# Patient Record
Sex: Male | Born: 1950
Health system: Southern US, Community
[De-identification: ages and names within clinical notes are randomized; demographics above are authoritative.]

## PROBLEM LIST (undated history)

## (undated) ENCOUNTER — Ambulatory Visit (HOSPITAL_COMMUNITY): Discharge status: Absent without leave | Payer: Medicare Other

## (undated) DIAGNOSIS — E7800 Pure hypercholesterolemia, unspecified: Secondary | ICD-10-CM

## (undated) DIAGNOSIS — Z9289 Personal history of other medical treatment: Secondary | ICD-10-CM

## (undated) DIAGNOSIS — I10 Essential (primary) hypertension: Secondary | ICD-10-CM

## (undated) DIAGNOSIS — K219 Gastro-esophageal reflux disease without esophagitis: Secondary | ICD-10-CM

## (undated) DIAGNOSIS — S92351A Displaced fracture of fifth metatarsal bone, right foot, initial encounter for closed fracture: Secondary | ICD-10-CM

## (undated) DIAGNOSIS — F0390 Unspecified dementia without behavioral disturbance: Secondary | ICD-10-CM

## (undated) DIAGNOSIS — F200 Paranoid schizophrenia: Secondary | ICD-10-CM

## (undated) DIAGNOSIS — N4289 Other specified disorders of prostate: Secondary | ICD-10-CM

## (undated) DIAGNOSIS — N400 Enlarged prostate without lower urinary tract symptoms: Secondary | ICD-10-CM

## (undated) DIAGNOSIS — K635 Polyp of colon: Secondary | ICD-10-CM

## (undated) DIAGNOSIS — B1920 Unspecified viral hepatitis C without hepatic coma: Secondary | ICD-10-CM

## (undated) DIAGNOSIS — F329 Major depressive disorder, single episode, unspecified: Secondary | ICD-10-CM

## (undated) DIAGNOSIS — F32A Depression, unspecified: Secondary | ICD-10-CM

## (undated) DIAGNOSIS — H348192 Central retinal vein occlusion, unspecified eye, stable: Secondary | ICD-10-CM

## (undated) DIAGNOSIS — T148XXA Other injury of unspecified body region, initial encounter: Secondary | ICD-10-CM

## (undated) HISTORY — PX: TONSILLECTOMY: SUR1361

## (undated) HISTORY — DX: Major depressive disorder, single episode, unspecified: F32.9

## (undated) HISTORY — DX: Unspecified viral hepatitis C without hepatic coma: B19.20

## (undated) HISTORY — DX: Benign prostatic hyperplasia without lower urinary tract symptoms: N40.0

## (undated) HISTORY — DX: Depression, unspecified: F32.A

## (undated) HISTORY — DX: Essential (primary) hypertension: I10

## (undated) HISTORY — DX: Polyp of colon: K63.5

## (undated) HISTORY — DX: Central retinal vein occlusion, unspecified eye, stable: H34.8192

---
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
## 1998-07-24 ENCOUNTER — Inpatient Hospital Stay (HOSPITAL_COMMUNITY): Admission: AD | Admit: 1998-07-24 | Discharge: 1998-07-26 | Payer: Self-pay | Admitting: *Deleted

## 1999-07-25 ENCOUNTER — Emergency Department (HOSPITAL_COMMUNITY): Admission: EM | Admit: 1999-07-25 | Discharge: 1999-07-25 | Payer: Self-pay | Admitting: Emergency Medicine

## 1999-07-26 ENCOUNTER — Encounter: Payer: Self-pay | Admitting: Emergency Medicine

## 1999-08-12 ENCOUNTER — Emergency Department (HOSPITAL_COMMUNITY): Admission: EM | Admit: 1999-08-12 | Discharge: 1999-08-12 | Payer: Self-pay | Admitting: Emergency Medicine

## 1999-10-06 ENCOUNTER — Encounter: Payer: Self-pay | Admitting: Emergency Medicine

## 1999-10-06 ENCOUNTER — Emergency Department (HOSPITAL_COMMUNITY): Admission: EM | Admit: 1999-10-06 | Discharge: 1999-10-06 | Payer: Self-pay | Admitting: *Deleted

## 2002-03-12 ENCOUNTER — Emergency Department (HOSPITAL_COMMUNITY): Admission: EM | Admit: 2002-03-12 | Discharge: 2002-03-12 | Payer: Self-pay | Admitting: *Deleted

## 2002-05-29 ENCOUNTER — Emergency Department (HOSPITAL_COMMUNITY): Admission: EM | Admit: 2002-05-29 | Discharge: 2002-05-29 | Payer: Self-pay | Admitting: Emergency Medicine

## 2002-05-31 ENCOUNTER — Emergency Department (HOSPITAL_COMMUNITY): Admission: EM | Admit: 2002-05-31 | Discharge: 2002-05-31 | Payer: Self-pay | Admitting: Emergency Medicine

## 2002-06-13 ENCOUNTER — Emergency Department (HOSPITAL_COMMUNITY): Admission: EM | Admit: 2002-06-13 | Discharge: 2002-06-13 | Payer: Self-pay | Admitting: *Deleted

## 2002-07-22 ENCOUNTER — Emergency Department (HOSPITAL_COMMUNITY): Admission: EM | Admit: 2002-07-22 | Discharge: 2002-07-22 | Payer: Self-pay | Admitting: Emergency Medicine

## 2002-07-29 ENCOUNTER — Emergency Department (HOSPITAL_COMMUNITY): Admission: EM | Admit: 2002-07-29 | Discharge: 2002-07-29 | Payer: Self-pay | Admitting: Emergency Medicine

## 2002-08-06 ENCOUNTER — Emergency Department (HOSPITAL_COMMUNITY): Admission: EM | Admit: 2002-08-06 | Discharge: 2002-08-06 | Payer: Self-pay | Admitting: Emergency Medicine

## 2002-08-13 ENCOUNTER — Emergency Department (HOSPITAL_COMMUNITY): Admission: EM | Admit: 2002-08-13 | Discharge: 2002-08-13 | Payer: Self-pay | Admitting: Emergency Medicine

## 2002-08-26 ENCOUNTER — Emergency Department (HOSPITAL_COMMUNITY): Admission: EM | Admit: 2002-08-26 | Discharge: 2002-08-26 | Payer: Self-pay

## 2002-09-05 ENCOUNTER — Emergency Department (HOSPITAL_COMMUNITY): Admission: EM | Admit: 2002-09-05 | Discharge: 2002-09-05 | Payer: Self-pay | Admitting: Emergency Medicine

## 2002-09-09 ENCOUNTER — Emergency Department (HOSPITAL_COMMUNITY): Admission: EM | Admit: 2002-09-09 | Discharge: 2002-09-10 | Payer: Self-pay

## 2002-09-11 ENCOUNTER — Emergency Department (HOSPITAL_COMMUNITY): Admission: EM | Admit: 2002-09-11 | Discharge: 2002-09-11 | Payer: Self-pay | Admitting: Emergency Medicine

## 2002-09-13 ENCOUNTER — Emergency Department (HOSPITAL_COMMUNITY): Admission: EM | Admit: 2002-09-13 | Discharge: 2002-09-13 | Payer: Self-pay | Admitting: *Deleted

## 2002-09-13 ENCOUNTER — Encounter: Payer: Self-pay | Admitting: *Deleted

## 2002-09-14 ENCOUNTER — Emergency Department (HOSPITAL_COMMUNITY): Admission: EM | Admit: 2002-09-14 | Discharge: 2002-09-14 | Payer: Self-pay | Admitting: Emergency Medicine

## 2002-09-19 ENCOUNTER — Emergency Department (HOSPITAL_COMMUNITY): Admission: EM | Admit: 2002-09-19 | Discharge: 2002-09-19 | Payer: Self-pay | Admitting: Emergency Medicine

## 2002-10-09 ENCOUNTER — Emergency Department (HOSPITAL_COMMUNITY): Admission: EM | Admit: 2002-10-09 | Discharge: 2002-10-09 | Payer: Self-pay | Admitting: Emergency Medicine

## 2002-10-18 ENCOUNTER — Emergency Department (HOSPITAL_COMMUNITY): Admission: EM | Admit: 2002-10-18 | Discharge: 2002-10-18 | Payer: Self-pay

## 2002-10-24 ENCOUNTER — Emergency Department (HOSPITAL_COMMUNITY): Admission: EM | Admit: 2002-10-24 | Discharge: 2002-10-24 | Payer: Self-pay | Admitting: Emergency Medicine

## 2002-12-20 ENCOUNTER — Emergency Department (HOSPITAL_COMMUNITY): Admission: EM | Admit: 2002-12-20 | Discharge: 2002-12-20 | Payer: Self-pay | Admitting: Emergency Medicine

## 2004-12-18 ENCOUNTER — Emergency Department (HOSPITAL_COMMUNITY): Admission: EM | Admit: 2004-12-18 | Discharge: 2004-12-18 | Payer: Self-pay | Admitting: Emergency Medicine

## 2004-12-20 ENCOUNTER — Emergency Department (HOSPITAL_COMMUNITY): Admission: EM | Admit: 2004-12-20 | Discharge: 2004-12-20 | Payer: Self-pay | Admitting: Emergency Medicine

## 2004-12-21 ENCOUNTER — Emergency Department (HOSPITAL_COMMUNITY): Admission: EM | Admit: 2004-12-21 | Discharge: 2004-12-21 | Payer: Self-pay | Admitting: Emergency Medicine

## 2004-12-22 ENCOUNTER — Emergency Department (HOSPITAL_COMMUNITY): Admission: EM | Admit: 2004-12-22 | Discharge: 2004-12-22 | Payer: Self-pay | Admitting: Emergency Medicine

## 2004-12-23 ENCOUNTER — Emergency Department (HOSPITAL_COMMUNITY): Admission: EM | Admit: 2004-12-23 | Discharge: 2004-12-23 | Payer: Self-pay | Admitting: Emergency Medicine

## 2004-12-25 ENCOUNTER — Emergency Department (HOSPITAL_COMMUNITY): Admission: EM | Admit: 2004-12-25 | Discharge: 2004-12-25 | Payer: Self-pay

## 2004-12-26 ENCOUNTER — Emergency Department (HOSPITAL_COMMUNITY): Admission: EM | Admit: 2004-12-26 | Discharge: 2004-12-26 | Payer: Self-pay | Admitting: *Deleted

## 2004-12-29 ENCOUNTER — Emergency Department (HOSPITAL_COMMUNITY): Admission: EM | Admit: 2004-12-29 | Discharge: 2004-12-29 | Payer: Self-pay | Admitting: Emergency Medicine

## 2005-01-04 ENCOUNTER — Emergency Department (HOSPITAL_COMMUNITY): Admission: EM | Admit: 2005-01-04 | Discharge: 2005-01-04 | Payer: Self-pay | Admitting: Emergency Medicine

## 2005-01-06 ENCOUNTER — Emergency Department (HOSPITAL_COMMUNITY): Admission: EM | Admit: 2005-01-06 | Discharge: 2005-01-06 | Payer: Self-pay | Admitting: Emergency Medicine

## 2005-01-17 ENCOUNTER — Emergency Department (HOSPITAL_COMMUNITY): Admission: EM | Admit: 2005-01-17 | Discharge: 2005-01-18 | Payer: Self-pay | Admitting: Emergency Medicine

## 2005-01-26 ENCOUNTER — Emergency Department (HOSPITAL_COMMUNITY): Admission: EM | Admit: 2005-01-26 | Discharge: 2005-01-26 | Payer: Self-pay | Admitting: Emergency Medicine

## 2005-02-04 ENCOUNTER — Emergency Department (HOSPITAL_COMMUNITY): Admission: EM | Admit: 2005-02-04 | Discharge: 2005-02-05 | Payer: Self-pay | Admitting: Emergency Medicine

## 2005-02-10 ENCOUNTER — Emergency Department (HOSPITAL_COMMUNITY): Admission: EM | Admit: 2005-02-10 | Discharge: 2005-02-10 | Payer: Self-pay | Admitting: Emergency Medicine

## 2005-02-20 ENCOUNTER — Emergency Department (HOSPITAL_COMMUNITY): Admission: EM | Admit: 2005-02-20 | Discharge: 2005-02-20 | Payer: Self-pay | Admitting: Emergency Medicine

## 2005-02-21 ENCOUNTER — Emergency Department (HOSPITAL_COMMUNITY): Admission: EM | Admit: 2005-02-21 | Discharge: 2005-02-21 | Payer: Self-pay | Admitting: Emergency Medicine

## 2005-02-22 ENCOUNTER — Emergency Department (HOSPITAL_COMMUNITY): Admission: EM | Admit: 2005-02-22 | Discharge: 2005-02-22 | Payer: Self-pay | Admitting: Emergency Medicine

## 2005-03-03 ENCOUNTER — Emergency Department (HOSPITAL_COMMUNITY): Admission: EM | Admit: 2005-03-03 | Discharge: 2005-03-03 | Payer: Self-pay | Admitting: Advanced Practice Midwife

## 2005-03-12 ENCOUNTER — Emergency Department (HOSPITAL_COMMUNITY): Admission: EM | Admit: 2005-03-12 | Discharge: 2005-03-12 | Payer: Self-pay | Admitting: Emergency Medicine

## 2005-03-13 ENCOUNTER — Emergency Department (HOSPITAL_COMMUNITY): Admission: EM | Admit: 2005-03-13 | Discharge: 2005-03-14 | Payer: Self-pay | Admitting: Emergency Medicine

## 2005-03-21 ENCOUNTER — Emergency Department (HOSPITAL_COMMUNITY): Admission: EM | Admit: 2005-03-21 | Discharge: 2005-03-22 | Payer: Self-pay | Admitting: Emergency Medicine

## 2005-04-03 ENCOUNTER — Emergency Department (HOSPITAL_COMMUNITY): Admission: EM | Admit: 2005-04-03 | Discharge: 2005-04-04 | Payer: Self-pay | Admitting: Emergency Medicine

## 2005-04-14 ENCOUNTER — Emergency Department (HOSPITAL_COMMUNITY): Admission: EM | Admit: 2005-04-14 | Discharge: 2005-04-15 | Payer: Self-pay | Admitting: Emergency Medicine

## 2005-06-16 ENCOUNTER — Emergency Department (HOSPITAL_COMMUNITY): Admission: EM | Admit: 2005-06-16 | Discharge: 2005-06-16 | Payer: Self-pay | Admitting: Emergency Medicine

## 2005-09-07 ENCOUNTER — Emergency Department (HOSPITAL_COMMUNITY): Admission: EM | Admit: 2005-09-07 | Discharge: 2005-09-07 | Payer: Self-pay | Admitting: Emergency Medicine

## 2005-09-09 ENCOUNTER — Emergency Department (HOSPITAL_COMMUNITY): Admission: EM | Admit: 2005-09-09 | Discharge: 2005-09-09 | Payer: Self-pay | Admitting: Emergency Medicine

## 2005-09-18 ENCOUNTER — Emergency Department (HOSPITAL_COMMUNITY): Admission: EM | Admit: 2005-09-18 | Discharge: 2005-09-18 | Payer: Self-pay | Admitting: Emergency Medicine

## 2005-09-22 ENCOUNTER — Emergency Department (HOSPITAL_COMMUNITY): Admission: EM | Admit: 2005-09-22 | Discharge: 2005-09-22 | Payer: Self-pay | Admitting: Emergency Medicine

## 2005-09-23 ENCOUNTER — Emergency Department (HOSPITAL_COMMUNITY): Admission: EM | Admit: 2005-09-23 | Discharge: 2005-09-24 | Payer: Self-pay | Admitting: Emergency Medicine

## 2005-09-27 ENCOUNTER — Emergency Department (HOSPITAL_COMMUNITY): Admission: EM | Admit: 2005-09-27 | Discharge: 2005-09-28 | Payer: Self-pay | Admitting: Emergency Medicine

## 2005-09-29 ENCOUNTER — Emergency Department (HOSPITAL_COMMUNITY): Admission: EM | Admit: 2005-09-29 | Discharge: 2005-09-29 | Payer: Self-pay | Admitting: Emergency Medicine

## 2005-10-05 ENCOUNTER — Emergency Department (HOSPITAL_COMMUNITY): Admission: EM | Admit: 2005-10-05 | Discharge: 2005-10-06 | Payer: Self-pay | Admitting: Emergency Medicine

## 2005-10-06 ENCOUNTER — Emergency Department (HOSPITAL_COMMUNITY): Admission: EM | Admit: 2005-10-06 | Discharge: 2005-10-06 | Payer: Self-pay | Admitting: Emergency Medicine

## 2005-10-07 ENCOUNTER — Inpatient Hospital Stay (HOSPITAL_COMMUNITY): Admission: EM | Admit: 2005-10-07 | Discharge: 2005-10-08 | Payer: Self-pay | Admitting: Emergency Medicine

## 2005-10-13 ENCOUNTER — Emergency Department (HOSPITAL_COMMUNITY): Admission: EM | Admit: 2005-10-13 | Discharge: 2005-10-13 | Payer: Self-pay | Admitting: Emergency Medicine

## 2005-10-15 ENCOUNTER — Emergency Department (HOSPITAL_COMMUNITY): Admission: EM | Admit: 2005-10-15 | Discharge: 2005-10-15 | Payer: Self-pay | Admitting: Emergency Medicine

## 2005-10-25 ENCOUNTER — Emergency Department (HOSPITAL_COMMUNITY): Admission: EM | Admit: 2005-10-25 | Discharge: 2005-10-26 | Payer: Self-pay | Admitting: Emergency Medicine

## 2005-10-28 ENCOUNTER — Emergency Department (HOSPITAL_COMMUNITY): Admission: EM | Admit: 2005-10-28 | Discharge: 2005-10-28 | Payer: Self-pay | Admitting: Emergency Medicine

## 2005-10-30 ENCOUNTER — Emergency Department (HOSPITAL_COMMUNITY): Admission: EM | Admit: 2005-10-30 | Discharge: 2005-10-31 | Payer: Self-pay | Admitting: Emergency Medicine

## 2005-10-30 ENCOUNTER — Emergency Department (HOSPITAL_COMMUNITY): Admission: EM | Admit: 2005-10-30 | Discharge: 2005-10-30 | Payer: Self-pay | Admitting: Emergency Medicine

## 2005-11-19 ENCOUNTER — Emergency Department (HOSPITAL_COMMUNITY): Admission: EM | Admit: 2005-11-19 | Discharge: 2005-11-19 | Payer: Self-pay | Admitting: Emergency Medicine

## 2005-11-20 ENCOUNTER — Emergency Department (HOSPITAL_COMMUNITY): Admission: EM | Admit: 2005-11-20 | Discharge: 2005-11-20 | Payer: Self-pay | Admitting: Emergency Medicine

## 2006-01-26 ENCOUNTER — Emergency Department (HOSPITAL_COMMUNITY): Admission: EM | Admit: 2006-01-26 | Discharge: 2006-01-26 | Payer: Self-pay | Admitting: Emergency Medicine

## 2006-02-15 ENCOUNTER — Emergency Department (HOSPITAL_COMMUNITY): Admission: EM | Admit: 2006-02-15 | Discharge: 2006-02-15 | Payer: Self-pay | Admitting: Emergency Medicine

## 2006-10-03 ENCOUNTER — Emergency Department (HOSPITAL_COMMUNITY): Admission: EM | Admit: 2006-10-03 | Discharge: 2006-10-04 | Payer: Self-pay | Admitting: Emergency Medicine

## 2006-10-04 ENCOUNTER — Emergency Department (HOSPITAL_COMMUNITY): Admission: EM | Admit: 2006-10-04 | Discharge: 2006-10-05 | Payer: Self-pay | Admitting: Emergency Medicine

## 2006-10-21 ENCOUNTER — Emergency Department (HOSPITAL_COMMUNITY): Admission: EM | Admit: 2006-10-21 | Discharge: 2006-10-22 | Payer: Self-pay | Admitting: Emergency Medicine

## 2007-03-10 ENCOUNTER — Encounter: Admission: RE | Admit: 2007-03-10 | Discharge: 2007-03-10 | Payer: Self-pay | Admitting: Internal Medicine

## 2008-08-17 DIAGNOSIS — K635 Polyp of colon: Secondary | ICD-10-CM

## 2008-08-17 HISTORY — DX: Polyp of colon: K63.5

## 2008-09-18 ENCOUNTER — Encounter: Payer: Self-pay | Admitting: Family Medicine

## 2008-11-17 ENCOUNTER — Emergency Department (HOSPITAL_COMMUNITY): Admission: EM | Admit: 2008-11-17 | Discharge: 2008-11-17 | Payer: Self-pay | Admitting: Emergency Medicine

## 2008-12-14 ENCOUNTER — Encounter (INDEPENDENT_AMBULATORY_CARE_PROVIDER_SITE_OTHER): Payer: Self-pay | Admitting: *Deleted

## 2008-12-14 DIAGNOSIS — Z8619 Personal history of other infectious and parasitic diseases: Secondary | ICD-10-CM | POA: Insufficient documentation

## 2008-12-14 DIAGNOSIS — B1710 Acute hepatitis C without hepatic coma: Secondary | ICD-10-CM

## 2008-12-14 DIAGNOSIS — F329 Major depressive disorder, single episode, unspecified: Secondary | ICD-10-CM | POA: Insufficient documentation

## 2009-01-03 ENCOUNTER — Telehealth (INDEPENDENT_AMBULATORY_CARE_PROVIDER_SITE_OTHER): Payer: Self-pay | Admitting: *Deleted

## 2009-01-12 ENCOUNTER — Emergency Department (HOSPITAL_COMMUNITY): Admission: EM | Admit: 2009-01-12 | Discharge: 2009-01-12 | Payer: Self-pay | Admitting: Emergency Medicine

## 2009-01-17 ENCOUNTER — Encounter: Payer: Self-pay | Admitting: Family Medicine

## 2009-01-17 ENCOUNTER — Ambulatory Visit: Payer: Self-pay | Admitting: Family Medicine

## 2009-01-17 DIAGNOSIS — I10 Essential (primary) hypertension: Secondary | ICD-10-CM | POA: Insufficient documentation

## 2009-01-17 DIAGNOSIS — F200 Paranoid schizophrenia: Secondary | ICD-10-CM | POA: Insufficient documentation

## 2009-01-17 DIAGNOSIS — N400 Enlarged prostate without lower urinary tract symptoms: Secondary | ICD-10-CM | POA: Insufficient documentation

## 2009-01-17 DIAGNOSIS — Z716 Tobacco abuse counseling: Secondary | ICD-10-CM | POA: Insufficient documentation

## 2009-01-17 HISTORY — DX: Paranoid schizophrenia: F20.0

## 2009-01-17 LAB — CONVERTED CEMR LAB
ALT: 16 units/L (ref 0–53)
AST: 26 units/L (ref 0–37)
Albumin: 4 g/dL (ref 3.5–5.2)
Alkaline Phosphatase: 51 units/L (ref 39–117)
BUN: 15 mg/dL (ref 6–23)
CO2: 25 meq/L (ref 19–32)
Calcium: 9.2 mg/dL (ref 8.4–10.5)
Chloride: 106 meq/L (ref 96–112)
Creatinine, Ser: 1.36 mg/dL (ref 0.40–1.50)
Glucose, Bld: 81 mg/dL (ref 70–99)
HCT: 42.4 % (ref 39.0–52.0)
HCV Ab: REACTIVE — AB
Hemoglobin: 14.4 g/dL (ref 13.0–17.0)
Hep A IgM: NEGATIVE
Hep B C IgM: NEGATIVE
Hepatitis B Surface Ag: NEGATIVE
MCHC: 34 g/dL (ref 30.0–36.0)
MCV: 87.1 fL (ref 78.0–100.0)
Platelets: 137 10*3/uL — ABNORMAL LOW (ref 150–400)
Potassium: 4.1 meq/L (ref 3.5–5.3)
RBC: 4.87 M/uL (ref 4.22–5.81)
RDW: 14.6 % (ref 11.5–15.5)
Sodium: 143 meq/L (ref 135–145)
Total Bilirubin: 0.8 mg/dL (ref 0.3–1.2)
Total Protein: 7.1 g/dL (ref 6.0–8.3)
WBC: 6.8 10*3/uL (ref 4.0–10.5)

## 2009-01-22 ENCOUNTER — Telehealth: Payer: Self-pay | Admitting: Family Medicine

## 2009-03-13 ENCOUNTER — Encounter: Payer: Self-pay | Admitting: Family Medicine

## 2009-03-22 ENCOUNTER — Ambulatory Visit: Payer: Self-pay | Admitting: Family Medicine

## 2009-04-17 ENCOUNTER — Encounter: Payer: Self-pay | Admitting: *Deleted

## 2009-04-17 ENCOUNTER — Ambulatory Visit: Payer: Self-pay | Admitting: Family Medicine

## 2009-04-18 ENCOUNTER — Encounter: Payer: Self-pay | Admitting: *Deleted

## 2009-05-09 ENCOUNTER — Encounter: Payer: Self-pay | Admitting: Family Medicine

## 2009-05-14 ENCOUNTER — Emergency Department (HOSPITAL_COMMUNITY): Admission: EM | Admit: 2009-05-14 | Discharge: 2009-05-14 | Payer: Self-pay | Admitting: Emergency Medicine

## 2009-05-20 ENCOUNTER — Telehealth: Payer: Self-pay | Admitting: *Deleted

## 2009-06-17 ENCOUNTER — Encounter: Payer: Self-pay | Admitting: Family Medicine

## 2009-07-10 ENCOUNTER — Encounter: Payer: Self-pay | Admitting: Family Medicine

## 2009-10-28 ENCOUNTER — Encounter: Payer: Self-pay | Admitting: Family Medicine

## 2009-10-28 ENCOUNTER — Ambulatory Visit: Payer: Self-pay | Admitting: Family Medicine

## 2009-10-28 LAB — CONVERTED CEMR LAB
ALT: 25 units/L (ref 0–53)
AST: 35 units/L (ref 0–37)
Albumin: 4.4 g/dL (ref 3.5–5.2)
Alkaline Phosphatase: 42 units/L (ref 39–117)
BUN: 12 mg/dL (ref 6–23)
CO2: 30 meq/L (ref 19–32)
Calcium: 9.2 mg/dL (ref 8.4–10.5)
Chloride: 100 meq/L (ref 96–112)
Cholesterol: 154 mg/dL (ref 0–200)
Creatinine, Ser: 1.3 mg/dL (ref 0.40–1.50)
Glucose, Bld: 70 mg/dL (ref 70–99)
HDL: 47 mg/dL (ref >39)
LDL Cholesterol: 94 mg/dL (ref 0–99)
Potassium: 3.8 meq/L (ref 3.5–5.3)
Sodium: 140 meq/L (ref 135–145)
Total Bilirubin: 0.7 mg/dL (ref 0.3–1.2)
Total CHOL/HDL Ratio: 3.3
Total Protein: 7 g/dL (ref 6.0–8.3)
Triglycerides: 65 mg/dL (ref <150)
VLDL: 13 mg/dL (ref 0–40)

## 2009-10-29 ENCOUNTER — Encounter: Payer: Self-pay | Admitting: Family Medicine

## 2009-11-25 ENCOUNTER — Encounter: Payer: Self-pay | Admitting: Family Medicine

## 2010-01-24 ENCOUNTER — Emergency Department (HOSPITAL_COMMUNITY): Admission: EM | Admit: 2010-01-24 | Discharge: 2010-01-24 | Payer: Self-pay | Admitting: Emergency Medicine

## 2010-01-24 ENCOUNTER — Telehealth: Payer: Self-pay | Admitting: Family Medicine

## 2010-01-28 ENCOUNTER — Emergency Department (HOSPITAL_COMMUNITY): Admission: EM | Admit: 2010-01-28 | Discharge: 2010-01-28 | Payer: Self-pay | Admitting: Family Medicine

## 2010-04-07 ENCOUNTER — Emergency Department (HOSPITAL_COMMUNITY): Admission: EM | Admit: 2010-04-07 | Discharge: 2010-04-07 | Payer: Self-pay | Admitting: Family Medicine

## 2010-05-29 ENCOUNTER — Ambulatory Visit: Payer: Self-pay | Admitting: Family Medicine

## 2010-05-29 ENCOUNTER — Encounter: Payer: Self-pay | Admitting: Family Medicine

## 2010-05-29 LAB — CONVERTED CEMR LAB
Albumin/Creatinine Ratio, Urine, POC: <30
Creatinine,U: 200 mg/dL
Microalbumin U total vol: 10 mg/L

## 2010-05-30 ENCOUNTER — Telehealth: Payer: Self-pay | Admitting: *Deleted

## 2010-05-30 ENCOUNTER — Encounter: Payer: Self-pay | Admitting: Family Medicine

## 2010-05-30 LAB — CONVERTED CEMR LAB
ALT: 19 units/L (ref 0–53)
AST: 35 units/L (ref 0–37)
Albumin: 4.6 g/dL (ref 3.5–5.2)
Alkaline Phosphatase: 49 units/L (ref 39–117)
BUN: 21 mg/dL (ref 6–23)
CO2: 28 meq/L (ref 19–32)
Calcium: 8.9 mg/dL (ref 8.4–10.5)
Chloride: 103 meq/L (ref 96–112)
Creatinine, Ser: 1.69 mg/dL — ABNORMAL HIGH (ref 0.40–1.50)
Glucose, Bld: 35 mg/dL — CL (ref 70–99)
Potassium: 3.9 meq/L (ref 3.5–5.3)
Sodium: 142 meq/L (ref 135–145)
Total Bilirubin: 1.1 mg/dL (ref 0.3–1.2)
Total Protein: 7.2 g/dL (ref 6.0–8.3)

## 2010-06-02 ENCOUNTER — Telehealth: Payer: Self-pay | Admitting: Family Medicine

## 2010-06-03 DIAGNOSIS — H348392 Tributary (branch) retinal vein occlusion, unspecified eye, stable: Secondary | ICD-10-CM | POA: Insufficient documentation

## 2010-06-04 ENCOUNTER — Encounter: Payer: Self-pay | Admitting: Family Medicine

## 2010-06-04 ENCOUNTER — Ambulatory Visit: Payer: Self-pay | Admitting: Family Medicine

## 2010-06-05 ENCOUNTER — Telehealth: Payer: Self-pay | Admitting: *Deleted

## 2010-06-05 LAB — CONVERTED CEMR LAB
BUN: 16 mg/dL (ref 6–23)
CO2: 27 meq/L (ref 19–32)
Calcium: 8.9 mg/dL (ref 8.4–10.5)
Chloride: 105 meq/L (ref 96–112)
Creatinine, Ser: 1.39 mg/dL (ref 0.40–1.50)
Glucose, Bld: 76 mg/dL (ref 70–99)
Potassium: 4.4 meq/L (ref 3.5–5.3)
Sodium: 139 meq/L (ref 135–145)

## 2010-06-19 ENCOUNTER — Encounter: Payer: Self-pay | Admitting: Family Medicine

## 2010-07-21 ENCOUNTER — Encounter: Payer: Self-pay | Admitting: Family Medicine

## 2010-07-21 ENCOUNTER — Ambulatory Visit: Payer: Self-pay

## 2010-08-04 ENCOUNTER — Ambulatory Visit: Payer: Self-pay | Admitting: Family Medicine

## 2010-08-13 ENCOUNTER — Emergency Department (HOSPITAL_COMMUNITY)
Admission: EM | Admit: 2010-08-13 | Discharge: 2010-08-13 | Payer: Self-pay | Source: Home / Self Care | Admitting: Family Medicine

## 2010-08-27 ENCOUNTER — Ambulatory Visit
Admission: RE | Admit: 2010-08-27 | Discharge: 2010-08-27 | Payer: Self-pay | Source: Home / Self Care | Attending: Family Medicine | Admitting: Family Medicine

## 2010-08-27 DIAGNOSIS — N390 Urinary tract infection, site not specified: Secondary | ICD-10-CM | POA: Insufficient documentation

## 2010-08-27 LAB — CONVERTED CEMR LAB
Bilirubin Urine: NEGATIVE
Blood in Urine, dipstick: NEGATIVE
Glucose, Urine, Semiquant: NEGATIVE
Ketones, urine, test strip: NEGATIVE
Nitrite: NEGATIVE
Protein, U semiquant: NEGATIVE
Specific Gravity, Urine: 1.01
Urobilinogen, UA: 0.2
WBC Urine, dipstick: NEGATIVE
pH: 5.5

## 2010-09-16 NOTE — Assessment & Plan Note (Signed)
Summary: FU/KH   Vital Signs:  Patient profile:   59 year old male Height:      68.5 inches Weight:      161.7 pounds Pulse rate:   65 / minute BP sitting:   120 / 80  (right arm)  Vitals Entered By: Arlyss Repress CMA, (April 17, 2009 1:32 PM) CC: f/up per dr.[place]. pt states 'have no problems. my liver is fine' Is Patient Diabetic? No Pain Assessment Patient in pain? no        Primary Care Provider:  [name]  CC:  f/up per dr.Henrietta. pt states 'have no problems. my liver is fine'.  History of Present Illness:  60 y.o. male recently released from prison for crack/cocaine use and ?? check fraud, history of HTN PTDS, Paranoid Schizophrenia, and ? Hepatitis   1. HTN- Last visit Lisinopril held secondary to hypotensive episodes. Per report BP systolic 120's offf medication. No Chest pain, no headaches, no dizziness, no SOB   2. Tobacco- smokes 1 pack over 3 days cutting back slowly not ready to quit all together but decreasing amounts  3. Prevention- has not had colonscopy secondary to incarceration  Habits & Providers  Alcohol-Tobacco-Diet     Tobacco Status: current     Tobacco Counseling: to quit use of tobacco products  Current Medications (verified): 1)  Hydrochlorothiazide 25 Mg Tabs (Hydrochlorothiazide) .... Take 1 Tablet By Mouth Once Daily 2)  Flomax 0.4 Mg Xr24h-Cap (Tamsulosin Hcl) .Marland Kitchen.. 1 By Mouth Daily 3)  Cogentin 1 Mg/ml Soln (Benztropine Mesylate) 4)  Prolixin D 12.5mg  Im Injection .Marland Kitchen.. 1 Injection Q 2weeks Per Behavioral Health  Allergies (verified): 1)  ! Penicillin  Review of Systems       neg except per HPI  Physical Exam  General:  Well-developed,well-nourished,in no acute distress; alert,appropriate and cooperative throughout examination Vital signs noted  Lungs:  Normal respiratory effort, chest expands symmetrically. Lungs are clear to auscultation, with short expiratory wheeze. nml WOB, no retractions Heart:  Normal rate  and regular rhythm. S1 and S2 normal without gallop, murmur, click, rub or other extra sounds. Pulses:  2+ Extremities:  No edema   Impression & Recommendations:  Problem # 1:  HYPERTENSION, BENIGN ESSENTIAL (ICD-401.1) Assessment Improved  BP stable off Lisinopril no indication for use, no history of DM or renal disease. Continue with HCTZ, flomax for BPH but also alpha blocker The following medications were removed from the medication list:    Lisinopril 10 Mg Tabs (Lisinopril) .Marland Kitchen... Take 1 tablet by mouth once daily His updated medication list for this problem includes:    Hydrochlorothiazide 25 Mg Tabs (Hydrochlorothiazide) .Marland Kitchen... Take 1 tablet by mouth once daily  Orders: [facility]- Est Level  3 ([address])  Problem # 2:  TOBACCO ABUSE (ICD-305.1) Assessment: Unchanged Continue to ask about tobacco use, pt not ready to quit yet, although decreasing use Orders: [facility]- Est Level  3 ([address])  Complete Medication List: 1)  Hydrochlorothiazide 25 Mg Tabs (Hydrochlorothiazide) .... Take 1 tablet by mouth once daily 2)  Flomax 0.4 Mg Xr24h-cap (Tamsulosin hcl) .Marland Kitchen.. 1 by mouth daily 3)  Cogentin 1 Mg/ml Soln (Benztropine mesylate) 4)  Prolixin D 12.5mg  Im Injection  .Marland Kitchen.. 1 injection q 2weeks per behavioral health  Other Orders: Gastroenterology Referral (GI)  Patient Instructions: 1)  Continue taking the HCTZ and Flomax. 2)  Do not take anymore Lisinopril 3)  We will set you up for Colonscopy 4)  I want you to see Hep C  clinic 5)  I want you come back 6 months

## 2010-09-16 NOTE — Progress Notes (Signed)
Summary: rx req  Phone Note Other Incoming Call back at [phone]   Caller: [facility] [address]  Summary of Call: pt needs rxs filled Hydrochorothlazide 25 mgs daily and lisinopril 10mg  called into [facility]. Initial call taken by: Clydell Hakim,  Jan 03, 2009 9:25 AM  Follow-up for Phone Call        Spoke with Elnita Maxwell at [facility]  Advised that as pt has not been seen here yet (1st appt is 01/17/09), pt should call prescribing physician for lisinopril and hctz to rx 2 weeks worth of these meds.  After pt is seen here we can begin rxing his meds.  Elnita Maxwell states she will give pt the message. Follow-up by: Jacki Cones RN,  Jan 03, 2009 9:57 AM

## 2010-09-16 NOTE — Letter (Signed)
Summary: Lab-Male  All     ,     Phone:   Fax:     10/29/2009        [name] [address]  [place], [place]  [address]   Dear Mr. Kluck:  You had a lipid profile (blood fat analysis) performed on 10/28/2009.       Your laboratory results are included below. Four separate fat components are reported.    Your triglyceride is: 65 (should be less than 150). Your triglyceride level is normal.   Your total cholesterol is: 154 (should be less than 200).  HDL cholesterol is the good cholesterol; higher levels are better. Your HDL is: 47.   Levels under 40 result in higher risk of heart attack. HDL levels greater than 60 reduce your risk.  Regular exercise raise this level.  Tobacco use lowers HDL levels.      Finally, LDL cholesterol is BAD cholesterol. Your LDL is: 94. (should be less than 100).   Your Complete Metabolic Panel was normal. This tested your electrolytes, your kidney function and your liver proteins.  The above lab results are normal. I will send a copy to Mental Health   If you have any questions, please call. We appreciate being able to work with you.    Sincerely,   [name] Typed by: [name]  Appended Document: Lab-Male mailed.

## 2010-09-16 NOTE — Progress Notes (Signed)
Summary: triage  Phone Note Call from Patient Call back at Home Phone [phone]   Caller: Patient Summary of Call: Pt thinks he has a uti. Initial call taken by: Clydell Hakim,  January 24, 2010 1:39 PM  Follow-up for Phone Call        thur or Fri last week had a burning sensation with urination, then went away, irratation and burning started again on Monday, no problem yesterday or today, denies urinary freg, no blood noted, advised to go to UC due to no apt's available today.  Explained that if UTI he need to be treated so that it does not go to kidneys, voiced understand and stated he would go to UC this evening.  Stated sexually active with his wife only and that it would not be STD related.  Follow-up by: Gladstone Pih,  January 24, 2010 2:02 PM

## 2010-09-16 NOTE — Miscellaneous (Signed)
Summary: new pt info  Clinical Lists Changes  Problems: Added new problem of DEPRESSION (ICD-311) Added new problem of HEPATITIS C (ICD-070.51) Added new problem of FAMILY HISTORY OF CAD MALE 1ST DEGREE RELATIVE <50 (ICD-V17.3) Medications: Added new medication of LISINOPRIL 10 MG TABS (LISINOPRIL) take 1 tablet by mouth once daily Added new medication of HYDROCHLOROTHIAZIDE 25 MG TABS (HYDROCHLOROTHIAZIDE) take 1 tablet by mouth once daily Allergies: Added new allergy or adverse reaction of PENICILLIN Observations: Added new observation of FH PREM CAD: Family History of CAD Male 1st degree relative <50 (12/14/2008 11:19) Added new observation of FAMILY HX: Family History Hypertension Family History of Stroke F 1st degree relative <60- mother Family History of CAD Male 1st degree relative <50- father (12/14/2008 11:19) Added new observation of FH STROKE: Family History of Stroke F 1st degree relative <60 (12/14/2008 11:19) Added new observation of FH HTN: Family History Hypertension (12/14/2008 11:19) Added new observation of STD: no risk noted (12/14/2008 11:19) Added new observation of HIV RSK EVAL: no risk noted (12/14/2008 11:19) Added new observation of SEXUAL ORIEN: Heterosexual (12/14/2008 11:19) Added new observation of SEATBELT USE: yes (12/14/2008 11:19) Added new observation of SUN EXPOSURE: no (12/14/2008 11:19) Added new observation of PRESNT RESID: Renting (12/14/2008 11:19) Added new observation of TRANSPORTACC: Public Transportation (12/14/2008 11:19) Added new observation of EDUCA LEVEL: College (12/14/2008 11:19) Added new observation of PT OCCUPAT: None (12/14/2008 11:19) Added new observation of ETHNICITY: Black (12/14/2008 11:19) Added new observation of SOCIAL HX: Lives with wife, currently unemployeed.  Enjoys going to the park.  Smokes 1/2 pack of cigarettes per day, quit using cocaine 3 years ago, no alcohol.  Walks daily for 1 hour. (12/14/2008 11:19) Added  new observation of PAST SURG HX: Tonsillectomy  (12/14/2008 11:19) Added new observation of TONSILLECTOM: yes (12/14/2008 11:19) Added new observation of PAST MED HX: Depression Hepatitis C Right eye blindness since February 2010 (12/14/2008 11:19) Added new observation of HEPATITISCHX: yes (12/14/2008 11:19) Added new observation of DEPRESSION: yes (12/14/2008 11:19) Added new observation of NKA: F (12/14/2008 11:19)       Past History:  Past Medical History:    Depression    Hepatitis C    Right eye blindness since February 2010  Past Surgical History:    Tonsillectomy   Family History:    Family History Hypertension    Family History of Stroke F 1st degree relative <60- mother    Family History of CAD Male 1st degree relative <50- father  Social History:    Lives with wife, currently unemployeed.  Enjoys going to the park.  Smokes 1/2 pack of cigarettes per day, quit using cocaine 3 years ago, no alcohol.  Walks daily for 1 hour.    Ethnicity:  Black    Occupation:  None    Education:  Therapist, nutritional:  Therapist, music    Residence:  Education officer, community Exposure-Excessive:  no    Risk analyst Use:  yes    Sex Orientation:  Heterosexual    HIV Risk:  no risk noted    STD Risk:  no risk noted  Pt also listed Benzotropin Meslate 1 mg at at bedtime daily and Prolixin Dec on med list, but unable to find these meds in centricity.  Lalitha Ilyas Daphine Deutscher RN  December 14, 2008 11:53 AM

## 2010-09-16 NOTE — Assessment & Plan Note (Signed)
Summary: BP check  Nurse Visit    In for BP check. BP checked manually using regular adult cuff. BP LA 130/84. pulse 60. patient had labs drawn today. advised that MD will contact him regarding lab results and follow up on BP. advised to continue off HCTZ  for now . phone numbers to use in addition to number listed above  [phone] or [phone]. Theresia Lo RN  June 04, 2010 2:40 PM   Allergies: 1)  ! Penicillin  Orders Added: 1)  No Charge Patient Arrived (NCPA0) [NCPA0]

## 2010-09-16 NOTE — Assessment & Plan Note (Signed)
Summary: f/up,tcb   Vital Signs:  Patient profile:   60 year old male Height:      68.5 inches Weight:      159 pounds BMI:     23.91 Temp:     98.2 degrees F oral Pulse rate:   67 / minute BP sitting:   125 / 77  (left arm) Cuff size:   regular  Vitals Entered By: Tessie Fass CMA (May 29, 2010 1:35 PM) CC: F/U HTN,Meds  Is Patient Diabetic? No Pain Assessment Patient in pain? no        Primary Care Provider:  [name]  CC:  F/U HTN and Meds .  History of Present Illness:     1. HTN- Taking HTCZ daily no concerns, feels thirsty, no chest pain no headaches   2. Tobacco- smokes 1/4ppd, contemplating quiting, cutting back   3. BPH- still taking flomax , no trouble starting urine 90% of the time   4. Hepatitis C clinic- thinks he went to an appt, not sure , may need it rescheduled for Tues/Thurs  5. Schizophrenia- still being followed by mental health- [name] and [name] , Cogentin and Prilixam  - no recent illegal activity per report, no drug abuse  Still working at [employer], taking classes at [employer] - at [employer]   Habits & Providers  Alcohol-Tobacco-Diet     Tobacco Status: current     Tobacco Counseling: to quit use of tobacco products     Cigarette Packs/Day: 0.25  Current Medications (verified): 1)  Hydrochlorothiazide 25 Mg Tabs (Hydrochlorothiazide) .... Take 1 Tablet By Mouth Once Daily 2)  Flomax 0.4 Mg Xr24h-Cap (Tamsulosin Hcl) .Marland Kitchen.. 1 By Mouth Daily 3)  Cogentin 1 Mg/ml Soln (Benztropine Mesylate) 4)  Prolixin D 12.5mg  Im Injection .Marland Kitchen.. 1 Injection Q 2weeks Per Behavioral Health  Allergies (verified): 1)  ! 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  Enjoys going to the park.  Smokes 1/2 pack of cigarettes per day for 30 years,  now down to 1/4ppd quit using cocaine 3 years ago, social alcohol dringer.  Walks daily for 1 hour. 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 Perrla. Fundscopic difficult to visualize Right funuds completely, arcus senilus bilat Lungs:  Normal respiratory effort, chest expands symmetrically. Lungs are clear to auscultation, with short expiratory wheeze. nml WOB, no retractions Heart:  RRR, no murmur  Psych:  Oriented X3, memory intact for recent and remote, normally interactive, good eye contact, and not depressed appearing.   No apparrent delusions/hallucinations Denies SI   Impression & Recommendations:  Problem # 1:  HYPERTENSION, BENIGN ESSENTIAL (ICD-401.1) Assessment Unchanged  His updated medication list for this problem includes:    Hydrochlorothiazide 25 Mg Tabs (Hydrochlorothiazide) .Marland Kitchen... Take 1 tablet by mouth once daily  Orders: Comp Met-[facility] ([address]) UA Microalbumin-[facility] ([address]) [facility]- Est  Level 4 ([address])  Problem # 2:  BENIGN PROSTATIC HYPERTROPHY, WITH URINARY OBSTRUCTION (ICD-600.01) Assessment: Improved  continue flomax  Orders: [facility]- Est  Level 4 ([address])  Problem # 3:  HEPATITIS C (ICD-070.51) Assessment:  Unchanged  Check LFT needs f/u with GI, pt confused on whether or not he went to appt, will try to get  Orders: [facility]- Est  Level 4 ([address])  Problem # 4:  PARANOID SCHIZOPHRENIA, CHRONIC (ICD-295.32) Assessment: Unchanged  labs today, otherwise per mental health  Orders: [facility]- Est  Level 4 ([address])  Complete Medication List: 1)  Hydrochlorothiazide 25 Mg Tabs (Hydrochlorothiazide) .... Take 1 tablet by mouth once daily 2)   Flomax 0.4 Mg Xr24h-cap (Tamsulosin hcl) .Marland Kitchen.. 1 by mouth daily 3)  Cogentin 1 Mg/ml Soln (Benztropine mesylate) 4)  Prolixin D 12.5mg  Im Injection  .Marland Kitchen.. 1 injection q 2weeks per behavioral health  Patient Instructions: 1)  Next visit in 6 months 2)  We will check and see if you had your appt for the Hep C clinic 3)  Follow up with your other doctors as presccribed 4)  I have refilled your medications 5)  We will call you or send a letter with your lab results Prescriptions: FLOMAX 0.4 MG XR24H-CAP (TAMSULOSIN HCL) 1 by mouth daily  #30 x 6   Entered and Authorized by:   [name]   Signed by:   [name] on 05/29/2010   Method used:   Electronically to        [facility] (retail)       [address]       [place], [place]  [id number]       Ph: [phone]       Fax: [phone]   RxID:   [id number] HYDROCHLOROTHIAZIDE 25 MG TABS (HYDROCHLOROTHIAZIDE) take 1 tablet by mouth once daily  #30 x 6   Entered and Authorized by:   [name]   Signed by:   [name] on 05/29/2010   Method used:   Electronically to        [facility] (retail)       [address]       [place], [place]  [id number]       Ph: [phone]       Fax: [phone]   RxID:   [id number]   Laboratory Results   Urine Tests  Date/Time Received: May 29, 2010 2:17 PM  Date/Time Reported: May 29, 2010 2:25 PM   Microalbumin (urine): 10 mg/L Creatinine: 200mg /dL  A:C Ratio <20 Normal Comments: ...........test performed by...........Marland KitchenTerese Door, CMA       Prevention & Chronic Care Immunizations   Influenza vaccine: Not documented    Tetanus booster: Not documented    Pneumococcal vaccine: Not documented  Colorectal Screening   Hemoccult: Not documented   Hemoccult due: Not Indicated    Colonoscopy: abnormal- polyp  (07/10/2009)   Colonoscopy due: 07/10/2014  Other Screening   PSA: Not  documented   PSA due due: Not Indicated   Smoking status: current  (05/29/2010)  Lipids   Total Cholesterol: 154  (10/28/2009)   LDL: 94  (10/28/2009)   LDL Direct: Not documented   HDL: 47  (10/28/2009)   Triglycerides: 65  (10/28/2009)  Hypertension   Last Blood Pressure: 125 / 77  (05/29/2010)   Serum creatinine: 1.30  (10/28/2009)   Serum potassium 3.8  (10/28/2009) CMP ordered     Hypertension flowsheet reviewed?: Yes   Progress toward BP goal: At goal  Self-Management Support :   Personal Goals (by the next clinic visit) :      Personal blood pressure goal: 140/90  (10/28/2009)   Hypertension  self-management support: Not documented

## 2010-09-16 NOTE — Consult Note (Signed)
Summary: [facility]- Assessment  [facility]- Assessment   Imported By: De Nurse 06/26/2010 10:32:52  _____________________________________________________________________  External Attachment:    Type:   Image     Comment:   External Document

## 2010-09-16 NOTE — Miscellaneous (Signed)
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  Smokes 1/2 pack of cigarettes per day for 30 years,  now down to 1/4ppd quit using cocaine 3 years ago, social alcohol drinker  Walks daily for 1 hour. 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  (06/19/2010 10:45) Added new observation of PAST MED HX: Depression Hepatitis C Branched  Retinal Vein Occlusion secondary to HTN- Right EYE - [facility] HTN PTSD Paronoid Schizophrenia- G[employer] 61 (previous meds Clozaril (stopped secondary to  leukopenia),risperdal,Trilafon, Depakote, Zyprexa -- multiple hospitlizations for Schizophrenia last 2005 Relased from prison Feb 2010 - prior prison record 2006-2007 for crack/cocaine, repeat 2008-2010 for check fraud BPH h/o polysubstance abuse- alcohol, cocaine (06/19/2010 10:45)      Past Medical History:    Depression    Hepatitis C    Branched  Retinal Vein Occlusion secondary to HTN- Right EYE - [name]    HTN    PTSD    Paronoid Schizophrenia- [employer] 61 (previous meds Clozaril (stopped secondary to  leukopenia),risperdal,Trilafon, Depakote, Zyprexa -- multiple hospitlizations for Schizophrenia last 2005    Relased from prison Feb 2010 - prior prison record 2006-2007 for crack/cocaine, repeat 2008-2010 for check fraud    BPH    h/o polysubstance abuse- alcohol, cocaine   Social History: Lives with wife, 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  Smokes 1/2 pack of cigarettes per day  for 30 years,  now down to 1/4ppd quit using cocaine 3 years ago, social alcohol drinker  Walks daily for 1 hour. 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

## 2010-09-16 NOTE — Miscellaneous (Signed)
Summary: [facility] GI appt  Clinical Lists Changes    [facility] GI/Hepatology appt  [address] [phone]   Fax [phone] Aptt 01/09/10 at 10:30am

## 2010-09-16 NOTE — Assessment & Plan Note (Signed)
Summary: cpe,tcb   Vital Signs:  Patient profile:   60 year old male Height:      68.5 inches Weight:      162.3 pounds BMI:     24.41 Temp:     97.9 degrees F oral Pulse rate:   61 / minute Pulse rhythm:   regular BP sitting:   129 / 90  Vitals Entered By: Loralee Pacas CMA (October 28, 2009 1:25 PM)  Primary Care Provider:  [name]  CC:  CPE.  History of Present Illness:      60 y.o. male recently released from prison for crack/cocaine use and ?? check fraud, history of HTN PTDS, Paranoid Schizophrenia, and ? Hepatitis   1. HTN- Last visit Lisinopril stopped secondary hypotensive episodes. Per report BP systolic 120's on both medications No Chest pain, no headaches, + dizziness when taking both blood pressure , no SOB   - Restarted the lisinopril because blood pressure was 130/80. Plan to see eye doctor next week  2. Tobacco- smokes 1/2ppd, contemplating quiting  3. Prevention- colonscopy done- repeat in 5, adenomatous polyp found  4. BPH- still taking flomax   5. Hepatitis C clinic- missed his appt, would like to go.  5. Schizophrenia- still being followed by mental health, Cogentin and Prilixam  - no recent illegal activity per report, no drug abuse  Current Medications (verified): 1)  Hydrochlorothiazide 25 Mg Tabs (Hydrochlorothiazide) .... Take 1 Tablet By Mouth Once Daily 2)  Flomax 0.4 Mg Xr24h-Cap (Tamsulosin Hcl) .Marland Kitchen.. 1 By Mouth Daily 3)  Cogentin 1 Mg/ml Soln (Benztropine Mesylate) 4)  Prolixin D 12.5mg  Im Injection .Marland Kitchen.. 1 Injection Q 2weeks Per Behavioral Health  Allergies (verified): 1)  ! 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  Smokes 1/2 pack of cigarettes per day for 30 years, quit using cocaine 3 years ago, social alcohol dringer.  Walks daily for 1 hour. 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 Perrla. Funduscopic exam benign, without hemorrhages, exudates or papilledema.  Ears:  External ear exam shows no significant lesions or deformities.  Otoscopic examination reveals clear canals, tympanic membranes are intact bilaterally without bulging, retraction, inflammation or discharge. Hearing is grossly normal bilaterally. Mouth:  Oral mucosa and oropharynx without lesions or exudates.   Abdomen:  Bowel sounds positive,abdomen soft and non-tender without masses,  Pulses:  2+ Extremities:  No edema Neurologic:  alert & oriented X3, cranial nerves II-XII intact, and strength normal in all extremities.   Psych:  Oriented X3, memory intact for recent and remote, normally interactive, good eye contact, and not depressed appearing.   No apparrent delusions/hallucinations Denies SI   Impression & Recommendations:  Problem # 1:  HYPERTENSION, BENIGN ESSENTIAL (ICD-401.1) Assessment Unchanged Pt symptomatic when Lisinopril on board, was to D/C unsure why he restarted, reiterated goal bp < 140/90, see instructions below His updated medication list for this problem includes:    Hydrochlorothiazide 25 Mg Tabs (Hydrochlorothiazide) .Marland Kitchen... Take 1 tablet by mouth once daily  Orders: Lipid-[facility] ([address]) Comp Met-[facility] ([address]-91478) [facility] - Est  40-64 yrs ([address])  Problem # 2:  BENIGN PROSTATIC HYPERTROPHY, WITH URINARY OBSTRUCTION (ICD-600.01) Assessment: Unchanged  Continue flomax  Orders: [facility] - Est  40-64 yrs ([address])  Problem # 3:  HEPATITIS C (ICD-070.51) Assessment: Unchanged Check LFT will refer to Hep C clinic again, missed appt Orders: Comp Met-[facility] ([address]-09811) Hepatitis C Clinic Referral (HepC) [facility] - Est  40-64 yrs ([address]78)  Problem # 4:  TOBACCO ABUSE (ICD-305.1) Assessment: Unchanged  Given  handout for smoking cessation classes here at clinic  Orders: [facility] - Est  40-64 yrs ([address])  Complete Medication List: 1)  Hydrochlorothiazide 25 Mg Tabs (Hydrochlorothiazide) .... Take 1 tablet by mouth once daily 2)  Flomax 0.4 Mg Xr24h-cap (Tamsulosin hcl) .Marland Kitchen.. 1 by mouth daily 3)  Cogentin 1 Mg/ml Soln (Benztropine mesylate) 4)  Prolixin D 12.5mg  Im Injection  .Marland Kitchen.. 1 injection q 2weeks per behavioral health  Patient Instructions: 1)  For your blood pressure  Continue taking the HCTZ  2)  Try to record your blood pressure once a week and bring to next visit 3)  Do not take anymore Lisinopril 4)  I will refer you to Hep C clinic 5)  I will send you a letter with your lab results 6)  Next visit  6 months  7)  When you are ready to quit smoking let me know 8)  We have free smoking classes Prescriptions: FLOMAX 0.4 MG XR24H-CAP (TAMSULOSIN HCL) 1 by mouth daily  #30 x 6   Entered and Authorized by:   [name]   Signed by:   [name] on 10/28/2009   Method used:   Electronically to        [facility] (retail)       [address]       [place], [place]  [address]       Ph: [phone]       Fax: [phone]   RxID:   [id number] HYDROCHLOROTHIAZIDE 25 MG TABS (HYDROCHLOROTHIAZIDE) take 1 tablet by mouth once daily  #30 x 6   Entered and Authorized by:   [name]   Signed by:   [name] on 10/28/2009   Method used:   Electronically to        [facility] (retail)       [address]       [place], [place]  [address]       Ph: [phone]       Fax: [phone]   RxID:   [id number]    Prevention & Chronic Care Immunizations   Influenza vaccine: Not documented    Tetanus booster: Not documented    Pneumococcal vaccine: Not documented  Colorectal Screening   Hemoccult: Not documented   Hemoccult due: Not Indicated    Colonoscopy: abnormal- polyp  (07/10/2009)    Colonoscopy due: 07/10/2014  Other Screening   PSA: Not documented   PSA due due: Not Indicated   Smoking status: current  (04/17/2009)  Lipids   Total Cholesterol: Not documented   LDL: Not documented   LDL Direct: Not documented   HDL: Not documented   Triglycerides: Not documented  Hypertension   Last Blood Pressure: 129 / 90  (10/28/2009)   Serum creatinine: 1.36  (01/17/2009)   Serum potassium 4.1  (01/17/2009) CMP ordered     Hypertension flowsheet reviewed?: Yes   Progress toward BP goal: At goal  Self-Management Support :   Personal Goals (by the next clinic visit) :      Personal blood pressure goal: 140/90  (10/28/2009)   Hypertension self-management support: Not  documented     Flex Sig Next Due:  Not Indicated Colonoscopy Result Date:  07/10/2009 Colonoscopy Result:  abnormal- polyp Colonoscopy Next Due:  5 yr Hemoccult Next Due:  Not Indicated PSA Next Due:  Not Indicated  Appended Document: cpe,tcb Additional V70 code for CPE done on 10/28/09 Noted prevention information and chronic disease progression in orginal note

## 2010-09-16 NOTE — Miscellaneous (Signed)
Summary: Orders Update  Clinical Lists Changes  Problems: Added new problem of RENAL INSUFFICIENCY, ACUTE (ICD-585.9) Orders: Added new Test order of Basic Met-[facility] [phone]) - Signed

## 2010-09-16 NOTE — Progress Notes (Signed)
  Phone Note Outgoing Call   Call placed by: [name],  January 22, 2009 11:43 AM Details for Reason: Lab Results- Hep C Positive  Summary of Call: Pt given lab results. HIV neg, RPR neg, Renal function -wnl, Hep panel positive for Hep C which is known however pt has never had any follow-up besides occasional visits with PCP > 6 years ago. Will refer to Hepatitis Clinic in [place], based on reccomendations it is borderline whether or not patient will qualify for any treatment. Will defer to Hep Clinic for final decision, pt agreeable to plan.

## 2010-09-16 NOTE — Progress Notes (Signed)
----   Converted from flag ---- ---- 06/05/2010 12:29 PM, [name] wrote: Please let pt know his labs came back normal for his kidneys. Make sure he does not take the Hydrocholorthiazide until I discuss this with him next week ------------------------------  called and spoke with pt's wife. told her that his labs were normal and to stop the hctz until his visit next week with [place].

## 2010-09-16 NOTE — Miscellaneous (Signed)
Summary: Hep C Clinic/TS  Clinical Lists Changes called Hep C Clinic @ [phone] and LMAM to call us back. (They are closed today). We have faxed referral for pt on 03-25-09. Arlyss Repress CMA,  April 17, 2009 1:40 PM

## 2010-09-16 NOTE — Letter (Signed)
Summary: Out of School  [facility]  [address]   [place], [place] [address]   Phone: [phone]  Fax: [phone]    July 21, 2010   Student:  [name]    To Whom It May Concern:   For Medical reasons, please excuse the above named student from school for the following dates: [name] was seen in my clinic this am for continued medical follow-up. Please excuse him from any missed classes. He will need a follow-up appointment in 2 weeks.   Start:   July 21, 2010  End:    July 21, 2010  If you need additional information, please feel free to contact our office.   Sincerely,    [name]    ****This is a legal document and cannot be tampered with.  Schools are authorized to verify all information and to do so accordingly.

## 2010-09-16 NOTE — Progress Notes (Signed)
Summary: RE: HEP C CLINIC/TS- multiple DNKA  ---- Converted from flag ---- ---- 05/29/2010 9:59 PM, [name] wrote: Can we call the HEP C clinic on [address] for records, if patient has not been seen then does he need a new referral ------------------------------ called the HEP C Clinic. Pt dnka 01-09-10 and also 05-30-09. DNKA x 2. Spoke with Tobi Bastos and she said, that they usually do not schedule another appt after one no show. Pt did not even cancel his appt's.Marland KitchenMarland KitchenThey loose at [address] and they have many patients to see. Their waiting list is up to 10-2010.  called pt and left message with wife to call us back. Pt needs to sched. OV with Dr.[place] and discuss issue. He may have a chance to be seen there again, after he sees Dr.Spofford and is really commited to keep his appointment.  fwd. to Dr.Cecil..Thekla Lorenda Hatchet CMA,  June 02, 2010 11:41 AM   I will discuss at next office visit [name]  June 02, 2010 4:14 PM

## 2010-09-16 NOTE — Consult Note (Signed)
Summary: [facility]  [facility]   Imported By: Clydell Hakim 03/19/2009 16:05:00  _____________________________________________________________________  External Attachment:    Type:   Image     Comment:   External Document

## 2010-09-16 NOTE — Progress Notes (Signed)
Summary: re: hep c clinic/ts  ---- Converted from flag ---- ---- 04/29/2009 9:28 AM, Modesta Messing LPN wrote: I called the Hep C clinic and they will call us back with his appt info.  Okey Regal  ---- 04/26/2009 4:02 PM, Arlyss Repress CMA, wrote: Okey Regal, could you please just f/up on this pt ? just needed to know, if they did sched. appt for pt in hep c clinic? [phone]. they should have all the info. thanks and have a good week......................Emet Rafanan ------------------------------

## 2010-09-16 NOTE — Assessment & Plan Note (Signed)
Summary: needs meds,df   Vital Signs:  Patient profile:   60 year old male Height:      68.5 inches Weight:      163 pounds BMI:     24.51 Pulse rate:   84 / minute BP sitting:   149 / 97  (left arm) Cuff size:   regular  Vitals Entered By: Tessie Fass CMA (July 21, 2010 9:29 AM) CC: Rx refil BP meds Is Patient Diabetic? No Pain Assessment Patient in pain? no        Primary Care Provider:  [name]  CC:  Rx refil BP meds.  History of Present Illness: Pt here to f/u HTN Stopped HCTZ secondary to ARF with Cr 1.69, has been off meds approx 1 month, did not follow-up with appointments, feels more tired now that his blood pressure is up feels "sick" but can not explain No Chest pain, no SOB  Disussed HEP C clinic follow-up needed, but pt needs to committ to going to appt, he is willing to have treatment if indicated  Habits & Providers  Alcohol-Tobacco-Diet     Tobacco Status: current     Tobacco Counseling: to quit use of tobacco products     Cigarette Packs/Day: 0.5  Current Medications (verified): 1)  Flomax 0.4 Mg Xr24h-Cap (Tamsulosin Hcl) .Marland Kitchen.. 1 By Mouth Daily 2)  Cogentin 1 Mg/ml Soln (Benztropine Mesylate) 3)  Prolixin D 12.5mg  Im Injection .Marland Kitchen.. 1 Injection Q 2weeks Per Behavioral Health 4)  Norvasc 10 Mg Tabs (Amlodipine Besylate) .Marland Kitchen.. 1 By Mouth Daily For Blood Pressure  Allergies (verified): 1)  ! Penicillin  Social History: Packs/Day:  0.5  Review of Systems       Per HPI  Physical Exam  General:  Well-developed,well-nourished,in no acute distress; alert,appropriate and cooperative throughout examination Vital signs noted  Lungs:  CTAB Heart:  RRR, no murmur  Pulses:  2+ Extremities:  No edema   Impression & Recommendations:  Problem # 1:  HYPERTENSION, BENIGN ESSENTIAL (ICD-401.1) Assessment Deteriorated  Will start CCB and rtc 2 weeks, secondary to ARF with HCTZ, no precipiating factors known The following medications  were removed from the medication list:    Hydrochlorothiazide 25 Mg Tabs (Hydrochlorothiazide) .Marland Kitchen... Take 1 tablet by mouth once daily His updated medication list for this problem includes:    Norvasc 10 Mg Tabs (Amlodipine besylate) .Marland Kitchen... 1 by mouth daily for blood pressure  BP today: 149/97 Prior BP: 125/77 (05/29/2010)  Labs Reviewed: K+: 4.4 (06/04/2010) Creat: : 1.39 (06/04/2010)   Chol: 154 (10/28/2009)   HDL: 47 (10/28/2009)   LDL: 94 (10/28/2009)   TG: 65 (10/28/2009)  Orders: [facility]- Est  Level 4 ([address])  Problem # 2:  HEPATITIS C (ICD-070.51) Assessment: Unchanged  Discussed at length with pt, willing to f/u will send another referral, last set of LFT wnl  Orders: Hepatitis C Clinic Referral (HepC)  Complete Medication List: 1)  Flomax 0.4 Mg Xr24h-cap (Tamsulosin hcl) .Marland Kitchen.. 1 by mouth daily 2)  Cogentin 1 Mg/ml Soln (Benztropine mesylate) 3)  Prolixin D 12.5mg  Im Injection  .Marland Kitchen.. 1 injection q 2weeks per behavioral health 4)  Norvasc 10 Mg Tabs (Amlodipine besylate) .Marland Kitchen.. 1 by mouth daily for blood pressure  Other Orders: Influenza Vaccine NON MCR ([address])  Patient Instructions: 1)  Start the Norvasc 10mg  daily  2)  Next visit in 2 weeks to f/u your blood pressure Monday the 19th in the pm  Prescriptions: NORVASC 10 MG TABS (AMLODIPINE BESYLATE) 1  by mouth daily for blood pressure  #30 x 3   Entered and Authorized by:   [name]   Signed by:   [name] on 07/21/2010   Method used:   Electronically to        [facility] (retail)       [address]       [place], [place]  [id number]       Ph: [phone]       Fax: [phone]   RxID:   [id number]    Orders Added: 1)  Influenza Vaccine NON MCR [00028] 2)  [facility]- Est  Level 4 [03474] 3)  Hepatitis C Clinic Referral [HepC]   Immunizations Administered:  Influenza Vaccine # 1:    Vaccine Type: Fluvax Non-MCR    Site: left deltoid    Mfr: GlaxoSmithKline     Dose: 0.5 ml    Route: IM    Given by: Arlyss Repress CMA,    Exp. Date: 02/14/2011    Lot #: [id number]    VIS given: 03/11/10 version given July 21, 2010.  Flu Vaccine Consent Questions:    Do you have a history of severe allergic reactions to this vaccine? no    Any prior history of allergic reactions to egg and/or gelatin? no    Do you have a sensitivity to the preservative Thimersol? no    Do you have a past history of Guillan-Barre Syndrome? no    Do you currently have an acute febrile illness? no    Have you ever had a severe reaction to latex? no    Vaccine information given and explained to patient? yes   Immunizations Administered:  Influenza Vaccine # 1:    Vaccine Type: Fluvax Non-MCR    Site: left deltoid    Mfr: GlaxoSmithKline    Dose: 0.5 ml    Route: IM    Given by: Arlyss Repress CMA,    Exp. Date: 02/14/2011    Lot #: [id number]    VIS given: 03/11/10 version given July 21, 2010.    Prevention & Chronic Care Immunizations   Influenza vaccine: Fluvax Non-MCR  (07/21/2010)    Tetanus booster: Not documented    Pneumococcal vaccine: Not documented  Colorectal Screening   Hemoccult: Not documented   Hemoccult due: Not Indicated    Colonoscopy: abnormal- polyp  (07/10/2009)   Colonoscopy due: 07/10/2014  Other Screening   PSA: Not documented   PSA due due: Not Indicated   Smoking status: current  (07/21/2010)  Lipids   Total Cholesterol: 154  (10/28/2009)   LDL: 94  (10/28/2009)   LDL Direct: Not documented   HDL: 47  (10/28/2009)   Triglycerides: 65  (10/28/2009)  Hypertension   Last Blood Pressure: 149 / 97  (07/21/2010)   Serum creatinine: 1.39  (06/04/2010)   Serum potassium 4.4  (06/04/2010)    Hypertension flowsheet reviewed?: Yes   Progress toward BP goal: Deteriorated  Self-Management Support :   Personal Goals (by the next clinic visit) :      Personal blood pressure goal: 140/90  (10/28/2009)   Hypertension  self-management support: Not documented

## 2010-09-16 NOTE — Progress Notes (Signed)
Summary: Labs, left voice message  Phone Note Outgoing Call   Call placed by: [name],  May 30, 2010 12:27 PM Details for Reason: Labs Summary of Call: Left a message for [name] to return call, he needs to stop his HCTZ he has some mild renal failure. I want him to stop the medication, he can continue the other medicines, come back to clinic next week to have his labs checked again and BP checked by the nurse.  Follow-up for Phone Call        Spoke with patient and informed him of message. He is going to call main number to make an appt for lab and bp check Follow-up by: Jimmy Footman, CMA,  May 30, 2010 2:24 PM
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
## 2010-09-17 ENCOUNTER — Encounter: Payer: Self-pay | Admitting: *Deleted

## 2010-09-18 NOTE — Assessment & Plan Note (Signed)
Summary: F/U  BP/KH   Vital Signs:  Patient profile:   60 year old male Height:      68.5 inches Weight:      163 pounds BMI:     24.51 Temp:     97.8 degrees F oral Pulse rate:   73 / minute BP sitting:   105 / 70  (right arm) Cuff size:   regular  Vitals Entered By: Tessie Fass CMA (August 04, 2010 1:52 PM) CC: F/U BP Pain Assessment Patient in pain? no        Primary Care Provider:  [name]  CC:  F/U BP.  History of Present Illness:   Pt here to f/u HTN Stopped HCTZ secondary to ARF Started taking Novasc  No Chest pain, no SOB, no leg swelling, HA , no dizziness   Has not missed any dosing   Eye doctor appt tomorrow evening  Current Medications (verified): 1)  Flomax 0.4 Mg Xr24h-Cap (Tamsulosin Hcl) .Marland Kitchen.. 1 By Mouth Daily 2)  Cogentin 1 Mg/ml Soln (Benztropine Mesylate) 3)  Prolixin D 12.5mg  Im Injection .Marland Kitchen.. 1 Injection Q 2weeks Per Behavioral Health 4)  Norvasc 10 Mg Tabs (Amlodipine Besylate) .Marland Kitchen.. 1 By Mouth Daily For Blood Pressure  Allergies (verified): 1)  ! Penicillin  Physical Exam  General:  Well-developed,well-nourished,in no acute distress; alert,appropriate and cooperative throughout examination Vital signs noted  Lungs:  normal wob Extremities:  No edema   Impression & Recommendations:  Problem # 1:  HYPERTENSION, BENIGN ESSENTIAL (ICD-401.1) Assessment Improved  Continue Norvasc, given red flags for hypotension His updated medication list for this problem includes:    Norvasc 10 Mg Tabs (Amlodipine besylate) .Marland Kitchen... 1 by mouth daily for blood pressure  Orders: [facility]- Est Level  3 (99213)  Complete Medication List: 1)  Flomax 0.4 Mg Xr24h-cap (Tamsulosin hcl) .Marland Kitchen.. 1 by mouth daily 2)  Cogentin 1 Mg/ml Soln (Benztropine mesylate) 3)  Prolixin D 12.5mg  Im Injection  .Marland Kitchen.. 1 injection q 2weeks per behavioral health 4)  Norvasc 10 Mg Tabs (Amlodipine besylate) .Marland Kitchen.. 1 by mouth daily for blood pressure  Patient  Instructions: 1)  Next visit in 3 months to follow up blood pressure 2)  If you have heard about hepatitis clinic in January then call me    Orders Added: 1)  [facility]- Est Level  3 [99213]     Prevention & Chronic Care Immunizations   Influenza vaccine: Fluvax Non-MCR  (07/21/2010)    Tetanus booster: Not documented    Pneumococcal vaccine: Not documented  Colorectal Screening   Hemoccult: Not documented   Hemoccult due: Not Indicated    Colonoscopy: abnormal- polyp  (07/10/2009)   Colonoscopy due: 07/10/2014  Other Screening   PSA: Not documented   PSA due due: Not Indicated   Smoking status: current  (07/21/2010)  Lipids   Total Cholesterol: 154  (10/28/2009)   LDL: 94  (10/28/2009)   LDL Direct: Not documented   HDL: 47  (10/28/2009)   Triglycerides: 65  (10/28/2009)  Hypertension   Last Blood Pressure: 105 / 70  (08/04/2010)   Serum creatinine: 1.39  (06/04/2010)   Serum potassium 4.4  (06/04/2010)    Hypertension flowsheet reviewed?: Yes   Progress toward BP goal: At goal  Self-Management Support :   Personal Goals (by the next clinic visit) :      Personal blood pressure goal: 140/90  (10/28/2009)   Hypertension self-management support: Not documented

## 2010-09-18 NOTE — Assessment & Plan Note (Signed)
Summary: f/u uti/eo   Vital Signs:  Patient profile:   60 year old male Weight:      161.5 pounds Temp:     97.6 degrees F oral Pulse rate:   64 / minute BP sitting:   116 / 76  (right arm)  Vitals Entered By: Arlyss Repress CMA, (August 27, 2010 9:12 AM) CC: dysuria x 1 week Is Patient Diabetic? No Pain Assessment Patient in pain? no        Primary Care Provider:  [name]  CC:  dysuria x 1 week.  History of Present Illness:    Pt seen at urgent care  for UTI given script for Cipro 500mg  two times a day x 10 days, completed meds yesterday   No fever, no abd pain, occ burning, no discharge from penis, no penile lesions, feels a lot better     Habits & Providers  Alcohol-Tobacco-Diet     Tobacco Status: current     Tobacco Counseling: to quit use of tobacco products     Cigarette Packs/Day: 1.0  Current Medications (verified): 1)  Flomax 0.4 Mg Xr24h-Cap (Tamsulosin Hcl) .Marland Kitchen.. 1 By Mouth Daily 2)  Cogentin 1 Mg/ml Soln (Benztropine Mesylate) 3)  Prolixin D 12.5mg  Im Injection .Marland Kitchen.. 1 Injection Q 2weeks Per Behavioral Health 4)  Norvasc 10 Mg Tabs (Amlodipine Besylate) .Marland Kitchen.. 1 By Mouth Daily For Blood Pressure  Allergies (verified): 1)  ! Penicillin  Social History: Packs/Day:  1.0  Review of Systems       per HPI  Physical Exam  General:  Well-developed,well-nourished,in no acute distress; alert,appropriate and cooperative throughout examination Vital signs noted  Abdomen:  soft, non-tender, normal bowel sounds, and no distention.   No CVA tenderness   Impression & Recommendations:  Problem # 1:  UTI (ICD-599.0) Assessment Improved  completed course of antibiotics appropirate in setting of BPH No further meds UA clean Given red flags If returns likley treat longer and check for prostattis  Orders: [facility]- Est Level  3 (11914)  Complete Medication List: 1)  Flomax 0.4 Mg Xr24h-cap (Tamsulosin hcl) .Marland Kitchen.. 1 by mouth daily 2)  Cogentin 1  Mg/ml Soln (Benztropine mesylate) 3)  Prolixin D 12.5mg  Im Injection  .Marland Kitchen.. 1 injection q 2weeks per behavioral health 4)  Norvasc 10 Mg Tabs (Amlodipine besylate) .Marland Kitchen.. 1 by mouth daily for blood pressure  Other Orders: Urinalysis-[facility] (00000)  Patient Instructions: 1)  Your urine infection has cleared 2)  If you still have any symptoms after the weekend then return for a recheck 3)  Next 3 months    Orders Added: 1)  Urinalysis-[facility] [00000] 2)  [facility]- Est Level  3 [99213]      Laboratory Results   Urine Tests  Date/Time Received: August 27, 2010 9:21 AM  Date/Time Reported: August 27, 2010 9:28 AM   Routine Urinalysis   Color: yellow Appearance: Clear Glucose: negative   (Normal Range: Negative) Bilirubin: negative   (Normal Range: Negative) Ketone: negative   (Normal Range: Negative) Spec. Gravity: 1.010   (Normal Range: 1.003-1.035) Blood: negative   (Normal Range: Negative) pH: 5.5   (Normal Range: 5.0-8.0) Protein: negative   (Normal Range: Negative) Urobilinogen: 0.2   (Normal Range: 0-1) Nitrite: negative   (Normal Range: Negative) Leukocyte Esterace: negative   (Normal Range: Negative)    Comments: .........Marland Kitchenbiochemical negative; microscopic not indicated ...............test performed by......Marland KitchenBonnie A. Swaz[place], MLS (ASCP)cm

## 2010-09-19 NOTE — Consult Note (Signed)
Summary: [facility]   Imported By: De Nurse 05/10/2009 11:21:47  _____________________________________________________________________  External Attachment:    Type:   Image     Comment:   External Document

## 2010-09-19 NOTE — Consult Note (Signed)
Summary: [facility]   Imported By: De Nurse 07/16/2009 15:04:34  _____________________________________________________________________  External Attachment:    Type:   Image     Comment:   External Document

## 2010-09-19 NOTE — Consult Note (Signed)
Summary: [facility]   Imported By: Clydell Hakim 06/03/2010 15:43:38  _____________________________________________________________________  External Attachment:    Type:   Image     Comment:   External Document  Appended Document: [facility]    Clinical Lists Changes  Problems: Added new problem of BRANCH RETINAL VEIN OCCLUSION (ICD-362.36) Observations: Added new observation of PAST MED HX: Depression Hepatitis C Branched  Retinal Vein Occlusion secondary to HTN- [facility] - [facility] HTN PTSD Paronoid Schizophrenia Relased from prison Feb 2010 - prior prison record 2006-2007 for crack/cocaine, repeat 2008-2010 for check fraud BPH (06/03/2010 18:43) Added new observation of DMEYEEXAMNXT: 08/2010 (06/03/2010 18:43) Added new observation of DM PROGRESS: N/A (06/03/2010 18:43) Added new observation of DM FSREVIEW: N/A (06/03/2010 18:43) Added new observation of LIPID PROGRS: N/A (06/03/2010 18:43) Added new observation of LIPID FSREVW: N/A (06/03/2010 18:43) Added new observation of DMEYEEXMRES: Branched Retnial Vein Occlusion (08/20/2009 18:47) Added new observation of EYE EXAM BY: [facility] (08/20/2009 18:47) Added new observation of DIAB EYE EX: Branched Retnial Vein Occlusion (08/20/2009 18:47) Added new observation of DMEYEEXMRES: Blocked Retnial Vein Occlusion (08/20/2009 18:45) Added new observation of EYE EXAM BY: [facility] (08/20/2009 18:45) Added new observation of DIAB EYE EX: Blocked Retnial Vein Occlusion (08/20/2009 18:45)       Prevention & Chronic Care Immunizations   Influenza vaccine: Not documented    Tetanus booster: Not documented    Pneumococcal vaccine: Not documented  Colorectal Screening   Hemoccult: Not documented   Hemoccult due: Not Indicated    Colonoscopy: abnormal- polyp  (07/10/2009)   Colonoscopy due: 07/10/2014  Other Screening   PSA: Not  documented   PSA due due: Not Indicated   Smoking status: current  (05/29/2010)  Lipids   Total Cholesterol: 154  (10/28/2009)   LDL: 94  (10/28/2009)   LDL Direct: Not documented   HDL: 47  (10/28/2009)   Triglycerides: 65  (10/28/2009)  Hypertension   Last Blood Pressure: 125 / 77  (05/29/2010)   Serum creatinine: 1.69  (05/29/2010)   Serum potassium 3.9  (05/29/2010)  Self-Management Support :   Personal Goals (by the next clinic visit) :      Personal blood pressure goal: 140/90  (10/28/2009)   Hypertension self-management support: Not documented    Past Medical History:    Depression    Hepatitis C    Branched  Retinal Vein Occlusion secondary to HTN- Right [facility] - [facility]    HTN    PTSD    Paronoid Schizophrenia    Relased from prison Feb 2010 - prior prison record 2006-2007 for crack/cocaine, repeat 2008-2010 for check fraud    BPH   Diabetes Management Exam:    Eye Exam:       Eye Exam done elsewhere          Date: 08/20/2009          Results: Branched Retnial Vein Occlusion          Done by: Constance Goltz

## 2010-10-27 LAB — POCT URINALYSIS DIPSTICK
Bilirubin Urine: NEGATIVE
Glucose, UA: NEGATIVE mg/dL
Hgb urine dipstick: NEGATIVE
Ketones, ur: NEGATIVE mg/dL
Nitrite: NEGATIVE
Protein, ur: NEGATIVE mg/dL
Specific Gravity, Urine: 1.015 (ref 1.005–1.030)
Urobilinogen, UA: 0.2 mg/dL (ref 0.0–1.0)
pH: 5.5 (ref 5.0–8.0)

## 2010-10-27 LAB — URINE CULTURE
Colony Count: NO GROWTH
Culture  Setup Time: 201112281908
Culture: NO GROWTH

## 2010-10-27 LAB — OCCULT BLOOD, POC DEVICE: Fecal Occult Bld: NEGATIVE

## 2010-10-27 LAB — GC/CHLAMYDIA PROBE AMP, GENITAL
Chlamydia, DNA Probe: NEGATIVE
GC Probe Amp, Genital: NEGATIVE

## 2010-11-03 LAB — POCT URINALYSIS DIP (DEVICE)
Bilirubin Urine: NEGATIVE
Bilirubin Urine: NEGATIVE
Glucose, UA: NEGATIVE mg/dL
Glucose, UA: NEGATIVE mg/dL
Hgb urine dipstick: NEGATIVE
Hgb urine dipstick: NEGATIVE
Ketones, ur: NEGATIVE mg/dL
Ketones, ur: NEGATIVE mg/dL
Nitrite: NEGATIVE
Nitrite: NEGATIVE
Protein, ur: NEGATIVE mg/dL
Protein, ur: NEGATIVE mg/dL
Specific Gravity, Urine: 1.02 (ref 1.005–1.030)
Specific Gravity, Urine: 1.025 (ref 1.005–1.030)
Urobilinogen, UA: 0.2 mg/dL (ref 0.0–1.0)
Urobilinogen, UA: 1 mg/dL (ref 0.0–1.0)
pH: 5.5 (ref 5.0–8.0)
pH: 6 (ref 5.0–8.0)

## 2010-11-03 LAB — GC/CHLAMYDIA PROBE AMP, GENITAL
Chlamydia, DNA Probe: NEGATIVE
GC Probe Amp, Genital: NEGATIVE

## 2010-11-04 ENCOUNTER — Encounter: Payer: Self-pay | Admitting: Family Medicine

## 2010-11-04 ENCOUNTER — Ambulatory Visit (INDEPENDENT_AMBULATORY_CARE_PROVIDER_SITE_OTHER): Payer: Medicaid Other | Admitting: Family Medicine

## 2010-11-04 VITALS — BP 108/72 | HR 78 | Temp 97.8°F | Ht 69.0 in | Wt 160.0 lb

## 2010-11-04 DIAGNOSIS — R5381 Other malaise: Secondary | ICD-10-CM

## 2010-11-04 DIAGNOSIS — N401 Enlarged prostate with lower urinary tract symptoms: Secondary | ICD-10-CM

## 2010-11-04 DIAGNOSIS — B1710 Acute hepatitis C without hepatic coma: Secondary | ICD-10-CM

## 2010-11-04 DIAGNOSIS — I10 Essential (primary) hypertension: Secondary | ICD-10-CM

## 2010-11-04 DIAGNOSIS — N138 Other obstructive and reflux uropathy: Secondary | ICD-10-CM

## 2010-11-04 DIAGNOSIS — R5383 Other fatigue: Secondary | ICD-10-CM | POA: Insufficient documentation

## 2010-11-04 LAB — CBC
HCT: 40.1 % (ref 39.0–52.0)
Hemoglobin: 13.7 g/dL (ref 13.0–17.0)
MCH: 28.7 pg (ref 26.0–34.0)
MCHC: 34.2 g/dL (ref 30.0–36.0)
MCV: 83.9 fL (ref 78.0–100.0)
Platelets: 138 10*3/uL — ABNORMAL LOW (ref 150–400)
RBC: 4.78 MIL/uL (ref 4.22–5.81)
RDW: 15.2 % (ref 11.5–15.5)
WBC: 6.3 10*3/uL (ref 4.0–10.5)

## 2010-11-04 LAB — COMPREHENSIVE METABOLIC PANEL
ALT: 33 U/L (ref 0–53)
AST: 47 U/L — ABNORMAL HIGH (ref 0–37)
Albumin: 4.2 g/dL (ref 3.5–5.2)
Alkaline Phosphatase: 48 U/L (ref 39–117)
BUN: 14 mg/dL (ref 6–23)
CO2: 25 mEq/L (ref 19–32)
Calcium: 9 mg/dL (ref 8.4–10.5)
Chloride: 106 mEq/L (ref 96–112)
Creat: 1.37 mg/dL (ref 0.40–1.50)
Glucose, Bld: 101 mg/dL — ABNORMAL HIGH (ref 70–99)
Potassium: 3.8 mEq/L (ref 3.5–5.3)
Sodium: 139 mEq/L (ref 135–145)
Total Bilirubin: 0.8 mg/dL (ref 0.3–1.2)
Total Protein: 6.8 g/dL (ref 6.0–8.3)

## 2010-11-04 NOTE — Assessment & Plan Note (Signed)
Currently at goal, continue Norvasc If pt continues to have fatigue consider decreasing to 5mg 

## 2010-11-04 NOTE — Assessment & Plan Note (Signed)
This is likely secondary to his change in sleep habits, will avoid caffiene, adjust bedtime and hopefully when pt starts his new job will have a better schedule to follow. Routine labs checked for his chronic medical conditions today.

## 2010-11-04 NOTE — Assessment & Plan Note (Signed)
Will f/u appt Check CMET today Informed him of importance once again for keeping appt
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
## 2010-11-04 NOTE — Assessment & Plan Note (Signed)
Unchanged, continue Flomax

## 2010-11-04 NOTE — Patient Instructions (Signed)
Next visit in 3 months (June)  Continue current medications Adjust- your sleep pattern try to get to sleep earlier, avoid daytime napping, decrease the caffeine(sodas) I will send a letter for your lab results, unless something is abnormal

## 2010-11-05 ENCOUNTER — Encounter: Payer: Self-pay | Admitting: Family Medicine

## 2010-11-05 LAB — LDL CHOLESTEROL, DIRECT: Direct LDL: 87 mg/dL

## 2010-11-06 ENCOUNTER — Telehealth: Payer: Self-pay | Admitting: *Deleted

## 2010-11-06 NOTE — Telephone Encounter (Signed)
Called Hep C Clinic and lmvm to call back. We need to know, if pt has upcoming appt. Dr.Fairfield spoke with them to give the pt one more chance to be seen. Pt dnka multiple times in past. Arlyss Repress

## 2010-11-17 ENCOUNTER — Other Ambulatory Visit: Payer: Self-pay | Admitting: Family Medicine

## 2010-11-17 ENCOUNTER — Telehealth: Payer: Self-pay | Admitting: Family Medicine

## 2010-11-17 NOTE — Telephone Encounter (Signed)
I called Hep C clinic and left a message, to see when is [name]'s appt time, they have not responded to our previous calls

## 2010-11-17 NOTE — Telephone Encounter (Signed)
refill request

## 2010-11-17 NOTE — Telephone Encounter (Signed)
Message copied by Milinda Antis on Mon Nov 17, 2010  3:15 PM ------      Message from: Arlyss Repress      Created: Tue Nov 11, 2010  3:35 PM      Regarding: RE: Hep C       I have called the Hep C Clinic and left a detailed message for them. I have talked with them in the past. Re: pt missed many appt's . Maybe the pt has to take the initiative and call them. They have all his information. Their number is: [phone]. Arlyss Repress            ----- Message -----         From: Milinda Antis         Sent: 11/11/2010   1:50 PM           To: Arlyss Repress      Subject: Hep C                                                    Did you here anything back from the Hep C clinic about his appt, if not do you have the number to the Hep C clinic      ----- Message -----         From: Milinda Antis         Sent: 11/04/2010   2:43 PM           To: Milinda Antis            F/u Hep C appt

## 2010-11-18 ENCOUNTER — Telehealth: Payer: Self-pay | Admitting: *Deleted

## 2010-11-18 NOTE — Telephone Encounter (Signed)
Tobi Bastos from [facility] [phone])  called to report that pt [facility] his appt with them. They will reschedule

## 2010-11-26 LAB — DIFFERENTIAL
Basophils Absolute: 0 10*3/uL (ref 0.0–0.1)
Basophils Relative: 0 % (ref 0–1)
Eosinophils Absolute: 0.1 10*3/uL (ref 0.0–0.7)
Eosinophils Relative: 1 % (ref 0–5)
Lymphocytes Relative: 36 % (ref 12–46)
Lymphs Abs: 2.2 10*3/uL (ref 0.7–4.0)
Monocytes Absolute: 0.5 10*3/uL (ref 0.1–1.0)
Monocytes Relative: 8 % (ref 3–12)
Neutro Abs: 3.4 10*3/uL (ref 1.7–7.7)
Neutrophils Relative %: 55 % (ref 43–77)

## 2010-11-26 LAB — COMPREHENSIVE METABOLIC PANEL
ALT: 38 U/L (ref 0–53)
AST: 41 U/L — ABNORMAL HIGH (ref 0–37)
Albumin: 3.4 g/dL — ABNORMAL LOW (ref 3.5–5.2)
Alkaline Phosphatase: 44 U/L (ref 39–117)
BUN: 13 mg/dL (ref 6–23)
CO2: 30 mEq/L (ref 19–32)
Calcium: 8 mg/dL — ABNORMAL LOW (ref 8.4–10.5)
Chloride: 103 mEq/L (ref 96–112)
Creatinine, Ser: 1.22 mg/dL (ref 0.4–1.5)
GFR calc Af Amer: >60 mL/min (ref >60)
GFR calc non Af Amer: >60 mL/min (ref >60)
Glucose, Bld: 92 mg/dL (ref 70–99)
Potassium: 3.7 mEq/L (ref 3.5–5.1)
Sodium: 138 mEq/L (ref 135–145)
Total Bilirubin: 0.8 mg/dL (ref 0.3–1.2)
Total Protein: 6.1 g/dL (ref 6.0–8.3)

## 2010-11-26 LAB — CBC
HCT: 41.7 % (ref 39.0–52.0)
Hemoglobin: 14 g/dL (ref 13.0–17.0)
MCHC: 33.4 g/dL (ref 30.0–36.0)
MCV: 90.7 fL (ref 78.0–100.0)
Platelets: 114 10*3/uL — ABNORMAL LOW (ref 150–400)
RBC: 4.6 MIL/uL (ref 4.22–5.81)
RDW: 14.1 % (ref 11.5–15.5)
WBC: 6.2 10*3/uL (ref 4.0–10.5)

## 2010-11-26 LAB — URINALYSIS, ROUTINE W REFLEX MICROSCOPIC
Bilirubin Urine: NEGATIVE
Glucose, UA: NEGATIVE mg/dL
Hgb urine dipstick: NEGATIVE
Ketones, ur: NEGATIVE mg/dL
Nitrite: NEGATIVE
Protein, ur: NEGATIVE mg/dL
Specific Gravity, Urine: 1.012 (ref 1.005–1.030)
Urobilinogen, UA: 4 mg/dL — ABNORMAL HIGH (ref 0.0–1.0)
pH: 6 (ref 5.0–8.0)

## 2010-11-26 LAB — LIPASE, BLOOD: Lipase: 50 U/L (ref 11–59)

## 2010-12-05 ENCOUNTER — Ambulatory Visit (INDEPENDENT_AMBULATORY_CARE_PROVIDER_SITE_OTHER): Payer: Medicaid Other | Admitting: Family Medicine

## 2010-12-05 ENCOUNTER — Encounter: Payer: Self-pay | Admitting: Family Medicine

## 2010-12-05 VITALS — BP 121/78 | HR 65 | Temp 97.4°F | Ht 68.0 in | Wt 159.8 lb

## 2010-12-05 DIAGNOSIS — B1710 Acute hepatitis C without hepatic coma: Secondary | ICD-10-CM

## 2010-12-05 NOTE — Patient Instructions (Signed)
Hepatitis C clinic- [phone] - Tobi Bastos  Next appt in June

## 2010-12-06 NOTE — Progress Notes (Signed)
  Subjective:    Patient ID: [name], male    DOB: 1951/03/14, 60 y.o.   MRN: [id number]  HPI  Pt presented to f/u liver, he was seen 2 weeks ago, there may have been some confusion regarding his appt, should have been seen in June. Reviewed labs with him  Hep Clinic referral is in process, nurse called 1 week ago, to reschedule him. Doing well, no complaints  Review of Systems     Objective:   Physical Exam    GEN- NAD, alert and oriented    Assessment & Plan:

## 2010-12-06 NOTE — Assessment & Plan Note (Signed)
Reviewed labs again Awaiting Hep clinic to reschedule Pt also given number to clinic to call, if he has not heard from them by next week.

## 2010-12-16 ENCOUNTER — Other Ambulatory Visit: Payer: Self-pay | Admitting: Family Medicine

## 2010-12-17 NOTE — Telephone Encounter (Signed)
Refill request

## 2010-12-25 ENCOUNTER — Encounter: Payer: Self-pay | Admitting: Family Medicine

## 2010-12-25 ENCOUNTER — Ambulatory Visit (INDEPENDENT_AMBULATORY_CARE_PROVIDER_SITE_OTHER): Payer: Medicaid Other | Admitting: Family Medicine

## 2010-12-25 VITALS — BP 121/79 | HR 80 | Temp 98.0°F | Ht 68.0 in | Wt 161.0 lb

## 2010-12-25 DIAGNOSIS — I10 Essential (primary) hypertension: Secondary | ICD-10-CM

## 2010-12-25 DIAGNOSIS — B1710 Acute hepatitis C without hepatic coma: Secondary | ICD-10-CM

## 2010-12-25 DIAGNOSIS — N401 Enlarged prostate with lower urinary tract symptoms: Secondary | ICD-10-CM

## 2010-12-25 DIAGNOSIS — N138 Other obstructive and reflux uropathy: Secondary | ICD-10-CM

## 2010-12-25 DIAGNOSIS — F17200 Nicotine dependence, unspecified, uncomplicated: Secondary | ICD-10-CM

## 2010-12-25 DIAGNOSIS — Z23 Encounter for immunization: Secondary | ICD-10-CM

## 2010-12-25 NOTE — Patient Instructions (Addendum)
You can start your exercise program When you are ready to quit smoking let us know, continue to cut back Today you received your tetanus shot We will send another referral for the liver doctor  Next visit in 3 months with new doctor

## 2010-12-25 NOTE — Progress Notes (Signed)
  Subjective:    Patient ID: [name], male    DOB: 05-29-1951, 60 y.o.   MRN: [id number]  HPI pt here for F/U  [phone] (Mrs. Canion )    Wants to make sure he has everything in place before I leave   HTN- tolerating BP meds, no side effects, fatigue from previous visit has improved, wants to start jogging again, no CP, no HA, no leg swelling  BPH- feels urine stream is adequate, no dribbling, no dysuria  Hep C- still does not have appt for clinic, reviewed labs from march  Prevention- Needs tetanus today, UTD otherwise  Asked to to write speak to his mentor about him showing up and participating in his medical care- wrote a letter instead       Tobacco use- discussed quitting    Review of Systems  Per above     Objective:   Physical Exam  GEN- NAD, pleasant, alert and oriented  CVS- RRR, no murmur  RESP- CTAB  ABD- Soft, NT, no hepatomegaly  Ext- no edema       Assessment & Plan:

## 2010-12-26 ENCOUNTER — Encounter: Payer: Self-pay | Admitting: Family Medicine

## 2010-12-26 NOTE — Assessment & Plan Note (Addendum)
Will send referral again, pt unable to get transportation to [facility]

## 2010-12-26 NOTE — Assessment & Plan Note (Signed)
Pt not ready to quit, states he will try to cut back

## 2010-12-26 NOTE — Assessment & Plan Note (Signed)
Well-controlled on current meds 

## 2010-12-26 NOTE — Assessment & Plan Note (Signed)
Currently assymptomatic

## 2011-01-02 ENCOUNTER — Encounter: Payer: Self-pay | Admitting: Family Medicine

## 2011-01-02 NOTE — Progress Notes (Signed)
  Subjective:    Patient ID: [name], male    DOB: 01-Oct-1950, 60 y.o.   MRN: [id number]  HPI    Review of Systems     Objective:   Physical Exam        Assessment & Plan:     I received a message that [name] has been established with the Hep C clinic and will have an appt scheduled when available and they will send a notice when this occurs

## 2011-01-02 NOTE — H&P (Signed)
[name], [name]                 ACCOUNT NO.:  [id number]   MEDICAL RECORD NO.:  [id number]          PATIENT TYPE:  INP   LOCATION:  1828                         FACILITY:  [facility]   PHYSICIAN:  Melissa L. Ladona Ridgel, MD  DATE OF BIRTH:  1950/09/25   DATE OF ADMISSION:  10/07/2005  DATE OF DISCHARGE:                                HISTORY & PHYSICAL   CHIEF COMPLAINT:  I have a touch of the flu.   PRIMARY CARE PHYSICIAN:  Gentry Fitz; the patient is homeless.   HISTORY OF PRESENT ILLNESS:  The patient is a 60 year old African American  male with a past medical history for mental health issues.  The patient  states that yesterday he started to have fever and lightheadedness with  upper respiratory-type symptoms, runny nose and cough.  The cough is not  productive of any sputum and so he came to the emergency room for further  evaluation.  Prior to this, the patient had no major medical illnesses.   REVIEW OF SYSTEMS:  Review of systems reveals cough with no sputum at this  time, in spite of runny nose, positive chest pain with cough on occasion.  He has had night sweats only related to the recent symptoms, not prior to  this.  No weight loss or weight gain.  His bowels he states have been  marginally well.  He is not constipated, but he does not have loose stools  either.  He states he really keeps to himself and does not have any sick  contacts, but he is living on the street at this time.  All other review of  systems are negative including dysuria.  The patient states he has had no  sexual contact in many years and he has no skin lesions.   PAST MEDICAL HISTORY:  1.  Mental health issues which he will not specify.  2.  He also had a left knee injury.   PAST SURGICAL HISTORY:  Tonsillectomy.   SOCIAL HISTORY:  He says he has occasional tobacco, a little alcohol every 4-  6 weeks.  He is currently homeless.  Mom is deceased secondary to CAD and  diabetes.  Dad is deceased of  unknown etiology.   ALLERGIES:  PENICILLIN, which gives him a rash.   MEDICATIONS:  He currently takes psychotropic medications.  He states that  the name of them is Ladean Raya'; he states it is a shot that he gets every 2  weeks.  He is due for his next shot.  He usually goes to the [facility] at psychotherapy services.  He has not had his shot for this week.  He  was taking Tylenol for fever.   PHYSICAL EXAMINATION:  VITAL SIGNS:  T-max is 103.1, temperature last was  102; blood pressure 135/84; pulse is 86; respirations 24, O2 SAT 99% on room  air.  GENERAL:  This is a well-developed, well-nourished African American male in  no acute distress.  He has a baseline resting tremor.  HEENT:  He is normocephalic, atraumatic.  Pupils are equal, round and  reactive to light.  Extraocular muscles are intact.  Mucous membranes are  moist.  Teeth appear to be intact with no painful areas.  NECK:  Supple.  There is no JVD, no lymph nodes, and no carotid bruits.  He  has got no lymph nodes that are enlarged and no thyromegaly.  He has no  nuchal rigidity.  CHEST:  His chest is clear to auscultation.  There are no rhonchi, rales or  wheezes.  CARDIOVASCULAR:  Regular rate and rhythm, positive S1 and S2.  No S3 or S4.  No murmurs, rubs, or gallops.  ABDOMEN:  Abdomen is soft, nontender and non-distended with positive bowel  sounds.  EXTREMITIES:  No clubbing, cyanosis, or edema.  Feet are filthy; however,  there are no apparent lesions.  NEUROLOGICAL:  As stated, he has a baseline resting tremor which may be  secondary to previous psychotropic medications.  He has no cogwheeling and  no rigidity.  His power is 5/5.  DTRs are 2+.  Cranial nerves II-XII appears  to be intact.  Plantars are downgoing.  Sensation appears grossly intact.   LABORATORY DATA:  He was found to have a white count of 4.5, hemoglobin  13.6, hematocrit 39.5, platelets of 105,000.  His sodium is 136, potassium   is 4.0, chloride 109, CO2 is 24.8 with a BUN of 12, creatinine of 1.6.  His  glucose is 96.  His Strep A testing was negative.  His influenza A and B are  negative.  His urinalysis appears negative.   RADIOLOGIC FINDINGS:  His chest x-ray shows COPD with no acute infiltrate.   ASSESSMENT AND PLAN:  This is a 60 year old African American male being  admitted for fever of unclear origin.  His currently complaints suggest an  upper respiratory infection or possible bronchitis.  The patient had  symptoms of runny nose, sweating and occasional cough x1 day with notable  fevers.   1.  Cardiovascular:  He is hemodynamically stable, no intervention at this      time.  2.  Pulmonary:  He has chronic obstructive pulmonary disease without obvious      infiltrate on chest x-ray.  We will cover him at this time for his upper      respiratory infection symptoms with ceftriaxone and azithromycin.  I did      start him on vancomycin initially, but I will discontinue this at this      time as he does not appear to have any nuchal rigidity suggestive of      meningitis.  3.  Gastrointestinal:  He has no complaints.  He is eating well.  We will      check his stools, since they have been abnormal, for C. difficile, ova      and parasite, and enteric pathogen.  4.  Genitourinary:  His urinalysis is negative, but we will follow with      cultures to assure that there is no occult infection.  5.  Psychiatric:  We will need to check with the [facility] in      the morning to find out about his Depakote and psychotropic medication.  6.  Deep venous thrombosis prophylaxis at this time will be ambulation.      Melissa L. Ladona Ridgel, MD  Electronically Signed     MLT/MEDQ  D:  10/07/2005  T:  10/07/2005  Job:  [id number]

## 2011-01-02 NOTE — Discharge Summary (Signed)
[name], [name] NO.:  [id number]   MEDICAL RECORD NO.:  [id number]          PATIENT TYPE:  INP   LOCATION:  3002                         FACILITY:  [facility]   PHYSICIAN:  Jackie Plum, M.D.DATE OF BIRTH:  06/19/1951   DATE OF ADMISSION:  10/06/2005  DATE OF DISCHARGE:                                 DISCHARGE SUMMARY   DISCHARGE DIAGNOSES:  1.  Upper respiratory tract infection.  2.  Fever, resolved.   SECONDARY DIAGNOSES:  1.  History of occasional cigarette smoking.  2.  Status post tonsillectomy.  3.  Questionable mental health issues.  4.  Allergy to PENICILLIN.   The patient is to continue his psychotropic medications as previously.  He  is being planned for discharge and to follow up with mental health as  previously.  The social worker and case manager have been asked to assist in  this regard.   DISCHARGE LABORATORY DATA:  WBC count 4.4, hemoglobin 14.0, hematocrit 41.1,  platelet count 100 (the patient had thrombocytopenia, which has stayed  fairly stable throughout his hospitalization, ranging from 99-105).  Sodium  139, potassium 3.8, chloride 106, CO2 20, glucose 130, BUN 13, creatinine  1.2, calcium 8.4.   REASON FOR ADMISSION:  Fever of unclear etiology.   The patient was admitted to the hospitalist service.  He head been seen by  the ED physician and actually, according to the ED physician's records,  apparently the patient was planned for discharge; however, he had  temperature of 103.1 degrees Fahrenheit and was admitted to the hospitalist  service for further evaluation.  He had come with complaints of coryza with  cough and congestion, rhinorrhea, fever and chills.  The patient has been  feeling weak and has had some myalgia.  He was admitted by Dr. Ladona Ridgel for  further evaluation.   On admission, according to Dr. Lubertha Basque H&P, the patient's mucous membranes  were said to be moist.  He did not have any JVD, and there was no  lymphadenopathy.  His lungs were clear to auscultation without any rhonchi  or wheezes.  He had regular rate and rhythm.  There was no gallop or murmur.  Abdomen was soft, nontender.  Extremities:  No cyanosis.   His lab work did not reveal any significant leukocytosis.  His white count  was 4.5.  His chemistries were unremarkable.  His drug screen was negative.  His influenza A and B were negative.  His urine was negative for any  infection.  An x-ray did not show any acute infiltrate.  He was admitted for  further evaluation.   The patient was admitted by Dr. Ladona Ridgel for URI versus bronchitis.  On  admission the patient was started on antibiotics of azithromycin and  ceftriaxone.  While on the inpatient service, the patient had a temperature  of 101.1 degrees Fahrenheit maximum documented on one occasion only, and his  O2 saturation on room air has been between 99% up to 100% on room air.  He  has been feeling well.  All his symptoms have resolved.  He did  not have any  constitutional symptoms such nausea, vomiting, abdominal pain, fever or  chills, myalgia.  His runny nose had resolved.  He was feeling good and is  ready for discharge today.  I believe that this represents a viral upper  respiratory tract infection.  The patient does not seem to have any evidence  of acute bronchitis and is being discharged without antibiotics.   The patient will be discharged home to complete his Zithromax regimen.  In  view of COPD findings on x-ray, we will also write for him to get Combivent  MDI today.  The patient discharged in stable and satisfactory condition.   His discharge vital signs:  He had BP of 145/86, pulse 62, temperature 98.2  degrees Fahrenheit, respirations 20, and O2 saturation of 100% on room air.      Jackie Plum, M.D.  Electronically Signed     GO/MEDQ  D:  10/08/2005  T:  10/08/2005  Job:  [id number]   cc:   [employer]

## 2011-01-29 ENCOUNTER — Other Ambulatory Visit: Payer: Self-pay | Admitting: Family Medicine

## 2011-01-29 NOTE — Telephone Encounter (Signed)
Refill request

## 2011-02-06 ENCOUNTER — Ambulatory Visit (INDEPENDENT_AMBULATORY_CARE_PROVIDER_SITE_OTHER): Payer: Medicaid Other | Admitting: Family Medicine

## 2011-02-06 ENCOUNTER — Encounter: Payer: Self-pay | Admitting: Family Medicine

## 2011-02-06 VITALS — BP 127/77 | HR 58 | Temp 98.1°F | Ht 68.0 in | Wt 178.0 lb

## 2011-02-06 DIAGNOSIS — N400 Enlarged prostate without lower urinary tract symptoms: Secondary | ICD-10-CM

## 2011-02-06 NOTE — Patient Instructions (Signed)
You can call me back with the specific question that your friend had Otherwise, keep taking the same medication

## 2011-02-08 NOTE — Progress Notes (Signed)
  Subjective:    Patient ID: [name], male    DOB: 1951/04/01, 60 y.o.   MRN: [id number]  HPI  Pt presents today because he has a question about his meds. He was told by a friend that he could go down on his prostate medication, he denies any difficulty with his urine. I have recently seen [name] in April and March, he had no concerns with his meds. He states everything is working well, and does not need anything changed.   Review of Systems     Objective:   Physical Exam     GEN- NAD,alert and oriented    Assessment & Plan:   BPH- pt is tolerating flomax well. His blood pressure is maintaining. As he has no specific concerns, I will let him call be back if he can remember the specific question. No change to meds

## 2011-03-26 ENCOUNTER — Ambulatory Visit (INDEPENDENT_AMBULATORY_CARE_PROVIDER_SITE_OTHER): Payer: Medicaid Other | Admitting: Gastroenterology

## 2011-03-26 ENCOUNTER — Other Ambulatory Visit: Payer: Self-pay | Admitting: Gastroenterology

## 2011-03-26 VITALS — BP 100/81 | HR 56 | Temp 97.4°F | Ht 68.0 in | Wt 172.0 lb

## 2011-03-26 DIAGNOSIS — B182 Chronic viral hepatitis C: Secondary | ICD-10-CM

## 2011-03-26 LAB — COMPREHENSIVE METABOLIC PANEL
ALT: 27 U/L (ref 0–53)
AST: 42 U/L — ABNORMAL HIGH (ref 0–37)
Albumin: 4.6 g/dL (ref 3.5–5.2)
Alkaline Phosphatase: 56 U/L (ref 39–117)
BUN: 16 mg/dL (ref 6–23)
CO2: 32 mEq/L (ref 19–32)
Calcium: 9.3 mg/dL (ref 8.4–10.5)
Chloride: 103 mEq/L (ref 96–112)
Creat: 1.43 mg/dL — ABNORMAL HIGH (ref 0.50–1.35)
Glucose, Bld: 59 mg/dL — ABNORMAL LOW (ref 70–99)
Potassium: 4.1 mEq/L (ref 3.5–5.3)
Sodium: 142 mEq/L (ref 135–145)
Total Bilirubin: 0.7 mg/dL (ref 0.3–1.2)
Total Protein: 7.2 g/dL (ref 6.0–8.3)

## 2011-03-26 LAB — IRON AND TIBC
%SAT: 38 % (ref 20–55)
Iron: 126 ug/dL (ref 42–165)
TIBC: 336 ug/dL (ref 215–435)
UIBC: 210 ug/dL

## 2011-03-27 LAB — HEPATITIS B SURFACE ANTIBODY,QUALITATIVE: Hep B S Ab: POSITIVE — AB

## 2011-03-27 LAB — CBC WITH DIFFERENTIAL/PLATELET
Basophils Absolute: 0 10*3/uL (ref 0.0–0.1)
Basophils Relative: 0 % (ref 0–1)
Eosinophils Absolute: 0.1 10*3/uL (ref 0.0–0.7)
Eosinophils Relative: 1 % (ref 0–5)
HCT: 43.4 % (ref 39.0–52.0)
Hemoglobin: 14.6 g/dL (ref 13.0–17.0)
Lymphocytes Relative: 43 % (ref 12–46)
Lymphs Abs: 3.6 10*3/uL (ref 0.7–4.0)
MCH: 28.8 pg (ref 26.0–34.0)
MCHC: 33.6 g/dL (ref 30.0–36.0)
MCV: 85.6 fL (ref 78.0–100.0)
Monocytes Absolute: 0.7 10*3/uL (ref 0.1–1.0)
Monocytes Relative: 8 % (ref 3–12)
Neutro Abs: 3.9 10*3/uL (ref 1.7–7.7)
Neutrophils Relative %: 48 % (ref 43–77)
Platelets: 157 10*3/uL (ref 150–400)
RBC: 5.07 MIL/uL (ref 4.22–5.81)
RDW: 15.9 % — ABNORMAL HIGH (ref 11.5–15.5)
WBC: 8.3 10*3/uL (ref 4.0–10.5)

## 2011-03-27 LAB — PROTIME-INR
INR: 1 (ref <1.50)
Prothrombin Time: 13.6 seconds (ref 11.6–15.2)

## 2011-03-27 LAB — TSH: TSH: 0.887 u[IU]/mL (ref 0.350–4.500)

## 2011-03-27 LAB — HEPATITIS B CORE ANTIBODY, TOTAL: Hep B Core Total Ab: POSITIVE — AB

## 2011-03-27 LAB — FERRITIN: Ferritin: 457 ng/mL — ABNORMAL HIGH (ref 22–322)

## 2011-03-27 LAB — HEPATITIS A ANTIBODY, TOTAL: Hep A Total Ab: POSITIVE — AB

## 2011-03-27 LAB — AFP TUMOR MARKER: AFP-Tumor Marker: 10.7 ng/mL — ABNORMAL HIGH (ref 0.0–8.0)

## 2011-04-01 LAB — HEPATITIS C GENOTYPE

## 2011-04-02 NOTE — Progress Notes (Signed)
NAME:  [name], [name]  MR#:  [id number]      DATE:  03/26/2011  DOB:  11-Feb-1951    cc: Consulting Physician:  [facility], [address], [place], [place] [address], Phone [phone] Primary Care Physician:  Same Referring Physician:  Milinda Antis, MD, [facility], [address] [place], [place] [address], Fax [phone]    REASON FOR REFERRAL:  Genotype unknown hepatitis C.   History:  The patient is a 60 year old gentleman who I have been asked to see in consultation by Dr. Jeanice Lim regarding his genotype unknown hepatitis C. The patient came alone today so he could not provide some details of  his history.  He cannot recall exactly when he was diagnosed hepatitis C, but thinks it may have been several years ago. It has not been addressed in the past with a biopsy or treatment. He is unaware of his genotype. There  are no symptoms to suggest cryoglobulin mediated or decompensated liver disease.  With respect to risk factors for liver disease, he denies any significant alcohol use in the last 20 years but reports he drinks 2-3 drinks per year and had a DWI in the 1980s. There is a history of  smoking cocaine and marijuana over 20 years ago but no more recently than this. He denies any history of tattoos but had blood transfusion sometime in the 1980s. There is a history of sterile body piercing but  no family history of liver disease. He may have been vaccinated against hepatitis A, but he does not recall for certain recalling only that he received a vaccine that required 2 injections.   PAST MEDICAL HISTORY:  Significant for by benign prostatic hypertrophy, hypertension but no diabetes dyslipidemia, coronary disease, or dysthyroidism. Retinal vein occlusion.    Past surgical history:  Tonsillectomy and adenectomy in 1959.   Past psychiatric history:  Schizophrenia for which he is managed at the [facility]. He cannot recall the name of  the psychiatrist he sees every 3 months, but reports that he attends there for an injection of Prolixin every 2  weeks. He reports hospitalization 5 years ago but denies any history of active auditory hallucinations at this time.    Current medications:  Norvasc 10 mg p.o. daily, Cogentin injection, fluphenazine or Prolixin injection once every 2 weeks, lisinopril 10 mg p.o. daily, Flomax 0.4 mg p.o. daily.   ALLERGIES:  He denied but the notes indicate penicillin with no reaction stated.    Habits:  Smoking, half pack of cigarettes per day. Alcohol as above.   FAMILY HISTORY:  As above.    SOCIAL HISTORY:  Married but his wife did not accompany him today, 1 son, 1 daughter. 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   REVIEW OF SYSTEMS:  All 10 systems reviewed today with the patient and they are negative other than which was mentioned above. CES-D was 12.   PHYSICAL EXAMINATION:   Constitutional:  Appears stated age without significant bitemporal wasting. Vital signs: Height 68 inches, weight 172 pounds, blood pressure 100/81, pulse 56, temperature 97.4 Fahrenheit. Ears, nose,  mouth, and throat: Unremarkable oropharynx. No thyromegaly or neck masses. Chest: Resonant to percussion. Clear to auscultation.  Cardiovascular:  Heart sounds normal S1, S2 without murmurs, rubs. There was no peripheral edema.  Abdominal exam:  Normal bowel sounds. No masses or tenderness. I could not appreciate liver edge or spleen tip. Lymphatics: No cervical or  inguinal lymphadenopathy. Central nervous system: No asterixis or focalizing findings.  Dermatologic:  Anicteric. No palmar erythema.  Eyes:  Anicteric sclerae. Pupils are equal and reactive to light.   Laboratories:  I reviewed his most recent labs including on 11/04/2010, his AST was 47, ALT 33, ALP 48, total bilirubin 0.8, albumin 4.2, globulins 2.6, creatinine 1.37. CBC; platelets 138, white count 6.3, hemoglobin  13.7,  MCV 83.9.  01/17/2009; hepatitis B surface antigen negative, hepatitis C antibody positive. HIV was negative, RPR was also negative. At that time, his AST was 26 and ALT was 16.   ASSESSMENT:  The patient is a 60 year old gentleman with history of genotype unknown hepatitis C with preserved synthetic function. His schizophrenia is a contraindication to being treated for his hepatitis  C with interferon based therapies, which will be the standard of care for the next several years to come. If his psychiatrist, which incidentally he cannot name, though he sees him every 3 months, feels  differently about this, I would be happy to discuss this with his therapist. In addition, he came without any family today for me to discuss this with them and explain this to them.  In my discussion today with the patient, we discussed the fact that his  schizophrenia would be a contraindication to treating him. I have explained, however that in the future there may be noninterferon based  therapies, which will get around the psychiatric side effects of treatment. We discussed obtaining some basic lab work today. I have asked him if he would bring his wife back to his next appointment so I  can discuss this with her. Again, I have told him if his psychiatrist is willing to communicate with me about his fitness to be treated, I would be happy to receive any information regarding this.   plan:  1. Standard labs. 2. Genotype. 3. Will test for hepatitis A and B immunity because he should vaccinated if he is negative. 4. He is to return in approximately 8 week's time with instructions to bring his wife so I can discuss with her. 5. I would welcome any communication from the mental health facility.            Brooke Dare, MD   Addendum: Genotype 1a  Hepatitis A and B immune.  [id number] .[id number]  D:  Thu Aug 09 17:43:53 2012 ; T:  Thu Aug 09 20:38:55 2012  Job #:  [id number]

## 2011-05-14 ENCOUNTER — Ambulatory Visit: Payer: Medicaid Other | Admitting: Gastroenterology

## 2011-05-25 ENCOUNTER — Encounter: Payer: Self-pay | Admitting: Family Medicine

## 2011-05-25 ENCOUNTER — Ambulatory Visit (INDEPENDENT_AMBULATORY_CARE_PROVIDER_SITE_OTHER): Payer: Medicaid Other | Admitting: Family Medicine

## 2011-05-25 ENCOUNTER — Other Ambulatory Visit: Payer: Self-pay

## 2011-05-25 ENCOUNTER — Ambulatory Visit (HOSPITAL_COMMUNITY)
Admission: RE | Admit: 2011-05-25 | Discharge: 2011-05-25 | Disposition: A | Discharge status: Home / Self Care | Payer: Medicaid Other | Source: Ambulatory Visit | Attending: Family Medicine | Admitting: Family Medicine

## 2011-05-25 VITALS — BP 140/90 | HR 65 | Wt 165.2 lb

## 2011-05-25 DIAGNOSIS — R42 Dizziness and giddiness: Secondary | ICD-10-CM

## 2011-05-25 DIAGNOSIS — I10 Essential (primary) hypertension: Secondary | ICD-10-CM

## 2011-05-25 DIAGNOSIS — I498 Other specified cardiac arrhythmias: Secondary | ICD-10-CM | POA: Insufficient documentation

## 2011-05-25 LAB — BASIC METABOLIC PANEL
BUN: 12 mg/dL (ref 6–23)
CO2: 30 mEq/L (ref 19–32)
Calcium: 9.3 mg/dL (ref 8.4–10.5)
Chloride: 105 mEq/L (ref 96–112)
Creat: 1.29 mg/dL (ref 0.50–1.35)
Glucose, Bld: 80 mg/dL (ref 70–99)
Potassium: 4 mEq/L (ref 3.5–5.3)
Sodium: 142 mEq/L (ref 135–145)

## 2011-05-25 NOTE — Patient Instructions (Signed)
It was good to see you today  I think your low blood pressure may be coming from your mood medications,  I think stopping the norvasc will help with your dizziness If your dizziness persists after stopping the norvasc, come back to see me  We will get some blood work today Call with any questions,  God Bless, Doree Albee MD

## 2011-05-26 LAB — CBC
HCT: 40.3 % (ref 39.0–52.0)
Hemoglobin: 14 g/dL (ref 13.0–17.0)
MCH: 29 pg (ref 26.0–34.0)
MCHC: 34.7 g/dL (ref 30.0–36.0)
MCV: 83.4 fL (ref 78.0–100.0)
Platelets: 148 10*3/uL — ABNORMAL LOW (ref 150–400)
RBC: 4.83 MIL/uL (ref 4.22–5.81)
RDW: 13.7 % (ref 11.5–15.5)
WBC: 6.5 10*3/uL (ref 4.0–10.5)

## 2011-05-26 NOTE — Assessment & Plan Note (Addendum)
DIzziness/weakness likely  BP overmedication. There is also a strong contribution from anti-psychotic medication.  Orthostatics negative. EKG with sinus bradycardia. Will 1/2 norvasc dose. Will also check Cr and K. Discussed red flags for return. Will follow up in 1 week.

## 2011-05-26 NOTE — Progress Notes (Signed)
  Subjective:    Patient ID: [name], male    DOB: Sep 28, 1950, 60 y.o.   MRN: [id number]  HPI Pt here for evaluation of BP. Baseline PMHx/o schizophrenia, BPH, and HTN. Pt reports dizziness and weakness x 1 week. Pt states that he has been exercising more vigorously over the last month to lose weight. Pt states that he has noticed dizziness after taking BP meds (pt unsure of which). BP in 100s intermittently. No LOC, bowel, bladder incontinence No feeling of room spinning with mediation. No CP, SOB, orthopnea.     Review of Systems See HPI    Objective:   Physical Exam Gen: up in chair, NAD HEENT: NCAT, EOMI, TMs clear bilaterally CV: RRR, no murmurs auscultated PULM: CTAB, no wheezes, rales, rhoncii ABD: S/NT/+ bowel sounds  EXT: 2+ peripheral pulses   Assessment & Plan:

## 2011-06-23 ENCOUNTER — Ambulatory Visit (INDEPENDENT_AMBULATORY_CARE_PROVIDER_SITE_OTHER): Payer: Medicaid Other | Admitting: Family Medicine

## 2011-06-23 ENCOUNTER — Encounter: Payer: Self-pay | Admitting: Family Medicine

## 2011-06-23 VITALS — BP 123/83 | HR 75 | Ht 68.0 in | Wt 162.8 lb

## 2011-06-23 DIAGNOSIS — Z23 Encounter for immunization: Secondary | ICD-10-CM

## 2011-06-23 DIAGNOSIS — I10 Essential (primary) hypertension: Secondary | ICD-10-CM

## 2011-06-23 NOTE — Patient Instructions (Signed)
It was good to see today I'm glad that your dizziness has gotten better We will go ahead and discontinue the Norvasc today Followup see me in 3 months Call with any questions God Bless, Doree Albee MD

## 2011-06-23 NOTE — Assessment & Plan Note (Signed)
Well controlled. Dizziness improved. Will d/c norvasc as to decrease risk of hypotension induced dizziness. Will follow up in 3 months.

## 2011-06-23 NOTE — Progress Notes (Signed)
  Subjective:    Patient ID: [name], male    DOB: 03-Oct-1950, 60 y.o.   MRN: [id number]  HPI Patient is here for reevaluation of dizziness. Patient was recent seen by me for dizziness in the setting of multiple blood pressure medications. Orthostatics were negative at the time. What process is likely secondary to overmedication. Patient Norvasc was cut in half to 5 mg a day and last clinical visit. Today patient states that dizziness is although resolved. No episodes of dizziness weakness or gait instability. Pressures have been in the 110s over 70s at home. Patient is also known to be on Flomax for BPH as well as lisinopril 10.   Review of Systems See HPI otherwise 12 point ROS negative.     Objective:   Physical Exam Gen: up in chair, NAD CV: RRR, no murmurs auscultated PULM: CTAB, no wheezes, rales, rhoncii ABD: S/NT/+ bowel sounds  EXT: 2+ peripheral pulses         Assessment & Plan:

## 2011-07-03 ENCOUNTER — Telehealth: Payer: Self-pay | Admitting: Family Medicine

## 2011-07-03 NOTE — Telephone Encounter (Signed)
Unable to leave message. Voice mail not set up yet. Which medication is pt confused about? [name], [name]

## 2011-07-03 NOTE — Telephone Encounter (Signed)
[name] seems to be confused about how to take and what to take as far as his medication is concerned.

## 2011-08-03 ENCOUNTER — Other Ambulatory Visit: Payer: Self-pay | Admitting: Family Medicine

## 2011-08-03 ENCOUNTER — Telehealth: Payer: Self-pay | Admitting: Family Medicine

## 2011-08-03 MED ORDER — TAMSULOSIN HCL 0.4 MG PO CAPS: 0.4000 mg | ORAL_CAPSULE | Freq: Every day | ORAL | Status: DC

## 2011-08-03 NOTE — Telephone Encounter (Signed)
[name] is calling because he says his pharmacy has requested refills on his Prostate medicine.  I did not see it in Dr. Mauro Kaufmann box or the outgoing box.  His Pharmacy is [facility] on [address].  He says he hasn't had it is 3 - 4 days.

## 2011-08-03 NOTE — Telephone Encounter (Signed)
Flomax refilled. Please notify pt.

## 2011-08-03 NOTE — Telephone Encounter (Signed)
Fwd. To Dr.Newton for refill .Kayah Hecker  

## 2011-09-04 ENCOUNTER — Ambulatory Visit (INDEPENDENT_AMBULATORY_CARE_PROVIDER_SITE_OTHER): Payer: Medicaid Other | Admitting: Family Medicine

## 2011-09-04 VITALS — BP 120/72 | Temp 97.6°F | Ht 68.0 in | Wt 162.0 lb

## 2011-09-04 DIAGNOSIS — I10 Essential (primary) hypertension: Secondary | ICD-10-CM

## 2011-09-04 DIAGNOSIS — R300 Dysuria: Secondary | ICD-10-CM

## 2011-09-04 DIAGNOSIS — N419 Inflammatory disease of prostate, unspecified: Secondary | ICD-10-CM | POA: Insufficient documentation

## 2011-09-04 LAB — POCT URINALYSIS DIPSTICK
Bilirubin, UA: NEGATIVE
Blood, UA: NEGATIVE
Glucose, UA: NEGATIVE
Ketones, UA: NEGATIVE
Leukocytes, UA: NEGATIVE
Nitrite, UA: NEGATIVE
Protein, UA: NEGATIVE
Spec Grav, UA: 1.015
Urobilinogen, UA: 2
pH, UA: 6

## 2011-09-04 LAB — BASIC METABOLIC PANEL
BUN: 10 mg/dL (ref 6–23)
CO2: 32 mEq/L (ref 19–32)
Calcium: 8.8 mg/dL (ref 8.4–10.5)
Chloride: 102 mEq/L (ref 96–112)
Creat: 1.32 mg/dL (ref 0.50–1.35)
Glucose, Bld: 102 mg/dL — ABNORMAL HIGH (ref 70–99)
Potassium: 4.2 mEq/L (ref 3.5–5.3)
Sodium: 139 mEq/L (ref 135–145)

## 2011-09-04 LAB — CBC WITH DIFFERENTIAL/PLATELET
Basophils Absolute: 0 10*3/uL (ref 0.0–0.1)
Basophils Relative: 0 % (ref 0–1)
Eosinophils Absolute: 0.1 10*3/uL (ref 0.0–0.7)
Eosinophils Relative: 1 % (ref 0–5)
HCT: 40 % (ref 39.0–52.0)
Hemoglobin: 13.3 g/dL (ref 13.0–17.0)
Lymphocytes Relative: 35 % (ref 12–46)
Lymphs Abs: 2.3 10*3/uL (ref 0.7–4.0)
MCH: 27.9 pg (ref 26.0–34.0)
MCHC: 33.3 g/dL (ref 30.0–36.0)
MCV: 84 fL (ref 78.0–100.0)
Monocytes Absolute: 0.5 10*3/uL (ref 0.1–1.0)
Monocytes Relative: 7 % (ref 3–12)
Neutro Abs: 3.6 10*3/uL (ref 1.7–7.7)
Neutrophils Relative %: 56 % (ref 43–77)
Platelets: 141 10*3/uL — ABNORMAL LOW (ref 150–400)
RBC: 4.76 MIL/uL (ref 4.22–5.81)
RDW: 14.4 % (ref 11.5–15.5)
WBC: 6.5 10*3/uL (ref 4.0–10.5)

## 2011-09-04 MED ORDER — CIPROFLOXACIN HCL 750 MG PO TABS: 750.0000 mg | ORAL_TABLET | Freq: Two times a day (BID) | ORAL | Status: DC

## 2011-09-04 NOTE — Assessment & Plan Note (Signed)
Will treat with cipro x 2 weeks with planned follow up around end of treatment. Urine culture today. Discussed infectious red flags. Handout given.

## 2011-09-04 NOTE — Patient Instructions (Signed)
Prostatitis Prostatitis is an inflammation (the body's way of reacting to injury and/or infection) of the prostate gland. The prostate gland is a male organ. The gland is about the size and shape of a walnut. The prostate is located just below the bladder. It produces semen, which is a fluid that helps nourish and transport sperm. Prostatitis is the most common urinary tract problem in men younger than age 60. There are 4 categories of prostatitis:  I - Acute bacterial prostatitis.   II - Chronic bacterial prostatitis.   III - Chronic prostatitis and chronic pelvic pain syndrome (CPPS).   Inflammatory.   Non inflammatory.   IV - Asymptomatic inflammatory prostatitis.  Acute and chronic bacterial prostatitis are problems with bacterial infections of the prostate. "Acute" infection is usually a one-time problem. "Chronic" bacterial prostatitis is a condition with recurrent infection. It is usually caused by the same germ(bacteria). CPPS has symptoms similar to prostate infection. However, no infection is actually found. This condition can cause problems of ongoing pain. Currently, it cannot be cured. Treatments are available and aimed at symptom control.  Asymptomatic inflammatory prostatitis has no symptoms. It is a condition where infection-fighting cells are found by chance in the urine. The diagnosis is made most often during an exam for other conditions. Other conditions could be infertility or a high level of PSA (prostate-specific antigen) in the blood. SYMPTOMS  Symptoms can vary depending upon the type of prostatitis that exists. There can also be overlap in symptoms. This can make diagnosis difficult. Symptoms: For Acute bacterial prostatitis  Painful urination.   Fever or chills.   Muscle or joint pains.   Low back pain.   Low abdominal pain.   Inability to empty bladder completely.   Sudden urges to urinate.   Frequent urination during the day.   Difficulty starting  urine stream.   Need to urinate several times at night (nocturia).   Weak urine stream.   Urethral (tube that carries urine from the bladder out of the body) discharge and dribbling after urination.  For Chronic bacterial prostatitis  Rectal pain.   Pain in the testicles, penis, or tip of the penis.   Pain in the space between the anus and scrotum (perineum).   Low back pain.   Low abdominal pain.   Problems with sexual function.   Painful ejaculation.   Bloody semen.   Inability to empty bladder completely.   Painful urination.   Sudden urges to urinate.   Frequent urination during the day.   Difficulty starting urine stream.   Need to urinate several times at night (nocturia).   Weak urine stream.   Dribbling after urination.   Urethral discharge.  For Chronic prostatitis and chronic pelvic pain syndrome (CPPS) Symptoms are the same as those for chronic bacterial prostatitis. Problems with sexual function are often the reason for seeking care. This important problem should be discussed with your caregiver. For Asymptomatic inflammatory prostatitis As noted above, there are no symptoms with this condition. DIAGNOSIS   Your caregiver may perform a rectal exam. This exam is to determine if the prostate is swollen and tender.   Sometimes blood work is performed. This is done to see if your white blood cell count is elevated. The Prostate Specific Antigen (PSA) is also measured. PSA is a blood test that can help detect early prostate cancer.   A urinalysis is done to find out what type of infection is present if this is a suspected cause. An   additional urinalysis may be done after a digital rectal exam. This is to see if white blood cells are pushed out of the prostate and into the urine. A low-grade infection of the prostate may not be found on the first urinalysis.  In more difficult cases, your caregiver may advise other tests. Tests could include:  Urodynamics  -- Tests the function of the bladder and the organs involved in triggering and controlling normal urination.   Urine flow rate.   Cystoscopy -- In this procedure, a thin, telescope-like tube with a light and tiny camera attached (cystoscope) is inserted into the bladder through the urethra. This allows the caregiver to see the inside of the urethra and bladder.   Electromyography -- This procedure tests how the muscles and nerves of the bladder work. It is focused on the muscles that control the anus and pelvic floor. These are the muscles between the anus and scrotum.  In people who show no signs of infection, certain uncommon infections might be causing constant or recurrent symptoms. These uncommon infections are difficult to detect. More work in medicine may help find solutions to these problems. TREATMENT  Antibiotics are used to treat infections caused by germs. If the infection is not treated and becomes long lasting (chronic), it may become a lower grade infection with minor, continual problems. Without treatment, the prostate may develop a boil or furuncle (abscess). This may require surgical treatment. For those with chronic prostatitis and CPPS, it is important to work closely with your primary caregiver and urologist. For some, the medicines that are used to treat a non-cancerous, enlarged prostate (benign prostatic hypertrophy) may be helpful. Referrals to specialists other than urologists may be necessary. In rare cases when all treatments have been inadequate for pain control, an operation to remove the prostate may be recommended. This is very rare and before this is considered thorough discussion with your urologist is highly recommended.  In cases of secondary to chronic non-bacterial prostatitis, a good relationship with your urologist or primary caregiver is essential because it is often a recurrent prolonged condition that requires a good understanding of the causes and a commitment  to therapy aimed at controlling your symptoms. HOME CARE INSTRUCTIONS   Hot sitz baths for 20 minutes, 4 times per day, may help relieve pain.   Non-prescription pain killers may be used as your caregiver recommends if you have no allergies to them. Some illnesses or conditions prevent use of non-prescription drugs. If unsure, check with your caregiver. Take all medications as directed. Take the antibiotics for the prescribed length of time, even if you are feeling better.  SEEK MEDICAL CARE IF:   You have any worsening of the symptoms that originally brought you to your caregiver.   You have an oral temperature above 102 F (38.9 C).   You experience any side effects from medications prescribed.  SEEK IMMEDIATE MEDICAL CARE IF:   You have an oral temperature above 102 F (38.9 C), not controlled by medicine.   You have pain not relieved with medications.   You develop nausea, vomiting, lightheadedness, or have a fainting episode.   You are unable to urinate.   You pass bloody urine or clots.  Document Released: 07/31/2000 Document Revised: 04/15/2011 Document Reviewed: 02/19/2009 [employer] Patient Information 2012 [employer]. 

## 2011-09-04 NOTE — Assessment & Plan Note (Signed)
Seemingly diet controlled. Pt is not on ACE. Will check Cr today as recent studies show pt with GFR of around 70. If GFR still in 70s or lower, will consider formal dx of stage 2 CKD.

## 2011-09-04 NOTE — Progress Notes (Signed)
  Subjective:    Patient ID: [name], male    DOB: [birth year], 61 y.o.   MRN: [id number]  HPI Pt presents today with dysuria x 10 days. Pt states that he has noticed painful urination during this time period. Pt has also noticed mildly increased urinary frequency. No hematuria, nausea, vomiting, fever. Pt states that he has sexual activity with his wife 4 weeks ago. Otherwise no sexual activity. Pt states that this has never happened before. Pt with a baseline hx/o BPH that he is taking flomax for. Pt states that he has been compliant with this regimen.   HTN:  Checking BPS Daily ?:no; 2-3 times per week BP Range:110s-120s sytolic Any HA, CP, SOB?:no Any Medication Side Effects:no; not on medications currently Medication Compliance:n/a Salt intake?:low Exercise?:is in process of starting to walk/run again Weight Loss?:at goal weight currently      Review of Systems See HPI, otherwise ROS negative.     Objective:   Physical Exam Gen: up in chair, NAD CV: RRR, no murmurs auscultated PULM: CTAB, no wheezes, rales, rhoncii ABD: S/NT/+ bowel sounds/no suprapubic tenderness, no flank pain  GU: + TTP to prostate on prostate exam. No palpated nodularity.  EXT: 2+ peripheral pulses   Assessment & Plan:

## 2011-09-06 LAB — URINE CULTURE
Colony Count: NO GROWTH
Organism ID, Bacteria: NO GROWTH

## 2011-09-18 ENCOUNTER — Encounter: Payer: Self-pay | Admitting: Family Medicine

## 2011-09-18 ENCOUNTER — Ambulatory Visit (INDEPENDENT_AMBULATORY_CARE_PROVIDER_SITE_OTHER): Payer: Medicaid Other | Admitting: Family Medicine

## 2011-09-18 DIAGNOSIS — N390 Urinary tract infection, site not specified: Secondary | ICD-10-CM

## 2011-09-18 DIAGNOSIS — N419 Inflammatory disease of prostate, unspecified: Secondary | ICD-10-CM

## 2011-09-18 DIAGNOSIS — N401 Enlarged prostate with lower urinary tract symptoms: Secondary | ICD-10-CM

## 2011-09-18 DIAGNOSIS — N138 Other obstructive and reflux uropathy: Secondary | ICD-10-CM

## 2011-09-18 MED ORDER — ASPIRIN EC 81 MG PO TBEC: 81.0000 mg | DELAYED_RELEASE_TABLET | Freq: Every day | ORAL | Status: DC

## 2011-09-18 MED ORDER — TAMSULOSIN HCL 0.4 MG PO CAPS: 0.8000 mg | ORAL_CAPSULE | Freq: Every day | ORAL | Status: DC

## 2011-09-18 NOTE — Assessment & Plan Note (Signed)
Urine cx negative. Symptomatically resolved.

## 2011-09-18 NOTE — Assessment & Plan Note (Signed)
Increasing flomax to 0.8mg  daily for nocturia improvement. Will follow in about 3 months. Next step would be addition of 5-alpha reductase inhibitor to help with prostatic hypertrophy.

## 2011-09-18 NOTE — Patient Instructions (Signed)
It was good to see you today.  I am glad that you are feeling better If you develop these symptoms again, come back for a visit I am increasing your flomax Come back to see me in 3-6 months, Call with any questions,  God Bless, Doree Albee MD

## 2011-09-18 NOTE — Progress Notes (Signed)
  Subjective:    Patient ID: [name], male    DOB: 05-08-1951, 61 y.o.   MRN: [id number]  HPI Pt is here for follow up on UTI/prostatitis. Pt was placed on 2 week course of cipro. Urine cx negative.  Today, pt states that dysuria has resolved. Urinary frequency has markedly improved. No adb pain, nausea, vomiting, fever, or hematuria. Pt states that he still has some nighttime nocturia, however this has been a chronic issue and has returned back to baseline.   Review of Systems See HPI, otherwise ROS negative.     Objective:   Physical Exam Gen: up in chair, NAD CV: RRR, no murmurs auscultated PULM: CTAB, no wheezes, rales, rhoncii ABD: S/NT/+ bowel sounds/no suprapubic tenderness EXT: 2+ peripheral pulses   Assessment & Plan:

## 2011-09-18 NOTE — Assessment & Plan Note (Addendum)
Clinically resolved s/p cipro. If sxs recur may consider 4 week course of cipro/bactrim/doxy.

## 2011-09-29 ENCOUNTER — Ambulatory Visit (INDEPENDENT_AMBULATORY_CARE_PROVIDER_SITE_OTHER): Payer: Medicaid Other | Admitting: Family Medicine

## 2011-09-29 ENCOUNTER — Encounter: Payer: Self-pay | Admitting: Family Medicine

## 2011-09-29 VITALS — BP 106/75 | HR 71 | Ht 68.0 in | Wt 160.5 lb

## 2011-09-29 DIAGNOSIS — R300 Dysuria: Secondary | ICD-10-CM

## 2011-09-29 DIAGNOSIS — N419 Inflammatory disease of prostate, unspecified: Secondary | ICD-10-CM

## 2011-09-29 LAB — POCT URINALYSIS DIPSTICK
Blood, UA: NEGATIVE
Glucose, UA: NEGATIVE
Ketones, UA: NEGATIVE
Leukocytes, UA: NEGATIVE
Nitrite, UA: NEGATIVE
Protein, UA: NEGATIVE
Spec Grav, UA: 1.02
Urobilinogen, UA: 2
pH, UA: 6.5

## 2011-09-29 LAB — PSA, TOTAL AND FREE
PSA, Free Pct: 31 % (ref >25)
PSA, Free: <0.1 ng/mL
PSA: 0.29 ng/mL (ref <=4.00)

## 2011-09-29 MED ORDER — DOXYCYCLINE HYCLATE 100 MG PO TABS: 100.0000 mg | ORAL_TABLET | Freq: Two times a day (BID) | ORAL | Status: AC

## 2011-09-29 NOTE — Progress Notes (Signed)
  Subjective:    Patient ID: [name], male    DOB: 1951/01/23, 61 y.o.   MRN: [id number]  HPI Patient is here to discuss dysuria x3 days. Patient was recently treated for prostatitis approximately 3 weeks ago. Patient was placed on course of Cipro. Urinalysis, urine culture were all within normal limits at the time. Patient symptomatically improved after a two-week course of  Cipro. Followup was actually last week where pt states that sxs resolved. Patient states over the weekend he was hanging out with friends and noticed that he developed some burning with urination. Patient denies any recent sexual activity associated with this hanging out. Patient denies any recent sexual activity every for this last 8 months. Patient describes dysuria as being more for discomfort. Mildly increased urinary frequency frequency. No hematuria. No systemic symptoms including nausea, vomiting, fevers, chills. Patient states that he is still waking up at night in the setting of baseline BPH.  Review of Systems See HPI, otherwise ROS negative.   Objective:   Physical Exam Gen: up in chair, NAD HEENT: NCAT, EOMI, TMs clear bilaterally CV: RRR, no murmurs auscultated PULM: CTAB, no wheezes, rales, rhoncii ABD: S/+bowel sounds/faint lower abd tenderness  GU: + mild prostatitic TTP, no noted nodularity EXT: 2+ peripheral pulses   Assessment & Plan:

## 2011-09-29 NOTE — Patient Instructions (Signed)
Prostatitis Prostatitis is an inflammation (the body's way of reacting to injury and/or infection) of the prostate gland. The prostate gland is a male organ. The gland is about the size and shape of a walnut. The prostate is located just below the bladder. It produces semen, which is a fluid that helps nourish and transport sperm. Prostatitis is the most common urinary tract problem in men younger than age 60. There are 4 categories of prostatitis:  I - Acute bacterial prostatitis.   II - Chronic bacterial prostatitis.   III - Chronic prostatitis and chronic pelvic pain syndrome (CPPS).   Inflammatory.   Non inflammatory.   IV - Asymptomatic inflammatory prostatitis.  Acute and chronic bacterial prostatitis are problems with bacterial infections of the prostate. "Acute" infection is usually a one-time problem. "Chronic" bacterial prostatitis is a condition with recurrent infection. It is usually caused by the same germ(bacteria). CPPS has symptoms similar to prostate infection. However, no infection is actually found. This condition can cause problems of ongoing pain. Currently, it cannot be cured. Treatments are available and aimed at symptom control.  Asymptomatic inflammatory prostatitis has no symptoms. It is a condition where infection-fighting cells are found by chance in the urine. The diagnosis is made most often during an exam for other conditions. Other conditions could be infertility or a high level of PSA (prostate-specific antigen) in the blood. SYMPTOMS  Symptoms can vary depending upon the type of prostatitis that exists. There can also be overlap in symptoms. This can make diagnosis difficult. Symptoms: For Acute bacterial prostatitis  Painful urination.   Fever or chills.   Muscle or joint pains.   Low back pain.   Low abdominal pain.   Inability to empty bladder completely.   Sudden urges to urinate.   Frequent urination during the day.   Difficulty starting  urine stream.   Need to urinate several times at night (nocturia).   Weak urine stream.   Urethral (tube that carries urine from the bladder out of the body) discharge and dribbling after urination.  For Chronic bacterial prostatitis  Rectal pain.   Pain in the testicles, penis, or tip of the penis.   Pain in the space between the anus and scrotum (perineum).   Low back pain.   Low abdominal pain.   Problems with sexual function.   Painful ejaculation.   Bloody semen.   Inability to empty bladder completely.   Painful urination.   Sudden urges to urinate.   Frequent urination during the day.   Difficulty starting urine stream.   Need to urinate several times at night (nocturia).   Weak urine stream.   Dribbling after urination.   Urethral discharge.  For Chronic prostatitis and chronic pelvic pain syndrome (CPPS) Symptoms are the same as those for chronic bacterial prostatitis. Problems with sexual function are often the reason for seeking care. This important problem should be discussed with your caregiver. For Asymptomatic inflammatory prostatitis As noted above, there are no symptoms with this condition. DIAGNOSIS   Your caregiver may perform a rectal exam. This exam is to determine if the prostate is swollen and tender.   Sometimes blood work is performed. This is done to see if your white blood cell count is elevated. The Prostate Specific Antigen (PSA) is also measured. PSA is a blood test that can help detect early prostate cancer.   A urinalysis is done to find out what type of infection is present if this is a suspected cause. An   additional urinalysis may be done after a digital rectal exam. This is to see if white blood cells are pushed out of the prostate and into the urine. A low-grade infection of the prostate may not be found on the first urinalysis.  In more difficult cases, your caregiver may advise other tests. Tests could include:  Urodynamics  -- Tests the function of the bladder and the organs involved in triggering and controlling normal urination.   Urine flow rate.   Cystoscopy -- In this procedure, a thin, telescope-like tube with a light and tiny camera attached (cystoscope) is inserted into the bladder through the urethra. This allows the caregiver to see the inside of the urethra and bladder.   Electromyography -- This procedure tests how the muscles and nerves of the bladder work. It is focused on the muscles that control the anus and pelvic floor. These are the muscles between the anus and scrotum.  In people who show no signs of infection, certain uncommon infections might be causing constant or recurrent symptoms. These uncommon infections are difficult to detect. More work in medicine may help find solutions to these problems. TREATMENT  Antibiotics are used to treat infections caused by germs. If the infection is not treated and becomes long lasting (chronic), it may become a lower grade infection with minor, continual problems. Without treatment, the prostate may develop a boil or furuncle (abscess). This may require surgical treatment. For those with chronic prostatitis and CPPS, it is important to work closely with your primary caregiver and urologist. For some, the medicines that are used to treat a non-cancerous, enlarged prostate (benign prostatic hypertrophy) may be helpful. Referrals to specialists other than urologists may be necessary. In rare cases when all treatments have been inadequate for pain control, an operation to remove the prostate may be recommended. This is very rare and before this is considered thorough discussion with your urologist is highly recommended.  In cases of secondary to chronic non-bacterial prostatitis, a good relationship with your urologist or primary caregiver is essential because it is often a recurrent prolonged condition that requires a good understanding of the causes and a commitment  to therapy aimed at controlling your symptoms. HOME CARE INSTRUCTIONS   Hot sitz baths for 20 minutes, 4 times per day, may help relieve pain.   Non-prescription pain killers may be used as your caregiver recommends if you have no allergies to them. Some illnesses or conditions prevent use of non-prescription drugs. If unsure, check with your caregiver. Take all medications as directed. Take the antibiotics for the prescribed length of time, even if you are feeling better.  SEEK MEDICAL CARE IF:   You have any worsening of the symptoms that originally brought you to your caregiver.   You have an oral temperature above 102 F (38.9 C).   You experience any side effects from medications prescribed.  SEEK IMMEDIATE MEDICAL CARE IF:   You have an oral temperature above 102 F (38.9 C), not controlled by medicine.   You have pain not relieved with medications.   You develop nausea, vomiting, lightheadedness, or have a fainting episode.   You are unable to urinate.   You pass bloody urine or clots.  Document Released: 07/31/2000 Document Revised: 04/15/2011 Document Reviewed: 02/19/2009 [employer] Patient Information 2012 [employer]. 

## 2011-09-29 NOTE — Assessment & Plan Note (Signed)
Recurrence of this clinically today. Will place on a four-week course of doxycycline. We'll also obtain PSA to correlate. Plan followup in 4 weeks to reassess. Patient will likely benefit from being started on a 5 alpha reductase inhibitor as to decrease prostatic hypertrophy. We'll address this at the followup visit. Red flags discussed. Handout given.

## 2011-10-05 ENCOUNTER — Encounter: Payer: Self-pay | Admitting: Family Medicine

## 2011-11-11 ENCOUNTER — Ambulatory Visit (INDEPENDENT_AMBULATORY_CARE_PROVIDER_SITE_OTHER): Payer: Medicaid Other | Admitting: Family Medicine

## 2011-11-11 ENCOUNTER — Encounter: Payer: Self-pay | Admitting: Family Medicine

## 2011-11-11 VITALS — BP 131/71 | HR 65 | Ht 68.0 in | Wt 167.0 lb

## 2011-11-11 DIAGNOSIS — N419 Inflammatory disease of prostate, unspecified: Secondary | ICD-10-CM

## 2011-11-11 DIAGNOSIS — N401 Enlarged prostate with lower urinary tract symptoms: Secondary | ICD-10-CM

## 2011-11-11 DIAGNOSIS — I10 Essential (primary) hypertension: Secondary | ICD-10-CM

## 2011-11-11 DIAGNOSIS — N138 Other obstructive and reflux uropathy: Secondary | ICD-10-CM

## 2011-11-11 LAB — COMPREHENSIVE METABOLIC PANEL
ALT: 30 U/L (ref 0–53)
AST: 38 U/L — ABNORMAL HIGH (ref 0–37)
Albumin: 4.1 g/dL (ref 3.5–5.2)
Alkaline Phosphatase: 57 U/L (ref 39–117)
BUN: 13 mg/dL (ref 6–23)
CO2: 27 mEq/L (ref 19–32)
Calcium: 8.9 mg/dL (ref 8.4–10.5)
Chloride: 103 mEq/L (ref 96–112)
Creat: 1.4 mg/dL — ABNORMAL HIGH (ref 0.50–1.35)
Glucose, Bld: 124 mg/dL — ABNORMAL HIGH (ref 70–99)
Potassium: 3.9 mEq/L (ref 3.5–5.3)
Sodium: 136 mEq/L (ref 135–145)
Total Bilirubin: 0.5 mg/dL (ref 0.3–1.2)
Total Protein: 6.7 g/dL (ref 6.0–8.3)

## 2011-11-11 LAB — LDL CHOLESTEROL, DIRECT: Direct LDL: 99 mg/dL

## 2011-11-11 MED ORDER — LISINOPRIL 10 MG PO TABS: 10.0000 mg | ORAL_TABLET | Freq: Every day | ORAL | Status: DC

## 2011-11-11 MED ORDER — ASPIRIN EC 81 MG PO TBEC: 81.0000 mg | DELAYED_RELEASE_TABLET | Freq: Every day | ORAL | Status: DC

## 2011-11-11 MED ORDER — TAMSULOSIN HCL 0.4 MG PO CAPS: 0.8000 mg | ORAL_CAPSULE | Freq: Every day | ORAL | Status: DC

## 2011-11-11 NOTE — Assessment & Plan Note (Signed)
Currently on Flomax. Will continue with this current regimen. Broached issue of 5 alpha reductase inhibitors in the past however sexual  function has been a concern. Will therefore defer use, unless urinary symptoms worsen.

## 2011-11-11 NOTE — Patient Instructions (Signed)
It  was good to see today About your prostate symptoms are doing better If your symptoms return please let us know I'm getting some baseline blood work Come back for general followup visit in 3-4 months Call if any questions God Bless, Doree Albee MD

## 2011-11-11 NOTE — Progress Notes (Signed)
  Subjective:    Patient ID: [name], male    DOB: 09-Jun-1951, 61 y.o.   MRN: [id number]  HPI Patient is here for prostatitis followup. Patient seen on February 12 for recurrence of prostatitis and was placed on a four-week course of doxycycline. Urine culture was negative. PSA was within the normal limits. Today, patient states that symptoms have resolved. Patient denies any dysuria, abdominal pain or increased urinary frequency. Patient is currently taking Flomax for BPH. Patient states he has missed a couple doses and has had some mild increase urinary frequency with this however this resolves with restarting medication. Patient denies any systemic symptoms including fevers chills nausea or vomiting. No hematuria.    Review of Systems See HPI, otherwise ROS negative     Objective:   Physical Exam Gen: up in chair, NAD HEENT: NCAT, EOMI, TMs clear bilaterally CV: RRR, no murmurs auscultated PULM: CTAB, no wheezes, rales, rhoncii ABD: S/NT/+ bowel sounds, no suprapubic tenderness EXT: 2+ peripheral pulses      Assessment & Plan:

## 2011-11-11 NOTE — Assessment & Plan Note (Signed)
Clinically resolved status post oxide. We'll continue to follow when necessary. Discussed infectious red plaques return which patient is aware of.

## 2011-11-11 NOTE — Assessment & Plan Note (Signed)
Well-controlled continue current regimen. We'll check cholesterol today for vascular risk management.

## 2011-11-14 ENCOUNTER — Encounter (HOSPITAL_COMMUNITY): Payer: Self-pay | Admitting: Emergency Medicine

## 2011-11-14 ENCOUNTER — Emergency Department (HOSPITAL_COMMUNITY)
Admission: EM | Admit: 2011-11-14 | Discharge: 2011-11-14 | Disposition: A | Discharge status: Home / Self Care | Payer: Medicaid Other | Attending: Emergency Medicine | Admitting: Emergency Medicine

## 2011-11-14 DIAGNOSIS — I10 Essential (primary) hypertension: Secondary | ICD-10-CM | POA: Insufficient documentation

## 2011-11-14 DIAGNOSIS — Z79899 Other long term (current) drug therapy: Secondary | ICD-10-CM | POA: Insufficient documentation

## 2011-11-14 DIAGNOSIS — Z760 Encounter for issue of repeat prescription: Secondary | ICD-10-CM

## 2011-11-14 HISTORY — DX: Other specified disorders of prostate: N42.89

## 2011-11-14 MED ORDER — LISINOPRIL 10 MG PO TABS: 10.0000 mg | ORAL_TABLET | Freq: Every day | ORAL | Status: DC

## 2011-11-14 MED ORDER — TAMSULOSIN HCL 0.4 MG PO CAPS
0.8000 mg | ORAL_CAPSULE | Freq: Every day | ORAL | Status: DC
  Administered 2011-11-14: 0.8 mg via ORAL
  Filled 2011-11-14: qty 2

## 2011-11-14 MED ORDER — TAMSULOSIN HCL 0.4 MG PO CAPS: 0.8000 mg | ORAL_CAPSULE | Freq: Every day | ORAL | Status: DC

## 2011-11-14 MED ORDER — LISINOPRIL 10 MG PO TABS
10.0000 mg | ORAL_TABLET | Freq: Once | ORAL | Status: AC
  Administered 2011-11-14: 10 mg via ORAL
  Filled 2011-11-14: qty 1

## 2011-11-14 NOTE — ED Provider Notes (Signed)
History     CSN: [id number]  Arrival date & time 11/14/11  1330   First MD Initiated Contact with Patient 11/14/11 1345      Chief Complaint  Patient presents with  . Medication Refill    (Consider location/radiation/quality/duration/timing/severity/associated sxs/prior treatment) HPI History from patient. He states that he saw his PCP, Dr. Alvester Morin in the family practice clinic, March 27. He was given refills on his Flomax and lisinopril. He states that he had these filled and was taking a taxi home from the pharmacy. He believes that the prescriptions fell out in the cab as he realized that he did not have them anymore when he got home. States he has been out of the Flomax for some time due to his BPH, and has had increased frequency with decreased stream over the past several days as he has not had his medicine. He denies other complaints.  Past Medical History  Diagnosis Date  . Hypertension   . Hepatitis C   . Depression   . BPH (benign prostatic hyperplasia)   . Schizophrenia   . Retinal vein occlusion   . Colon polyp 2010  . Prostate atrophy     History reviewed. No pertinent past surgical history.  History reviewed. No pertinent family history.  History  Substance Use Topics  . Smoking status: Current Everyday Smoker -- 0.3 packs/day    Types: Cigarettes  . Smokeless tobacco: Never Used   Comment: decreased smoking  . Alcohol Use: No      Review of Systems  Constitutional: Negative for fever, chills, activity change and appetite change.  Cardiovascular: Negative for chest pain.  Gastrointestinal: Negative for abdominal pain.  Genitourinary: Positive for decreased urine volume and difficulty urinating. Negative for flank pain.  Musculoskeletal: Negative for back pain.    Allergies  Penicillins  Home Medications   Current Outpatient Rx  Name Route Sig Dispense Refill  . AMLODIPINE BESYLATE 10 MG PO TABS Oral Take 10 mg by mouth daily.    Marland Kitchen BENZTROPINE  MESYLATE 1 MG PO TABS Oral Take 1 mg by mouth 2 (two) times daily.    Marland Kitchen FLUPHENAZINE DECANOATE 25 MG/ML IJ SOLN Intramuscular Inject 25 mg into the muscle every 14 (fourteen) days. Last dose March 20th, 2013    . HYDROXYZINE PAMOATE 50 MG PO CAPS Oral Take 50 mg by mouth 3 (three) times daily as needed. For itching    . LISINOPRIL 10 MG PO TABS Oral Take 10 mg by mouth daily.    Marland Kitchen TAMSULOSIN HCL 0.4 MG PO CAPS Oral Take 0.8 mg by mouth daily.      BP 118/74  Pulse 69  Temp(Src) 98 F (36.7 C) (Oral)  SpO2 98%  Physical Exam  Nursing note and vitals reviewed. Constitutional: He appears well-developed and well-nourished. No distress.  HENT:  Head: Normocephalic and atraumatic.  Neck: Normal range of motion.  Cardiovascular: Normal rate.   Pulmonary/Chest: Effort normal.  Abdominal: Soft.  Neurological: He is alert.  Skin: Skin is warm and dry. No rash noted. He is not diaphoretic.  Psychiatric: He has a normal mood and affect.    ED Course  Procedures (including critical care time)  Labs Reviewed - No data to display No results found.   1. Medication refill       MDM  Contacted case mgmt. Unable to offer med assistance as pt has Medicaid. Pt given one dose of meds here today and prescriptions written for 1 mo supply of both  lisinopril and Flomax. Instructed to f/u with PCP for further meds.        Grant Fontana, [place] 11/14/11 [phone]

## 2011-11-14 NOTE — Discharge Instructions (Signed)
Medication Refill, Emergency Department  We have refilled your medication today as a courtesy to you. It is best for your medical care, however, to take care of getting refills done through your primary caregiver's office. They have your records and can do a better job of follow-up than we can in the emergency department.  On maintenance medications, we often only prescribe enough medications to get you by until you are able to see your regular caregiver. This is a more expensive way to refill medications.  In the future, please plan for refills so that you will not have to use the emergency department for this.  Thank you for your help. Your help allows us to better take care of the daily emergencies that enter our department.  Document Released: 11/20/2003 Document Revised: 07/23/2011 Document Reviewed: 08/03/2005  [employer] Patient Information 2012 [employer].

## 2011-11-14 NOTE — ED Notes (Signed)
Messages sent to pharmacy for meds to be tubed to [facility] asap.

## 2011-11-14 NOTE — ED Notes (Signed)
Pt reports that he also needs lisinopril.

## 2011-11-14 NOTE — ED Notes (Signed)
Pt. Stated, When i rode in the taxi, my RX must have fell out or left it.  So I'm out of my Flomax

## 2011-11-19 ENCOUNTER — Telehealth: Payer: Self-pay | Admitting: Family Medicine

## 2011-11-19 NOTE — Telephone Encounter (Signed)
First, called medicaid and was told this is a preferred medication. Then called pharmacy and was told patient picked up # 60 tab on 03/27.  For $3.00 copay . Not sure what the problem is. Called number provided and he is not there . Was given another number and no answer. Message left with a male that answered phone at first number to have patient call back to discuss.

## 2011-11-19 NOTE — Telephone Encounter (Signed)
Called and spoke with patient. The problem is that he did fill the rx on 03/27 but he lost the medication in the cab. Medicaid will not pay again until 04/27.  Apoligized for the miscommunication   but do not know where he can get it cheaper. Advised to call around to different pharmacies to ask.

## 2011-11-19 NOTE — Telephone Encounter (Signed)
Pt states that he got refills at [facility] on his Tamulosin  - [facility] wants to charge him $176 to refill and wants to know if he can get it somewhere else cheaper.

## 2011-11-20 NOTE — ED Provider Notes (Signed)
Medical screening examination/treatment/procedure(s) were performed by non-physician practitioner and as supervising physician I was immediately available for consultation/collaboration.   Gavin Pound. Oletta Lamas, MD 11/20/11 2013

## 2012-01-18 ENCOUNTER — Encounter: Payer: Self-pay | Admitting: Family Medicine

## 2012-01-18 ENCOUNTER — Ambulatory Visit (INDEPENDENT_AMBULATORY_CARE_PROVIDER_SITE_OTHER): Payer: Medicaid Other | Admitting: Family Medicine

## 2012-01-18 VITALS — BP 105/72 | HR 67 | Ht 68.0 in | Wt 167.0 lb

## 2012-01-18 DIAGNOSIS — R300 Dysuria: Secondary | ICD-10-CM

## 2012-01-18 DIAGNOSIS — N390 Urinary tract infection, site not specified: Secondary | ICD-10-CM

## 2012-01-18 DIAGNOSIS — N419 Inflammatory disease of prostate, unspecified: Secondary | ICD-10-CM

## 2012-01-18 LAB — POCT URINALYSIS DIPSTICK
Bilirubin, UA: NEGATIVE
Blood, UA: NEGATIVE
Glucose, UA: NEGATIVE
Leukocytes, UA: NEGATIVE
Nitrite, UA: NEGATIVE
Protein, UA: NEGATIVE
Spec Grav, UA: 1.02
Urobilinogen, UA: 1
pH, UA: 5.5

## 2012-01-18 MED ORDER — CIPROFLOXACIN HCL 750 MG PO TABS: 750.0000 mg | ORAL_TABLET | Freq: Two times a day (BID) | ORAL | Status: AC

## 2012-01-18 MED ORDER — CIPROFLOXACIN HCL 750 MG PO TABS: 750.0000 mg | ORAL_TABLET | Freq: Two times a day (BID) | ORAL | Status: DC

## 2012-01-18 NOTE — Patient Instructions (Signed)

## 2012-01-18 NOTE — Progress Notes (Signed)
  Subjective:    Patient ID: [name], male    DOB: 24-Dec-1950, 61 y.o.   MRN: [id number]  HPI DYSURIA Onset:  1 week  Description: intermittent dysuria  Modifying factors: prior hx/o prostatitis, treated 09/2011   Symptoms Urgency:  no Frequency: no  Hesitancy:  no Hematuria: no  Flank Pain: no  Fever: no Nausea/Vomiting:  no Missed LMP: no STD exposure: no Discharge: no Irritants: no Rash: no  Red Flags   More than 3 UTI's last 12 months:  no PMH of  Diabetes or Immunosuppression:  no Renal Disease/Calculi: no Urinary Tract Abnormality:  no Instrumentation or Trauma: no     Review of Systems See HPI, otherwise ROS negative     Objective:   Physical Exam Gen: up in chair, NAD HEENT: NCAT, EOMI, TMs clear bilaterally CV: RRR, no murmurs auscultated PULM: CTAB, no wheezes, rales, rhoncii ABD: S/NT/+ bowel sounds  GU: normal external genitalia, no visible lesions, no prostatic tenderness  EXT: 2+ peripheral pulses         Assessment & Plan:

## 2012-01-18 NOTE — Progress Notes (Signed)
Addended by: Herminio Heads on: 01/18/2012 04:09 PM   Modules accepted: Orders

## 2012-01-18 NOTE — Assessment & Plan Note (Signed)
Will clinically treat for complicated UTI. No prostatic tenderness on exam. Cipro x 2 weeks. Urine culture. Infectious red flags discussed. Follow up as needed. Handout given.

## 2012-01-19 LAB — URINE CULTURE: Colony Count: 9000

## 2012-02-12 ENCOUNTER — Encounter: Payer: Self-pay | Admitting: Family Medicine

## 2012-02-12 ENCOUNTER — Ambulatory Visit (INDEPENDENT_AMBULATORY_CARE_PROVIDER_SITE_OTHER): Payer: Medicaid Other | Admitting: Family Medicine

## 2012-02-12 VITALS — BP 109/70 | HR 69 | Ht 68.5 in | Wt 164.0 lb

## 2012-02-12 DIAGNOSIS — I10 Essential (primary) hypertension: Secondary | ICD-10-CM

## 2012-02-15 NOTE — Progress Notes (Signed)
  Subjective:    Patient ID: [name], male    DOB: 1950-08-19, 61 y.o.   MRN: [id number]  HPI Pt presents today with no major complaints Pt desires to say goodbye.  Pt had general exercise questions. Medical problems otherwise stable.    Review of Systems See HPI, otherwise ROS negative     Objective:   Physical Exam Gen: up in chair, NAD HEENT: NCAT, EOMI CV: RRR, no murmurs auscultated PULM: CTAB, no wheezes, rales, rhoncii ABD: S/NT/+ bowel sounds  EXT: 2+ peripheral pulses    Assessment & Plan:  [place] Spaniel goodbye to pt as well  Expressed appreciation in being able to take care of pt.  Pt expressed understanding as well.  Discussed general exercise principals.

## 2012-05-20 ENCOUNTER — Ambulatory Visit: Payer: Medicaid Other | Admitting: Emergency Medicine

## 2012-05-27 ENCOUNTER — Other Ambulatory Visit: Payer: Self-pay | Admitting: *Deleted

## 2012-05-27 MED ORDER — TAMSULOSIN HCL 0.4 MG PO CAPS: 0.8000 mg | ORAL_CAPSULE | Freq: Every day | ORAL | Status: DC

## 2012-05-27 NOTE — Telephone Encounter (Signed)
Patient requesting refill on his [name]. Has appointment with Dr. Elwyn Reach on 06/03/2012 but is out of this medication.  Requesting refill. [name]

## 2012-06-03 ENCOUNTER — Ambulatory Visit (INDEPENDENT_AMBULATORY_CARE_PROVIDER_SITE_OTHER): Payer: Medicaid Other | Admitting: Emergency Medicine

## 2012-06-03 ENCOUNTER — Encounter: Payer: Self-pay | Admitting: Emergency Medicine

## 2012-06-03 VITALS — BP 106/67 | HR 67 | Ht 68.0 in | Wt 168.9 lb

## 2012-06-03 DIAGNOSIS — F17200 Nicotine dependence, unspecified, uncomplicated: Secondary | ICD-10-CM

## 2012-06-03 DIAGNOSIS — I10 Essential (primary) hypertension: Secondary | ICD-10-CM

## 2012-06-03 DIAGNOSIS — B1710 Acute hepatitis C without hepatic coma: Secondary | ICD-10-CM

## 2012-06-03 MED ORDER — BENZTROPINE MESYLATE 1 MG PO TABS: 1.0000 mg | ORAL_TABLET | Freq: Two times a day (BID) | ORAL | Status: DC

## 2012-06-03 NOTE — Assessment & Plan Note (Signed)
Working on cutting back to 1-2 cigarettes a day.  Provided encouragement.

## 2012-06-03 NOTE — Progress Notes (Signed)
  Subjective:    Patient ID: [name], male    DOB: 28-Feb-1951, 61 y.o.   MRN: [id number]  HPI [name] is here for routine care.  [name] is here to review medications and meet his new doctor. I have reviewed and updated the following as appropriate: allergies, current medications, past family history, past medical history, past social history and past surgical history SHx: current smoker  Hypertension Well controlled: yes Compliant with medication: yes Side effects from medication: no Check BP at home: no  Chest pain: no Palpitations: no Vision changes: no Leg edema: no Dizziness: no  Hepatitis C States was seen by a liver doctor last year.  He would like to see them again to discuss treatment.    Review of Systems See HPI    Objective:   Physical Exam BP 106/67  Pulse 67  Ht 5\' 8"  (1.727 m)  Wt 168 lb 14.4 oz (76.613 kg)  BMI 25.68 kg/m2 Gen: alert, cooperative, NAD HEENT: AT/[place], sclera white, MMM CV: RRR, no murmurs Pulm: CTAB, no wheezes or rales Ext: no edema     Assessment & Plan:

## 2012-06-03 NOTE — Assessment & Plan Note (Signed)
Well controlled. Continue current medications  

## 2012-06-03 NOTE — Assessment & Plan Note (Signed)
Has been seen previously in the [facility] by Dr. Jacqualine Mau.  The patient would like to continue discussions about possible treatment.  I called the office and scheduled an appointment for him on 11/13 at 1:30pm.  Per the receptionist, although the doctors are changing, he does not need a new referral.

## 2012-06-03 NOTE — Patient Instructions (Addendum)
It was good to meet you! We are going to continue your current medications as everything is going well. You have an appointment with Liver doctor on November 13th at 1:30pm.  They are located at [address].  They will send you some forms to fill out and return in the mail. Follow up with me in about 3 months.

## 2012-07-05 ENCOUNTER — Other Ambulatory Visit (HOSPITAL_COMMUNITY): Payer: Self-pay

## 2012-07-05 DIAGNOSIS — B182 Chronic viral hepatitis C: Secondary | ICD-10-CM

## 2012-07-06 ENCOUNTER — Ambulatory Visit (INDEPENDENT_AMBULATORY_CARE_PROVIDER_SITE_OTHER): Payer: Medicaid Other | Admitting: *Deleted

## 2012-07-06 DIAGNOSIS — Z23 Encounter for immunization: Secondary | ICD-10-CM

## 2012-07-11 ENCOUNTER — Ambulatory Visit (HOSPITAL_COMMUNITY): Payer: Medicaid Other

## 2012-07-18 ENCOUNTER — Encounter: Payer: Self-pay | Admitting: Home Health Services

## 2012-07-18 ENCOUNTER — Ambulatory Visit (HOSPITAL_COMMUNITY)
Admission: RE | Admit: 2012-07-18 | Discharge: 2012-07-18 | Disposition: A | Discharge status: Home / Self Care | Payer: Medicaid Other | Source: Ambulatory Visit | Attending: Internal Medicine | Admitting: Internal Medicine

## 2012-07-18 DIAGNOSIS — Q619 Cystic kidney disease, unspecified: Secondary | ICD-10-CM | POA: Insufficient documentation

## 2012-07-18 DIAGNOSIS — I10 Essential (primary) hypertension: Secondary | ICD-10-CM | POA: Insufficient documentation

## 2012-07-18 DIAGNOSIS — B182 Chronic viral hepatitis C: Secondary | ICD-10-CM

## 2012-07-18 DIAGNOSIS — K7689 Other specified diseases of liver: Secondary | ICD-10-CM | POA: Insufficient documentation

## 2012-09-09 ENCOUNTER — Emergency Department (HOSPITAL_COMMUNITY)
Admission: EM | Admit: 2012-09-09 | Discharge: 2012-09-09 | Disposition: A | Discharge status: Home / Self Care | Payer: Medicaid Other | Attending: Emergency Medicine | Admitting: Emergency Medicine

## 2012-09-09 ENCOUNTER — Encounter (HOSPITAL_COMMUNITY): Payer: Self-pay | Admitting: *Deleted

## 2012-09-09 ENCOUNTER — Emergency Department (HOSPITAL_COMMUNITY): Payer: Medicaid Other

## 2012-09-09 DIAGNOSIS — Z8619 Personal history of other infectious and parasitic diseases: Secondary | ICD-10-CM | POA: Insufficient documentation

## 2012-09-09 DIAGNOSIS — F329 Major depressive disorder, single episode, unspecified: Secondary | ICD-10-CM | POA: Insufficient documentation

## 2012-09-09 DIAGNOSIS — Z8669 Personal history of other diseases of the nervous system and sense organs: Secondary | ICD-10-CM | POA: Insufficient documentation

## 2012-09-09 DIAGNOSIS — Z860100 Personal history of colon polyps, unspecified: Secondary | ICD-10-CM | POA: Insufficient documentation

## 2012-09-09 DIAGNOSIS — F17200 Nicotine dependence, unspecified, uncomplicated: Secondary | ICD-10-CM | POA: Insufficient documentation

## 2012-09-09 DIAGNOSIS — F3289 Other specified depressive episodes: Secondary | ICD-10-CM | POA: Insufficient documentation

## 2012-09-09 DIAGNOSIS — I10 Essential (primary) hypertension: Secondary | ICD-10-CM | POA: Insufficient documentation

## 2012-09-09 DIAGNOSIS — Z87448 Personal history of other diseases of urinary system: Secondary | ICD-10-CM | POA: Insufficient documentation

## 2012-09-09 DIAGNOSIS — Z8659 Personal history of other mental and behavioral disorders: Secondary | ICD-10-CM | POA: Insufficient documentation

## 2012-09-09 DIAGNOSIS — Z79899 Other long term (current) drug therapy: Secondary | ICD-10-CM | POA: Insufficient documentation

## 2012-09-09 DIAGNOSIS — R079 Chest pain, unspecified: Secondary | ICD-10-CM

## 2012-09-09 DIAGNOSIS — R0789 Other chest pain: Secondary | ICD-10-CM | POA: Insufficient documentation

## 2012-09-09 LAB — BASIC METABOLIC PANEL
BUN: 18 mg/dL (ref 6–23)
CO2: 28 mEq/L (ref 19–32)
Calcium: 9 mg/dL (ref 8.4–10.5)
Chloride: 102 mEq/L (ref 96–112)
Creatinine, Ser: 1.39 mg/dL — ABNORMAL HIGH (ref 0.50–1.35)
GFR calc Af Amer: 62 mL/min — ABNORMAL LOW (ref >90)
GFR calc non Af Amer: 53 mL/min — ABNORMAL LOW (ref >90)
Glucose, Bld: 77 mg/dL (ref 70–99)
Potassium: 4 mEq/L (ref 3.5–5.1)
Sodium: 139 mEq/L (ref 135–145)

## 2012-09-09 LAB — CBC
HCT: 40.4 % (ref 39.0–52.0)
Hemoglobin: 14.2 g/dL (ref 13.0–17.0)
MCH: 28.7 pg (ref 26.0–34.0)
MCHC: 35.1 g/dL (ref 30.0–36.0)
MCV: 81.8 fL (ref 78.0–100.0)
Platelets: 128 10*3/uL — ABNORMAL LOW (ref 150–400)
RBC: 4.94 MIL/uL (ref 4.22–5.81)
RDW: 13.8 % (ref 11.5–15.5)
WBC: 6.7 10*3/uL (ref 4.0–10.5)

## 2012-09-09 LAB — TROPONIN I: Troponin I: <0.3 ng/mL (ref <0.30)

## 2012-09-09 MED ORDER — OMEPRAZOLE 20 MG PO CPDR: 20.0000 mg | DELAYED_RELEASE_CAPSULE | Freq: Every day | ORAL | Status: DC

## 2012-09-09 NOTE — ED Provider Notes (Signed)
History     CSN: [id number]  Arrival date & time 09/09/12  1514   First MD Initiated Contact with Patient 09/09/12 1526      Chief Complaint  Patient presents with  . Chest Pain    (Consider location/radiation/quality/duration/timing/severity/associated sxs/prior treatment) The history is provided by the patient.   patient reports intermittent discomfort in his chest for the past 3 weeks without vomiting or shortness of breath.  He states it feels like "a bubble" in his chest.  His symptoms come for several minutes and resolved.  He has no prior history of cardiac disease.  He does have a history of hypertension and tobacco abuse.  No family history of early cardiac disease.  His symptoms are mild to moderate in severity when they occur.  Currently is without any symptoms.  He has no exertional chest pain.  He has no dyspnea on exertion.   Past Medical History  Diagnosis Date  . Hypertension   . Hepatitis C   . Depression   . BPH (benign prostatic hyperplasia)   . Schizophrenia   . Retinal vein occlusion   . Colon polyp 2010  . Prostate atrophy     Past Surgical History  Procedure Date  . Circumcision 1959    Family History  Problem Relation Age of Onset  . Hypertension Mother   . Diabetes Mother     History  Substance Use Topics  . Smoking status: Current Every Day Smoker -- 0.3 packs/day    Types: Cigarettes  . Smokeless tobacco: Never Used     Comment: decreased smoking  . Alcohol Use: No      Review of Systems  All other systems reviewed and are negative.    Allergies  Penicillins  Home Medications   Current Outpatient Rx  Name  Route  Sig  Dispense  Refill  . AMLODIPINE BESYLATE 10 MG PO TABS   Oral   Take 10 mg by mouth daily.         Marland Kitchen BENZTROPINE MESYLATE 1 MG PO TABS   Oral   Take 1 tablet (1 mg total) by mouth 2 (two) times daily.   60 tablet   6   . HYDROXYZINE PAMOATE 50 MG PO CAPS   Oral   Take 50 mg by mouth daily. For  itching         . LISINOPRIL 10 MG PO TABS   Oral   Take 1 tablet (10 mg total) by mouth daily.   30 tablet   0   . ADULT MULTIVITAMIN W/MINERALS CH   Oral   Take 1 tablet by mouth daily. Geritol         . TAMSULOSIN HCL 0.4 MG PO CAPS   Oral   Take 2 capsules (0.8 mg total) by mouth daily.   60 capsule   0   . FLUPHENAZINE DECANOATE 25 MG/ML IJ SOLN   Intramuscular   Inject 25 mg into the muscle every 14 (fourteen) days. Last dose 09/07/12           BP 108/72  Pulse 62  Temp 97.8 F (36.6 C) (Oral)  Resp 16  SpO2 100%  Physical Exam  Nursing note and vitals reviewed. Constitutional: He is oriented to person, place, and time. He appears well-developed and well-nourished.  HENT:  Head: Normocephalic and atraumatic.  Eyes: EOM are normal.  Neck: Normal range of motion.  Cardiovascular: Normal rate, regular rhythm, normal heart sounds and intact distal pulses.  Pulmonary/Chest: Effort normal and breath sounds normal. No respiratory distress.  Abdominal: Soft. He exhibits no distension. There is no tenderness.  Musculoskeletal: Normal range of motion.  Neurological: He is alert and oriented to person, place, and time.  Skin: Skin is warm and dry.  Psychiatric: He has a normal mood and affect. Judgment normal.    ED Course  Procedures (including critical care time)  Labs Reviewed  CBC - Abnormal; Notable for the following:    Platelets 128 (*)     All other components within normal limits  BASIC METABOLIC PANEL - Abnormal; Notable for the following:    Creatinine, Ser 1.39 (*)     GFR calc non Af Amer 53 (*)     GFR calc Af Amer 62 (*)     All other components within normal limits  TROPONIN I   Dg Chest 2 View  09/09/2012  *RADIOLOGY REPORT*  Clinical Data: Chest pain  CHEST - 2 VIEW  Comparison: 11/17/2008  Findings: Cardiomediastinal silhouette is stable.  No acute infiltrate or pleural effusion.  No pulmonary edema.  Bony thorax is unremarkable.   IMPRESSION: No active disease.  No significant change.   Original Report Authenticated By: Natasha Mead, M.D.    I personally reviewed the imaging tests through PACS system I reviewed available ER/hospitalization records through the EMR   1. Chest pain     Date: 09/09/2012  Rate: 61  Rhythm: normal sinus rhythm  QRS Axis: normal  Intervals: normal  ST/T Wave abnormalities: normal  Conduction Disutrbances: none  Narrative Interpretation:   Old EKG Reviewed: No significant changes noted      MDM  Very atypical chest pain story.  Labs x-ray and EKG are normal.  I put the patient on Prilosec for what sounds like possible esophageal spasm.  Discharge home in good condition.  PCP followup        Lyanne Co, MD 09/09/12 1815

## 2012-09-09 NOTE — ED Notes (Signed)
The pt has had a sl cough no temp

## 2012-09-09 NOTE — ED Notes (Signed)
The pt is c/o upper chest pain  Bi-laterally for 3 weeks no nv no sob.  No distress

## 2012-09-13 ENCOUNTER — Other Ambulatory Visit: Payer: Self-pay | Admitting: Family Medicine

## 2012-09-15 ENCOUNTER — Other Ambulatory Visit: Payer: Self-pay | Admitting: Emergency Medicine

## 2012-09-15 MED ORDER — LISINOPRIL 10 MG PO TABS: 10.0000 mg | ORAL_TABLET | Freq: Every day | ORAL | Status: DC

## 2012-09-15 NOTE — Telephone Encounter (Signed)
Forward to PCP for refill

## 2012-09-15 NOTE — Telephone Encounter (Signed)
Prescription sent electronically

## 2012-09-15 NOTE — Telephone Encounter (Signed)
Patient is calling about a refill on his Lisinopril.  [facility] on [address] sent it to the wrong doctors office and he is out of his medication and really would like it today.

## 2012-09-21 ENCOUNTER — Encounter: Payer: Self-pay | Admitting: Emergency Medicine

## 2012-09-21 ENCOUNTER — Ambulatory Visit (INDEPENDENT_AMBULATORY_CARE_PROVIDER_SITE_OTHER): Payer: Medicaid Other | Admitting: Emergency Medicine

## 2012-09-21 VITALS — BP 111/72 | HR 76 | Ht 69.0 in | Wt 168.0 lb

## 2012-09-21 DIAGNOSIS — I10 Essential (primary) hypertension: Secondary | ICD-10-CM

## 2012-09-21 DIAGNOSIS — Z7189 Other specified counseling: Secondary | ICD-10-CM

## 2012-09-21 DIAGNOSIS — Z716 Tobacco abuse counseling: Secondary | ICD-10-CM

## 2012-09-21 DIAGNOSIS — B1710 Acute hepatitis C without hepatic coma: Secondary | ICD-10-CM

## 2012-09-21 DIAGNOSIS — F17200 Nicotine dependence, unspecified, uncomplicated: Secondary | ICD-10-CM

## 2012-09-21 DIAGNOSIS — F200 Paranoid schizophrenia: Secondary | ICD-10-CM

## 2012-09-21 NOTE — Assessment & Plan Note (Signed)
Well controlled.  Continue lisinopril and amlodipine.  Follow up 3 months.

## 2012-09-21 NOTE — Assessment & Plan Note (Signed)
Encouraged patient to think about quitting.

## 2012-09-21 NOTE — Assessment & Plan Note (Signed)
Seen by hepatology.  Has follow up with them coming up in the next month or so.

## 2012-09-21 NOTE — Progress Notes (Signed)
  Subjective:    Patient ID: [name], male    DOB: Dec 02, 1950, 62 y.o.   MRN: [id number]  HPI [name] is here for f/u and annual exam.  Hypertension Well controlled: yes Compliant with medication: yes Side effects from medication: no Check BP at home: no  Chest pain: no Palpitations: no Vision changes: no Leg edema: no Dizziness: no   I have reviewed and updated the following as appropriate: allergies, current medications, past family history, past medical history, past social history and past surgical history PMHx: HTN, schizophrenia, BPH  SHx: current smoker  Review of Systems See HPI    Objective:   Physical Exam BP 111/72  Pulse 76  Ht 5\' 9"  (1.753 m)  Wt 168 lb (76.204 kg)  BMI 24.81 kg/m2 Gen: alert, cooperative, NAD HEENT: AT/[place], sclera white, MMM Neck: supple CV: RRR, no murmurs Pulm: CTAB, no wheezes or rales Ext: no edema     Assessment & Plan:

## 2012-09-21 NOTE — Patient Instructions (Addendum)
It was nice to see you! Everything is going well.  We are not going to make any changes today. Please work on getting off the cigarettes! I will contact Gregary Signs and see what I need to do. Follow up in 3 months.

## 2012-09-21 NOTE — Assessment & Plan Note (Signed)
Stable.  Interested in getting help to find a job.  Provided the name [name] from [facility] and the number [phone] as potentially having a CM that could help with this.  I have left a message with [name] asking for more information.

## 2012-10-17 ENCOUNTER — Ambulatory Visit: Payer: Medicaid Other | Admitting: Emergency Medicine

## 2012-10-26 ENCOUNTER — Ambulatory Visit (HOSPITAL_COMMUNITY): Admission: RE | Admit: 2012-10-26 | Payer: Medicaid Other | Source: Home / Self Care | Admitting: Psychiatry

## 2012-10-27 ENCOUNTER — Encounter: Payer: Self-pay | Admitting: Emergency Medicine

## 2012-10-27 ENCOUNTER — Ambulatory Visit (INDEPENDENT_AMBULATORY_CARE_PROVIDER_SITE_OTHER): Payer: Medicaid Other | Admitting: Emergency Medicine

## 2012-10-27 VITALS — BP 120/74 | HR 72 | Ht 68.0 in | Wt 166.0 lb

## 2012-10-27 DIAGNOSIS — F17200 Nicotine dependence, unspecified, uncomplicated: Secondary | ICD-10-CM

## 2012-10-27 DIAGNOSIS — B1710 Acute hepatitis C without hepatic coma: Secondary | ICD-10-CM

## 2012-10-27 DIAGNOSIS — Z7189 Other specified counseling: Secondary | ICD-10-CM

## 2012-10-27 DIAGNOSIS — Z716 Tobacco abuse counseling: Secondary | ICD-10-CM

## 2012-10-27 DIAGNOSIS — I10 Essential (primary) hypertension: Secondary | ICD-10-CM

## 2012-10-27 DIAGNOSIS — Z7182 Exercise counseling: Secondary | ICD-10-CM

## 2012-10-27 NOTE — Assessment & Plan Note (Signed)
Has planned quit date of Mother's Day.

## 2012-10-27 NOTE — Assessment & Plan Note (Signed)
Followed by hepatologist.  Not planning on starting treatment at this time.

## 2012-10-27 NOTE — Progress Notes (Signed)
  Subjective:    Patient ID: [name], male    DOB: 07/09/1951, 62 y.o.   MRN: [id number]  HPI GERRALD BASU is here to discuss starting jogging.  He would like to know if it is okay for him to start jogging again.  He was recently seen in the ED for chest pain, with normal EKG and troponin, diagnosed with heartburn and started on prilosec.  He denies further chest pain.  No syncope.  Does get a little lightheaded sometimes with stairs, but this resolves within a few seconds.  He has seen his liver doctor and they are not going to start treatment at this time for Hep C.  Last month, he had asked about CM for job assistance.  I was unable to reach Celeste for more information.  Today, he has a card that states he may qualify for CM services via [facility], [place], he thinks the person he talked to was [place].    I have reviewed and updated the following as appropriate: allergies and current medications SHx: current smoker, at 2-3 cigarettes per day - planned quit date of Mother's Day   Review of Systems See HPI    Objective:   Physical Exam BP 120/74  Pulse 72  Ht 5\' 8"  (1.727 m)  Wt 166 lb (75.297 kg)  BMI 25.25 kg/m2 Gen: alert, cooperative, NAD HEENT: AT/[place], sclera white, MMM Neck: supple CV: RRR, no murmurs Pulm: CTAB, no wheezes or rales     Assessment & Plan:  Exercise Counseling: Okay for him to start jogging.  Discussed starting slow and gradually building up.  Reviewed warning signs of chest pain with activity and out of proportion dyspnea as reasons to return.    Case management: I will send a note to Theresia Bough regarding [facility] and if there are any other programs available for him.  Follow up in 2-3 months.

## 2012-10-27 NOTE — Assessment & Plan Note (Signed)
Well controlled.  Will follow up in 2-3 months.

## 2012-10-27 NOTE — Patient Instructions (Addendum)
It was nice to see you! It is okay for you to start jogging.  Please start out slowly, only running for 5 minutes and gradually work up. If you have chest pain when you run or you think you get more out of breath than you should, please come back and see me. I am going to talk to our social worker about possible job assistance programs.  She may give you a call in the next few weeks. I will see you back in early June or sooner if needed.

## 2012-11-10 ENCOUNTER — Other Ambulatory Visit: Payer: Self-pay | Admitting: Family Medicine

## 2012-11-10 ENCOUNTER — Telehealth: Payer: Self-pay | Admitting: Clinical

## 2012-11-10 NOTE — Telephone Encounter (Signed)
Clinical Child psychotherapist (CSW) received a referral to provide pt with resources for employment. CSW attempted to reach pt on his cell phone however the number was disconnected. CSW contacted the number listed for his wife however pt mother in law answered and stated she would have pt return CSW call.  Theresia Bough, MSW, Theresia Majors [phone]

## 2012-11-14 ENCOUNTER — Emergency Department (HOSPITAL_COMMUNITY)
Admission: EM | Admit: 2012-11-14 | Discharge: 2012-11-14 | Disposition: A | Discharge status: Home / Self Care | Payer: Medicaid Other | Attending: Emergency Medicine | Admitting: Emergency Medicine

## 2012-11-14 ENCOUNTER — Encounter (HOSPITAL_COMMUNITY): Payer: Self-pay | Admitting: Emergency Medicine

## 2012-11-14 ENCOUNTER — Emergency Department (HOSPITAL_COMMUNITY): Payer: Medicaid Other

## 2012-11-14 DIAGNOSIS — M7652 Patellar tendinitis, left knee: Secondary | ICD-10-CM

## 2012-11-14 DIAGNOSIS — Z79899 Other long term (current) drug therapy: Secondary | ICD-10-CM | POA: Insufficient documentation

## 2012-11-14 DIAGNOSIS — I10 Essential (primary) hypertension: Secondary | ICD-10-CM | POA: Insufficient documentation

## 2012-11-14 DIAGNOSIS — Z860100 Personal history of colon polyps, unspecified: Secondary | ICD-10-CM | POA: Insufficient documentation

## 2012-11-14 DIAGNOSIS — F17200 Nicotine dependence, unspecified, uncomplicated: Secondary | ICD-10-CM | POA: Insufficient documentation

## 2012-11-14 DIAGNOSIS — Z8619 Personal history of other infectious and parasitic diseases: Secondary | ICD-10-CM | POA: Insufficient documentation

## 2012-11-14 DIAGNOSIS — G8929 Other chronic pain: Secondary | ICD-10-CM | POA: Insufficient documentation

## 2012-11-14 DIAGNOSIS — Z8679 Personal history of other diseases of the circulatory system: Secondary | ICD-10-CM | POA: Insufficient documentation

## 2012-11-14 DIAGNOSIS — N400 Enlarged prostate without lower urinary tract symptoms: Secondary | ICD-10-CM | POA: Insufficient documentation

## 2012-11-14 DIAGNOSIS — M7650 Patellar tendinitis, unspecified knee: Secondary | ICD-10-CM | POA: Insufficient documentation

## 2012-11-14 DIAGNOSIS — F209 Schizophrenia, unspecified: Secondary | ICD-10-CM | POA: Insufficient documentation

## 2012-11-14 MED ORDER — NAPROXEN 500 MG PO TABS: 500.0000 mg | ORAL_TABLET | Freq: Two times a day (BID) | ORAL | Status: DC

## 2012-11-14 MED ORDER — OXYCODONE-ACETAMINOPHEN 5-325 MG PO TABS: 1.0000 | ORAL_TABLET | ORAL | Status: DC | PRN

## 2012-11-14 NOTE — ED Notes (Signed)
Pt returned from xray

## 2012-11-14 NOTE — ED Provider Notes (Signed)
History  This chart was scribed for Corey Booze, MD by Shari Heritage, ED Scribe. The patient was seen in room TR06C/TR06C. Patient's care was started at 1542.  CSN: [id number]  Arrival date & time 11/14/12  1448   First MD Initiated Contact with Patient 11/14/12 1542      Chief Complaint  Patient presents with  . Knee Pain     The history is provided by the patient. No language interpreter was used.    HPI Comments: [name] is a 62 y.o. male with history of chronic left knee pain, hypertension, schizophrenia, Hepatitis C who presents to the Emergency Department complaining of moderate, aching anterior left knee pain onset 2.5 days ago. Patient rates pain as 4/5 at rest and 8/10 while ambulating. Pain is worse while climbing steps and when trying to stand up from a chair. Patient denies any obvious injury or trauma to the knee. 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 There is no fever, chills, nausea, vomiting, numbness or weakness. Patient hasn't spoken with his PCP about this problem. He hasn't been taking any NSAIDs at home for pain relief.   PCP - Despina Hick   Past Medical History  Diagnosis Date  . Hypertension   . Hepatitis C   . Depression   . BPH (benign prostatic hyperplasia)   . Schizophrenia   . Retinal vein occlusion   . Colon polyp 2010  . Prostate atrophy     Past Surgical History  Procedure Laterality Date  . Circumcision  1959    Family History  Problem Relation Age of Onset  . Hypertension Mother   . Diabetes Mother     History  Substance Use Topics  . Smoking status: Current Every Day Smoker -- 0.30 packs/day    Types: Cigarettes  . Smokeless tobacco: Never Used     Comment: decreased smoking  . Alcohol Use: No      Review of Systems  Constitutional: Negative for fever and chills.  Gastrointestinal: Negative for nausea and vomiting.  Neurological: Negative for weakness and numbness.     Allergies  Penicillins  Home Medications   Current Outpatient Rx  Name  Route  Sig  Dispense  Refill  . benztropine (COGENTIN) 1 MG tablet   Oral   Take 1 tablet (1 mg total) by mouth 2 (two) times daily.   60 tablet   6   . fluPHENAZine decanoate (PROLIXIN) 25 MG/ML injection   Intramuscular   Inject 25 mg into the muscle every 14 (fourteen) days. Last dose 09/07/12         . hydrOXYzine (VISTARIL) 50 MG capsule   Oral   Take 50 mg by mouth daily. For itching         . lisinopril (PRINIVIL,ZESTRIL) 10 MG tablet   Oral   Take 1 tablet (10 mg total) by mouth daily.   30 tablet   3   . Multiple Vitamin (MULTIVITAMIN WITH MINERALS) TABS   Oral   Take 1 tablet by mouth daily. Geritol         . Tamsulosin HCl (FLOMAX) 0.4 MG CAPS   Oral   Take 2 capsules (0.8 mg total) by mouth daily.   60 capsule   0     Triage Vitals: BP 106/69  Pulse 72  Temp(Src) 97.7 F (36.5 C) (Oral)  Resp 18  Ht 5\' 9"  (1.753 m)  Wt 170 lb (  77.111 kg)  BMI 25.09 kg/m2  SpO2 98%  Physical Exam  Constitutional: He is oriented to person, place, and time. He appears well-developed and well-nourished.  HENT:  Head: Normocephalic and atraumatic.  Eyes: Conjunctivae and EOM are normal. Pupils are equal, round, and reactive to light.  Neck: Normal range of motion. Neck supple.  Cardiovascular: Normal rate, regular rhythm and normal heart sounds.   Pulmonary/Chest: Effort normal and breath sounds normal.  Musculoskeletal:       Left knee: He exhibits no effusion. Tenderness found. Patellar tendon tenderness noted.  Left knee is tender over the patellar tendon. Tenderness with active and passive flexion/extension of the knee. No instability. Negative Lachman Test and McMurray's Test. No effusion.   Neurological: He is alert and oriented to person, place, and time.  Skin: Skin is warm and dry.    ED Course  Procedures (including critical care time) DIAGNOSTIC STUDIES: Oxygen  Saturation is 98% on room air, normal by my interpretation.    COORDINATION OF CARE: 3:47 PM- Patient informed of current plan for treatment and evaluation and agrees with plan at this time.    Dg Knee Complete 4 Views Left  11/14/2012  *RADIOLOGY REPORT*  Clinical Data: Left knee pain and swelling  LEFT KNEE - COMPLETE 4+ VIEW  Comparison: None.  Findings: No fracture or dislocation is noted.  Joint spaces are intact. No soft tissue abnormality is noted.  IMPRESSION: Normal left knee.   Original Report Authenticated By: Lupita Raider.,  M.D.      1. Patellar tendinitis of left knee       MDM  Distant with patellar tendinitis. He'll be placed in knee immobilizer for comfort and discharged with prescriptions for naproxen and Percocet. Follow up with PCP in one week for reevaluation. He is advised to use ice as needed.      I personally performed the services described in this documentation, which was scribed in my presence. The recorded information has been reviewed and is accurate.      Corey Booze, MD 11/14/12 [phone]

## 2012-11-14 NOTE — ED Notes (Signed)
Pt in x-ray at this time

## 2012-11-14 NOTE — ED Notes (Signed)
Pt sts chronic left knee pain x years with more pain than normal at present; pt denies obvious injury

## 2012-11-16 ENCOUNTER — Encounter: Payer: Self-pay | Admitting: Family Medicine

## 2012-11-16 ENCOUNTER — Ambulatory Visit (INDEPENDENT_AMBULATORY_CARE_PROVIDER_SITE_OTHER): Payer: Medicaid Other | Admitting: Family Medicine

## 2012-11-16 VITALS — BP 111/76 | HR 72 | Temp 97.6°F | Ht 69.0 in | Wt 168.0 lb

## 2012-11-16 DIAGNOSIS — M25569 Pain in unspecified knee: Secondary | ICD-10-CM

## 2012-11-16 DIAGNOSIS — M7650 Patellar tendinitis, unspecified knee: Secondary | ICD-10-CM | POA: Insufficient documentation

## 2012-11-16 DIAGNOSIS — M25562 Pain in left knee: Secondary | ICD-10-CM

## 2012-11-16 NOTE — Progress Notes (Signed)
S: Pt comes in today for SDA for knee pain.  Was seen in ER 3/31 and diagnosed with patella tendinitis; had negative/normal 4 view knee xray, put in knee immobilizer.  Today he reports his pain is slightly better, still 7-8/10.  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 Most recent flare before 4 days ago was in 06/2012.  Starting 3-4 days ago, he had swelling and pain.  No changes in activity, no falls or injuries.  He has been using ice and rubbing alcohol, which help a little.  Also Tylenol, which helped some.  In the ER, he was Rx'ed naproxen and percocet- which he is taking PRN.  He has been using his knee immobilizer.  He has done PT before, >10 years ago, and is interested in this again.   He does have some CKD, baseline Cr appears to be ~ 1.4.   ROS: Per HPI  History  Smoking status  . Current Every Day Smoker -- 0.30 packs/day  . Types: Cigarettes  Smokeless tobacco  . Never Used    Comment: decreased smoking    O:  Filed Vitals:   11/16/12 1046  BP: 111/76  Pulse: 72  Temp: 97.6 F (36.4 C)    Gen: NAD L knee: no swelling/redness, no effusion appreciated, mild TTP posterior knee as well as over proximal patella and patellar tendon; no meniscal TTP, full ROM without increase in pain; no knee laxity- ACL appears intact. No crepitus or abnormal patela tracking    A/P: 62 y.o. male p/w acute on chronic L knee pain -See problem list -f/u in 2-3 weeks with PCP

## 2012-11-16 NOTE — Assessment & Plan Note (Addendum)
Acute on chronic.  Continue Naproxen for 10 day course (CKD with Cr 1.4).  Refer to PT for strengthening. Negative xray, no need for MRI at this time.  Consider ortho referral vs [facility] if not improving with PT.

## 2012-11-16 NOTE — Patient Instructions (Addendum)
It was nice to meet you today.  Keep taking the Naproxen- take it 2 times per day every day for the next 10 days.  We will have you do some physical therapy to see if this helps your knee since it is a chronic problem.  You can also buy some Tylenol Arthritis and take it 3 times per day; it is over the counter.  Come back to see Dr. Elwyn Reach in a few weeks so she can see if you are doing any better.     Knee Pain Knee pain can be a result of an injury or other medical conditions. Treatment will depend on the cause of your pain. HOME CARE  Only take medicine as told by your doctor.  Keep a healthy weight. Being overweight can make the knee hurt more.  Stretch before exercising or playing sports.  If there is constant knee pain, change the way you exercise. Ask your doctor for advice.  Make sure shoes fit well. Choose the right shoe for the sport or activity.  Protect your knees. Wear kneepads if needed.  Rest when you are tired. GET HELP RIGHT AWAY IF:   Your knee pain does not stop.  Your knee pain does not get better.  Your knee joint feels hot to the touch.  You have a temperature by mouth above 102 F (38.9 C), not controlled by medicine.  Your baby is older than 3 months with a rectal temperature of 102 F (38.9 C) or higher.  Your baby is 26 months old or younger with a rectal temperature of 100.4 F (38 C) or higher. MAKE SURE YOU:   Understand these instructions.  Will watch this condition.  Will get help right away if you are not doing well or get worse. Document Released: 10/30/2008 Document Revised: 10/26/2011 Document Reviewed: 10/30/2008 [facility] Patient Information 2013 [place], [place].

## 2012-11-28 ENCOUNTER — Ambulatory Visit: Payer: Medicaid Other | Attending: Family Medicine

## 2012-11-28 DIAGNOSIS — M25569 Pain in unspecified knee: Secondary | ICD-10-CM | POA: Insufficient documentation

## 2012-11-28 DIAGNOSIS — IMO0001 Reserved for inherently not codable concepts without codable children: Secondary | ICD-10-CM | POA: Insufficient documentation

## 2012-12-06 ENCOUNTER — Ambulatory Visit: Payer: Medicaid Other | Admitting: Physical Therapy

## 2012-12-09 ENCOUNTER — Ambulatory Visit (INDEPENDENT_AMBULATORY_CARE_PROVIDER_SITE_OTHER): Payer: Medicaid Other | Admitting: Emergency Medicine

## 2012-12-09 VITALS — BP 144/72 | HR 60 | Ht 69.0 in | Wt 169.2 lb

## 2012-12-09 DIAGNOSIS — M25562 Pain in left knee: Secondary | ICD-10-CM

## 2012-12-09 DIAGNOSIS — M25569 Pain in unspecified knee: Secondary | ICD-10-CM

## 2012-12-09 NOTE — Patient Instructions (Addendum)
It was nice to see you! Take tylenol extra strength 2 tablets 3 times a day as needed. If your knees are not feeling better after you complete your physical therapy visits, let me know and I will refer you to our sports medicine doctors. Follow up in 3 months or sooner as needed.

## 2012-12-09 NOTE — Progress Notes (Signed)
  Subjective:    Patient ID: [name], male    DOB: November 28, 1950, 62 y.o.   MRN: [id number]  HPI [name] is here for f/u of left knee pain.  He was seen in the ED the end of March and by Dr. Fara Boros earlier this month.  He has completed a 10 course of naprosyn and percocet.  Today he states he still has some left knee pain and swelling over the patellar tendon.  Worse with stairs, but able to get around okay.  He specifically stated that he does not want any additional narcotic medicines today with his history of alcohol abuse.  Taking tylenol as needed for pain.  He started PT yesterday and states his knee feels better.  He also mentioned that he and his wife are out of food and his SS check doesn't get here until Monday and his food stamps on Tuesday.  Provided bus passes for him and his wife and the information for [employer] on [address].  I have reviewed and updated the following as appropriate: allergies and current medications SHx: current smoker  Review of Systems See HPI    Objective:   Physical Exam BP 144/72  Pulse 60  Ht 5\' 9"  (1.753 m)  Wt 169 lb 3.2 oz (76.749 kg)  BMI 24.98 kg/m2 Gen: alert, cooperative, NAD Left Knee: no erythema or obvious deformity; mild swelling along patellar tendon, no joint effusion; tender over patellar tendon, very mild tenderness along lateral joint line; McMurrays negative; no laxity of MCL or LCL.     Assessment & Plan:

## 2012-12-09 NOTE — Assessment & Plan Note (Signed)
Agree with prior diagnosis of patellar tendonitis.  With his renal disease and will not continue NSAIDs currently.  Recommended tylenol TID.  Continue with physical therapy.  If pain not resolved after PT will likely refer to sports medicine.

## 2012-12-15 ENCOUNTER — Ambulatory Visit: Payer: Medicaid Other | Attending: Family Medicine

## 2012-12-15 DIAGNOSIS — IMO0001 Reserved for inherently not codable concepts without codable children: Secondary | ICD-10-CM | POA: Insufficient documentation

## 2012-12-15 DIAGNOSIS — M25569 Pain in unspecified knee: Secondary | ICD-10-CM | POA: Insufficient documentation

## 2013-01-05 ENCOUNTER — Ambulatory Visit: Payer: Medicaid Other

## 2013-01-10 ENCOUNTER — Other Ambulatory Visit: Payer: Self-pay | Admitting: *Deleted

## 2013-01-10 MED ORDER — TAMSULOSIN HCL 0.4 MG PO CAPS: 0.8000 mg | ORAL_CAPSULE | Freq: Every day | ORAL | Status: DC

## 2013-01-18 ENCOUNTER — Ambulatory Visit: Payer: Medicaid Other | Attending: Family Medicine | Admitting: Rehabilitation

## 2013-01-18 DIAGNOSIS — M25569 Pain in unspecified knee: Secondary | ICD-10-CM | POA: Insufficient documentation

## 2013-01-18 DIAGNOSIS — IMO0001 Reserved for inherently not codable concepts without codable children: Secondary | ICD-10-CM | POA: Insufficient documentation

## 2013-01-19 ENCOUNTER — Ambulatory Visit: Payer: Medicaid Other | Admitting: Rehabilitation

## 2013-01-25 ENCOUNTER — Encounter: Payer: Self-pay | Admitting: Physical Therapy

## 2013-02-13 ENCOUNTER — Other Ambulatory Visit: Payer: Self-pay | Admitting: *Deleted

## 2013-02-13 MED ORDER — TAMSULOSIN HCL 0.4 MG PO CAPS: 0.8000 mg | ORAL_CAPSULE | Freq: Every day | ORAL | Status: DC

## 2013-02-13 MED ORDER — LISINOPRIL 10 MG PO TABS: 10.0000 mg | ORAL_TABLET | Freq: Every day | ORAL | Status: DC

## 2013-03-07 ENCOUNTER — Encounter (HOSPITAL_COMMUNITY): Payer: Self-pay | Admitting: Emergency Medicine

## 2013-03-07 ENCOUNTER — Emergency Department (HOSPITAL_COMMUNITY)
Admission: EM | Admit: 2013-03-07 | Discharge: 2013-03-07 | Disposition: A | Discharge status: Home / Self Care | Payer: Medicaid Other | Attending: Emergency Medicine | Admitting: Emergency Medicine

## 2013-03-07 DIAGNOSIS — Z8669 Personal history of other diseases of the nervous system and sense organs: Secondary | ICD-10-CM | POA: Insufficient documentation

## 2013-03-07 DIAGNOSIS — Z860100 Personal history of colon polyps, unspecified: Secondary | ICD-10-CM | POA: Insufficient documentation

## 2013-03-07 DIAGNOSIS — I10 Essential (primary) hypertension: Secondary | ICD-10-CM | POA: Insufficient documentation

## 2013-03-07 DIAGNOSIS — M542 Cervicalgia: Secondary | ICD-10-CM | POA: Insufficient documentation

## 2013-03-07 DIAGNOSIS — Z87448 Personal history of other diseases of urinary system: Secondary | ICD-10-CM | POA: Insufficient documentation

## 2013-03-07 DIAGNOSIS — N400 Enlarged prostate without lower urinary tract symptoms: Secondary | ICD-10-CM | POA: Insufficient documentation

## 2013-03-07 DIAGNOSIS — Z880 Allergy status to penicillin: Secondary | ICD-10-CM | POA: Insufficient documentation

## 2013-03-07 DIAGNOSIS — Z8619 Personal history of other infectious and parasitic diseases: Secondary | ICD-10-CM | POA: Insufficient documentation

## 2013-03-07 DIAGNOSIS — Z8659 Personal history of other mental and behavioral disorders: Secondary | ICD-10-CM | POA: Insufficient documentation

## 2013-03-07 DIAGNOSIS — F17200 Nicotine dependence, unspecified, uncomplicated: Secondary | ICD-10-CM | POA: Insufficient documentation

## 2013-03-07 MED ORDER — DIAZEPAM 5 MG PO TABS
5.0000 mg | ORAL_TABLET | Freq: Once | ORAL | Status: AC
  Administered 2013-03-07: 5 mg via ORAL
  Filled 2013-03-07: qty 1

## 2013-03-07 MED ORDER — OXYCODONE-ACETAMINOPHEN 5-325 MG PO TABS: 1.0000 | ORAL_TABLET | Freq: Four times a day (QID) | ORAL | Status: DC | PRN

## 2013-03-07 MED ORDER — DIAZEPAM 5 MG PO TABS: ORAL_TABLET | ORAL | Status: DC

## 2013-03-07 NOTE — ED Notes (Signed)
Pt c/o neck pain x months; pt denies obvious injury

## 2013-03-07 NOTE — ED Provider Notes (Signed)
History  This chart was scribed for Glade Nurse, PA-C working with Enid Skeens, MD by Greggory Stallion, ED scribe. This patient was seen in room TR07C/TR07C and the patient's care was started at 6:14 PM.  CSN: [id number] Arrival date & time 03/07/13  1747  Chief Complaint  Patient presents with  . Neck Pain   The history is provided by the patient. No language interpreter was used.    HPI Comments: [name] is a 62 y.o. male with PMHx of renal dz, HTN, Hep C, who presents to the Emergency Department complaining of gradual onset, intermittent pulling neck pain that started 5-6 weeks ago. He states the pain is worse in the mornings. Comes and goes. Pt rates his pain 5/10 right now but states it is 10/10 some mornings. Pt states the pain is located on the right side of the posterior neck and radiates down his right shoulder blade. Pt denies injury, fever, numbness, tingling, focal deficits, decreased ROM. Pt states he mentioned the pain to his PCP but he thought it was just how he slept at the time. He states he is seeing his PCP again next week. Pt states he has tried massaging grain alcohol and Vicks on his neck with no relief.   Past Medical History  Diagnosis Date  . Hypertension   . Hepatitis C   . Depression   . BPH (benign prostatic hyperplasia)   . Schizophrenia   . Retinal vein occlusion   . Colon polyp 2010  . Prostate atrophy    Past Surgical History  Procedure Laterality Date  . Circumcision  1959   Family History  Problem Relation Age of Onset  . Hypertension Mother   . Diabetes Mother    History  Substance Use Topics  . Smoking status: Current Every Day Smoker -- 0.30 packs/day    Types: Cigarettes  . Smokeless tobacco: Never Used     Comment: decreased smoking  . Alcohol Use: No    Review of Systems  Constitutional: Negative for fever and diaphoresis.  HENT: Positive for neck pain. Negative for neck stiffness.        Right sided neck pain radiating  down shoulder blade  Eyes: Negative for visual disturbance.  Respiratory: Negative for apnea, chest tightness and shortness of breath.   Cardiovascular: Negative for chest pain and palpitations.  Gastrointestinal: Negative for nausea, vomiting, diarrhea and constipation.  Genitourinary: Negative for dysuria.  Musculoskeletal: Negative for gait problem.  Skin: Negative for rash.  Neurological: Negative for dizziness, syncope, weakness, light-headedness, numbness and headaches.    Allergies  Penicillins  Home Medications   Current Outpatient Rx  Name  Route  Sig  Dispense  Refill  . benztropine (COGENTIN) 1 MG tablet   Oral   Take 1 tablet (1 mg total) by mouth 2 (two) times daily.   60 tablet   6   . fluPHENAZine decanoate (PROLIXIN) 25 MG/ML injection   Intramuscular   Inject 25 mg into the muscle every 14 (fourteen) days. Last dose 09/07/12         . hydrOXYzine (VISTARIL) 50 MG capsule   Oral   Take 50 mg by mouth daily. For itching         . lisinopril (PRINIVIL,ZESTRIL) 10 MG tablet   Oral   Take 1 tablet (10 mg total) by mouth daily.   30 tablet   3   . Multiple Vitamin (MULTIVITAMIN WITH MINERALS) TABS   Oral   Take 1  tablet by mouth daily. Geritol         . tamsulosin (FLOMAX) 0.4 MG CAPS   Oral   Take 2 capsules (0.8 mg total) by mouth daily.   60 capsule   0   . vitamin C (ASCORBIC ACID) 500 MG tablet   Oral   Take 500 mg by mouth daily.          BP 134/89  Pulse 75  Temp(Src) 97.9 F (36.6 C) (Oral)  Resp 17  SpO2 98%  Physical Exam  Nursing note and vitals reviewed. Constitutional: He is oriented to person, place, and time. He appears well-developed and well-nourished. No distress.  HENT:  Head: Normocephalic and atraumatic.  Eyes: Conjunctivae and EOM are normal.  Neck: Normal range of motion. Neck supple.  No meningeal signs  Cardiovascular: Normal rate, regular rhythm and normal heart sounds.  Exam reveals no gallop and no  friction rub.   No murmur heard. Pulmonary/Chest: Effort normal and breath sounds normal. No respiratory distress. He has no wheezes. He has no rales. He exhibits no tenderness.  Abdominal: Soft. Bowel sounds are normal. He exhibits no distension. There is no tenderness. There is no rebound and no guarding.  Musculoskeletal: Normal range of motion. He exhibits no edema and no tenderness.  FROM to upper and lower extremities No step-offs noted on C-spine No tenderness to palpation of the spinous processes of the C-spine, T-spine or L-spine Full range of motion of C-spine, T-spine, L-spine, right shoulder Mild tenderness to palpation of the right C-spine paraspinous muscles down the right trapezius   Neurological: He is alert and oriented to person, place, and time. No cranial nerve deficit.  Speech is clear and goal oriented, follows commands Sensation normal to light touch and two point discrimination Moves extremities without ataxia, coordination intact Normal gait and balance Normal strength in upper and lower extremities bilaterally including dorsiflexion and plantar flexion, strong and equal grip strength   Skin: Skin is warm and dry. He is not diaphoretic. No erythema.  Psychiatric: He has a normal mood and affect.    ED Course  Procedures (including critical care time)  DIAGNOSTIC STUDIES: Oxygen Saturation is 98% on RA, normal by my interpretation.    COORDINATION OF CARE: 6:45 PM-Discussed treatment plan which includes ibuprofen and a muscle relaxer with pt at bedside and pt agreed to plan. Advised pt to follow up with his PCP and an orthopaedist.   Labs Reviewed - No data to display No results found. 1. Musculoskeletal neck pain     MDM  Afebrile. Patient without signs of serious head, neck, or back injury. Normal neurological exam. Neurovascularly intact. No concern for closed head injury, lung injury, or intraabdominal injury. Pt ambulates without difficulty or pain.  FROM of neck and upper extremities. Not concerned for meningitis. Likely musculoskeletal in nature. No imaging is indicated at this time. Pt has been instructed to follow up with their doctor if symptoms persist. Home conservative therapies for pain including ice and heat tx have been discussed. Pt is hemodynamically stable and in no acute distress. Pain has been managed & has no complaints prior to dc.    I personally performed the services described in this documentation, which was scribed in my presence. The recorded information has been reviewed and is accurate.    Glade Nurse, PA-C 03/07/13 1929

## 2013-03-09 NOTE — ED Provider Notes (Signed)
Medical screening examination/treatment/procedure(s) were performed by non-physician practitioner and as supervising physician I was immediately available for consultation/collaboration.  Enid Skeens, MD 03/09/13 [phone]

## 2013-03-30 ENCOUNTER — Other Ambulatory Visit: Payer: Self-pay | Admitting: Internal Medicine

## 2013-03-30 DIAGNOSIS — C220 Liver cell carcinoma: Secondary | ICD-10-CM

## 2013-04-03 ENCOUNTER — Other Ambulatory Visit: Payer: Self-pay | Admitting: *Deleted

## 2013-04-03 MED ORDER — TAMSULOSIN HCL 0.4 MG PO CAPS: 0.8000 mg | ORAL_CAPSULE | Freq: Every day | ORAL | Status: DC

## 2013-04-06 ENCOUNTER — Other Ambulatory Visit: Payer: Self-pay | Admitting: Internal Medicine

## 2013-04-06 DIAGNOSIS — C220 Liver cell carcinoma: Secondary | ICD-10-CM

## 2013-04-10 ENCOUNTER — Ambulatory Visit (INDEPENDENT_AMBULATORY_CARE_PROVIDER_SITE_OTHER): Payer: Medicaid Other | Admitting: Emergency Medicine

## 2013-04-10 ENCOUNTER — Encounter: Payer: Self-pay | Admitting: Emergency Medicine

## 2013-04-10 VITALS — BP 144/85 | HR 67 | Ht 67.0 in | Wt 166.0 lb

## 2013-04-10 DIAGNOSIS — Z7189 Other specified counseling: Secondary | ICD-10-CM

## 2013-04-10 DIAGNOSIS — B1710 Acute hepatitis C without hepatic coma: Secondary | ICD-10-CM

## 2013-04-10 DIAGNOSIS — Z716 Tobacco abuse counseling: Secondary | ICD-10-CM

## 2013-04-10 DIAGNOSIS — F17200 Nicotine dependence, unspecified, uncomplicated: Secondary | ICD-10-CM

## 2013-04-10 DIAGNOSIS — I10 Essential (primary) hypertension: Secondary | ICD-10-CM

## 2013-04-10 LAB — BASIC METABOLIC PANEL
BUN: 13 mg/dL (ref 6–23)
CO2: 26 mEq/L (ref 19–32)
Calcium: 8.8 mg/dL (ref 8.4–10.5)
Chloride: 107 mEq/L (ref 96–112)
Creat: 1.24 mg/dL (ref 0.50–1.35)
Glucose, Bld: 57 mg/dL — ABNORMAL LOW (ref 70–99)
Potassium: 3.8 mEq/L (ref 3.5–5.3)
Sodium: 140 mEq/L (ref 135–145)

## 2013-04-10 MED ORDER — LISINOPRIL-HYDROCHLOROTHIAZIDE 10-12.5 MG PO TABS: 1.0000 | ORAL_TABLET | Freq: Every day | ORAL | Status: DC

## 2013-04-10 NOTE — Patient Instructions (Addendum)
It was nice to see you!  I sent in a new blood pressure medicine.  I want you to stop taking the lisinopril and start this new medicine which is lisinopril + HCTZ in one tablet.  Please send me the name of the new drug you are taking for your liver.  Follow up in 1 month to recheck your blood pressure.

## 2013-04-10 NOTE — Assessment & Plan Note (Signed)
He may have started treatment. He will send me the information on the new medicine he is taking.

## 2013-04-10 NOTE — Assessment & Plan Note (Signed)
Mildly elevated. Will change to lisinopril-HCTZ 10-12.5mg  daily. BMP today for renal function. Follow up 1 month.

## 2013-04-10 NOTE — Assessment & Plan Note (Signed)
Plans on quitting in May 2015.

## 2013-04-10 NOTE — Progress Notes (Signed)
  Subjective:    Patient ID: [name], male    DOB: February 16, 1951, 62 y.o.   MRN: [id number]  HPI [name] is here for htn.  Hypertension Well controlled: no  Compliant with medication: yes Side effects from medication: no Check BP at home: no  Chest pain: yes - gets a tingly sensation in the left chest that lasts seconds to a minute; occurs several times a week.  No associated SOB or dizziness. Palpitations: no Vision changes: no Leg edema: no Dizziness: no  Hepatitis C He recently saw his liver doctor.  It sounds like he was started on a medication last December, but he does not remember the name.  He is also getting a CT abd/pelvis.    Tobacco Abuse He reports planning on quitting next spring.  He states that he does better cutting back if his also jogging.  I have reviewed and updated the following as appropriate: allergies and current medications SHx: current smoker   Review of Systems See HPI    Objective:   Physical Exam BP 144/85  Pulse 67  Ht 5\' 7"  (1.702 m)  Wt 166 lb (75.297 kg)  BMI 25.99 kg/m2 Gen: alert, cooperative, NAD HEENT: AT/[place], sclera white, MMM Neck: supple CV: RRR, no murmurs Pulm: CTAB, no wheezes or rales Ext: no edema     Assessment & Plan:

## 2013-04-11 ENCOUNTER — Ambulatory Visit
Admission: RE | Admit: 2013-04-11 | Discharge: 2013-04-11 | Disposition: A | Discharge status: Home / Self Care | Payer: Medicaid Other | Source: Ambulatory Visit | Attending: Internal Medicine | Admitting: Internal Medicine

## 2013-04-11 ENCOUNTER — Other Ambulatory Visit: Payer: Self-pay

## 2013-04-11 DIAGNOSIS — C220 Liver cell carcinoma: Secondary | ICD-10-CM

## 2013-04-11 MED ORDER — IOHEXOL 300 MG/ML  SOLN
100.0000 mL | Freq: Once | INTRAMUSCULAR | Status: AC | PRN
  Administered 2013-04-11: 100 mL via INTRAVENOUS

## 2013-04-27 ENCOUNTER — Ambulatory Visit (INDEPENDENT_AMBULATORY_CARE_PROVIDER_SITE_OTHER): Payer: Medicaid Other | Admitting: Emergency Medicine

## 2013-04-27 ENCOUNTER — Encounter: Payer: Self-pay | Admitting: Emergency Medicine

## 2013-04-27 VITALS — BP 114/82 | HR 90 | Temp 97.9°F | Ht 67.0 in | Wt 161.0 lb

## 2013-04-27 DIAGNOSIS — Z0289 Encounter for other administrative examinations: Secondary | ICD-10-CM

## 2013-04-27 NOTE — Progress Notes (Signed)
  Subjective:    Patient ID: [name], male    DOB: 04-Aug-1951, 62 y.o.   MRN: [id number]  HPI [name] is here for paper work.  He reports needed a letter for his school, [employer].  He missed a lot of classes in 2011 due to feeling poorly and his wife's health.  He needs a letter describing his medical difficulties during this time.  He has also provided his wife's medical records for review.  I have reviewed and updated the following as appropriate: allergies and current medications SHx: current smoker  Review of Systems     Objective:   Physical Exam Gen: alert, cooperative, NAD       Assessment & Plan:  Will review his chart and his wife's records.  Letter will be placed up front and he will stop by tomorrow morning to pick it up.

## 2013-05-12 ENCOUNTER — Encounter: Payer: Self-pay | Admitting: Emergency Medicine

## 2013-05-12 ENCOUNTER — Ambulatory Visit (INDEPENDENT_AMBULATORY_CARE_PROVIDER_SITE_OTHER): Payer: Medicaid Other | Admitting: Emergency Medicine

## 2013-05-12 VITALS — BP 139/91 | HR 64 | Temp 97.5°F | Ht 69.0 in | Wt 161.0 lb

## 2013-05-12 DIAGNOSIS — I10 Essential (primary) hypertension: Secondary | ICD-10-CM

## 2013-05-12 DIAGNOSIS — Z23 Encounter for immunization: Secondary | ICD-10-CM

## 2013-05-12 DIAGNOSIS — M542 Cervicalgia: Secondary | ICD-10-CM

## 2013-05-12 LAB — BASIC METABOLIC PANEL
BUN: 16 mg/dL (ref 6–23)
CO2: 31 mEq/L (ref 19–32)
Calcium: 8.8 mg/dL (ref 8.4–10.5)
Chloride: 103 mEq/L (ref 96–112)
Creat: 1.36 mg/dL — ABNORMAL HIGH (ref 0.50–1.35)
Glucose, Bld: 97 mg/dL (ref 70–99)
Potassium: 3.6 mEq/L (ref 3.5–5.3)
Sodium: 139 mEq/L (ref 135–145)

## 2013-05-12 MED ORDER — CYCLOBENZAPRINE HCL 5 MG PO TABS: 5.0000 mg | ORAL_TABLET | Freq: Every evening | ORAL | Status: DC | PRN

## 2013-05-12 NOTE — Progress Notes (Signed)
  Subjective:    Patient ID: [name], male    DOB: [birth year], 62 y.o.   MRN: [id number]  HPI [name] is here for htn f/u and neck pain.  Hypertension Compliant with medication: yes Side effects from medication: no Check BP at home: no  Chest pain: no Palpitations: no Vision changes: no Leg edema: no Dizziness: no  Neck pain He reports that he will get neck pain and tightness periodically.  States it is starting in his right neck today.  Sometimes it will be on the right and sometimes on the left.  It will limit his neck motion, particularly rotational.  Has been treated with valium and percocet by the ED 2 months ago.  No radiation.  No numbness, tingling or weakness.  I have reviewed and updated the following as appropriate: allergies and current medications SHx: current smoker - down to 3 cigarettes a day; quit date 12/2013   Review of Systems See HPI    Objective:   Physical Exam BP 139/91  Pulse 64  Temp(Src) 97.5 F (36.4 C) (Oral)  Ht 5\' 9"  (1.753 m)  Wt 161 lb (73.029 kg)  BMI 23.76 kg/m2 Gen: alert, cooperative, NAD HEENT: AT/[place], sclera white, MMM Neck: no LAD, no erythema or edema, no point tenderness, good active range of motion, slightly limited on bilateral lateral tilt CV: RRR, no murmurs Ext: no edema Neuro: sensation grossly intact to light touch throughout upper extremities; 5/5 grip strength bilaterally     Assessment & Plan:

## 2013-05-12 NOTE — Assessment & Plan Note (Signed)
Has been seen in the ED for this previously. Seems MSK.  No radicular component.  ?torticollis with his psych meds, but exam today not consistent with that. Flexeril 5mg  qHS prn. Discussed ibuprofen 400mg  at start of flair. Follow prn.

## 2013-05-12 NOTE — Assessment & Plan Note (Addendum)
Better on lisinopril-hctz. Continue current medication. Will check BMP today. Follow up in 3 months.

## 2013-05-12 NOTE — Patient Instructions (Addendum)
It was nice to see you!  Keep taking the blood pressure pill.  I sent a prescription for Flexeril to your pharmacy.  Take 1 pill at bedtime if your neck is bothering you. You can also take 2 ibuprofen right when your neck starts to hurt.  Follow up in 3 months or sooner if needed.

## 2013-05-16 ENCOUNTER — Other Ambulatory Visit: Payer: Self-pay | Admitting: Emergency Medicine

## 2013-05-16 MED ORDER — TAMSULOSIN HCL 0.4 MG PO CAPS: 0.8000 mg | ORAL_CAPSULE | Freq: Every day | ORAL | Status: DC

## 2013-06-14 ENCOUNTER — Telehealth: Payer: Self-pay | Admitting: Emergency Medicine

## 2013-06-14 NOTE — Telephone Encounter (Signed)
[name] wanted to have you call him regarding a call he received from Internal Medicine stating that it was the Infectious Disease department and wanted to speak to him.  He is highly upset about this and wanted to talk with you about it.  Please call him back at [phone] or the number given for his cell number.

## 2013-06-15 NOTE — Telephone Encounter (Signed)
Attempted to call patient at both numbers.  I was unable to reach him or leave a message.  I am unsure why infectious disease would be calling him, unless it is related to his hepatitis C.

## 2013-06-15 NOTE — Telephone Encounter (Signed)
I will fwd. This message to pt's PCP. Thanks. Lorenda Hatchet, Renato Battles

## 2013-06-26 ENCOUNTER — Ambulatory Visit (INDEPENDENT_AMBULATORY_CARE_PROVIDER_SITE_OTHER): Payer: Medicaid Other | Admitting: Emergency Medicine

## 2013-06-26 ENCOUNTER — Encounter: Payer: Self-pay | Admitting: Emergency Medicine

## 2013-06-26 VITALS — BP 129/87 | HR 101 | Temp 97.9°F | Ht 69.0 in | Wt 169.0 lb

## 2013-06-26 DIAGNOSIS — N138 Other obstructive and reflux uropathy: Secondary | ICD-10-CM

## 2013-06-26 DIAGNOSIS — I10 Essential (primary) hypertension: Secondary | ICD-10-CM

## 2013-06-26 DIAGNOSIS — N401 Enlarged prostate with lower urinary tract symptoms: Secondary | ICD-10-CM

## 2013-06-26 MED ORDER — LISINOPRIL-HYDROCHLOROTHIAZIDE 10-12.5 MG PO TABS: 1.0000 | ORAL_TABLET | Freq: Every day | ORAL | Status: DC

## 2013-06-26 MED ORDER — MAGNESIUM HYDROXIDE 400 MG/5ML PO SUSP: 15.0000 mL | Freq: Every day | ORAL | Status: DC | PRN

## 2013-06-26 MED ORDER — TAMSULOSIN HCL 0.4 MG PO CAPS: 0.8000 mg | ORAL_CAPSULE | Freq: Every day | ORAL | Status: DC

## 2013-06-26 NOTE — Progress Notes (Signed)
  Subjective:    Patient ID: [name], male    DOB: 1950-09-16, 62 y.o.   MRN: [id number]  HPI [name] is here for refill of meds.  He states he is out of his blood pressure and BPH meds.  Reports good compliance with his medications.  BPH symptoms are well controlled on the flomax.  No chest pain, dizziness or leg swelling.  I have reviewed and updated the following as appropriate: allergies and current medications SHx: current smoker   Review of Systems See HPI    Objective:   Physical Exam BP 129/87  Pulse 101  Temp(Src) 97.9 F (36.6 C) (Oral)  Ht 5\' 9"  (1.753 m)  Wt 169 lb (76.658 kg)  BMI 24.95 kg/m2 Gen: alert, cooperative, NAD HEENT: AT/[place], sclera white, MMM Neck: supple CV: RRR, no murmurs Pulm: CTAB, no wheezes or rales Ext: no edema     Assessment & Plan:

## 2013-06-26 NOTE — Assessment & Plan Note (Signed)
Well controlled on flomax 0.8mg  daily. Refill sent today.

## 2013-06-26 NOTE — Assessment & Plan Note (Signed)
Well controlled. Refilled lisinopril-HCTZ.

## 2013-06-26 NOTE — Patient Instructions (Signed)
It was nice to see you!  Please keep taking the lisinopril-HCTZ combination pill - your blood pressure is excellent today!  Please call [phone] to find out if Behavioral Medicine is accepting new patients.  If they are taking new patients, they will let you know what to do next.  Follow up in February.

## 2013-08-15 ENCOUNTER — Encounter: Payer: Self-pay | Admitting: Emergency Medicine

## 2013-08-15 ENCOUNTER — Ambulatory Visit (INDEPENDENT_AMBULATORY_CARE_PROVIDER_SITE_OTHER): Payer: Medicaid Other | Admitting: Emergency Medicine

## 2013-08-15 VITALS — BP 161/97 | HR 59 | Temp 97.5°F | Ht 67.0 in | Wt 169.0 lb

## 2013-08-15 DIAGNOSIS — I10 Essential (primary) hypertension: Secondary | ICD-10-CM

## 2013-08-15 NOTE — Patient Instructions (Signed)
It was nice to see you!  Take your blood pressure medicines every day. Get 30 minutes of moderate activity a day - this should make you a little short of breath. Continue to avoid salt in your diet.  Follow up in 3 months.

## 2013-08-15 NOTE — Progress Notes (Signed)
   Subjective:    Patient ID: [name], male    DOB: [birth year], 62 y.o.   MRN: [id number]  HPI [name] is here for htn.  Hypertension Compliant with medication: yes - missed meds today. Side effects from medication: no Check BP at home: no  Low salt diet: yes Exercise: yes  Chest pain: no Palpitations: no Vision changes: no Leg edema: no Dizziness: no  I have reviewed and updated the following as appropriate: allergies and current medications SHx: current smoker   Review of Systems See HPI    Objective:   Physical Exam BP 161/97  Pulse 59  Temp(Src) 97.5 F (36.4 C) (Oral)  Ht 5\' 7"  (1.702 m)  Wt 169 lb (76.658 kg)  BMI 26.46 kg/m2 Gen: alert, cooperative, NAD HEENT: AT/, sclera white, MMM Neck: supple CV: RRR, no murmurs Pulm: CTAB, no wheezes or rales      Assessment & Plan:

## 2013-08-15 NOTE — Assessment & Plan Note (Signed)
Elevated today, but missed meds and traditionally well controlled. Will continue current medications. Follow up in 3 months.

## 2013-10-20 ENCOUNTER — Encounter: Payer: Self-pay | Admitting: Emergency Medicine

## 2013-10-20 ENCOUNTER — Ambulatory Visit (INDEPENDENT_AMBULATORY_CARE_PROVIDER_SITE_OTHER): Payer: Medicaid Other | Admitting: Emergency Medicine

## 2013-10-20 VITALS — BP 145/95 | HR 73 | Temp 98.1°F | Ht 67.0 in | Wt 170.0 lb

## 2013-10-20 DIAGNOSIS — L299 Pruritus, unspecified: Secondary | ICD-10-CM

## 2013-10-20 LAB — COMPLETE METABOLIC PANEL WITH GFR
ALT: 63 U/L — ABNORMAL HIGH (ref 0–53)
AST: 103 U/L — ABNORMAL HIGH (ref 0–37)
Albumin: 3.6 g/dL (ref 3.5–5.2)
Alkaline Phosphatase: 101 U/L (ref 39–117)
BUN: 16 mg/dL (ref 6–23)
CO2: 29 mEq/L (ref 19–32)
Calcium: 8.5 mg/dL (ref 8.4–10.5)
Chloride: 103 mEq/L (ref 96–112)
Creat: 1.17 mg/dL (ref 0.50–1.35)
GFR, Est African American: 77 mL/min
GFR, Est Non African American: 66 mL/min
Glucose, Bld: 78 mg/dL (ref 70–99)
Potassium: 3.9 mEq/L (ref 3.5–5.3)
Sodium: 139 mEq/L (ref 135–145)
Total Bilirubin: 3.5 mg/dL — ABNORMAL HIGH (ref 0.2–1.2)
Total Protein: 6.2 g/dL (ref 6.0–8.3)

## 2013-10-20 MED ORDER — HYDROXYZINE HCL 25 MG PO TABS: 25.0000 mg | ORAL_TABLET | Freq: Three times a day (TID) | ORAL | Status: DC | PRN

## 2013-10-20 NOTE — Assessment & Plan Note (Signed)
Diffuse with no focal rashes. No medication changes recently. He does have hep C and B.  Will check cmp for LFTs. Recommended stopping alcohol rubs. Start cocoa butter lotion BID. Atarax TID prn. F/u in 2 weeks.

## 2013-10-20 NOTE — Progress Notes (Signed)
   Subjective:    Patient ID: [name], male    DOB: 08/13/1951, 63 y.o.   MRN: [id number]  HPI BRAND SIEVER is here for itching.  He states that for the last 10 days he has had diffuse itching, worse on his back.  He denies any rashes or changes in medication.  No fevers or chills.  He has been using vitamin E lotion and alcohol rubs.  He has hep b and hep c, denies any abdominal pain.  Current Outpatient Prescriptions on File Prior to Visit  Medication Sig Dispense Refill  . aspirin 81 MG tablet Take 81 mg by mouth daily.      . benztropine (COGENTIN) 1 MG tablet Take 1 tablet (1 mg total) by mouth 2 (two) times daily.  60 tablet  6  . cyclobenzaprine (FLEXERIL) 5 MG tablet Take 1 tablet (5 mg total) by mouth at bedtime as needed for muscle spasms.  30 tablet  1  . fluPHENAZine decanoate (PROLIXIN) 25 MG/ML injection Inject 25 mg into the muscle every 14 (fourteen) days. Last dose 09/07/12      . lisinopril-hydrochlorothiazide (PRINZIDE,ZESTORETIC) 10-12.5 MG per tablet Take 1 tablet by mouth daily.  90 tablet  3  . magnesium hydroxide (MILK OF MAGNESIA) 400 MG/5ML suspension Take 15 mLs by mouth daily as needed for mild constipation.  360 mL  1  . Multiple Vitamin (MULTIVITAMIN WITH MINERALS) TABS Take 1 tablet by mouth daily.      . tamsulosin (FLOMAX) 0.4 MG CAPS capsule Take 2 capsules (0.8 mg total) by mouth daily.  60 capsule  6  . vitamin C (ASCORBIC ACID) 500 MG tablet Take 500 mg by mouth daily.       No current facility-administered medications on file prior to visit.    I have reviewed and updated the following as appropriate: allergies and current medications SHx: current smoker  Health Maintenance: up to date   Review of Systems See HPI    Objective:   Physical Exam BP 145/95  Pulse 73  Temp(Src) 98.1 F (36.7 C) (Oral)  Ht 5\' 7"  (1.702 m)  Wt 170 lb (77.111 kg)  BMI 26.62 kg/m2 Gen: alert, cooperative, NAD Skin: no rashes; skin diffusely a little dry;  excoriations noted mostly over sides and back     Assessment & Plan:

## 2013-10-20 NOTE — Patient Instructions (Signed)
It was nice to see you!  I'm not sure what is causing your itching. I want you to get a lotion with cocoa butter in it and use it twice a day. Stop the alcohol rubs for now. I sent in a medicine called Atarax.  You can take it 3 times a day for itching.  Follow up with me in 2 weeks.

## 2013-10-24 ENCOUNTER — Telehealth: Payer: Self-pay | Admitting: Emergency Medicine

## 2013-10-24 NOTE — Telephone Encounter (Signed)
Called and left detailed message at the [facility] in [place].  According to the patient, he has not yet started treatment for his Hepatitis C despite that being the plan when he saw hepatology 07/2013.  Lab work from last week shows elevated total bilirubin and mild elevated of AST/ALT.  Requested that the hepatologist give me a call back to discuss the next steps.

## 2013-10-26 ENCOUNTER — Other Ambulatory Visit: Payer: Self-pay | Admitting: Nurse Practitioner

## 2013-10-26 DIAGNOSIS — C220 Liver cell carcinoma: Secondary | ICD-10-CM

## 2013-10-30 ENCOUNTER — Ambulatory Visit
Admission: RE | Admit: 2013-10-30 | Discharge: 2013-10-30 | Disposition: A | Discharge status: Home / Self Care | Payer: Medicaid Other | Source: Ambulatory Visit | Attending: Nurse Practitioner | Admitting: Nurse Practitioner

## 2013-10-30 ENCOUNTER — Ambulatory Visit: Payer: Self-pay | Admitting: Emergency Medicine

## 2013-10-30 DIAGNOSIS — C220 Liver cell carcinoma: Secondary | ICD-10-CM

## 2013-10-31 ENCOUNTER — Other Ambulatory Visit: Payer: Self-pay

## 2013-11-02 ENCOUNTER — Other Ambulatory Visit: Payer: Self-pay | Admitting: Nurse Practitioner

## 2013-11-02 DIAGNOSIS — C220 Liver cell carcinoma: Secondary | ICD-10-CM

## 2013-11-09 ENCOUNTER — Ambulatory Visit: Payer: Self-pay | Admitting: Emergency Medicine

## 2013-11-09 ENCOUNTER — Ambulatory Visit
Admission: RE | Admit: 2013-11-09 | Discharge: 2013-11-09 | Disposition: A | Discharge status: Home / Self Care | Payer: Medicaid Other | Source: Ambulatory Visit | Attending: Nurse Practitioner | Admitting: Nurse Practitioner

## 2013-11-09 DIAGNOSIS — C220 Liver cell carcinoma: Secondary | ICD-10-CM

## 2013-11-09 MED ORDER — IOHEXOL 350 MG/ML SOLN
100.0000 mL | Freq: Once | INTRAVENOUS | Status: AC | PRN
  Administered 2013-11-09: 100 mL via INTRAVENOUS

## 2013-11-21 ENCOUNTER — Ambulatory Visit (INDEPENDENT_AMBULATORY_CARE_PROVIDER_SITE_OTHER): Payer: Medicaid Other | Admitting: Emergency Medicine

## 2013-11-21 ENCOUNTER — Encounter: Payer: Self-pay | Admitting: Emergency Medicine

## 2013-11-21 VITALS — BP 142/100 | HR 100 | Ht 67.0 in | Wt 169.0 lb

## 2013-11-21 DIAGNOSIS — I10 Essential (primary) hypertension: Secondary | ICD-10-CM

## 2013-11-21 DIAGNOSIS — L299 Pruritus, unspecified: Secondary | ICD-10-CM

## 2013-11-21 NOTE — Assessment & Plan Note (Signed)
He has not been taking his medication consistently. Encouraged him to take medicine daily. F/u in 1 month for recheck.

## 2013-11-21 NOTE — Patient Instructions (Signed)
It was nice to see you!  Work on taking your blood pressure medicine every day.   I will see you back to recheck your blood pressure in 1 month.

## 2013-11-21 NOTE — Progress Notes (Signed)
   Subjective:    Patient ID: [name], male    DOB: 10/23/1950, 63 y.o.   MRN: [id number]  HPI KYLE LUPPINO is here for htn f/u.  Hypertension Compliant with medication: no - only taking it intermittently Side effects from medication: no Check BP at home: no  Low salt diet: yes Exercise: yes  Chest pain: no Palpitations: no Vision changes: no Leg edema: no Dizziness: no  Itching He states the itching is much better.  He has been seen at the hepatology clinic and his liver enzymes are improving.  Current Outpatient Prescriptions on File Prior to Visit  Medication Sig Dispense Refill  . aspirin 81 MG tablet Take 81 mg by mouth daily.      . benztropine (COGENTIN) 1 MG tablet Take 1 tablet (1 mg total) by mouth 2 (two) times daily.  60 tablet  6  . cyclobenzaprine (FLEXERIL) 5 MG tablet Take 1 tablet (5 mg total) by mouth at bedtime as needed for muscle spasms.  30 tablet  1  . fluPHENAZine decanoate (PROLIXIN) 25 MG/ML injection Inject 25 mg into the muscle every 14 (fourteen) days. Last dose 09/07/12      . hydrOXYzine (ATARAX/VISTARIL) 25 MG tablet Take 1 tablet (25 mg total) by mouth 3 (three) times daily as needed for itching.  60 tablet  0  . lisinopril-hydrochlorothiazide (PRINZIDE,ZESTORETIC) 10-12.5 MG per tablet Take 1 tablet by mouth daily.  90 tablet  3  . magnesium hydroxide (MILK OF MAGNESIA) 400 MG/5ML suspension Take 15 mLs by mouth daily as needed for mild constipation.  360 mL  1  . Multiple Vitamin (MULTIVITAMIN WITH MINERALS) TABS Take 1 tablet by mouth daily.      . tamsulosin (FLOMAX) 0.4 MG CAPS capsule Take 2 capsules (0.8 mg total) by mouth daily.  60 capsule  6  . vitamin C (ASCORBIC ACID) 500 MG tablet Take 500 mg by mouth daily.       No current facility-administered medications on file prior to visit.    I have reviewed and updated the following as appropriate: allergies and current medications SHx: current smoker  Review of Systems See HPI   Objective:   Physical Exam BP 142/100  Pulse 100  Ht 5\' 7"  (1.702 m)  Wt 169 lb (76.658 kg)  BMI 26.46 kg/m2 Gen: alert, cooperative, NAD HEENT: AT/[place], sclera off-white, MMM Neck: supple CV: RRR, no murmurs Pulm: CTAB, no wheezes or rales Ext: no edema      Assessment & Plan:

## 2013-11-21 NOTE — Assessment & Plan Note (Signed)
Much improved, secondary to liver dysfunction. Will recheck CMP today.

## 2013-11-22 ENCOUNTER — Telehealth: Payer: Self-pay | Admitting: Emergency Medicine

## 2013-11-22 DIAGNOSIS — H547 Unspecified visual loss: Secondary | ICD-10-CM

## 2013-11-22 LAB — COMPLETE METABOLIC PANEL WITH GFR
ALT: 44 U/L (ref 0–53)
AST: 69 U/L — ABNORMAL HIGH (ref 0–37)
Albumin: 3.4 g/dL — ABNORMAL LOW (ref 3.5–5.2)
Alkaline Phosphatase: 102 U/L (ref 39–117)
BUN: 15 mg/dL (ref 6–23)
CO2: 26 mEq/L (ref 19–32)
Calcium: 8.3 mg/dL — ABNORMAL LOW (ref 8.4–10.5)
Chloride: 106 mEq/L (ref 96–112)
Creat: 1.35 mg/dL (ref 0.50–1.35)
GFR, Est African American: 65 mL/min
GFR, Est Non African American: 56 mL/min — ABNORMAL LOW
Glucose, Bld: 87 mg/dL (ref 70–99)
Potassium: 4.1 mEq/L (ref 3.5–5.3)
Sodium: 139 mEq/L (ref 135–145)
Total Bilirubin: 2 mg/dL — ABNORMAL HIGH (ref 0.2–1.2)
Total Protein: 6.1 g/dL (ref 6.0–8.3)

## 2013-11-22 NOTE — Telephone Encounter (Signed)
"  the reason I need an eye dr is my glasses dont work no more. Have to strain my eyes. Need a eye exam and new glasses" The doctor's name is the only one in [facility] that starts with the Letter H

## 2013-11-22 NOTE — Telephone Encounter (Signed)
Called pt and left message with male to call us back. Please ask pt, why he needs the appt and the name of the eye doctor. Thanks. Mauricia Area

## 2013-11-22 NOTE — Telephone Encounter (Signed)
Referral to Dr. Claudean Kinds placed.

## 2013-11-22 NOTE — Telephone Encounter (Signed)
Pt called and needs a referral to [facility]. jw

## 2013-11-22 NOTE — Telephone Encounter (Signed)
Pt called back to say that the doctors name is Dr Claudean Kinds at [facility]. jw

## 2013-11-22 NOTE — Telephone Encounter (Signed)
Will fwd to PCP for referral. Thanks .[name]

## 2013-11-22 NOTE — Telephone Encounter (Signed)
Called pt.

## 2013-11-30 ENCOUNTER — Telehealth: Payer: Self-pay | Admitting: Emergency Medicine

## 2013-11-30 NOTE — Telephone Encounter (Signed)
Unable to LVM to pt. [phone]. I was called [phone] his mother in law was unable to take msg   Appt on 04/29@ 8:45  Dr. [employer]

## 2013-12-05 ENCOUNTER — Telehealth: Payer: Self-pay | Admitting: Emergency Medicine

## 2013-12-05 ENCOUNTER — Other Ambulatory Visit: Payer: Self-pay | Admitting: *Deleted

## 2013-12-05 DIAGNOSIS — I10 Essential (primary) hypertension: Secondary | ICD-10-CM

## 2013-12-05 MED ORDER — LISINOPRIL-HYDROCHLOROTHIAZIDE 10-12.5 MG PO TABS: 1.0000 | ORAL_TABLET | Freq: Every day | ORAL | Status: DC

## 2013-12-05 NOTE — Telephone Encounter (Signed)
Pt called and needs a refill on his Lisinopril. jw  °

## 2013-12-05 NOTE — Telephone Encounter (Signed)
Tried to reach patient again.  No answer.  Pharmacist to explain to patient as well if he calls there again.  [place]

## 2013-12-05 NOTE — Telephone Encounter (Signed)
Spoke with [employer].  Pt is requesting Lisinopril to be refilled.  That has been discontinued and patient has been taking lisinopril/HCTZ and just refilled that med x 2 weeks ago.  Attempted to call patient and explain, but no answer.  Will try again later.  [place]

## 2013-12-15 ENCOUNTER — Encounter: Payer: Self-pay | Admitting: Emergency Medicine

## 2014-01-06 ENCOUNTER — Emergency Department (INDEPENDENT_AMBULATORY_CARE_PROVIDER_SITE_OTHER)
Admission: EM | Admit: 2014-01-06 | Discharge: 2014-01-06 | Disposition: A | Discharge status: Home / Self Care | Payer: Medicaid Other | Source: Home / Self Care | Attending: Family Medicine | Admitting: Family Medicine

## 2014-01-06 ENCOUNTER — Encounter (HOSPITAL_COMMUNITY): Payer: Self-pay | Admitting: Emergency Medicine

## 2014-01-06 DIAGNOSIS — Z760 Encounter for issue of repeat prescription: Secondary | ICD-10-CM

## 2014-01-06 DIAGNOSIS — I10 Essential (primary) hypertension: Secondary | ICD-10-CM

## 2014-01-06 NOTE — ED Provider Notes (Signed)
CSN: [id number]     Arrival date & time 01/06/14  1531 History   First MD Initiated Contact with Patient 01/06/14 1724     Chief Complaint  Patient presents with  . Medication Refill   (Consider location/radiation/quality/duration/timing/severity/associated sxs/prior Treatment) HPI Comments: Patient presents with request for refill of his lisinopril the he reports he takes "for his liver." States he went to his pharmacy to have medication refilled and there were no additional refills available.   The history is provided by the patient.    Past Medical History  Diagnosis Date  . Hypertension   . Hepatitis C   . Depression   . BPH (benign prostatic hyperplasia)   . Schizophrenia   . Retinal vein occlusion   . Colon polyp 2010  . Prostate atrophy    Past Surgical History  Procedure Laterality Date  . Circumcision  1959   Family History  Problem Relation Age of Onset  . Hypertension Mother   . Diabetes Mother    History  Substance Use Topics  . Smoking status: Current Every Day Smoker -- 0.30 packs/day    Types: Cigarettes  . Smokeless tobacco: Never Used     Comment: decreased smoking  . Alcohol Use: No    Review of Systems  All other systems reviewed and are negative.   Allergies  Penicillins  Home Medications   Prior to Admission medications   Medication Sig Start Date End Date Taking? Authorizing Provider  aspirin 81 MG tablet Take 81 mg by mouth daily.   Yes Historical Provider, MD  benztropine (COGENTIN) 1 MG tablet Take 1 tablet (1 mg total) by mouth 2 (two) times daily. 06/03/12  Yes Melony Overly, MD  cyclobenzaprine (FLEXERIL) 5 MG tablet Take 1 tablet (5 mg total) by mouth at bedtime as needed for muscle spasms. 05/12/13  Yes Melony Overly, MD  fluPHENAZine decanoate (PROLIXIN) 25 MG/ML injection Inject 25 mg into the muscle every 14 (fourteen) days. Last dose 09/07/12   Yes Historical Provider, MD  hydrOXYzine (ATARAX/VISTARIL) 25 MG tablet Take 1 tablet  (25 mg total) by mouth 3 (three) times daily as needed for itching. 10/20/13  Yes Melony Overly, MD  lisinopril-hydrochlorothiazide (PRINZIDE,ZESTORETIC) 10-12.5 MG per tablet Take 1 tablet by mouth daily. 12/05/13  Yes Melony Overly, MD  magnesium hydroxide (MILK OF MAGNESIA) 400 MG/5ML suspension Take 15 mLs by mouth daily as needed for mild constipation. 06/26/13  Yes Melony Overly, MD  tamsulosin (FLOMAX) 0.4 MG CAPS capsule Take 2 capsules (0.8 mg total) by mouth daily. 06/26/13  Yes Melony Overly, MD  Multiple Vitamin (MULTIVITAMIN WITH MINERALS) TABS Take 1 tablet by mouth daily.    Historical Provider, MD  vitamin C (ASCORBIC ACID) 500 MG tablet Take 500 mg by mouth daily.    Historical Provider, MD   BP 141/91  Pulse 62  Temp(Src) 98.5 F (36.9 C) (Oral)  Resp 16  SpO2 98% Physical Exam  Nursing note and vitals reviewed. Constitutional: He is oriented to person, place, and time. He appears well-developed and well-nourished. No distress.  HENT:  Head: Normocephalic and atraumatic.  Eyes: Conjunctivae are normal. No scleral icterus.  Cardiovascular: Normal rate.   Pulmonary/Chest: Effort normal.  Musculoskeletal: Normal range of motion.  Neurological: He is alert and oriented to person, place, and time.  Skin: Skin is warm and dry.  Psychiatric: He has a normal mood and affect. His behavior is normal.    ED Course  Procedures (  including critical care time) Labs Review Labs Reviewed - No data to display  Imaging Review No results found.   MDM   1. Encounter for medication refill    Patient seems to have a limited understanding of his medications and the reason he takes eachof his medications. Chart reviewed from [facility] and it would appear the patient's lisinopril was discontinued and he was placed on lisinopril/HCTZ 10/12.5mg . Contacted patient's pharmacy and was advised that patient (approximately 3 weeks ago) did pick up a 90 day supply of lisinopril/HCTZ. Patient reports he  has been taking this medication and simply did not understand that he was not to take old Rx for Lisinopril as well. Printed a new medication report for patient and reviewed his medications with his and these reasons he was taking each one and he now voices a better understanding of his medication regimen.    [place], [place] 01/06/14 2051

## 2014-01-06 NOTE — ED Notes (Signed)
Patient states he was told to make sure he does not run out of "liver medication" and that he is out and came here to get refills for his lisinopril.

## 2014-01-06 NOTE — Discharge Instructions (Signed)
[name], you are to stop taking your Lisinopril 10 mg tablets. Your doctor has stopped this medication. In place of this medication, you are to begin taking Lisinopril-HCTZ 10mg /12.5mg  tablets once a day as it is written on your prescription bottle. I have contacted your pharmacy and they have advised that you have already picked up a 90 day supply of this medication. I hope this clarifies the medication you are suppose to be taking.   Medication Refill, Emergency Department We have refilled your medication today as a courtesy to you. It is best for your medical care, however, to take care of getting refills done through your primary caregiver's office. They have your records and can do a better job of follow-up than we can in the emergency department. On maintenance medications, we often only prescribe enough medications to get you by until you are able to see your regular caregiver. This is a more expensive way to refill medications. In the future, please plan for refills so that you will not have to use the emergency department for this. Thank you for your help. Your help allows [place] to better take care of the daily emergencies that enter our department. Document Released: 11/20/2003 Document Revised: 10/26/2011 Document Reviewed: 08/03/2005 [facility] Patient Information 2014 [place], [place].

## 2014-01-08 NOTE — ED Provider Notes (Signed)
Medical screening examination/treatment/procedure(s) were performed by resident physician or non-physician practitioner and as supervising physician I was immediately available for consultation/collaboration.   Pauline Good MD.   Billy Fischer, MD 01/08/14 1318

## 2014-01-10 ENCOUNTER — Encounter: Payer: Self-pay | Admitting: Emergency Medicine

## 2014-01-10 ENCOUNTER — Ambulatory Visit (INDEPENDENT_AMBULATORY_CARE_PROVIDER_SITE_OTHER): Payer: Medicaid Other | Admitting: Emergency Medicine

## 2014-01-10 VITALS — BP 132/85 | HR 66 | Ht 67.0 in | Wt 172.0 lb

## 2014-01-10 DIAGNOSIS — I10 Essential (primary) hypertension: Secondary | ICD-10-CM

## 2014-01-10 MED ORDER — LISINOPRIL-HYDROCHLOROTHIAZIDE 20-25 MG PO TABS: 1.0000 | ORAL_TABLET | Freq: Every day | ORAL | Status: DC

## 2014-01-10 NOTE — Patient Instructions (Signed)
It was nice to see you!  We are increasing your blood pressure medicine a little. For right now, take 2 of your blood pressure pills once a day.  When you pick up the new prescription, you will go back to 1 pill a day.  Follow up in 1 month to recheck your blood pressure.

## 2014-01-10 NOTE — Progress Notes (Signed)
   Subjective:    Patient ID: [name], male    DOB: Aug 27, 1950, 63 y.o.   MRN: [id number]  HPI [name] is here for blood pressure.  He was seen at urgent care over the weekend to get a refill of his blood pressure medicine. He was a little confused about his blood pressure medicines. He is currently taking lisinopril-HCTZ 10-25 mg tablets once a day. The medicine he thought he needed was a lisinopril tablet. His regimen was clarified at the urgent care. Today he states that his blood pressure has not been well controlled on his current dose. He reports checking his blood pressure daily at the pharmacy, it typically runs in the 591M systolic. No chest pain, palpitations, leg swelling, dizziness.  He also states he has started [facility] for hepatitis C.  Current Outpatient Prescriptions on File Prior to Visit  Medication Sig Dispense Refill  . aspirin 81 MG tablet Take 81 mg by mouth daily.      . benztropine (COGENTIN) 1 MG tablet Take 1 tablet (1 mg total) by mouth 2 (two) times daily.  60 tablet  6  . cyclobenzaprine (FLEXERIL) 5 MG tablet Take 1 tablet (5 mg total) by mouth at bedtime as needed for muscle spasms.  30 tablet  1  . fluPHENAZine decanoate (PROLIXIN) 25 MG/ML injection Inject 25 mg into the muscle every 14 (fourteen) days. Last dose 09/07/12      . hydrOXYzine (ATARAX/VISTARIL) 25 MG tablet Take 1 tablet (25 mg total) by mouth 3 (three) times daily as needed for itching.  60 tablet  0  . magnesium hydroxide (MILK OF MAGNESIA) 400 MG/5ML suspension Take 15 mLs by mouth daily as needed for mild constipation.  360 mL  1  . Multiple Vitamin (MULTIVITAMIN WITH MINERALS) TABS Take 1 tablet by mouth daily.      . tamsulosin (FLOMAX) 0.4 MG CAPS capsule Take 2 capsules (0.8 mg total) by mouth daily.  60 capsule  6  . vitamin C (ASCORBIC ACID) 500 MG tablet Take 500 mg by mouth daily.       No current facility-administered medications on file prior to visit.    I have reviewed  and updated the following as appropriate: allergies and current medications SHx: current smoker  Review of Systems See HPI    Objective:   Physical Exam BP 132/85  Pulse 66  Ht 5\' 7"  (1.702 m)  Wt 172 lb (78.019 kg)  BMI 26.93 kg/m2 Gen: alert, cooperative, NAD      Assessment & Plan:

## 2014-01-10 NOTE — Assessment & Plan Note (Signed)
Well-controlled today, however recent office visits have been elevated and he reports elevated numbers at home. Will increase lisinopril-HCTZ to 20-25 mg daily. Followup in one month for recheck.

## 2014-02-02 ENCOUNTER — Encounter: Payer: Self-pay | Admitting: Emergency Medicine

## 2014-02-02 ENCOUNTER — Ambulatory Visit (INDEPENDENT_AMBULATORY_CARE_PROVIDER_SITE_OTHER): Payer: Medicaid Other | Admitting: Emergency Medicine

## 2014-02-02 VITALS — BP 125/75 | HR 66 | Temp 97.8°F | Wt 175.0 lb

## 2014-02-02 DIAGNOSIS — I10 Essential (primary) hypertension: Secondary | ICD-10-CM

## 2014-02-02 NOTE — Progress Notes (Signed)
   Subjective:    Patient ID: [name], male    DOB: 11-10-1950, 63 y.o.   MRN: [id number]  HPI [name] is here for followup blood pressure.  Hypertension Compliant with medication: yes Side effects from medication: no Check BP at home: yes  BP ranges: 182-993 systolic  Low salt diet: yes - he tries to avoid salt Exercise: yes  Chest pain: no Palpitations: no Vision changes: no Leg edema: no Dizziness: no   Current Outpatient Prescriptions on File Prior to Visit  Medication Sig Dispense Refill  . aspirin 81 MG tablet Take 81 mg by mouth daily.      . benztropine (COGENTIN) 1 MG tablet Take 1 tablet (1 mg total) by mouth 2 (two) times daily.  60 tablet  6  . cyclobenzaprine (FLEXERIL) 5 MG tablet Take 1 tablet (5 mg total) by mouth at bedtime as needed for muscle spasms.  30 tablet  1  . fluPHENAZine decanoate (PROLIXIN) 25 MG/ML injection Inject 25 mg into the muscle every 14 (fourteen) days. Last dose 09/07/12      . hydrOXYzine (ATARAX/VISTARIL) 25 MG tablet Take 1 tablet (25 mg total) by mouth 3 (three) times daily as needed for itching.  60 tablet  0  . Ledipasvir-Sofosbuvir (HARVONI PO) Take by mouth.      Marland Kitchen lisinopril-hydrochlorothiazide (PRINZIDE,ZESTORETIC) 20-25 MG per tablet Take 1 tablet by mouth daily.  90 tablet  3  . magnesium hydroxide (MILK OF MAGNESIA) 400 MG/5ML suspension Take 15 mLs by mouth daily as needed for mild constipation.  360 mL  1  . Multiple Vitamin (MULTIVITAMIN WITH MINERALS) TABS Take 1 tablet by mouth daily.      . tamsulosin (FLOMAX) 0.4 MG CAPS capsule Take 2 capsules (0.8 mg total) by mouth daily.  60 capsule  6  . vitamin C (ASCORBIC ACID) 500 MG tablet Take 500 mg by mouth daily.       No current facility-administered medications on file prior to visit.    I have reviewed and updated the following as appropriate: allergies and current medications SHx: current smoker  Health Maintenance: Encouraged smoking cessation.  Review  of Systems See HPI    Objective:   Physical Exam BP 125/75  Pulse 66  Temp(Src) 97.8 F (36.6 C) (Oral)  Wt 175 lb (79.379 kg) Gen: alert, cooperative, NAD HEENT: AT/[place], sclera white, MMM Neck: supple CV: RRR, no murmurs Pulm: CTAB, no wheezes or rales Ext: no edema     Assessment & Plan:

## 2014-02-02 NOTE — Patient Instructions (Signed)
It was nice to see you!  I want you to keep working on quitting smoking!  Come back in about 3 months to meet your new doctor.

## 2014-02-02 NOTE — Assessment & Plan Note (Signed)
Well-controlled. Continue lisinopril/HCTZ 20-25 mg daily. Followup in 3 months.

## 2014-02-28 ENCOUNTER — Other Ambulatory Visit: Payer: Self-pay | Admitting: *Deleted

## 2014-02-28 MED ORDER — TAMSULOSIN HCL 0.4 MG PO CAPS: 0.8000 mg | ORAL_CAPSULE | Freq: Every day | ORAL | Status: DC

## 2014-03-01 ENCOUNTER — Other Ambulatory Visit: Payer: Self-pay | Admitting: *Deleted

## 2014-03-01 ENCOUNTER — Telehealth: Payer: Self-pay | Admitting: Family Medicine

## 2014-03-01 NOTE — Telephone Encounter (Signed)
Pt called because the pharmacy is faxing [place] refill request for his prostate medication. Can we fill this please. jw

## 2014-03-02 MED ORDER — TAMSULOSIN HCL 0.4 MG PO CAPS: 0.8000 mg | ORAL_CAPSULE | Freq: Every day | ORAL | Status: DC

## 2014-03-09 ENCOUNTER — Ambulatory Visit (INDEPENDENT_AMBULATORY_CARE_PROVIDER_SITE_OTHER): Payer: Medicaid Other | Admitting: Family Medicine

## 2014-03-09 ENCOUNTER — Encounter: Payer: Self-pay | Admitting: Family Medicine

## 2014-03-09 VITALS — BP 108/68 | HR 80 | Temp 98.2°F | Ht 67.0 in | Wt 178.3 lb

## 2014-03-09 DIAGNOSIS — Z716 Tobacco abuse counseling: Secondary | ICD-10-CM

## 2014-03-09 DIAGNOSIS — E785 Hyperlipidemia, unspecified: Secondary | ICD-10-CM

## 2014-03-09 DIAGNOSIS — F17200 Nicotine dependence, unspecified, uncomplicated: Secondary | ICD-10-CM

## 2014-03-09 DIAGNOSIS — I10 Essential (primary) hypertension: Secondary | ICD-10-CM

## 2014-03-09 DIAGNOSIS — Z7189 Other specified counseling: Secondary | ICD-10-CM

## 2014-03-09 LAB — LIPID PANEL
Cholesterol: 205 mg/dL — ABNORMAL HIGH (ref 0–200)
HDL: 32 mg/dL — ABNORMAL LOW (ref >39)
Total CHOL/HDL Ratio: 6.4 Ratio
Triglycerides: 450 mg/dL — ABNORMAL HIGH (ref <150)

## 2014-03-09 NOTE — Assessment & Plan Note (Signed)
Well-controlled, compliant with medications. Continue lisinopril-HCTZ 20-25 mg daily. Will check lipid panel today as last one was checked in 2011. Last seen at within normal limits was in April 2015.  Will recheck in April 2016.

## 2014-03-09 NOTE — Progress Notes (Addendum)
Subjective:   CC: Meet new doctor  HPI:   1. Reviewed problem list and medications with this patient who is new to me.  2. HTN: Patient reports good compliance with lisinopril HCTZ. He occasionally takes his blood pressure at home and reports well controlled blood pressures.  Denies any adverse side effects. Denies chest pain, shortness of breath, and lower extremity edema.  3. Tobacco abuse: Patient reports that he is currently smoking 4-5 cigarettes per day. He is trying to cut back slowly in an effort to quit completely. He does not wish to take any medications or use a patch to help with tobacco cessation.  Review of Systems - Per HPI. Additionally, no N/V/D.  Reviewed PMH, FH, and SH Smoking status: Current smoker Denies any alcohol or illicit drug use.    Objective:  Physical Exam BP 108/68  Pulse 80  Temp(Src) 98.2 F (36.8 C) (Oral)  Ht 5\' 7"  (1.702 m)  Wt 178 lb 4.8 oz (80.876 kg)  BMI 27.92 kg/m2 GEN: Well-appearing African American man sitting in NAD. Neck: No JVD or LAD CV: RRR, no m/r/g Pulm: CTAB, no w/r/c. Normal WOB. Ext: No edema, cyanosis   Lavon Paganini, MD [facility]   Addendum: Lipid panel reviewed (below) on 7/25.    Lipid Panel     Component Value Date/Time   CHOL 205* 03/09/2014 1424   TRIG 450* 03/09/2014 1424   HDL 32* 03/09/2014 1424   CHOLHDL 6.4 03/09/2014 1424   VLDL NOT CALC 03/09/2014 1424   LDLCALC NOT CALC 03/09/2014 1424

## 2014-03-09 NOTE — Patient Instructions (Addendum)
It was very nice to meet you today [name].  I got to review your medical problems in medications today. It seems as though your blood pressure is stable and well controlled on lisinopril HCTZ. We continued this medication. I will check a cholesterol panel today. I will call you or send you a letter about the results within a week.  We also did talk about how you're trying to quit smoking. I have included some information about the advantages of quitting smoking below.  Come back to see me in a year or sooner if you have any issues.  Best, Dr. Jacinto Reap Brita Romp)  Smoking Cessation Quitting smoking is important to your health and has many advantages. However, it is not always easy to quit since nicotine is a very addictive drug. Oftentimes, people try 3 times or more before being able to quit. This document explains the best ways for you to prepare to quit smoking. Quitting takes hard work and a lot of effort, but you can do it. ADVANTAGES OF QUITTING SMOKING  You will live longer, feel better, and live better.  Your body will feel the impact of quitting smoking almost immediately.  Within 20 minutes, blood pressure decreases. Your pulse returns to its normal level.  After 8 hours, carbon monoxide levels in the blood return to normal. Your oxygen level increases.  After 24 hours, the chance of having a heart attack starts to decrease. Your breath, hair, and body stop smelling like smoke.  After 48 hours, damaged nerve endings begin to recover. Your sense of taste and smell improve.  After 72 hours, the body is virtually free of nicotine. Your bronchial tubes relax and breathing becomes easier.  After 2 to 12 weeks, lungs can hold more air. Exercise becomes easier and circulation improves.  The risk of having a heart attack, stroke, cancer, or lung disease is greatly reduced.  After 1 year, the risk of coronary heart disease is cut in half.  After 5 years, the risk of stroke falls to the  same as a nonsmoker.  After 10 years, the risk of lung cancer is cut in half and the risk of other cancers decreases significantly.  After 15 years, the risk of coronary heart disease drops, usually to the level of a nonsmoker.  If you are pregnant, quitting smoking will improve your chances of having a healthy baby.  The people you live with, especially any children, will be healthier.  You will have extra money to spend on things other than cigarettes. QUESTIONS TO THINK ABOUT BEFORE ATTEMPTING TO QUIT You may want to talk about your answers with your health care provider.  Why do you want to quit?  If you tried to quit in the past, what helped and what did not?  What will be the most difficult situations for you after you quit? How will you plan to handle them?  Who can help you through the tough times? Your family? Friends? A health care provider?  What pleasures do you get from smoking? What ways can you still get pleasure if you quit? Here are some questions to ask your health care provider:  How can you help me to be successful at quitting?  What medicine do you think would be best for me and how should I take it?  What should I do if I need more help?  What is smoking withdrawal like? How can I get information on withdrawal? GET READY  Set a quit date.  Change your environment by getting rid of all cigarettes, ashtrays, matches, and lighters in your home, car, or work. Do not let people smoke in your home.  Review your past attempts to quit. Think about what worked and what did not. GET SUPPORT AND ENCOURAGEMENT You have a better chance of being successful if you have help. You can get support in many ways.  Tell your family, friends, and coworkers that you are going to quit and need their support. Ask them not to smoke around you.  Get individual, group, or telephone counseling and support. Programs are available at [employer] and health centers. Call your  local health department for information about programs in your area.  Spiritual beliefs and practices may help some smokers quit.  Download a "quit meter" on your computer to keep track of quit statistics, such as how long you have gone without smoking, cigarettes not smoked, and money saved.  Get a self-help book about quitting smoking and staying off tobacco. Edinburgh yourself from urges to smoke. Talk to someone, go for a walk, or occupy your time with a task.  Change your normal routine. Take a different route to work. Drink tea instead of coffee. Eat breakfast in a different place.  Reduce your stress. Take a hot bath, exercise, or read a book.  Plan something enjoyable to do every day. Reward yourself for not smoking.  Explore interactive web-based programs that specialize in helping you quit. GET MEDICINE AND USE IT CORRECTLY Medicines can help you stop smoking and decrease the urge to smoke. Combining medicine with the above behavioral methods and support can greatly increase your chances of successfully quitting smoking.  Nicotine replacement therapy helps deliver nicotine to your body without the negative effects and risks of smoking. Nicotine replacement therapy includes nicotine gum, lozenges, inhalers, nasal sprays, and skin patches. Some may be available over-the-counter and others require a prescription.  Antidepressant medicine helps people abstain from smoking, but how this works is unknown. This medicine is available by prescription.  Nicotinic receptor partial agonist medicine simulates the effect of nicotine in your brain. This medicine is available by prescription. Ask your health care provider for advice about which medicines to use and how to use them based on your health history. Your health care provider will tell you what side effects to look out for if you choose to be on a medicine or therapy. Carefully read the information on the  package. Do not use any other product containing nicotine while using a nicotine replacement product.  RELAPSE OR DIFFICULT SITUATIONS Most relapses occur within the first 3 months after quitting. Do not be discouraged if you start smoking again. Remember, most people try several times before finally quitting. You may have symptoms of withdrawal because your body is used to nicotine. You may crave cigarettes, be irritable, feel very hungry, cough often, get headaches, or have difficulty concentrating. The withdrawal symptoms are only temporary. They are strongest when you first quit, but they will go away within 10-14 days. To reduce the chances of relapse, try to:  Avoid drinking alcohol. Drinking lowers your chances of successfully quitting.  Reduce the amount of caffeine you consume. Once you quit smoking, the amount of caffeine in your body increases and can give you symptoms, such as a rapid heartbeat, sweating, and anxiety.  Avoid smokers because they can make you want to smoke.  Do not let weight gain distract you. Many smokers will gain  weight when they quit, usually less than 10 pounds. Eat a healthy diet and stay active. You can always lose the weight gained after you quit.  Find ways to improve your mood other than smoking. FOR MORE INFORMATION  www.smokefree.gov  Document Released: 07/28/2001 Document Revised: 12/18/2013 Document Reviewed: 11/12/2011 [facility] Patient Information 2015 [place], [place]. This information is not intended to replace advice given to you by your health care provider. Make sure you discuss any questions you have with your health care provider.

## 2014-03-09 NOTE — Assessment & Plan Note (Signed)
Patient has cut back to only smoking 4-5 cigarettes per day. He is working toward gradually cutting back enough to quit completely.  Counseled patient about the benefits of quitting smoking.  He declines any medications to help with smoking cessation.

## 2014-03-10 DIAGNOSIS — E785 Hyperlipidemia, unspecified: Secondary | ICD-10-CM | POA: Insufficient documentation

## 2014-03-10 MED ORDER — ATORVASTATIN CALCIUM 80 MG PO TABS: 80.0000 mg | ORAL_TABLET | Freq: Every day | ORAL | Status: DC

## 2014-03-10 NOTE — Assessment & Plan Note (Addendum)
-   ASCVD 60yr risk calculated as 20.2%. Warrants high-intensity statin. - Rx for Lipitor 80mg  daily sent to pharmacy and patient notified. - OK with pharmacy to take Lipitor with chronic Hep C and Harvoni treatment. - Triglyceride lowering agents are contraindicated.

## 2014-03-10 NOTE — Addendum Note (Signed)
Addended by: [name] on: 03/10/2014 10:24 AM   Modules accepted: Orders

## 2014-03-15 ENCOUNTER — Telehealth: Payer: Self-pay | Admitting: *Deleted

## 2014-03-15 NOTE — Telephone Encounter (Signed)
Message copied by [place] Pink on Thu Mar 15, 2014  1:53 PM ------      Message from: [name]      Created: Sat Mar 10, 2014 10:22 AM       Please call patient and inform him about lipid panel.  Rx for lipitor sent to pharmacy.  Agents to lower his triglycerides would be contraindicated with his Hep C and treatment.  Ask him to start taking this and make an appt to f/u with me in clinic in 1 months. ------

## 2014-03-15 NOTE — Telephone Encounter (Signed)
Left message to return call. Please read message below. [name], [name]

## 2014-03-20 ENCOUNTER — Other Ambulatory Visit: Payer: Self-pay | Admitting: Nurse Practitioner

## 2014-03-20 DIAGNOSIS — C220 Liver cell carcinoma: Secondary | ICD-10-CM

## 2014-04-24 ENCOUNTER — Other Ambulatory Visit: Payer: Medicaid Other

## 2014-05-04 ENCOUNTER — Ambulatory Visit
Admission: RE | Admit: 2014-05-04 | Discharge: 2014-05-04 | Disposition: A | Discharge status: Home / Self Care | Payer: Medicaid Other | Source: Ambulatory Visit | Attending: Nurse Practitioner | Admitting: Nurse Practitioner

## 2014-05-04 DIAGNOSIS — C220 Liver cell carcinoma: Secondary | ICD-10-CM

## 2014-05-28 ENCOUNTER — Encounter: Payer: Self-pay | Admitting: Family Medicine

## 2014-05-28 ENCOUNTER — Ambulatory Visit: Payer: Medicaid Other | Admitting: Family Medicine

## 2014-05-28 ENCOUNTER — Ambulatory Visit (INDEPENDENT_AMBULATORY_CARE_PROVIDER_SITE_OTHER): Payer: Medicaid Other | Admitting: Family Medicine

## 2014-05-28 VITALS — BP 110/80 | HR 71 | Temp 97.5°F | Ht 67.0 in | Wt 175.0 lb

## 2014-05-28 DIAGNOSIS — Z23 Encounter for immunization: Secondary | ICD-10-CM

## 2014-05-28 DIAGNOSIS — I10 Essential (primary) hypertension: Secondary | ICD-10-CM

## 2014-05-28 DIAGNOSIS — J069 Acute upper respiratory infection, unspecified: Secondary | ICD-10-CM | POA: Insufficient documentation

## 2014-05-28 DIAGNOSIS — Z0000 Encounter for general adult medical examination without abnormal findings: Secondary | ICD-10-CM

## 2014-05-28 DIAGNOSIS — E785 Hyperlipidemia, unspecified: Secondary | ICD-10-CM

## 2014-05-28 DIAGNOSIS — B9789 Other viral agents as the cause of diseases classified elsewhere: Secondary | ICD-10-CM

## 2014-05-28 DIAGNOSIS — Z716 Tobacco abuse counseling: Secondary | ICD-10-CM

## 2014-05-28 LAB — POCT GLYCOSYLATED HEMOGLOBIN (HGB A1C): Hemoglobin A1C: 5.4

## 2014-05-28 LAB — BASIC METABOLIC PANEL
BUN: 16 mg/dL (ref 6–23)
CO2: 27 mEq/L (ref 19–32)
Calcium: 8.9 mg/dL (ref 8.4–10.5)
Chloride: 104 mEq/L (ref 96–112)
Creat: 1.63 mg/dL — ABNORMAL HIGH (ref 0.50–1.35)
Glucose, Bld: 97 mg/dL (ref 70–99)
Potassium: 3.9 mEq/L (ref 3.5–5.3)
Sodium: 139 mEq/L (ref 135–145)

## 2014-05-28 MED ORDER — ATORVASTATIN CALCIUM 40 MG PO TABS: 40.0000 mg | ORAL_TABLET | Freq: Every day | ORAL | Status: DC

## 2014-05-28 NOTE — Patient Instructions (Signed)
It was great to see you again!  For your cold, keep taking the Tylenol cough and cold as needed if this makes you feel better.  For your blood pressure, keep taking your current medications.    For your cholesterol, pick up the prescription for Atorvastatin 40mg  daily from your pharmacy.  Work on quitting smoking.   We are checking some labs today, and I will call you if they are abnormal. If you do not hear from me with a call or letter in 2 weeks, please call us as I may have been unable to reach you.   As you leave, make an appointment to follow up with me in 3-4 weeks to talk about quitting smoking.  - Bring all medications in a bag to your visits.  Lavon Paganini, MD ([facility]   Upper Respiratory Infection, Adult An upper respiratory infection (URI) is also known as the common cold. It is often caused by a type of germ (virus). Colds are easily spread (contagious). You can pass it to others by kissing, coughing, sneezing, or drinking out of the same glass. Usually, you get better in 1 or 2 weeks.  HOME CARE   Only take medicine as told by your doctor.  Use a warm mist humidifier or breathe in steam from a hot shower.  Drink enough water and fluids to keep your pee (urine) clear or pale yellow.  Get plenty of rest.  Return to work when your temperature is back to normal or as told by your doctor. You may use a face mask and wash your hands to stop your cold from spreading. GET HELP RIGHT AWAY IF:   After the first few days, you feel you are getting worse.  You have questions about your medicine.  You have chills, shortness of breath, or brown or red spit (mucus).  You have yellow or brown snot (nasal discharge) or pain in the face, especially when you bend forward.  You have a fever, puffy (swollen) neck, pain when you swallow, or white spots in the back of your throat.  You have a bad headache, ear pain, sinus pain, or chest pain.  You have a  high-pitched whistling sound when you breathe in and out (wheezing).  You have a lasting cough or cough up blood.  You have sore muscles or a stiff neck. MAKE SURE YOU:   Understand these instructions.  Will watch your condition.  Will get help right away if you are not doing well or get worse. Document Released: 01/20/2008 Document Revised: 10/26/2011 Document Reviewed: 11/08/2013 [facility] Patient Information 2015 [place], [place]. This information is not intended to replace advice given to you by your health care provider. Make sure you discuss any questions you have with your health care provider.

## 2014-05-28 NOTE — Assessment & Plan Note (Signed)
-   ASCVD 10 year risk calculated as 20.2% at last visit. - Start Lipitor 40 mg daily. - Check Hemoglobin A1c today.

## 2014-05-28 NOTE — Assessment & Plan Note (Signed)
Patient working to cut back from smoking 4-5 cigarettes per day. Counseled about the cardiovascular benefits of quitting smoking today. Patient is not interested in nicotine patch or any medications to help with tobacco cessation. He reports that he is going to quit between now and seeing me in 1 month. Followup with progress in one month.

## 2014-05-28 NOTE — Progress Notes (Signed)
   Subjective:   [name] is a 63 y.o. male with a history of HTN, HLD, tobacco abuse here for cough, f/u chronic issues.  1. Cough: Patient reports productive cough with clear sputum x2 weeks. It is associated with rhinorrhea and sneezing.  He initially had chills before starting taking Tylenol cough and cold. He denies fevers. He denies any sick contacts, shortness breath, wheezing. He has not gotten his flu shot yet this year.  2. HTN: Taking lisinopril HCTZ 20/25 daily. Denies any medication side effects. Denies any symptomatic hypotension. Denies any chest pain, shortness of breath.  3. HLD: Lipid panel done during last visit. Patient reports that he did not hear about this, and so he has not picked up his prescription for atorvastatin. He will start taking this after this visit.  4. tobacco abuse: He is working on quitting smoking. He is down to smoking 5-6 cigarettes per day.  Review of Systems:  Per HPI. All other systems reviewed and are negative.   PMH, PSH, Medications, Allergies, and FmHx reviewed and updated in EMR.  Social History: Current smoker  Objective:  BP 94/72  Pulse 71  Temp(Src) 97.5 F (36.4 C) (Oral)  Ht 5\' 7"  (1.702 m)  Wt 175 lb (79.379 kg)  BMI 27.40 kg/m2  Gen:  63 y.o. male in NAD HEENT: NCAT, MMM, EOMI, PERRL, anicteric sclerae, TMs clear, OP clear CV: RRR, no MRG, no JVD Resp: Non-labored, CTAB, no wheezes noted Abd: Soft, NTND, BS present Ext: WWP, no edema MSK: Full ROM, strength intact Neuro: Alert and oriented, speech normal      Chemistry      Component Value Date/Time   NA 139 11/21/2013 1608   K 4.1 11/21/2013 1608   CL 106 11/21/2013 1608   CO2 26 11/21/2013 1608   BUN 15 11/21/2013 1608   CREATININE 1.35 11/21/2013 1608   CREATININE 1.39* 09/09/2012 1634      Component Value Date/Time   CALCIUM 8.3* 11/21/2013 1608   ALKPHOS 102 11/21/2013 1608   AST 69* 11/21/2013 1608   ALT 44 11/21/2013 1608   BILITOT 2.0* 11/21/2013 1608      Lab  Results  Component Value Date   WBC 6.7 09/09/2012   HGB 14.2 09/09/2012   HCT 40.4 09/09/2012   MCV 81.8 09/09/2012   PLT 128* 09/09/2012   Lab Results  Component Value Date   TSH 0.887 03/26/2011   No results found for this basename: HGBA1C   Assessment:     [name] is a 63 y.o. male here for URI, HTN, HLD, tobacco abuse.    Plan:     See problem list for problem-specific plans.   Lavon Paganini, MD PGY-1,  [facility] 05/28/2014  2:34 PM

## 2014-05-28 NOTE — Assessment & Plan Note (Signed)
No features concerning for bacterial infection. - Continue symptomatic treatment with Tylenol cough and cold. - Informed patient that cough with viral URI can linger for 1-2 months.

## 2014-05-28 NOTE — Assessment & Plan Note (Signed)
Flu shot given today

## 2014-05-28 NOTE — Assessment & Plan Note (Signed)
Initial BP low, on repeat 110/80. No symptoms of hypotension reported. Compliant with medications. - Continue lisinopril-HCTZ 20-25 mg daily. Consider decreasing dosage next visit if BP remains low. - Check hemoglobin A1c and BMET today.

## 2014-05-30 ENCOUNTER — Telehealth: Payer: Self-pay | Admitting: Family Medicine

## 2014-05-30 NOTE — Telephone Encounter (Signed)
Called patient to discuss lab results.  No diabetes, per A1c.  Cr elevated from baseline of 1.1-1.35 to 1.63.  Could be related to ongoing viral URI with possible dehydration.  Advised patient to drink lots of fluids.  Warning signs reviewed.  Unlikely this is related to medications (on lisinopril-HCTZ, but this is a long-term medication).  Will recheck creatinine at next visit.   Lavon Paganini, MD, MPH PGY-1,  [facility] 05/30/2014 9:39 AM

## 2014-06-19 ENCOUNTER — Other Ambulatory Visit: Payer: Self-pay | Admitting: Family Medicine

## 2014-06-19 DIAGNOSIS — L299 Pruritus, unspecified: Secondary | ICD-10-CM

## 2014-06-19 MED ORDER — HYDROXYZINE HCL 25 MG PO TABS: 25.0000 mg | ORAL_TABLET | Freq: Three times a day (TID) | ORAL | Status: DC | PRN

## 2014-06-19 NOTE — Telephone Encounter (Signed)
Refill request for hydrozyxine. Pt states that when he was here on 10/12 this medication was supposed to be refilled. Has been out of meds for a 1 week now. Pls advise.

## 2014-06-19 NOTE — Telephone Encounter (Signed)
We did not talk about Hydroxyzine during recent visit.  Refill sent to pharmacy.  Will discuss with patient at upcoming appt.   Lavon Paganini, MD, MPH PGY-1,  [facility] 06/19/2014 4:56 PM

## 2014-06-20 NOTE — Telephone Encounter (Signed)
Pt informed. [place], [place] CMA 

## 2014-06-28 ENCOUNTER — Ambulatory Visit (INDEPENDENT_AMBULATORY_CARE_PROVIDER_SITE_OTHER): Payer: Medicaid Other | Admitting: Family Medicine

## 2014-06-28 ENCOUNTER — Encounter: Payer: Self-pay | Admitting: Family Medicine

## 2014-06-28 VITALS — BP 115/74 | HR 80 | Temp 97.7°F | Ht 67.0 in | Wt 176.0 lb

## 2014-06-28 DIAGNOSIS — I10 Essential (primary) hypertension: Secondary | ICD-10-CM

## 2014-06-28 MED ORDER — ADULT MULTIVITAMIN W/MINERALS CH: 1.0000 | ORAL_TABLET | Freq: Every day | ORAL | Status: DC

## 2014-06-28 NOTE — Assessment & Plan Note (Signed)
Currently normotensive, but history of mild hypertension on medications. Patient reporting symptomatic hypotension. Dry cough could be related to lisinopril use. - Hold lisinopril HCTZ. - if cough resolves off lisinopril, will avoid ACE inhibitor's in the future. - RN visit for BP check in 2 weeks. If blood pressure elevated at that time consider low-dose losartan.

## 2014-06-28 NOTE — Patient Instructions (Signed)
It was nice to see you again.  Stop taking your blood pressure pill, lisinopril-HCTZ. When you check out, make an appointment with a nurse for blood pressure check in about 2 weeks.  Come back and see me in 2-3 months or sooner if you have concerns.  Dr. Brita Romp (Dr. B)   Hypertension Hypertension, commonly called high blood pressure, is when the force of blood pumping through your arteries is too strong. Your arteries are the blood vessels that carry blood from your heart throughout your body. A blood pressure reading consists of a higher number over a lower number, such as 110/72. The higher number (systolic) is the pressure inside your arteries when your heart pumps. The lower number (diastolic) is the pressure inside your arteries when your heart relaxes. Ideally you want your blood pressure below 120/80. Hypertension forces your heart to work harder to pump blood. Your arteries may become narrow or stiff. Having hypertension puts you at risk for heart disease, stroke, and other problems.  RISK FACTORS Some risk factors for high blood pressure are controllable. Others are not.  Risk factors you cannot control include:   Race. You may be at higher risk if you are African American.  Age. Risk increases with age.  Gender. Men are at higher risk than women before age 85 years. After age 17, women are at higher risk than men. Risk factors you can control include:  Not getting enough exercise or physical activity.  Being overweight.  Getting too much fat, sugar, calories, or salt in your diet.  Drinking too much alcohol. SIGNS AND SYMPTOMS Hypertension does not usually cause signs or symptoms. Extremely high blood pressure (hypertensive crisis) may cause headache, anxiety, shortness of breath, and nosebleed. DIAGNOSIS  To check if you have hypertension, your health care provider will measure your blood pressure while you are seated, with your arm held at the level of your heart. It  should be measured at least twice using the same arm. Certain conditions can cause a difference in blood pressure between your right and left arms. A blood pressure reading that is higher than normal on one occasion does not mean that you need treatment. If one blood pressure reading is high, ask your health care provider about having it checked again. TREATMENT  Treating high blood pressure includes making lifestyle changes and possibly taking medicine. Living a healthy lifestyle can help lower high blood pressure. You may need to change some of your habits. Lifestyle changes may include:  Following the DASH diet. This diet is high in fruits, vegetables, and whole grains. It is low in salt, red meat, and added sugars.  Getting at least 2 hours of brisk physical activity every week.  Losing weight if necessary.  Not smoking.  Limiting alcoholic beverages.  Learning ways to reduce stress. If lifestyle changes are not enough to get your blood pressure under control, your health care provider may prescribe medicine. You may need to take more than one. Work closely with your health care provider to understand the risks and benefits. HOME CARE INSTRUCTIONS  Have your blood pressure rechecked as directed by your health care provider.   Take medicines only as directed by your health care provider. Follow the directions carefully. Blood pressure medicines must be taken as prescribed. The medicine does not work as well when you skip doses. Skipping doses also puts you at risk for problems.   Do not smoke.   Monitor your blood pressure at home as directed by  your health care provider. SEEK MEDICAL CARE IF:   You think you are having a reaction to medicines taken.  You have recurrent headaches or feel dizzy.  You have swelling in your ankles.  You have trouble with your vision. SEEK IMMEDIATE MEDICAL CARE IF:  You develop a severe headache or confusion.  You have unusual weakness,  numbness, or feel faint.  You have severe chest or abdominal pain.  You vomit repeatedly.  You have trouble breathing. MAKE SURE YOU:   Understand these instructions.  Will watch your condition.  Will get help right away if you are not doing well or get worse. Document Released: 08/03/2005 Document Revised: 12/18/2013 Document Reviewed: 05/26/2013 [facility] Patient Information 2015 [place], [place]. This information is not intended to replace advice given to you by your health care provider. Make sure you discuss any questions you have with your health care provider.

## 2014-06-28 NOTE — Progress Notes (Signed)
   Subjective:   [name] is a 63 y.o. male with a history of hypertension here for hypertension follow-up, cough.  -HTN: Patient reports taking medications every day. He reports that he is occasionally getting lightheaded especially when standing from a sitting position. He is in trying to jog more frequently lately.  -Cough: Patient seen about a month ago for viral URI that has resolved but lingering cough thereafter. Viral URI with now 2 months ago and all other symptoms of a gone for about 6 weeks. He is still experiencing dry cough.  Review of Systems:  Per HPI. All other systems reviewed and are negative.   PMH, PSH, Medications, Allergies, and FmHx reviewed and updated in EMR.  Social History: current smoker - trying to cut back  Objective:  BP 115/74 mmHg  Pulse 80  Temp(Src) 97.7 F (36.5 C) (Oral)  Ht 5\' 7"  (1.702 m)  Wt 176 lb (79.833 kg)  BMI 27.56 kg/m2  Gen:  63 y.o. male in NAD HEENT: NCAT, MMM, EOMI, PERRL, anicteric sclerae CV: RRR, no MRG, no JVD Resp: Non-labored, CTAB, no wheezes noted Ext: WWP, no edema Neuro: Alert and oriented, speech normal      Chemistry      Component Value Date/Time   NA 139 05/28/2014 1446   K 3.9 05/28/2014 1446   CL 104 05/28/2014 1446   CO2 27 05/28/2014 1446   BUN 16 05/28/2014 1446   CREATININE 1.63* 05/28/2014 1446   CREATININE 1.39* 09/09/2012 1634      Component Value Date/Time   CALCIUM 8.9 05/28/2014 1446   ALKPHOS 102 11/21/2013 1608   AST 69* 11/21/2013 1608   ALT 44 11/21/2013 1608   BILITOT 2.0* 11/21/2013 1608      Lab Results  Component Value Date   WBC 6.7 09/09/2012   HGB 14.2 09/09/2012   HCT 40.4 09/09/2012   MCV 81.8 09/09/2012   PLT 128* 09/09/2012   Lab Results  Component Value Date   TSH 0.887 03/26/2011   Lab Results  Component Value Date   HGBA1C 5.4 05/28/2014   Assessment:     [name] is a 63 y.o. male here for HTN follow-up, cough.    Plan:     See problem  list for problem-specific plans.   Lavon Paganini, MD PGY-1,  [facility] 06/28/2014  3:05 PM

## 2014-07-27 ENCOUNTER — Ambulatory Visit (INDEPENDENT_AMBULATORY_CARE_PROVIDER_SITE_OTHER): Payer: Medicaid Other | Admitting: Family Medicine

## 2014-07-27 ENCOUNTER — Encounter: Payer: Self-pay | Admitting: Family Medicine

## 2014-07-27 VITALS — BP 134/76 | HR 58 | Temp 97.8°F | Ht 67.0 in | Wt 172.0 lb

## 2014-07-27 DIAGNOSIS — R339 Retention of urine, unspecified: Secondary | ICD-10-CM

## 2014-07-27 DIAGNOSIS — N400 Enlarged prostate without lower urinary tract symptoms: Secondary | ICD-10-CM

## 2014-07-27 LAB — POCT UA - MICROSCOPIC ONLY

## 2014-07-27 LAB — POCT URINALYSIS DIPSTICK
Bilirubin, UA: NEGATIVE
Glucose, UA: NEGATIVE
Ketones, UA: NEGATIVE
Nitrite, UA: POSITIVE
Protein, UA: NEGATIVE
Spec Grav, UA: 1.015
Urobilinogen, UA: 0.2
pH, UA: 6

## 2014-07-27 MED ORDER — FOSFOMYCIN TROMETHAMINE 3 G PO PACK: PACK | ORAL | Status: DC

## 2014-07-27 MED ORDER — TAMSULOSIN HCL 0.4 MG PO CAPS: 0.8000 mg | ORAL_CAPSULE | Freq: Every day | ORAL | Status: DC

## 2014-07-27 NOTE — Assessment & Plan Note (Addendum)
Refilled flomax Complaining of difficulty urinating but still urinating several times daily Last PSA 2 years ago 0.29 F/u 1 week to ensure resolution of symptoms, cautioned to seek emergency help if unable to urinate for a prolonged period despite having the urge to urinate    Update UA shows + nitrites and Leu, loaded WBC and 3+ Cocci I sent this for culture Given this finding I will treat him for a complicated UTO to cover most G+ cocci with Fosfomycin X 3 (every-other day)  I called and did not get him but his brother will inform him there is an extra prescription at the pharmacy and to talk to the pharmacist.

## 2014-07-27 NOTE — Patient Instructions (Signed)
Great to meet you today!  Please come back next week for follow up  Please seek immediate medical help if you are unable to urinate when you need to for more than 12 hours.

## 2014-07-27 NOTE — Addendum Note (Signed)
Addended by: [place], Oluchi Pucci on: 07/27/2014 05:42 PM   Modules accepted: Orders

## 2014-07-27 NOTE — Progress Notes (Signed)
Patient ID: [name], male   DOB: [birth year], 63 y.o.   MRN: [id number]   HPI  Patient presents today for difficulty urinating  Patient states that he's been having difficulty urinating since he ran out of Flomax about 4 weeks ago. He states that he still urinates several times a day but smaller quantities than usual and he has to bear down hard to urinate.  He denies fever, chills, dysuria, abdominal pain, back pain, foul-smelling urine  He states that he seen a urologist several years ago.  Smoking status noted ROS: Per HPI  Objective: BP 134/76 mmHg  Pulse 58  Temp(Src) 97.8 F (36.6 C) (Oral)  Ht 5\' 7"  (1.702 m)  Wt 172 lb (78.019 kg)  BMI 26.93 kg/m2 Gen: NAD, alert, cooperative with exam HEENT: NCAT CV: RRR, good S1/S2, no murmur Resp: CTABL, no wheezes, non-labored Abd: SNTND, BS present, no guarding or organomegaly Ext: No edema, warm Neuro: Alert and oriented, No gross deficits Rectal/prostate: Normal rectal tone, attempted to palpate prostate however was unsuccessful, no prostate enlargement or nodules felt, however had difficulty reaching it  Assessment and plan:  BPH (benign prostatic hyperplasia) Refilled flomax Complaining of difficulty urinating but still urinating several times daily Last PSA 2 years ago 0.29 F/u 1 week to ensure resolution of symptoms, cautioned to seek emergency help if unable to urinate for a prolonged period despite having the urge to urinate     Orders Placed This Encounter  Procedures  . Urine culture  . POCT urinalysis dipstick    Meds ordered this encounter  Medications  . tamsulosin (FLOMAX) 0.4 MG CAPS capsule    Sig: Take 2 capsules (0.8 mg total) by mouth daily.    Dispense:  60 capsule    Refill:  11

## 2014-07-27 NOTE — Addendum Note (Signed)
Addended by: Timmothy Euler on: 07/27/2014 05:42 PM   Modules accepted: Orders

## 2014-07-30 LAB — URINE CULTURE: Colony Count: 95000

## 2014-08-13 ENCOUNTER — Ambulatory Visit (INDEPENDENT_AMBULATORY_CARE_PROVIDER_SITE_OTHER): Payer: Medicaid Other | Admitting: Family Medicine

## 2014-08-13 ENCOUNTER — Encounter: Payer: Self-pay | Admitting: Family Medicine

## 2014-08-13 VITALS — BP 128/78 | HR 64 | Temp 98.7°F | Ht 67.0 in | Wt 175.0 lb

## 2014-08-13 DIAGNOSIS — Z0000 Encounter for general adult medical examination without abnormal findings: Secondary | ICD-10-CM

## 2014-08-13 DIAGNOSIS — N400 Enlarged prostate without lower urinary tract symptoms: Secondary | ICD-10-CM

## 2014-08-13 DIAGNOSIS — I10 Essential (primary) hypertension: Secondary | ICD-10-CM

## 2014-08-13 NOTE — Progress Notes (Signed)
   Subjective:   [name] is a 63 y.o. male with a history of hypertension, hyperlipidemia, BPH here for follow-up.  Hypertension: Patient last seen 06/28/14 for hypertension follow-up. Noted ongoing symptomatic hypotension at home. We stopped lisinopril/HCTZ. He reports feeling better with no symptoms of hypotension since stopping the medication. His dry cough has also stopped since stopping lisinopril.  He denies any chest pain, shortness of breath, or LE edema.  BPH: Patient recently seen on 07/27/14 for difficulty urinating since running out of Flomax. He was restarted on Flomax and he denies any difficulty with urination now.  Review of Systems:  Per HPI. All other systems reviewed and are negative.   PMH, PSH, Medications, Allergies, and FmHx reviewed and updated in EMR.  Social History: current smoker  Objective:  BP 128/78 mmHg  Pulse 64  Temp(Src) 98.7 F (37.1 C) (Oral)  Ht 5\' 7"  (1.702 m)  Wt 175 lb (79.379 kg)  BMI 27.40 kg/m2  Gen:  63 y.o. male in NAD HEENT: NCAT, MMM, EOMI, PERRL, anicteric sclerae CV: RRR, no MRG, no JVD Resp: Non-labored, CTAB, no wheezes noted Ext: WWP, no edema Neuro: Alert and oriented, speech normal      Chemistry      Component Value Date/Time   NA 139 05/28/2014 1446   K 3.9 05/28/2014 1446   CL 104 05/28/2014 1446   CO2 27 05/28/2014 1446   BUN 16 05/28/2014 1446   CREATININE 1.63* 05/28/2014 1446   CREATININE 1.39* 09/09/2012 1634      Component Value Date/Time   CALCIUM 8.9 05/28/2014 1446   ALKPHOS 102 11/21/2013 1608   AST 69* 11/21/2013 1608   ALT 44 11/21/2013 1608   BILITOT 2.0* 11/21/2013 1608      Lab Results  Component Value Date   WBC 6.7 09/09/2012   HGB 14.2 09/09/2012   HCT 40.4 09/09/2012   MCV 81.8 09/09/2012   PLT 128* 09/09/2012   Lab Results  Component Value Date   TSH 0.887 03/26/2011   Lab Results  Component Value Date   HGBA1C 5.4 05/28/2014   Assessment:     [name] is a 63  y.o. male here for HTN, BPH f/u.    Plan:     See problem list for problem-specific plans.   Lavon Paganini, MD PGY-1,  [facility] 08/13/2014  1:39 PM

## 2014-08-13 NOTE — Assessment & Plan Note (Signed)
Currently up to date. Scheduled for colonoscopy later next month.

## 2014-08-13 NOTE — Patient Instructions (Signed)
It was nice to see you again today. Your blood pressure is good. You can continue to not take medicine for your blood pressure. I will see you again in 3 months.  Take Care, Dr. Jacinto Reap  DASH Eating Plan DASH stands for "Dietary Approaches to Stop Hypertension." The DASH eating plan is a healthy eating plan that has been shown to reduce high blood pressure (hypertension). Additional health benefits may include reducing the risk of type 2 diabetes mellitus, heart disease, and stroke. The DASH eating plan may also help with weight loss. WHAT DO I NEED TO KNOW ABOUT THE DASH EATING PLAN? For the DASH eating plan, you will follow these general guidelines:  Choose foods with a percent daily value for sodium of less than 5% (as listed on the food label).  Use salt-free seasonings or herbs instead of table salt or sea salt.  Check with your health care provider or pharmacist before using salt substitutes.  Eat lower-sodium products, often labeled as "lower sodium" or "no salt added."  Eat fresh foods.  Eat more vegetables, fruits, and low-fat dairy products.  Choose whole grains. Look for the word "whole" as the first word in the ingredient list.  Choose fish and skinless chicken or [place] more often than red meat. Limit fish, poultry, and meat to 6 oz (170 g) each day.  Limit sweets, desserts, sugars, and sugary drinks.  Choose heart-healthy fats.  Limit cheese to 1 oz (28 g) per day.  Eat more home-cooked food and less restaurant, buffet, and fast food.  Limit fried foods.  Cook foods using methods other than frying.  Limit canned vegetables. If you do use them, rinse them well to decrease the sodium.  When eating at a restaurant, ask that your food be prepared with less salt, or no salt if possible. WHAT FOODS CAN I EAT? Seek help from a dietitian for individual calorie needs. Grains Whole grain or whole wheat bread. Brown rice. Whole grain or whole wheat pasta. Quinoa, bulgur, and  whole grain cereals. Low-sodium cereals. Corn or whole wheat flour tortillas. Whole grain cornbread. Whole grain crackers. Low-sodium crackers. Vegetables Fresh or frozen vegetables (raw, steamed, roasted, or grilled). Low-sodium or reduced-sodium tomato and vegetable juices. Low-sodium or reduced-sodium tomato sauce and paste. Low-sodium or reduced-sodium canned vegetables.  Fruits All fresh, canned (in natural juice), or frozen fruits. Meat and Other Protein Products Ground beef (85% or leaner), grass-fed beef, or beef trimmed of fat. Skinless chicken or [place]. Ground chicken or [place]. Pork trimmed of fat. All fish and seafood. Eggs. Dried beans, peas, or lentils. Unsalted nuts and seeds. Unsalted canned beans. Dairy Low-fat dairy products, such as skim or 1% milk, 2% or reduced-fat cheeses, low-fat ricotta or cottage cheese, or plain low-fat yogurt. Low-sodium or reduced-sodium cheeses. Fats and Oils Tub margarines without trans fats. Light or reduced-fat mayonnaise and salad dressings (reduced sodium). Avocado. Safflower, olive, or canola oils. Natural peanut or almond butter. Other Unsalted popcorn and pretzels. The items listed above may not be a complete list of recommended foods or beverages. Contact your dietitian for more options. WHAT FOODS ARE NOT RECOMMENDED? Grains White bread. White pasta. White rice. Refined cornbread. Bagels and croissants. Crackers that contain trans fat. Vegetables Creamed or fried vegetables. Vegetables in a cheese sauce. Regular canned vegetables. Regular canned tomato sauce and paste. Regular tomato and vegetable juices. Fruits Dried fruits. Canned fruit in light or heavy syrup. Fruit juice. Meat and Other Protein Products Fatty cuts of meat. Ribs,  chicken wings, bacon, sausage, bologna, salami, chitterlings, fatback, hot dogs, bratwurst, and packaged luncheon meats. Salted nuts and seeds. Canned beans with salt. Dairy Whole or 2% milk, cream,  half-and-half, and cream cheese. Whole-fat or sweetened yogurt. Full-fat cheeses or blue cheese. Nondairy creamers and whipped toppings. Processed cheese, cheese spreads, or cheese curds. Condiments Onion and garlic salt, seasoned salt, table salt, and sea salt. Canned and packaged gravies. Worcestershire sauce. Tartar sauce. Barbecue sauce. Teriyaki sauce. Soy sauce, including reduced sodium. Steak sauce. Fish sauce. Oyster sauce. Cocktail sauce. Horseradish. Ketchup and mustard. Meat flavorings and tenderizers. Bouillon cubes. Hot sauce. Tabasco sauce. Marinades. Taco seasonings. Relishes. Fats and Oils Butter, stick margarine, lard, shortening, ghee, and bacon fat. Coconut, palm kernel, or palm oils. Regular salad dressings. Other Pickles and olives. Salted popcorn and pretzels. The items listed above may not be a complete list of foods and beverages to avoid. Contact your dietitian for more information. WHERE CAN I FIND MORE INFORMATION? National Heart, Lung, and Blood Institute: travelstabloid.com Document Released: 07/23/2011 Document Revised: 12/18/2013 Document Reviewed: 06/07/2013 [facility] Patient Information 2015 [place], [place]. This information is not intended to replace advice given to you by your health care provider. Make sure you discuss any questions you have with your health care provider.

## 2014-08-13 NOTE — Assessment & Plan Note (Addendum)
Currently normotensive off of all antihypertensive medications.   Dry cough resolved - advised patient not to take ace inhibitors in the future Symptomatically hypotension resolved Continue to hold antihypertensives BMET today to f/u Creatinine (elevated to 1.63 on Lisinopril/HCTZ)

## 2014-08-13 NOTE — Assessment & Plan Note (Signed)
Symptoms resolved. Continue Flomax

## 2014-08-14 ENCOUNTER — Encounter: Payer: Self-pay | Admitting: Family Medicine

## 2014-08-14 LAB — BASIC METABOLIC PANEL
BUN: 15 mg/dL (ref 6–23)
CO2: 21 mEq/L (ref 19–32)
Calcium: 9 mg/dL (ref 8.4–10.5)
Chloride: 103 mEq/L (ref 96–112)
Creat: 1.4 mg/dL — ABNORMAL HIGH (ref 0.50–1.35)
Glucose, Bld: 71 mg/dL (ref 70–99)
Potassium: 4.2 mEq/L (ref 3.5–5.3)
Sodium: 141 mEq/L (ref 135–145)

## 2014-08-27 ENCOUNTER — Other Ambulatory Visit: Payer: Self-pay | Admitting: Nurse Practitioner

## 2014-08-27 DIAGNOSIS — C220 Liver cell carcinoma: Secondary | ICD-10-CM

## 2014-08-31 ENCOUNTER — Ambulatory Visit
Admission: RE | Admit: 2014-08-31 | Discharge: 2014-08-31 | Disposition: A | Discharge status: Home / Self Care | Payer: Medicaid Other | Source: Ambulatory Visit | Attending: Nurse Practitioner | Admitting: Nurse Practitioner

## 2014-08-31 ENCOUNTER — Ambulatory Visit (INDEPENDENT_AMBULATORY_CARE_PROVIDER_SITE_OTHER): Payer: Medicaid Other | Admitting: Family Medicine

## 2014-08-31 DIAGNOSIS — C220 Liver cell carcinoma: Secondary | ICD-10-CM

## 2014-08-31 DIAGNOSIS — Z0000 Encounter for general adult medical examination without abnormal findings: Secondary | ICD-10-CM

## 2014-08-31 NOTE — Progress Notes (Signed)
Opened in error. No show.

## 2014-09-14 ENCOUNTER — Ambulatory Visit: Payer: Medicaid Other | Admitting: Family Medicine

## 2014-09-24 ENCOUNTER — Encounter: Payer: Self-pay | Admitting: Family Medicine

## 2014-09-24 ENCOUNTER — Ambulatory Visit (INDEPENDENT_AMBULATORY_CARE_PROVIDER_SITE_OTHER): Payer: Medicaid Other | Admitting: Family Medicine

## 2014-09-24 VITALS — BP 161/94 | HR 66 | Temp 98.2°F | Ht 67.0 in | Wt 178.0 lb

## 2014-09-24 DIAGNOSIS — Z716 Tobacco abuse counseling: Secondary | ICD-10-CM

## 2014-09-24 DIAGNOSIS — I10 Essential (primary) hypertension: Secondary | ICD-10-CM

## 2014-09-24 MED ORDER — LOSARTAN POTASSIUM 50 MG PO TABS: 50.0000 mg | ORAL_TABLET | Freq: Every day | ORAL | Status: DC

## 2014-09-24 MED ORDER — ASPIRIN 81 MG PO TABS: 81.0000 mg | ORAL_TABLET | Freq: Every day | ORAL | Status: DC

## 2014-09-24 NOTE — Progress Notes (Signed)
   Subjective:   KIREE DEJARNETTE is a 64 y.o. male with a history of HTN here for BP and lab f/u and tobacco cessation counseling.  Stopped all antihypertensive in 06/2014 after patient reporting symptomatic hypotension.  Now hypertensive.  Denies any CP, SOB, HAs, changes in vision  Exercising about the same amount.  Walking daily and soon increasing to jogging.  Review of Systems:  Per HPI. All other systems reviewed and are negative.   PMH, PSH, Medications, Allergies, and FmHx reviewed and updated in EMR.  Social History: current smoker - cutting back - currently at 3 cigarettes per day  Objective:  BP 161/94 mmHg  Pulse 66  Temp(Src) 98.2 F (36.8 C) (Oral)  Ht 5\' 7"  (1.702 m)  Wt 178 lb (80.74 kg)  BMI 27.87 kg/m2  Gen:  64 y.o. male in NAD HEENT: NCAT, MMM, EOMI, PERRL, anicteric sclerae CV: RRR, no MRG, no JVD Resp: Non-labored, CTAB, no wheezes noted Ext: WWP, no edema Neuro: Alert and oriented, speech normal      Chemistry      Component Value Date/Time   NA 141 08/13/2014 1401   K 4.2 08/13/2014 1401   CL 103 08/13/2014 1401   CO2 21 08/13/2014 1401   BUN 15 08/13/2014 1401   CREATININE 1.40* 08/13/2014 1401   CREATININE 1.39* 09/09/2012 1634      Component Value Date/Time   CALCIUM 9.0 08/13/2014 1401   ALKPHOS 102 11/21/2013 1608   AST 69* 11/21/2013 1608   ALT 44 11/21/2013 1608   BILITOT 2.0* 11/21/2013 1608      Lab Results  Component Value Date   WBC 6.7 09/09/2012   HGB 14.2 09/09/2012   HCT 40.4 09/09/2012   MCV 81.8 09/09/2012   PLT 128* 09/09/2012   Lab Results  Component Value Date   TSH 0.887 03/26/2011   Lab Results  Component Value Date   HGBA1C 5.4 05/28/2014   Assessment:     SOMA LIZAK is a 64 y.o. male here for BP f/u and tobacco cessation.    Plan:     See problem list for problem-specific plans.   Lavon Paganini, MD PGY-1,  [facility] 09/24/2014  2:46 PM

## 2014-09-24 NOTE — Assessment & Plan Note (Signed)
Currently hypertensive at 161/94 Previously had dry cough while taking lisinopril Will start losartan 50 mg daily Follow-up in one month for blood pressure control. May need to increase losartan or restart HCTZ at that point.

## 2014-09-24 NOTE — Progress Notes (Signed)
I agree with the resident note.  Dossie Arbour MD

## 2014-09-24 NOTE — Assessment & Plan Note (Signed)
Patient working to cut back. Now smoking 3 cigarettes per day Counseled about the cardiovascular benefits of quitting smoking Patient not interested in any medications to help with tobacco cessation Advised patient to call [place] quit line for counseling and nicotine patches if desired Follow-up with progress in one month

## 2014-09-24 NOTE — Patient Instructions (Signed)
It was nice to see you again today. Start taking losartan once daily for your blood pressure. I like to see you back in one month to see how this is doing for your blood pressure. We may need to add another medication at that time.  Keep working on quitting smoking. You can try to call the [place] quit line for further assistance. Their number is 1-800-quit-now.  Take Care Dr B  Hypertension Hypertension, commonly called high blood pressure, is when the force of blood pumping through your arteries is too strong. Your arteries are the blood vessels that carry blood from your heart throughout your body. A blood pressure reading consists of a higher number over a lower number, such as 110/72. The higher number (systolic) is the pressure inside your arteries when your heart pumps. The lower number (diastolic) is the pressure inside your arteries when your heart relaxes. Ideally you want your blood pressure below 120/80. Hypertension forces your heart to work harder to pump blood. Your arteries may become narrow or stiff. Having hypertension puts you at risk for heart disease, stroke, and other problems.  RISK FACTORS Some risk factors for high blood pressure are controllable. Others are not.  Risk factors you cannot control include:   Race. You may be at higher risk if you are African American.  Age. Risk increases with age.  Gender. Men are at higher risk than women before age 72 years. After age 77, women are at higher risk than men. Risk factors you can control include:  Not getting enough exercise or physical activity.  Being overweight.  Getting too much fat, sugar, calories, or salt in your diet.  Drinking too much alcohol. SIGNS AND SYMPTOMS Hypertension does not usually cause signs or symptoms. Extremely high blood pressure (hypertensive crisis) may cause headache, anxiety, shortness of breath, and nosebleed. DIAGNOSIS  To check if you have hypertension, your health care provider will  measure your blood pressure while you are seated, with your arm held at the level of your heart. It should be measured at least twice using the same arm. Certain conditions can cause a difference in blood pressure between your right and left arms. A blood pressure reading that is higher than normal on one occasion does not mean that you need treatment. If one blood pressure reading is high, ask your health care provider about having it checked again. TREATMENT  Treating high blood pressure includes making lifestyle changes and possibly taking medicine. Living a healthy lifestyle can help lower high blood pressure. You may need to change some of your habits. Lifestyle changes may include:  Following the DASH diet. This diet is high in fruits, vegetables, and whole grains. It is low in salt, red meat, and added sugars.  Getting at least 2 hours of brisk physical activity every week.  Losing weight if necessary.  Not smoking.  Limiting alcoholic beverages.  Learning ways to reduce stress. If lifestyle changes are not enough to get your blood pressure under control, your health care provider may prescribe medicine. You may need to take more than one. Work closely with your health care provider to understand the risks and benefits. HOME CARE INSTRUCTIONS  Have your blood pressure rechecked as directed by your health care provider.   Take medicines only as directed by your health care provider. Follow the directions carefully. Blood pressure medicines must be taken as prescribed. The medicine does not work as well when you skip doses. Skipping doses also puts you at  risk for problems.   Do not smoke.   Monitor your blood pressure at home as directed by your health care provider. SEEK MEDICAL CARE IF:   You think you are having a reaction to medicines taken.  You have recurrent headaches or feel dizzy.  You have swelling in your ankles.  You have trouble with your vision. SEEK  IMMEDIATE MEDICAL CARE IF:  You develop a severe headache or confusion.  You have unusual weakness, numbness, or feel faint.  You have severe chest or abdominal pain.  You vomit repeatedly.  You have trouble breathing. MAKE SURE YOU:   Understand these instructions.  Will watch your condition.  Will get help right away if you are not doing well or get worse. Document Released: 08/03/2005 Document Revised: 12/18/2013 Document Reviewed: 05/26/2013 [facility] Patient Information 2015 [place], [place]. This information is not intended to replace advice given to you by your health care provider. Make sure you discuss any questions you have with your health care provider.

## 2014-10-23 ENCOUNTER — Ambulatory Visit (INDEPENDENT_AMBULATORY_CARE_PROVIDER_SITE_OTHER): Payer: Medicaid Other | Admitting: Family Medicine

## 2014-10-23 ENCOUNTER — Encounter: Payer: Self-pay | Admitting: Family Medicine

## 2014-10-23 VITALS — BP 142/100 | HR 68 | Temp 98.1°F | Ht 67.0 in | Wt 179.0 lb

## 2014-10-23 DIAGNOSIS — Z716 Tobacco abuse counseling: Secondary | ICD-10-CM

## 2014-10-23 DIAGNOSIS — E785 Hyperlipidemia, unspecified: Secondary | ICD-10-CM

## 2014-10-23 DIAGNOSIS — Z0000 Encounter for general adult medical examination without abnormal findings: Secondary | ICD-10-CM

## 2014-10-23 DIAGNOSIS — I10 Essential (primary) hypertension: Secondary | ICD-10-CM

## 2014-10-23 MED ORDER — ATORVASTATIN CALCIUM 40 MG PO TABS: 40.0000 mg | ORAL_TABLET | Freq: Every day | ORAL | Status: DC

## 2014-10-23 NOTE — Assessment & Plan Note (Addendum)
Currently uncontrolled but forgot see medication today Reports BP at goal with home readings Continue Losartan 50mg  daily F/u for RN visit for BP check in 1-2 weeks Will adjust medication pending BP check

## 2014-10-23 NOTE — Progress Notes (Signed)
   Subjective:   [name] is a 64 y.o. male with a history of HTN, HLD, tobacco abuse here for HTN f/u.  HTN: Rushed this morning and forgot to take medication Taking losartan 50mg  daily No hypotension, CP, SOB, HAs No medication SEs Checks BP at home intermittently - 118/80 Still walking daily, plans to start jogging Monday  Review of Systems:  Per HPI. All other systems reviewed and are negative.   PMH, PSH, Medications, Allergies, and FmHx reviewed and updated in EMR.  Social History: current smoker - cutting back, smoking 3-4 cigarettes daily, working on quitting, not interested in meds to help quit  Objective:  BP 142/100 mmHg  Pulse 68  Temp(Src) 98.1 F (36.7 C) (Oral)  Ht 5\' 7"  (1.702 m)  Wt 179 lb (81.194 kg)  BMI 28.03 kg/m2  Gen:  64 y.o. male in NAD HEENT: NCAT, MMM, EOMI, PERRL, anicteric sclerae CV: RRR, no MRG Resp: Non-labored, CTAB, no wheezes noted Ext: WWP, no edema Neuro: Alert and oriented, speech normal      Chemistry      Component Value Date/Time   NA 141 08/13/2014 1401   K 4.2 08/13/2014 1401   CL 103 08/13/2014 1401   CO2 21 08/13/2014 1401   BUN 15 08/13/2014 1401   CREATININE 1.40* 08/13/2014 1401   CREATININE 1.39* 09/09/2012 1634      Component Value Date/Time   CALCIUM 9.0 08/13/2014 1401   ALKPHOS 102 11/21/2013 1608   AST 69* 11/21/2013 1608   ALT 44 11/21/2013 1608   BILITOT 2.0* 11/21/2013 1608      Lab Results  Component Value Date   WBC 6.7 09/09/2012   HGB 14.2 09/09/2012   HCT 40.4 09/09/2012   MCV 81.8 09/09/2012   PLT 128* 09/09/2012   Lab Results  Component Value Date   TSH 0.887 03/26/2011   Lab Results  Component Value Date   HGBA1C 5.4 05/28/2014   Assessment:     [name] is a 64 y.o. male here for HTN f/u.    Plan:     See problem list for problem-specific plans.   [name], MD PGY-1,  [facility] 10/23/2014  10:45 AM

## 2014-10-23 NOTE — Assessment & Plan Note (Signed)
Tobacco cessation counseling given today Patient working on cutting back a number of cigarettes he smokes daily Patient not interested in medications to help him quit We'll continue to address

## 2014-10-23 NOTE — Patient Instructions (Addendum)
Is nice to see you again today. Continue to take your losartan daily for blood pressure. Please schedule an appointment for nurse visit in 2 weeks for blood pressure check. I'll then call you to address your medicine depending on her blood pressure that day.  Take care, Dr. B  Smoking Cessation Quitting smoking is important to your health and has many advantages. However, it is not always easy to quit since nicotine is a very addictive drug. Oftentimes, people try 3 times or more before being able to quit. This document explains the best ways for you to prepare to quit smoking. Quitting takes hard work and a lot of effort, but you can do it. ADVANTAGES OF QUITTING SMOKING  You will live longer, feel better, and live better.  Your body will feel the impact of quitting smoking almost immediately.  Within 20 minutes, blood pressure decreases. Your pulse returns to its normal level.  After 8 hours, carbon monoxide levels in the blood return to normal. Your oxygen level increases.  After 24 hours, the chance of having a heart attack starts to decrease. Your breath, hair, and body stop smelling like smoke.  After 48 hours, damaged nerve endings begin to recover. Your sense of taste and smell improve.  After 72 hours, the body is virtually free of nicotine. Your bronchial tubes relax and breathing becomes easier.  After 2 to 12 weeks, lungs can hold more air. Exercise becomes easier and circulation improves.  The risk of having a heart attack, stroke, cancer, or lung disease is greatly reduced.  After 1 year, the risk of coronary heart disease is cut in half.  After 5 years, the risk of stroke falls to the same as a nonsmoker.  After 10 years, the risk of lung cancer is cut in half and the risk of other cancers decreases significantly.  After 15 years, the risk of coronary heart disease drops, usually to the level of a nonsmoker.  If you are pregnant, quitting smoking will improve your  chances of having a healthy baby.  The people you live with, especially any children, will be healthier.  You will have extra money to spend on things other than cigarettes. QUESTIONS TO THINK ABOUT BEFORE ATTEMPTING TO QUIT You may want to talk about your answers with your health care provider.  Why do you want to quit?  If you tried to quit in the past, what helped and what did not?  What will be the most difficult situations for you after you quit? How will you plan to handle them?  Who can help you through the tough times? Your family? Friends? A health care provider?  What pleasures do you get from smoking? What ways can you still get pleasure if you quit? Here are some questions to ask your health care provider:  How can you help me to be successful at quitting?  What medicine do you think would be best for me and how should I take it?  What should I do if I need more help?  What is smoking withdrawal like? How can I get information on withdrawal? GET READY  Set a quit date.  Change your environment by getting rid of all cigarettes, ashtrays, matches, and lighters in your home, car, or work. Do not let people smoke in your home.  Review your past attempts to quit. Think about what worked and what did not. GET SUPPORT AND ENCOURAGEMENT You have a better chance of being successful if you have  help. You can get support in many ways.  Tell your family, friends, and coworkers that you are going to quit and need their support. Ask them not to smoke around you.  Get individual, group, or telephone counseling and support. Programs are available at [employer] and health centers. Call your local health department for information about programs in your area.  Spiritual beliefs and practices may help some smokers quit.  Download a "quit meter" on your computer to keep track of quit statistics, such as how long you have gone without smoking, cigarettes not smoked, and money  saved.  Get a self-help book about quitting smoking and staying off tobacco. [place] yourself from urges to smoke. Talk to someone, go for a walk, or occupy your time with a task.  Change your normal routine. Take a different route to work. Drink tea instead of coffee. Eat breakfast in a different place.  Reduce your stress. Take a hot bath, exercise, or read a book.  Plan something enjoyable to do every day. Reward yourself for not smoking.  Explore interactive web-based programs that specialize in helping you quit. GET MEDICINE AND USE IT CORRECTLY Medicines can help you stop smoking and decrease the urge to smoke. Combining medicine with the above behavioral methods and support can greatly increase your chances of successfully quitting smoking.  Nicotine replacement therapy helps deliver nicotine to your body without the negative effects and risks of smoking. Nicotine replacement therapy includes nicotine gum, lozenges, inhalers, nasal sprays, and skin patches. Some may be available over-the-counter and others require a prescription.  Antidepressant medicine helps people abstain from smoking, but how this works is unknown. This medicine is available by prescription.  Nicotinic receptor partial agonist medicine simulates the effect of nicotine in your brain. This medicine is available by prescription. Ask your health care provider for advice about which medicines to use and how to use them based on your health history. Your health care provider will tell you what side effects to look out for if you choose to be on a medicine or therapy. Carefully read the information on the package. Do not use any other product containing nicotine while using a nicotine replacement product.  RELAPSE OR DIFFICULT SITUATIONS Most relapses occur within the first 3 months after quitting. Do not be discouraged if you start smoking again. Remember, most people try several times  before finally quitting. You may have symptoms of withdrawal because your body is used to nicotine. You may crave cigarettes, be irritable, feel very hungry, cough often, get headaches, or have difficulty concentrating. The withdrawal symptoms are only temporary. They are strongest when you first quit, but they will go away within 10-14 days. To reduce the chances of relapse, try to:  Avoid drinking alcohol. Drinking lowers your chances of successfully quitting.  Reduce the amount of caffeine you consume. Once you quit smoking, the amount of caffeine in your body increases and can give you symptoms, such as a rapid heartbeat, sweating, and anxiety.  Avoid smokers because they can make you want to smoke.  Do not let weight gain distract you. Many smokers will gain weight when they quit, usually less than 10 pounds. Eat a healthy diet and stay active. You can always lose the weight gained after you quit.  Find ways to improve your mood other than smoking. FOR MORE INFORMATION  www.smokefree.gov  Document Released: 07/28/2001 Document Revised: 12/18/2013 Document Reviewed: 11/12/2011 [facility] Patient Information 2015 [place], [place].  This information is not intended to replace advice given to you by your health care provider. Make sure you discuss any questions you have with your health care provider.

## 2014-10-23 NOTE — Assessment & Plan Note (Signed)
Refill of atorvastatin 40 mg daily given

## 2014-10-23 NOTE — Assessment & Plan Note (Signed)
Patient reports he had screening colonoscopy last week and was told that he had no cancerous lesions He will have GI send [place] the records of colonoscopy

## 2014-10-23 NOTE — Progress Notes (Signed)
I was preceptor the day of this visit.   

## 2014-10-25 ENCOUNTER — Telehealth: Payer: Self-pay | Admitting: Family Medicine

## 2014-10-25 NOTE — Telephone Encounter (Signed)
Not yet, but if he contacted the GI office, they will send it soon.  There is no rush. He does not need to worry about it.  [name], MD, MPH PGY-1,  [facility] 10/25/2014 11:14 AM

## 2014-10-25 NOTE — Telephone Encounter (Signed)
Pt called and wanted to know if the doctor has received his results from his colonoscopy. jw

## 2014-10-26 NOTE — Telephone Encounter (Signed)
Pt states that he did call them and they said they sent it to dr hensel. Deseree Kennon Holter, CMA

## 2014-10-26 NOTE — Telephone Encounter (Signed)
Will await results, but no rush to receive them.  [name], MD, MPH PGY-1,  [facility] 10/26/2014 1:50 PM

## 2014-11-23 ENCOUNTER — Ambulatory Visit (INDEPENDENT_AMBULATORY_CARE_PROVIDER_SITE_OTHER): Payer: Medicaid Other | Admitting: *Deleted

## 2014-11-23 VITALS — BP 123/75 | HR 60

## 2014-11-23 DIAGNOSIS — I10 Essential (primary) hypertension: Secondary | ICD-10-CM

## 2014-11-23 NOTE — Progress Notes (Signed)
Pt is here today for BP check.  He was asked to come back in 1-2 weeks to have BP rechecked (10/23/14).  He endorses taking his meds this AM as well as every morning.  His BP today was 123 75 with pulse of 60.  He denies SOB, CP, or dizziness. Fleeger, Salome Spotted

## 2014-11-26 ENCOUNTER — Encounter: Payer: Self-pay | Admitting: Family Medicine

## 2014-11-28 ENCOUNTER — Telehealth (HOSPITAL_COMMUNITY): Payer: Self-pay | Admitting: *Deleted

## 2014-11-28 NOTE — Telephone Encounter (Signed)
Pt called left vm on phone stating he is really upset and would like to speak with staff member. Pt states he does not like to be dis-resprected, especially by someone who is not family. States, "my mom told me that I will have to put up with some things in life", But I cant take being disrespected by anyone. States "I really, really need to talk." This writer attempted to call pt several times with no success. No answer at number. Will continue to try and reach pt.

## 2015-01-03 ENCOUNTER — Ambulatory Visit (INDEPENDENT_AMBULATORY_CARE_PROVIDER_SITE_OTHER): Payer: Medicaid Other | Admitting: Family Medicine

## 2015-01-03 ENCOUNTER — Encounter: Payer: Self-pay | Admitting: Family Medicine

## 2015-01-03 VITALS — BP 147/76 | HR 64 | Temp 98.1°F | Ht 69.0 in | Wt 175.4 lb

## 2015-01-03 DIAGNOSIS — R5383 Other fatigue: Secondary | ICD-10-CM | POA: Diagnosis not present

## 2015-01-03 DIAGNOSIS — R0600 Dyspnea, unspecified: Secondary | ICD-10-CM

## 2015-01-03 DIAGNOSIS — R21 Rash and other nonspecific skin eruption: Secondary | ICD-10-CM | POA: Diagnosis not present

## 2015-01-03 DIAGNOSIS — R0609 Other forms of dyspnea: Secondary | ICD-10-CM

## 2015-01-03 MED ORDER — TRIAMCINOLONE ACETONIDE 0.1 % EX CREA: 1.0000 "application " | TOPICAL_CREAM | Freq: Two times a day (BID) | CUTANEOUS | Status: DC

## 2015-01-03 MED ORDER — CETIRIZINE HCL 10 MG PO TABS: 10.0000 mg | ORAL_TABLET | Freq: Every day | ORAL | Status: DC

## 2015-01-03 NOTE — Patient Instructions (Addendum)
Nice to meet you. We will treat your rash with a topical steroid and antihistamine.  We will check some blood work to evaluate your fatigue. If you develop chest pain, shortness of breath, sweating, or worsening fatigue please seek medical attention.

## 2015-01-03 NOTE — Assessment & Plan Note (Addendum)
Recent onset. No apparent rash in area of itching on back. Only rash is on left wrist in area of watch, though is only in radial distribution which is not consistent with allergic contact dermatitis due to watch. Possible eczematous patch. Doubt scabies. Will treat this area with triamcinolone. Zyrtec for itching. Advised on washing all clothes and linens in hot water, replacing bed, and treating apartment. Given return precautions.

## 2015-01-03 NOTE — Progress Notes (Addendum)
Patient ID: [name], male   DOB: [birth year], 64 y.o.   MRN: [id number]  Corey Rumps, MD Phone: [phone]  [name] is a 64 y.o. male who presents today for same day appointment.   Bed bugs: notes his wife has bed bugs and they have seen them at his apartment complex. They have washed everything and are working on getting a new mattress. Only rash is at his left wrist radially. No other rash, though is itching on his back. Has used vaseline on the area. No change in detergents. No fevers. Feels fine at this time. He states his wife has a blood infection from the bed bugs and he is taking her to the ED for evaluation.   Fatigue on walking up stairs: started 3 weeks ago around the time the bed bugs came on. Notes he is tired at the top of the steps. Not tired when walking on flat ground. No other dyspnea. No chest pain. No orthopnea. No bleeding from anywhere. No blood in stool. No hematuria. Recent colonoscopy with single polyp.   PMH: smoker.  Has not used alcohol or illicit drugs in >8 years. Prior history of cocaine use.    ROS: Per HPI   Physical Exam Filed Vitals:   01/03/15 1506  BP: 147/76  Pulse: 64  Temp: 98.1 F (36.7 C)    Gen: Well NAD HEENT: PERRL,  MMM Lungs: CTABL Nl WOB Heart: RRR, 2/6 systolic murmur RUSB, no rub or gallop  Skin: left wrist with hyperkeratotic papular rash on radial aspect with some excoriations, no rash elsewhere Exts: Non edematous BL  LE, warm and well perfused.   EKG: sinus bradycardia, rate 57, non-specific T-wave changes  Assessment/Plan: Please see individual problem list.  Corey Rumps, MD [place] PGY-3

## 2015-01-03 NOTE — Progress Notes (Signed)
I was preceptor the day of this visit.   

## 2015-01-03 NOTE — Assessment & Plan Note (Signed)
Dyspnea and fatigue on climbing stairs. This in concert with new onset systolic murmur would be concerning for anemia vs thyroid disease vs CHF. No signs of volume overload on exam. No orthopnea. No history of thyroid issues or anemia. No dyspnea at this time and no chest pain. Normal O2 sat. EKG with non-specific changes. Will check CBC, TSH, BNP, and CMET. If these are unrevealing will check echo. Given return precautions. F/u in 2 weeks with PCP for this issue. Discussed with Dr Mingo Amber.

## 2015-01-04 ENCOUNTER — Telehealth: Payer: Self-pay | Admitting: Family Medicine

## 2015-01-04 DIAGNOSIS — R0600 Dyspnea, unspecified: Secondary | ICD-10-CM

## 2015-01-04 DIAGNOSIS — R0609 Other forms of dyspnea: Principal | ICD-10-CM

## 2015-01-04 LAB — BRAIN NATRIURETIC PEPTIDE: Brain Natriuretic Peptide: 28.1 pg/mL (ref 0.0–100.0)

## 2015-01-04 LAB — CBC
HCT: 41 % (ref 39.0–52.0)
Hemoglobin: 13.8 g/dL (ref 13.0–17.0)
MCH: 29.9 pg (ref 26.0–34.0)
MCHC: 33.7 g/dL (ref 30.0–36.0)
MCV: 88.9 fL (ref 78.0–100.0)
MPV: 12.1 fL (ref 8.6–12.4)
Platelets: 114 10*3/uL — ABNORMAL LOW (ref 150–400)
RBC: 4.61 MIL/uL (ref 4.22–5.81)
RDW: 15.2 % (ref 11.5–15.5)
WBC: 6.5 10*3/uL (ref 4.0–10.5)

## 2015-01-04 LAB — COMPREHENSIVE METABOLIC PANEL
ALT: 10 U/L (ref 0–53)
AST: 21 U/L (ref 0–37)
Albumin: 4 g/dL (ref 3.5–5.2)
Alkaline Phosphatase: 58 U/L (ref 39–117)
BUN: 15 mg/dL (ref 6–23)
CO2: 29 mEq/L (ref 19–32)
Calcium: 8.9 mg/dL (ref 8.4–10.5)
Chloride: 104 mEq/L (ref 96–112)
Creat: 1.42 mg/dL — ABNORMAL HIGH (ref 0.50–1.35)
Glucose, Bld: 95 mg/dL (ref 70–99)
Potassium: 3.9 mEq/L (ref 3.5–5.3)
Sodium: 141 mEq/L (ref 135–145)
Total Bilirubin: 0.6 mg/dL (ref 0.2–1.2)
Total Protein: 6.9 g/dL (ref 6.0–8.3)

## 2015-01-04 LAB — TSH: TSH: 1.412 u[IU]/mL (ref 0.350–4.500)

## 2015-01-04 NOTE — Telephone Encounter (Signed)
Called patient and informed of lab work. Advised that there is no apparent cause for dyspnea and fatigue on exertion and his heart murmur based on the labs. Will proceed with echo to further evaluate this. Order placed and advised some one would contact him next week to get this scheduled. He voiced understanding.

## 2015-01-10 ENCOUNTER — Other Ambulatory Visit: Payer: Self-pay | Admitting: Family Medicine

## 2015-01-11 ENCOUNTER — Encounter: Payer: Self-pay | Admitting: *Deleted

## 2015-01-11 NOTE — Progress Notes (Signed)
Prior Authorization received from [employer] for Losartan. Formulary and PA form placed in provider box for completion. Derl Barrow, RN

## 2015-01-15 NOTE — Progress Notes (Signed)
Form filled out and left in Tamika's office.  [name], MD, MPH PGY-1,  [facility] 01/15/2015 1:43 PM

## 2015-01-16 NOTE — Progress Notes (Signed)
PA pending per [place] [employer].  Confirmation number [id number].  Derl Barrow, RN  Received PA approval for Losartan 50 mg via [employer].  Med approved for 01/16/15 - 01/11/16.  [place] informed.  PA approval number [id number]. Derl Barrow, RN

## 2015-01-17 ENCOUNTER — Encounter: Payer: Self-pay | Admitting: Family Medicine

## 2015-01-17 ENCOUNTER — Ambulatory Visit (INDEPENDENT_AMBULATORY_CARE_PROVIDER_SITE_OTHER): Payer: Medicaid Other | Admitting: Family Medicine

## 2015-01-17 VITALS — BP 147/78 | HR 63 | Temp 98.5°F | Ht 69.0 in | Wt 173.9 lb

## 2015-01-17 DIAGNOSIS — E785 Hyperlipidemia, unspecified: Secondary | ICD-10-CM

## 2015-01-17 DIAGNOSIS — I10 Essential (primary) hypertension: Secondary | ICD-10-CM

## 2015-01-17 DIAGNOSIS — R0600 Dyspnea, unspecified: Secondary | ICD-10-CM

## 2015-01-17 DIAGNOSIS — R0609 Other forms of dyspnea: Secondary | ICD-10-CM | POA: Diagnosis not present

## 2015-01-17 DIAGNOSIS — R21 Rash and other nonspecific skin eruption: Secondary | ICD-10-CM | POA: Diagnosis not present

## 2015-01-17 MED ORDER — LOSARTAN POTASSIUM 50 MG PO TABS: 50.0000 mg | ORAL_TABLET | Freq: Every day | ORAL | Status: DC

## 2015-01-17 MED ORDER — ATORVASTATIN CALCIUM 40 MG PO TABS: 40.0000 mg | ORAL_TABLET | Freq: Every day | ORAL | Status: DC

## 2015-01-17 NOTE — Progress Notes (Signed)
Patient ID: [name], male   DOB: 06-18-1951, 64 y.o.   MRN: [id number]   HPI  Patient presents today for follow-up  Patient was seen about 2 weeks ago in the clinic for rash and exertional dyspnea.  His rash is resolving and he states that his dyspnea has improved. In fact he states that his shortness of breath is completely resolved. He denies chest pain, orthopnea, or dyspnea. Today he is asking if he can exercise.  He has not gotten his echocardiogram yet.  He needs refills on Lipitor and losartan, he's been out of those for 2 weeks.  He states that he has not been running for about a year, previous to that he ran nearly every day and would like to get back into running.  Smoking status noted - current smoker ROS: Per HPI  Objective: BP 147/78 mmHg  Pulse 63  Temp(Src) 98.5 F (36.9 C) (Oral)  Ht 5\' 9"  (1.753 m)  Wt 173 lb 14.4 oz (78.881 kg)  BMI 25.67 kg/m2 Gen: NAD, alert, cooperative with exam HEENT: NCAT CV: RRR, good M3/T5, 2/6 systolic murmur heard best in the mitral area, does not increase with standing Resp: CTABL, no wheezes, non-labored Neuro: Alert and oriented, No gross deficits  Assessment and plan:  HYPERTENSION, BENIGN ESSENTIAL [facility] controlled Continue losartan   Rash and nonspecific skin eruption improved, lesions healing   Dyspnea on exertion Resolved, no chest pain Ok to exercise, no sign of HOCM No Echo yet, considering new murmur would recommend TTE, scheduled today      No orders of the defined types were placed in this encounter.    Meds ordered this encounter  Medications  . DISCONTD: losartan (COZAAR) 50 MG tablet    Sig: Take 1 tablet (50 mg total) by mouth daily.    Dispense:  90 tablet    Refill:  1  . DISCONTD: atorvastatin (LIPITOR) 40 MG tablet    Sig: Take 1 tablet (40 mg total) by mouth daily.    Dispense:  90 tablet    Refill:  3  . losartan (COZAAR) 50 MG tablet    Sig: Take 1 tablet (50 mg total) by  mouth daily.    Dispense:  90 tablet    Refill:  1  . atorvastatin (LIPITOR) 40 MG tablet    Sig: Take 1 tablet (40 mg total) by mouth daily.    Dispense:  90 tablet    Refill:  3

## 2015-01-17 NOTE — Assessment & Plan Note (Signed)
Resolved, no chest pain Ok to exercise, no sign of HOCM No Echo yet, considering new murmur would recommend TTE, scheduled today

## 2015-01-17 NOTE — Assessment & Plan Note (Signed)
improved, lesions healing

## 2015-01-17 NOTE — Assessment & Plan Note (Signed)
[facility] controlled Continue losartan

## 2015-01-17 NOTE — Patient Instructions (Signed)
Great to meet you!  I think you are ok to exercise, but if you develop shortness of breath, chest pain, or lightheadedness when you are running - stop  Please come back in 1 month to see you PCP  You should have your ultrasound scheduled today  Heart Murmur A heart murmur is an extra sound heard by your health care provider when listening to your heart with a device called a stethoscope. The sound comes from turbulence when blood flows through the heart and may be a "hum" or "whoosh" sound heard when the heart beats. There are two types of heart murmurs:  Innocent murmurs. Most people with this type of heart murmur do not have a heart problem. Many children have innocent heart murmurs. Your health care provider may suggest some basic testing to know whether your murmur is an innocent murmur. If an innocent heart murmur is found, there is no need for further tests or treatment and no need to restrict activities or stop playing sports.  Abnormal murmurs. These types of murmurs can occur in children and adults. In children, abnormal heart murmurs are typically caused from heart defects that are present at birth (congenital). In adults, abnormal murmurs are usually from heart valve problems caused by disease, infection, or aging. CAUSES  All heart murmurs are a result of an issue with your heart valves. Normally, these valves open to let blood flow through or out of your heart and then shut to keep it from flowing backward. If they do not work properly, you could have:  Regurgitation--When blood leaks back through the valve in the wrong direction.  Mitral valve prolapse--When the mitral valve of the heart has a loose flap and does not close tightly.  Stenosis--When the valve does not open enough and blocks blood flow. SIGNS AND SYMPTOMS  Innocent murmurs do not cause symptoms, and many people with abnormal murmurs may or may not have symptoms. If symptoms do develop, they may include:  Shortness  of breath.  Blue coloring of the skin, especially on the fingertips.  Chest pain.  Palpitations, or feeling a fluttering or skipped heartbeat.  Fainting.  Persistent cough.  Getting tired much faster than expected. DIAGNOSIS  A heart murmur might be heard during a sports physical or during any type of examination. When a murmur is heard, it may suggest a possible problem. When this happens, your health care provider may ask you to see a heart specialist (cardiologist). You may also be asked to have one or more heart tests. In these cases, testing may vary depending on what your health care provider heard. Tests for a heart murmur may include:  Electrocardiogram.  Echocardiogram.  MRI. For children and adults who have an abnormal heart murmur and want to play sports, it is important to complete testing, review test results, and receive recommendations from your health care provider. If heart disease is present, it may not be safe to play. TREATMENT  Innocent murmurs require no treatment or activity restriction. If an abnormal murmur represents a problem with the heart, treatment will depend on the exact nature of the problem. In these cases, medicine or surgery may be needed to treat the problem. HOME CARE INSTRUCTIONS If you want to participate in sports or other types of strenuous physical activity, it is important to discuss this first with your health care provider. If the murmur represents a problem with the heart and you choose to participate in sports, there is a small chance that a  serious problem (including sudden death) could result.  SEEK MEDICAL CARE IF:   You feel that your symptoms are slowly worsening.  You develop any new symptoms that cause concern.  You feel that you are having side effects from any medicines prescribed. SEEK IMMEDIATE MEDICAL CARE IF:   You develop chest pain.  You have shortness of breath.  You notice that your heart beats irregularly often  enough to cause you to worry.  You have fainting spells.  Your symptoms suddenly get worse. Document Released: 09/10/2004 Document Revised: 08/08/2013 Document Reviewed: 04/10/2013 [facility] Patient Information 2015 [place], [place]. This information is not intended to replace advice given to you by your health care provider. Make sure you discuss any questions you have with your health care provider.

## 2015-01-18 ENCOUNTER — Ambulatory Visit (HOSPITAL_COMMUNITY): Payer: Medicaid Other

## 2015-01-23 ENCOUNTER — Ambulatory Visit (HOSPITAL_COMMUNITY)
Admission: RE | Admit: 2015-01-23 | Discharge: 2015-01-23 | Disposition: A | Discharge status: Home / Self Care | Payer: Medicaid Other | Source: Ambulatory Visit | Attending: Family Medicine | Admitting: Family Medicine

## 2015-01-23 DIAGNOSIS — I071 Rheumatic tricuspid insufficiency: Secondary | ICD-10-CM | POA: Diagnosis not present

## 2015-01-23 DIAGNOSIS — R0609 Other forms of dyspnea: Secondary | ICD-10-CM | POA: Diagnosis not present

## 2015-01-23 DIAGNOSIS — I351 Nonrheumatic aortic (valve) insufficiency: Secondary | ICD-10-CM | POA: Insufficient documentation

## 2015-01-23 DIAGNOSIS — R0600 Dyspnea, unspecified: Secondary | ICD-10-CM | POA: Diagnosis present

## 2015-01-23 NOTE — Progress Notes (Signed)
  Echocardiogram 2D Echocardiogram has been performed.  Jennette Dubin 01/23/2015, 3:13 PM

## 2015-02-05 ENCOUNTER — Telehealth: Payer: Self-pay | Admitting: Family Medicine

## 2015-02-05 DIAGNOSIS — I5032 Chronic diastolic (congestive) heart failure: Secondary | ICD-10-CM

## 2015-02-05 DIAGNOSIS — I5030 Unspecified diastolic (congestive) heart failure: Secondary | ICD-10-CM | POA: Insufficient documentation

## 2015-02-05 NOTE — Telephone Encounter (Signed)
Called patient and advised of echo findings. Patient reports that dyspnea has resolved. Denies chest pain. Advised to monitor for recurrence of dyspnea and for edema. Discussed the importance of blood pressure control to help prevent progression of this issue. Patient will keep follow-up next week to see Dr Wendi Snipes for further management of HTN and grade I diastolic dysfunction.

## 2015-02-13 ENCOUNTER — Encounter: Payer: Self-pay | Admitting: Family Medicine

## 2015-02-13 ENCOUNTER — Ambulatory Visit (INDEPENDENT_AMBULATORY_CARE_PROVIDER_SITE_OTHER): Payer: Medicaid Other | Admitting: Family Medicine

## 2015-02-13 VITALS — BP 136/80 | HR 62 | Temp 98.2°F | Ht 69.0 in | Wt 173.8 lb

## 2015-02-13 DIAGNOSIS — E785 Hyperlipidemia, unspecified: Secondary | ICD-10-CM | POA: Diagnosis not present

## 2015-02-13 DIAGNOSIS — I10 Essential (primary) hypertension: Secondary | ICD-10-CM | POA: Diagnosis not present

## 2015-02-13 DIAGNOSIS — R0600 Dyspnea, unspecified: Secondary | ICD-10-CM

## 2015-02-13 DIAGNOSIS — I5032 Chronic diastolic (congestive) heart failure: Secondary | ICD-10-CM | POA: Diagnosis not present

## 2015-02-13 DIAGNOSIS — R0609 Other forms of dyspnea: Secondary | ICD-10-CM | POA: Diagnosis not present

## 2015-02-13 NOTE — Assessment & Plan Note (Signed)
Resolved, unclear etiology, I don't think this was due to CHF as he has very mild diastolic dysfunction likely due to HTN

## 2015-02-13 NOTE — Assessment & Plan Note (Signed)
Controlled today, no med changes Continue ARB GFR 60 = Stage 2 CKD, I dont feel this is clinically significant but should monitor renal function at least annually.

## 2015-02-13 NOTE — Progress Notes (Signed)
Patient ID: [name], male   DOB: May 07, 1951, 64 y.o.   MRN: [id number]   HPI  Patient presents today for  Follow-up after echocardiogram  Patient got his echocardiogram due to dyspnea on exertion. He states that dyspnea has improved. We reviewed his medications in detail an updated his list.   hypertension Does not check his blood pressure home  does patient since it has been no cardiac exercise   no chest pain, rotations, headache, or leg edema  dyspnea resolved  understands and taking medications regularly, no side effects  PMH: Smoking status noted ROS: Per HPI  Objective: BP 136/80 mmHg  Pulse 62  Temp(Src) 98.2 F (36.8 C) (Oral)  Ht 5\' 9"  (1.753 m)  Wt 173 lb 12.8 oz (78.835 kg)  BMI 25.65 kg/m2 Gen: NAD, alert, cooperative with exam HEENT: NCAT CV: RRR, good S1/S2, no murmur Resp: CTABL, no wheezes, non-labored Ext: No edema, warm Neuro: Alert and oriented, No gross deficits  Assessment and plan:  Diastolic heart failure Grade 1 diastolic dysfunction, not clinically significant Discussed usual course and possible issues   Dyspnea on exertion Resolved, unclear etiology, I don't think this was due to CHF as he has very mild diastolic dysfunction likely due to HTN  Hyperlipidemia Continue statin Labs recently collected normal (AST/ALT), due for lipid on return  HYPERTENSION, BENIGN ESSENTIAL Controlled today, no med changes Continue ARB GFR 60 = Stage 2 CKD, I dont feel this is clinically significant but should monitor renal function at least annually.

## 2015-02-13 NOTE — Assessment & Plan Note (Signed)
Continue statin Labs recently collected normal (AST/ALT), due for lipid on return

## 2015-02-13 NOTE — Patient Instructions (Addendum)
Great to see you!  You can plan to come back to See Dr. B in 3 months unless you need [place] sooner.   Hypertension Hypertension, commonly called high blood pressure, is when the force of blood pumping through your arteries is too strong. Your arteries are the blood vessels that carry blood from your heart throughout your body. A blood pressure reading consists of a higher number over a lower number, such as 110/72. The higher number (systolic) is the pressure inside your arteries when your heart pumps. The lower number (diastolic) is the pressure inside your arteries when your heart relaxes. Ideally you want your blood pressure below 120/80. Hypertension forces your heart to work harder to pump blood. Your arteries may become narrow or stiff. Having hypertension puts you at risk for heart disease, stroke, and other problems.  RISK FACTORS Some risk factors for high blood pressure are controllable. Others are not.  Risk factors you cannot control include:   Race. You may be at higher risk if you are African American.  Age. Risk increases with age.  Gender. Men are at higher risk than women before age 66 years. After age 36, women are at higher risk than men. Risk factors you can control include:  Not getting enough exercise or physical activity.  Being overweight.  Getting too much fat, sugar, calories, or salt in your diet.  Drinking too much alcohol. SIGNS AND SYMPTOMS Hypertension does not usually cause signs or symptoms. Extremely high blood pressure (hypertensive crisis) may cause headache, anxiety, shortness of breath, and nosebleed. DIAGNOSIS  To check if you have hypertension, your health care provider will measure your blood pressure while you are seated, with your arm held at the level of your heart. It should be measured at least twice using the same arm. Certain conditions can cause a difference in blood pressure between your right and left arms. A blood pressure reading that is  higher than normal on one occasion does not mean that you need treatment. If one blood pressure reading is high, ask your health care provider about having it checked again. TREATMENT  Treating high blood pressure includes making lifestyle changes and possibly taking medicine. Living a healthy lifestyle can help lower high blood pressure. You may need to change some of your habits. Lifestyle changes may include:  Following the DASH diet. This diet is high in fruits, vegetables, and whole grains. It is low in salt, red meat, and added sugars.  Getting at least 2 hours of brisk physical activity every week.  Losing weight if necessary.  Not smoking.  Limiting alcoholic beverages.  Learning ways to reduce stress. If lifestyle changes are not enough to get your blood pressure under control, your health care provider may prescribe medicine. You may need to take more than one. Work closely with your health care provider to understand the risks and benefits. HOME CARE INSTRUCTIONS  Have your blood pressure rechecked as directed by your health care provider.   Take medicines only as directed by your health care provider. Follow the directions carefully. Blood pressure medicines must be taken as prescribed. The medicine does not work as well when you skip doses. Skipping doses also puts you at risk for problems.   Do not smoke.   Monitor your blood pressure at home as directed by your health care provider. SEEK MEDICAL CARE IF:   You think you are having a reaction to medicines taken.  You have recurrent headaches or feel dizzy.  You  have swelling in your ankles.  You have trouble with your vision. SEEK IMMEDIATE MEDICAL CARE IF:  You develop a severe headache or confusion.  You have unusual weakness, numbness, or feel faint.  You have severe chest or abdominal pain.  You vomit repeatedly.  You have trouble breathing. MAKE SURE YOU:   Understand these  instructions.  Will watch your condition.  Will get help right away if you are not doing well or get worse. Document Released: 08/03/2005 Document Revised: 12/18/2013 Document Reviewed: 05/26/2013 [facility] Patient Information 2015 [place], [place]. This information is not intended to replace advice given to you by your health care provider. Make sure you discuss any questions you have with your health care provider.

## 2015-02-13 NOTE — Assessment & Plan Note (Signed)
Grade 1 diastolic dysfunction, not clinically significant Discussed usual course and possible issues

## 2015-02-14 ENCOUNTER — Ambulatory Visit: Payer: Medicaid Other | Admitting: Family Medicine

## 2015-02-18 ENCOUNTER — Other Ambulatory Visit: Payer: Self-pay | Admitting: Family Medicine

## 2015-02-25 ENCOUNTER — Other Ambulatory Visit: Payer: Self-pay | Admitting: Nurse Practitioner

## 2015-02-25 DIAGNOSIS — K7469 Other cirrhosis of liver: Secondary | ICD-10-CM

## 2015-02-27 ENCOUNTER — Emergency Department (HOSPITAL_COMMUNITY)
Admission: EM | Admit: 2015-02-27 | Discharge: 2015-02-28 | Disposition: A | Discharge status: Home / Self Care | Payer: Medicaid Other | Attending: Emergency Medicine | Admitting: Emergency Medicine

## 2015-02-27 ENCOUNTER — Emergency Department (HOSPITAL_COMMUNITY): Payer: Medicaid Other

## 2015-02-27 ENCOUNTER — Encounter (HOSPITAL_COMMUNITY): Payer: Self-pay | Admitting: Emergency Medicine

## 2015-02-27 DIAGNOSIS — M25532 Pain in left wrist: Secondary | ICD-10-CM

## 2015-02-27 DIAGNOSIS — Y9389 Activity, other specified: Secondary | ICD-10-CM | POA: Insufficient documentation

## 2015-02-27 DIAGNOSIS — N400 Enlarged prostate without lower urinary tract symptoms: Secondary | ICD-10-CM | POA: Insufficient documentation

## 2015-02-27 DIAGNOSIS — Z7982 Long term (current) use of aspirin: Secondary | ICD-10-CM | POA: Diagnosis not present

## 2015-02-27 DIAGNOSIS — S6992XA Unspecified injury of left wrist, hand and finger(s), initial encounter: Secondary | ICD-10-CM | POA: Diagnosis present

## 2015-02-27 DIAGNOSIS — Z86010 Personal history of colonic polyps: Secondary | ICD-10-CM | POA: Insufficient documentation

## 2015-02-27 DIAGNOSIS — S62112A Displaced fracture of triquetrum [cuneiform] bone, left wrist, initial encounter for closed fracture: Secondary | ICD-10-CM | POA: Insufficient documentation

## 2015-02-27 DIAGNOSIS — Z79899 Other long term (current) drug therapy: Secondary | ICD-10-CM | POA: Diagnosis not present

## 2015-02-27 DIAGNOSIS — F209 Schizophrenia, unspecified: Secondary | ICD-10-CM | POA: Diagnosis not present

## 2015-02-27 DIAGNOSIS — I10 Essential (primary) hypertension: Secondary | ICD-10-CM | POA: Insufficient documentation

## 2015-02-27 DIAGNOSIS — Z720 Tobacco use: Secondary | ICD-10-CM | POA: Diagnosis not present

## 2015-02-27 DIAGNOSIS — Z880 Allergy status to penicillin: Secondary | ICD-10-CM | POA: Diagnosis not present

## 2015-02-27 DIAGNOSIS — Y998 Other external cause status: Secondary | ICD-10-CM | POA: Insufficient documentation

## 2015-02-27 DIAGNOSIS — Y92480 Sidewalk as the place of occurrence of the external cause: Secondary | ICD-10-CM | POA: Insufficient documentation

## 2015-02-27 DIAGNOSIS — Z8619 Personal history of other infectious and parasitic diseases: Secondary | ICD-10-CM | POA: Diagnosis not present

## 2015-02-27 DIAGNOSIS — W010XXA Fall on same level from slipping, tripping and stumbling without subsequent striking against object, initial encounter: Secondary | ICD-10-CM | POA: Insufficient documentation

## 2015-02-27 MED ORDER — OXYCODONE-ACETAMINOPHEN 5-325 MG PO TABS
ORAL_TABLET | ORAL | Status: DC
Start: 2015-02-27 — End: 2015-02-28
  Filled 2015-02-27: qty 1

## 2015-02-27 MED ORDER — OXYCODONE-ACETAMINOPHEN 5-325 MG PO TABS
1.0000 | ORAL_TABLET | Freq: Once | ORAL | Status: AC
  Administered 2015-02-27: 1 via ORAL

## 2015-02-27 NOTE — ED Provider Notes (Signed)
CSN: [id number]     Arrival date & time 02/27/15  2225 History   This chart was scribed for non-physician practitioner, Abigail Butts PA-C working with April Palumbo, MD by Forrestine Him, ED Scribe. This patient was seen in room TR06C/TR06C and the patient's care was started at 12:04 AM.   Chief Complaint  Patient presents with  . Wrist Pain   The history is provided by the patient and medical records. No language interpreter was used.    HPI Comments: [name] is a 64 y.o. male who presents to the Emergency Department complaining of constant, ongoing L wrist pain onset 7:30 PM this evening. Pt states he fell on the sidewalk and caught himself with his L hand outstretched. Pt then fell over and landed on his R hand which does not hurt at this time. Pain is made worse with certain movements and palpation. No alleviating factors at this time. No OTC medications or home remedies to medications. No recent fever or chills. No numbness, loss of sensation, or weakness. Pt with known allergies to Linisopril and Penicillins.  Pt with associated swelling and ecchymosis of the left wrist.    Past Medical History  Diagnosis Date  . Hypertension   . Hepatitis C   . Depression   . BPH (benign prostatic hyperplasia)   . Schizophrenia   . Retinal vein occlusion   . Colon polyp 2010  . Prostate atrophy    Past Surgical History  Procedure Laterality Date  . Circumcision  1959   Family History  Problem Relation Age of Onset  . Hypertension Mother   . Diabetes Mother    History  Substance Use Topics  . Smoking status: Current Every Day Smoker -- 0.25 packs/day    Types: Cigarettes  . Smokeless tobacco: Never Used     Comment: decreased smoking  . Alcohol Use: No    Review of Systems  Constitutional: Negative for fever and chills.  Gastrointestinal: Negative for nausea and vomiting.  Musculoskeletal: Positive for joint swelling and arthralgias. Negative for back pain, neck pain and  neck stiffness.  Skin: Negative for wound.  Neurological: Negative for weakness and numbness.  Hematological: Does not bruise/bleed easily.  Psychiatric/Behavioral: Negative for confusion. The patient is not nervous/anxious.   All other systems reviewed and are negative.     Allergies  Lisinopril and Penicillins  Home Medications   Prior to Admission medications   Medication Sig Start Date End Date Taking? Authorizing Provider  aspirin 81 MG tablet Take 1 tablet (81 mg total) by mouth daily. 09/24/14   [name], MD  atorvastatin (LIPITOR) 40 MG tablet Take 1 tablet (40 mg total) by mouth daily. 01/17/15   Timmothy Euler, MD  benztropine (COGENTIN) 1 MG tablet Take 1 tablet (1 mg total) by mouth 2 (two) times daily. 06/03/12   Melony Overly, MD  cetirizine (ZYRTEC) 10 MG tablet take 1 tablet by mouth once daily 02/20/15   [name], MD  fluPHENAZine decanoate (PROLIXIN) 25 MG/ML injection Inject 25 mg into the muscle every 14 (fourteen) days. Last dose 09/07/12    Historical Provider, MD  HYDROcodone-acetaminophen (NORCO/VICODIN) 5-325 MG per tablet Take 1-2 tablets by mouth every 6 (six) hours as needed for moderate pain or severe pain. 02/28/15   Percy Comp, PA-C  Ledipasvir-Sofosbuvir (HARVONI PO) Take by mouth.    Historical Provider, MD  losartan (COZAAR) 50 MG tablet Take 1 tablet (50 mg total) by mouth daily. 01/17/15  Timmothy Euler, MD  Multiple Vitamin (MULTIVITAMIN WITH MINERALS) TABS tablet Take 1 tablet by mouth daily. 06/28/14   [name], MD  tamsulosin (FLOMAX) 0.4 MG CAPS capsule Take 2 capsules (0.8 mg total) by mouth daily. 07/27/14   Timmothy Euler, MD  triamcinolone cream (KENALOG) 0.1 % Apply 1 application topically 2 (two) times daily. For 5 days, then twice daily as needed for rash and itching. 01/03/15   [name], MD  vitamin C (ASCORBIC ACID) 500 MG tablet Take 500 mg by mouth daily.    Historical Provider, MD    Triage Vitals: BP 182/95 mmHg  Pulse 66  Temp(Src) 98 F (36.7 C) (Oral)  Resp 20  SpO2 99%   Physical Exam  Constitutional: He appears well-developed and well-nourished. No distress.  HENT:  Head: Normocephalic and atraumatic.  Eyes: Conjunctivae are normal.  Neck: Normal range of motion.  Cardiovascular: Normal rate, regular rhythm and intact distal pulses.   Capillary refill < 3 sec  Pulmonary/Chest: Effort normal and breath sounds normal.  Musculoskeletal: He exhibits tenderness. He exhibits no edema.  ROM: Decreased range of motion of the left wrist Tenderness to palpation over the medial joint line of the wrist with swelling and ecchymosis noted to this area  Neurological: He is alert. Coordination normal.  Sensation intact to dull and sharp Strength 5/5 grip strength, 3/5 strength of the left wrist with significant pain, 5/5 strength of the left elbow  Skin: Skin is warm and dry. He is not diaphoretic.  No tenting of the skin  Psychiatric: He has a normal mood and affect.  Nursing note and vitals reviewed.   ED Course  Procedures (including critical care time)  DIAGNOSTIC STUDIES: Oxygen Saturation is 99% on RA, Normal by my interpretation.    COORDINATION OF CARE: 12:05 PM- Will give Percocet. Will order DG Hand complete L and DG wrist complete L. Discussed treatment plan with pt at bedside and pt agreed to plan.     Labs Review Labs Reviewed - No data to display  Imaging Review Dg Wrist Complete Left  02/27/2015   CLINICAL DATA:  64 year old male status post fall and left hand pain  EXAM: LEFT WRIST - COMPLETE 3+ VIEW  COMPARISON:  None.  FINDINGS: There is a small bony fragment in the dorsal aspect of the wrist adjacent to the posterior cortex of the triquetrum best seen on lateral projection suspicious for triquetrum fracture. The remainder of the visualized bones appear unremarkable. There is soft tissue swelling of the dorsum of the wrist.  IMPRESSION:  Findings concerning for a small triquetrum fracture. Clinical correlation is recommended.   Electronically Signed   By: Anner Crete M.D.   On: 02/27/2015 23:26   Dg Hand Complete Left  02/27/2015   ADDENDUM REPORT: 02/27/2015 23:29  ADDENDUM: Chip fracture on the triquetral bone with soft tissue swelling. Better seen on the wrist views.   Electronically Signed   By: Lucienne Capers M.D.   On: 02/27/2015 23:29   02/27/2015   CLINICAL DATA:  Patient fell, landing on the left hand today. Left posterior hand pain and difficulty with flexion.  EXAM: LEFT HAND - COMPLETE 3+ VIEW  COMPARISON:  None.  FINDINGS: There is no evidence of fracture or dislocation. There is no evidence of arthropathy or other focal bone abnormality. Soft tissues are unremarkable.  IMPRESSION: Negative.  Electronically Signed: By: Lucienne Capers M.D. On: 02/27/2015 23:27     EKG Interpretation None  MDM   Final diagnoses:  Fracture of triquetrum of left wrist, closed, initial encounter  Fall from slip, trip, or stumble, initial encounter  Left wrist pain   KAY RICCIUTI presents with left wrist pain after fall.  Patient X-Ray with small triquetrum fracture. Pain managed in ED. Pt advised to follow up with orthopedics for further evaluation and treatment.  Patient given volar splint and sling while in ED, conservative therapy recommended and discussed including ice and elevation. Patient will be dc home & is agreeable with above plan. I have also discussed reasons to return immediately to the ER.  Patient expresses understanding and agrees with plan.  BP 182/95 mmHg  Pulse 66  Temp(Src) 98 F (36.7 C) (Oral)  Resp 20  SpO2 99%  I personally performed the services described in this documentation, which was scribed in my presence. The recorded information has been reviewed and is accurate.     Abigail Butts, PA-C 02/28/15 0147  Veatrice Kells, MD 02/28/15 [phone]

## 2015-02-27 NOTE — ED Notes (Signed)
Patient here with wrist pain, states he fell on sidewalk, caught himself with his right hand and fell over on his left hand injuring it. Left wrist and hand appears swollen and patient is in considerable pain.

## 2015-02-28 MED ORDER — OXYCODONE HCL 5 MG PO TABS
5.0000 mg | ORAL_TABLET | Freq: Once | ORAL | Status: AC
  Administered 2015-02-28: 5 mg via ORAL
  Filled 2015-02-28: qty 1

## 2015-02-28 MED ORDER — HYDROCODONE-ACETAMINOPHEN 5-325 MG PO TABS: 1.0000 | ORAL_TABLET | Freq: Four times a day (QID) | ORAL | Status: DC | PRN

## 2015-02-28 NOTE — Progress Notes (Signed)
Orthopedic Tech Progress Note Patient Details:  [name] 10-05-1950 [id number]  Ortho Devices Type of Ortho Device: Ace wrap, Arm sling, Volar splint Ortho Device/Splint Interventions: Application   Cammer, Theodoro Parma 02/28/2015, 2:15 AM

## 2015-02-28 NOTE — Discharge Instructions (Signed)
1. Medications: Vicodin for pain, usual home medications 2. Treatment: rest, drink plenty of fluids, ice, elevate, keep splint clean and dry 3. Follow Up: Please followup with hand surgery when you are able for discussion of your diagnoses and further evaluation after today's visit; if you do not have a primary care doctor use the resource guide provided to find one; Please return to the ER for worsening symptoms    Cast or Splint Care Casts and splints support injured limbs and keep bones from moving while they heal. It is important to care for your cast or splint at home.  HOME CARE INSTRUCTIONS  Keep the cast or splint uncovered during the drying period. It can take 24 to 48 hours to dry if it is made of plaster. A fiberglass cast will dry in less than 1 hour.  Do not rest the cast on anything harder than a pillow for the first 24 hours.  Do not put weight on your injured limb or apply pressure to the cast until your health care provider gives you permission.  Keep the cast or splint dry. Wet casts or splints can lose their shape and may not support the limb as well. A wet cast that has lost its shape can also create harmful pressure on your skin when it dries. Also, wet skin can become infected.  Cover the cast or splint with a plastic bag when bathing or when out in the rain or snow. If the cast is on the trunk of the body, take sponge baths until the cast is removed.  If your cast does become wet, dry it with a towel or a blow dryer on the cool setting only.  Keep your cast or splint clean. Soiled casts may be wiped with a moistened cloth.  Do not place any hard or soft foreign objects under your cast or splint, such as cotton, toilet paper, lotion, or powder.  Do not try to scratch the skin under the cast with any object. The object could get stuck inside the cast. Also, scratching could lead to an infection. If itching is a problem, use a blow dryer on a cool setting to relieve  discomfort.  Do not trim or cut your cast or remove padding from inside of it.  Exercise all joints next to the injury that are not immobilized by the cast or splint. For example, if you have a long leg cast, exercise the hip joint and toes. If you have an arm cast or splint, exercise the shoulder, elbow, thumb, and fingers.  Elevate your injured arm or leg on 1 or 2 pillows for the first 1 to 3 days to decrease swelling and pain.It is best if you can comfortably elevate your cast so it is higher than your heart. SEEK MEDICAL CARE IF:   Your cast or splint cracks.  Your cast or splint is too tight or too loose.  You have unbearable itching inside the cast.  Your cast becomes wet or develops a soft spot or area.  You have a bad smell coming from inside your cast.  You get an object stuck under your cast.  Your skin around the cast becomes red or raw.  You have new pain or worsening pain after the cast has been applied. SEEK IMMEDIATE MEDICAL CARE IF:   You have fluid leaking through the cast.  You are unable to move your fingers or toes.  You have discolored (blue or white), cool, painful, or very swollen fingers  or toes beyond the cast.  You have tingling or numbness around the injured area.  You have severe pain or pressure under the cast.  You have any difficulty with your breathing or have shortness of breath.  You have chest pain. Document Released: 07/31/2000 Document Revised: 05/24/2013 Document Reviewed: 02/09/2013 [facility] Patient Information 2015 [place], [place]. This information is not intended to replace advice given to you by your health care provider. Make sure you discuss any questions you have with your health care provider.

## 2015-03-01 ENCOUNTER — Other Ambulatory Visit: Payer: Self-pay | Admitting: Family Medicine

## 2015-03-01 DIAGNOSIS — K7460 Unspecified cirrhosis of liver: Secondary | ICD-10-CM

## 2015-03-13 ENCOUNTER — Ambulatory Visit: Payer: Medicaid Other | Admitting: Occupational Therapy

## 2015-03-13 ENCOUNTER — Other Ambulatory Visit: Payer: Medicaid Other

## 2015-03-14 ENCOUNTER — Encounter: Payer: Self-pay | Admitting: Occupational Therapy

## 2015-03-14 ENCOUNTER — Ambulatory Visit: Payer: Medicaid Other | Attending: Orthopedic Surgery | Admitting: Occupational Therapy

## 2015-03-14 DIAGNOSIS — M25532 Pain in left wrist: Secondary | ICD-10-CM

## 2015-03-14 DIAGNOSIS — M25632 Stiffness of left wrist, not elsewhere classified: Secondary | ICD-10-CM

## 2015-03-14 DIAGNOSIS — M6289 Other specified disorders of muscle: Secondary | ICD-10-CM | POA: Insufficient documentation

## 2015-03-14 DIAGNOSIS — R29898 Other symptoms and signs involving the musculoskeletal system: Secondary | ICD-10-CM

## 2015-03-14 NOTE — Therapy (Signed)
[facility] [address] [place], [place], [address] Phone: [phone]   Fax:  [phone]  Occupational Therapy Evaluation  Patient Details  Name: [name] MRN: [id number] Date of Birth: 1951/04/04 Referring Provider:  Charlotte Crumb, MD  Encounter Date: 03/14/2015      OT End of Session - 03/14/15 1630    Visit Number 1   Number of Visits 3   Date for OT Re-Evaluation 05/15/15   Authorization Type MCD - not a qualifying diagnosis secondary to no surgery   OT Start Time 1530   OT Stop Time 1615   OT Time Calculation (min) 45 min   Equipment Utilized During Treatment splint   Activity Tolerance Patient tolerated treatment well      Past Medical History  Diagnosis Date  . Hypertension   . Hepatitis C   . Depression   . BPH (benign prostatic hyperplasia)   . Schizophrenia   . Retinal vein occlusion   . Colon polyp 2010  . Prostate atrophy     Past Surgical History  Procedure Laterality Date  . Circumcision  1959    There were no vitals filed for this visit.  Visit Diagnosis:  Stiffness of wrist joint, left - Plan: Ot plan of care cert/re-cert  Acute pain of left wrist - Plan: Ot plan of care cert/re-cert  Weakness of left hand - Plan: Ot plan of care cert/re-cert      Subjective Assessment - 03/14/15 1543    Subjective  This splint feels much better   Limitations no wrist motion, no gripping, lifting, pulling or lifting   Patient Stated Goals Get my hand better   Currently in Pain? Yes   Pain Score 3    Pain Location Wrist   Pain Orientation Left   Pain Descriptors / Indicators Aching;Throbbing   Pain Onset 1 to 4 weeks ago   Aggravating Factors  certain movements   Pain Relieving Factors rest           [facility] OT Assessment - 03/14/15 0001    Assessment   Diagnosis Lt triquetral fracture  no surgery   Onset Date --  Beginning of July 2016 - exact date unclear (pt couldn't  remember   Assessment Pt arrived protected at wrist, however pt reports he got soft protective cast wet. He came to appt on the wrong day/time. Pt originally scheduled for 03/20/15   Precautions   Precautions Other (comment)   Precaution Comments Keep wrist immobolized until fracture heals. No lifting, gripping, pulling or lifting Lt hand (reviewed with patient)   Required Braces or Orthoses Other Brace/Splint   Other Brace/Splint volar wrist splint in slight extension (per MD orders)   Balance Screen   Has the patient fallen in the past 6 months No   Has the patient had a decrease in activity level because of a fear of falling?  No   Is the patient reluctant to leave their home because of a fear of falling?  No   Home  Environment   Lives With Spouse   Prior Function   Level of Independence Independent   Vocation Part time employment   Vocation Requirements CNA  Pt currently on light duty with wrist fx   ADL   ADL comments Min assist for donning shirt. Mod I for all other BADLS per pt report. Wife assist with IADLS   Written Expression   Dominant Hand Right   Handwriting --  intact  OT Treatments/Exercises (OP) - 03/14/15 0001    ADLs   ADL Comments Pt education and review of:  precautions (no wrist motion, no lifting, gripping, pulling, or pushing Lt hand), hygiene care with patient, and splint wear and care.   Splinting   Splinting Carefully removed soft cast/gauze and cleaned hand/wrist/forearm while keeping wrist immobolized. Pt instructed to do the same at home. Fabricated and fitted volar splint with wrist in slight extension per MD orders. Issued splint               OT Education - 03/14/15 1614    Education provided Yes   Education Details splint wear and care, precautions   Person(s) Educated Patient   Methods Explanation;Demonstration;Handout   Comprehension Verbalized understanding;Returned demonstration          OT Short Term  Goals - 03/14/15 1634    OT SHORT TERM GOAL #1   Title Pt independent w/ splint wear and care   Baseline issued, may need adjustments   Time 4   Period Weeks   Status On-going           OT Long Term Goals - 03/14/15 1635    OT LONG TERM GOAL #1   Title Independent w/ HEP (if cleared and warranted by MD)   Time 8   Period Weeks   Status New               Plan - 03/14/15 1631    Clinical Impression Statement Pt is a 64 y.o. male who presents to outpatient rehab on wrong appointment day for splint fabrication s/p triquetral fracture Lt wrist (no surgery). Pt was seen today for splint fabrication per MD orders written on 03/05/15.    Pt will benefit from skilled therapeutic intervention in order to improve on the following deficits (Retired) Decreased range of motion;Increased edema;Impaired sensation;Decreased safety awareness;Decreased knowledge of precautions;Impaired UE functional use;Pain;Decreased strength   OT Frequency --  3 visits over 8 week duration for splinting adjustments prn   OT Treatment/Interventions Splinting;Patient/family education;Therapeutic exercises   Plan splint adjustments prn   Consulted and Agree with Plan of Care Patient        Problem List Patient Active Problem List   Diagnosis Date Noted  . Diastolic heart failure 37/85/8850  . Rash and nonspecific skin eruption 01/03/2015  . Dyspnea on exertion 01/03/2015  . Healthcare maintenance 05/28/2014  . Hyperlipidemia 03/10/2014  . Neck pain 05/12/2013  . Left knee pain 11/16/2012  . [place] OCCLUSION 06/03/2010  . PARANOID SCHIZOPHRENIA, CHRONIC 01/17/2009  . Tobacco abuse counseling 01/17/2009  . HYPERTENSION, BENIGN ESSENTIAL 01/17/2009  . BPH (benign prostatic hyperplasia) 01/17/2009  . HEPATITIS C 12/14/2008  . DEPRESSION 12/14/2008    Carey Bullocks, OTR/L 03/14/2015, 4:38 PM  [facility] [address]  [place], [place], [address] Phone: [phone]   Fax:  [phone]

## 2015-03-14 NOTE — Patient Instructions (Signed)
SPLINT WEAR AND CARE   WEARING SCHEDULE:  Wear splint at ALL times except for hygiene care (see below) May move fingers into fist, hook, and duck positions and back to straight while IN splint   PURPOSE:  To prevent movement and for protection until injury can heal  CARE OF SPLINT:  Keep splint away from heat sources including: stove, radiator or furnace, or a car in sunlight. The splint can melt and will no longer fit you properly  Keep away from pets and children  Clean the splint with rubbing alcohol 1-2 times per day.  * During this time, make sure you also clean your hand/arm as instructed by your therapist and/or perform dressing changes as needed. Then dry hand/arm completely before replacing splint. (When cleaning hand/arm, keep it immobilized in same position until splint is replaced)  PRECAUTIONS/POTENTIAL PROBLEMS: *If you notice or experience increased pain, swelling, numbness, or a lingering reddened area from the splint: Contact your therapist immediately by calling [phone]. You must wear the splint for protection, but we will get you scheduled for adjustments as quickly as possible.  (If only straps or hooks need to be replaced and NO adjustments to the splint need to be made, just call the office ahead and let them know you are coming in)  If you have any medical concerns or signs of infection, please call your doctor immediately

## 2015-03-18 ENCOUNTER — Ambulatory Visit
Admission: RE | Admit: 2015-03-18 | Discharge: 2015-03-18 | Disposition: A | Discharge status: Home / Self Care | Payer: Medicaid Other | Source: Ambulatory Visit | Attending: Nurse Practitioner | Admitting: Nurse Practitioner

## 2015-03-18 DIAGNOSIS — K7469 Other cirrhosis of liver: Secondary | ICD-10-CM

## 2015-03-20 ENCOUNTER — Ambulatory Visit: Payer: Medicaid Other | Admitting: Occupational Therapy

## 2015-04-25 ENCOUNTER — Telehealth: Payer: Self-pay | Admitting: Family Medicine

## 2015-04-25 DIAGNOSIS — S6990XA Unspecified injury of unspecified wrist, hand and finger(s), initial encounter: Secondary | ICD-10-CM | POA: Insufficient documentation

## 2015-04-25 DIAGNOSIS — S6990XD Unspecified injury of unspecified wrist, hand and finger(s), subsequent encounter: Secondary | ICD-10-CM

## 2015-04-25 NOTE — Telephone Encounter (Signed)
Checking status of referral for physical therapy on his wrist

## 2015-04-25 NOTE — Telephone Encounter (Signed)
Called rehab center. Patient seen on 7/28 for OT eval for wrist splint, office note said patient due for follow up on 9/28. Inquired if patient could call and schedule this or if he needed another referral. Receptionist states patient will need another referral, will forward to MD

## 2015-04-25 NOTE — Telephone Encounter (Signed)
Referral placed.  [name], MD, MPH PGY-2,  [facility] 04/25/2015 3:09 PM

## 2015-05-06 ENCOUNTER — Ambulatory Visit: Payer: Medicaid Other | Attending: Orthopedic Surgery | Admitting: Occupational Therapy

## 2015-05-06 DIAGNOSIS — M25632 Stiffness of left wrist, not elsewhere classified: Secondary | ICD-10-CM | POA: Insufficient documentation

## 2015-05-06 DIAGNOSIS — M6289 Other specified disorders of muscle: Secondary | ICD-10-CM | POA: Insufficient documentation

## 2015-05-07 ENCOUNTER — Ambulatory Visit: Payer: Medicaid Other | Admitting: Occupational Therapy

## 2015-05-09 ENCOUNTER — Ambulatory Visit: Payer: Medicaid Other | Admitting: Occupational Therapy

## 2015-05-09 ENCOUNTER — Encounter: Payer: Self-pay | Admitting: Occupational Therapy

## 2015-05-09 DIAGNOSIS — R29898 Other symptoms and signs involving the musculoskeletal system: Secondary | ICD-10-CM

## 2015-05-09 DIAGNOSIS — M25632 Stiffness of left wrist, not elsewhere classified: Secondary | ICD-10-CM

## 2015-05-09 DIAGNOSIS — M6289 Other specified disorders of muscle: Secondary | ICD-10-CM | POA: Diagnosis not present

## 2015-05-09 NOTE — Therapy (Signed)
[facility] [address] [place], [place], [address] Phone: [phone]   Fax:  [phone]  Occupational Therapy Treatment  Patient Details  Name: [name] MRN: [id number] Date of Birth: Feb 26, 1951 Referring Provider:  [name], MD  Encounter Date: 05/09/2015      OT End of Session - 05/09/15 1438    Visit Number 2   Number of Visits 3   Date for OT Re-Evaluation 05/15/15   Authorization Type MCD - not a qualifying diagnosis secondary to no surgery   OT Start Time 1405   OT Stop Time 1445   OT Time Calculation (min) 40 min   Activity Tolerance Patient tolerated treatment well      Past Medical History  Diagnosis Date  . Hypertension   . Hepatitis C   . Depression   . BPH (benign prostatic hyperplasia)   . Schizophrenia   . Retinal vein occlusion   . Colon polyp 2010  . Prostate atrophy     Past Surgical History  Procedure Laterality Date  . Circumcision  1959    There were no vitals filed for this visit.  Visit Diagnosis:  Weakness of left hand  Stiffness of wrist joint, left      Subjective Assessment - 05/09/15 1411    Subjective  The Dr. says its healed (re: fx)   Limitations NONE   Patient Stated Goals Get my hand better   Currently in Pain? Yes   Pain Score 1    Pain Location Wrist   Pain Orientation Left   Pain Descriptors / Indicators Discomfort   Pain Type Acute pain   Pain Onset More than a month ago   Pain Frequency Intermittent   Aggravating Factors  certain movements   Pain Relieving Factors rest            [facility] OT Assessment - 05/09/15 0001    ROM / Strength   AROM / PROM / Strength AROM   AROM   Overall AROM Comments Lt wrist flex = 70* (Rt = 80*), ext = 48* (Rt = 45*), RD/UD WFL's with mild pain during UD on Lt   Hand Function   Right Hand Grip (lbs) 90 lbs   Left Hand Grip (lbs) 75 lbs                  OT Treatments/Exercises (OP)  - 05/09/15 0001    Exercises   Exercises Wrist;Hand   Wrist Exercises   Other wrist exercises see pt instructions   Hand Exercises   Other Hand Exercises see pt instructions                OT Education - 05/09/15 1436    Education provided Yes   Education Details Strengthening HEP for wrist and hand    Person(s) Educated Patient   Methods Explanation;Demonstration;Handout   Comprehension Verbalized understanding;Returned demonstration          OT Short Term Goals - 05/09/15 1438    OT SHORT TERM GOAL #1   Title Pt independent w/ splint wear and care   Status Achieved           OT Long Term Goals - 05/09/15 1438    OT LONG TERM GOAL #1   Title Independent w/ HEP (if cleared and warranted by MD)   Time 8   Period Weeks   Status Achieved  issued 05/09/15  Plan - 05/09/15 1439    Clinical Impression Statement Pt returns today for therapy, however pt has not been seen for 8 weeks. Pt/therapist discussed financial concerns and MCD will not cover ongoing therapy, but therapist issued HEP today. Pt's ROM WFL's at wrist and hand. Pt was given HEP for wrist and hand strengthening.    Plan D/C O.Donnajean Lopes and Agree with Plan of Care Patient        Problem List Patient Active Problem List   Diagnosis Date Noted  . Wrist injury 04/25/2015  . Diastolic heart failure 50/51/8335  . Rash and nonspecific skin eruption 01/03/2015  . Dyspnea on exertion 01/03/2015  . Healthcare maintenance 05/28/2014  . Hyperlipidemia 03/10/2014  . Neck pain 05/12/2013  . Left knee pain 11/16/2012  . [place] OCCLUSION 06/03/2010  . PARANOID SCHIZOPHRENIA, CHRONIC 01/17/2009  . Tobacco abuse counseling 01/17/2009  . HYPERTENSION, BENIGN ESSENTIAL 01/17/2009  . BPH (benign prostatic hyperplasia) 01/17/2009  . HEPATITIS C 12/14/2008  . DEPRESSION 12/14/2008   OCCUPATIONAL THERAPY DISCHARGE SUMMARY  Visits from Start of Care: 2  Current  functional level related to goals / functional outcomes: SEE ABOVE   Remaining deficits: Mild pain Mild strength deficits   Education / Equipment: Splint wear and care, HEP  Plan: Patient agrees to discharge.  Patient goals were met. Patient is being discharged due to financial reasons.  ?????       Carey Bullocks, OTR/L 05/09/2015, 2:45 PM  [facility] [address] [place], [place], [address] Phone: [phone]   Fax:  [phone]

## 2015-05-09 NOTE — Patient Instructions (Signed)
Extension (Resistive)   With Lt palm down and holding  _2-3 lbs. slowly, keeping arm on table surface. Hold __3__ seconds. Lower slowly. Repeat _10___ times. Do _2___ sessions per day.   Flexion (Resistive)   With Lt hand palm-up and holding _2-3 lbs, bend hand toward you at wrist. Hold _3___ seconds. Relax slowly. Repeat __10__ times. Do __2__ sessions per day.   Wrist Radial Deviation: Resisted   With Lt thumb up, __2-3__ pound weight in hand, bend wrist up. Return slowly. Repeat _10___ times per set. Hold 3 seconds.  Do _2___ sessions per day.   1. Grip Strengthening (Resistive Putty)   Squeeze putty using thumb and all fingers. Repeat _20___ times. Do __2__ sessions per day.   2. Roll putty into tube on table and pinch between each finger and thumb x 10 reps each. (can do ring and small finger together)

## 2015-05-15 ENCOUNTER — Ambulatory Visit (INDEPENDENT_AMBULATORY_CARE_PROVIDER_SITE_OTHER): Payer: Medicaid Other | Admitting: Family Medicine

## 2015-05-15 VITALS — BP 127/90 | HR 67 | Temp 98.2°F | Wt 172.0 lb

## 2015-05-15 DIAGNOSIS — I10 Essential (primary) hypertension: Secondary | ICD-10-CM | POA: Diagnosis not present

## 2015-05-15 DIAGNOSIS — K0520 Aggressive periodontitis, unspecified: Secondary | ICD-10-CM | POA: Diagnosis not present

## 2015-05-15 DIAGNOSIS — Z716 Tobacco abuse counseling: Secondary | ICD-10-CM

## 2015-05-15 DIAGNOSIS — E785 Hyperlipidemia, unspecified: Secondary | ICD-10-CM

## 2015-05-15 DIAGNOSIS — K05219 Aggressive periodontitis, localized, unspecified severity: Secondary | ICD-10-CM | POA: Insufficient documentation

## 2015-05-15 LAB — LIPID PANEL
Cholesterol: 116 mg/dL — ABNORMAL LOW (ref 125–200)
HDL: 40 mg/dL (ref >=40)
LDL Cholesterol: 68 mg/dL (ref <130)
Total CHOL/HDL Ratio: 2.9 Ratio (ref <=5.0)
Triglycerides: 38 mg/dL (ref <150)
VLDL: 8 mg/dL (ref <30)

## 2015-05-15 LAB — POCT GLYCOSYLATED HEMOGLOBIN (HGB A1C): Hemoglobin A1C: 5.3

## 2015-05-15 MED ORDER — CLINDAMYCIN HCL 300 MG PO CAPS: 300.0000 mg | ORAL_CAPSULE | Freq: Four times a day (QID) | ORAL | Status: DC

## 2015-05-15 MED ORDER — TRAMADOL HCL 50 MG PO TABS: 50.0000 mg | ORAL_TABLET | Freq: Three times a day (TID) | ORAL | Status: DC | PRN

## 2015-05-15 NOTE — Assessment & Plan Note (Signed)
Well-controlled today Continue losartan 50 mg daily Continue to monitor creatinine annually as patient has CKD 2 Follow-up in 6 months

## 2015-05-15 NOTE — Patient Instructions (Signed)
Nice to see you again today. Continue taking your current medications for your blood pressure in your cholesterol. I will get some labs today and someone will call you or send you a letter with the results when they're available.  Take clindamycin 300 mg every 6 hours for the next week. You can take tramadol as needed for pain. Please call your dentist today and make an appointment to see them as soon as possible.   Take care,  Dr. Jacinto Reap

## 2015-05-15 NOTE — Assessment & Plan Note (Signed)
Exam concerning for possible beginning of abscess formation along right upper gumline Unlikely to be malignancy given short duration Clindamycin 300 mg 4 times a day for 7 days Tramadol when necessary for pain control Advised patient to call his dentist and be seen as soon as possible for further evaluation

## 2015-05-15 NOTE — Assessment & Plan Note (Signed)
Tobacco cessation counseling given today Patient working on cutting back on the number of cigarettes he smokes daily Patient not interested in medications to help him quit We'll continue to address

## 2015-05-15 NOTE — Progress Notes (Signed)
   Subjective:   [name] is a 64 y.o. male with a history of HTN, diastolic heart failure, hepatitis C, BPH, tobacco abuse here for HTN f/u, possible tooth abscess.  HTN - checks BP at home - usually 120/80s - taking losartan 50mg  daily - no medication SEs - no symptoms of hypotension, CP, SOB, HAs, vision changes, LE edema  HLD - taking lipitor 40mg  daily - no medication SEs - diet: eating more salads and vegetables, not eating many fried foods - starts back to jogging Monday  Tooth abscess - pain starting 2 days ago - has seen a small bump in R side of jaw, upper - last saw dentist 1 yr ago - nothing draining - has had tooth abscess in past and started like this - thinks he needs an antibiotic - no fevers  Review of Systems:  Per HPI. All other systems reviewed and are negative.   PMH, PSH, Medications, Allergies, and FmHx reviewed and updated in EMR.  Social History: current smoker - working on cutting back, only smoking about 3/day - not interested in pharmacologic agents to help him quit  Objective:  BP 127/90 mmHg  Pulse 67  Temp(Src) 98.2 F (36.8 C) (Oral)  Wt 172 lb (78.019 kg)  Gen:  64 y.o. male in NAD HEENT: NCAT, MMM, EOMI, Upper dentures in place, Along R upper gum line, 1cm nodule palpated, no discoloration or discharge, TTP CV: RRR, no MRG Resp: Non-labored, CTAB, no wheezes noted Ext: WWP, no edema Neuro: Alert and oriented, speech normal      Chemistry      Component Value Date/Time   NA 141 01/03/2015 1605   K 3.9 01/03/2015 1605   CL 104 01/03/2015 1605   CO2 29 01/03/2015 1605   BUN 15 01/03/2015 1605   CREATININE 1.42* 01/03/2015 1605   CREATININE 1.39* 09/09/2012 1634      Component Value Date/Time   CALCIUM 8.9 01/03/2015 1605   ALKPHOS 58 01/03/2015 1605   AST 21 01/03/2015 1605   ALT 10 01/03/2015 1605   BILITOT 0.6 01/03/2015 1605      Lab Results  Component Value Date   WBC 6.5 01/03/2015   HGB 13.8 01/03/2015   HCT 41.0 01/03/2015   MCV 88.9 01/03/2015   PLT 114* 01/03/2015   Lab Results  Component Value Date   TSH 1.412 01/03/2015   Lab Results  Component Value Date   HGBA1C 5.3 05/15/2015   Assessment & Plan:     [name] is a 64 y.o. male here for HTN, HLD, gum abscess, tobacco abuse counseling  HYPERTENSION, BENIGN ESSENTIAL Well-controlled today Continue losartan 50 mg daily Continue to monitor creatinine annually as patient has CKD 2 Follow-up in 6 months  Abscess of upper gum Exam concerning for possible beginning of abscess formation along right upper gumline Unlikely to be malignancy given short duration Clindamycin 300 mg 4 times a day for 7 days Tramadol when necessary for pain control Advised patient to call his dentist and be seen as soon as possible for further evaluation  Hyperlipidemia Lipid panel today Continue Lipitor Screening A1c today  Tobacco abuse counseling Tobacco cessation counseling given today Patient working on cutting back on the number of cigarettes he smokes daily Patient not interested in medications to help him quit We'll continue to address    [name], MD MPH PGY-2,  [facility] 05/15/2015  11:59 AM

## 2015-05-15 NOTE — Assessment & Plan Note (Signed)
Lipid panel today Continue Lipitor Screening A1c today

## 2015-05-17 ENCOUNTER — Encounter: Payer: Self-pay | Admitting: Family Medicine

## 2015-06-19 ENCOUNTER — Ambulatory Visit (INDEPENDENT_AMBULATORY_CARE_PROVIDER_SITE_OTHER): Payer: Medicaid Other | Admitting: Family Medicine

## 2015-06-19 ENCOUNTER — Encounter: Payer: Self-pay | Admitting: Family Medicine

## 2015-06-19 VITALS — BP 130/90 | HR 70 | Temp 98.6°F | Ht 69.0 in | Wt 174.0 lb

## 2015-06-19 DIAGNOSIS — Z716 Tobacco abuse counseling: Secondary | ICD-10-CM

## 2015-06-19 DIAGNOSIS — I10 Essential (primary) hypertension: Secondary | ICD-10-CM | POA: Diagnosis present

## 2015-06-19 DIAGNOSIS — E785 Hyperlipidemia, unspecified: Secondary | ICD-10-CM

## 2015-06-19 DIAGNOSIS — Z23 Encounter for immunization: Secondary | ICD-10-CM | POA: Diagnosis not present

## 2015-06-19 NOTE — Progress Notes (Signed)
   Subjective:   [name] is a 64 y.o. male with a history of HTN, diastolic heart failure, hepatitis C, BPH, tobacco abuse  here for HTN, HLD follow-up and lab results.  HTN - ran out of BP meds - picking up today - no medication side effects - denies any HAs, vision changes, CP, SOB, LE edema  HLD - taking lipitor - wanted to know results from last visit  Review of Systems:  Per HPI. All other systems reviewed and are negative.   PMH, PSH, Medications, Allergies, and FmHx reviewed and updated in EMR.  Social History: current smoker - 1 pack lasts 4-5 days. Planning quit date for1/1/17. Not interested in any medications currently but may be interested next month   Objective:  BP 130/90 mmHg  Pulse 70  Temp(Src) 98.6 F (37 C) (Oral)  Ht 5\' 9"  (1.753 m)  Wt 174 lb (78.926 kg)  BMI 25.68 kg/m2  SpO2 98%  Gen:  64 y.o. male in NAD HEENT: NCAT, MMM, EOMI, PERRL CV: RRR, no MRG, intact distal pulses Resp: Non-labored, CTAB, no wheezes noted Ext: WWP, no edema MSK: Moves all 4 extremities equally Neuro: Alert and oriented, speech normal      Chemistry      Component Value Date/Time   NA 141 01/03/2015 1605   K 3.9 01/03/2015 1605   CL 104 01/03/2015 1605   CO2 29 01/03/2015 1605   BUN 15 01/03/2015 1605   CREATININE 1.42* 01/03/2015 1605   CREATININE 1.39* 09/09/2012 1634      Component Value Date/Time   CALCIUM 8.9 01/03/2015 1605   ALKPHOS 58 01/03/2015 1605   AST 21 01/03/2015 1605   ALT 10 01/03/2015 1605   BILITOT 0.6 01/03/2015 1605      Lab Results  Component Value Date   WBC 6.5 01/03/2015   HGB 13.8 01/03/2015   HCT 41.0 01/03/2015   MCV 88.9 01/03/2015   PLT 114* 01/03/2015   Lab Results  Component Value Date   TSH 1.412 01/03/2015   Lab Results  Component Value Date   HGBA1C 5.3 05/15/2015   Assessment & Plan:     [name] is a 64 y.o. male here for HTN, HLD and tobacco abuse f/u  HYPERTENSION, BENIGN  ESSENTIAL Well-controlled Continue current medications Follow-up in 3 months  Hyperlipidemia Discussed last lipid panel Continue Lipitor  Tobacco abuse counseling Counseled on cessation He is planning a quit date of 08/18/15 He wants to come back next month to discuss medications that may help him quit       [name], MD MPH PGY-2,  [facility] 06/19/2015  10:17 AM

## 2015-06-19 NOTE — Assessment & Plan Note (Signed)
Counseled on cessation He is planning a quit date of 08/18/15 He wants to come back next month to discuss medications that may help him quit

## 2015-06-19 NOTE — Assessment & Plan Note (Signed)
Well-controlled Continue current medications Follow-up in 3 months

## 2015-06-19 NOTE — Patient Instructions (Signed)
Nice to see you again today.  Keep taking your current medications.  Come back t o see me next month to talk more about quittting smoking  Take care, Dr. Jacinto Reap

## 2015-06-19 NOTE — Assessment & Plan Note (Signed)
Discussed last lipid panel Continue Lipitor

## 2015-07-30 ENCOUNTER — Other Ambulatory Visit: Payer: Self-pay | Admitting: Family Medicine

## 2015-07-30 MED ORDER — LOSARTAN POTASSIUM 50 MG PO TABS: 50.0000 mg | ORAL_TABLET | Freq: Every day | ORAL | Status: DC

## 2015-07-30 NOTE — Telephone Encounter (Signed)
Needs refill on gray blood pressure pill. Doesn't know the name of them.   [facility] on [address]

## 2015-08-22 ENCOUNTER — Other Ambulatory Visit: Payer: Self-pay | Admitting: *Deleted

## 2015-08-22 DIAGNOSIS — N400 Enlarged prostate without lower urinary tract symptoms: Secondary | ICD-10-CM

## 2015-08-22 MED ORDER — TAMSULOSIN HCL 0.4 MG PO CAPS: 0.8000 mg | ORAL_CAPSULE | Freq: Every day | ORAL | Status: DC

## 2015-08-26 ENCOUNTER — Ambulatory Visit: Payer: Medicaid Other | Admitting: Family Medicine

## 2015-09-17 ENCOUNTER — Ambulatory Visit (INDEPENDENT_AMBULATORY_CARE_PROVIDER_SITE_OTHER): Payer: Medicaid Other | Admitting: Family Medicine

## 2015-09-17 ENCOUNTER — Encounter: Payer: Self-pay | Admitting: Family Medicine

## 2015-09-17 VITALS — BP 130/80 | HR 71 | Temp 98.4°F | Ht 69.0 in | Wt 174.3 lb

## 2015-09-17 DIAGNOSIS — M25562 Pain in left knee: Secondary | ICD-10-CM | POA: Diagnosis not present

## 2015-09-17 DIAGNOSIS — I10 Essential (primary) hypertension: Secondary | ICD-10-CM

## 2015-09-17 LAB — BASIC METABOLIC PANEL WITH GFR
BUN: 13 mg/dL (ref 7–25)
CO2: 29 mmol/L (ref 20–31)
Calcium: 9.3 mg/dL (ref 8.6–10.3)
Chloride: 106 mmol/L (ref 98–110)
Creat: 1.37 mg/dL — ABNORMAL HIGH (ref 0.70–1.25)
GFR, Est African American: 63 mL/min (ref >=60)
GFR, Est Non African American: 54 mL/min — ABNORMAL LOW (ref >=60)
Glucose, Bld: 68 mg/dL (ref 65–99)
Potassium: 4.1 mmol/L (ref 3.5–5.3)
Sodium: 144 mmol/L (ref 135–146)

## 2015-09-17 NOTE — Assessment & Plan Note (Signed)
History and exam consistent with patellar tendinitis of left knee With renal disease, discontinue NSAIDs Recommend Tylenol 3 times a day Given knee exercises If pain not resolved, consider referral to sports medicine

## 2015-09-17 NOTE — Assessment & Plan Note (Signed)
Well controlled on manual recheck Continue losartan 50 mg daily Check bmet today

## 2015-09-17 NOTE — Patient Instructions (Signed)
Nice to see you again today. Take Tylenol 3 times a day regularly for your knee pain. Do these exercises below twice a day for the next 2 weeks. If the pain is not getting better after 2 weeks, let me know and we might get to send you to sports medicine clinic.  See me again in 3 months for your blood pressure. We will get some blood work today and someone will call you or send you a letter with the results when they're available.  Take care, Dr. B  Patellar Tendinitis With Rehab Tendinitis is inflammation of a tendon. Tendonitis of the tendon below the kneecap (patella) is known as patellar tendonitis. Patellar tendonitis is also called jumper's knee. Jumper's knee is a common cause of pain below the kneecap (infrapatellar). Jumper's knee may involve a tear (strain) in the ligament. Strains are classified into three categories. Grade 1 strains cause pain, but the tendon is not lengthened. Grade 2 strains include a lengthened ligament, due to the ligament being stretched or partially ruptured. With grade 2 strains there is still function, although function may be decreased. Grade 3 strains involve a complete tear of the tendon or muscle, and function is usually impaired. Patellar tendon strains are usually grade 1 or 2.  SYMPTOMS   Pain, tenderness, swelling, warmth, or redness over the patellar tendon (just below the kneecap).  Pain and loss of strength (sometimes), with forcefully straightening the knee (especially when jumping or rising from a seated or squatting position), or bending the knee completely (squatting or kneeling).  Crackling sound (crepitation) when the tendon is moved or touched. CAUSES  Patellar tendonitis is caused by injury to the patellar tendon. The inflammation is the body's healing response. Common causes of injury include:  Stress from a sudden increase in intensity, frequency, or duration of training.  Overuse of the thigh muscles (quadriceps) and patellar  tendon.  Direct hit (trauma) to the knee or patellar tendon. RISK INCREASES WITH:  Sports that require sudden, explosive quadriceps contraction, such as jumping, quick starts, or kicking.  Running sports, especially running down hills.  Poor strength and flexibility of the thigh and knee.  Flat feet. PREVENTION  Warm up and stretch properly before activity.  Allow for adequate recovery between workouts.  Maintain physical fitness:  Strength, flexibility, and endurance.  Cardiovascular fitness.  Protect the knee joint with taping, protective strapping, bracing, or elastic compression bandage.  Wear arch supports (orthotics). PROGNOSIS  If treated properly, patellar tendonitis usually heals within 6 weeks.  RELATED COMPLICATIONS   Longer healing time if not properly treated or if not given enough time to heal.  Recurring symptoms if activity is resumed too soon, with overuse, with a direct blow, or when using poor technique.  If untreated, tendon rupture requiring surgery. TREATMENT Treatment first involves the use of ice and medicine to reduce pain and inflammation. The use of strengthening and stretching exercises may help reduce pain with activity. These exercises may be performed at home or with a therapist. Serious cases of tendonitis may require restraining the knee for 10 to 14 days to prevent stress on the tendon and to promote healing. Crutches may be used (uncommon) until you can walk without a limp. For cases in which nonsurgical treatment is unsuccessful, surgery may be advised to remove the inflamed tendon lining (sheath). Surgery is rare, and is only advised after at least 6 months of nonsurgical treatment. MEDICATION   If pain medicine is needed, nonsteroidal anti-inflammatory medicines (aspirin  and ibuprofen), or other minor pain relievers (acetaminophen), are often advised.  Do not take pain medicine for 7 days before surgery.  Prescription pain relievers  may be given if your caregiver thinks they are needed. Use only as directed and only as much as you need. HEAT AND COLD  Cold treatment (icing) should be applied for 10 to 15 minutes every 2 to 3 hours for inflammation and pain, and immediately after activity that aggravates your symptoms. Use ice packs or an ice massage.  Heat treatment may be used before performing stretching and strengthening activities prescribed by your caregiver, physical therapist, or athletic trainer. Use a heat pack or a warm water soak. SEEK MEDICAL CARE IF:  Symptoms get worse or do not improve in 2 weeks, despite treatment.  New, unexplained symptoms develop. (Drugs used in treatment may produce side effects.) EXERCISES RANGE OF MOTION (ROM) AND STRETCHING EXERCISES - Patellar Tendinitis (Jumper's Knee) These are some of the initial exercises with which you may start your rehabilitation program, until you see your caregiver again or until your symptoms are resolved. Remember:   Flexible tissue is more tolerant of the stresses placed on it during activities.  Each stretch should be held for 20 to 30 seconds.  A gentle stretching sensation should be felt. STRETCH - Hamstrings, Supine  Lie on your back. Loop a belt or towel over the ball of your right / left foot.  Straighten your right / left knee and slowly pull on the belt to raise your leg. Do not allow the right / left knee to bend. Keep your opposite leg flat on the floor.  Raise the leg until you feel a gentle stretch behind your right / left knee or thigh. Hold this position for __________ seconds. Repeat __________ times. Complete this stretch __________ times per day.  STRETCH - Hamstrings, Doorway  Lie on your back with your right / left leg extended and resting on the wall, and the opposite leg flat on the ground through the door. At first, position your bottom farther away from the wall.  Keep your right / left knee straight. If you feel a  stretch behind your knee or thigh, hold this position for __________ seconds.  If you do not feel a stretch, scoot your bottom closer to the door, and hold __________ seconds. Repeat __________ times. Complete this stretch __________ times per day.  STRETCH - Hamstrings, Standing  Stand or sit and extend your right / left leg, placing your foot on a chair or foot stool.  Keep a slight arch in your low back and your hips straight forward.  Lead with your chest and lean forward at the waist until you feel a gentle stretch in the back of your right / left knee or thigh. (When done correctly, this exercise requires leaning only a small distance.)  Hold this position for __________ seconds. Repeat __________ times. Complete this stretch __________ times per day. STRETCH - Adductors, Lunge  While standing, spread your legs, with your right / left leg behind you.  Lean away from your right / left leg by bending your opposite knee. You may rest your hands on your thigh for balance.  You should feel a stretch in your right / left inner thigh. Hold for __________ seconds. Repeat __________ times. Complete this exercise __________ times per day.  STRENGTHENING EXERCISES - Patellar Tendinitis (Jumper's Knee) These exercises may help you when beginning to rehabilitate your injury. They may resolve your symptoms with  or without further involvement from your physician, physical therapist or athletic trainer. While completing these exercises, remember:   Muscles can gain both the endurance and the strength needed for everyday activities through controlled exercises.  Complete these exercises as instructed by your physician, physical therapist or athletic trainer. Increase the resistance and repetitions only as guided by your caregiver. STRENGTH - Quadriceps, Isometrics  Lie on your back with your right / left leg extended and your opposite knee bent.  Gradually tense the muscles in the front of your  right / left thigh. You should see either your kneecap slide up toward your hip or increased dimpling just above the knee. This motion will push the back of the knee down toward the floor, mat, or bed on which you are lying.  Hold the muscle as tight as you can, without increasing your pain, for __________ seconds.  Relax the muscles slowly and completely in between each repetition. Repeat __________ times. Complete this exercise __________ times per day.  STRENGTH - Quadriceps, Short Arcs  Lie on your back. Place a __________ inch towel roll under your right / left knee, so that the knee bends slightly.  Raise only your lower leg by tightening the muscles in the front of your thigh. Do not allow your thigh to rise.  Hold this position for __________ seconds. Repeat __________ times. Complete this exercise __________ times per day.  OPTIONAL ANKLE WEIGHTS: Begin with ____________________, but DO NOT exceed ____________________. Increase in 1 pound/ 0.5 kilogram increments. STRENGTH - Quadriceps, Straight Leg Raises  Quality counts! Watch for signs that the quadriceps muscle is working, to be sure you are strengthening the correct muscles and not "cheating" by substituting with healthier muscles.  Lay on your back with your right / left leg extended and your opposite knee bent.  Tense the muscles in the front of your right / left thigh. You should see either your kneecap slide up or increased dimpling just above the knee. Your thigh may even shake a bit.  Tighten these muscles even more and raise your leg 4 to 6 inches off the floor. Hold for __________ seconds.  Keeping these muscles tense, lower your leg.  Relax the muscles slowly and completely between each repetition. Repeat __________ times. Complete this exercise __________ times per day.  STRENGTH - Quadriceps, Squats  Stand in a door frame so that your feet and knees are in line with the frame.  Use your hands for balance, not  support, on the frame.  Slowly lower your weight, bending at the hips and knees. Keep your lower legs upright so that they are parallel with the door frame. Squat only within the range that does not increase your knee pain. Never let your hips drop below your knees.  Slowly return upright, pushing with your legs, not pulling with your hands. Repeat __________ times. Complete this exercise __________ times per day.  STRENGTH - Quadriceps, Step-Downs  Stand on the edge of a step stool or stair. Be prepared to use a countertop or wall for balance, if needed.  Keeping your right / left knee directly over the middle of your foot, slowly touch your opposite heel to the floor or lower step. Do not go all the way to the floor if your knee pain increases; just go as far as you can without increased discomfort. Use your right / left leg muscles, not gravity to lower your body weight.  Slowly push your body weight back up to the  starting position. Repeat __________ times. Complete this exercise __________ times per day.    This information is not intended to replace advice given to you by your health care provider. Make sure you discuss any questions you have with your health care provider.   Document Released: 08/03/2005 Document Revised: 12/18/2014 Document Reviewed: 11/15/2008 Elsevier Interactive Patient Education [employer].

## 2015-09-17 NOTE — Progress Notes (Signed)
   Subjective:   [name] is a 65 y.o. male with a history of paranoid schizophrenia, tobacco abuse here for left knee pain and hypertension follow-up  L knee pain - hurt many years ago in [employer], and fell on [place] -awaiting disability paperwork from [employer] - pain is in front of knee of patellar tendon - pain for about 1.5 weeks - taking tylenol and advil OTC for pain control - helps a little - wonders if there is something else he should be doing - worse with stooping and bending - better with stretching and massaging - intermittent swelling  HTN: - Medications: losartan 50mg  daily - Compliance: good, took it this AM - Checking BP at home: no - Denies any SOB, CP, vision changes, LE edema, medication SEs, or symptoms of hypotension - Diet: tries to avoid high sodium foods - Exercise: had been jogging, but unable to recently with knee pain   Review of Systems:  Per HPI. All other systems reviewed and are negative.   PMH, PSH, Medications, Allergies, and FmHx reviewed and updated in EMR.  Social History: current smoker - 3 cig/day, trying to cut back and quit  Objective:  BP 130/80 mmHg  Pulse 71  Temp(Src) 98.4 F (36.9 C) (Oral)  Ht 5\' 9"  (1.753 m)  Wt 174 lb 4.8 oz (79.062 kg)  BMI 25.73 kg/m2  Gen:  65 y.o. male in NAD HEENT: NCAT, MMM, EOMI, PERRL, anicteric sclerae CV: RRR, no MRG Resp: Non-labored, CTAB, no wheezes noted Ext: WWP, no edema MSK: Full ROM, strength intact, L knee with TTP over patellar tendon, no swelling or effusion present, anterior and posterior drawers negative, normal varus and valgus stress testing Neuro: Alert and oriented, speech normal     Assessment & Plan:     [name] is a 65 y.o. male here for Patellar tendinitis and hypertension   HYPERTENSION, BENIGN ESSENTIAL Well controlled on manual recheck Continue losartan 50 mg daily Check bmet today  Left knee pain History and exam consistent with patellar  tendinitis of left knee With renal disease, discontinue NSAIDs Recommend Tylenol 3 times a day Given knee exercises If pain not resolved, consider referral to sports medicine    [name], MD MPH PGY-2,  [facility] 09/17/2015  3:48 PM

## 2015-09-18 ENCOUNTER — Encounter: Payer: Self-pay | Admitting: Family Medicine

## 2015-10-30 ENCOUNTER — Ambulatory Visit: Payer: Medicaid Other | Admitting: Family Medicine

## 2015-11-01 ENCOUNTER — Ambulatory Visit (INDEPENDENT_AMBULATORY_CARE_PROVIDER_SITE_OTHER): Payer: Medicaid Other | Admitting: Family Medicine

## 2015-11-01 VITALS — BP 130/90 | HR 60 | Temp 97.7°F | Ht 69.0 in | Wt 179.4 lb

## 2015-11-01 DIAGNOSIS — Z0000 Encounter for general adult medical examination without abnormal findings: Secondary | ICD-10-CM

## 2015-11-01 DIAGNOSIS — Z716 Tobacco abuse counseling: Secondary | ICD-10-CM | POA: Diagnosis not present

## 2015-11-01 DIAGNOSIS — I10 Essential (primary) hypertension: Secondary | ICD-10-CM

## 2015-11-01 MED ORDER — ZOSTER VACCINE LIVE 19400 UNT/0.65ML ~~LOC~~ SOLR: 0.6500 mL | Freq: Once | SUBCUTANEOUS | Status: DC

## 2015-11-01 NOTE — Patient Instructions (Signed)
Shingles Vaccine: What You Need to Know WHAT IS SHINGLES?  Shingles is a painful skin rash, often with blisters. It is also called Herpes Zoster or just Zoster.  A shingles rash usually appears on one side of the face or body and lasts from 2 to 4 weeks. Its main symptom is pain, which can be quite severe. Other symptoms of shingles can include fever, headache, chills, and upset stomach. Very rarely, a shingles infection can lead to pneumonia, hearing problems, blindness, brain inflammation (encephalitis), or death.  For about 1 person in 5, severe pain can continue even after the rash clears up. This is called post-herpetic neuralgia.  Shingles is caused by the Varicella Zoster virus. This is the same virus that causes chickenpox. Only someone who has had a case of chickenpox or rarely, has gotten chickenpox vaccine, can get shingles. The virus stays in your body. It can reappear many years later to cause a case of shingles.  You cannot catch shingles from another person with shingles. However, a person who has never had chickenpox (or chickenpox vaccine) could get chickenpox from someone with shingles. This is not very common.  Shingles is far more common in people 15 and older than in younger people. It is also more common in people whose immune systems are weakened because of a disease such as cancer or drugs such as steroids or chemotherapy.  At least 1 million people get shingles per year in the [place]. SHINGLES VACCINE  A vaccine for shingles was licensed in 8657. In clinical trials, the vaccine reduced the risk of shingles by 50%. It can also reduce the pain in people who still get shingles after being vaccinated.  A single dose of shingles vaccine is recommended for adults 56 years of age and older. SOME PEOPLE SHOULD NOT GET SHINGLES VACCINE OR SHOULD WAIT A person should not get shingles vaccine if he or she:  Has ever had a life-threatening allergic reaction to gelatin, the  antibiotic neomycin, or any other component of shingles vaccine. Tell your caregiver if you have any severe allergies.  Has a weakened immune system because of current:  AIDS or another disease that affects the immune system.  Treatment with drugs that affect the immune system, such as prolonged use of high-dose steroids.  Cancer treatment, such as radiation or chemotherapy.  Cancer affecting the bone marrow or lymphatic system, such as leukemia or lymphoma.  Is pregnant, or might be pregnant. Women should not become pregnant until at least 4 weeks after getting shingles vaccine. Someone with a minor illness, such as a cold, may be vaccinated. Anyone with a moderate or severe acute illness should usually wait until he or she recovers before getting the vaccine. This includes anyone with a temperature of 101.3 F (38 C) or higher. WHAT ARE THE RISKS FROM SHINGLES VACCINE?  A vaccine, like any medicine, could possibly cause serious problems, such as severe allergic reactions. However, the risk of a vaccine causing serious harm, or death, is extremely small.  No serious problems have been identified with shingles vaccine. Mild Problems  Redness, soreness, swelling, or itching at the site of the injection (about 1 person in 3).  Headache (about 1 person in 5). Like all vaccines, shingles vaccine is being closely monitored for unusual or severe problems. WHAT IF THERE IS A MODERATE OR SEVERE REACTION? What should I look for? Any unusual condition, such as a severe allergic reaction or a high fever. If a severe allergic reaction  problems.  WHAT IF THERE IS A MODERATE OR SEVERE REACTION?  What should I look for?  Any unusual condition, such as a severe allergic reaction or a high fever. If a severe allergic reaction occurred, it would be within a few minutes to an hour after the shot. Signs of a serious allergic reaction can include difficulty breathing, weakness, hoarseness or wheezing, a fast heartbeat, hives, dizziness, paleness, or swelling of the throat.  What should I do?  · Call your caregiver, or get the person to a caregiver right away.  · Tell the caregiver what  happened, the date and time it happened, and when the vaccination was given.  · Ask the caregiver to report the reaction by filing a Vaccine Adverse Event Reporting System (VAERS) form. Or, you can file this report through the VAERS web site at www.vaers.hhs.gov or by calling 1-800-822-7967.  VAERS does not provide medical advice.  HOW CAN I LEARN MORE?  · Ask your caregiver. He or she can give you the vaccine package insert or suggest other sources of information.  · Contact the Centers for Disease Control and Prevention (CDC):    Call 1-800-232-4636 (1-800-CDC-INFO).    Visit the CDC website at www.cdc.gov/vaccines  CDC Shingles Vaccine VIS (05/22/08)     This information is not intended to replace advice given to you by your health care provider. Make sure you discuss any questions you have with your health care provider.     Document Released: 05/31/2006 Document Revised: 12/18/2014 Document Reviewed: 11/23/2012  Elsevier Interactive Patient Education ©2016 Elsevier Inc.

## 2015-11-01 NOTE — Assessment & Plan Note (Signed)
Prescription for Zostavax given today Otherwise up-to-date

## 2015-11-01 NOTE — Assessment & Plan Note (Signed)
Encouraged patient to continue efforts to quit smoking and cutting back on number of cigarettes per day

## 2015-11-01 NOTE — Assessment & Plan Note (Signed)
Likely mildly elevated today as patient has not taken his medication yet today Recent BMP stable Continue losartan 50 mg daily Follow-up in 3 months

## 2015-11-01 NOTE — Progress Notes (Signed)
   Subjective:   [name] is a 65 y.o. male with a history of Paranoid schizophrenia, tobacco abuse, HTN here for follow-up of medical problems.  Reports that he is doing well Has a job interview today Reports that he has no complaints today  HTN: - Medications: losartan 50mg  daily - Compliance: good, but wasn't able to take it before appt this AM - Checking BP at home: no - Denies any SOB, CP, vision changes, LE edema, medication SEs, or symptoms of hypotension - Diet: eating a lot of greens, eating a lot of canned foods - Exercise: wants to start jogging again soon  Review of Systems:  Per HPI.   Social History: current smoker - has cut back to 3-4 cig/day  Objective:  BP 130/90 mmHg  Pulse 60  Temp(Src) 97.7 F (36.5 C) (Oral)  Ht 5\' 9"  (1.753 m)  Wt 179 lb 6.4 oz (81.375 kg)  BMI 26.48 kg/m2  Gen:  65 y.o. male in NAD HEENT: NCAT, MMM, EOMI, PERRL, anicteric sclerae CV: RRR, no MRG Resp: Non-labored, CTAB, no wheezes noted Ext: WWP, no edema MSK: No obvious deformities Neuro: Alert and oriented, speech normal    Assessment & Plan:     [name] is a 65 y.o. male here for HTN, tobacco abuse f/u  HYPERTENSION, BENIGN ESSENTIAL Likely mildly elevated today as patient has not taken his medication yet today Recent BMP stable Continue losartan 50 mg daily Follow-up in 3 months  Tobacco abuse counseling Encouraged patient to continue efforts to quit smoking and cutting back on number of cigarettes per day  Healthcare maintenance Prescription for Zostavax given today Otherwise up-to-date     [name], MD MPH PGY-2,  [facility] 11/01/2015  11:03 AM

## 2015-11-13 ENCOUNTER — Other Ambulatory Visit: Payer: Self-pay | Admitting: *Deleted

## 2015-11-13 DIAGNOSIS — E785 Hyperlipidemia, unspecified: Secondary | ICD-10-CM

## 2015-11-13 MED ORDER — ATORVASTATIN CALCIUM 40 MG PO TABS: 40.0000 mg | ORAL_TABLET | Freq: Every day | ORAL | Status: DC

## 2016-01-16 ENCOUNTER — Other Ambulatory Visit: Payer: Self-pay | Admitting: Nurse Practitioner

## 2016-01-16 DIAGNOSIS — K7469 Other cirrhosis of liver: Secondary | ICD-10-CM

## 2016-01-17 ENCOUNTER — Ambulatory Visit (INDEPENDENT_AMBULATORY_CARE_PROVIDER_SITE_OTHER): Payer: Medicaid Other | Admitting: Family Medicine

## 2016-01-17 ENCOUNTER — Encounter: Payer: Self-pay | Admitting: Family Medicine

## 2016-01-17 VITALS — BP 144/91 | HR 70 | Temp 97.6°F | Ht 69.0 in | Wt 178.0 lb

## 2016-01-17 DIAGNOSIS — B351 Tinea unguium: Secondary | ICD-10-CM

## 2016-01-17 DIAGNOSIS — Z716 Tobacco abuse counseling: Secondary | ICD-10-CM

## 2016-01-17 DIAGNOSIS — I10 Essential (primary) hypertension: Secondary | ICD-10-CM

## 2016-01-17 NOTE — Assessment & Plan Note (Signed)
Blood pressure again mildly elevated today Likely related to patient not taking his medications yet today Continue losartan 50 mg daily Follow-up in one month in the afternoon after patient is taking his medications for the day

## 2016-01-17 NOTE — Assessment & Plan Note (Signed)
Exam consistent with onychomycosis Given hepatitis C, will not start oral Lamisil treatment at this time Referral to podiatry for further management

## 2016-01-17 NOTE — Assessment & Plan Note (Signed)
Again advised patient about quitting smoking Patient has been contemplative for some time without any reaction Continue to follow

## 2016-01-17 NOTE — Patient Instructions (Signed)

## 2016-01-17 NOTE — Progress Notes (Signed)
   Subjective:   [name] is a 65 y.o. male with a history of HTN, diastolic heart failure, hepatitis C, paranoid schizophrenia here for blood pressure follow-up  HTN: - Medications: losartan 50mg  daily - has not taken yet today - Compliance: good - Checking BP at home: no - Denies any SOB, CP, vision changes, LE edema, medication SEs, or symptoms of hypotension - Diet: tries to avoid salt - Exercise:  thinking about starting jogging on Monday - wants to make sure it is safe  Unable to cut toenails - would like to see podiatry - has thick, yellow toenails - been that way for a long time  Review of Systems:  Per HPI.   Social History: Current smoker - continues to state he is thinking about quitting soon, but not at this time  Objective:  BP 144/91 mmHg  Pulse 70  Temp(Src) 97.6 F (36.4 C) (Oral)  Ht 5\' 9"  (1.753 m)  Wt 178 lb (80.74 kg)  BMI 26.27 kg/m2  Gen:  65 y.o. male in NAD HEENT: NCAT, MMM, EOMI, PERRL, anicteric sclerae CV: RRR, no MRG Resp: Non-labored, CTAB, no wheezes noted Ext: WWP, no edema MSK: All toenails of bilateral feet thickened with yellow discoloration in curling of her toes Neuro: Alert and oriented, speech normal     Assessment & Plan:     [name] is a 65 y.o. male here for   HYPERTENSION, BENIGN ESSENTIAL Blood pressure again mildly elevated today Likely related to patient not taking his medications yet today Continue losartan 50 mg daily Follow-up in one month in the afternoon after patient is taking his medications for the day  Onychomycosis Exam consistent with onychomycosis Given hepatitis C, will not start oral Lamisil treatment at this time Referral to podiatry for further management  Tobacco abuse counseling Again advised patient about quitting smoking Patient has been contemplative for some time without any reaction Continue to follow   [name], MD MPH PGY-2,  [facility] 01/17/2016   11:32 AM

## 2016-01-23 ENCOUNTER — Ambulatory Visit
Admission: RE | Admit: 2016-01-23 | Discharge: 2016-01-23 | Disposition: A | Discharge status: Home / Self Care | Payer: Medicaid Other | Source: Ambulatory Visit | Attending: Nurse Practitioner | Admitting: Nurse Practitioner

## 2016-01-23 DIAGNOSIS — K7469 Other cirrhosis of liver: Secondary | ICD-10-CM

## 2016-02-11 ENCOUNTER — Ambulatory Visit: Payer: Medicaid Other | Admitting: Family Medicine

## 2016-02-26 ENCOUNTER — Ambulatory Visit (INDEPENDENT_AMBULATORY_CARE_PROVIDER_SITE_OTHER): Payer: Medicaid Other | Admitting: Podiatry

## 2016-02-26 ENCOUNTER — Encounter: Payer: Self-pay | Admitting: Podiatry

## 2016-02-26 VITALS — BP 133/74 | HR 64 | Resp 12

## 2016-02-26 DIAGNOSIS — M79675 Pain in left toe(s): Secondary | ICD-10-CM

## 2016-02-26 DIAGNOSIS — M79674 Pain in right toe(s): Secondary | ICD-10-CM | POA: Diagnosis not present

## 2016-02-26 DIAGNOSIS — B351 Tinea unguium: Secondary | ICD-10-CM | POA: Diagnosis not present

## 2016-02-26 DIAGNOSIS — L84 Corns and callosities: Secondary | ICD-10-CM | POA: Diagnosis not present

## 2016-02-26 DIAGNOSIS — L602 Onychogryphosis: Secondary | ICD-10-CM | POA: Diagnosis not present

## 2016-02-26 NOTE — Progress Notes (Signed)
   Subjective:    Patient ID: [name], male    DOB: Apr 30, 1951, 65 y.o.   MRN: [id number]  HPI   This patient presents today complaining of a painful skin lesion on the lateral right heel for approximately 55 months is uncomfortable walking and wearing shoes any self treats this lesion with trimming with a razor blade. Denies any professional care for this problem. Also, patient is complaining of elongated and thickened toenails which are uncomfortable walking wearing shoes over the plantar past 5-6 months and is requesting toenail debridement. He has self treated the nails himself, however, the nails of become progressively elongated and deformed and is unable to trim the nails himself.  Review of Systems  Skin: Positive for color change.       Objective:   Physical Exam  Patient appears responsive and orientated 3  DP and PT pulses 2/4 bilaterally Capillary reflex immediate bilaterally  Neurological: Sensation to 10 g monofilament wire intact 5/5 bilaterally Vibratory sensation reactive bilaterally Ankle reflex equal reactive bilaterally  Dermatological: No skin lesions bilaterally Callus lateral right heel The toenails are elongated, incurvated, hypertrophic, discolored and tender to palpation 6-10  Musculoskeletal: Stable gait bilaterally There is no restriction ankle, subtalar, midtarsal joints bilaterally      Assessment & Plan:   Assessment: Hepatitis C Satisfactory neurovascular status  callus 1 right heel Symptomatic mycotic toenails 6-10  Plan: I reviewed the results of exam with patient today make him aware that the callus is a chronic problem that would require repetitive debridement. Also, we discussed treatment of elongated toenails I suggested periodic debridement. He verbally consents Callus 1 debrided without any bleeding Toenails 6-10 debrided mechanically and electronically without any bleeding  Reappoint 3 months

## 2016-02-28 ENCOUNTER — Ambulatory Visit (INDEPENDENT_AMBULATORY_CARE_PROVIDER_SITE_OTHER): Payer: Medicaid Other | Admitting: Family Medicine

## 2016-02-28 VITALS — BP 134/80 | HR 69 | Temp 97.9°F | Wt 176.0 lb

## 2016-02-28 DIAGNOSIS — Z716 Tobacco abuse counseling: Secondary | ICD-10-CM | POA: Diagnosis not present

## 2016-02-28 DIAGNOSIS — I10 Essential (primary) hypertension: Secondary | ICD-10-CM | POA: Diagnosis not present

## 2016-02-28 DIAGNOSIS — K068 Other specified disorders of gingiva and edentulous alveolar ridge: Secondary | ICD-10-CM | POA: Diagnosis present

## 2016-02-28 NOTE — Patient Instructions (Signed)
Nice to see you again today. Your blood pressure is good. Continue current medications. You need to follow-up with a dentist as soon as possible. Please call the ones on the list make an appointment within the next week. They'll be able to evaluate your gums better than we can.  Quitting smoking will help decrease your risk of getting gum infections in the future.  Take care, Dr. B  Periodontal Disease Periodontal disease, or gum disease, is a type of oral disease that affects the surrounding and supporting tissues of the teeth. These include the gums (gingivae), ligaments, and tooth socket (alveolar bone). Periodontal disease can affect one tooth or many teeth. If left untreated, it may lead to tooth loss.  CAUSES The main cause of periodontal disease is dental plaque, which contains harmful bacteria. These bacteria can cause the gums to become inflamed and infected. Further progression of the disease can damage the other supporting tissues.  RISK FACTORS  Diabetes.   Smoking and tobacco use.   Genetics.   Hormonal changes of puberty, menopause, and pregnancy.   Stress.   Clenching or grinding your teeth.   Substance abuse.  Poor nutrition.   Diseases that interfere with the body's immune system.   Certain medicines. SIGNS AND SYMPTOMS  Red or swollen gums.  Bad breath that does not go away.  Gums that have pulled away from the teeth.  Gums that bleed easily.  Permanent teeth that are loose or separating.  Pain when chewing.  Changes in the way your teeth fit together.  Sensitive teeth. DIAGNOSIS  A thorough examination of the periodontal tissues will be done by your dentist. X-rays may be needed. Evaluation of your medical history will be needed to see if there are other factors or underlying conditions that may contribute to the disease. TREATMENT The number and types of treatment will vary depending on the extent of the disease. Treatment may include  brushing and flossing only. Further disease progression may necessitate scaling and root planing or even surgery. The main goal is to control the infection. Good oral hygiene at home is necessary for the success of all types of treatment. HOME CARE INSTRUCTIONS   Practice good oral hygiene. This includes flossing and brushing your teeth every day.   See your dentist regularly, at least 2 times per year.   Stop smoking if you smoke.  Eat a well-balanced diet. SEEK IMMEDIATE DENTAL CARE IF:   You have any signs or symptoms of periodontal disease along with:  Swelling of your face, neck, or jaw.  Inability to open your mouth.  Severe pain uncontrolled by pain medicine.  You have a fever or persistent symptoms for more than 2-3 days.  You have a fever and your symptoms suddenly get worse.   This information is not intended to replace advice given to you by your health care provider. Make sure you discuss any questions you have with your health care provider.   Document Released: 08/06/2003 Document Revised: 04/05/2013 Document Reviewed: 01/10/2013 Elsevier Interactive Patient Education [employer].

## 2016-02-28 NOTE — Assessment & Plan Note (Signed)
Well-controlled Continue current medications Follow-up in 3 months

## 2016-02-28 NOTE — Progress Notes (Signed)
   Subjective:   [name] is a 65 y.o. male with a history of Hepatitis C, paranoid schizophrenia, tobacco abuse, HTN, HLD here for same day appt for gum pain  Gum pain - worsening over 2 months - saw dentist 5 months ago and everything was normal - havign headaches - associates with gum pain - no fevers - very tender under dentures - swollen on top gum line - no known drainage - Hurts with brushing  HTN: - Medications: Losartan 50 mg daily - Compliance: Good - Checking BP at home: No - Denies any SOB, CP, vision changes, LE edema, medication SEs, or symptoms of hypotension - Diet: Stable, avoiding salty foods and junk foods - Exercise: Walking daily   Review of Systems:  Per HPI.   Social History: Current smoker - smoking 2 cigarettes per day, trying to cut back  Objective:  BP 134/80 mmHg  Pulse 69  Temp(Src) 97.9 F (36.6 C) (Oral)  Wt 176 lb (79.833 kg)  Gen:  65 y.o. male in NAD HEENT: NCAT, MMM, EOMI, PERRL, anicteric sclerae, small papules along upper gum line under dentures that are TTP, no fluctuance or purulent drainage CV: RRR, no MRG Resp: Non-labored, CTAB, no wheezes noted Ext: WWP, no edema MSK: No obvious deformities, gait intact Neuro: Alert and oriented, speech normal      Chemistry      Component Value Date/Time   NA 144 09/17/2015 1410   K 4.1 09/17/2015 1410   CL 106 09/17/2015 1410   CO2 29 09/17/2015 1410   BUN 13 09/17/2015 1410   CREATININE 1.37* 09/17/2015 1410   CREATININE 1.39* 09/09/2012 1634      Component Value Date/Time   CALCIUM 9.3 09/17/2015 1410   ALKPHOS 58 01/03/2015 1605   AST 21 01/03/2015 1605   ALT 10 01/03/2015 1605   BILITOT 0.6 01/03/2015 1605      Lab Results  Component Value Date   WBC 6.5 01/03/2015   HGB 13.8 01/03/2015   HCT 41.0 01/03/2015   MCV 88.9 01/03/2015   PLT 114* 01/03/2015   Lab Results  Component Value Date   TSH 1.412 01/03/2015   Lab Results  Component Value Date   HGBA1C  5.3 05/15/2015   Assessment & Plan:     [name] is a 65 y.o. male here for   HYPERTENSION, BENIGN ESSENTIAL Well-controlled Continue current medications Follow-up in 3 months  Tobacco abuse counseling Advised again on tobacco cessation This will help with gum problems and tooth decay as well  Pain in gums No obvious infection or abscess at this time Could be related to dentures being ill fitting and rubbing gums improperly Advised close follow-up with dentist -list of Medicaid dentists given to patient No indication for antibiotic at this time recommended tobacco cessation         [name], MD MPH PGY-3,  [facility] 02/28/2016  2:52 PM

## 2016-02-28 NOTE — Assessment & Plan Note (Signed)
No obvious infection or abscess at this time Could be related to dentures being ill fitting and rubbing gums improperly Advised close follow-up with dentist -list of Medicaid dentists given to patient No indication for antibiotic at this time recommended tobacco cessation

## 2016-02-28 NOTE — Assessment & Plan Note (Signed)
Advised again on tobacco cessation This will help with gum problems and tooth decay as well

## 2016-03-20 ENCOUNTER — Ambulatory Visit: Payer: Medicaid Other | Admitting: Student

## 2016-03-30 ENCOUNTER — Other Ambulatory Visit: Payer: Self-pay | Admitting: Family Medicine

## 2016-03-31 NOTE — Telephone Encounter (Signed)
Medication refilled

## 2016-04-01 ENCOUNTER — Telehealth: Payer: Self-pay | Admitting: *Deleted

## 2016-04-01 NOTE — Telephone Encounter (Signed)
Prior Authorization received from [facility] for Losartan 50 mg on 04/01/16. Fax original came through on 03/31/16, but clinic fax machine was broken.   PA is pending per  [employer].  Derl Barrow, RN

## 2016-04-02 NOTE — Telephone Encounter (Signed)
Received PA approval for Losartan 50 mg via [place] [employer].  Med approved for 04/01/16 - 04/01/17.  [facility] pharmacy informed.  PA approval number [id number]. Derl Barrow, RN

## 2016-04-09 ENCOUNTER — Ambulatory Visit: Payer: Medicaid Other | Admitting: Gastroenterology

## 2016-05-06 ENCOUNTER — Encounter: Payer: Self-pay | Admitting: Family Medicine

## 2016-05-06 ENCOUNTER — Ambulatory Visit (INDEPENDENT_AMBULATORY_CARE_PROVIDER_SITE_OTHER): Payer: Medicaid Other | Admitting: Family Medicine

## 2016-05-06 VITALS — BP 130/82 | HR 80 | Temp 98.6°F | Ht 69.0 in | Wt 181.0 lb

## 2016-05-06 DIAGNOSIS — Z23 Encounter for immunization: Secondary | ICD-10-CM | POA: Diagnosis not present

## 2016-05-06 DIAGNOSIS — M7652 Patellar tendinitis, left knee: Secondary | ICD-10-CM

## 2016-05-06 DIAGNOSIS — K068 Other specified disorders of gingiva and edentulous alveolar ridge: Secondary | ICD-10-CM

## 2016-05-06 MED ORDER — DICLOFENAC SODIUM 1 % TD GEL: TRANSDERMAL | 2 refills | Status: DC

## 2016-05-06 NOTE — Patient Instructions (Addendum)
Nice to see you again today. You can take Tylenol as needed for your knee pain. Please avoid ibuprofen, Advil, Aleve and other such medicines. You can use Voltaren gel on the spot that hurts up to 4 times a day.  He can also look into buying a patellar strap to wear when your active for the knee pain.  I'm giving you a list of dentists. Please call and schedule an appointment for your gum pain. It does not look infected at this point.  Take care,  Dr. Jacinto Reap

## 2016-05-06 NOTE — Progress Notes (Signed)
   Subjective:   [name] is a 65 y.o. male with a history of HTN, hepatitis C, HLD, tobacco abuse here for knee pain  L knee pain - pain described as constant achy - swollen intermittently - worse with cold weather, walking a lot - better with warmth - has tried nothing - pain present for ~1wk -previously diagnosed with patellar tendonitis in 08/2015  Gum pain  - thinks he may have infection - worsening over 2-3 months - saw dentist ~6 months ago and everything was normal - lost information on dentists from last visit - no fevers - very tender under dentures - swollen on top gum line - no known drainage  Review of Systems:  Per HPI.   Social History: current smoker - 2 cig/day, cutting back  Objective:  BP 130/82   Pulse 80   Temp 98.6 F (37 C) (Oral)   Ht 5\' 9"  (1.753 m)   Wt 181 lb (82.1 kg)   SpO2 98%   BMI 26.73 kg/m   Gen:  65 y.o. male in NAD  HEENT: NCAT, MMM, EOMI, PERRL, anicteric sclerae, small bump on anterior upper gum line, no fluctuance or drainage CV: RRR, no MRG Resp: Non-labored, CTAB, no wheezes noted Ext: WWP, no edema MSK: L Knee: Normal to inspection with no erythema or effusion or obvious bony abnormalities. Palpation normal with no warmth or joint line tenderness or patellar tenderness or condyle tenderness. TTP over patellar tendon ROM normal in flexion and extension and lower leg rotation. Ligaments with solid consistent endpoints including ACL, PCL, LCL, MCL. Negative Mcmurray's and provocative meniscal tests. Non painful patellar compression. quadriceps tendon unremarkable. Hamstring and quadriceps strength is normal. Neuro: Alert and oriented, speech normal    Assessment & Plan:     [name] is a 65 y.o. male here for   Pain in gums No obvious infection or abscess at this time Could be related to dentures being ill fitting and rubbing gums improperly Advised close follow-up with dentist List of Medicaid dentists  given to patient No indication for antibiotic at this time Recommended tobacco cessation  Patellar tendonitis History and exam consistent with patellar tendinitis of the left knee This is likely a more chronic intermittent tendinopathy With renal disease, avoid NSAIDs Voltaren gel when necessary Tylenol when necessary Follow-up as needed   [name], MD MPH PGY-3,  [facility] 05/06/2016  3:14 PM

## 2016-05-06 NOTE — Assessment & Plan Note (Signed)
No obvious infection or abscess at this time Could be related to dentures being ill fitting and rubbing gums improperly Advised close follow-up with dentist List of Medicaid dentists given to patient No indication for antibiotic at this time Recommended tobacco cessation

## 2016-05-06 NOTE — Assessment & Plan Note (Signed)
History and exam consistent with patellar tendinitis of the left knee This is likely a more chronic intermittent tendinopathy With renal disease, avoid NSAIDs Voltaren gel when necessary Tylenol when necessary Follow-up as needed

## 2016-05-27 ENCOUNTER — Ambulatory Visit (INDEPENDENT_AMBULATORY_CARE_PROVIDER_SITE_OTHER): Payer: Medicaid Other | Admitting: Podiatry

## 2016-05-27 NOTE — Progress Notes (Signed)
ERRONEOUS ENCOUNTER NO SHOW  

## 2016-07-15 ENCOUNTER — Encounter (HOSPITAL_COMMUNITY): Payer: Self-pay | Admitting: *Deleted

## 2016-07-15 ENCOUNTER — Emergency Department (HOSPITAL_COMMUNITY): Payer: Medicaid Other

## 2016-07-15 ENCOUNTER — Emergency Department (HOSPITAL_COMMUNITY)
Admission: EM | Admit: 2016-07-15 | Discharge: 2016-07-15 | Disposition: A | Discharge status: Home / Self Care | Payer: Medicaid Other | Attending: Emergency Medicine | Admitting: Emergency Medicine

## 2016-07-15 DIAGNOSIS — F17210 Nicotine dependence, cigarettes, uncomplicated: Secondary | ICD-10-CM | POA: Diagnosis not present

## 2016-07-15 DIAGNOSIS — Y929 Unspecified place or not applicable: Secondary | ICD-10-CM | POA: Insufficient documentation

## 2016-07-15 DIAGNOSIS — I10 Essential (primary) hypertension: Secondary | ICD-10-CM | POA: Diagnosis not present

## 2016-07-15 DIAGNOSIS — Z7982 Long term (current) use of aspirin: Secondary | ICD-10-CM | POA: Diagnosis not present

## 2016-07-15 DIAGNOSIS — M25562 Pain in left knee: Secondary | ICD-10-CM | POA: Diagnosis not present

## 2016-07-15 DIAGNOSIS — X501XXA Overexertion from prolonged static or awkward postures, initial encounter: Secondary | ICD-10-CM | POA: Insufficient documentation

## 2016-07-15 DIAGNOSIS — Y9301 Activity, walking, marching and hiking: Secondary | ICD-10-CM | POA: Insufficient documentation

## 2016-07-15 MED ORDER — TRAMADOL HCL 50 MG PO TABS: 50.0000 mg | ORAL_TABLET | Freq: Four times a day (QID) | ORAL | 0 refills | Status: DC | PRN

## 2016-07-15 NOTE — ED Notes (Signed)
Pt called x 2 with no answer  

## 2016-07-15 NOTE — ED Triage Notes (Addendum)
Pt c/o L knee pain and swelling x 4 days. Reports twisting his knee when walking but did not fall. Pt has previous injury to knee

## 2016-07-15 NOTE — Discharge Instructions (Signed)
Use the Voltaren Gel that you have in addition to the medication we give you and follow up with Dr. Theda Sers

## 2016-07-15 NOTE — ED Provider Notes (Signed)
[facility] DEPT Provider Note    By signing my name below, I, Bea Graff, attest that this documentation has been prepared under the direction and in the presence of [facility], [place]. Electronically Signed: Bea Graff, ED Scribe. 07/15/16. 10:09 PM.    History   Chief Complaint Chief Complaint  Patient presents with  . Knee Pain    The history is provided by the patient and medical records. No language interpreter was used.    HPI Comments:  [name] is a 65 y.o. male who presents to the Emergency Department complaining of left knee pain secondary to twisting it while walking four days ago. He reports associated swelling. He has not taken anything for pain. Moving the knee increases the pain. He denies alleviating factors. He denies numbness, tingling or weakness of the LLE, bruising, wounds. He reports hurting the same knee several years ago. His orthopedist is Dr. Hart Robinsons.   Past Medical History:  Diagnosis Date  . BPH (benign prostatic hyperplasia)   . Colon polyp 2010  . Depression   . Hepatitis C   . Hypertension   . Prostate atrophy   . Retinal vein occlusion   . Schizophrenia [facility])     Patient Active Problem List   Diagnosis Date Noted  . Pain in gums 02/28/2016  . Onychomycosis 01/17/2016  . Diastolic heart failure ([place]) 02/05/2015  . Rash and nonspecific skin eruption 01/03/2015  . Healthcare maintenance 05/28/2014  . Hyperlipidemia 03/10/2014  . Neck pain 05/12/2013  . Patellar tendonitis 11/16/2012  . [place] OCCLUSION 06/03/2010  . PARANOID SCHIZOPHRENIA, CHRONIC 01/17/2009  . Tobacco abuse counseling 01/17/2009  . HYPERTENSION, BENIGN ESSENTIAL 01/17/2009  . BPH (benign prostatic hyperplasia) 01/17/2009  . HEPATITIS C 12/14/2008  . DEPRESSION 12/14/2008    Past Surgical History:  Procedure Laterality Date  . CIRCUMCISION  1959       Home Medications    Prior to Admission medications   Medication Sig  Start Date End Date Taking? Authorizing Provider  aspirin 81 MG tablet Take 1 tablet (81 mg total) by mouth daily. 09/24/14   [name], MD  atorvastatin (LIPITOR) 40 MG tablet Take 1 tablet (40 mg total) by mouth daily. 11/13/15   [name], MD  benztropine (COGENTIN) 1 MG tablet Take 1 tablet (1 mg total) by mouth 2 (two) times daily. 06/03/12   Melony Overly, MD  cetirizine (ZYRTEC) 10 MG tablet take 1 tablet by mouth once daily 02/20/15   [name], MD  diclofenac sodium (VOLTAREN) 1 % GEL Apply to L knee QID prn pain 05/06/16   [name], MD  fluPHENAZine decanoate (PROLIXIN) 25 MG/ML injection Inject 25 mg into the muscle every 14 (fourteen) days. Last dose 09/07/12    Historical Provider, MD  Ledipasvir-Sofosbuvir (HARVONI PO) Take by mouth.    Historical Provider, MD  losartan (COZAAR) 50 MG tablet Take 1 tablet (50 mg total) by mouth daily. 03/31/16   Sela Hua, MD  Multiple Vitamin (MULTIVITAMIN WITH MINERALS) TABS tablet Take 1 tablet by mouth daily. 06/28/14   [name], MD  tamsulosin (FLOMAX) 0.4 MG CAPS capsule Take 2 capsules (0.8 mg total) by mouth daily. 08/22/15   [name], MD  traMADol (ULTRAM) 50 MG tablet Take 1 tablet (50 mg total) by mouth every 6 (six) hours as needed. 07/15/16   Vyla Pint Bunnie Pion, NP  triamcinolone cream (KENALOG) 0.1 % Apply 1 application topically 2 (  two) times daily. For 5 days, then twice daily as needed for rash and itching. 01/03/15   [name], MD  vitamin C (ASCORBIC ACID) 500 MG tablet Take 500 mg by mouth daily.    Historical Provider, MD  zoster vaccine live, PF, (ZOSTAVAX) 57846 UNT/0.65ML injection Inject 19,400 Units into the skin once. 11/01/15   [name], MD    Family History Family History  Problem Relation Age of Onset  . Hypertension Mother   . Diabetes Mother     Social History Social History  Substance Use Topics  . Smoking status: Current Every Day Smoker     Types: Cigarettes  . Smokeless tobacco: Never Used     Comment: pt reports only smoking 2 cigs a day  . Alcohol use No     Allergies   Lisinopril and Penicillins   Review of Systems Review of Systems  Musculoskeletal: Positive for arthralgias and joint swelling.  Skin: Negative for color change and wound.  Neurological: Negative for weakness and numbness.  All other systems reviewed and are negative.    Physical Exam Updated Vital Signs BP 172/97 (BP Location: Left Arm)   Pulse 73   Temp 97.7 F (36.5 C) (Oral)   Resp 18   Ht 5\' 9"  (1.753 m)   Wt 175 lb (79.4 kg)   SpO2 100%   BMI 25.84 kg/m   Physical Exam  Constitutional: He is oriented to person, place, and time. He appears well-developed and well-nourished.  HENT:  Head: Normocephalic.  Eyes: EOM are normal.  Neck: Neck supple.  Cardiovascular: Normal rate.   Pulmonary/Chest: Effort normal.  Musculoskeletal:       Left knee: He exhibits swelling. He exhibits no deformity, no erythema, normal alignment and normal patellar mobility. Tenderness found.  Full passive range of motion with pain when the knee is flexed. Pedal pulse 2+, adequate circulation.   Neurological: He is alert and oriented to person, place, and time. No cranial nerve deficit.  Skin: Skin is warm and dry.  Psychiatric: He has a normal mood and affect.  Nursing note and vitals reviewed.    ED Treatments / Results  DIAGNOSTIC STUDIES: Oxygen Saturation is 100% on RA, normal by my interpretation.   COORDINATION OF CARE: 10:08 PM- Will order knee sleeve and prescribe pain medication. Advised pt to follow up with his orthopedist. Pt verbalizes understanding and agrees to plan.  Medications - No data to display  Labs (all labs ordered are listed, but only abnormal results are displayed) Labs Reviewed - No data to display  Radiology Dg Knee Complete 4 Views Left  Result Date: 07/15/2016 CLINICAL DATA:  Generalized left knee pain and  swelling x4 days. EXAM: LEFT KNEE - COMPLETE 4+ VIEW COMPARISON:  11/14/2012 FINDINGS: No evidence of fracture, dislocation, or joint effusion. There is spurring off the upper pole of the patella as before. Small ossific density along the distal patellar tendon is stable and may represent sequela of remote patellar tendon injury. No evidence of arthropathy or other focal bone abnormality. Soft tissues are unremarkable. IMPRESSION: No acute osseous abnormality of the left knee.  No joint effusion. Electronically Signed   By: Ashley Royalty M.D.   On: 07/15/2016 20:49    Procedures Procedures (including critical care time)  Medications Ordered in ED Medications - No data to display   Initial Impression / Assessment and Plan / ED Course  I have reviewed the triage vital signs and the nursing notes.  Pertinent imaging results that were available during my care of the patient were reviewed by me and considered in my medical decision making (see chart for details).  Clinical Course     Patient X-Ray negative for obvious fracture or dislocation.  Pt advised to follow up with orthopedics if symptoms persist. Patient given knee sleeve while in ED, conservative therapy recommended and discussed. Patient will be discharged home & is agreeable with above plan. Returns precautions discussed. Pt appears safe for discharge.   I personally performed the services described in this documentation, which was scribed in my presence. The recorded information has been reviewed and is accurate.   Final Clinical Impressions(s) / ED Diagnoses   Final diagnoses:  Left knee pain, unspecified chronicity    New Prescriptions Discharge Medication List as of 07/15/2016 10:14 PM    START taking these medications   Details  traMADol (ULTRAM) 50 MG tablet Take 1 tablet (50 mg total) by mouth every 6 (six) hours as needed., Starting Wed 07/15/2016, [place], NP 07/17/16 1552    Carmin Muskrat,  MD 07/20/16 0030

## 2016-08-05 ENCOUNTER — Emergency Department (HOSPITAL_COMMUNITY)
Admission: EM | Admit: 2016-08-05 | Discharge: 2016-08-05 | Disposition: A | Discharge status: Home / Self Care | Payer: Medicaid Other | Attending: Emergency Medicine | Admitting: Emergency Medicine

## 2016-08-05 ENCOUNTER — Encounter (HOSPITAL_COMMUNITY): Payer: Self-pay | Admitting: Emergency Medicine

## 2016-08-05 DIAGNOSIS — I5030 Unspecified diastolic (congestive) heart failure: Secondary | ICD-10-CM | POA: Insufficient documentation

## 2016-08-05 DIAGNOSIS — Z7982 Long term (current) use of aspirin: Secondary | ICD-10-CM | POA: Diagnosis not present

## 2016-08-05 DIAGNOSIS — J069 Acute upper respiratory infection, unspecified: Secondary | ICD-10-CM

## 2016-08-05 DIAGNOSIS — I110 Hypertensive heart disease with heart failure: Secondary | ICD-10-CM | POA: Diagnosis not present

## 2016-08-05 DIAGNOSIS — F17210 Nicotine dependence, cigarettes, uncomplicated: Secondary | ICD-10-CM | POA: Diagnosis not present

## 2016-08-05 DIAGNOSIS — Z720 Tobacco use: Secondary | ICD-10-CM

## 2016-08-05 DIAGNOSIS — R05 Cough: Secondary | ICD-10-CM | POA: Diagnosis present

## 2016-08-05 DIAGNOSIS — Z79899 Other long term (current) drug therapy: Secondary | ICD-10-CM | POA: Insufficient documentation

## 2016-08-05 HISTORY — DX: Paranoid schizophrenia: F20.0

## 2016-08-05 MED ORDER — GUAIFENESIN 100 MG/5ML PO LIQD: 100.0000 mg | ORAL | 0 refills | Status: DC | PRN

## 2016-08-05 MED ORDER — IBUPROFEN 600 MG PO TABS: 600.0000 mg | ORAL_TABLET | Freq: Four times a day (QID) | ORAL | 0 refills | Status: DC | PRN

## 2016-08-05 NOTE — ED Provider Notes (Signed)
[facility] DEPT Provider Note   CSN: [id number] Arrival date & time: 08/05/16  0125     History   Chief Complaint Chief Complaint  Patient presents with  . Cough  . Nasal Congestion    HPI [name] is a 65 y.o. male.  Patient with complaint of cough, nasal congestion, runny nose x 4-5 days. He reports they have been treating his apartment for roach infestation and he became concerned the chemicals they were using were making him short of breath. No fever, vomiting, headache, chills or loss of appetite. He is able to continue smoking. No chest pain.   The history is provided by the patient. No language interpreter was used.  Cough  Associated symptoms include rhinorrhea. Pertinent negatives include no chest pain, no chills, no sore throat and no myalgias.    Past Medical History:  Diagnosis Date  . BPH (benign prostatic hyperplasia)   . Colon polyp 2010  . Depression   . Hepatitis C   . Hypertension   . Paranoid schizophrenia ([place])   . Prostate atrophy   . Retinal vein occlusion   . Schizophrenia [facility])     Patient Active Problem List   Diagnosis Date Noted  . Pain in gums 02/28/2016  . Onychomycosis 01/17/2016  . Diastolic heart failure ([place]) 02/05/2015  . Rash and nonspecific skin eruption 01/03/2015  . Healthcare maintenance 05/28/2014  . Hyperlipidemia 03/10/2014  . Neck pain 05/12/2013  . Patellar tendonitis 11/16/2012  . [place] OCCLUSION 06/03/2010  . PARANOID SCHIZOPHRENIA, CHRONIC 01/17/2009  . Tobacco abuse counseling 01/17/2009  . HYPERTENSION, BENIGN ESSENTIAL 01/17/2009  . BPH (benign prostatic hyperplasia) 01/17/2009  . HEPATITIS C 12/14/2008  . DEPRESSION 12/14/2008    Past Surgical History:  Procedure Laterality Date  . CIRCUMCISION  1959       Home Medications    Prior to Admission medications   Medication Sig Start Date End Date Taking? Authorizing Provider  aspirin 81 MG tablet Take 1 tablet (81 mg total) by  mouth daily. 09/24/14   [name], MD  atorvastatin (LIPITOR) 40 MG tablet Take 1 tablet (40 mg total) by mouth daily. 11/13/15   [name], MD  benztropine (COGENTIN) 1 MG tablet Take 1 tablet (1 mg total) by mouth 2 (two) times daily. 06/03/12   Melony Overly, MD  cetirizine (ZYRTEC) 10 MG tablet take 1 tablet by mouth once daily 02/20/15   [name], MD  diclofenac sodium (VOLTAREN) 1 % GEL Apply to L knee QID prn pain 05/06/16   [name], MD  fluPHENAZine decanoate (PROLIXIN) 25 MG/ML injection Inject 25 mg into the muscle every 14 (fourteen) days. Last dose 09/07/12    Historical Provider, MD  Ledipasvir-Sofosbuvir (HARVONI PO) Take by mouth.    Historical Provider, MD  losartan (COZAAR) 50 MG tablet Take 1 tablet (50 mg total) by mouth daily. 03/31/16   Sela Hua, MD  Multiple Vitamin (MULTIVITAMIN WITH MINERALS) TABS tablet Take 1 tablet by mouth daily. 06/28/14   [name], MD  tamsulosin (FLOMAX) 0.4 MG CAPS capsule Take 2 capsules (0.8 mg total) by mouth daily. 08/22/15   [name], MD  traMADol (ULTRAM) 50 MG tablet Take 1 tablet (50 mg total) by mouth every 6 (six) hours as needed. 07/15/16   Hope Bunnie Pion, NP  triamcinolone cream (KENALOG) 0.1 % Apply 1 application topically 2 (two) times daily. For 5 days, then twice daily as needed for  rash and itching. 01/03/15   [name], MD  vitamin C (ASCORBIC ACID) 500 MG tablet Take 500 mg by mouth daily.    Historical Provider, MD  zoster vaccine live, PF, (ZOSTAVAX) 09811 UNT/0.65ML injection Inject 19,400 Units into the skin once. 11/01/15   [name], MD    Family History Family History  Problem Relation Age of Onset  . Hypertension Mother   . Diabetes Mother     Social History Social History  Substance Use Topics  . Smoking status: Current Every Day Smoker    Types: Cigarettes  . Smokeless tobacco: Never Used     Comment: pt reports only smoking 2 cigs a day    . Alcohol use No     Allergies   Lisinopril and Penicillins   Review of Systems Review of Systems  Constitutional: Negative for appetite change, chills and fever.  HENT: Positive for congestion and rhinorrhea. Negative for sore throat.   Respiratory: Positive for cough.   Cardiovascular: Negative.  Negative for chest pain.  Gastrointestinal: Negative.  Negative for abdominal pain and nausea.  Musculoskeletal: Negative.  Negative for myalgias.  Skin: Negative.  Negative for rash.  Neurological: Negative.      Physical Exam Updated Vital Signs BP 152/88 (BP Location: Left Arm)   Pulse 83   Temp 98.2 F (36.8 C) (Oral)   Resp 16   SpO2 99%   Physical Exam  Constitutional: He appears well-developed and well-nourished. No distress.  HENT:  Head: Normocephalic.  Neck: Normal range of motion. Neck supple.  Cardiovascular: Normal rate and regular rhythm.   No murmur heard. Pulmonary/Chest: Effort normal and breath sounds normal. He has no wheezes. He has no rales.  Abdominal: Soft. Bowel sounds are normal. There is no tenderness. There is no rebound and no guarding.  Musculoskeletal: Normal range of motion.  Neurological: He is alert. No cranial nerve deficit.  Skin: Skin is warm and dry. No rash noted.  Psychiatric: He has a normal mood and affect.     ED Treatments / Results  Labs (all labs ordered are listed, but only abnormal results are displayed) Labs Reviewed - No data to display  EKG  EKG Interpretation None       Radiology No results found.  Procedures Procedures (including critical care time)  Medications Ordered in ED Medications - No data to display   Initial Impression / Assessment and Plan / ED Course  I have reviewed the triage vital signs and the nursing notes.  Pertinent labs & imaging results that were available during my care of the patient were reviewed by me and considered in my medical decision making (see chart for  details).  Clinical Course     Patient with URI symptoms x 4-5 days became concerned about exposure to fumes from insect treatment in his apartment over the last 2-3 days.   Lungs clear. No hypoxia or tachypnea. No tachycardia. The patient appears well. He can be discharged home with supportive care.   Final Clinical Impressions(s) / ED Diagnoses   Final diagnoses:  None   1. URI New Prescriptions New Prescriptions   No medications on file     Charlann Lange, Hershal Coria [phone] [id number]    Delora Fuel, MD [phone]

## 2016-08-05 NOTE — ED Triage Notes (Signed)
Pt. reports productive cough with nasal congestion / runny nose onset this week , denies fever or chills .

## 2016-08-18 DIAGNOSIS — F209 Schizophrenia, unspecified: Secondary | ICD-10-CM | POA: Diagnosis not present

## 2016-08-20 ENCOUNTER — Ambulatory Visit (HOSPITAL_COMMUNITY)
Admission: RE | Admit: 2016-08-20 | Discharge: 2016-08-20 | Disposition: A | Discharge status: Home / Self Care | Payer: Medicare Other | Source: Ambulatory Visit | Attending: Family Medicine | Admitting: Family Medicine

## 2016-08-20 ENCOUNTER — Observation Stay (HOSPITAL_COMMUNITY)
Admission: EM | Admit: 2016-08-20 | Discharge: 2016-08-22 | Disposition: A | Discharge status: Home / Self Care | Payer: Medicare Other | Attending: Family Medicine | Admitting: Family Medicine

## 2016-08-20 ENCOUNTER — Other Ambulatory Visit: Payer: Self-pay

## 2016-08-20 ENCOUNTER — Emergency Department (HOSPITAL_COMMUNITY): Payer: Medicare Other

## 2016-08-20 ENCOUNTER — Encounter (HOSPITAL_COMMUNITY): Payer: Self-pay | Admitting: *Deleted

## 2016-08-20 ENCOUNTER — Other Ambulatory Visit (HOSPITAL_COMMUNITY): Payer: Self-pay

## 2016-08-20 ENCOUNTER — Ambulatory Visit (INDEPENDENT_AMBULATORY_CARE_PROVIDER_SITE_OTHER): Payer: Medicare Other | Admitting: Family Medicine

## 2016-08-20 VITALS — BP 208/102 | HR 57 | Temp 97.4°F | Ht 69.0 in | Wt 187.6 lb

## 2016-08-20 DIAGNOSIS — Z7982 Long term (current) use of aspirin: Secondary | ICD-10-CM | POA: Insufficient documentation

## 2016-08-20 DIAGNOSIS — I200 Unstable angina: Secondary | ICD-10-CM

## 2016-08-20 DIAGNOSIS — F17210 Nicotine dependence, cigarettes, uncomplicated: Secondary | ICD-10-CM | POA: Insufficient documentation

## 2016-08-20 DIAGNOSIS — F329 Major depressive disorder, single episode, unspecified: Secondary | ICD-10-CM | POA: Insufficient documentation

## 2016-08-20 DIAGNOSIS — F200 Paranoid schizophrenia: Secondary | ICD-10-CM | POA: Insufficient documentation

## 2016-08-20 DIAGNOSIS — R079 Chest pain, unspecified: Secondary | ICD-10-CM | POA: Insufficient documentation

## 2016-08-20 DIAGNOSIS — I10 Essential (primary) hypertension: Secondary | ICD-10-CM

## 2016-08-20 DIAGNOSIS — R0602 Shortness of breath: Secondary | ICD-10-CM | POA: Diagnosis not present

## 2016-08-20 DIAGNOSIS — I5030 Unspecified diastolic (congestive) heart failure: Secondary | ICD-10-CM | POA: Diagnosis not present

## 2016-08-20 DIAGNOSIS — B1920 Unspecified viral hepatitis C without hepatic coma: Secondary | ICD-10-CM | POA: Diagnosis not present

## 2016-08-20 DIAGNOSIS — Z888 Allergy status to other drugs, medicaments and biological substances status: Secondary | ICD-10-CM | POA: Diagnosis not present

## 2016-08-20 DIAGNOSIS — R001 Bradycardia, unspecified: Secondary | ICD-10-CM | POA: Diagnosis not present

## 2016-08-20 DIAGNOSIS — R0789 Other chest pain: Secondary | ICD-10-CM | POA: Diagnosis not present

## 2016-08-20 DIAGNOSIS — R072 Precordial pain: Secondary | ICD-10-CM | POA: Diagnosis not present

## 2016-08-20 DIAGNOSIS — Z880 Allergy status to penicillin: Secondary | ICD-10-CM | POA: Insufficient documentation

## 2016-08-20 DIAGNOSIS — N400 Enlarged prostate without lower urinary tract symptoms: Secondary | ICD-10-CM | POA: Insufficient documentation

## 2016-08-20 DIAGNOSIS — N182 Chronic kidney disease, stage 2 (mild): Secondary | ICD-10-CM | POA: Diagnosis not present

## 2016-08-20 DIAGNOSIS — Z79899 Other long term (current) drug therapy: Secondary | ICD-10-CM | POA: Diagnosis not present

## 2016-08-20 DIAGNOSIS — I130 Hypertensive heart and chronic kidney disease with heart failure and stage 1 through stage 4 chronic kidney disease, or unspecified chronic kidney disease: Secondary | ICD-10-CM | POA: Insufficient documentation

## 2016-08-20 DIAGNOSIS — I088 Other rheumatic multiple valve diseases: Secondary | ICD-10-CM | POA: Diagnosis not present

## 2016-08-20 DIAGNOSIS — Z720 Tobacco use: Secondary | ICD-10-CM

## 2016-08-20 DIAGNOSIS — E785 Hyperlipidemia, unspecified: Secondary | ICD-10-CM | POA: Insufficient documentation

## 2016-08-20 HISTORY — DX: Personal history of other medical treatment: Z92.89

## 2016-08-20 HISTORY — DX: Gastro-esophageal reflux disease without esophagitis: K21.9

## 2016-08-20 HISTORY — DX: Pure hypercholesterolemia, unspecified: E78.00

## 2016-08-20 LAB — COMPREHENSIVE METABOLIC PANEL
ALT: 21 U/L (ref 17–63)
AST: 33 U/L (ref 15–41)
Albumin: 4.1 g/dL (ref 3.5–5.0)
Alkaline Phosphatase: 56 U/L (ref 38–126)
Anion gap: 6 (ref 5–15)
BUN: 5 mg/dL — ABNORMAL LOW (ref 6–20)
CO2: 31 mmol/L (ref 22–32)
Calcium: 8.9 mg/dL (ref 8.9–10.3)
Chloride: 104 mmol/L (ref 101–111)
Creatinine, Ser: 1.45 mg/dL — ABNORMAL HIGH (ref 0.61–1.24)
GFR calc Af Amer: 57 mL/min — ABNORMAL LOW (ref >60)
GFR calc non Af Amer: 49 mL/min — ABNORMAL LOW (ref >60)
Glucose, Bld: 89 mg/dL (ref 65–99)
Potassium: 4.7 mmol/L (ref 3.5–5.1)
Sodium: 141 mmol/L (ref 135–145)
Total Bilirubin: 0.7 mg/dL (ref 0.3–1.2)
Total Protein: 7.1 g/dL (ref 6.5–8.1)

## 2016-08-20 LAB — I-STAT TROPONIN, ED: Troponin i, poc: 0.02 ng/mL (ref 0.00–0.08)

## 2016-08-20 LAB — CBC
HCT: 41.5 % (ref 39.0–52.0)
Hemoglobin: 14.5 g/dL (ref 13.0–17.0)
MCH: 30.5 pg (ref 26.0–34.0)
MCHC: 34.9 g/dL (ref 30.0–36.0)
MCV: 87.2 fL (ref 78.0–100.0)
Platelets: 124 10*3/uL — ABNORMAL LOW (ref 150–400)
RBC: 4.76 MIL/uL (ref 4.22–5.81)
RDW: 14.5 % (ref 11.5–15.5)
WBC: 6.7 10*3/uL (ref 4.0–10.5)

## 2016-08-20 LAB — TROPONIN I
Troponin I: <0.03 ng/mL (ref <0.03)
Troponin I: <0.03 ng/mL (ref <0.03)

## 2016-08-20 MED ORDER — ACETAMINOPHEN 325 MG PO TABS: 650.0000 mg | ORAL_TABLET | ORAL | Status: DC | PRN

## 2016-08-20 MED ORDER — BENZTROPINE MESYLATE 1 MG PO TABS
1.0000 mg | ORAL_TABLET | Freq: Two times a day (BID) | ORAL | Status: DC
  Administered 2016-08-21 – 2016-08-22 (×3): 1 mg via ORAL
  Filled 2016-08-20 (×6): qty 1

## 2016-08-20 MED ORDER — IOPAMIDOL (ISOVUE-370) INJECTION 76%
INTRAVENOUS | Status: AC
  Administered 2016-08-20: 100 mL
  Filled 2016-08-20: qty 100

## 2016-08-20 MED ORDER — ENOXAPARIN SODIUM 40 MG/0.4ML ~~LOC~~ SOLN: 40.0000 mg | SUBCUTANEOUS | Status: DC

## 2016-08-20 MED ORDER — ONDANSETRON HCL 4 MG/2ML IJ SOLN: 4.0000 mg | Freq: Four times a day (QID) | INTRAMUSCULAR | Status: DC | PRN

## 2016-08-20 MED ORDER — HYDROXYZINE HCL 10 MG PO TABS
10.0000 mg | ORAL_TABLET | Freq: Every evening | ORAL | Status: DC | PRN
  Administered 2016-08-20 – 2016-08-21 (×2): 10 mg via ORAL
  Filled 2016-08-20 (×2): qty 1

## 2016-08-20 MED ORDER — ATORVASTATIN CALCIUM 40 MG PO TABS
40.0000 mg | ORAL_TABLET | Freq: Every day | ORAL | Status: DC
  Administered 2016-08-20 – 2016-08-21 (×2): 40 mg via ORAL
  Filled 2016-08-20 (×2): qty 1

## 2016-08-20 MED ORDER — LOSARTAN POTASSIUM 50 MG PO TABS
50.0000 mg | ORAL_TABLET | Freq: Every day | ORAL | Status: DC
  Administered 2016-08-20 – 2016-08-21 (×2): 50 mg via ORAL
  Filled 2016-08-20 (×2): qty 1

## 2016-08-20 MED ORDER — TAMSULOSIN HCL 0.4 MG PO CAPS
0.8000 mg | ORAL_CAPSULE | Freq: Every day | ORAL | Status: DC
  Administered 2016-08-20 – 2016-08-22 (×3): 0.8 mg via ORAL
  Filled 2016-08-20 (×3): qty 2

## 2016-08-20 MED ORDER — NITROGLYCERIN 0.4 MG SL SUBL
0.4000 mg | SUBLINGUAL_TABLET | Freq: Once | SUBLINGUAL | Status: AC
  Administered 2016-08-20: 0.4 mg via SUBLINGUAL

## 2016-08-20 MED ORDER — ASPIRIN 325 MG PO TABS
325.0000 mg | ORAL_TABLET | Freq: Once | ORAL | Status: AC
  Administered 2016-08-20: 325 mg via ORAL

## 2016-08-20 MED ORDER — ASPIRIN EC 81 MG PO TBEC
81.0000 mg | DELAYED_RELEASE_TABLET | Freq: Every day | ORAL | Status: DC
  Administered 2016-08-20 – 2016-08-22 (×3): 81 mg via ORAL
  Filled 2016-08-20 (×3): qty 1

## 2016-08-20 NOTE — ED Notes (Signed)
Patient transported to CT 

## 2016-08-20 NOTE — Assessment & Plan Note (Signed)
Uncontrolled Has not taken any medications today Could be related to his chest pain and headaches

## 2016-08-20 NOTE — ED Triage Notes (Signed)
Pt in via [facility], per report pt has L sided CP onset x 1 wk, denies SOB, pt seen at PCP office & sent here for further eval, pt rcve 324 mg ASA pta & x2 SL nitro, A&O x4

## 2016-08-20 NOTE — ED Notes (Signed)
Pt transported to xray 

## 2016-08-20 NOTE — Progress Notes (Signed)
New Admission Note:   Arrival Method: Bed Mental Orientation: AOX4 Telemetry: 3E [address] 19 Assessment: Completed Skin: Intact IV: Right Arm x 2 Pain:0/10 Tubes: None Safety Measures: Safety Fall Prevention Plan has been discussed  Admission:  [facility] Orientation: Patient has been orientated to the room, unit and staff.  Family: wife at bedside  Orders to be reviewed and implemented. Will continue to monitor the patient. Call light has been placed within reach. Pt. refuses bed alarm at this time bed    Avrohom Mckelvin, RN Phone: [phone]

## 2016-08-20 NOTE — H&P (Signed)
[facility] Admission History and Physical Service Pager: [phone]  Patient name: [name] Medical record number: [id number] Date of birth: 11/19/1950 Age: 66 y.o. Gender: male  Primary Care Provider: Lavon Paganini, MD Consultants: Cardiology  Code Status: FULL  Chief Complaint: chest pain  Assessment and Plan: [name] is a 66 y.o. male presenting with chest pain . PMH is significant for diastolic heart failure, HTN, HLD, schizophrenia, hepatitis C, BPH.  New-onset Chest pain presented with 1 week of stable angina with acute worsening today. L sided sharp substernal chest pain, non-radiating, relieved with nitroglycerin and worse with exertion. Heart score 7(risk factors include tobacco use, HLD, HTN) so concern for ACS but poc trop 0.02 and EKG on admit shows sinus bradycardia without ST changes. Typical chest pain concerning for cardiac etiology. CXR showed widened aorta but CT chest was neg for aortic aneurysm or dissection, also neg for PE. CHF exacerbation is also unlikely given no dyspnea or signs of fluid overload. Without active chest pain on evaluation.  - Place in observation, attending Dr. Andria Frames - trend troponin - Monitor on telemetry - am EKG and labs - cardiology consult in the morning due to elevated HEART score, appreciate recommendations - will likely need a stress test -continue ASA  HTN BP on admit 179/86 but patient had missed today's dose of home losartan, has good medication compliance prior to today - restart home losartan 50mg  qd - monitor BP  HFpEF, stable. Echo 01/23/15 shows EF 65-70% with G1DD. No signs of fluid overload and patient has no shortness of breath - monitor  HLD at home on atorvastatin 40mg  daily - continue statin  BPH at home on flomax 0.8mg  qd - continue home flomax  Schizophrenia, stable. Followed by psychiatry outpatient, mood stable. At home on fluphenazine injection every 2 weeks and benztropine  1mg  BID - continue home benztropine  - Not due to for fluphenazine injection at this time so will hold  FEN/GI: heart healthy diet Prophylaxis: lovenox  Disposition: pending work-up  History of Present Illness:  [name] is a 66 y.o. male presenting with chest pain  Patient states has had intermittent chest pain for the past 1 week that occurred only with exertion, lasted approximately 10 min, had some associated SOB but would resolve with rest. Has never had chest pain before 1 week ago. Seen in clinic today for worsening chest pain. Today chest pain was L sided, sharp, non-radiating, rated 8 or 9 out of 10. Was relieved with nitroglycerin. Denied diaphoresis, nausea/vomiting.    Review Of Systems: Per HPI with the following additions:  Review of Systems  Constitutional: Negative for chills and fever.  Respiratory: Negative for shortness of breath.   Cardiovascular: Positive for chest pain. Negative for palpitations.  Gastrointestinal: Negative for abdominal pain, constipation, diarrhea, heartburn, nausea and vomiting.  Genitourinary: Negative for dysuria, frequency and urgency.  Musculoskeletal: Negative for back pain and myalgias.  Neurological: Negative for headaches.  Psychiatric/Behavioral: Depression:     Patient Active Problem List   Diagnosis Date Noted  . Chest pain 08/20/2016  . Pain in gums 02/28/2016  . Onychomycosis 01/17/2016  . Diastolic heart failure ([place]) 02/05/2015  . Rash and nonspecific skin eruption 01/03/2015  . Healthcare maintenance 05/28/2014  . Hyperlipidemia 03/10/2014  . Neck pain 05/12/2013  . Patellar tendonitis 11/16/2012  . [place] OCCLUSION 06/03/2010  . PARANOID SCHIZOPHRENIA, CHRONIC 01/17/2009  . Tobacco abuse counseling 01/17/2009  . HYPERTENSION, BENIGN ESSENTIAL  01/17/2009  . BPH (benign prostatic hyperplasia) 01/17/2009  . HEPATITIS C 12/14/2008  . DEPRESSION 12/14/2008    Past Medical History: Past Medical  History:  Diagnosis Date  . BPH (benign prostatic hyperplasia)   . Colon polyp 2010  . Depression   . Hepatitis C   . Hypertension   . Paranoid schizophrenia ([place])   . Prostate atrophy   . Retinal vein occlusion   . Schizophrenia [facility])     Past Surgical History: Past Surgical History:  Procedure Laterality Date  . CIRCUMCISION  1959  . TONSILLECTOMY      Social History: Social History  Substance Use Topics  . Smoking status: Current Every Day Smoker    Types: Cigarettes  . Smokeless tobacco: Never Used     Comment: pt reports only smoking 2 cigs a day  . Alcohol use No   Additional social history: 1/2 ppd for 12 years. No EtOH or recreational drug use. Lives at home with wife. Please also refer to relevant sections of EMR.  Family History: Family History  Problem Relation Age of Onset  . Hypertension Mother   . Diabetes Mother   Denies no family history of heart disease.  Allergies and Medications: Allergies  Allergen Reactions  . Lisinopril Other (See Comments)    Cough  . Penicillins Hives   No current facility-administered medications on file prior to encounter.    Current Outpatient Prescriptions on File Prior to Encounter  Medication Sig Dispense Refill  . aspirin 81 MG tablet Take 1 tablet (81 mg total) by mouth daily. 30 tablet 6  . atorvastatin (LIPITOR) 40 MG tablet Take 1 tablet (40 mg total) by mouth daily. 90 tablet 3  . benztropine (COGENTIN) 1 MG tablet Take 1 tablet (1 mg total) by mouth 2 (two) times daily. 60 tablet 6  . cetirizine (ZYRTEC) 10 MG tablet take 1 tablet by mouth once daily 30 tablet 6  . diclofenac sodium (VOLTAREN) 1 % GEL Apply to L knee QID prn pain (Patient taking differently: Apply 2 g topically 4 (four) times daily as needed (pain). Apply to L knee QID prn pain) 100 g 2  . fluPHENAZine decanoate (PROLIXIN) 25 MG/ML injection Inject 25 mg into the muscle every 14 (fourteen) days. Last dose 09/07/12    . guaiFENesin  (ROBITUSSIN) 100 MG/5ML liquid Take 5-10 mLs (100-200 mg total) by mouth every 4 (four) hours as needed for cough. 60 mL 0  . ibuprofen (ADVIL,MOTRIN) 600 MG tablet Take 1 tablet (600 mg total) by mouth every 6 (six) hours as needed. 30 tablet 0  . losartan (COZAAR) 50 MG tablet Take 1 tablet (50 mg total) by mouth daily. 90 tablet 1  . Multiple Vitamin (MULTIVITAMIN WITH MINERALS) TABS tablet Take 1 tablet by mouth daily. 30 tablet 6  . tamsulosin (FLOMAX) 0.4 MG CAPS capsule Take 2 capsules (0.8 mg total) by mouth daily. 60 capsule 11  . traMADol (ULTRAM) 50 MG tablet Take 1 tablet (50 mg total) by mouth every 6 (six) hours as needed. 10 tablet 0  . triamcinolone cream (KENALOG) 0.1 % Apply 1 application topically 2 (two) times daily. For 5 days, then twice daily as needed for rash and itching. 30 g 0  . vitamin C (ASCORBIC ACID) 500 MG tablet Take 500 mg by mouth daily.      Objective: BP 179/86 (BP Location: Left Arm)   Pulse (!) 45   Temp 98 F (36.7 C) (Oral)   Resp 14  Ht 5\' 9"  (1.753 m)   Wt 84.8 kg (187 lb)   SpO2 100%   BMI 27.62 kg/m  Exam: General: sitting up comfortably in bed, in no distress Eyes: PERRL, EOMI, conjunctiva clear ENTM: moist mucous membranes  Neck: supple, normal ROM Cardiovascular: bradycardic, regular rhythm, normal S1 and S2. No murmurs.  Respiratory: CTAB, normal work of breathing on room air, no chest tenderness Gastrointestinal: soft, nontender, nondistended, + bowel sounds MSK: No tenderness on palpation on sternum. Moving limbs spontaneously. No edema Derm: warm and dry Neuro: no focal deficits  Psych: normal affect and mood  Labs and Imaging: CBC BMET   Recent Labs Lab 08/20/16 1626  WBC 6.7  HGB 14.5  HCT 41.5  PLT 124*    Recent Labs Lab 08/20/16 1626  NA 141  K 4.7  CL 104  CO2 31  BUN 5*  CREATININE 1.45*  GLUCOSE 89  CALCIUM 8.9     POC troponin 0.02  Dg Chest 2 View  Result Date: 08/20/2016 CLINICAL DATA:  Upper  left thorax pain. EXAM: CHEST  2 VIEW COMPARISON:  09/09/2012 and priors. FINDINGS: Cardiomediastinal silhouette is normal. Mediastinal contours appear intact. The aorta is torturous with a suggestion of fusiform dilation of the ascending aorta. There is no evidence of focal airspace consolidation, pleural effusion or pneumothorax. Osseous structures are without acute abnormality, however the thoracic vertebral bodies are poorly visualized on the lateral view. Soft tissues are grossly normal. IMPRESSION: Torturous aorta with suggestion of a fusiform dilation of the ascending aorta. Further evaluation with CTA of the chest may be considered to exclude vascular abnormality. Electronically Signed   By: Fidela Salisbury M.D.   On: 08/20/2016 16:44   Ct Angio Chest/abd/pel For Dissection W And/or Wo Contrast  Result Date: 08/20/2016 CLINICAL DATA:  Substernal chest pain, shortness of breath with exertion. Uncontrolled hypertension. EXAM: CT ANGIOGRAPHY CHEST, ABDOMEN AND PELVIS TECHNIQUE: Multidetector CT imaging through the chest, abdomen and pelvis was performed using the standard protocol during bolus administration of intravenous contrast. Multiplanar reconstructed images and MIPs were obtained and reviewed to evaluate the vascular anatomy. CONTRAST:  100 cc Isovue 370 IV COMPARISON:  Chest x-ray today. FINDINGS: CTA CHEST FINDINGS Cardiovascular: No filling defects in the pulmonary arteries to suggest pulmonary emboli. Heart is upper limits normal in size. Aorta is normal caliber. No dissection. Mediastinum/Nodes: No mediastinal, hilar, or axillary adenopathy. Lungs/Pleura: Dependent ground-glass opacities, likely dependent atelectasis. Lungs otherwise clear. No effusions. Musculoskeletal: No acute bony abnormality or focal bone lesion. Review of the MIP images confirms the above findings. CTA ABDOMEN AND PELVIS FINDINGS VASCULAR Aorta: Normal caliber.  No dissection. Celiac: Widely patent SMA: Widely patent  Renals: Single bilaterally, widely patent. IMA: Widely patent Inflow: Calcifications in the distal aorta and common iliac arteries. No aneurysm or dissection. No stenosis. Veins: Grossly unremarkable. Review of the MIP images confirms the above findings. NON-VASCULAR Hepatobiliary: No focal hepatic abnormality. Gallbladder unremarkable. Pancreas: No focal abnormality or ductal dilatation. Spleen: No focal abnormality.  Normal size. Adrenals/Urinary Tract: No adrenal abnormality. No focal renal abnormality. No stones or hydronephrosis. Urinary bladder is unremarkable. Stomach/Bowel: Appendix is normal. Stomach, large and small bowel grossly unremarkable. Lymphatic: No adenopathy. Reproductive:  No visible focal abnormality. Other: No free fluid or free air. Musculoskeletal: No acute bony abnormality or focal bone lesion. Review of the MIP images confirms the above findings. IMPRESSION: No evidence of aortic aneurysm or dissection. Mild atherosclerotic disease in the distal abdominal aorta and common iliac arteries.  No evidence of pulmonary embolus. No acute findings in the chest, abdomen or pelvis. Electronically Signed   By: Rolm Baptise M.D.   On: 08/20/2016 18:19    Bufford Lope, DO 08/20/2016, 7:37 PM PGY-1, [place] Intern pager: [phone], text pages welcome  FPTS Upper-Level Resident Addendum  I have independently interviewed and examined the patient. I have discussed the above with the original author and agree with their documentation. My edits for correction/addition/clarification are in pink. Please see also any attending notes.   Katheren Shams, DO PGY-3, [place] Service pager: [phone] (text pages welcome through [facility])

## 2016-08-20 NOTE — Assessment & Plan Note (Signed)
Current substernal chest pain Symptoms concerning for cardiac etiology given patient's history of hypertension and smoking as well as substernal pain, pain associated with shortness of breath and activity, and resolution with rest Also concerning that this is in the setting of uncontrolled hypertension EKG with possible slight elevation of ST segment in V2 and V3 Patient given 325 mg of aspirin, and sublingual nitroglycerin in clinic EMS called and patient transported to emergency department for further management

## 2016-08-20 NOTE — Progress Notes (Signed)
   Subjective:   [name] is a 66 y.o. male with a history of HTN, diastolic heart failure, patellar tendinitis, hepatitis C, paranoid schizophrenia, HLD here for chest pain  HTN: - Medications: Losartan 50 mg daily - Compliance: good, but has not been able to take today - Checking BP at home: no  CP intermittent for 1 wk Achy, substernal Associated with SOB Feels lightheaded and "drunk" with CP Worse when out and about and walking Goes away with rest New symptom for him - no h/o angina SOB even at rest sometimes No diaphoresis or nausea No vision changes No LE edema Intermittent headaches for last week as well CP present currently Has not tried any medications   Review of Systems:  Per HPI.   Social History: current smoker - 1/2 PPD  Objective:  BP (!) 208/102   Pulse (!) 57   Temp 97.4 F (36.3 C) (Oral)   Ht 5\' 9"  (1.753 m)   Wt 187 lb 9.6 oz (85.1 kg)   BMI 27.70 kg/m   Gen:  66 y.o. male in NAD HEENT: NCAT, MMM, EOMI, PERRL, anicteric sclerae CV: RRR, no MRG Resp: Non-labored, CTAB, no wheezes noted Abd: Soft, NTND Ext: WWP, no edema MSK: Gait intact, chest not tender to palpation Neuro: Alert and oriented, speech normal, CN 2-12 intact, strength and sensation intact in UEs and LEs       Chemistry      Component Value Date/Time   NA 144 09/17/2015 1410   K 4.1 09/17/2015 1410   CL 106 09/17/2015 1410   CO2 29 09/17/2015 1410   BUN 13 09/17/2015 1410   CREATININE 1.37 (H) 09/17/2015 1410      Component Value Date/Time   CALCIUM 9.3 09/17/2015 1410   ALKPHOS 58 01/03/2015 1605   AST 21 01/03/2015 1605   ALT 10 01/03/2015 1605   BILITOT 0.6 01/03/2015 1605      Lab Results  Component Value Date   WBC 6.5 01/03/2015   HGB 13.8 01/03/2015   HCT 41.0 01/03/2015   MCV 88.9 01/03/2015   PLT 114 (L) 01/03/2015   Lab Results  Component Value Date   TSH 1.412 01/03/2015   Lab Results  Component Value Date   HGBA1C 5.3 05/15/2015    Assessment & Plan:     [name] is a 66 y.o. male here for   HYPERTENSION, BENIGN ESSENTIAL Uncontrolled Has not taken any medications today Could be related to his chest pain and headaches  Chest pain Current substernal chest pain Symptoms concerning for cardiac etiology given patient's history of hypertension and smoking as well as substernal pain, pain associated with shortness of breath and activity, and resolution with rest Also concerning that this is in the setting of uncontrolled hypertension EKG with possible slight elevation of ST segment in V2 and V3 Patient given 325 mg of aspirin, and sublingual nitroglycerin in clinic EMS called and patient transported to emergency department for further management    [name], MD MPH PGY-3,  [place] [facility] 08/20/2016  3:09 PM

## 2016-08-20 NOTE — ED Provider Notes (Signed)
[facility] DEPT Provider Note   CSN: [id number] Arrival date & time: 08/20/16  1534     History   Chief Complaint Chief Complaint  Patient presents with  . Chest Pain    HPI [name] is a 66 y.o. male.  Patient with history of high cholesterol, hypertension, smoking, previous ECHO showing grade 1 diastolic dysfunction with preserved EF -- presents with complaint of intermittent chest pains and shortness of breath or the past 1 week. Patient describes sharp, nonradiating pain in the left chest that is worse sometimes with activity. Patient has shortness of breath and dizziness with these episodes. These are new symptoms for him. He denies associated vomiting, diaphoresis. He denies abdominal pain, full syncope. Patient was seen by his primary care physician prior to being brought to the emergency department. He received aspirin and to nitroglycerin. Currently denies chest pain and shortness of breath. Patient denies risk factors for pulmonary embolism including: unilateral leg swelling, history of DVT/PE/other blood clots, use of exogenous hormones, recent immobilizations, recent surgery, recent travel (>4hr segment), malignancy, hemoptysis.        Past Medical History:  Diagnosis Date  . BPH (benign prostatic hyperplasia)   . Colon polyp 2010  . Depression   . Hepatitis C   . Hypertension   . Paranoid schizophrenia ([place])   . Prostate atrophy   . Retinal vein occlusion   . Schizophrenia [facility])     Patient Active Problem List   Diagnosis Date Noted  . Chest pain 08/20/2016  . Pain in gums 02/28/2016  . Onychomycosis 01/17/2016  . Diastolic heart failure ([place]) 02/05/2015  . Rash and nonspecific skin eruption 01/03/2015  . Healthcare maintenance 05/28/2014  . Hyperlipidemia 03/10/2014  . Neck pain 05/12/2013  . Patellar tendonitis 11/16/2012  . [place] OCCLUSION 06/03/2010  . PARANOID SCHIZOPHRENIA, CHRONIC 01/17/2009  . Tobacco abuse counseling  01/17/2009  . HYPERTENSION, BENIGN ESSENTIAL 01/17/2009  . BPH (benign prostatic hyperplasia) 01/17/2009  . HEPATITIS C 12/14/2008  . DEPRESSION 12/14/2008    Past Surgical History:  Procedure Laterality Date  . CIRCUMCISION  1959       Home Medications    Prior to Admission medications   Medication Sig Start Date End Date Taking? Authorizing Provider  aspirin 81 MG tablet Take 1 tablet (81 mg total) by mouth daily. 09/24/14   [name], MD  atorvastatin (LIPITOR) 40 MG tablet Take 1 tablet (40 mg total) by mouth daily. 11/13/15   [name], MD  benztropine (COGENTIN) 1 MG tablet Take 1 tablet (1 mg total) by mouth 2 (two) times daily. 06/03/12   Melony Overly, MD  cetirizine (ZYRTEC) 10 MG tablet take 1 tablet by mouth once daily 02/20/15   [name], MD  diclofenac sodium (VOLTAREN) 1 % GEL Apply to L knee QID prn pain 05/06/16   [name], MD  fluPHENAZine decanoate (PROLIXIN) 25 MG/ML injection Inject 25 mg into the muscle every 14 (fourteen) days. Last dose 09/07/12    Historical Provider, MD  guaiFENesin (ROBITUSSIN) 100 MG/5ML liquid Take 5-10 mLs (100-200 mg total) by mouth every 4 (four) hours as needed for cough. 08/05/16   Charlann Lange, PA-C  ibuprofen (ADVIL,MOTRIN) 600 MG tablet Take 1 tablet (600 mg total) by mouth every 6 (six) hours as needed. 08/05/16   Shari Upstill, PA-C  Ledipasvir-Sofosbuvir (HARVONI PO) Take by mouth.    Historical Provider, MD  losartan (COZAAR) 50 MG tablet Take 1 tablet (50  mg total) by mouth daily. 03/31/16   Sela Hua, MD  Multiple Vitamin (MULTIVITAMIN WITH MINERALS) TABS tablet Take 1 tablet by mouth daily. 06/28/14   [name], MD  tamsulosin (FLOMAX) 0.4 MG CAPS capsule Take 2 capsules (0.8 mg total) by mouth daily. 08/22/15   [name], MD  traMADol (ULTRAM) 50 MG tablet Take 1 tablet (50 mg total) by mouth every 6 (six) hours as needed. 07/15/16   Hope Bunnie Pion, NP  triamcinolone  cream (KENALOG) 0.1 % Apply 1 application topically 2 (two) times daily. For 5 days, then twice daily as needed for rash and itching. 01/03/15   [name], MD  vitamin C (ASCORBIC ACID) 500 MG tablet Take 500 mg by mouth daily.    Historical Provider, MD  zoster vaccine live, PF, (ZOSTAVAX) 16109 UNT/0.65ML injection Inject 19,400 Units into the skin once. 11/01/15   [name], MD    Family History Family History  Problem Relation Age of Onset  . Hypertension Mother   . Diabetes Mother     Social History Social History  Substance Use Topics  . Smoking status: Current Every Day Smoker    Types: Cigarettes  . Smokeless tobacco: Never Used     Comment: pt reports only smoking 2 cigs a day  . Alcohol use No     Allergies   Lisinopril and Penicillins   Review of Systems Review of Systems  Constitutional: Negative for diaphoresis and fever.  Eyes: Negative for redness.  Respiratory: Positive for shortness of breath. Negative for cough.   Cardiovascular: Positive for chest pain. Negative for palpitations and leg swelling.  Gastrointestinal: Negative for abdominal pain, nausea and vomiting.  Genitourinary: Negative for dysuria.  Musculoskeletal: Negative for back pain and neck pain.  Skin: Negative for rash.  Neurological: Positive for dizziness. Negative for syncope and light-headedness.  Psychiatric/Behavioral: The patient is not nervous/anxious.      Physical Exam Updated Vital Signs Pulse 105   Resp 16   SpO2 (!) 57%   Physical Exam  Constitutional: He appears well-developed and well-nourished.  HENT:  Head: Normocephalic and atraumatic.  Eyes: Conjunctivae are normal. Right eye exhibits no discharge. Left eye exhibits no discharge.  Neck: Normal range of motion. Neck supple.  Cardiovascular: Normal rate, regular rhythm and normal heart sounds.   Pulmonary/Chest: Effort normal and breath sounds normal.  Abdominal: Soft. There is no tenderness.    Neurological: He is alert.  Skin: Skin is warm and dry.  Psychiatric: He has a normal mood and affect.  Nursing note and vitals reviewed.    ED Treatments / Results  Labs (all labs ordered are listed, but only abnormal results are displayed) Labs Reviewed  CBC - Abnormal; Notable for the following:       Result Value   Platelets 124 (*)    All other components within normal limits  COMPREHENSIVE METABOLIC PANEL - Abnormal; Notable for the following:    BUN 5 (*)    Creatinine, Ser 1.45 (*)    GFR calc non Af Amer 49 (*)    GFR calc Af Amer 57 (*)    All other components within normal limits  TROPONIN I  TROPONIN I  TROPONIN I  CBC  BASIC METABOLIC PANEL  Randolm Idol, ED    ED ECG REPORT   Date: 08/20/2016  Rate: 52  Rhythm: sinus bradycardia  QRS Axis: normal  Intervals: normal  ST/T Wave abnormalities: nonspecific T wave changes  Conduction Disutrbances:none  Narrative Interpretation:   Old EKG Reviewed: unchanged  I have personally reviewed the EKG tracing and agree with the computerized printout as noted.  Radiology Dg Chest 2 View  Result Date: 08/20/2016 CLINICAL DATA:  Upper left thorax pain. EXAM: CHEST  2 VIEW COMPARISON:  09/09/2012 and priors. FINDINGS: Cardiomediastinal silhouette is normal. Mediastinal contours appear intact. The aorta is torturous with a suggestion of fusiform dilation of the ascending aorta. There is no evidence of focal airspace consolidation, pleural effusion or pneumothorax. Osseous structures are without acute abnormality, however the thoracic vertebral bodies are poorly visualized on the lateral view. Soft tissues are grossly normal. IMPRESSION: Torturous aorta with suggestion of a fusiform dilation of the ascending aorta. Further evaluation with CTA of the chest may be considered to exclude vascular abnormality. Electronically Signed   By: Fidela Salisbury M.D.   On: 08/20/2016 16:44   Ct Angio Chest/abd/pel For Dissection  W And/or Wo Contrast  Result Date: 08/20/2016 CLINICAL DATA:  Substernal chest pain, shortness of breath with exertion. Uncontrolled hypertension. EXAM: CT ANGIOGRAPHY CHEST, ABDOMEN AND PELVIS TECHNIQUE: Multidetector CT imaging through the chest, abdomen and pelvis was performed using the standard protocol during bolus administration of intravenous contrast. Multiplanar reconstructed images and MIPs were obtained and reviewed to evaluate the vascular anatomy. CONTRAST:  100 cc Isovue 370 IV COMPARISON:  Chest x-ray today. FINDINGS: CTA CHEST FINDINGS Cardiovascular: No filling defects in the pulmonary arteries to suggest pulmonary emboli. Heart is upper limits normal in size. Aorta is normal caliber. No dissection. Mediastinum/Nodes: No mediastinal, hilar, or axillary adenopathy. Lungs/Pleura: Dependent ground-glass opacities, likely dependent atelectasis. Lungs otherwise clear. No effusions. Musculoskeletal: No acute bony abnormality or focal bone lesion. Review of the MIP images confirms the above findings. CTA ABDOMEN AND PELVIS FINDINGS VASCULAR Aorta: Normal caliber.  No dissection. Celiac: Widely patent SMA: Widely patent Renals: Single bilaterally, widely patent. IMA: Widely patent Inflow: Calcifications in the distal aorta and common iliac arteries. No aneurysm or dissection. No stenosis. Veins: Grossly unremarkable. Review of the MIP images confirms the above findings. NON-VASCULAR Hepatobiliary: No focal hepatic abnormality. Gallbladder unremarkable. Pancreas: No focal abnormality or ductal dilatation. Spleen: No focal abnormality.  Normal size. Adrenals/Urinary Tract: No adrenal abnormality. No focal renal abnormality. No stones or hydronephrosis. Urinary bladder is unremarkable. Stomach/Bowel: Appendix is normal. Stomach, large and small bowel grossly unremarkable. Lymphatic: No adenopathy. Reproductive:  No visible focal abnormality. Other: No free fluid or free air. Musculoskeletal: No acute bony  abnormality or focal bone lesion. Review of the MIP images confirms the above findings. IMPRESSION: No evidence of aortic aneurysm or dissection. Mild atherosclerotic disease in the distal abdominal aorta and common iliac arteries. No evidence of pulmonary embolus. No acute findings in the chest, abdomen or pelvis. Electronically Signed   By: Rolm Baptise M.D.   On: 08/20/2016 18:19    Procedures Procedures (including critical care time)  Medications Ordered in ED Medications  losartan (COZAAR) tablet 50 mg (50 mg Oral Given 08/20/16 2119)  atorvastatin (LIPITOR) tablet 40 mg (40 mg Oral Given 08/20/16 2120)  tamsulosin (FLOMAX) capsule 0.8 mg (0.8 mg Oral Given 08/20/16 2119)  aspirin EC tablet 81 mg (81 mg Oral Given 08/20/16 2119)  acetaminophen (TYLENOL) tablet 650 mg (not administered)  ondansetron (ZOFRAN) injection 4 mg (not administered)  enoxaparin (LOVENOX) injection 40 mg (40 mg Subcutaneous Not Given 08/20/16 2120)  benztropine (COGENTIN) tablet 1 mg (1 mg Oral Not Given 08/20/16 2302)  hydrOXYzine (ATARAX/VISTARIL) tablet 10  mg (10 mg Oral Given 08/20/16 2119)  iopamidol (ISOVUE-370) 76 % injection (100 mLs  Contrast Given 08/20/16 1745)     Initial Impression / Assessment and Plan / ED Course  I have reviewed the triage vital signs and the nursing notes.  Pertinent labs & imaging results that were available during my care of the patient were reviewed by me and considered in my medical decision making (see chart for details).  Clinical Course    Patient seen and examined. EKG reviewed. Initial documented vital signs are incorrect. HR in 50's. O2 sat appears not to be picking up appropriately, poor waveform. Work-up initiated.   Vital signs reviewed and are as follows: Pulse 105   Resp 16   Ht 5\' 9"  (1.753 m)   Wt 84.8 kg   SpO2 (!) 57%   BMI 27.62 kg/m   Patient discussed with and seen by Dr. Thomasene Lot.   CT ordered given CXR with possible abnormality. This is negative for PE,  dissection, aneurysm.  Will admit for CP given risk factors and history.   Spoke with Buford who will see and admit.   Final Clinical Impressions(s) / ED Diagnoses   Final diagnoses:  Chest pain, unspecified type   AdMIT.   New Prescriptions Current Discharge Medication List       Carlisle Cater, PA-C 08/20/16 [place], MD 08/24/16 [phone]

## 2016-08-21 ENCOUNTER — Encounter (HOSPITAL_COMMUNITY): Payer: Self-pay | Admitting: Cardiology

## 2016-08-21 ENCOUNTER — Other Ambulatory Visit: Payer: Self-pay

## 2016-08-21 DIAGNOSIS — E785 Hyperlipidemia, unspecified: Secondary | ICD-10-CM

## 2016-08-21 DIAGNOSIS — R072 Precordial pain: Secondary | ICD-10-CM | POA: Diagnosis not present

## 2016-08-21 DIAGNOSIS — Z720 Tobacco use: Secondary | ICD-10-CM

## 2016-08-21 DIAGNOSIS — R079 Chest pain, unspecified: Secondary | ICD-10-CM | POA: Diagnosis not present

## 2016-08-21 DIAGNOSIS — I5030 Unspecified diastolic (congestive) heart failure: Secondary | ICD-10-CM | POA: Diagnosis not present

## 2016-08-21 DIAGNOSIS — I130 Hypertensive heart and chronic kidney disease with heart failure and stage 1 through stage 4 chronic kidney disease, or unspecified chronic kidney disease: Secondary | ICD-10-CM | POA: Diagnosis not present

## 2016-08-21 DIAGNOSIS — I10 Essential (primary) hypertension: Secondary | ICD-10-CM

## 2016-08-21 DIAGNOSIS — N182 Chronic kidney disease, stage 2 (mild): Secondary | ICD-10-CM | POA: Diagnosis not present

## 2016-08-21 LAB — CBC
HCT: 39.7 % (ref 39.0–52.0)
Hemoglobin: 13.9 g/dL (ref 13.0–17.0)
MCH: 30.2 pg (ref 26.0–34.0)
MCHC: 35 g/dL (ref 30.0–36.0)
MCV: 86.3 fL (ref 78.0–100.0)
Platelets: 115 10*3/uL — ABNORMAL LOW (ref 150–400)
RBC: 4.6 MIL/uL (ref 4.22–5.81)
RDW: 14.6 % (ref 11.5–15.5)
WBC: 7.6 10*3/uL (ref 4.0–10.5)

## 2016-08-21 LAB — BASIC METABOLIC PANEL
Anion gap: 9 (ref 5–15)
BUN: 16 mg/dL (ref 6–20)
CO2: 25 mmol/L (ref 22–32)
Calcium: 8.7 mg/dL — ABNORMAL LOW (ref 8.9–10.3)
Chloride: 109 mmol/L (ref 101–111)
Creatinine, Ser: 1.4 mg/dL — ABNORMAL HIGH (ref 0.61–1.24)
GFR calc Af Amer: 59 mL/min — ABNORMAL LOW (ref >60)
GFR calc non Af Amer: 51 mL/min — ABNORMAL LOW (ref >60)
Glucose, Bld: 103 mg/dL — ABNORMAL HIGH (ref 65–99)
Potassium: 4.3 mmol/L (ref 3.5–5.1)
Sodium: 143 mmol/L (ref 135–145)

## 2016-08-21 LAB — LIPID PANEL
Cholesterol: 96 mg/dL (ref 0–200)
HDL: 37 mg/dL — ABNORMAL LOW (ref >40)
LDL Cholesterol: 49 mg/dL (ref 0–99)
Total CHOL/HDL Ratio: 2.6 RATIO
Triglycerides: 50 mg/dL (ref <150)
VLDL: 10 mg/dL (ref 0–40)

## 2016-08-21 LAB — TSH: TSH: 0.856 u[IU]/mL (ref 0.350–4.500)

## 2016-08-21 LAB — TROPONIN I: Troponin I: <0.03 ng/mL (ref <0.03)

## 2016-08-21 MED ORDER — LOSARTAN POTASSIUM 50 MG PO TABS
50.0000 mg | ORAL_TABLET | ORAL | Status: AC
  Administered 2016-08-21: 50 mg via ORAL
  Filled 2016-08-21: qty 1

## 2016-08-21 MED ORDER — LOSARTAN POTASSIUM 50 MG PO TABS: 50.0000 mg | ORAL_TABLET | Freq: Once | ORAL | Status: DC

## 2016-08-21 MED ORDER — LOSARTAN POTASSIUM 50 MG PO TABS
100.0000 mg | ORAL_TABLET | Freq: Every day | ORAL | Status: DC
  Administered 2016-08-22: 100 mg via ORAL
  Filled 2016-08-21: qty 2

## 2016-08-21 MED ORDER — AMLODIPINE BESYLATE 5 MG PO TABS
5.0000 mg | ORAL_TABLET | Freq: Every day | ORAL | Status: DC
  Administered 2016-08-22: 5 mg via ORAL
  Filled 2016-08-21: qty 1

## 2016-08-21 NOTE — Progress Notes (Signed)
Paged on call MD regarding pt has not been NPO and MD recommending stress test today. MD stated he will look over chart and place orders if needed.  [name]

## 2016-08-21 NOTE — Progress Notes (Signed)
Patient with no complaints or concerns during 7pm - 7am shift.  Zenna Traister, RN 

## 2016-08-21 NOTE — Progress Notes (Signed)
Transitions of Care Pharmacy Note  Plan:  -Educated on importance of heart healthy lifestyle, including limiting salt intake, healthy diet, regular measurement of BP, and exercise -Advised that losartan dose has been increased and amlodipine may be added as well prior to discharge --------------------------------------------- [name] is an 66 y.o. male who presents with a chief complaint of CP. In anticipation of discharge, pharmacy has reviewed this patient's prior to admission medication history, as well as current inpatient medications listed per the [facility].  Current medication indications, dosing, frequency, and notable side effects reviewed with patient and wife . patient and wife  verbalized understanding of current inpatient medication regimen and are aware that the After Visit Summary when presented, will represent the most accurate medication list at discharge.   Assessment: Understanding of regimen: fair Understanding of indications: fair Potential of compliance: fair Barriers to Obtaining Medications: No  Patient instructed to contact inpatient pharmacy team with further questions or concerns if needed.    Time spent preparing for discharge counseling: 15 Time spent counseling patient: 20   Thank you for allowing pharmacy to be a part of this patient's care.  Arrie Senate, PharmD PGY-1 Pharmacy Resident Pager: [phone] 08/21/2016

## 2016-08-21 NOTE — Progress Notes (Signed)
EKG performed,  Pt's ethnicity says Caucasian on it, however patient is African-American. Will continue to monitor.  Yalexa Blust, RN

## 2016-08-21 NOTE — Progress Notes (Signed)
Family Medicine Teaching Service Daily Progress Note Intern Pager: [phone]  Patient name: [name] Medical record number: [id number] Date of birth: 10/22/1950 Age: 66 y.o. Gender: male  Primary Care Provider: Lavon Paganini, MD Consultants: Cardiology  Code Status: FULL  Pt Overview and Major Events to Date:  1/4 Placed in observation for chest pain  Assessment and Plan: [name] is a 66 y.o. male presenting with chest pain . PMH is significant for diastolic heart failure, HTN, HLD, schizophrenia, hepatitis C, BPH.  New-onset Chest pain presented with 1 week of stable angina with acute worsening on day of admission. L sided sharp substernal chest pain, non-radiating, relieved with nitroglycerin and worse with exertion. Typical chest pain concerning for cardiac etiology and HEART score 7(risk factors include tobacco use, HLD, HTN) but troponins negx3 and EKG shows sinus bradycardia without ST changes. CXR showed widened aorta but CT chest was neg for aortic aneurysm or dissection, also neg for PE. CHF exacerbation is also unlikely given no dyspnea or signs of fluid overload. Without active chest pain on evaluation.  - Troponins neg x 3 - am EKG shows sinus bradycardia - continue ASA - Monitor on telemetry - per cardiology: stress test today  - risk stratification labs: TSH, a1c, lipid panel  HTN BP this am 165/90 but patient had missed yesterday's dose of home losartan, endorses good medication compliance prior to admission - increase losartan from 50mg  to 100 mg qd - monitor BP  HFpEF, stable. Echo 01/23/15 shows EF 65-70% with G1DD. No signs of fluid overload and patient has no shortness of breath - monitor  CKD stage 2, stable. Cr 1.4 today, baseline is ~1.4 -monitor  HLD at home on atorvastatin 40mg  daily - continue statin  BPH at home on flomax 0.8mg  qd - continue home flomax  Schizophrenia, stable. Followed by psychiatry outpatient, mood stable. At home on  fluphenazine injection every 2 weeks and benztropine 1mg  BID - continue home benztropine  - Not due to for fluphenazine injection at this time so will hold  FEN/GI: NPO  Prophylaxis: lovenox  Disposition: pending work-up  Subjective:  Feeling well this am, no CP or palpitations. Has no complaints this am  Objective: Temp:  [97.4 F (36.3 C)-98.7 F (37.1 C)] 98.3 F (36.8 C) (01/05 0503) Pulse Rate:  [45-105] 56 (01/05 0503) Resp:  [14-23] 18 (01/05 0043) BP: (159-208)/(82-102) 165/90 (01/05 0503) SpO2:  [56 %-100 %] 96 % (01/05 0503) Weight:  [176 lb 8 oz (80.1 kg)-187 lb 9.6 oz (85.1 kg)] 176 lb 8 oz (80.1 kg) (01/05 0503) Physical Exam: General: Sitting up in bed comfortably, in no distress Cardiovascular: bradycardic, regular rhythm, normal S1 and S2. No murmurs.  Respiratory: CTAB, normal work of breathing on room air Abdomen: soft, nontender, nondistended, + bowel sounds Extremities: no edema  Laboratory:  Recent Labs Lab 08/20/16 1626 08/21/16 0210  WBC 6.7 7.6  HGB 14.5 13.9  HCT 41.5 39.7  PLT 124* 115*    Recent Labs Lab 08/20/16 1626 08/21/16 0210  NA 141 143  K 4.7 4.3  CL 104 109  CO2 31 25  BUN 5* 16  CREATININE 1.45* 1.40*  CALCIUM 8.9 8.7*  PROT 7.1  --   BILITOT 0.7  --   ALKPHOS 56  --   ALT 21  --   AST 33  --   GLUCOSE 89 103*   Imaging/Diagnostic Tests: Dg Chest 2 View  Result Date: 08/20/2016 CLINICAL DATA:  Upper left thorax  pain. EXAM: CHEST  2 VIEW COMPARISON:  09/09/2012 and priors. FINDINGS: Cardiomediastinal silhouette is normal. Mediastinal contours appear intact. The aorta is torturous with a suggestion of fusiform dilation of the ascending aorta. There is no evidence of focal airspace consolidation, pleural effusion or pneumothorax. Osseous structures are without acute abnormality, however the thoracic vertebral bodies are poorly visualized on the lateral view. Soft tissues are grossly normal. IMPRESSION: Torturous aorta  with suggestion of a fusiform dilation of the ascending aorta. Further evaluation with CTA of the chest may be considered to exclude vascular abnormality. Electronically Signed   By: Fidela Salisbury M.D.   On: 08/20/2016 16:44   Ct Angio Chest/abd/pel For Dissection W And/or Wo Contrast  Result Date: 08/20/2016 CLINICAL DATA:  Substernal chest pain, shortness of breath with exertion. Uncontrolled hypertension. EXAM: CT ANGIOGRAPHY CHEST, ABDOMEN AND PELVIS TECHNIQUE: Multidetector CT imaging through the chest, abdomen and pelvis was performed using the standard protocol during bolus administration of intravenous contrast. Multiplanar reconstructed images and MIPs were obtained and reviewed to evaluate the vascular anatomy. CONTRAST:  100 cc Isovue 370 IV COMPARISON:  Chest x-ray today. FINDINGS: CTA CHEST FINDINGS Cardiovascular: No filling defects in the pulmonary arteries to suggest pulmonary emboli. Heart is upper limits normal in size. Aorta is normal caliber. No dissection. Mediastinum/Nodes: No mediastinal, hilar, or axillary adenopathy. Lungs/Pleura: Dependent ground-glass opacities, likely dependent atelectasis. Lungs otherwise clear. No effusions. Musculoskeletal: No acute bony abnormality or focal bone lesion. Review of the MIP images confirms the above findings. CTA ABDOMEN AND PELVIS FINDINGS VASCULAR Aorta: Normal caliber.  No dissection. Celiac: Widely patent SMA: Widely patent Renals: Single bilaterally, widely patent. IMA: Widely patent Inflow: Calcifications in the distal aorta and common iliac arteries. No aneurysm or dissection. No stenosis. Veins: Grossly unremarkable. Review of the MIP images confirms the above findings. NON-VASCULAR Hepatobiliary: No focal hepatic abnormality. Gallbladder unremarkable. Pancreas: No focal abnormality or ductal dilatation. Spleen: No focal abnormality.  Normal size. Adrenals/Urinary Tract: No adrenal abnormality. No focal renal abnormality. No stones or  hydronephrosis. Urinary bladder is unremarkable. Stomach/Bowel: Appendix is normal. Stomach, large and small bowel grossly unremarkable. Lymphatic: No adenopathy. Reproductive:  No visible focal abnormality. Other: No free fluid or free air. Musculoskeletal: No acute bony abnormality or focal bone lesion. Review of the MIP images confirms the above findings. IMPRESSION: No evidence of aortic aneurysm or dissection. Mild atherosclerotic disease in the distal abdominal aorta and common iliac arteries. No evidence of pulmonary embolus. No acute findings in the chest, abdomen or pelvis. Electronically Signed   By: Rolm Baptise M.D.   On: 08/20/2016 18:19    Bufford Lope, DO 08/21/2016, 6:42 AM PGY-1, [place] Intern pager: [phone], text pages welcome

## 2016-08-21 NOTE — Consult Note (Signed)
Cardiology Consult    Patient ID: [name] MRN: [id number], DOB/AGE: 09/06/1950   Admit date: 08/20/2016 Date of Consult: 08/21/2016  Primary Physician: Lavon Paganini, MD Reason for Consult: Chest pain Primary Cardiologist: New- Dr. Claiborne Billings Requesting Provider: Dr. Andria Frames   History of Present Illness    [name] is a 66 y.o. male with past medical history of grade 1 diastolic dysfunction (echo 01/23/15), Hypertension, hyperlipidemia, schizophrenia, hepatitis C, half pack per day smoker and BPH who was seen at his PCP office yesterday with complaints of chest pain. BP at that time was 208/102. His symptoms were concerning for cardiac etiology and he was sent by EMS to the [facility] ED on 08/20/2016. Cardiology has been asked to see the patient for evaluation of chest pain and need for further cardiac evaluation.  Mr. [name] says that about a week ago he developed intermittent feelings of "being drunk" with some lightheadedness and intermittent sharp left-sided chest pain associated with shortness of breath and lasting a few minutes at a time. This feeling was worse with activity and relieved with rest. No edema, orthopnea, palpitations or PND. He was given aspirin and SL NTG in the office and his discomfort resolved. He has had no further chest discomfort since admission.  Home medicines include aspirin 81 mg, Lipitor 40 mg, and losartan 50 mg.  He has no history of MI or cardiac interventions. Only previous cardiac testing includes EKG and echo. He has no family cardiac history. His mother died of stroke. He is an only child.  Troponins have been negative for 3 occurrences. Lipid panel with LDL of 49 mg/dL.   EKG showed sinus bradycardia with a rate of 49 bpm and no acute ischemic changes. PCP office notes of 2016 and 2017 indicate heart rates ranging from the 60s to the 80s. EKG in 12/2014 also showed sinus bradycardia at 57 bpm.  Last echo on 01/23/2015 indicated grade 1 diastolic  dysfunction, mild concentric hypertrophy, EF 65-70%, normal wall motion, mild AR and mild MR. Creatinine is 1.40 (08/21/16) and appears to be baseline as was 1.37 at PCP office visit on 09/17/2015.   Past Medical History   Past Medical History:  Diagnosis Date  . BPH (benign prostatic hyperplasia)   . Colon polyp 2010  . Depression   . GERD (gastroesophageal reflux disease)   . Hepatitis C    "caught it when I had a blood transfusion"  . High cholesterol   . History of blood transfusion    "when I was young"  . Hypertension   . Paranoid schizophrenia ([place])   . Prostate atrophy   . Retinal vein occlusion     Past Surgical History:  Procedure Laterality Date  . CIRCUMCISION  1959  . TONSILLECTOMY       Allergies  Allergies  Allergen Reactions  . Lisinopril Other (See Comments)    Cough  . Penicillins Hives    Inpatient Medications    . aspirin EC  81 mg Oral Daily  . atorvastatin  40 mg Oral q1800  . benztropine  1 mg Oral BID  . enoxaparin (LOVENOX) injection  40 mg Subcutaneous Q24H  . [START ON 08/22/2016] losartan  100 mg Oral Daily  . tamsulosin  0.8 mg Oral Daily    Family History    Family History  Problem Relation Age of Onset  . Hypertension Mother   . Diabetes Mother   . Stroke Mother     Social History  Social History   Social History  . Marital status: Married    Spouse name: N/A  . Number of children: N/A  . Years of education: N/A   Occupational History  . Not on file.   Social History Main Topics  . Smoking status: Current Every Day Smoker    Packs/day: 0.50    Years: 48.00    Types: Cigarettes  . Smokeless tobacco: Never Used  . Alcohol use No  . Drug use: No  . Sexual activity: Yes   Other Topics Concern  . Not on file   Social History Narrative  . No narrative on file     Review of Systems    General:  No chills, fever, night sweats or weight changes.  Cardiovascular:  No edema, orthopnea, palpitations, paroxysmal  nocturnal dyspnea.Positive for chest pain as above Dermatological: No rash, lesions/masses Respiratory: No cough, dyspnea Urologic: No hematuria, dysuria Abdominal:   No nausea, vomiting, diarrhea, bright red blood per rectum, melena, or hematemesis Neurologic:  No visual changes, wkns, changes in mental status. All other systems reviewed and are otherwise negative except as noted above.  Physical Exam    Blood pressure (!) 159/83, pulse (!) 57, temperature 98.1 F (36.7 C), temperature source Oral, resp. rate 18, height 5\' 9"  (1.753 m), weight 176 lb 8 oz (80.1 kg), SpO2 100 %.  General: Pleasant AA male, NAD Psych: Normal affect. Neuro: Alert and oriented X 3. Moves all extremities spontaneously. HEENT: Normal  Neck: Supple without bruits or JVD. Lungs:  Resp regular and unlabored, CTA, no wheezes or rales Heart: RRR no s3, s4, or murmurs. Abdomen: Soft, non-tender, non-distended, BS + x 4.  Extremities: No clubbing, cyanosis or edema. DP/PT/Radials 2+ and equal bilaterally.  Labs    Troponin [facility] of Care Test)  Recent Labs  08/20/16 1630  TROPIPOC 0.02    Recent Labs  08/20/16 2052 08/20/16 2308 08/21/16 0210  TROPONINI <0.03 <0.03 <0.03   Lab Results  Component Value Date   WBC 7.6 08/21/2016   HGB 13.9 08/21/2016   HCT 39.7 08/21/2016   MCV 86.3 08/21/2016   PLT 115 (L) 08/21/2016     Recent Labs Lab 08/20/16 1626 08/21/16 0210  NA 141 143  K 4.7 4.3  CL 104 109  CO2 31 25  BUN 5* 16  CREATININE 1.45* 1.40*  CALCIUM 8.9 8.7*  PROT 7.1  --   BILITOT 0.7  --   ALKPHOS 56  --   ALT 21  --   AST 33  --   GLUCOSE 89 103*   Lab Results  Component Value Date   CHOL 96 08/21/2016   HDL 37 (L) 08/21/2016   LDLCALC 49 08/21/2016   TRIG 50 08/21/2016   No results found for: [facility]   Radiology Studies    Dg Chest 2 View  Result Date: 08/20/2016 CLINICAL DATA:  Upper left thorax pain. EXAM: CHEST  2 VIEW COMPARISON:  09/09/2012 and priors.  FINDINGS: Cardiomediastinal silhouette is normal. Mediastinal contours appear intact. The aorta is torturous with a suggestion of fusiform dilation of the ascending aorta. There is no evidence of focal airspace consolidation, pleural effusion or pneumothorax. Osseous structures are without acute abnormality, however the thoracic vertebral bodies are poorly visualized on the lateral view. Soft tissues are grossly normal. IMPRESSION: Torturous aorta with suggestion of a fusiform dilation of the ascending aorta. Further evaluation with CTA of the chest may be considered to exclude vascular abnormality. Electronically Signed  By: Fidela Salisbury M.D.   On: 08/20/2016 16:44   Ct Angio Chest/abd/pel For Dissection W And/or Wo Contrast  Result Date: 08/20/2016 CLINICAL DATA:  Substernal chest pain, shortness of breath with exertion. Uncontrolled hypertension. EXAM: CT ANGIOGRAPHY CHEST, ABDOMEN AND PELVIS TECHNIQUE: Multidetector CT imaging through the chest, abdomen and pelvis was performed using the standard protocol during bolus administration of intravenous contrast. Multiplanar reconstructed images and MIPs were obtained and reviewed to evaluate the vascular anatomy. CONTRAST:  100 cc Isovue 370 IV COMPARISON:  Chest x-ray today. FINDINGS: CTA CHEST FINDINGS Cardiovascular: No filling defects in the pulmonary arteries to suggest pulmonary emboli. Heart is upper limits normal in size. Aorta is normal caliber. No dissection. Mediastinum/Nodes: No mediastinal, hilar, or axillary adenopathy. Lungs/Pleura: Dependent ground-glass opacities, likely dependent atelectasis. Lungs otherwise clear. No effusions. Musculoskeletal: No acute bony abnormality or focal bone lesion. Review of the MIP images confirms the above findings. CTA ABDOMEN AND PELVIS FINDINGS VASCULAR Aorta: Normal caliber.  No dissection. Celiac: Widely patent SMA: Widely patent Renals: Single bilaterally, widely patent. IMA: Widely patent Inflow:  Calcifications in the distal aorta and common iliac arteries. No aneurysm or dissection. No stenosis. Veins: Grossly unremarkable. Review of the MIP images confirms the above findings. NON-VASCULAR Hepatobiliary: No focal hepatic abnormality. Gallbladder unremarkable. Pancreas: No focal abnormality or ductal dilatation. Spleen: No focal abnormality.  Normal size. Adrenals/Urinary Tract: No adrenal abnormality. No focal renal abnormality. No stones or hydronephrosis. Urinary bladder is unremarkable. Stomach/Bowel: Appendix is normal. Stomach, large and small bowel grossly unremarkable. Lymphatic: No adenopathy. Reproductive:  No visible focal abnormality. Other: No free fluid or free air. Musculoskeletal: No acute bony abnormality or focal bone lesion. Review of the MIP images confirms the above findings. IMPRESSION: No evidence of aortic aneurysm or dissection. Mild atherosclerotic disease in the distal abdominal aorta and common iliac arteries. No evidence of pulmonary embolus. No acute findings in the chest, abdomen or pelvis. Electronically Signed   By: Rolm Baptise M.D.   On: 08/20/2016 18:19    EKG & Cardiac Imaging    EKG: 08/20/16 Sinus bradycardia at 49 bpm was no acute ischemic changes 1/5/201 sinus bradycardia at 56 bpm with  non-specific T wave abnormality laterally  Echocardiogram:   Previous echo on 01/23/2015 Study Conclusions - Left ventricle: The cavity size was normal. There was mild   concentric hypertrophy. Systolic function was vigorous. The   estimated ejection fraction was in the range of 65% to 70%. Wall   motion was normal; there were no regional wall motion   abnormalities. Doppler parameters are consistent with abnormal   left ventricular relaxation (grade 1 diastolic dysfunction).   There was no evidence of elevated ventricular filling pressure by   Doppler parameters. - Aortic valve: There was mild regurgitation. - Aortic root: The aortic root was normal in size. -  Mitral valve: Structurally normal valve. - Left atrium: The atrium was normal in size. - Right ventricle: The cavity size was normal. Wall thickness was   normal. Systolic function was normal. - Right atrium: The atrium was normal in size. - Tricuspid valve: There was mild regurgitation. - Pulmonary arteries: Systolic pressure was within the normal   range. - Inferior vena cava: The vessel was normal in size. - Pericardium, extracardiac: There was no pericardial effusion.    Assessment & Plan    1. Chest pain -Intermittent left-sided sharp chest pain, non-radiating, associated with shortness of breath and related to activity. This pain has resolved and the  patient thinks that it was related to high blood pressure (was 208/102 on presentation to PCP office yesterday) -Troponins have been negative for 3 occurrences, EKG without ischemic changes -Chest x-ray showed suggestion of dilated ascending aorta, however, CTA showed normal caliber aorta without dissection and no pulmonary embolism -Home medications include aspirin 81 mg, Lipitor 40 mg, losartan 50 mg -Patient has cardiac risk factors of hypertension, hyperlipidemia and tobacco use. Nuclear stress test would be valuable for further ischemic workup. Creatinine is 1.40 which appears to be his baseline -Plan exercise nuclear stress test tomorrow -Will get echo to assess cardiac function and valves.  2. Uncontrolled Hypertension -BP on presentation to the office yesterday was 208/102 -Treated at home with losartan 50 mg daily and increased during this admission to 100 mg daily as blood pressures have been running in the 150's-170's/80's-90s. He does not check BP at home. -Kidney function is at baseline with creatinine of 1.40, was 1.37 at PCP office visit on 09/17/2015. -Will continue to monitor -Consider adding amlodipine   3. Diastolic dysfunction -Per echocardiogram in Q000111Q, grade 1 diastolic dysfunction and normal EF -No signs  of fluid overload  4. Bradycardia -EKG showed sinus bradycardia with a rate of 49 beats per minute, EKG from 12/2014 also showed sinus bradycardia at 57 bpm, so this is not new -Telemetry shows sinus bradycardia in the 50s and 60s. He had a 2 minute run of sinus tachycardia with rate of 1:30 BPM at 04:44 this morning -Patient not on  Beta blocker or rate limiting medications   5. Dyslipidemia -Continue home Lipitor 40 mg daily -Lipid panel of 08/21/16 shows total cholesterol 96 mg/dL and LDL 49 mg/dL  6. Tobacco use -1/2 PPD smoker -Advise cessation. Patient has quit one time in the past and he is urged that he has a high probability of success again   Tildon Husky, NP-C 08/21/2016, 1:44 PM Pager: [phone]   Patient seen and examined. Agree with assessment and plan.  Mr. Eissa Rostad is a 66 year old African-American male (his birthday was yesterday), who has a history of hypertension, hyperlipidemia, paranoid schizophrenia, BPH, and has a long-standing tobacco history of approximately one half pack per day for close to 50 years.  He recently developed somewhat sharp chest pain and denies specific typical exertional symptomatology.  He was seen by his primary physician yesterday and he was significantly hypertensive and with his chest pain hospitalization was recommended.  Upon presentation, he was significantly hypertensive.  His dose of losartan has been increased from 50 mg to 100 mg.  Blood pressure today was improved but still elevated at 159/83.  The patient has been documented to have sinus bradycardia on prior evaluations.  On chest x-ray, there was a suggestion of a tortuous aorta with fusiform dilation of the ascending aorta.  He underwent a CT Angio , which did not reveal evidence for an aortic aneurysm or dissection.  He was noted to have mild atherosclerosis involving the distal abdominal aorta and common iliac arteries.  The study was negative for pulmonary embolism.  On  physical exam presently, his blood pressure is elevated at 159/83.  His pulse is 57.  There are no carotid bruits.  JVD was normal.  His lungs were clear.  Rhythm was regular.  There was a 1 to 2/6 systolic murmur at the apex.  There was no S3 gallop.  Abdomen was nontender without bruits.  Distal pulses are 2+.  There was no edema.  Negative Homans sign.  His ECG today shows predominant sinus rhythm, although there appears to be 2 beats of a low atrial rhythm.  He was bradycardic at 57 bpm.  There are nonspecific T changes in leads V5 and V6.  The patient was losartan dose has just been increased to 100 mg daily.  If his blood pressure continues to remain elevated, I would add amlodipine 5 mg to his current regimen.  I recommend a 2-D echo Doppler study be done to reassess systolic and diastolic function and cardiac murmur.  I suspect his chest pain is nonischemic but with his cardiac risk factor profile including hypertension, long-standing tobacco history, and hyperlipidemia I would recommend an exercise Myoview study for risk stratification.  We will keep the patient nothing by mouth in the morning with plans to perform this exercise nuclear perfusion study tomorrow.   Troy Sine, MD, [facility] 08/21/2016 2:42 PM

## 2016-08-21 NOTE — Progress Notes (Signed)
Paged MD clarify second cozaar order 50mg  at 1100, pt received dose at 1000. MD stated to give dose and keep pt NPO.  Jasmyne Lodato Leory Plowman

## 2016-08-22 ENCOUNTER — Observation Stay (HOSPITAL_BASED_OUTPATIENT_CLINIC_OR_DEPARTMENT_OTHER): Payer: Medicare Other

## 2016-08-22 DIAGNOSIS — R079 Chest pain, unspecified: Secondary | ICD-10-CM | POA: Diagnosis not present

## 2016-08-22 DIAGNOSIS — I200 Unstable angina: Secondary | ICD-10-CM

## 2016-08-22 DIAGNOSIS — R072 Precordial pain: Secondary | ICD-10-CM | POA: Diagnosis not present

## 2016-08-22 LAB — CBC
HCT: 41.1 % (ref 39.0–52.0)
Hemoglobin: 14.2 g/dL (ref 13.0–17.0)
MCH: 29.9 pg (ref 26.0–34.0)
MCHC: 34.5 g/dL (ref 30.0–36.0)
MCV: 86.5 fL (ref 78.0–100.0)
Platelets: 114 10*3/uL — ABNORMAL LOW (ref 150–400)
RBC: 4.75 MIL/uL (ref 4.22–5.81)
RDW: 14.5 % (ref 11.5–15.5)
WBC: 8.5 10*3/uL (ref 4.0–10.5)

## 2016-08-22 LAB — BASIC METABOLIC PANEL
Anion gap: 9 (ref 5–15)
BUN: 17 mg/dL (ref 6–20)
CO2: 26 mmol/L (ref 22–32)
Calcium: 8.9 mg/dL (ref 8.9–10.3)
Chloride: 106 mmol/L (ref 101–111)
Creatinine, Ser: 1.35 mg/dL — ABNORMAL HIGH (ref 0.61–1.24)
GFR calc Af Amer: >60 mL/min (ref >60)
GFR calc non Af Amer: 54 mL/min — ABNORMAL LOW (ref >60)
Glucose, Bld: 99 mg/dL (ref 65–99)
Potassium: 4.1 mmol/L (ref 3.5–5.1)
Sodium: 141 mmol/L (ref 135–145)

## 2016-08-22 LAB — NM MYOCAR MULTI W/SPECT W/WALL MOTION / EF
Estimated workload: 6.4 METS
Exercise duration (min): 4 min
Exercise duration (sec): 30 s
Peak HR: 139 {beats}/min
Percent HR: 89 %
Rest HR: 58 {beats}/min

## 2016-08-22 LAB — HEMOGLOBIN A1C
Hgb A1c MFr Bld: 5.4 % (ref 4.8–5.6)
Mean Plasma Glucose: 108 mg/dL

## 2016-08-22 LAB — ECHOCARDIOGRAM COMPLETE
Height: 69 in
Weight: 2824 oz

## 2016-08-22 MED ORDER — AMLODIPINE BESYLATE 10 MG PO TABS: 10.0000 mg | ORAL_TABLET | Freq: Every day | ORAL | Status: DC

## 2016-08-22 MED ORDER — HYDRALAZINE HCL 20 MG/ML IJ SOLN: 5.0000 mg | INTRAMUSCULAR | Status: DC | PRN

## 2016-08-22 MED ORDER — TECHNETIUM TC 99M TETROFOSMIN IV KIT
10.0000 | PACK | Freq: Once | INTRAVENOUS | Status: AC | PRN
  Administered 2016-08-22: 10 via INTRAVENOUS

## 2016-08-22 MED ORDER — AMLODIPINE BESYLATE 10 MG PO TABS: 10.0000 mg | ORAL_TABLET | Freq: Every day | ORAL | 0 refills | Status: DC

## 2016-08-22 MED ORDER — LOSARTAN POTASSIUM 100 MG PO TABS: 100.0000 mg | ORAL_TABLET | Freq: Every day | ORAL | 0 refills | Status: DC

## 2016-08-22 MED ORDER — TECHNETIUM TC 99M TETROFOSMIN IV KIT
30.0000 | PACK | Freq: Once | INTRAVENOUS | Status: AC | PRN
  Administered 2016-08-22: 30 via INTRAVENOUS

## 2016-08-22 NOTE — Progress Notes (Signed)
Reviewed discharge instructions with patient and his wife, verbalized understanding.  Went over patients follow-up appointments with patient as well.  Patient transported via wheelchair  to bus stop here at [facility] to await bus transportation home.

## 2016-08-22 NOTE — Clinical Social Work Note (Signed)
CSW notified pt is discharging and needs transport. CSW provided pt with bus pass to get home. Pt has no other needs at this time.  CSW is signing off.  Raeven Pint B. Joline Maxcy Clinical Social Work Dept Weekend Social Worker [phone] 3:58 PM

## 2016-08-22 NOTE — Progress Notes (Signed)
Patient Name: [name] Date of Encounter: 08/22/2016  Primary Cardiologist: Dr [facility] Problem List     Principal Problem:   Chest pain Active Problems:   HYPERTENSION, BENIGN ESSENTIAL   Tobacco abuse     Subjective   No chest pain or dyspnea  Inpatient Medications    Scheduled Meds: . amLODipine  5 mg Oral Daily  . aspirin EC  81 mg Oral Daily  . atorvastatin  40 mg Oral q1800  . benztropine  1 mg Oral BID  . enoxaparin (LOVENOX) injection  40 mg Subcutaneous Q24H  . losartan  100 mg Oral Daily  . tamsulosin  0.8 mg Oral Daily   Continuous Infusions:  PRN Meds: acetaminophen, hydrALAZINE, hydrOXYzine, ondansetron (ZOFRAN) IV   Vital Signs    Vitals:   08/22/16 0918 08/22/16 0920 08/22/16 0922 08/22/16 1028  BP: (!) 202/77 (!) 193/90 (!) 176/83 (!) 171/92  Pulse:      Resp:      Temp:      TempSrc:      SpO2:    100%  Weight:      Height:        Intake/Output Summary (Last 24 hours) at 08/22/16 1140 Last data filed at 08/22/16 0600  Gross per 24 hour  Intake              480 ml  Output             1025 ml  Net             -545 ml   Filed Weights   08/20/16 1606 08/20/16 2017 08/21/16 0503  Weight: 187 lb (84.8 kg) 177 lb 8 oz (80.5 kg) 176 lb 8 oz (80.1 kg)    Physical Exam    GEN: Well nourished, well developed, in no acute distress.  HEENT: Grossly normal.  Neck: Supple Cardiac: RRR Respiratory:  CTA GI: Soft, nontender, nondistended. MS: no deformity or atrophy. Skin: warm and dry, no rash. Neuro:  Strength and sensation are intact. Psych: AAOx3.  Normal affect.  Labs    CBC  Recent Labs  08/21/16 0210 08/22/16 0539  WBC 7.6 8.5  HGB 13.9 14.2  HCT 39.7 41.1  MCV 86.3 86.5  PLT 115* [id number]*   Basic Metabolic Panel  Recent Labs  08/21/16 0210 08/22/16 0539  NA 143 141  K 4.3 4.1  CL 109 106  CO2 25 26  GLUCOSE 103* 99  BUN 16 17  CREATININE 1.40* 1.35*  CALCIUM 8.7* 8.9   Liver Function  Tests  Recent Labs  08/20/16 1626  AST 33  ALT 21  ALKPHOS 56  BILITOT 0.7  PROT 7.1  ALBUMIN 4.1   Cardiac Enzymes  Recent Labs  08/20/16 2052 08/20/16 2308 08/21/16 0210  TROPONINI <0.03 <0.03 <0.03   Hemoglobin A1C  Recent Labs  08/21/16 1059  HGBA1C 5.4   Fasting Lipid Panel  Recent Labs  08/21/16 1059  CHOL 96  HDL 37*  LDLCALC 49  TRIG 50  CHOLHDL 2.6   Thyroid Function Tests  Recent Labs  08/21/16 1059  TSH 0.856    Telemetry    Sinus with brief SVT - Personally Reviewed    Radiology    Dg Chest 2 View  Result Date: 08/20/2016 CLINICAL DATA:  Upper left thorax pain. EXAM: CHEST  2 VIEW COMPARISON:  09/09/2012 and priors. FINDINGS: Cardiomediastinal silhouette is normal. Mediastinal contours appear intact. The aorta is torturous with a  suggestion of fusiform dilation of the ascending aorta. There is no evidence of focal airspace consolidation, pleural effusion or pneumothorax. Osseous structures are without acute abnormality, however the thoracic vertebral bodies are poorly visualized on the lateral view. Soft tissues are grossly normal. IMPRESSION: Torturous aorta with suggestion of a fusiform dilation of the ascending aorta. Further evaluation with CTA of the chest may be considered to exclude vascular abnormality. Electronically Signed   By: Fidela Salisbury M.D.   On: 08/20/2016 16:44   Ct Angio Chest/abd/pel For Dissection W And/or Wo Contrast  Result Date: 08/20/2016 CLINICAL DATA:  Substernal chest pain, shortness of breath with exertion. Uncontrolled hypertension. EXAM: CT ANGIOGRAPHY CHEST, ABDOMEN AND PELVIS TECHNIQUE: Multidetector CT imaging through the chest, abdomen and pelvis was performed using the standard protocol during bolus administration of intravenous contrast. Multiplanar reconstructed images and MIPs were obtained and reviewed to evaluate the vascular anatomy. CONTRAST:  100 cc Isovue 370 IV COMPARISON:  Chest x-ray today.  FINDINGS: CTA CHEST FINDINGS Cardiovascular: No filling defects in the pulmonary arteries to suggest pulmonary emboli. Heart is upper limits normal in size. Aorta is normal caliber. No dissection. Mediastinum/Nodes: No mediastinal, hilar, or axillary adenopathy. Lungs/Pleura: Dependent ground-glass opacities, likely dependent atelectasis. Lungs otherwise clear. No effusions. Musculoskeletal: No acute bony abnormality or focal bone lesion. Review of the MIP images confirms the above findings. CTA ABDOMEN AND PELVIS FINDINGS VASCULAR Aorta: Normal caliber.  No dissection. Celiac: Widely patent SMA: Widely patent Renals: Single bilaterally, widely patent. IMA: Widely patent Inflow: Calcifications in the distal aorta and common iliac arteries. No aneurysm or dissection. No stenosis. Veins: Grossly unremarkable. Review of the MIP images confirms the above findings. NON-VASCULAR Hepatobiliary: No focal hepatic abnormality. Gallbladder unremarkable. Pancreas: No focal abnormality or ductal dilatation. Spleen: No focal abnormality.  Normal size. Adrenals/Urinary Tract: No adrenal abnormality. No focal renal abnormality. No stones or hydronephrosis. Urinary bladder is unremarkable. Stomach/Bowel: Appendix is normal. Stomach, large and small bowel grossly unremarkable. Lymphatic: No adenopathy. Reproductive:  No visible focal abnormality. Other: No free fluid or free air. Musculoskeletal: No acute bony abnormality or focal bone lesion. Review of the MIP images confirms the above findings. IMPRESSION: No evidence of aortic aneurysm or dissection. Mild atherosclerotic disease in the distal abdominal aorta and common iliac arteries. No evidence of pulmonary embolus. No acute findings in the chest, abdomen or pelvis. Electronically Signed   By: Rolm Baptise M.D.   On: 08/20/2016 18:19     Patient Profile     66 year old male with hypertension, hyperlipidemia, tobacco abuse, schizophrenia with chest pain. Enzymes  negative.  Assessment & Plan    1 chest pain-patient ruled out. Await results of nuclear study. If negative he may be discharged and follow-up with Dr. Claiborne Billings. Echocardiogram could be performed as an outpatient.  2 tobacco abuse-patient counseled on discontinuing.  3 hypertension-blood pressure remains elevated. Increase amlodipine to 10 mg daily.  Signed, Kirk Ruths, MD  08/22/2016, 11:40 AM

## 2016-08-22 NOTE — Progress Notes (Signed)
*  PRELIMINARY RESULTS* Echocardiogram 2D Echocardiogram has been performed.  [name] 08/22/2016, 1:43 PM

## 2016-08-22 NOTE — Progress Notes (Signed)
GXT Myoview completed- final report pending.  Kerin Ransom PA-C 08/22/2016 9:21 AM

## 2016-08-22 NOTE — Progress Notes (Signed)
Per [place], [place] stress test negative, text page to family medicine to see if patient is being discharged. Patient is pacing the unit repeatedly waiting to find out if he is being discharged.

## 2016-08-22 NOTE — Discharge Summary (Signed)
[facility] Discharge Summary  Patient name: [name] Medical record number: [id number] Date of birth: 1951-01-08 Age: 66 y.o. Gender: male Date of Admission: 08/20/2016  Date of Discharge: 08/22/16 Admitting Physician: Zenia Resides, MD  Primary Care Provider: Lavon Paganini, MD Consultants: Cardiology  Indication for Hospitalization: Chest pain  Discharge Diagnoses/Problem List:  Patient Active Problem List   Diagnosis Date Noted  . Tobacco abuse   . Chest pain 08/20/2016  . Pain in gums 02/28/2016  . Onychomycosis 01/17/2016  . Diastolic heart failure ([place]) 02/05/2015  . Rash and nonspecific skin eruption 01/03/2015  . Healthcare maintenance 05/28/2014  . Hyperlipidemia 03/10/2014  . Neck pain 05/12/2013  . Patellar tendonitis 11/16/2012  . [place] OCCLUSION 06/03/2010  . PARANOID SCHIZOPHRENIA, CHRONIC 01/17/2009  . Tobacco abuse counseling 01/17/2009  . HYPERTENSION, BENIGN ESSENTIAL 01/17/2009  . BPH (benign prostatic hyperplasia) 01/17/2009  . HEPATITIS C 12/14/2008  . DEPRESSION 12/14/2008    Disposition: Home  Discharge Condition: Stable  Discharge Exam:  GEN: appears well, walking in the room, no apparent distress. CVS: RRR, nl s1 & s2, no murmurs, no edema RESP: no increased work of breathing, good air movement bilaterally, no rhonchi, crackles or wheeze GI: Bowel sounds present and normal, soft, non-tender, non-distended MSK: No focal tenderness or swelling SKIN: no apparent skin lesion ENDO: negative thyromegally NEURO: alert and oiented appropriately, no gross defecits  PSYCH: euthymic mood with congruent affect Brief Hospital Course:  Assessment and Plan: [name] is a 66 y.o. male presenting with chest pain . PMH is significant for diastolic heart failure, HTN, HLD, schizophrenia, hepatitis C, BPH.  New-onset  typical chest pain: Resolved. ACS ruled out by serial troponin  & EKG. Cardiology was  consulted due to HEART score 5 (risk factors include, typical chest pain, age, tobacco use and HTN).  Myoview was done and negative for reversible ischemia. Echo 08/22/2016 with EF 55-60%, moderate LVH, G1DD. Not able to evaluate ASCVD risk score due to low cholesterol to 96.  Discharged home on Lipitor 40 mg and aspirin. Patient to call Dr. Horace Porteous office (cardiology) for follow-up.   HTN: Increased his losartan from 50 mg to 100 mg on 08/21/2016. Added amlodipine 10 mg daily on 08/22/2016 per cardiology recommendation for good blood pressure control. Recommend blood pressure check and BMP on follow-up.   Other chronic medical conditions stable  Issues for Follow Up:  1. Hypertension: Check BP and BMP 2. Ensure patient has a follow-up set up with cardiology.   Significant Procedures:  -Myoview: Negative -Echocardiogram: See above  Significant Labs and Imaging:   Recent Labs Lab 08/20/16 1626 08/21/16 0210 08/22/16 0539  WBC 6.7 7.6 8.5  HGB 14.5 13.9 14.2  HCT 41.5 39.7 41.1  PLT 124* 115* 114*    Recent Labs Lab 08/20/16 1626 08/21/16 0210 08/22/16 0539  NA 141 143 141  K 4.7 4.3 4.1  CL 104 109 106  CO2 31 25 26   GLUCOSE 89 103* 99  BUN 5* 16 17  CREATININE 1.45* 1.40* 1.35*  CALCIUM 8.9 8.7* 8.9  ALKPHOS 56  --   --   AST 33  --   --   ALT 21  --   --   ALBUMIN 4.1  --   --     Results/Tests Pending at Time of Discharge: None  Discharge Medications:  Allergies as of 08/22/2016      Reactions   Lisinopril Other (See Comments)  Cough   Penicillins Hives      Medication List    STOP taking these medications   ibuprofen 600 MG tablet Commonly known as:  ADVIL,MOTRIN     TAKE these medications   amLODipine 10 MG tablet Commonly known as:  NORVASC Take 1 tablet (10 mg total) by mouth daily. Start taking on:  08/23/2016   aspirin 81 MG tablet Take 1 tablet (81 mg total) by mouth daily.   atorvastatin 40 MG tablet Commonly known as:   LIPITOR Take 1 tablet (40 mg total) by mouth daily.   benztropine 1 MG tablet Commonly known as:  COGENTIN Take 1 tablet (1 mg total) by mouth 2 (two) times daily.   cetirizine 10 MG tablet Commonly known as:  ZYRTEC take 1 tablet by mouth once daily   diclofenac sodium 1 % Gel Commonly known as:  VOLTAREN Apply to L knee QID prn pain What changed:  how much to take  how to take this  when to take this  reasons to take this  additional instructions   fluPHENAZine decanoate 25 MG/ML injection Commonly known as:  PROLIXIN Inject 25 mg into the muscle every 14 (fourteen) days. Last dose 09/07/12   guaiFENesin 100 MG/5ML liquid Commonly known as:  ROBITUSSIN Take 5-10 mLs (100-200 mg total) by mouth every 4 (four) hours as needed for cough.   losartan 100 MG tablet Commonly known as:  COZAAR Take 1 tablet (100 mg total) by mouth daily. Start taking on:  08/23/2016 What changed:  medication strength  how much to take   multivitamin with minerals Tabs tablet Take 1 tablet by mouth daily.   tamsulosin 0.4 MG Caps capsule Commonly known as:  FLOMAX Take 2 capsules (0.8 mg total) by mouth daily.   traMADol 50 MG tablet Commonly known as:  ULTRAM Take 1 tablet (50 mg total) by mouth every 6 (six) hours as needed.   triamcinolone cream 0.1 % Commonly known as:  KENALOG Apply 1 application topically 2 (two) times daily. For 5 days, then twice daily as needed for rash and itching.   vitamin C 500 MG tablet Commonly known as:  ASCORBIC ACID Take 500 mg by mouth daily.       Discharge Instructions: Please refer to Patient Instructions section of EMR for full details.  Patient was counseled important signs and symptoms that should prompt return to medical care, changes in medications, dietary instructions, activity restrictions, and follow up appointments.   Follow-Up Appointments: Follow-up Information    Corey Majestic, MD. Call on 08/27/2016.   Specialty:   Cardiology Contact information: [address] [place] [address] [phone]        Georges Lynch, MD. Go on 08/27/2016.   Specialty:  Family Medicine Why:  Your appointment is at 2 PM. Arrive 30 minutes early Contact information: [id number] [address] [phone]           Mercy Riding, MD 08/22/2016, 10:34 PM PGY-2, [place]

## 2016-08-22 NOTE — Discharge Instructions (Signed)
It has been a pleasure taking care of you! You were admitted due to chest pain that has resolved. The testss we have done so for doesn't suggest a chest pain is related to your heart. So, we think it is safe to let you go home and follow-up with the heart doctors and your primary care. There could be some changes made to your home medications during this hospitalization. Please, make sure to read the directions before you take them. The names and directions on how to take these medications are found on this discharge paper under medication section.  Please follow up at your primary care doctor's office. The address, date and time are found on the discharge paper under follow up section.  Take care,

## 2016-08-25 ENCOUNTER — Telehealth: Payer: Self-pay | Admitting: Cardiovascular Disease

## 2016-08-25 NOTE — Telephone Encounter (Signed)
Closed encounter °

## 2016-08-27 ENCOUNTER — Encounter: Payer: Self-pay | Admitting: Family Medicine

## 2016-08-27 ENCOUNTER — Ambulatory Visit (INDEPENDENT_AMBULATORY_CARE_PROVIDER_SITE_OTHER): Payer: Medicare Other | Admitting: Family Medicine

## 2016-08-27 VITALS — BP 138/82 | HR 68 | Temp 97.8°F | Ht 69.0 in | Wt 182.0 lb

## 2016-08-27 DIAGNOSIS — R072 Precordial pain: Secondary | ICD-10-CM

## 2016-08-27 DIAGNOSIS — Z79899 Other long term (current) drug therapy: Secondary | ICD-10-CM | POA: Diagnosis not present

## 2016-08-27 DIAGNOSIS — K7469 Other cirrhosis of liver: Secondary | ICD-10-CM | POA: Diagnosis not present

## 2016-08-27 NOTE — Assessment & Plan Note (Signed)
Patient is here for hospital follow-up for chest pain and hypertension. Chest pain is mostly resolved but he does endorse an occasional episode of "tingling" along the inferior aspect of his sternum. No significant shortness of breath, nausea, arm pain, or diaphoresis associated with this symptom. Patient has follow-up visit with cardiology on 09/23/16. Endorses good compliance with his medication regimen and is able to relay the dosing of his antihypertensives back to me. - BMP obtained today. - Provided his cardiologist's clinics address and phone number. - Patient asked to follow-up with PCP in approximately 3 months.

## 2016-08-27 NOTE — Patient Instructions (Signed)
It was a pleasure seeing you today in our clinic. Today we discussed your chest pain and hospital follow-up. Here is the treatment plan we have discussed and agreed upon together:   - Continue taking your medications as prescribed. - Your blood pressure is currently well controlled on the current medication regimen we have redefined. Continue taking these medications every day. - You have an appointment with your cardiology on February 7. Make sure to make this appointment. I provided you a the address of your cardiologist's clinic.

## 2016-08-27 NOTE — Progress Notes (Signed)
   HPI  CC: Hospital follow-up Patient is here for hospital follow-up. He states that he has been well since discharge and has been very compliant with all of his medications. When asking about 6 continued chest pain he states that he occasionally has some "tingling" in the lower aspect of his sternum. He states that this only lasts for a few minutes and he has noticed that it occasionally happens more frequently when he has been walking for prolonged period of time. He denies any symptoms of shortness of breath, nausea, left arm or jaw numbness or pain, or diaphoresis during the episodes of chest "tingling".  Patient is able to accurately list his upper tension medication regimen. He is also aware that he has an appointment with his cardiologist on February 7. He is asking for the cardiologists clinics address which was provided at the end of today's visit.  Review of Systems    See HPI for ROS. All other systems reviewed and are negative.  CC, SH/smoking status, and VS noted  Objective: BP 138/82   Pulse 68   Temp 97.8 F (36.6 C) (Oral)   Ht 5\' 9"  (1.753 m)   Wt 182 lb (82.6 kg)   BMI 26.88 kg/m  Gen: NAD, alert, cooperative, and pleasant. CV: RRR, systolic murmur noted along the sternal border. Resp: CTAB, no wheezes, non-labored Ext: No edema, warm Neuro: Alert and oriented, Speech clear, No gross deficits  Assessment and plan:  Chest pain Patient is here for hospital follow-up for chest pain and hypertension. Chest pain is mostly resolved but he does endorse an occasional episode of "tingling" along the inferior aspect of his sternum. No significant shortness of breath, nausea, arm pain, or diaphoresis associated with this symptom. Patient has follow-up visit with cardiology on 09/23/16. Endorses good compliance with his medication regimen and is able to relay the dosing of his antihypertensives back to me. - BMP obtained today. - Provided his cardiologist's clinics address and  phone number. - Patient asked to follow-up with PCP in approximately 3 months.   Orders Placed This Encounter  Procedures  . BASIC METABOLIC PANEL WITH GFR    Elberta Leatherwood, MD,MS,  PGY3 08/27/2016 5:51 PM

## 2016-08-28 DIAGNOSIS — F209 Schizophrenia, unspecified: Secondary | ICD-10-CM | POA: Diagnosis not present

## 2016-08-28 LAB — BASIC METABOLIC PANEL WITH GFR
BUN: 13 mg/dL (ref 7–25)
CO2: 24 mmol/L (ref 20–31)
Calcium: 8.5 mg/dL — ABNORMAL LOW (ref 8.6–10.3)
Chloride: 107 mmol/L (ref 98–110)
Creat: 1.49 mg/dL — ABNORMAL HIGH (ref 0.70–1.25)
GFR, Est African American: 56 mL/min — ABNORMAL LOW (ref >=60)
GFR, Est Non African American: 49 mL/min — ABNORMAL LOW (ref >=60)
Glucose, Bld: 104 mg/dL — ABNORMAL HIGH (ref 65–99)
Potassium: 4.2 mmol/L (ref 3.5–5.3)
Sodium: 140 mmol/L (ref 135–146)

## 2016-09-08 DIAGNOSIS — F209 Schizophrenia, unspecified: Secondary | ICD-10-CM | POA: Diagnosis not present

## 2016-09-09 ENCOUNTER — Encounter: Payer: Self-pay | Admitting: Nurse Practitioner

## 2016-09-14 ENCOUNTER — Encounter: Payer: Self-pay | Admitting: Family Medicine

## 2016-09-14 ENCOUNTER — Ambulatory Visit (INDEPENDENT_AMBULATORY_CARE_PROVIDER_SITE_OTHER): Payer: Medicare Other | Admitting: Family Medicine

## 2016-09-14 VITALS — BP 150/100 | HR 63 | Temp 97.7°F | Ht 69.0 in | Wt 186.0 lb

## 2016-09-14 DIAGNOSIS — M546 Pain in thoracic spine: Secondary | ICD-10-CM

## 2016-09-14 DIAGNOSIS — M549 Dorsalgia, unspecified: Secondary | ICD-10-CM | POA: Insufficient documentation

## 2016-09-14 DIAGNOSIS — I10 Essential (primary) hypertension: Secondary | ICD-10-CM

## 2016-09-14 DIAGNOSIS — I200 Unstable angina: Secondary | ICD-10-CM | POA: Diagnosis not present

## 2016-09-14 MED ORDER — HYDROCHLOROTHIAZIDE 12.5 MG PO CAPS: 12.5000 mg | ORAL_CAPSULE | Freq: Every day | ORAL | 1 refills | Status: DC

## 2016-09-14 NOTE — Progress Notes (Signed)
   Subjective:   [name] is a 66 y.o. male with a history of HTN, diastolic heart failure, BPH, paranoid schizophrenia, HLD here for back pain  Low back pain - lasted for ~1 month and ribs were sore - resolved now - denies fevers, dysuria, hematuria, decreased UOP - no h/o kidney stones - worried about kidneys because of location of back pain - Was also having rear rib pain at this time  HTN: - Medications: amlodipine 10mg , losartan 100mg  daily - Compliance: Initially reports that is good, but then reports he is unsure if he is taking amlodipine - Checking BP at home: No - Denies any SOB, CP, vision changes, LE edema, medication SEs, or symptoms of hypotension - Diet: No table salt, but does eat canned food about 3 times weekly - Exercise: None currently   Review of Systems:  Per HPI.   Social History: Current smoker  Objective:  BP (!) 150/100   Pulse 63   Temp 97.7 F (36.5 C) (Oral)   Ht 5\' 9"  (1.753 m)   Wt 186 lb (84.4 kg)   SpO2 97%   BMI 27.47 kg/m   Gen:  66 y.o. male in NAD HEENT: NCAT, MMM, EOMI, PERRL, anicteric sclerae CV: RRR, no MRG Resp: Non-labored, CTAB, no wheezes noted Abd: Soft, NTND, BS present, no guarding or organomegaly, no CVA tenderness Ext: WWP, no edema MSK: Back: No tenderness palpation over midline, paraspinal muscles. No CVA tenderness. Strength and sensation intact in lower extremities. No tenderness over rib cage Neuro: Alert and oriented, speech normal       Chemistry      Component Value Date/Time   NA 140 08/27/2016 1412   K 4.2 08/27/2016 1412   CL 107 08/27/2016 1412   CO2 24 08/27/2016 1412   BUN 13 08/27/2016 1412   CREATININE 1.49 (H) 08/27/2016 1412      Component Value Date/Time   CALCIUM 8.5 (L) 08/27/2016 1412   ALKPHOS 56 08/20/2016 1626   AST 33 08/20/2016 1626   ALT 21 08/20/2016 1626   BILITOT 0.7 08/20/2016 1626      Lab Results  Component Value Date   WBC 8.5 08/22/2016   HGB 14.2 08/22/2016     HCT 41.1 08/22/2016   MCV 86.5 08/22/2016   PLT 114 (L) 08/22/2016   Lab Results  Component Value Date   TSH 0.856 08/21/2016   Lab Results  Component Value Date   HGBA1C 5.4 08/21/2016   Assessment & Plan:     ENAN OUTMAN is a 66 y.o. male here for   HYPERTENSION, BENIGN ESSENTIAL Uncontrolled today Initially prescribed HCTZ 12.5 mg daily to add to losartan 100 mg daily and amlodipine 10 mg daily, but later patient reports that he may not be taking the amlodipine daily Advised patient to start taking amlodipine or if already taking this to add HCTZ He is to call the clinic back and clarify what he is taking Follow-up in 2wks  Back pain No back pain currently No abnormalities on exam Reassured patient about his kidneys as this is an unlikely etiology given lack of urinary symptoms, lack of fever, no CVA tenderness, and recent creatinine at baseline   [name], MD MPH PGY-3,  [facility] 09/14/2016  3:54 PM

## 2016-09-14 NOTE — Patient Instructions (Signed)
Nice to see you again today. I'm glad that your back pain has gotten better. I don't think that this is related to your kidneys. It is good news that you're urinating well and her last blood work checking your kidneys was good.  Your blood pressure is elevated today. Continue your current losartan and amlodipine. We will start another medication called HCTZ 12.5 mg daily. I'll have you come back in 2 weeks to see how your blood pressure is doing and we will adjust further as necessary.  Take care, Dr. Jacinto Reap

## 2016-09-14 NOTE — Assessment & Plan Note (Signed)
No back pain currently No abnormalities on exam Reassured patient about his kidneys as this is an unlikely etiology given lack of urinary symptoms, lack of fever, no CVA tenderness, and recent creatinine at baseline

## 2016-09-14 NOTE — Assessment & Plan Note (Signed)
Uncontrolled today Initially prescribed HCTZ 12.5 mg daily to add to losartan 100 mg daily and amlodipine 10 mg daily, but later patient reports that he may not be taking the amlodipine daily Advised patient to start taking amlodipine or if already taking this to add HCTZ He is to call the clinic back and clarify what he is taking Follow-up in 2wks

## 2016-09-23 ENCOUNTER — Encounter: Payer: Self-pay | Admitting: Physician Assistant

## 2016-09-23 ENCOUNTER — Ambulatory Visit (INDEPENDENT_AMBULATORY_CARE_PROVIDER_SITE_OTHER): Payer: Medicare Other | Admitting: Physician Assistant

## 2016-09-23 VITALS — BP 151/96 | HR 63 | Ht 69.0 in | Wt 184.0 lb

## 2016-09-23 DIAGNOSIS — R079 Chest pain, unspecified: Secondary | ICD-10-CM | POA: Diagnosis not present

## 2016-09-23 DIAGNOSIS — I200 Unstable angina: Secondary | ICD-10-CM

## 2016-09-23 DIAGNOSIS — I10 Essential (primary) hypertension: Secondary | ICD-10-CM

## 2016-09-23 DIAGNOSIS — E785 Hyperlipidemia, unspecified: Secondary | ICD-10-CM | POA: Diagnosis not present

## 2016-09-23 DIAGNOSIS — F209 Schizophrenia, unspecified: Secondary | ICD-10-CM

## 2016-09-23 NOTE — Patient Instructions (Addendum)
Medication Instructions:   Your physician recommends that you continue on your current medications as directed. Please refer to the Current Medication list given to you today.   If you need a refill on your cardiac medications before your next appointment, please call your pharmacy.  Labwork: NONE ORDERED  TODAY    Testing/Procedures: NONE ORDERED  TODAY'   Follow-Up: IN 3 MONTHS WITH DR Claiborne Billings    Any Other Special Instructions Will Be Listed Below (If Applicable).

## 2016-09-23 NOTE — Progress Notes (Signed)
Cardiology Office Note    Date:  09/23/2016   ID:  Talley, Schlund [birth year], MRN [id number]  PCP:  Lavon Paganini, MD  Cardiologist:  Dr. Claiborne Billings   Chief Complaint  Patient presents with  . Hospitalization Follow-up    seen for Dr. Claiborne Billings, recently admitted for chest pain and malignant HTN    History of Present Illness:  [name] is a 66 y.o. male with grade 1 DD, HTN, HLD, Schizophrenia, hepatitis C, active smoker, and BPH who presented to his PCPs office on 08/20/2016 chest pain, blood pressure at the time was 208/102. His symptom was concerning for cardiac etiology and he was sent to [facility] for admission. Patient was admitted by internal medicine teaching service. Cardiology service was consulted. Prior to recent admission, he has no prior cardiac issues. Serial troponin was negative 3. EKG shows sinus bradycardia with heart rate 49, however no ischemic changes. Last echocardiogram in June 2016 showed grade 1 diastolic dysfunction, mild LVH, EF 65-70%. Repeat echocardiogram obtained on 08/22/2016 showed EF 0000000, grade 1 diastolic dysfunction, mild AI. Myoview obtained on the same day showed no reversible ischemia or infarction, EF 54%, overall low risk study. He was put on 10 mg amlodipine for blood pressure control.  He presents today for cardiology follow-up. He did not take his blood pressure medications this morning as he rushed out of home. His chest pain has completely resolved. Based on internal medicine's note, it looks like there is some confusion exactly which blood pressure medication he is taking. He says he is taking 2 out of the 3 blood pressure medications, however is unaware which of the two. He says his systolic blood pressure has been in the 130s recently. Otherwise, and appears he has upcoming's scheduled appointment with internal medicine service for further titration of his blood pressure medication. I think he is doing quite well from the cardiology  perspective. No additional workup is needed. He will follow-up with Dr. Claiborne Billings in 3 month.   Past Medical History:  Diagnosis Date  . BPH (benign prostatic hyperplasia)   . Colon polyp 2010  . Depression   . GERD (gastroesophageal reflux disease)   . Hepatitis C    "caught it when I had a blood transfusion"  . High cholesterol   . History of blood transfusion    "when I was young"  . Hypertension   . Paranoid schizophrenia ([place])   . Prostate atrophy   . Retinal vein occlusion     Past Surgical History:  Procedure Laterality Date  . CIRCUMCISION  1959  . TONSILLECTOMY      Current Medications: Outpatient Medications Prior to Visit  Medication Sig Dispense Refill  . amLODipine (NORVASC) 10 MG tablet Take 1 tablet (10 mg total) by mouth daily. 30 tablet 0  . aspirin 81 MG tablet Take 1 tablet (81 mg total) by mouth daily. 30 tablet 6  . atorvastatin (LIPITOR) 40 MG tablet Take 1 tablet (40 mg total) by mouth daily. 90 tablet 3  . benztropine (COGENTIN) 1 MG tablet Take 1 tablet (1 mg total) by mouth 2 (two) times daily. 60 tablet 6  . cetirizine (ZYRTEC) 10 MG tablet take 1 tablet by mouth once daily 30 tablet 6  . diclofenac sodium (VOLTAREN) 1 % GEL Apply to L knee QID prn pain (Patient taking differently: Apply 2 g topically 4 (four) times daily as needed (pain). Apply to L knee QID prn pain) 100 g 2  .  fluPHENAZine decanoate (PROLIXIN) 25 MG/ML injection Inject 25 mg into the muscle every 14 (fourteen) days. Last dose 09/07/12    . guaiFENesin (ROBITUSSIN) 100 MG/5ML liquid Take 5-10 mLs (100-200 mg total) by mouth every 4 (four) hours as needed for cough. 60 mL 0  . hydrochlorothiazide (MICROZIDE) 12.5 MG capsule Take 1 capsule (12.5 mg total) by mouth daily. 30 capsule 1  . losartan (COZAAR) 100 MG tablet Take 1 tablet (100 mg total) by mouth daily. 30 tablet 0  . Multiple Vitamin (MULTIVITAMIN WITH MINERALS) TABS tablet Take 1 tablet by mouth daily. 30 tablet 6  . tamsulosin  (FLOMAX) 0.4 MG CAPS capsule Take 2 capsules (0.8 mg total) by mouth daily. 60 capsule 11  . traMADol (ULTRAM) 50 MG tablet Take 1 tablet (50 mg total) by mouth every 6 (six) hours as needed. 10 tablet 0  . triamcinolone cream (KENALOG) 0.1 % Apply 1 application topically 2 (two) times daily. For 5 days, then twice daily as needed for rash and itching. 30 g 0  . vitamin C (ASCORBIC ACID) 500 MG tablet Take 500 mg by mouth daily.     No facility-administered medications prior to visit.      Allergies:   Lisinopril and Penicillins   Social History   Social History  . Marital status: Married    Spouse name: N/A  . Number of children: N/A  . Years of education: N/A   Social History Main Topics  . Smoking status: Current Every Day Smoker    Packs/day: 0.50    Years: 48.00    Types: Cigarettes  . Smokeless tobacco: Never Used  . Alcohol use No  . Drug use: No  . Sexual activity: Yes   Other Topics Concern  . None   Social History Narrative  . None     Family History:  The patient's family history includes Diabetes in his mother; Hypertension in his mother; Stroke in his mother.   ROS:   Please see the history of present illness.    ROS All other systems reviewed and are negative.   PHYSICAL EXAM:   VS:  BP (!) 151/96 (BP Location: Left Arm)   Pulse 63   Ht 5\' 9"  (1.753 m)   Wt 184 lb (83.5 kg)   BMI 27.17 kg/m    GEN: Well nourished, well developed, in no acute distress  HEENT: normal  Neck: no JVD, carotid bruits, or masses Cardiac: RRR; no murmurs, rubs, or gallops,no edema  Respiratory:  clear to auscultation bilaterally, normal work of breathing GI: soft, nontender, nondistended, + BS MS: no deformity or atrophy  Skin: warm and dry, no rash Neuro:  Alert and Oriented x 3, Strength and sensation are intact Psych: euthymic mood, full affect  Wt Readings from Last 3 Encounters:  09/23/16 184 lb (83.5 kg)  09/14/16 186 lb (84.4 kg)  08/27/16 182 lb (82.6 kg)        Studies/Labs Reviewed:   EKG:  EKG is not ordered today.    Recent Labs: 08/20/2016: ALT 21 08/21/2016: TSH 0.856 08/22/2016: Hemoglobin 14.2; Platelets 114 08/27/2016: BUN 13; Creat 1.49; Potassium 4.2; Sodium 140   Lipid Panel    Component Value Date/Time   CHOL 96 08/21/2016 1059   TRIG 50 08/21/2016 1059   HDL 37 (L) 08/21/2016 1059   CHOLHDL 2.6 08/21/2016 1059   VLDL 10 08/21/2016 1059   LDLCALC 49 08/21/2016 1059   LDLDIRECT 99 11/11/2011 1030    Additional studies/ records  that were reviewed today include:   Echo 08/22/2016 LV EF: 55% -   60%  - Left ventricle: The cavity size was normal. Wall thickness was   increased in a pattern of moderate LVH. Systolic function was   normal. The estimated ejection fraction was in the range of 55%   to 60%. Wall motion was normal; there were no regional wall   motion abnormalities. Doppler parameters are consistent with   abnormal left ventricular relaxation (grade 1 diastolic   dysfunction). - Aortic valve: There was mild regurgitation. - Mitral valve: Calcified annulus. - Left atrium: The atrium was mildly dilated.  Impressions:  - Normal LV systolic function; grade 1 diastolic dysfunction;   moderate LVH; sclerotic aortic valve with mild AI; mild LAE.    Myoview 08/22/2016 IMPRESSION: 1. No reversible ischemia or infarction.  2. Normal left ventricular wall motion.  3. Left ventricular ejection fraction 54%  4. Non invasive risk stratification*: Low   ASSESSMENT:    1. Chest pain, unspecified type   2. Essential hypertension   3. Hyperlipidemia, unspecified hyperlipidemia type   4. Schizophrenia, unspecified type ([place])      PLAN:  In order of problems listed above:  1. Chest pain: This has resolved, recent echocardiogram and Myoview reassuring. No further workup is needed.  2. Uncontrolled high blood pressure: Noted on echocardiogram to have moderate LVH, he will need to have blood pressure  under better control. His blood pressure is still elevated today, however he did not take any of his medications prior to office visit. He has upcoming follow-up with the internal medicine service for this.  3. Hyperlipidemia: On Lipitor 40 mg daily  4. Schizophrenia: Will defer to PCP.    Medication Adjustments/Labs and Tests Ordered: Current medicines are reviewed at length with the patient today.  Concerns regarding medicines are outlined above.  Medication changes, Labs and Tests ordered today are listed in the Patient Instructions below. Patient Instructions  Medication Instructions:   Your physician recommends that you continue on your current medications as directed. Please refer to the Current Medication list given to you today.   If you need a refill on your cardiac medications before your next appointment, please call your pharmacy.  Labwork: NONE ORDERED  TODAY    Testing/Procedures: NONE ORDERED  TODAY'   Follow-Up: IN 3 MONTHS WITH DR Claiborne Billings    Any Other Special Instructions Will Be Listed Below (If Applicable).                                                                                                                                                      Hilbert Corrigan, [place]  09/23/2016 12:27 PM    [facility] [address], [place], [place]  [address] Phone: [phone]; Fax: [phone]

## 2016-09-25 ENCOUNTER — Other Ambulatory Visit: Payer: Self-pay | Admitting: Family Medicine

## 2016-09-25 DIAGNOSIS — N400 Enlarged prostate without lower urinary tract symptoms: Secondary | ICD-10-CM

## 2016-09-28 ENCOUNTER — Ambulatory Visit: Payer: Medicare Other | Admitting: Family Medicine

## 2016-10-07 ENCOUNTER — Ambulatory Visit (INDEPENDENT_AMBULATORY_CARE_PROVIDER_SITE_OTHER): Payer: Medicare Other | Admitting: Family Medicine

## 2016-10-07 ENCOUNTER — Encounter: Payer: Self-pay | Admitting: Family Medicine

## 2016-10-07 VITALS — BP 119/75 | HR 78 | Temp 98.3°F | Ht 69.0 in | Wt 186.6 lb

## 2016-10-07 DIAGNOSIS — I10 Essential (primary) hypertension: Secondary | ICD-10-CM

## 2016-10-07 DIAGNOSIS — F209 Schizophrenia, unspecified: Secondary | ICD-10-CM | POA: Diagnosis not present

## 2016-10-07 DIAGNOSIS — I200 Unstable angina: Secondary | ICD-10-CM

## 2016-10-07 MED ORDER — BENZTROPINE MESYLATE 1 MG PO TABS
1.0000 mg | ORAL_TABLET | Freq: Two times a day (BID) | ORAL | 0 refills | Status: DC
Start: 2016-10-07 — End: 2016-11-05

## 2016-10-07 MED ORDER — LOSARTAN POTASSIUM 100 MG PO TABS: 100.0000 mg | ORAL_TABLET | Freq: Every day | ORAL | 5 refills | Status: DC

## 2016-10-07 NOTE — Progress Notes (Signed)
   Subjective:   [name] is a 66 y.o. male with a history of HTN, HFpEF, Hep C, HLD, schizophrenia, BPH here for HTN f/u   HTN: - Medications: Amlodipine 10 mg daily, losartan 100 mg daily - Compliance: good - Checking BP at home: no - Denies any SOB, CP, vision changes, LE edema, medication SEs, or symptoms of hypotension - Diet: no table salt, but does eat canned food about 3 times weekly - Exercise: none currently   Review of Systems:  Per HPI.   Social History: current smoker  Objective:  BP 119/75 (BP Location: Left Arm, Patient Position: Sitting, Cuff Size: Normal)   Pulse 78   Temp 98.3 F (36.8 C) (Oral)   Ht 5\' 9"  (1.753 m)   Wt 186 lb 9.6 oz (84.6 kg)   SpO2 99%   BMI 27.56 kg/m   Gen:  66 y.o. male in NAD  HEENT: NCAT, MMM, PERRL, anicteric sclerae CV: RRR, no MRG Resp: Non-labored, CTAB, no wheezes noted Ext: WWP, no edema MSK: No obvious deformities, gait intact Neuro: Alert and oriented, speech normal       Chemistry      Component Value Date/Time   NA 140 08/27/2016 1412   K 4.2 08/27/2016 1412   CL 107 08/27/2016 1412   CO2 24 08/27/2016 1412   BUN 13 08/27/2016 1412   CREATININE 1.49 (H) 08/27/2016 1412      Component Value Date/Time   CALCIUM 8.5 (L) 08/27/2016 1412   ALKPHOS 56 08/20/2016 1626   AST 33 08/20/2016 1626   ALT 21 08/20/2016 1626   BILITOT 0.7 08/20/2016 1626      Lab Results  Component Value Date   WBC 8.5 08/22/2016   HGB 14.2 08/22/2016   HCT 41.1 08/22/2016   MCV 86.5 08/22/2016   PLT 114 (L) 08/22/2016   Lab Results  Component Value Date   TSH 0.856 08/21/2016   Lab Results  Component Value Date   HGBA1C 5.4 08/21/2016   Assessment & Plan:     [name] is a 66 y.o. male here for   HYPERTENSION, BENIGN ESSENTIAL Well controlled Patient previously had not started amlodipine due to some confusion about medications Continue amlodipine and losartan F/u in 3 months   [name], MD  MP[facility] PGY-3,  [facility] 10/08/2016  9:56 AM

## 2016-10-07 NOTE — Patient Instructions (Signed)
Nice to see you again.  Your blood pressure is well controlled.  Continue amlodipine and losartan.  See you in 3 months for follow-up  Take care, Dr. Jacinto Reap

## 2016-10-08 ENCOUNTER — Telehealth: Payer: Self-pay | Admitting: *Deleted

## 2016-10-08 NOTE — Telephone Encounter (Signed)
Prior Authorization received from [facility] for Losartan 100 mg. PA approved via [employer] until 10/08/2017. Approval number: [id number]. Derl Barrow, RN

## 2016-10-08 NOTE — Assessment & Plan Note (Signed)
Well controlled Patient previously had not started amlodipine due to some confusion about medications Continue amlodipine and losartan F/u in 3 months

## 2016-10-18 ENCOUNTER — Emergency Department (HOSPITAL_COMMUNITY)
Admission: EM | Admit: 2016-10-18 | Discharge: 2016-10-18 | Disposition: A | Discharge status: Home / Self Care | Payer: Medicare Other | Attending: Emergency Medicine | Admitting: Emergency Medicine

## 2016-10-18 ENCOUNTER — Emergency Department (HOSPITAL_COMMUNITY): Payer: Medicare Other

## 2016-10-18 ENCOUNTER — Encounter (HOSPITAL_COMMUNITY): Payer: Self-pay | Admitting: Emergency Medicine

## 2016-10-18 DIAGNOSIS — I110 Hypertensive heart disease with heart failure: Secondary | ICD-10-CM | POA: Diagnosis not present

## 2016-10-18 DIAGNOSIS — F17210 Nicotine dependence, cigarettes, uncomplicated: Secondary | ICD-10-CM | POA: Diagnosis not present

## 2016-10-18 DIAGNOSIS — Z7982 Long term (current) use of aspirin: Secondary | ICD-10-CM | POA: Insufficient documentation

## 2016-10-18 DIAGNOSIS — I5030 Unspecified diastolic (congestive) heart failure: Secondary | ICD-10-CM | POA: Diagnosis not present

## 2016-10-18 DIAGNOSIS — M25562 Pain in left knee: Secondary | ICD-10-CM | POA: Diagnosis not present

## 2016-10-18 MED ORDER — TRAMADOL HCL 50 MG PO TABS: 50.0000 mg | ORAL_TABLET | Freq: Four times a day (QID) | ORAL | 0 refills | Status: DC | PRN

## 2016-10-18 NOTE — ED Notes (Signed)
Pt stable, understands discharge instructions, and reasons for return.   

## 2016-10-18 NOTE — ED Provider Notes (Signed)
[facility] DEPT Provider Note   CSN: [id number] Arrival date & time: 10/18/16  1436  By signing my name below, I, Hansel Feinstein, attest that this documentation has been prepared under the direction and in the presence of Malvin Johns, MD. Electronically Signed: Hansel Feinstein, ED Scribe. 10/18/16. 4:05 PM.     History   Chief Complaint Chief Complaint  Patient presents with  . Knee Pain    HPI IZELL KISLER is a 66 y.o. male who presents to the Emergency Department complaining of moderate left knee pain and swelling that began this morning. Pt reports frequent h/o similar symptoms. He states he did heavy lifting yesterday and suspects this exacerbated his symptoms. He denies fall, twisting, injury or trauma. He states he has been using a steroid cream that was prescribed by his doctor with inadequate relief. He notes mild relief of symptoms with ice application in the ED. Pt states his pain is worsened with movement and weight bearing. He denies hip pain, numbness, additional complaints.   The history is provided by the patient. No language interpreter was used.    Past Medical History:  Diagnosis Date  . BPH (benign prostatic hyperplasia)   . Colon polyp 2010  . Depression   . GERD (gastroesophageal reflux disease)   . Hepatitis C    "caught it when I had a blood transfusion"  . High cholesterol   . History of blood transfusion    "when I was young"  . Hypertension   . Paranoid schizophrenia ([place])   . Prostate atrophy   . Retinal vein occlusion     Patient Active Problem List   Diagnosis Date Noted  . Back pain 09/14/2016  . Tobacco abuse   . Chest pain 08/20/2016  . Onychomycosis 01/17/2016  . Diastolic heart failure ([place]) 02/05/2015  . Hyperlipidemia 03/10/2014  . Patellar tendonitis 11/16/2012  . [place] OCCLUSION 06/03/2010  . PARANOID SCHIZOPHRENIA, CHRONIC 01/17/2009  . HYPERTENSION, BENIGN ESSENTIAL 01/17/2009  . BPH (benign prostatic  hyperplasia) 01/17/2009  . HEPATITIS C 12/14/2008  . DEPRESSION 12/14/2008    Past Surgical History:  Procedure Laterality Date  . CIRCUMCISION  1959  . TONSILLECTOMY         Home Medications    Prior to Admission medications   Medication Sig Start Date End Date Taking? Authorizing Provider  amLODipine (NORVASC) 10 MG tablet Take 1 tablet (10 mg total) by mouth daily. 08/23/16   Mercy Riding, MD  aspirin 81 MG tablet Take 1 tablet (81 mg total) by mouth daily. 09/24/14   [name], MD  atorvastatin (LIPITOR) 40 MG tablet Take 1 tablet (40 mg total) by mouth daily. 11/13/15   [name], MD  benztropine (COGENTIN) 1 MG tablet Take 1 tablet (1 mg total) by mouth 2 (two) times daily. 10/07/16   [name], MD  cetirizine (ZYRTEC) 10 MG tablet take 1 tablet by mouth once daily 02/20/15   [name], MD  diclofenac sodium (VOLTAREN) 1 % GEL Apply to L knee QID prn pain Patient taking differently: Apply 2 g topically 4 (four) times daily as needed (pain). Apply to L knee QID prn pain 05/06/16   [name], MD  fluPHENAZine decanoate (PROLIXIN) 25 MG/ML injection Inject 25 mg into the muscle every 14 (fourteen) days. Last dose 09/07/12    Historical Provider, MD  guaiFENesin (ROBITUSSIN) 100 MG/5ML liquid Take 5-10 mLs (100-200 mg total) by mouth every 4 (four) hours as  needed for cough. 08/05/16   Charlann Lange, PA-C  losartan (COZAAR) 100 MG tablet Take 1 tablet (100 mg total) by mouth daily. 10/07/16   [name], MD  Multiple Vitamin (MULTIVITAMIN WITH MINERALS) TABS tablet Take 1 tablet by mouth daily. 06/28/14   [name], MD  tamsulosin (FLOMAX) 0.4 MG CAPS capsule take 2 capsules by mouth once daily 09/28/16   [name], MD  traMADol (ULTRAM) 50 MG tablet Take 1 tablet (50 mg total) by mouth every 6 (six) hours as needed. 10/18/16   Malvin Johns, MD  triamcinolone cream (KENALOG) 0.1 % Apply 1 application topically 2 (two)  times daily. For 5 days, then twice daily as needed for rash and itching. 01/03/15   [name], MD  vitamin C (ASCORBIC ACID) 500 MG tablet Take 500 mg by mouth daily.    Historical Provider, MD    Family History Family History  Problem Relation Age of Onset  . Hypertension Mother   . Diabetes Mother   . Stroke Mother     Social History Social History  Substance Use Topics  . Smoking status: Current Every Day Smoker    Packs/day: 0.50    Years: 48.00    Types: Cigarettes  . Smokeless tobacco: Never Used  . Alcohol use No     Allergies   Lisinopril and Penicillins   Review of Systems Review of Systems  Constitutional: Negative for fever.  Gastrointestinal: Negative for nausea and vomiting.  Musculoskeletal: Positive for arthralgias and joint swelling. Negative for back pain and neck pain.  Skin: Negative for wound.  Neurological: Negative for weakness, numbness and headaches.     Physical Exam Updated Vital Signs BP 175/93 (BP Location: Right Arm)   Pulse 71   Temp 98.6 F (37 C) (Oral)   Resp 18   Ht 5\' 9"  (1.753 m)   Wt 186 lb (84.4 kg)   SpO2 98%   BMI 27.47 kg/m   Physical Exam  Constitutional: He is oriented to person, place, and time. He appears well-developed and well-nourished.  HENT:  Head: Normocephalic and atraumatic.  Neck: Normal range of motion. Neck supple.  Cardiovascular: Normal rate.   Pulmonary/Chest: Effort normal.  Musculoskeletal: He exhibits tenderness.  TTP of the left knee. There is no swelling or effusion. No warmth or erythema Some laxity on valgus stress, but other ligaments intact. No pain to the ankle or hip. NVI distally.   Neurological: He is alert and oriented to person, place, and time.  Skin: Skin is warm and dry.  Psychiatric: He has a normal mood and affect.  Nursing note and vitals reviewed.    ED Treatments / Results   DIAGNOSTIC STUDIES: Oxygen Saturation is 98% on RA, normal by my interpretation.     COORDINATION OF CARE: 3:50 PM Discussed treatment plan with pt at bedside which includes XR and pt agreed to plan.    Labs (all labs ordered are listed, but only abnormal results are displayed) Labs Reviewed - No data to display  EKG  EKG Interpretation None       Radiology Dg Knee Complete 4 Views Left  Result Date: 10/18/2016 CLINICAL DATA:  Acute left knee pain without recent injury. EXAM: LEFT KNEE - COMPLETE 4+ VIEW COMPARISON:  Radiographs of July 15, 2016. FINDINGS: No evidence of fracture, dislocation, or joint effusion. No evidence of arthropathy or other focal bone abnormality. Soft tissues are unremarkable. IMPRESSION: Normal left knee. Electronically Signed   By:  Marijo Conception, M.D.   On: 10/18/2016 15:42    Procedures Procedures (including critical care time)  Medications Ordered in ED Medications - No data to display   Initial Impression / Assessment and Plan / ED Course  I have reviewed the triage vital signs and the nursing notes.  Pertinent labs & imaging results that were available during my care of the patient were reviewed by me and considered in my medical decision making (see chart for details).     Patient X-Ray negative for obvious fracture or dislocation.  Pt advised to follow up with orthopedics. Conservative therapy recommended and discussed which includes ice and antiinflammatories/tramadol. Patient will be discharged home & is agreeable with above plan. Returns precautions discussed. Pt appears safe for discharge.   Final Clinical Impressions(s) / ED Diagnoses   Final diagnoses:  Acute pain of left knee    New Prescriptions Current Discharge Medication List      I personally performed the services described in this documentation, which was scribed in my presence.  The recorded information has been reviewed and considered.    Malvin Johns, MD 10/18/16 [phone]

## 2016-10-18 NOTE — ED Triage Notes (Signed)
C/o left knee pain and swelling-- hx of same-- states last time that it was hurting was 1.5 yrs ago--- pt limping on leg-- has a knee sleeve on.

## 2016-10-21 DIAGNOSIS — F209 Schizophrenia, unspecified: Secondary | ICD-10-CM | POA: Diagnosis not present

## 2016-11-03 DIAGNOSIS — F209 Schizophrenia, unspecified: Secondary | ICD-10-CM | POA: Diagnosis not present

## 2016-11-04 ENCOUNTER — Encounter (HOSPITAL_COMMUNITY): Payer: Self-pay | Admitting: Emergency Medicine

## 2016-11-04 DIAGNOSIS — I5030 Unspecified diastolic (congestive) heart failure: Secondary | ICD-10-CM | POA: Diagnosis not present

## 2016-11-04 DIAGNOSIS — M25562 Pain in left knee: Secondary | ICD-10-CM | POA: Diagnosis not present

## 2016-11-04 DIAGNOSIS — Z7982 Long term (current) use of aspirin: Secondary | ICD-10-CM | POA: Insufficient documentation

## 2016-11-04 DIAGNOSIS — F17210 Nicotine dependence, cigarettes, uncomplicated: Secondary | ICD-10-CM | POA: Insufficient documentation

## 2016-11-04 DIAGNOSIS — F209 Schizophrenia, unspecified: Secondary | ICD-10-CM | POA: Diagnosis not present

## 2016-11-04 DIAGNOSIS — I110 Hypertensive heart disease with heart failure: Secondary | ICD-10-CM | POA: Insufficient documentation

## 2016-11-04 DIAGNOSIS — Z79899 Other long term (current) drug therapy: Secondary | ICD-10-CM | POA: Diagnosis not present

## 2016-11-04 DIAGNOSIS — G8929 Other chronic pain: Secondary | ICD-10-CM | POA: Diagnosis not present

## 2016-11-04 NOTE — ED Triage Notes (Signed)
Pt. reports left knee pain with mild swelling onset last night , denies injury/ambulatory .

## 2016-11-05 ENCOUNTER — Emergency Department (HOSPITAL_COMMUNITY)
Admission: EM | Admit: 2016-11-05 | Discharge: 2016-11-05 | Disposition: A | Discharge status: Home / Self Care | Payer: Medicare Other | Attending: Emergency Medicine | Admitting: Emergency Medicine

## 2016-11-05 DIAGNOSIS — G8929 Other chronic pain: Secondary | ICD-10-CM

## 2016-11-05 DIAGNOSIS — M25562 Pain in left knee: Secondary | ICD-10-CM

## 2016-11-05 MED ORDER — BENZTROPINE MESYLATE 1 MG PO TABS: 1.0000 mg | ORAL_TABLET | Freq: Two times a day (BID) | ORAL | 0 refills | Status: DC

## 2016-11-05 NOTE — ED Provider Notes (Signed)
[facility] [facility] DEPT Provider Note  CSN: [id number] Arrival date & time: 11/04/16  2346  By signing my name below, I, Arianna Nassar, attest that this documentation has been prepared under the direction and in the presence of [name], MD.  Electronically Signed: Julien Nordmann, ED Scribe. 11/05/16. 2:58 AM.    History   Chief Complaint Chief Complaint  Patient presents with  . Knee Pain    The history is provided by the patient. No language interpreter was used.   HPI Comments: [name] is a 66 y.o. male who presents to the Emergency Department complaining of acute on-chronic left knee pain x yesterday. He reports associated mild swelling. Pt expresses that he injured his left knee in the [place] many years ago and it has been giving him pain intermittently ever since. He was seen on 10/18/16 for the similar knee pain and was told to follow up with ortho and take antiinflammatories. Pt has been taking aspirin to alleviate his pain without relief. Pt denies any new injuries. He has no other complaints.  Past Medical History:  Diagnosis Date  . BPH (benign prostatic hyperplasia)   . Colon polyp 2010  . Depression   . GERD (gastroesophageal reflux disease)   . Hepatitis C    "caught it when I had a blood transfusion"  . High cholesterol   . History of blood transfusion    "when I was young"  . Hypertension   . Paranoid schizophrenia ([place])   . Prostate atrophy   . Retinal vein occlusion     Patient Active Problem List   Diagnosis Date Noted  . Back pain 09/14/2016  . Tobacco abuse   . Chest pain 08/20/2016  . Onychomycosis 01/17/2016  . Diastolic heart failure ([place]) 02/05/2015  . Hyperlipidemia 03/10/2014  . Patellar tendonitis 11/16/2012  . [place] OCCLUSION 06/03/2010  . PARANOID SCHIZOPHRENIA, CHRONIC 01/17/2009  . HYPERTENSION, BENIGN ESSENTIAL 01/17/2009  . BPH (benign prostatic hyperplasia) 01/17/2009  . HEPATITIS C 12/14/2008  . DEPRESSION  12/14/2008    Past Surgical History:  Procedure Laterality Date  . CIRCUMCISION  1959  . TONSILLECTOMY         Home Medications    Prior to Admission medications   Medication Sig Start Date End Date Taking? Authorizing Provider  amLODipine (NORVASC) 10 MG tablet Take 1 tablet (10 mg total) by mouth daily. 08/23/16   Mercy Riding, MD  aspirin 81 MG tablet Take 1 tablet (81 mg total) by mouth daily. 09/24/14   [name], MD  atorvastatin (LIPITOR) 40 MG tablet Take 1 tablet (40 mg total) by mouth daily. 11/13/15   [name], MD  benztropine (COGENTIN) 1 MG tablet Take 1 tablet (1 mg total) by mouth 2 (two) times daily. 10/07/16   [name], MD  cetirizine (ZYRTEC) 10 MG tablet take 1 tablet by mouth once daily 02/20/15   [name], MD  diclofenac sodium (VOLTAREN) 1 % GEL Apply to L knee QID prn pain Patient taking differently: Apply 2 g topically 4 (four) times daily as needed (pain). Apply to L knee QID prn pain 05/06/16   [name], MD  fluPHENAZine decanoate (PROLIXIN) 25 MG/ML injection Inject 25 mg into the muscle every 14 (fourteen) days. Last dose 09/07/12    Historical Provider, MD  guaiFENesin (ROBITUSSIN) 100 MG/5ML liquid Take 5-10 mLs (100-200 mg total) by mouth every 4 (four) hours as needed for cough. 08/05/16   Charlann Lange,  PA-C  losartan (COZAAR) 100 MG tablet Take 1 tablet (100 mg total) by mouth daily. 10/07/16   [name], MD  Multiple Vitamin (MULTIVITAMIN WITH MINERALS) TABS tablet Take 1 tablet by mouth daily. 06/28/14   [name], MD  tamsulosin (FLOMAX) 0.4 MG CAPS capsule take 2 capsules by mouth once daily 09/28/16   [name], MD  traMADol (ULTRAM) 50 MG tablet Take 1 tablet (50 mg total) by mouth every 6 (six) hours as needed. 10/18/16   Malvin Johns, MD  triamcinolone cream (KENALOG) 0.1 % Apply 1 application topically 2 (two) times daily. For 5 days, then twice daily as needed for rash and  itching. 01/03/15   [name], MD  vitamin C (ASCORBIC ACID) 500 MG tablet Take 500 mg by mouth daily.    Historical Provider, MD    Family History Family History  Problem Relation Age of Onset  . Hypertension Mother   . Diabetes Mother   . Stroke Mother     Social History Social History  Substance Use Topics  . Smoking status: Current Every Day Smoker    Packs/day: 0.50    Years: 48.00    Types: Cigarettes  . Smokeless tobacco: Never Used  . Alcohol use No     Allergies   Lisinopril and Penicillins   Review of Systems Review of Systems  A complete 10 system review of systems was obtained and all systems are negative except as noted in the HPI and PMH.   Physical Exam Updated Vital Signs BP 138/74 (BP Location: Left Arm)   Pulse 73   Temp 98.7 F (37.1 C) (Oral)   Resp 16   Ht 5\' 9"  (1.753 m)   Wt 186 lb (84.4 kg)   SpO2 100%   BMI 27.47 kg/m   Physical Exam  Constitutional: He is oriented to person, place, and time. He appears well-developed and well-nourished.  HENT:  Head: Normocephalic.  Eyes: EOM are normal.  Neck: Normal range of motion.  Pulmonary/Chest: Effort normal.  Abdominal: He exhibits no distension.  Musculoskeletal: Normal range of motion.  Full ROM of left knee, normal pulses, no ecchymosis  Neurological: He is alert and oriented to person, place, and time.  Psychiatric: He has a normal mood and affect.  Nursing note and vitals reviewed.    ED Treatments / Results  DIAGNOSTIC STUDIES: Oxygen Saturation is 100% on RA, normal by my interpretation.  COORDINATION OF CARE:  2:57 AM Discussed treatment plan with pt at bedside and pt agreed to plan.  Labs (all labs ordered are listed, but only abnormal results are displayed) Labs Reviewed - No data to display  EKG  EKG Interpretation None       Radiology No results found.  Procedures Procedures (including critical care time)  Medications Ordered in ED Medications  - No data to display   Initial Impression / Assessment and Plan / ED Course  I have reviewed the triage vital signs and the nursing notes.  Pertinent labs & imaging results that were available during my care of the patient were reviewed by me and considered in my medical decision making (see chart for details).     Chronic left knee pain. Ambulatory in the ER. Dc home in good condition. Requests refill of his cogentin  Final Clinical Impressions(s) / ED Diagnoses   Final diagnoses:  Chronic pain of left knee   I personally performed the services described in this documentation, which was scribed in  my presence. The recorded information has been reviewed and is accurate.     New Prescriptions Discharge Medication List as of 11/05/2016  4:03 AM       [name], MD 11/05/16 [phone]

## 2016-11-07 ENCOUNTER — Emergency Department (HOSPITAL_COMMUNITY)
Admission: EM | Admit: 2016-11-07 | Discharge: 2016-11-07 | Disposition: A | Discharge status: Home / Self Care | Payer: Medicare Other | Attending: Emergency Medicine | Admitting: Emergency Medicine

## 2016-11-07 ENCOUNTER — Encounter (HOSPITAL_COMMUNITY): Payer: Self-pay

## 2016-11-07 DIAGNOSIS — R05 Cough: Secondary | ICD-10-CM | POA: Diagnosis present

## 2016-11-07 DIAGNOSIS — F17210 Nicotine dependence, cigarettes, uncomplicated: Secondary | ICD-10-CM | POA: Insufficient documentation

## 2016-11-07 DIAGNOSIS — M25562 Pain in left knee: Secondary | ICD-10-CM | POA: Diagnosis not present

## 2016-11-07 DIAGNOSIS — G8929 Other chronic pain: Secondary | ICD-10-CM | POA: Diagnosis not present

## 2016-11-07 DIAGNOSIS — I509 Heart failure, unspecified: Secondary | ICD-10-CM | POA: Insufficient documentation

## 2016-11-07 DIAGNOSIS — J069 Acute upper respiratory infection, unspecified: Secondary | ICD-10-CM | POA: Insufficient documentation

## 2016-11-07 DIAGNOSIS — Z79899 Other long term (current) drug therapy: Secondary | ICD-10-CM | POA: Diagnosis not present

## 2016-11-07 DIAGNOSIS — I110 Hypertensive heart disease with heart failure: Secondary | ICD-10-CM | POA: Insufficient documentation

## 2016-11-07 DIAGNOSIS — Z7982 Long term (current) use of aspirin: Secondary | ICD-10-CM | POA: Insufficient documentation

## 2016-11-07 MED ORDER — OXYCODONE-ACETAMINOPHEN 5-325 MG PO TABS
1.0000 | ORAL_TABLET | Freq: Once | ORAL | Status: AC
  Administered 2016-11-07: 1 via ORAL
  Filled 2016-11-07: qty 1

## 2016-11-07 MED ORDER — GUAIFENESIN ER 600 MG PO TB12: 600.0000 mg | ORAL_TABLET | Freq: Two times a day (BID) | ORAL | 0 refills | Status: DC

## 2016-11-07 MED ORDER — FLUTICASONE PROPIONATE 50 MCG/ACT NA SUSP: 1.0000 | Freq: Every day | NASAL | 0 refills | Status: DC

## 2016-11-07 NOTE — ED Triage Notes (Signed)
Pt complaining of "headcold." Pt states sneezing and fevers. Pt complaining of dry cough.  Pt also complaining of L knee pain. Pt states old injury, recently began swelling again, no new acute injury/trauma.

## 2016-11-07 NOTE — ED Provider Notes (Signed)
[facility] DEPT Provider Note   CSN: [id number] Arrival date & time: 11/07/16  2215   By signing my name below, I, Eunice Blase, attest that this documentation has been prepared under the direction and in the presence of Shary Decamp, PA-C. Electronically Signed: Eunice Blase, Scribe. 11/07/16. 11:26 PM.   History   Chief Complaint Chief Complaint  Patient presents with  . Cough  . Knee Pain   The history is provided by the patient and medical records. No language interpreter was used.    HPI Comments: [name] is a 66 y.o. male who presents to the Emergency Department complaining of persistent productive cough today. He notes associated sneezing, rhinorrhea, ear pain and congestion. Pt reports he has taken ibuprofen without relief. Pt denies fever. No sick contacts.   He also notes worsened L knee pain today. Noted chronic knee pain. Worsens with cold weather. He notes associated gait change and decreased ROM d/t pain. He states he has applied rubbing alcohol and heat without relief to pain or swelling. No new trauma to area. No falls. No numbness/tingling.  Past Medical History:  Diagnosis Date  . BPH (benign prostatic hyperplasia)   . Colon polyp 2010  . Depression   . GERD (gastroesophageal reflux disease)   . Hepatitis C    "caught it when I had a blood transfusion"  . High cholesterol   . History of blood transfusion    "when I was young"  . Hypertension   . Paranoid schizophrenia ([place])   . Prostate atrophy   . Retinal vein occlusion     Patient Active Problem List   Diagnosis Date Noted  . Back pain 09/14/2016  . Tobacco abuse   . Chest pain 08/20/2016  . Onychomycosis 01/17/2016  . Diastolic heart failure ([place]) 02/05/2015  . Hyperlipidemia 03/10/2014  . Patellar tendonitis 11/16/2012  . [place] OCCLUSION 06/03/2010  . PARANOID SCHIZOPHRENIA, CHRONIC 01/17/2009  . HYPERTENSION, BENIGN ESSENTIAL 01/17/2009  . BPH (benign prostatic  hyperplasia) 01/17/2009  . HEPATITIS C 12/14/2008  . DEPRESSION 12/14/2008    Past Surgical History:  Procedure Laterality Date  . CIRCUMCISION  1959  . TONSILLECTOMY         Home Medications    Prior to Admission medications   Medication Sig Start Date End Date Taking? Authorizing Provider  amLODipine (NORVASC) 10 MG tablet Take 1 tablet (10 mg total) by mouth daily. 08/23/16   Mercy Riding, MD  aspirin 81 MG tablet Take 1 tablet (81 mg total) by mouth daily. 09/24/14   [name], MD  atorvastatin (LIPITOR) 40 MG tablet Take 1 tablet (40 mg total) by mouth daily. 11/13/15   [name], MD  benztropine (COGENTIN) 1 MG tablet Take 1 tablet (1 mg total) by mouth 2 (two) times daily. 11/05/16   [name], MD  cetirizine (ZYRTEC) 10 MG tablet take 1 tablet by mouth once daily 02/20/15   [name], MD  diclofenac sodium (VOLTAREN) 1 % GEL Apply to L knee QID prn pain Patient taking differently: Apply 2 g topically 4 (four) times daily as needed (pain). Apply to L knee QID prn pain 05/06/16   [name], MD  fluPHENAZine decanoate (PROLIXIN) 25 MG/ML injection Inject 25 mg into the muscle every 14 (fourteen) days. Last dose 09/07/12    Historical Provider, MD  guaiFENesin (ROBITUSSIN) 100 MG/5ML liquid Take 5-10 mLs (100-200 mg total) by mouth every 4 (four) hours as needed for cough.  08/05/16   Charlann Lange, PA-C  losartan (COZAAR) 100 MG tablet Take 1 tablet (100 mg total) by mouth daily. 10/07/16   [name], MD  Multiple Vitamin (MULTIVITAMIN WITH MINERALS) TABS tablet Take 1 tablet by mouth daily. 06/28/14   [name], MD  tamsulosin (FLOMAX) 0.4 MG CAPS capsule take 2 capsules by mouth once daily 09/28/16   [name], MD  traMADol (ULTRAM) 50 MG tablet Take 1 tablet (50 mg total) by mouth every 6 (six) hours as needed. 10/18/16   Malvin Johns, MD  triamcinolone cream (KENALOG) 0.1 % Apply 1 application topically 2 (two) times  daily. For 5 days, then twice daily as needed for rash and itching. 01/03/15   [name], MD  vitamin C (ASCORBIC ACID) 500 MG tablet Take 500 mg by mouth daily.    Historical Provider, MD    Family History Family History  Problem Relation Age of Onset  . Hypertension Mother   . Diabetes Mother   . Stroke Mother     Social History Social History  Substance Use Topics  . Smoking status: Current Every Day Smoker    Packs/day: 0.50    Years: 48.00    Types: Cigarettes  . Smokeless tobacco: Never Used  . Alcohol use No     Allergies   Lisinopril and Penicillins   Review of Systems Review of Systems  Constitutional: Negative for chills and fever.  HENT: Positive for congestion, ear pain, rhinorrhea and sneezing.   Respiratory: Positive for cough.   Musculoskeletal: Positive for arthralgias, gait problem and joint swelling.     Physical Exam Updated Vital Signs BP (!) 154/91 (BP Location: Left Arm)   Pulse 74   Temp 98.1 F (36.7 C) (Oral)   Resp 18   SpO2 100%   Physical Exam  Constitutional: He is oriented to person, place, and time. Vital signs are normal. He appears well-developed and well-nourished. No distress.  HENT:  Head: Normocephalic and atraumatic.  Right Ear: Hearing, tympanic membrane, external ear and ear canal normal.  Left Ear: Hearing, tympanic membrane, external ear and ear canal normal.  Nose: Nose normal.  Mouth/Throat: Uvula is midline, oropharynx is clear and moist and mucous membranes are normal. No trismus in the jaw. No oropharyngeal exudate, posterior oropharyngeal erythema or tonsillar abscesses.  Eyes: Conjunctivae and EOM are normal. Pupils are equal, round, and reactive to light. Right eye exhibits no discharge. Left eye exhibits no discharge. No scleral icterus.  Neck: Normal range of motion. Neck supple. No JVD present. No tracheal deviation present.  Cardiovascular: Normal rate, regular rhythm, S1 normal, S2 normal, normal  heart sounds, intact distal pulses and normal pulses.   Pulmonary/Chest: Effort normal and breath sounds normal. No stridor. No respiratory distress. He has no decreased breath sounds. He has no wheezes. He has no rhonchi. He has no rales.  Abdominal: Normal appearance and bowel sounds are normal. There is no tenderness.  Musculoskeletal: Normal range of motion.  Left Knee: Negative anterior/poster drawer bilaterally. Negative ballottement test. No varus or valgus laxity. No crepitus. No pain with flexion or extension. No TTP of knees or ankles.   Neurological: He is alert and oriented to person, place, and time. Coordination normal.  Skin: Skin is warm and dry.  Psychiatric: He has a normal mood and affect. His speech is normal and behavior is normal. Judgment and thought content normal.  Nursing note and vitals reviewed.    ED Treatments / Results  DIAGNOSTIC STUDIES: Oxygen Saturation is 100% on RA, normal by my interpretation.    COORDINATION OF CARE: 11:25 PM Discussed treatment plan with pt at bedside and pt agreed to plan. Will order medication. Pt advised of symptomatic care and prepared for discharge. Labs (all labs ordered are listed, but only abnormal results are displayed) Labs Reviewed - No data to display  EKG  EKG Interpretation None       Radiology No results found.  Procedures Procedures (including critical care time)  Medications Ordered in ED Medications - No data to display   Initial Impression / Assessment and Plan / ED Course  I have reviewed the triage vital signs and the nursing notes.  Pertinent labs & imaging results that were available during my care of the patient were reviewed by me and considered in my medical decision making (see chart for details).  Final Clinical Impressions(s) / ED Diagnoses     {I have reviewed the relevant previous healthcare records.  {I obtained HPI from historian.   ED Course:  Assessment: Pt is a 66 y.o. male  presents with URI today. No fevers . On exam, pt in NAD. VSS. Afebrile. Lungs CTA, Heart RRR. Abdomen nontender/soft. Patients symptoms are consistent with URI, likely viral etiology. Discussed that antibiotics are not indicated for viral infections. Pt will be discharged with symptomatic treatment.  Verbalizes understanding and is agreeable with plan. Pt also with chronic knee pain that he states worsens with cold weather. NO new trauma or falls. Exam unremarkable. Pt is hemodynamically stable & in NAD prior to dc.  Disposition/Plan:  DC Home Additional Verbal discharge instructions given and discussed with patient.  Pt Instructed to f/u with PCP in the next week for evaluation and treatment of symptoms. Return precautions given Pt acknowledges and agrees with plan  Supervising Physician Nat Christen, MD  Final diagnoses:  Chronic pain of left knee  Upper respiratory tract infection, unspecified type    New Prescriptions New Prescriptions   No medications on file   I personally performed the services described in this documentation, which was scribed in my presence. The recorded information has been reviewed and is accurate.    Shary Decamp, PA-C 11/07/16 [place], MD 11/08/16 [phone]

## 2016-11-07 NOTE — ED Notes (Signed)
Pt called back to room x2 with no response

## 2016-11-07 NOTE — Discharge Instructions (Signed)
Please read and follow all provided instructions.  Your diagnoses today include:  1. Chronic pain of left knee   2. Upper respiratory tract infection, unspecified type     You appear to have an upper respiratory infection (URI). An upper respiratory tract infection, or cold, is a viral infection of the air passages leading to the lungs. It should improve gradually after 5-7 days. You may have a lingering cough that lasts for 2- 4 weeks after the infection.  Tests performed today include: Vital signs. See below for your results today.   Medications prescribed:   Take any prescribed medications only as directed. Treatment for your infection is aimed at treating the symptoms. There are no medications, such as antibiotics, that will cure your infection.   Home care instructions:  Follow any educational materials contained in this packet.   Your illness is contagious and can be spread to others, especially during the first 3 or 4 days. It cannot be cured by antibiotics or other medicines. Take basic precautions such as washing your hands often, covering your mouth when you cough or sneeze, and avoiding public places where you could spread your illness to others.   Please continue drinking plenty of fluids.  Use over-the-counter medicines as needed as directed on packaging for symptom relief.  You may also use ibuprofen or tylenol as directed on packaging for pain or fever.  Do not take multiple medicines containing Tylenol or acetaminophen to avoid taking too much of this medication.  Follow-up instructions: Please follow-up with your primary care provider in the next 3 days for further evaluation of your symptoms if you are not feeling better.   Return instructions:  Please return to the Emergency Department if you experience worsening symptoms.  RETURN IMMEDIATELY IF you develop shortness of breath, confusion or altered mental status, a new rash, become dizzy, faint, or poorly responsive, or  are unable to be cared for at home. Please return if you have persistent vomiting and cannot keep down fluids or develop a fever that is not controlled by tylenol or motrin.   Please return if you have any other emergent concerns.  Additional Information:  Your vital signs today were: BP (!) 154/91 (BP Location: Left Arm)    Pulse 74    Temp 98.1 F (36.7 C) (Oral)    Resp 18    SpO2 100%  If your blood pressure (BP) was elevated above 135/85 this visit, please have this repeated by your doctor within one month. --------------

## 2016-11-07 NOTE — ED Notes (Signed)
Pt returned to lobby after going to [place] to get coffee

## 2016-11-07 NOTE — ED Notes (Signed)
Pt stable, ambulatory, states understanding of discharge instructions 

## 2016-11-09 ENCOUNTER — Emergency Department (HOSPITAL_COMMUNITY)
Admission: EM | Admit: 2016-11-09 | Discharge: 2016-11-10 | Disposition: A | Discharge status: Home / Self Care | Payer: Medicare Other | Attending: Emergency Medicine | Admitting: Emergency Medicine

## 2016-11-09 ENCOUNTER — Encounter (HOSPITAL_COMMUNITY): Payer: Self-pay | Admitting: Emergency Medicine

## 2016-11-09 DIAGNOSIS — I110 Hypertensive heart disease with heart failure: Secondary | ICD-10-CM | POA: Diagnosis not present

## 2016-11-09 DIAGNOSIS — Z79899 Other long term (current) drug therapy: Secondary | ICD-10-CM | POA: Insufficient documentation

## 2016-11-09 DIAGNOSIS — F17210 Nicotine dependence, cigarettes, uncomplicated: Secondary | ICD-10-CM | POA: Insufficient documentation

## 2016-11-09 DIAGNOSIS — J029 Acute pharyngitis, unspecified: Secondary | ICD-10-CM

## 2016-11-09 DIAGNOSIS — Z7982 Long term (current) use of aspirin: Secondary | ICD-10-CM | POA: Diagnosis not present

## 2016-11-09 DIAGNOSIS — M25562 Pain in left knee: Secondary | ICD-10-CM | POA: Insufficient documentation

## 2016-11-09 DIAGNOSIS — G8929 Other chronic pain: Secondary | ICD-10-CM | POA: Insufficient documentation

## 2016-11-09 DIAGNOSIS — I5030 Unspecified diastolic (congestive) heart failure: Secondary | ICD-10-CM | POA: Diagnosis not present

## 2016-11-09 LAB — RAPID STREP SCREEN (MED CTR MEBANE ONLY): Streptococcus, Group A Screen (Direct): NEGATIVE

## 2016-11-09 NOTE — ED Triage Notes (Signed)
Pt. reports sore throat with occasional productive cough onset today , denies fever , respirations unlabored.

## 2016-11-10 DIAGNOSIS — M25562 Pain in left knee: Secondary | ICD-10-CM | POA: Diagnosis not present

## 2016-11-10 MED ORDER — ACETAMINOPHEN 325 MG PO TABS
650.0000 mg | ORAL_TABLET | Freq: Once | ORAL | Status: AC
  Administered 2016-11-10: 650 mg via ORAL
  Filled 2016-11-10 (×2): qty 2

## 2016-11-10 MED ORDER — DICLOFENAC SODIUM 1 % TD GEL: 2.0000 g | Freq: Four times a day (QID) | TRANSDERMAL | 0 refills | Status: AC | PRN

## 2016-11-10 NOTE — ED Notes (Addendum)
Patient angry. Stated he wanted "real pain medicine" - referring to narcotic medication. Patient had refused Tylenol earlier, but stated that he wanted it if it would make his throat feel better. Patient continued to talk about chronic knee injury from [employer] service "in 1972". Then patient began pacing and raising his voice stating that he already has big tubes of Voltaren at home for his knee. 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 Security also called and present.

## 2016-11-10 NOTE — ED Notes (Signed)
Ortho at bedside at this time.

## 2016-11-10 NOTE — Discharge Instructions (Signed)
Your strept test is negative You have been place in a knee support a dn given a refill for your pain crream Follow up with your PCP as needed

## 2016-11-10 NOTE — ED Notes (Signed)
Patient refused vital signs 

## 2016-11-10 NOTE — ED Provider Notes (Signed)
[facility] DEPT Provider Note   CSN: [id number] Arrival date & time: 11/09/16  2245     History   Chief Complaint Chief Complaint  Patient presents with  . Sore Throat    HPI [name] is a 66 y.o. male.  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  He's not taking any medication for his symptoms.      Past Medical History:  Diagnosis Date  . BPH (benign prostatic hyperplasia)   . Colon polyp 2010  . Depression   . GERD (gastroesophageal reflux disease)   . Hepatitis C    "caught it when I had a blood transfusion"  . High cholesterol   . History of blood transfusion    "when I was young"  . Hypertension   . Paranoid schizophrenia ([place])   . Prostate atrophy   . Retinal vein occlusion     Patient Active Problem List   Diagnosis Date Noted  . Back pain 09/14/2016  . Tobacco abuse   . Chest pain 08/20/2016  . Onychomycosis 01/17/2016  . Diastolic heart failure ([place]) 02/05/2015  . Hyperlipidemia 03/10/2014  . Patellar tendonitis 11/16/2012  . [place] OCCLUSION 06/03/2010  . PARANOID SCHIZOPHRENIA, CHRONIC 01/17/2009  . HYPERTENSION, BENIGN ESSENTIAL 01/17/2009  . BPH (benign prostatic hyperplasia) 01/17/2009  . HEPATITIS C 12/14/2008  . DEPRESSION 12/14/2008    Past Surgical History:  Procedure Laterality Date  . CIRCUMCISION  1959  . TONSILLECTOMY         Home Medications    Prior to Admission medications   Medication Sig Start Date End Date Taking? Authorizing Provider  amLODipine (NORVASC) 10 MG tablet Take 1 tablet (10 mg total) by mouth daily. 08/23/16   Mercy Riding, MD  aspirin 81 MG tablet Take 1 tablet (81 mg total) by mouth daily. 09/24/14   [name], MD  atorvastatin (LIPITOR) 40 MG tablet Take 1 tablet (40 mg total) by mouth daily. 11/13/15   [name], MD  benztropine (COGENTIN) 1 MG  tablet Take 1 tablet (1 mg total) by mouth 2 (two) times daily. 11/05/16   [name], MD  cetirizine (ZYRTEC) 10 MG tablet take 1 tablet by mouth once daily 02/20/15   [name], MD  diclofenac sodium (VOLTAREN) 1 % GEL Apply 2 g topically 4 (four) times daily as needed (pain). Apply to L knee QID prn pain 11/10/16 11/20/16  Junius Creamer, NP  fluPHENAZine decanoate (PROLIXIN) 25 MG/ML injection Inject 25 mg into the muscle every 14 (fourteen) days. Last dose 09/07/12    Historical Provider, MD  fluticasone (FLONASE) 50 MCG/ACT nasal spray Place 1 spray into both nostrils daily. 11/07/16   Shary Decamp, PA-C  guaiFENesin (MUCINEX) 600 MG 12 hr tablet Take 1 tablet (600 mg total) by mouth 2 (two) times daily. 11/07/16   Shary Decamp, PA-C  losartan (COZAAR) 100 MG tablet Take 1 tablet (100 mg total) by mouth daily. 10/07/16   [name], MD  Multiple Vitamin (MULTIVITAMIN WITH MINERALS) TABS tablet Take 1 tablet by mouth daily. 06/28/14   [name], MD  tamsulosin (FLOMAX) 0.4 MG CAPS capsule take 2 capsules by mouth once daily 09/28/16   [name], MD  traMADol (ULTRAM) 50 MG tablet Take 1 tablet (50 mg total) by mouth every 6 (six) hours as needed. 10/18/16   Melanie  Belfi, MD  triamcinolone cream (KENALOG) 0.1 % Apply 1 application topically 2 (two) times daily. For 5 days, then twice daily as needed for rash and itching. 01/03/15   [name], MD  vitamin C (ASCORBIC ACID) 500 MG tablet Take 500 mg by mouth daily.    Historical Provider, MD    Family History Family History  Problem Relation Age of Onset  . Hypertension Mother   . Diabetes Mother   . Stroke Mother     Social History Social History  Substance Use Topics  . Smoking status: Current Every Day Smoker    Packs/day: 0.50    Years: 48.00    Types: Cigarettes  . Smokeless tobacco: Never Used  . Alcohol use No     Allergies   Lisinopril and Penicillins   Review of Systems Review of Systems    Constitutional: Negative for fever.  HENT: Positive for sore throat. Negative for drooling and rhinorrhea.   Respiratory: Negative for shortness of breath.   Cardiovascular: Negative for chest pain.  Musculoskeletal: Positive for arthralgias. Negative for joint swelling.  Neurological: Negative for headaches.  All other systems reviewed and are negative.    Physical Exam Updated Vital Signs BP (!) 157/110 (BP Location: Left Arm)   Pulse 80   Temp 98.2 F (36.8 C) (Oral)   Resp 18   Wt 84.4 kg   SpO2 100%   BMI 27.47 kg/m   Physical Exam  Constitutional: He is oriented to person, place, and time. He appears well-developed and well-nourished. No distress.  HENT:  Right Ear: External ear normal.  Left Ear: External ear normal.  Mouth/Throat: No uvula swelling. Posterior oropharyngeal erythema present.  Eyes: Pupils are equal, round, and reactive to light.  Neck: Normal range of motion.  Cardiovascular: Normal rate and regular rhythm.   Pulmonary/Chest: Effort normal and breath sounds normal.  Musculoskeletal:       Left knee: He exhibits normal range of motion, no swelling, no effusion, no ecchymosis, no deformity, no laceration, no erythema, normal alignment, no LCL laxity and no bony tenderness. Tenderness found. No patellar tendon tenderness noted.       Legs: Lymphadenopathy:    He has no cervical adenopathy.  Neurological: He is alert and oriented to person, place, and time.  Nursing note and vitals reviewed.    ED Treatments / Results  Labs (all labs ordered are listed, but only abnormal results are displayed) Labs Reviewed  RAPID STREP SCREEN (NOT AT [facility])  CULTURE, GROUP A STREP [facility])    EKG  EKG Interpretation None       Radiology No results found.  Procedures Procedures (including critical care time)  Medications Ordered in ED Medications  acetaminophen (TYLENOL) tablet 650 mg (650 mg Oral Refused 11/10/16 0017)     Initial Impression /  Assessment and Plan / ED Course  I have reviewed the triage vital signs and the nursing notes.  Pertinent labs & imaging results that were available during my care of the patient were reviewed by me and considered in my medical decision making (see chart for details).    Patient will be given Tylenol for his discomfort in knee sleeve for support and a rapid strep test  Rapid strep is negative.  Patient is given a refill on his pain rub.  Re[facility]  Final Clinical Impressions(s) / ED Diagnoses   Final diagnoses:  Pharyngitis, unspecified etiology  Chronic pain of left knee  New Prescriptions Current Discharge Medication List       Junius Creamer, NP 11/10/16 [id number]    Quintella Reichert, MD 11/10/16 [phone]

## 2016-11-12 LAB — CULTURE, GROUP A STREP (THRC)

## 2016-11-15 DIAGNOSIS — F17210 Nicotine dependence, cigarettes, uncomplicated: Secondary | ICD-10-CM | POA: Insufficient documentation

## 2016-11-15 DIAGNOSIS — Z79899 Other long term (current) drug therapy: Secondary | ICD-10-CM | POA: Insufficient documentation

## 2016-11-15 DIAGNOSIS — Z7982 Long term (current) use of aspirin: Secondary | ICD-10-CM | POA: Diagnosis not present

## 2016-11-15 DIAGNOSIS — G8929 Other chronic pain: Secondary | ICD-10-CM | POA: Insufficient documentation

## 2016-11-15 DIAGNOSIS — M25562 Pain in left knee: Secondary | ICD-10-CM | POA: Insufficient documentation

## 2016-11-15 DIAGNOSIS — I10 Essential (primary) hypertension: Secondary | ICD-10-CM | POA: Insufficient documentation

## 2016-11-16 ENCOUNTER — Encounter (HOSPITAL_COMMUNITY): Payer: Self-pay | Admitting: Emergency Medicine

## 2016-11-16 ENCOUNTER — Emergency Department (HOSPITAL_COMMUNITY)
Admission: EM | Admit: 2016-11-16 | Discharge: 2016-11-16 | Disposition: A | Discharge status: Home / Self Care | Payer: Medicare Other | Attending: Emergency Medicine | Admitting: Emergency Medicine

## 2016-11-16 DIAGNOSIS — G8929 Other chronic pain: Secondary | ICD-10-CM

## 2016-11-16 DIAGNOSIS — M25562 Pain in left knee: Secondary | ICD-10-CM

## 2016-11-16 MED ORDER — NAPROXEN 500 MG PO TABS: 500.0000 mg | ORAL_TABLET | Freq: Two times a day (BID) | ORAL | 0 refills | Status: DC

## 2016-11-16 NOTE — ED Notes (Signed)
Pt ambulated w/o complaint from triage to room

## 2016-11-16 NOTE — Discharge Instructions (Signed)
Take naproxen as prescribed. Ice your knee to relief inflammation.  You need to follow up with your orthpedist as soon as you are able to.  You have been given joint injections in the past which were successful, you will likely benefit from more. If you do not have an orthopedist, contact [facility] to establish care.  You will be allowed to wait in the waiting room until you are able to catch your bus.  Please be mindful of patient in the waiting room and staff.  If you become disruptive security may ask you to leave.

## 2016-11-16 NOTE — ED Provider Notes (Signed)
[facility] DEPT Provider Note   CSN: [id number] Arrival date & time: 11/15/16  2350     History   Chief Complaint Chief Complaint  Patient presents with  . Knee Pain    HPI [name] is a 66 y.o. male presents to ED with diffuse, anterior left knee pain.  Aggravating factors include palpation and bending of knee.  Patient tells me he first injured his left knee in 1972 while in [employer]  His knee has been intermittently hurting him. He wears a knee brace at all times.  Patient has been evaluated by orthopedist "years ago" (Dr. Theda Sers). Patient notes he received a joint injection once which helped his knee pain.  He reports he has not been able to go back to his orthopedist because he has "some debt" he can't afford to pay yet.  No IVDU. No h/o gout. No trauma, fall, recent injury. No associated warmth, redness, swelling, n/t/w of left lower extremity.   HPI  Past Medical History:  Diagnosis Date  . BPH (benign prostatic hyperplasia)   . Colon polyp 2010  . Depression   . GERD (gastroesophageal reflux disease)   . Hepatitis C    "caught it when I had a blood transfusion"  . High cholesterol   . History of blood transfusion    "when I was young"  . Hypertension   . Paranoid schizophrenia ([place])   . Prostate atrophy   . Retinal vein occlusion     Patient Active Problem List   Diagnosis Date Noted  . Back pain 09/14/2016  . Tobacco abuse   . Chest pain 08/20/2016  . Onychomycosis 01/17/2016  . Diastolic heart failure ([place]) 02/05/2015  . Hyperlipidemia 03/10/2014  . Patellar tendonitis 11/16/2012  . [place] OCCLUSION 06/03/2010  . PARANOID SCHIZOPHRENIA, CHRONIC 01/17/2009  . HYPERTENSION, BENIGN ESSENTIAL 01/17/2009  . BPH (benign prostatic hyperplasia) 01/17/2009  . HEPATITIS C 12/14/2008  . DEPRESSION 12/14/2008    Past Surgical History:  Procedure Laterality Date  . CIRCUMCISION  1959  . TONSILLECTOMY         Home Medications     Prior to Admission medications   Medication Sig Start Date End Date Taking? Authorizing Provider  amLODipine (NORVASC) 10 MG tablet Take 1 tablet (10 mg total) by mouth daily. 08/23/16   Mercy Riding, MD  aspirin 81 MG tablet Take 1 tablet (81 mg total) by mouth daily. 09/24/14   [name], MD  atorvastatin (LIPITOR) 40 MG tablet Take 1 tablet (40 mg total) by mouth daily. 11/13/15   [name], MD  benztropine (COGENTIN) 1 MG tablet Take 1 tablet (1 mg total) by mouth 2 (two) times daily. 11/05/16   [name], MD  cetirizine (ZYRTEC) 10 MG tablet take 1 tablet by mouth once daily 02/20/15   [name], MD  diclofenac sodium (VOLTAREN) 1 % GEL Apply 2 g topically 4 (four) times daily as needed (pain). Apply to L knee QID prn pain 11/10/16 11/20/16  Junius Creamer, NP  fluPHENAZine decanoate (PROLIXIN) 25 MG/ML injection Inject 25 mg into the muscle every 14 (fourteen) days. Last dose 09/07/12    Historical Provider, MD  fluticasone (FLONASE) 50 MCG/ACT nasal spray Place 1 spray into both nostrils daily. 11/07/16   Shary Decamp, PA-C  guaiFENesin (MUCINEX) 600 MG 12 hr tablet Take 1 tablet (600 mg total) by mouth 2 (two) times daily. 11/07/16   Shary Decamp, PA-C  losartan (COZAAR) 100 MG tablet Take  1 tablet (100 mg total) by mouth daily. 10/07/16   [name], MD  Multiple Vitamin (MULTIVITAMIN WITH MINERALS) TABS tablet Take 1 tablet by mouth daily. 06/28/14   [name], MD  naproxen (NAPROSYN) 500 MG tablet Take 1 tablet (500 mg total) by mouth 2 (two) times daily. 11/16/16   Kinnie Feil, PA-C  tamsulosin (FLOMAX) 0.4 MG CAPS capsule take 2 capsules by mouth once daily 09/28/16   [name], MD  traMADol (ULTRAM) 50 MG tablet Take 1 tablet (50 mg total) by mouth every 6 (six) hours as needed. 10/18/16   Malvin Johns, MD  triamcinolone cream (KENALOG) 0.1 % Apply 1 application topically 2 (two) times daily. For 5 days, then twice daily as needed for rash  and itching. 01/03/15   [name], MD  vitamin C (ASCORBIC ACID) 500 MG tablet Take 500 mg by mouth daily.    Historical Provider, MD    Family History Family History  Problem Relation Age of Onset  . Hypertension Mother   . Diabetes Mother   . Stroke Mother     Social History Social History  Substance Use Topics  . Smoking status: Current Every Day Smoker    Packs/day: 0.50    Years: 48.00    Types: Cigarettes  . Smokeless tobacco: Never Used  . Alcohol use No     Allergies   Lisinopril and Penicillins   Review of Systems Review of Systems  Constitutional: Negative for fever.  Musculoskeletal: Positive for arthralgias and gait problem. Negative for joint swelling.  Skin: Negative for color change and wound.  Neurological: Negative for weakness and numbness.     Physical Exam Updated Vital Signs BP 129/81 (BP Location: Right Arm)   Pulse 79   Temp 98.2 F (36.8 C) (Oral)   Resp 18   Ht 5\' 9"  (1.753 m)   Wt 84.4 kg   SpO2 98%   BMI 27.47 kg/m   Physical Exam  Constitutional: He is oriented to person, place, and time. He appears well-developed and well-nourished. No distress.  HENT:  Head: Normocephalic and atraumatic.  Eyes: Conjunctivae and EOM are normal. No scleral icterus.  Neck: Normal range of motion.  Cardiovascular: Normal rate, regular rhythm, normal heart sounds and intact distal pulses.   No murmur heard. No LE edema, lower extremities are symmetric  No calf tenderness No vericosities noted  Pulmonary/Chest: Effort normal and breath sounds normal. He has no wheezes.  Musculoskeletal: Normal range of motion. He exhibits tenderness. He exhibits no deformity.  +Medial and lateral joint line tenderness.   +Tenderness over MCL, LCL, patellar tendon and quad tendon Patient observed walking into room from waiting room without antalgic gait No obvious deformity of knees including edema, erythema or effusion.  Full passive ROM of knees  bilaterally with normal patellar J tracking bilaterally.  No bony tenderness over patella, fibular head or tibial tuberosity.   Negative Lachman's. Negative posterior drawer test.  Negative McMurray's. Negative ballottement test. No varus or valgus laxity.  No crepitus with knee ROM.  Patient able to bear weight in ED (4+ steps)  Lymphadenopathy:    He has no cervical adenopathy.  Neurological: He is alert and oriented to person, place, and time.  Skin: Skin is warm and dry. Capillary refill takes less than 2 seconds.  Psychiatric: He has a normal mood and affect. His behavior is normal. Judgment and thought content normal.  Nursing note and vitals reviewed.  ED Treatments / Results  Labs (all labs ordered are listed, but only abnormal results are displayed) Labs Reviewed - No data to display  EKG  EKG Interpretation None       Radiology No results found.  Procedures Procedures (including critical care time)  Medications Ordered in ED Medications - No data to display   Initial Impression / Assessment and Plan / ED Course  I have reviewed the triage vital signs and the nursing notes.  Pertinent labs & imaging results that were available during my care of the patient were reviewed by me and considered in my medical decision making (see chart for details).    66 yo male with h/o left knee pain presents with atraumatic left knee pain.  No signs of symptoms suggestive of septic arthritis, fracture, patellar dislocation or gout. No signs of DVT. Patient has been to ED for left knee pain four times last month, most recently on 11/09/16.  Last x-ray of left knee unremarkable (10/18/16).  No imaging or lab work indicated at this time.  Left knee pain is chronic. Patient has liver insufficiency, cannot take tylenol.  Elevated creatinine but acceptable GFR for naproxen, prescribed.  Patient has been prescribed tramadol, voltaren gel and advised to f/u with orthopedist.  He tells me he  has an orthopedist but cannot go see him until he "pays his debt".  He has gotten knee injections to left knee in the past with success, this is likely what he needs.  Did not prescribe narcotic pain medications.  Patient was not requesting narcotics, was agreeable to take naproxen and ice. New knee brace given today. Patient requested permission to stay in waiting room until bus came by at 5am tomorrow.   Final Clinical Impressions(s) / ED Diagnoses   Final diagnoses:  Chronic pain of left knee    New Prescriptions Discharge Medication List as of 11/16/2016  1:20 AM    START taking these medications   Details  naproxen (NAPROSYN) 500 MG tablet Take 1 tablet (500 mg total) by mouth 2 (two) times daily., Starting Mon 11/16/2016, Print         Kinnie Feil, PA-C 11/16/16 [place], PA-C 11/16/16 [place], MD 11/17/16 [phone]

## 2016-11-16 NOTE — ED Notes (Signed)
PA at bedside.

## 2016-11-16 NOTE — ED Triage Notes (Signed)
c/o left knee swelling and pain.  Injured knee in 1972 initially but states it started hurting this evening.  No swelling noted in triage.  Pt does have a knee sleeve on.

## 2016-11-18 DIAGNOSIS — F209 Schizophrenia, unspecified: Secondary | ICD-10-CM | POA: Diagnosis not present

## 2016-11-22 ENCOUNTER — Encounter (HOSPITAL_COMMUNITY): Payer: Self-pay

## 2016-11-22 ENCOUNTER — Emergency Department (HOSPITAL_COMMUNITY)
Admission: EM | Admit: 2016-11-22 | Discharge: 2016-11-23 | Disposition: A | Discharge status: Home / Self Care | Payer: Medicare Other | Attending: Emergency Medicine | Admitting: Emergency Medicine

## 2016-11-22 DIAGNOSIS — I110 Hypertensive heart disease with heart failure: Secondary | ICD-10-CM | POA: Diagnosis not present

## 2016-11-22 DIAGNOSIS — I5030 Unspecified diastolic (congestive) heart failure: Secondary | ICD-10-CM | POA: Insufficient documentation

## 2016-11-22 DIAGNOSIS — M25562 Pain in left knee: Secondary | ICD-10-CM | POA: Diagnosis not present

## 2016-11-22 DIAGNOSIS — Z7982 Long term (current) use of aspirin: Secondary | ICD-10-CM | POA: Diagnosis not present

## 2016-11-22 DIAGNOSIS — F17210 Nicotine dependence, cigarettes, uncomplicated: Secondary | ICD-10-CM | POA: Insufficient documentation

## 2016-11-22 DIAGNOSIS — G8929 Other chronic pain: Secondary | ICD-10-CM | POA: Diagnosis not present

## 2016-11-22 DIAGNOSIS — Z79899 Other long term (current) drug therapy: Secondary | ICD-10-CM | POA: Diagnosis not present

## 2016-11-22 NOTE — ED Triage Notes (Signed)
Pt denies any chest pain or SOB.  
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
## 2016-11-23 NOTE — ED Provider Notes (Signed)
[facility] DEPT Provider Note   CSN: [id number] Arrival date & time: 11/22/16  2130     History   Chief Complaint Chief Complaint  Patient presents with  . Knee Pain    HPI [name] is a 66 y.o. male.  Patient presents to the emergency department with chief complaint of acute on chronic left knee pain. He states that his knee pain has been exacerbated by the weather changes. He denies any new injury or trauma. He has been using a knee sleeve with some relief. He states that he is out of his pain medication, and requests additional pain medicine given at this time. He denies any fevers, chills, numbness, weakness, or tingling. His symptoms are worsened with the weather changes. He denies any other associated symptoms or modifying factors.   The history is provided by the patient. No language interpreter was used.    Past Medical History:  Diagnosis Date  . BPH (benign prostatic hyperplasia)   . Colon polyp 2010  . Depression   . GERD (gastroesophageal reflux disease)   . Hepatitis C    "caught it when I had a blood transfusion"  . High cholesterol   . History of blood transfusion    "when I was young"  . Hypertension   . Paranoid schizophrenia ([place])   . Prostate atrophy   . Retinal vein occlusion     Patient Active Problem List   Diagnosis Date Noted  . Back pain 09/14/2016  . Tobacco abuse   . Chest pain 08/20/2016  . Onychomycosis 01/17/2016  . Diastolic heart failure ([place]) 02/05/2015  . Hyperlipidemia 03/10/2014  . Patellar tendonitis 11/16/2012  . [place] OCCLUSION 06/03/2010  . PARANOID SCHIZOPHRENIA, CHRONIC 01/17/2009  . HYPERTENSION, BENIGN ESSENTIAL 01/17/2009  . BPH (benign prostatic hyperplasia) 01/17/2009  . HEPATITIS C 12/14/2008  . DEPRESSION 12/14/2008    Past Surgical History:  Procedure Laterality Date  . CIRCUMCISION  1959  . TONSILLECTOMY         Home Medications    Prior to Admission medications   Medication  Sig Start Date End Date Taking? Authorizing Provider  amLODipine (NORVASC) 10 MG tablet Take 1 tablet (10 mg total) by mouth daily. 08/23/16   Mercy Riding, MD  aspirin 81 MG tablet Take 1 tablet (81 mg total) by mouth daily. 09/24/14   [name], MD  atorvastatin (LIPITOR) 40 MG tablet Take 1 tablet (40 mg total) by mouth daily. 11/13/15   [name], MD  benztropine (COGENTIN) 1 MG tablet Take 1 tablet (1 mg total) by mouth 2 (two) times daily. 11/05/16   [name], MD  cetirizine (ZYRTEC) 10 MG tablet take 1 tablet by mouth once daily 02/20/15   [name], MD  fluPHENAZine decanoate (PROLIXIN) 25 MG/ML injection Inject 25 mg into the muscle every 14 (fourteen) days. Last dose 09/07/12    Historical Provider, MD  fluticasone (FLONASE) 50 MCG/ACT nasal spray Place 1 spray into both nostrils daily. 11/07/16   Shary Decamp, PA-C  guaiFENesin (MUCINEX) 600 MG 12 hr tablet Take 1 tablet (600 mg total) by mouth 2 (two) times daily. 11/07/16   Shary Decamp, PA-C  losartan (COZAAR) 100 MG tablet Take 1 tablet (100 mg total) by mouth daily. 10/07/16   [name], MD  Multiple Vitamin (MULTIVITAMIN WITH MINERALS) TABS tablet Take 1 tablet by mouth daily. 06/28/14   [name], MD  naproxen (NAPROSYN) 500 MG tablet Take 1 tablet (  500 mg total) by mouth 2 (two) times daily. 11/16/16   Kinnie Feil, PA-C  tamsulosin (FLOMAX) 0.4 MG CAPS capsule take 2 capsules by mouth once daily 09/28/16   [name], MD  traMADol (ULTRAM) 50 MG tablet Take 1 tablet (50 mg total) by mouth every 6 (six) hours as needed. 10/18/16   Malvin Johns, MD  triamcinolone cream (KENALOG) 0.1 % Apply 1 application topically 2 (two) times daily. For 5 days, then twice daily as needed for rash and itching. 01/03/15   [name], MD  vitamin C (ASCORBIC ACID) 500 MG tablet Take 500 mg by mouth daily.    Historical Provider, MD    Family History Family History  Problem Relation Age of  Onset  . Hypertension Mother   . Diabetes Mother   . Stroke Mother     Social History Social History  Substance Use Topics  . Smoking status: Current Every Day Smoker    Packs/day: 0.50    Years: 48.00    Types: Cigarettes  . Smokeless tobacco: Never Used  . Alcohol use No     Allergies   Lisinopril and Penicillins   Review of Systems Review of Systems  Musculoskeletal: Positive for arthralgias.  All other systems reviewed and are negative.    Physical Exam Updated Vital Signs BP 128/87   Pulse 68   Temp 97.7 F (36.5 C) (Oral)   Resp 16   SpO2 98%   Physical Exam Nursing note and vitals reviewed.  Constitutional: Pt appears well-developed and well-nourished. No distress.  HENT:  Head: Normocephalic and atraumatic.  Eyes: Conjunctivae are normal.  Neck: Normal range of motion.  Cardiovascular: Normal rate, regular rhythm. Intact distal pulses.   Capillary refill < 3 sec.  Pulmonary/Chest: Effort normal and breath sounds normal.  Musculoskeletal:  Left Knee Pt exhibits no focal tenderness, but does express some discomfort when the medial joint line is palpated.   ROM: 5/5  Strength: 5/5  Neurological: Pt  is alert. Coordination normal.  Sensation: 5/5 Skin: Skin is warm and dry. Pt is not diaphoretic.  No evidence of open wound or skin tenting No erythema or sign of infection or abscess Psychiatric: Pt has a normal mood and affect.     ED Treatments / Results  Labs (all labs ordered are listed, but only abnormal results are displayed) Labs Reviewed - No data to display  EKG  EKG Interpretation None       Radiology No results found.  Procedures Procedures (including critical care time)  Medications Ordered in ED Medications - No data to display   Initial Impression / Assessment and Plan / ED Course  I have reviewed the triage vital signs and the nursing notes.  Pertinent labs & imaging results that were available during my care of  the patient were reviewed by me and considered in my medical decision making (see chart for details).     Patient with acute on chronic left knee pain.  Pt advised to follow up with orthopedics. Patient advised continued supportive care until seen by ortho. Patient will be discharged home & is agreeable with above plan. Returns precautions discussed. Pt appears safe for discharge.   Final Clinical Impressions(s) / ED Diagnoses   Final diagnoses:  Chronic pain of left knee    New Prescriptions New Prescriptions   No medications on file     [name], PA-C 11/23/16 [place], MD 11/23/16 [phone]

## 2016-11-24 ENCOUNTER — Ambulatory Visit (INDEPENDENT_AMBULATORY_CARE_PROVIDER_SITE_OTHER): Payer: Medicare Other | Admitting: Family Medicine

## 2016-11-24 ENCOUNTER — Encounter: Payer: Self-pay | Admitting: Family Medicine

## 2016-11-24 DIAGNOSIS — G8929 Other chronic pain: Secondary | ICD-10-CM | POA: Diagnosis not present

## 2016-11-24 DIAGNOSIS — I200 Unstable angina: Secondary | ICD-10-CM

## 2016-11-24 DIAGNOSIS — M25562 Pain in left knee: Secondary | ICD-10-CM | POA: Diagnosis not present

## 2016-11-24 NOTE — Patient Instructions (Signed)
Nice to see you again today.  I am sorry to hear about your knee pain.  Your XRays were normal.  Be sure to follow-up with the Orthopedist as scheduled.  In the future, we can help with the knee pain and you do not need to go to the ER.  Take care, Dr. Jacinto Reap

## 2016-11-24 NOTE — Progress Notes (Signed)
   Subjective:   [name] is a 66 y.o. male with a history of HTN, diastolic heart failure, HLD, paranoid schizophrenia, tobacco abuse here for L knee pain  - Pain is in entire L knee - described as sharp pain - radiation: none - worse with walking, making sharp turns - better with rubbing down with an ointment - not sure what kind (maybe triamcinolone??) -seen in ED 5 times in last month - has been a problem since he was in the [employer] - seems to flare after cold weather or rain - is going to start back at school - was previously difficult to walk a lot due to knee pain - has previously gotten steroid injections in L knee that helped - does not want today - has appt with Ortho later this month - no numbness, weakness, fevers, swelling, redness  XRays 10/18/16 without acute abnormality  Review of Systems:  Per HPI.   Social History: current smoker 4-5 cig/day  Objective:  BP 110/74 (BP Location: Right Arm, Patient Position: Sitting, Cuff Size: Normal)   Pulse 86   Temp 98 F (36.7 C) (Oral)   Ht 5\' 9"  (1.753 m)   Wt 181 lb 9.6 oz (82.4 kg)   SpO2 99%   BMI 26.82 kg/m   Gen:  67 y.o. male in NAD HEENT: NCAT, MMM, anicteric sclerae CV: RRR, no MRG Resp: Non-labored, CTAB, no wheezes noted Ext: WWP, no edema MSK: L knee: Full ROM, strength intact, Negative Thessalys, ligaments with good endpoints, sensation intact to light touch, tenderness palpation diffusely, no swelling or erythema Neuro: Alert and oriented, speech normal, gait intact      Assessment & Plan:     JOUSHUA DUGAR is a 66 y.o. male here for   Chronic pain of left knee Likely has some OA that leads to chronic intermittent L knee pain Would avoid NSAIDs given CKD No indication of instability today Gait is intact Offered corticosteroid injection today, patient refused and wishes to wait until orthopedic visit   [name], MD MPH PGY-3,  [facility] 11/24/2016  4:27 PM

## 2016-11-24 NOTE — Assessment & Plan Note (Signed)
Likely has some OA that leads to chronic intermittent L knee pain Would avoid NSAIDs given CKD No indication of instability today Gait is intact Offered corticosteroid injection today, patient refused and wishes to wait until orthopedic visit

## 2016-11-26 ENCOUNTER — Emergency Department (HOSPITAL_COMMUNITY)
Admission: EM | Admit: 2016-11-26 | Discharge: 2016-11-26 | Disposition: A | Discharge status: Home / Self Care | Payer: Medicare Other | Attending: Emergency Medicine | Admitting: Emergency Medicine

## 2016-11-26 ENCOUNTER — Encounter (HOSPITAL_COMMUNITY): Payer: Self-pay | Admitting: Emergency Medicine

## 2016-11-26 DIAGNOSIS — Z79899 Other long term (current) drug therapy: Secondary | ICD-10-CM | POA: Insufficient documentation

## 2016-11-26 DIAGNOSIS — F17210 Nicotine dependence, cigarettes, uncomplicated: Secondary | ICD-10-CM | POA: Insufficient documentation

## 2016-11-26 DIAGNOSIS — Z7982 Long term (current) use of aspirin: Secondary | ICD-10-CM | POA: Insufficient documentation

## 2016-11-26 DIAGNOSIS — I10 Essential (primary) hypertension: Secondary | ICD-10-CM | POA: Diagnosis not present

## 2016-11-26 DIAGNOSIS — M25562 Pain in left knee: Secondary | ICD-10-CM | POA: Diagnosis not present

## 2016-11-26 DIAGNOSIS — G8929 Other chronic pain: Secondary | ICD-10-CM | POA: Diagnosis not present

## 2016-11-26 MED ORDER — ACETAMINOPHEN 500 MG PO TABS
1000.0000 mg | ORAL_TABLET | Freq: Once | ORAL | Status: AC
  Administered 2016-11-26: 1000 mg via ORAL
  Filled 2016-11-26: qty 2

## 2016-11-26 NOTE — Discharge Instructions (Signed)
You may take Tylenol 1000 mg every 6 hours as needed for pain. Please follow-up with your primary care provider in orthopedics as scheduled.
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
## 2016-11-26 NOTE — ED Provider Notes (Signed)
TIME SEEN: 4:51 AM  CHIEF COMPLAINT: Chronic left knee pain  HPI: Patient is a 66 year old male with history of hepatitis C, paranoid schizophrenia who presents to the emergency department with complaints of chronic left knee pain that he has had for many years. No new injury. Able to ambulate without any difficulty. No numbness, tingling or focal weakness. No swelling. No redness or warmth. No cold or blue extremity. He has been seen several times for the same and states he has an appointment with an orthopedic physician. Patient states that he is here because "I'll just make me feel good".  ROS: See HPI Constitutional: no fever  Eyes: no drainage  ENT: no runny nose   Cardiovascular:  no chest pain  Resp: no SOB  GI: no vomiting GU: no dysuria Integumentary: no rash  Allergy: no hives  Musculoskeletal: no leg swelling  Neurological: no slurred speech ROS otherwise negative  PAST MEDICAL HISTORY/PAST SURGICAL HISTORY:  Past Medical History:  Diagnosis Date  . BPH (benign prostatic hyperplasia)   . Colon polyp 2010  . Depression   . GERD (gastroesophageal reflux disease)   . Hepatitis C    "caught it when I had a blood transfusion"  . High cholesterol   . History of blood transfusion    "when I was young"  . Hypertension   . Paranoid schizophrenia ([place])   . Prostate atrophy   . Retinal vein occlusion     MEDICATIONS:  Prior to Admission medications   Medication Sig Start Date End Date Taking? Authorizing Provider  amLODipine (NORVASC) 10 MG tablet Take 1 tablet (10 mg total) by mouth daily. 08/23/16   Mercy Riding, MD  aspirin 81 MG tablet Take 1 tablet (81 mg total) by mouth daily. 09/24/14   [name], MD  atorvastatin (LIPITOR) 40 MG tablet Take 1 tablet (40 mg total) by mouth daily. 11/13/15   [name], MD  benztropine (COGENTIN) 1 MG tablet Take 1 tablet (1 mg total) by mouth 2 (two) times daily. 11/05/16   [name], MD  cetirizine (ZYRTEC) 10 MG  tablet take 1 tablet by mouth once daily 02/20/15   [name], MD  fluPHENAZine decanoate (PROLIXIN) 25 MG/ML injection Inject 25 mg into the muscle every 14 (fourteen) days. Last dose 09/07/12    Historical Provider, MD  fluticasone (FLONASE) 50 MCG/ACT nasal spray Place 1 spray into both nostrils daily. 11/07/16   Shary Decamp, PA-C  guaiFENesin (MUCINEX) 600 MG 12 hr tablet Take 1 tablet (600 mg total) by mouth 2 (two) times daily. 11/07/16   Shary Decamp, PA-C  losartan (COZAAR) 100 MG tablet Take 1 tablet (100 mg total) by mouth daily. 10/07/16   [name], MD  Multiple Vitamin (MULTIVITAMIN WITH MINERALS) TABS tablet Take 1 tablet by mouth daily. 06/28/14   [name], MD  naproxen (NAPROSYN) 500 MG tablet Take 1 tablet (500 mg total) by mouth 2 (two) times daily. 11/16/16   Kinnie Feil, PA-C  tamsulosin (FLOMAX) 0.4 MG CAPS capsule take 2 capsules by mouth once daily 09/28/16   [name], MD  traMADol (ULTRAM) 50 MG tablet Take 1 tablet (50 mg total) by mouth every 6 (six) hours as needed. 10/18/16   Malvin Johns, MD  triamcinolone cream (KENALOG) 0.1 % Apply 1 application topically 2 (two) times daily. For 5 days, then twice daily as needed for rash and itching. 01/03/15   [name], MD  vitamin C (  ASCORBIC ACID) 500 MG tablet Take 500 mg by mouth daily.    Historical Provider, MD    ALLERGIES:  Allergies  Allergen Reactions  . Lisinopril Other (See Comments)    Cough  . Penicillins Hives    SOCIAL HISTORY:  Social History  Substance Use Topics  . Smoking status: Current Every Day Smoker    Packs/day: 0.50    Years: 48.00    Types: Cigarettes  . Smokeless tobacco: Never Used  . Alcohol use No    FAMILY HISTORY: Family History  Problem Relation Age of Onset  . Hypertension Mother   . Diabetes Mother   . Stroke Mother     EXAM: BP (!) 144/94 (BP Location: Right Arm)   Pulse (!) 106   Temp 98.1 F (36.7 C) (Oral)   Resp (!) 22    SpO2 99%  CONSTITUTIONAL: Alert and oriented and responds appropriately to questions. Well-appearing; well-nourished HEAD: Normocephalic EYES: Conjunctivae clear, pupils appear equal, EOMI ENT: normal nose; moist mucous membranes NECK: Supple, no meningismus, no nuchal rigidity, no LAD  CARD: RRR; S1 and S2 appreciated; no murmurs, no clicks, no rubs, no gallops RESP: Normal chest excursion without splinting or tachypnea; breath sounds clear and equal bilaterally; no wheezes, no rhonchi, no rales, no hypoxia or respiratory distress, speaking full sentences ABD/GI: Normal bowel sounds; non-distended; soft, non-tender, no rebound, no guarding, no peritoneal signs, no hepatosplenomegaly BACK:  The back appears normal and is non-tender to palpation, there is no CVA tenderness EXT: Patient complaining of left knee pain. No joint effusion. No tenderness on exam. He has full active motion in this joint. Normal gait. No ligamentous laxity. Leg is warm and well-perfused. No erythema or warmth. Normal ROM in all joints; non-tender to palpation; no edema; normal capillary refill; no cyanosis, no calf tenderness or swelling    SKIN: Normal color for age and race; warm; no rash NEURO: Moves all extremities equally, normal sensation diffusely, cranial nerves II through XII intact, normal gait PSYCH: The patient's mood and manner are appropriate. Grooming and personal hygiene are appropriate.  MEDICAL DECISION MAKING: Patient here with complaints of chronic left knee pain. I do not feel he needs emergent imaging. He reports his orthopedic follow-up scheduled. Have offered him Tylenol for pain which he agrees to. I feel the patient is here frequently because of his mental illness and that he has some inability to understand the appropriate use of the emergency department. I do not think that he is drug-seeking. He is very kind, cooperative and states "y'all just make me feel good". Have explained to him that this is  not the appropriate place to return for treatment for chronic conditions and have discussed at length symptoms including signs of infection, arterial or venous obstruction, new injury that would warm and reevaluation in the emergency department. He verbalizes understanding.  At this time, I do not feel there is any life-threatening condition present. I have reviewed and discussed all results (EKG, imaging, lab, urine as appropriate) and exam findings with patient/family. I have reviewed nursing notes and appropriate previous records.  I feel the patient is safe to be discharged home without further emergent workup and can continue workup as an outpatient as needed. Discussed usual and customary return precautions. Patient/family verbalize understanding and are comfortable with this plan.  Outpatient follow-up has been provided if needed. All questions have been answered.     [place], DO 11/26/16 [phone]

## 2016-12-02 DIAGNOSIS — F209 Schizophrenia, unspecified: Secondary | ICD-10-CM | POA: Diagnosis not present

## 2016-12-03 ENCOUNTER — Other Ambulatory Visit: Payer: Self-pay | Admitting: Family Medicine

## 2016-12-14 ENCOUNTER — Encounter (HOSPITAL_COMMUNITY): Payer: Self-pay | Admitting: Vascular Surgery

## 2016-12-14 DIAGNOSIS — M25562 Pain in left knee: Secondary | ICD-10-CM | POA: Diagnosis not present

## 2016-12-14 DIAGNOSIS — G8929 Other chronic pain: Secondary | ICD-10-CM | POA: Insufficient documentation

## 2016-12-14 DIAGNOSIS — Z760 Encounter for issue of repeat prescription: Secondary | ICD-10-CM | POA: Diagnosis not present

## 2016-12-14 DIAGNOSIS — F17210 Nicotine dependence, cigarettes, uncomplicated: Secondary | ICD-10-CM | POA: Diagnosis not present

## 2016-12-14 DIAGNOSIS — I110 Hypertensive heart disease with heart failure: Secondary | ICD-10-CM | POA: Diagnosis not present

## 2016-12-14 DIAGNOSIS — Z7982 Long term (current) use of aspirin: Secondary | ICD-10-CM | POA: Insufficient documentation

## 2016-12-14 DIAGNOSIS — Z79899 Other long term (current) drug therapy: Secondary | ICD-10-CM | POA: Insufficient documentation

## 2016-12-14 DIAGNOSIS — I5030 Unspecified diastolic (congestive) heart failure: Secondary | ICD-10-CM | POA: Insufficient documentation
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
## 2016-12-15 ENCOUNTER — Emergency Department (HOSPITAL_COMMUNITY)
Admission: EM | Admit: 2016-12-15 | Discharge: 2016-12-15 | Disposition: A | Discharge status: Home / Self Care | Payer: Medicare Other | Attending: Emergency Medicine | Admitting: Emergency Medicine

## 2016-12-15 DIAGNOSIS — M25562 Pain in left knee: Secondary | ICD-10-CM

## 2016-12-15 DIAGNOSIS — G8929 Other chronic pain: Secondary | ICD-10-CM

## 2016-12-15 DIAGNOSIS — M1712 Unilateral primary osteoarthritis, left knee: Secondary | ICD-10-CM | POA: Diagnosis not present

## 2016-12-15 MED ORDER — TRAMADOL HCL 50 MG PO TABS
50.0000 mg | ORAL_TABLET | Freq: Once | ORAL | Status: AC
  Administered 2016-12-15: 50 mg via ORAL
  Filled 2016-12-15: qty 1

## 2016-12-15 MED ORDER — HYDROXYZINE HCL 25 MG PO TABS: 25.0000 mg | ORAL_TABLET | Freq: Four times a day (QID) | ORAL | 0 refills | Status: DC

## 2016-12-15 MED ORDER — TRAMADOL HCL 50 MG PO TABS: 50.0000 mg | ORAL_TABLET | Freq: Four times a day (QID) | ORAL | 0 refills | Status: DC | PRN
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
## 2016-12-16 DIAGNOSIS — I110 Hypertensive heart disease with heart failure: Secondary | ICD-10-CM | POA: Insufficient documentation

## 2016-12-16 DIAGNOSIS — I5030 Unspecified diastolic (congestive) heart failure: Secondary | ICD-10-CM | POA: Insufficient documentation

## 2016-12-16 DIAGNOSIS — G8929 Other chronic pain: Secondary | ICD-10-CM | POA: Insufficient documentation

## 2016-12-16 DIAGNOSIS — F17210 Nicotine dependence, cigarettes, uncomplicated: Secondary | ICD-10-CM | POA: Insufficient documentation

## 2016-12-16 DIAGNOSIS — Z79899 Other long term (current) drug therapy: Secondary | ICD-10-CM | POA: Insufficient documentation

## 2016-12-16 DIAGNOSIS — M25572 Pain in left ankle and joints of left foot: Secondary | ICD-10-CM | POA: Diagnosis not present

## 2016-12-16 DIAGNOSIS — S99912A Unspecified injury of left ankle, initial encounter: Secondary | ICD-10-CM | POA: Diagnosis not present

## 2016-12-16 DIAGNOSIS — F209 Schizophrenia, unspecified: Secondary | ICD-10-CM | POA: Diagnosis not present

## 2016-12-16 DIAGNOSIS — M25562 Pain in left knee: Secondary | ICD-10-CM | POA: Insufficient documentation

## 2016-12-17 ENCOUNTER — Encounter (HOSPITAL_COMMUNITY): Payer: Self-pay

## 2016-12-17 ENCOUNTER — Emergency Department (HOSPITAL_COMMUNITY): Payer: Medicare Other

## 2016-12-17 ENCOUNTER — Emergency Department (HOSPITAL_COMMUNITY)
Admission: EM | Admit: 2016-12-17 | Discharge: 2016-12-17 | Disposition: A | Discharge status: Home / Self Care | Payer: Medicare Other | Attending: Emergency Medicine | Admitting: Emergency Medicine

## 2016-12-17 DIAGNOSIS — X501XXA Overexertion from prolonged static or awkward postures, initial encounter: Secondary | ICD-10-CM | POA: Insufficient documentation

## 2016-12-17 DIAGNOSIS — I110 Hypertensive heart disease with heart failure: Secondary | ICD-10-CM

## 2016-12-17 DIAGNOSIS — Y929 Unspecified place or not applicable: Secondary | ICD-10-CM

## 2016-12-17 DIAGNOSIS — G8929 Other chronic pain: Secondary | ICD-10-CM

## 2016-12-17 DIAGNOSIS — Y999 Unspecified external cause status: Secondary | ICD-10-CM

## 2016-12-17 DIAGNOSIS — M25562 Pain in left knee: Secondary | ICD-10-CM

## 2016-12-17 DIAGNOSIS — Y939 Activity, unspecified: Secondary | ICD-10-CM | POA: Insufficient documentation

## 2016-12-17 DIAGNOSIS — M25572 Pain in left ankle and joints of left foot: Secondary | ICD-10-CM | POA: Insufficient documentation

## 2016-12-17 DIAGNOSIS — F17210 Nicotine dependence, cigarettes, uncomplicated: Secondary | ICD-10-CM | POA: Insufficient documentation

## 2016-12-17 DIAGNOSIS — I5030 Unspecified diastolic (congestive) heart failure: Secondary | ICD-10-CM

## 2016-12-17 DIAGNOSIS — Z7982 Long term (current) use of aspirin: Secondary | ICD-10-CM

## 2016-12-17 DIAGNOSIS — Z79899 Other long term (current) drug therapy: Secondary | ICD-10-CM
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
## 2016-12-17 NOTE — ED Provider Notes (Signed)
[facility] DEPT Provider Note   CSN: [id number] Arrival date & time: 12/16/16  2347     History   Chief Complaint Chief Complaint  Patient presents with  . Knee Pain    HPI [name] is a 66 y.o. male.  HPI  66 y.o. male presents to the Emergency Department today because he had an injection done in his left knee by Dr. Theda Sers and wanted to make sure everything was alright. Notes no pain to knee. NO falls. No trauma. States that his knee gave away and wanted to see if it was alight. No fevers. No numbness/tingling. No other symptoms noted    Past Medical History:  Diagnosis Date  . BPH (benign prostatic hyperplasia)   . Colon polyp 2010  . Depression   . GERD (gastroesophageal reflux disease)   . Hepatitis C    "caught it when I had a blood transfusion"  . High cholesterol   . History of blood transfusion    "when I was young"  . Hypertension   . Paranoid schizophrenia ([place])   . Prostate atrophy   . Retinal vein occlusion     Patient Active Problem List   Diagnosis Date Noted  . Chronic pain of left knee 11/24/2016  . Tobacco abuse   . Onychomycosis 01/17/2016  . Diastolic heart failure ([place]) 02/05/2015  . Hyperlipidemia 03/10/2014  . Patellar tendonitis 11/16/2012  . [place] OCCLUSION 06/03/2010  . PARANOID SCHIZOPHRENIA, CHRONIC 01/17/2009  . HYPERTENSION, BENIGN ESSENTIAL 01/17/2009  . BPH (benign prostatic hyperplasia) 01/17/2009  . HEPATITIS C 12/14/2008  . DEPRESSION 12/14/2008    Past Surgical History:  Procedure Laterality Date  . CIRCUMCISION  1959  . TONSILLECTOMY         Home Medications    Prior to Admission medications   Medication Sig Start Date End Date Taking? Authorizing Provider  amLODipine (NORVASC) 10 MG tablet Take 1 tablet (10 mg total) by mouth daily. 08/23/16   Mercy Riding, MD  aspirin 81 MG tablet Take 1 tablet (81 mg total) by mouth daily. 09/24/14   [name], MD  atorvastatin (LIPITOR) 40 MG  tablet Take 1 tablet (40 mg total) by mouth daily. 11/13/15   [name], MD  benztropine (COGENTIN) 1 MG tablet Take 1 tablet (1 mg total) by mouth 2 (two) times daily. 11/05/16   [name], MD  cetirizine (ZYRTEC) 10 MG tablet take 1 tablet by mouth once daily 02/20/15   [name], MD  fluPHENAZine decanoate (PROLIXIN) 25 MG/ML injection Inject 25 mg into the muscle every 14 (fourteen) days. Last dose 09/07/12    Historical Provider, MD  fluticasone (FLONASE) 50 MCG/ACT nasal spray Place 1 spray into both nostrils daily. 11/07/16   Shary Decamp, PA-C  guaiFENesin (MUCINEX) 600 MG 12 hr tablet Take 1 tablet (600 mg total) by mouth 2 (two) times daily. 11/07/16   Shary Decamp, PA-C  hydrochlorothiazide (MICROZIDE) 12.5 MG capsule take 1 capsule by mouth once daily 12/03/16   [name], MD  hydrOXYzine (ATARAX/VISTARIL) 25 MG tablet Take 1 tablet (25 mg total) by mouth every 6 (six) hours. 12/15/16   Shary Decamp, PA-C  losartan (COZAAR) 100 MG tablet Take 1 tablet (100 mg total) by mouth daily. 10/07/16   [name], MD  Multiple Vitamin (MULTIVITAMIN WITH MINERALS) TABS tablet Take 1 tablet by mouth daily. 06/28/14   [name], MD  naproxen (NAPROSYN) 500 MG tablet Take 1 tablet (  500 mg total) by mouth 2 (two) times daily. 11/16/16   Kinnie Feil, PA-C  tamsulosin (FLOMAX) 0.4 MG CAPS capsule take 2 capsules by mouth once daily 09/28/16   [name], MD  traMADol (ULTRAM) 50 MG tablet Take 1 tablet (50 mg total) by mouth every 6 (six) hours as needed. 12/15/16   Shary Decamp, PA-C  triamcinolone cream (KENALOG) 0.1 % Apply 1 application topically 2 (two) times daily. For 5 days, then twice daily as needed for rash and itching. 01/03/15   [name], MD  vitamin C (ASCORBIC ACID) 500 MG tablet Take 500 mg by mouth daily.    Historical Provider, MD    Family History Family History  Problem Relation Age of Onset  . Hypertension Mother   . Diabetes  Mother   . Stroke Mother     Social History Social History  Substance Use Topics  . Smoking status: Current Every Day Smoker    Packs/day: 0.50    Years: 48.00    Types: Cigarettes  . Smokeless tobacco: Never Used  . Alcohol use No     Allergies   Lisinopril and Penicillins   Review of Systems Review of Systems  Constitutional: Negative for fever.  Gastrointestinal: Negative for nausea and vomiting.  Musculoskeletal: Negative for arthralgias.  Neurological: Negative for numbness.   Physical Exam Updated Vital Signs BP (!) 160/92   Pulse 63   Temp 97.8 F (36.6 C) (Oral)   Resp 20   Ht 5\' 9"  (1.753 m)   Wt 81.2 kg   SpO2 98%   BMI 26.43 kg/m   Physical Exam  Constitutional: He is oriented to person, place, and time. Vital signs are normal. He appears well-developed and well-nourished.  HENT:  Head: Normocephalic.  Right Ear: Hearing normal.  Left Ear: Hearing normal.  Eyes: Conjunctivae and EOM are normal. Pupils are equal, round, and reactive to light.  Cardiovascular: Normal rate and regular rhythm.   Pulmonary/Chest: Effort normal.  Musculoskeletal:  Left Knee Negative anterior/poster drawer bilaterally. Negative ballottement test. No varus or valgus laxity. No crepitus. No pain with flexion or extension. No TTP of knees or ankles.   Neurological: He is alert and oriented to person, place, and time.  Skin: Skin is warm and dry.  Psychiatric: He has a normal mood and affect. His speech is normal and behavior is normal. Thought content normal.  Nursing note and vitals reviewed.    ED Treatments / Results  Labs (all labs ordered are listed, but only abnormal results are displayed) Labs Reviewed - No data to display  EKG  EKG Interpretation None       Radiology Dg Knee Complete 4 Views Left  Result Date: 12/17/2016 CLINICAL DATA:  Persistent nontraumatic anterior knee pain EXAM: LEFT KNEE - COMPLETE 4+ VIEW COMPARISON:  10/18/2016 FINDINGS: No  evidence of fracture, dislocation, or joint effusion. No evidence of arthropathy or other focal bone abnormality. Soft tissues are unremarkable. IMPRESSION: Negative. Electronically Signed   By: Andreas Newport M.D.   On: 12/17/2016 00:33    Procedures Procedures (including critical care time)  Medications Ordered in ED Medications - No data to display   Initial Impression / Assessment and Plan / ED Course  I have reviewed the triage vital signs and the nursing notes.  Pertinent labs & imaging results that were available during my care of the patient were reviewed by me and considered in my medical decision making (see chart for details).  Final Clinical Impressions(s) / ED Diagnoses   {I have reviewed and evaluated the relevant imaging studies.  {I have reviewed the relevant previous healthcare records.  {I obtained HPI from historian.   ED Course:  Assessment: Pt with chronic knee pain. Injection today and wanted to make sure his knee is ok. Patient X-Ray negative for obvious fracture or dislocation.  Pt advised to follow up with orthopedics. Conservative therapy recommended and discussed. Patient will be discharged home & is agreeable with above plan. Returns precautions discussed. Pt appears safe for discharge.  Disposition/Plan:  DC Home Additional Verbal discharge instructions given and discussed with patient.  Pt Instructed to f/u with Ortho in the next week for evaluation and treatment of symptoms. Return precautions given Pt acknowledges and agrees with plan  Supervising Physician Veryl Speak, MD  Final diagnoses:  Chronic pain of left knee    New Prescriptions New Prescriptions   No medications on file     Shary Decamp, PA-C 12/17/16 [place], MD 12/17/16 [phone]

## 2016-12-17 NOTE — ED Notes (Signed)
States he had his left knee injected yest by Dr. Theda Sers and states last pm his knee gave away. So he comes in wanting it checked to make sure everything is alright.

## 2016-12-18 ENCOUNTER — Encounter (HOSPITAL_COMMUNITY): Payer: Self-pay | Admitting: *Deleted

## 2016-12-18 ENCOUNTER — Emergency Department (HOSPITAL_COMMUNITY): Payer: Medicare Other

## 2016-12-18 ENCOUNTER — Emergency Department (HOSPITAL_COMMUNITY)
Admission: EM | Admit: 2016-12-18 | Discharge: 2016-12-18 | Disposition: A | Discharge status: Home / Self Care | Payer: Medicare Other | Source: Home / Self Care | Attending: Emergency Medicine | Admitting: Emergency Medicine

## 2016-12-18 DIAGNOSIS — S99912A Unspecified injury of left ankle, initial encounter: Secondary | ICD-10-CM | POA: Diagnosis not present

## 2016-12-18 DIAGNOSIS — M25562 Pain in left knee: Secondary | ICD-10-CM | POA: Diagnosis not present

## 2016-12-18 DIAGNOSIS — M25572 Pain in left ankle and joints of left foot: Secondary | ICD-10-CM | POA: Diagnosis not present

## 2016-12-18 DIAGNOSIS — G8929 Other chronic pain: Secondary | ICD-10-CM

## 2016-12-18 NOTE — ED Triage Notes (Signed)
Pt ambulatory to triage room. PT says he has been having left knee pain (chronic), had injections on Tuesday in his knee. Pt is having "aching" in his knee. Pt also reports he was going down steps earlier today and feels like he twisted his left ankle. No meds PTA.

## 2016-12-18 NOTE — ED Provider Notes (Signed)
[name]   CSN: [id number] Arrival date & time: 12/17/16  2354     History   Chief Complaint Chief Complaint  Patient presents with  . Knee Pain    HPI [name] is a 66 y.o. male who presents to the ED with chronic left knee pain and also with left ankle pain. Patient was evaluated yesterday for the knee pain. Patient reports that today he felt like he twisted his ankle and wanted that checked also. Patient sleeping in room and it was difficult to get him to give a history and to turn on his back in order for me to examine his knees.  The patient has had multiple visits to the ED for chronic knee pain with the most recent visit yesterday   HPI  Past Medical History:  Diagnosis Date  . BPH (benign prostatic hyperplasia)   . Colon polyp 2010  . Depression   . GERD (gastroesophageal reflux disease)   . Hepatitis C    "caught it when I had a blood transfusion"  . High cholesterol   . History of blood transfusion    "when I was young"  . Hypertension   . Paranoid schizophrenia ([place])   . Prostate atrophy   . Retinal vein Lowe     Patient Active Problem List   Diagnosis Date Noted  . Chronic pain of left knee 11/24/2016  . Tobacco abuse   . Onychomycosis 01/17/2016  . Diastolic heart failure ([place]) 02/05/2015  . Hyperlipidemia 03/10/2014  . Patellar tendonitis 11/16/2012  . [name] 06/03/2010  . PARANOID SCHIZOPHRENIA, CHRONIC 01/17/2009  . HYPERTENSION, BENIGN ESSENTIAL 01/17/2009  . BPH (benign prostatic hyperplasia) 01/17/2009  . HEPATITIS C 12/14/2008  . DEPRESSION 12/14/2008    Past Surgical History:  Procedure Laterality Date  . CIRCUMCISION  1959  . TONSILLECTOMY         Home Medications    Prior to Admission medications   Medication Sig Start Date End Date Taking? Authorizing Provider  amLODipine (NORVASC) 10 MG tablet Take 1 tablet (10 mg total) by mouth daily. 08/23/16   Mercy Riding, MD    aspirin 81 MG tablet Take 1 tablet (81 mg total) by mouth daily. 09/24/14   [name], MD  atorvastatin (LIPITOR) 40 MG tablet Take 1 tablet (40 mg total) by mouth daily. 11/13/15   [name], MD  benztropine (COGENTIN) 1 MG tablet Take 1 tablet (1 mg total) by mouth 2 (two) times daily. 11/05/16   [name], MD  cetirizine (ZYRTEC) 10 MG tablet take 1 tablet by mouth once daily 02/20/15   [name], MD  fluPHENAZine decanoate (PROLIXIN) 25 MG/ML injection Inject 25 mg into the muscle every 14 (fourteen) days. Last dose 09/07/12    Historical Provider, MD  fluticasone (FLONASE) 50 MCG/ACT nasal spray Place 1 spray into both nostrils daily. 11/07/16   Shary Decamp, PA-C  guaiFENesin (MUCINEX) 600 MG 12 hr tablet Take 1 tablet (600 mg total) by mouth 2 (two) times daily. 11/07/16   Shary Decamp, PA-C  hydrochlorothiazide (MICROZIDE) 12.5 MG capsule take 1 capsule by mouth once daily 12/03/16   [name], MD  hydrOXYzine (ATARAX/VISTARIL) 25 MG tablet Take 1 tablet (25 mg total) by mouth every 6 (six) hours. 12/15/16   Shary Decamp, PA-C  losartan (COZAAR) 100 MG tablet Take 1 tablet (100 mg total) by mouth daily. 10/07/16   [name], MD  Multiple Vitamin (  MULTIVITAMIN WITH MINERALS) TABS tablet Take 1 tablet by mouth daily. 06/28/14   [name], MD  naproxen (NAPROSYN) 500 MG tablet Take 1 tablet (500 mg total) by mouth 2 (two) times daily. 11/16/16   Kinnie Feil, PA-C  tamsulosin (FLOMAX) 0.4 MG CAPS capsule take 2 capsules by mouth once daily 09/28/16   [name], MD  traMADol (ULTRAM) 50 MG tablet Take 1 tablet (50 mg total) by mouth every 6 (six) hours as needed. 12/15/16   Shary Decamp, PA-C  triamcinolone cream (KENALOG) 0.1 % Apply 1 application topically 2 (two) times daily. For 5 days, then twice daily as needed for rash and itching. 01/03/15   [name], MD  vitamin C (ASCORBIC ACID) 500 MG tablet Take 500 mg by mouth daily.     Historical Provider, MD    Family History Family History  Problem Relation Age of Onset  . Hypertension Mother   . Diabetes Mother   . Stroke Mother     Social History Social History  Substance Use Topics  . Smoking status: Current Every Day Smoker    Packs/day: 0.50    Years: 48.00    Types: Cigarettes  . Smokeless tobacco: Never Used  . Alcohol use No     Allergies   Lisinopril and Penicillins   Review of Systems Review of Systems  Musculoskeletal: Positive for arthralgias.       Left knee and back.   Skin: Negative for wound.  Neurological: Negative for syncope.     Physical Exam Updated Vital Signs BP (!) 159/97 (BP Location: Right Arm)   Pulse 60   Temp 98.1 F (36.7 C) (Oral)   Resp 16   SpO2 100%   Physical Exam  Constitutional: He is oriented to person, place, and time. He appears well-developed and well-nourished.  HENT:  Head: Normocephalic.  Eyes: EOM are normal.  Neck: Neck supple.  Pulmonary/Chest: Effort normal.  Abdominal: Soft. There is no tenderness.  Musculoskeletal: Normal range of motion.       Left knee: He exhibits normal range of motion, no swelling, no ecchymosis, no deformity, no laceration, no erythema, normal alignment and normal patellar mobility. No tenderness found.  I was able to do full range of motion of the knee and ankle without the patient complaining of pain. The patient is wearing an elastic knee brace on both knees that are below the knees. After exam, knee sleeves reapplied to proper location for knee support. Pedal pulses 2+, adequate circulation.   Neurological: He is alert and oriented to person, place, and time. No cranial nerve deficit.  Skin: Skin is warm and dry.  Nursing Lowe and vitals reviewed.    ED Treatments / Results  Labs (all labs ordered are listed, but only abnormal results are displayed) Labs Reviewed - No data to display  Radiology Dg Ankle Complete Left  Result Date: 12/18/2016 CLINICAL  DATA:  Persistent pain after twisting injury while going down steps today EXAM: LEFT ANKLE COMPLETE - 3+ VIEW COMPARISON:  None. FINDINGS: There is no evidence of fracture, dislocation, or joint effusion. There is no evidence of arthropathy or other focal bone abnormality. Soft tissues are unremarkable. IMPRESSION: Negative. Electronically Signed   By: Andreas Newport M.D.   On: 12/18/2016 01:12   Dg Knee Complete 4 Views Left  Result Date: 12/17/2016 CLINICAL DATA:  Persistent nontraumatic anterior knee pain EXAM: LEFT KNEE - COMPLETE 4+ VIEW COMPARISON:  10/18/2016 FINDINGS: No evidence of  fracture, dislocation, or joint effusion. No evidence of arthropathy or other focal bone abnormality. Soft tissues are unremarkable. IMPRESSION: Negative. Electronically Signed   By: Andreas Newport M.D.   On: 12/17/2016 00:33    Procedures Procedures (including critical care time)  Medications Ordered in ED Medications - No data to display   Initial Impression / Assessment and Plan / ED Course  I have reviewed the triage vital signs and the nursing notes.  Pertinent imaging results that were available during my care of the patient were reviewed by me and considered in my medical decision making (see chart for details).   Final Clinical Impressions(s) / ED Diagnoses  66 y.o. male with chronic knee pain stable for d/c without focal neuro deficits. Patient ambulatory without difficulty. He is to f/u with his orthopedic doctor   Final diagnoses:  Left knee pain, unspecified chronicity  Chronic pain of left ankle    New Prescriptions New Prescriptions   No medications on file     [facility], NP 12/18/16 [place], MD 12/19/16 [phone]

## 2016-12-18 NOTE — Discharge Instructions (Signed)
Wear the knee braces as needed.

## 2016-12-20 ENCOUNTER — Emergency Department (HOSPITAL_COMMUNITY)
Admission: EM | Admit: 2016-12-20 | Discharge: 2016-12-20 | Disposition: A | Discharge status: Home / Self Care | Payer: Medicare Other | Attending: Emergency Medicine | Admitting: Emergency Medicine

## 2016-12-20 ENCOUNTER — Encounter (HOSPITAL_COMMUNITY): Payer: Self-pay

## 2016-12-20 DIAGNOSIS — Z7982 Long term (current) use of aspirin: Secondary | ICD-10-CM | POA: Insufficient documentation

## 2016-12-20 DIAGNOSIS — G8929 Other chronic pain: Secondary | ICD-10-CM | POA: Insufficient documentation

## 2016-12-20 DIAGNOSIS — I10 Essential (primary) hypertension: Secondary | ICD-10-CM | POA: Diagnosis not present

## 2016-12-20 DIAGNOSIS — M25562 Pain in left knee: Secondary | ICD-10-CM | POA: Diagnosis not present

## 2016-12-20 DIAGNOSIS — Z79899 Other long term (current) drug therapy: Secondary | ICD-10-CM | POA: Insufficient documentation

## 2016-12-20 DIAGNOSIS — F17210 Nicotine dependence, cigarettes, uncomplicated: Secondary | ICD-10-CM | POA: Diagnosis not present

## 2016-12-20 NOTE — Discharge Instructions (Signed)
Use heat on the sore area 3 or 4 times a day.   Follow up with your orthopedist as needed for problems

## 2016-12-20 NOTE — ED Provider Notes (Signed)
[facility] DEPT Provider Note   CSN: [id number] Arrival date & time: 12/20/16  1805   By signing my name below, I, Soijett Blue, attest that this documentation has been prepared under the direction and in the presence of Daleen Bo, MD. Electronically Signed: Soijett Blue, ED Scribe. 12/20/16. 6:50 PM.  History   Chief Complaint Chief Complaint  Patient presents with  . Knee Pain    HPI [name] is a 66 y.o. male with a PMHx of chronic left knee pain, schizophrenia, who presents to the Emergency Department complaining of left knee pain onset 5 days ago. Pt has tried ace wrap with relief of his symptoms. Pt reports that Dr. Hart Robinsons gave him an injection to his knee 5 days ago. He denies left knee swelling, color change, wound, and any other symptoms.   The history is provided by the patient. No language interpreter was used.    Past Medical History:  Diagnosis Date  . BPH (benign prostatic hyperplasia)   . Colon polyp 2010  . Depression   . GERD (gastroesophageal reflux disease)   . Hepatitis C    "caught it when I had a blood transfusion"  . High cholesterol   . History of blood transfusion    "when I was young"  . Hypertension   . Paranoid schizophrenia ([place])   . Prostate atrophy   . Retinal vein occlusion     Patient Active Problem List   Diagnosis Date Noted  . Chronic pain of left knee 11/24/2016  . Tobacco abuse   . Onychomycosis 01/17/2016  . Diastolic heart failure ([place]) 02/05/2015  . Hyperlipidemia 03/10/2014  . Patellar tendonitis 11/16/2012  . [place] OCCLUSION 06/03/2010  . PARANOID SCHIZOPHRENIA, CHRONIC 01/17/2009  . HYPERTENSION, BENIGN ESSENTIAL 01/17/2009  . BPH (benign prostatic hyperplasia) 01/17/2009  . HEPATITIS C 12/14/2008  . DEPRESSION 12/14/2008    Past Surgical History:  Procedure Laterality Date  . CIRCUMCISION  1959  . TONSILLECTOMY         Home Medications    Prior to Admission medications     Medication Sig Start Date End Date Taking? Authorizing Provider  amLODipine (NORVASC) 10 MG tablet Take 1 tablet (10 mg total) by mouth daily. 08/23/16   Mercy Riding, MD  aspirin 81 MG tablet Take 1 tablet (81 mg total) by mouth daily. 09/24/14   [name], MD  atorvastatin (LIPITOR) 40 MG tablet Take 1 tablet (40 mg total) by mouth daily. 11/13/15   [name], MD  benztropine (COGENTIN) 1 MG tablet Take 1 tablet (1 mg total) by mouth 2 (two) times daily. 11/05/16   [name], MD  cetirizine (ZYRTEC) 10 MG tablet take 1 tablet by mouth once daily 02/20/15   [name], MD  fluPHENAZine decanoate (PROLIXIN) 25 MG/ML injection Inject 25 mg into the muscle every 14 (fourteen) days. Last dose 09/07/12    [provider]  fluticasone (FLONASE) 50 MCG/ACT nasal spray Place 1 spray into both nostrils daily. 11/07/16   Shary Decamp, PA-C  guaiFENesin (MUCINEX) 600 MG 12 hr tablet Take 1 tablet (600 mg total) by mouth 2 (two) times daily. 11/07/16   Shary Decamp, PA-C  hydrochlorothiazide (MICROZIDE) 12.5 MG capsule take 1 capsule by mouth once daily 12/03/16   [name], MD  hydrOXYzine (ATARAX/VISTARIL) 25 MG tablet Take 1 tablet (25 mg total) by mouth every 6 (six) hours. 12/15/16   Shary Decamp, PA-C  losartan (COZAAR) 100 MG  tablet Take 1 tablet (100 mg total) by mouth daily. 10/07/16   [name], MD  Multiple Vitamin (MULTIVITAMIN WITH MINERALS) TABS tablet Take 1 tablet by mouth daily. 06/28/14   [name], MD  naproxen (NAPROSYN) 500 MG tablet Take 1 tablet (500 mg total) by mouth 2 (two) times daily. 11/16/16   Kinnie Feil, PA-C  tamsulosin (FLOMAX) 0.4 MG CAPS capsule take 2 capsules by mouth once daily 09/28/16   Bacigalupo, Dionne Bucy, MD  traMADol (ULTRAM) 50 MG tablet Take 1 tablet (50 mg total) by mouth every 6 (six) hours as needed. 12/15/16   Shary Decamp, PA-C  triamcinolone cream (KENALOG) 0.1 % Apply 1 application topically 2  (two) times daily. For 5 days, then twice daily as needed for rash and itching. 01/03/15   [name], MD  vitamin C (ASCORBIC ACID) 500 MG tablet Take 500 mg by mouth daily.    [provider]    Family History Family History  Problem Relation Age of Onset  . Hypertension Mother   . Diabetes Mother   . Stroke Mother     Social History Social History  Substance Use Topics  . Smoking status: Current Every Day Smoker    Packs/day: 0.50    Years: 48.00    Types: Cigarettes  . Smokeless tobacco: Never Used  . Alcohol use No     Allergies   Lisinopril and Penicillins   Review of Systems Review of Systems  Musculoskeletal: Positive for arthralgias (left knee). Negative for joint swelling.  Skin: Negative for color change and wound.  All other systems reviewed and are negative.    Physical Exam Updated Vital Signs BP (!) 147/86 (BP Location: Right Arm)   Pulse 68   Temp 98.6 F (37 C) (Oral)   Resp 18   SpO2 97%   Physical Exam  Constitutional: He is oriented to person, place, and time. He appears well-developed and well-nourished. No distress.  HENT:  Head: Normocephalic and atraumatic.  Eyes: EOM are normal.  Neck: Neck supple.  Cardiovascular: Normal rate.   Pulmonary/Chest: Effort normal. No respiratory distress.  Musculoskeletal: Normal range of motion.       Left knee: He exhibits normal range of motion, no effusion and no deformity.  Left knee band aid in placed, removed. No drainage or bleeding. No effusion or deformity. Fair active ROM. No instability. Nl gait.  Neurological: He is alert and oriented to person, place, and time.  Skin: Skin is warm and dry.  Psychiatric: He has a normal mood and affect. His behavior is normal.  Nursing note and vitals reviewed.    ED Treatments / Results  DIAGNOSTIC STUDIES: Oxygen Saturation is 97% on RA, nl by my interpretation.    COORDINATION OF CARE: 6:46 PM Discussed treatment plan with pt at  bedside and pt agreed to plan.   Procedures Procedures (including critical care time)  Medications Ordered in ED Medications - No data to display   Initial Impression / Assessment and Plan / ED Course  I have reviewed the triage vital signs and the nursing notes.   Medications - No data to display  No data found.   At discharge- reevaluation with update and discussion. After initial assessment and treatment, an updated evaluation reveals he is comfortable and ambulates easily.  Findings discussed with the patient and all questions answered. Izaan Kingbird L   Final Clinical Impressions(s) / ED Diagnoses   Final diagnoses:  Chronic pain of left  knee   Nonspecific chronic left knee pain.  Doubt fracture, or internal derangement.  Nursing Notes Reviewed/ Care Coordinated Applicable Imaging Reviewed Interpretation of Laboratory Data incorporated into ED treatment  The patient appears reasonably screened and/or stabilized for discharge and I doubt any other medical condition or other [facility] requiring further screening, evaluation, or treatment in the ED at this time prior to discharge.  Plan: Home Medications-APAP for pain; Home Treatments-heat and compression as needed; return here if the recommended treatment, does not improve the symptoms; Recommended follow up-PCP as needed   New Prescriptions Discharge Medication List as of 12/20/2016  6:47 PM     I personally performed the services described in this documentation, which was scribed in my presence. The recorded information has been reviewed and is accurate.        Daleen Bo, MD 12/23/16 [phone]

## 2016-12-20 NOTE — ED Notes (Signed)
Pt unable to sign d/t chart being locked, verbalized understanding to written and verbal instructions

## 2016-12-20 NOTE — ED Notes (Signed)
Called for pt in lobby, no response

## 2016-12-20 NOTE — ED Triage Notes (Signed)
Pt c/o chronic left knee pain.  Pt ambulated to triage without difficulty.  Pt has not taken any recent pain meds.

## 2016-12-28 ENCOUNTER — Ambulatory Visit: Payer: Medicare Other | Admitting: Cardiovascular Disease

## 2016-12-30 DIAGNOSIS — F209 Schizophrenia, unspecified: Secondary | ICD-10-CM | POA: Diagnosis not present

## 2017-01-12 DIAGNOSIS — K7030 Alcoholic cirrhosis of liver without ascites: Secondary | ICD-10-CM | POA: Diagnosis not present

## 2017-01-12 DIAGNOSIS — Z1211 Encounter for screening for malignant neoplasm of colon: Secondary | ICD-10-CM | POA: Diagnosis not present

## 2017-01-12 DIAGNOSIS — Z86010 Personal history of colonic polyps: Secondary | ICD-10-CM | POA: Diagnosis not present

## 2017-01-17 ENCOUNTER — Other Ambulatory Visit: Payer: Self-pay

## 2017-01-17 ENCOUNTER — Emergency Department (HOSPITAL_COMMUNITY): Payer: Medicare Other

## 2017-01-17 ENCOUNTER — Emergency Department (HOSPITAL_COMMUNITY)
Admission: EM | Admit: 2017-01-17 | Discharge: 2017-01-17 | Disposition: A | Discharge status: Home / Self Care | Payer: Medicare Other | Attending: Emergency Medicine | Admitting: Emergency Medicine

## 2017-01-17 ENCOUNTER — Encounter (HOSPITAL_COMMUNITY): Payer: Self-pay

## 2017-01-17 DIAGNOSIS — F17210 Nicotine dependence, cigarettes, uncomplicated: Secondary | ICD-10-CM | POA: Diagnosis not present

## 2017-01-17 DIAGNOSIS — J189 Pneumonia, unspecified organism: Secondary | ICD-10-CM

## 2017-01-17 DIAGNOSIS — I10 Essential (primary) hypertension: Secondary | ICD-10-CM | POA: Diagnosis not present

## 2017-01-17 DIAGNOSIS — R42 Dizziness and giddiness: Secondary | ICD-10-CM | POA: Diagnosis not present

## 2017-01-17 DIAGNOSIS — J181 Lobar pneumonia, unspecified organism: Secondary | ICD-10-CM | POA: Diagnosis not present

## 2017-01-17 DIAGNOSIS — R509 Fever, unspecified: Secondary | ICD-10-CM | POA: Diagnosis present

## 2017-01-17 DIAGNOSIS — M791 Myalgia: Secondary | ICD-10-CM | POA: Insufficient documentation

## 2017-01-17 DIAGNOSIS — Z79899 Other long term (current) drug therapy: Secondary | ICD-10-CM | POA: Diagnosis not present

## 2017-01-17 LAB — URINALYSIS, ROUTINE W REFLEX MICROSCOPIC
Bilirubin Urine: NEGATIVE
Glucose, UA: NEGATIVE mg/dL
Hgb urine dipstick: NEGATIVE
Ketones, ur: NEGATIVE mg/dL
Leukocytes, UA: NEGATIVE
Nitrite: NEGATIVE
Protein, ur: NEGATIVE mg/dL
Specific Gravity, Urine: 1.014 (ref 1.005–1.030)
pH: 6 (ref 5.0–8.0)

## 2017-01-17 LAB — CBC WITH DIFFERENTIAL/PLATELET
Basophils Absolute: 0 10*3/uL (ref 0.0–0.1)
Basophils Relative: 0 %
Eosinophils Absolute: 0 10*3/uL (ref 0.0–0.7)
Eosinophils Relative: 0 %
HCT: 40.5 % (ref 39.0–52.0)
Hemoglobin: 14 g/dL (ref 13.0–17.0)
Lymphocytes Relative: 15 %
Lymphs Abs: 1.4 10*3/uL (ref 0.7–4.0)
MCH: 30.2 pg (ref 26.0–34.0)
MCHC: 34.6 g/dL (ref 30.0–36.0)
MCV: 87.5 fL (ref 78.0–100.0)
Monocytes Absolute: 0.6 10*3/uL (ref 0.1–1.0)
Monocytes Relative: 7 %
Neutro Abs: 6.9 10*3/uL (ref 1.7–7.7)
Neutrophils Relative %: 78 %
Platelets: 114 10*3/uL — ABNORMAL LOW (ref 150–400)
RBC: 4.63 MIL/uL (ref 4.22–5.81)
RDW: 14.1 % (ref 11.5–15.5)
WBC: 8.8 10*3/uL (ref 4.0–10.5)

## 2017-01-17 LAB — COMPREHENSIVE METABOLIC PANEL
ALT: 19 U/L (ref 17–63)
AST: 30 U/L (ref 15–41)
Albumin: 3.7 g/dL (ref 3.5–5.0)
Alkaline Phosphatase: 62 U/L (ref 38–126)
Anion gap: 9 (ref 5–15)
BUN: 10 mg/dL (ref 6–20)
CO2: 25 mmol/L (ref 22–32)
Calcium: 8.6 mg/dL — ABNORMAL LOW (ref 8.9–10.3)
Chloride: 101 mmol/L (ref 101–111)
Creatinine, Ser: 1.61 mg/dL — ABNORMAL HIGH (ref 0.61–1.24)
GFR calc Af Amer: 50 mL/min — ABNORMAL LOW (ref >60)
GFR calc non Af Amer: 43 mL/min — ABNORMAL LOW (ref >60)
Glucose, Bld: 163 mg/dL — ABNORMAL HIGH (ref 65–99)
Potassium: 3.7 mmol/L (ref 3.5–5.1)
Sodium: 135 mmol/L (ref 135–145)
Total Bilirubin: 1.2 mg/dL (ref 0.3–1.2)
Total Protein: 6.8 g/dL (ref 6.5–8.1)

## 2017-01-17 LAB — I-STAT CG4 LACTIC ACID, ED
Lactic Acid, Venous: 1.33 mmol/L (ref 0.5–1.9)
Lactic Acid, Venous: 0.69 mmol/L (ref 0.5–1.9)

## 2017-01-17 LAB — TROPONIN I: Troponin I: <0.03 ng/mL (ref <0.03)

## 2017-01-17 MED ORDER — SODIUM CHLORIDE 0.9 % IV BOLUS (SEPSIS)
1000.0000 mL | Freq: Once | INTRAVENOUS | Status: AC
  Administered 2017-01-17: 1000 mL via INTRAVENOUS

## 2017-01-17 MED ORDER — ACETAMINOPHEN 325 MG PO TABS
650.0000 mg | ORAL_TABLET | Freq: Once | ORAL | Status: AC | PRN
  Administered 2017-01-17: 650 mg via ORAL
  Filled 2017-01-17: qty 2

## 2017-01-17 MED ORDER — AZITHROMYCIN 250 MG PO TABS: 250.0000 mg | ORAL_TABLET | Freq: Every day | ORAL | 0 refills | Status: DC

## 2017-01-17 MED ORDER — AZITHROMYCIN 250 MG PO TABS
500.0000 mg | ORAL_TABLET | Freq: Once | ORAL | Status: AC
  Administered 2017-01-17: 500 mg via ORAL
  Filled 2017-01-17: qty 2

## 2017-01-17 MED ORDER — ACETAMINOPHEN 500 MG PO TABS: 500.0000 mg | ORAL_TABLET | Freq: Four times a day (QID) | ORAL | 0 refills | Status: DC | PRN

## 2017-01-17 NOTE — ED Notes (Signed)
Pt understood dc material. NAD noted. Script given at dc  

## 2017-01-17 NOTE — ED Provider Notes (Signed)
[facility] DEPT Provider Note   CSN: [id number] Arrival date & time: 01/17/17  1447     History   Chief Complaint Chief Complaint  Patient presents with  . Fever    HPI [name] is a 66 y.o. male with history of schizophrenia, hypertension, GERD, BPH who presents with a one-day history of fever, generalized body aches, and lightheadedness with standing. Patient reports his lightheadedness improves once he begins walking.patient notes a headache along with this lightheadedness intermittently. Patient has also had a mild nonproductive cough for the past few days. He denies any chest pain, shortness of breath, abdominal pain, nausea, vomiting, urinary symptoms. Patient takes a daily aspirin, but has not taken any other medicine or has fever or symptoms. Patient has not been recently hospitalized.  HPI  Past Medical History:  Diagnosis Date  . BPH (benign prostatic hyperplasia)   . Colon polyp 2010  . Depression   . GERD (gastroesophageal reflux disease)   . Hepatitis C    "caught it when I had a blood transfusion"  . High cholesterol   . History of blood transfusion    "when I was young"  . Hypertension   . Paranoid schizophrenia ([place])   . Prostate atrophy   . Retinal vein occlusion     Patient Active Problem List   Diagnosis Date Noted  . Chronic pain of left knee 11/24/2016  . Tobacco abuse   . Onychomycosis 01/17/2016  . Diastolic heart failure ([place]) 02/05/2015  . Hyperlipidemia 03/10/2014  . Patellar tendonitis 11/16/2012  . [place] OCCLUSION 06/03/2010  . PARANOID SCHIZOPHRENIA, CHRONIC 01/17/2009  . HYPERTENSION, BENIGN ESSENTIAL 01/17/2009  . BPH (benign prostatic hyperplasia) 01/17/2009  . HEPATITIS C 12/14/2008  . DEPRESSION 12/14/2008    Past Surgical History:  Procedure Laterality Date  . CIRCUMCISION  1959  . TONSILLECTOMY         Home Medications    Prior to Admission medications   Medication Sig Start Date End Date  Taking? Authorizing Provider  acetaminophen (TYLENOL) 500 MG tablet Take 1 tablet (500 mg total) by mouth every 6 (six) hours as needed. 01/17/17   Esteban Kobashigawa, Bea Graff, PA-C  amLODipine (NORVASC) 10 MG tablet Take 1 tablet (10 mg total) by mouth daily. 08/23/16   Mercy Riding, MD  aspirin 81 MG tablet Take 1 tablet (81 mg total) by mouth daily. 09/24/14   [name], MD  atorvastatin (LIPITOR) 40 MG tablet Take 1 tablet (40 mg total) by mouth daily. 11/13/15   [name], MD  azithromycin (ZITHROMAX) 250 MG tablet Take 1 tablet (250 mg total) by mouth daily. 01/17/17   Katerine Morua, Bea Graff, PA-C  benztropine (COGENTIN) 1 MG tablet Take 1 tablet (1 mg total) by mouth 2 (two) times daily. 11/05/16   [name], MD  cetirizine (ZYRTEC) 10 MG tablet take 1 tablet by mouth once daily 02/20/15   [name], MD  fluPHENAZine decanoate (PROLIXIN) 25 MG/ML injection Inject 25 mg into the muscle every 14 (fourteen) days. Last dose 09/07/12    [provider]  fluticasone (FLONASE) 50 MCG/ACT nasal spray Place 1 spray into both nostrils daily. 11/07/16   Shary Decamp, PA-C  guaiFENesin (MUCINEX) 600 MG 12 hr tablet Take 1 tablet (600 mg total) by mouth 2 (two) times daily. 11/07/16   Shary Decamp, PA-C  hydrochlorothiazide (MICROZIDE) 12.5 MG capsule take 1 capsule by mouth once daily 12/03/16   Brita Romp Dionne Bucy, MD  hydrOXYzine (ATARAX/VISTARIL)  25 MG tablet Take 1 tablet (25 mg total) by mouth every 6 (six) hours. 12/15/16   Shary Decamp, PA-C  losartan (COZAAR) 100 MG tablet Take 1 tablet (100 mg total) by mouth daily. 10/07/16   [name], MD  Multiple Vitamin (MULTIVITAMIN WITH MINERALS) TABS tablet Take 1 tablet by mouth daily. 06/28/14   [name], MD  naproxen (NAPROSYN) 500 MG tablet Take 1 tablet (500 mg total) by mouth 2 (two) times daily. 11/16/16   Kinnie Feil, PA-C  tamsulosin (FLOMAX) 0.4 MG CAPS capsule take 2 capsules by mouth once daily 09/28/16    Bacigalupo, Dionne Bucy, MD  traMADol (ULTRAM) 50 MG tablet Take 1 tablet (50 mg total) by mouth every 6 (six) hours as needed. 12/15/16   Shary Decamp, PA-C  triamcinolone cream (KENALOG) 0.1 % Apply 1 application topically 2 (two) times daily. For 5 days, then twice daily as needed for rash and itching. 01/03/15   [name], MD  vitamin C (ASCORBIC ACID) 500 MG tablet Take 500 mg by mouth daily.    [provider]    Family History Family History  Problem Relation Age of Onset  . Hypertension Mother   . Diabetes Mother   . Stroke Mother     Social History Social History  Substance Use Topics  . Smoking status: Current Every Day Smoker    Packs/day: 0.50    Years: 48.00    Types: Cigarettes  . Smokeless tobacco: Never Used  . Alcohol use No     Allergies   Lisinopril and Penicillins   Review of Systems Review of Systems  Constitutional: Positive for fever. Negative for chills.  HENT: Negative for facial swelling and sore throat.   Respiratory: Positive for cough. Negative for shortness of breath.   Cardiovascular: Negative for chest pain.  Gastrointestinal: Negative for abdominal pain, nausea and vomiting.  Genitourinary: Negative for dysuria.  Musculoskeletal: Positive for myalgias. Negative for back pain.  Skin: Negative for rash and wound.  Neurological: Positive for light-headedness and headaches.  Psychiatric/Behavioral: The patient is not nervous/anxious.      Physical Exam Updated Vital Signs BP (!) 139/94   Pulse 72   Temp 99.9 F (37.7 C) (Oral)   Resp 19   Ht 5\' 9"  (1.753 m)   Wt 78.9 kg (174 lb)   SpO2 99%   BMI 25.70 kg/m   Physical Exam  Constitutional: He appears well-developed and well-nourished. No distress.  HENT:  Head: Normocephalic and atraumatic.  Mouth/Throat: Oropharynx is clear and moist. No oropharyngeal exudate.  Eyes: Conjunctivae and EOM are normal. Pupils are equal, round, and reactive to light. Right eye exhibits  no discharge. Left eye exhibits no discharge. No scleral icterus.  Neck: Normal range of motion. Neck supple. No thyromegaly present.  Cardiovascular: Normal rate, regular rhythm, normal heart sounds and intact distal pulses.  Exam reveals no gallop and no friction rub.   No murmur heard. Pulmonary/Chest: Effort normal. No stridor. No respiratory distress. He has no wheezes. He has rales (bilateral bases).  Abdominal: Soft. Bowel sounds are normal. He exhibits no distension. There is no tenderness. There is no rebound and no guarding.  Musculoskeletal: He exhibits no edema.  Lymphadenopathy:    He has no cervical adenopathy.  Neurological: He is alert. Coordination normal.  CN 3-12 intact; normal sensation throughout; 5/5 strength in all 4 extremities; equal bilateral grip strength; no ataxia on finger to nose  Skin: Skin is warm and  dry. No rash noted. He is not diaphoretic. No pallor.  Psychiatric: He has a normal mood and affect.  Nursing note and vitals reviewed.    ED Treatments / Results  Labs (all labs ordered are listed, but only abnormal results are displayed) Labs Reviewed  COMPREHENSIVE METABOLIC PANEL - Abnormal; Notable for the following:       Result Value   Glucose, Bld 163 (*)    Creatinine, Ser 1.61 (*)    Calcium 8.6 (*)    GFR calc non Af Amer 43 (*)    GFR calc Af Amer 50 (*)    All other components within normal limits  CBC WITH DIFFERENTIAL/PLATELET - Abnormal; Notable for the following:    Platelets 114 (*)    All other components within normal limits  URINALYSIS, ROUTINE W REFLEX MICROSCOPIC  TROPONIN I  I-STAT CG4 LACTIC ACID, ED  I-STAT CG4 LACTIC ACID, ED    EKG  EKG Interpretation  Date/Time:  Sunday January 17 2017 14:59:59 EDT Ventricular Rate:  97 PR Interval:  126 QRS Duration: 88 QT Interval:  324 QTC Calculation: 411 R Axis:   107 Text Interpretation:  Normal sinus rhythm with sinus arrhythmia Rightward axis ST & T wave abnormality,  consider inferolateral ischemia Abnormal ECG worsening t wave inversions inferiorly.  t wave changes lateraly are old. Confirmed by Isla Pence [phone]) on 01/17/2017 3:35:48 PM       Radiology Dg Chest 2 View  Result Date: 01/17/2017 CLINICAL DATA:  Patient with fever and dizziness. EXAM: CHEST  2 VIEW COMPARISON:  Chest radiograph 08/20/2016 FINDINGS: Normal cardiac and mediastinal contours. Peripheral heterogeneous opacities left lower lung. No pleural effusion or pneumothorax. Regional skeleton is unremarkable. IMPRESSION: Peripheral heterogeneous opacities left lung base may represent pneumonia in the appropriate clinical setting. Followup PA and lateral chest X-ray is recommended in 3-4 weeks following trial of antibiotic therapy to ensure resolution and exclude underlying malignancy. Electronically Signed   By: Lovey Newcomer M.D.   On: 01/17/2017 15:20    Procedures Procedures (including critical care time)  Medications Ordered in ED Medications  acetaminophen (TYLENOL) tablet 650 mg (650 mg Oral Given 01/17/17 1605)  sodium chloride 0.9 % bolus 1,000 mL (0 mLs Intravenous Stopped 01/17/17 1729)  sodium chloride 0.9 % bolus 1,000 mL (0 mLs Intravenous Stopped 01/17/17 1923)  azithromycin (ZITHROMAX) tablet 500 mg (500 mg Oral Given 01/17/17 1946)     Initial Impression / Assessment and Plan / ED Course  I have reviewed the triage vital signs and the nursing notes.  Pertinent labs & imaging results that were available during my care of the patient were reviewed by me and considered in my medical decision making (see chart for details).     Patient with opacities in the left lung base concerning for pneumonia. Considering patient's fever and cough, this is likely. CBC shows platelets 114K. CMP shows glucose 163, creatinine 1.61. Lactate 1.33, troponin negative. UA is negative. Patient initially orthostatic, however this was improved after 2 L of normal saline. Patient also reporting that his  symptoms of lightheadedness with standing improved as well. First dose of azithromycin given in the ED. Discharge home with azithromycin. Patient to follow up with PCP this week for recheck. Return precautions discussed. Patient encouraged to stay hydrated. Patient understands and agrees with plan. Patient discharged in satisfactory condition. Patient also evaluated by Dr. Gilford Raid who guided the patient's management and agrees with plan.  Final Clinical Impressions(s) / ED Diagnoses  Final diagnoses:  Community acquired pneumonia of left lower lobe of lung ([place])    New Prescriptions Discharge Medication List as of 01/17/2017  7:41 PM    START taking these medications   Details  acetaminophen (TYLENOL) 500 MG tablet Take 1 tablet (500 mg total) by mouth every 6 (six) hours as needed., Starting Sun 01/17/2017, Print    azithromycin (ZITHROMAX) 250 MG tablet Take 1 tablet (250 mg total) by mouth daily., Starting Sun 01/17/2017, Print         Cadell Gabrielson, Bea Graff, PA-C 01/17/17 2039    Isla Pence, MD 01/17/17 2218

## 2017-01-17 NOTE — Discharge Instructions (Signed)
Medications: Azithromycin, Tylenol  Treatment: Take azithromycin once daily for 4 days. You have received your first dose in the emergency department today. Make sure to drink plenty of water, at least 8 glasses daily. Take Tylenol every 4-6 hours for your fever and body aches. You can also alternate with ibuprofen as prescribed over-the-counter.  Follow-up: Please follow-up with your primary care provider in 3-4 days for recheck of your symptoms. Please return to emergency department if you develop any new or worsening symptoms including chest pain or shortness of breath, passing out, or any other concerning symptoms.

## 2017-01-17 NOTE — ED Triage Notes (Signed)
Per Pt, Pt is coming from home with complaints of dizziness, generalized body aches, and fever since yesterday. Pt denies any N/V/D, SOB, CP. Pt reports a slight cough, but denies it being productive.

## 2017-01-20 DIAGNOSIS — F209 Schizophrenia, unspecified: Secondary | ICD-10-CM | POA: Diagnosis not present

## 2017-01-21 ENCOUNTER — Ambulatory Visit (INDEPENDENT_AMBULATORY_CARE_PROVIDER_SITE_OTHER): Payer: Medicare Other | Admitting: Family Medicine

## 2017-01-21 ENCOUNTER — Encounter: Payer: Self-pay | Admitting: Family Medicine

## 2017-01-21 VITALS — BP 124/78 | HR 81 | Temp 97.8°F | Wt 176.8 lb

## 2017-01-21 DIAGNOSIS — M25562 Pain in left knee: Secondary | ICD-10-CM

## 2017-01-21 DIAGNOSIS — Z720 Tobacco use: Secondary | ICD-10-CM | POA: Diagnosis not present

## 2017-01-21 DIAGNOSIS — I200 Unstable angina: Secondary | ICD-10-CM | POA: Diagnosis not present

## 2017-01-21 DIAGNOSIS — E7800 Pure hypercholesterolemia, unspecified: Secondary | ICD-10-CM | POA: Diagnosis not present

## 2017-01-21 DIAGNOSIS — I10 Essential (primary) hypertension: Secondary | ICD-10-CM

## 2017-01-21 DIAGNOSIS — J181 Lobar pneumonia, unspecified organism: Secondary | ICD-10-CM

## 2017-01-21 DIAGNOSIS — G8929 Other chronic pain: Secondary | ICD-10-CM | POA: Diagnosis not present

## 2017-01-21 DIAGNOSIS — J189 Pneumonia, unspecified organism: Secondary | ICD-10-CM | POA: Insufficient documentation

## 2017-01-21 MED ORDER — LOSARTAN POTASSIUM 100 MG PO TABS: 100.0000 mg | ORAL_TABLET | Freq: Every day | ORAL | 5 refills | Status: DC

## 2017-01-21 MED ORDER — ATORVASTATIN CALCIUM 40 MG PO TABS
40.0000 mg | ORAL_TABLET | Freq: Every day | ORAL | 3 refills | Status: DC
Start: 2017-01-21 — End: 2017-02-10

## 2017-01-21 MED ORDER — AMLODIPINE BESYLATE 10 MG PO TABS: 10.0000 mg | ORAL_TABLET | Freq: Every day | ORAL | 5 refills | Status: DC

## 2017-01-21 NOTE — Assessment & Plan Note (Signed)
Well-controlled Continue amlodipine and losartan current doses Recent BMP stable Follow-up in 3 months

## 2017-01-21 NOTE — Patient Instructions (Signed)
Steps to Quit Smoking Smoking tobacco can be bad for your health. It can also affect almost every organ in your body. Smoking puts you and people around you at risk for many serious long-lasting (chronic) diseases. Quitting smoking is hard, but it is one of the best things that you can do for your health. It is never too late to quit. What are the benefits of quitting smoking? When you quit smoking, you lower your risk for getting serious diseases and conditions. They can include:  Lung cancer or lung disease.  Heart disease.  Stroke.  Heart attack.  Not being able to have children (infertility).  Weak bones (osteoporosis) and broken bones (fractures).  If you have coughing, wheezing, and shortness of breath, those symptoms may get better when you quit. You may also get sick less often. If you are pregnant, quitting smoking can help to lower your chances of having a baby of low birth weight. What can I do to help me quit smoking? Talk with your doctor about what can help you quit smoking. Some things you can do (strategies) include:  Quitting smoking totally, instead of slowly cutting back how much you smoke over a period of time.  Going to in-person counseling. You are more likely to quit if you go to many counseling sessions.  Using resources and support systems, such as: ? Online chats with a counselor. ? Phone quitlines. ? Printed self-help materials. ? Support groups or group counseling. ? Text messaging programs. ? Mobile phone apps or applications.  Taking medicines. Some of these medicines may have nicotine in them. If you are pregnant or breastfeeding, do not take any medicines to quit smoking unless your doctor says it is okay. Talk with your doctor about counseling or other things that can help you.  Talk with your doctor about using more than one strategy at the same time, such as taking medicines while you are also going to in-person counseling. This can help make  quitting easier. What things can I do to make it easier to quit? Quitting smoking might feel very hard at first, but there is a lot that you can do to make it easier. Take these steps:  Talk to your family and friends. Ask them to support and encourage you.  Call phone quitlines, reach out to support groups, or work with a counselor.  Ask people who smoke to not smoke around you.  Avoid places that make you want (trigger) to smoke, such as: ? Bars. ? Parties. ? Smoke-break areas at work.  Spend time with people who do not smoke.  Lower the stress in your life. Stress can make you want to smoke. Try these things to help your stress: ? Getting regular exercise. ? Deep-breathing exercises. ? Yoga. ? Meditating. ? Doing a body scan. To do this, close your eyes, focus on one area of your body at a time from head to toe, and notice which parts of your body are tense. Try to relax the muscles in those areas.  Download or buy apps on your mobile phone or tablet that can help you stick to your quit plan. There are many free apps, such as QuitGuide from the CDC (Centers for Disease Control and Prevention). You can find more support from smokefree.gov and other websites.  This information is not intended to replace advice given to you by your health care provider. Make sure you discuss any questions you have with your health care provider. Document Released: 05/30/2009 Document   Revised: 03/31/2016 Document Reviewed: 12/18/2014 Elsevier Interactive Patient Education  2018 Elsevier Inc.  

## 2017-01-21 NOTE — Progress Notes (Signed)
Subjective:   [name] is a 66 y.o. male with a history of HTN, diastolic heart failure, BPH, Hep C, HLD, chronic left knee pain here for  Chief Complaint  Patient presents with  . Follow-up    pneumonia/ knee pain     HTN: - Medications: amlodipine 10 mg daily, losartan 100 mg daily - Compliance: Good - Checking BP at home: No - Denies any SOB, CP, vision changes, LE edema, medication SEs, or symptoms of hypotension - Diet: No table salt, but does eat canned food about 3 times weekly - Exercise: None currently - did cut grass and sandblast a deck today  L knee pain - has been seen in ED for L knee pain 10 times in last 3 months for L knee pain - he reports he is also followed by Ortho (Dr Theda Sers) - got steroid injection ~1 mont ago which seems to have helped - knee pain is now much better  CAP - diagnosed in ED with CAP on 01/17/17 after finding LLL infiltrate on CXR in setting of fever and cough.   - treated with Azithromycin - currently on day 5/5 - no more fevers, cough is improving, no CP, SOB   Review of Systems:  Per HPI.   Social History: current smoker - cut back to 3/day  Objective:  BP 124/78   Pulse 81   Temp 97.8 F (36.6 C) (Oral)   Wt 176 lb 12.8 oz (80.2 kg)   SpO2 97%   BMI 26.11 kg/m   Gen:  66 y.o. male in NAD HEENT: NCAT, MMM, EOMI, PERRL, anicteric sclerae, dentures in place CV: RRR, no MRG Resp: Non-labored, CTAB, no wheezes noted Ext: WWP, no edema MSK: No obvious deformities, knee ROM intact, gait intact, no knee TTP Neuro: Alert and oriented, speech normal Psych: Appropriate groom and dress       Chemistry      Component Value Date/Time   NA 135 01/17/2017 1454   K 3.7 01/17/2017 1454   CL 101 01/17/2017 1454   CO2 25 01/17/2017 1454   BUN 10 01/17/2017 1454   CREATININE 1.61 (H) 01/17/2017 1454   CREATININE 1.49 (H) 08/27/2016 1412      Component Value Date/Time   CALCIUM 8.6 (L) 01/17/2017 1454   ALKPHOS 62 01/17/2017  1454   AST 30 01/17/2017 1454   ALT 19 01/17/2017 1454   BILITOT 1.2 01/17/2017 1454      Lab Results  Component Value Date   WBC 8.8 01/17/2017   HGB 14.0 01/17/2017   HCT 40.5 01/17/2017   MCV 87.5 01/17/2017   PLT 114 (L) 01/17/2017   Lab Results  Component Value Date   TSH 0.856 08/21/2016   Lab Results  Component Value Date   HGBA1C 5.4 08/21/2016   Assessment & Plan:     [name] is a 66 y.o. male here for   HYPERTENSION, BENIGN ESSENTIAL Well-controlled Continue amlodipine and losartan current doses Recent BMP stable Follow-up in 3 months  CAP (community acquired pneumonia) Symptoms are resolving Finish azithromycin today as ordered Consider repeat chest x-ray to ensure resolution of infiltrate in about 8 weeks  Tobacco abuse Counseled on health benefits of quitting Patient continues to try to cut back on his smoking and wishes to quit in this manner  Chronic pain of left knee Patient is following with orthopedics Advised him to avoid going back to the emergency department for his chronic knee pain   [name],  Dionne Bucy, MD MPH PGY-3,  [facility] 01/21/2017  2:16 PM

## 2017-01-21 NOTE — Assessment & Plan Note (Signed)
Symptoms are resolving Finish azithromycin today as ordered Consider repeat chest x-ray to ensure resolution of infiltrate in about 8 weeks

## 2017-01-21 NOTE — Assessment & Plan Note (Signed)
Patient is following with orthopedics Advised him to avoid going back to the emergency department for his chronic knee pain

## 2017-01-21 NOTE — Assessment & Plan Note (Signed)
Counseled on health benefits of quitting Patient continues to try to cut back on his smoking and wishes to quit in this manner

## 2017-02-03 DIAGNOSIS — F209 Schizophrenia, unspecified: Secondary | ICD-10-CM | POA: Diagnosis not present

## 2017-02-10 ENCOUNTER — Other Ambulatory Visit: Payer: Self-pay | Admitting: Family Medicine

## 2017-02-10 DIAGNOSIS — E7800 Pure hypercholesterolemia, unspecified: Secondary | ICD-10-CM

## 2017-02-13 ENCOUNTER — Other Ambulatory Visit: Payer: Self-pay | Admitting: Family Medicine

## 2017-02-17 ENCOUNTER — Telehealth: Payer: Self-pay | Admitting: Family Medicine

## 2017-02-17 NOTE — Telephone Encounter (Deleted)
Attempted to contact Mr. Blash on both Home and Mobile number. His mother in law answered the Home phone and informed me he was not home and was not sure when he would be back today. I informed her that I had a question regarding his medication request.  I am unsure of whether he is still taking the HCTZ since per chart review it seems his previous PCP recommended just he take amlodipine 10 mg daily and losartan 100 mg daily.   Need to clarify before refilling prescription.  Thank you,  [name], DO

## 2017-02-17 NOTE — Telephone Encounter (Signed)
Attempted to contact [name] on both Home and Mobile number. His mother in law answered the Home phone and informed me he was not home and was not sure when he would be back today. I informed her that I had a question regarding his medication request.  I am unsure of whether he is still taking the HCTZ since per chart review it seems his previous PCP recommended just he take amlodipine 10 mg daily and losartan 100 mg daily.   Need to clarify before refilling prescription.  Thank you,  [name], DO

## 2017-02-23 DIAGNOSIS — K7469 Other cirrhosis of liver: Secondary | ICD-10-CM | POA: Diagnosis not present

## 2017-02-24 ENCOUNTER — Other Ambulatory Visit: Payer: Self-pay | Admitting: Nurse Practitioner

## 2017-02-24 DIAGNOSIS — K7469 Other cirrhosis of liver: Secondary | ICD-10-CM

## 2017-02-24 DIAGNOSIS — F209 Schizophrenia, unspecified: Secondary | ICD-10-CM | POA: Diagnosis not present

## 2017-03-04 ENCOUNTER — Ambulatory Visit: Payer: Medicare Other | Admitting: Family Medicine

## 2017-03-06 ENCOUNTER — Encounter (HOSPITAL_COMMUNITY): Payer: Self-pay | Admitting: Emergency Medicine

## 2017-03-06 ENCOUNTER — Emergency Department (HOSPITAL_COMMUNITY)
Admission: EM | Admit: 2017-03-06 | Discharge: 2017-03-06 | Disposition: A | Discharge status: Home / Self Care | Payer: Medicare Other | Attending: Emergency Medicine | Admitting: Emergency Medicine

## 2017-03-06 DIAGNOSIS — F17210 Nicotine dependence, cigarettes, uncomplicated: Secondary | ICD-10-CM | POA: Diagnosis not present

## 2017-03-06 DIAGNOSIS — E7800 Pure hypercholesterolemia, unspecified: Secondary | ICD-10-CM | POA: Diagnosis not present

## 2017-03-06 DIAGNOSIS — Z79899 Other long term (current) drug therapy: Secondary | ICD-10-CM | POA: Insufficient documentation

## 2017-03-06 DIAGNOSIS — I10 Essential (primary) hypertension: Secondary | ICD-10-CM | POA: Diagnosis not present

## 2017-03-06 DIAGNOSIS — K0889 Other specified disorders of teeth and supporting structures: Secondary | ICD-10-CM | POA: Insufficient documentation

## 2017-03-06 MED ORDER — TRAMADOL HCL 50 MG PO TABS
50.0000 mg | ORAL_TABLET | Freq: Once | ORAL | Status: AC
  Administered 2017-03-06: 50 mg via ORAL
  Filled 2017-03-06: qty 1

## 2017-03-06 MED ORDER — CLINDAMYCIN HCL 150 MG PO CAPS: 300.0000 mg | ORAL_CAPSULE | Freq: Two times a day (BID) | ORAL | 0 refills | Status: DC

## 2017-03-06 MED ORDER — CLINDAMYCIN HCL 150 MG PO CAPS
300.0000 mg | ORAL_CAPSULE | Freq: Once | ORAL | Status: AC
  Administered 2017-03-06: 300 mg via ORAL
  Filled 2017-03-06: qty 2

## 2017-03-06 MED ORDER — TRAMADOL HCL 50 MG PO TABS: 50.0000 mg | ORAL_TABLET | Freq: Four times a day (QID) | ORAL | 0 refills | Status: DC | PRN

## 2017-03-06 NOTE — ED Notes (Signed)
Declined W/C at D/C and was escorted to lobby by RN. 

## 2017-03-06 NOTE — ED Provider Notes (Signed)
[facility] [facility] DEPT Provider Note   CSN: [id number] Arrival date & time: 03/06/17  1823   By signing my name below, I, Ny'Kea Lewis, attest that this documentation has been prepared under the direction and in the presence of Harlene Ramus, PA-C. Electronically Signed: Lise Auer, ED Scribe. 03/06/17. 7:06 PM.  History   Chief Complaint Chief Complaint  Patient presents with  . Dental Pain   HPI HPI Comments: [name] is a 66 y.o. male with no pertinent history,  who presents to the Emergency Department complaining of a moderate, gradually worsening area of pain and swelling to the front lower gums onset one week ago. He notes associated facial swelling, swelling to the local area and a subjective fever. Pt states pain is exacerbated with palpation and direct pressure. Denies trouble swallowing, SOB, vomiting, trismus, drooling, sore throat or drainage from the area.   Past Medical History:  Diagnosis Date  . BPH (benign prostatic hyperplasia)   . Colon polyp 2010  . Depression   . GERD (gastroesophageal reflux disease)   . Hepatitis C    "caught it when I had a blood transfusion"  . High cholesterol   . History of blood transfusion    "when I was young"  . Hypertension   . Paranoid schizophrenia ([place])   . Prostate atrophy   . Retinal vein occlusion     Patient Active Problem List   Diagnosis Date Noted  . CAP (community acquired pneumonia) 01/21/2017  . Chronic pain of left knee 11/24/2016  . Tobacco abuse   . Onychomycosis 01/17/2016  . Diastolic heart failure ([place]) 02/05/2015  . Hyperlipidemia 03/10/2014  . Patellar tendonitis 11/16/2012  . [place] OCCLUSION 06/03/2010  . PARANOID SCHIZOPHRENIA, CHRONIC 01/17/2009  . HYPERTENSION, BENIGN ESSENTIAL 01/17/2009  . BPH (benign prostatic hyperplasia) 01/17/2009  . HEPATITIS C 12/14/2008  . DEPRESSION 12/14/2008   Past Surgical History:  Procedure Laterality Date  . CIRCUMCISION  1959  .  TONSILLECTOMY      Home Medications    Prior to Admission medications   Medication Sig Start Date End Date Taking? Authorizing Provider  acetaminophen (TYLENOL) 500 MG tablet Take 1 tablet (500 mg total) by mouth every 6 (six) hours as needed. 01/17/17   Law, Bea Graff, PA-C  amLODipine (NORVASC) 10 MG tablet Take 1 tablet (10 mg total) by mouth daily. 01/21/17   [name], MD  aspirin 81 MG tablet Take 1 tablet (81 mg total) by mouth daily. 09/24/14   [name], MD  atorvastatin (LIPITOR) 40 MG tablet take 1 tablet by mouth once daily 02/11/17   [name], MD  azithromycin (ZITHROMAX) 250 MG tablet Take 1 tablet (250 mg total) by mouth daily. 01/17/17   Law, Bea Graff, PA-C  benztropine (COGENTIN) 1 MG tablet Take 1 tablet (1 mg total) by mouth 2 (two) times daily. 11/05/16   [name], MD  cetirizine (ZYRTEC) 10 MG tablet take 1 tablet by mouth once daily 02/20/15   [name], MD  clindamycin (CLEOCIN) 150 MG capsule Take 2 capsules (300 mg total) by mouth 2 (two) times daily. May dispense as 150mg  capsules 03/06/17   Nona Dell, PA-C  fluPHENAZine decanoate (PROLIXIN) 25 MG/ML injection Inject 25 mg into the muscle every 14 (fourteen) days. Last dose 09/07/12    [provider]  fluticasone (FLONASE) 50 MCG/ACT nasal spray Place 1 spray into both nostrils daily. 11/07/16   Shary Decamp, PA-C  hydrOXYzine (ATARAX/VISTARIL)  25 MG tablet Take 1 tablet (25 mg total) by mouth every 6 (six) hours. 12/15/16   Shary Decamp, PA-C  losartan (COZAAR) 100 MG tablet Take 1 tablet (100 mg total) by mouth daily. 01/21/17   [name], MD  Multiple Vitamin (MULTIVITAMIN WITH MINERALS) TABS tablet Take 1 tablet by mouth daily. 06/28/14   [name], MD  tamsulosin (FLOMAX) 0.4 MG CAPS capsule take 2 capsules by mouth once daily 09/28/16   Bacigalupo, Dionne Bucy, MD  traMADol (ULTRAM) 50 MG tablet Take 1 tablet (50 mg total) by mouth every  6 (six) hours as needed. 03/06/17   Nona Dell, PA-C  triamcinolone cream (KENALOG) 0.1 % Apply 1 application topically 2 (two) times daily. For 5 days, then twice daily as needed for rash and itching. 01/03/15   [name], MD  vitamin C (ASCORBIC ACID) 500 MG tablet Take 500 mg by mouth daily.    [provider]    Family History Family History  Problem Relation Age of Onset  . Hypertension Mother   . Diabetes Mother   . Stroke Mother     Social History Social History  Substance Use Topics  . Smoking status: Current Every Day Smoker    Packs/day: 0.50    Years: 48.00    Types: Cigarettes  . Smokeless tobacco: Never Used  . Alcohol use No     Allergies   Lisinopril and Penicillins   Review of Systems Review of Systems  Constitutional: Positive for fever. Negative for chills.  HENT: Positive for dental problem and facial swelling. Negative for trouble swallowing.   Skin: Positive for wound.   Physical Exam Updated Vital Signs BP (!) 163/92 (BP Location: Left Arm)   Pulse (!) 58   Temp 98.1 F (36.7 C) (Oral)   Resp 18   Ht 5\' 9"  (1.753 m)   Wt 84.4 kg (186 lb)   SpO2 100%   BMI 27.47 kg/m   Physical Exam  Constitutional: He is oriented to person, place, and time. He appears well-developed and well-nourished. No distress.  HENT:  Head: Normocephalic and atraumatic.  Mouth/Throat: Uvula is midline and oropharynx is clear and moist. He does not have dentures. No oral lesions. No trismus in the jaw. Abnormal dentition. Dental abscesses and dental caries present. No uvula swelling or lacerations. No oropharyngeal exudate.  Poor dentition with multipe decaying teeth and multiple dental caries throughout. Small area of swelling, induration, and erythema present to gingiva surround tooth number 27. No fluctuance or drainage.   No trismus, drooling, facial/neck swelling or stridor on exam. No muffled voice. Floor of mouth soft.    Eyes:  Conjunctivae and EOM are normal. Right eye exhibits no discharge. Left eye exhibits no discharge. No scleral icterus.  Neck: Normal range of motion. Neck supple.  Cardiovascular: Normal rate.   Pulmonary/Chest: Effort normal.  Lymphadenopathy:    He has no cervical adenopathy.  Neurological: He is alert and oriented to person, place, and time.  Skin: Skin is warm and dry. He is not diaphoretic.  Nursing note and vitals reviewed.  ED Treatments / Results  DIAGNOSTIC STUDIES: Oxygen Saturation is 100% on RA, normal by my interpretation.   COORDINATION OF CARE: 6:59 PM-Discussed next steps with pt. Pt verbalized understanding and is agreeable with the plan.   Labs (all labs ordered are listed, but only abnormal results are displayed) Labs Reviewed - No data to display  EKG  EKG Interpretation None  Radiology No results found.  Procedures Procedures (including critical care time)  Medications Ordered in ED Medications  clindamycin (CLEOCIN) capsule 300 mg (not administered)  traMADol (ULTRAM) tablet 50 mg (not administered)     Initial Impression / Assessment and Plan / ED Course  I have reviewed the triage vital signs and the nursing notes.  Pertinent labs & imaging results that were available during my care of the patient were reviewed by me and considered in my medical decision making (see chart for details).     Patient with toothache.  Exam showed poor dentition throughout with small area of swelling and induration present of gingiva surrounding tooth #27, presentation consistent with small/early abscess. Discussed tx option with pt including I&D, pt declined. Will start pt on abx and advised to f/u with dentist this week.  Exam unconcerning for Ludwig's angina or spread of infection. Discussed return precautions.   Final Clinical Impressions(s) / ED Diagnoses   Final diagnoses:  Pain, dental    New Prescriptions New Prescriptions   CLINDAMYCIN  (CLEOCIN) 150 MG CAPSULE    Take 2 capsules (300 mg total) by mouth 2 (two) times daily. May dispense as 150mg  capsules   TRAMADOL (ULTRAM) 50 MG TABLET    Take 1 tablet (50 mg total) by mouth every 6 (six) hours as needed.   I personally performed the services described in this documentation, which was scribed in my presence. The recorded information has been reviewed and is accurate.     Nona Dell, PA-C 03/06/17 Remonia Richter    Alfonzo Beers, MD 03/06/17 1931

## 2017-03-06 NOTE — ED Triage Notes (Signed)
Pt here for left front lower teeth pain and swelling x 1 week.

## 2017-03-06 NOTE — Discharge Instructions (Signed)
Take medications as prescribed. You may also take Tylenol as prescribed over the counter as needed for pain relief. You may apply ice to affected area for 15-20 minutes 3-4 times daily to help with pain. Follow-up with one of the dental clinics listed below for further management of your dental pain. Return to the emergency department if symptoms worsen or new onset of fever, headache, neck stiffness, facial/neck swelling, unable to open jaw, unable to swallow resulting in drooling, difficulty breathing, drainage, unable to tolerate fluids.

## 2017-03-08 ENCOUNTER — Other Ambulatory Visit: Payer: Self-pay | Admitting: Family Medicine

## 2017-03-09 NOTE — Telephone Encounter (Signed)
Have attempted to call patient in the past about the refill for hydrochlorothiazide. Left message with previous phone call on 02/17/2017 to call clinic to clarify what medications he is taking and have not heard back from patient.   I am unsure of whether he is still taking the hydrochlorothiazide since per chart review it seems his previous PCP recommended just he take amlodipine 10 mg daily and losartan 100 mg daily. Patient was discontinued from hydrochlorothiazide by Dr. Brita Romp on 01/21/2017.   Need to clarify before refilling prescription.  Thank you,  [name], DO

## 2017-03-10 DIAGNOSIS — F209 Schizophrenia, unspecified: Secondary | ICD-10-CM | POA: Diagnosis not present

## 2017-03-12 ENCOUNTER — Telehealth: Payer: Self-pay | Admitting: Family Medicine

## 2017-03-12 NOTE — Telephone Encounter (Signed)
Received fax from [facility] regarding EGD on 03/10/2017. Patient had EDG scheduled on 03/10/2017 which he did not show for the appointment.   Dalphine Handing, PGY-1 [facility] 03/12/2017 2:59 PM

## 2017-03-23 ENCOUNTER — Other Ambulatory Visit: Payer: Medicare Other

## 2017-03-24 ENCOUNTER — Ambulatory Visit: Payer: Medicare Other | Admitting: Family Medicine

## 2017-03-24 DIAGNOSIS — F209 Schizophrenia, unspecified: Secondary | ICD-10-CM | POA: Diagnosis not present

## 2017-03-25 ENCOUNTER — Encounter (HOSPITAL_COMMUNITY): Payer: Self-pay | Admitting: Emergency Medicine

## 2017-03-25 ENCOUNTER — Emergency Department (HOSPITAL_COMMUNITY): Payer: Medicare Other

## 2017-03-25 DIAGNOSIS — M7989 Other specified soft tissue disorders: Secondary | ICD-10-CM | POA: Diagnosis not present

## 2017-03-25 DIAGNOSIS — I10 Essential (primary) hypertension: Secondary | ICD-10-CM | POA: Insufficient documentation

## 2017-03-25 DIAGNOSIS — Z79899 Other long term (current) drug therapy: Secondary | ICD-10-CM | POA: Insufficient documentation

## 2017-03-25 DIAGNOSIS — Z7982 Long term (current) use of aspirin: Secondary | ICD-10-CM | POA: Diagnosis not present

## 2017-03-25 DIAGNOSIS — M25562 Pain in left knee: Secondary | ICD-10-CM | POA: Insufficient documentation

## 2017-03-25 DIAGNOSIS — G8929 Other chronic pain: Secondary | ICD-10-CM | POA: Insufficient documentation

## 2017-03-25 DIAGNOSIS — I110 Hypertensive heart disease with heart failure: Secondary | ICD-10-CM | POA: Diagnosis not present

## 2017-03-25 DIAGNOSIS — F17210 Nicotine dependence, cigarettes, uncomplicated: Secondary | ICD-10-CM | POA: Insufficient documentation

## 2017-03-25 DIAGNOSIS — I5030 Unspecified diastolic (congestive) heart failure: Secondary | ICD-10-CM | POA: Diagnosis not present

## 2017-03-25 NOTE — ED Triage Notes (Signed)
Pt c/o left knee swelling that started this am. Pt states initial injury to the knee was "back when I was in the [employer]". No injury to knee recently.

## 2017-03-26 ENCOUNTER — Emergency Department (HOSPITAL_COMMUNITY)
Admission: EM | Admit: 2017-03-26 | Discharge: 2017-03-26 | Disposition: A | Discharge status: Home / Self Care | Payer: Medicare Other | Attending: Emergency Medicine | Admitting: Emergency Medicine

## 2017-03-26 DIAGNOSIS — M25562 Pain in left knee: Secondary | ICD-10-CM | POA: Diagnosis not present

## 2017-03-26 DIAGNOSIS — G8929 Other chronic pain: Secondary | ICD-10-CM

## 2017-03-26 MED ORDER — DICLOFENAC SODIUM 1 % TD GEL
2.0000 g | Freq: Once | TRANSDERMAL | Status: AC
  Administered 2017-03-26: 01:00:00 2 g via TOPICAL
  Filled 2017-03-26: qty 100

## 2017-03-26 MED ORDER — DICLOFENAC SODIUM 1 % TD GEL: 2.0000 g | Freq: Three times a day (TID) | TRANSDERMAL | 0 refills | Status: DC | PRN

## 2017-03-26 NOTE — ED Provider Notes (Signed)
[place] DEPT Provider Note   CSN: [id number] Arrival date & time: 03/25/17  2146     History   Chief Complaint Chief Complaint  Patient presents with  . Joint Swelling    left knee    HPI [name] is a 66 y.o. male.  The history is provided by the patient and medical records.  Knee Pain   This is a chronic problem. The current episode started more than 1 week ago. The problem occurs constantly. The problem has not changed since onset.The pain is present in the left knee. The quality of the pain is described as aching and dull. The pain is moderate. Pertinent negatives include no numbness, full range of motion and no stiffness. The symptoms are aggravated by contact. He has tried nothing for the symptoms. The treatment provided no relief. There has been a history of trauma (years ago when in [employer]).    Past Medical History:  Diagnosis Date  . BPH (benign prostatic hyperplasia)   . Colon polyp 2010  . Depression   . GERD (gastroesophageal reflux disease)   . Hepatitis C    "caught it when I had a blood transfusion"  . High cholesterol   . History of blood transfusion    "when I was young"  . Hypertension   . Paranoid schizophrenia ([place])   . Prostate atrophy   . Retinal vein occlusion     Patient Active Problem List   Diagnosis Date Noted  . CAP (community acquired pneumonia) 01/21/2017  . Chronic pain of left knee 11/24/2016  . Tobacco abuse   . Onychomycosis 01/17/2016  . Diastolic heart failure ([place]) 02/05/2015  . Hyperlipidemia 03/10/2014  . Patellar tendonitis 11/16/2012  . [place] OCCLUSION 06/03/2010  . PARANOID SCHIZOPHRENIA, CHRONIC 01/17/2009  . HYPERTENSION, BENIGN ESSENTIAL 01/17/2009  . BPH (benign prostatic hyperplasia) 01/17/2009  . HEPATITIS C 12/14/2008  . DEPRESSION 12/14/2008    Past Surgical History:  Procedure Laterality Date  . CIRCUMCISION  1959  . TONSILLECTOMY         Home Medications    Prior to  Admission medications   Medication Sig Start Date End Date Taking? Authorizing Provider  acetaminophen (TYLENOL) 500 MG tablet Take 1 tablet (500 mg total) by mouth every 6 (six) hours as needed. 01/17/17   Law, Bea Graff, PA-C  amLODipine (NORVASC) 10 MG tablet Take 1 tablet (10 mg total) by mouth daily. 01/21/17   [name], MD  aspirin 81 MG tablet Take 1 tablet (81 mg total) by mouth daily. 09/24/14   [name], MD  atorvastatin (LIPITOR) 40 MG tablet take 1 tablet by mouth once daily 02/11/17   [name], MD  azithromycin (ZITHROMAX) 250 MG tablet Take 1 tablet (250 mg total) by mouth daily. 01/17/17   Law, Bea Graff, PA-C  benztropine (COGENTIN) 1 MG tablet Take 1 tablet (1 mg total) by mouth 2 (two) times daily. 11/05/16   [name], MD  cetirizine (ZYRTEC) 10 MG tablet take 1 tablet by mouth once daily 02/20/15   [name], MD  clindamycin (CLEOCIN) 150 MG capsule Take 2 capsules (300 mg total) by mouth 2 (two) times daily. May dispense as 150mg  capsules 03/06/17   Nona Dell, PA-C  fluPHENAZine decanoate (PROLIXIN) 25 MG/ML injection Inject 25 mg into the muscle every 14 (fourteen) days. Last dose 09/07/12    [provider]  fluticasone (FLONASE) 50 MCG/ACT nasal spray Place 1 spray into both  nostrils daily. 11/07/16   Shary Decamp, PA-C  hydrOXYzine (ATARAX/VISTARIL) 25 MG tablet Take 1 tablet (25 mg total) by mouth every 6 (six) hours. 12/15/16   Shary Decamp, PA-C  losartan (COZAAR) 100 MG tablet Take 1 tablet (100 mg total) by mouth daily. 01/21/17   [name], MD  Multiple Vitamin (MULTIVITAMIN WITH MINERALS) TABS tablet Take 1 tablet by mouth daily. 06/28/14   [name], MD  tamsulosin (FLOMAX) 0.4 MG CAPS capsule take 2 capsules by mouth once daily 09/28/16   Bacigalupo, Dionne Bucy, MD  traMADol (ULTRAM) 50 MG tablet Take 1 tablet (50 mg total) by mouth every 6 (six) hours as needed. 03/06/17   Nona Dell, PA-C  triamcinolone cream (KENALOG) 0.1 % Apply 1 application topically 2 (two) times daily. For 5 days, then twice daily as needed for rash and itching. 01/03/15   [name], MD  vitamin C (ASCORBIC ACID) 500 MG tablet Take 500 mg by mouth daily.    [provider]    Family History Family History  Problem Relation Age of Onset  . Hypertension Mother   . Diabetes Mother   . Stroke Mother     Social History Social History  Substance Use Topics  . Smoking status: Current Every Day Smoker    Packs/day: 0.50    Years: 48.00    Types: Cigarettes  . Smokeless tobacco: Never Used  . Alcohol use No     Allergies   Lisinopril and Penicillins   Review of Systems Review of Systems  Constitutional: Negative for chills, diaphoresis, fatigue and fever.  HENT: Negative for congestion and rhinorrhea.   Eyes: Negative for visual disturbance.  Respiratory: Negative for cough, chest tightness, shortness of breath, wheezing and stridor.   Cardiovascular: Negative for chest pain, palpitations and leg swelling.  Gastrointestinal: Negative for abdominal pain, constipation, diarrhea, nausea and vomiting.  Genitourinary: Negative for flank pain.  Musculoskeletal: Negative for back pain, joint swelling, neck pain, neck stiffness and stiffness.  Skin: Negative for rash and wound.  Neurological: Negative for light-headedness, numbness and headaches.  Psychiatric/Behavioral: Negative for agitation.  All other systems reviewed and are negative.    Physical Exam Updated Vital Signs BP (!) 169/96 (BP Location: Left Arm)   Pulse 67   Temp 98.5 F (36.9 C) (Oral)   Resp 18   Ht 5\' 9"  (1.753 m)   Wt 79.8 kg (176 lb)   SpO2 100%   BMI 25.99 kg/m   Physical Exam  Constitutional: He appears well-developed and well-nourished. No distress.  HENT:  Head: Normocephalic and atraumatic.  Mouth/Throat: Oropharynx is clear and moist.  Eyes: Conjunctivae are normal.    Neck: Neck supple.  Cardiovascular: Normal rate and regular rhythm.   No murmur heard. Pulmonary/Chest: Effort normal and breath sounds normal. No respiratory distress. He has no wheezes. He exhibits no tenderness.  Abdominal: Soft. There is no tenderness.  Musculoskeletal: He exhibits tenderness. He exhibits no edema or deformity.  Neurological: He is alert. No cranial nerve deficit or sensory deficit. He exhibits normal muscle tone. Coordination normal.  Skin: Skin is warm and dry. Capillary refill takes less than 2 seconds. He is not diaphoretic. No erythema.  Psychiatric: He has a normal mood and affect.  Nursing note and vitals reviewed.    ED Treatments / Results  Labs (all labs ordered are listed, but only abnormal results are displayed) Labs Reviewed - No data to display  EKG  EKG Interpretation  None       Radiology Dg Knee Complete 4 Views Left  Result Date: 03/25/2017 CLINICAL DATA:  Left knee swelling and pain. EXAM: LEFT KNEE - COMPLETE 4+ VIEW COMPARISON:  Prior left knee radiographs: 12/17/2016, 10/18/2016, 07/15/2016 and 11/14/2012 FINDINGS: Stable radiographs without evidence of fracture, dislocation, or joint effusion. No significant arthropathy. No bony lesions or destruction Soft tissues are unremarkable. IMPRESSION: Stable and unremarkable left knee x-rays. Electronically Signed   By: Aletta Edouard M.D.   On: 03/25/2017 22:34    Procedures Procedures (including critical care time)  Medications Ordered in ED Medications - No data to display   Initial Impression / Assessment and Plan / ED Course  I have reviewed the triage vital signs and the nursing notes.  Pertinent labs & imaging results that were available during my care of the patient were reviewed by me and considered in my medical decision making (see chart for details).     [name] is a 66 y.o. male with past medical history significant for hypertension, hyperlipidemia, CHF, hepatitis,  and schizophrenia who presents with chronic left knee pain. Patient reports that he has had knee pain for many years since he was in the [employer]. He says that previously he had knee injections but has not had one in 2 months. He says that the pain is the same as it used to be. He says it also had success with the creams in the past. He denies any new traumas. He denies fevers, chills, rash, or swelling. He denies any other locations of pain. He denies any leg swelling or pain in the calf or lower leg. He denies any numbness tingling or weakness of the leg. Next  On exam, patient has mild tenderness in the medial left knee. No swelling appreciated. Normal range of motion of the knee. No crepitance. Normal pulse in the distal left foot. Normal plantar and dorsiflexion. Normal reflexes. Normal hip range of motion and no tenderness aside from the area of the knee. Exam otherwise unremarkable.  X-ray's were obtained showing no evidence of fracture, effusion, or other abnormality. No dislocation of the knee.  Given patient's report that the cream has helped in the past, patient will be ordered a dose of Voltaren cream. Patient thinks that this is a cream that helped him.  Patient advised that he should not use too much of the cream or use it in conjunction with other nostril anti-inflammatory medications because of his previously elevated kidney function. Patient understands this.  Patient will be given prescription for the Voltaren gel and instructed to follow-up with his PCP as well as his physician who presented at his knees. Patient understands return precautions for any signs or symptoms of infection or other abnormality. Clinically, do not feel patient has infection today.    Patient has no other questions or concerns and was discharged in good condition.    Final Clinical Impressions(s) / ED Diagnoses   Final diagnoses:  Chronic pain of left knee    New Prescriptions Discharge Medication  List as of 03/26/2017 12:50 AM    START taking these medications   Details  diclofenac sodium (VOLTAREN) 1 % GEL Apply 2 g topically 3 (three) times daily as needed., Starting Fri 03/26/2017, Print       Clinical Impression: 1. Chronic pain of left knee     Disposition: Discharge  Condition: Good  I have discussed the results, Dx and Tx plan with the pt(& family if present). He/she/they  expressed understanding and agree(s) with the plan. Discharge instructions discussed at great length. Strict return precautions discussed and pt &/or family have verbalized understanding of the instructions. No further questions at time of discharge.    Discharge Medication List as of 03/26/2017 12:50 AM    START taking these medications   Details  diclofenac sodium (VOLTAREN) 1 % GEL Apply 2 g topically 3 (three) times daily as needed., Starting Fri 03/26/2017, Print        Follow Up: [facility] [address] [place] [address] [phone] Schedule an appointment as soon as possible for a visit    [facility] [address] [id number] [place] [phone]  If symptoms worsen     Jamier Urbas, Gwenyth Allegra, MD 03/26/17 2112

## 2017-03-26 NOTE — Discharge Instructions (Signed)
Please use the topical gel to help with your knee pain. Please avoid using other nonsteroidal anti-inflammatory medications while you're using the cream. Please do not use too much of the cream as too much of it may hurt your kidneys. Please stay hydrated. Please follow-up with your primary care physician and your knee physician for further management of your chronic knee pain. Today's exam did not show evidence of infection. Your images were reassuring with no fractures. If any symptoms change or worsen, please return to the nearest emergency department.

## 2017-04-01 ENCOUNTER — Ambulatory Visit (INDEPENDENT_AMBULATORY_CARE_PROVIDER_SITE_OTHER): Payer: Medicare Other | Admitting: Family Medicine

## 2017-04-01 ENCOUNTER — Encounter: Payer: Self-pay | Admitting: Family Medicine

## 2017-04-01 VITALS — BP 138/80 | HR 62 | Temp 98.4°F | Ht 69.0 in | Wt 181.0 lb

## 2017-04-01 DIAGNOSIS — J189 Pneumonia, unspecified organism: Secondary | ICD-10-CM

## 2017-04-01 DIAGNOSIS — J181 Lobar pneumonia, unspecified organism: Secondary | ICD-10-CM | POA: Diagnosis not present

## 2017-04-01 DIAGNOSIS — I200 Unstable angina: Secondary | ICD-10-CM

## 2017-04-01 NOTE — Progress Notes (Signed)
   Subjective:    Patient ID: [name], male    DOB: 1951-05-03, 66 y.o.   MRN: [id number]   CC: Pneumonia follow up  HPI: Patient is a 66 yo male who was recently treated for pneumonia at the beginning of June who presents today for follow up. Patient reports that he has been doing well with no breathing issue and wanted to make sure that the pneumonia did not returned. Patient denies any cough, fever, chills, difficulty breathing, shortness of breath or chest pain. Patient received pneumonia vaccine PCV 13 in January 2018.  Smoking status reviewed   ROS: all other systems were reviewed and are negative other than in the HPI   Past Medical History:  Diagnosis Date  . BPH (benign prostatic hyperplasia)   . Colon polyp 2010  . Depression   . GERD (gastroesophageal reflux disease)   . Hepatitis C    "caught it when I had a blood transfusion"  . High cholesterol   . History of blood transfusion    "when I was young"  . Hypertension   . Paranoid schizophrenia ([place])   . Prostate atrophy   . Retinal vein occlusion     Past Surgical History:  Procedure Laterality Date  . CIRCUMCISION  1959  . TONSILLECTOMY      Past medical history, surgical, family, and social history reviewed and updated in the EMR as appropriate.  Objective:  BP 138/80   Pulse 62   Temp 98.4 F (36.9 C) (Oral)   Ht 5\' 9"  (1.753 m)   Wt 181 lb (82.1 kg)   SpO2 98%   BMI 26.73 kg/m   Vitals and nursing note reviewed  General: NAD, pleasant, able to participate in exam Cardiac: RRR, normal heart sounds, no murmurs. 2+ radial and PT pulses bilaterally Respiratory: CTAB, normal effort, No wheezes, rales or rhonchi Abdomen: soft, nontender, nondistended, no hepatic or splenomegaly, +BS Extremities: no edema or cyanosis. WWP. Skin: warm and dry, no rashes noted Neuro: alert and oriented x4, no focal deficits Psych: Normal affect and mood   Assessment & Plan:   #Pneumonia follow up Patient  present today for pneumonia follow up. Patient denies any symptoms concerning for infection. Exam is unremarkable. Patient will received PSV23 in January. No current concern at the moment. Will schedule patient with annual wellness visit. --PSV23 in January 2019. --Scheduled for annual wellness visit.    Marjie Skiff, MD [place] PGY-2

## 2017-04-01 NOTE — Patient Instructions (Signed)
It was great seeing you today! We have addressed the following issues today  1. Yor lung exam is completely normal no signs or symptoms concerning for pneumonia today. 2. You are due for your second pneumonia vaccine in January 3. Please make sure you stop at the front desk to schedule you annual wellness visit with Lauren.  If we did any lab work today, and the results require attention, either me or my nurse will get in touch with you. If everything is normal, you will get a letter in mail and a message via . If you don't hear from [place] in two weeks, please give [place] a call. Otherwise, we look forward to seeing you again at your next visit. If you have any questions or concerns before then, please call the clinic at [phone].  Please bring all your medications to every doctors visit  Sign up for My Chart to have easy access to your labs results, and communication with your Primary care physician. Please ask Front Desk for some assistance.   Please check-out at the front desk before leaving the clinic.    Take Care,   Dr. Andy Gauss

## 2017-04-06 DIAGNOSIS — F209 Schizophrenia, unspecified: Secondary | ICD-10-CM | POA: Diagnosis not present

## 2017-04-07 ENCOUNTER — Ambulatory Visit (INDEPENDENT_AMBULATORY_CARE_PROVIDER_SITE_OTHER): Payer: Medicare Other | Admitting: Internal Medicine

## 2017-04-07 ENCOUNTER — Encounter: Payer: Self-pay | Admitting: Internal Medicine

## 2017-04-07 DIAGNOSIS — I10 Essential (primary) hypertension: Secondary | ICD-10-CM

## 2017-04-07 DIAGNOSIS — I200 Unstable angina: Secondary | ICD-10-CM | POA: Diagnosis present

## 2017-04-07 DIAGNOSIS — F209 Schizophrenia, unspecified: Secondary | ICD-10-CM | POA: Diagnosis not present

## 2017-04-07 NOTE — Progress Notes (Signed)
   Subjective:   Patient: [name]       Birthdate: 30-Jul-1951       MRN: [id number]      HPI  [name] is a 66 y.o. male presenting for BP concerns.   Patient was at his behavioral health appt two days ago when his BP was noted to be elevated at 174/110 at beginning of appt. It was checked later on in the appt and had improved to a normal BP. It was checked again this morning when he was at behavioral health receiving an injection. He thinks this morning it was 136/86. He has been taking Norvasc 10mg  and losartan 100mg  as prescribed. Denies any vision changes or HA. Just wanted to come in today to make sure he didn't need any medication changes.   Smoking status reviewed. Patient is current every day smoker.   Review of Systems See HPI.     Objective:  Physical Exam  Constitutional: He is oriented to person, place, and time and well-developed, well-nourished, and in no distress.  HENT:  Head: Normocephalic and atraumatic.  Eyes: Conjunctivae and EOM are normal. Right eye exhibits no discharge. Left eye exhibits no discharge.  Pulmonary/Chest: Effort normal. No respiratory distress.  Neurological: He is alert and oriented to person, place, and time.  Skin: Skin is warm and dry.  Psychiatric: Affect and judgment normal.      Assessment & Plan:  HYPERTENSION, BENIGN ESSENTIAL BP at goal today at 132/86. Also reportedly normal this morning at behavioral health appt, and after sitting for a while during initial appt when BP was elevated. No reported symptoms of elevated BP. As such, will continue current med regimen.  - Continue amlodipine and losartan   Adin Hector, MD, MPH PGY-3 [facility] Pager [phone]

## 2017-04-07 NOTE — Assessment & Plan Note (Signed)
BP at goal today at 132/86. Also reportedly normal this morning at behavioral health appt, and after sitting for a while during initial appt when BP was elevated. No reported symptoms of elevated BP. As such, will continue current med regimen.  - Continue amlodipine and losartan

## 2017-04-07 NOTE — Patient Instructions (Addendum)
It was nice meeting you today Mr. [name]!  Please continue taking your amlodipine and losartan as you have been.   If you have any questions or concerns, please feel free to call the clinic.   Be well,  Dr. Avon Gully

## 2017-04-09 ENCOUNTER — Ambulatory Visit: Payer: Medicare Other | Admitting: Family Medicine

## 2017-04-09 NOTE — Progress Notes (Deleted)
   Subjective:   Patient ID: [name]    DOB: [birth year], 66 y.o. male   MRN: [id number]  CC: "Cold"  HPI: [name] is a 66 y.o. male who presents to [facility] for a cold. Problems discussed today are as follows:  ***: *** ROS: ***  ***NEEDS PNA VACCINE  Complete ROS performed, see HPI for pertinent.  [place]: HFpEF, HTN, HLD< BPH, tobacco use disorder, paranoid schizophrenia, treated hep C, depression. Surgical tonsillectomy. Family history HTN, DM, stroke. Smoking status reviewed. Medications reviewed.  Objective:   There were no vitals taken for this visit. Vitals and nursing note reviewed.  General: well nourished, well developed, in no acute distress with non-toxic appearance HEENT: normocephalic, atraumatic, moist mucous membranes Neck: supple, non-tender without lymphadenopathy CV: regular rate and rhythm without murmurs, rubs, or gallops, no lower extremity edema Lungs: clear to auscultation bilaterally with normal work of breathing Abdomen: soft, non-tender, non-distended, no masses or organomegaly palpable, normoactive bowel sounds Skin: warm, dry, no rashes or lesions, cap refill < 2 seconds Extremities: warm and well perfused, normal tone  Assessment & Plan:   No problem-specific Assessment & Plan notes found for this encounter.  No orders of the defined types were placed in this encounter.  No orders of the defined types were placed in this encounter.   Harriet Butte, [place], PGY-2 04/09/2017 11:55 AM

## 2017-04-12 ENCOUNTER — Ambulatory Visit
Admission: RE | Admit: 2017-04-12 | Discharge: 2017-04-12 | Disposition: A | Discharge status: Home / Self Care | Payer: Medicare Other | Source: Ambulatory Visit | Attending: Nurse Practitioner | Admitting: Nurse Practitioner

## 2017-04-12 DIAGNOSIS — K7460 Unspecified cirrhosis of liver: Secondary | ICD-10-CM | POA: Diagnosis not present

## 2017-04-12 DIAGNOSIS — K7469 Other cirrhosis of liver: Secondary | ICD-10-CM

## 2017-04-13 DIAGNOSIS — H524 Presbyopia: Secondary | ICD-10-CM | POA: Diagnosis not present

## 2017-04-13 DIAGNOSIS — H31001 Unspecified chorioretinal scars, right eye: Secondary | ICD-10-CM | POA: Diagnosis not present

## 2017-04-26 DIAGNOSIS — J324 Chronic pansinusitis: Secondary | ICD-10-CM | POA: Diagnosis not present

## 2017-04-26 DIAGNOSIS — J3089 Other allergic rhinitis: Secondary | ICD-10-CM | POA: Diagnosis not present

## 2017-05-03 ENCOUNTER — Ambulatory Visit: Payer: Medicare Other | Admitting: Family Medicine

## 2017-05-03 NOTE — Progress Notes (Deleted)
   Subjective:    Patient ID: [name], male    DOB: 1950/08/31, 66 y.o.   MRN: [id number]   CC:  HPI:   Smoking status reviewed  Review of Systems   Objective:  There were no vitals taken for this visit. Vitals and nursing note reviewed  General: well nourished, in no acute distress HEENT: normocephalic, TM's visualized bilaterally, no scleral icterus or conjunctival pallor, no nasal discharge, moist mucous membranes, good dentition without erythema or discharge noted in posterior oropharynx Neck: supple, non-tender, without lymphadenopathy Cardiac: RRR, clear S1 and S2, no murmurs, rubs, or gallops Respiratory: clear to auscultation bilaterally, no increased work of breathing Abdomen: soft, nontender, nondistended, no masses or organomegaly. Bowel sounds present Extremities: no edema or cyanosis. Warm, well perfused. 2+ radial and PT pulses bilaterally Skin: warm and dry, no rashes noted Neuro: alert and oriented, no focal deficits   Assessment & Plan:    No problem-specific Assessment & Plan notes found for this encounter.    No Follow-up on file.   [name], DO, PGY-1

## 2017-05-05 DIAGNOSIS — F209 Schizophrenia, unspecified: Secondary | ICD-10-CM | POA: Diagnosis not present

## 2017-05-12 DIAGNOSIS — K2970 Gastritis, unspecified, without bleeding: Secondary | ICD-10-CM | POA: Diagnosis not present

## 2017-05-12 DIAGNOSIS — K759 Inflammatory liver disease, unspecified: Secondary | ICD-10-CM | POA: Diagnosis not present

## 2017-05-19 DIAGNOSIS — F209 Schizophrenia, unspecified: Secondary | ICD-10-CM | POA: Diagnosis not present

## 2017-05-20 DIAGNOSIS — J324 Chronic pansinusitis: Secondary | ICD-10-CM | POA: Diagnosis not present

## 2017-05-21 ENCOUNTER — Ambulatory Visit (INDEPENDENT_AMBULATORY_CARE_PROVIDER_SITE_OTHER): Payer: Medicare Other | Admitting: *Deleted

## 2017-05-21 DIAGNOSIS — Z23 Encounter for immunization: Secondary | ICD-10-CM | POA: Diagnosis present

## 2017-06-02 DIAGNOSIS — F209 Schizophrenia, unspecified: Secondary | ICD-10-CM | POA: Diagnosis not present

## 2017-06-09 ENCOUNTER — Encounter: Payer: Self-pay | Admitting: Family Medicine

## 2017-06-16 DIAGNOSIS — F209 Schizophrenia, unspecified: Secondary | ICD-10-CM | POA: Diagnosis not present

## 2017-06-30 DIAGNOSIS — F209 Schizophrenia, unspecified: Secondary | ICD-10-CM | POA: Diagnosis not present

## 2017-07-21 DIAGNOSIS — F209 Schizophrenia, unspecified: Secondary | ICD-10-CM | POA: Diagnosis not present

## 2017-08-04 DIAGNOSIS — F209 Schizophrenia, unspecified: Secondary | ICD-10-CM | POA: Diagnosis not present

## 2017-08-24 DIAGNOSIS — K7469 Other cirrhosis of liver: Secondary | ICD-10-CM | POA: Diagnosis not present

## 2017-08-25 DIAGNOSIS — F209 Schizophrenia, unspecified: Secondary | ICD-10-CM | POA: Diagnosis not present

## 2017-08-26 ENCOUNTER — Other Ambulatory Visit: Payer: Self-pay | Admitting: Nurse Practitioner

## 2017-08-26 DIAGNOSIS — K7469 Other cirrhosis of liver: Secondary | ICD-10-CM

## 2017-09-08 DIAGNOSIS — F209 Schizophrenia, unspecified: Secondary | ICD-10-CM | POA: Diagnosis not present

## 2017-09-19 NOTE — Progress Notes (Deleted)
   Subjective:    Patient ID: [name], male    DOB: 1951-06-10, 67 y.o.   MRN: [id number]   CC: HTN  HPI: Hypertension: - Medications: amlodipine 10 mg, losartan 100mg   - Compliance: *** - Checking BP at home: *** - Denies any SOB, CP, vision changes, LE edema, medication SEs, or symptoms of hypotension - Diet: *** - Exercise: ***   Smoking status reviewed  Review of Systems   Objective:  There were no vitals taken for this visit. Vitals and nursing note reviewed  General: well nourished, in no acute distress HEENT: normocephalic, TM's visualized bilaterally, no scleral icterus or conjunctival pallor, no nasal discharge, moist mucous membranes, good dentition without erythema or discharge noted in posterior oropharynx Neck: supple, non-tender, without lymphadenopathy Cardiac: RRR, clear S1 and S2, no murmurs, rubs, or gallops Respiratory: clear to auscultation bilaterally, no increased work of breathing Abdomen: soft, nontender, nondistended, no masses or organomegaly. Bowel sounds present Extremities: no edema or cyanosis. Warm, well perfused. 2+ radial and PT pulses bilaterally Skin: warm and dry, no rashes noted Neuro: alert and oriented, no focal deficits   Assessment & Plan:    No problem-specific Assessment & Plan notes found for this encounter.    No Follow-up on file.   [name], DO, PGY-1

## 2017-09-20 ENCOUNTER — Ambulatory Visit: Payer: Medicare Other | Admitting: Family Medicine

## 2017-09-22 DIAGNOSIS — F209 Schizophrenia, unspecified: Secondary | ICD-10-CM | POA: Diagnosis not present

## 2017-09-22 NOTE — Progress Notes (Signed)
Subjective:    Patient ID: [name] , male   DOB: 1951-04-23 , 67 y.o..   MRN: [id number]  HPI  [name] is here for  Chief Complaint  Patient presents with  . Dental Pain    x 2 months  . Knee Pain    left x  2 months    1. Left Knee pain: Patient notes that he has had intermittent left knee pain since 1972.  He notes that initially his knee pain began when he fell on a blacktop that had ice over it.  He notes that sees an orthopedic doctor (Dr. Theda Sers) at the [place].  The last time he saw him was 3 months ago.  He notes that he has had injections previously with Dr. Cherly Beach.  He is only using heat for his knee pain, no medications.  He notes that he was given tramadol in the past and this did help.  He has not been taking NSAIDs secondary to kidney dysfunction.  He has not had any falls, numbness, weakness.  He is able to ambulate without assist.  Denies any knee swelling.  2. Dental Pain: Patient has had pain on the right lower part of his gum for the last 2 days.  He has not had any drainage of the area.  He notes that the area she is tender to touch and sometimes hurts when food touches it.  He is not taking any new medications.  He is not taking any medications for pain.  Has had no fevers chills, nausea, vomiting, dysphasia, shortness of breath, facial swelling.  Review of Systems: Per HPI.   Past Medical History: Patient Active Problem List   Diagnosis Date Noted  . Gingival erythema 09/23/2017  . CAP (community acquired pneumonia) 01/21/2017  . Chronic pain of left knee 11/24/2016  . Tobacco abuse   . Onychomycosis 01/17/2016  . Diastolic heart failure ([place]) 02/05/2015  . Hyperlipidemia 03/10/2014  . Patellar tendonitis 11/16/2012  . [place] OCCLUSION 06/03/2010  . PARANOID SCHIZOPHRENIA, CHRONIC 01/17/2009  . HYPERTENSION, BENIGN ESSENTIAL 01/17/2009  . BPH (benign prostatic hyperplasia) 01/17/2009  . HEPATITIS C 12/14/2008  . DEPRESSION 12/14/2008     Medications: reviewed  Current Outpatient Medications  Medication Sig Dispense Refill  . acetaminophen (TYLENOL) 500 MG tablet Take 1 tablet (500 mg total) by mouth every 6 (six) hours as needed. 30 tablet 0  . amLODipine (NORVASC) 10 MG tablet Take 1 tablet (10 mg total) by mouth daily. 30 tablet 5  . aspirin 81 MG tablet Take 1 tablet (81 mg total) by mouth daily. 30 tablet 6  . atorvastatin (LIPITOR) 40 MG tablet take 1 tablet by mouth once daily 90 tablet 3  . azithromycin (ZITHROMAX) 250 MG tablet Take 1 tablet (250 mg total) by mouth daily. 4 tablet 0  . benztropine (COGENTIN) 1 MG tablet Take 1 tablet (1 mg total) by mouth 2 (two) times daily. 60 tablet 0  . cetirizine (ZYRTEC) 10 MG tablet take 1 tablet by mouth once daily 30 tablet 6  . clindamycin (CLEOCIN) 300 MG capsule Take 1 capsule (300 mg total) by mouth 3 (three) times daily for 10 days. 30 capsule 0  . diclofenac sodium (VOLTAREN) 1 % GEL Apply 2 g topically 3 (three) times daily as needed. 1 Tube 0  . fluPHENAZine decanoate (PROLIXIN) 25 MG/ML injection Inject 25 mg into the muscle every 14 (fourteen) days. Last dose 09/07/12    . fluticasone (FLONASE)  50 MCG/ACT nasal spray Place 1 spray into both nostrils daily. 16 g 0  . hydrOXYzine (ATARAX/VISTARIL) 25 MG tablet Take 1 tablet (25 mg total) by mouth every 6 (six) hours. 10 tablet 0  . losartan (COZAAR) 100 MG tablet Take 1 tablet (100 mg total) by mouth daily. 30 tablet 5  . Multiple Vitamin (MULTIVITAMIN WITH MINERALS) TABS tablet Take 1 tablet by mouth daily. 30 tablet 6  . tamsulosin (FLOMAX) 0.4 MG CAPS capsule take 2 capsules by mouth once daily 60 capsule 11  . traMADol (ULTRAM) 50 MG tablet Take 1 tablet (50 mg total) by mouth every 6 (six) hours as needed. 12 tablet 0  . triamcinolone cream (KENALOG) 0.1 % Apply 1 application topically 2 (two) times daily. For 5 days, then twice daily as needed for rash and itching. 30 g 0  . vitamin C (ASCORBIC ACID) 500 MG  tablet Take 500 mg by mouth daily.     No current facility-administered medications for this visit.     Social Hx:  reports that he has been smoking cigarettes.  He has a 24.00 pack-year smoking history. he has never used smokeless tobacco.   Objective:   BP 140/85 (BP Location: Left Arm, Patient Position: Sitting, Cuff Size: Normal)   Pulse 69   Temp 98.5 F (36.9 C) (Oral)   Ht 5\' 9"  (1.753 m)   Wt 183 lb 3.2 oz (83.1 kg)   SpO2 99%   BMI 27.05 kg/m  Physical Exam  Gen: NAD, alert, cooperative with exam, well-appearing HEENT:     Head: Normocephalic, atraumatic    Neck: No masses palpated. No goiter. No lymphadenopathy     Ears: External ears normal, no drainage.Tympanic membranes intact, normal light reflex bilaterally, no erythema or bulging    Eyes: PERRLA, EOMI, sclera white, normal conjunctiva    Nose: nasal turbinates moist, no nasal discharge    Throat: moist mucus membranes, no pharyngeal erythema, no tonsillar exudate. Airway is patent    Mouth: Poor dentition.  False teeth on bottom.  Erythema and swelling in the right lower mandibular gum area, no appreciable fluctuance or drainage Cardiac: Regular rate and rhythm Respiratory: Clear to auscultation bilaterally, no wheezes, non-labored breathing Psych: good insight, normal mood and affect Left Knee: No obvious effusion.   Tender to palpation over the medial and lateral joint lines.  No warmth. ROM full in flexion and extension and lower leg rotation. Ligaments with solid consistent endpoints including ACL, PCL, LCL, MCL. Non painful patellar compression. Patellar glide without crepitus. Patellar and quadriceps tendons unremarkable. Hamstring and quadriceps strength is normal. Neurovascularly intact Normal gait   Assessment & Plan:  Gingival erythema Gingival erythema and right lower mandibular gum where the molars would be, he is missing several teeth and does have dentures.  The teeth that he does have  exhibited very poor dentition.  He could have an early abscess versus gingivitis.  Will treat with clindamycin for the next 10 days and advised follow-up with his dentist within the next couple days.  Red flag symptoms discussed and return precautions discussed.  Chronic pain of left knee Acute on chronic left knee pain.  No red flag symptoms today.  Exam most consistent with osteoarthritis.  No signs of septic joint or ligamentous injury.  Has had knee x-rays in the past which did not comment on osteoarthritis however these were not standing films.  He is following with orthopedics at the [place].  Is only using heat and no  pain medications.  NSAIDs were avoided secondary to CKD in the past.  Will try a short course of tramadol and then Tylenol as needed.  Discussed steroid injection of the knee however he declined and stated he would have this done at his orthopedic office.   Meds ordered this encounter  Medications  . traMADol (ULTRAM) 50 MG tablet    Sig: Take 1 tablet (50 mg total) by mouth every 6 (six) hours as needed.    Dispense:  12 tablet    Refill:  0  . clindamycin (CLEOCIN) 300 MG capsule    Sig: Take 1 capsule (300 mg total) by mouth 3 (three) times daily for 10 days.    Dispense:  30 capsule    Refill:  0    Smitty Cords, MD [place], PGY-3

## 2017-09-23 ENCOUNTER — Other Ambulatory Visit: Payer: Self-pay

## 2017-09-23 ENCOUNTER — Ambulatory Visit (INDEPENDENT_AMBULATORY_CARE_PROVIDER_SITE_OTHER): Payer: Medicare Other | Admitting: Family Medicine

## 2017-09-23 ENCOUNTER — Encounter: Payer: Self-pay | Admitting: Family Medicine

## 2017-09-23 DIAGNOSIS — M25562 Pain in left knee: Secondary | ICD-10-CM | POA: Diagnosis not present

## 2017-09-23 DIAGNOSIS — K068 Other specified disorders of gingiva and edentulous alveolar ridge: Secondary | ICD-10-CM

## 2017-09-23 DIAGNOSIS — G8929 Other chronic pain: Secondary | ICD-10-CM | POA: Diagnosis not present

## 2017-09-23 MED ORDER — CLINDAMYCIN HCL 300 MG PO CAPS: 300.0000 mg | ORAL_CAPSULE | Freq: Three times a day (TID) | ORAL | 0 refills | Status: AC

## 2017-09-23 MED ORDER — TRAMADOL HCL 50 MG PO TABS: 50.0000 mg | ORAL_TABLET | Freq: Four times a day (QID) | ORAL | 0 refills | Status: DC | PRN

## 2017-09-23 NOTE — Patient Instructions (Signed)
Thank you for coming in today, it was so nice to see you! Today we talked about:    Gum infection: Take the clindamycin antibiotic three times a day. Schedule an appt with your dentist ASAP  Knee pain: We are trying a short course of Tramadol. Continue to follow with your orthopedic doctor   Please follow up for your annual physical with Dr. Tammi Klippel. Dennis Bast can schedule this appointment at the front desk before you leave or call the clinic.  Bring in all your medications or supplements to each appointment for review.    If you have any questions or concerns, please do not hesitate to call the office at [phone]. You can also message me directly via MyChart.   Sincerely,  Smitty Cords, MD

## 2017-09-23 NOTE — Assessment & Plan Note (Signed)
Gingival erythema and right lower mandibular gum where the molars would be, he is missing several teeth and does have dentures.  The teeth that he does have exhibited very poor dentition.  He could have an early abscess versus gingivitis.  Will treat with clindamycin for the next 10 days and advised follow-up with his dentist within the next couple days.  Red flag symptoms discussed and return precautions discussed.

## 2017-09-23 NOTE — Assessment & Plan Note (Addendum)
Acute on chronic left knee pain.  No red flag symptoms today.  Exam most consistent with osteoarthritis.  No signs of septic joint or ligamentous injury.  Has had knee x-rays in the past which did not comment on osteoarthritis however these were not standing films.  He is following with orthopedics at the [place].  Is only using heat and no pain medications.  NSAIDs were avoided secondary to CKD in the past.  Will try a short course of tramadol and then Tylenol as needed.  Discussed steroid injection of the knee however he declined and stated he would have this done at his orthopedic office.

## 2017-10-06 DIAGNOSIS — F209 Schizophrenia, unspecified: Secondary | ICD-10-CM | POA: Diagnosis not present

## 2017-10-10 NOTE — Progress Notes (Deleted)
Subjective:  CC -- Annual Physical; With complaints of ***  Pt reports he ***   [facility]: Medication Compliance: {YES/NO/WILD AOZHY:86578}  Cardiac Risk as of ***(date): *** (assessment every 3-5 years)*** Aspirin: {YES/NO/WILD CARDS:18581} (68yr CAD risk >3%)*** Dx Hypertension: yes  Dx Hyperlipidemia: yes  Diabetes: no (screen until 66yo w/ BMI >25 or HTN/HLD)***(A1c >6.5, or classic Sxs w/ random CBG >200, or FBG >126 (fasting >8hr))*** Dx Obesity: {YES/NO/WILD CARDS:18581} (Class I BMI <34.9, Class II <39.9, Class III < 49.9)*** Weight Loss: {YES/NO/WILD CARDS:18581} (loss >10% needs further assessment of undernutrition/medical-cause/pharm-cause/food security)*** Physical Activity: {YES/NO/WILD IONGE:95284}  Urinary Incontinence/Retention: {YES/NO/WILD XLKGM:01027} (yes=med review, GU exam, blood/urine labs)***  Social: Driving: {YES/NO/WILD OZDGU:44034} (vision, mobility, cognition >> refer for driving assessment PRN)*** Alcohol Use: {YES/NO/WILD VQQVZ:56387}  Tobacco Use: {YES/NO/WILD FIEPP:29518}   - Interested in Quitting: {YES/NO/WILD ACZYS:06301}  Other Drugs: {YES/NO/WILD CARDS:18581}  Independence: *** Depression: {YES/NO/WILD CARDS:18581}   - PHQ9 score:  Support and Life at Home: {YES/NO/WILD CARDS:18581} (signs of neglect?)*** Advanced Directives: {YES/NO/WILD SWFUX:32355}   Cancer: (Factor life expectancy & benefits vs harms)*** Colorectal >> Colonoscopy: yes, 09/26/14  Lung >> Tobacco Use: {YES/NO/WILD CARDS:18581} (screen until 66yo w/ >45yr/pack/yr Hx, unless quit >60yr ago) ***  - If so, previous Low-Dose CT screen: {YES/NO/WILD DDUKG:25427}  Prostate >> Interested in DRE and/or PSA: {YES/NO/WILD CARDS:18581} (to be discussed w/ patient until age 46)*** Skin >> Suspicious lesions: {YES/NO/WILD CWCBJ:62831}   Other: Osteoporosis: {YES/NO/WILD CARDS:18581} (men w/ clinical manifestations of low bone mass)*** AAA Screen: {YES/NO/WILD CARDS:18581}  (screen men 65-75yo w/ fam Hx or any smoking Hx; once)*** Zoster Vaccine: {YES/NO/WILD CARDS:18581} (those >50yo, once)*** Pneumonia Vaccine: {YES/NO/WILD CARDS:18581} (PPSV13 then PPSV23 in 6-12mths; repeat PPSV23 once if pt received prior to 65yo and 86yrs have passed)*** Flu Vaccine: {YES/NO/WILD DVVOH:60737}    ROS-  Past Medical History Patient Active Problem List   Diagnosis Date Noted  . Gingival erythema 09/23/2017  . CAP (community acquired pneumonia) 01/21/2017  . Chronic pain of left knee 11/24/2016  . Tobacco abuse   . Onychomycosis 01/17/2016  . Diastolic heart failure ([place]) 02/05/2015  . Hyperlipidemia 03/10/2014  . Patellar tendonitis 11/16/2012  . [place] OCCLUSION 06/03/2010  . PARANOID SCHIZOPHRENIA, CHRONIC 01/17/2009  . HYPERTENSION, BENIGN ESSENTIAL 01/17/2009  . BPH (benign prostatic hyperplasia) 01/17/2009  . HEPATITIS C 12/14/2008  . DEPRESSION 12/14/2008    Medications- reviewed and updated Current Outpatient Medications  Medication Sig Dispense Refill  . acetaminophen (TYLENOL) 500 MG tablet Take 1 tablet (500 mg total) by mouth every 6 (six) hours as needed. 30 tablet 0  . amLODipine (NORVASC) 10 MG tablet Take 1 tablet (10 mg total) by mouth daily. 30 tablet 5  . aspirin 81 MG tablet Take 1 tablet (81 mg total) by mouth daily. 30 tablet 6  . atorvastatin (LIPITOR) 40 MG tablet take 1 tablet by mouth once daily 90 tablet 3  . azithromycin (ZITHROMAX) 250 MG tablet Take 1 tablet (250 mg total) by mouth daily. 4 tablet 0  . benztropine (COGENTIN) 1 MG tablet Take 1 tablet (1 mg total) by mouth 2 (two) times daily. 60 tablet 0  . cetirizine (ZYRTEC) 10 MG tablet take 1 tablet by mouth once daily 30 tablet 6  . diclofenac sodium (VOLTAREN) 1 % GEL Apply 2 g topically 3 (three) times daily as needed. 1 Tube 0  . fluPHENAZine decanoate (PROLIXIN) 25 MG/ML injection Inject 25 mg into the muscle every 14 (fourteen) days. Last dose 09/07/12    .  fluticasone (FLONASE) 50 MCG/ACT nasal spray Place 1 spray into both nostrils daily. 16 g 0  . hydrOXYzine (ATARAX/VISTARIL) 25 MG tablet Take 1 tablet (25 mg total) by mouth every 6 (six) hours. 10 tablet 0  . losartan (COZAAR) 100 MG tablet Take 1 tablet (100 mg total) by mouth daily. 30 tablet 5  . Multiple Vitamin (MULTIVITAMIN WITH MINERALS) TABS tablet Take 1 tablet by mouth daily. 30 tablet 6  . tamsulosin (FLOMAX) 0.4 MG CAPS capsule take 2 capsules by mouth once daily 60 capsule 11  . traMADol (ULTRAM) 50 MG tablet Take 1 tablet (50 mg total) by mouth every 6 (six) hours as needed. 12 tablet 0  . triamcinolone cream (KENALOG) 0.1 % Apply 1 application topically 2 (two) times daily. For 5 days, then twice daily as needed for rash and itching. 30 g 0  . vitamin C (ASCORBIC ACID) 500 MG tablet Take 500 mg by mouth daily.     No current facility-administered medications for this visit.     Objective: There were no vitals taken for this visit. Gen: NAD, alert, cooperative with exam*** HEENT: NCAT, EOMI, PERRL CV: RRR, good S1/S2, no murmur Resp: CTABL, no wheezes, non-labored Abd: Soft, Non Tender, Non Distended, BS present, no guarding or organomegaly Genital Exam: {male genital:311510::"not done"} Ext: No edema, warm Neuro: Alert and oriented, No gross deficits   Assessment/Plan:  No problem-specific Assessment & Plan notes found for this encounter.   No orders of the defined types were placed in this encounter.   No orders of the defined types were placed in this encounter.    [name], DO, PGY-1 10/10/2017 3:03 PM

## 2017-10-12 ENCOUNTER — Encounter: Payer: Medicare Other | Admitting: Family Medicine

## 2017-10-15 ENCOUNTER — Other Ambulatory Visit: Payer: Self-pay | Admitting: Family Medicine

## 2017-10-15 ENCOUNTER — Other Ambulatory Visit: Payer: Self-pay

## 2017-10-15 ENCOUNTER — Telehealth: Payer: Self-pay | Admitting: Family Medicine

## 2017-10-15 DIAGNOSIS — N400 Enlarged prostate without lower urinary tract symptoms: Secondary | ICD-10-CM

## 2017-10-15 NOTE — Telephone Encounter (Signed)
Tamulisin needs to be refilled asap to [facility], corner of [address].

## 2017-10-17 NOTE — Progress Notes (Signed)
Subjective:  CC -- Annual Physical; With complaints of pain under left rib  Pt reports he recently hurt his back 1 week ago. Patient states he jumped into a dumpster and heard a crack. Every since his back has hurt. He was very clear he did not want narcotics as he is a recovering from a drug addiction (cocaine and heroin). Patient states tylenol helps alleviate pain. Cannot use NSAIDs given h/o CKD. Pain increased when sitting down or pulling up to stand. States pain is an ache that does not radiate. Denies CP or SOB. No increased pain with deep breaths. Patient has not tried ice or heat.   General Healthcare: Medication Compliance: yes  Cardiac unable to calculate ASCVD risk as total cholesterol too low  Aspirin: yes   Dx Hypertension: yes  Dx Hyperlipidemia: yes  Diabetes: no Dx Obesity: no   Weight Loss: no   Physical Activity: yes  Urinary Incontinence/Retention: no   Social: Driving: no, too many speeding tickets but will be able to drive soon  Alcohol Use: no  Tobacco Use: yes              - Interested in Quitting: yes, but not now    Other Drugs: no. Quit using cocaine 13 years ago. Quit heroin since early 1970s.  Independence: yes, lives with wife, daughter and grandson  Depression: no  Support and Life at Home: yes   Advanced Directives: no, but would like help filling it out   Cancer:   Colorectal >> Colonoscopy: yes, 09/26/14  Lung >> Tobacco Use: yes               - If so, previous Low-Dose CT screen: no  Skin >> Suspicious lesions: no   Other: Osteoporosis: no  Zoster Vaccine: no  Pneumonia Vaccine: no  Flu Vaccine: 05/2017   Tobacco use: Patient reports daily 1/2 ppd smoking history since he was a teenager. States he quit for 2 years but then started back. Patient states smoking "makes you stronger". Has desire to learn of resources to quit smoking.   Past Medical History     Patient Active Problem List   Diagnosis Date Noted  . Gingival erythema  09/23/2017  . CAP (community acquired pneumonia) 01/21/2017  . Chronic pain of left knee 11/24/2016  . Tobacco abuse   . Onychomycosis 01/17/2016  . Diastolic heart failure ([place]) 02/05/2015  . Hyperlipidemia 03/10/2014  . Patellar tendonitis 11/16/2012  . [place] OCCLUSION 06/03/2010  . PARANOID SCHIZOPHRENIA, CHRONIC 01/17/2009  . HYPERTENSION, BENIGN ESSENTIAL 01/17/2009  . BPH (benign prostatic hyperplasia) 01/17/2009  . HEPATITIS C 12/14/2008  . DEPRESSION 12/14/2008    Medications- reviewed and updated       Current Outpatient Medications  Medication Sig Dispense Refill  . acetaminophen (TYLENOL) 500 MG tablet Take 1 tablet (500 mg total) by mouth every 6 (six) hours as needed. 30 tablet 0  . amLODipine (NORVASC) 10 MG tablet Take 1 tablet (10 mg total) by mouth daily. 30 tablet 5  . aspirin 81 MG tablet Take 1 tablet (81 mg total) by mouth daily. 30 tablet 6  . atorvastatin (LIPITOR) 40 MG tablet take 1 tablet by mouth once daily 90 tablet 3  . azithromycin (ZITHROMAX) 250 MG tablet Take 1 tablet (250 mg total) by mouth daily. 4 tablet 0  . benztropine (COGENTIN) 1 MG tablet Take 1 tablet (1 mg total) by mouth 2 (two) times daily. 60 tablet 0  . cetirizine (ZYRTEC)  10 MG tablet take 1 tablet by mouth once daily 30 tablet 6  . diclofenac sodium (VOLTAREN) 1 % GEL Apply 2 g topically 3 (three) times daily as needed. 1 Tube 0  . fluPHENAZine decanoate (PROLIXIN) 25 MG/ML injection Inject 25 mg into the muscle every 14 (fourteen) days. Last dose 09/07/12    . fluticasone (FLONASE) 50 MCG/ACT nasal spray Place 1 spray into both nostrils daily. 16 g 0  . hydrOXYzine (ATARAX/VISTARIL) 25 MG tablet Take 1 tablet (25 mg total) by mouth every 6 (six) hours. 10 tablet 0  . losartan (COZAAR) 100 MG tablet Take 1 tablet (100 mg total) by mouth daily. 30 tablet 5  . Multiple Vitamin (MULTIVITAMIN WITH MINERALS) TABS tablet Take 1 tablet by mouth daily. 30 tablet 6  .  tamsulosin (FLOMAX) 0.4 MG CAPS capsule take 2 capsules by mouth once daily 60 capsule 11  . traMADol (ULTRAM) 50 MG tablet Take 1 tablet (50 mg total) by mouth every 6 (six) hours as needed. 12 tablet 0  . triamcinolone cream (KENALOG) 0.1 % Apply 1 application topically 2 (two) times daily. For 5 days, then twice daily as needed for rash and itching. 30 g 0  . vitamin C (ASCORBIC ACID) 500 MG tablet Take 500 mg by mouth daily.     No current facility-administered medications for this visit.     Objective: Vitals:   10/18/17 1541  BP: 138/80  Pulse: 72  Temp: 97.6 F (36.4 C)  SpO2: 97%   Gen: NAD, alert, cooperative with exam  HEENT: NCAT, EOMI, PERRL CV: RRR, good S1/S2, no murmur Resp: CTABL, no wheezes, non-labored Abd: Soft, Non Tender, Non Distended, BS present, no guarding or organomegaly Genital Exam: not done Ext: No edema, warm, normal ROM, 5/5 muscle strength MSK: able to bend and flex back, able to turn without difficulty. No tenderness to palpation of ribs Neuro: Alert and oriented, No gross deficits   Assessment/Plan:  Low back sprain, initial encounter No deficit in ROM or strength. No neurological deficits. Non tender to palpation of ribs. Unlikely fracture given no pain with movement. Likely sprain. Patient states pain is alleviated with tylenol.  -advised tylenol prn  -advised heating pad prn  -follow up in 2 weeks if no improvement  -no imaging needed at this time   Tobacco abuse Counseled on health benefits of smoking cessation. Resources given on 1-800-QUIT-NOW hotline and informed that he is welcome to come to clinic for assistance in cessation when ready.  -will order low dose CT given long term smoking history   Healthcare maintenance Patient reports he would like assistance with Advanced Directive. Neoma Laming was not in office so paperwork was given to patient and patient informed that he should discuss this with his family and follow up with  Neoma Laming.   Return if symptoms worsen or fail to improve, for Please schedule follow up for HTN management.   [name], DO, PGY-1 10/10/2017 3:03 PM

## 2017-10-18 ENCOUNTER — Ambulatory Visit (INDEPENDENT_AMBULATORY_CARE_PROVIDER_SITE_OTHER): Payer: Medicare Other | Admitting: Family Medicine

## 2017-10-18 ENCOUNTER — Encounter: Payer: Self-pay | Admitting: Family Medicine

## 2017-10-18 ENCOUNTER — Telehealth: Payer: Self-pay

## 2017-10-18 VITALS — BP 138/80 | HR 72 | Temp 97.6°F | Wt 183.0 lb

## 2017-10-18 DIAGNOSIS — Z0000 Encounter for general adult medical examination without abnormal findings: Secondary | ICD-10-CM

## 2017-10-18 DIAGNOSIS — I10 Essential (primary) hypertension: Secondary | ICD-10-CM

## 2017-10-18 DIAGNOSIS — Z87898 Personal history of other specified conditions: Secondary | ICD-10-CM

## 2017-10-18 DIAGNOSIS — L84 Corns and callosities: Secondary | ICD-10-CM | POA: Diagnosis not present

## 2017-10-18 DIAGNOSIS — Z720 Tobacco use: Secondary | ICD-10-CM

## 2017-10-18 DIAGNOSIS — Z87891 Personal history of nicotine dependence: Secondary | ICD-10-CM | POA: Diagnosis not present

## 2017-10-18 DIAGNOSIS — F1911 Other psychoactive substance abuse, in remission: Secondary | ICD-10-CM

## 2017-10-18 DIAGNOSIS — S335XXA Sprain of ligaments of lumbar spine, initial encounter: Secondary | ICD-10-CM | POA: Diagnosis not present

## 2017-10-18 DIAGNOSIS — Z122 Encounter for screening for malignant neoplasm of respiratory organs: Secondary | ICD-10-CM | POA: Diagnosis not present

## 2017-10-18 NOTE — Telephone Encounter (Signed)
Called patient to inform him of his appointment. Left message with mother to call back.  If patient calls back please inform him of his CT appointment on Monday 10/25/2017 at 1630 with a show time of 1615 at [facility].[name], CMA

## 2017-10-18 NOTE — Patient Instructions (Addendum)
Smoking Tobacco Information Smoking tobacco will very likely harm your health. Tobacco contains a poisonous (toxic), colorless chemical called nicotine. Nicotine affects the brain and makes tobacco addictive. This change in your brain can make it hard to stop smoking. Tobacco also has other toxic chemicals that can hurt your body and raise your risk of many cancers. How can smoking tobacco affect me? Smoking tobacco can increase your chances of having serious health conditions, such as:  Cancer. Smoking is most commonly associated with lung cancer, but can lead to cancer in other parts of the body.  Chronic obstructive pulmonary disease (COPD). This is a long-term lung condition that makes it hard to breathe. It also gets worse over time.  High blood pressure (hypertension), heart disease, stroke, or heart attack.  Lung infections, such as pneumonia.  Cataracts. This is when the lenses in the eyes become clouded.  Digestive problems. This may include peptic ulcers, heartburn, and gastroesophageal reflux disease (GERD).  Oral health problems, such as gum disease and tooth loss.  Loss of taste and smell.  Smoking can affect your appearance by causing:  Wrinkles.  Yellow or stained teeth, fingers, and fingernails.  Smoking tobacco can also affect your social life.  Many workplaces, restaurants, hotels, and public places are tobacco-free. This means that you may experience challenges in finding places to smoke when away from home.  The cost of a smoking habit can be expensive. Expenses for someone who smokes come in two ways: ? You spend money on a regular basis to buy tobacco. ? Your health care costs in the long-term are higher if you smoke.  Tobacco smoke can also affect the health of those around you. Children of smokers have greater chances of: ? Sudden infant death syndrome (SIDS). ? Ear infections. ? Lung infections.  What lifestyle changes can be made?  Do not start  smoking. Quit if you already do.  To quit smoking: ? Make a plan to quit smoking and commit yourself to it. Look for programs to help you and ask your health care provider for recommendations and ideas. ? Talk with your health care provider about using nicotine replacement medicines to help you quit. Medicine replacement medicines include gum, lozenges, patches, sprays, or pills. ? Do not replace cigarette smoking with electronic cigarettes, which are commonly called e-cigarettes. The safety of e-cigarettes is not known, and some may contain harmful chemicals. ? Avoid places, people, or situations that tempt you to smoke. ? If you try to quit but return to smoking, don't give up hope. It is very common for people to try a number of times before they fully succeed. When you feel ready again, give it another try.  Quitting smoking might affect the way you eat as well as your weight. Be prepared to monitor your eating habits. Get support in planning and following a healthy diet.  Ask your health care provider about having regular tests (screenings) to check for cancer. This may include blood tests, imaging tests, and other tests.  Exercise regularly. Consider taking walks, joining a gym, or doing yoga or exercise classes.  Develop skills to manage your stress. These skills include meditation. What are the benefits of quitting smoking? By quitting smoking, you may:  Lower your risk of getting cancer and other diseases caused by smoking.  Live longer.  Breathe better.  Lower your blood pressure and heart rate.  Stop your addiction to tobacco.  Stop creating secondhand smoke that hurts other people.  Improve your   sense of taste and smell.  Look better over time, due to having fewer wrinkles and less staining.  What can happen if changes are not made? If you do not stop smoking, you may:  Get cancer and other diseases.  Develop COPD or other long-term (chronic) lung  conditions.  Develop serious problems with your heart and blood vessels (cardiovascular system).  Need more tests to screen for problems caused by smoking.  Have higher, long-term healthcare costs from medicines or treatments related to smoking.  Continue to have worsening changes in your lungs, mouth, and nose.  Where to find support: To get support to quit smoking, consider:  Asking your health care provider for more information and resources.  Taking classes to learn more about quitting smoking.  Looking for local organizations that offer resources about quitting smoking.  Joining a support group for people who want to quit smoking in your local community.  Where to find more information: You may find more information about quitting smoking from:  HelpGuide.org: www.helpguide.org/articles/addictions/how-to-quit-smoking.htm  https://hall.com/: smokefree.gov  American Lung Association: www.lung.org  Contact a health care provider if:  You have problems breathing.  Your lips, nose, or fingers turn blue.  You have chest pain.  You are coughing up blood.  You feel faint or you pass out.  You have other noticeable changes that cause you to worry. Summary  Smoking tobacco can negatively affect your health, the health of those around you, your finances, and your social life.  Do not start smoking. Quit if you already do. If you need help quitting, ask your health care provider.  Think about joining a support group for people who want to quit smoking in your local community. There are many effective programs that will help you to quit this behavior. This information is not intended to replace advice given to you by your health care provider. Make sure you discuss any questions you have with your health care provider. Document Released: 08/18/2016 Document Revised: 08/18/2016 Document Reviewed: 08/18/2016 Elsevier Interactive Patient Education  [employer].   It was  a pleasure seeing you today.   Today we discussed your physical   For your tobacco use: I recommend quitting. You can call 1-800-QUIT-NOW for help with resources. Please follow up with me if you have an interest in quitting.   I have ordered a low dose CT scan to check for lung cancer given you smoking history.   For your rib pain continued tylenol as needed and a heating pad as needed  Please follow up for your HTN managment or sooner if symptoms persist or worsen. Please call the clinic immediately if you have any concerns.   Our clinic's number is [phone]. Please call with questions or concerns.   Thank you,  [name], DO

## 2017-10-21 ENCOUNTER — Other Ambulatory Visit: Payer: Medicare Other

## 2017-10-21 DIAGNOSIS — S335XXA Sprain of ligaments of lumbar spine, initial encounter: Secondary | ICD-10-CM | POA: Insufficient documentation

## 2017-10-21 NOTE — Assessment & Plan Note (Signed)
Patient reports he would like assistance with Advanced Directive. Neoma Laming was not in office so paperwork was given to patient and patient informed that he should discuss this with his family and follow up with Neoma Laming.

## 2017-10-21 NOTE — Assessment & Plan Note (Signed)
Counseled on health benefits of smoking cessation. Resources given on 1-800-QUIT-NOW hotline and informed that he is welcome to come to clinic for assistance in cessation when ready.  -will order low dose CT given long term smoking history

## 2017-10-21 NOTE — Assessment & Plan Note (Addendum)
No deficit in ROM or strength. No neurological deficits. Non tender to palpation of ribs. Unlikely fracture given no pain with movement. Likely sprain. Patient states pain is alleviated with tylenol.  -advised tylenol prn  -advised heating pad prn  -follow up in 2 weeks if no improvement  -no imaging needed at this time

## 2017-10-22 ENCOUNTER — Other Ambulatory Visit: Payer: Medicare Other

## 2017-10-22 ENCOUNTER — Other Ambulatory Visit: Payer: Self-pay | Admitting: Family Medicine

## 2017-10-22 NOTE — Telephone Encounter (Signed)
[facility] faxed a refill request for the following medication. Thanks CC  losartan (COZAAR) 100 MG tablet

## 2017-10-25 ENCOUNTER — Ambulatory Visit (HOSPITAL_COMMUNITY): Payer: Medicare Other | Attending: Family Medicine

## 2017-10-25 MED ORDER — LOSARTAN POTASSIUM 100 MG PO TABS: 100.0000 mg | ORAL_TABLET | Freq: Every day | ORAL | 5 refills | Status: DC

## 2017-10-25 NOTE — Telephone Encounter (Signed)
Will forward to PCP  Sarajane Fambrough, Dionne Bucy, MD, MPH [facility] 10/25/2017 11:54 AM

## 2017-11-03 DIAGNOSIS — F209 Schizophrenia, unspecified: Secondary | ICD-10-CM | POA: Diagnosis not present

## 2017-11-09 ENCOUNTER — Telehealth: Payer: Self-pay

## 2017-11-09 ENCOUNTER — Emergency Department (HOSPITAL_COMMUNITY)
Admission: EM | Admit: 2017-11-09 | Discharge: 2017-11-09 | Disposition: A | Discharge status: Home / Self Care | Payer: Medicare Other | Attending: Emergency Medicine | Admitting: Emergency Medicine

## 2017-11-09 ENCOUNTER — Other Ambulatory Visit: Payer: Self-pay

## 2017-11-09 ENCOUNTER — Encounter (HOSPITAL_COMMUNITY): Payer: Self-pay

## 2017-11-09 DIAGNOSIS — I10 Essential (primary) hypertension: Secondary | ICD-10-CM | POA: Diagnosis not present

## 2017-11-09 DIAGNOSIS — Z7982 Long term (current) use of aspirin: Secondary | ICD-10-CM | POA: Insufficient documentation

## 2017-11-09 DIAGNOSIS — Y69 Unspecified misadventure during surgical and medical care: Secondary | ICD-10-CM | POA: Diagnosis not present

## 2017-11-09 DIAGNOSIS — Z79899 Other long term (current) drug therapy: Secondary | ICD-10-CM | POA: Insufficient documentation

## 2017-11-09 DIAGNOSIS — F17210 Nicotine dependence, cigarettes, uncomplicated: Secondary | ICD-10-CM | POA: Insufficient documentation

## 2017-11-09 DIAGNOSIS — T50905A Adverse effect of unspecified drugs, medicaments and biological substances, initial encounter: Secondary | ICD-10-CM

## 2017-11-09 DIAGNOSIS — T360X5A Adverse effect of penicillins, initial encounter: Secondary | ICD-10-CM | POA: Diagnosis not present

## 2017-11-09 DIAGNOSIS — L299 Pruritus, unspecified: Secondary | ICD-10-CM | POA: Diagnosis not present

## 2017-11-09 DIAGNOSIS — T39315A Adverse effect of propionic acid derivatives, initial encounter: Secondary | ICD-10-CM | POA: Diagnosis not present

## 2017-11-09 DIAGNOSIS — T887XXA Unspecified adverse effect of drug or medicament, initial encounter: Secondary | ICD-10-CM | POA: Insufficient documentation

## 2017-11-09 MED ORDER — LORATADINE 10 MG PO TABS: 10.0000 mg | ORAL_TABLET | Freq: Every day | ORAL | 0 refills | Status: DC

## 2017-11-09 MED ORDER — FAMOTIDINE 20 MG PO TABS: 20.0000 mg | ORAL_TABLET | Freq: Two times a day (BID) | ORAL | 0 refills | Status: DC

## 2017-11-09 MED ORDER — PREDNISONE 20 MG PO TABS: 20.0000 mg | ORAL_TABLET | Freq: Every day | ORAL | 0 refills | Status: AC

## 2017-11-09 NOTE — ED Provider Notes (Signed)
[facility] EMERGENCY DEPARTMENT Provider Note   CSN: [id number] Arrival date & time: 11/09/17  1040     History   Chief Complaint Chief Complaint  Patient presents with  . Medication Reaction    HPI [name] is a 67 y.o. male.  HPI   Pt is a 67 y/o male who presents to the ED for a medication reaction that began yesterday.  He was given amoxicillin and ibuprofen on 3/22 by his dentist. Has allergy to PCN. States he began taking medication and then started itching all over his body yesterday so stopped taking the medication today. Is still having sxs of itchiness to entire body. Denies taking any medication to improve symptoms.  Denies rashes. Denies shortness of breath, difficulty swallowing, itchy throat, throat swelling, lip swelling, tongue swelling, wheezing, NVD, chest pain or any other symptoms.  Past Medical History:  Diagnosis Date  . BPH (benign prostatic hyperplasia)   . Colon polyp 2010  . Depression   . GERD (gastroesophageal reflux disease)   . Hepatitis C    "caught it when I had a blood transfusion"  . High cholesterol   . History of blood transfusion    "when I was young"  . Hypertension   . Paranoid schizophrenia ([place])   . Prostate atrophy   . Retinal vein occlusion     Patient Active Problem List   Diagnosis Date Noted  . Low back sprain, initial encounter 10/21/2017  . History of drug abuse 10/18/2017  . Gingival erythema 09/23/2017  . CAP (community acquired pneumonia) 01/21/2017  . Chronic pain of left knee 11/24/2016  . Tobacco abuse   . Onychomycosis 01/17/2016  . Diastolic heart failure ([place]) 02/05/2015  . Healthcare maintenance 05/28/2014  . Hyperlipidemia 03/10/2014  . Patellar tendonitis 11/16/2012  . [place] OCCLUSION 06/03/2010  . PARANOID SCHIZOPHRENIA, CHRONIC 01/17/2009  . HYPERTENSION, BENIGN ESSENTIAL 01/17/2009  . BPH (benign prostatic hyperplasia) 01/17/2009  . HEPATITIS C 12/14/2008  .  DEPRESSION 12/14/2008    Past Surgical History:  Procedure Laterality Date  . CIRCUMCISION  1959  . TONSILLECTOMY          Home Medications    Prior to Admission medications   Medication Sig Start Date End Date Taking? Authorizing Provider  acetaminophen (TYLENOL) 500 MG tablet Take 1 tablet (500 mg total) by mouth every 6 (six) hours as needed. 01/17/17   Law, Bea Graff, PA-C  amLODipine (NORVASC) 10 MG tablet Take 1 tablet (10 mg total) by mouth daily. 01/21/17   [name], MD  aspirin 81 MG tablet Take 1 tablet (81 mg total) by mouth daily. 09/24/14   [name], MD  atorvastatin (LIPITOR) 40 MG tablet take 1 tablet by mouth once daily 02/11/17   [name], MD  azithromycin (ZITHROMAX) 250 MG tablet Take 1 tablet (250 mg total) by mouth daily. 01/17/17   Law, Bea Graff, PA-C  benztropine (COGENTIN) 1 MG tablet Take 1 tablet (1 mg total) by mouth 2 (two) times daily. 11/05/16   [name], MD  cetirizine (ZYRTEC) 10 MG tablet take 1 tablet by mouth once daily 02/20/15   [name], MD  diclofenac sodium (VOLTAREN) 1 % GEL Apply 2 g topically 3 (three) times daily as needed. 03/26/17   Tegeler, Gwenyth Allegra, MD  famotidine (PEPCID) 20 MG tablet Take 1 tablet (20 mg total) by mouth 2 (two) times daily. 11/09/17   Olivya Sobol S, PA-C  fluPHENAZine  decanoate (PROLIXIN) 25 MG/ML injection Inject 25 mg into the muscle every 14 (fourteen) days. Last dose 09/07/12    [provider]  fluticasone (FLONASE) 50 MCG/ACT nasal spray Place 1 spray into both nostrils daily. 11/07/16   Shary Decamp, PA-C  hydrOXYzine (ATARAX/VISTARIL) 25 MG tablet Take 1 tablet (25 mg total) by mouth every 6 (six) hours. 12/15/16   Shary Decamp, PA-C  loratadine (CLARITIN) 10 MG tablet Take 1 tablet (10 mg total) by mouth daily. 11/09/17   Rosamund Nyland S, PA-C  losartan (COZAAR) 100 MG tablet Take 1 tablet (100 mg total) by mouth daily. 10/25/17   [name], DO    Multiple Vitamin (MULTIVITAMIN WITH MINERALS) TABS tablet Take 1 tablet by mouth daily. 06/28/14   Bacigalupo, Dionne Bucy, MD  predniSONE (DELTASONE) 20 MG tablet Take 1 tablet (20 mg total) by mouth daily for 5 days. 11/09/17 11/14/17  Sherlie Boyum S, PA-C  tamsulosin (FLOMAX) 0.4 MG CAPS capsule TAKE 2 CAPSULES BY MOUTH EVERY DAY 10/17/17   [name], DO  traMADol (ULTRAM) 50 MG tablet Take 1 tablet (50 mg total) by mouth every 6 (six) hours as needed. 09/23/17   Carlyle Dolly, MD  triamcinolone cream (KENALOG) 0.1 % Apply 1 application topically 2 (two) times daily. For 5 days, then twice daily as needed for rash and itching. 01/03/15   [name], MD  vitamin C (ASCORBIC ACID) 500 MG tablet Take 500 mg by mouth daily.    [provider]    Family History Family History  Problem Relation Age of Onset  . Hypertension Mother   . Diabetes Mother   . Stroke Mother     Social History Social History   Tobacco Use  . Smoking status: Current Every Day Smoker    Packs/day: 0.50    Years: 48.00    Pack years: 24.00    Types: Cigarettes  . Smokeless tobacco: Never Used  Substance Use Topics  . Alcohol use: No  . Drug use: No     Allergies   Lisinopril; Penicillins; Amoxicillin; and Ibuprofen   Review of Systems Review of Systems  Constitutional: Negative for fever.  HENT: Negative for sore throat, trouble swallowing and voice change.        No throat swelling  Eyes: Negative for visual disturbance.  Respiratory: Negative for cough, chest tightness, shortness of breath and wheezing.   Cardiovascular: Negative for chest pain and leg swelling.  Gastrointestinal: Negative for constipation, diarrhea, nausea and vomiting.  Genitourinary: Negative for decreased urine volume.  Musculoskeletal: Negative for back pain.  Skin: Negative for color change and rash.  Neurological: Negative for dizziness, light-headedness and headaches.     Physical Exam Updated  Vital Signs BP (!) 160/90 (BP Location: Right Arm)   Pulse 60   Temp 98 F (36.7 C) (Oral)   Resp 16   Ht 5\' 9"  (1.753 m)   Wt 80.7 kg (178 lb)   SpO2 100%   BMI 26.29 kg/m   Physical Exam  Constitutional: He is oriented to person, place, and time. He appears well-developed and well-nourished.  HENT:  Head: Normocephalic and atraumatic.  Nose: Nose normal.  Mouth/Throat: Oropharynx is clear and moist.  No angioedema.  Patient tolerating secretions.  No stridor noted.  Patent airway.  Eyes: Pupils are equal, round, and reactive to light. Conjunctivae and EOM are normal.  Neck: Normal range of motion. Neck supple.  Cardiovascular: Normal rate, regular rhythm, normal heart sounds and intact  distal pulses.  No murmur heard. Pulmonary/Chest: Effort normal and breath sounds normal. No stridor. No respiratory distress. He has no wheezes. He has no rales.  No tachypnea or respiratory distress  Abdominal: Soft. There is no tenderness.  Neurological: He is alert and oriented to person, place, and time.  Skin: Skin is warm and dry.  No rashes noted throughout the body  Psychiatric: He has a normal mood and affect.  Nursing note and vitals reviewed.    ED Treatments / Results  Labs (all labs ordered are listed, but only abnormal results are displayed) Labs Reviewed - No data to display  EKG None  Radiology No results found.  Procedures Procedures (including critical care time)  Medications Ordered in ED Medications - No data to display   Initial Impression / Assessment and Plan / ED Course  I have reviewed the triage vital signs and the nursing notes.  Pertinent labs & imaging results that were available during my care of the patient were reviewed by me and considered in my medical decision making (see chart for details).     Final Clinical Impressions(s) / ED Diagnoses   Final diagnoses:  Adverse effect of drug, initial encounter   Patient presenting with  medication reaction that began yesterday.  Complaining of itching, but denying any hives or rashes throughout his body.  No rash noted on exam. Patient denies any difficulty breathing or swallowing.  Pt has a patent airway without stridor and is handling secretions without difficulty; no angioedema. No blisters, no pustules, no warmth, no draining sinus tracts, no superficial abscesses, no bullous impetigo, no vesicles, no desquamation, no target lesions with dusky purpura or a central bulla. Not tender to touch. No concern for superimposed infection. No concern for SJS, TEN, TSS, tick borne illness, syphilis or other life-threatening condition. Pt with grossly normal VS with exception of HTN. No evidence of EOD and doubt HTN urgency/emergency. Did not take bp meds today. Encouraged to take meds when he gets home and to f/u with pcp for bp recheck. Advised him to contact his dentist to get new rx for abx. Will discharge home with short course of steroids, pepcid and recommend claritin as needed for pruritis.  Patient agrees to contact his dentist.  Advised PCP follow-up within 1 week for reevaluation.  Advised on strict return precautions for any new or worsening symptoms.  ED Discharge Orders        Ordered    predniSONE (DELTASONE) 20 MG tablet  Daily     11/09/17 1307    famotidine (PEPCID) 20 MG tablet  2 times daily     11/09/17 1307    loratadine (CLARITIN) 10 MG tablet  Daily     11/09/17 1307       Rodney Booze, PA-C 11/09/17 1312    Pattricia Boss, MD 11/09/17 1650

## 2017-11-09 NOTE — Discharge Instructions (Signed)
You were given a prescription for Pepcid, Claritin, and prednisone to take for your symptoms.  You should take 1 tablet of Pepcid twice daily.  You should take 1 tablet of Claritin on daily.  You should take 1 tablet of prednisone daily.  If you have any adverse reactions any of these medications you should stop taking them immediately.  You should stop taking the medicines that were given to you by your dentist that caused you  symptoms.  You need to contact your dentist today to let them know you have stopped taking the penicillin, and you will need to have a different antibiotic prescription.  Please follow-up with your primary care doctor within about 1 week for recheck.  Return to the emergency department for any difficulty breathing, difficulty swallowing, throat swelling, lip swelling, wheezing, shortness of breath, or any new or worsening symptoms.

## 2017-11-09 NOTE — ED Triage Notes (Signed)
Pt endorses rash after taking ibuprofen and amoxicillen yesterday which he is taking for dental problem. No rash visible by this RN but pt states "I still itch on my back" VSS.

## 2017-11-09 NOTE — Telephone Encounter (Signed)
Pt walked in to clinic. Wanting to be seen for a rash on his back. Pt given the option to be added to overflow or go to urgent care. Pt decided to go to urgent care. Wallace Cullens, RN

## 2017-11-09 NOTE — ED Notes (Signed)
Pt verbalized understanding of discharge paperwork. Pt ambulatory, reports he has not taken his BP medications today.

## 2017-11-09 NOTE — ED Notes (Signed)
Pt given water. No issues swallowing.

## 2017-11-17 DIAGNOSIS — F209 Schizophrenia, unspecified: Secondary | ICD-10-CM | POA: Diagnosis not present

## 2017-11-19 DIAGNOSIS — F209 Schizophrenia, unspecified: Secondary | ICD-10-CM | POA: Diagnosis not present

## 2017-11-26 ENCOUNTER — Other Ambulatory Visit: Payer: Self-pay | Admitting: Family Medicine

## 2017-11-26 DIAGNOSIS — N400 Enlarged prostate without lower urinary tract symptoms: Secondary | ICD-10-CM

## 2017-11-27 ENCOUNTER — Telehealth: Payer: Self-pay | Admitting: Internal Medicine

## 2017-11-27 DIAGNOSIS — N400 Enlarged prostate without lower urinary tract symptoms: Secondary | ICD-10-CM

## 2017-11-27 MED ORDER — TAMSULOSIN HCL 0.4 MG PO CAPS: 0.8000 mg | ORAL_CAPSULE | Freq: Every day | ORAL | 0 refills | Status: DC

## 2017-11-27 NOTE — Telephone Encounter (Signed)
Received page to emergency after hours line. Patient requesting flomax. Has run out. Explained that this line is meant for emergencies only but refilled medication and counseled patient to call pharmacy prior to running out of medication in the future.  Olene Floss, MD [place], PGY-3

## 2017-12-01 DIAGNOSIS — F209 Schizophrenia, unspecified: Secondary | ICD-10-CM | POA: Diagnosis not present

## 2017-12-04 NOTE — Progress Notes (Deleted)
   Subjective:    Patient ID: [name], male    DOB: [birth year], 67 y.o.   MRN: [id number]   CC:  HPI: Hypertension: - Medications: amlodipine 10mg  daily, losartan 100mg  daily, ASA 81mg  daily  - Compliance: *** - Checking BP at home: *** - Denies any SOB, CP, vision changes, LE edema, medication SEs, or symptoms of hypotension - Diet: *** - Exercise: ***  Hyperlipidemia Meds: lipitor 40mg  daily Diet: *** Exercise: ***   Smoking status reviewed  Review of Systems   Objective:  There were no vitals taken for this visit. Vitals and nursing note reviewed  General: well nourished, in no acute distress HEENT: normocephalic, TM's visualized bilaterally, no scleral icterus or conjunctival pallor, no nasal discharge, moist mucous membranes, good dentition without erythema or discharge noted in posterior oropharynx Neck: supple, non-tender, without lymphadenopathy Cardiac: RRR, clear S1 and S2, no murmurs, rubs, or gallops Respiratory: clear to auscultation bilaterally, no increased work of breathing Abdomen: soft, nontender, nondistended, no masses or organomegaly. Bowel sounds present Extremities: no edema or cyanosis. Warm, well perfused. 2+ radial and PT pulses bilaterally Skin: warm and dry, no rashes noted Neuro: alert and oriented, no focal deficits   Assessment & Plan:    No problem-specific Assessment & Plan notes found for this encounter.    No follow-ups on file.   [name], DO, PGY-1

## 2017-12-08 ENCOUNTER — Ambulatory Visit: Payer: Medicare Other | Admitting: Family Medicine

## 2017-12-15 ENCOUNTER — Other Ambulatory Visit: Payer: Self-pay

## 2017-12-15 DIAGNOSIS — F209 Schizophrenia, unspecified: Secondary | ICD-10-CM | POA: Diagnosis not present

## 2017-12-15 MED ORDER — LOSARTAN POTASSIUM 100 MG PO TABS: 100.0000 mg | ORAL_TABLET | Freq: Every day | ORAL | 5 refills | Status: DC

## 2018-01-03 NOTE — Progress Notes (Signed)
Subjective:    Patient ID: [name], male    DOB: [birth year], 67 y.o.   MRN: [id number]   CC: HTN, HLD, tooth pain   HPI: Hypertension: - Medications: amlodipine 10mg  daily, losartan 100mg  daily, ASA 81mg  daily  - Compliance: good - Checking BP at home: no  - Denies any SOB, CP, vision changes, LE edema, medication SEs, or symptoms of hypotension - Diet: white meat chicken, salads  - Exercise: daily, walks every day, going to start jogging soon   Hyperlipidemia Meds: lipitor 40mg  daily Diet: as above Exercise: as above  Tooth abscess Patient complaining of tooth abscess since February. Patient was seen by Dr. Juanito Doom in February for tooth abscess and was given clindamycin 300mg  tid x10days and states he had some improvement. Denies drainage. States the pain is worsened when chewing on that side. Denies fever or chills. Denies nausea or vomiting but states he had diarrhea for 2-3 days this week (so unlikely related to clindamycin use). Denies SOB. States some facial swelling on right side. Patient had seen a dentist 2 months ago and has a follow up appointment scheduled with them on either 5/27 or 5/28 (could not remember which date but it is written down at home). No OTC pain medications used.    Tobacco abuse Patient reports he is still smoking but trying to cut down. He is now smoking 8 cigarettes a day. Has not called 1-800-QUIT-NOW hotline. States he wants to get down to 5 cigarettes a day before calling.   Objective:  BP 130/84   Pulse 72   Temp 98.7 F (37.1 C) (Oral)   Ht 5\' 9"  (1.753 m)   Wt 172 lb 6.4 oz (78.2 kg)   SpO2 99%   BMI 25.46 kg/m  Vitals and nursing note reviewed  General: well nourished, in no acute distress HEENT: normocephalic, PERRL, no scleral icterus or conjunctival pallor, no nasal discharge, moist mucous membranes, poor dentition without erythema or discharge noted in posterior oropharynx, no drainage however white lesion noted on right side  lower mandibular gum.  Neck: supple, non-tender, without lymphadenopathy Cardiac: RRR, clear S1 and S2, no murmurs, rubs, or gallops Respiratory: clear to auscultation bilaterally, no increased work of breathing Abdomen: soft, nontender, nondistended, no masses or organomegaly. Bowel sounds present Extremities: no edema or cyanosis. Warm, well perfused.   Skin: warm and dry, no rashes noted Neuro: alert and oriented, no focal deficits   Assessment & Plan:    HYPERTENSION, BENIGN ESSENTIAL BP at goal today at 130/84. States that he only checks his blood pressure at doctors appointments and they have been similar readings. No reported symptoms of hyper or hypotension. Will continue current regimen -continue: amlodipine 10mg  daily, losartan 100mg  daily, ASA 81mg  daily  -follow up in 3 months or sooner if symptoms persist or worsen   Hyperlipidemia Lipid panel ordered today -continue lipitor 40mg  daily  -encouraged daily exercise and healthy diet   Gingival erythema Gingival erythema noted on R lower mandibular gums. Patient missing teeth and has poor overall dentition. Patient is followed closely by dentist and has a follow up scheduled in <1 week. Will initiate antibiotic treatment with clindamycin 300mg  tid x6 days (until dental appointment) at which time care should be taken over by dentistry.  -follow up as needed with [facility] -no pain medications needed and given h/o narcotic abuse will not prescribe any pain medications  Tobacco abuse Counseled on health benefits of smoking cessation. Resources given for 1-800-QUIT=NOW hotline and  informed to call and follow up for cessation assistance. Phone number written in AVS    Return in about 3 months (around 04/07/2018).   [name], DO, PGY-1

## 2018-01-05 ENCOUNTER — Encounter: Payer: Self-pay | Admitting: Family Medicine

## 2018-01-05 ENCOUNTER — Other Ambulatory Visit: Payer: Self-pay

## 2018-01-05 ENCOUNTER — Ambulatory Visit (INDEPENDENT_AMBULATORY_CARE_PROVIDER_SITE_OTHER): Payer: Medicare Other | Admitting: Family Medicine

## 2018-01-05 VITALS — BP 130/84 | HR 72 | Temp 98.7°F | Ht 69.0 in | Wt 172.4 lb

## 2018-01-05 DIAGNOSIS — I10 Essential (primary) hypertension: Secondary | ICD-10-CM

## 2018-01-05 DIAGNOSIS — Z720 Tobacco use: Secondary | ICD-10-CM

## 2018-01-05 DIAGNOSIS — K068 Other specified disorders of gingiva and edentulous alveolar ridge: Secondary | ICD-10-CM | POA: Diagnosis not present

## 2018-01-05 DIAGNOSIS — K047 Periapical abscess without sinus: Secondary | ICD-10-CM

## 2018-01-05 DIAGNOSIS — E785 Hyperlipidemia, unspecified: Secondary | ICD-10-CM | POA: Diagnosis not present

## 2018-01-05 DIAGNOSIS — F209 Schizophrenia, unspecified: Secondary | ICD-10-CM | POA: Diagnosis not present

## 2018-01-05 MED ORDER — CLINDAMYCIN HCL 300 MG PO CAPS: 300.0000 mg | ORAL_CAPSULE | Freq: Three times a day (TID) | ORAL | 0 refills | Status: AC

## 2018-01-05 NOTE — Assessment & Plan Note (Addendum)
Gingival erythema noted on R lower mandibular gums. Patient missing teeth and has poor overall dentition. Patient is followed closely by dentist and has a follow up scheduled in <1 week. Will initiate antibiotic treatment with clindamycin 300mg  tid x6 days (until dental appointment) at which time care should be taken over by dentistry.  -follow up as needed with [facility] -no pain medications needed and given h/o narcotic abuse will not prescribe any pain medications

## 2018-01-05 NOTE — Assessment & Plan Note (Signed)
BP at goal today at 130/84. States that he only checks his blood pressure at doctors appointments and they have been similar readings. No reported symptoms of hyper or hypotension. Will continue current regimen -continue: amlodipine 10mg  daily, losartan 100mg  daily, ASA 81mg  daily  -follow up in 3 months or sooner if symptoms persist or worsen

## 2018-01-05 NOTE — Assessment & Plan Note (Signed)
Lipid panel ordered today -continue lipitor 40mg  daily  -encouraged daily exercise and healthy diet

## 2018-01-05 NOTE — Patient Instructions (Signed)
Preventing High Cholesterol Cholesterol is a waxy, fat-like substance that your body needs in small amounts. Your liver makes all the cholesterol that your body needs. Having high cholesterol (hypercholesterolemia) increases your risk for heart disease and stroke. Extra (excess) cholesterol comes from the food you eat, such as animal-based fat (saturated fat) from meat and some dairy products. High cholesterol can often be prevented with diet and lifestyle changes. If you already have high cholesterol, you can control it with diet and lifestyle changes, as well as medicine. What nutrition changes can be made?  Eat less saturated fat. Foods that contain saturated fat include red meat and some dairy products.  Avoid processed meats, like bacon and lunch meats.  Avoid trans fats, which are found in margarine and some baked goods.  Avoid foods and beverages that have added sugars.  Eat more fruits, vegetables, and whole grains.  Choose healthy sources of protein, such as fish, poultry, and nuts.  Choose healthy sources of fat, such as: ? Nuts. ? Vegetable oils, especially olive oil. ? Fish that have healthy fats (omega-3 fatty acids), such as mackerel or salmon. What lifestyle changes can be made?  Lose weight if you are overweight. Losing 5-10 lb (2.3-4.5 kg) can help prevent or control high cholesterol and reduce your risk for diabetes and high blood pressure. Ask your health care provider to help you with a diet and exercise plan to safely lose weight.  Get enough exercise. Do at least 150 minutes of moderate-intensity exercise each week. ? You could do this in short exercise sessions several times a day, or you could do longer exercise sessions a few times a week. For example, you could take a brisk 10-minute walk or bike ride, 3 times a day, for 5 days a week.  Do not smoke. If you need help quitting, ask your health care provider.  Limit your alcohol intake. If you drink alcohol,  limit alcohol intake to no more than 1 drink a day for nonpregnant women and 2 drinks a day for men. One drink equals 12 oz of beer, 5 oz of wine, or 1 oz of hard liquor. Why are these changes important? If you have high cholesterol, deposits (plaques) may build up on the walls of your blood vessels. Plaques make the arteries narrower and stiffer, which can restrict or block blood flow and cause blood clots to form. This greatly increases your risk for heart attack and stroke. Making diet and lifestyle changes can reduce your risk for these life-threatening conditions. What can I do to lower my risk?  Manage your risk factors for high cholesterol. Talk with your health care provider about all of your risk factors and how to lower your risk.  Manage other conditions that you have, such as diabetes or high blood pressure (hypertension).  Have your cholesterol checked at regular intervals.  Keep all follow-up visits as told by your health care provider. This is important. How is this treated? In addition to diet and lifestyle changes, your health care provider may recommend medicines to help lower cholesterol, such as a medicine to reduce the amount of cholesterol made in your liver. You may need medicine if:  Diet and lifestyle changes do not lower your cholesterol enough.  You have high cholesterol and other risk factors for heart disease or stroke.  Take over-the-counter and prescription medicines only as told by your health care provider. Where to find more information:  American Heart Association: ThisTune.com.pt.jsp  National Heart, Lung, and  Blood Institute: FrenchToiletries.com.cy Summary  High cholesterol increases your risk for heart disease and stroke. By keeping your cholesterol level low, you can reduce your risk for these conditions.  Diet and lifestyle changes  are the most important steps in preventing high cholesterol.  Work with your health care provider to manage your risk factors, and have your blood tested regularly. This information is not intended to replace advice given to you by your health care provider. Make sure you discuss any questions you have with your health care provider. Document Released: 08/18/2015 Document Revised: 04/11/2016 Document Reviewed: 04/11/2016 Elsevier Interactive Patient Education  [employer].   It was a pleasure seeing you today.   Today we discussed your blood pressure, cholesterol, and tooth abscess  For your blood pressure: please continue the amlodipine 10mg  daily, and losartan 100mg  daily  For your cholesterol: please continue lipitor 40mg  daily  For your tooth abscess: PLEASE SEE YOUR DENTIST AS SOON AS YOU CAN. I have given you a prescription for clindamycin to be taken three times a day until your dentist appointment on Monday.   Please consider stopping smoking. Congratulations on cutting back!!! Please call 1800-QUIT-NOW for assistance in quitting.   Continue to exercise daily and eat healthy.   Please follow up in 3 months or sooner if symptoms persist or worsen. Please call the clinic immediately if you have any concerns.   Our clinic's number is [phone]. Please call with questions or concerns.    Thank you,  [name], DO

## 2018-01-05 NOTE — Assessment & Plan Note (Signed)
Counseled on health benefits of smoking cessation. Resources given for 1-800-QUIT=NOW hotline and informed to call and follow up for cessation assistance. Phone number written in AVS

## 2018-01-06 ENCOUNTER — Encounter: Payer: Self-pay | Admitting: Family Medicine

## 2018-01-06 LAB — LIPID PANEL
Chol/HDL Ratio: 2.5 ratio (ref 0.0–5.0)
Cholesterol, Total: 86 mg/dL — ABNORMAL LOW (ref 100–199)
HDL: 34 mg/dL — ABNORMAL LOW (ref >39)
LDL Calculated: 37 mg/dL (ref 0–99)
Triglycerides: 73 mg/dL (ref 0–149)
VLDL Cholesterol Cal: 15 mg/dL (ref 5–40)

## 2018-01-08 ENCOUNTER — Emergency Department (HOSPITAL_COMMUNITY)
Admission: EM | Admit: 2018-01-08 | Discharge: 2018-01-08 | Disposition: A | Discharge status: Home / Self Care | Payer: Medicare Other | Attending: Emergency Medicine | Admitting: Emergency Medicine

## 2018-01-08 ENCOUNTER — Other Ambulatory Visit: Payer: Self-pay

## 2018-01-08 ENCOUNTER — Encounter (HOSPITAL_COMMUNITY): Payer: Self-pay | Admitting: Emergency Medicine

## 2018-01-08 DIAGNOSIS — T50904A Poisoning by unspecified drugs, medicaments and biological substances, undetermined, initial encounter: Secondary | ICD-10-CM | POA: Diagnosis not present

## 2018-01-08 DIAGNOSIS — Z7982 Long term (current) use of aspirin: Secondary | ICD-10-CM | POA: Diagnosis not present

## 2018-01-08 DIAGNOSIS — R197 Diarrhea, unspecified: Secondary | ICD-10-CM | POA: Diagnosis not present

## 2018-01-08 DIAGNOSIS — I110 Hypertensive heart disease with heart failure: Secondary | ICD-10-CM | POA: Diagnosis not present

## 2018-01-08 DIAGNOSIS — Z79899 Other long term (current) drug therapy: Secondary | ICD-10-CM | POA: Insufficient documentation

## 2018-01-08 DIAGNOSIS — I5030 Unspecified diastolic (congestive) heart failure: Secondary | ICD-10-CM | POA: Insufficient documentation

## 2018-01-08 DIAGNOSIS — R110 Nausea: Secondary | ICD-10-CM | POA: Diagnosis not present

## 2018-01-08 DIAGNOSIS — F17210 Nicotine dependence, cigarettes, uncomplicated: Secondary | ICD-10-CM | POA: Diagnosis not present

## 2018-01-08 DIAGNOSIS — R109 Unspecified abdominal pain: Secondary | ICD-10-CM | POA: Diagnosis not present

## 2018-01-08 MED ORDER — ONDANSETRON 4 MG PO TBDP
4.0000 mg | ORAL_TABLET | Freq: Once | ORAL | Status: AC | PRN
  Administered 2018-01-08: 4 mg via ORAL
  Filled 2018-01-08: qty 1

## 2018-01-08 NOTE — ED Provider Notes (Signed)
[facility] DEPT Provider Note   CSN: [id number] Arrival date & time: 01/08/18  0308     History   Chief Complaint Chief Complaint  Patient presents with  . Vomiting    HPI [name] is a 67 y.o. male.  HPI [name] is a 66 y.o. male presents to ED with complaint of diarrhea and nausea. States had two episodes of diarrhea overnight with some abdominal cramping. States had nausea but denies vomiting to me. Did not take any medications prior to coming in. No other complaints. Poor historian. Denies fever, chills. States all symptoms now resolved and he feels normal.   Past Medical History:  Diagnosis Date  . BPH (benign prostatic hyperplasia)   . Colon polyp 2010  . Depression   . GERD (gastroesophageal reflux disease)   . Hepatitis C    "caught it when I had a blood transfusion"  . High cholesterol   . History of blood transfusion    "when I was young"  . Hypertension   . Paranoid schizophrenia ([place])   . Prostate atrophy   . Retinal vein occlusion     Patient Active Problem List   Diagnosis Date Noted  . Low back sprain, initial encounter 10/21/2017  . History of drug abuse 10/18/2017  . Gingival erythema 09/23/2017  . Chronic pain of left knee 11/24/2016  . Tobacco abuse   . Diastolic heart failure ([place]) 02/05/2015  . Healthcare maintenance 05/28/2014  . Hyperlipidemia 03/10/2014  . Patellar tendonitis 11/16/2012  . [place] OCCLUSION 06/03/2010  . PARANOID SCHIZOPHRENIA, CHRONIC 01/17/2009  . HYPERTENSION, BENIGN ESSENTIAL 01/17/2009  . BPH (benign prostatic hyperplasia) 01/17/2009  . HEPATITIS C 12/14/2008  . DEPRESSION 12/14/2008    Past Surgical History:  Procedure Laterality Date  . CIRCUMCISION  1959  . TONSILLECTOMY          Home Medications    Prior to Admission medications   Medication Sig Start Date End Date Taking? Authorizing Provider  acetaminophen (TYLENOL) 500 MG tablet Take 1  tablet (500 mg total) by mouth every 6 (six) hours as needed. 01/17/17   Law, Bea Graff, PA-C  amLODipine (NORVASC) 10 MG tablet Take 1 tablet (10 mg total) by mouth daily. 01/21/17   [name], MD  aspirin 81 MG tablet Take 1 tablet (81 mg total) by mouth daily. 09/24/14   [name], MD  atorvastatin (LIPITOR) 40 MG tablet take 1 tablet by mouth once daily 02/11/17   [name], MD  azithromycin (ZITHROMAX) 250 MG tablet Take 1 tablet (250 mg total) by mouth daily. 01/17/17   Law, Bea Graff, PA-C  benztropine (COGENTIN) 1 MG tablet Take 1 tablet (1 mg total) by mouth 2 (two) times daily. 11/05/16   [name], MD  cetirizine (ZYRTEC) 10 MG tablet take 1 tablet by mouth once daily 02/20/15   [name], MD  clindamycin (CLEOCIN) 300 MG capsule Take 1 capsule (300 mg total) by mouth 3 (three) times daily for 6 days. 01/05/18 01/11/18  [name], DO  diclofenac sodium (VOLTAREN) 1 % GEL Apply 2 g topically 3 (three) times daily as needed. 03/26/17   Tegeler, Gwenyth Allegra, MD  famotidine (PEPCID) 20 MG tablet Take 1 tablet (20 mg total) by mouth 2 (two) times daily. 11/09/17   Couture, Cortni S, PA-C  fluPHENAZine decanoate (PROLIXIN) 25 MG/ML injection Inject 25 mg into the muscle every 14 (fourteen) days. Last dose 09/07/12  [provider]  fluticasone (FLONASE) 50 MCG/ACT nasal spray Place 1 spray into both nostrils daily. 11/07/16   Shary Decamp, PA-C  hydrOXYzine (ATARAX/VISTARIL) 25 MG tablet Take 1 tablet (25 mg total) by mouth every 6 (six) hours. 12/15/16   Shary Decamp, PA-C  loratadine (CLARITIN) 10 MG tablet Take 1 tablet (10 mg total) by mouth daily. 11/09/17   Couture, Cortni S, PA-C  losartan (COZAAR) 100 MG tablet Take 1 tablet (100 mg total) by mouth daily. 12/15/17   [name], DO  Multiple Vitamin (MULTIVITAMIN WITH MINERALS) TABS tablet Take 1 tablet by mouth daily. 06/28/14   [name], MD  tamsulosin (FLOMAX) 0.4 MG CAPS  capsule TAKE 2 CAPSULES BY MOUTH EVERY DAY 11/29/17   [name], DO  tamsulosin (FLOMAX) 0.4 MG CAPS capsule Take 2 capsules (0.8 mg total) by mouth daily. 11/27/17   [name], MD  traMADol (ULTRAM) 50 MG tablet Take 1 tablet (50 mg total) by mouth every 6 (six) hours as needed. 09/23/17   Carlyle Dolly, MD  triamcinolone cream (KENALOG) 0.1 % Apply 1 application topically 2 (two) times daily. For 5 days, then twice daily as needed for rash and itching. 01/03/15   [name], MD  vitamin C (ASCORBIC ACID) 500 MG tablet Take 500 mg by mouth daily.    [provider]    Family History Family History  Problem Relation Age of Onset  . Hypertension Mother   . Diabetes Mother   . Stroke Mother     Social History Social History   Tobacco Use  . Smoking status: Current Every Day Smoker    Packs/day: 0.50    Years: 48.00    Pack years: 24.00    Types: Cigarettes  . Smokeless tobacco: Never Used  Substance Use Topics  . Alcohol use: No  . Drug use: No     Allergies   Lisinopril; Penicillins; Amoxicillin; and Ibuprofen   Review of Systems Review of Systems  Constitutional: Negative for chills and fever.  Respiratory: Negative for cough, chest tightness and shortness of breath.   Cardiovascular: Negative for chest pain, palpitations and leg swelling.  Gastrointestinal: Positive for abdominal pain, diarrhea and nausea. Negative for abdominal distention and vomiting.  Genitourinary: Negative for dysuria, frequency, hematuria and urgency.  Musculoskeletal: Negative for arthralgias, myalgias, neck pain and neck stiffness.  Skin: Negative for rash.  Allergic/Immunologic: Negative for immunocompromised state.  Neurological: Negative for dizziness, weakness, light-headedness, numbness and headaches.  All other systems reviewed and are negative.    Physical Exam Updated Vital Signs BP (!) 144/77 (BP Location: Left Arm)   Pulse 82   Temp 98 F  (36.7 C) (Oral)   Resp 20   Ht 5\' 9"  (1.753 m)   Wt 78 kg (172 lb)   SpO2 100%   BMI 25.40 kg/m   Physical Exam  Constitutional: He appears well-developed and well-nourished. No distress.  HENT:  Head: Normocephalic and atraumatic.  Eyes: Conjunctivae are normal.  Neck: Neck supple.  Cardiovascular: Normal rate, regular rhythm and normal heart sounds.  Pulmonary/Chest: Effort normal. No respiratory distress. He has no wheezes. He has no rales.  Abdominal: Soft. Bowel sounds are normal. He exhibits no distension. There is no tenderness. There is no rebound and no guarding.  Musculoskeletal: He exhibits no edema.  Neurological: He is alert.  Skin: Skin is warm and dry.  Nursing note and vitals reviewed.    ED Treatments / Results  Labs (  all labs ordered are listed, but only abnormal results are displayed) Labs Reviewed - No data to display  EKG None  Radiology No results found.  Procedures Procedures (including critical care time)  Medications Ordered in ED Medications  ondansetron (ZOFRAN-ODT) disintegrating tablet 4 mg (4 mg Oral Given 01/08/18 0519)     Initial Impression / Assessment and Plan / ED Course  I have reviewed the triage vital signs and the nursing notes.  Pertinent labs & imaging results that were available during my care of the patient were reviewed by me and considered in my medical decision making (see chart for details).     Patient in the emergency department with nausea diarrhea abdominal pain that started just prior to arrival.  He had 2 episodes of diarrhea.  States his symptoms have now all resolved and he feels fine.  He was given Zofran by triage RN.  We will do p.o. trial and see if he can tolerate liquids.  His abdomen is benign on exam, is nontender.  I do not think he needs any further testing or imaging in emergency department.  Vitals:   01/08/18 0500 01/08/18 0530 01/08/18 0538 01/08/18 0600  BP: 139/75 140/77 (!) 144/77 (!)  149/79  Pulse: 61 (!) 59 82 (!) 58  Resp:   20 18  Temp:      TempSrc:      SpO2: 100% 100% 100% 100%  Weight:      Height:        Final Clinical Impressions(s) / ED Diagnoses   Final diagnoses:  Diarrhea, unspecified type    ED Discharge Orders    None       Jeannett Senior, PA-C 01/08/18 9233    Orlie Dakin, MD 01/08/18 [phone]

## 2018-01-08 NOTE — Discharge Instructions (Addendum)
Drink plenty of fliuds. Take imodium for diarrhea as needed. Follow up with your doctor.

## 2018-01-08 NOTE — ED Triage Notes (Signed)
Pt brought in by EMS from home  Pt states he believes he was poisoned by a cigarette he was given 2 days ago  Pt states he tried to drink a cup of coffee earlier tonight and had an episode of vomiting

## 2018-01-08 NOTE — ED Notes (Signed)
Pt was given ice water for PO/fluid challenge---- pt drank the whole cup well; pt denies nausea at this time.

## 2018-01-24 ENCOUNTER — Other Ambulatory Visit: Payer: Self-pay | Admitting: *Deleted

## 2018-01-25 MED ORDER — LOSARTAN POTASSIUM 100 MG PO TABS: 100.0000 mg | ORAL_TABLET | Freq: Every day | ORAL | 5 refills | Status: DC

## 2018-01-26 DIAGNOSIS — F209 Schizophrenia, unspecified: Secondary | ICD-10-CM | POA: Diagnosis not present

## 2018-01-27 ENCOUNTER — Other Ambulatory Visit: Payer: Self-pay | Admitting: Family Medicine

## 2018-01-27 DIAGNOSIS — N400 Enlarged prostate without lower urinary tract symptoms: Secondary | ICD-10-CM

## 2018-02-03 DIAGNOSIS — F209 Schizophrenia, unspecified: Secondary | ICD-10-CM | POA: Diagnosis not present

## 2018-02-09 ENCOUNTER — Ambulatory Visit: Payer: Medicare Other

## 2018-02-09 DIAGNOSIS — F209 Schizophrenia, unspecified: Secondary | ICD-10-CM | POA: Diagnosis not present

## 2018-02-16 ENCOUNTER — Other Ambulatory Visit: Payer: Self-pay

## 2018-02-16 ENCOUNTER — Ambulatory Visit (INDEPENDENT_AMBULATORY_CARE_PROVIDER_SITE_OTHER): Payer: Medicare Other | Admitting: Family Medicine

## 2018-02-16 ENCOUNTER — Encounter: Payer: Self-pay | Admitting: Family Medicine

## 2018-02-16 VITALS — BP 118/80 | HR 61 | Temp 98.0°F | Wt 172.0 lb

## 2018-02-16 DIAGNOSIS — K047 Periapical abscess without sinus: Secondary | ICD-10-CM | POA: Diagnosis present

## 2018-02-16 DIAGNOSIS — K068 Other specified disorders of gingiva and edentulous alveolar ridge: Secondary | ICD-10-CM | POA: Diagnosis not present

## 2018-02-16 DIAGNOSIS — Z720 Tobacco use: Secondary | ICD-10-CM | POA: Diagnosis not present

## 2018-02-16 MED ORDER — CLINDAMYCIN HCL 300 MG PO CAPS: 300.0000 mg | ORAL_CAPSULE | Freq: Three times a day (TID) | ORAL | 0 refills | Status: AC

## 2018-02-16 NOTE — Progress Notes (Signed)
   Subjective:    Patient ID: [name], male    DOB: 07-12-1951, 67 y.o.   MRN: [id number]   CC: tooth abscess   HPI: Tooth abscess Patient today presented with complaints of tooth abscess.  This is a recurrent problem and he has been seen in clinic by both Dr. Juanito Doom and myself in the past.  Patient states that symptoms usually improve or resolve with clindamycin treatment.  Patient had previously scheduled a dental appointment but stated that "something got messed up" and appointment was canceled.  Patient says that he called back to reschedule this appointment and the closest appointment time was on July 11.  Patient says most recent abscess occurred 2-1/2 to 3 weeks ago.  Patient denies any drainage.  Patient says pain is worse when chewing food on that side.  Denies any nausea vomiting.  States he has some intermittent diarrhea "every once in a while".  Denies any fevers or chills.  Denies any swelling of his face.  Has not tried any over-the-counter treatments.  Patient says he has an appointment scheduled for next week on 02/24/18 at his dentist which is at the corner of [address].  Tobacco abuse Patient reports that he is still smoking but is continuing to cut down.  Says he is down to 3 cigarettes a day, which is down from 5 cigarettes.  He has not called the 1 800 quit now hotline yet, but is interested in calling.  Objective:  BP 118/80   Pulse 61   Temp 98 F (36.7 C) (Oral)   Wt 172 lb (78 kg)   SpO2 99%   BMI 25.40 kg/m  Vitals and nursing note reviewed  General: well nourished, in no acute distress HEENT: normocephalic, no scleral icterus or conjunctival pallor, no nasal discharge, moist mucous membranes, poor dentition without erythema or discharge noted in posterior oropharynx, no drainage noted however erythema present on right side of lower mandibular gum Neck: supple, non-tender, without lymphadenopathy Cardiac: RRR, clear S1 and S2, no murmurs, rubs, or  gallops Respiratory: clear to auscultation bilaterally, no increased work of breathing Abdomen: soft, nontender, nondistended, no masses or organomegaly. Bowel sounds present Extremities: no edema or cyanosis. Warm, well perfused.  Skin: warm and dry, no rashes noted Neuro: alert and oriented, no focal deficits   Assessment & Plan:    Gingival erythema Gingival erythema noted on right lower mandibular gum.  Patient also has poor overall dentition with multiple missing teeth.  Patient has a follow-up appointment scheduled on a dentist for 7-11/19, and says he plans on calling dentist to see if he can move that appointment up closer.  Will initiate antibiotic treatment with clindamycin 300 mg 3 times daily x7 days (until dental appointment) at which time care should be taken over by her dentist.  Stressed importance of seeing dentist and educated patient on antibiotic resistance.  Patient verbalized understanding.  Use teach back to ensure patient understood importance of seeing dentist. -Follow up with dentist -Follow-up as needed with [facility] -No pain medications needed at this time   Tobacco abuse Counseled on health benefits of smoking cessation and congratulated on cutting down to 3 cigarettes a day.  Card for 1 800 quit now hotline given to patient as well as discussed during visit.  Urged patient to follow-up in clinic for smoking cessation as well.    Return if symptoms worsen or fail to improve.   [name], DO, PGY-2

## 2018-02-16 NOTE — Assessment & Plan Note (Signed)
Gingival erythema noted on right lower mandibular gum.  Patient also has poor overall dentition with multiple missing teeth.  Patient has a follow-up appointment scheduled on a dentist for 7-11/19, and says he plans on calling dentist to see if he can move that appointment up closer.  Will initiate antibiotic treatment with clindamycin 300 mg 3 times daily x7 days (until dental appointment) at which time care should be taken over by her dentist.  Stressed importance of seeing dentist and educated patient on antibiotic resistance.  Patient verbalized understanding.  Use teach back to ensure patient understood importance of seeing dentist. -Follow up with dentist -Follow-up as needed with [facility] -No pain medications needed at this time

## 2018-02-16 NOTE — Patient Instructions (Signed)
It was a pleasure seeing you today.   Today we discussed your tooth abscess  For your abscess: I will give you clindamycin to be taken 3 times a day for 7 days. IT IS VERY IMPORTANT THAT YOU GO SEE YOUR DENTIST ON 02/24/18 at your already scheduled appointment. If you can go earlier that would be ideal. Please call and let me know when you go see your dentist.   Congratulations on cutting down cigarettes. Please continue to cut back as we discussed. I have given a card with the information for the quit now hotline.   Please follow up as needed or sooner if symptoms persist or worsen. Please call the clinic immediately if you have any concerns.   Our clinic's number is [phone]. Please call with questions or concerns.   Please go to the emergency room if you start to develop a fever or the tooth is worsening.   Thank you,  [name], DO

## 2018-02-16 NOTE — Assessment & Plan Note (Signed)
Counseled on health benefits of smoking cessation and congratulated on cutting down to 3 cigarettes a day.  Card for 1 800 quit now hotline given to patient as well as discussed during visit.  Urged patient to follow-up in clinic for smoking cessation as well.

## 2018-02-21 DIAGNOSIS — K7469 Other cirrhosis of liver: Secondary | ICD-10-CM | POA: Diagnosis not present

## 2018-02-28 ENCOUNTER — Other Ambulatory Visit: Payer: Self-pay | Admitting: Family Medicine

## 2018-02-28 DIAGNOSIS — N400 Enlarged prostate without lower urinary tract symptoms: Secondary | ICD-10-CM

## 2018-03-03 ENCOUNTER — Other Ambulatory Visit: Payer: Self-pay | Admitting: Nurse Practitioner

## 2018-03-03 ENCOUNTER — Other Ambulatory Visit: Payer: Self-pay | Admitting: Family Medicine

## 2018-03-03 DIAGNOSIS — K7469 Other cirrhosis of liver: Secondary | ICD-10-CM

## 2018-03-16 ENCOUNTER — Other Ambulatory Visit: Payer: Self-pay | Admitting: Family Medicine

## 2018-03-16 DIAGNOSIS — E7800 Pure hypercholesterolemia, unspecified: Secondary | ICD-10-CM

## 2018-03-16 DIAGNOSIS — F209 Schizophrenia, unspecified: Secondary | ICD-10-CM | POA: Diagnosis not present

## 2018-03-16 NOTE — Telephone Encounter (Signed)
Will forward to PCP  Bacigalupo, Dionne Bucy, MD, MPH [facility] 03/16/2018 2:24 PM

## 2018-03-21 ENCOUNTER — Ambulatory Visit
Admission: RE | Admit: 2018-03-21 | Discharge: 2018-03-21 | Disposition: A | Discharge status: Home / Self Care | Payer: Medicare Other | Source: Ambulatory Visit | Attending: Nurse Practitioner | Admitting: Nurse Practitioner

## 2018-03-21 DIAGNOSIS — K743 Primary biliary cirrhosis: Secondary | ICD-10-CM | POA: Diagnosis not present

## 2018-03-21 DIAGNOSIS — K7469 Other cirrhosis of liver: Secondary | ICD-10-CM

## 2018-03-30 DIAGNOSIS — F209 Schizophrenia, unspecified: Secondary | ICD-10-CM | POA: Diagnosis not present

## 2018-04-02 ENCOUNTER — Other Ambulatory Visit: Payer: Self-pay | Admitting: Family Medicine

## 2018-04-02 DIAGNOSIS — N400 Enlarged prostate without lower urinary tract symptoms: Secondary | ICD-10-CM

## 2018-04-13 ENCOUNTER — Other Ambulatory Visit: Payer: Self-pay

## 2018-04-13 ENCOUNTER — Encounter: Payer: Self-pay | Admitting: Family Medicine

## 2018-04-13 ENCOUNTER — Ambulatory Visit (INDEPENDENT_AMBULATORY_CARE_PROVIDER_SITE_OTHER): Payer: Medicare Other | Admitting: Family Medicine

## 2018-04-13 VITALS — BP 120/80 | HR 98 | Temp 97.9°F | Ht 69.0 in | Wt 165.2 lb

## 2018-04-13 DIAGNOSIS — Z23 Encounter for immunization: Secondary | ICD-10-CM

## 2018-04-13 DIAGNOSIS — Z720 Tobacco use: Secondary | ICD-10-CM

## 2018-04-13 DIAGNOSIS — Z0000 Encounter for general adult medical examination without abnormal findings: Secondary | ICD-10-CM

## 2018-04-13 DIAGNOSIS — I10 Essential (primary) hypertension: Secondary | ICD-10-CM | POA: Diagnosis not present

## 2018-04-13 DIAGNOSIS — K068 Other specified disorders of gingiva and edentulous alveolar ridge: Secondary | ICD-10-CM

## 2018-04-13 MED ORDER — AMLODIPINE BESYLATE 10 MG PO TABS: 10.0000 mg | ORAL_TABLET | Freq: Every day | ORAL | 5 refills | Status: DC

## 2018-04-13 MED ORDER — LOSARTAN POTASSIUM 100 MG PO TABS: 100.0000 mg | ORAL_TABLET | Freq: Every day | ORAL | 5 refills | Status: DC

## 2018-04-13 NOTE — Assessment & Plan Note (Signed)
Counseled on health benefits of smoking cessation and congratulated patient on continuing to stay at 3 cigarettes a day.  Patient reporting that on September 1 he plans to cut down to 1 to 2 cigarettes a day.  Patient is also calling 1 800 quit line for assistance.  Patient does not want any patches or prescriptions for gum today.  States he would like to just cut down on cigarettes.  Urged patient to continue to cut down with a goal of tobacco cessation.

## 2018-04-13 NOTE — Assessment & Plan Note (Signed)
BP at goal today at 120/80.  Home blood pressure readings are also within goal.  Denies any symptoms of hyper or hypotension.  Patient is very compliant on medication regimen.  We will plan to continue current regimen as well as obtain baseline labs of BMP and CBC.  Recent lipid panel in May so will not need to repeat at this time. -Continue: Amlodipine 10 mg daily, will start in the 100 mg daily -Follow-up in 3 months or sooner if symptoms persist or worsen -Obtain BMP and CBC

## 2018-04-13 NOTE — Progress Notes (Signed)
   Subjective:    Patient ID: [name], male    DOB: September 03, 1950, 67 y.o.   MRN: [id number]   CC: HTN  HPI: Hypertension: - Medications: amlodipine 10mg , losartan 100mg , ASA 81mg   - Compliance: good - Checking BP at home: yes, usually 120-126/80-86 - Denies any SOB, CP, vision changes, LE edema, medication SEs, or symptoms of hypotension - Diet: eats eggs, grits and toast for breakfast. Uses salt substitute. Does not eat greasy or fried foods  - Exercise: walks daily, going to start jogging on September 1st  Tobacco abuse Patient reports cutting back on cigarette use.  States he is down to using 3 cigarettes a day.  Patient has been calling 1 800 quit line which he states helps.  Patient plans on cutting down to 1 to 2 cigarettes a day on September 1.  Patient is unsure of when he would like to quit smoking completely.  States he is going to continue to work towards that goal.  Objective:  BP 120/80   Pulse 98   Temp 97.9 F (36.6 C) (Oral)   Ht 5\' 9"  (1.753 m)   Wt 165 lb 3.2 oz (74.9 kg)   SpO2 98%   BMI 24.40 kg/m  Vitals and nursing note reviewed  General: well nourished, in no acute distress HEENT: normocephalic, PERRL, no nasal discharge, moist mucous membranes, poor dentition without erythema or discharge noted in posterior oropharynx Cardiac: RRR, clear S1 and S2, no murmurs, rubs, or gallops Respiratory: clear to auscultation bilaterally, no increased work of breathing Abdomen: soft, nontender, nondistended, no masses or organomegaly. Bowel sounds present Extremities: no edema or cyanosis. Warm, well perfused.  2+ radial pulses bilaterally Skin: warm and dry, no rashes noted Neuro: alert and oriented, no focal deficits   Assessment & Plan:    HYPERTENSION, BENIGN ESSENTIAL BP at goal today at 120/80.  Home blood pressure readings are also within goal.  Denies any symptoms of hyper or hypotension.  Patient is very compliant on medication regimen.  We will plan  to continue current regimen as well as obtain baseline labs of BMP and CBC.  Recent lipid panel in May so will not need to repeat at this time. -Continue: Amlodipine 10 mg daily, will start in the 100 mg daily -Follow-up in 3 months or sooner if symptoms persist or worsen -Obtain BMP and CBC  Tobacco abuse Counseled on health benefits of smoking cessation and congratulated patient on continuing to stay at 3 cigarettes a day.  Patient reporting that on September 1 he plans to cut down to 1 to 2 cigarettes a day.  Patient is also calling 1 800 quit line for assistance.  Patient does not want any patches or prescriptions for gum today.  States he would like to just cut down on cigarettes.  Urged patient to continue to cut down with a goal of tobacco cessation.  Healthcare maintenance PCV13 vaccine and flu vaccine given today. Informed patient to follow up in 6-12 months for PPSV23 vaccine.     Return in about 3 months (around 07/14/2018).   [name], DO, PGY-2

## 2018-04-13 NOTE — Assessment & Plan Note (Addendum)
PCV13 vaccine and flu vaccine given today. Informed patient to follow up in 6-12 months for PPSV23 vaccine.

## 2018-04-13 NOTE — Patient Instructions (Signed)
It was a pleasure seeing you today.   Today we discussed your blood pressure  For your blood pressure: it was normal today! Please continue your amlodipine and losartan. I have obtained blood work today. I will either call or send a letter with results.   Please follow up in 3 months for blood pressure. Please follow up in 6-12 months for your second pneumonia vaccine.   Please follow up in 3 months or sooner if symptoms persist or worsen. Please call the clinic immediately if you have any concerns.   Our clinic's number is [phone]. Please call with questions or concerns.   Thank you for getting the flu vaccine today! It will help protect you this flu season.   Please go to the emergency room if you have shortness of breath or chest pain.   Thank you,  [name], DO

## 2018-04-14 DIAGNOSIS — H524 Presbyopia: Secondary | ICD-10-CM | POA: Diagnosis not present

## 2018-04-14 DIAGNOSIS — H52203 Unspecified astigmatism, bilateral: Secondary | ICD-10-CM | POA: Diagnosis not present

## 2018-04-14 DIAGNOSIS — H31001 Unspecified chorioretinal scars, right eye: Secondary | ICD-10-CM | POA: Diagnosis not present

## 2018-04-14 LAB — BASIC METABOLIC PANEL
BUN/Creatinine Ratio: 5 — ABNORMAL LOW (ref 10–24)
BUN: 9 mg/dL (ref 8–27)
CO2: 26 mmol/L (ref 20–29)
Calcium: 9.1 mg/dL (ref 8.6–10.2)
Chloride: 104 mmol/L (ref 96–106)
Creatinine, Ser: 1.66 mg/dL — ABNORMAL HIGH (ref 0.76–1.27)
GFR calc Af Amer: 49 mL/min/{1.73_m2} — ABNORMAL LOW (ref >59)
GFR calc non Af Amer: 42 mL/min/{1.73_m2} — ABNORMAL LOW (ref >59)
Glucose: 97 mg/dL (ref 65–99)
Potassium: 4.4 mmol/L (ref 3.5–5.2)
Sodium: 145 mmol/L — ABNORMAL HIGH (ref 134–144)

## 2018-04-14 LAB — CBC
Hematocrit: 41.6 % (ref 37.5–51.0)
Hemoglobin: 14.4 g/dL (ref 13.0–17.7)
MCH: 30.4 pg (ref 26.6–33.0)
MCHC: 34.6 g/dL (ref 31.5–35.7)
MCV: 88 fL (ref 79–97)
Platelets: 131 10*3/uL — ABNORMAL LOW (ref 150–450)
RBC: 4.74 x10E6/uL (ref 4.14–5.80)
RDW: 13.8 % (ref 12.3–15.4)
WBC: 5.5 10*3/uL (ref 3.4–10.8)

## 2018-04-20 ENCOUNTER — Other Ambulatory Visit: Payer: Self-pay | Admitting: Family Medicine

## 2018-04-20 ENCOUNTER — Telehealth: Payer: Self-pay | Admitting: *Deleted

## 2018-04-20 DIAGNOSIS — R7989 Other specified abnormal findings of blood chemistry: Secondary | ICD-10-CM

## 2018-04-20 NOTE — Telephone Encounter (Signed)
-----   Message from [name], DO sent at 04/20/2018 11:35 AM EDT ----- Please inform patient that lab work looks very similar to previous labs. I will repeat BMP (kidney levels) in 6 months to ensure no worsening of kidney function.

## 2018-04-20 NOTE — Progress Notes (Signed)
Patient with labs showing similar Cr and GFR compared to 1 year ago. Will plan to repeat BMP in 6 months and if worsening can refer to nephrology.   Dalphine Handing, PGY-2 [facility] 04/20/2018 11:37 AM

## 2018-04-20 NOTE — Telephone Encounter (Signed)
Attempted to call pt and it says phone not in service. [name], CMA

## 2018-04-23 ENCOUNTER — Emergency Department (HOSPITAL_COMMUNITY)
Admission: EM | Admit: 2018-04-23 | Discharge: 2018-04-23 | Disposition: A | Discharge status: Home / Self Care | Payer: Medicare Other | Attending: Emergency Medicine | Admitting: Emergency Medicine

## 2018-04-23 ENCOUNTER — Encounter (HOSPITAL_COMMUNITY): Payer: Self-pay | Admitting: Emergency Medicine

## 2018-04-23 DIAGNOSIS — I110 Hypertensive heart disease with heart failure: Secondary | ICD-10-CM | POA: Insufficient documentation

## 2018-04-23 DIAGNOSIS — Z7982 Long term (current) use of aspirin: Secondary | ICD-10-CM | POA: Diagnosis not present

## 2018-04-23 DIAGNOSIS — F17210 Nicotine dependence, cigarettes, uncomplicated: Secondary | ICD-10-CM | POA: Insufficient documentation

## 2018-04-23 DIAGNOSIS — M25562 Pain in left knee: Secondary | ICD-10-CM

## 2018-04-23 DIAGNOSIS — Z79899 Other long term (current) drug therapy: Secondary | ICD-10-CM | POA: Insufficient documentation

## 2018-04-23 DIAGNOSIS — I5030 Unspecified diastolic (congestive) heart failure: Secondary | ICD-10-CM | POA: Diagnosis not present

## 2018-04-23 MED ORDER — ACETAMINOPHEN 500 MG PO TABS: 500.0000 mg | ORAL_TABLET | Freq: Four times a day (QID) | ORAL | 0 refills | Status: DC | PRN

## 2018-04-23 NOTE — Discharge Instructions (Addendum)
1. Medications: Take [phone] mg of Tylenol every 6 hours as needed for pain. Do not exceed 4000 mg of Tylenol daily.  2. Treatment: rest, ice, elevate and use brace, drink plenty of fluids, gentle stretching 3. Follow Up: Please followup with orthopedics as directed or your PCP in 1 week if no improvement for discussion of your diagnoses and further evaluation after today's visit;  Please return to the ER for worsening symptoms or other concerns such as worsening swelling, redness of the skin, fevers, loss of pulses, or loss of feeling

## 2018-04-23 NOTE — ED Notes (Signed)
See EDP assessment 

## 2018-04-23 NOTE — ED Notes (Signed)
Pt verbalizes understanding of d/c instructions. Prescriptions reviewed with patient. Pt ambulatory at d/c with all belongings.  

## 2018-04-23 NOTE — ED Provider Notes (Signed)
[facility] EMERGENCY DEPARTMENT Provider Note   CSN: [id number] Arrival date & time: 04/23/18  1945     History   Chief Complaint Chief Complaint  Patient presents with  . Knee Pain    HPI [name] is a 67 y.o. male with history of BPH, depression, GERD, hepatitis C, hyperlipidemia, hypertension, paranoid schizophrenia presents for evaluation of acute on chronic exacerbation of left knee pain.  He notes that he has had chronic knee pain since the 1970s secondary to a military injury.  He notes pain for the past week or so which has been intermittent and primarily located to the inferior aspect of the left knee.  Pain does not radiate.  Pain worsens when the weather changes and he thinks his symptoms began when it was raining recently.  Describes the pain as an aching sensation.  Denies numbness, weakness, fevers, chills.  He has tried wearing a knee brace and applying ice with some improvement.  He sees an orthopedist regularly for knee injections.  The history is provided by the patient.    Past Medical History:  Diagnosis Date  . BPH (benign prostatic hyperplasia)   . Colon polyp 2010  . Depression   . GERD (gastroesophageal reflux disease)   . Hepatitis C    "caught it when I had a blood transfusion"  . High cholesterol   . History of blood transfusion    "when I was young"  . Hypertension   . Paranoid schizophrenia ([place])   . Prostate atrophy   . Retinal vein occlusion     Patient Active Problem List   Diagnosis Date Noted  . Low back sprain, initial encounter 10/21/2017  . History of drug abuse 10/18/2017  . Gingival erythema 09/23/2017  . Chronic pain of left knee 11/24/2016  . Tobacco abuse   . Diastolic heart failure ([place]) 02/05/2015  . Healthcare maintenance 05/28/2014  . Hyperlipidemia 03/10/2014  . Patellar tendonitis 11/16/2012  . [place] OCCLUSION 06/03/2010  . PARANOID SCHIZOPHRENIA, CHRONIC 01/17/2009  . HYPERTENSION,  BENIGN ESSENTIAL 01/17/2009  . BPH (benign prostatic hyperplasia) 01/17/2009  . HEPATITIS C 12/14/2008  . DEPRESSION 12/14/2008    Past Surgical History:  Procedure Laterality Date  . CIRCUMCISION  1959  . TONSILLECTOMY          Home Medications    Prior to Admission medications   Medication Sig Start Date End Date Taking? Authorizing Provider  acetaminophen (TYLENOL) 500 MG tablet Take 1 tablet (500 mg total) by mouth every 6 (six) hours as needed. 04/23/18   Shonta Phillis A, PA-C  amLODipine (NORVASC) 10 MG tablet Take 1 tablet (10 mg total) by mouth daily. 04/13/18   [name], DO  aspirin 81 MG tablet Take 1 tablet (81 mg total) by mouth daily. 09/24/14   [name], MD  atorvastatin (LIPITOR) 40 MG tablet TAKE 1 TABLET BY MOUTH ONCE DAILY 03/16/18   [name], DO  azithromycin (ZITHROMAX) 250 MG tablet Take 1 tablet (250 mg total) by mouth daily. 01/17/17   Law, Bea Graff, PA-C  benztropine (COGENTIN) 1 MG tablet Take 1 tablet (1 mg total) by mouth 2 (two) times daily. 11/05/16   [name], MD  cetirizine (ZYRTEC) 10 MG tablet take 1 tablet by mouth once daily 02/20/15   [name], MD  diclofenac sodium (VOLTAREN) 1 % GEL Apply 2 g topically 3 (three) times daily as needed. 03/26/17   Tegeler, Gwenyth Allegra, MD  famotidine (  PEPCID) 20 MG tablet Take 1 tablet (20 mg total) by mouth 2 (two) times daily. 11/09/17   Couture, Cortni S, PA-C  fluPHENAZine decanoate (PROLIXIN) 25 MG/ML injection Inject 25 mg into the muscle every 14 (fourteen) days. Last dose 09/07/12    [provider]  fluticasone (FLONASE) 50 MCG/ACT nasal spray Place 1 spray into both nostrils daily. 11/07/16   Shary Decamp, PA-C  loratadine (CLARITIN) 10 MG tablet Take 1 tablet (10 mg total) by mouth daily. 11/09/17   Couture, Cortni S, PA-C  losartan (COZAAR) 100 MG tablet Take 1 tablet (100 mg total) by mouth daily. 04/13/18   [name], DO  Multiple Vitamin (MULTIVITAMIN WITH  MINERALS) TABS tablet Take 1 tablet by mouth daily. 06/28/14   [name], MD  tamsulosin (FLOMAX) 0.4 MG CAPS capsule Take 2 capsules (0.8 mg total) by mouth daily. 11/27/17   [name], MD  tamsulosin (FLOMAX) 0.4 MG CAPS capsule TAKE 2 CAPSULES BY MOUTH DAILY 04/04/18   [name], DO  traMADol (ULTRAM) 50 MG tablet Take 1 tablet (50 mg total) by mouth every 6 (six) hours as needed. 09/23/17   Carlyle Dolly, MD  triamcinolone cream (KENALOG) 0.1 % Apply 1 application topically 2 (two) times daily. For 5 days, then twice daily as needed for rash and itching. 01/03/15   [name], MD  vitamin C (ASCORBIC ACID) 500 MG tablet Take 500 mg by mouth daily.    [provider]    Family History Family History  Problem Relation Age of Onset  . Hypertension Mother   . Diabetes Mother   . Stroke Mother     Social History Social History   Tobacco Use  . Smoking status: Current Every Day Smoker    Packs/day: 0.50    Years: 48.00    Pack years: 24.00    Types: Cigarettes  . Smokeless tobacco: Never Used  Substance Use Topics  . Alcohol use: No  . Drug use: No     Allergies   Lisinopril; Penicillins; Amoxicillin; and Ibuprofen   Review of Systems Review of Systems  Constitutional: Negative for chills and fever.  Musculoskeletal: Positive for arthralgias.  Neurological: Negative for syncope, weakness and numbness.     Physical Exam Updated Vital Signs BP (!) 148/98 (BP Location: Left Arm)   Pulse 69   Temp 97.9 F (36.6 C) (Oral)   Resp 18   Ht 5\' 9"  (1.753 m)   Wt 74.5 kg   SpO2 100%   BMI 24.26 kg/m   Physical Exam  Constitutional: He appears well-developed and well-nourished. No distress.  Pleasant affect, resting comfortably in chair  HENT:  Head: Normocephalic and atraumatic.  Eyes: Conjunctivae are normal. Right eye exhibits no discharge. Left eye exhibits no discharge.  Neck: No JVD present. No tracheal  deviation present.  Cardiovascular: Normal rate and intact distal pulses.  2+ DP/PT pulses bilaterally, Homans sign absent bilaterally, no lower extremity edema, no palpable cords, compartments are soft   Pulmonary/Chest: Effort normal.  Abdominal: He exhibits no distension.  Musculoskeletal: Normal range of motion. He exhibits tenderness. He exhibits no edema.       Left knee: He exhibits normal range of motion, no swelling, no effusion, no ecchymosis, no deformity, no laceration, no erythema, normal alignment, no LCL laxity, normal patellar mobility, normal meniscus and no MCL laxity. Tenderness found. Patellar tendon tenderness noted.  Wearing left knee sleeve.  Some tenderness overlying the left tibial tuberosity.  No erythema, warmth, swelling, ecchymosis, or crepitus.  Normal active range of motion of the left knee.  5/5 strength of BLE major muscle groups.  No quadriceps tendon deformity and patient is able to extend the knee against gravity without difficulty.  No ligamentous laxity or varus or valgus instability.  Neurological: He is alert.  Fluent speech, facial droop, sensation intact to soft touch of bilateral lower extremities.  Patient able to ambulate with steady gait, able to Heel Walk and Toe Walk without difficulty.  Skin: Skin is warm and dry. No erythema.  Psychiatric: He has a normal mood and affect. His behavior is normal.  Nursing note and vitals reviewed.    ED Treatments / Results  Labs (all labs ordered are listed, but only abnormal results are displayed) Labs Reviewed - No data to display  EKG None  Radiology No results found.  Procedures Procedures (including critical care time)  Medications Ordered in ED Medications - No data to display   Initial Impression / Assessment and Plan / ED Course  I have reviewed the triage vital signs and the nursing notes.  Pertinent labs & imaging results that were available during my care of the patient were reviewed  by me and considered in my medical decision making (see chart for details).     Patient with acute on chronic exacerbation of left knee pain thought to be secondary to drain last week.  He is afebrile, vital signs are stable.  He is nontoxic in appearance.  He is very pleasant and resting comfortably in chair.  Ambulatory without difficulty.  Neurovascularly intact, compartments are soft.  Doubt DVT, septic joint, osteomyelitis.  Recommend Tylenol, rest, ice, compression, and elevation.  He will follow-up with his orthopedist Dr. Theda Sers for reevaluation.  Discussed strict ED return precautions. Pt verbalized understanding of and agreement with plan and is safe for discharge home at this time.  No complaints prior to discharge.  Final Clinical Impressions(s) / ED Diagnoses   Final diagnoses:  Acute pain of left knee    ED Discharge Orders         Ordered    acetaminophen (TYLENOL) 500 MG tablet  Every 6 hours PRN     04/23/18 2236           Renita Papa, PA-C 04/23/18 2237    Pattricia Boss, MD 04/28/18 1400

## 2018-04-23 NOTE — ED Triage Notes (Signed)
Reports chronic left knee pain since the 70's.  Increased aching today.

## 2018-04-23 NOTE — ED Notes (Signed)
Pt ambulatory from waiting room to A9

## 2018-04-24 ENCOUNTER — Encounter (HOSPITAL_COMMUNITY): Payer: Self-pay | Admitting: Emergency Medicine

## 2018-04-24 ENCOUNTER — Emergency Department (HOSPITAL_COMMUNITY)
Admission: EM | Admit: 2018-04-24 | Discharge: 2018-04-24 | Disposition: A | Discharge status: Home / Self Care | Payer: Medicare Other | Attending: Emergency Medicine | Admitting: Emergency Medicine

## 2018-04-24 ENCOUNTER — Other Ambulatory Visit: Payer: Self-pay

## 2018-04-24 ENCOUNTER — Emergency Department (HOSPITAL_COMMUNITY): Payer: Medicare Other

## 2018-04-24 DIAGNOSIS — G8929 Other chronic pain: Secondary | ICD-10-CM

## 2018-04-24 DIAGNOSIS — M25562 Pain in left knee: Secondary | ICD-10-CM | POA: Insufficient documentation

## 2018-04-24 DIAGNOSIS — Z7982 Long term (current) use of aspirin: Secondary | ICD-10-CM | POA: Diagnosis not present

## 2018-04-24 DIAGNOSIS — Z79899 Other long term (current) drug therapy: Secondary | ICD-10-CM | POA: Insufficient documentation

## 2018-04-24 DIAGNOSIS — F17210 Nicotine dependence, cigarettes, uncomplicated: Secondary | ICD-10-CM | POA: Diagnosis not present

## 2018-04-24 DIAGNOSIS — I5030 Unspecified diastolic (congestive) heart failure: Secondary | ICD-10-CM | POA: Insufficient documentation

## 2018-04-24 DIAGNOSIS — I110 Hypertensive heart disease with heart failure: Secondary | ICD-10-CM | POA: Diagnosis not present

## 2018-04-24 DIAGNOSIS — S8992XA Unspecified injury of left lower leg, initial encounter: Secondary | ICD-10-CM | POA: Diagnosis not present

## 2018-04-24 NOTE — Discharge Instructions (Signed)
We recommend that you continue with Tylenol or ibuprofen for pain.  Follow-up with your orthopedist.

## 2018-04-24 NOTE — ED Triage Notes (Addendum)
Pt reports when he left the ER to go smoke, pt hit his left knee on the concrete pillar. 10/10 pain. Pt ambulatory in triage.

## 2018-04-24 NOTE — ED Notes (Addendum)
Pt says "I'm gone" and waves goodbye as he leaves triage area. Pt seen leaving waiting room to parking lot.

## 2018-04-24 NOTE — ED Notes (Signed)
Pt walks back into triage area. Pt returns to A3 and wants to be seen.

## 2018-04-24 NOTE — ED Provider Notes (Signed)
[facility] Provider Note   CSN: [id number] Arrival date & time: 04/24/18  0126     History   Chief Complaint Chief Complaint  Patient presents with  . Knee Pain    HPI [name] is a 67 y.o. male.   67 year old male presents to the emergency department for evaluation of persistent left knee pain.  Was seen earlier for similar complaints.  States that he left the emergency department to smoke a cigarette and accidentally struck his left knee on a concrete pillar.  Reports worsening pain, rated 10/10.  Has been ambulatory since the incident.  Has been taking Tylenol for management of his chronic knee pain.  Reports being followed by Dr. Theda Sers of orthopedics.     Past Medical History:  Diagnosis Date  . BPH (benign prostatic hyperplasia)   . Colon polyp 2010  . Depression   . GERD (gastroesophageal reflux disease)   . Hepatitis C    "caught it when I had a blood transfusion"  . High cholesterol   . History of blood transfusion    "when I was young"  . Hypertension   . Paranoid schizophrenia ([place])   . Prostate atrophy   . Retinal vein occlusion     Patient Active Problem List   Diagnosis Date Noted  . Low back sprain, initial encounter 10/21/2017  . History of drug abuse 10/18/2017  . Gingival erythema 09/23/2017  . Chronic pain of left knee 11/24/2016  . Tobacco abuse   . Diastolic heart failure ([place]) 02/05/2015  . Healthcare maintenance 05/28/2014  . Hyperlipidemia 03/10/2014  . Patellar tendonitis 11/16/2012  . [place] OCCLUSION 06/03/2010  . PARANOID SCHIZOPHRENIA, CHRONIC 01/17/2009  . HYPERTENSION, BENIGN ESSENTIAL 01/17/2009  . BPH (benign prostatic hyperplasia) 01/17/2009  . HEPATITIS C 12/14/2008  . DEPRESSION 12/14/2008    Past Surgical History:  Procedure Laterality Date  . CIRCUMCISION  1959  . TONSILLECTOMY          Home Medications    Prior to Admission medications   Medication  Sig Start Date End Date Taking? Authorizing Provider  acetaminophen (TYLENOL) 500 MG tablet Take 1 tablet (500 mg total) by mouth every 6 (six) hours as needed. 04/23/18   [name], [name], PA-C  amLODipine (NORVASC) 10 MG tablet Take 1 tablet (10 mg total) by mouth daily. 04/13/18   [name], DO  aspirin 81 MG tablet Take 1 tablet (81 mg total) by mouth daily. 09/24/14   [name], MD  atorvastatin (LIPITOR) 40 MG tablet TAKE 1 TABLET BY MOUTH ONCE DAILY 03/16/18   [name], DO  azithromycin (ZITHROMAX) 250 MG tablet Take 1 tablet (250 mg total) by mouth daily. 01/17/17   Law, Bea Graff, PA-C  benztropine (COGENTIN) 1 MG tablet Take 1 tablet (1 mg total) by mouth 2 (two) times daily. 11/05/16   [name], MD  cetirizine (ZYRTEC) 10 MG tablet take 1 tablet by mouth once daily 02/20/15   [name], MD  diclofenac sodium (VOLTAREN) 1 % GEL Apply 2 g topically 3 (three) times daily as needed. 03/26/17   Tegeler, Gwenyth Allegra, MD  famotidine (PEPCID) 20 MG tablet Take 1 tablet (20 mg total) by mouth 2 (two) times daily. 11/09/17   Couture, Cortni S, PA-C  fluPHENAZine decanoate (PROLIXIN) 25 MG/ML injection Inject 25 mg into the muscle every 14 (fourteen) days. Last dose 09/07/12    [provider]  fluticasone (FLONASE) 50 MCG/ACT nasal  spray Place 1 spray into both nostrils daily. 11/07/16   Shary Decamp, PA-C  loratadine (CLARITIN) 10 MG tablet Take 1 tablet (10 mg total) by mouth daily. 11/09/17   Couture, Cortni S, PA-C  losartan (COZAAR) 100 MG tablet Take 1 tablet (100 mg total) by mouth daily. 04/13/18   [name], DO  Multiple Vitamin (MULTIVITAMIN WITH MINERALS) TABS tablet Take 1 tablet by mouth daily. 06/28/14   [name], MD  tamsulosin (FLOMAX) 0.4 MG CAPS capsule Take 2 capsules (0.8 mg total) by mouth daily. 11/27/17   [name], MD  tamsulosin (FLOMAX) 0.4 MG CAPS capsule TAKE 2 CAPSULES BY MOUTH DAILY 04/04/18   [name], DO  traMADol (ULTRAM) 50 MG tablet Take 1 tablet (50 mg total) by mouth every 6 (six) hours as needed. 09/23/17   Carlyle Dolly, MD  triamcinolone cream (KENALOG) 0.1 % Apply 1 application topically 2 (two) times daily. For 5 days, then twice daily as needed for rash and itching. 01/03/15   [name], MD  vitamin C (ASCORBIC ACID) 500 MG tablet Take 500 mg by mouth daily.    [provider]    Family History Family History  Problem Relation Age of Onset  . Hypertension Mother   . Diabetes Mother   . Stroke Mother     Social History Social History   Tobacco Use  . Smoking status: Current Every Day Smoker    Packs/day: 0.50    Years: 48.00    Pack years: 24.00    Types: Cigarettes  . Smokeless tobacco: Never Used  Substance Use Topics  . Alcohol use: No  . Drug use: No     Allergies   Lisinopril; Penicillins; Amoxicillin; and Ibuprofen   Review of Systems Review of Systems Ten systems reviewed and are negative for acute change, except as noted in the HPI.    Physical Exam Updated Vital Signs BP (!) 158/64 (BP Location: Left Arm)   Pulse 88   Temp 98.7 F (37.1 C) (Oral)   Resp 18   Ht 5\' 9"  (1.753 m)   Wt 79.8 kg   SpO2 98%   BMI 25.99 kg/m   Physical Exam  Constitutional: He is oriented to person, place, and time. He appears well-developed and well-nourished. No distress.  Nontoxic.  HENT:  Head: Normocephalic and atraumatic.  Eyes: Conjunctivae and EOM are normal. No scleral icterus.  Neck: Normal range of motion.  Cardiovascular: Normal rate, regular rhythm and intact distal pulses.  DP pulse 2+ in the LLE  Pulmonary/Chest: Effort normal. No respiratory distress.  Respirations even and unlabored  Musculoskeletal: Normal range of motion.  Normal ROM of the L knee. No deformity or crepitus.  Neurological: He is alert and oriented to person, place, and time. He exhibits normal muscle tone. Coordination normal.  Skin: Skin  is warm and dry. No rash noted. He is not diaphoretic. No erythema. No pallor.  Psychiatric: He has a normal mood and affect. His behavior is normal.  Nursing note and vitals reviewed.    ED Treatments / Results  Labs (all labs ordered are listed, but only abnormal results are displayed) Labs Reviewed - No data to display  EKG None  Radiology Dg Knee Complete 4 Views Left  Result Date: 04/24/2018 CLINICAL DATA:  Patient hit knee against stone pillar. Is having pain all around knee. EXAM: LEFT KNEE - COMPLETE 4+ VIEW COMPARISON:  03/25/2017 FINDINGS: No evidence of fracture, dislocation, or joint  effusion. No evidence of arthropathy or other focal bone abnormality. Soft tissues are unremarkable. IMPRESSION: Negative. Electronically Signed   By: Lucienne Capers M.D.   On: 04/24/2018 02:21    Procedures Procedures (including critical care time)  Medications Ordered in ED Medications - No data to display   Initial Impression / Assessment and Plan / ED Course  I have reviewed the triage vital signs and the nursing notes.  Pertinent labs & imaging results that were available during my care of the patient were reviewed by me and considered in my medical decision making (see chart for details).     Patient presents to the emergency department for evaluation of L knee pain. Patient neurovascularly intact on exam; ambulatory. Imaging negative for fracture, dislocation, bony deformity. No swelling, erythema, heat to touch to the affected area; no concern for septic joint. Compartments in the affected extremity are soft. Plan for supportive management including RICE and NSAIDs; primary care follow up as needed. Return precautions discussed and provided. Patient discharged in stable condition with no unaddressed concerns.   Final Clinical Impressions(s) / ED Diagnoses   Final diagnoses:  Chronic pain of left knee    ED Discharge Orders    None       Antonietta Breach, PA-C 04/24/18  [place], MD 04/24/18 [phone]

## 2018-04-29 ENCOUNTER — Ambulatory Visit: Payer: Medicare Other | Admitting: Family Medicine

## 2018-05-12 DIAGNOSIS — F209 Schizophrenia, unspecified: Secondary | ICD-10-CM | POA: Diagnosis not present

## 2018-05-26 DIAGNOSIS — F209 Schizophrenia, unspecified: Secondary | ICD-10-CM | POA: Diagnosis not present

## 2018-05-31 DIAGNOSIS — F209 Schizophrenia, unspecified: Secondary | ICD-10-CM | POA: Diagnosis not present

## 2018-06-03 ENCOUNTER — Ambulatory Visit: Payer: Medicare Other | Admitting: Family Medicine

## 2018-06-09 DIAGNOSIS — F209 Schizophrenia, unspecified: Secondary | ICD-10-CM | POA: Diagnosis not present

## 2018-06-23 DIAGNOSIS — F209 Schizophrenia, unspecified: Secondary | ICD-10-CM | POA: Diagnosis not present

## 2018-06-27 ENCOUNTER — Ambulatory Visit: Payer: Medicare Other

## 2018-07-04 ENCOUNTER — Ambulatory Visit (INDEPENDENT_AMBULATORY_CARE_PROVIDER_SITE_OTHER): Payer: Medicare Other | Admitting: Family Medicine

## 2018-07-04 VITALS — BP 100/65 | HR 66 | Temp 97.6°F | Ht 69.0 in | Wt 165.2 lb

## 2018-07-04 DIAGNOSIS — G8929 Other chronic pain: Secondary | ICD-10-CM | POA: Diagnosis not present

## 2018-07-04 DIAGNOSIS — M25562 Pain in left knee: Secondary | ICD-10-CM | POA: Diagnosis not present

## 2018-07-04 DIAGNOSIS — E7800 Pure hypercholesterolemia, unspecified: Secondary | ICD-10-CM | POA: Diagnosis not present

## 2018-07-04 MED ORDER — DICLOFENAC SODIUM 1 % TD GEL: 2.0000 g | Freq: Three times a day (TID) | TRANSDERMAL | 0 refills | Status: DC | PRN

## 2018-07-04 MED ORDER — ATORVASTATIN CALCIUM 40 MG PO TABS: 40.0000 mg | ORAL_TABLET | Freq: Every day | ORAL | 3 refills | Status: DC

## 2018-07-04 MED ORDER — TRAMADOL HCL 50 MG PO TABS: 50.0000 mg | ORAL_TABLET | Freq: Four times a day (QID) | ORAL | 0 refills | Status: DC | PRN

## 2018-07-04 MED ORDER — DICLOFENAC SODIUM 1 % TD GEL
2.0000 g | Freq: Three times a day (TID) | TRANSDERMAL | 0 refills | Status: DC | PRN
Start: 2018-07-04 — End: 2019-11-19

## 2018-07-04 NOTE — Assessment & Plan Note (Addendum)
NSAIDs are contraindicated due to CKD.  Opioids are not preferred for chronic pain management in addition to patient's history of substance use disorder.  He was given a short course of tramadol (12 pills of 50 mg) in addition to refill his Voltaren gel.  He was told to use the tramadol only for flares of knee pain and that this is not an ideal medication for chronic pain management.  He plans to follow-up with his orthopedist later this week for steroid injection. -Continue Tylenol -Tramadol 50 mg (12 pills) -Voltaren Gel -F/u with ortho for knee injection -referral to Physical Therapy

## 2018-07-04 NOTE — Patient Instructions (Signed)
It was good to see you today. Here's a quick summary of what we talked about for your knee pain:  1) you can use the Voltaren gel to help with the knee pain, this will probably be the most helpful thing you can do.  2) I have prescribed a small amount of tramadol for worsening knee pain. Try to only use this when your pain is getting worse.  This is not a good maintenance medication.  3) Follow up with Dr. Theda Sers for your knee injection.  If you cannot get injections from Dr. Theda Sers, we can do that in the clinic here.

## 2018-07-04 NOTE — Progress Notes (Signed)
    Subjective:  [name] is a 67 y.o. male who presents to the [facility] today with a chief complaint of left knee pain.   HPI: Chronic Left Knee Pain: [name] has a history of chronic left knee pain.  He reports that this dates back to [employer] service years ago.  He has been seen multiple times in the clinic for this knee pain and also sees an orthopedist for management of his knee pain.  He is coming today due to increased knee pain.  He notes for the past 1 to 2 months that he has had significantly more pain in his knee.  This pain seems to worsen throughout the day and feels similar to his previous knee pain.  The knee pain is similar in character to his previous knee pain although worse in intensity.  He reports that he has used Voltaren gel in the past and found that helpful in addition to tramadol which is also been helpful.  He plans to see his orthopedist later this week at which time he plans to get a steroid injection.   Objective:  Physical Exam: BP 100/65 (BP Location: Right Arm, Patient Position: Sitting, Cuff Size: Normal)   Pulse 66   Temp 97.6 F (36.4 C) (Oral)   Ht 5\' 9"  (1.753 m)   Wt 165 lb 3.2 oz (74.9 kg)   SpO2 100%   BMI 24.40 kg/m    Gen: NAD, resting comfortably MSK: no pain with palpation of the knees bilaterally, patella nontender to palpation.  No antalgic gait.  Reproducible pain with McMurray's maneuver of the left knee, no pain elicited with the right knee.  No results found for this or any previous visit (from the past 72 hour(s)).   Assessment/Plan:  Chronic pain of left knee NSAIDs are contraindicated due to CKD.  Opioids are not preferred for chronic pain management in addition to patient's history of substance use disorder.  He was given a short course of tramadol (12 pills of 50 mg) in addition to refill his Voltaren gel.  He was told to use the tramadol only for flares of knee pain and that this is not an ideal medication for chronic pain  management.  He plans to follow-up with his orthopedist later this week for steroid injection. -Continue Tylenol -Tramadol 50 mg (12 pills) -Voltaren Gel -F/u with ortho for knee injection -referral to Physical Therapy

## 2018-07-07 DIAGNOSIS — F209 Schizophrenia, unspecified: Secondary | ICD-10-CM | POA: Diagnosis not present

## 2018-07-19 DIAGNOSIS — F209 Schizophrenia, unspecified: Secondary | ICD-10-CM | POA: Diagnosis not present

## 2018-08-02 ENCOUNTER — Encounter: Payer: Self-pay | Admitting: Family Medicine

## 2018-08-02 ENCOUNTER — Ambulatory Visit (INDEPENDENT_AMBULATORY_CARE_PROVIDER_SITE_OTHER): Payer: Medicare Other | Admitting: Family Medicine

## 2018-08-02 VITALS — BP 100/70 | HR 77 | Temp 98.0°F | Wt 166.8 lb

## 2018-08-02 DIAGNOSIS — I9589 Other hypotension: Secondary | ICD-10-CM

## 2018-08-02 DIAGNOSIS — Z0000 Encounter for general adult medical examination without abnormal findings: Secondary | ICD-10-CM | POA: Diagnosis not present

## 2018-08-02 DIAGNOSIS — F209 Schizophrenia, unspecified: Secondary | ICD-10-CM | POA: Diagnosis not present

## 2018-08-02 DIAGNOSIS — R351 Nocturia: Secondary | ICD-10-CM | POA: Diagnosis not present

## 2018-08-02 DIAGNOSIS — I959 Hypotension, unspecified: Secondary | ICD-10-CM | POA: Insufficient documentation

## 2018-08-02 LAB — POCT URINALYSIS DIP (MANUAL ENTRY)
Bilirubin, UA: NEGATIVE
Blood, UA: NEGATIVE
Glucose, UA: NEGATIVE mg/dL
Ketones, POC UA: NEGATIVE mg/dL
Leukocytes, UA: NEGATIVE
Nitrite, UA: NEGATIVE
Protein Ur, POC: NEGATIVE mg/dL
Spec Grav, UA: 1.01 (ref 1.010–1.025)
Urobilinogen, UA: 0.2 E.U./dL
pH, UA: 6.5 (ref 5.0–8.0)

## 2018-08-02 LAB — GLUCOSE, POCT (MANUAL RESULT ENTRY): POC Glucose: 92 mg/dl (ref 70–99)

## 2018-08-02 NOTE — Assessment & Plan Note (Addendum)
Discussed in length the diagnosis of diabetes and the causes.  Explained to patient that diabetes is not contagious and he cannot obtain diabetes from being married to his wife for several years.  Will obtain urine sample today to ensure no urinary infection as patient is having polyuria.  UA negative. CBG today within normal limits.  Unlikely that patient has diabetes.  Follow-up as needed.

## 2018-08-02 NOTE — Progress Notes (Addendum)
   Subjective:    Patient ID: [name], male    DOB: [birth year], 67 y.o.   MRN: [id number]   CC: check for diabetes  HPI: Check for diabetes Patient presenting today for concern of having diabetes.  States that he "has been with his wife for a long time" which is why he thinks he has diabetes.  States that he was married in 1990 and so he likely has had exposure to diabetes.  Does report polyuria, polydipsia, and increased hunger.  No weight changes.  Mother and maternal grandmother have history of diabetes.  Has a well-balanced diet with no sweets.  Does exercise every other day.  Objective:  BP 100/70   Pulse 77   Temp 98 F (36.7 C) (Oral)   Wt 166 lb 12.8 oz (75.7 kg)   SpO2 100%   BMI 24.63 kg/m  Vitals and nursing note reviewed  General: well nourished, in no acute distress HEENT: normocephalic, moist mucous membranes  Cardiac: Regular rhythm  Respiratory: no increased work of breathing Extremities: no edema or cyanosis. Warm, well perfused.   Skin: warm and dry, no rashes noted Neuro: alert and oriented, no focal deficits   Assessment & Plan:    Healthcare maintenance Discussed in length the diagnosis of diabetes and the causes.  Explained to patient that diabetes is not contagious and he cannot obtain diabetes from being married to his wife for several years.  Will obtain urine sample today to ensure no urinary infection as patient is having polyuria.  UA negative. CBG today within normal limits.  Unlikely that patient has diabetes.  Follow-up as needed.  Hypotension During visit today patient's blood pressure was low at 100/70. Per chart review at last visit on 11/18 blood pressure was also similar in 100/65.  Prior to this patient has had normotensive blood pressure readings.  Patient is asymptomatic.  States at home his blood pressure was 178/118.  Unclear why blood pressure is so low at this time.  Patient likely has labile blood pressures.  Have recommended that  he follow-up in 1 week with Dr. Valentina Lucks for ambulatory blood pressure check.  Advised patient to check blood pressure at home daily for 1 week and bring those readings into the appointment.  Likely patient is asymptomatic.  Patient also with elevated blood pressure readings at home.  Follow-up in 1 week.  Strict return precautions given.   Discussed patient with Dr. Nori Riis   Return in about 1 week (around 08/09/2018) for Dr. Valentina Lucks ambulatory blood pressure.   [name], DO, PGY-2

## 2018-08-02 NOTE — Patient Instructions (Signed)
  Blood Pressure Record Sheet Your blood pressure on this visit to the emergency department or clinic is elevated. This does not necessarily mean you have high blood pressure (hypertension), but it does mean that your blood pressure needs to be rechecked. Many times your blood pressure can increase due to illness, pain, anxiety, or other factors. We recommend that you get a series of blood pressure readings done over a period of 5 days. It is best to get a reading in the morning and one in the evening. You should make sure to sit and relax for 1-5 minutes before the reading is taken. Write the readings down and make a follow-up appointment with your health care provider to discuss the results. If there is not a free clinic or a drug store with a blood-pressure-taking machine near you, you can purchase blood-pressure-taking equipment from a drug store. Having one in the home allows you the convenience of taking your blood pressure while you are home and relaxed. Blood Pressure Log Date: _______________________  a.m. _____________________  p.m. _____________________  Date: _______________________  a.m. _____________________  p.m. _____________________  Date: _______________________  a.m. _____________________  p.m. _____________________  Date: _______________________  a.m. _____________________  p.m. _____________________  Date: _______________________  a.m. _____________________  p.m. _____________________  This information is not intended to replace advice given to you by your health care provider. Make sure you discuss any questions you have with your health care provider. Document Released: 05/02/2003 Document Revised: 07/17/2016 Document Reviewed: 09/26/2013 Elsevier Interactive Patient Education  [employer].   It was a pleasure seeing you today.   Today we discussed your blood pressure and diabetes  For your diabetes: it is not a contagious disease. I have  checked your urine and sugar today. I will either call or send a letter with this results.   For your blood pressure: check your blood pressure daily. Come back in in 1 week to see Dr. Valentina Lucks for ambulatory blood pressure check   Please follow up in 1 week or sooner if symptoms persist or worsen. Please call the clinic immediately if you have any concerns.   Our clinic's number is [phone]. Please call with questions or concerns.   Please go to the emergency room if you have dizziness or lightheadness   Thank you,  [name], DO

## 2018-08-02 NOTE — Assessment & Plan Note (Addendum)
During visit today patient's blood pressure was low at 100/70. Per chart review at last visit on 11/18 blood pressure was also similar in 100/65.  Prior to this patient has had normotensive blood pressure readings.  Patient is asymptomatic.  States at home his blood pressure was 178/118.  Unclear why blood pressure is so low at this time.  Patient likely has labile blood pressures.  Have recommended that he follow-up in 1 week with Dr. Valentina Lucks for ambulatory blood pressure check.  Advised patient to check blood pressure at home daily for 1 week and bring those readings into the appointment.  Likely patient is asymptomatic.  Patient also with elevated blood pressure readings at home.  Follow-up in 1 week.  Strict return precautions given.   Discussed patient with Dr. Nori Riis

## 2018-08-15 DIAGNOSIS — F209 Schizophrenia, unspecified: Secondary | ICD-10-CM | POA: Diagnosis not present

## 2018-08-22 DIAGNOSIS — F250 Schizoaffective disorder, bipolar type: Secondary | ICD-10-CM | POA: Diagnosis not present

## 2018-09-12 DIAGNOSIS — K7469 Other cirrhosis of liver: Secondary | ICD-10-CM | POA: Diagnosis not present

## 2018-09-13 ENCOUNTER — Other Ambulatory Visit: Payer: Self-pay | Admitting: Nurse Practitioner

## 2018-09-13 DIAGNOSIS — K7469 Other cirrhosis of liver: Secondary | ICD-10-CM

## 2018-09-29 DIAGNOSIS — F250 Schizoaffective disorder, bipolar type: Secondary | ICD-10-CM | POA: Diagnosis not present

## 2018-10-12 DIAGNOSIS — F250 Schizoaffective disorder, bipolar type: Secondary | ICD-10-CM | POA: Diagnosis not present

## 2018-10-26 DIAGNOSIS — F250 Schizoaffective disorder, bipolar type: Secondary | ICD-10-CM | POA: Diagnosis not present

## 2018-11-08 ENCOUNTER — Ambulatory Visit
Admission: RE | Admit: 2018-11-08 | Discharge: 2018-11-08 | Disposition: A | Discharge status: Home / Self Care | Payer: Medicare Other | Source: Ambulatory Visit | Attending: Nurse Practitioner | Admitting: Nurse Practitioner

## 2018-11-08 ENCOUNTER — Ambulatory Visit (HOSPITAL_COMMUNITY)
Admission: EM | Admit: 2018-11-08 | Discharge: 2018-11-08 | Disposition: A | Discharge status: Home / Self Care | Payer: Medicare Other

## 2018-11-08 ENCOUNTER — Other Ambulatory Visit: Payer: Self-pay

## 2018-11-08 DIAGNOSIS — K7469 Other cirrhosis of liver: Secondary | ICD-10-CM | POA: Diagnosis not present

## 2018-11-08 NOTE — ED Notes (Signed)
Patient left before registration.

## 2018-11-10 DIAGNOSIS — F250 Schizoaffective disorder, bipolar type: Secondary | ICD-10-CM | POA: Diagnosis not present

## 2018-11-14 ENCOUNTER — Telehealth: Payer: Self-pay | Admitting: Family Medicine

## 2018-11-14 NOTE — Telephone Encounter (Signed)
Pt would like an appt to check his liver enzymes. Pt seemed a bit all over the place when he was talking. Please call pt back at [phone] before 2:00 in the day. His number in his chart is not working at the moment.

## 2018-11-15 NOTE — Telephone Encounter (Signed)
Noted and agree.  I was preceptor.

## 2018-11-15 NOTE — Telephone Encounter (Signed)
Attempted to call patient back. No answer, LVM with call back number for clinic.   Zettie Cooley, M.D.  Family Medicine  PGY-1 11/15/2018 9:08 AM

## 2018-11-15 NOTE — Telephone Encounter (Signed)
[facility] Telephone Call   Encounter participants: Patient: [name]  Provider: Wilber Oliphant, MD  PCP: [name], DO   CC: " I need my liver tested" HPI:  [name] is a 68 y.o. male with pmhx of schizophrenia (followed at [facility]), who is calling today to schedule an appointment to get his liver enzymes tested.  He reports that he would like to be seen as soon as possible.  I explained to the patient that we are currently trying to limit exposure in the setting of COVID 19 outbreak, as a result, we are not seeing patients who do not have any symptoms.  He does not complain of any symptoms at this time.  He repeats, he just needs to have his liver tested.  He reports that he follows at [facility] on [address], but is unsure of his doctor.  He reports that he is on the list for transplant.  He also reports that he drank over the weekend, on Friday and Saturday.  He had one shot and a couple glasses of Chardonnay. I do not see any recent LFTs on his chart, most recent LFTs are in 2018 and appear within normal limits.  He is found intoxicated on the phone. Patient speech is loud, sounds slurred, fast with decreased latency. He is cooperative, friendly. His thought process is goal-directed, logical, however does tangent.   As patient does not have any acute symptoms at this time (no fever, cough, nausea, vomiting, abdominal pain), I do not believe that this patient needs to be seen in clinic.  Patient is directed to call the liver clinic for liver enzyme testing if he is followed there.  Patient agreeable.  He also notes that he would like to see Dr. Tammi Klippel for checkup as soon as we are taking appointment again.  I encouraged him to call back in about a month.  Zettie Cooley, M.D. [place]  PGY -1 11/15/2018, 3:07 PM

## 2018-11-24 DIAGNOSIS — F250 Schizoaffective disorder, bipolar type: Secondary | ICD-10-CM | POA: Diagnosis not present

## 2018-11-28 ENCOUNTER — Telehealth (INDEPENDENT_AMBULATORY_CARE_PROVIDER_SITE_OTHER): Payer: Medicare Other | Admitting: Family Medicine

## 2018-11-28 ENCOUNTER — Other Ambulatory Visit: Payer: Self-pay

## 2018-11-28 ENCOUNTER — Telehealth: Payer: Self-pay | Admitting: Family Medicine

## 2018-11-28 DIAGNOSIS — G8929 Other chronic pain: Secondary | ICD-10-CM

## 2018-11-28 DIAGNOSIS — M25562 Pain in left knee: Secondary | ICD-10-CM

## 2018-11-28 NOTE — Telephone Encounter (Addendum)
Patient scheduled for a telemedicine appointment today at 2:10 PM.  Called patient and left voicemail for him to call back.  Attempted again, no answer.

## 2018-11-28 NOTE — Progress Notes (Signed)
[facility] Telemedicine Visit  Patient consented to have virtual visit. Method of visit: Telephone  Encounter participants: Patient: [name] - located at home Provider: Cleophas Dunker - located at [facility] Others (if applicable): none  Chief Complaint: left knee pain  HPI:  Patient has chronic left knee pain for which he received knee injections by Dr. Theda Sers, orthopedist.  Patient states that it is been about 3 months since his last injection, and he is starting to have the same knee pain that he always has in that left knee.  He states that he would like another knee injection.  Patient also notes that he would like to make an appointment to see Dr. Tammi Klippel in about a month so that he can discuss his other medical problems with her.  He declined speaking about this over the phone right now.  ROS: per HPI  Pertinent PMHx: Chronic left knee pain  Exam:  Respiratory: Patient is speaking in complete sentences and not in any respiratory distress on the phone.  Assessment/Plan:  Chronic pain of left knee Likely secondary to osteoarthritis.  Advised patient that he should contact his orthopedist Dr. Theda Sers to see about obtaining another knee injection.  Patient voiced understanding of this and stated that he will call right away.  He states that he does not need anything else from [place].   Patient advised to call back in about 1 month to schedule an appointment with Dr. Tammi Klippel pending change in clinic policy to allow patients to be seen for nonemergent medical needs again.  Time spent during visit with patient: 13 minutes

## 2018-11-28 NOTE — Assessment & Plan Note (Signed)
Likely secondary to osteoarthritis.  Advised patient that he should contact his orthopedist Dr. Theda Sers to see about obtaining another knee injection.  Patient voiced understanding of this and stated that he will call right away.  He states that he does not need anything else from [place].

## 2018-12-05 ENCOUNTER — Other Ambulatory Visit: Payer: Self-pay | Admitting: Family Medicine

## 2018-12-07 DIAGNOSIS — F250 Schizoaffective disorder, bipolar type: Secondary | ICD-10-CM | POA: Diagnosis not present

## 2018-12-19 DIAGNOSIS — M25562 Pain in left knee: Secondary | ICD-10-CM | POA: Diagnosis not present

## 2018-12-20 DIAGNOSIS — F250 Schizoaffective disorder, bipolar type: Secondary | ICD-10-CM | POA: Diagnosis not present

## 2019-01-31 DIAGNOSIS — F250 Schizoaffective disorder, bipolar type: Secondary | ICD-10-CM | POA: Diagnosis not present

## 2019-02-14 DIAGNOSIS — F250 Schizoaffective disorder, bipolar type: Secondary | ICD-10-CM | POA: Diagnosis not present

## 2019-03-02 DIAGNOSIS — F250 Schizoaffective disorder, bipolar type: Secondary | ICD-10-CM | POA: Diagnosis not present

## 2019-03-07 DIAGNOSIS — K7469 Other cirrhosis of liver: Secondary | ICD-10-CM | POA: Diagnosis not present

## 2019-03-08 DIAGNOSIS — R4182 Altered mental status, unspecified: Secondary | ICD-10-CM | POA: Diagnosis not present

## 2019-03-08 DIAGNOSIS — K7469 Other cirrhosis of liver: Secondary | ICD-10-CM | POA: Diagnosis not present

## 2019-03-16 DIAGNOSIS — F250 Schizoaffective disorder, bipolar type: Secondary | ICD-10-CM | POA: Diagnosis not present

## 2019-04-15 ENCOUNTER — Other Ambulatory Visit: Payer: Self-pay | Admitting: Family Medicine

## 2019-04-15 DIAGNOSIS — N400 Enlarged prostate without lower urinary tract symptoms: Secondary | ICD-10-CM

## 2019-04-20 DIAGNOSIS — F250 Schizoaffective disorder, bipolar type: Secondary | ICD-10-CM | POA: Diagnosis not present

## 2019-05-01 NOTE — Progress Notes (Signed)
   Subjective:    Patient ID: [name], male    DOB: 07/20/1951, 68 y.o.   MRN: [id number]   CC: acid reflux, f/u HTN, HLD  HPI: GERD Patient reports worsening acid reflux.  States that this only occurs when he eats hot dogs.  States that he is using Pepcid and it works really well, just does not work if he eats hotdogs.  Is using Pepcid twice a day.  Hypertension: - Medications: norvasc 10mg , losartan 100mg   - Compliance: good  - Checking BP at home: usually 120/80 at home  - Denies any SOB, CP, vision changes, LE edema, medication SEs, or symptoms of hypotension - Diet: not high salt or red meat  - Exercise: walks daily   Hyperlipidemia Meds: atorvastatin 40mg   Diet: see above Exercise: see above  The ASCVD Risk score Mikey Bussing DC Jr., et al., 2013) failed to calculate for the following reasons:   The valid total cholesterol range is 130 to 320 mg/dL  Hepatitis C Followed by hepatology. Last follow up "was a long time ago".    Objective:  BP 98/62   Pulse 62   SpO2 99%  Vitals and nursing note reviewed  General: well nourished, in no acute distress HEENT: normocephalic, no scleral icterus  Neck: supple  Cardiac: RRR, clear S1 and S2, no murmurs, rubs, or gallops Respiratory: clear to auscultation bilaterally, no increased work of breathing Abdomen: soft, nontender, nondistended, no masses or organomegaly. Bowel sounds present Extremities: no edema or cyanosis. Warm, well perfused.  Skin: warm and dry, no rashes noted Neuro: alert and oriented, no focal deficits   Assessment & Plan:    GERD (gastroesophageal reflux disease) Patient to continue taking famotidine.  Diet instructions for avoiding worsening of symptoms of acid reflux given in patient's after visit summary.  Advised to avoid hotdogs if this is exacerbating his condition.  Strict return precautions given.  Abdominal exam was benign.  Follow-up if no improvement.  HYPERTENSION, BENIGN ESSENTIAL Patient  is hypotensive today, but asymptomatic.  Reports that he has normotensive blood pressures at home.  Given that he is normotensive at home we will plan on continuing current medication regimen.  Advised that patient follow-up with Dr. Valentina Lucks for ambulatory blood pressure monitoring so we can ensure that he is not hypotensive at home.  If he is hypotensive at home can consider decreasing medications at that time.  Follow-up in 1 month.  Hyperlipidemia Lipid panel ordered today.  Continue atorvastatin 40 mg.  HEPATITIS C Has not followed with hepatology in some time.  Have placed referral so that he can be reestablished. CMP ordered today.     Return in about 1 week (around 05/09/2019) for Dr. Valentina Lucks ambulatory blood pressure monitoring.   [name], DO, PGY-3

## 2019-05-02 ENCOUNTER — Ambulatory Visit (INDEPENDENT_AMBULATORY_CARE_PROVIDER_SITE_OTHER): Payer: Medicare Other | Admitting: Family Medicine

## 2019-05-02 ENCOUNTER — Other Ambulatory Visit: Payer: Self-pay

## 2019-05-02 ENCOUNTER — Encounter: Payer: Self-pay | Admitting: Family Medicine

## 2019-05-02 VITALS — BP 98/62 | HR 62

## 2019-05-02 DIAGNOSIS — K219 Gastro-esophageal reflux disease without esophagitis: Secondary | ICD-10-CM | POA: Diagnosis not present

## 2019-05-02 DIAGNOSIS — Z23 Encounter for immunization: Secondary | ICD-10-CM | POA: Diagnosis not present

## 2019-05-02 DIAGNOSIS — E785 Hyperlipidemia, unspecified: Secondary | ICD-10-CM | POA: Diagnosis not present

## 2019-05-02 DIAGNOSIS — I10 Essential (primary) hypertension: Secondary | ICD-10-CM

## 2019-05-02 DIAGNOSIS — B182 Chronic viral hepatitis C: Secondary | ICD-10-CM

## 2019-05-02 DIAGNOSIS — B1710 Acute hepatitis C without hepatic coma: Secondary | ICD-10-CM

## 2019-05-02 MED ORDER — FAMOTIDINE 20 MG PO TABS: 20.0000 mg | ORAL_TABLET | Freq: Two times a day (BID) | ORAL | 3 refills | Status: DC

## 2019-05-02 NOTE — Patient Instructions (Signed)
Food Choices for Gastroesophageal Reflux Disease, Adult When you have gastroesophageal reflux disease (GERD), the foods you eat and your eating habits are very important. Choosing the right foods can help ease your discomfort. Think about working with a nutrition specialist (dietitian) to help you make good choices. What are tips for following this plan?  Meals  Choose healthy foods that are low in fat, such as fruits, vegetables, whole grains, low-fat dairy products, and lean meat, fish, and poultry.  Eat small meals often instead of 3 large meals a day. Eat your meals slowly, and in a place where you are relaxed. Avoid bending over or lying down until 2-3 hours after eating.  Avoid eating meals 2-3 hours before bed.  Avoid drinking a lot of liquid with meals.  Cook foods using methods other than frying. Bake, grill, or broil food instead.  Avoid or limit: ? Chocolate. ? Peppermint or spearmint. ? Alcohol. ? Pepper. ? Black and decaffeinated coffee. ? Black and decaffeinated tea. ? Bubbly (carbonated) soft drinks. ? Caffeinated energy drinks and soft drinks.  Limit high-fat foods such as: ? Fatty meat or fried foods. ? Whole milk, cream, butter, or ice cream. ? Nuts and nut butters. ? Pastries, donuts, and sweets made with butter or shortening.  Avoid foods that cause symptoms. These foods may be different for everyone. Common foods that cause symptoms include: ? Tomatoes. ? Oranges, lemons, and limes. ? Peppers. ? Spicy food. ? Onions and garlic. ? Vinegar. Lifestyle  Maintain a healthy weight. Ask your doctor what weight is healthy for you. If you need to lose weight, work with your doctor to do so safely.  Exercise for at least 30 minutes for 5 or more days each week, or as told by your doctor.  Wear loose-fitting clothes.  Do not smoke. If you need help quitting, ask your doctor.  Sleep with the head of your bed higher than your feet. Use a wedge under the  mattress or blocks under the bed frame to raise the head of the bed. Summary  When you have gastroesophageal reflux disease (GERD), food and lifestyle choices are very important in easing your symptoms.  Eat small meals often instead of 3 large meals a day. Eat your meals slowly, and in a place where you are relaxed.  Limit high-fat foods such as fatty meat or fried foods.  Avoid bending over or lying down until 2-3 hours after eating.  Avoid peppermint and spearmint, caffeine, alcohol, and chocolate. This information is not intended to replace advice given to you by your health care provider. Make sure you discuss any questions you have with your health care provider. Document Released: 02/02/2012 Document Revised: 11/24/2018 Document Reviewed: 09/08/2016 Elsevier Patient Education  [place].   Please follow up in 1 week for ambulatory blood pressure monitoring with Dr. Valentina Lucks

## 2019-05-03 ENCOUNTER — Telehealth: Payer: Self-pay

## 2019-05-03 DIAGNOSIS — K219 Gastro-esophageal reflux disease without esophagitis: Secondary | ICD-10-CM | POA: Insufficient documentation

## 2019-05-03 LAB — COMPREHENSIVE METABOLIC PANEL
ALT: 38 IU/L (ref 0–44)
AST: 32 IU/L (ref 0–40)
Albumin/Globulin Ratio: 2.2 (ref 1.2–2.2)
Albumin: 4.6 g/dL (ref 3.8–4.8)
Alkaline Phosphatase: 55 IU/L (ref 39–117)
BUN/Creatinine Ratio: 16 (ref 10–24)
BUN: 19 mg/dL (ref 8–27)
Bilirubin Total: 0.3 mg/dL (ref 0.0–1.2)
CO2: 22 mmol/L (ref 20–29)
Calcium: 9 mg/dL (ref 8.6–10.2)
Chloride: 99 mmol/L (ref 96–106)
Creatinine, Ser: 1.21 mg/dL (ref 0.76–1.27)
GFR calc Af Amer: 71 mL/min/{1.73_m2} (ref >59)
GFR calc non Af Amer: 62 mL/min/{1.73_m2} (ref >59)
Globulin, Total: 2.1 g/dL (ref 1.5–4.5)
Glucose: 71 mg/dL (ref 65–99)
Potassium: 4.7 mmol/L (ref 3.5–5.2)
Sodium: 134 mmol/L (ref 134–144)
Total Protein: 6.7 g/dL (ref 6.0–8.5)

## 2019-05-03 LAB — LIPID PANEL
Chol/HDL Ratio: 1.9 ratio (ref 0.0–5.0)
Cholesterol, Total: 100 mg/dL (ref 100–199)
HDL: 54 mg/dL (ref >39)
LDL Chol Calc (NIH): 37 mg/dL (ref 0–99)
Triglycerides: 22 mg/dL (ref 0–149)
VLDL Cholesterol Cal: 9 mg/dL (ref 5–40)

## 2019-05-03 NOTE — Telephone Encounter (Signed)
LVM for patient to call office.  Had generic voicemail.  Ozella Almond, CMA'

## 2019-05-03 NOTE — Assessment & Plan Note (Signed)
Patient is hypotensive today, but asymptomatic.  Reports that he has normotensive blood pressures at home.  Given that he is normotensive at home we will plan on continuing current medication regimen.  Advised that patient follow-up with Dr. Valentina Lucks for ambulatory blood pressure monitoring so we can ensure that he is not hypotensive at home.  If he is hypotensive at home can consider decreasing medications at that time.  Follow-up in 1 month.

## 2019-05-03 NOTE — Assessment & Plan Note (Addendum)
Has not followed with hepatology in some time.  Have placed referral so that he can be reestablished. CMP ordered today.

## 2019-05-03 NOTE — Assessment & Plan Note (Signed)
Patient to continue taking famotidine.  Diet instructions for avoiding worsening of symptoms of acid reflux given in patient's after visit summary.  Advised to avoid hotdogs if this is exacerbating his condition.  Strict return precautions given.  Abdominal exam was benign.  Follow-up if no improvement.

## 2019-05-03 NOTE — Assessment & Plan Note (Signed)
Lipid panel ordered today.  Continue atorvastatin 40 mg.

## 2019-05-04 DIAGNOSIS — F250 Schizoaffective disorder, bipolar type: Secondary | ICD-10-CM | POA: Diagnosis not present

## 2019-05-09 ENCOUNTER — Ambulatory Visit: Payer: Medicare Other | Admitting: Pharmacist

## 2019-05-11 ENCOUNTER — Ambulatory Visit: Payer: Medicare Other | Admitting: Pharmacist

## 2019-05-25 ENCOUNTER — Ambulatory Visit: Payer: Medicare Other | Admitting: Pharmacist

## 2019-06-01 DIAGNOSIS — F250 Schizoaffective disorder, bipolar type: Secondary | ICD-10-CM | POA: Diagnosis not present

## 2019-06-15 DIAGNOSIS — F250 Schizoaffective disorder, bipolar type: Secondary | ICD-10-CM | POA: Diagnosis not present

## 2019-06-29 DIAGNOSIS — F250 Schizoaffective disorder, bipolar type: Secondary | ICD-10-CM | POA: Diagnosis not present

## 2019-08-15 ENCOUNTER — Other Ambulatory Visit: Payer: Self-pay | Admitting: *Deleted

## 2019-08-15 DIAGNOSIS — E7800 Pure hypercholesterolemia, unspecified: Secondary | ICD-10-CM

## 2019-08-15 MED ORDER — ATORVASTATIN CALCIUM 40 MG PO TABS: 40.0000 mg | ORAL_TABLET | Freq: Every day | ORAL | 3 refills | Status: DC

## 2019-08-15 NOTE — Progress Notes (Deleted)
Subjective:  CC -- Annual Physical; With complaints of ***  Pt reports he ***   [facility]: Medication Compliance: {YES/NO/WILD CARDS:18581}  Cardiac Risk as of ***(date): *** (assessment every 3-5 years)*** Aspirin: {YES/NO/WILD CARDS:18581} (75yr CAD risk >3%)*** Dx Hypertension: {YES/NO/WILD NF:3112392; Medications: norvasc 10mg , losartan 100mg   Dx Hyperlipidemia: {YES/NO/WILD NF:3112392; Medications: atorvastatin 40mg    Diabetes: {YES/NO/WILD CARDS:18581} (screen until 67yo w/ BMI >25 or HTN/HLD)***(A1c >6.5, or classic Sxs w/ random CBG >200, or FBG >126 (fasting >8hr))*** Dx Obesity: {YES/NO/WILD CARDS:18581} (Class I BMI <34.9, Class II <39.9, Class III < 49.9)*** Weight Loss: {YES/NO/WILD CARDS:18581} (loss >10% needs further assessment of undernutrition/medical-cause/pharm-cause/food security)*** Physical Activity: {YES/NO/WILD NF:3112392  Urinary Incontinence/Retention: {YES/NO/WILD NF:3112392 (yes=med review, GU exam, blood/urine labs)***  Social: Driving: {YES/NO/WILD [id number]:3112392 (vision, mobility, cognition >> refer for driving assessment PRN)*** Alcohol Use: {YES/NO/WILD NF:3112392  Tobacco Use: {YES/NO/WILD NF:3112392   - Interested in Quitting: {YES/NO/WILD NF:3112392  Other Drugs: {YES/NO/WILD CARDS:18581}  Independence: *** Depression: {YES/NO/WILD CARDS:18581}   - PHQ9 score:  Support and Life at Home: {YES/NO/WILD CARDS:18581} (signs of neglect?)*** Advanced Directives: {YES/NO/WILD [id number]:3112392   Cancer: (Factor life expectancy & benefits vs harms)*** Colorectal >> Colonoscopy: {YES/NO/WILD NF:3112392  Lung >> Tobacco Use: {YES/NO/WILD CARDS:18581} (screen until 67yo w/ >44yr/pack/yr Hx, unless quit >23yr ago) ***  - If so, previous Low-Dose CT screen: {YES/NO/WILD NF:3112392  Prostate >> Interested in DRE and/or PSA: {YES/NO/WILD CARDS:18581} (to be discussed w/ patient until age 1)*** Skin >> Suspicious lesions: {YES/NO/WILD  NF:3112392   Other: Osteoporosis: {YES/NO/WILD CARDS:18581} (men w/ clinical manifestations of low bone mass)*** AAA Screen: {YES/NO/WILD CARDS:18581} (screen men 65-75yo w/ fam Hx or any smoking Hx; once)*** Zoster Vaccine: {YES/NO/WILD CARDS:18581} (those >50yo, once)*** Pneumonia Vaccine: {YES/NO/WILD CARDS:18581} (PPSV13 then PPSV23 in 6-12mths; repeat PPSV23 once if pt received prior to 67yo and 51yrs have passed)*** Flu Vaccine: {YES/NO/WILD NF:3112392    ROS-  Past Medical History Patient Active Problem List   Diagnosis Date Noted  . GERD (gastroesophageal reflux disease) 05/03/2019  . Hypotension 08/02/2018  . Low back sprain, initial encounter 10/21/2017  . History of drug abuse ([place]) 10/18/2017  . Gingival erythema 09/23/2017  . Chronic pain of left knee 11/24/2016  . Tobacco abuse   . Diastolic heart failure ([place]) 02/05/2015  . Healthcare maintenance 05/28/2014  . Hyperlipidemia 03/10/2014  . Patellar tendonitis 11/16/2012  . [place] OCCLUSION 06/03/2010  . PARANOID SCHIZOPHRENIA, CHRONIC 01/17/2009  . HYPERTENSION, BENIGN ESSENTIAL 01/17/2009  . BPH (benign prostatic hyperplasia) 01/17/2009  . HEPATITIS C 12/14/2008  . DEPRESSION 12/14/2008    Medications- reviewed and updated Current Outpatient Medications  Medication Sig Dispense Refill  . acetaminophen (TYLENOL) 500 MG tablet Take 1 tablet (500 mg total) by mouth every 6 (six) hours as needed. 30 tablet 0  . amLODipine (NORVASC) 10 MG tablet Take 1 tablet (10 mg total) by mouth daily. 30 tablet 5  . aspirin 81 MG tablet Take 1 tablet (81 mg total) by mouth daily. 30 tablet 6  . atorvastatin (LIPITOR) 40 MG tablet Take 1 tablet (40 mg total) by mouth daily. 90 tablet 3  . azithromycin (ZITHROMAX) 250 MG tablet Take 1 tablet (250 mg total) by mouth daily. 4 tablet 0  . benztropine (COGENTIN) 1 MG tablet Take 1 tablet (1 mg total) by mouth 2 (two) times daily. 60 tablet 0  . cetirizine (ZYRTEC) 10  MG tablet take 1 tablet by mouth once daily 30 tablet 6  . diclofenac sodium (VOLTAREN) 1 % GEL Apply  2 g topically 3 (three) times daily as needed. 1 Tube 0  . famotidine (PEPCID) 20 MG tablet Take 1 tablet (20 mg total) by mouth 2 (two) times daily. 60 tablet 3  . fluPHENAZine decanoate (PROLIXIN) 25 MG/ML injection Inject 25 mg into the muscle every 14 (fourteen) days. Last dose 09/07/12    . fluticasone (FLONASE) 50 MCG/ACT nasal spray Place 1 spray into both nostrils daily. 16 g 0  . loratadine (CLARITIN) 10 MG tablet Take 1 tablet (10 mg total) by mouth daily. 5 tablet 0  . losartan (COZAAR) 100 MG tablet TAKE 1 TABLET BY MOUTH EVERY DAY 90 tablet 3  . Multiple Vitamin (MULTIVITAMIN WITH MINERALS) TABS tablet Take 1 tablet by mouth daily. 30 tablet 6  . tamsulosin (FLOMAX) 0.4 MG CAPS capsule Take 2 capsules (0.8 mg total) by mouth daily. 60 capsule 0  . tamsulosin (FLOMAX) 0.4 MG CAPS capsule TAKE 2 CAPSULE BY MOUTH DAILY 180 capsule 3  . traMADol (ULTRAM) 50 MG tablet Take 1 tablet (50 mg total) by mouth every 6 (six) hours as needed. 12 tablet 0  . triamcinolone cream (KENALOG) 0.1 % Apply 1 application topically 2 (two) times daily. For 5 days, then twice daily as needed for rash and itching. 30 g 0  . vitamin C (ASCORBIC ACID) 500 MG tablet Take 500 mg by mouth daily.     No current facility-administered medications for this visit.    Objective: There were no vitals taken for this visit. Gen: NAD, alert, cooperative with exam*** HEENT: NCAT, EOMI, PERRL CV: RRR, good S1/S2, no murmur Resp: CTABL, no wheezes, non-labored Abd: Soft, Non Tender, Non Distended, BS present, no guarding or organomegaly Genital Exam: {male genital:311510::"not done"} Ext: No edema, warm Neuro: Alert and oriented, No gross deficits   Assessment/Plan:  No problem-specific Assessment & Plan notes found for this encounter.   No orders of the defined types were placed in this encounter.   No orders  of the defined types were placed in this encounter.    [name], DO, PGY-3 08/15/2019 12:48 PM

## 2019-08-16 ENCOUNTER — Ambulatory Visit: Payer: Medicare Other | Admitting: Family Medicine

## 2019-11-01 ENCOUNTER — Emergency Department (HOSPITAL_COMMUNITY)
Admission: EM | Admit: 2019-11-01 | Discharge: 2019-11-02 | Disposition: A | Discharge status: Home / Self Care | Payer: Medicare Other | Attending: Emergency Medicine | Admitting: Emergency Medicine

## 2019-11-01 ENCOUNTER — Encounter (HOSPITAL_COMMUNITY): Payer: Self-pay | Admitting: Emergency Medicine

## 2019-11-01 ENCOUNTER — Other Ambulatory Visit: Payer: Self-pay

## 2019-11-01 DIAGNOSIS — M25562 Pain in left knee: Secondary | ICD-10-CM | POA: Insufficient documentation

## 2019-11-01 DIAGNOSIS — Z5321 Procedure and treatment not carried out due to patient leaving prior to being seen by health care provider: Secondary | ICD-10-CM | POA: Diagnosis not present

## 2019-11-01 NOTE — ED Notes (Signed)
Now pts refusing to get in wheelchair and states he doesn't want to be seen in the ER.

## 2019-11-01 NOTE — ED Notes (Signed)
Unable to locate in waiting room

## 2019-11-01 NOTE — ED Notes (Signed)
Pt found in corner asleep. Pt advising he still wants to be seen.

## 2019-11-01 NOTE — ED Triage Notes (Signed)
Pt reports left knee pain, an old basketball injury.  Pt ambulatory, no other issues at this time.

## 2019-11-02 ENCOUNTER — Emergency Department (HOSPITAL_COMMUNITY)
Admission: EM | Admit: 2019-11-02 | Discharge: 2019-11-02 | Disposition: A | Discharge status: Home / Self Care | Payer: Medicare Other | Attending: Emergency Medicine | Admitting: Emergency Medicine

## 2019-11-02 ENCOUNTER — Encounter (HOSPITAL_COMMUNITY): Payer: Self-pay

## 2019-11-02 ENCOUNTER — Telehealth: Payer: Self-pay | Admitting: Family Medicine

## 2019-11-02 DIAGNOSIS — I5030 Unspecified diastolic (congestive) heart failure: Secondary | ICD-10-CM | POA: Diagnosis not present

## 2019-11-02 DIAGNOSIS — I110 Hypertensive heart disease with heart failure: Secondary | ICD-10-CM | POA: Diagnosis not present

## 2019-11-02 DIAGNOSIS — G8929 Other chronic pain: Secondary | ICD-10-CM | POA: Diagnosis not present

## 2019-11-02 DIAGNOSIS — F17210 Nicotine dependence, cigarettes, uncomplicated: Secondary | ICD-10-CM | POA: Insufficient documentation

## 2019-11-02 DIAGNOSIS — M25562 Pain in left knee: Secondary | ICD-10-CM | POA: Insufficient documentation

## 2019-11-02 DIAGNOSIS — Z79899 Other long term (current) drug therapy: Secondary | ICD-10-CM | POA: Diagnosis not present

## 2019-11-02 NOTE — Discharge Instructions (Addendum)
Can take tylenol or motrin for pain. Please follow-up with your primary care doctor. Return here for any new/acute changes.

## 2019-11-02 NOTE — ED Provider Notes (Signed)
[facility] EMERGENCY DEPARTMENT Provider Note   CSN: [id number] Arrival date & time: 11/02/19  0020     History Chief Complaint  Patient presents with  . Knee Pain    [name] is a 69 y.o. male.  The history is provided by the patient and medical records.    69 year old male with history of BPH, depression, GERD, hep C, paranoid schizophrenia, hypertension, polysubstance abuse, presenting to the ED with left knee pain.  He reports injury from the [employer] many years ago when he "fell on some blacktop".  States he has daily pain, worse when it is colder raining.  He denies any new injury, trauma, or falls.  No numbness or weakness of the legs.  He is not having any difficulty walking at present.  Past Medical History:  Diagnosis Date  . BPH (benign prostatic hyperplasia)   . Colon polyp 2010  . Depression   . GERD (gastroesophageal reflux disease)   . Hepatitis C    "caught it when I had a blood transfusion"  . High cholesterol   . History of blood transfusion    "when I was young"  . Hypertension   . Paranoid schizophrenia ([place])   . Prostate atrophy   . Retinal vein occlusion     Patient Active Problem List   Diagnosis Date Noted  . GERD (gastroesophageal reflux disease) 05/03/2019  . Hypotension 08/02/2018  . Low back sprain, initial encounter 10/21/2017  . History of drug abuse ([place]) 10/18/2017  . Gingival erythema 09/23/2017  . Chronic pain of left knee 11/24/2016  . Tobacco abuse   . Diastolic heart failure ([place]) 02/05/2015  . Healthcare maintenance 05/28/2014  . Hyperlipidemia 03/10/2014  . Patellar tendonitis 11/16/2012  . [place] OCCLUSION 06/03/2010  . PARANOID SCHIZOPHRENIA, CHRONIC 01/17/2009  . HYPERTENSION, BENIGN ESSENTIAL 01/17/2009  . BPH (benign prostatic hyperplasia) 01/17/2009  . HEPATITIS C 12/14/2008  . DEPRESSION 12/14/2008    Past Surgical History:  Procedure Laterality Date  . CIRCUMCISION  1959    . TONSILLECTOMY         Family History  Problem Relation Age of Onset  . Hypertension Mother   . Diabetes Mother   . Stroke Mother     Social History   Tobacco Use  . Smoking status: Current Every Day Smoker    Packs/day: 0.50    Years: 48.00    Pack years: 24.00    Types: Cigarettes  . Smokeless tobacco: Never Used  Substance Use Topics  . Alcohol use: No  . Drug use: No    Home Medications Prior to Admission medications   Medication Sig Start Date End Date Taking? Authorizing Provider  acetaminophen (TYLENOL) 500 MG tablet Take 1 tablet (500 mg total) by mouth every 6 (six) hours as needed. 04/23/18   [name], [name], PA-C  amLODipine (NORVASC) 10 MG tablet Take 1 tablet (10 mg total) by mouth daily. 04/13/18   [name], DO  aspirin 81 MG tablet Take 1 tablet (81 mg total) by mouth daily. 09/24/14   [name], MD  atorvastatin (LIPITOR) 40 MG tablet Take 1 tablet (40 mg total) by mouth daily. 08/15/19   [name], DO  azithromycin (ZITHROMAX) 250 MG tablet Take 1 tablet (250 mg total) by mouth daily. 01/17/17   Law, Bea Graff, PA-C  benztropine (COGENTIN) 1 MG tablet Take 1 tablet (1 mg total) by mouth 2 (two) times daily. 11/05/16   [name], MD  cetirizine (ZYRTEC) 10 MG tablet take 1 tablet by mouth once daily 02/20/15   [name], MD  diclofenac sodium (VOLTAREN) 1 % GEL Apply 2 g topically 3 (three) times daily as needed. 07/04/18   [name], MD  famotidine (PEPCID) 20 MG tablet Take 1 tablet (20 mg total) by mouth 2 (two) times daily. 05/02/19   [name], DO  fluPHENAZine decanoate (PROLIXIN) 25 MG/ML injection Inject 25 mg into the muscle every 14 (fourteen) days. Last dose 09/07/12    [provider]  fluticasone (FLONASE) 50 MCG/ACT nasal spray Place 1 spray into both nostrils daily. 11/07/16   Shary Decamp, PA-C  loratadine (CLARITIN) 10 MG tablet Take 1 tablet (10 mg total) by mouth daily. 11/09/17   Couture, Cortni  S, PA-C  losartan (COZAAR) 100 MG tablet TAKE 1 TABLET BY MOUTH EVERY DAY 12/06/18   [name], DO  Multiple Vitamin (MULTIVITAMIN WITH MINERALS) TABS tablet Take 1 tablet by mouth daily. 06/28/14   [name], MD  tamsulosin (FLOMAX) 0.4 MG CAPS capsule Take 2 capsules (0.8 mg total) by mouth daily. 11/27/17   [name], MD  tamsulosin (FLOMAX) 0.4 MG CAPS capsule TAKE 2 CAPSULE BY MOUTH DAILY 04/17/19   [name], DO  traMADol (ULTRAM) 50 MG tablet Take 1 tablet (50 mg total) by mouth every 6 (six) hours as needed. 07/04/18   [name], MD  triamcinolone cream (KENALOG) 0.1 % Apply 1 application topically 2 (two) times daily. For 5 days, then twice daily as needed for rash and itching. 01/03/15   [name], MD  vitamin C (ASCORBIC ACID) 500 MG tablet Take 500 mg by mouth daily.    [provider]    Allergies    Lisinopril, Penicillins, Amoxicillin, and Ibuprofen  Review of Systems   Review of Systems  Musculoskeletal: Positive for arthralgias.  All other systems reviewed and are negative.   Physical Exam Updated Vital Signs BP (!) 164/93 (BP Location: Right Arm)   Pulse 64   Temp 97.9 F (36.6 C) (Oral)   Resp 14   SpO2 100%   Physical Exam Vitals and nursing note reviewed.  Constitutional:      Appearance: He is well-developed.     Comments: Intermittently falling asleep during exam, appears under the influence  HENT:     Head: Normocephalic and atraumatic.  Eyes:     Conjunctiva/sclera: Conjunctivae normal.     Pupils: Pupils are equal, round, and reactive to light.  Cardiovascular:     Rate and Rhythm: Normal rate and regular rhythm.     Heart sounds: Normal heart sounds.  Pulmonary:     Effort: Pulmonary effort is normal.     Breath sounds: Normal breath sounds. No stridor. No wheezing.  Abdominal:     General: Bowel sounds are normal.     Palpations: Abdomen is soft.  Musculoskeletal:        General: Normal  range of motion.     Cervical back: Normal range of motion.     Comments: Left knee is normal in appearance without any visible swelling, bony deformity, overlying skin changes, or warmth to touch, he is able to flex and extend left knee on his own without any apparent issue or elicited pain  Skin:    General: Skin is warm and dry.  Neurological:     Mental Status: He is alert and oriented to person, place, and time.     ED Results /  Procedures / Treatments   Labs (all labs ordered are listed, but only abnormal results are displayed) Labs Reviewed - No data to display  EKG None  Radiology No results found.  Procedures Procedures (including critical care time)  Medications Ordered in ED Medications - No data to display  ED Course  I have reviewed the triage vital signs and the nursing notes.  Pertinent labs & imaging results that were available during my care of the patient were reviewed by me and considered in my medical decision making (see chart for details).    MDM Rules/Calculators/A&P  69 year old male presenting to the ED with chronic left knee pain.  States has had this pain for years since an injury while in the [employer].  States pain is worse when it is colder raining.  He is afebrile and nontoxic in appearance here.  He does appear somewhat drowsy and possibly under the influence.  Left knee is normal in appearance without any visible swelling, bony deformity, overlying skin changes, warmth to touch.  I did not have any suspicion for acute fracture or other traumatic injury.  No signs or symptoms suggestive of septic joint.  He was offered a knee sleeve to help with his chronic pain. Encouraged tylenol or motrin at home PRN. He will need to follow-up with his primary care doctor.  He can return here for any new or acute changes.  Final Clinical Impression(s) / ED Diagnoses Final diagnoses:  Chronic pain of left knee    Rx / DC Orders ED Discharge Orders    None         [name], PA-C 11/02/19 0250    Merrily Pew, MD 11/02/19 [phone]

## 2019-11-02 NOTE — ED Triage Notes (Signed)
Pt reports that his L knee is hurting him for years from a injury in the [place], it hurts when it is cold, seen here yesterday for the same

## 2019-11-02 NOTE — ED Notes (Signed)
Discharge instructions discussed with pt. Pt verbalized understanding with no questions at this time. Pt states he will go home with daughter.

## 2019-11-02 NOTE — Telephone Encounter (Signed)
Attempted to call patient to schedule pre-op clearance appointment prior to dental procedure.   No answer. Left VM that we were trying to reach him.  Please call patient and schedule pre-op clearance. Can be with another provider if I am unavailable before his surgery.   Dalphine Handing, PGY-3 [facility] 11/02/2019 1:38 PM

## 2019-11-02 NOTE — Telephone Encounter (Signed)
Called patient and LVM to call clinic and schedule an appointment.  [name], [place]

## 2019-11-03 ENCOUNTER — Other Ambulatory Visit: Payer: Self-pay

## 2019-11-03 ENCOUNTER — Encounter (HOSPITAL_COMMUNITY): Payer: Self-pay | Admitting: Emergency Medicine

## 2019-11-03 ENCOUNTER — Emergency Department (HOSPITAL_COMMUNITY)
Admission: EM | Admit: 2019-11-03 | Discharge: 2019-11-03 | Disposition: A | Discharge status: Home / Self Care | Payer: Medicare Other | Attending: Emergency Medicine | Admitting: Emergency Medicine

## 2019-11-03 DIAGNOSIS — Z7982 Long term (current) use of aspirin: Secondary | ICD-10-CM | POA: Insufficient documentation

## 2019-11-03 DIAGNOSIS — F17210 Nicotine dependence, cigarettes, uncomplicated: Secondary | ICD-10-CM | POA: Diagnosis not present

## 2019-11-03 DIAGNOSIS — I10 Essential (primary) hypertension: Secondary | ICD-10-CM | POA: Diagnosis not present

## 2019-11-03 DIAGNOSIS — M79671 Pain in right foot: Secondary | ICD-10-CM | POA: Diagnosis present

## 2019-11-03 DIAGNOSIS — Z79899 Other long term (current) drug therapy: Secondary | ICD-10-CM | POA: Diagnosis not present

## 2019-11-03 DIAGNOSIS — L84 Corns and callosities: Secondary | ICD-10-CM

## 2019-11-03 NOTE — ED Provider Notes (Signed)
[facility] EMERGENCY DEPARTMENT Provider Note   CSN: [id number] Arrival date & time: 11/03/19  0037     History Chief Complaint  Patient presents with  . Foot Pain    [name] is a 69 y.o. male.  Patient to ED with complaint of right foot pain. He reports having a growth on the side of his heel that hurts and is interfering with walking/working. Unable to state how long it has been there. No drainage. No ascending pain or swelling.   The history is provided by the patient. No language interpreter was used.  Foot Pain       Past Medical History:  Diagnosis Date  . BPH (benign prostatic hyperplasia)   . Colon polyp 2010  . Depression   . GERD (gastroesophageal reflux disease)   . Hepatitis C    "caught it when I had a blood transfusion"  . High cholesterol   . History of blood transfusion    "when I was young"  . Hypertension   . Paranoid schizophrenia ([place])   . Prostate atrophy   . Retinal vein occlusion     Patient Active Problem List   Diagnosis Date Noted  . GERD (gastroesophageal reflux disease) 05/03/2019  . Hypotension 08/02/2018  . Low back sprain, initial encounter 10/21/2017  . History of drug abuse ([place]) 10/18/2017  . Gingival erythema 09/23/2017  . Chronic pain of left knee 11/24/2016  . Tobacco abuse   . Diastolic heart failure ([place]) 02/05/2015  . Healthcare maintenance 05/28/2014  . Hyperlipidemia 03/10/2014  . Patellar tendonitis 11/16/2012  . [place] OCCLUSION 06/03/2010  . PARANOID SCHIZOPHRENIA, CHRONIC 01/17/2009  . HYPERTENSION, BENIGN ESSENTIAL 01/17/2009  . BPH (benign prostatic hyperplasia) 01/17/2009  . HEPATITIS C 12/14/2008  . DEPRESSION 12/14/2008    Past Surgical History:  Procedure Laterality Date  . CIRCUMCISION  1959  . TONSILLECTOMY         Family History  Problem Relation Age of Onset  . Hypertension Mother   . Diabetes Mother   . Stroke Mother     Social History    Tobacco Use  . Smoking status: Current Every Day Smoker    Packs/day: 0.50    Years: 48.00    Pack years: 24.00    Types: Cigarettes  . Smokeless tobacco: Never Used  Substance Use Topics  . Alcohol use: No  . Drug use: No    Home Medications Prior to Admission medications   Medication Sig Start Date End Date Taking? Authorizing Provider  acetaminophen (TYLENOL) 500 MG tablet Take 1 tablet (500 mg total) by mouth every 6 (six) hours as needed. 04/23/18   [name], [name], PA-C  amLODipine (NORVASC) 10 MG tablet Take 1 tablet (10 mg total) by mouth daily. 04/13/18   [name], DO  aspirin 81 MG tablet Take 1 tablet (81 mg total) by mouth daily. 09/24/14   [name], MD  atorvastatin (LIPITOR) 40 MG tablet Take 1 tablet (40 mg total) by mouth daily. 08/15/19   [name], DO  azithromycin (ZITHROMAX) 250 MG tablet Take 1 tablet (250 mg total) by mouth daily. 01/17/17   Law, Bea Graff, PA-C  benztropine (COGENTIN) 1 MG tablet Take 1 tablet (1 mg total) by mouth 2 (two) times daily. 11/05/16   [name], MD  cetirizine (ZYRTEC) 10 MG tablet take 1 tablet by mouth once daily 02/20/15   [name], MD  diclofenac sodium (VOLTAREN) 1 % GEL Apply  2 g topically 3 (three) times daily as needed. 07/04/18   [name], MD  famotidine (PEPCID) 20 MG tablet Take 1 tablet (20 mg total) by mouth 2 (two) times daily. 05/02/19   [name], DO  fluPHENAZine decanoate (PROLIXIN) 25 MG/ML injection Inject 25 mg into the muscle every 14 (fourteen) days. Last dose 09/07/12    [provider]  fluticasone (FLONASE) 50 MCG/ACT nasal spray Place 1 spray into both nostrils daily. 11/07/16   Shary Decamp, PA-C  loratadine (CLARITIN) 10 MG tablet Take 1 tablet (10 mg total) by mouth daily. 11/09/17   Couture, Cortni S, PA-C  losartan (COZAAR) 100 MG tablet TAKE 1 TABLET BY MOUTH EVERY DAY 12/06/18   [name], DO  Multiple Vitamin (MULTIVITAMIN WITH MINERALS) TABS tablet  Take 1 tablet by mouth daily. 06/28/14   [name], MD  tamsulosin (FLOMAX) 0.4 MG CAPS capsule Take 2 capsules (0.8 mg total) by mouth daily. 11/27/17   [name], MD  tamsulosin (FLOMAX) 0.4 MG CAPS capsule TAKE 2 CAPSULE BY MOUTH DAILY 04/17/19   [name], DO  traMADol (ULTRAM) 50 MG tablet Take 1 tablet (50 mg total) by mouth every 6 (six) hours as needed. 07/04/18   [name], MD  triamcinolone cream (KENALOG) 0.1 % Apply 1 application topically 2 (two) times daily. For 5 days, then twice daily as needed for rash and itching. 01/03/15   [name], MD  vitamin C (ASCORBIC ACID) 500 MG tablet Take 500 mg by mouth daily.    [provider]    Allergies    Lisinopril, Penicillins, Amoxicillin, and Ibuprofen  Review of Systems   Review of Systems  Constitutional: Negative for fever.  Musculoskeletal:       See HPI  Skin:       "growth" on right heel  Neurological: Negative.  Negative for numbness.    Physical Exam Updated Vital Signs BP (!) 180/101 (BP Location: Left Arm)   Pulse 68   Temp 98.8 F (37.1 C) (Oral)   Resp 16   Ht 5\' 9"  (1.753 m)   Wt 75.7 kg   SpO2 100%   BMI 24.65 kg/m   Physical Exam Vitals and nursing note reviewed.  Constitutional:      Appearance: He is well-developed.  Pulmonary:     Effort: Pulmonary effort is normal.  Musculoskeletal:        General: Normal range of motion.     Cervical back: Normal range of motion.  Skin:    General: Skin is warm and dry.     Comments: 2 cm hard callous formation lateral right heel. No redness, drainage, bleeding. No significant tenderness.   Neurological:     Mental Status: He is alert and oriented to person, place, and time.     ED Results / Procedures / Treatments   Labs (all labs ordered are listed, but only abnormal results are displayed) Labs Reviewed - No data to display  EKG None  Radiology No results found.  Procedures Procedures  (including critical care time)  Medications Ordered in ED Medications - No data to display  ED Course  I have reviewed the triage vital signs and the nursing notes.  Pertinent labs & imaging results that were available during my care of the patient were reviewed by me and considered in my medical decision making (see chart for details).    MDM Rules/Calculators/A&P  Patient to ED with pain on right heel for unknown time.   There is a callous without acute findings and no evidence of infection. Will refer to PCP for further management.    Final Clinical Impression(s) / ED Diagnoses Final diagnoses:  None   1. Callous  Rx / DC Orders ED Discharge Orders    None       Charlann Lange, PA-C 11/03/19 0330    Fatima Blank, MD 11/03/19 [phone]

## 2019-11-03 NOTE — ED Notes (Signed)
Discharge instructions discussed with pt. Pt verbalized understanding with no questions at this time.  

## 2019-11-03 NOTE — ED Notes (Signed)
Pt was able to move self from wheelchair to stretcher without assistance.

## 2019-11-03 NOTE — ED Triage Notes (Signed)
  Patient comes in with R foot pain.  Patient states he has a corn on his R heel and the pain is unbearable.  He has been using cocoa butter on it to soften it up but it still hurts.  Pain 10/10.

## 2019-11-04 ENCOUNTER — Emergency Department (HOSPITAL_COMMUNITY)
Admission: EM | Admit: 2019-11-04 | Discharge: 2019-11-05 | Disposition: A | Discharge status: Home / Self Care | Payer: Medicare Other | Attending: Emergency Medicine | Admitting: Emergency Medicine

## 2019-11-04 ENCOUNTER — Other Ambulatory Visit: Payer: Self-pay

## 2019-11-04 DIAGNOSIS — Z20822 Contact with and (suspected) exposure to covid-19: Secondary | ICD-10-CM | POA: Diagnosis not present

## 2019-11-04 DIAGNOSIS — R05 Cough: Secondary | ICD-10-CM | POA: Insufficient documentation

## 2019-11-04 DIAGNOSIS — I10 Essential (primary) hypertension: Secondary | ICD-10-CM | POA: Diagnosis not present

## 2019-11-04 DIAGNOSIS — Z79899 Other long term (current) drug therapy: Secondary | ICD-10-CM | POA: Diagnosis not present

## 2019-11-04 DIAGNOSIS — Z7982 Long term (current) use of aspirin: Secondary | ICD-10-CM | POA: Insufficient documentation

## 2019-11-04 DIAGNOSIS — F17210 Nicotine dependence, cigarettes, uncomplicated: Secondary | ICD-10-CM | POA: Diagnosis not present

## 2019-11-04 NOTE — ED Triage Notes (Signed)
Per pt he has been having a bad cough and some chills and fevers for about 3 days.

## 2019-11-05 ENCOUNTER — Emergency Department (HOSPITAL_COMMUNITY): Payer: Medicare Other

## 2019-11-05 DIAGNOSIS — R05 Cough: Secondary | ICD-10-CM | POA: Diagnosis not present

## 2019-11-05 LAB — SARS CORONAVIRUS 2 (TAT 6-24 HRS): SARS Coronavirus 2: NEGATIVE

## 2019-11-05 MED ORDER — BENZONATATE 100 MG PO CAPS: 100.0000 mg | ORAL_CAPSULE | Freq: Three times a day (TID) | ORAL | 0 refills | Status: DC

## 2019-11-05 NOTE — ED Provider Notes (Signed)
[facility] EMERGENCY DEPARTMENT Provider Note   CSN: [id number] Arrival date & time: 11/04/19  2329     History Chief Complaint  Patient presents with  . Cough    [name] is a 69 y.o. male with a past medical history of hypertension, paranoid schizophrenia, depression presenting to the ED with a chief complaint of cough and chills.  States that 3 days ago has been having a persistent dry cough and chills.  He has tried some "leftover cough medicine" without much improvement in his symptoms.  He denies any sick contacts with similar symptoms.  He reports normal activity and appetite level.  Denies any shortness of breath, chest pain, abdominal pain, vomiting, injuries or falls, hemoptysis or leg swelling.  HPI     Past Medical History:  Diagnosis Date  . BPH (benign prostatic hyperplasia)   . Colon polyp 2010  . Depression   . GERD (gastroesophageal reflux disease)   . Hepatitis C    "caught it when I had a blood transfusion"  . High cholesterol   . History of blood transfusion    "when I was young"  . Hypertension   . Paranoid schizophrenia ([place])   . Prostate atrophy   . Retinal vein occlusion     Patient Active Problem List   Diagnosis Date Noted  . GERD (gastroesophageal reflux disease) 05/03/2019  . Hypotension 08/02/2018  . Low back sprain, initial encounter 10/21/2017  . History of drug abuse ([place]) 10/18/2017  . Gingival erythema 09/23/2017  . Chronic pain of left knee 11/24/2016  . Tobacco abuse   . Diastolic heart failure ([place]) 02/05/2015  . Healthcare maintenance 05/28/2014  . Hyperlipidemia 03/10/2014  . Patellar tendonitis 11/16/2012  . [place] OCCLUSION 06/03/2010  . PARANOID SCHIZOPHRENIA, CHRONIC 01/17/2009  . HYPERTENSION, BENIGN ESSENTIAL 01/17/2009  . BPH (benign prostatic hyperplasia) 01/17/2009  . HEPATITIS C 12/14/2008  . DEPRESSION 12/14/2008    Past Surgical History:  Procedure Laterality Date  .  CIRCUMCISION  1959  . TONSILLECTOMY         Family History  Problem Relation Age of Onset  . Hypertension Mother   . Diabetes Mother   . Stroke Mother     Social History   Tobacco Use  . Smoking status: Current Every Day Smoker    Packs/day: 0.50    Years: 48.00    Pack years: 24.00    Types: Cigarettes  . Smokeless tobacco: Never Used  Substance Use Topics  . Alcohol use: No  . Drug use: No    Home Medications Prior to Admission medications   Medication Sig Start Date End Date Taking? Authorizing Provider  acetaminophen (TYLENOL) 500 MG tablet Take 1 tablet (500 mg total) by mouth every 6 (six) hours as needed. 04/23/18   [name], [name], PA-C  amLODipine (NORVASC) 10 MG tablet Take 1 tablet (10 mg total) by mouth daily. 04/13/18   [name], DO  aspirin 81 MG tablet Take 1 tablet (81 mg total) by mouth daily. 09/24/14   [name], MD  atorvastatin (LIPITOR) 40 MG tablet Take 1 tablet (40 mg total) by mouth daily. 08/15/19   [name], DO  azithromycin (ZITHROMAX) 250 MG tablet Take 1 tablet (250 mg total) by mouth daily. 01/17/17   Law, Bea Graff, PA-C  benzonatate (TESSALON) 100 MG capsule Take 1 capsule (100 mg total) by mouth every 8 (eight) hours. 11/05/19   Nester Bachus, PA-C  benztropine (COGENTIN) 1  MG tablet Take 1 tablet (1 mg total) by mouth 2 (two) times daily. 11/05/16   [name], MD  cetirizine (ZYRTEC) 10 MG tablet take 1 tablet by mouth once daily 02/20/15   [name], MD  diclofenac sodium (VOLTAREN) 1 % GEL Apply 2 g topically 3 (three) times daily as needed. 07/04/18   [name], MD  famotidine (PEPCID) 20 MG tablet Take 1 tablet (20 mg total) by mouth 2 (two) times daily. 05/02/19   [name], DO  fluPHENAZine decanoate (PROLIXIN) 25 MG/ML injection Inject 25 mg into the muscle every 14 (fourteen) days. Last dose 09/07/12    [provider]  fluticasone (FLONASE) 50 MCG/ACT nasal spray Place 1 spray into both  nostrils daily. 11/07/16   Shary Decamp, PA-C  loratadine (CLARITIN) 10 MG tablet Take 1 tablet (10 mg total) by mouth daily. 11/09/17   Couture, Cortni S, PA-C  losartan (COZAAR) 100 MG tablet TAKE 1 TABLET BY MOUTH EVERY DAY 12/06/18   [name], DO  Multiple Vitamin (MULTIVITAMIN WITH MINERALS) TABS tablet Take 1 tablet by mouth daily. 06/28/14   [name], MD  tamsulosin (FLOMAX) 0.4 MG CAPS capsule Take 2 capsules (0.8 mg total) by mouth daily. 11/27/17   [name], MD  tamsulosin (FLOMAX) 0.4 MG CAPS capsule TAKE 2 CAPSULE BY MOUTH DAILY 04/17/19   [name], DO  traMADol (ULTRAM) 50 MG tablet Take 1 tablet (50 mg total) by mouth every 6 (six) hours as needed. 07/04/18   [name], MD  triamcinolone cream (KENALOG) 0.1 % Apply 1 application topically 2 (two) times daily. For 5 days, then twice daily as needed for rash and itching. 01/03/15   [name], MD  vitamin C (ASCORBIC ACID) 500 MG tablet Take 500 mg by mouth daily.    [provider]    Allergies    Lisinopril, Penicillins, Amoxicillin, and Ibuprofen  Review of Systems   Review of Systems  Constitutional: Positive for chills. Negative for fever.  Respiratory: Positive for cough. Negative for shortness of breath.   Cardiovascular: Negative for chest pain.  Gastrointestinal: Negative for nausea and vomiting.  Musculoskeletal: Negative for myalgias.    Physical Exam Updated Vital Signs BP (!) 169/91 (BP Location: Right Arm)   Pulse 70   Temp 98.4 F (36.9 C) (Oral)   Resp 16   SpO2 98%   Physical Exam Vitals and nursing note reviewed.  Constitutional:      General: He is not in acute distress.    Appearance: He is well-developed. He is not diaphoretic.  HENT:     Head: Normocephalic and atraumatic.  Eyes:     General: No scleral icterus.    Conjunctiva/sclera: Conjunctivae normal.  Cardiovascular:     Rate and Rhythm: Normal rate and regular rhythm.     Heart  sounds: Normal heart sounds.  Pulmonary:     Effort: Pulmonary effort is normal. No respiratory distress.     Breath sounds: Normal breath sounds.  Musculoskeletal:     Cervical back: Normal range of motion.     Right lower leg: No edema.     Left lower leg: No edema.  Skin:    Findings: No rash.  Neurological:     Mental Status: He is alert.     ED Results / Procedures / Treatments   Labs (all labs ordered are listed, but only abnormal results are displayed) Labs Reviewed  SARS CORONAVIRUS 2 (TAT 6-24 HRS)  EKG None  Radiology DG Chest 2 View  Result Date: 11/05/2019 CLINICAL DATA:  Cough EXAM: CHEST - 2 VIEW COMPARISON:  01/17/2017 FINDINGS: Heart and mediastinal contours are within normal limits. No focal opacities or effusions. No acute bony abnormality. IMPRESSION: No active cardiopulmonary disease. Electronically Signed   By: Rolm Baptise M.D.   On: 11/05/2019 00:33    Procedures Procedures (including critical care time)  Medications Ordered in ED Medications - No data to display  ED Course  I have reviewed the triage vital signs and the nursing notes.  Pertinent labs & imaging results that were available during my care of the patient were reviewed by me and considered in my medical decision making (see chart for details).    MDM Rules/Calculators/A&P                      WESTLEY SIMONELLI was evaluated in Emergency Department on 11/05/19 for the symptoms described in the history of present illness. He/she was evaluated in the context of the global COVID-19 pandemic, which necessitated consideration that the patient might be at risk for infection with the SARS-CoV-2 virus that causes COVID-19. Institutional protocols and algorithms that pertain to the evaluation of patients at risk for COVID-19 are in a state of rapid change based on information released by regulatory bodies including the CDC and federal and state organizations. These policies and algorithms were  followed during the patient's care in the ED.  69 year old male presents to ED for cough and chills for the past 3 days.  On my exam patient is overall well-appearing, sleeping comfortably.  He has not cough during my evaluation, lungs are clear to auscultation bilaterally.  He denies any chest pain, shortness of breath or abdominal pain.  He is afebrile here without recent use of antipyretics.  He is not hypoxic or tachycardic.  Chest x-ray is unremarkable.  Will send for Covid swab as I suspect Covid or other viral illness as a cause of his symptoms.  Patient will be given prescription for [facility] and knows to return for worsening symptoms.  Patient is hemodynamically stable, in NAD. Evaluation does not show pathology that would require ongoing emergent intervention or inpatient treatment. I have personally reviewed and interpreted all lab work and imaging at today's ED visit. I explained the diagnosis to the patient. Pain has been managed and has no complaints prior to discharge. Patient is comfortable with above plan and is stable for discharge at this time. All questions were answered prior to disposition. Strict return precautions for returning to the ED were discussed. Encouraged follow up with PCP.   An After Visit Summary was printed and given to the patient.   Portions of this note were generated with [employer]. Dictation errors may occur despite best attempts at proofreading.   Final Clinical Impression(s) / ED Diagnoses Final diagnoses:  Suspected COVID-19 virus infection    Rx / DC Orders ED Discharge Orders         Ordered    benzonatate (TESSALON) 100 MG capsule  Every 8 hours     11/05/19 0648           Delia Heady, PA-C 11/05/19 0715    Fredia Sorrow, MD 11/05/19 [phone]

## 2019-11-05 NOTE — Discharge Instructions (Signed)
Take the medicine as needed to help with your cough. Return to the ED if you start to experience chest pain, shortness of breath, leg swelling.

## 2019-11-05 NOTE — ED Notes (Signed)
Pt reported he was tired and he has been DC home Du Pt then  Pt stated " you are nasty " Pt then jumped off the bed at this writer. Pt was given a Bus pass and security took pt out with WC.

## 2019-11-09 ENCOUNTER — Emergency Department (HOSPITAL_COMMUNITY)
Admission: EM | Admit: 2019-11-09 | Discharge: 2019-11-10 | Disposition: A | Discharge status: Home / Self Care | Payer: Medicare Other | Attending: Emergency Medicine | Admitting: Emergency Medicine

## 2019-11-09 ENCOUNTER — Telehealth: Payer: Self-pay | Admitting: Family Medicine

## 2019-11-09 ENCOUNTER — Other Ambulatory Visit: Payer: Self-pay

## 2019-11-09 ENCOUNTER — Encounter (HOSPITAL_COMMUNITY): Payer: Self-pay

## 2019-11-09 DIAGNOSIS — Z7982 Long term (current) use of aspirin: Secondary | ICD-10-CM | POA: Insufficient documentation

## 2019-11-09 DIAGNOSIS — Z79899 Other long term (current) drug therapy: Secondary | ICD-10-CM | POA: Insufficient documentation

## 2019-11-09 DIAGNOSIS — I10 Essential (primary) hypertension: Secondary | ICD-10-CM | POA: Insufficient documentation

## 2019-11-09 DIAGNOSIS — R059 Cough, unspecified: Secondary | ICD-10-CM

## 2019-11-09 DIAGNOSIS — R05 Cough: Secondary | ICD-10-CM | POA: Insufficient documentation

## 2019-11-09 NOTE — Telephone Encounter (Signed)
Called patient to schedule medical clearance. Unable to reach him or leave VM. We have attempted to reach patient several times over the phone to schedule this appointment and on 11/02/19 a VM was left to schedule this appointment as well. Given that we are unable to reach patient via the phone I will send a letter instructing patient to schedule this appointment. Unable to provide clearance without this appointment.   If patient calls, please schedule him at the earliest appointment to have a pre-op clearance. This can be with any provider if I am unavailable.   Dalphine Handing, PGY-3 [facility] 11/09/2019 8:53 AM

## 2019-11-09 NOTE — ED Triage Notes (Signed)
Pt arrives to ED w/ productive cough x 1 week. Pt denies fever/chills or sore throat.

## 2019-11-10 NOTE — ED Notes (Signed)
Ambulated pt on pulse ox, Sp02 dropped to 98%

## 2019-11-10 NOTE — Discharge Instructions (Addendum)
We recommend over-the-counter Delsym or Robitussin for management of your persistent cough.  Follow-up with family practice for evaluation of persistent symptoms.  Discontinue smoking as this may worsen your cough.  You may return for new or concerning symptoms.

## 2019-11-10 NOTE — ED Notes (Signed)
Discharge instructions reviewed with pt. Pt verbalized understanding.   

## 2019-11-10 NOTE — ED Provider Notes (Signed)
[facility] EMERGENCY DEPARTMENT Provider Note   CSN: [id number] Arrival date & time: 11/09/19  2335     History Chief Complaint  Patient presents with  . Cough    [name] is a 69 y.o. male.   69 year old male with a history of reflux, hepatitis C, dyslipidemia, hypertension, paranoid schizophrenia presents to the emergency department for evaluation of cough x1 week.  He was evaluated for this a few days ago with negative Covid testing and reassuring chest x-ray.  States that he continues to smoke 2 to 3 cigarettes/day.  Cough has been intermittent.  No associated fevers, chest pain, leg swelling.  The patient is not on any ACE inhibitor's.  He has follow up with his PCP on Tuesday for this, apparently.  This is his 5th visit to the ED in the past 9 days.  The history is provided by the patient. No language interpreter was used.       Past Medical History:  Diagnosis Date  . BPH (benign prostatic hyperplasia)   . Colon polyp 2010  . Depression   . GERD (gastroesophageal reflux disease)   . Hepatitis C    "caught it when I had a blood transfusion"  . High cholesterol   . History of blood transfusion    "when I was young"  . Hypertension   . Paranoid schizophrenia ([place])   . Prostate atrophy   . Retinal vein occlusion     Patient Active Problem List   Diagnosis Date Noted  . GERD (gastroesophageal reflux disease) 05/03/2019  . Hypotension 08/02/2018  . Low back sprain, initial encounter 10/21/2017  . History of drug abuse ([place]) 10/18/2017  . Gingival erythema 09/23/2017  . Chronic pain of left knee 11/24/2016  . Tobacco abuse   . Diastolic heart failure ([place]) 02/05/2015  . Healthcare maintenance 05/28/2014  . Hyperlipidemia 03/10/2014  . Patellar tendonitis 11/16/2012  . [place] OCCLUSION 06/03/2010  . PARANOID SCHIZOPHRENIA, CHRONIC 01/17/2009  . HYPERTENSION, BENIGN ESSENTIAL 01/17/2009  . BPH (benign prostatic hyperplasia)  01/17/2009  . HEPATITIS C 12/14/2008  . DEPRESSION 12/14/2008    Past Surgical History:  Procedure Laterality Date  . CIRCUMCISION  1959  . TONSILLECTOMY         Family History  Problem Relation Age of Onset  . Hypertension Mother   . Diabetes Mother   . Stroke Mother     Social History   Tobacco Use  . Smoking status: Current Every Day Smoker    Packs/day: 0.50    Years: 48.00    Pack years: 24.00    Types: Cigarettes  . Smokeless tobacco: Never Used  Substance Use Topics  . Alcohol use: No  . Drug use: No    Home Medications Prior to Admission medications   Medication Sig Start Date End Date Taking? Authorizing Provider  acetaminophen (TYLENOL) 500 MG tablet Take 1 tablet (500 mg total) by mouth every 6 (six) hours as needed. 04/23/18   [name], [name], PA-C  amLODipine (NORVASC) 10 MG tablet Take 1 tablet (10 mg total) by mouth daily. 04/13/18   [name], DO  aspirin 81 MG tablet Take 1 tablet (81 mg total) by mouth daily. 09/24/14   [name], MD  atorvastatin (LIPITOR) 40 MG tablet Take 1 tablet (40 mg total) by mouth daily. 08/15/19   [name], DO  azithromycin (ZITHROMAX) 250 MG tablet Take 1 tablet (250 mg total) by mouth daily. 01/17/17   Law,  Bea Graff, PA-C  benzonatate (TESSALON) 100 MG capsule Take 1 capsule (100 mg total) by mouth every 8 (eight) hours. 11/05/19   Khatri, Hina, PA-C  benztropine (COGENTIN) 1 MG tablet Take 1 tablet (1 mg total) by mouth 2 (two) times daily. 11/05/16   [name], MD  cetirizine (ZYRTEC) 10 MG tablet take 1 tablet by mouth once daily 02/20/15   [name], MD  diclofenac sodium (VOLTAREN) 1 % GEL Apply 2 g topically 3 (three) times daily as needed. 07/04/18   [name], MD  famotidine (PEPCID) 20 MG tablet Take 1 tablet (20 mg total) by mouth 2 (two) times daily. 05/02/19   [name], DO  fluPHENAZine decanoate (PROLIXIN) 25 MG/ML injection Inject 25 mg into the muscle every 14 (fourteen)  days. Last dose 09/07/12    [provider]  fluticasone (FLONASE) 50 MCG/ACT nasal spray Place 1 spray into both nostrils daily. 11/07/16   Shary Decamp, PA-C  loratadine (CLARITIN) 10 MG tablet Take 1 tablet (10 mg total) by mouth daily. 11/09/17   Couture, Cortni S, PA-C  losartan (COZAAR) 100 MG tablet TAKE 1 TABLET BY MOUTH EVERY DAY 12/06/18   [name], DO  Multiple Vitamin (MULTIVITAMIN WITH MINERALS) TABS tablet Take 1 tablet by mouth daily. 06/28/14   [name], MD  tamsulosin (FLOMAX) 0.4 MG CAPS capsule Take 2 capsules (0.8 mg total) by mouth daily. 11/27/17   [name], MD  tamsulosin (FLOMAX) 0.4 MG CAPS capsule TAKE 2 CAPSULE BY MOUTH DAILY 04/17/19   [name], DO  traMADol (ULTRAM) 50 MG tablet Take 1 tablet (50 mg total) by mouth every 6 (six) hours as needed. 07/04/18   [name], MD  triamcinolone cream (KENALOG) 0.1 % Apply 1 application topically 2 (two) times daily. For 5 days, then twice daily as needed for rash and itching. 01/03/15   [name], MD  vitamin C (ASCORBIC ACID) 500 MG tablet Take 500 mg by mouth daily.    [provider]    Allergies    Lisinopril, Penicillins, Amoxicillin, and Ibuprofen  Review of Systems   Review of Systems  Ten systems reviewed and are negative for acute change, except as noted in the HPI.    Physical Exam Updated Vital Signs BP (!) 176/91   Pulse 60   Temp 98.5 F (36.9 C) (Oral)   Resp 14   SpO2 100%   Physical Exam Vitals and nursing note reviewed.  Constitutional:      General: He is not in acute distress.    Appearance: He is well-developed. He is not diaphoretic.     Comments: Nontoxic appearing and in NAD  HENT:     Head: Normocephalic and atraumatic.  Eyes:     General: No scleral icterus.    Conjunctiva/sclera: Conjunctivae normal.  Pulmonary:     Effort: Pulmonary effort is normal. No respiratory distress.     Comments: Lungs grossly CTAB.  Respirations even and unlabored. Musculoskeletal:        General: Normal range of motion.     Cervical back: Normal range of motion.  Skin:    General: Skin is warm and dry.     Coloration: Skin is not pale.     Findings: No erythema or rash.  Neurological:     Mental Status: He is alert and oriented to person, place, and time.  Psychiatric:        Behavior: Behavior normal.     ED  Results / Procedures / Treatments   Labs (all labs ordered are listed, but only abnormal results are displayed) Labs Reviewed - No data to display  EKG None  Radiology No results found.  Procedures Procedures (including critical care time)  Medications Ordered in ED Medications - No data to display  ED Course  I have reviewed the triage vital signs and the nursing notes.  Pertinent labs & imaging results that were available during my care of the patient were reviewed by me and considered in my medical decision making (see chart for details).    MDM Rules/Calculators/A&P                      69 year old male presents to the emergency department for persistent cough.  His chest x-ray from previous evaluation was reviewed and was negative.  He is afebrile today without hypoxia.  Ambulates in the ED without acute desaturation.  Is in no respiratory distress.  Lungs are grossly clear bilaterally.  Not on ACE inhibitors.  Recent negative COVID test.  Feel he can continue follow-up with his primary care doctor.  Counseled on the use of Robitussin or Delsym for persistent symptoms.  Return precautions discussed and provided. Patient discharged in stable condition with no unaddressed concerns.   Final Clinical Impression(s) / ED Diagnoses Final diagnoses:  Cough    Rx / DC Orders ED Discharge Orders    None       Antonietta Breach, PA-C 11/10/19 [id number]    Ripley Fraise, MD 11/10/19 [phone]

## 2019-11-11 ENCOUNTER — Encounter (HOSPITAL_COMMUNITY): Payer: Self-pay | Admitting: Emergency Medicine

## 2019-11-11 ENCOUNTER — Emergency Department (HOSPITAL_COMMUNITY): Payer: Medicare Other

## 2019-11-11 ENCOUNTER — Other Ambulatory Visit: Payer: Self-pay

## 2019-11-11 ENCOUNTER — Emergency Department (HOSPITAL_COMMUNITY)
Admission: EM | Admit: 2019-11-11 | Discharge: 2019-11-11 | Disposition: A | Discharge status: Home / Self Care | Payer: Medicare Other | Attending: Emergency Medicine | Admitting: Emergency Medicine

## 2019-11-11 DIAGNOSIS — I10 Essential (primary) hypertension: Secondary | ICD-10-CM | POA: Diagnosis not present

## 2019-11-11 DIAGNOSIS — Z79899 Other long term (current) drug therapy: Secondary | ICD-10-CM | POA: Diagnosis not present

## 2019-11-11 DIAGNOSIS — R059 Cough, unspecified: Secondary | ICD-10-CM

## 2019-11-11 DIAGNOSIS — R05 Cough: Secondary | ICD-10-CM | POA: Insufficient documentation

## 2019-11-11 DIAGNOSIS — Z7982 Long term (current) use of aspirin: Secondary | ICD-10-CM | POA: Insufficient documentation

## 2019-11-11 DIAGNOSIS — F17210 Nicotine dependence, cigarettes, uncomplicated: Secondary | ICD-10-CM | POA: Diagnosis not present

## 2019-11-11 NOTE — ED Triage Notes (Signed)
Pt c/o productive cough x 1 week, pt states he continues to smoke, has not followed up with his MD. Pt seen multiple times for same

## 2019-11-11 NOTE — ED Provider Notes (Signed)
TIME SEEN: 6:00 AM  CHIEF COMPLAINT: cough  HPI: Patient is a 69 year old male with history of hepatitis C, paranoid schizophrenia who presents to the emergency department with several days of dry nonproductive cough.  Denies fevers, chills, chest pain, shortness of breath, abdominal pain, vomiting, diarrhea, lower extremity swelling or pain.  Denies SI, HI or active hallucinations.  He has been seen in the emergency department several times for the same and has had negative chest x-rays and negative Covid.  He denies being homeless.  Denies symptoms of acid reflux or post nasal drip.  Not on ACE inhibitor's, ARB's.  ROS: See HPI Constitutional: no fever  Eyes: no drainage  ENT: no runny nose   Cardiovascular:  no chest pain  Resp: no SOB  GI: no vomiting GU: no dysuria Integumentary: no rash  Allergy: no hives  Musculoskeletal: no leg swelling  Neurological: no slurred speech ROS otherwise negative  PAST MEDICAL HISTORY/PAST SURGICAL HISTORY:  Past Medical History:  Diagnosis Date  . BPH (benign prostatic hyperplasia)   . Colon polyp 2010  . Depression   . GERD (gastroesophageal reflux disease)   . Hepatitis C    "caught it when I had a blood transfusion"  . High cholesterol   . History of blood transfusion    "when I was young"  . Hypertension   . Paranoid schizophrenia ([place])   . Prostate atrophy   . Retinal vein occlusion     MEDICATIONS:  Prior to Admission medications   Medication Sig Start Date End Date Taking? Authorizing Provider  acetaminophen (TYLENOL) 500 MG tablet Take 1 tablet (500 mg total) by mouth every 6 (six) hours as needed. 04/23/18   [name], [name], PA-C  amLODipine (NORVASC) 10 MG tablet Take 1 tablet (10 mg total) by mouth daily. 04/13/18   [name], DO  aspirin 81 MG tablet Take 1 tablet (81 mg total) by mouth daily. 09/24/14   [name], MD  atorvastatin (LIPITOR) 40 MG tablet Take 1 tablet (40 mg total) by mouth daily. 08/15/19    [name], DO  azithromycin (ZITHROMAX) 250 MG tablet Take 1 tablet (250 mg total) by mouth daily. 01/17/17   Law, Bea Graff, PA-C  benzonatate (TESSALON) 100 MG capsule Take 1 capsule (100 mg total) by mouth every 8 (eight) hours. 11/05/19   Khatri, Hina, PA-C  benztropine (COGENTIN) 1 MG tablet Take 1 tablet (1 mg total) by mouth 2 (two) times daily. 11/05/16   [name], MD  cetirizine (ZYRTEC) 10 MG tablet take 1 tablet by mouth once daily 02/20/15   [name], MD  diclofenac sodium (VOLTAREN) 1 % GEL Apply 2 g topically 3 (three) times daily as needed. 07/04/18   [name], MD  famotidine (PEPCID) 20 MG tablet Take 1 tablet (20 mg total) by mouth 2 (two) times daily. 05/02/19   [name], DO  fluPHENAZine decanoate (PROLIXIN) 25 MG/ML injection Inject 25 mg into the muscle every 14 (fourteen) days. Last dose 09/07/12    [provider]  fluticasone (FLONASE) 50 MCG/ACT nasal spray Place 1 spray into both nostrils daily. 11/07/16   Shary Decamp, PA-C  loratadine (CLARITIN) 10 MG tablet Take 1 tablet (10 mg total) by mouth daily. 11/09/17   Couture, Cortni S, PA-C  losartan (COZAAR) 100 MG tablet TAKE 1 TABLET BY MOUTH EVERY DAY 12/06/18   [name], DO  Multiple Vitamin (MULTIVITAMIN WITH MINERALS) TABS tablet Take 1 tablet by mouth daily. 06/28/14  [name], MD  tamsulosin (FLOMAX) 0.4 MG CAPS capsule Take 2 capsules (0.8 mg total) by mouth daily. 11/27/17   [name], MD  tamsulosin (FLOMAX) 0.4 MG CAPS capsule TAKE 2 CAPSULE BY MOUTH DAILY 04/17/19   [name], DO  traMADol (ULTRAM) 50 MG tablet Take 1 tablet (50 mg total) by mouth every 6 (six) hours as needed. 07/04/18   [name], MD  triamcinolone cream (KENALOG) 0.1 % Apply 1 application topically 2 (two) times daily. For 5 days, then twice daily as needed for rash and itching. 01/03/15   [name], MD  vitamin C (ASCORBIC ACID) 500 MG tablet Take 500 mg by  mouth daily.    [provider]    ALLERGIES:  Allergies  Allergen Reactions  . Lisinopril Other (See Comments)    Cough  . Penicillins Hives  . Amoxicillin Rash  . Ibuprofen Rash    SOCIAL HISTORY:  Social History   Tobacco Use  . Smoking status: Current Every Day Smoker    Packs/day: 0.50    Years: 48.00    Pack years: 24.00    Types: Cigarettes  . Smokeless tobacco: Never Used  Substance Use Topics  . Alcohol use: No    FAMILY HISTORY: Family History  Problem Relation Age of Onset  . Hypertension Mother   . Diabetes Mother   . Stroke Mother     EXAM: BP (!) 172/92   Pulse 65   Temp 98.2 F (36.8 C)   Resp 17   SpO2 100%  CONSTITUTIONAL: Alert and oriented and responds appropriately to questions.  Chronically ill-appearing.  He is not actively coughing.  Resting comfortably in the bed. HEAD: Normocephalic EYES: Conjunctivae clear, pupils appear equal, EOM appear intact ENT: normal nose; moist mucous membranes NECK: Supple, normal ROM CARD: RRR; S1 and S2 appreciated; no murmurs, no clicks, no rubs, no gallops RESP: Normal chest excursion without splinting or tachypnea; breath sounds clear and equal bilaterally; no wheezes, no rhonchi, no rales, no hypoxia or respiratory distress, speaking full sentences ABD/GI: Normal bowel sounds; non-distended; soft, non-tender, no rebound, no guarding, no peritoneal signs, no hepatosplenomegaly BACK:  The back appears normal EXT: Normal ROM in all joints; no deformity noted, no edema; no cyanosis SKIN: Normal color for age and race; warm; no rash on exposed skin NEURO: Moves all extremities equally PSYCH: Flat affect.  Poorly cooperative with exam and history.  MEDICAL DECISION MAKING: Patient here with dry cough.  Lungs clear.  No chest pain, shortness of breath, fever, lower extremity swelling or pain.  Doubt PE.  No symptoms of postnasal drip, acid reflux.  This is patient's third visit for the same and 6 visit  in the past month.  Suspect that patient is malingering.  He has not had any cough noted here in the emergency department.  He is hemodynamically stable and chest x-ray again today is clear.  I feel he is safe to be discharged and follow-up with his primary doctor.  He denies any psychiatric complaints.  He denies being homeless.  Does not appear intoxicated or in any distress.  At this time, I do not feel there is any life-threatening condition present. I have reviewed, interpreted and discussed all results (EKG, imaging, lab, urine as appropriate) and exam findings with patient/family. I have reviewed nursing notes and appropriate previous records.  I feel the patient is safe to be discharged home without further emergent workup and can continue workup as an  outpatient as needed. Discussed usual and customary return precautions. Patient/family verbalize understanding and are comfortable with this plan.  Outpatient follow-up has been provided as needed. All questions have been answered.   [name] was evaluated in Emergency Department on 11/11/2019 for the symptoms described in the history of present illness. He was evaluated in the context of the global COVID-19 pandemic, which necessitated consideration that the patient might be at risk for infection with the SARS-CoV-2 virus that causes COVID-19. Institutional protocols and algorithms that pertain to the evaluation of patients at risk for COVID-19 are in a state of rapid change based on information released by regulatory bodies including the CDC and federal and state organizations. These policies and algorithms were followed during the patient's care in the ED.  Patient was seen wearing N95, face shield, gloves.    Kalyan Barabas, Delice Bison, DO 11/11/19 [phone]

## 2019-11-11 NOTE — ED Notes (Signed)
pt advises that he has a ride coming to pick him up upon discharge. Pt taken to ED lobby via wheelchair to wait for ride.

## 2019-11-11 NOTE — Discharge Instructions (Addendum)
Please follow up with your primary care physician.

## 2019-11-13 NOTE — Progress Notes (Signed)
[place] [facility] Visit I attempted to connect with  [name] on 11/14/19 by a video enabled telemedicine application.   Attempted x5 using telephone number provided by patient to front desk staff (this number was disconnected) as well as number under demographics tab.   Most recent attempt occurred at 11:06AM which is 79min past appointment time.   No answer. Left multiple VM regarding patients appointment, and last VM informed patient he missed appointment and would need to call to re-schedule this.   Of not appears patient was in the ED this AM and was reminded there to attend appointment as well.   Dalphine Handing, PGY-3 [facility] 11/14/2019 11:08 AM

## 2019-11-14 ENCOUNTER — Encounter: Payer: Self-pay | Admitting: Family Medicine

## 2019-11-14 ENCOUNTER — Other Ambulatory Visit: Payer: Self-pay

## 2019-11-14 ENCOUNTER — Encounter (HOSPITAL_COMMUNITY): Payer: Self-pay | Admitting: Emergency Medicine

## 2019-11-14 ENCOUNTER — Emergency Department (HOSPITAL_COMMUNITY)
Admission: EM | Admit: 2019-11-14 | Discharge: 2019-11-14 | Disposition: A | Discharge status: Home / Self Care | Payer: Medicare Other | Attending: Emergency Medicine | Admitting: Emergency Medicine

## 2019-11-14 ENCOUNTER — Telehealth (INDEPENDENT_AMBULATORY_CARE_PROVIDER_SITE_OTHER): Payer: Medicare Other | Admitting: Family Medicine

## 2019-11-14 DIAGNOSIS — R0981 Nasal congestion: Secondary | ICD-10-CM | POA: Diagnosis present

## 2019-11-14 DIAGNOSIS — J302 Other seasonal allergic rhinitis: Secondary | ICD-10-CM | POA: Insufficient documentation

## 2019-11-14 DIAGNOSIS — I110 Hypertensive heart disease with heart failure: Secondary | ICD-10-CM | POA: Insufficient documentation

## 2019-11-14 DIAGNOSIS — Z79899 Other long term (current) drug therapy: Secondary | ICD-10-CM | POA: Insufficient documentation

## 2019-11-14 DIAGNOSIS — I5030 Unspecified diastolic (congestive) heart failure: Secondary | ICD-10-CM | POA: Insufficient documentation

## 2019-11-14 DIAGNOSIS — Z5329 Procedure and treatment not carried out because of patient's decision for other reasons: Secondary | ICD-10-CM

## 2019-11-14 MED ORDER — CETIRIZINE HCL 10 MG PO TABS: 10.0000 mg | ORAL_TABLET | Freq: Every day | ORAL | 1 refills | Status: DC

## 2019-11-14 MED ORDER — FLUTICASONE PROPIONATE 50 MCG/ACT NA SUSP: 1.0000 | Freq: Every day | NASAL | 2 refills | Status: DC

## 2019-11-14 NOTE — ED Provider Notes (Signed)
[name] Provider Note   CSN: [id number] Arrival date & time: 11/14/19  0309     History Chief Complaint  Patient presents with  . Nasal Congestion/Cough    [name] is a 69 y.o. male.  Pt is a 69 y/o male with hx of hepatitis C, paranoid schizophrenia who is presenting today complaining of nasal congestion.  Patient has been seen 3 times in the last 1 week for similar symptoms but reports he no longer has a cough.  He denies any shortness of breath, nausea, vomiting, shortness of breath or fever.  Patient recently tested negative for Covid.  States he is eating and drinking normally but he has not been sleeping well and is very sleepy this morning.  He denies any problems with medications and had been prescribed some cough suppressants on his prior visits to the emergency room.  He has no abdominal or chest pain.  The history is provided by the patient.       Past Medical History:  Diagnosis Date  . BPH (benign prostatic hyperplasia)   . Colon polyp 2010  . Depression   . GERD (gastroesophageal reflux disease)   . Hepatitis C    "caught it when I had a blood transfusion"  . High cholesterol   . History of blood transfusion    "when I was young"  . Hypertension   . Paranoid schizophrenia ([place])   . Prostate atrophy   . Retinal vein occlusion     Patient Active Problem List   Diagnosis Date Noted  . GERD (gastroesophageal reflux disease) 05/03/2019  . Hypotension 08/02/2018  . Low back sprain, initial encounter 10/21/2017  . History of drug abuse ([place]) 10/18/2017  . Gingival erythema 09/23/2017  . Chronic pain of left knee 11/24/2016  . Tobacco abuse   . Diastolic heart failure ([place]) 02/05/2015  . Healthcare maintenance 05/28/2014  . Hyperlipidemia 03/10/2014  . Patellar tendonitis 11/16/2012  . [place] OCCLUSION 06/03/2010  . PARANOID SCHIZOPHRENIA, CHRONIC 01/17/2009  . HYPERTENSION, BENIGN ESSENTIAL  01/17/2009  . BPH (benign prostatic hyperplasia) 01/17/2009  . HEPATITIS C 12/14/2008  . DEPRESSION 12/14/2008    Past Surgical History:  Procedure Laterality Date  . CIRCUMCISION  1959  . TONSILLECTOMY         Family History  Problem Relation Age of Onset  . Hypertension Mother   . Diabetes Mother   . Stroke Mother     Social History   Tobacco Use  . Smoking status: Current Every Day Smoker    Packs/day: 0.50    Years: 48.00    Pack years: 24.00    Types: Cigarettes  . Smokeless tobacco: Never Used  Substance Use Topics  . Alcohol use: No  . Drug use: No    Home Medications Prior to Admission medications   Medication Sig Start Date End Date Taking? Authorizing Provider  acetaminophen (TYLENOL) 500 MG tablet Take 1 tablet (500 mg total) by mouth every 6 (six) hours as needed. 04/23/18   [name], [name], PA-C  amLODipine (NORVASC) 10 MG tablet Take 1 tablet (10 mg total) by mouth daily. 04/13/18   [name], DO  aspirin 81 MG tablet Take 1 tablet (81 mg total) by mouth daily. 09/24/14   [name], MD  atorvastatin (LIPITOR) 40 MG tablet Take 1 tablet (40 mg total) by mouth daily. 08/15/19   [name], DO  azithromycin (ZITHROMAX) 250 MG tablet Take 1 tablet (250  mg total) by mouth daily. 01/17/17   Law, Bea Graff, PA-C  benzonatate (TESSALON) 100 MG capsule Take 1 capsule (100 mg total) by mouth every 8 (eight) hours. 11/05/19   Khatri, Hina, PA-C  benztropine (COGENTIN) 1 MG tablet Take 1 tablet (1 mg total) by mouth 2 (two) times daily. 11/05/16   [name], MD  cetirizine (ZYRTEC) 10 MG tablet take 1 tablet by mouth once daily 02/20/15   [name], MD  diclofenac sodium (VOLTAREN) 1 % GEL Apply 2 g topically 3 (three) times daily as needed. 07/04/18   [name], MD  famotidine (PEPCID) 20 MG tablet Take 1 tablet (20 mg total) by mouth 2 (two) times daily. 05/02/19   [name], DO  fluPHENAZine decanoate (PROLIXIN) 25 MG/ML injection  Inject 25 mg into the muscle every 14 (fourteen) days. Last dose 09/07/12    [provider]  fluticasone (FLONASE) 50 MCG/ACT nasal spray Place 1 spray into both nostrils daily. 11/07/16   Shary Decamp, PA-C  loratadine (CLARITIN) 10 MG tablet Take 1 tablet (10 mg total) by mouth daily. 11/09/17   Couture, Cortni S, PA-C  losartan (COZAAR) 100 MG tablet TAKE 1 TABLET BY MOUTH EVERY DAY 12/06/18   [name], DO  Multiple Vitamin (MULTIVITAMIN WITH MINERALS) TABS tablet Take 1 tablet by mouth daily. 06/28/14   [name], MD  tamsulosin (FLOMAX) 0.4 MG CAPS capsule Take 2 capsules (0.8 mg total) by mouth daily. 11/27/17   [name], MD  tamsulosin (FLOMAX) 0.4 MG CAPS capsule TAKE 2 CAPSULE BY MOUTH DAILY 04/17/19   [name], DO  traMADol (ULTRAM) 50 MG tablet Take 1 tablet (50 mg total) by mouth every 6 (six) hours as needed. 07/04/18   [name], MD  triamcinolone cream (KENALOG) 0.1 % Apply 1 application topically 2 (two) times daily. For 5 days, then twice daily as needed for rash and itching. 01/03/15   [name], MD  vitamin C (ASCORBIC ACID) 500 MG tablet Take 500 mg by mouth daily.    [provider]    Allergies    Lisinopril, Penicillins, Amoxicillin, and Ibuprofen  Review of Systems   Review of Systems  All other systems reviewed and are negative.   Physical Exam Updated Vital Signs BP (!) 179/96 (BP Location: Left Arm)   Pulse 67   Temp 97.8 F (36.6 C) (Oral)   Resp 18   Ht 5\' 7"  (1.702 m)   Wt 75 kg   SpO2 100%   BMI 25.90 kg/m   Physical Exam Vitals and nursing note reviewed.  Constitutional:      General: He is not in acute distress.    Appearance: He is well-developed.     Comments: Sleeping in the room but easily arousable  HENT:     Head: Normocephalic and atraumatic.     Right Ear: Tympanic membrane normal.     Left Ear: Tympanic membrane normal.     Nose: Mucosal edema, congestion and rhinorrhea  present.     Mouth/Throat:     Mouth: Mucous membranes are moist.     Tongue: No lesions.     Palate: No mass.     Pharynx: Oropharynx is clear.     Tonsils: No tonsillar exudate.  Eyes:     Conjunctiva/sclera: Conjunctivae normal.     Pupils: Pupils are equal, round, and reactive to light.  Cardiovascular:     Rate and Rhythm: Normal rate and regular rhythm.  Heart sounds: No murmur.  Pulmonary:     Effort: Pulmonary effort is normal. No respiratory distress.     Breath sounds: Normal breath sounds. No wheezing or rales.  Abdominal:     General: There is no distension.     Palpations: Abdomen is soft.     Tenderness: There is no abdominal tenderness. There is no guarding or rebound.  Musculoskeletal:        General: No tenderness. Normal range of motion.     Cervical back: Normal range of motion and neck supple.  Skin:    General: Skin is warm and dry.     Findings: No erythema or rash.  Neurological:     Mental Status: He is oriented to person, place, and time.  Psychiatric:     Comments: Calm and cooperative.  At this time does not appear to be responding to internal stimuli.  Denies any psychiatric concerns at this time     ED Results / Procedures / Treatments   Labs (all labs ordered are listed, but only abnormal results are displayed) Labs Reviewed - No data to display  EKG None  Radiology No results found.  Procedures Procedures (including critical care time)  Medications Ordered in ED Medications - No data to display  ED Course  I have reviewed the triage vital signs and the nursing notes.  Pertinent labs & imaging results that were available during my care of the patient were reviewed by me and considered in my medical decision making (see chart for details).    MDM Rules/Calculators/A&P                      69 year old gentleman who is returning to the emergency room with complaint of nasal congestion that started bothering him this morning.   This is patient's now fourth visit to the emergency room for similar symptoms.  Patient tested Covid negative within the last week and does not have symptoms today concerning for Covid.  He is in no acute distress and sleeping comfortably.  He does have rhinorrhea and mucosal edema most consistent with allergies.  Recommended starting Zyrtec and Flonase and following up with [facility] where [facility] receives care.  Patient is not homeless he states he has power to his apartment and he is eating and drinking normally.  Denies any psychiatric concerns this morning. Pt has appt with pcp today and spoke with them and also encouraged the pt to go to appt.  Final Clinical Impression(s) / ED Diagnoses Final diagnoses:  Seasonal allergies    Rx / DC Orders ED Discharge Orders         Ordered    cetirizine (ZYRTEC ALLERGY) 10 MG tablet  Daily     11/14/19 0844    fluticasone (FLONASE) 50 MCG/ACT nasal spray  Daily     11/14/19 0844           Blanchie Dessert, MD 11/14/19 [phone]

## 2019-11-14 NOTE — ED Triage Notes (Signed)
Patient reports nasal congestion / runny nose and dry cough onset this morning . No fever or chills . Respirations unlabored .

## 2019-11-15 ENCOUNTER — Emergency Department (HOSPITAL_COMMUNITY)
Admission: EM | Admit: 2019-11-15 | Discharge: 2019-11-15 | Disposition: A | Discharge status: Home / Self Care | Payer: Medicare Other | Attending: Emergency Medicine | Admitting: Emergency Medicine

## 2019-11-15 ENCOUNTER — Other Ambulatory Visit: Payer: Self-pay

## 2019-11-15 ENCOUNTER — Encounter (HOSPITAL_COMMUNITY): Payer: Self-pay | Admitting: Emergency Medicine

## 2019-11-15 DIAGNOSIS — Z79899 Other long term (current) drug therapy: Secondary | ICD-10-CM | POA: Insufficient documentation

## 2019-11-15 DIAGNOSIS — I10 Essential (primary) hypertension: Secondary | ICD-10-CM | POA: Insufficient documentation

## 2019-11-15 DIAGNOSIS — F17210 Nicotine dependence, cigarettes, uncomplicated: Secondary | ICD-10-CM | POA: Insufficient documentation

## 2019-11-15 DIAGNOSIS — R0981 Nasal congestion: Secondary | ICD-10-CM | POA: Diagnosis not present

## 2019-11-15 DIAGNOSIS — Z590 Homelessness: Secondary | ICD-10-CM | POA: Diagnosis not present

## 2019-11-15 DIAGNOSIS — R0989 Other specified symptoms and signs involving the circulatory and respiratory systems: Secondary | ICD-10-CM

## 2019-11-15 DIAGNOSIS — B182 Chronic viral hepatitis C: Secondary | ICD-10-CM | POA: Diagnosis not present

## 2019-11-15 NOTE — Discharge Instructions (Addendum)
Return here as needed. Follow up at the [facility] °

## 2019-11-15 NOTE — Care Management (Signed)
RNCM consulted regarding pt having multiple visits.  RNCM met with pt at bedside.  RNCM asked pt about sudden multiple ED visits, pt states, "I don't have anywhere to go."  RNCM advised pt to visit [facility] for assistance with homelessness and minor medical needs.  Pt verbalized understanding.

## 2019-11-15 NOTE — ED Triage Notes (Signed)
Patient reports runny nose with nasal congestion and sneezing today , respirations unlabored , no fever or chills .

## 2019-11-18 NOTE — ED Provider Notes (Signed)
[facility] EMERGENCY DEPARTMENT Provider Note   CSN: [id number] Arrival date & time: 11/15/19  0041     History Chief Complaint  Patient presents with  . Allergic Rhinitis     [name] is a 69 y.o. male.  HPI Patient presents to the emergency department with complaints of runny nose.  The patient has been here multiple times recently.  Patient has been seen several times within the last week.  This is a new phenomenon for this patient.  In questioning him further he does not have a home and that is what brought him into the emergency department several times.  The patient states that he has had some runny nose but no other symptoms at this time.  The patient denies chest pain, shortness of breath, headache,blurred vision, neck pain, fever, cough, weakness, numbness, dizziness, anorexia, edema, abdominal pain, nausea, vomiting, diarrhea, rash, back pain, dysuria, hematemesis, bloody stool, near syncope, or syncope.    Past Medical History:  Diagnosis Date  . BPH (benign prostatic hyperplasia)   . Colon polyp 2010  . Depression   . GERD (gastroesophageal reflux disease)   . Hepatitis C    "caught it when I had a blood transfusion"  . High cholesterol   . History of blood transfusion    "when I was young"  . Hypertension   . Paranoid schizophrenia ([place])   . Prostate atrophy   . Retinal vein occlusion     Patient Active Problem List   Diagnosis Date Noted  . GERD (gastroesophageal reflux disease) 05/03/2019  . Hypotension 08/02/2018  . Low back sprain, initial encounter 10/21/2017  . History of drug abuse ([place]) 10/18/2017  . Gingival erythema 09/23/2017  . Chronic pain of left knee 11/24/2016  . Tobacco abuse   . Diastolic heart failure ([place]) 02/05/2015  . Healthcare maintenance 05/28/2014  . Hyperlipidemia 03/10/2014  . Patellar tendonitis 11/16/2012  . [place] OCCLUSION 06/03/2010  . PARANOID SCHIZOPHRENIA, CHRONIC 01/17/2009  .  HYPERTENSION, BENIGN ESSENTIAL 01/17/2009  . BPH (benign prostatic hyperplasia) 01/17/2009  . HEPATITIS C 12/14/2008  . DEPRESSION 12/14/2008    Past Surgical History:  Procedure Laterality Date  . CIRCUMCISION  1959  . TONSILLECTOMY         Family History  Problem Relation Age of Onset  . Hypertension Mother   . Diabetes Mother   . Stroke Mother     Social History   Tobacco Use  . Smoking status: Current Every Day Smoker    Packs/day: 0.50    Years: 48.00    Pack years: 24.00    Types: Cigarettes  . Smokeless tobacco: Never Used  Substance Use Topics  . Alcohol use: No  . Drug use: No    Home Medications Prior to Admission medications   Medication Sig Start Date End Date Taking? Authorizing Provider  acetaminophen (TYLENOL) 500 MG tablet Take 1 tablet (500 mg total) by mouth every 6 (six) hours as needed. 04/23/18   [name], [name], PA-C  amLODipine (NORVASC) 10 MG tablet Take 1 tablet (10 mg total) by mouth daily. 04/13/18   [name], DO  aspirin 81 MG tablet Take 1 tablet (81 mg total) by mouth daily. 09/24/14   [name], MD  atorvastatin (LIPITOR) 40 MG tablet Take 1 tablet (40 mg total) by mouth daily. 08/15/19   [name], DO  azithromycin (ZITHROMAX) 250 MG tablet Take 1 tablet (250 mg total) by mouth daily. 01/17/17  Law, Alexandra M, PA-C  benzonatate (TESSALON) 100 MG capsule Take 1 capsule (100 mg total) by mouth every 8 (eight) hours. 11/05/19   Khatri, Hina, PA-C  benztropine (COGENTIN) 1 MG tablet Take 1 tablet (1 mg total) by mouth 2 (two) times daily. 11/05/16   [name], MD  cetirizine (ZYRTEC ALLERGY) 10 MG tablet Take 1 tablet (10 mg total) by mouth daily. 11/14/19   Blanchie Dessert, MD  diclofenac sodium (VOLTAREN) 1 % GEL Apply 2 g topically 3 (three) times daily as needed. 07/04/18   [name], MD  famotidine (PEPCID) 20 MG tablet Take 1 tablet (20 mg total) by mouth 2 (two) times daily. 05/02/19   [name], DO    fluPHENAZine decanoate (PROLIXIN) 25 MG/ML injection Inject 25 mg into the muscle every 14 (fourteen) days. Last dose 09/07/12    [provider]  fluticasone (FLONASE) 50 MCG/ACT nasal spray Place 1 spray into both nostrils daily. 11/14/19   Blanchie Dessert, MD  loratadine (CLARITIN) 10 MG tablet Take 1 tablet (10 mg total) by mouth daily. 11/09/17   Couture, Cortni S, PA-C  losartan (COZAAR) 100 MG tablet TAKE 1 TABLET BY MOUTH EVERY DAY 12/06/18   [name], DO  Multiple Vitamin (MULTIVITAMIN WITH MINERALS) TABS tablet Take 1 tablet by mouth daily. 06/28/14   [name], MD  tamsulosin (FLOMAX) 0.4 MG CAPS capsule Take 2 capsules (0.8 mg total) by mouth daily. 11/27/17   [name], MD  tamsulosin (FLOMAX) 0.4 MG CAPS capsule TAKE 2 CAPSULE BY MOUTH DAILY 04/17/19   [name], DO  traMADol (ULTRAM) 50 MG tablet Take 1 tablet (50 mg total) by mouth every 6 (six) hours as needed. 07/04/18   [name], MD  triamcinolone cream (KENALOG) 0.1 % Apply 1 application topically 2 (two) times daily. For 5 days, then twice daily as needed for rash and itching. 01/03/15   [name], MD  vitamin C (ASCORBIC ACID) 500 MG tablet Take 500 mg by mouth daily.    [provider]    Allergies    Lisinopril, Penicillins, Amoxicillin, and Ibuprofen  Review of Systems   Review of Systems All other systems negative except as documented in the HPI. All pertinent positives and negatives as reviewed in the HPI. Physical Exam Updated Vital Signs BP (!) 145/99   Pulse 77   Temp 97.8 F (36.6 C) (Oral)   Resp 18   Ht 5\' 7"  (1.702 m)   Wt 80 kg   SpO2 100%   BMI 27.62 kg/m   Physical Exam Vitals and nursing note reviewed.  Constitutional:      General: He is not in acute distress.    Appearance: He is well-developed.  HENT:     Head: Normocephalic and atraumatic.  Eyes:     Pupils: Pupils are equal, round, and reactive to light.   Cardiovascular:     Rate and Rhythm: Normal rate and regular rhythm.     Pulses: Normal pulses.  Pulmonary:     Effort: Pulmonary effort is normal.  Skin:    General: Skin is warm and dry.  Neurological:     Mental Status: He is alert and oriented to person, place, and time.     ED Results / Procedures / Treatments   Labs (all labs ordered are listed, but only abnormal results are displayed) Labs Reviewed - No data to display  EKG None  Radiology No results found.  Procedures Procedures (including critical care  time)  Medications Ordered in ED Medications - No data to display  ED Course  I have reviewed the triage vital signs and the nursing notes.  Pertinent labs & imaging results that were available during my care of the patient were reviewed by me and considered in my medical decision making (see chart for details).    MDM Rules/Calculators/A&P                      Patient has no current significant complaint advised the patient to return here as needed.  We did get him some resources for homeless shelters and follow-up.  Case manager spoke with the patient as well. Final Clinical Impression(s) / ED Diagnoses Final diagnoses:  Runny nose    Rx / DC Orders ED Discharge Orders    None       Dalia Heading, PA-C 11/18/19 [place], [place], DO 11/20/19 [phone]

## 2019-11-19 ENCOUNTER — Other Ambulatory Visit: Payer: Self-pay

## 2019-11-19 ENCOUNTER — Emergency Department (HOSPITAL_COMMUNITY)
Admission: EM | Admit: 2019-11-19 | Discharge: 2019-11-19 | Disposition: A | Discharge status: Home / Self Care | Payer: Medicare Other | Attending: Emergency Medicine | Admitting: Emergency Medicine

## 2019-11-19 DIAGNOSIS — Z7982 Long term (current) use of aspirin: Secondary | ICD-10-CM | POA: Insufficient documentation

## 2019-11-19 DIAGNOSIS — J302 Other seasonal allergic rhinitis: Secondary | ICD-10-CM | POA: Insufficient documentation

## 2019-11-19 DIAGNOSIS — Z79899 Other long term (current) drug therapy: Secondary | ICD-10-CM | POA: Diagnosis not present

## 2019-11-19 DIAGNOSIS — R0981 Nasal congestion: Secondary | ICD-10-CM | POA: Diagnosis present

## 2019-11-19 DIAGNOSIS — I110 Hypertensive heart disease with heart failure: Secondary | ICD-10-CM | POA: Insufficient documentation

## 2019-11-19 DIAGNOSIS — F17210 Nicotine dependence, cigarettes, uncomplicated: Secondary | ICD-10-CM | POA: Diagnosis not present

## 2019-11-19 DIAGNOSIS — I5030 Unspecified diastolic (congestive) heart failure: Secondary | ICD-10-CM | POA: Insufficient documentation

## 2019-11-19 MED ORDER — SALINE SPRAY 0.65 % NA SOLN: 1.0000 | NASAL | 0 refills | Status: DC | PRN

## 2019-11-19 NOTE — ED Triage Notes (Signed)
Nasal congestion and sneezing today

## 2019-11-19 NOTE — ED Provider Notes (Signed)
[facility] EMERGENCY DEPARTMENT Provider Note   CSN: [id number] Arrival date & time: 11/19/19  [id number]     History Chief Complaint  Patient presents with  . Nasal Congestion    [name] is a 69 y.o. male.  Patient presents to the emergency department with a chief complaint of nasal congestion and sneezing.  He states that he has been having the symptoms for the past several days.  He reports using a nose spray for this, but has recently run out.  He denies any fever, chills, or productive cough.  He states that he was having a cough while ago, but this improved.  He denies any other associated symptoms.  The history is provided by the patient. No language interpreter was used.       Past Medical History:  Diagnosis Date  . BPH (benign prostatic hyperplasia)   . Colon polyp 2010  . Depression   . GERD (gastroesophageal reflux disease)   . Hepatitis C    "caught it when I had a blood transfusion"  . High cholesterol   . History of blood transfusion    "when I was young"  . Hypertension   . Paranoid schizophrenia ([place])   . Prostate atrophy   . Retinal vein occlusion     Patient Active Problem List   Diagnosis Date Noted  . GERD (gastroesophageal reflux disease) 05/03/2019  . Hypotension 08/02/2018  . Low back sprain, initial encounter 10/21/2017  . History of drug abuse ([place]) 10/18/2017  . Gingival erythema 09/23/2017  . Chronic pain of left knee 11/24/2016  . Tobacco abuse   . Diastolic heart failure ([place]) 02/05/2015  . Healthcare maintenance 05/28/2014  . Hyperlipidemia 03/10/2014  . Patellar tendonitis 11/16/2012  . [place] OCCLUSION 06/03/2010  . PARANOID SCHIZOPHRENIA, CHRONIC 01/17/2009  . HYPERTENSION, BENIGN ESSENTIAL 01/17/2009  . BPH (benign prostatic hyperplasia) 01/17/2009  . HEPATITIS C 12/14/2008  . DEPRESSION 12/14/2008    Past Surgical History:  Procedure Laterality Date  . CIRCUMCISION  1959  . TONSILLECTOMY          Family History  Problem Relation Age of Onset  . Hypertension Mother   . Diabetes Mother   . Stroke Mother     Social History   Tobacco Use  . Smoking status: Current Every Day Smoker    Packs/day: 0.50    Years: 48.00    Pack years: 24.00    Types: Cigarettes  . Smokeless tobacco: Never Used  Substance Use Topics  . Alcohol use: No  . Drug use: No    Home Medications Prior to Admission medications   Medication Sig Start Date End Date Taking? Authorizing Provider  acetaminophen (TYLENOL) 500 MG tablet Take 1 tablet (500 mg total) by mouth every 6 (six) hours as needed. 04/23/18   [name], [name], PA-C  amLODipine (NORVASC) 10 MG tablet Take 1 tablet (10 mg total) by mouth daily. 04/13/18   [name], DO  aspirin 81 MG tablet Take 1 tablet (81 mg total) by mouth daily. 09/24/14   [name], MD  atorvastatin (LIPITOR) 40 MG tablet Take 1 tablet (40 mg total) by mouth daily. 08/15/19   [name], DO  benztropine (COGENTIN) 1 MG tablet Take 1 tablet (1 mg total) by mouth 2 (two) times daily. 11/05/16   [name], MD  cetirizine (ZYRTEC ALLERGY) 10 MG tablet Take 1 tablet (10 mg total) by mouth daily. 11/14/19   Blanchie Dessert, MD  famotidine (PEPCID) 20 MG tablet Take 1 tablet (20 mg total) by mouth 2 (two) times daily. 05/02/19   [name], DO  fluPHENAZine decanoate (PROLIXIN) 25 MG/ML injection Inject 25 mg into the muscle every 14 (fourteen) days. Last dose 09/07/12    [provider]  losartan (COZAAR) 100 MG tablet TAKE 1 TABLET BY MOUTH EVERY DAY 12/06/18   [name], DO  Multiple Vitamin (MULTIVITAMIN WITH MINERALS) TABS tablet Take 1 tablet by mouth daily. 06/28/14   [name], MD  sodium chloride (OCEAN) 0.65 % SOLN nasal spray Place 1 spray into both nostrils as needed for congestion. 11/19/19   [name], PA-C  tamsulosin (FLOMAX) 0.4 MG CAPS capsule Take 2 capsules (0.8 mg total) by mouth daily. 11/27/17    [name], MD  tamsulosin (FLOMAX) 0.4 MG CAPS capsule TAKE 2 CAPSULE BY MOUTH DAILY 04/17/19   [name], DO  traMADol (ULTRAM) 50 MG tablet Take 1 tablet (50 mg total) by mouth every 6 (six) hours as needed. 07/04/18   [name], MD  triamcinolone cream (KENALOG) 0.1 % Apply 1 application topically 2 (two) times daily. For 5 days, then twice daily as needed for rash and itching. 01/03/15   [name], MD  vitamin C (ASCORBIC ACID) 500 MG tablet Take 500 mg by mouth daily.    [provider]  fluticasone (FLONASE) 50 MCG/ACT nasal spray Place 1 spray into both nostrils daily. 11/14/19 11/19/19  Blanchie Dessert, MD  loratadine (CLARITIN) 10 MG tablet Take 1 tablet (10 mg total) by mouth daily. 11/09/17 11/19/19  Couture, Cortni S, PA-C    Allergies    Lisinopril, Penicillins, Amoxicillin, and Ibuprofen  Review of Systems   Review of Systems  Constitutional: Negative for chills and fever.  HENT: Positive for postnasal drip and rhinorrhea. Negative for sinus pain.   Respiratory: Negative for cough and shortness of breath.     Physical Exam Updated Vital Signs BP (!) 180/98 (BP Location: Left Arm)   Pulse 61   Temp 98.4 F (36.9 C) (Oral)   Resp 18   SpO2 99%   Physical Exam Vitals and nursing note reviewed.  Constitutional:      General: He is not in acute distress.    Appearance: He is well-developed. He is not ill-appearing.  HENT:     Head: Normocephalic and atraumatic.     Nose: Congestion present. No rhinorrhea.     Mouth/Throat:     Mouth: Mucous membranes are moist.  Eyes:     Conjunctiva/sclera: Conjunctivae normal.  Cardiovascular:     Rate and Rhythm: Normal rate and regular rhythm.     Heart sounds: Normal heart sounds.  Pulmonary:     Effort: Pulmonary effort is normal. No respiratory distress.     Breath sounds: Normal breath sounds.  Abdominal:     General: There is no distension.  Musculoskeletal:     Cervical back: Neck  supple.     Comments: Moves all extremities  Skin:    General: Skin is warm and dry.  Neurological:     Mental Status: He is alert and oriented to person, place, and time.  Psychiatric:        Mood and Affect: Mood normal.        Behavior: Behavior normal.     ED Results / Procedures / Treatments   Labs (all labs ordered are listed, but only abnormal results are displayed) Labs Reviewed - No data to display  EKG None  Radiology No results found.  Procedures Procedures (including critical care time)  Medications Ordered in ED Medications - No data to display  ED Course  I have reviewed the triage vital signs and the nursing notes.  Pertinent labs & imaging results that were available during my care of the patient were reviewed by me and considered in my medical decision making (see chart for details).    MDM Rules/Calculators/A&P                      Patient with symptoms consistent with allergic rhinitis, likely secondary to pollen.  He has been on Flonase and has antihistamines at home.  I will switch him over to of some nasal saline to see if this will help clear his sinuses.   Final Clinical Impression(s) / ED Diagnoses Final diagnoses:  Seasonal allergic rhinitis, unspecified trigger    Rx / DC Orders ED Discharge Orders         Ordered    sodium chloride (OCEAN) 0.65 % SOLN nasal spray  As needed     11/19/19 0518           [name], PA-C [id number]    Delora Fuel, MD [id number] [phone]

## 2019-11-20 ENCOUNTER — Encounter (HOSPITAL_COMMUNITY): Payer: Self-pay

## 2019-11-20 ENCOUNTER — Emergency Department (HOSPITAL_COMMUNITY)
Admission: EM | Admit: 2019-11-20 | Discharge: 2019-11-20 | Disposition: A | Discharge status: Home / Self Care | Payer: Medicare Other | Attending: Emergency Medicine | Admitting: Emergency Medicine

## 2019-11-20 ENCOUNTER — Emergency Department (HOSPITAL_COMMUNITY)
Admission: EM | Admit: 2019-11-20 | Discharge: 2019-11-21 | Discharge status: Against Medical Advice | Payer: Medicare Other | Source: Home / Self Care

## 2019-11-20 ENCOUNTER — Other Ambulatory Visit: Payer: Self-pay

## 2019-11-20 ENCOUNTER — Encounter (HOSPITAL_COMMUNITY): Payer: Self-pay | Admitting: *Deleted

## 2019-11-20 DIAGNOSIS — Z590 Homelessness: Secondary | ICD-10-CM | POA: Diagnosis not present

## 2019-11-20 DIAGNOSIS — M7918 Myalgia, other site: Secondary | ICD-10-CM | POA: Diagnosis not present

## 2019-11-20 DIAGNOSIS — Z5321 Procedure and treatment not carried out due to patient leaving prior to being seen by health care provider: Secondary | ICD-10-CM | POA: Insufficient documentation

## 2019-11-20 DIAGNOSIS — M25561 Pain in right knee: Secondary | ICD-10-CM | POA: Diagnosis not present

## 2019-11-20 NOTE — ED Triage Notes (Signed)
C/o rt knee pain from a war injury in the 70s  Speech hard to understand

## 2019-11-20 NOTE — ED Triage Notes (Signed)
Pt arrives to ED w/ c/o 10/10 L knee pain.

## 2019-11-20 NOTE — ED Triage Notes (Signed)
Homeless came in with [name] and [name] at the same time

## 2019-11-20 NOTE — ED Triage Notes (Signed)
The pt is homeless and tonight he is c/o  Pain in his entire body for 8 days. His speech is very difficult to understand ? Normal  The pt has been here every other day in march

## 2019-11-20 NOTE — ED Notes (Addendum)
Pt left to catch the bus

## 2019-11-21 ENCOUNTER — Encounter (HOSPITAL_COMMUNITY): Payer: Self-pay | Admitting: Emergency Medicine

## 2019-11-21 ENCOUNTER — Other Ambulatory Visit: Payer: Self-pay

## 2019-11-21 ENCOUNTER — Emergency Department (HOSPITAL_COMMUNITY)
Admission: EM | Admit: 2019-11-21 | Discharge: 2019-11-22 | Disposition: A | Discharge status: Home / Self Care | Payer: Medicare Other | Attending: Emergency Medicine | Admitting: Emergency Medicine

## 2019-11-21 DIAGNOSIS — F200 Paranoid schizophrenia: Secondary | ICD-10-CM | POA: Diagnosis not present

## 2019-11-21 DIAGNOSIS — I10 Essential (primary) hypertension: Secondary | ICD-10-CM

## 2019-11-21 DIAGNOSIS — Z7982 Long term (current) use of aspirin: Secondary | ICD-10-CM | POA: Insufficient documentation

## 2019-11-21 DIAGNOSIS — M25562 Pain in left knee: Secondary | ICD-10-CM | POA: Diagnosis present

## 2019-11-21 DIAGNOSIS — I5032 Chronic diastolic (congestive) heart failure: Secondary | ICD-10-CM | POA: Diagnosis not present

## 2019-11-21 DIAGNOSIS — Z79899 Other long term (current) drug therapy: Secondary | ICD-10-CM | POA: Insufficient documentation

## 2019-11-21 DIAGNOSIS — F17210 Nicotine dependence, cigarettes, uncomplicated: Secondary | ICD-10-CM | POA: Diagnosis not present

## 2019-11-21 NOTE — ED Triage Notes (Signed)
Chronic left leg pain, denies any injury no deformity noticed.

## 2019-11-21 NOTE — ED Notes (Addendum)
Can not locate pt in lobby or outside of ER

## 2019-11-22 ENCOUNTER — Emergency Department (HOSPITAL_COMMUNITY): Payer: Medicare Other

## 2019-11-22 DIAGNOSIS — M25562 Pain in left knee: Secondary | ICD-10-CM | POA: Diagnosis not present

## 2019-11-22 MED ORDER — ACETAMINOPHEN 325 MG PO TABS
650.0000 mg | ORAL_TABLET | Freq: Once | ORAL | Status: AC
  Administered 2019-11-22: 650 mg via ORAL
  Filled 2019-11-22: qty 2

## 2019-11-22 NOTE — Discharge Instructions (Addendum)
Apply ice as needed.  Take acetaminophen as needed for pain.

## 2019-11-22 NOTE — ED Provider Notes (Signed)
[facility] EMERGENCY DEPARTMENT Provider Note   CSN: [id number] Arrival date & time: 11/21/19  2209   History Chief Complaint  Patient presents with  . Leg Pain    [name] is a 69 y.o. male.  The history is provided by the patient.  Leg Pain He has history of hypertension, hyperlipidemia, paranoid schizophrenia and comes in complaining of left knee pain for the last 2 days.  He states that he had injured his left knee when he was in the [employer] and has had ongoing problems with knee pain since then.  He denies any recent trauma or unusual activity.  He rates his pain at 10/10.  He has not taken any medication for it, but he has applied knee sleeve which does give slight relief.  Past Medical History:  Diagnosis Date  . BPH (benign prostatic hyperplasia)   . Colon polyp 2010  . Depression   . GERD (gastroesophageal reflux disease)   . Hepatitis C    "caught it when I had a blood transfusion"  . High cholesterol   . History of blood transfusion    "when I was young"  . Hypertension   . Paranoid schizophrenia ([place])   . Prostate atrophy   . Retinal vein occlusion     Patient Active Problem List   Diagnosis Date Noted  . GERD (gastroesophageal reflux disease) 05/03/2019  . Hypotension 08/02/2018  . Low back sprain, initial encounter 10/21/2017  . History of drug abuse ([place]) 10/18/2017  . Gingival erythema 09/23/2017  . Chronic pain of left knee 11/24/2016  . Tobacco abuse   . Diastolic heart failure ([place]) 02/05/2015  . Healthcare maintenance 05/28/2014  . Hyperlipidemia 03/10/2014  . Patellar tendonitis 11/16/2012  . [place] OCCLUSION 06/03/2010  . PARANOID SCHIZOPHRENIA, CHRONIC 01/17/2009  . HYPERTENSION, BENIGN ESSENTIAL 01/17/2009  . BPH (benign prostatic hyperplasia) 01/17/2009  . HEPATITIS C 12/14/2008  . DEPRESSION 12/14/2008    Past Surgical History:  Procedure Laterality Date  . CIRCUMCISION  1959  . TONSILLECTOMY           Family History  Problem Relation Age of Onset  . Hypertension Mother   . Diabetes Mother   . Stroke Mother     Social History   Tobacco Use  . Smoking status: Current Every Day Smoker    Packs/day: 0.50    Years: 48.00    Pack years: 24.00    Types: Cigarettes  . Smokeless tobacco: Never Used  Substance Use Topics  . Alcohol use: No  . Drug use: No    Home Medications Prior to Admission medications   Medication Sig Start Date End Date Taking? Authorizing Provider  acetaminophen (TYLENOL) 500 MG tablet Take 1 tablet (500 mg total) by mouth every 6 (six) hours as needed. 04/23/18   [name], [name], PA-C  amLODipine (NORVASC) 10 MG tablet Take 1 tablet (10 mg total) by mouth daily. 04/13/18   [name], DO  aspirin 81 MG tablet Take 1 tablet (81 mg total) by mouth daily. 09/24/14   [name], MD  atorvastatin (LIPITOR) 40 MG tablet Take 1 tablet (40 mg total) by mouth daily. 08/15/19   [name], DO  benztropine (COGENTIN) 1 MG tablet Take 1 tablet (1 mg total) by mouth 2 (two) times daily. 11/05/16   [name], MD  cetirizine (ZYRTEC ALLERGY) 10 MG tablet Take 1 tablet (10 mg total) by mouth daily. 11/14/19   Blanchie Dessert, MD  famotidine (PEPCID) 20 MG tablet Take 1 tablet (20 mg total) by mouth 2 (two) times daily. 05/02/19   [name], DO  fluPHENAZine decanoate (PROLIXIN) 25 MG/ML injection Inject 25 mg into the muscle every 14 (fourteen) days. Last dose 09/07/12    [provider]  losartan (COZAAR) 100 MG tablet TAKE 1 TABLET BY MOUTH EVERY DAY 12/06/18   [name], DO  Multiple Vitamin (MULTIVITAMIN WITH MINERALS) TABS tablet Take 1 tablet by mouth daily. 06/28/14   [name], MD  sodium chloride (OCEAN) 0.65 % SOLN nasal spray Place 1 spray into both nostrils as needed for congestion. 11/19/19   [name], PA-C  tamsulosin (FLOMAX) 0.4 MG CAPS capsule Take 2 capsules (0.8 mg total) by mouth daily. 11/27/17    [name], MD  tamsulosin (FLOMAX) 0.4 MG CAPS capsule TAKE 2 CAPSULE BY MOUTH DAILY 04/17/19   [name], DO  traMADol (ULTRAM) 50 MG tablet Take 1 tablet (50 mg total) by mouth every 6 (six) hours as needed. 07/04/18   [name], MD  triamcinolone cream (KENALOG) 0.1 % Apply 1 application topically 2 (two) times daily. For 5 days, then twice daily as needed for rash and itching. 01/03/15   [name], MD  vitamin C (ASCORBIC ACID) 500 MG tablet Take 500 mg by mouth daily.    [provider]  fluticasone (FLONASE) 50 MCG/ACT nasal spray Place 1 spray into both nostrils daily. 11/14/19 11/19/19  Blanchie Dessert, MD  loratadine (CLARITIN) 10 MG tablet Take 1 tablet (10 mg total) by mouth daily. 11/09/17 11/19/19  Couture, Cortni S, PA-C    Allergies    Lisinopril, Penicillins, Amoxicillin, and Ibuprofen  Review of Systems   Review of Systems  All other systems reviewed and are negative.   Physical Exam Updated Vital Signs BP (!) 149/96 (BP Location: Right Arm)   Pulse 81   Temp 98 F (36.7 C) (Oral)   Resp 20   SpO2 100%   Physical Exam Vitals and nursing note reviewed.   69 year old male, resting comfortably and in no acute distress. Vital signs are significant for elevated blood pressure. Oxygen saturation is 100%, which is normal. Head is normocephalic and atraumatic. PERRLA, EOMI. Oropharynx is clear. Neck is nontender and supple without adenopathy or JVD. Back is nontender and there is no CVA tenderness. Lungs are clear without rales, wheezes, or rhonchi. Chest is nontender. Heart has regular rate and rhythm without murmur. Abdomen is soft, flat, nontender without masses or hepatosplenomegaly and peristalsis is normoactive. Extremities have no cyanosis or edema, full range of motion is present.  No swelling or effusion seen in the left knee.  There is no instability on valgus or varus stress.  Lachman and McMurray's tests are negative. Skin  is warm and dry without rash. Neurologic: Mental status is normal, cranial nerves are intact, there are no motor or sensory deficits.  ED Results / Procedures / Treatments    Radiology DG Knee Complete 4 Views Left  Result Date: 11/22/2019 CLINICAL DATA:  Chronic left leg pain. EXAM: LEFT KNEE - COMPLETE 4+ VIEW COMPARISON:  April 24, 2018 FINDINGS: No evidence of fracture, dislocation, or joint effusion. No evidence of arthropathy or other focal bone abnormality. Soft tissues are unremarkable. IMPRESSION: Negative. Electronically Signed   By: Virgina Norfolk M.D.   On: 11/22/2019 04:06    Procedures Procedures   Medications Ordered in ED Medications  acetaminophen (TYLENOL) tablet 650 mg (650 mg Oral  Given 11/22/19 0317)    ED Course  I have reviewed the triage vital signs and the nursing notes.  Pertinent imaging results that were available during my care of the patient were reviewed by me and considered in my medical decision making (see chart for details).  Exacerbation of chronic left knee pain.  Old records are reviewed, and it is noted that he had several ED visits for chronic left knee pain with last visit for that complaint on 3/18.  No x-ray was were obtained then, last x-ray was in 2019.  This is his 12th ED visit since March 17.  He is given a dose of acetaminophen, anticipate orthopedic referral.  X-rays are unremarkable, images viewed independently by me.  He is advised to apply ice as needed and use over-the-counter analgesics as needed.  He is referred back to his primary care provider and orthopedics.  MDM Rules/Calculators/A&P  Final Clinical Impression(s) / ED Diagnoses Final diagnoses:  Pain in joint of left knee  Elevated blood pressure reading with diagnosis of hypertension    Rx / DC Orders ED Discharge Orders    None       Delora Fuel, MD [id number]

## 2019-11-24 ENCOUNTER — Emergency Department (HOSPITAL_COMMUNITY)
Admission: EM | Admit: 2019-11-24 | Discharge: 2019-11-25 | Disposition: A | Discharge status: Home / Self Care | Payer: Medicare Other | Attending: Emergency Medicine | Admitting: Emergency Medicine

## 2019-11-24 ENCOUNTER — Other Ambulatory Visit: Payer: Self-pay

## 2019-11-24 ENCOUNTER — Encounter (HOSPITAL_COMMUNITY): Payer: Self-pay | Admitting: Emergency Medicine

## 2019-11-24 DIAGNOSIS — F200 Paranoid schizophrenia: Secondary | ICD-10-CM | POA: Diagnosis not present

## 2019-11-24 DIAGNOSIS — J301 Allergic rhinitis due to pollen: Secondary | ICD-10-CM | POA: Insufficient documentation

## 2019-11-24 DIAGNOSIS — I10 Essential (primary) hypertension: Secondary | ICD-10-CM | POA: Insufficient documentation

## 2019-11-24 DIAGNOSIS — Z79899 Other long term (current) drug therapy: Secondary | ICD-10-CM | POA: Diagnosis not present

## 2019-11-24 DIAGNOSIS — J309 Allergic rhinitis, unspecified: Secondary | ICD-10-CM | POA: Diagnosis present

## 2019-11-24 DIAGNOSIS — F17210 Nicotine dependence, cigarettes, uncomplicated: Secondary | ICD-10-CM | POA: Insufficient documentation

## 2019-11-24 MED ORDER — FLUTICASONE PROPIONATE 50 MCG/ACT NA SUSP: 2.0000 | Freq: Every day | NASAL | 0 refills | Status: DC

## 2019-11-24 MED ORDER — FLUTICASONE PROPIONATE 50 MCG/ACT NA SUSP
1.0000 | Freq: Once | NASAL | Status: AC
  Administered 2019-11-25: 1 via NASAL
  Filled 2019-11-24: qty 16

## 2019-11-24 MED ORDER — AMLODIPINE BESYLATE 5 MG PO TABS
10.0000 mg | ORAL_TABLET | Freq: Once | ORAL | Status: AC
  Administered 2019-11-25: 10 mg via ORAL
  Filled 2019-11-24: qty 2

## 2019-11-24 NOTE — ED Provider Notes (Signed)
[facility] EMERGENCY DEPARTMENT Provider Note   CSN: [id number] Arrival date & time: 11/24/19  2200     History Chief Complaint  Patient presents with  . Allergic Rhinitis     [name] is a 69 y.o. male with a hx of hypertension, hyperlipidemia, paranoid schizophrenia presents to the Emergency Department complaining of gradual, persistent, progressively worsening itchy nose and sneezing onset yesterday.  Additionally, patient reports [place] and he got wet and he is hungry therefore he came to the emergency department.  No treatments prior to arrival.  No specific aggravating or alleviating factors for his itchiness.   The history is provided by the patient and medical records. No language interpreter was used.       Past Medical History:  Diagnosis Date  . BPH (benign prostatic hyperplasia)   . Colon polyp 2010  . Depression   . GERD (gastroesophageal reflux disease)   . Hepatitis C    "caught it when I had a blood transfusion"  . High cholesterol   . History of blood transfusion    "when I was young"  . Hypertension   . Paranoid schizophrenia ([place])   . Prostate atrophy   . Retinal vein occlusion     Patient Active Problem List   Diagnosis Date Noted  . GERD (gastroesophageal reflux disease) 05/03/2019  . Hypotension 08/02/2018  . Low back sprain, initial encounter 10/21/2017  . History of drug abuse ([place]) 10/18/2017  . Gingival erythema 09/23/2017  . Chronic pain of left knee 11/24/2016  . Tobacco abuse   . Diastolic heart failure ([place]) 02/05/2015  . Healthcare maintenance 05/28/2014  . Hyperlipidemia 03/10/2014  . Patellar tendonitis 11/16/2012  . [place] OCCLUSION 06/03/2010  . PARANOID SCHIZOPHRENIA, CHRONIC 01/17/2009  . HYPERTENSION, BENIGN ESSENTIAL 01/17/2009  . BPH (benign prostatic hyperplasia) 01/17/2009  . HEPATITIS C 12/14/2008  . DEPRESSION 12/14/2008    Past  Surgical History:  Procedure Laterality Date  . CIRCUMCISION  1959  . TONSILLECTOMY         Family History  Problem Relation Age of Onset  . Hypertension Mother   . Diabetes Mother   . Stroke Mother     Social History   Tobacco Use  . Smoking status: Current Every Day Smoker    Packs/day: 0.50    Years: 48.00    Pack years: 24.00    Types: Cigarettes  . Smokeless tobacco: Never Used  Substance Use Topics  . Alcohol use: No  . Drug use: No    Home Medications Prior to Admission medications   Medication Sig Start Date End Date Taking? Authorizing Provider  acetaminophen (TYLENOL) 500 MG tablet Take 1 tablet (500 mg total) by mouth every 6 (six) hours as needed. 04/23/18   [name], [name], PA-C  amLODipine (NORVASC) 10 MG tablet Take 1 tablet (10 mg total) by mouth daily. 04/13/18   [name], DO  aspirin 81 MG tablet Take 1 tablet (81 mg total) by mouth daily. 09/24/14   [name], MD  atorvastatin (LIPITOR) 40 MG tablet Take 1 tablet (40 mg total) by mouth daily. 08/15/19   [name], DO  benztropine (COGENTIN) 1 MG tablet Take 1 tablet (1 mg total) by mouth 2 (two) times daily. 11/05/16   [name], MD  cetirizine (ZYRTEC ALLERGY) 10 MG tablet Take 1 tablet (10 mg total) by mouth daily. 11/14/19   Blanchie Dessert, MD  famotidine (PEPCID) 20 MG tablet Take 1 tablet (20 mg total) by mouth 2 (two) times daily. 05/02/19   [name], DO  fluPHENAZine decanoate (PROLIXIN) 25 MG/ML injection Inject 25 mg into the muscle every 14 (fourteen) days. Last dose 09/07/12    [provider]  fluticasone (FLONASE) 50 MCG/ACT nasal spray Place 2 sprays into both nostrils daily. 11/24/19   Adelma Bowdoin, Jarrett Soho, PA-C  losartan (COZAAR) 100 MG tablet TAKE 1 TABLET BY MOUTH EVERY DAY 12/06/18   [name], DO  Multiple Vitamin (MULTIVITAMIN WITH MINERALS) TABS tablet Take 1 tablet by mouth daily. 06/28/14   [name], MD  sodium chloride (OCEAN)  0.65 % SOLN nasal spray Place 1 spray into both nostrils as needed for congestion. 11/19/19   [name], PA-C  tamsulosin (FLOMAX) 0.4 MG CAPS capsule Take 2 capsules (0.8 mg total) by mouth daily. 11/27/17   [name], MD  tamsulosin (FLOMAX) 0.4 MG CAPS capsule TAKE 2 CAPSULE BY MOUTH DAILY 04/17/19   [name], DO  traMADol (ULTRAM) 50 MG tablet Take 1 tablet (50 mg total) by mouth every 6 (six) hours as needed. 07/04/18   [name], MD  triamcinolone cream (KENALOG) 0.1 % Apply 1 application topically 2 (two) times daily. For 5 days, then twice daily as needed for rash and itching. 01/03/15   [name], MD  vitamin C (ASCORBIC ACID) 500 MG tablet Take 500 mg by mouth daily.    [provider]  loratadine (CLARITIN) 10 MG tablet Take 1 tablet (10 mg total) by mouth daily. 11/09/17 11/19/19  Couture, Cortni S, PA-C    Allergies    Lisinopril, Penicillins, Amoxicillin, and Ibuprofen  Review of Systems   Review of Systems  Constitutional: Negative for fever.  HENT: Positive for congestion, rhinorrhea and sneezing.   Eyes: Negative for pain and itching.  Respiratory: Negative for cough, chest tightness and shortness of breath.   Cardiovascular: Negative for chest pain.  Gastrointestinal: Negative for abdominal pain, nausea and vomiting.  Musculoskeletal: Negative for neck pain.  Skin: Negative for rash.  Allergic/Immunologic: Negative for immunocompromised state.  Neurological: Negative for headaches.  Hematological: Negative for adenopathy.  Psychiatric/Behavioral: The patient is not nervous/anxious.     Physical Exam Updated Vital Signs BP (!) 185/106 (BP Location: Left Arm)   Pulse 60   Temp 98 F (36.7 C) (Oral)   Resp 20   Ht 5\' 8"  (1.727 m)   Wt 80 kg   SpO2 100%   BMI 26.82 kg/m   Physical Exam Vitals and nursing note reviewed.  Constitutional:      General: He is not in acute distress.    Appearance: He is not diaphoretic.   HENT:     Head: Normocephalic and atraumatic.     Jaw: There is normal jaw occlusion.     Right Ear: Tympanic membrane, ear canal and external ear normal.     Left Ear: Tympanic membrane, ear canal and external ear normal.     Nose: Mucosal edema and rhinorrhea present. No congestion. Rhinorrhea is clear.     Right Turbinates: Swollen.     Left Turbinates: Swollen.     Mouth/Throat:     Lips: Pink.     Mouth: Mucous membranes are moist.     Tongue: No lesions.     Pharynx: Oropharynx is clear. No pharyngeal swelling or posterior oropharyngeal erythema.  Eyes:     General: No scleral icterus.    Conjunctiva/sclera: Conjunctivae  normal.  Cardiovascular:     Rate and Rhythm: Normal rate and regular rhythm.     Pulses: Normal pulses.          Radial pulses are 2+ on the right side and 2+ on the left side.  Pulmonary:     Effort: No tachypnea, accessory muscle usage, prolonged expiration, respiratory distress or retractions.     Breath sounds: No stridor.     Comments: Equal chest rise. No increased work of breathing. Abdominal:     General: There is no distension.     Palpations: Abdomen is soft.     Tenderness: There is no abdominal tenderness. There is no guarding or rebound.  Musculoskeletal:     Cervical back: Normal range of motion.     Comments: Moves all extremities equally and without difficulty.  Skin:    General: Skin is warm and dry.     Capillary Refill: Capillary refill takes less than 2 seconds.  Neurological:     Mental Status: He is alert.     GCS: GCS eye subscore is 4. GCS verbal subscore is 5. GCS motor subscore is 6.     Comments: Speech is clear and goal oriented.  Psychiatric:        Mood and Affect: Mood normal.     ED Results / Procedures / Treatments    Procedures Procedures (including critical care time)  Medications Ordered in ED Medications  fluticasone (FLONASE) 50 MCG/ACT nasal spray 1 spray (has no administration in time range)   amLODipine (NORVASC) tablet 10 mg (has no administration in time range)    ED Course  I have reviewed the triage vital signs and the nursing notes.  Pertinent labs & imaging results that were available during my care of the patient were reviewed by me and considered in my medical decision making (see chart for details).    MDM Rules/Calculators/A&P                       Well-appearing complaining of allergic rhinitis.  Suspect he may also be here due to the fact that it is raining outside.  Exam reassuring.  Hypertension noted here in the emergency department.  History of same.  He has not been taking his medications.  No chest pain or shortness of breath.  Home meds given.  Will give Flonase here in the emergency department.     Final Clinical Impression(s) / ED Diagnoses Final diagnoses:  Seasonal allergic rhinitis due to pollen  Hypertension, unspecified type    Rx / DC Orders ED Discharge Orders         Ordered    fluticasone (FLONASE) 50 MCG/ACT nasal spray  Daily     11/24/19 2331           Krystall Kruckenberg, Gwenlyn Perking 11/24/19 2332    Charlesetta Shanks, MD 12/04/19 [phone]

## 2019-11-24 NOTE — Discharge Instructions (Addendum)
1. Medications: flonse, take your usual home medications as prescribed 2. Treatment: rest, drink plenty of fluids, 3. Follow Up: Please followup with your primary doctor in 2-3 days for discussion of your diagnoses and further evaluation after today's visit; if you do not have a primary care doctor use the resource guide provided to find one; Please return to the ER for new or worsening symptoms including difficulty breathing, chest pain, shortness of breath or other concerns.

## 2019-11-24 NOTE — ED Triage Notes (Signed)
Patient reports nasal congestion/runny nose and allergies onset this week .

## 2019-11-25 DIAGNOSIS — J301 Allergic rhinitis due to pollen: Secondary | ICD-10-CM | POA: Diagnosis not present

## 2019-11-25 NOTE — ED Notes (Signed)
The pt was asleep  Now waiting on med from pharmacy

## 2019-11-26 ENCOUNTER — Other Ambulatory Visit: Payer: Self-pay

## 2019-11-26 ENCOUNTER — Emergency Department (HOSPITAL_COMMUNITY)
Admission: EM | Admit: 2019-11-26 | Discharge: 2019-11-26 | Disposition: A | Discharge status: Home / Self Care | Payer: Medicare Other | Attending: Emergency Medicine | Admitting: Emergency Medicine

## 2019-11-26 DIAGNOSIS — J301 Allergic rhinitis due to pollen: Secondary | ICD-10-CM | POA: Diagnosis not present

## 2019-11-26 DIAGNOSIS — R0981 Nasal congestion: Secondary | ICD-10-CM | POA: Diagnosis present

## 2019-11-26 MED ORDER — LORATADINE 10 MG PO TABS
10.0000 mg | ORAL_TABLET | Freq: Once | ORAL | Status: AC
  Administered 2019-11-26: 02:00:00 10 mg via ORAL
  Filled 2019-11-26: qty 1

## 2019-11-26 MED ORDER — FLUTICASONE PROPIONATE 50 MCG/ACT NA SUSP
2.0000 | Freq: Once | NASAL | Status: AC
  Administered 2019-11-26: 2 via NASAL
  Filled 2019-11-26: qty 16

## 2019-11-26 NOTE — ED Notes (Signed)
Pt verbalized understanding of d/c instructions, follow up care and s/s requiring return to ED. Pt had no further questions at this time and was transported to exit via wheelchair.

## 2019-11-26 NOTE — ED Triage Notes (Signed)
Per pt he was at [employer] and called ems bc he had nasal congestion. No fevers, no chills
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
## 2019-11-26 NOTE — Discharge Instructions (Addendum)
1. Medications: Flonase, Claritin, usual home medications 2. Treatment: rest, drink plenty of fluids,  3. Follow Up: Please followup with your primary doctor in 3 days for discussion of your diagnoses and further evaluation after today's visit; if you do not have a primary care doctor use the resource guide provided to find one; Please return to the ER for new or worsening symptoms

## 2019-11-28 ENCOUNTER — Other Ambulatory Visit: Payer: Self-pay

## 2019-11-28 ENCOUNTER — Emergency Department (HOSPITAL_COMMUNITY)
Admission: EM | Admit: 2019-11-28 | Discharge: 2019-11-29 | Disposition: A | Discharge status: Home / Self Care | Payer: Medicare Other | Source: Home / Self Care | Attending: Emergency Medicine | Admitting: Emergency Medicine

## 2019-11-28 ENCOUNTER — Emergency Department (HOSPITAL_COMMUNITY)
Admission: EM | Admit: 2019-11-28 | Discharge: 2019-11-28 | Disposition: A | Discharge status: Home / Self Care | Payer: Medicare Other | Attending: Emergency Medicine | Admitting: Emergency Medicine

## 2019-11-28 ENCOUNTER — Encounter (HOSPITAL_COMMUNITY): Payer: Self-pay | Admitting: Emergency Medicine

## 2019-11-28 ENCOUNTER — Other Ambulatory Visit: Payer: Self-pay | Admitting: Family Medicine

## 2019-11-28 DIAGNOSIS — M25561 Pain in right knee: Secondary | ICD-10-CM | POA: Diagnosis present

## 2019-11-28 DIAGNOSIS — F17210 Nicotine dependence, cigarettes, uncomplicated: Secondary | ICD-10-CM | POA: Insufficient documentation

## 2019-11-28 DIAGNOSIS — Z7982 Long term (current) use of aspirin: Secondary | ICD-10-CM | POA: Insufficient documentation

## 2019-11-28 DIAGNOSIS — M25562 Pain in left knee: Secondary | ICD-10-CM | POA: Diagnosis not present

## 2019-11-28 DIAGNOSIS — Z5321 Procedure and treatment not carried out due to patient leaving prior to being seen by health care provider: Secondary | ICD-10-CM | POA: Insufficient documentation

## 2019-11-28 DIAGNOSIS — Z79899 Other long term (current) drug therapy: Secondary | ICD-10-CM | POA: Insufficient documentation

## 2019-11-28 DIAGNOSIS — R0981 Nasal congestion: Secondary | ICD-10-CM

## 2019-11-28 DIAGNOSIS — I10 Essential (primary) hypertension: Secondary | ICD-10-CM | POA: Insufficient documentation

## 2019-11-28 MED ORDER — LOSARTAN POTASSIUM 100 MG PO TABS: 100.0000 mg | ORAL_TABLET | Freq: Every day | ORAL | 1 refills | Status: DC

## 2019-11-28 NOTE — Telephone Encounter (Signed)
Patient calling to request refill of: losartan  Name of Medication(s): losartan  Last date of OV:  11-14-19 (TELEMEDICINE) Pharmacy: [facility].   Will route refill request to Clinic RN.  Discussed with patient policy to call pharmacy for future refills.  Also, discussed refills may take up to 48 hours to approve or deny.  Richrd Humbles

## 2019-11-28 NOTE — ED Triage Notes (Signed)
Pt in POV. Reports bilateral knee pain. Ambulatory. NAD

## 2019-11-28 NOTE — ED Triage Notes (Signed)
Patient reports nasal congestion/runny nose this week , denies SOB , no cough or fever .

## 2019-11-29 ENCOUNTER — Encounter (HOSPITAL_COMMUNITY): Payer: Self-pay

## 2019-11-29 ENCOUNTER — Emergency Department (HOSPITAL_COMMUNITY)
Admission: EM | Admit: 2019-11-29 | Discharge: 2019-11-30 | Disposition: A | Discharge status: Home / Self Care | Payer: Medicare Other | Attending: Emergency Medicine | Admitting: Emergency Medicine

## 2019-11-29 DIAGNOSIS — R05 Cough: Secondary | ICD-10-CM | POA: Insufficient documentation

## 2019-11-29 DIAGNOSIS — R067 Sneezing: Secondary | ICD-10-CM | POA: Insufficient documentation

## 2019-11-29 DIAGNOSIS — Z5321 Procedure and treatment not carried out due to patient leaving prior to being seen by health care provider: Secondary | ICD-10-CM | POA: Insufficient documentation

## 2019-11-29 NOTE — Discharge Instructions (Addendum)
You may use over-the-counter nasal saline and Flonase as needed for nasal congestion.  Steps to find a Primary Care Provider (PCP):  Call [phone] or [phone] to access "[facility] a Doctor Service."  2.  You may also go on the [facility] website at [email or web address]  3.  [facility] also frequently accepts new patients.  [place] [phone]  4.  There are also multiple [facility], [facility] and [facility] practices throughout the [place] that are frequently accepting new patients. You may find a clinic that is close to your home and contact them.  [facility] [email or web address] [phone]  [facility] [email or web address]  [facility] [email or web address] [email or web address] [phone]  5.  Local Health Departments also can provide primary care services.  [facility]  [place] [address] [phone]  [facility] [place] [address] [place] [facility] [place] [phone]

## 2019-11-29 NOTE — ED Triage Notes (Signed)
Pt reports that he took the covid vaccine yesterday and now "the covid is coming out of him" states he has a cough and sneezing

## 2019-11-29 NOTE — ED Notes (Signed)
Discharge instructions discussed with pt. Pt verbalized understanding with no questions at this time.  
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
## 2019-11-30 ENCOUNTER — Other Ambulatory Visit: Payer: Self-pay

## 2019-11-30 ENCOUNTER — Emergency Department (HOSPITAL_COMMUNITY)
Admission: EM | Admit: 2019-11-30 | Discharge: 2019-12-01 | Disposition: A | Discharge status: Home / Self Care | Payer: Medicare Other | Attending: Emergency Medicine | Admitting: Emergency Medicine

## 2019-11-30 DIAGNOSIS — Z5321 Procedure and treatment not carried out due to patient leaving prior to being seen by health care provider: Secondary | ICD-10-CM | POA: Diagnosis not present

## 2019-11-30 DIAGNOSIS — R0981 Nasal congestion: Secondary | ICD-10-CM | POA: Diagnosis present

## 2019-11-30 NOTE — ED Triage Notes (Signed)
Pt c/o nasal congestion for the past few days.  

## 2019-11-30 NOTE — ED Notes (Signed)
Pt called but did not respond, not seen outside lobby either

## 2019-12-01 ENCOUNTER — Encounter (HOSPITAL_COMMUNITY): Payer: Self-pay | Admitting: Emergency Medicine

## 2019-12-01 NOTE — ED Provider Notes (Signed)
Pt LWBS, I was unable to assess pt.   Domenic Moras, PA-C 12/01/19 [place], [place], MD 12/01/19 [phone]

## 2019-12-03 ENCOUNTER — Other Ambulatory Visit: Payer: Self-pay

## 2019-12-03 ENCOUNTER — Emergency Department (HOSPITAL_COMMUNITY)
Admission: EM | Admit: 2019-12-03 | Discharge: 2019-12-03 | Disposition: A | Discharge status: Home / Self Care | Payer: Medicare Other | Attending: Emergency Medicine | Admitting: Emergency Medicine

## 2019-12-03 ENCOUNTER — Encounter (HOSPITAL_COMMUNITY): Payer: Self-pay | Admitting: Emergency Medicine

## 2019-12-03 DIAGNOSIS — F17210 Nicotine dependence, cigarettes, uncomplicated: Secondary | ICD-10-CM | POA: Insufficient documentation

## 2019-12-03 DIAGNOSIS — I5030 Unspecified diastolic (congestive) heart failure: Secondary | ICD-10-CM | POA: Diagnosis not present

## 2019-12-03 DIAGNOSIS — R0981 Nasal congestion: Secondary | ICD-10-CM | POA: Insufficient documentation

## 2019-12-03 DIAGNOSIS — I110 Hypertensive heart disease with heart failure: Secondary | ICD-10-CM | POA: Insufficient documentation

## 2019-12-03 DIAGNOSIS — I10 Essential (primary) hypertension: Secondary | ICD-10-CM | POA: Insufficient documentation

## 2019-12-03 DIAGNOSIS — Z7982 Long term (current) use of aspirin: Secondary | ICD-10-CM | POA: Diagnosis not present

## 2019-12-03 DIAGNOSIS — Z79899 Other long term (current) drug therapy: Secondary | ICD-10-CM | POA: Insufficient documentation

## 2019-12-03 DIAGNOSIS — M25562 Pain in left knee: Secondary | ICD-10-CM | POA: Insufficient documentation

## 2019-12-03 NOTE — ED Provider Notes (Signed)
[place] EMERGENCY DEPARTMENT Provider Note   CSN: [id number] Arrival date & time: 12/03/19  0108     History Chief Complaint  Patient presents with  . Nasal Congestion    [name] is a 69 y.o. male.  Patient with hx of hypertension, hyperlipidemia, paranoid schizophrenia presents to the Emergency Department this morning stating "I am just trying to get straight".  When asked what is bothering him today, he states that he has been having knee pain.  Nursing note states that patient was complaining of nasal congestion and when patient specifically ask, he endorses this as well.  He denies being on any medications.  He denies any fevers, headache, vomiting.  He is able to walk.  His speech is disorganized and rambling, however he does not verbalize any other complaints today.        Past Medical History:  Diagnosis Date  . BPH (benign prostatic hyperplasia)   . Colon polyp 2010  . Depression   . GERD (gastroesophageal reflux disease)   . Hepatitis C    "caught it when I had a blood transfusion"  . High cholesterol   . History of blood transfusion    "when I was young"  . Hypertension   . Paranoid schizophrenia ([place])   . Prostate atrophy   . Retinal vein occlusion     Patient Active Problem List   Diagnosis Date Noted  . GERD (gastroesophageal reflux disease) 05/03/2019  . Hypotension 08/02/2018  . Low back sprain, initial encounter 10/21/2017  . History of drug abuse ([place]) 10/18/2017  . Gingival erythema 09/23/2017  . Chronic pain of left knee 11/24/2016  . Tobacco abuse   . Diastolic heart failure ([place]) 02/05/2015  . Healthcare maintenance 05/28/2014  . Hyperlipidemia 03/10/2014  . Patellar tendonitis 11/16/2012  . [place] OCCLUSION 06/03/2010  . PARANOID SCHIZOPHRENIA, CHRONIC 01/17/2009  . HYPERTENSION, BENIGN ESSENTIAL 01/17/2009  . BPH (benign prostatic hyperplasia) 01/17/2009  . HEPATITIS C 12/14/2008  . DEPRESSION  12/14/2008    Past Surgical History:  Procedure Laterality Date  . CIRCUMCISION  1959  . TONSILLECTOMY         Family History  Problem Relation Age of Onset  . Hypertension Mother   . Diabetes Mother   . Stroke Mother     Social History   Tobacco Use  . Smoking status: Current Every Day Smoker    Packs/day: 0.50    Years: 48.00    Pack years: 24.00    Types: Cigarettes  . Smokeless tobacco: Never Used  Substance Use Topics  . Alcohol use: No  . Drug use: No    Home Medications Prior to Admission medications   Medication Sig Start Date End Date Taking? Authorizing Provider  acetaminophen (TYLENOL) 500 MG tablet Take 1 tablet (500 mg total) by mouth every 6 (six) hours as needed. 04/23/18   [name], [name], PA-C  amLODipine (NORVASC) 10 MG tablet Take 1 tablet (10 mg total) by mouth daily. 04/13/18   [name], DO  aspirin 81 MG tablet Take 1 tablet (81 mg total) by mouth daily. 09/24/14   [name], MD  atorvastatin (LIPITOR) 40 MG tablet Take 1 tablet (40 mg total) by mouth daily. 08/15/19   [name], DO  benztropine (COGENTIN) 1 MG tablet Take 1 tablet (1 mg total) by mouth 2 (two) times daily. 11/05/16   [name], MD  cetirizine (ZYRTEC ALLERGY) 10 MG tablet Take 1 tablet (10 mg  total) by mouth daily. 11/14/19   Blanchie Dessert, MD  famotidine (PEPCID) 20 MG tablet Take 1 tablet (20 mg total) by mouth 2 (two) times daily. 05/02/19   [name], DO  fluPHENAZine decanoate (PROLIXIN) 25 MG/ML injection Inject 25 mg into the muscle every 14 (fourteen) days. Last dose 09/07/12    [provider]  fluticasone (FLONASE) 50 MCG/ACT nasal spray Place 2 sprays into both nostrils daily. 11/24/19   Muthersbaugh, Jarrett Soho, PA-C  losartan (COZAAR) 100 MG tablet Take 1 tablet (100 mg total) by mouth daily. 11/28/19   [name], DO  Multiple Vitamin (MULTIVITAMIN WITH MINERALS) TABS tablet Take 1 tablet by mouth daily. 06/28/14   [name], MD  sodium chloride (OCEAN) 0.65 % SOLN nasal spray Place 1 spray into both nostrils as needed for congestion. 11/19/19   [name], PA-C  tamsulosin (FLOMAX) 0.4 MG CAPS capsule Take 2 capsules (0.8 mg total) by mouth daily. 11/27/17   [name], MD  tamsulosin (FLOMAX) 0.4 MG CAPS capsule TAKE 2 CAPSULE BY MOUTH DAILY 04/17/19   [name], DO  traMADol (ULTRAM) 50 MG tablet Take 1 tablet (50 mg total) by mouth every 6 (six) hours as needed. 07/04/18   [name], MD  triamcinolone cream (KENALOG) 0.1 % Apply 1 application topically 2 (two) times daily. For 5 days, then twice daily as needed for rash and itching. 01/03/15   [name], MD  vitamin C (ASCORBIC ACID) 500 MG tablet Take 500 mg by mouth daily.    [provider]  loratadine (CLARITIN) 10 MG tablet Take 1 tablet (10 mg total) by mouth daily. 11/09/17 11/19/19  Couture, Cortni S, PA-C    Allergies    Lisinopril, Penicillins, Amoxicillin, and Ibuprofen  Review of Systems   Review of Systems  Constitutional: Negative for chills, fatigue and fever.  HENT: Positive for congestion. Negative for ear pain, rhinorrhea, sinus pressure and sore throat.   Eyes: Negative for redness.  Respiratory: Negative for cough and wheezing.   Gastrointestinal: Negative for abdominal pain, diarrhea, nausea and vomiting.  Genitourinary: Negative for dysuria.  Musculoskeletal: Positive for arthralgias. Negative for myalgias and neck stiffness.  Skin: Negative for rash.  Neurological: Negative for headaches.  Hematological: Negative for adenopathy.    Physical Exam Updated Vital Signs BP (!) 154/89 (BP Location: Right Arm)   Pulse 70   Temp 97.9 F (36.6 C) (Oral)   Resp 15   Ht 5\' 8"  (1.727 m)   SpO2 100%   BMI 23.72 kg/m   Physical Exam Vitals and nursing note reviewed.  Constitutional:      Appearance: He is well-developed.     Comments: Patient disheveled.  Sleeping in hallway bed when  approached.  He readily responds to voice.  HENT:     Head: Normocephalic and atraumatic.     Right Ear: Tympanic membrane, ear canal and external ear normal.     Left Ear: Tympanic membrane, ear canal and external ear normal.     Nose: Congestion present.     Comments: Turbinates are inflamed bilaterally    Mouth/Throat:     Mouth: Mucous membranes are moist.  Eyes:     Conjunctiva/sclera: Conjunctivae normal.  Pulmonary:     Effort: No respiratory distress.  Musculoskeletal:     Cervical back: Normal range of motion and neck supple.     Comments: Patient with normal active range of motion of bilateral knees without any effusion or signs of  trauma.  Skin:    General: Skin is warm and dry.  Neurological:     Mental Status: He is alert.  Psychiatric:     Comments: Speech is disorganized and rambling however patient does not seem to be responding to any internal stimulus.     ED Results / Procedures / Treatments   Labs (all labs ordered are listed, but only abnormal results are displayed) Labs Reviewed - No data to display  EKG None  Radiology No results found.  Procedures Procedures (including critical care time)  Medications Ordered in ED Medications - No data to display  ED Course  I have reviewed the triage vital signs and the nursing notes.  Pertinent labs & imaging results that were available during my care of the patient were reviewed by me and considered in my medical decision making (see chart for details).  Patient seen and examined.  Patient with frequent emergency department visits and history of homelessness.  He does not have any obvious acute life-threatening medical complaints today.  He has been seen for nasal congestion multiple times and provided Flonase.  I encouraged him to follow-up with primary care doctor for further management.  Regarding his knees, no signs of trauma, full range of motion.  Exam is reassuring.  Vital signs reviewed and are as  follows: BP (!) 154/89 (BP Location: Right Arm)   Pulse 70   Temp 97.9 F (36.6 C) (Oral)   Resp 15   Ht 5\' 8"  (1.727 m)   SpO2 100%   BMI 23.72 kg/m      MDM Rules/Calculators/A&P                         Final Clinical Impression(s) / ED Diagnoses Final diagnoses:  Nasal congestion  Left knee pain, unspecified chronicity    Rx / DC Orders ED Discharge Orders    None       Carlisle Cater, PA-C 12/03/19 [place], MD 12/05/19 1230

## 2019-12-03 NOTE — ED Triage Notes (Signed)
Pt reports nasal congestion.  No other changes.

## 2019-12-04 ENCOUNTER — Other Ambulatory Visit: Payer: Self-pay

## 2019-12-04 ENCOUNTER — Emergency Department (HOSPITAL_COMMUNITY)
Admission: EM | Admit: 2019-12-04 | Discharge: 2019-12-04 | Disposition: A | Discharge status: Home / Self Care | Payer: Medicare Other | Attending: Emergency Medicine | Admitting: Emergency Medicine

## 2019-12-04 DIAGNOSIS — R0981 Nasal congestion: Secondary | ICD-10-CM | POA: Insufficient documentation

## 2019-12-04 DIAGNOSIS — Z5900 Homelessness unspecified: Secondary | ICD-10-CM

## 2019-12-04 DIAGNOSIS — Z79899 Other long term (current) drug therapy: Secondary | ICD-10-CM | POA: Insufficient documentation

## 2019-12-04 DIAGNOSIS — R05 Cough: Secondary | ICD-10-CM | POA: Insufficient documentation

## 2019-12-04 DIAGNOSIS — I5030 Unspecified diastolic (congestive) heart failure: Secondary | ICD-10-CM | POA: Diagnosis not present

## 2019-12-04 DIAGNOSIS — Z7982 Long term (current) use of aspirin: Secondary | ICD-10-CM | POA: Insufficient documentation

## 2019-12-04 DIAGNOSIS — F17210 Nicotine dependence, cigarettes, uncomplicated: Secondary | ICD-10-CM | POA: Insufficient documentation

## 2019-12-04 DIAGNOSIS — I110 Hypertensive heart disease with heart failure: Secondary | ICD-10-CM | POA: Insufficient documentation

## 2019-12-04 NOTE — ED Notes (Signed)
Patient verbalizes understanding of discharge instructions. Opportunity for questioning and answers were provided. Armband removed by staff, pt discharged from ED.  

## 2019-12-04 NOTE — ED Provider Notes (Signed)
[place] EMERGENCY DEPARTMENT Provider Note   CSN: [id number] Arrival date & time: 12/04/19  0217     History Chief Complaint  Patient presents with  . Nasal Congestion    [name] is a 69 y.o. male.  HPI Reports he still has nasal congestion.  He reports he is taking the Flonase and Allegra as prescribed without relief.  No fevers, no facial pain.  Occasional cough.  No chest pain, no abdominal pain, no vomiting.  No earache.  No headache.  Patient reports he is homeless and at this time does not have a place to stay.  He denies he has a Education officer, museum or anyone helping him with homeless issues.    Past Medical History:  Diagnosis Date  . BPH (benign prostatic hyperplasia)   . Colon polyp 2010  . Depression   . GERD (gastroesophageal reflux disease)   . Hepatitis C    "caught it when I had a blood transfusion"  . High cholesterol   . History of blood transfusion    "when I was young"  . Hypertension   . Paranoid schizophrenia ([place])   . Prostate atrophy   . Retinal vein occlusion     Patient Active Problem List   Diagnosis Date Noted  . GERD (gastroesophageal reflux disease) 05/03/2019  . Hypotension 08/02/2018  . Low back sprain, initial encounter 10/21/2017  . History of drug abuse ([place]) 10/18/2017  . Gingival erythema 09/23/2017  . Chronic pain of left knee 11/24/2016  . Tobacco abuse   . Diastolic heart failure ([place]) 02/05/2015  . Healthcare maintenance 05/28/2014  . Hyperlipidemia 03/10/2014  . Patellar tendonitis 11/16/2012  . [place] OCCLUSION 06/03/2010  . PARANOID SCHIZOPHRENIA, CHRONIC 01/17/2009  . HYPERTENSION, BENIGN ESSENTIAL 01/17/2009  . BPH (benign prostatic hyperplasia) 01/17/2009  . HEPATITIS C 12/14/2008  . DEPRESSION 12/14/2008    Past Surgical History:  Procedure Laterality Date  . CIRCUMCISION  1959  . TONSILLECTOMY         Family History  Problem Relation Age of Onset  . Hypertension  Mother   . Diabetes Mother   . Stroke Mother     Social History   Tobacco Use  . Smoking status: Current Every Day Smoker    Packs/day: 0.50    Years: 48.00    Pack years: 24.00    Types: Cigarettes  . Smokeless tobacco: Never Used  Substance Use Topics  . Alcohol use: No  . Drug use: No    Home Medications Prior to Admission medications   Medication Sig Start Date End Date Taking? Authorizing Provider  acetaminophen (TYLENOL) 500 MG tablet Take 1 tablet (500 mg total) by mouth every 6 (six) hours as needed. 04/23/18   [name], [name], PA-C  amLODipine (NORVASC) 10 MG tablet Take 1 tablet (10 mg total) by mouth daily. 04/13/18   [name], DO  aspirin 81 MG tablet Take 1 tablet (81 mg total) by mouth daily. 09/24/14   [name], MD  atorvastatin (LIPITOR) 40 MG tablet Take 1 tablet (40 mg total) by mouth daily. 08/15/19   [name], DO  benztropine (COGENTIN) 1 MG tablet Take 1 tablet (1 mg total) by mouth 2 (two) times daily. 11/05/16   [name], MD  cetirizine (ZYRTEC ALLERGY) 10 MG tablet Take 1 tablet (10 mg total) by mouth daily. 11/14/19   Blanchie Dessert, MD  famotidine (PEPCID) 20 MG tablet Take 1 tablet (20 mg total) by mouth  2 (two) times daily. 05/02/19   [name], DO  fluPHENAZine decanoate (PROLIXIN) 25 MG/ML injection Inject 25 mg into the muscle every 14 (fourteen) days. Last dose 09/07/12    [provider]  fluticasone (FLONASE) 50 MCG/ACT nasal spray Place 2 sprays into both nostrils daily. 11/24/19   Muthersbaugh, Jarrett Soho, PA-C  losartan (COZAAR) 100 MG tablet Take 1 tablet (100 mg total) by mouth daily. 11/28/19   [name], DO  Multiple Vitamin (MULTIVITAMIN WITH MINERALS) TABS tablet Take 1 tablet by mouth daily. 06/28/14   [name], MD  sodium chloride (OCEAN) 0.65 % SOLN nasal spray Place 1 spray into both nostrils as needed for congestion. 11/19/19   [name], PA-C  tamsulosin (FLOMAX) 0.4 MG CAPS  capsule Take 2 capsules (0.8 mg total) by mouth daily. 11/27/17   [name], MD  tamsulosin (FLOMAX) 0.4 MG CAPS capsule TAKE 2 CAPSULE BY MOUTH DAILY 04/17/19   [name], DO  traMADol (ULTRAM) 50 MG tablet Take 1 tablet (50 mg total) by mouth every 6 (six) hours as needed. 07/04/18   [name], MD  triamcinolone cream (KENALOG) 0.1 % Apply 1 application topically 2 (two) times daily. For 5 days, then twice daily as needed for rash and itching. 01/03/15   [name], MD  vitamin C (ASCORBIC ACID) 500 MG tablet Take 500 mg by mouth daily.    [provider]  loratadine (CLARITIN) 10 MG tablet Take 1 tablet (10 mg total) by mouth daily. 11/09/17 11/19/19  Couture, Cortni S, PA-C    Allergies    Lisinopril, Penicillins, Amoxicillin, and Ibuprofen  Review of Systems   Review of Systems 10 Systems reviewed and are negative for acute change except as noted in the HPI.  Physical Exam Updated Vital Signs BP (!) 150/61   Pulse 61   Temp 98.6 F (37 C) (Oral)   Resp 14   SpO2 99%   Physical Exam Constitutional:      Comments: Patient alert and nontoxic.  No distress.  Sitting in exam chair sleeping.  Awakens easily.  Oriented.  HENT:     Head: Normocephalic and atraumatic.     Ears:     Comments: Bilateral TMs no erythema or bulging.    Nose:     Comments: Turbinates mildly erythematous.  No blood in the nasal passages.  Bilateral passages clear.  Scant amount of drainage on the nasal hairs.    Mouth/Throat:     Comments: Airway widely patent.  Mucous membranes are pink and moist.  No postnasal drip or drainage from the nasopharynx.  Poor dentition but no areas of facial swelling or beefiness of the gums.   Eyes:     Extraocular Movements: Extraocular movements intact.  Cardiovascular:     Rate and Rhythm: Normal rate and regular rhythm.  Pulmonary:     Effort: Pulmonary effort is normal.     Breath sounds: Normal breath sounds.  Abdominal:      General: There is no distension.     Palpations: Abdomen is soft.     Tenderness: There is no abdominal tenderness.  Musculoskeletal:        General: Normal range of motion.     Cervical back: Neck supple.     Right lower leg: No edema.     Left lower leg: No edema.  Skin:    General: Skin is warm and dry.  Neurological:     General: No focal deficit present.  Mental Status: He is oriented to person, place, and time.     Motor: No weakness.     Coordination: Coordination normal.  Psychiatric:        Mood and Affect: Mood normal.     ED Results / Procedures / Treatments   Labs (all labs ordered are listed, but only abnormal results are displayed) Labs Reviewed - No data to display  EKG None  Radiology No results found.  Procedures Procedures (including critical care time)  Medications Ordered in ED Medications - No data to display  ED Course  I have reviewed the triage vital signs and the nursing notes.  Pertinent labs & imaging results that were available during my care of the patient were reviewed by me and considered in my medical decision making (see chart for details).    MDM Rules/Calculators/A&P                      Patient is well in appearance.  He is alert and nontoxic.  Ental status is clear.  No significant findings on ENT exam to suggest acute infection.  No antibiotics indicated.  Patient reports he is taking Flonase and Allegra.  No copious amounts of drainage or discharge.  At this time, I do not feel that additional medication will be helpful.  Patient is advised that if nasal congestion is still significantly bothersome, ENT follow-up would be indicated. We discussed homeless issues.  Patient reports at this time he does not have anywhere to stay at night.  He denies that he has worked or is working with a Education officer, museum.  Review of EMR shows a social work consult in 2014.  Will place social work consult.  I suspect frequent ED visits may be related  to needs for shelter. Patient otherwise stable for discharge at this time. Final Clinical Impression(s) / ED Diagnoses Final diagnoses:  Nasal congestion  Homeless    Rx / DC Orders ED Discharge Orders    None       Charlesetta Shanks, MD 12/04/19 [phone]

## 2019-12-04 NOTE — ED Notes (Signed)
Pt name called to be roomed, no response

## 2019-12-04 NOTE — Progress Notes (Signed)
CSW met with patient briefly in ED lobby to provide him with homeless resources. CSW encouraged patient to go to the [facility] for assistance with his homelessness. Patient took resources and walked out of the ED.  Madilyn Fireman, MSW, LCSW-A Transitions of Care  Clinical Social Worker  [facility] Emergency Departments  Medical ICU [phone]

## 2019-12-04 NOTE — ED Triage Notes (Signed)
Pt has nasal congestion. No change

## 2019-12-04 NOTE — ED Notes (Signed)
Pt came back into the lobby from getting panera. Pt took back by assigned nurse.

## 2019-12-04 NOTE — Discharge Instructions (Addendum)
1.  It is currently a time for seasonal allergies.  You have been given prescriptions and recommendations to use Flonase and Allegra.  If these are not helping and you are still having significant symptoms, you should be seen by an ear nose throat specialist.  The name and number for Dr. Lucia Gaskins is included in your discharge instructions.  He is the appropriate specialist for a follow-up appointment. 2.  You have also advised that you are homeless and do not have a place to stay at this time.  A guide to financial resources for care in the community have been included in your discharge instructions.  A consultation is also been placed to social worker to try to assist you.

## 2019-12-06 ENCOUNTER — Other Ambulatory Visit: Payer: Self-pay

## 2019-12-06 ENCOUNTER — Emergency Department (HOSPITAL_COMMUNITY)
Admission: EM | Admit: 2019-12-06 | Discharge: 2019-12-06 | Disposition: A | Discharge status: Home / Self Care | Payer: Medicare Other | Attending: Emergency Medicine | Admitting: Emergency Medicine

## 2019-12-06 ENCOUNTER — Telehealth: Payer: Self-pay | Admitting: Family Medicine

## 2019-12-06 DIAGNOSIS — Z5900 Homelessness unspecified: Secondary | ICD-10-CM

## 2019-12-06 DIAGNOSIS — Z79899 Other long term (current) drug therapy: Secondary | ICD-10-CM | POA: Insufficient documentation

## 2019-12-06 DIAGNOSIS — Z7982 Long term (current) use of aspirin: Secondary | ICD-10-CM | POA: Diagnosis not present

## 2019-12-06 DIAGNOSIS — F17210 Nicotine dependence, cigarettes, uncomplicated: Secondary | ICD-10-CM | POA: Insufficient documentation

## 2019-12-06 DIAGNOSIS — I10 Essential (primary) hypertension: Secondary | ICD-10-CM | POA: Insufficient documentation

## 2019-12-06 DIAGNOSIS — M25569 Pain in unspecified knee: Secondary | ICD-10-CM

## 2019-12-06 DIAGNOSIS — M25562 Pain in left knee: Secondary | ICD-10-CM | POA: Diagnosis not present

## 2019-12-06 NOTE — ED Triage Notes (Signed)
Left knee pain out of BP meds

## 2019-12-06 NOTE — Telephone Encounter (Signed)
Per chart review patient has had frequent ED visits, 21 visits in past 6 months.  Patient is also due for medical clearance exam prior to getting a dental procedure.  Has not been seen by [place] since 05/02/2019.  There were some concerns during previous ED visits that he may be homeless as he stated he did not have a place to stay on visit on 12/04/2019.  I attempted to call again today to check on patient and to see if he would like any social work resources or to schedule appointment with me.  Was unable to contact him and there is no voicemail set up to leave any messages.  I have sent letters in the past as well encouraging patient to follow-up.  Given that patient may have concerns of homelessness and I have not been able to reach him we will place CCM referral.  I discussed this with Dr. Owens Shark who agrees with plan as well.  I have been unable to obtain consent from patient given that we are unable to reach him but given frequency of ED visits I do think this will help decrease his ED visit times.  Dalphine Handing, PGY-3 [facility] 12/06/2019 9:53 AM

## 2019-12-06 NOTE — ED Provider Notes (Signed)
[facility] EMERGENCY DEPARTMENT Provider Note   CSN: [id number] Arrival date & time: 12/06/19  0008     History Chief Complaint  Patient presents with  . Knee Pain    [name] is a 69 y.o. male.  HPI    Patient initially arrived to have left knee pain examined.  He also mentioned he was out of his blood pressure medicines.  When I evaluated patient he was sleeping soundly and had no complaints Past Medical History:  Diagnosis Date  . BPH (benign prostatic hyperplasia)   . Colon polyp 2010  . Depression   . GERD (gastroesophageal reflux disease)   . Hepatitis C    "caught it when I had a blood transfusion"  . High cholesterol   . History of blood transfusion    "when I was young"  . Hypertension   . Paranoid schizophrenia ([place])   . Prostate atrophy   . Retinal vein occlusion     Patient Active Problem List   Diagnosis Date Noted  . GERD (gastroesophageal reflux disease) 05/03/2019  . Hypotension 08/02/2018  . Low back sprain, initial encounter 10/21/2017  . History of drug abuse ([place]) 10/18/2017  . Gingival erythema 09/23/2017  . Chronic pain of left knee 11/24/2016  . Tobacco abuse   . Diastolic heart failure ([place]) 02/05/2015  . Healthcare maintenance 05/28/2014  . Hyperlipidemia 03/10/2014  . Patellar tendonitis 11/16/2012  . [place] OCCLUSION 06/03/2010  . PARANOID SCHIZOPHRENIA, CHRONIC 01/17/2009  . HYPERTENSION, BENIGN ESSENTIAL 01/17/2009  . BPH (benign prostatic hyperplasia) 01/17/2009  . HEPATITIS C 12/14/2008  . DEPRESSION 12/14/2008    Past Surgical History:  Procedure Laterality Date  . CIRCUMCISION  1959  . TONSILLECTOMY         Family History  Problem Relation Age of Onset  . Hypertension Mother   . Diabetes Mother   . Stroke Mother     Social History   Tobacco Use  . Smoking status: Current Every Day Smoker    Packs/day: 0.50    Years: 48.00    Pack years: 24.00    Types: Cigarettes  .  Smokeless tobacco: Never Used  Substance Use Topics  . Alcohol use: No  . Drug use: No    Home Medications Prior to Admission medications   Medication Sig Start Date End Date Taking? Authorizing Provider  acetaminophen (TYLENOL) 500 MG tablet Take 1 tablet (500 mg total) by mouth every 6 (six) hours as needed. 04/23/18   [name], [name], PA-C  amLODipine (NORVASC) 10 MG tablet Take 1 tablet (10 mg total) by mouth daily. 04/13/18   [name], DO  aspirin 81 MG tablet Take 1 tablet (81 mg total) by mouth daily. 09/24/14   [name], MD  atorvastatin (LIPITOR) 40 MG tablet Take 1 tablet (40 mg total) by mouth daily. 08/15/19   [name], DO  benztropine (COGENTIN) 1 MG tablet Take 1 tablet (1 mg total) by mouth 2 (two) times daily. 11/05/16   [name], MD  cetirizine (ZYRTEC ALLERGY) 10 MG tablet Take 1 tablet (10 mg total) by mouth daily. 11/14/19   Blanchie Dessert, MD  famotidine (PEPCID) 20 MG tablet Take 1 tablet (20 mg total) by mouth 2 (two) times daily. 05/02/19   [name], DO  fluPHENAZine decanoate (PROLIXIN) 25 MG/ML injection Inject 25 mg into the muscle every 14 (fourteen) days. Last dose 09/07/12    [provider]  fluticasone (FLONASE) 50 MCG/ACT nasal  spray Place 2 sprays into both nostrils daily. 11/24/19   Muthersbaugh, Jarrett Soho, PA-C  losartan (COZAAR) 100 MG tablet Take 1 tablet (100 mg total) by mouth daily. 11/28/19   [name], DO  Multiple Vitamin (MULTIVITAMIN WITH MINERALS) TABS tablet Take 1 tablet by mouth daily. 06/28/14   [name], MD  sodium chloride (OCEAN) 0.65 % SOLN nasal spray Place 1 spray into both nostrils as needed for congestion. 11/19/19   [name], PA-C  tamsulosin (FLOMAX) 0.4 MG CAPS capsule Take 2 capsules (0.8 mg total) by mouth daily. 11/27/17   [name], MD  tamsulosin (FLOMAX) 0.4 MG CAPS capsule TAKE 2 CAPSULE BY MOUTH DAILY 04/17/19   [name], DO  traMADol (ULTRAM) 50 MG  tablet Take 1 tablet (50 mg total) by mouth every 6 (six) hours as needed. 07/04/18   [name], MD  triamcinolone cream (KENALOG) 0.1 % Apply 1 application topically 2 (two) times daily. For 5 days, then twice daily as needed for rash and itching. 01/03/15   [name], MD  vitamin C (ASCORBIC ACID) 500 MG tablet Take 500 mg by mouth daily.    [provider]  loratadine (CLARITIN) 10 MG tablet Take 1 tablet (10 mg total) by mouth daily. 11/09/17 11/19/19  Couture, Cortni S, PA-C    Allergies    Lisinopril, Penicillins, Amoxicillin, and Ibuprofen  Review of Systems   Review of Systems  Constitutional: Negative for fever.  Musculoskeletal: Positive for arthralgias.    Physical Exam Updated Vital Signs BP (!) 168/107   Pulse 64   Temp 97.6 F (36.4 C) (Oral)   Resp 17   SpO2 99%   Physical Exam CONSTITUTIONAL: Disheveled, resting comfortably, no acute distress HEAD: Normocephalic/atraumatic ENMT: Mucous membranes moist NECK: supple no meningeal signs LUNGS:  no apparent distress ABDOMEN: soft NEURO: Pt is awake/alert/appropriate, moves all extremitiesx4.  No facial droop.  Able to ambulate independently EXTREMITIES: pulses normal/equal, full ROM, no knee tenderness noted SKIN: warm, color normal  ED Results / Procedures / Treatments   Labs (all labs ordered are listed, but only abnormal results are displayed) Labs Reviewed - No data to display  EKG None  Radiology No results found.  Procedures Procedures (including critical care time)  Medications Ordered in ED Medications - No data to display  ED Course  I have reviewed the triage vital signs and the nursing notes.  MDM Rules/Calculators/A&P                      Patient presents for 21st ER visit in 6 months. He did not mention the pain on my exam.  Patient appropriate for discharge Final Clinical Impression(s) / ED Diagnoses Final diagnoses:  Knee pain, unspecified chronicity, unspecified  laterality    Rx / DC Orders ED Discharge Orders    None       Ripley Fraise, MD 12/06/19 [phone]

## 2019-12-06 NOTE — ED Notes (Signed)
Discharge instructions discussed with pt. Pt was able to verbalized understanding with no questions at this time.   Pt ambulatory at discharge

## 2019-12-07 ENCOUNTER — Encounter (HOSPITAL_COMMUNITY): Payer: Self-pay | Admitting: Emergency Medicine

## 2019-12-07 ENCOUNTER — Emergency Department (HOSPITAL_COMMUNITY)
Admission: EM | Admit: 2019-12-07 | Discharge: 2019-12-07 | Disposition: A | Discharge status: Home / Self Care | Payer: Medicare Other | Attending: Emergency Medicine | Admitting: Emergency Medicine

## 2019-12-07 ENCOUNTER — Other Ambulatory Visit: Payer: Self-pay

## 2019-12-07 DIAGNOSIS — Z5321 Procedure and treatment not carried out due to patient leaving prior to being seen by health care provider: Secondary | ICD-10-CM | POA: Insufficient documentation

## 2019-12-07 DIAGNOSIS — R0981 Nasal congestion: Secondary | ICD-10-CM | POA: Insufficient documentation

## 2019-12-07 NOTE — ED Notes (Signed)
Pt left and said he would be back tomorrow

## 2019-12-07 NOTE — ED Triage Notes (Signed)
Patient reports nasal congestion/runny nose and itchy eyes onset this week , respirations unlabored , denies fever or chills .

## 2019-12-09 ENCOUNTER — Emergency Department (HOSPITAL_COMMUNITY)
Admission: EM | Admit: 2019-12-09 | Discharge: 2019-12-09 | Disposition: A | Discharge status: Home / Self Care | Payer: Medicare Other | Attending: Emergency Medicine | Admitting: Emergency Medicine

## 2019-12-09 ENCOUNTER — Other Ambulatory Visit: Payer: Self-pay

## 2019-12-09 DIAGNOSIS — Z7982 Long term (current) use of aspirin: Secondary | ICD-10-CM | POA: Insufficient documentation

## 2019-12-09 DIAGNOSIS — Z79899 Other long term (current) drug therapy: Secondary | ICD-10-CM | POA: Diagnosis not present

## 2019-12-09 DIAGNOSIS — B182 Chronic viral hepatitis C: Secondary | ICD-10-CM | POA: Insufficient documentation

## 2019-12-09 DIAGNOSIS — I10 Essential (primary) hypertension: Secondary | ICD-10-CM | POA: Insufficient documentation

## 2019-12-09 DIAGNOSIS — F200 Paranoid schizophrenia: Secondary | ICD-10-CM | POA: Diagnosis not present

## 2019-12-09 DIAGNOSIS — R0981 Nasal congestion: Secondary | ICD-10-CM

## 2019-12-09 DIAGNOSIS — F17210 Nicotine dependence, cigarettes, uncomplicated: Secondary | ICD-10-CM | POA: Insufficient documentation

## 2019-12-09 MED ORDER — CETIRIZINE HCL 10 MG PO TABS: 10.0000 mg | ORAL_TABLET | Freq: Every day | ORAL | 1 refills | Status: DC

## 2019-12-09 NOTE — ED Provider Notes (Signed)
[facility] EMERGENCY DEPARTMENT Provider Note   CSN: [id number] Arrival date & time: 12/09/19  0006     History Chief Complaint  Patient presents with  . Nasal Congestion    [name] is a 69 y.o. male.  The history is provided by the patient and medical records.    69 y.o. M with hx of BPH, depression, GERD, hep C, HLP, paranoid schizophrenia, HTN, presenting to the ED for nasal congestion.  Patient has been seen here 23 times in the past 6 months for similar related complaints.  He reports he was told by a friend he has allergies.  He wants medications for this.   Past Medical History:  Diagnosis Date  . BPH (benign prostatic hyperplasia)   . Colon polyp 2010  . Depression   . GERD (gastroesophageal reflux disease)   . Hepatitis C    "caught it when I had a blood transfusion"  . High cholesterol   . History of blood transfusion    "when I was young"  . Hypertension   . Paranoid schizophrenia ([place])   . Prostate atrophy   . Retinal vein occlusion     Patient Active Problem List   Diagnosis Date Noted  . GERD (gastroesophageal reflux disease) 05/03/2019  . Hypotension 08/02/2018  . Low back sprain, initial encounter 10/21/2017  . History of drug abuse ([place]) 10/18/2017  . Gingival erythema 09/23/2017  . Chronic pain of left knee 11/24/2016  . Tobacco abuse   . Diastolic heart failure ([place]) 02/05/2015  . Healthcare maintenance 05/28/2014  . Hyperlipidemia 03/10/2014  . Patellar tendonitis 11/16/2012  . [place] OCCLUSION 06/03/2010  . PARANOID SCHIZOPHRENIA, CHRONIC 01/17/2009  . HYPERTENSION, BENIGN ESSENTIAL 01/17/2009  . BPH (benign prostatic hyperplasia) 01/17/2009  . HEPATITIS C 12/14/2008  . DEPRESSION 12/14/2008    Past Surgical History:  Procedure Laterality Date  . CIRCUMCISION  1959  . TONSILLECTOMY         Family History  Problem Relation Age of Onset  . Hypertension Mother   . Diabetes Mother   . Stroke  Mother     Social History   Tobacco Use  . Smoking status: Current Every Day Smoker    Packs/day: 0.50    Years: 48.00    Pack years: 24.00    Types: Cigarettes  . Smokeless tobacco: Never Used  Substance Use Topics  . Alcohol use: No  . Drug use: No    Home Medications Prior to Admission medications   Medication Sig Start Date End Date Taking? Authorizing Provider  acetaminophen (TYLENOL) 500 MG tablet Take 1 tablet (500 mg total) by mouth every 6 (six) hours as needed. 04/23/18   [name], [name], PA-C  amLODipine (NORVASC) 10 MG tablet Take 1 tablet (10 mg total) by mouth daily. 04/13/18   [name], DO  aspirin 81 MG tablet Take 1 tablet (81 mg total) by mouth daily. 09/24/14   [name], MD  atorvastatin (LIPITOR) 40 MG tablet Take 1 tablet (40 mg total) by mouth daily. 08/15/19   [name], DO  benztropine (COGENTIN) 1 MG tablet Take 1 tablet (1 mg total) by mouth 2 (two) times daily. 11/05/16   [name], MD  cetirizine (ZYRTEC ALLERGY) 10 MG tablet Take 1 tablet (10 mg total) by mouth daily. 11/14/19   Blanchie Dessert, MD  famotidine (PEPCID) 20 MG tablet Take 1 tablet (20 mg total) by mouth 2 (two) times daily. 05/02/19   Tammi Klippel,  Sherin, DO  fluPHENAZine decanoate (PROLIXIN) 25 MG/ML injection Inject 25 mg into the muscle every 14 (fourteen) days. Last dose 09/07/12    [provider]  fluticasone (FLONASE) 50 MCG/ACT nasal spray Place 2 sprays into both nostrils daily. 11/24/19   Muthersbaugh, Jarrett Soho, PA-C  losartan (COZAAR) 100 MG tablet Take 1 tablet (100 mg total) by mouth daily. 11/28/19   [name], DO  Multiple Vitamin (MULTIVITAMIN WITH MINERALS) TABS tablet Take 1 tablet by mouth daily. 06/28/14   [name], MD  sodium chloride (OCEAN) 0.65 % SOLN nasal spray Place 1 spray into both nostrils as needed for congestion. 11/19/19   [name], PA-C  tamsulosin (FLOMAX) 0.4 MG CAPS capsule Take 2 capsules (0.8 mg total) by  mouth daily. 11/27/17   [name], MD  tamsulosin (FLOMAX) 0.4 MG CAPS capsule TAKE 2 CAPSULE BY MOUTH DAILY 04/17/19   [name], DO  traMADol (ULTRAM) 50 MG tablet Take 1 tablet (50 mg total) by mouth every 6 (six) hours as needed. 07/04/18   [name], MD  triamcinolone cream (KENALOG) 0.1 % Apply 1 application topically 2 (two) times daily. For 5 days, then twice daily as needed for rash and itching. 01/03/15   [name], MD  vitamin C (ASCORBIC ACID) 500 MG tablet Take 500 mg by mouth daily.    [provider]  loratadine (CLARITIN) 10 MG tablet Take 1 tablet (10 mg total) by mouth daily. 11/09/17 11/19/19  Couture, Cortni S, PA-C    Allergies    Lisinopril, Penicillins, Amoxicillin, and Ibuprofen  Review of Systems   Review of Systems  HENT: Positive for congestion.   All other systems reviewed and are negative.   Physical Exam Updated Vital Signs BP (!) 174/94 (BP Location: Left Arm)   Pulse 73   Temp 98.6 F (37 C) (Oral)   Resp 16   SpO2 99%   Physical Exam Vitals and nursing note reviewed.  Constitutional:      Appearance: He is well-developed.     Comments: Sleeping, had to be awoken for exam  HENT:     Head: Normocephalic and atraumatic.     Right Ear: Tympanic membrane and ear canal normal.     Left Ear: Tympanic membrane and ear canal normal.     Nose: Nose normal.  Eyes:     Conjunctiva/sclera: Conjunctivae normal.     Pupils: Pupils are equal, round, and reactive to light.  Cardiovascular:     Rate and Rhythm: Normal rate and regular rhythm.     Heart sounds: Normal heart sounds.  Pulmonary:     Effort: Pulmonary effort is normal.     Breath sounds: Normal breath sounds.  Abdominal:     General: Bowel sounds are normal.     Palpations: Abdomen is soft.  Musculoskeletal:        General: Normal range of motion.     Cervical back: Normal range of motion.  Skin:    General: Skin is warm and dry.  Neurological:      Mental Status: He is alert and oriented to person, place, and time.     ED Results / Procedures / Treatments   Labs (all labs ordered are listed, but only abnormal results are displayed) Labs Reviewed - No data to display  EKG None  Radiology No results found.  Procedures Procedures (including critical care time)  Medications Ordered in ED Medications - No data to display  ED Course  I have reviewed the triage vital signs and the nursing notes.  Pertinent labs & imaging results that were available during my care of the patient were reviewed by me and considered in my medical decision making (see chart for details).    MDM Rules/Calculators/A&P  69 year old male here with nasal congestion.  23 visits in the past 6 months, most of which for nasal congestion.  Reports he has allergies and is requesting medication.  He is sleeping on initial assessment, had to be awoken for exam.  He has no acute findings on exam today.  He has been given prescriptions for Claritin and Zyrtec on multiple occasions and has yet to fill them.  I have once again written him for Zyrtec.  Advised he can follow-up with the wellness clinic.  He may return here for any new or acute changes.  Final Clinical Impression(s) / ED Diagnoses Final diagnoses:  Nasal congestion    Rx / DC Orders ED Discharge Orders    None       [name], PA-C 12/09/19 0434    Orpah Greek, MD 12/09/19 [phone]

## 2019-12-09 NOTE — ED Notes (Signed)
Pt refused vitals 

## 2019-12-09 NOTE — ED Triage Notes (Signed)
Pt c/o nasal congestion and an "infection" in his nose. Was seen here on 04/22 for same.

## 2019-12-09 NOTE — Discharge Instructions (Addendum)
Can start taking zyrtec for allergies. Follow-up with the wellness clinic. Return here for new concerns.

## 2019-12-11 ENCOUNTER — Emergency Department (HOSPITAL_COMMUNITY)
Admission: EM | Admit: 2019-12-11 | Discharge: 2019-12-11 | Disposition: A | Discharge status: Home / Self Care | Payer: Medicare Other | Attending: Emergency Medicine | Admitting: Emergency Medicine

## 2019-12-11 DIAGNOSIS — I5030 Unspecified diastolic (congestive) heart failure: Secondary | ICD-10-CM | POA: Insufficient documentation

## 2019-12-11 DIAGNOSIS — Z7982 Long term (current) use of aspirin: Secondary | ICD-10-CM | POA: Insufficient documentation

## 2019-12-11 DIAGNOSIS — Z79899 Other long term (current) drug therapy: Secondary | ICD-10-CM | POA: Diagnosis not present

## 2019-12-11 DIAGNOSIS — I110 Hypertensive heart disease with heart failure: Secondary | ICD-10-CM | POA: Diagnosis not present

## 2019-12-11 DIAGNOSIS — R0981 Nasal congestion: Secondary | ICD-10-CM

## 2019-12-11 DIAGNOSIS — F17210 Nicotine dependence, cigarettes, uncomplicated: Secondary | ICD-10-CM | POA: Diagnosis not present

## 2019-12-11 MED ORDER — FLUTICASONE PROPIONATE 50 MCG/ACT NA SUSP: 2.0000 | Freq: Every day | NASAL | 0 refills | Status: DC

## 2019-12-11 NOTE — ED Provider Notes (Signed)
[facility] EMERGENCY DEPARTMENT Provider Note  CSN: [id number] Arrival date & time: 12/11/19 0005  Chief Complaint(s) Nasal Congestion  HPI [name] is a 69 y.o. male here for chronic nasal congestion. No fevers or chills. No coughing. No bleeding. Denies cocaine use. States that he has tried the allergy meds w/o relief.  HPI  Past Medical History Past Medical History:  Diagnosis Date  . BPH (benign prostatic hyperplasia)   . Colon polyp 2010  . Depression   . GERD (gastroesophageal reflux disease)   . Hepatitis C    "caught it when I had a blood transfusion"  . High cholesterol   . History of blood transfusion    "when I was young"  . Hypertension   . Paranoid schizophrenia ([place])   . Prostate atrophy   . Retinal vein occlusion    Patient Active Problem List   Diagnosis Date Noted  . GERD (gastroesophageal reflux disease) 05/03/2019  . Hypotension 08/02/2018  . Low back sprain, initial encounter 10/21/2017  . History of drug abuse ([place]) 10/18/2017  . Gingival erythema 09/23/2017  . Chronic pain of left knee 11/24/2016  . Tobacco abuse   . Diastolic heart failure ([place]) 02/05/2015  . Healthcare maintenance 05/28/2014  . Hyperlipidemia 03/10/2014  . Patellar tendonitis 11/16/2012  . [place] OCCLUSION 06/03/2010  . PARANOID SCHIZOPHRENIA, CHRONIC 01/17/2009  . HYPERTENSION, BENIGN ESSENTIAL 01/17/2009  . BPH (benign prostatic hyperplasia) 01/17/2009  . HEPATITIS C 12/14/2008  . DEPRESSION 12/14/2008   Home Medication(s) Prior to Admission medications   Medication Sig Start Date End Date Taking? Authorizing Provider  acetaminophen (TYLENOL) 500 MG tablet Take 1 tablet (500 mg total) by mouth every 6 (six) hours as needed. 04/23/18   [name], [name], PA-C  amLODipine (NORVASC) 10 MG tablet Take 1 tablet (10 mg total) by mouth daily. 04/13/18   [name], DO  aspirin 81 MG tablet Take 1 tablet (81 mg total) by mouth daily. 09/24/14    [name], MD  atorvastatin (LIPITOR) 40 MG tablet Take 1 tablet (40 mg total) by mouth daily. 08/15/19   [name], DO  benztropine (COGENTIN) 1 MG tablet Take 1 tablet (1 mg total) by mouth 2 (two) times daily. 11/05/16   [name], MD  cetirizine (ZYRTEC ALLERGY) 10 MG tablet Take 1 tablet (10 mg total) by mouth daily. 12/09/19   [name], PA-C  famotidine (PEPCID) 20 MG tablet Take 1 tablet (20 mg total) by mouth 2 (two) times daily. 05/02/19   [name], DO  fluPHENAZine decanoate (PROLIXIN) 25 MG/ML injection Inject 25 mg into the muscle every 14 (fourteen) days. Last dose 09/07/12    [provider]  fluticasone (FLONASE) 50 MCG/ACT nasal spray Place 2 sprays into both nostrils daily. 12/11/19   Fatima Blank, MD  losartan (COZAAR) 100 MG tablet Take 1 tablet (100 mg total) by mouth daily. 11/28/19   [name], DO  Multiple Vitamin (MULTIVITAMIN WITH MINERALS) TABS tablet Take 1 tablet by mouth daily. 06/28/14   [name], MD  sodium chloride (OCEAN) 0.65 % SOLN nasal spray Place 1 spray into both nostrils as needed for congestion. 11/19/19   [name], PA-C  tamsulosin (FLOMAX) 0.4 MG CAPS capsule Take 2 capsules (0.8 mg total) by mouth daily. 11/27/17   [name], MD  tamsulosin (FLOMAX) 0.4 MG CAPS capsule TAKE 2 CAPSULE BY MOUTH DAILY 04/17/19   [name], DO  traMADol Veatrice Bourbon) 50  MG tablet Take 1 tablet (50 mg total) by mouth every 6 (six) hours as needed. 07/04/18   [name], MD  triamcinolone cream (KENALOG) 0.1 % Apply 1 application topically 2 (two) times daily. For 5 days, then twice daily as needed for rash and itching. 01/03/15   [name], MD  vitamin C (ASCORBIC ACID) 500 MG tablet Take 500 mg by mouth daily.    [provider]  loratadine (CLARITIN) 10 MG tablet Take 1 tablet (10 mg total) by mouth daily. 11/09/17 11/19/19  Couture, Willia Craze, PA-C                                                                                                                                     Past Surgical History Past Surgical History:  Procedure Laterality Date  . CIRCUMCISION  1959  . TONSILLECTOMY     Family History Family History  Problem Relation Age of Onset  . Hypertension Mother   . Diabetes Mother   . Stroke Mother     Social History Social History   Tobacco Use  . Smoking status: Current Every Day Smoker    Packs/day: 0.50    Years: 48.00    Pack years: 24.00    Types: Cigarettes  . Smokeless tobacco: Never Used  Substance Use Topics  . Alcohol use: No  . Drug use: No   Allergies Lisinopril, Penicillins, Amoxicillin, and Ibuprofen  Review of Systems Review of Systems All other systems are reviewed and are negative for acute change except as noted in the HPI  Physical Exam Vital Signs  I have reviewed the triage vital signs BP (!) 141/99 (BP Location: Left Arm)   Pulse 73   Temp 98.8 F (37.1 C) (Oral)   Resp 18   SpO2 97%   Physical Exam Vitals reviewed.  Constitutional:      General: He is not in acute distress.    Appearance: He is well-developed. He is not diaphoretic.  HENT:     Head: Normocephalic and atraumatic.     Nose: Rhinorrhea present.     Right Nostril: No epistaxis.     Left Nostril: No epistaxis.  Eyes:     General: No scleral icterus.       Right eye: No discharge.        Left eye: No discharge.     Conjunctiva/sclera: Conjunctivae normal.     Pupils: Pupils are equal, round, and reactive to light.  Cardiovascular:     Rate and Rhythm: Normal rate and regular rhythm.     Heart sounds: No murmur. No friction rub. No gallop.   Pulmonary:     Effort: Pulmonary effort is normal. No respiratory distress.     Breath sounds: Normal breath sounds. No stridor. No rales.  Abdominal:     General: There is no distension.     Palpations: Abdomen is soft.  Tenderness: There is no abdominal tenderness.    Musculoskeletal:        General: No tenderness.     Cervical back: Normal range of motion and neck supple.  Skin:    General: Skin is warm and dry.     Findings: No erythema or rash.  Neurological:     Mental Status: He is alert and oriented to person, place, and time.     ED Results and Treatments Labs (all labs ordered are listed, but only abnormal results are displayed) Labs Reviewed - No data to display                                                                                                                       EKG  EKG Interpretation  Date/Time:    Ventricular Rate:    PR Interval:    QRS Duration:   QT Interval:    QTC Calculation:   R Axis:     Text Interpretation:        Radiology No results found.  Pertinent labs & imaging results that were available during my care of the patient were reviewed by me and considered in my medical decision making (see chart for details).  Medications Ordered in ED Medications - No data to display                                                                                                                                  Procedures Procedures  (including critical care time)  Medical Decision Making / ED Course I have reviewed the nursing notes for this encounter and the patient's prior records (if available in EHR or on provided paperwork).   [name] was evaluated in Emergency Department on 12/11/2019 for the symptoms described in the history of present illness. He was evaluated in the context of the global COVID-19 pandemic, which necessitated consideration that the patient might be at risk for infection with the SARS-CoV-2 virus that causes COVID-19. Institutional protocols and algorithms that pertain to the evaluation of patients at risk for COVID-19 are in a state of rapid change based on information released by regulatory bodies including the CDC and federal and state organizations. These policies and  algorithms were followed during the patient's care in the ED.  Chronic nasal congestion. Recommend nasal spray.      Final Clinical Impression(s) / ED Diagnoses Final diagnoses:  Nasal congestion  The patient appears reasonably screened and/or stabilized for discharge and I doubt any other medical condition or other [facility] requiring further screening, evaluation, or treatment in the ED at this time prior to discharge. Safe for discharge with strict return precautions.  Disposition: Discharge  Condition: Good  I have discussed the results, Dx and Tx plan with the patient/family who expressed understanding and agree(s) with the plan. Discharge instructions discussed at length. The patient/family was given strict return precautions who verbalized understanding of the instructions. No further questions at time of discharge.    ED Discharge Orders         Ordered    fluticasone (FLONASE) 50 MCG/ACT nasal spray  Daily     12/11/19 0121            Follow Up: Primary care provider  Schedule an appointment as soon as possible for a visit        This chart was dictated using voice recognition software.  Despite best efforts to proofread,  errors can occur which can change the documentation meaning.   Fatima Blank, MD 12/11/19 [phone]

## 2019-12-11 NOTE — ED Triage Notes (Signed)
Pt continues to c/o nasal congestion

## 2019-12-11 NOTE — Discharge Instructions (Signed)
Please use nasal allergy spray.

## 2019-12-12 ENCOUNTER — Emergency Department (HOSPITAL_COMMUNITY)
Admission: EM | Admit: 2019-12-12 | Discharge: 2019-12-13 | Disposition: A | Discharge status: Home / Self Care | Payer: Medicare Other | Attending: Emergency Medicine | Admitting: Emergency Medicine

## 2019-12-12 ENCOUNTER — Encounter (HOSPITAL_COMMUNITY): Payer: Self-pay | Admitting: *Deleted

## 2019-12-12 ENCOUNTER — Emergency Department (HOSPITAL_COMMUNITY)
Admission: EM | Admit: 2019-12-12 | Discharge: 2019-12-12 | Disposition: A | Discharge status: Home / Self Care | Payer: Medicare Other | Source: Home / Self Care | Attending: Emergency Medicine | Admitting: Emergency Medicine

## 2019-12-12 ENCOUNTER — Other Ambulatory Visit: Payer: Self-pay

## 2019-12-12 ENCOUNTER — Encounter (HOSPITAL_COMMUNITY): Payer: Self-pay | Admitting: Emergency Medicine

## 2019-12-12 DIAGNOSIS — M25562 Pain in left knee: Secondary | ICD-10-CM | POA: Insufficient documentation

## 2019-12-12 DIAGNOSIS — G8929 Other chronic pain: Secondary | ICD-10-CM | POA: Diagnosis not present

## 2019-12-12 DIAGNOSIS — Z79899 Other long term (current) drug therapy: Secondary | ICD-10-CM | POA: Diagnosis not present

## 2019-12-12 DIAGNOSIS — F200 Paranoid schizophrenia: Secondary | ICD-10-CM | POA: Diagnosis not present

## 2019-12-12 DIAGNOSIS — Z7982 Long term (current) use of aspirin: Secondary | ICD-10-CM | POA: Diagnosis not present

## 2019-12-12 DIAGNOSIS — I10 Essential (primary) hypertension: Secondary | ICD-10-CM | POA: Diagnosis not present

## 2019-12-12 DIAGNOSIS — B182 Chronic viral hepatitis C: Secondary | ICD-10-CM | POA: Insufficient documentation

## 2019-12-12 DIAGNOSIS — Z5321 Procedure and treatment not carried out due to patient leaving prior to being seen by health care provider: Secondary | ICD-10-CM | POA: Insufficient documentation

## 2019-12-12 DIAGNOSIS — F17210 Nicotine dependence, cigarettes, uncomplicated: Secondary | ICD-10-CM | POA: Insufficient documentation

## 2019-12-12 DIAGNOSIS — M25569 Pain in unspecified knee: Secondary | ICD-10-CM | POA: Insufficient documentation

## 2019-12-12 NOTE — ED Triage Notes (Signed)
Pt c/o left knee pain.  Pt has been seen multiple times for same.  Pt ambulatory to triage

## 2019-12-12 NOTE — ED Triage Notes (Signed)
Reports left knee pain and swelling. Ambulatory at triage.

## 2019-12-13 ENCOUNTER — Ambulatory Visit: Payer: Self-pay | Admitting: Licensed Clinical Social Worker

## 2019-12-13 DIAGNOSIS — Z5900 Homelessness unspecified: Secondary | ICD-10-CM

## 2019-12-13 MED ORDER — ACETAMINOPHEN 325 MG PO TABS
325.0000 mg | ORAL_TABLET | Freq: Once | ORAL | Status: AC
  Administered 2019-12-13: 325 mg via ORAL
  Filled 2019-12-13: qty 1

## 2019-12-13 NOTE — Chronic Care Management (AMB) (Signed)
Social Work Care Management  Referral Note  12/13/2019 Name: [name] MRN: [id number] DOB: July 16, 1951  [name] is a 69 y.o. year old male who sees [name], DO for primary care.  LCSW was consulted by PCP to assistance patient with [employer] for housing due to being homeless. PCP also indicated difficulty with reaching patient for ongoing care. Patient seen by inpatient social worker on December 04, 2019 and was provided with housing resources.   Review of patient status, including review of consultants reports, relevant laboratory and other test results, as well as collaboration with appropriate care team members,  and the patient's provider was performed as part of comprehensive patient evaluation and provision of chronic care management services.    Recommendation: LCSW reviewed referral and based on the chart review determined that housing seeming to be the top priority at this time for patient.  Last visit with PCP was Sep. 2020.  Patient may have unmet clinical needs but main focus is housing.  Patient's needs are best met by CCM care guides.  Plan:  1. Patient is being referred to the Care Guide for assistance with community resources for housing resources.  2.   The Care Guide will contact the Care Management team if clinical needs are identified during their encounter.   Casimer Lanius, [place] / [place]   [phone] 2:21 PM

## 2019-12-13 NOTE — ED Provider Notes (Signed)
[facility] EMERGENCY DEPARTMENT Provider Note   CSN: [id number] Arrival date & time: 12/12/19  2329     History Chief Complaint  Patient presents with  . Knee Pain    [name] is a 69 y.o. male.  HPI     This is a 69 year old male with a history of hepatitis, hyperlipidemia, paranoid schizophrenia who presents with left knee pain.  Patient has had 26 visits in the last 6 months.  He was last seen 2 days ago for nasal congestion.  He has been seen twice earlier this month for knee pain.  Patient reports he has pain in his left knee.  It is worse with ambulation.  He has not taken anything for the pain.  He rates his pain at 10 out of 10.  Denies fevers or overlying skin changes.  Patient denies injury.  Past Medical History:  Diagnosis Date  . BPH (benign prostatic hyperplasia)   . Colon polyp 2010  . Depression   . GERD (gastroesophageal reflux disease)   . Hepatitis C    "caught it when I had a blood transfusion"  . High cholesterol   . History of blood transfusion    "when I was young"  . Hypertension   . Paranoid schizophrenia ([place])   . Prostate atrophy   . Retinal vein occlusion     Patient Active Problem List   Diagnosis Date Noted  . GERD (gastroesophageal reflux disease) 05/03/2019  . Hypotension 08/02/2018  . Low back sprain, initial encounter 10/21/2017  . History of drug abuse ([place]) 10/18/2017  . Gingival erythema 09/23/2017  . Chronic pain of left knee 11/24/2016  . Tobacco abuse   . Diastolic heart failure ([place]) 02/05/2015  . Healthcare maintenance 05/28/2014  . Hyperlipidemia 03/10/2014  . Patellar tendonitis 11/16/2012  . [place] OCCLUSION 06/03/2010  . PARANOID SCHIZOPHRENIA, CHRONIC 01/17/2009  . HYPERTENSION, BENIGN ESSENTIAL 01/17/2009  . BPH (benign prostatic hyperplasia) 01/17/2009  . HEPATITIS C 12/14/2008  . DEPRESSION 12/14/2008    Past Surgical History:  Procedure Laterality Date  . CIRCUMCISION   1959  . TONSILLECTOMY         Family History  Problem Relation Age of Onset  . Hypertension Mother   . Diabetes Mother   . Stroke Mother     Social History   Tobacco Use  . Smoking status: Current Every Day Smoker    Packs/day: 0.50    Years: 48.00    Pack years: 24.00    Types: Cigarettes  . Smokeless tobacco: Never Used  Substance Use Topics  . Alcohol use: No  . Drug use: No    Home Medications Prior to Admission medications   Medication Sig Start Date End Date Taking? Authorizing Provider  acetaminophen (TYLENOL) 500 MG tablet Take 1 tablet (500 mg total) by mouth every 6 (six) hours as needed. 04/23/18   [name], [name], PA-C  amLODipine (NORVASC) 10 MG tablet Take 1 tablet (10 mg total) by mouth daily. 04/13/18   [name], DO  aspirin 81 MG tablet Take 1 tablet (81 mg total) by mouth daily. 09/24/14   [name], MD  atorvastatin (LIPITOR) 40 MG tablet Take 1 tablet (40 mg total) by mouth daily. 08/15/19   [name], DO  benztropine (COGENTIN) 1 MG tablet Take 1 tablet (1 mg total) by mouth 2 (two) times daily. 11/05/16   [name], MD  cetirizine (ZYRTEC ALLERGY) 10 MG tablet Take 1 tablet (10  mg total) by mouth daily. 12/09/19   [name], PA-C  famotidine (PEPCID) 20 MG tablet Take 1 tablet (20 mg total) by mouth 2 (two) times daily. 05/02/19   [name], DO  fluPHENAZine decanoate (PROLIXIN) 25 MG/ML injection Inject 25 mg into the muscle every 14 (fourteen) days. Last dose 09/07/12    [provider]  fluticasone (FLONASE) 50 MCG/ACT nasal spray Place 2 sprays into both nostrils daily. 12/11/19   Fatima Blank, MD  losartan (COZAAR) 100 MG tablet Take 1 tablet (100 mg total) by mouth daily. 11/28/19   [name], DO  Multiple Vitamin (MULTIVITAMIN WITH MINERALS) TABS tablet Take 1 tablet by mouth daily. 06/28/14   [name], MD  sodium chloride (OCEAN) 0.65 % SOLN nasal spray Place 1 spray into both  nostrils as needed for congestion. 11/19/19   [name], PA-C  tamsulosin (FLOMAX) 0.4 MG CAPS capsule Take 2 capsules (0.8 mg total) by mouth daily. 11/27/17   [name], MD  tamsulosin (FLOMAX) 0.4 MG CAPS capsule TAKE 2 CAPSULE BY MOUTH DAILY 04/17/19   [name], DO  traMADol (ULTRAM) 50 MG tablet Take 1 tablet (50 mg total) by mouth every 6 (six) hours as needed. 07/04/18   [name], MD  triamcinolone cream (KENALOG) 0.1 % Apply 1 application topically 2 (two) times daily. For 5 days, then twice daily as needed for rash and itching. 01/03/15   [name], MD  vitamin C (ASCORBIC ACID) 500 MG tablet Take 500 mg by mouth daily.    [provider]  loratadine (CLARITIN) 10 MG tablet Take 1 tablet (10 mg total) by mouth daily. 11/09/17 11/19/19  Couture, Cortni S, PA-C    Allergies    Lisinopril, Penicillins, Amoxicillin, and Ibuprofen  Review of Systems   Review of Systems  Constitutional: Negative for fever.  Musculoskeletal:       Knee pain  Neurological: Negative for weakness and numbness.  All other systems reviewed and are negative.   Physical Exam Updated Vital Signs BP (!) 159/113 (BP Location: Left Arm)   Pulse 76   Temp 98.6 F (37 C) (Oral)   Resp 18   Ht 1.727 m (5\' 8" )   Wt 78.9 kg   SpO2 100%   BMI 26.46 kg/m   Physical Exam Vitals and nursing note reviewed.  Constitutional:      Appearance: He is well-developed.     Comments: Chronically ill-appearing but nontoxic, thin  HENT:     Head: Normocephalic and atraumatic.     Mouth/Throat:     Mouth: Mucous membranes are moist.  Eyes:     Pupils: Pupils are equal, round, and reactive to light.  Cardiovascular:     Rate and Rhythm: Normal rate and regular rhythm.  Pulmonary:     Effort: Pulmonary effort is normal. No respiratory distress.  Musculoskeletal:     Cervical back: Neck supple.     Comments: Focused examination of the left knee with normal range of motion,  no joint line tenderness, no obvious effusion, no overlying skin changes or warmth, slight crepitus with range of motion  Skin:    General: Skin is warm and dry.  Neurological:     General: No focal deficit present.     Mental Status: He is alert.  Psychiatric:        Mood and Affect: Mood normal.     ED Results / Procedures / Treatments   Labs (all labs ordered  are listed, but only abnormal results are displayed) Labs Reviewed - No data to display  EKG None  Radiology No results found.  Procedures Procedures (including critical care time)  Medications Ordered in ED Medications  acetaminophen (TYLENOL) tablet 325 mg (has no administration in time range)    ED Course  I have reviewed the triage vital signs and the nursing notes.  Pertinent labs & imaging results that were available during my care of the patient were reviewed by me and considered in my medical decision making (see chart for details).    MDM Rules/Calculators/A&P                       Patient presents with left knee pain.  This is a chronic complaint.  His exam is reassuring.  No overlying skin changes or exam findings consistent with septic arthritis.  He does have some crepitus which would suggest some osteoarthritis.  No significant effusion.  Patient was given Tylenol.  Recommend Tylenol and ice as needed for pain.  No indication for imaging as have low suspicion for fracture.  After history, exam, and medical workup I feel the patient has been appropriately medically screened and is safe for discharge home. Pertinent diagnoses were discussed with the patient. Patient was given return precautions.   Final Clinical Impression(s) / ED Diagnoses Final diagnoses:  Chronic pain of left knee    Rx / DC Orders ED Discharge Orders    None       [name], MD 12/13/19 [phone]

## 2019-12-13 NOTE — Discharge Instructions (Addendum)
You were seen today for knee pain.  Take Tylenol as needed for pain.

## 2019-12-14 ENCOUNTER — Emergency Department (HOSPITAL_COMMUNITY)
Admission: EM | Admit: 2019-12-14 | Discharge: 2019-12-14 | Disposition: A | Discharge status: Home / Self Care | Payer: Medicare Other | Attending: Emergency Medicine | Admitting: Emergency Medicine

## 2019-12-14 ENCOUNTER — Emergency Department (HOSPITAL_COMMUNITY)
Admission: EM | Admit: 2019-12-14 | Discharge: 2019-12-14 | Disposition: A | Discharge status: Home / Self Care | Payer: Medicare Other | Source: Home / Self Care

## 2019-12-14 ENCOUNTER — Encounter (HOSPITAL_COMMUNITY): Payer: Self-pay | Admitting: Emergency Medicine

## 2019-12-14 ENCOUNTER — Other Ambulatory Visit: Payer: Self-pay

## 2019-12-14 DIAGNOSIS — G8929 Other chronic pain: Secondary | ICD-10-CM | POA: Insufficient documentation

## 2019-12-14 DIAGNOSIS — Z79899 Other long term (current) drug therapy: Secondary | ICD-10-CM | POA: Insufficient documentation

## 2019-12-14 DIAGNOSIS — M25562 Pain in left knee: Secondary | ICD-10-CM | POA: Insufficient documentation

## 2019-12-14 DIAGNOSIS — F17210 Nicotine dependence, cigarettes, uncomplicated: Secondary | ICD-10-CM | POA: Diagnosis not present

## 2019-12-14 DIAGNOSIS — C182 Malignant neoplasm of ascending colon: Secondary | ICD-10-CM | POA: Diagnosis not present

## 2019-12-14 DIAGNOSIS — Z5321 Procedure and treatment not carried out due to patient leaving prior to being seen by health care provider: Secondary | ICD-10-CM | POA: Insufficient documentation

## 2019-12-14 DIAGNOSIS — F200 Paranoid schizophrenia: Secondary | ICD-10-CM | POA: Insufficient documentation

## 2019-12-14 DIAGNOSIS — I10 Essential (primary) hypertension: Secondary | ICD-10-CM | POA: Insufficient documentation

## 2019-12-14 MED ORDER — ACETAMINOPHEN 325 MG PO TABS
650.0000 mg | ORAL_TABLET | Freq: Once | ORAL | Status: AC
  Administered 2019-12-14: 650 mg via ORAL
  Filled 2019-12-14: qty 2

## 2019-12-14 NOTE — Discharge Instructions (Signed)
Thank you for allowing me to care for you today in the Emergency Department.   Try to elevate your left leg so that your toes are at or above the level of your nose.  Apply ice packs for 15 to 20 minutes as frequently as needed for the next few days.  You can take 650 mg of Tylenol once every 6 hours.  Keep your follow-up appointment with orthopedics.  Return to the emergency department if you develop fevers, if the knee gets red, hot, and swollen, if you develop new numbness or weakness, if you become unable to walk, or any other new, concerning symptoms.

## 2019-12-14 NOTE — ED Provider Notes (Signed)
[facility] EMERGENCY DEPARTMENT Provider Note   CSN: [id number] Arrival date & time: 12/14/19  2205     History Chief Complaint  Patient presents with  . Knee Pain    [name] is a 69 y.o. male with history of paranoid schizophrenia, homelessness, hyperlipidemia who presents to the emergency department with a chief complaint of left knee pain.  The patient states that he tripped and fell on the sidewalk and landed on his left knee 3 days ago.  He has been ambulatory.  No numbness or weakness.  No left ankle or hip pain.  No fevers or chills.  He reports that he has a history of chronic knee pain and is followed by orthopedics.  His next appointment is next month.  Patient is well known to this emergency department with 28 visits over the last 6 months.  This is his third visit for the same complaint in the last 8 days.   The history is provided by the patient. No language interpreter was used.       Past Medical History:  Diagnosis Date  . BPH (benign prostatic hyperplasia)   . Colon polyp 2010  . Depression   . GERD (gastroesophageal reflux disease)   . Hepatitis C    "caught it when I had a blood transfusion"  . High cholesterol   . History of blood transfusion    "when I was young"  . Hypertension   . Paranoid schizophrenia ([place])   . Prostate atrophy   . Retinal vein occlusion     Patient Active Problem List   Diagnosis Date Noted  . GERD (gastroesophageal reflux disease) 05/03/2019  . Hypotension 08/02/2018  . Low back sprain, initial encounter 10/21/2017  . History of drug abuse ([place]) 10/18/2017  . Gingival erythema 09/23/2017  . Chronic pain of left knee 11/24/2016  . Tobacco abuse   . Diastolic heart failure ([place]) 02/05/2015  . Healthcare maintenance 05/28/2014  . Hyperlipidemia 03/10/2014  . Patellar tendonitis 11/16/2012  . [place] OCCLUSION 06/03/2010  . PARANOID SCHIZOPHRENIA, CHRONIC 01/17/2009  . HYPERTENSION,  BENIGN ESSENTIAL 01/17/2009  . BPH (benign prostatic hyperplasia) 01/17/2009  . HEPATITIS C 12/14/2008  . DEPRESSION 12/14/2008    Past Surgical History:  Procedure Laterality Date  . CIRCUMCISION  1959  . TONSILLECTOMY         Family History  Problem Relation Age of Onset  . Hypertension Mother   . Diabetes Mother   . Stroke Mother     Social History   Tobacco Use  . Smoking status: Current Every Day Smoker    Packs/day: 0.50    Years: 48.00    Pack years: 24.00    Types: Cigarettes  . Smokeless tobacco: Never Used  Substance Use Topics  . Alcohol use: No  . Drug use: No    Home Medications Prior to Admission medications   Medication Sig Start Date End Date Taking? Authorizing Provider  acetaminophen (TYLENOL) 500 MG tablet Take 1 tablet (500 mg total) by mouth every 6 (six) hours as needed. 04/23/18   [name], [name], PA-C  amLODipine (NORVASC) 10 MG tablet Take 1 tablet (10 mg total) by mouth daily. 04/13/18   [name], DO  aspirin 81 MG tablet Take 1 tablet (81 mg total) by mouth daily. 09/24/14   [name], MD  atorvastatin (LIPITOR) 40 MG tablet Take 1 tablet (40 mg total) by mouth daily. 08/15/19   [name], DO  benztropine (COGENTIN) 1 MG tablet Take 1 tablet (1 mg total) by mouth 2 (two) times daily. 11/05/16   [name], MD  cetirizine (ZYRTEC ALLERGY) 10 MG tablet Take 1 tablet (10 mg total) by mouth daily. 12/09/19   [name], PA-C  famotidine (PEPCID) 20 MG tablet Take 1 tablet (20 mg total) by mouth 2 (two) times daily. 05/02/19   [name], DO  fluPHENAZine decanoate (PROLIXIN) 25 MG/ML injection Inject 25 mg into the muscle every 14 (fourteen) days. Last dose 09/07/12    [provider]  fluticasone (FLONASE) 50 MCG/ACT nasal spray Place 2 sprays into both nostrils daily. 12/11/19   Fatima Blank, MD  losartan (COZAAR) 100 MG tablet Take 1 tablet (100 mg total) by mouth daily. 11/28/19   [name], DO    Multiple Vitamin (MULTIVITAMIN WITH MINERALS) TABS tablet Take 1 tablet by mouth daily. 06/28/14   [name], MD  sodium chloride (OCEAN) 0.65 % SOLN nasal spray Place 1 spray into both nostrils as needed for congestion. 11/19/19   [name], PA-C  tamsulosin (FLOMAX) 0.4 MG CAPS capsule Take 2 capsules (0.8 mg total) by mouth daily. 11/27/17   [name], MD  tamsulosin (FLOMAX) 0.4 MG CAPS capsule TAKE 2 CAPSULE BY MOUTH DAILY 04/17/19   [name], DO  traMADol (ULTRAM) 50 MG tablet Take 1 tablet (50 mg total) by mouth every 6 (six) hours as needed. 07/04/18   [name], MD  triamcinolone cream (KENALOG) 0.1 % Apply 1 application topically 2 (two) times daily. For 5 days, then twice daily as needed for rash and itching. 01/03/15   [name], MD  vitamin C (ASCORBIC ACID) 500 MG tablet Take 500 mg by mouth daily.    [provider]  loratadine (CLARITIN) 10 MG tablet Take 1 tablet (10 mg total) by mouth daily. 11/09/17 11/19/19  Couture, Cortni S, PA-C    Allergies    Lisinopril, Penicillins, Amoxicillin, and Ibuprofen  Review of Systems   Review of Systems  Constitutional: Negative for activity change, chills and fever.  Respiratory: Negative for shortness of breath.   Cardiovascular: Negative for chest pain.  Gastrointestinal: Negative for abdominal pain.  Musculoskeletal: Positive for arthralgias and myalgias. Negative for back pain, gait problem, joint swelling and neck stiffness.  Skin: Negative for color change, rash and wound.  Neurological: Negative for weakness and numbness.    Physical Exam Updated Vital Signs BP (!) 160/94 (BP Location: Right Arm)   Pulse 82   Temp 98.6 F (37 C) (Oral)   Resp 14   SpO2 97%   Physical Exam Vitals and nursing note reviewed.  Constitutional:      General: He is not in acute distress.    Appearance: He is well-developed. He is not ill-appearing, toxic-appearing or diaphoretic.      Comments: Well-appearing.  No acute distress.  HENT:     Head: Normocephalic.  Eyes:     Conjunctiva/sclera: Conjunctivae normal.  Cardiovascular:     Rate and Rhythm: Normal rate and regular rhythm.     Heart sounds: No murmur.  Pulmonary:     Effort: Pulmonary effort is normal.  Abdominal:     General: There is no distension.     Palpations: Abdomen is soft.  Musculoskeletal:     Cervical back: Neck supple.     Right lower leg: No edema.     Left lower leg: No edema.     Comments: No focal  tenderness noted to the left knee.  No medial or joint line tenderness.  No tenderness over the tibial plateau.  Negative anterior and posterior drawer test.  Negative valgus and varus stress test.  Full active and passive range of motion.  Normal exam of the left knee and ankle.  There is no overlying edema, redness, warmth, or obvious wounds.  Ambulates independently.  Gait is not ataxic or antalgic.  Skin:    General: Skin is warm and dry.  Neurological:     Mental Status: He is alert.  Psychiatric:        Behavior: Behavior normal.     ED Results / Procedures / Treatments   Labs (all labs ordered are listed, but only abnormal results are displayed) Labs Reviewed - No data to display  EKG None  Radiology No results found.  Procedures Procedures (including critical care time)  Medications Ordered in ED Medications  acetaminophen (TYLENOL) tablet 650 mg (has no administration in time range)    ED Course  I have reviewed the triage vital signs and the nursing notes.  Pertinent labs & imaging results that were available during my care of the patient were reviewed by me and considered in my medical decision making (see chart for details).    MDM Rules/Calculators/A&P                      69 year old male with history of paranoid schizophrenia, homelessness, hyperlipidemia who presents to the emergency department with left knee pain.  The patient is well-known to the emergency  department with 28 visits over the last 6 months.  This is his third visit for left knee pain over the last 8 days.  Exam of the left knee is unremarkable.  Normal exam of the left hip and ankle.  Low suspicion for septic joint, gout, septic arthritis, or fracture of the left knee.  The patient has had multiple images of the left knee performed in the ER, most recently on April 7.  Given his exam, repeat imaging is not indicated at this time.  We will give the patient a dose of Tylenol and recommend continued follow-up with orthopedics.  ER return precautions given.  He is hemodynamically stable and in no acute distress.  Safe for discharge home with outpatient follow-up as indicated.  Final Clinical Impression(s) / ED Diagnoses Final diagnoses:  Chronic pain of left knee    Rx / DC Orders ED Discharge Orders    None       Hobart Marte A, PA-C 12/14/19 2237    Lucrezia Starch, MD 12/16/19 1144

## 2019-12-14 NOTE — ED Notes (Addendum)
Patient verbalizes understanding of discharge instructions. Opportunity for questioning and answers were provided. Armband removed by staff, pt discharged from ED. Pt refused to sign discharge. Pt is discharged, but is not leaving at this time.

## 2019-12-14 NOTE — ED Triage Notes (Signed)
C/o chronic L knee pain x 3 days.  Pt seen in ED multiple times for same.

## 2019-12-14 NOTE — Addendum Note (Signed)
Addended by: Casimer Lanius H on: 12/14/2019 09:40 AM   Modules accepted: Orders

## 2019-12-14 NOTE — ED Triage Notes (Signed)
Pt c/o left knee pain x 2 days. Ambulatory without difficulty.

## 2019-12-15 ENCOUNTER — Telehealth: Payer: Self-pay | Admitting: Family Medicine

## 2019-12-15 NOTE — Telephone Encounter (Signed)
   SF 12/15/2019   Name: [name]   MRN: [id number]   DOB: 28-Jul-1951   AGE: 69 y.o.   GENDER: male   PCP [name], DO.   Called pt regarding [name] Referral for housing assistance. Care Guide tried to call number provided in the chart several times, but the number is not available. Will try to find other ways to connect with patient.    [place], Care Management Phone: [phone] Email: [email or web address]

## 2019-12-15 NOTE — Telephone Encounter (Signed)
Per chart notes from Casimer Lanius, LCSW, patient was determined to be homeless on 12/04/2019. Referral sent to Care Guide on 12/14/19.   Social Work Care Management  Referral Note  12/13/2019 Name: [name]  MRN: [id number]       DOB: 08-20-1950  [name] is a 69 y.o. year old male who sees [name], DO for primary care.  LCSW was consulted by PCP to assistance patient with [employer] for housing due to being homeless. PCP also indicated difficulty with reaching patient for ongoing care. Patient seen by inpatient social worker on December 04, 2019 and was provided with housing resources.   Review of patient status, including review of consultants reports, relevant laboratory and other test results, as well as collaboration with appropriate care team members,  and the patient's provider was performed as part of comprehensive patient evaluation and provision of chronic care management services.    Recommendation: LCSW reviewed referral and based on the chart review determined that housing seeming to be the top priority at this time for patient.  Last visit with PCP was Sep. 2020.  Patient may have unmet clinical needs but main focus is housing.  Patient's needs are best met by CCM care guides.  Plan:  1. Patient is being referred to the Care Guide for assistance with community resources for housing resources.  2.   The Care Guide will contact the Care Management team if clinical needs are identified during their encounter.   Casimer Lanius, [place] / [place]   [phone] 2:21 PM

## 2019-12-17 ENCOUNTER — Emergency Department (HOSPITAL_COMMUNITY)
Admission: EM | Admit: 2019-12-17 | Discharge: 2019-12-18 | Disposition: A | Discharge status: Home / Self Care | Payer: Medicare Other | Attending: Emergency Medicine | Admitting: Emergency Medicine

## 2019-12-17 ENCOUNTER — Encounter (HOSPITAL_COMMUNITY): Payer: Self-pay | Admitting: Emergency Medicine

## 2019-12-17 ENCOUNTER — Other Ambulatory Visit: Payer: Self-pay

## 2019-12-17 DIAGNOSIS — Z5321 Procedure and treatment not carried out due to patient leaving prior to being seen by health care provider: Secondary | ICD-10-CM | POA: Insufficient documentation

## 2019-12-17 DIAGNOSIS — M25562 Pain in left knee: Secondary | ICD-10-CM | POA: Insufficient documentation

## 2019-12-17 NOTE — ED Notes (Signed)
Called pts name x3 for updated VS with no response.

## 2019-12-17 NOTE — ED Notes (Signed)
Pt assumed to have left. Pulled pt off floor.

## 2019-12-17 NOTE — ED Triage Notes (Signed)
Pt c/o chronic left knee pain, ambulatory without difficulty.

## 2019-12-17 NOTE — ED Notes (Signed)
Called pts name for VS 3x with no response.

## 2019-12-18 ENCOUNTER — Encounter (HOSPITAL_COMMUNITY): Payer: Self-pay | Admitting: Emergency Medicine

## 2019-12-18 ENCOUNTER — Emergency Department (HOSPITAL_COMMUNITY)
Admission: EM | Admit: 2019-12-18 | Discharge: 2019-12-18 | Disposition: A | Discharge status: Home / Self Care | Payer: Medicare Other | Source: Home / Self Care | Attending: Emergency Medicine | Admitting: Emergency Medicine

## 2019-12-18 ENCOUNTER — Telehealth: Payer: Self-pay | Admitting: Family Medicine

## 2019-12-18 ENCOUNTER — Other Ambulatory Visit: Payer: Self-pay

## 2019-12-18 DIAGNOSIS — Z7982 Long term (current) use of aspirin: Secondary | ICD-10-CM | POA: Insufficient documentation

## 2019-12-18 DIAGNOSIS — I10 Essential (primary) hypertension: Secondary | ICD-10-CM | POA: Insufficient documentation

## 2019-12-18 DIAGNOSIS — M25562 Pain in left knee: Secondary | ICD-10-CM

## 2019-12-18 DIAGNOSIS — Z79899 Other long term (current) drug therapy: Secondary | ICD-10-CM | POA: Insufficient documentation

## 2019-12-18 DIAGNOSIS — F17210 Nicotine dependence, cigarettes, uncomplicated: Secondary | ICD-10-CM | POA: Insufficient documentation

## 2019-12-18 NOTE — Discharge Instructions (Addendum)
You may alternate Tylenol 1000 mg every 6 hours as needed for pain and Ibuprofen 800 mg every 8 hours as needed for pain.  Please take Ibuprofen with food.  Do not take more than 4000 mg of Tylenol (acetaminophen) in a 24 hour period.     Steps to find a Primary Care Provider (PCP):  Call [phone] or [phone] to access "[place] [facility] Find a Doctor Service."  2.  You may also go on the [facility] website at [email or web address]  3.  [facility] also frequently accepts new patients.  [facility]  [address] [place] [address] [phone]  4.  There are also multiple [facility], [facility] and [facility] practices throughout the [place] that are frequently accepting new patients. You may find a clinic that is close to your home and contact them.  [email or web address] [phone]  [facility] [email or web address]  [email or web address] [phone]  [email or web address] [phone]  5.  Local Health Departments also can provide primary care services.  [facility]  [address] [place] [address] [phone]  [facility] [address] [place] [address] [phone]  [facility] [address]  [place] [address] [phone]   

## 2019-12-18 NOTE — ED Provider Notes (Signed)
TIME SEEN: 3:32 AM  CHIEF COMPLAINT: left knee pain  HPI: Patient here with left knee pain.  He has history of paranoid schizophrenia and is well-known to our emergency department.  No injury to the knee.  Ambulating without difficulty.  No fever.  States he has chronic knee pain.  No medications tried prior to arrival.  ROS: See HPI Constitutional: no fever  Eyes: no drainage  ENT: no runny nose   Cardiovascular:  no chest pain  Resp: no SOB  GI: no vomiting GU: no dysuria Integumentary: no rash  Allergy: no hives  Musculoskeletal: no leg swelling  Neurological: no slurred speech ROS otherwise negative  PAST MEDICAL HISTORY/PAST SURGICAL HISTORY:  Past Medical History:  Diagnosis Date  . BPH (benign prostatic hyperplasia)   . Colon polyp 2010  . Depression   . GERD (gastroesophageal reflux disease)   . Hepatitis C    "caught it when I had a blood transfusion"  . High cholesterol   . History of blood transfusion    "when I was young"  . Hypertension   . Paranoid schizophrenia ([place])   . Prostate atrophy   . Retinal vein occlusion     MEDICATIONS:  Prior to Admission medications   Medication Sig Start Date End Date Taking? Authorizing Provider  acetaminophen (TYLENOL) 500 MG tablet Take 1 tablet (500 mg total) by mouth every 6 (six) hours as needed. 04/23/18   [name], [name], PA-C  amLODipine (NORVASC) 10 MG tablet Take 1 tablet (10 mg total) by mouth daily. 04/13/18   [name], DO  aspirin 81 MG tablet Take 1 tablet (81 mg total) by mouth daily. 09/24/14   [name], MD  atorvastatin (LIPITOR) 40 MG tablet Take 1 tablet (40 mg total) by mouth daily. 08/15/19   [name], DO  benztropine (COGENTIN) 1 MG tablet Take 1 tablet (1 mg total) by mouth 2 (two) times daily. 11/05/16   [name], MD  cetirizine (ZYRTEC ALLERGY) 10 MG tablet Take 1 tablet (10 mg total) by mouth daily. 12/09/19   [name], PA-C  famotidine (PEPCID) 20 MG tablet Take 1  tablet (20 mg total) by mouth 2 (two) times daily. 05/02/19   [name], DO  fluPHENAZine decanoate (PROLIXIN) 25 MG/ML injection Inject 25 mg into the muscle every 14 (fourteen) days. Last dose 09/07/12    [provider]  fluticasone (FLONASE) 50 MCG/ACT nasal spray Place 2 sprays into both nostrils daily. 12/11/19   Fatima Blank, MD  losartan (COZAAR) 100 MG tablet Take 1 tablet (100 mg total) by mouth daily. 11/28/19   [name], DO  Multiple Vitamin (MULTIVITAMIN WITH MINERALS) TABS tablet Take 1 tablet by mouth daily. 06/28/14   [name], MD  sodium chloride (OCEAN) 0.65 % SOLN nasal spray Place 1 spray into both nostrils as needed for congestion. 11/19/19   [name], PA-C  tamsulosin (FLOMAX) 0.4 MG CAPS capsule Take 2 capsules (0.8 mg total) by mouth daily. 11/27/17   [name], MD  tamsulosin (FLOMAX) 0.4 MG CAPS capsule TAKE 2 CAPSULE BY MOUTH DAILY 04/17/19   [name], DO  traMADol (ULTRAM) 50 MG tablet Take 1 tablet (50 mg total) by mouth every 6 (six) hours as needed. 07/04/18   [name], MD  triamcinolone cream (KENALOG) 0.1 % Apply 1 application topically 2 (two) times daily. For 5 days, then twice daily as needed for rash and itching. 01/03/15   [name], MD  vitamin C (ASCORBIC ACID) 500 MG tablet Take 500 mg by mouth daily.    [provider]  loratadine (CLARITIN) 10 MG tablet Take 1 tablet (10 mg total) by mouth daily. 11/09/17 11/19/19  Couture, Cortni S, PA-C    ALLERGIES:  Allergies  Allergen Reactions  . Lisinopril Other (See Comments)    Cough  . Penicillins Hives  . Amoxicillin Rash  . Ibuprofen Rash    SOCIAL HISTORY:  Social History   Tobacco Use  . Smoking status: Current Every Day Smoker    Packs/day: 0.50    Years: 48.00    Pack years: 24.00    Types: Cigarettes  . Smokeless tobacco: Never Used  Substance Use Topics  . Alcohol use: No    FAMILY HISTORY: Family  History  Problem Relation Age of Onset  . Hypertension Mother   . Diabetes Mother   . Stroke Mother     EXAM: BP (!) 147/100 (BP Location: Right Arm)   Pulse 78   Temp 98.8 F (37.1 C) (Oral)   Resp 18   Ht 6' (1.829 m)   Wt 70 kg   SpO2 100%   BMI 20.93 kg/m  CONSTITUTIONAL: Alert and oriented and responds appropriately to questions. Well-appearing; well-nourished, smiling, laughing HEAD: Normocephalic EYES: Conjunctivae clear, pupils appear equal, EOM appear intact ENT: normal nose; moist mucous membranes NECK: Supple, normal ROM CARD: RRR; S1 and S2 appreciated; no murmurs, no clicks, no rubs, no gallops RESP: Normal chest excursion without splinting or tachypnea; breath sounds clear and equal bilaterally; no wheezes, no rhonchi, no rales, no hypoxia or respiratory distress, speaking full sentences ABD/GI: Normal bowel sounds; non-distended; soft, non-tender, no rebound, no guarding, no peritoneal signs, no hepatosplenomegaly BACK:  The back appears normal EXT: Normal ROM in all joints; no deformity noted, no edema; no cyanosis, no left knee tenderness on exam, no joint effusion, no redness or warmth, no calf tenderness or calf swelling, no ligamentous laxity, extremity warm and well-perfused, compartments soft, ambulates with quick and steady gait SKIN: Normal color for age and race; warm; no rash on exposed skin NEURO: Moves all extremities equally PSYCH: The patient's mood and manner are appropriate.   MEDICAL DECISION MAKING: Patient here with left knee pain with benign exam.  History of paranoid schizophrenia and is well-known to our emergency department.  Patient is here almost every day if not multiple times a day.  I suspect there is some component of malingering.  He is walking here, smiling and laughing.  No sign of gout, septic arthritis, compartment syndrome, DVT, arterial obstruction, fracture or other emergent abnormality today.  I feel he is safe for discharge.   Given outpatient follow-up.  At this time, I do not feel there is any life-threatening condition present. I have reviewed, interpreted and discussed all results (EKG, imaging, lab, urine as appropriate) and exam findings with patient/family. I have reviewed nursing notes and appropriate previous records.  I feel the patient is safe to be discharged home without further emergent workup and can continue workup as an outpatient as needed. Discussed usual and customary return precautions. Patient/family verbalize understanding and are comfortable with this plan.  Outpatient follow-up has been provided as needed. All questions have been answered.   [name] was evaluated in Emergency Department on 12/18/2019 for the symptoms described in the history of present illness. He was evaluated in the context of the global COVID-19 pandemic, which necessitated consideration that the patient might be at risk  for infection with the SARS-CoV-2 virus that causes COVID-19. Institutional protocols and algorithms that pertain to the evaluation of patients at risk for COVID-19 are in a state of rapid change based on information released by regulatory bodies including the CDC and federal and state organizations. These policies and algorithms were followed during the patient's care in the ED.      Hezekiah Veltre, Delice Bison, DO 12/18/19 [phone]

## 2019-12-18 NOTE — ED Triage Notes (Signed)
Patient reports bilateral knee pain for several days , denies injury or fall /ambulatory .

## 2019-12-18 NOTE — Telephone Encounter (Signed)
   SF 12/18/2019   Name: [name]   MRN: [id number]   DOB: Jun 09, 1951   AGE: 69 y.o.   GENDER: male   PCP [name], DO.   Called pt regarding [name] Referral for housing assistance. Patient did not answer, could not leave voicemail due to voicemail not being set up.    [place], Care Management Phone: [phone] Email: [email or web address]

## 2019-12-18 NOTE — Telephone Encounter (Signed)
Called the [facility] in [place]. This organization provides shelter and other resources for the homeless . Left message for a staff member to see if they have any availability at this time.

## 2019-12-18 NOTE — Telephone Encounter (Signed)
[facility]. Spoke with receptionist that stated to give office a call back in the morning to speak with Caseworker because individuals must quarantine before they can enter the shelter due to [place]. Care Guide will call back on 12/19/19.

## 2019-12-19 ENCOUNTER — Emergency Department (HOSPITAL_COMMUNITY)
Admission: EM | Admit: 2019-12-19 | Discharge: 2019-12-19 | Disposition: A | Discharge status: Home / Self Care | Payer: Medicare Other | Attending: Emergency Medicine | Admitting: Emergency Medicine

## 2019-12-19 DIAGNOSIS — B182 Chronic viral hepatitis C: Secondary | ICD-10-CM | POA: Insufficient documentation

## 2019-12-19 DIAGNOSIS — G8929 Other chronic pain: Secondary | ICD-10-CM | POA: Insufficient documentation

## 2019-12-19 DIAGNOSIS — M25562 Pain in left knee: Secondary | ICD-10-CM | POA: Diagnosis present

## 2019-12-19 DIAGNOSIS — I10 Essential (primary) hypertension: Secondary | ICD-10-CM | POA: Insufficient documentation

## 2019-12-19 DIAGNOSIS — Z79899 Other long term (current) drug therapy: Secondary | ICD-10-CM | POA: Diagnosis not present

## 2019-12-19 DIAGNOSIS — F17210 Nicotine dependence, cigarettes, uncomplicated: Secondary | ICD-10-CM | POA: Diagnosis not present

## 2019-12-19 NOTE — Telephone Encounter (Signed)
Called the [facility]. Left message for caseworker to return call to Care Guide regarding any resources that might be available to help [name].

## 2019-12-19 NOTE — ED Provider Notes (Signed)
[facility] EMERGENCY DEPARTMENT Provider Note   CSN: [id number] Arrival date & time: 12/19/19  0344     History Chief Complaint  Patient presents with  . Knee Pain    [name] is a 69 y.o. male.  Patient to ED with complaint of left knee pain. He has taken Tylenol for symptoms without relief. He reports knee pain for "years". No new injury, no other complaints.   The history is provided by the patient. No language interpreter was used.       Past Medical History:  Diagnosis Date  . BPH (benign prostatic hyperplasia)   . Colon polyp 2010  . Depression   . GERD (gastroesophageal reflux disease)   . Hepatitis C    "caught it when I had a blood transfusion"  . High cholesterol   . History of blood transfusion    "when I was young"  . Hypertension   . Paranoid schizophrenia ([place])   . Prostate atrophy   . Retinal vein occlusion     Patient Active Problem List   Diagnosis Date Noted  . GERD (gastroesophageal reflux disease) 05/03/2019  . Hypotension 08/02/2018  . Low back sprain, initial encounter 10/21/2017  . History of drug abuse ([place]) 10/18/2017  . Gingival erythema 09/23/2017  . Chronic pain of left knee 11/24/2016  . Tobacco abuse   . Diastolic heart failure ([place]) 02/05/2015  . Healthcare maintenance 05/28/2014  . Hyperlipidemia 03/10/2014  . Patellar tendonitis 11/16/2012  . [place] OCCLUSION 06/03/2010  . PARANOID SCHIZOPHRENIA, CHRONIC 01/17/2009  . HYPERTENSION, BENIGN ESSENTIAL 01/17/2009  . BPH (benign prostatic hyperplasia) 01/17/2009  . HEPATITIS C 12/14/2008  . DEPRESSION 12/14/2008    Past Surgical History:  Procedure Laterality Date  . CIRCUMCISION  1959  . TONSILLECTOMY         Family History  Problem Relation Age of Onset  . Hypertension Mother   . Diabetes Mother   . Stroke Mother     Social History   Tobacco Use  . Smoking status: Current Every Day Smoker    Packs/day: 0.50    Years:  48.00    Pack years: 24.00    Types: Cigarettes  . Smokeless tobacco: Never Used  Substance Use Topics  . Alcohol use: No  . Drug use: No    Home Medications Prior to Admission medications   Medication Sig Start Date End Date Taking? Authorizing Provider  acetaminophen (TYLENOL) 500 MG tablet Take 1 tablet (500 mg total) by mouth every 6 (six) hours as needed. 04/23/18   [name], [name], PA-C  amLODipine (NORVASC) 10 MG tablet Take 1 tablet (10 mg total) by mouth daily. 04/13/18   [name], DO  aspirin 81 MG tablet Take 1 tablet (81 mg total) by mouth daily. 09/24/14   [name], MD  atorvastatin (LIPITOR) 40 MG tablet Take 1 tablet (40 mg total) by mouth daily. 08/15/19   [name], DO  benztropine (COGENTIN) 1 MG tablet Take 1 tablet (1 mg total) by mouth 2 (two) times daily. 11/05/16   [name], MD  cetirizine (ZYRTEC ALLERGY) 10 MG tablet Take 1 tablet (10 mg total) by mouth daily. 12/09/19   [name], PA-C  famotidine (PEPCID) 20 MG tablet Take 1 tablet (20 mg total) by mouth 2 (two) times daily. 05/02/19   [name], DO  fluPHENAZine decanoate (PROLIXIN) 25 MG/ML injection Inject 25 mg into the muscle every 14 (fourteen) days. Last dose 09/07/12  [provider]  fluticasone (FLONASE) 50 MCG/ACT nasal spray Place 2 sprays into both nostrils daily. 12/11/19   Fatima Blank, MD  losartan (COZAAR) 100 MG tablet Take 1 tablet (100 mg total) by mouth daily. 11/28/19   [name], DO  Multiple Vitamin (MULTIVITAMIN WITH MINERALS) TABS tablet Take 1 tablet by mouth daily. 06/28/14   [name], MD  sodium chloride (OCEAN) 0.65 % SOLN nasal spray Place 1 spray into both nostrils as needed for congestion. 11/19/19   [name], PA-C  tamsulosin (FLOMAX) 0.4 MG CAPS capsule Take 2 capsules (0.8 mg total) by mouth daily. 11/27/17   [name], MD  tamsulosin (FLOMAX) 0.4 MG CAPS capsule TAKE 2 CAPSULE BY MOUTH DAILY  04/17/19   [name], DO  traMADol (ULTRAM) 50 MG tablet Take 1 tablet (50 mg total) by mouth every 6 (six) hours as needed. 07/04/18   [name], MD  triamcinolone cream (KENALOG) 0.1 % Apply 1 application topically 2 (two) times daily. For 5 days, then twice daily as needed for rash and itching. 01/03/15   [name], MD  vitamin C (ASCORBIC ACID) 500 MG tablet Take 500 mg by mouth daily.    [provider]  loratadine (CLARITIN) 10 MG tablet Take 1 tablet (10 mg total) by mouth daily. 11/09/17 11/19/19  Couture, Cortni S, PA-C    Allergies    Lisinopril, Penicillins, Amoxicillin, and Ibuprofen  Review of Systems   Review of Systems  Musculoskeletal:       See HPI  Skin: Negative.   Neurological: Negative.  Negative for weakness.    Physical Exam Updated Vital Signs BP (!) 146/92 (BP Location: Right Arm)   Pulse 63   Temp 98.2 F (36.8 C) (Oral)   Resp 16   SpO2 100%   Physical Exam Vitals and nursing note reviewed.  Constitutional:      Appearance: He is well-developed.  Pulmonary:     Effort: Pulmonary effort is normal.  Musculoskeletal:        General: Normal range of motion.     Cervical back: Normal range of motion.     Comments: No significant swelling about the left knee. FROM. Ambulatory and fully weight bearing without limitation.   Skin:    General: Skin is warm and dry.  Neurological:     Mental Status: He is alert and oriented to person, place, and time.     ED Results / Procedures / Treatments   Labs (all labs ordered are listed, but only abnormal results are displayed) Labs Reviewed - No data to display  EKG None  Radiology No results found.  Procedures Procedures (including critical care time)  Medications Ordered in ED Medications - No data to display  ED Course  I have reviewed the triage vital signs and the nursing notes.  Pertinent labs & imaging results that were available during my care of the patient were  reviewed by me and considered in my medical decision making (see chart for details).    MDM Rules/Calculators/A&P                      Patient well known to the ED with 31 visits in the last 6 months, frequently for left knee pain. Last seen 24 hours prior to current visit.   He has chronic pain and is without new or concerning finding tonight. He can be discharged home and is encouraged to see his regular doctor  for management of chronic pain.   Final Clinical Impression(s) / ED Diagnoses Final diagnoses:  None   1. Chronic knee pain  Rx / DC Orders ED Discharge Orders    None       Charlann Lange, PA-C 12/19/19 [id number]    [name], MD 12/19/19 [phone]

## 2019-12-19 NOTE — Discharge Instructions (Signed)
PLEASE SEE YOUR DOCTOR FOR CHRONIC PAIN.

## 2019-12-19 NOTE — ED Triage Notes (Signed)
Pt reports chronic knee pain that he is seen for daily

## 2019-12-19 NOTE — Telephone Encounter (Signed)
[name]. Left message for Caseworker to see if they currently have any availability at the [facility].

## 2019-12-20 ENCOUNTER — Telehealth: Payer: Self-pay | Admitting: Family Medicine

## 2019-12-20 ENCOUNTER — Emergency Department (HOSPITAL_COMMUNITY)
Admission: EM | Admit: 2019-12-20 | Discharge: 2019-12-20 | Disposition: A | Discharge status: Home / Self Care | Payer: Medicare Other | Attending: Emergency Medicine | Admitting: Emergency Medicine

## 2019-12-20 ENCOUNTER — Other Ambulatory Visit: Payer: Self-pay

## 2019-12-20 ENCOUNTER — Encounter (HOSPITAL_COMMUNITY): Payer: Self-pay | Admitting: Emergency Medicine

## 2019-12-20 DIAGNOSIS — Z79899 Other long term (current) drug therapy: Secondary | ICD-10-CM | POA: Diagnosis not present

## 2019-12-20 DIAGNOSIS — F17210 Nicotine dependence, cigarettes, uncomplicated: Secondary | ICD-10-CM | POA: Insufficient documentation

## 2019-12-20 DIAGNOSIS — I10 Essential (primary) hypertension: Secondary | ICD-10-CM | POA: Diagnosis not present

## 2019-12-20 DIAGNOSIS — Z765 Malingerer [conscious simulation]: Secondary | ICD-10-CM | POA: Insufficient documentation

## 2019-12-20 DIAGNOSIS — Z5900 Homelessness unspecified: Secondary | ICD-10-CM

## 2019-12-20 DIAGNOSIS — R0981 Nasal congestion: Secondary | ICD-10-CM | POA: Diagnosis present

## 2019-12-20 NOTE — ED Triage Notes (Signed)
Patient reports nasal congestion today .

## 2019-12-20 NOTE — Telephone Encounter (Signed)
   SF 12/20/2019   Name: [name]   MRN: [id number]   DOB: 11/29/1950   AGE: 69 y.o.   GENDER: male   PCP [name], DO.   Called pt regarding [name] Referral for housing. Mr. Fugitt did not answer and could not leave a voicemail. Will continue to try to get in contact with patient.    [place], Care Management Phone: [phone] Email: [email or web address]

## 2019-12-20 NOTE — Discharge Instructions (Addendum)
You were seen again today in the ED.  The emergency room is not a homeless shelter.  You need to proceed to the nearest homeless shelter for a place to sleep.

## 2019-12-20 NOTE — ED Provider Notes (Signed)
[facility] EMERGENCY DEPARTMENT Provider Note   CSN: [id number] Arrival date & time: 12/20/19  0034     History Chief Complaint  Patient presents with  . Nasal Congestion    [name] is a 69 y.o. male.  HPI     This is a 69 year old male who presented to triage with complaint of nasal congestion.  On my evaluation he is sleeping.  When asked why he came today, he cannot tell me.  I asked him if he needed a place to sleep and he stated "I just need a place to lay down."  He reports that he is homeless.  He has had multiple evaluations including 2 in the last 24 hours for minor chronic complaints.  He is well-known to our emergency department.  He completely denies any physical complaints including chest pain, shortness breath, abdominal pain, nausea, vomiting.  Past Medical History:  Diagnosis Date  . BPH (benign prostatic hyperplasia)   . Colon polyp 2010  . Depression   . GERD (gastroesophageal reflux disease)   . Hepatitis C    "caught it when I had a blood transfusion"  . High cholesterol   . History of blood transfusion    "when I was young"  . Hypertension   . Paranoid schizophrenia ([place])   . Prostate atrophy   . Retinal vein occlusion     Patient Active Problem List   Diagnosis Date Noted  . GERD (gastroesophageal reflux disease) 05/03/2019  . Hypotension 08/02/2018  . Low back sprain, initial encounter 10/21/2017  . History of drug abuse ([place]) 10/18/2017  . Gingival erythema 09/23/2017  . Chronic pain of left knee 11/24/2016  . Tobacco abuse   . Diastolic heart failure ([place]) 02/05/2015  . Healthcare maintenance 05/28/2014  . Hyperlipidemia 03/10/2014  . Patellar tendonitis 11/16/2012  . [place] OCCLUSION 06/03/2010  . PARANOID SCHIZOPHRENIA, CHRONIC 01/17/2009  . HYPERTENSION, BENIGN ESSENTIAL 01/17/2009  . BPH (benign prostatic hyperplasia) 01/17/2009  . HEPATITIS C 12/14/2008  . DEPRESSION 12/14/2008    Past  Surgical History:  Procedure Laterality Date  . CIRCUMCISION  1959  . TONSILLECTOMY         Family History  Problem Relation Age of Onset  . Hypertension Mother   . Diabetes Mother   . Stroke Mother     Social History   Tobacco Use  . Smoking status: Current Every Day Smoker    Packs/day: 0.50    Years: 48.00    Pack years: 24.00    Types: Cigarettes  . Smokeless tobacco: Never Used  Substance Use Topics  . Alcohol use: No  . Drug use: No    Home Medications Prior to Admission medications   Medication Sig Start Date End Date Taking? Authorizing Provider  acetaminophen (TYLENOL) 500 MG tablet Take 1 tablet (500 mg total) by mouth every 6 (six) hours as needed. 04/23/18   [name], [name], PA-C  amLODipine (NORVASC) 10 MG tablet Take 1 tablet (10 mg total) by mouth daily. 04/13/18   [name], DO  aspirin 81 MG tablet Take 1 tablet (81 mg total) by mouth daily. 09/24/14   [name], MD  atorvastatin (LIPITOR) 40 MG tablet Take 1 tablet (40 mg total) by mouth daily. 08/15/19   [name], DO  benztropine (COGENTIN) 1 MG tablet Take 1 tablet (1 mg total) by mouth 2 (two) times daily. 11/05/16   [name], MD  cetirizine (ZYRTEC ALLERGY) 10 MG tablet Take  1 tablet (10 mg total) by mouth daily. 12/09/19   [name], PA-C  famotidine (PEPCID) 20 MG tablet Take 1 tablet (20 mg total) by mouth 2 (two) times daily. 05/02/19   [name], DO  fluPHENAZine decanoate (PROLIXIN) 25 MG/ML injection Inject 25 mg into the muscle every 14 (fourteen) days. Last dose 09/07/12    [provider]  fluticasone (FLONASE) 50 MCG/ACT nasal spray Place 2 sprays into both nostrils daily. 12/11/19   Fatima Blank, MD  losartan (COZAAR) 100 MG tablet Take 1 tablet (100 mg total) by mouth daily. 11/28/19   [name], DO  Multiple Vitamin (MULTIVITAMIN WITH MINERALS) TABS tablet Take 1 tablet by mouth daily. 06/28/14   [name], MD  sodium  chloride (OCEAN) 0.65 % SOLN nasal spray Place 1 spray into both nostrils as needed for congestion. 11/19/19   [name], PA-C  tamsulosin (FLOMAX) 0.4 MG CAPS capsule Take 2 capsules (0.8 mg total) by mouth daily. 11/27/17   [name], MD  tamsulosin (FLOMAX) 0.4 MG CAPS capsule TAKE 2 CAPSULE BY MOUTH DAILY 04/17/19   [name], DO  traMADol (ULTRAM) 50 MG tablet Take 1 tablet (50 mg total) by mouth every 6 (six) hours as needed. 07/04/18   [name], MD  triamcinolone cream (KENALOG) 0.1 % Apply 1 application topically 2 (two) times daily. For 5 days, then twice daily as needed for rash and itching. 01/03/15   [name], MD  vitamin C (ASCORBIC ACID) 500 MG tablet Take 500 mg by mouth daily.    [provider]  loratadine (CLARITIN) 10 MG tablet Take 1 tablet (10 mg total) by mouth daily. 11/09/17 11/19/19  Couture, Cortni S, PA-C    Allergies    Lisinopril, Penicillins, Amoxicillin, and Ibuprofen  Review of Systems   Review of Systems  Constitutional: Negative for fever.  Respiratory: Negative for shortness of breath.   Cardiovascular: Negative for chest pain.  Gastrointestinal: Negative for abdominal pain, nausea and vomiting.  Genitourinary: Negative for dysuria.  Neurological: Negative for headaches.  All other systems reviewed and are negative.   Physical Exam Updated Vital Signs BP (!) 146/90 (BP Location: Left Arm)   Pulse 72   Temp 98.5 F (36.9 C) (Oral)   Resp 16   SpO2 100%   Physical Exam Vitals and nursing note reviewed.  Constitutional:      Appearance: He is well-developed.     Comments: Disheveled but nontoxic, garbled speech at his baseline  HENT:     Head: Normocephalic and atraumatic.  Eyes:     Pupils: Pupils are equal, round, and reactive to light.  Cardiovascular:     Rate and Rhythm: Normal rate and regular rhythm.  Pulmonary:     Effort: Pulmonary effort is normal. No respiratory distress.    Musculoskeletal:     Cervical back: Neck supple.     Right lower leg: No edema.     Left lower leg: No edema.  Skin:    General: Skin is warm and dry.  Neurological:     Mental Status: He is alert and oriented to person, place, and time.     Comments: No focal deficits, appears at his baseline, difficult to understand  Psychiatric:     Comments: Cooperative     ED Results / Procedures / Treatments   Labs (all labs ordered are listed, but only abnormal results are displayed) Labs Reviewed - No data to display  EKG None  Radiology No results found.  Procedures Procedures (including critical care time)  Medications Ordered in ED Medications - No data to display  ED Course  I have reviewed the triage vital signs and the nursing notes.  Pertinent labs & imaging results that were available during my care of the patient were reviewed by me and considered in my medical decision making (see chart for details).    MDM Rules/Calculators/A&P                       Patient presents with initial complaints of congestion.  Denies any physical complaints to me.  Admits that he is just here for a place to sleep.  He frequents the emergency room and is well-known to our emergency department.  He has had 2 prior visits in the last 24 hours.  He appears at his baseline based on my prior evaluations.  Discussed with him that he needs to follow-up at the homeless shelter as we are not a place for him to sleep.  After history, exam, and medical workup I feel the patient has been appropriately medically screened and is safe for discharge home. Pertinent diagnoses were discussed with the patient. Patient was given return precautions.   Final Clinical Impression(s) / ED Diagnoses Final diagnoses:  Homeless  Malingering    Rx / DC Orders ED Discharge Orders    None       Ting Cage, Barbette Hair, MD 12/20/19 [phone]

## 2019-12-20 NOTE — Telephone Encounter (Signed)
Called Mr. Desroches wife [name] today. Left message for Mrs. Sica to give Care Guide a call regarding contact information for her husband Mr. Rawling. Care Guide has tried to call Mr. Burkley several times but cannot seem to reach him.

## 2019-12-20 NOTE — Telephone Encounter (Signed)
Received a call from Lestine Box from the [place] [facility]. Mrs. Tamala Julian stated that currently there are two Men shelters in the [place] area. [facility] and [facility]. Both of which have a waiting list. Tamala Julian stated that before an individual can be sent to a shelter they must first be quarantined and receive a Covid-19 test. The individual will be picked up from their location and taken to a quarantine hotel where they will have to remain until quarantine has been lifted and they can be placed in a shelter. She also stated that when Mr. Max is contacted to give him her contact information and she will be able to go over everything with him in detail. Informed Mrs. Tamala Julian that once Care Guide has made contact with Mr. Vallo he will receive her contact information.

## 2019-12-22 NOTE — Telephone Encounter (Signed)
   SF 12/22/2019   Name: [name]   MRN: [id number]   DOB: [birth year]   AGE: 69 y.o.   GENDER: male   PCP [name], DO.   Called pt regarding Community Resource Referral for assistance with housing. Patient did not answer and could not leave message (voicemail not set up). Also called patient at [phone] (number provided by wife's daughter), but the number was not available . Care Guide has tried several times to contact patient, but has been unsuccessful. Care Guide was able to find two resources for shelters that would benefit [name]: [facility]- patient would have to call [phone] ext [phone] to speak with Intake Specialist to initiate services and  [facility]- patient will need to call [phone] to initiate service. Referral will be closed due to  unable to contact.   Closing referral pending any other needs of patient.    [place], Care Management Phone: [phone] Email: [email or web address]

## 2019-12-22 NOTE — Telephone Encounter (Signed)
Called paitent's wife [name] to see if she has another number to contact Mr. Delgrande. Angela's daughter answered the phone and she provided another number where Mr. Kerschner could be reached. [phone])

## 2019-12-22 NOTE — Telephone Encounter (Signed)
Received a call from the [facility] [place]. The Intake Specialist stated that if an individual would like to be placed in the shelter they will need to call [phone] ext [phone] to complete the application and be placed on the wait list. Once the individual's name comes up they will be given more information about the shelter and have to complete the COVID-19 plan. Where they will work with the [place] [facility] to get tested for COVID-19 and be placed in quarantine.

## 2019-12-28 ENCOUNTER — Emergency Department (HOSPITAL_COMMUNITY)
Admission: EM | Admit: 2019-12-28 | Discharge: 2019-12-28 | Disposition: A | Discharge status: Home / Self Care | Payer: Medicare Other | Attending: Emergency Medicine | Admitting: Emergency Medicine

## 2019-12-28 ENCOUNTER — Other Ambulatory Visit: Payer: Self-pay

## 2019-12-28 ENCOUNTER — Encounter (HOSPITAL_COMMUNITY): Payer: Self-pay

## 2019-12-28 DIAGNOSIS — F200 Paranoid schizophrenia: Secondary | ICD-10-CM | POA: Diagnosis not present

## 2019-12-28 DIAGNOSIS — Z7982 Long term (current) use of aspirin: Secondary | ICD-10-CM | POA: Insufficient documentation

## 2019-12-28 DIAGNOSIS — F17210 Nicotine dependence, cigarettes, uncomplicated: Secondary | ICD-10-CM | POA: Diagnosis not present

## 2019-12-28 DIAGNOSIS — J3489 Other specified disorders of nose and nasal sinuses: Secondary | ICD-10-CM | POA: Insufficient documentation

## 2019-12-28 DIAGNOSIS — Z79899 Other long term (current) drug therapy: Secondary | ICD-10-CM | POA: Diagnosis not present

## 2019-12-28 DIAGNOSIS — I10 Essential (primary) hypertension: Secondary | ICD-10-CM | POA: Insufficient documentation

## 2019-12-28 DIAGNOSIS — R519 Headache, unspecified: Secondary | ICD-10-CM | POA: Diagnosis present

## 2019-12-28 MED ORDER — LORATADINE 10 MG PO TABS: 10.0000 mg | ORAL_TABLET | Freq: Every day | ORAL | 0 refills | Status: DC

## 2019-12-28 MED ORDER — DIPHENHYDRAMINE HCL 25 MG PO CAPS
25.0000 mg | ORAL_CAPSULE | Freq: Once | ORAL | Status: AC
  Administered 2019-12-28: 25 mg via ORAL
  Filled 2019-12-28: qty 1

## 2019-12-28 NOTE — ED Notes (Signed)
Discharge instructions discussed with pt. Pt verbalized understanding. Pt stable and ambulatory. No signature pad available. 

## 2019-12-28 NOTE — ED Provider Notes (Signed)
[facility] EMERGENCY DEPARTMENT Provider Note   CSN: [id number] Arrival date & time: 12/28/19  [id number]     History Chief Complaint  Patient presents with  . Facial Pain    [name] is a 69 y.o. male.  69 yo M with a chief complaint of a runny nose.  This been an ongoing issue for him.  States been going on for quite some time.  He has not take anything consistently for this.  He denies fevers or facial pain.  Denies earaches denies headaches denies sore throat.  Denies sick contacts.  Denies cough.  The history is provided by the patient.  Illness Severity:  Moderate Onset quality:  Gradual Duration:  6 months Timing:  Constant Progression:  Worsening Chronicity:  New Associated symptoms: rhinorrhea   Associated symptoms: no abdominal pain, no chest pain, no congestion, no diarrhea, no fever, no headaches, no myalgias, no rash, no shortness of breath and no vomiting        Past Medical History:  Diagnosis Date  . BPH (benign prostatic hyperplasia)   . Colon polyp 2010  . Depression   . GERD (gastroesophageal reflux disease)   . Hepatitis C    "caught it when I had a blood transfusion"  . High cholesterol   . History of blood transfusion    "when I was young"  . Hypertension   . Paranoid schizophrenia ([place])   . Prostate atrophy   . Retinal vein occlusion     Patient Active Problem List   Diagnosis Date Noted  . GERD (gastroesophageal reflux disease) 05/03/2019  . Hypotension 08/02/2018  . Low back sprain, initial encounter 10/21/2017  . History of drug abuse ([place]) 10/18/2017  . Gingival erythema 09/23/2017  . Chronic pain of left knee 11/24/2016  . Tobacco abuse   . Diastolic heart failure ([place]) 02/05/2015  . Healthcare maintenance 05/28/2014  . Hyperlipidemia 03/10/2014  . Patellar tendonitis 11/16/2012  . [place] OCCLUSION 06/03/2010  . PARANOID SCHIZOPHRENIA, CHRONIC 01/17/2009  . HYPERTENSION, BENIGN ESSENTIAL  01/17/2009  . BPH (benign prostatic hyperplasia) 01/17/2009  . HEPATITIS C 12/14/2008  . DEPRESSION 12/14/2008    Past Surgical History:  Procedure Laterality Date  . CIRCUMCISION  1959  . TONSILLECTOMY         Family History  Problem Relation Age of Onset  . Hypertension Mother   . Diabetes Mother   . Stroke Mother     Social History   Tobacco Use  . Smoking status: Current Every Day Smoker    Packs/day: 0.50    Years: 48.00    Pack years: 24.00    Types: Cigarettes  . Smokeless tobacco: Never Used  Substance Use Topics  . Alcohol use: No  . Drug use: No    Home Medications Prior to Admission medications   Medication Sig Start Date End Date Taking? Authorizing Provider  acetaminophen (TYLENOL) 500 MG tablet Take 1 tablet (500 mg total) by mouth every 6 (six) hours as needed. 04/23/18   [name], [name], PA-C  amLODipine (NORVASC) 10 MG tablet Take 1 tablet (10 mg total) by mouth daily. 04/13/18   [name], DO  aspirin 81 MG tablet Take 1 tablet (81 mg total) by mouth daily. 09/24/14   [name], MD  atorvastatin (LIPITOR) 40 MG tablet Take 1 tablet (40 mg total) by mouth daily. 08/15/19   [name], DO  benztropine (COGENTIN) 1 MG tablet Take 1 tablet (1 mg total)  by mouth 2 (two) times daily. 11/05/16   [name], MD  cetirizine (ZYRTEC ALLERGY) 10 MG tablet Take 1 tablet (10 mg total) by mouth daily. 12/09/19   [name], PA-C  famotidine (PEPCID) 20 MG tablet Take 1 tablet (20 mg total) by mouth 2 (two) times daily. 05/02/19   [name], DO  fluPHENAZine decanoate (PROLIXIN) 25 MG/ML injection Inject 25 mg into the muscle every 14 (fourteen) days. Last dose 09/07/12    [provider]  fluticasone (FLONASE) 50 MCG/ACT nasal spray Place 2 sprays into both nostrils daily. 12/11/19   Fatima Blank, MD  loratadine (CLARITIN) 10 MG tablet Take 1 tablet (10 mg total) by mouth daily. 12/28/19   [name], DO  losartan (COZAAR)  100 MG tablet Take 1 tablet (100 mg total) by mouth daily. 11/28/19   [name], DO  Multiple Vitamin (MULTIVITAMIN WITH MINERALS) TABS tablet Take 1 tablet by mouth daily. 06/28/14   [name], MD  sodium chloride (OCEAN) 0.65 % SOLN nasal spray Place 1 spray into both nostrils as needed for congestion. 11/19/19   [name], PA-C  tamsulosin (FLOMAX) 0.4 MG CAPS capsule Take 2 capsules (0.8 mg total) by mouth daily. 11/27/17   [name], MD  tamsulosin (FLOMAX) 0.4 MG CAPS capsule TAKE 2 CAPSULE BY MOUTH DAILY 04/17/19   [name], DO  traMADol (ULTRAM) 50 MG tablet Take 1 tablet (50 mg total) by mouth every 6 (six) hours as needed. 07/04/18   [name], MD  triamcinolone cream (KENALOG) 0.1 % Apply 1 application topically 2 (two) times daily. For 5 days, then twice daily as needed for rash and itching. 01/03/15   [name], MD  vitamin C (ASCORBIC ACID) 500 MG tablet Take 500 mg by mouth daily.    [provider]    Allergies    Lisinopril, Penicillins, Amoxicillin, and Ibuprofen  Review of Systems   Review of Systems  Constitutional: Negative for chills and fever.  HENT: Positive for rhinorrhea. Negative for congestion and facial swelling.   Eyes: Negative for discharge and visual disturbance.  Respiratory: Negative for shortness of breath.   Cardiovascular: Negative for chest pain and palpitations.  Gastrointestinal: Negative for abdominal pain, diarrhea and vomiting.  Musculoskeletal: Negative for arthralgias and myalgias.  Skin: Negative for color change and rash.  Neurological: Negative for tremors, syncope and headaches.  Psychiatric/Behavioral: Negative for confusion and dysphoric mood.    Physical Exam Updated Vital Signs There were no vitals taken for this visit.  Physical Exam Vitals and nursing note reviewed.  Constitutional:      Appearance: He is well-developed.  HENT:     Head: Normocephalic and  atraumatic.     Comments: Swollen turbinates, posterior nasal drip, no noted sinus ttp, tm normal bilaterally.   Eyes:     Pupils: Pupils are equal, round, and reactive to light.  Neck:     Vascular: No JVD.  Cardiovascular:     Rate and Rhythm: Normal rate and regular rhythm.     Heart sounds: No murmur. No friction rub. No gallop.   Pulmonary:     Effort: No respiratory distress.     Breath sounds: No wheezing.  Abdominal:     General: There is no distension.     Tenderness: There is no guarding or rebound.  Musculoskeletal:        General: Normal range of motion.     Cervical back: Normal range of motion  and neck supple.  Skin:    Coloration: Skin is not pale.     Findings: No rash.  Neurological:     Mental Status: He is alert and oriented to person, place, and time.  Psychiatric:        Behavior: Behavior normal.     ED Results / Procedures / Treatments   Labs (all labs ordered are listed, but only abnormal results are displayed) Labs Reviewed - No data to display  EKG None  Radiology No results found.  Procedures Procedures (including critical care time)  Medications Ordered in ED Medications  diphenhydrAMINE (BENADRYL) capsule 25 mg (has no administration in time range)    ED Course  I have reviewed the triage vital signs and the nursing notes.  Pertinent labs & imaging results that were available during my care of the patient were reviewed by me and considered in my medical decision making (see chart for details).    MDM Rules/Calculators/A&P                      69 yo M with a chief complaint of a runny nose.  Is been going on for months.  This is his 33rd visit in the past 6 months to our facility.  He is well-appearing and nontoxic.  Has no sinus tenderness.  No bacterial source seen.  We will have him start antihistamine.  PCP follow-up.  3:41 AM:  I have discussed the diagnosis/risks/treatment options with the patient and believe the pt to be  eligible for discharge home to follow-up with PCP. We also discussed returning to the ED immediately if new or worsening sx occur. We discussed the sx which are most concerning (e.g., sudden worsening pain, fever, inability to tolerate by mouth) that necessitate immediate return. Medications administered to the patient during their visit and any new prescriptions provided to the patient are listed below.  Medications given during this visit Medications  diphenhydrAMINE (BENADRYL) capsule 25 mg (has no administration in time range)     The patient appears reasonably screen and/or stabilized for discharge and I doubt any other medical condition or other [facility] requiring further screening, evaluation, or treatment in the ED at this time prior to discharge.   Final Clinical Impression(s) / ED Diagnoses Final diagnoses:  Rhinorrhea    Rx / DC Orders ED Discharge Orders         Ordered    loratadine (CLARITIN) 10 MG tablet  Daily     12/28/19 [place], [place], DO 12/28/19 [phone]

## 2019-12-28 NOTE — ED Triage Notes (Signed)
Pt reports nasal congestion today

## 2019-12-28 NOTE — Discharge Instructions (Signed)
You have recurrent nasal congestion.  Typically you would get started on a second generation antihistamine something that would be nondrowsy like Claritin Zyrtec Allegra Xyzal.  He can buy these over-the-counter or I wrote you a prescription for Claritin.  You could also take Benadryl, that works more rapidly but can make you sleepy.  Please follow-up with your family doctor.  Return for fever or facial pain.

## 2019-12-29 ENCOUNTER — Encounter (HOSPITAL_COMMUNITY): Payer: Self-pay | Admitting: Emergency Medicine

## 2019-12-29 ENCOUNTER — Other Ambulatory Visit: Payer: Self-pay

## 2019-12-29 ENCOUNTER — Encounter (HOSPITAL_COMMUNITY): Payer: Self-pay

## 2019-12-29 ENCOUNTER — Emergency Department (HOSPITAL_COMMUNITY)
Admission: EM | Admit: 2019-12-29 | Discharge: 2019-12-29 | Disposition: A | Discharge status: Home / Self Care | Payer: Medicare Other | Source: Home / Self Care | Attending: Emergency Medicine | Admitting: Emergency Medicine

## 2019-12-29 ENCOUNTER — Emergency Department (HOSPITAL_COMMUNITY)
Admission: EM | Admit: 2019-12-29 | Discharge: 2019-12-29 | Disposition: A | Discharge status: Home / Self Care | Payer: Medicare Other | Attending: Emergency Medicine | Admitting: Emergency Medicine

## 2019-12-29 DIAGNOSIS — F17210 Nicotine dependence, cigarettes, uncomplicated: Secondary | ICD-10-CM | POA: Insufficient documentation

## 2019-12-29 DIAGNOSIS — M25562 Pain in left knee: Secondary | ICD-10-CM | POA: Diagnosis present

## 2019-12-29 DIAGNOSIS — G8929 Other chronic pain: Secondary | ICD-10-CM | POA: Diagnosis not present

## 2019-12-29 DIAGNOSIS — Z7982 Long term (current) use of aspirin: Secondary | ICD-10-CM | POA: Insufficient documentation

## 2019-12-29 DIAGNOSIS — R0981 Nasal congestion: Secondary | ICD-10-CM

## 2019-12-29 DIAGNOSIS — Z79899 Other long term (current) drug therapy: Secondary | ICD-10-CM | POA: Diagnosis not present

## 2019-12-29 DIAGNOSIS — I10 Essential (primary) hypertension: Secondary | ICD-10-CM | POA: Diagnosis not present

## 2019-12-29 MED ORDER — MOMETASONE FUROATE 50 MCG/ACT NA SUSP: 2.0000 | Freq: Every day | NASAL | 0 refills | Status: DC

## 2019-12-29 NOTE — ED Notes (Signed)
Patient verbalizes understanding of discharge instructions. Opportunity for questioning and answers were provided. Armband removed by staff, pt discharged from ED ambulatory.   

## 2019-12-29 NOTE — ED Provider Notes (Signed)
[facility] EMERGENCY DEPARTMENT Provider Note   CSN: [id number] Arrival date & time: 12/29/19  2303     History Chief Complaint  Patient presents with  . Facial Pain    [name] is a 69 y.o. male.  The history is provided by the patient and medical records.    69 y.o. M with hx of BPH, depression, Hep C, HLP, HTN, schizophrenia, presenting to the ED for nasal congestion.  Patient well known to myself and this facility, 35 visits in the past 6 months.  I just saw patient last night as well.  He reports nasal congestion.  He thinks it is due to his allergies.  Reports using nasal spray without much change.  He is not taking allergy medication.  No chest pain or SOB.  Past Medical History:  Diagnosis Date  . BPH (benign prostatic hyperplasia)   . Colon polyp 2010  . Depression   . GERD (gastroesophageal reflux disease)   . Hepatitis C    "caught it when I had a blood transfusion"  . High cholesterol   . History of blood transfusion    "when I was young"  . Hypertension   . Paranoid schizophrenia ([place])   . Prostate atrophy   . Retinal vein occlusion     Patient Active Problem List   Diagnosis Date Noted  . GERD (gastroesophageal reflux disease) 05/03/2019  . Hypotension 08/02/2018  . Low back sprain, initial encounter 10/21/2017  . History of drug abuse ([place]) 10/18/2017  . Gingival erythema 09/23/2017  . Chronic pain of left knee 11/24/2016  . Tobacco abuse   . Diastolic heart failure ([place]) 02/05/2015  . Healthcare maintenance 05/28/2014  . Hyperlipidemia 03/10/2014  . Patellar tendonitis 11/16/2012  . [place] OCCLUSION 06/03/2010  . PARANOID SCHIZOPHRENIA, CHRONIC 01/17/2009  . HYPERTENSION, BENIGN ESSENTIAL 01/17/2009  . BPH (benign prostatic hyperplasia) 01/17/2009  . HEPATITIS C 12/14/2008  . DEPRESSION 12/14/2008    Past Surgical History:  Procedure Laterality Date  . CIRCUMCISION  1959  . TONSILLECTOMY          Family History  Problem Relation Age of Onset  . Hypertension Mother   . Diabetes Mother   . Stroke Mother     Social History   Tobacco Use  . Smoking status: Current Every Day Smoker    Packs/day: 0.50    Years: 48.00    Pack years: 24.00    Types: Cigarettes  . Smokeless tobacco: Never Used  Substance Use Topics  . Alcohol use: No  . Drug use: No    Home Medications Prior to Admission medications   Medication Sig Start Date End Date Taking? Authorizing Provider  acetaminophen (TYLENOL) 500 MG tablet Take 1 tablet (500 mg total) by mouth every 6 (six) hours as needed. 04/23/18   [name], [name], PA-C  amLODipine (NORVASC) 10 MG tablet Take 1 tablet (10 mg total) by mouth daily. 04/13/18   [name], DO  aspirin 81 MG tablet Take 1 tablet (81 mg total) by mouth daily. 09/24/14   [name], MD  atorvastatin (LIPITOR) 40 MG tablet Take 1 tablet (40 mg total) by mouth daily. 08/15/19   [name], DO  benztropine (COGENTIN) 1 MG tablet Take 1 tablet (1 mg total) by mouth 2 (two) times daily. 11/05/16   [name], MD  cetirizine (ZYRTEC ALLERGY) 10 MG tablet Take 1 tablet (10 mg total) by mouth daily. 12/09/19   [name],  PA-C  famotidine (PEPCID) 20 MG tablet Take 1 tablet (20 mg total) by mouth 2 (two) times daily. 05/02/19   [name], DO  fluPHENAZine decanoate (PROLIXIN) 25 MG/ML injection Inject 25 mg into the muscle every 14 (fourteen) days. Last dose 09/07/12    [provider]  fluticasone (FLONASE) 50 MCG/ACT nasal spray Place 2 sprays into both nostrils daily. 12/11/19   Fatima Blank, MD  loratadine (CLARITIN) 10 MG tablet Take 1 tablet (10 mg total) by mouth daily. 12/28/19   [name], DO  losartan (COZAAR) 100 MG tablet Take 1 tablet (100 mg total) by mouth daily. 11/28/19   [name], DO  Multiple Vitamin (MULTIVITAMIN WITH MINERALS) TABS tablet Take 1 tablet by mouth daily. 06/28/14   [name], MD   sodium chloride (OCEAN) 0.65 % SOLN nasal spray Place 1 spray into both nostrils as needed for congestion. 11/19/19   [name], PA-C  tamsulosin (FLOMAX) 0.4 MG CAPS capsule Take 2 capsules (0.8 mg total) by mouth daily. 11/27/17   [name], MD  tamsulosin (FLOMAX) 0.4 MG CAPS capsule TAKE 2 CAPSULE BY MOUTH DAILY 04/17/19   [name], DO  traMADol (ULTRAM) 50 MG tablet Take 1 tablet (50 mg total) by mouth every 6 (six) hours as needed. 07/04/18   [name], MD  triamcinolone cream (KENALOG) 0.1 % Apply 1 application topically 2 (two) times daily. For 5 days, then twice daily as needed for rash and itching. 01/03/15   [name], MD  vitamin C (ASCORBIC ACID) 500 MG tablet Take 500 mg by mouth daily.    [provider]    Allergies    Lisinopril, Penicillins, Amoxicillin, and Ibuprofen  Review of Systems   Review of Systems  HENT: Positive for congestion.   All other systems reviewed and are negative.   Physical Exam Updated Vital Signs BP (!) 169/106   Pulse 86   Temp 98.5 F (36.9 C)   Resp 18   SpO2 100%   Physical Exam Vitals and nursing note reviewed.  Constitutional:      Appearance: He is well-developed.     Comments: Sleeping, awoken for exam  HENT:     Head: Normocephalic and atraumatic.     Nose: Nose normal.     Comments: No sinus tenderness Eyes:     Conjunctiva/sclera: Conjunctivae normal.     Pupils: Pupils are equal, round, and reactive to light.  Cardiovascular:     Rate and Rhythm: Normal rate and regular rhythm.     Heart sounds: Normal heart sounds.  Pulmonary:     Effort: Pulmonary effort is normal.     Breath sounds: Normal breath sounds. No wheezing, rhonchi or rales.  Abdominal:     General: Bowel sounds are normal.     Palpations: Abdomen is soft.  Musculoskeletal:        General: Normal range of motion.     Cervical back: Normal range of motion.  Skin:    General: Skin is warm and dry.   Neurological:     Mental Status: He is alert and oriented to person, place, and time.     ED Results / Procedures / Treatments   Labs (all labs ordered are listed, but only abnormal results are displayed) Labs Reviewed - No data to display  EKG None  Radiology No results found.  Procedures Procedures (including critical care time)  Medications Ordered in ED Medications - No data to display  ED Course  I have reviewed the triage vital signs and the nursing notes.  Pertinent labs & imaging results that were available during my care of the patient were reviewed by me and considered in my medical decision making (see chart for details).    MDM Rules/Calculators/A&P  69 year old male presenting to the ED with nasal congestion, thinks it is due to allergies.  Patient is well-known to this facility with multiple recent visits for same.  35 visits in the past 6 months.  He is sleeping on initial presentation, had to be awoken for exam.  Exam is grossly benign.  Denies any chest pain or shortness of breath.  Given prescription for Nasonex, encouraged to continue over-the-counter allergy medicine.  Return here for any new/acute changes.  Final Clinical Impression(s) / ED Diagnoses Final diagnoses:  Nasal congestion    Rx / DC Orders ED Discharge Orders         Ordered    mometasone (NASONEX) 50 MCG/ACT nasal spray  Daily     12/29/19 2354           [name], PA-C 12/30/19 0117    Maudie Flakes, MD 12/30/19 [phone]

## 2019-12-29 NOTE — Discharge Instructions (Signed)
Can use nasal spray for congestion. Recommend to continue allergy medication. Return here for any new/acute changes.

## 2019-12-29 NOTE — ED Provider Notes (Signed)
[facility] EMERGENCY DEPARTMENT Provider Note   CSN: [id number] Arrival date & time: 12/29/19  0013     History Chief Complaint  Patient presents with  . Knee Pain    [name] is a 69 y.o. male.  The history is provided by medical records and the patient.  Knee Pain   69 year old male with history of BPH, depression, GERD, hepatitis, hyperlipidemia, schizophrenia, presenting to the ED with left knee pain.  This is a chronic issue for patient, he has been seen here several times for same.  He denies any injury, trauma, or falls.  Patient is falling asleep multiple times during exam.  Past Medical History:  Diagnosis Date  . BPH (benign prostatic hyperplasia)   . Colon polyp 2010  . Depression   . GERD (gastroesophageal reflux disease)   . Hepatitis C    "caught it when I had a blood transfusion"  . High cholesterol   . History of blood transfusion    "when I was young"  . Hypertension   . Paranoid schizophrenia ([place])   . Prostate atrophy   . Retinal vein occlusion     Patient Active Problem List   Diagnosis Date Noted  . GERD (gastroesophageal reflux disease) 05/03/2019  . Hypotension 08/02/2018  . Low back sprain, initial encounter 10/21/2017  . History of drug abuse ([place]) 10/18/2017  . Gingival erythema 09/23/2017  . Chronic pain of left knee 11/24/2016  . Tobacco abuse   . Diastolic heart failure ([place]) 02/05/2015  . Healthcare maintenance 05/28/2014  . Hyperlipidemia 03/10/2014  . Patellar tendonitis 11/16/2012  . [place] OCCLUSION 06/03/2010  . PARANOID SCHIZOPHRENIA, CHRONIC 01/17/2009  . HYPERTENSION, BENIGN ESSENTIAL 01/17/2009  . BPH (benign prostatic hyperplasia) 01/17/2009  . HEPATITIS C 12/14/2008  . DEPRESSION 12/14/2008    Past Surgical History:  Procedure Laterality Date  . CIRCUMCISION  1959  . TONSILLECTOMY         Family History  Problem Relation Age of Onset  . Hypertension Mother   . Diabetes  Mother   . Stroke Mother     Social History   Tobacco Use  . Smoking status: Current Every Day Smoker    Packs/day: 0.50    Years: 48.00    Pack years: 24.00    Types: Cigarettes  . Smokeless tobacco: Never Used  Substance Use Topics  . Alcohol use: No  . Drug use: No    Home Medications Prior to Admission medications   Medication Sig Start Date End Date Taking? Authorizing Provider  acetaminophen (TYLENOL) 500 MG tablet Take 1 tablet (500 mg total) by mouth every 6 (six) hours as needed. 04/23/18   [name], [name], PA-C  amLODipine (NORVASC) 10 MG tablet Take 1 tablet (10 mg total) by mouth daily. 04/13/18   [name], DO  aspirin 81 MG tablet Take 1 tablet (81 mg total) by mouth daily. 09/24/14   [name], MD  atorvastatin (LIPITOR) 40 MG tablet Take 1 tablet (40 mg total) by mouth daily. 08/15/19   [name], DO  benztropine (COGENTIN) 1 MG tablet Take 1 tablet (1 mg total) by mouth 2 (two) times daily. 11/05/16   [name], MD  cetirizine (ZYRTEC ALLERGY) 10 MG tablet Take 1 tablet (10 mg total) by mouth daily. 12/09/19   [name], PA-C  famotidine (PEPCID) 20 MG tablet Take 1 tablet (20 mg total) by mouth 2 (two) times daily. 05/02/19   [name], DO  fluPHENAZine decanoate (PROLIXIN) 25 MG/ML injection Inject 25 mg into the muscle every 14 (fourteen) days. Last dose 09/07/12    [provider]  fluticasone (FLONASE) 50 MCG/ACT nasal spray Place 2 sprays into both nostrils daily. 12/11/19   Fatima Blank, MD  loratadine (CLARITIN) 10 MG tablet Take 1 tablet (10 mg total) by mouth daily. 12/28/19   [name], DO  losartan (COZAAR) 100 MG tablet Take 1 tablet (100 mg total) by mouth daily. 11/28/19   [name], DO  Multiple Vitamin (MULTIVITAMIN WITH MINERALS) TABS tablet Take 1 tablet by mouth daily. 06/28/14   [name], MD  sodium chloride (OCEAN) 0.65 % SOLN nasal spray Place 1 spray into both nostrils as needed  for congestion. 11/19/19   [name], PA-C  tamsulosin (FLOMAX) 0.4 MG CAPS capsule Take 2 capsules (0.8 mg total) by mouth daily. 11/27/17   [name], MD  tamsulosin (FLOMAX) 0.4 MG CAPS capsule TAKE 2 CAPSULE BY MOUTH DAILY 04/17/19   [name], DO  traMADol (ULTRAM) 50 MG tablet Take 1 tablet (50 mg total) by mouth every 6 (six) hours as needed. 07/04/18   [name], MD  triamcinolone cream (KENALOG) 0.1 % Apply 1 application topically 2 (two) times daily. For 5 days, then twice daily as needed for rash and itching. 01/03/15   [name], MD  vitamin C (ASCORBIC ACID) 500 MG tablet Take 500 mg by mouth daily.    [provider]    Allergies    Lisinopril, Penicillins, Amoxicillin, and Ibuprofen  Review of Systems   Review of Systems  Musculoskeletal: Positive for arthralgias.  All other systems reviewed and are negative.   Physical Exam Updated Vital Signs BP (!) 156/92   Pulse 72   Temp 98.5 F (36.9 C) (Oral)   Resp 15   Ht 5\' 6"  (1.676 m)   Wt 70 kg   SpO2 100%   BMI 24.91 kg/m   Physical Exam Vitals and nursing note reviewed.  Constitutional:      Appearance: He is well-developed.  HENT:     Head: Normocephalic and atraumatic.  Eyes:     Conjunctiva/sclera: Conjunctivae normal.     Pupils: Pupils are equal, round, and reactive to light.  Cardiovascular:     Rate and Rhythm: Normal rate and regular rhythm.     Heart sounds: Normal heart sounds.  Pulmonary:     Effort: Pulmonary effort is normal.     Breath sounds: Normal breath sounds.  Abdominal:     General: Bowel sounds are normal.     Palpations: Abdomen is soft.  Musculoskeletal:        General: Normal range of motion.     Cervical back: Normal range of motion.     Comments: Left knee is atraumatic in appearance without any visible swelling or bony deformity, he is able to flex and extend when prompted, ambulatory here in ED  Skin:    General: Skin is warm and  dry.  Neurological:     Mental Status: He is alert and oriented to person, place, and time.     ED Results / Procedures / Treatments   Labs (all labs ordered are listed, but only abnormal results are displayed) Labs Reviewed - No data to display  EKG None  Radiology No results found.  Procedures Procedures (including critical care time)  Medications Ordered in ED Medications - No data to display  ED Course  I have reviewed  the triage vital signs and the nursing notes.  Pertinent labs & imaging results that were available during my care of the patient were reviewed by me and considered in my medical decision making (see chart for details).    MDM Rules/Calculators/A&P  69 year old male here with chronic left knee pain.  Multiple prior evaluations for same.  Does not appear to be any change from baseline today.  Left knee without any acute signs of trauma and he remains ambulatory here in the ED.  I do not feel he needs any emergent work-up or imaging at this time.  Patient is stable for discharge.  Final Clinical Impression(s) / ED Diagnoses Final diagnoses:  Chronic pain of left knee    Rx / DC Orders ED Discharge Orders    None       [name], PA-C 12/29/19 0252    Orpah Greek, MD 12/29/19 [phone]

## 2019-12-29 NOTE — ED Triage Notes (Signed)
Pt arrives to ED w/ c/o sinus congestion.

## 2019-12-29 NOTE — ED Triage Notes (Signed)
Patient reports left knee pain onset today , denies injury/ambulatory .

## 2020-01-02 ENCOUNTER — Emergency Department (HOSPITAL_COMMUNITY)
Admission: EM | Admit: 2020-01-02 | Discharge: 2020-01-03 | Disposition: A | Discharge status: Home / Self Care | Payer: Medicare Other | Attending: Emergency Medicine | Admitting: Emergency Medicine

## 2020-01-02 ENCOUNTER — Other Ambulatory Visit: Payer: Self-pay

## 2020-01-02 ENCOUNTER — Encounter (HOSPITAL_COMMUNITY): Payer: Self-pay

## 2020-01-02 DIAGNOSIS — B182 Chronic viral hepatitis C: Secondary | ICD-10-CM | POA: Diagnosis not present

## 2020-01-02 DIAGNOSIS — Z79899 Other long term (current) drug therapy: Secondary | ICD-10-CM | POA: Insufficient documentation

## 2020-01-02 DIAGNOSIS — F17210 Nicotine dependence, cigarettes, uncomplicated: Secondary | ICD-10-CM | POA: Diagnosis not present

## 2020-01-02 DIAGNOSIS — F200 Paranoid schizophrenia: Secondary | ICD-10-CM | POA: Diagnosis not present

## 2020-01-02 DIAGNOSIS — R0981 Nasal congestion: Secondary | ICD-10-CM | POA: Insufficient documentation

## 2020-01-02 DIAGNOSIS — R519 Headache, unspecified: Secondary | ICD-10-CM | POA: Diagnosis present

## 2020-01-02 DIAGNOSIS — I10 Essential (primary) hypertension: Secondary | ICD-10-CM | POA: Diagnosis not present

## 2020-01-02 NOTE — ED Triage Notes (Signed)
Pt arrives to ED w/ c/o sinus congestion.

## 2020-01-03 ENCOUNTER — Encounter (HOSPITAL_COMMUNITY): Payer: Self-pay | Admitting: Emergency Medicine

## 2020-01-03 ENCOUNTER — Other Ambulatory Visit: Payer: Self-pay

## 2020-01-03 ENCOUNTER — Emergency Department (HOSPITAL_COMMUNITY)
Admission: EM | Admit: 2020-01-03 | Discharge: 2020-01-04 | Disposition: A | Discharge status: Home / Self Care | Payer: Medicare Other | Source: Home / Self Care | Attending: Emergency Medicine | Admitting: Emergency Medicine

## 2020-01-03 DIAGNOSIS — M25562 Pain in left knee: Secondary | ICD-10-CM

## 2020-01-03 NOTE — Discharge Instructions (Addendum)
Thank you for allowing me to care for you today in the Emergency Department.   Saline nose spray is available over-the-counter and can be used as directed on the label.  Return to the emergency department if you develop new or worsening symptoms including high fevers, visual changes, uncontrollable vomiting, trouble breathing.

## 2020-01-03 NOTE — ED Notes (Signed)
Patient verbalizes understanding of discharge instructions. Opportunity for questioning and answers were provided. Armband removed by staff, pt discharged from ED ambulatory.   

## 2020-01-03 NOTE — ED Triage Notes (Signed)
Pt presents to ED POV. Pt c/o knee swelling.

## 2020-01-03 NOTE — ED Provider Notes (Signed)
[facility] EMERGENCY DEPARTMENT Provider Note   CSN: [id number] Arrival date & time: 01/02/20  2234     History Chief Complaint  Patient presents with  . Facial Pain    [name] is a 69 y.o. male with a history of schizophrenia, homelessness, GERD who presents the emergency department with a chief complaint of nasal congestion and facial pain.  Reports that he has been using nasal spray without significant improvement.  He took an allergy medication earlier today.  No chest pain, shortness of breath, fever, chills, sore throat, facial swelling or redness, visual changes.  Patient is well-known to this facility with 36 visits in the last 6 months.  The history is provided by the patient. No language interpreter was used.       Past Medical History:  Diagnosis Date  . BPH (benign prostatic hyperplasia)   . Colon polyp 2010  . Depression   . GERD (gastroesophageal reflux disease)   . Hepatitis C    "caught it when I had a blood transfusion"  . High cholesterol   . History of blood transfusion    "when I was young"  . Hypertension   . Paranoid schizophrenia ([place])   . Prostate atrophy   . Retinal vein occlusion     Patient Active Problem List   Diagnosis Date Noted  . GERD (gastroesophageal reflux disease) 05/03/2019  . Hypotension 08/02/2018  . Low back sprain, initial encounter 10/21/2017  . History of drug abuse ([place]) 10/18/2017  . Gingival erythema 09/23/2017  . Chronic pain of left knee 11/24/2016  . Tobacco abuse   . Diastolic heart failure ([place]) 02/05/2015  . Healthcare maintenance 05/28/2014  . Hyperlipidemia 03/10/2014  . Patellar tendonitis 11/16/2012  . [place] OCCLUSION 06/03/2010  . PARANOID SCHIZOPHRENIA, CHRONIC 01/17/2009  . HYPERTENSION, BENIGN ESSENTIAL 01/17/2009  . BPH (benign prostatic hyperplasia) 01/17/2009  . HEPATITIS C 12/14/2008  . DEPRESSION 12/14/2008    Past Surgical History:  Procedure Laterality  Date  . CIRCUMCISION  1959  . TONSILLECTOMY         Family History  Problem Relation Age of Onset  . Hypertension Mother   . Diabetes Mother   . Stroke Mother     Social History   Tobacco Use  . Smoking status: Current Every Day Smoker    Packs/day: 0.50    Years: 48.00    Pack years: 24.00    Types: Cigarettes  . Smokeless tobacco: Never Used  Substance Use Topics  . Alcohol use: No  . Drug use: No    Home Medications Prior to Admission medications   Medication Sig Start Date End Date Taking? Authorizing Provider  acetaminophen (TYLENOL) 500 MG tablet Take 1 tablet (500 mg total) by mouth every 6 (six) hours as needed. 04/23/18   [name], [name], PA-C  amLODipine (NORVASC) 10 MG tablet Take 1 tablet (10 mg total) by mouth daily. 04/13/18   [name], DO  aspirin 81 MG tablet Take 1 tablet (81 mg total) by mouth daily. 09/24/14   [name], MD  atorvastatin (LIPITOR) 40 MG tablet Take 1 tablet (40 mg total) by mouth daily. 08/15/19   [name], DO  benztropine (COGENTIN) 1 MG tablet Take 1 tablet (1 mg total) by mouth 2 (two) times daily. 11/05/16   [name], MD  cetirizine (ZYRTEC ALLERGY) 10 MG tablet Take 1 tablet (10 mg total) by mouth daily. 12/09/19   [name], PA-C  famotidine (PEPCID) 20 MG tablet Take 1 tablet (20 mg total) by mouth 2 (two) times daily. 05/02/19   [name], DO  fluPHENAZine decanoate (PROLIXIN) 25 MG/ML injection Inject 25 mg into the muscle every 14 (fourteen) days. Last dose 09/07/12    [provider]  fluticasone (FLONASE) 50 MCG/ACT nasal spray Place 2 sprays into both nostrils daily. 12/11/19   Fatima Blank, MD  loratadine (CLARITIN) 10 MG tablet Take 1 tablet (10 mg total) by mouth daily. 12/28/19   [name], DO  losartan (COZAAR) 100 MG tablet Take 1 tablet (100 mg total) by mouth daily. 11/28/19   Tammi Klippel, Sherin, DO  mometasone (NASONEX) 50 MCG/ACT nasal spray Place 2 sprays into the nose  daily. 12/29/19   [name], PA-C  Multiple Vitamin (MULTIVITAMIN WITH MINERALS) TABS tablet Take 1 tablet by mouth daily. 06/28/14   [name], MD  sodium chloride (OCEAN) 0.65 % SOLN nasal spray Place 1 spray into both nostrils as needed for congestion. 11/19/19   [name], PA-C  tamsulosin (FLOMAX) 0.4 MG CAPS capsule Take 2 capsules (0.8 mg total) by mouth daily. 11/27/17   [name], MD  tamsulosin (FLOMAX) 0.4 MG CAPS capsule TAKE 2 CAPSULE BY MOUTH DAILY 04/17/19   [name], DO  traMADol (ULTRAM) 50 MG tablet Take 1 tablet (50 mg total) by mouth every 6 (six) hours as needed. 07/04/18   [name], MD  triamcinolone cream (KENALOG) 0.1 % Apply 1 application topically 2 (two) times daily. For 5 days, then twice daily as needed for rash and itching. 01/03/15   [name], MD  vitamin C (ASCORBIC ACID) 500 MG tablet Take 500 mg by mouth daily.    [provider]    Allergies    Lisinopril, Penicillins, Amoxicillin, and Ibuprofen  Review of Systems   Review of Systems  Constitutional: Negative for appetite change, chills and fever.  HENT: Positive for congestion. Negative for ear discharge, ear pain, sinus pressure, sinus pain and sore throat.        Facial pain  Eyes: Negative for visual disturbance.  Respiratory: Negative for shortness of breath.   Cardiovascular: Negative for chest pain, palpitations and leg swelling.  Gastrointestinal: Negative for abdominal pain, diarrhea, nausea and vomiting.  Genitourinary: Negative for dysuria.  Musculoskeletal: Negative for back pain.  Skin: Negative for rash.  Allergic/Immunologic: Negative for immunocompromised state.  Neurological: Negative for dizziness, light-headedness, numbness and headaches.  Psychiatric/Behavioral: Negative for confusion.    Physical Exam Updated Vital Signs BP (!) 154/95   Pulse 83   Temp 98.2 F (36.8 C) (Oral)   Resp 18   SpO2 100%   Physical  Exam Vitals and nursing note reviewed.  Constitutional:      Appearance: He is well-developed.     Comments: Sleeping and resting comfortably when I entered the room.  He rouses easily to voice.  HENT:     Head: Normocephalic.     Right Ear: Tympanic membrane, ear canal and external ear normal.     Left Ear: Tympanic membrane, ear canal and external ear normal.     Nose: No congestion or rhinorrhea.     Comments: No tenderness to the bilateral maxillary or frontal sinuses.    Mouth/Throat:     Mouth: Mucous membranes are moist.     Pharynx: No oropharyngeal exudate or posterior oropharyngeal erythema.  Eyes:     Conjunctiva/sclera: Conjunctivae normal.  Cardiovascular:     Rate  and Rhythm: Normal rate and regular rhythm.     Heart sounds: No murmur.  Pulmonary:     Effort: Pulmonary effort is normal. No respiratory distress.     Breath sounds: No stridor. No wheezing, rhonchi or rales.  Chest:     Chest wall: No tenderness.  Abdominal:     General: There is no distension.     Palpations: Abdomen is soft.  Musculoskeletal:     Cervical back: Neck supple.  Skin:    General: Skin is warm and dry.  Neurological:     Mental Status: He is alert.  Psychiatric:        Behavior: Behavior normal.     ED Results / Procedures / Treatments   Labs (all labs ordered are listed, but only abnormal results are displayed) Labs Reviewed - No data to display  EKG None  Radiology No results found.  Procedures Procedures (including critical care time)  Medications Ordered in ED Medications - No data to display  ED Course  I have reviewed the triage vital signs and the nursing notes.  Pertinent labs & imaging results that were available during my care of the patient were reviewed by me and considered in my medical decision making (see chart for details).    MDM Rules/Calculators/A&P                      69 year old male with history of homelessness, schizophrenia presenting  with complaints of facial pain and nasal congestion.  Reports that he has been using a nasal spray and took an allergy pill earlier today.  Patient is well-known to this facility with 36 visits in the last 6 months.  He is seen almost daily.  He was sleeping comfortably and had no acute distress when I entered the room.  He awoke easily.  No tenderness over the bilateral frontal and maxillary sinuses.  No focal findings concerning for further urgent or emergent work-up.  Lungs are clear to auscultation bilaterally.  He ambulated in the room and out to the hallway to the bathroom with a steady gait.  Encouraged saline nasal spray and was given a referral to the nurse practitioner at the [facility].  He is hemodynamically stable to no acute distress.  Return precautions given.  Safe for discharge at this time.  Final Clinical Impression(s) / ED Diagnoses Final diagnoses:  Nasal congestion    Rx / DC Orders ED Discharge Orders    None       Anavi Branscum A, PA-C 01/03/20 [place], Delice Bison, DO 01/03/20 [id number]

## 2020-01-04 ENCOUNTER — Emergency Department (HOSPITAL_COMMUNITY)
Admission: EM | Admit: 2020-01-04 | Discharge: 2020-01-05 | Disposition: A | Discharge status: Home / Self Care | Payer: Medicare Other | Attending: Emergency Medicine | Admitting: Emergency Medicine

## 2020-01-04 ENCOUNTER — Other Ambulatory Visit: Payer: Self-pay

## 2020-01-04 ENCOUNTER — Encounter (HOSPITAL_COMMUNITY): Payer: Self-pay | Admitting: Emergency Medicine

## 2020-01-04 DIAGNOSIS — R0981 Nasal congestion: Secondary | ICD-10-CM

## 2020-01-04 DIAGNOSIS — R519 Headache, unspecified: Secondary | ICD-10-CM | POA: Diagnosis present

## 2020-01-04 MED ORDER — LIDOCAINE 5 % EX PTCH
1.0000 | MEDICATED_PATCH | CUTANEOUS | Status: DC
  Administered 2020-01-04: 1 via TRANSDERMAL
  Filled 2020-01-04: qty 1

## 2020-01-04 NOTE — ED Provider Notes (Signed)
[facility] EMERGENCY DEPARTMENT Provider Note   CSN: [id number] Arrival date & time: 01/03/20  2335     History Chief Complaint  Patient presents with  . Knee Pain    [name] is a 69 y.o. male.  The history is provided by the patient.  Knee Pain Location:  Knee Time since incident:  1 day Injury: no   Knee location:  L knee Pain details:    Quality:  Aching   Radiates to:  Does not radiate   Severity:  Moderate   Onset quality:  Sudden   Duration:  1 day   Timing:  Constant   Progression:  Unchanged Chronicity:  Chronic Dislocation: no   Tetanus status:  Unknown Prior injury to area:  No Relieved by:  Nothing Worsened by:  Nothing Ineffective treatments:  None tried Associated symptoms: no back pain, no decreased ROM, no fatigue, no fever, no itching, no muscle weakness, no neck pain, no numbness and no stiffness   Risk factors: no concern for non-accidental trauma   Patient reports swelling.  Has been seen for same in the past.       Past Medical History:  Diagnosis Date  . BPH (benign prostatic hyperplasia)   . Colon polyp 2010  . Depression   . GERD (gastroesophageal reflux disease)   . Hepatitis C    "caught it when I had a blood transfusion"  . High cholesterol   . History of blood transfusion    "when I was young"  . Hypertension   . Paranoid schizophrenia ([place])   . Prostate atrophy   . Retinal vein occlusion     Patient Active Problem List   Diagnosis Date Noted  . GERD (gastroesophageal reflux disease) 05/03/2019  . Hypotension 08/02/2018  . Low back sprain, initial encounter 10/21/2017  . History of drug abuse ([place]) 10/18/2017  . Gingival erythema 09/23/2017  . Chronic pain of left knee 11/24/2016  . Tobacco abuse   . Diastolic heart failure ([place]) 02/05/2015  . Healthcare maintenance 05/28/2014  . Hyperlipidemia 03/10/2014  . Patellar tendonitis 11/16/2012  . [place] OCCLUSION 06/03/2010  .  PARANOID SCHIZOPHRENIA, CHRONIC 01/17/2009  . HYPERTENSION, BENIGN ESSENTIAL 01/17/2009  . BPH (benign prostatic hyperplasia) 01/17/2009  . HEPATITIS C 12/14/2008  . DEPRESSION 12/14/2008    Past Surgical History:  Procedure Laterality Date  . CIRCUMCISION  1959  . TONSILLECTOMY         Family History  Problem Relation Age of Onset  . Hypertension Mother   . Diabetes Mother   . Stroke Mother     Social History   Tobacco Use  . Smoking status: Current Every Day Smoker    Packs/day: 0.50    Years: 48.00    Pack years: 24.00    Types: Cigarettes  . Smokeless tobacco: Never Used  Substance Use Topics  . Alcohol use: No  . Drug use: No    Home Medications Prior to Admission medications   Medication Sig Start Date End Date Taking? Authorizing Provider  acetaminophen (TYLENOL) 500 MG tablet Take 1 tablet (500 mg total) by mouth every 6 (six) hours as needed. 04/23/18   [name], [name], PA-C  amLODipine (NORVASC) 10 MG tablet Take 1 tablet (10 mg total) by mouth daily. 04/13/18   [name], DO  aspirin 81 MG tablet Take 1 tablet (81 mg total) by mouth daily. 09/24/14   [name], MD  atorvastatin (LIPITOR) 40 MG tablet  Take 1 tablet (40 mg total) by mouth daily. 08/15/19   [name], DO  benztropine (COGENTIN) 1 MG tablet Take 1 tablet (1 mg total) by mouth 2 (two) times daily. 11/05/16   [name], MD  cetirizine (ZYRTEC ALLERGY) 10 MG tablet Take 1 tablet (10 mg total) by mouth daily. 12/09/19   [name], PA-C  famotidine (PEPCID) 20 MG tablet Take 1 tablet (20 mg total) by mouth 2 (two) times daily. 05/02/19   [name], DO  fluPHENAZine decanoate (PROLIXIN) 25 MG/ML injection Inject 25 mg into the muscle every 14 (fourteen) days. Last dose 09/07/12    [provider]  fluticasone (FLONASE) 50 MCG/ACT nasal spray Place 2 sprays into both nostrils daily. 12/11/19   Fatima Blank, MD  loratadine (CLARITIN) 10 MG tablet Take 1  tablet (10 mg total) by mouth daily. 12/28/19   [name], DO  losartan (COZAAR) 100 MG tablet Take 1 tablet (100 mg total) by mouth daily. 11/28/19   Tammi Klippel, Sherin, DO  mometasone (NASONEX) 50 MCG/ACT nasal spray Place 2 sprays into the nose daily. 12/29/19   [name], PA-C  Multiple Vitamin (MULTIVITAMIN WITH MINERALS) TABS tablet Take 1 tablet by mouth daily. 06/28/14   [name], MD  sodium chloride (OCEAN) 0.65 % SOLN nasal spray Place 1 spray into both nostrils as needed for congestion. 11/19/19   [name], PA-C  tamsulosin (FLOMAX) 0.4 MG CAPS capsule Take 2 capsules (0.8 mg total) by mouth daily. 11/27/17   [name], MD  tamsulosin (FLOMAX) 0.4 MG CAPS capsule TAKE 2 CAPSULE BY MOUTH DAILY 04/17/19   [name], DO  traMADol (ULTRAM) 50 MG tablet Take 1 tablet (50 mg total) by mouth every 6 (six) hours as needed. 07/04/18   [name], MD  triamcinolone cream (KENALOG) 0.1 % Apply 1 application topically 2 (two) times daily. For 5 days, then twice daily as needed for rash and itching. 01/03/15   [name], MD  vitamin C (ASCORBIC ACID) 500 MG tablet Take 500 mg by mouth daily.    [provider]    Allergies    Lisinopril, Penicillins, Amoxicillin, and Ibuprofen  Review of Systems   Review of Systems  Constitutional: Negative for fatigue and fever.  HENT: Negative for congestion.   Eyes: Negative for visual disturbance.  Respiratory: Negative for chest tightness.   Cardiovascular: Negative for chest pain.  Gastrointestinal: Negative for abdominal pain.  Genitourinary: Negative for difficulty urinating.  Musculoskeletal: Positive for arthralgias. Negative for back pain, neck pain and stiffness.  Skin: Negative for itching.  Psychiatric/Behavioral: Negative for agitation.  All other systems reviewed and are negative.   Physical Exam Updated Vital Signs BP (!) 161/104 (BP Location: Left Arm)   Pulse 81   Temp (!)  97.5 F (36.4 C) (Oral)   Resp 16   Ht 5\' 6"  (1.676 m)   Wt 70 kg   SpO2 100%   BMI 24.91 kg/m   Physical Exam Vitals and nursing note reviewed.  Constitutional:      General: He is not in acute distress.    Appearance: Normal appearance.  HENT:     Head: Normocephalic and atraumatic.     Nose: Nose normal.  Eyes:     Extraocular Movements: Extraocular movements intact.     Pupils: Pupils are equal, round, and reactive to light.  Cardiovascular:     Rate and Rhythm: Normal rate and regular rhythm.  Pulses: Normal pulses.     Heart sounds: Normal heart sounds.  Pulmonary:     Effort: Pulmonary effort is normal.     Breath sounds: Normal breath sounds.  Abdominal:     General: Abdomen is flat. Bowel sounds are normal.     Tenderness: There is no abdominal tenderness. There is no guarding.  Musculoskeletal:        General: No swelling, tenderness, deformity or signs of injury. Normal range of motion.     Cervical back: Normal range of motion and neck supple.     Right knee: Normal.     Left knee: Normal.     Right lower leg: No edema.     Left lower leg: No edema.  Skin:    General: Skin is warm and dry.     Capillary Refill: Capillary refill takes less than 2 seconds.  Neurological:     General: No focal deficit present.     Mental Status: He is alert and oriented to person, place, and time.     Deep Tendon Reflexes: Reflexes normal.     ED Results / Procedures / Treatments   Labs (all labs ordered are listed, but only abnormal results are displayed) Labs Reviewed - No data to display  EKG None  Radiology No results found.  Procedures Procedures (including critical care time)  Medications Ordered in ED Medications - No data to display  ED Course  I have reviewed the triage vital signs and the nursing notes.  Pertinent labs & imaging results that were available during my care of the patient were reviewed by me and considered in my medical decision  making (see chart for details).    States there is swelling but there is none.  Knees are normal on exam.  FROM.  Lidoderm applied.   Final Clinical Impression(s) / ED Diagnoses Return for intractable cough, coughing up blood,fevers >100.4 unrelieved by medication, shortness of breath, intractable vomiting, chest pain, shortness of breath, weakness,numbness, changes in speech, facial asymmetry,abdominal pain, passing out,Inability to tolerate liquids or food, cough, altered mental status or any concerns. No signs of systemic illness or infection. The patient is nontoxic-appearing on exam and vital signs are within normal limits.   I have reviewed the triage vital signs and the nursing notes. Pertinent labs &imaging results that were available during my care of the patient were reviewed by me and considered in my medical decision making (see chart for details).After history, exam, and medical workup I feel the patient has beenappropriately medically screened and is safe for discharge home. Pertinent diagnoses were discussed with the patient. Patient was given return precautions.   Nakima Fluegge, MD 01/04/20 [id number]

## 2020-01-04 NOTE — ED Notes (Signed)
Discharge instructions discussed with pt. Pt verbalized understanding. Pt stable and ambulatory. No signature pad available. 

## 2020-01-04 NOTE — ED Triage Notes (Signed)
Sinus congestion and runny nose.

## 2020-01-05 DIAGNOSIS — R0981 Nasal congestion: Secondary | ICD-10-CM | POA: Diagnosis not present

## 2020-01-05 MED ORDER — LORATADINE 10 MG PO TABS
10.0000 mg | ORAL_TABLET | Freq: Once | ORAL | Status: AC
  Administered 2020-01-05: 10 mg via ORAL
  Filled 2020-01-05: qty 1

## 2020-01-05 NOTE — ED Provider Notes (Signed)
[facility] EMERGENCY DEPARTMENT Provider Note   CSN: [id number] Arrival date & time: 01/04/20  2336     History Chief Complaint  Patient presents with  . Facial Pain    [name] is a 69 y.o. male.  69 year old male with history of schizophrenia, hypertension, homelessness, hepatitis C, elevated cholesterol, depression who is well-known to emergency department for multiple visits over the last several months.  Patient is presenting today with nasal congestion and sinus pressure.  Reports that this is an ongoing problem.  Has been seen in the emergency department multiple times for the same.        Past Medical History:  Diagnosis Date  . BPH (benign prostatic hyperplasia)   . Colon polyp 2010  . Depression   . GERD (gastroesophageal reflux disease)   . Hepatitis C    "caught it when I had a blood transfusion"  . High cholesterol   . History of blood transfusion    "when I was young"  . Hypertension   . Paranoid schizophrenia ([place])   . Prostate atrophy   . Retinal vein occlusion     Patient Active Problem List   Diagnosis Date Noted  . GERD (gastroesophageal reflux disease) 05/03/2019  . Hypotension 08/02/2018  . Low back sprain, initial encounter 10/21/2017  . History of drug abuse ([place]) 10/18/2017  . Gingival erythema 09/23/2017  . Chronic pain of left knee 11/24/2016  . Tobacco abuse   . Diastolic heart failure ([place]) 02/05/2015  . Healthcare maintenance 05/28/2014  . Hyperlipidemia 03/10/2014  . Patellar tendonitis 11/16/2012  . [place] OCCLUSION 06/03/2010  . PARANOID SCHIZOPHRENIA, CHRONIC 01/17/2009  . HYPERTENSION, BENIGN ESSENTIAL 01/17/2009  . BPH (benign prostatic hyperplasia) 01/17/2009  . HEPATITIS C 12/14/2008  . DEPRESSION 12/14/2008    Past Surgical History:  Procedure Laterality Date  . CIRCUMCISION  1959  . TONSILLECTOMY         Family History  Problem Relation Age of Onset  . Hypertension Mother    . Diabetes Mother   . Stroke Mother     Social History   Tobacco Use  . Smoking status: Current Every Day Smoker    Packs/day: 0.50    Years: 48.00    Pack years: 24.00    Types: Cigarettes  . Smokeless tobacco: Never Used  Substance Use Topics  . Alcohol use: No  . Drug use: No    Home Medications Prior to Admission medications   Medication Sig Start Date End Date Taking? Authorizing Provider  acetaminophen (TYLENOL) 500 MG tablet Take 1 tablet (500 mg total) by mouth every 6 (six) hours as needed. 04/23/18   [name], [name], PA-C  amLODipine (NORVASC) 10 MG tablet Take 1 tablet (10 mg total) by mouth daily. 04/13/18   [name], DO  aspirin 81 MG tablet Take 1 tablet (81 mg total) by mouth daily. 09/24/14   [name], MD  atorvastatin (LIPITOR) 40 MG tablet Take 1 tablet (40 mg total) by mouth daily. 08/15/19   [name], DO  benztropine (COGENTIN) 1 MG tablet Take 1 tablet (1 mg total) by mouth 2 (two) times daily. 11/05/16   [name], MD  cetirizine (ZYRTEC ALLERGY) 10 MG tablet Take 1 tablet (10 mg total) by mouth daily. 12/09/19   [name], PA-C  famotidine (PEPCID) 20 MG tablet Take 1 tablet (20 mg total) by mouth 2 (two) times daily. 05/02/19   [name], DO  fluPHENAZine decanoate (  PROLIXIN) 25 MG/ML injection Inject 25 mg into the muscle every 14 (fourteen) days. Last dose 09/07/12    [provider]  fluticasone (FLONASE) 50 MCG/ACT nasal spray Place 2 sprays into both nostrils daily. 12/11/19   Fatima Blank, MD  loratadine (CLARITIN) 10 MG tablet Take 1 tablet (10 mg total) by mouth daily. 12/28/19   [name], DO  losartan (COZAAR) 100 MG tablet Take 1 tablet (100 mg total) by mouth daily. 11/28/19   Tammi Klippel, Sherin, DO  mometasone (NASONEX) 50 MCG/ACT nasal spray Place 2 sprays into the nose daily. 12/29/19   [name], PA-C  Multiple Vitamin (MULTIVITAMIN WITH MINERALS) TABS tablet Take 1 tablet by mouth daily.  06/28/14   [name], MD  sodium chloride (OCEAN) 0.65 % SOLN nasal spray Place 1 spray into both nostrils as needed for congestion. 11/19/19   [name], PA-C  tamsulosin (FLOMAX) 0.4 MG CAPS capsule Take 2 capsules (0.8 mg total) by mouth daily. 11/27/17   [name], MD  tamsulosin (FLOMAX) 0.4 MG CAPS capsule TAKE 2 CAPSULE BY MOUTH DAILY 04/17/19   [name], DO  traMADol (ULTRAM) 50 MG tablet Take 1 tablet (50 mg total) by mouth every 6 (six) hours as needed. 07/04/18   [name], MD  triamcinolone cream (KENALOG) 0.1 % Apply 1 application topically 2 (two) times daily. For 5 days, then twice daily as needed for rash and itching. 01/03/15   [name], MD  vitamin C (ASCORBIC ACID) 500 MG tablet Take 500 mg by mouth daily.    [provider]    Allergies    Lisinopril, Penicillins, Amoxicillin, and Ibuprofen  Review of Systems   Review of Systems  Constitutional: Negative for chills, fatigue and fever.  HENT: Positive for congestion, rhinorrhea, sinus pressure and sinus pain. Negative for ear discharge, facial swelling, mouth sores, nosebleeds, postnasal drip, sneezing, sore throat and tinnitus.   Respiratory: Negative for cough and shortness of breath.   Cardiovascular: Negative for chest pain.  Gastrointestinal: Negative for abdominal pain, nausea and vomiting.  Genitourinary: Negative for dysuria.  Musculoskeletal: Negative for back pain.  Skin: Negative for rash.  Neurological: Negative for dizziness and headaches.  Psychiatric/Behavioral: Negative for confusion.    Physical Exam Updated Vital Signs BP (!) 166/90 (BP Location: Right Arm)   Pulse 68   Temp 98.6 F (37 C)   Resp 16   SpO2 99%   Physical Exam Vitals and nursing note reviewed.  Constitutional:      General: He is not in acute distress.    Appearance: Normal appearance. He is not ill-appearing, toxic-appearing or diaphoretic.     Comments: Patient is  sleeping comfortably but easily awakened when I enter the room.  HENT:     Head: Normocephalic.     Comments: No frontal or maxillary sinus tenderness to percussion.    Right Ear: Tympanic membrane normal.     Left Ear: Tympanic membrane normal.     Nose: Nose normal. No congestion or rhinorrhea.     Mouth/Throat:     Mouth: Mucous membranes are moist.  Eyes:     Conjunctiva/sclera: Conjunctivae normal.  Pulmonary:     Effort: Pulmonary effort is normal.  Skin:    General: Skin is dry.  Neurological:     Mental Status: He is alert.  Psychiatric:        Mood and Affect: Mood normal.     ED Results / Procedures / Treatments  Labs (all labs ordered are listed, but only abnormal results are displayed) Labs Reviewed - No data to display  EKG None  Radiology No results found.  Procedures Procedures (including critical care time)  Medications Ordered in ED Medications  loratadine (CLARITIN) tablet 10 mg (has no administration in time range)    ED Course  I have reviewed the triage vital signs and the nursing notes.  Pertinent labs & imaging results that were available during my care of the patient were reviewed by me and considered in my medical decision making (see chart for details).  Clinical Course as of Jan 05 27  Fri Jan 05, 2020  0026 Patient well-known to the emergency department for multiple visits for similar problems.  Presenting today with sinus congestion and runny nose.  Has been taking Nasonex at home.  Reports that its not helpful.  When I enter the room he is initially sleeping but is easily awakened.  He has a benign exam.  We will give him a dose of some Claritin here and he can follow-up with primary care doctor.  I do not see an emergent need for further imaging or further work-up at this time but he was advised on return precautions.   [KM]    Clinical Course User Index [KM] Kristine Royal   MDM Rules/Calculators/A&P                        Based on review of vitals, medical screening exam, lab work and/or imaging, there does not appear to be an acute, emergent etiology for the patient's symptoms. Counseled pt on good return precautions and encouraged both PCP and ED follow-up as needed.  Prior to discharge, I also discussed incidental imaging findings with patient in detail and advised appropriate, recommended follow-up in detail.  Clinical Impression: 1. Sinus congestion     Disposition: Discharge  Prior to providing a prescription for a controlled substance, I independently reviewed the patient's recent prescription history on the [place]. The patient had no recent or regular prescriptions and was deemed appropriate for a brief, less than 3 day prescription of narcotic for acute analgesia.  This note was prepared with assistance of Systems analyst. Occasional wrong-word or sound-a-like substitutions may have occurred due to the inherent limitations of voice recognition software.  Final Clinical Impression(s) / ED Diagnoses Final diagnoses:  Sinus congestion    Rx / DC Orders ED Discharge Orders    None       Kristine Royal 01/05/20 Victorino Dike, April, MD 01/05/20 0040

## 2020-01-05 NOTE — Discharge Instructions (Signed)
Thank you for allowing me to care for you today. Please return to the emergency department if you have new or worsening symptoms. Take your medications as instructed.  ° °

## 2020-01-06 ENCOUNTER — Other Ambulatory Visit: Payer: Self-pay

## 2020-01-06 ENCOUNTER — Encounter (HOSPITAL_COMMUNITY): Payer: Self-pay | Admitting: Emergency Medicine

## 2020-01-06 ENCOUNTER — Emergency Department (HOSPITAL_COMMUNITY)
Admission: EM | Admit: 2020-01-06 | Discharge: 2020-01-06 | Disposition: A | Discharge status: Home / Self Care | Payer: Medicare Other | Attending: Emergency Medicine | Admitting: Emergency Medicine

## 2020-01-06 DIAGNOSIS — F17210 Nicotine dependence, cigarettes, uncomplicated: Secondary | ICD-10-CM | POA: Diagnosis not present

## 2020-01-06 DIAGNOSIS — Z7982 Long term (current) use of aspirin: Secondary | ICD-10-CM | POA: Insufficient documentation

## 2020-01-06 DIAGNOSIS — Z79899 Other long term (current) drug therapy: Secondary | ICD-10-CM | POA: Insufficient documentation

## 2020-01-06 DIAGNOSIS — J310 Chronic rhinitis: Secondary | ICD-10-CM | POA: Diagnosis not present

## 2020-01-06 DIAGNOSIS — I110 Hypertensive heart disease with heart failure: Secondary | ICD-10-CM | POA: Diagnosis not present

## 2020-01-06 DIAGNOSIS — I5030 Unspecified diastolic (congestive) heart failure: Secondary | ICD-10-CM | POA: Insufficient documentation

## 2020-01-06 DIAGNOSIS — J3489 Other specified disorders of nose and nasal sinuses: Secondary | ICD-10-CM | POA: Diagnosis present

## 2020-01-06 NOTE — ED Provider Notes (Signed)
[facility] EMERGENCY DEPARTMENT Provider Note   CSN: [id number] Arrival date & time: 01/06/20  0038     History Chief Complaint  Patient presents with  . Sinusitis    [name] is a 69 y.o. male.  Patient presents to the emergency department with a chief complaint of rhinitis.  He states that he has persistent rhinitis and sinus congestion.  This is his third ninth visit in the past 6 months.  He is seen frequently for the same complaint.  He states that he has been taking his medicines, but does not get any relief.  He also reports following up with his doctor, but no additional treatment has been given.  He denies any other associated symptoms tonight.  The history is provided by the patient. No language interpreter was used.       Past Medical History:  Diagnosis Date  . BPH (benign prostatic hyperplasia)   . Colon polyp 2010  . Depression   . GERD (gastroesophageal reflux disease)   . Hepatitis C    "caught it when I had a blood transfusion"  . High cholesterol   . History of blood transfusion    "when I was young"  . Hypertension   . Paranoid schizophrenia ([place])   . Prostate atrophy   . Retinal vein occlusion     Patient Active Problem List   Diagnosis Date Noted  . GERD (gastroesophageal reflux disease) 05/03/2019  . Hypotension 08/02/2018  . Low back sprain, initial encounter 10/21/2017  . History of drug abuse ([place]) 10/18/2017  . Gingival erythema 09/23/2017  . Chronic pain of left knee 11/24/2016  . Tobacco abuse   . Diastolic heart failure ([place]) 02/05/2015  . Healthcare maintenance 05/28/2014  . Hyperlipidemia 03/10/2014  . Patellar tendonitis 11/16/2012  . [place] OCCLUSION 06/03/2010  . PARANOID SCHIZOPHRENIA, CHRONIC 01/17/2009  . HYPERTENSION, BENIGN ESSENTIAL 01/17/2009  . BPH (benign prostatic hyperplasia) 01/17/2009  . HEPATITIS C 12/14/2008  . DEPRESSION 12/14/2008    Past Surgical History:  Procedure  Laterality Date  . CIRCUMCISION  1959  . TONSILLECTOMY         Family History  Problem Relation Age of Onset  . Hypertension Mother   . Diabetes Mother   . Stroke Mother     Social History   Tobacco Use  . Smoking status: Current Every Day Smoker    Packs/day: 0.50    Years: 48.00    Pack years: 24.00    Types: Cigarettes  . Smokeless tobacco: Never Used  Substance Use Topics  . Alcohol use: No  . Drug use: No    Home Medications Prior to Admission medications   Medication Sig Start Date End Date Taking? Authorizing Provider  acetaminophen (TYLENOL) 500 MG tablet Take 1 tablet (500 mg total) by mouth every 6 (six) hours as needed. 04/23/18   [name], [name], PA-C  amLODipine (NORVASC) 10 MG tablet Take 1 tablet (10 mg total) by mouth daily. 04/13/18   [name], DO  aspirin 81 MG tablet Take 1 tablet (81 mg total) by mouth daily. 09/24/14   [name], MD  atorvastatin (LIPITOR) 40 MG tablet Take 1 tablet (40 mg total) by mouth daily. 08/15/19   [name], DO  benztropine (COGENTIN) 1 MG tablet Take 1 tablet (1 mg total) by mouth 2 (two) times daily. 11/05/16   [name], MD  cetirizine (ZYRTEC ALLERGY) 10 MG tablet Take 1 tablet (10 mg total) by  mouth daily. 12/09/19   [name], PA-C  famotidine (PEPCID) 20 MG tablet Take 1 tablet (20 mg total) by mouth 2 (two) times daily. 05/02/19   [name], DO  fluPHENAZine decanoate (PROLIXIN) 25 MG/ML injection Inject 25 mg into the muscle every 14 (fourteen) days. Last dose 09/07/12    [provider]  fluticasone (FLONASE) 50 MCG/ACT nasal spray Place 2 sprays into both nostrils daily. 12/11/19   Fatima Blank, MD  loratadine (CLARITIN) 10 MG tablet Take 1 tablet (10 mg total) by mouth daily. 12/28/19   [name], DO  losartan (COZAAR) 100 MG tablet Take 1 tablet (100 mg total) by mouth daily. 11/28/19   Tammi Klippel, Sherin, DO  mometasone (NASONEX) 50 MCG/ACT nasal spray Place 2 sprays into  the nose daily. 12/29/19   [name], PA-C  Multiple Vitamin (MULTIVITAMIN WITH MINERALS) TABS tablet Take 1 tablet by mouth daily. 06/28/14   [name], MD  sodium chloride (OCEAN) 0.65 % SOLN nasal spray Place 1 spray into both nostrils as needed for congestion. 11/19/19   [name], PA-C  tamsulosin (FLOMAX) 0.4 MG CAPS capsule Take 2 capsules (0.8 mg total) by mouth daily. 11/27/17   [name], MD  tamsulosin (FLOMAX) 0.4 MG CAPS capsule TAKE 2 CAPSULE BY MOUTH DAILY 04/17/19   [name], DO  traMADol (ULTRAM) 50 MG tablet Take 1 tablet (50 mg total) by mouth every 6 (six) hours as needed. 07/04/18   [name], MD  triamcinolone cream (KENALOG) 0.1 % Apply 1 application topically 2 (two) times daily. For 5 days, then twice daily as needed for rash and itching. 01/03/15   [name], MD  vitamin C (ASCORBIC ACID) 500 MG tablet Take 500 mg by mouth daily.    [provider]    Allergies    Lisinopril, Penicillins, Amoxicillin, and Ibuprofen  Review of Systems   Review of Systems  Constitutional: Negative for chills and fever.  HENT: Positive for rhinorrhea, sinus pressure and sinus pain.     Physical Exam Updated Vital Signs BP (!) 152/92 (BP Location: Right Arm)   Pulse 80   Temp (!) 97.3 F (36.3 C) (Oral)   Resp 18   Ht 5\' 6"  (1.676 m)   Wt 70 kg   SpO2 100%   BMI 24.91 kg/m   Physical Exam Vitals and nursing note reviewed.  Constitutional:      General: He is not in acute distress.    Appearance: He is well-developed. He is not ill-appearing.  HENT:     Head: Normocephalic and atraumatic.     Nose: Congestion present.  Eyes:     Conjunctiva/sclera: Conjunctivae normal.  Cardiovascular:     Rate and Rhythm: Normal rate.  Pulmonary:     Effort: Pulmonary effort is normal. No respiratory distress.  Abdominal:     General: There is no distension.  Musculoskeletal:     Cervical back: Neck supple.      Comments: Moves all extremities  Skin:    General: Skin is warm and dry.  Neurological:     Mental Status: He is alert and oriented to person, place, and time.  Psychiatric:        Mood and Affect: Mood normal.        Behavior: Behavior normal.     ED Results / Procedures / Treatments   Labs (all labs ordered are listed, but only abnormal results are displayed) Labs Reviewed - No data  to display  EKG None  Radiology No results found.  Procedures Procedures (including critical care time)  Medications Ordered in ED Medications - No data to display  ED Course  I have reviewed the triage vital signs and the nursing notes.  Pertinent labs & imaging results that were available during my care of the patient were reviewed by me and considered in my medical decision making (see chart for details).    MDM Rules/Calculators/A&P                      Patient appears in his normal state of health.  VSS.  Continue antihistamines and nasal sprays at home.  No further emergent workup indicated tonight. Final Clinical Impression(s) / ED Diagnoses Final diagnoses:  Rhinitis, unspecified type    Rx / DC Orders ED Discharge Orders    None       [name], PA-C 01/06/20 0206    Palumbo, April, MD 01/06/20 [id number]

## 2020-01-06 NOTE — ED Triage Notes (Signed)
Patient reports sinus infection and nasal congestion today . Respirations unlabored.

## 2020-01-06 NOTE — Discharge Instructions (Addendum)
You need to discuss your symptoms with your doctor.

## 2020-01-07 ENCOUNTER — Other Ambulatory Visit: Payer: Self-pay

## 2020-01-07 ENCOUNTER — Encounter (HOSPITAL_COMMUNITY): Payer: Self-pay | Admitting: Emergency Medicine

## 2020-01-07 ENCOUNTER — Emergency Department (HOSPITAL_COMMUNITY)
Admission: EM | Admit: 2020-01-07 | Discharge: 2020-01-08 | Disposition: A | Discharge status: Home / Self Care | Payer: Medicare Other | Attending: Emergency Medicine | Admitting: Emergency Medicine

## 2020-01-07 DIAGNOSIS — M25562 Pain in left knee: Secondary | ICD-10-CM | POA: Diagnosis not present

## 2020-01-07 NOTE — ED Notes (Signed)
Pt sitting outside. 

## 2020-01-07 NOTE — ED Triage Notes (Signed)
Patient reports left knee pain today , denies injury/ambulatory .

## 2020-01-08 NOTE — ED Notes (Signed)
Patient left without waiting for his discharge instructions .

## 2020-01-08 NOTE — ED Notes (Signed)
Pt currently resting comfortably in recliner, was independently ambulatory back to H019. Pt remains calm, cooperative with staff.

## 2020-01-08 NOTE — ED Provider Notes (Signed)
[facility] EMERGENCY DEPARTMENT Provider Note   CSN: [id number] Arrival date & time: 01/07/20  2110     History Chief Complaint  Patient presents with  . Knee Pain    [name] is a 69 y.o. male.  Patient presents to the emergency department with chief complaint of left knee pain.  He states that now his left knee is feeling much stronger and feels good now.  He denies any complaints at this time.  He denies any other associated symptoms.  He is seen frequently in the emergency department.  The history is provided by the patient. No language interpreter was used.       Past Medical History:  Diagnosis Date  . BPH (benign prostatic hyperplasia)   . Colon polyp 2010  . Depression   . GERD (gastroesophageal reflux disease)   . Hepatitis C    "caught it when I had a blood transfusion"  . High cholesterol   . History of blood transfusion    "when I was young"  . Hypertension   . Paranoid schizophrenia ([place])   . Prostate atrophy   . Retinal vein occlusion     Patient Active Problem List   Diagnosis Date Noted  . GERD (gastroesophageal reflux disease) 05/03/2019  . Hypotension 08/02/2018  . Low back sprain, initial encounter 10/21/2017  . History of drug abuse ([place]) 10/18/2017  . Gingival erythema 09/23/2017  . Chronic pain of left knee 11/24/2016  . Tobacco abuse   . Diastolic heart failure ([place]) 02/05/2015  . Healthcare maintenance 05/28/2014  . Hyperlipidemia 03/10/2014  . Patellar tendonitis 11/16/2012  . [place] OCCLUSION 06/03/2010  . PARANOID SCHIZOPHRENIA, CHRONIC 01/17/2009  . HYPERTENSION, BENIGN ESSENTIAL 01/17/2009  . BPH (benign prostatic hyperplasia) 01/17/2009  . HEPATITIS C 12/14/2008  . DEPRESSION 12/14/2008    Past Surgical History:  Procedure Laterality Date  . CIRCUMCISION  1959  . TONSILLECTOMY         Family History  Problem Relation Age of Onset  . Hypertension Mother   . Diabetes Mother   .  Stroke Mother     Social History   Tobacco Use  . Smoking status: Current Every Day Smoker    Packs/day: 0.50    Years: 48.00    Pack years: 24.00    Types: Cigarettes  . Smokeless tobacco: Never Used  Substance Use Topics  . Alcohol use: No  . Drug use: No    Home Medications Prior to Admission medications   Medication Sig Start Date End Date Taking? Authorizing Provider  acetaminophen (TYLENOL) 500 MG tablet Take 1 tablet (500 mg total) by mouth every 6 (six) hours as needed. 04/23/18   [name], [name], PA-C  amLODipine (NORVASC) 10 MG tablet Take 1 tablet (10 mg total) by mouth daily. 04/13/18   [name], DO  aspirin 81 MG tablet Take 1 tablet (81 mg total) by mouth daily. 09/24/14   [name], MD  atorvastatin (LIPITOR) 40 MG tablet Take 1 tablet (40 mg total) by mouth daily. 08/15/19   [name], DO  benztropine (COGENTIN) 1 MG tablet Take 1 tablet (1 mg total) by mouth 2 (two) times daily. 11/05/16   [name], MD  cetirizine (ZYRTEC ALLERGY) 10 MG tablet Take 1 tablet (10 mg total) by mouth daily. 12/09/19   [name], PA-C  famotidine (PEPCID) 20 MG tablet Take 1 tablet (20 mg total) by mouth 2 (two) times daily. 05/02/19  [name], DO  fluPHENAZine decanoate (PROLIXIN) 25 MG/ML injection Inject 25 mg into the muscle every 14 (fourteen) days. Last dose 09/07/12    [provider]  fluticasone (FLONASE) 50 MCG/ACT nasal spray Place 2 sprays into both nostrils daily. 12/11/19   Fatima Blank, MD  loratadine (CLARITIN) 10 MG tablet Take 1 tablet (10 mg total) by mouth daily. 12/28/19   [name], DO  losartan (COZAAR) 100 MG tablet Take 1 tablet (100 mg total) by mouth daily. 11/28/19   Tammi Klippel, Sherin, DO  mometasone (NASONEX) 50 MCG/ACT nasal spray Place 2 sprays into the nose daily. 12/29/19   [name], PA-C  Multiple Vitamin (MULTIVITAMIN WITH MINERALS) TABS tablet Take 1 tablet by mouth daily. 06/28/14   [name], MD  sodium chloride (OCEAN) 0.65 % SOLN nasal spray Place 1 spray into both nostrils as needed for congestion. 11/19/19   [name], PA-C  tamsulosin (FLOMAX) 0.4 MG CAPS capsule Take 2 capsules (0.8 mg total) by mouth daily. 11/27/17   [name], MD  tamsulosin (FLOMAX) 0.4 MG CAPS capsule TAKE 2 CAPSULE BY MOUTH DAILY 04/17/19   [name], DO  traMADol (ULTRAM) 50 MG tablet Take 1 tablet (50 mg total) by mouth every 6 (six) hours as needed. 07/04/18   [name], MD  triamcinolone cream (KENALOG) 0.1 % Apply 1 application topically 2 (two) times daily. For 5 days, then twice daily as needed for rash and itching. 01/03/15   [name], MD  vitamin C (ASCORBIC ACID) 500 MG tablet Take 500 mg by mouth daily.    [provider]    Allergies    Lisinopril, Penicillins, Amoxicillin, and Ibuprofen  Review of Systems   Review of Systems  Constitutional: Negative for fever.  Musculoskeletal: Positive for arthralgias. Negative for gait problem and joint swelling.    Physical Exam Updated Vital Signs BP (!) 154/91 (BP Location: Right Arm)   Pulse 79   Temp 98.5 F (36.9 C) (Oral)   Resp 16   Ht 5\' 9"  (1.753 m)   Wt 74.4 kg   SpO2 95%   BMI 24.22 kg/m   Physical Exam Nursing note and vitals reviewed.  Constitutional: Pt appears well-developed and well-nourished. No distress.  HENT:  Head: Normocephalic and atraumatic.  Eyes: Conjunctivae are normal.  Neck: Normal range of motion.  Cardiovascular: Normal rate, regular rhythm. Intact distal pulses.   Capillary refill < 3 sec.  Pulmonary/Chest: Effort normal and breath sounds normal.  Musculoskeletal:   Pt exhibits no bony abnormality or deformity noted about the left knee, there is no erythema or effusion present.   ROM: 5/5  Strength: 5/5 Neurological: Pt  is alert. Coordination normal.  Sensation: 5/5 Skin: Skin is warm and dry. Pt is not diaphoretic.  No evidence of open wound or  skin tenting Psychiatric: Pt has a normal mood and affect.    ED Results / Procedures / Treatments   Labs (all labs ordered are listed, but only abnormal results are displayed) Labs Reviewed - No data to display  EKG None  Radiology No results found.  Procedures Procedures (including critical care time)  Medications Ordered in ED Medications - No data to display  ED Course  I have reviewed the triage vital signs and the nursing notes.  Pertinent labs & imaging results that were available during my care of the patient were reviewed by me and considered in my medical decision making (see chart for details).  MDM Rules/Calculators/A&P                      Patient originally presented to the ED tonight with left knee pain.  When I evaluated him, he was just finishing up a sandwich.  He states that his left knee now feels back to normal.  He denies any other complaints.  He has no abnormality on exam.  I do not feel that any further work-up, imaging, or consultations indicated.  Patient stable ready for discharge. Final Clinical Impression(s) / ED Diagnoses Final diagnoses:  Left knee pain, unspecified chronicity    Rx / DC Orders ED Discharge Orders    None       [name], PA-C 01/08/20 0507    Fatima Blank, MD 01/08/20 [id number]

## 2020-01-09 ENCOUNTER — Emergency Department (HOSPITAL_COMMUNITY)
Admission: EM | Admit: 2020-01-09 | Discharge: 2020-01-09 | Disposition: A | Discharge status: Home / Self Care | Payer: Medicare Other | Attending: Emergency Medicine | Admitting: Emergency Medicine

## 2020-01-09 ENCOUNTER — Other Ambulatory Visit: Payer: Self-pay

## 2020-01-09 ENCOUNTER — Encounter (HOSPITAL_COMMUNITY): Payer: Self-pay | Admitting: Emergency Medicine

## 2020-01-09 DIAGNOSIS — G8929 Other chronic pain: Secondary | ICD-10-CM | POA: Diagnosis not present

## 2020-01-09 DIAGNOSIS — N400 Enlarged prostate without lower urinary tract symptoms: Secondary | ICD-10-CM

## 2020-01-09 DIAGNOSIS — I10 Essential (primary) hypertension: Secondary | ICD-10-CM | POA: Insufficient documentation

## 2020-01-09 DIAGNOSIS — B182 Chronic viral hepatitis C: Secondary | ICD-10-CM | POA: Insufficient documentation

## 2020-01-09 DIAGNOSIS — M25562 Pain in left knee: Secondary | ICD-10-CM | POA: Diagnosis not present

## 2020-01-09 DIAGNOSIS — F200 Paranoid schizophrenia: Secondary | ICD-10-CM | POA: Diagnosis not present

## 2020-01-09 MED ORDER — LIDOCAINE 5 % EX PTCH
1.0000 | MEDICATED_PATCH | CUTANEOUS | Status: DC
  Administered 2020-01-09: 1 via TRANSDERMAL
  Filled 2020-01-09: qty 1

## 2020-01-09 MED ORDER — TAMSULOSIN HCL 0.4 MG PO CAPS: ORAL_CAPSULE | ORAL | 0 refills | Status: DC

## 2020-01-09 NOTE — ED Provider Notes (Signed)
[facility] EMERGENCY DEPARTMENT Provider Note   CSN: [id number] Arrival date & time: 01/09/20  2120     History Chief Complaint  Patient presents with  . Knee Pain    [name] is a 69 y.o. male with past medical history significant for paranoid schizophrenia, hypertension, hepatitis C, GERD who presents for evaluation of left knee pain.  Patient states he has chronic knee pain.  States he has been walking today which aggravated his knee.  States since he has been sitting in the waiting room his knee pain is actually improved and only rates a 1/10.  Denies fever, chills, nausea, vomiting, chest pain, shortness of breath, abdominal pain, redness, swelling, warmth, decreased range of motion, history of DVT, recent falls or injuries.  Denies aggravating or alleviating factors. Has been able to walk without difficulty.  History obtained from patient and past medical records.  No interpreter is used.  HPI     Past Medical History:  Diagnosis Date  . BPH (benign prostatic hyperplasia)   . Colon polyp 2010  . Depression   . GERD (gastroesophageal reflux disease)   . Hepatitis C    "caught it when I had a blood transfusion"  . High cholesterol   . History of blood transfusion    "when I was young"  . Hypertension   . Paranoid schizophrenia ([place])   . Prostate atrophy   . Retinal vein occlusion     Patient Active Problem List   Diagnosis Date Noted  . GERD (gastroesophageal reflux disease) 05/03/2019  . Hypotension 08/02/2018  . Low back sprain, initial encounter 10/21/2017  . History of drug abuse ([place]) 10/18/2017  . Gingival erythema 09/23/2017  . Chronic pain of left knee 11/24/2016  . Tobacco abuse   . Diastolic heart failure ([place]) 02/05/2015  . Healthcare maintenance 05/28/2014  . Hyperlipidemia 03/10/2014  . Patellar tendonitis 11/16/2012  . [place] OCCLUSION 06/03/2010  . PARANOID SCHIZOPHRENIA, CHRONIC 01/17/2009  . HYPERTENSION,  BENIGN ESSENTIAL 01/17/2009  . BPH (benign prostatic hyperplasia) 01/17/2009  . HEPATITIS C 12/14/2008  . DEPRESSION 12/14/2008    Past Surgical History:  Procedure Laterality Date  . CIRCUMCISION  1959  . TONSILLECTOMY         Family History  Problem Relation Age of Onset  . Hypertension Mother   . Diabetes Mother   . Stroke Mother     Social History   Tobacco Use  . Smoking status: Current Every Day Smoker    Packs/day: 0.50    Years: 48.00    Pack years: 24.00    Types: Cigarettes  . Smokeless tobacco: Never Used  Substance Use Topics  . Alcohol use: No  . Drug use: No    Home Medications Prior to Admission medications   Medication Sig Start Date End Date Taking? Authorizing Provider  acetaminophen (TYLENOL) 500 MG tablet Take 1 tablet (500 mg total) by mouth every 6 (six) hours as needed. 04/23/18   [name], [name], PA-C  amLODipine (NORVASC) 10 MG tablet Take 1 tablet (10 mg total) by mouth daily. 04/13/18   [name], DO  aspirin 81 MG tablet Take 1 tablet (81 mg total) by mouth daily. 09/24/14   [name], MD  atorvastatin (LIPITOR) 40 MG tablet Take 1 tablet (40 mg total) by mouth daily. 08/15/19   [name], DO  benztropine (COGENTIN) 1 MG tablet Take 1 tablet (1 mg total) by mouth 2 (two) times daily. 11/05/16  [name], MD  cetirizine (ZYRTEC ALLERGY) 10 MG tablet Take 1 tablet (10 mg total) by mouth daily. 12/09/19   [name], PA-C  famotidine (PEPCID) 20 MG tablet Take 1 tablet (20 mg total) by mouth 2 (two) times daily. 05/02/19   [name], DO  fluPHENAZine decanoate (PROLIXIN) 25 MG/ML injection Inject 25 mg into the muscle every 14 (fourteen) days. Last dose 09/07/12    [provider]  fluticasone (FLONASE) 50 MCG/ACT nasal spray Place 2 sprays into both nostrils daily. 12/11/19   Fatima Blank, MD  loratadine (CLARITIN) 10 MG tablet Take 1 tablet (10 mg total) by mouth daily. 12/28/19   [name], DO    losartan (COZAAR) 100 MG tablet Take 1 tablet (100 mg total) by mouth daily. 11/28/19   Tammi Klippel, Sherin, DO  mometasone (NASONEX) 50 MCG/ACT nasal spray Place 2 sprays into the nose daily. 12/29/19   [name], PA-C  Multiple Vitamin (MULTIVITAMIN WITH MINERALS) TABS tablet Take 1 tablet by mouth daily. 06/28/14   [name], MD  sodium chloride (OCEAN) 0.65 % SOLN nasal spray Place 1 spray into both nostrils as needed for congestion. 11/19/19   [name], PA-C  tamsulosin (FLOMAX) 0.4 MG CAPS capsule Take 2 capsules (0.8 mg total) by mouth daily. 11/27/17   [name], MD  tamsulosin (FLOMAX) 0.4 MG CAPS capsule TAKE 2 CAPSULE BY MOUTH DAILY 01/09/20   [name], DO  traMADol (ULTRAM) 50 MG tablet Take 1 tablet (50 mg total) by mouth every 6 (six) hours as needed. 07/04/18   [name], MD  triamcinolone cream (KENALOG) 0.1 % Apply 1 application topically 2 (two) times daily. For 5 days, then twice daily as needed for rash and itching. 01/03/15   [name], MD  vitamin C (ASCORBIC ACID) 500 MG tablet Take 500 mg by mouth daily.    [provider]    Allergies    Lisinopril, Penicillins, Amoxicillin, and Ibuprofen  Review of Systems   Review of Systems  Constitutional: Negative.   HENT: Negative.   Respiratory: Negative.   Cardiovascular: Negative.   Gastrointestinal: Negative.   Genitourinary: Negative.   Musculoskeletal: Negative.        Left knee pain  Skin: Negative.   Neurological: Negative.   All other systems reviewed and are negative.   Physical Exam Updated Vital Signs BP (!) 178/86 (BP Location: Right Arm)   Pulse 69   Temp 98.3 F (36.8 C) (Oral)   Resp 14   SpO2 99%   Physical Exam Vitals and nursing note reviewed.  Constitutional:      General: He is not in acute distress.    Appearance: He is well-developed. He is not ill-appearing, toxic-appearing or diaphoretic.  HENT:     Head: Normocephalic and  atraumatic.     Nose: Nose normal.     Mouth/Throat:     Mouth: Mucous membranes are moist.  Eyes:     Pupils: Pupils are equal, round, and reactive to light.  Cardiovascular:     Rate and Rhythm: Normal rate and regular rhythm.     Pulses: Normal pulses.     Heart sounds: Normal heart sounds.  Pulmonary:     Effort: Pulmonary effort is normal. No respiratory distress.     Breath sounds: Normal breath sounds.  Abdominal:     General: Bowel sounds are normal. There is no distension.     Palpations: Abdomen is soft.  Musculoskeletal:  General: Normal range of motion.     Cervical back: Normal range of motion and neck supple.     Lumbar back: Normal.     Right hip: Normal.     Left hip: Normal.     Right upper leg: Normal.     Left upper leg: Normal.     Right knee: Normal.     Left knee: Normal.     Right lower leg: Normal.     Left lower leg: Normal.     Right ankle: Normal.     Left ankle: Normal.     Right foot: Normal.     Left foot: Normal.     Comments: Moves all 4 extremities without difficulty.  No bony tenderness.  Able to flex and extend at bilateral lower extremities.  Able to straight leg raise.  Negative varus, valgus stress.  Negative anterior drawer.  No tenderness to popliteal fossa.  No obvious effusion. Homans sign negative  Skin:    General: Skin is warm and dry.     Capillary Refill: Capillary refill takes less than 2 seconds.     Comments: No edema, erythema or warmth.  No fluctuance or induration.  Neurological:     General: No focal deficit present.     Mental Status: He is alert.     Sensory: Sensation is intact.     Motor: Motor function is intact.     Coordination: Coordination is intact.     Gait: Gait is intact.     Comments: Ambulatory in room without difficulty. 5/5 strength bilateral lower extremities without difficulty Intact sensation without difficulty     ED Results / Procedures / Treatments   Labs (all labs ordered are  listed, but only abnormal results are displayed) Labs Reviewed - No data to display  EKG None  Radiology No results found.  Procedures Procedures (including critical care time)  Medications Ordered in ED Medications  lidocaine (LIDODERM) 5 % 1 patch (1 patch Transdermal Patch Applied 01/09/20 2305)    ED Course  I have reviewed the triage vital signs and the nursing notes.  Pertinent labs & imaging results that were available during my care of the patient were reviewed by me and considered in my medical decision making (see chart for details).  69 year old male presents for evaluation of left knee pain.  He has been seen previously for similar complaints, most recently 48 hours ago.  Patient is homeless and does walk frequently.  He denies any injury or trauma.  He has a nonfocal neuro exam without deficits.  Normal musculoskeletal exam.  He is ambulatory in room without difficulty.  No overlying skin changes.  Likely patient's chronic pain which he actually states has significantly improved by initial evaluation only rates it a 1/10.  Do not feel we need imaging at this time given no trauma.  He appears overall well.  Bevelyn Buckles' sign negative, no tachycardia, tachypnea or hypoxia.  No unilateral leg swelling, redness or warmth.  I low suspicion for DVT, vascular occlusion or myositis.  No effusion, overlying skin changes to suggest infectious process.  Low suspicion for septic joint, gout, hemarthrosis.  Patient DC home with lidocaine patch.  Suggest ice and elevation.  He may return for new or worsening symptoms.  The patient has been appropriately medically screened and/or stabilized in the ED. I have low suspicion for any other emergent medical condition which would require further screening, evaluation or treatment in the ED or require inpatient management.  Patient is hemodynamically stable and in no acute distress.  Patient able to ambulate in department prior to ED.  Evaluation does not  show acute pathology that would require ongoing or additional emergent interventions while in the emergency department or further inpatient treatment.  I have discussed the diagnosis with the patient and answered all questions.  Pain is been managed while in the emergency department and patient has no further complaints prior to discharge.  Patient is comfortable with plan discussed in room and is stable for discharge at this time.  I have discussed strict return precautions for returning to the emergency department.  Patient was encouraged to follow-up with PCP/specialist refer to at discharge.    MDM Rules/Calculators/A&P                       Final Clinical Impression(s) / ED Diagnoses Final diagnoses:  Chronic pain of left knee    Rx / DC Orders ED Discharge Orders    None       Tammy Ericsson A, PA-C 01/09/20 2308    Lucrezia Starch, MD 01/10/20 1008

## 2020-01-09 NOTE — ED Triage Notes (Signed)
Pt c/o left knee pain after walking all day.

## 2020-01-09 NOTE — ED Notes (Signed)
Patient verbalizes understanding of discharge instructions. Opportunity for questioning and answers were provided. Armband removed by staff, pt discharged from ED stable & ambulatory  

## 2020-01-12 ENCOUNTER — Other Ambulatory Visit: Payer: Self-pay

## 2020-01-12 ENCOUNTER — Emergency Department (HOSPITAL_COMMUNITY)
Admission: EM | Admit: 2020-01-12 | Discharge: 2020-01-12 | Discharge status: Against Medical Advice | Payer: Medicare Other

## 2020-01-12 NOTE — ED Notes (Signed)
Was seen registering, but has not been seen for 1 hr and half.

## 2020-01-13 ENCOUNTER — Other Ambulatory Visit: Payer: Self-pay

## 2020-01-13 ENCOUNTER — Emergency Department (HOSPITAL_COMMUNITY)
Admission: EM | Admit: 2020-01-13 | Discharge: 2020-01-14 | Discharge status: Against Medical Advice | Payer: Medicare Other | Source: Home / Self Care | Attending: Emergency Medicine | Admitting: Emergency Medicine

## 2020-01-13 ENCOUNTER — Emergency Department (HOSPITAL_COMMUNITY)
Admission: EM | Admit: 2020-01-13 | Discharge: 2020-01-13 | Discharge status: Against Medical Advice | Payer: Medicare Other | Attending: Emergency Medicine | Admitting: Emergency Medicine

## 2020-01-13 ENCOUNTER — Encounter (HOSPITAL_COMMUNITY): Payer: Self-pay | Admitting: Emergency Medicine

## 2020-01-13 ENCOUNTER — Encounter (HOSPITAL_COMMUNITY): Payer: Self-pay

## 2020-01-13 DIAGNOSIS — Z5321 Procedure and treatment not carried out due to patient leaving prior to being seen by health care provider: Secondary | ICD-10-CM | POA: Insufficient documentation

## 2020-01-13 DIAGNOSIS — R519 Headache, unspecified: Secondary | ICD-10-CM | POA: Diagnosis present

## 2020-01-13 NOTE — ED Triage Notes (Signed)
Pt here for his usual sinuses feeling congested

## 2020-01-13 NOTE — ED Notes (Signed)
Pt called x 3  No answer. 

## 2020-01-13 NOTE — ED Triage Notes (Signed)
Pt c/o left leg pain. Ambulatory without difficulty.

## 2020-01-14 ENCOUNTER — Other Ambulatory Visit: Payer: Self-pay

## 2020-01-14 ENCOUNTER — Emergency Department (HOSPITAL_COMMUNITY)
Admission: EM | Admit: 2020-01-14 | Discharge: 2020-01-15 | Disposition: A | Discharge status: Home / Self Care | Payer: Medicare Other | Attending: Emergency Medicine | Admitting: Emergency Medicine

## 2020-01-14 ENCOUNTER — Encounter (HOSPITAL_COMMUNITY): Payer: Self-pay | Admitting: Emergency Medicine

## 2020-01-14 DIAGNOSIS — F17210 Nicotine dependence, cigarettes, uncomplicated: Secondary | ICD-10-CM | POA: Diagnosis not present

## 2020-01-14 DIAGNOSIS — Z7982 Long term (current) use of aspirin: Secondary | ICD-10-CM | POA: Diagnosis not present

## 2020-01-14 DIAGNOSIS — F200 Paranoid schizophrenia: Secondary | ICD-10-CM | POA: Insufficient documentation

## 2020-01-14 DIAGNOSIS — I10 Essential (primary) hypertension: Secondary | ICD-10-CM | POA: Insufficient documentation

## 2020-01-14 DIAGNOSIS — J321 Chronic frontal sinusitis: Secondary | ICD-10-CM | POA: Insufficient documentation

## 2020-01-14 DIAGNOSIS — Z79899 Other long term (current) drug therapy: Secondary | ICD-10-CM | POA: Insufficient documentation

## 2020-01-14 DIAGNOSIS — M25562 Pain in left knee: Secondary | ICD-10-CM | POA: Diagnosis present

## 2020-01-14 DIAGNOSIS — G8929 Other chronic pain: Secondary | ICD-10-CM | POA: Insufficient documentation

## 2020-01-14 NOTE — ED Triage Notes (Signed)
Pt c/o left knee pain for a while, denies any injury able to ambulate to triage without difficulty.

## 2020-01-15 ENCOUNTER — Other Ambulatory Visit: Payer: Self-pay

## 2020-01-15 ENCOUNTER — Emergency Department (HOSPITAL_COMMUNITY)
Admission: EM | Admit: 2020-01-15 | Discharge: 2020-01-16 | Disposition: A | Discharge status: Home / Self Care | Payer: Medicare Other | Source: Home / Self Care | Attending: Emergency Medicine | Admitting: Emergency Medicine

## 2020-01-15 ENCOUNTER — Encounter (HOSPITAL_COMMUNITY): Payer: Self-pay | Admitting: Emergency Medicine

## 2020-01-15 DIAGNOSIS — G8929 Other chronic pain: Secondary | ICD-10-CM | POA: Diagnosis not present

## 2020-01-15 NOTE — ED Provider Notes (Signed)
[facility] EMERGENCY DEPARTMENT Provider Note   CSN: [id number] Arrival date & time: 01/14/20  2333     History Chief Complaint  Patient presents with  . Knee Pain    [name] is a 69 y.o. male.  Patient to ED with c/o sinusitis, "my sinuses are acting up". Complaint is different from complaint at triage check in which was for left knee pain. No new symptoms. No fever. No fall or injury.  The history is provided by the patient. No language interpreter was used.       Past Medical History:  Diagnosis Date  . BPH (benign prostatic hyperplasia)   . Colon polyp 2010  . Depression   . GERD (gastroesophageal reflux disease)   . Hepatitis C    "caught it when I had a blood transfusion"  . High cholesterol   . History of blood transfusion    "when I was young"  . Hypertension   . Paranoid schizophrenia ([place])   . Prostate atrophy   . Retinal vein occlusion     Patient Active Problem List   Diagnosis Date Noted  . GERD (gastroesophageal reflux disease) 05/03/2019  . Hypotension 08/02/2018  . Low back sprain, initial encounter 10/21/2017  . History of drug abuse ([place]) 10/18/2017  . Gingival erythema 09/23/2017  . Chronic pain of left knee 11/24/2016  . Tobacco abuse   . Diastolic heart failure ([place]) 02/05/2015  . Healthcare maintenance 05/28/2014  . Hyperlipidemia 03/10/2014  . Patellar tendonitis 11/16/2012  . [place] OCCLUSION 06/03/2010  . PARANOID SCHIZOPHRENIA, CHRONIC 01/17/2009  . HYPERTENSION, BENIGN ESSENTIAL 01/17/2009  . BPH (benign prostatic hyperplasia) 01/17/2009  . HEPATITIS C 12/14/2008  . DEPRESSION 12/14/2008    Past Surgical History:  Procedure Laterality Date  . CIRCUMCISION  1959  . TONSILLECTOMY         Family History  Problem Relation Age of Onset  . Hypertension Mother   . Diabetes Mother   . Stroke Mother     Social History   Tobacco Use  . Smoking status: Current Every Day Smoker   Packs/day: 0.50    Years: 48.00    Pack years: 24.00    Types: Cigarettes  . Smokeless tobacco: Never Used  Substance Use Topics  . Alcohol use: No  . Drug use: No    Home Medications Prior to Admission medications   Medication Sig Start Date End Date Taking? Authorizing Provider  acetaminophen (TYLENOL) 500 MG tablet Take 1 tablet (500 mg total) by mouth every 6 (six) hours as needed. 04/23/18   [name], [name], PA-C  amLODipine (NORVASC) 10 MG tablet Take 1 tablet (10 mg total) by mouth daily. 04/13/18   [name], DO  aspirin 81 MG tablet Take 1 tablet (81 mg total) by mouth daily. 09/24/14   [name], MD  atorvastatin (LIPITOR) 40 MG tablet Take 1 tablet (40 mg total) by mouth daily. 08/15/19   [name], DO  benztropine (COGENTIN) 1 MG tablet Take 1 tablet (1 mg total) by mouth 2 (two) times daily. 11/05/16   [name], MD  cetirizine (ZYRTEC ALLERGY) 10 MG tablet Take 1 tablet (10 mg total) by mouth daily. 12/09/19   [name], PA-C  famotidine (PEPCID) 20 MG tablet Take 1 tablet (20 mg total) by mouth 2 (two) times daily. 05/02/19   [name], DO  fluPHENAZine decanoate (PROLIXIN) 25 MG/ML injection Inject 25 mg into the muscle every 14 (fourteen) days.  Last dose 09/07/12    [provider]  fluticasone (FLONASE) 50 MCG/ACT nasal spray Place 2 sprays into both nostrils daily. 12/11/19   Fatima Blank, MD  loratadine (CLARITIN) 10 MG tablet Take 1 tablet (10 mg total) by mouth daily. 12/28/19   [name], DO  losartan (COZAAR) 100 MG tablet Take 1 tablet (100 mg total) by mouth daily. 11/28/19   Tammi Klippel, Sherin, DO  mometasone (NASONEX) 50 MCG/ACT nasal spray Place 2 sprays into the nose daily. 12/29/19   [name], PA-C  Multiple Vitamin (MULTIVITAMIN WITH MINERALS) TABS tablet Take 1 tablet by mouth daily. 06/28/14   [name], MD  sodium chloride (OCEAN) 0.65 % SOLN nasal spray Place 1 spray into both nostrils as needed  for congestion. 11/19/19   [name], PA-C  tamsulosin (FLOMAX) 0.4 MG CAPS capsule Take 2 capsules (0.8 mg total) by mouth daily. 11/27/17   [name], MD  tamsulosin (FLOMAX) 0.4 MG CAPS capsule TAKE 2 CAPSULE BY MOUTH DAILY 01/09/20   [name], DO  traMADol (ULTRAM) 50 MG tablet Take 1 tablet (50 mg total) by mouth every 6 (six) hours as needed. 07/04/18   [name], MD  triamcinolone cream (KENALOG) 0.1 % Apply 1 application topically 2 (two) times daily. For 5 days, then twice daily as needed for rash and itching. 01/03/15   [name], MD  vitamin C (ASCORBIC ACID) 500 MG tablet Take 500 mg by mouth daily.    [provider]    Allergies    Lisinopril, Penicillins, Amoxicillin, and Ibuprofen  Review of Systems   Review of Systems  Constitutional: Negative for chills and fever.  HENT: Positive for congestion, sinus pressure and sinus pain.   Respiratory: Negative for cough.   Gastrointestinal: Negative for nausea.  Musculoskeletal:       See HPI.  Skin: Negative for wound.  Neurological: Negative for headaches.    Physical Exam Updated Vital Signs BP (!) 158/97 (BP Location: Right Arm)   Pulse 91   Temp 98.8 F (37.1 C) (Oral)   Resp 18   Ht 5\' 9"  (1.753 m)   Wt 74.4 kg   SpO2 100%   BMI 24.22 kg/m   Physical Exam Vitals and nursing note reviewed.  Constitutional:      Appearance: He is well-developed.  HENT:     Nose: Nose normal. No congestion.  Pulmonary:     Effort: Pulmonary effort is normal.  Musculoskeletal:        General: Normal range of motion.     Cervical back: Normal range of motion.     Comments: No swelling of left knee. No discoloration. Patient is ambulatory and fully weight bearing without favoritism of left leg.   Skin:    General: Skin is warm and dry.  Neurological:     Mental Status: He is alert and oriented to person, place, and time.     ED Results / Procedures / Treatments   Labs (all  labs ordered are listed, but only abnormal results are displayed) Labs Reviewed - No data to display  EKG None  Radiology No results found.  Procedures Procedures (including critical care time)  Medications Ordered in ED Medications - No data to display  ED Course  I have reviewed the triage vital signs and the nursing notes.  Pertinent labs & imaging results that were available during my care of the patient were reviewed by me and considered in my medical  decision making (see chart for details).    MDM Rules/Calculators/A&P                      Patient well known to the ED with 45 visits in the past 6 months, frequently for identical complaints.   He is well appearing. VSS, afebrile. Ambulatory. Recommended PCP follow up.  Final Clinical Impression(s) / ED Diagnoses Final diagnoses:  None   1. Chronic knee pain 2. Chronic sinusitis  Rx / DC Orders ED Discharge Orders    None       Dennie Bible 01/15/20 0107    Fatima Blank, MD 01/15/20 [phone]

## 2020-01-15 NOTE — ED Triage Notes (Signed)
Patient reports left knee pain today , denies injury /ambulatory .

## 2020-01-15 NOTE — Discharge Instructions (Addendum)
Please go to your doctor for management of chronic symptoms.

## 2020-01-15 NOTE — ED Notes (Signed)
One touch pt, see provider's assessment.  Pt was able to hop out of bed and walk to the wheelchair w/ no problem.  Pt was given a sandwich and asked to stay in the lobby.  RN explained that the rule was he must leave after being seen.  Pt did not want RN to complete DC vitals.  Pt remained calm and otherwise cooperative.

## 2020-01-16 NOTE — ED Provider Notes (Signed)
[facility] EMERGENCY DEPARTMENT Provider Note   CSN: [id number] Arrival date & time: 01/15/20  2233     History Chief Complaint  Patient presents with  . Knee Pain    [name] is a 69 y.o. male.  The history is provided by the patient and medical records.  Knee Pain   69 year old male with history of BPH, GERD, hep C, hyperlipidemia, hypertension, schizophrenia, presenting to the ED with knee pain.  Triage note reports left knee, patient states is both knees.  He was noted to be running through the lobby.  On exam he is changing his socks and asking for coffee.  He denies any falls or trauma.  Past Medical History:  Diagnosis Date  . BPH (benign prostatic hyperplasia)   . Colon polyp 2010  . Depression   . GERD (gastroesophageal reflux disease)   . Hepatitis C    "caught it when I had a blood transfusion"  . High cholesterol   . History of blood transfusion    "when I was young"  . Hypertension   . Paranoid schizophrenia ([place])   . Prostate atrophy   . Retinal vein occlusion     Patient Active Problem List   Diagnosis Date Noted  . GERD (gastroesophageal reflux disease) 05/03/2019  . Hypotension 08/02/2018  . Low back sprain, initial encounter 10/21/2017  . History of drug abuse ([place]) 10/18/2017  . Gingival erythema 09/23/2017  . Chronic pain of left knee 11/24/2016  . Tobacco abuse   . Diastolic heart failure ([place]) 02/05/2015  . Healthcare maintenance 05/28/2014  . Hyperlipidemia 03/10/2014  . Patellar tendonitis 11/16/2012  . [place] OCCLUSION 06/03/2010  . PARANOID SCHIZOPHRENIA, CHRONIC 01/17/2009  . HYPERTENSION, BENIGN ESSENTIAL 01/17/2009  . BPH (benign prostatic hyperplasia) 01/17/2009  . HEPATITIS C 12/14/2008  . DEPRESSION 12/14/2008    Past Surgical History:  Procedure Laterality Date  . CIRCUMCISION  1959  . TONSILLECTOMY         Family History  Problem Relation Age of Onset  . Hypertension Mother    . Diabetes Mother   . Stroke Mother     Social History   Tobacco Use  . Smoking status: Current Every Day Smoker    Packs/day: 0.50    Years: 48.00    Pack years: 24.00    Types: Cigarettes  . Smokeless tobacco: Never Used  Substance Use Topics  . Alcohol use: Yes  . Drug use: No    Home Medications Prior to Admission medications   Medication Sig Start Date End Date Taking? Authorizing Provider  acetaminophen (TYLENOL) 500 MG tablet Take 1 tablet (500 mg total) by mouth every 6 (six) hours as needed. 04/23/18   [name], [name], PA-C  amLODipine (NORVASC) 10 MG tablet Take 1 tablet (10 mg total) by mouth daily. 04/13/18   [name], DO  aspirin 81 MG tablet Take 1 tablet (81 mg total) by mouth daily. 09/24/14   [name], MD  atorvastatin (LIPITOR) 40 MG tablet Take 1 tablet (40 mg total) by mouth daily. 08/15/19   [name], DO  benztropine (COGENTIN) 1 MG tablet Take 1 tablet (1 mg total) by mouth 2 (two) times daily. 11/05/16   [name], MD  cetirizine (ZYRTEC ALLERGY) 10 MG tablet Take 1 tablet (10 mg total) by mouth daily. 12/09/19   [name], PA-C  famotidine (PEPCID) 20 MG tablet Take 1 tablet (20 mg total) by mouth 2 (two) times daily.  05/02/19   [name], DO  fluPHENAZine decanoate (PROLIXIN) 25 MG/ML injection Inject 25 mg into the muscle every 14 (fourteen) days. Last dose 09/07/12    [provider]  fluticasone (FLONASE) 50 MCG/ACT nasal spray Place 2 sprays into both nostrils daily. 12/11/19   Fatima Blank, MD  loratadine (CLARITIN) 10 MG tablet Take 1 tablet (10 mg total) by mouth daily. 12/28/19   [name], DO  losartan (COZAAR) 100 MG tablet Take 1 tablet (100 mg total) by mouth daily. 11/28/19   Tammi Klippel, Sherin, DO  mometasone (NASONEX) 50 MCG/ACT nasal spray Place 2 sprays into the nose daily. 12/29/19   [name], PA-C  Multiple Vitamin (MULTIVITAMIN WITH MINERALS) TABS tablet Take 1 tablet by mouth daily.  06/28/14   [name], MD  sodium chloride (OCEAN) 0.65 % SOLN nasal spray Place 1 spray into both nostrils as needed for congestion. 11/19/19   [name], PA-C  tamsulosin (FLOMAX) 0.4 MG CAPS capsule Take 2 capsules (0.8 mg total) by mouth daily. 11/27/17   [name], MD  tamsulosin (FLOMAX) 0.4 MG CAPS capsule TAKE 2 CAPSULE BY MOUTH DAILY 01/09/20   [name], DO  traMADol (ULTRAM) 50 MG tablet Take 1 tablet (50 mg total) by mouth every 6 (six) hours as needed. 07/04/18   [name], MD  triamcinolone cream (KENALOG) 0.1 % Apply 1 application topically 2 (two) times daily. For 5 days, then twice daily as needed for rash and itching. 01/03/15   [name], MD  vitamin C (ASCORBIC ACID) 500 MG tablet Take 500 mg by mouth daily.    [provider]    Allergies    Lisinopril, Penicillins, Amoxicillin, and Ibuprofen  Review of Systems   Review of Systems  Musculoskeletal: Positive for arthralgias.  All other systems reviewed and are negative.   Physical Exam Updated Vital Signs BP (!) 149/95 (BP Location: Right Arm)   Pulse 70   Temp 98.6 F (37 C) (Oral)   Resp 15   Ht 5\' 7"  (1.702 m)   Wt 70 kg   SpO2 100%   BMI 24.17 kg/m   Physical Exam Vitals and nursing note reviewed.  Constitutional:      Appearance: He is well-developed.  HENT:     Head: Normocephalic and atraumatic.  Eyes:     Conjunctiva/sclera: Conjunctivae normal.     Pupils: Pupils are equal, round, and reactive to light.  Cardiovascular:     Rate and Rhythm: Normal rate and regular rhythm.     Heart sounds: Normal heart sounds.  Pulmonary:     Effort: Pulmonary effort is normal.     Breath sounds: Normal breath sounds. No stridor. No wheezing.  Abdominal:     General: Bowel sounds are normal.     Palpations: Abdomen is soft.  Musculoskeletal:        General: Normal range of motion.     Cervical back: Normal range of motion.     Comments: Knees normal  in appearence, bending both knees on and off to change socks, ambulatory in hallway  Skin:    General: Skin is warm and dry.  Neurological:     Mental Status: He is alert and oriented to person, place, and time.     ED Results / Procedures / Treatments   Labs (all labs ordered are listed, but only abnormal results are displayed) Labs Reviewed - No data to display  EKG None  Radiology No results  found.  Procedures Procedures (including critical care time)  Medications Ordered in ED Medications - No data to display  ED Course  I have reviewed the triage vital signs and the nursing notes.  Pertinent labs & imaging results that were available during my care of the patient were reviewed by me and considered in my medical decision making (see chart for details).    MDM Rules/Calculators/A&P  69 year old male presenting to the ED with bilateral knee pain.  He is well-known to this facility, 46 visits in the past 6 months for similar related complaints.  On exam he is bending both of his knees easily to change his socks.  He is ambulatory in the hallway and was apparently running around in the lobby.  Knees without any signs of trauma.  He is asking for coffee.  At this point, no emergency condition present.  Stable for discharge.  Final Clinical Impression(s) / ED Diagnoses Final diagnoses:  Chronic knee pain, unspecified laterality    Rx / DC Orders ED Discharge Orders    None       [name], PA-C 01/16/20 [id number]    Orpah Greek, MD 01/16/20 [phone]

## 2020-01-16 NOTE — ED Notes (Signed)
Pt running through lobby.

## 2020-01-17 ENCOUNTER — Emergency Department (HOSPITAL_COMMUNITY)
Admission: EM | Admit: 2020-01-17 | Discharge: 2020-01-17 | Disposition: A | Discharge status: Home / Self Care | Payer: Medicare Other | Attending: Emergency Medicine | Admitting: Emergency Medicine

## 2020-01-17 ENCOUNTER — Encounter (HOSPITAL_COMMUNITY): Payer: Self-pay | Admitting: Emergency Medicine

## 2020-01-17 DIAGNOSIS — Z7982 Long term (current) use of aspirin: Secondary | ICD-10-CM | POA: Diagnosis not present

## 2020-01-17 DIAGNOSIS — I110 Hypertensive heart disease with heart failure: Secondary | ICD-10-CM | POA: Diagnosis not present

## 2020-01-17 DIAGNOSIS — M25562 Pain in left knee: Secondary | ICD-10-CM | POA: Insufficient documentation

## 2020-01-17 DIAGNOSIS — F17210 Nicotine dependence, cigarettes, uncomplicated: Secondary | ICD-10-CM | POA: Diagnosis not present

## 2020-01-17 DIAGNOSIS — I5030 Unspecified diastolic (congestive) heart failure: Secondary | ICD-10-CM | POA: Diagnosis not present

## 2020-01-17 DIAGNOSIS — Z79899 Other long term (current) drug therapy: Secondary | ICD-10-CM | POA: Diagnosis not present

## 2020-01-17 DIAGNOSIS — G8929 Other chronic pain: Secondary | ICD-10-CM

## 2020-01-17 MED ORDER — LIDOCAINE 5 % EX PTCH
1.0000 | MEDICATED_PATCH | CUTANEOUS | Status: DC
  Administered 2020-01-17: 1 via TRANSDERMAL
  Filled 2020-01-17: qty 1

## 2020-01-17 NOTE — Discharge Instructions (Signed)
Use Tylenol, and over-the-counter lidocaine patches as needed for knee pain

## 2020-01-17 NOTE — ED Triage Notes (Signed)
Patient reports knee pain. Ambulatory.

## 2020-01-17 NOTE — ED Notes (Signed)
Patient verbalizes understanding of discharge instructions. Opportunity for questioning and answers were provided. Armband removed by staff, pt discharged from ED in wheelchair to home.   

## 2020-01-17 NOTE — ED Provider Notes (Signed)
[facility] EMERGENCY DEPARTMENT Provider Note   CSN: [id number] Arrival date & time: 01/17/20  0013     History Chief Complaint  Patient presents with  . Knee Pain    [name] is a 69 y.o. male.  [name] is a 69 y.o. male with a history of schizophrenia, hypertension, hyperlipidemia, hepatitis C, GERD, BPH, depression, who is well-known to the ED with 47 visits in the last 6 months, presents today reporting left knee pain.  Patient is ambulatory in triage.  Upon arriving to the room he is sleeping, reports left knee pain but is unwilling to elaborate further, states that he is very tired.  He denies any fevers or swelling of the knee, denies any new injury or trauma to the knee.  States this is his usual knee pain.  He states it has been going on for "a long time".  Unwilling to provide further history.        Past Medical History:  Diagnosis Date  . BPH (benign prostatic hyperplasia)   . Colon polyp 2010  . Depression   . GERD (gastroesophageal reflux disease)   . Hepatitis C    "caught it when I had a blood transfusion"  . High cholesterol   . History of blood transfusion    "when I was young"  . Hypertension   . Paranoid schizophrenia ([place])   . Prostate atrophy   . Retinal vein occlusion     Patient Active Problem List   Diagnosis Date Noted  . GERD (gastroesophageal reflux disease) 05/03/2019  . Hypotension 08/02/2018  . Low back sprain, initial encounter 10/21/2017  . History of drug abuse ([place]) 10/18/2017  . Gingival erythema 09/23/2017  . Chronic pain of left knee 11/24/2016  . Tobacco abuse   . Diastolic heart failure ([place]) 02/05/2015  . Healthcare maintenance 05/28/2014  . Hyperlipidemia 03/10/2014  . Patellar tendonitis 11/16/2012  . [place] OCCLUSION 06/03/2010  . PARANOID SCHIZOPHRENIA, CHRONIC 01/17/2009  . HYPERTENSION, BENIGN ESSENTIAL 01/17/2009  . BPH (benign prostatic hyperplasia) 01/17/2009  .  HEPATITIS C 12/14/2008  . DEPRESSION 12/14/2008    Past Surgical History:  Procedure Laterality Date  . CIRCUMCISION  1959  . TONSILLECTOMY         Family History  Problem Relation Age of Onset  . Hypertension Mother   . Diabetes Mother   . Stroke Mother     Social History   Tobacco Use  . Smoking status: Current Every Day Smoker    Packs/day: 0.50    Years: 48.00    Pack years: 24.00    Types: Cigarettes  . Smokeless tobacco: Never Used  Substance Use Topics  . Alcohol use: Yes  . Drug use: No    Home Medications Prior to Admission medications   Medication Sig Start Date End Date Taking? Authorizing Provider  acetaminophen (TYLENOL) 500 MG tablet Take 1 tablet (500 mg total) by mouth every 6 (six) hours as needed. 04/23/18   [name], [name], PA-C  amLODipine (NORVASC) 10 MG tablet Take 1 tablet (10 mg total) by mouth daily. 04/13/18   [name], DO  aspirin 81 MG tablet Take 1 tablet (81 mg total) by mouth daily. 09/24/14   [name], MD  atorvastatin (LIPITOR) 40 MG tablet Take 1 tablet (40 mg total) by mouth daily. 08/15/19   [name], DO  benztropine (COGENTIN) 1 MG tablet Take 1 tablet (1 mg total) by mouth 2 (two) times  daily. 11/05/16   [name], MD  cetirizine (ZYRTEC ALLERGY) 10 MG tablet Take 1 tablet (10 mg total) by mouth daily. 12/09/19   [name], PA-C  famotidine (PEPCID) 20 MG tablet Take 1 tablet (20 mg total) by mouth 2 (two) times daily. 05/02/19   [name], DO  fluPHENAZine decanoate (PROLIXIN) 25 MG/ML injection Inject 25 mg into the muscle every 14 (fourteen) days. Last dose 09/07/12    [provider]  fluticasone (FLONASE) 50 MCG/ACT nasal spray Place 2 sprays into both nostrils daily. 12/11/19   Fatima Blank, MD  loratadine (CLARITIN) 10 MG tablet Take 1 tablet (10 mg total) by mouth daily. 12/28/19   [name], DO  losartan (COZAAR) 100 MG tablet Take 1 tablet (100 mg total) by mouth daily.  11/28/19   Tammi Klippel, Sherin, DO  mometasone (NASONEX) 50 MCG/ACT nasal spray Place 2 sprays into the nose daily. 12/29/19   [name], PA-C  Multiple Vitamin (MULTIVITAMIN WITH MINERALS) TABS tablet Take 1 tablet by mouth daily. 06/28/14   [name], MD  sodium chloride (OCEAN) 0.65 % SOLN nasal spray Place 1 spray into both nostrils as needed for congestion. 11/19/19   [name], PA-C  tamsulosin (FLOMAX) 0.4 MG CAPS capsule Take 2 capsules (0.8 mg total) by mouth daily. 11/27/17   [name], MD  tamsulosin (FLOMAX) 0.4 MG CAPS capsule TAKE 2 CAPSULE BY MOUTH DAILY 01/09/20   [name], DO  traMADol (ULTRAM) 50 MG tablet Take 1 tablet (50 mg total) by mouth every 6 (six) hours as needed. 07/04/18   [name], MD  triamcinolone cream (KENALOG) 0.1 % Apply 1 application topically 2 (two) times daily. For 5 days, then twice daily as needed for rash and itching. 01/03/15   [name], MD  vitamin C (ASCORBIC ACID) 500 MG tablet Take 500 mg by mouth daily.    [provider]    Allergies    Lisinopril, Penicillins, Amoxicillin, and Ibuprofen  Review of Systems   Review of Systems  Constitutional: Negative for chills and fever.  Respiratory: Positive for cough.   Musculoskeletal: Positive for arthralgias. Negative for joint swelling.  All other systems reviewed and are negative.   Physical Exam Updated Vital Signs BP (!) 156/106 (BP Location: Right Arm)   Pulse 65   Temp 98.2 F (36.8 C) (Oral)   Resp 16   SpO2 100%   Physical Exam Vitals and nursing note reviewed.  Constitutional:      General: He is not in acute distress.    Appearance: Normal appearance. He is well-developed. He is not diaphoretic.     Comments: Sleeping, but easily awakens to verbal stimuli  HENT:     Head: Normocephalic and atraumatic.  Eyes:     General:        Right eye: No discharge.        Left eye: No discharge.  Pulmonary:     Effort: Pulmonary  effort is normal. No respiratory distress.  Musculoskeletal:     Comments: Bilateral knees appear normal without erythema, swelling or warmth on palpation.  Unable to produce any tenderness on exam and patient is able to fully flex and extend bilateral knees.  Distal pulses 2+.  Normal sensation and strength.  Neurological:     Coordination: Coordination normal.  Psychiatric:        Behavior: Behavior normal.     ED Results / Procedures / Treatments   Labs (all  labs ordered are listed, but only abnormal results are displayed) Labs Reviewed - No data to display  EKG None  Radiology No results found.  Procedures Procedures (including critical care time)  Medications Ordered in ED Medications - No data to display  ED Course  I have reviewed the triage vital signs and the nursing notes.  Pertinent labs & imaging results that were available during my care of the patient were reviewed by me and considered in my medical decision making (see chart for details).    MDM Rules/Calculators/A&P                     69 year old male well-known to the department presenting with left knee pain, has been seen numerous times for the same and has had multiple x-rays of the knee.  On exam the knee is not tender, erythematous, swollen or warm and he is able to flex and extend the knee without difficulty, no concern for septic arthritis, no signs of trauma on exam.  Presentation seems distant with patient's chronic knee pain, do not see indication for further work-up.  Provided Lidoderm patch.  At this time there does not appear to be any evidence of an acute emergency medical condition and the patient appears stable for discharge with appropriate outpatient follow up.Diagnosis was discussed with patient who verbalizes understanding and is agreeable to discharge.   Final Clinical Impression(s) / ED Diagnoses Final diagnoses:  Chronic pain of left knee    Rx / DC Orders ED Discharge Orders     None       Janet Berlin 01/17/20 [id number]    Ward, Delice Bison, DO 01/17/20 0715

## 2020-01-18 ENCOUNTER — Emergency Department (HOSPITAL_COMMUNITY)
Admission: EM | Admit: 2020-01-18 | Discharge: 2020-01-18 | Disposition: A | Discharge status: Home / Self Care | Payer: Medicare Other | Attending: Emergency Medicine | Admitting: Emergency Medicine

## 2020-01-18 ENCOUNTER — Other Ambulatory Visit: Payer: Self-pay

## 2020-01-18 DIAGNOSIS — Z5321 Procedure and treatment not carried out due to patient leaving prior to being seen by health care provider: Secondary | ICD-10-CM | POA: Diagnosis not present

## 2020-01-18 DIAGNOSIS — M25562 Pain in left knee: Secondary | ICD-10-CM | POA: Insufficient documentation

## 2020-01-18 NOTE — ED Notes (Signed)
Pt on phone in lobby, says he has to get his cell phone; states he no longer wants to be evaluated

## 2020-01-18 NOTE — ED Notes (Signed)
No answer for room 

## 2020-01-18 NOTE — ED Notes (Signed)
Pt stated he has an appointment to get to so he is leaving

## 2020-01-18 NOTE — ED Triage Notes (Signed)
Pt said painful left knee. No swelling

## 2020-01-19 ENCOUNTER — Emergency Department (HOSPITAL_COMMUNITY)
Admission: EM | Admit: 2020-01-19 | Discharge: 2020-01-19 | Disposition: A | Discharge status: Home / Self Care | Payer: Medicare Other | Source: Home / Self Care | Attending: Emergency Medicine | Admitting: Emergency Medicine

## 2020-01-19 ENCOUNTER — Encounter (HOSPITAL_COMMUNITY): Payer: Self-pay | Admitting: Emergency Medicine

## 2020-01-19 ENCOUNTER — Other Ambulatory Visit: Payer: Self-pay

## 2020-01-19 ENCOUNTER — Emergency Department (HOSPITAL_COMMUNITY)
Admission: EM | Admit: 2020-01-19 | Discharge: 2020-01-19 | Disposition: A | Discharge status: Home / Self Care | Payer: Medicare Other | Attending: Emergency Medicine | Admitting: Emergency Medicine

## 2020-01-19 DIAGNOSIS — Z79899 Other long term (current) drug therapy: Secondary | ICD-10-CM | POA: Insufficient documentation

## 2020-01-19 DIAGNOSIS — Z7982 Long term (current) use of aspirin: Secondary | ICD-10-CM | POA: Insufficient documentation

## 2020-01-19 DIAGNOSIS — I110 Hypertensive heart disease with heart failure: Secondary | ICD-10-CM | POA: Insufficient documentation

## 2020-01-19 DIAGNOSIS — G8929 Other chronic pain: Secondary | ICD-10-CM | POA: Insufficient documentation

## 2020-01-19 DIAGNOSIS — M25562 Pain in left knee: Secondary | ICD-10-CM

## 2020-01-19 DIAGNOSIS — I5030 Unspecified diastolic (congestive) heart failure: Secondary | ICD-10-CM | POA: Diagnosis not present

## 2020-01-19 DIAGNOSIS — F17210 Nicotine dependence, cigarettes, uncomplicated: Secondary | ICD-10-CM | POA: Diagnosis not present

## 2020-01-19 MED ORDER — LIDOCAINE 5 % EX PTCH
1.0000 | MEDICATED_PATCH | CUTANEOUS | Status: DC
  Administered 2020-01-19: 1 via TRANSDERMAL
  Filled 2020-01-19: qty 1

## 2020-01-19 MED ORDER — ACETAMINOPHEN 325 MG PO TABS
650.0000 mg | ORAL_TABLET | Freq: Once | ORAL | Status: AC
  Administered 2020-01-19: 650 mg via ORAL
  Filled 2020-01-19: qty 2

## 2020-01-19 NOTE — ED Notes (Signed)
Patient verbalizes understanding of discharge instructions. Opportunity for questioning and answers were provided. Armband removed by staff, pt discharged from ED. Pt. ambulatory and discharged home.  

## 2020-01-19 NOTE — Discharge Instructions (Addendum)
1. Medications: use lidocaine patches and tylenol for pain control - DO NOT exceed 4g in 24 hours, usual home medications 2. Treatment: rest, ice, elevate and use brace, drink plenty of fluids, gentle stretching 3. Follow Up: Please followup with your PCP in 1 week if no improvement for discussion of your diagnoses and further evaluation after today's visit; if you do not have a primary care doctor use the resource guide provided to find one; Please return to the ER for worsening symptoms or other concerns

## 2020-01-19 NOTE — ED Triage Notes (Signed)
Left knee pain for a while with no relief, ETOH on board

## 2020-01-19 NOTE — ED Triage Notes (Signed)
Patient reports left knee pain onset today , denies injury , ambulatory .

## 2020-01-19 NOTE — ED Provider Notes (Signed)
[facility] EMERGENCY DEPARTMENT Provider Note   CSN: [id number] Arrival date & time: 01/19/20  0015     History Chief Complaint  Patient presents with  . Knee Pain    [name] is a 69 y.o. male.  The history is provided by the patient.  Knee Pain Location:  Knee Injury: no   Knee location:  L knee Pain details:    Quality:  Aching   Radiates to:  Does not radiate   Severity:  Moderate   Onset quality:  Gradual   Timing:  Constant   Progression:  Unchanged Chronicity:  Chronic Dislocation: no   Foreign body present:  No foreign bodies Prior injury to area:  No Relieved by:  Nothing Worsened by:  Nothing Ineffective treatments:  None tried Associated symptoms: no back pain, no decreased ROM, no fatigue, no fever, no muscle weakness, no neck pain, no swelling and no tingling   Risk factors: no concern for non-accidental trauma and no obesity        Past Medical History:  Diagnosis Date  . BPH (benign prostatic hyperplasia)   . Colon polyp 2010  . Depression   . GERD (gastroesophageal reflux disease)   . Hepatitis C    "caught it when I had a blood transfusion"  . High cholesterol   . History of blood transfusion    "when I was young"  . Hypertension   . Paranoid schizophrenia ([place])   . Prostate atrophy   . Retinal vein occlusion     Patient Active Problem List   Diagnosis Date Noted  . GERD (gastroesophageal reflux disease) 05/03/2019  . Hypotension 08/02/2018  . Low back sprain, initial encounter 10/21/2017  . History of drug abuse ([place]) 10/18/2017  . Gingival erythema 09/23/2017  . Chronic pain of left knee 11/24/2016  . Tobacco abuse   . Diastolic heart failure ([place]) 02/05/2015  . Healthcare maintenance 05/28/2014  . Hyperlipidemia 03/10/2014  . Patellar tendonitis 11/16/2012  . [place] OCCLUSION 06/03/2010  . PARANOID SCHIZOPHRENIA, CHRONIC 01/17/2009  . HYPERTENSION, BENIGN ESSENTIAL 01/17/2009  . BPH  (benign prostatic hyperplasia) 01/17/2009  . HEPATITIS C 12/14/2008  . DEPRESSION 12/14/2008    Past Surgical History:  Procedure Laterality Date  . CIRCUMCISION  1959  . TONSILLECTOMY         Family History  Problem Relation Age of Onset  . Hypertension Mother   . Diabetes Mother   . Stroke Mother     Social History   Tobacco Use  . Smoking status: Current Every Day Smoker    Packs/day: 0.50    Years: 48.00    Pack years: 24.00    Types: Cigarettes  . Smokeless tobacco: Never Used  Substance Use Topics  . Alcohol use: Yes  . Drug use: No    Home Medications Prior to Admission medications   Medication Sig Start Date End Date Taking? Authorizing Provider  acetaminophen (TYLENOL) 500 MG tablet Take 1 tablet (500 mg total) by mouth every 6 (six) hours as needed. 04/23/18   [name], [name], PA-C  amLODipine (NORVASC) 10 MG tablet Take 1 tablet (10 mg total) by mouth daily. 04/13/18   [name], DO  aspirin 81 MG tablet Take 1 tablet (81 mg total) by mouth daily. 09/24/14   [name], MD  atorvastatin (LIPITOR) 40 MG tablet Take 1 tablet (40 mg total) by mouth daily. 08/15/19   [name], DO  benztropine (COGENTIN) 1 MG tablet  Take 1 tablet (1 mg total) by mouth 2 (two) times daily. 11/05/16   [name], MD  cetirizine (ZYRTEC ALLERGY) 10 MG tablet Take 1 tablet (10 mg total) by mouth daily. 12/09/19   [name], PA-C  famotidine (PEPCID) 20 MG tablet Take 1 tablet (20 mg total) by mouth 2 (two) times daily. 05/02/19   [name], DO  fluPHENAZine decanoate (PROLIXIN) 25 MG/ML injection Inject 25 mg into the muscle every 14 (fourteen) days. Last dose 09/07/12    [provider]  fluticasone (FLONASE) 50 MCG/ACT nasal spray Place 2 sprays into both nostrils daily. 12/11/19   Fatima Blank, MD  loratadine (CLARITIN) 10 MG tablet Take 1 tablet (10 mg total) by mouth daily. 12/28/19   [name], DO  losartan (COZAAR) 100 MG tablet Take  1 tablet (100 mg total) by mouth daily. 11/28/19   Tammi Klippel, Sherin, DO  mometasone (NASONEX) 50 MCG/ACT nasal spray Place 2 sprays into the nose daily. 12/29/19   [name], PA-C  Multiple Vitamin (MULTIVITAMIN WITH MINERALS) TABS tablet Take 1 tablet by mouth daily. 06/28/14   [name], MD  sodium chloride (OCEAN) 0.65 % SOLN nasal spray Place 1 spray into both nostrils as needed for congestion. 11/19/19   [name], PA-C  tamsulosin (FLOMAX) 0.4 MG CAPS capsule Take 2 capsules (0.8 mg total) by mouth daily. 11/27/17   [name], MD  tamsulosin (FLOMAX) 0.4 MG CAPS capsule TAKE 2 CAPSULE BY MOUTH DAILY 01/09/20   [name], DO  traMADol (ULTRAM) 50 MG tablet Take 1 tablet (50 mg total) by mouth every 6 (six) hours as needed. 07/04/18   [name], MD  triamcinolone cream (KENALOG) 0.1 % Apply 1 application topically 2 (two) times daily. For 5 days, then twice daily as needed for rash and itching. 01/03/15   [name], MD  vitamin C (ASCORBIC ACID) 500 MG tablet Take 500 mg by mouth daily.    [provider]    Allergies    Lisinopril, Penicillins, Amoxicillin, and Ibuprofen  Review of Systems   Review of Systems  Constitutional: Negative for fatigue and fever.  HENT: Negative for congestion.   Eyes: Negative for visual disturbance.  Respiratory: Negative for shortness of breath.   Gastrointestinal: Negative for abdominal pain.  Genitourinary: Negative for difficulty urinating.  Musculoskeletal: Positive for arthralgias. Negative for back pain, gait problem, joint swelling and neck pain.  Skin: Negative for rash.  Neurological: Negative for dizziness.  Psychiatric/Behavioral: Negative for agitation.  All other systems reviewed and are negative.   Physical Exam Updated Vital Signs BP 122/66 (BP Location: Right Arm)   Pulse 74   Temp 98.2 F (36.8 C) (Oral)   Resp 16   Ht 5\' 6"  (1.676 m)   Wt 65 kg   SpO2 100%   BMI 23.13  kg/m   Physical Exam Vitals and nursing note reviewed.  Constitutional:      General: He is not in acute distress.    Appearance: Normal appearance.  HENT:     Head: Normocephalic and atraumatic.     Nose: Nose normal.  Eyes:     Conjunctiva/sclera: Conjunctivae normal.     Pupils: Pupils are equal, round, and reactive to light.  Cardiovascular:     Rate and Rhythm: Normal rate and regular rhythm.     Pulses: Normal pulses.     Heart sounds: Normal heart sounds.  Pulmonary:     Effort: Pulmonary effort  is normal.     Breath sounds: Normal breath sounds.  Abdominal:     General: Abdomen is flat. Bowel sounds are normal.     Tenderness: There is no abdominal tenderness. There is no guarding.  Musculoskeletal:        General: Normal range of motion.     Cervical back: Normal range of motion and neck supple.  Skin:    General: Skin is warm and dry.     Capillary Refill: Capillary refill takes less than 2 seconds.  Neurological:     General: No focal deficit present.     Mental Status: He is alert and oriented to person, place, and time.     Deep Tendon Reflexes: Reflexes normal.  Psychiatric:        Mood and Affect: Mood normal.        Behavior: Behavior normal.     ED Results / Procedures / Treatments   Labs (all labs ordered are listed, but only abnormal results are displayed) Labs Reviewed - No data to display  EKG None  Radiology No results found.  Procedures Procedures (including critical care time)  Medications Ordered in ED Medications  lidocaine (LIDODERM) 5 % 1 patch (1 patch Transdermal Patch Applied 01/19/20 0446)    ED Course  I have reviewed the triage vital signs and the nursing notes.  Pertinent labs & imaging results that were available during my care of the patient were reviewed by me and considered in my medical decision making (see chart for details).    Issue is chronic in nature.  No indication for imaging at this time.  Patient is a  regular in the department and essentially just wants to sleep.   AIDIAN SALOMON was evaluated in Emergency Department on 01/19/2020 for the symptoms described in the history of present illness. He was evaluated in the context of the global COVID-19 pandemic, which necessitated consideration that the patient might be at risk for infection with the SARS-CoV-2 virus that causes COVID-19. Institutional protocols and algorithms that pertain to the evaluation of patients at risk for COVID-19 are in a state of rapid change based on information released by regulatory bodies including the CDC and federal and state organizations. These policies and algorithms were followed during the patient's care in the ED.  Final Clinical Impression(s) / ED Diagnoses Return for intractable cough, coughing up blood,fevers >100.4 unrelieved by medication, shortness of breath, intractable vomiting, chest pain, shortness of breath, weakness,numbness, changes in speech, facial asymmetry,abdominal pain, passing out,Inability to tolerate liquids or food, cough, altered mental status or any concerns. No signs of systemic illness or infection. The patient is nontoxic-appearing on exam and vital signs are within normal limits.   I have reviewed the triage vital signs and the nursing notes. Pertinent labs &imaging results that were available during my care of the patient were reviewed by me and considered in my medical decision making (see chart for details).After history, exam, and medical workup I feel the patient has beenappropriately medically screened and is safe for discharge home. Pertinent diagnoses were discussed with the patient. Patient was given return precautions.   Seaborn Nakama, MD 01/19/20 [id number]

## 2020-01-19 NOTE — ED Provider Notes (Signed)
[facility] EMERGENCY DEPARTMENT Provider Note   CSN: [id number] Arrival date & time: 01/19/20  1928     History Chief Complaint  Patient presents with  . Knee Pain    [name] is a 69 y.o. male with a hx of schizophrenia, hypertension, hyperlipidemia, hepatitis C, GERD, BPH, depression, who is well-known to the ED with 50 visits in the last 6 months presents to the Emergency Department complaining of gradual, persistent left knee pain that has been ongoing for "a long time."  Patient denies falls or trauma.  He has used lidocaine without improvement.  He has not attempted any other medications.  He reports walking makes his pain worse, however this is his usual knee pain.  Denies fevers, chills, swelling of his knee or leg.   The history is provided by the patient and medical records. No language interpreter was used.       Past Medical History:  Diagnosis Date  . BPH (benign prostatic hyperplasia)   . Colon polyp 2010  . Depression   . GERD (gastroesophageal reflux disease)   . Hepatitis C    "caught it when I had a blood transfusion"  . High cholesterol   . History of blood transfusion    "when I was young"  . Hypertension   . Paranoid schizophrenia ([place])   . Prostate atrophy   . Retinal vein occlusion     Patient Active Problem List   Diagnosis Date Noted  . GERD (gastroesophageal reflux disease) 05/03/2019  . Hypotension 08/02/2018  . Low back sprain, initial encounter 10/21/2017  . History of drug abuse ([place]) 10/18/2017  . Gingival erythema 09/23/2017  . Chronic pain of left knee 11/24/2016  . Tobacco abuse   . Diastolic heart failure ([place]) 02/05/2015  . Healthcare maintenance 05/28/2014  . Hyperlipidemia 03/10/2014  . Patellar tendonitis 11/16/2012  . [place] OCCLUSION 06/03/2010  . PARANOID SCHIZOPHRENIA, CHRONIC 01/17/2009  . HYPERTENSION, BENIGN ESSENTIAL 01/17/2009  . BPH (benign prostatic hyperplasia) 01/17/2009  .  HEPATITIS C 12/14/2008  . DEPRESSION 12/14/2008    Past Surgical History:  Procedure Laterality Date  . CIRCUMCISION  1959  . TONSILLECTOMY         Family History  Problem Relation Age of Onset  . Hypertension Mother   . Diabetes Mother   . Stroke Mother     Social History   Tobacco Use  . Smoking status: Current Every Day Smoker    Packs/day: 0.50    Years: 48.00    Pack years: 24.00    Types: Cigarettes  . Smokeless tobacco: Never Used  Substance Use Topics  . Alcohol use: Yes  . Drug use: No    Home Medications Prior to Admission medications   Medication Sig Start Date End Date Taking? Authorizing Provider  acetaminophen (TYLENOL) 500 MG tablet Take 1 tablet (500 mg total) by mouth every 6 (six) hours as needed. 04/23/18   [name], [name], PA-C  amLODipine (NORVASC) 10 MG tablet Take 1 tablet (10 mg total) by mouth daily. 04/13/18   [name], DO  aspirin 81 MG tablet Take 1 tablet (81 mg total) by mouth daily. 09/24/14   [name], MD  atorvastatin (LIPITOR) 40 MG tablet Take 1 tablet (40 mg total) by mouth daily. 08/15/19   [name], DO  benztropine (COGENTIN) 1 MG tablet Take 1 tablet (1 mg total) by mouth 2 (two) times daily. 11/05/16   [name], MD  cetirizine (  ZYRTEC ALLERGY) 10 MG tablet Take 1 tablet (10 mg total) by mouth daily. 12/09/19   [name], PA-C  famotidine (PEPCID) 20 MG tablet Take 1 tablet (20 mg total) by mouth 2 (two) times daily. 05/02/19   [name], DO  fluPHENAZine decanoate (PROLIXIN) 25 MG/ML injection Inject 25 mg into the muscle every 14 (fourteen) days. Last dose 09/07/12    [provider]  fluticasone (FLONASE) 50 MCG/ACT nasal spray Place 2 sprays into both nostrils daily. 12/11/19   Fatima Blank, MD  loratadine (CLARITIN) 10 MG tablet Take 1 tablet (10 mg total) by mouth daily. 12/28/19   [name], DO  losartan (COZAAR) 100 MG tablet Take 1 tablet (100 mg total) by mouth daily.  11/28/19   Tammi Klippel, Sherin, DO  mometasone (NASONEX) 50 MCG/ACT nasal spray Place 2 sprays into the nose daily. 12/29/19   [name], PA-C  Multiple Vitamin (MULTIVITAMIN WITH MINERALS) TABS tablet Take 1 tablet by mouth daily. 06/28/14   [name], MD  sodium chloride (OCEAN) 0.65 % SOLN nasal spray Place 1 spray into both nostrils as needed for congestion. 11/19/19   [name], PA-C  tamsulosin (FLOMAX) 0.4 MG CAPS capsule Take 2 capsules (0.8 mg total) by mouth daily. 11/27/17   [name], MD  tamsulosin (FLOMAX) 0.4 MG CAPS capsule TAKE 2 CAPSULE BY MOUTH DAILY 01/09/20   [name], DO  traMADol (ULTRAM) 50 MG tablet Take 1 tablet (50 mg total) by mouth every 6 (six) hours as needed. 07/04/18   [name], MD  triamcinolone cream (KENALOG) 0.1 % Apply 1 application topically 2 (two) times daily. For 5 days, then twice daily as needed for rash and itching. 01/03/15   [name], MD  vitamin C (ASCORBIC ACID) 500 MG tablet Take 500 mg by mouth daily.    [provider]    Allergies    Lisinopril, Penicillins, Amoxicillin, and Ibuprofen  Review of Systems   Review of Systems  Constitutional: Negative for chills and fever.  Cardiovascular: Negative for leg swelling.  Musculoskeletal: Positive for arthralgias.    Physical Exam Updated Vital Signs BP (!) 154/90 (BP Location: Right Arm)   Pulse 70   Temp 98.8 F (37.1 C) (Oral)   Resp 18   Ht 5\' 9"  (1.753 m)   Wt 74.8 kg   SpO2 100%   BMI 24.35 kg/m   Physical Exam Vitals and nursing note reviewed.  Constitutional:      General: He is not in acute distress.    Appearance: He is well-developed.  HENT:     Head: Normocephalic.  Eyes:     General: No scleral icterus.    Conjunctiva/sclera: Conjunctivae normal.  Cardiovascular:     Rate and Rhythm: Normal rate.  Pulmonary:     Effort: Pulmonary effort is normal.  Musculoskeletal:        General: Normal range of motion.      Cervical back: Normal range of motion.     Right knee: Normal.     Left knee: No swelling, effusion or erythema. Normal range of motion. No tenderness.       Legs:  Skin:    General: Skin is warm and dry.  Neurological:     Mental Status: He is alert.     Comments: Sensation intact to normal touch in the left lower extremity.  Strength 5/5 at the ankle and knee.  Normal gait.     ED Results /  Procedures / Treatments    Procedures Procedures (including critical care time)  Medications Ordered in ED Medications  acetaminophen (TYLENOL) tablet 650 mg (has no administration in time range)    ED Course  I have reviewed the triage vital signs and the nursing notes.  Pertinent labs & imaging results that were available during my care of the patient were reviewed by me and considered in my medical decision making (see chart for details).    MDM Rules/Calculators/A&P                       Patient presents to the emergency department for chronic left knee pain.  He has been seen numerous times for this in the past.  No trauma or new injury.  No swelling, erythema or decreased range of motion to suggest septic joint.  Patient is ambulatory without difficulty.  Full range of motion.  Tylenol given and patient discharged in stable condition.  Final Clinical Impression(s) / ED Diagnoses Final diagnoses:  Chronic pain of left knee    Rx / DC Orders ED Discharge Orders    None       Soundra Lampley, Gwenlyn Perking 01/19/20 2301    Truddie Hidden, MD 01/20/20 [phone]

## 2020-01-21 ENCOUNTER — Other Ambulatory Visit: Payer: Self-pay

## 2020-01-21 ENCOUNTER — Emergency Department (HOSPITAL_COMMUNITY)
Admission: EM | Admit: 2020-01-21 | Discharge: 2020-01-22 | Disposition: A | Discharge status: Home / Self Care | Payer: Medicare Other | Source: Home / Self Care | Attending: Emergency Medicine | Admitting: Emergency Medicine

## 2020-01-21 ENCOUNTER — Encounter (HOSPITAL_COMMUNITY): Payer: Self-pay | Admitting: *Deleted

## 2020-01-21 ENCOUNTER — Emergency Department (HOSPITAL_COMMUNITY)
Admission: EM | Admit: 2020-01-21 | Discharge: 2020-01-21 | Disposition: A | Discharge status: Home / Self Care | Payer: Medicare Other | Attending: Emergency Medicine | Admitting: Emergency Medicine

## 2020-01-21 DIAGNOSIS — J3489 Other specified disorders of nose and nasal sinuses: Secondary | ICD-10-CM

## 2020-01-21 DIAGNOSIS — I10 Essential (primary) hypertension: Secondary | ICD-10-CM | POA: Diagnosis not present

## 2020-01-21 DIAGNOSIS — Z590 Homelessness: Secondary | ICD-10-CM | POA: Insufficient documentation

## 2020-01-21 DIAGNOSIS — T6591XA Toxic effect of unspecified substance, accidental (unintentional), initial encounter: Secondary | ICD-10-CM | POA: Insufficient documentation

## 2020-01-21 DIAGNOSIS — F200 Paranoid schizophrenia: Secondary | ICD-10-CM | POA: Insufficient documentation

## 2020-01-21 LAB — I-STAT ARTERIAL BLOOD GAS, ED
Acid-base deficit: 1 mmol/L (ref 0.0–2.0)
Bicarbonate: 24.3 mmol/L (ref 20.0–28.0)
Calcium, Ion: 1.19 mmol/L (ref 1.15–1.40)
HCT: 35 % — ABNORMAL LOW (ref 39.0–52.0)
Hemoglobin: 11.9 g/dL — ABNORMAL LOW (ref 13.0–17.0)
O2 Saturation: 97 %
Patient temperature: 98.4
Potassium: 2.8 mmol/L — ABNORMAL LOW (ref 3.5–5.1)
Sodium: 134 mmol/L — ABNORMAL LOW (ref 135–145)
TCO2: 26 mmol/L (ref 22–32)
pCO2 arterial: 41.2 mmHg (ref 32.0–48.0)
pH, Arterial: 7.377 (ref 7.350–7.450)
pO2, Arterial: 93 mmHg (ref 83.0–108.0)

## 2020-01-21 LAB — COMPREHENSIVE METABOLIC PANEL
ALT: 13 U/L (ref 0–44)
AST: 27 U/L (ref 15–41)
Albumin: 3.5 g/dL (ref 3.5–5.0)
Alkaline Phosphatase: 35 U/L — ABNORMAL LOW (ref 38–126)
Anion gap: 12 (ref 5–15)
BUN: 9 mg/dL (ref 8–23)
CO2: 25 mmol/L (ref 22–32)
Calcium: 8.1 mg/dL — ABNORMAL LOW (ref 8.9–10.3)
Chloride: 97 mmol/L — ABNORMAL LOW (ref 98–111)
Creatinine, Ser: 1.49 mg/dL — ABNORMAL HIGH (ref 0.61–1.24)
GFR calc Af Amer: 55 mL/min — ABNORMAL LOW (ref >60)
GFR calc non Af Amer: 48 mL/min — ABNORMAL LOW (ref >60)
Glucose, Bld: 113 mg/dL — ABNORMAL HIGH (ref 70–99)
Potassium: 3.1 mmol/L — ABNORMAL LOW (ref 3.5–5.1)
Sodium: 134 mmol/L — ABNORMAL LOW (ref 135–145)
Total Bilirubin: 0.6 mg/dL (ref 0.3–1.2)
Total Protein: 5.8 g/dL — ABNORMAL LOW (ref 6.5–8.1)

## 2020-01-21 LAB — CBC WITH DIFFERENTIAL/PLATELET
Abs Immature Granulocytes: 0.02 10*3/uL (ref 0.00–0.07)
Basophils Absolute: 0 10*3/uL (ref 0.0–0.1)
Basophils Relative: 0 %
Eosinophils Absolute: 0.2 10*3/uL (ref 0.0–0.5)
Eosinophils Relative: 4 %
HCT: 34.7 % — ABNORMAL LOW (ref 39.0–52.0)
Hemoglobin: 11.6 g/dL — ABNORMAL LOW (ref 13.0–17.0)
Immature Granulocytes: 0 %
Lymphocytes Relative: 30 %
Lymphs Abs: 1.4 10*3/uL (ref 0.7–4.0)
MCH: 29.8 pg (ref 26.0–34.0)
MCHC: 33.4 g/dL (ref 30.0–36.0)
MCV: 89.2 fL (ref 80.0–100.0)
Monocytes Absolute: 0.4 10*3/uL (ref 0.1–1.0)
Monocytes Relative: 10 %
Neutro Abs: 2.6 10*3/uL (ref 1.7–7.7)
Neutrophils Relative %: 56 %
Platelets: 104 10*3/uL — ABNORMAL LOW (ref 150–400)
RBC: 3.89 MIL/uL — ABNORMAL LOW (ref 4.22–5.81)
RDW: 15.1 % (ref 11.5–15.5)
WBC: 4.6 10*3/uL (ref 4.0–10.5)
nRBC: 0 % (ref 0.0–0.2)

## 2020-01-21 LAB — SALICYLATE LEVEL: Salicylate Lvl: <7 mg/dL — ABNORMAL LOW (ref 7.0–30.0)

## 2020-01-21 LAB — ACETAMINOPHEN LEVEL: Acetaminophen (Tylenol), Serum: <10 ug/mL — ABNORMAL LOW (ref 10–30)

## 2020-01-21 LAB — OSMOLALITY: Osmolality: 279 mOsm/kg (ref 275–295)

## 2020-01-21 LAB — ETHANOL: Alcohol, Ethyl (B): <10 mg/dL (ref <10)

## 2020-01-21 MED ORDER — POTASSIUM CHLORIDE CRYS ER 20 MEQ PO TBCR
40.0000 meq | EXTENDED_RELEASE_TABLET | Freq: Once | ORAL | Status: AC
  Administered 2020-01-21: 40 meq via ORAL
  Filled 2020-01-21: qty 2

## 2020-01-21 NOTE — ED Notes (Signed)
PT ambulating throughout ED.  States he wants to go home.

## 2020-01-21 NOTE — ED Triage Notes (Signed)
The pt arrived by [facility] ambulatory  He thinks he tasted  And swallowed some anti-freeze earlier tonight.  He comes in every night with some complaint  hes homeless and sleeps in the waiting room

## 2020-01-21 NOTE — ED Provider Notes (Signed)
Care assumed from [facility], PA-C, at shift change, please see their notes for full documentation of patient's complaint/HPI. Briefly, pt here with complaint of accidental ingestion of "antifreeze." Pt without any complaints, requesting a place to sleep. Given he has never come in complaining of this before labs were ordered. Awaiting labs. Plan is to dispo accordingly.   Physical Exam  BP 134/75 (BP Location: Right Arm)   Pulse 76   Temp 97.6 F (36.4 C) (Oral)   Resp 16   SpO2 100%   Physical Exam Vitals and nursing note reviewed.  Constitutional:      Appearance: He is not ill-appearing.  HENT:     Head: Normocephalic and atraumatic.  Eyes:     Conjunctiva/sclera: Conjunctivae normal.  Cardiovascular:     Rate and Rhythm: Normal rate and regular rhythm.  Pulmonary:     Effort: Pulmonary effort is normal.     Breath sounds: Normal breath sounds.  Skin:    General: Skin is warm and dry.     Coloration: Skin is not jaundiced.  Neurological:     Mental Status: He is alert.     ED Course/Procedures   Clinical Course as of Jan 20 1006  Sun Jan 21, 2020  0737 Normal pH.  pH, Arterial: 7.377 [HM]  0817 Potassium(!): 3.1 [MV]    Clinical Course User Index [HM] Muthersbaugh, Gwenlyn Perking [MV] Eustaquio Maize, PA-C    Procedures  Results for orders placed or performed during the hospital encounter of 01/21/20  Acetaminophen level  Result Value Ref Range   Acetaminophen (Tylenol), Serum <10 (L) 10 - 30 ug/mL  Salicylate level  Result Value Ref Range   Salicylate Lvl <7.6 (L) 7.0 - 30.0 mg/dL  CBC with Differential  Result Value Ref Range   WBC 4.6 4.0 - 10.5 K/uL   RBC 3.89 (L) 4.22 - 5.81 MIL/uL   Hemoglobin 11.6 (L) 13.0 - 17.0 g/dL   HCT 34.7 (L) 39.0 - 52.0 %   MCV 89.2 80.0 - 100.0 fL   MCH 29.8 26.0 - 34.0 pg   MCHC 33.4 30.0 - 36.0 g/dL   RDW 15.1 11.5 - 15.5 %   Platelets 104 (L) 150 - 400 K/uL   nRBC 0.0 0.0 - 0.2 %   Neutrophils Relative %  56 %   Neutro Abs 2.6 1.7 - 7.7 K/uL   Lymphocytes Relative 30 %   Lymphs Abs 1.4 0.7 - 4.0 K/uL   Monocytes Relative 10 %   Monocytes Absolute 0.4 0.1 - 1.0 K/uL   Eosinophils Relative 4 %   Eosinophils Absolute 0.2 0.0 - 0.5 K/uL   Basophils Relative 0 %   Basophils Absolute 0.0 0.0 - 0.1 K/uL   WBC Morphology MORPHOLOGY UNREMARKABLE    Immature Granulocytes 0 %   Abs Immature Granulocytes 0.02 0.00 - 0.07 K/uL  Comprehensive metabolic panel  Result Value Ref Range   Sodium 134 (L) 135 - 145 mmol/L   Potassium 3.1 (L) 3.5 - 5.1 mmol/L   Chloride 97 (L) 98 - 111 mmol/L   CO2 25 22 - 32 mmol/L   Glucose, Bld 113 (H) 70 - 99 mg/dL   BUN 9 8 - 23 mg/dL   Creatinine, Ser 1.49 (H) 0.61 - 1.24 mg/dL   Calcium 8.1 (L) 8.9 - 10.3 mg/dL   Total Protein 5.8 (L) 6.5 - 8.1 g/dL   Albumin 3.5 3.5 - 5.0 g/dL   AST 27 15 - 41 U/L   ALT  13 0 - 44 U/L   Alkaline Phosphatase 35 (L) 38 - 126 U/L   Total Bilirubin 0.6 0.3 - 1.2 mg/dL   GFR calc non Af Amer 48 (L) >60 mL/min   GFR calc Af Amer 55 (L) >60 mL/min   Anion gap 12 5 - 15  Ethanol  Result Value Ref Range   Alcohol, Ethyl (B) <10 <10 mg/dL  Osmolality  Result Value Ref Range   Osmolality 279 275 - 295 mOsm/kg  I-Stat arterial blood gas, ED  Result Value Ref Range   pH, Arterial 7.377 7.350 - 7.450   pCO2 arterial 41.2 32.0 - 48.0 mmHg   pO2, Arterial 93 83.0 - 108.0 mmHg   Bicarbonate 24.3 20.0 - 28.0 mmol/L   TCO2 26 22 - 32 mmol/L   O2 Saturation 97.0 %   Acid-base deficit 1.0 0.0 - 2.0 mmol/L   Sodium 134 (L) 135 - 145 mmol/L   Potassium 2.8 (L) 3.5 - 5.1 mmol/L   Calcium, Ion 1.19 1.15 - 1.40 mmol/L   HCT 35.0 (L) 39.0 - 52.0 %   Hemoglobin 11.9 (L) 13.0 - 17.0 g/dL   Patient temperature 98.4 F    Collection site Radial    Drawn by Operator    Sample type ARTERIAL     MDM  Labs with potassium 3.1, will provide with KDUR in the ED. Creatinine mildly elevated at 1.49 however appears to be around pt's baseline.    Lab Results  Component Value Date   CREATININE 1.49 (H) 01/21/2020   CREATININE 1.21 05/02/2019   CREATININE 1.66 (H) 04/13/2018   Remainder of labs unremarkable at this time. Pending ethylene glycol and blood volatile levels - have called lab and they report this will take 2-3 days as it is a send out lab. Given pt's well appearance and unchanged mental status I do not think he needs to board in the ED pending these labs. He is requesting to go home at this time. Will discharge. Have given info for [facility] for primary care needs. Pt instructed to return to the ED for any worsening symptoms     Eustaquio Maize, PA-C 01/21/20 [place], [place], DO 01/21/20 1539

## 2020-01-21 NOTE — ED Notes (Signed)
Patient refused lab draw for blood and EKG, MD notified

## 2020-01-21 NOTE — ED Provider Notes (Signed)
[facility] EMERGENCY DEPARTMENT Provider Note   CSN: [id number] Arrival date & time: 01/21/20  0121     History Chief Complaint  Patient presents with  . Ingestion    [name] is a 69 y.o. male with a hx of schizophrenia, hypertension, hyperlipidemia, hepatitis C, GERD, BPH, depression, who is well-known to the ED with 51 visits in the last 6 months  presents to the Emergency Department reporting that he believes he accidentally drank antifreeze.  He states he thought he was drinking juice but it tasted funny.  He is not sure how much he drank.  He does report it was several hours prior to arrival.  No aggravating or alleviating factors.  Patient denies fever, chills, headache, neck pain, chest pain, shortness of breath, abdominal pain, nausea, vomiting, diarrhea weakness, dizziness, syncope.  Patient denies homicidal or suicidal ideation.  He has never been seen for ingestion or suicide attempt in the past.   The history is provided by the patient and medical records. No language interpreter was used.       Past Medical History:  Diagnosis Date  . BPH (benign prostatic hyperplasia)   . Colon polyp 2010  . Depression   . GERD (gastroesophageal reflux disease)   . Hepatitis C    "caught it when I had a blood transfusion"  . High cholesterol   . History of blood transfusion    "when I was young"  . Hypertension   . Paranoid schizophrenia ([place])   . Prostate atrophy   . Retinal vein occlusion     Patient Active Problem List   Diagnosis Date Noted  . GERD (gastroesophageal reflux disease) 05/03/2019  . Hypotension 08/02/2018  . Low back sprain, initial encounter 10/21/2017  . History of drug abuse ([place]) 10/18/2017  . Gingival erythema 09/23/2017  . Chronic pain of left knee 11/24/2016  . Tobacco abuse   . Diastolic heart failure ([place]) 02/05/2015  . Healthcare maintenance 05/28/2014  . Hyperlipidemia 03/10/2014  . Patellar tendonitis 11/16/2012  .  [place] OCCLUSION 06/03/2010  . PARANOID SCHIZOPHRENIA, CHRONIC 01/17/2009  . HYPERTENSION, BENIGN ESSENTIAL 01/17/2009  . BPH (benign prostatic hyperplasia) 01/17/2009  . HEPATITIS C 12/14/2008  . DEPRESSION 12/14/2008    Past Surgical History:  Procedure Laterality Date  . CIRCUMCISION  1959  . TONSILLECTOMY         Family History  Problem Relation Age of Onset  . Hypertension Mother   . Diabetes Mother   . Stroke Mother     Social History   Tobacco Use  . Smoking status: Current Every Day Smoker    Packs/day: 0.50    Years: 48.00    Pack years: 24.00    Types: Cigarettes  . Smokeless tobacco: Never Used  Substance Use Topics  . Alcohol use: Yes  . Drug use: No    Home Medications Prior to Admission medications   Medication Sig Start Date End Date Taking? Authorizing Provider  acetaminophen (TYLENOL) 500 MG tablet Take 1 tablet (500 mg total) by mouth every 6 (six) hours as needed. 04/23/18   [name], [name], PA-C  amLODipine (NORVASC) 10 MG tablet Take 1 tablet (10 mg total) by mouth daily. 04/13/18   [name], DO  aspirin 81 MG tablet Take 1 tablet (81 mg total) by mouth daily. 09/24/14   [name], MD  atorvastatin (LIPITOR) 40 MG tablet Take 1 tablet (40 mg total) by mouth daily. 08/15/19   Tammi Klippel,  Sherin, DO  benztropine (COGENTIN) 1 MG tablet Take 1 tablet (1 mg total) by mouth 2 (two) times daily. 11/05/16   [name], MD  cetirizine (ZYRTEC ALLERGY) 10 MG tablet Take 1 tablet (10 mg total) by mouth daily. 12/09/19   [name], PA-C  famotidine (PEPCID) 20 MG tablet Take 1 tablet (20 mg total) by mouth 2 (two) times daily. 05/02/19   [name], DO  fluPHENAZine decanoate (PROLIXIN) 25 MG/ML injection Inject 25 mg into the muscle every 14 (fourteen) days. Last dose 09/07/12    [provider]  fluticasone (FLONASE) 50 MCG/ACT nasal spray Place 2 sprays into both nostrils daily. 12/11/19   Fatima Blank, MD    loratadine (CLARITIN) 10 MG tablet Take 1 tablet (10 mg total) by mouth daily. 12/28/19   [name], DO  losartan (COZAAR) 100 MG tablet Take 1 tablet (100 mg total) by mouth daily. 11/28/19   Tammi Klippel, Sherin, DO  mometasone (NASONEX) 50 MCG/ACT nasal spray Place 2 sprays into the nose daily. 12/29/19   [name], PA-C  Multiple Vitamin (MULTIVITAMIN WITH MINERALS) TABS tablet Take 1 tablet by mouth daily. 06/28/14   [name], MD  sodium chloride (OCEAN) 0.65 % SOLN nasal spray Place 1 spray into both nostrils as needed for congestion. 11/19/19   [name], PA-C  tamsulosin (FLOMAX) 0.4 MG CAPS capsule Take 2 capsules (0.8 mg total) by mouth daily. 11/27/17   [name], MD  tamsulosin (FLOMAX) 0.4 MG CAPS capsule TAKE 2 CAPSULE BY MOUTH DAILY 01/09/20   [name], DO  traMADol (ULTRAM) 50 MG tablet Take 1 tablet (50 mg total) by mouth every 6 (six) hours as needed. 07/04/18   [name], MD  triamcinolone cream (KENALOG) 0.1 % Apply 1 application topically 2 (two) times daily. For 5 days, then twice daily as needed for rash and itching. 01/03/15   [name], MD  vitamin C (ASCORBIC ACID) 500 MG tablet Take 500 mg by mouth daily.    [provider]    Allergies    Lisinopril, Penicillins, Amoxicillin, and Ibuprofen  Review of Systems   Review of Systems  Constitutional: Negative for appetite change, diaphoresis, fatigue, fever and unexpected weight change.  HENT: Negative for mouth sores.   Eyes: Negative for visual disturbance.  Respiratory: Negative for cough, chest tightness, shortness of breath and wheezing.   Cardiovascular: Negative for chest pain.  Gastrointestinal: Negative for abdominal pain, constipation, diarrhea, nausea and vomiting.  Endocrine: Negative for polydipsia, polyphagia and polyuria.  Genitourinary: Negative for dysuria, frequency, hematuria and urgency.  Musculoskeletal: Negative for back pain and neck  stiffness.  Skin: Negative for rash.  Allergic/Immunologic: Negative for immunocompromised state.  Neurological: Negative for syncope, light-headedness and headaches.  Hematological: Does not bruise/bleed easily.  Psychiatric/Behavioral: Negative for sleep disturbance. The patient is not nervous/anxious.     Physical Exam Updated Vital Signs BP 127/66 (BP Location: Right Arm)   Pulse 62   Temp 98.2 F (36.8 C) (Oral)   Resp 15   SpO2 100%   Physical Exam Vitals and nursing note reviewed.  Constitutional:      General: He is not in acute distress.    Appearance: He is not diaphoretic.  HENT:     Head: Normocephalic.  Eyes:     General: No scleral icterus.    Conjunctiva/sclera: Conjunctivae normal.  Cardiovascular:     Rate and Rhythm: Normal rate and regular rhythm.  Pulses: Normal pulses.          Radial pulses are 2+ on the right side and 2+ on the left side.  Pulmonary:     Effort: No tachypnea, accessory muscle usage, prolonged expiration, respiratory distress or retractions.     Breath sounds: No stridor.     Comments: Equal chest rise. No increased work of breathing. Abdominal:     General: There is no distension.     Palpations: Abdomen is soft.     Tenderness: There is no abdominal tenderness. There is no guarding or rebound.  Musculoskeletal:     Cervical back: Normal range of motion.     Comments: Moves all extremities equally and without difficulty.  Skin:    General: Skin is warm and dry.     Capillary Refill: Capillary refill takes less than 2 seconds.  Neurological:     Mental Status: He is alert.     GCS: GCS eye subscore is 4. GCS verbal subscore is 5. GCS motor subscore is 6.     Comments: Speech is clear and goal oriented.  Psychiatric:        Mood and Affect: Mood normal.     ED Results / Procedures / Treatments   Labs (all labs ordered are listed, but only abnormal results are displayed) Labs Reviewed  I-STAT ARTERIAL BLOOD GAS, ED -  Abnormal; Notable for the following components:      Result Value   Sodium 134 (*)    Potassium 2.8 (*)    HCT 35.0 (*)    Hemoglobin 11.9 (*)    All other components within normal limits  ACETAMINOPHEN LEVEL  SALICYLATE LEVEL  CBC WITH DIFFERENTIAL/PLATELET  COMPREHENSIVE METABOLIC PANEL  BLOOD GAS, ARTERIAL  ETHANOL  OSMOLALITY  VOLATILES,BLD-ACETONE,ETHANOL,ISOPROP,METHANOL  ETHYLENE GLYCOL    EKG EKG Interpretation  Date/Time:  Sunday January 21 2020 06:45:38 EDT Ventricular Rate:  62 PR Interval:  146 QRS Duration: 94 QT Interval:  384 QTC Calculation: 389 R Axis:   75 Text Interpretation: Normal sinus rhythm Nonspecific T wave abnormality Abnormal ECG unchanged lateral t waves Confirmed by Lennice Sites [phone]) on 01/21/2020 7:20:56 AM   Procedures Procedures (including critical care time)  Medications Ordered in ED Medications - No data to display  ED Course  I have reviewed the triage vital signs and the nursing notes.  Pertinent labs & imaging results that were available during my care of the patient were reviewed by me and considered in my medical decision making (see chart for details).  Clinical Course as of Jan 21 736  Sun Jan 21, 2020  0737 Normal pH.  pH, Arterial: 7.377 [HM]    Clinical Course User Index [HM] Olive Motyka, Gwenlyn Perking   MDM Rules/Calculators/A&P                      Patient presents with complaints of accidentally drinking antifreeze.  Unclear if this is the truth as patient often is malingering however he has never had this type of complaint before.  Denies SI, HI, AVH.  Lab work pending.  Patient well-appearing and at his baseline mental status.  He interacts and answers questions appropriately.  Vital signs are within normal limits.  Labs pending.  7:36 AM At shift change care was transferred to [facility] who will follow pending studies, re-evaulate and determine disposition.     Final Clinical Impression(s) / ED  Diagnoses Final diagnoses:  Accidental ingestion of substance, initial encounter  Rx / DC Orders ED Discharge Orders    None       Kemonte Ullman, Gwenlyn Perking 01/21/20 4536    Fatima Blank, MD 01/23/20 [phone]

## 2020-01-21 NOTE — Discharge Instructions (Addendum)
Follow up with [facility] for primary care needs Return to the ED for any worsening symptoms

## 2020-01-22 ENCOUNTER — Emergency Department (HOSPITAL_COMMUNITY)
Admission: EM | Admit: 2020-01-22 | Discharge: 2020-01-23 | Disposition: A | Discharge status: Home / Self Care | Payer: Medicare Other | Attending: Emergency Medicine | Admitting: Emergency Medicine

## 2020-01-22 ENCOUNTER — Encounter (HOSPITAL_COMMUNITY): Payer: Self-pay

## 2020-01-22 ENCOUNTER — Other Ambulatory Visit: Payer: Self-pay

## 2020-01-22 ENCOUNTER — Encounter (HOSPITAL_COMMUNITY): Payer: Self-pay | Admitting: Emergency Medicine

## 2020-01-22 DIAGNOSIS — F17210 Nicotine dependence, cigarettes, uncomplicated: Secondary | ICD-10-CM | POA: Insufficient documentation

## 2020-01-22 DIAGNOSIS — Z79899 Other long term (current) drug therapy: Secondary | ICD-10-CM | POA: Insufficient documentation

## 2020-01-22 DIAGNOSIS — R0981 Nasal congestion: Secondary | ICD-10-CM | POA: Diagnosis not present

## 2020-01-22 DIAGNOSIS — Z7982 Long term (current) use of aspirin: Secondary | ICD-10-CM | POA: Diagnosis not present

## 2020-01-22 LAB — ETHYLENE GLYCOL: Ethylene Glycol Lvl: <5 mg/dL

## 2020-01-22 MED ORDER — FLUTICASONE PROPIONATE 50 MCG/ACT NA SUSP
2.0000 | Freq: Every day | NASAL | 0 refills | Status: DC
Start: 2020-01-22 — End: 2020-02-01

## 2020-01-22 MED ORDER — FLUTICASONE PROPIONATE 50 MCG/ACT NA SUSP
1.0000 | Freq: Every day | NASAL | Status: DC
  Filled 2020-01-22: qty 16

## 2020-01-22 NOTE — ED Provider Notes (Signed)
[place] EMERGENCY DEPARTMENT Provider Note   CSN: [id number] Arrival date & time: 01/21/20  2340     History Chief Complaint  Patient presents with  . Facial Pain    [name] is a 69 y.o. male with a hx of schizophrenia, hypertension, hyperlipidemia, hepatitis C, GERD, BPH, depression, who is well-known to the ED with 52 visits in the last 6 months presents to the Emergency Department complaining of gradual, persistent, runny nose and sinus congestion.  He reports is been going on for "a while."  He denies fevers or chills, difficulty breathing, cough or shortness of breath.  When asked about utilizing his Flonase he reports that he has run out.  No other treatments prior to arrival.  No aggravating or alleviating symptoms.  The history is provided by the patient and medical records. No language interpreter was used.       Past Medical History:  Diagnosis Date  . BPH (benign prostatic hyperplasia)   . Colon polyp 2010  . Depression   . GERD (gastroesophageal reflux disease)   . Hepatitis C    "caught it when I had a blood transfusion"  . High cholesterol   . History of blood transfusion    "when I was young"  . Hypertension   . Paranoid schizophrenia ([place])   . Prostate atrophy   . Retinal vein occlusion     Patient Active Problem List   Diagnosis Date Noted  . GERD (gastroesophageal reflux disease) 05/03/2019  . Hypotension 08/02/2018  . Low back sprain, initial encounter 10/21/2017  . History of drug abuse ([place]) 10/18/2017  . Gingival erythema 09/23/2017  . Chronic pain of left knee 11/24/2016  . Tobacco abuse   . Diastolic heart failure ([place]) 02/05/2015  . Healthcare maintenance 05/28/2014  . Hyperlipidemia 03/10/2014  . Patellar tendonitis 11/16/2012  . [place] OCCLUSION 06/03/2010  . PARANOID SCHIZOPHRENIA, CHRONIC 01/17/2009  . HYPERTENSION, BENIGN ESSENTIAL 01/17/2009  . BPH (benign prostatic hyperplasia) 01/17/2009  .  HEPATITIS C 12/14/2008  . DEPRESSION 12/14/2008    Past Surgical History:  Procedure Laterality Date  . CIRCUMCISION  1959  . TONSILLECTOMY         Family History  Problem Relation Age of Onset  . Hypertension Mother   . Diabetes Mother   . Stroke Mother     Social History   Tobacco Use  . Smoking status: Current Every Day Smoker    Packs/day: 0.50    Years: 48.00    Pack years: 24.00    Types: Cigarettes  . Smokeless tobacco: Never Used  Substance Use Topics  . Alcohol use: Yes  . Drug use: No    Home Medications Prior to Admission medications   Medication Sig Start Date End Date Taking? Authorizing Provider  acetaminophen (TYLENOL) 500 MG tablet Take 1 tablet (500 mg total) by mouth every 6 (six) hours as needed. 04/23/18   [name], [name], PA-C  amLODipine (NORVASC) 10 MG tablet Take 1 tablet (10 mg total) by mouth daily. 04/13/18   [name], DO  aspirin 81 MG tablet Take 1 tablet (81 mg total) by mouth daily. 09/24/14   [name], MD  atorvastatin (LIPITOR) 40 MG tablet Take 1 tablet (40 mg total) by mouth daily. 08/15/19   [name], DO  benztropine (COGENTIN) 1 MG tablet Take 1 tablet (1 mg total) by mouth 2 (two) times daily. 11/05/16   [name], MD  cetirizine (ZYRTEC ALLERGY) 10  MG tablet Take 1 tablet (10 mg total) by mouth daily. 12/09/19   [name], PA-C  famotidine (PEPCID) 20 MG tablet Take 1 tablet (20 mg total) by mouth 2 (two) times daily. 05/02/19   [name], DO  fluPHENAZine decanoate (PROLIXIN) 25 MG/ML injection Inject 25 mg into the muscle every 14 (fourteen) days. Last dose 09/07/12    [provider]  fluticasone (FLONASE) 50 MCG/ACT nasal spray Place 2 sprays into both nostrils daily. 01/22/20   Deloras Reichard, Jarrett Soho, PA-C  loratadine (CLARITIN) 10 MG tablet Take 1 tablet (10 mg total) by mouth daily. 12/28/19   [name], DO  losartan (COZAAR) 100 MG tablet Take 1 tablet (100 mg total) by mouth daily.  11/28/19   Tammi Klippel, Sherin, DO  mometasone (NASONEX) 50 MCG/ACT nasal spray Place 2 sprays into the nose daily. 12/29/19   [name], PA-C  Multiple Vitamin (MULTIVITAMIN WITH MINERALS) TABS tablet Take 1 tablet by mouth daily. 06/28/14   [name], MD  sodium chloride (OCEAN) 0.65 % SOLN nasal spray Place 1 spray into both nostrils as needed for congestion. 11/19/19   [name], PA-C  tamsulosin (FLOMAX) 0.4 MG CAPS capsule Take 2 capsules (0.8 mg total) by mouth daily. 11/27/17   [name], MD  tamsulosin (FLOMAX) 0.4 MG CAPS capsule TAKE 2 CAPSULE BY MOUTH DAILY 01/09/20   [name], DO  traMADol (ULTRAM) 50 MG tablet Take 1 tablet (50 mg total) by mouth every 6 (six) hours as needed. 07/04/18   [name], MD  triamcinolone cream (KENALOG) 0.1 % Apply 1 application topically 2 (two) times daily. For 5 days, then twice daily as needed for rash and itching. 01/03/15   [name], MD  vitamin C (ASCORBIC ACID) 500 MG tablet Take 500 mg by mouth daily.    [provider]    Allergies    Lisinopril, Penicillins, Amoxicillin, and Ibuprofen  Review of Systems   Review of Systems  Constitutional: Negative for chills and fever.  HENT: Positive for congestion. Negative for facial swelling.   Respiratory: Negative for chest tightness and shortness of breath.   Cardiovascular: Negative for chest pain.  Neurological: Negative for headaches.    Physical Exam Updated Vital Signs BP (!) 156/93 (BP Location: Left Arm)   Pulse 66   Temp 98.6 F (37 C) (Oral)   Resp 16   SpO2 100%   Physical Exam Vitals and nursing note reviewed.  Constitutional:      General: He is not in acute distress.    Appearance: He is well-developed. He is not diaphoretic.  HENT:     Head: Normocephalic and atraumatic.     Right Ear: Tympanic membrane, ear canal and external ear normal.     Left Ear: Tympanic membrane, ear canal and external ear normal.      Nose: Mucosal edema present. No rhinorrhea.     Right Sinus: No maxillary sinus tenderness or frontal sinus tenderness.     Left Sinus: No maxillary sinus tenderness or frontal sinus tenderness.     Mouth/Throat:     Mouth: Mucous membranes are not pale and not cyanotic.     Pharynx: Uvula midline. No oropharyngeal exudate or posterior oropharyngeal erythema.     Tonsils: No tonsillar abscesses.  Eyes:     Conjunctiva/sclera: Conjunctivae normal.  Cardiovascular:     Rate and Rhythm: Normal rate.  Pulmonary:     Effort: Pulmonary effort is normal.  Breath sounds: No stridor.  Abdominal:     General: There is no distension.     Palpations: Abdomen is soft.     Tenderness: There is no abdominal tenderness.  Musculoskeletal:     Cervical back: Full passive range of motion without pain and normal range of motion.  Lymphadenopathy:     Cervical: No cervical adenopathy.  Skin:    General: Skin is warm and dry.     Findings: No rash.  Neurological:     Mental Status: He is alert.  Psychiatric:        Mood and Affect: Mood normal.     ED Results / Procedures / Treatments    Procedures Procedures (including critical care time)  Medications Ordered in ED Medications  fluticasone (FLONASE) 50 MCG/ACT nasal spray 1 spray (has no administration in time range)    ED Course  I have reviewed the triage vital signs and the nursing notes.  Pertinent labs & imaging results that were available during my care of the patient were reviewed by me and considered in my medical decision making (see chart for details).    MDM Rules/Calculators/A&P                       Patient presents with complaints of congestion.  Suspect allergic rhinitis.  Exam reassuring.  Flonase given in the emergency department and home prescription refilled.  Discussed reasons to return to the emergency department and need for close primary care follow-up.   Final Clinical Impression(s) / ED Diagnoses Final  diagnoses:  Rhinorrhea    Rx / DC Orders ED Discharge Orders         Ordered    fluticasone (FLONASE) 50 MCG/ACT nasal spray  Daily     01/22/20 0437           Gordy Goar, Jarrett Soho, PA-C 01/22/20 0441    Ripley Fraise, MD 01/22/20 2308

## 2020-01-22 NOTE — ED Triage Notes (Signed)
Pt reports "my sinus hurts." No other complaints.

## 2020-01-22 NOTE — ED Triage Notes (Signed)
Pt states his "sinuses are acting up"

## 2020-01-22 NOTE — Discharge Instructions (Addendum)
1. Medications: flonase, usual home medications 2. Treatment: rest, drink plenty of fluids, take tylenol or ibuprofen for headache 3. Follow Up: Please followup with your primary doctor in 3 days for discussion of your diagnoses and further evaluation after today's visit; if you do not have a primary care doctor use the resource guide provided to find one; Return to the ER for high fevers, difficulty breathing or other concerning symptoms

## 2020-01-23 ENCOUNTER — Emergency Department (HOSPITAL_COMMUNITY)
Admission: EM | Admit: 2020-01-23 | Discharge: 2020-01-23 | Disposition: A | Discharge status: Home / Self Care | Payer: Medicare Other | Attending: Emergency Medicine | Admitting: Emergency Medicine

## 2020-01-23 ENCOUNTER — Other Ambulatory Visit: Payer: Self-pay

## 2020-01-23 ENCOUNTER — Encounter (HOSPITAL_COMMUNITY): Payer: Self-pay

## 2020-01-23 DIAGNOSIS — I10 Essential (primary) hypertension: Secondary | ICD-10-CM | POA: Insufficient documentation

## 2020-01-23 DIAGNOSIS — Z7982 Long term (current) use of aspirin: Secondary | ICD-10-CM | POA: Insufficient documentation

## 2020-01-23 DIAGNOSIS — Z79899 Other long term (current) drug therapy: Secondary | ICD-10-CM | POA: Insufficient documentation

## 2020-01-23 DIAGNOSIS — M25561 Pain in right knee: Secondary | ICD-10-CM | POA: Diagnosis not present

## 2020-01-23 DIAGNOSIS — F17210 Nicotine dependence, cigarettes, uncomplicated: Secondary | ICD-10-CM | POA: Diagnosis not present

## 2020-01-23 DIAGNOSIS — B182 Chronic viral hepatitis C: Secondary | ICD-10-CM | POA: Insufficient documentation

## 2020-01-23 DIAGNOSIS — M25562 Pain in left knee: Secondary | ICD-10-CM | POA: Insufficient documentation

## 2020-01-23 DIAGNOSIS — F200 Paranoid schizophrenia: Secondary | ICD-10-CM | POA: Diagnosis not present

## 2020-01-23 DIAGNOSIS — G8929 Other chronic pain: Secondary | ICD-10-CM | POA: Insufficient documentation

## 2020-01-23 DIAGNOSIS — R0981 Nasal congestion: Secondary | ICD-10-CM | POA: Insufficient documentation

## 2020-01-23 LAB — VOLATILES,BLD-ACETONE,ETHANOL,ISOPROP,METHANOL
Acetone, blood: <0.01 g/dL (ref 0.000–0.010)
Ethanol, blood: <0.01 g/dL (ref 0.000–0.010)
Isopropanol, blood: <0.01 g/dL (ref 0.000–0.010)
Methanol, blood: <0.01 g/dL (ref 0.000–0.010)

## 2020-01-23 NOTE — ED Provider Notes (Signed)
[facility] EMERGENCY DEPARTMENT Provider Note   CSN: [id number] Arrival date & time: 01/22/20  2043     History Chief Complaint  Patient presents with  . Nasal Congestion    [name] is a 69 y.o. male.  The history is provided by the patient.      Patient reports nasal congestion.  This is a chronic issue.  The course is stable.  Nothing worsens his symptoms Past Medical History:  Diagnosis Date  . BPH (benign prostatic hyperplasia)   . Colon polyp 2010  . Depression   . GERD (gastroesophageal reflux disease)   . Hepatitis C    "caught it when I had a blood transfusion"  . High cholesterol   . History of blood transfusion    "when I was young"  . Hypertension   . Paranoid schizophrenia ([place])   . Prostate atrophy   . Retinal vein occlusion     Patient Active Problem List   Diagnosis Date Noted  . GERD (gastroesophageal reflux disease) 05/03/2019  . Hypotension 08/02/2018  . Low back sprain, initial encounter 10/21/2017  . History of drug abuse ([place]) 10/18/2017  . Gingival erythema 09/23/2017  . Chronic pain of left knee 11/24/2016  . Tobacco abuse   . Diastolic heart failure ([place]) 02/05/2015  . Healthcare maintenance 05/28/2014  . Hyperlipidemia 03/10/2014  . Patellar tendonitis 11/16/2012  . No Name OCCLUSION 06/03/2010  . PARANOID SCHIZOPHRENIA, CHRONIC 01/17/2009  . HYPERTENSION, BENIGN ESSENTIAL 01/17/2009  . BPH (benign prostatic hyperplasia) 01/17/2009  . HEPATITIS C 12/14/2008  . DEPRESSION 12/14/2008    Past Surgical History:  Procedure Laterality Date  . CIRCUMCISION  1959  . TONSILLECTOMY         Family History  Problem Relation Age of Onset  . Hypertension Mother   . Diabetes Mother   . Stroke Mother     Social History   Tobacco Use  . Smoking status: Current Every Day Smoker    Packs/day: 0.50    Years: 48.00    Pack years: 24.00    Types: Cigarettes  . Smokeless tobacco: Never Used    Substance Use Topics  . Alcohol use: Yes  . Drug use: No    Home Medications Prior to Admission medications   Medication Sig Start Date End Date Taking? Authorizing Provider  acetaminophen (TYLENOL) 500 MG tablet Take 1 tablet (500 mg total) by mouth every 6 (six) hours as needed. 04/23/18   [name], [name], PA-C  amLODipine (NORVASC) 10 MG tablet Take 1 tablet (10 mg total) by mouth daily. 04/13/18   [name], DO  aspirin 81 MG tablet Take 1 tablet (81 mg total) by mouth daily. 09/24/14   [name], MD  atorvastatin (LIPITOR) 40 MG tablet Take 1 tablet (40 mg total) by mouth daily. 08/15/19   [name], DO  benztropine (COGENTIN) 1 MG tablet Take 1 tablet (1 mg total) by mouth 2 (two) times daily. 11/05/16   [name], MD  cetirizine (ZYRTEC ALLERGY) 10 MG tablet Take 1 tablet (10 mg total) by mouth daily. 12/09/19   [name], PA-C  famotidine (PEPCID) 20 MG tablet Take 1 tablet (20 mg total) by mouth 2 (two) times daily. 05/02/19   [name], DO  fluPHENAZine decanoate (PROLIXIN) 25 MG/ML injection Inject 25 mg into the muscle every 14 (fourteen) days. Last dose 09/07/12    [provider]  fluticasone (FLONASE) 50 MCG/ACT nasal spray Place 2 sprays  into both nostrils daily. 01/22/20   Muthersbaugh, Jarrett Soho, PA-C  loratadine (CLARITIN) 10 MG tablet Take 1 tablet (10 mg total) by mouth daily. 12/28/19   [name], DO  losartan (COZAAR) 100 MG tablet Take 1 tablet (100 mg total) by mouth daily. 11/28/19   Tammi Klippel, Sherin, DO  mometasone (NASONEX) 50 MCG/ACT nasal spray Place 2 sprays into the nose daily. 12/29/19   [name], PA-C  Multiple Vitamin (MULTIVITAMIN WITH MINERALS) TABS tablet Take 1 tablet by mouth daily. 06/28/14   [name], MD  sodium chloride (OCEAN) 0.65 % SOLN nasal spray Place 1 spray into both nostrils as needed for congestion. 11/19/19   [name], PA-C  tamsulosin (FLOMAX) 0.4 MG CAPS capsule Take 2 capsules (0.8  mg total) by mouth daily. 11/27/17   [name], MD  tamsulosin (FLOMAX) 0.4 MG CAPS capsule TAKE 2 CAPSULE BY MOUTH DAILY 01/09/20   [name], DO  traMADol (ULTRAM) 50 MG tablet Take 1 tablet (50 mg total) by mouth every 6 (six) hours as needed. 07/04/18   [name], MD  triamcinolone cream (KENALOG) 0.1 % Apply 1 application topically 2 (two) times daily. For 5 days, then twice daily as needed for rash and itching. 01/03/15   [name], MD  vitamin C (ASCORBIC ACID) 500 MG tablet Take 500 mg by mouth daily.    [provider]    Allergies    Lisinopril, Penicillins, Amoxicillin, and Ibuprofen  Review of Systems   Review of Systems  Constitutional: Negative for fever.  HENT: Positive for congestion.     Physical Exam Updated Vital Signs BP 129/82 (BP Location: Left Arm)   Pulse 88   Temp 98.1 F (36.7 C)   Resp 18   Ht 1.753 m (5\' 9" )   Wt 75 kg   SpO2 99%   BMI 24.42 kg/m   Physical Exam CONSTITUTIONAL: Disheveled, sleeping HEAD AND FACE: Normocephalic/atraumatic EYES: EOMI/PERRL ENMT: Mucous membranes moist.  No facial swelling NECK: supple no meningeal signs LUNGS:  no apparent distress NEURO: Pt is awake/alert, moves all extremitiesx4 EXTREMITIES:full ROM SKIN: warm, color normal  ED Results / Procedures / Treatments   Labs (all labs ordered are listed, but only abnormal results are displayed) Labs Reviewed - No data to display  EKG None  Radiology No results found.  Procedures Procedures   Medications Ordered in ED Medications - No data to display  ED Course  I have reviewed the triage vital signs and the nursing notes.     MDM Rules/Calculators/A&P                       Final Clinical Impression(s) / ED Diagnoses Final diagnoses:  Nasal congestion    Rx / DC Orders ED Discharge Orders    None       Ripley Fraise, MD 01/23/20 [phone]

## 2020-01-23 NOTE — Discharge Instructions (Signed)
Continue your flonase and your medicines   See your doctor   Return to ER if you have fever, unable to walk

## 2020-01-23 NOTE — ED Provider Notes (Signed)
[place] EMERGENCY DEPARTMENT Provider Note   CSN: [id number] Arrival date & time: 01/23/20  2018     History Chief Complaint  Patient presents with  . Sinus Pain    [name] is a 69 y.o. male history of hepatitis C, high cholesterol, schizophrenia here presenting with sinus congestion.  This is about his ninth visit this week for the same symptoms.  He also complains of knee pain as well and has similar symptoms several days ago had multiple imaging studies that are negative.  He mostly came in yesterday and had Flonase and was discharged.  At the same complaints and states that he is walking around all day and decided to come here at night.  The history is provided by the patient.       Past Medical History:  Diagnosis Date  . BPH (benign prostatic hyperplasia)   . Colon polyp 2010  . Depression   . GERD (gastroesophageal reflux disease)   . Hepatitis C    "caught it when I had a blood transfusion"  . High cholesterol   . History of blood transfusion    "when I was young"  . Hypertension   . Paranoid schizophrenia ([place])   . Prostate atrophy   . Retinal vein occlusion     Patient Active Problem List   Diagnosis Date Noted  . GERD (gastroesophageal reflux disease) 05/03/2019  . Hypotension 08/02/2018  . Low back sprain, initial encounter 10/21/2017  . History of drug abuse ([place]) 10/18/2017  . Gingival erythema 09/23/2017  . Chronic pain of left knee 11/24/2016  . Tobacco abuse   . Diastolic heart failure ([place]) 02/05/2015  . Healthcare maintenance 05/28/2014  . Hyperlipidemia 03/10/2014  . Patellar tendonitis 11/16/2012  . [place] OCCLUSION 06/03/2010  . PARANOID SCHIZOPHRENIA, CHRONIC 01/17/2009  . HYPERTENSION, BENIGN ESSENTIAL 01/17/2009  . BPH (benign prostatic hyperplasia) 01/17/2009  . HEPATITIS C 12/14/2008  . DEPRESSION 12/14/2008    Past Surgical History:  Procedure Laterality Date  . CIRCUMCISION  1959  .  TONSILLECTOMY         Family History  Problem Relation Age of Onset  . Hypertension Mother   . Diabetes Mother   . Stroke Mother     Social History   Tobacco Use  . Smoking status: Current Every Day Smoker    Packs/day: 0.50    Years: 48.00    Pack years: 24.00    Types: Cigarettes  . Smokeless tobacco: Never Used  Substance Use Topics  . Alcohol use: Yes  . Drug use: No    Home Medications Prior to Admission medications   Medication Sig Start Date End Date Taking? Authorizing Provider  acetaminophen (TYLENOL) 500 MG tablet Take 1 tablet (500 mg total) by mouth every 6 (six) hours as needed. 04/23/18   [name], [name], PA-C  amLODipine (NORVASC) 10 MG tablet Take 1 tablet (10 mg total) by mouth daily. 04/13/18   [name], DO  aspirin 81 MG tablet Take 1 tablet (81 mg total) by mouth daily. 09/24/14   [name], MD  atorvastatin (LIPITOR) 40 MG tablet Take 1 tablet (40 mg total) by mouth daily. 08/15/19   [name], DO  benztropine (COGENTIN) 1 MG tablet Take 1 tablet (1 mg total) by mouth 2 (two) times daily. 11/05/16   [name], MD  cetirizine (ZYRTEC ALLERGY) 10 MG tablet Take 1 tablet (10 mg total) by mouth daily. 12/09/19   [name],  PA-C  famotidine (PEPCID) 20 MG tablet Take 1 tablet (20 mg total) by mouth 2 (two) times daily. 05/02/19   [name], DO  fluPHENAZine decanoate (PROLIXIN) 25 MG/ML injection Inject 25 mg into the muscle every 14 (fourteen) days. Last dose 09/07/12    [provider]  fluticasone (FLONASE) 50 MCG/ACT nasal spray Place 2 sprays into both nostrils daily. 01/22/20   Muthersbaugh, Jarrett Soho, PA-C  loratadine (CLARITIN) 10 MG tablet Take 1 tablet (10 mg total) by mouth daily. 12/28/19   [name], DO  losartan (COZAAR) 100 MG tablet Take 1 tablet (100 mg total) by mouth daily. 11/28/19   Tammi Klippel, Sherin, DO  mometasone (NASONEX) 50 MCG/ACT nasal spray Place 2 sprays into the nose daily. 12/29/19   [name],  PA-C  Multiple Vitamin (MULTIVITAMIN WITH MINERALS) TABS tablet Take 1 tablet by mouth daily. 06/28/14   [name], MD  sodium chloride (OCEAN) 0.65 % SOLN nasal spray Place 1 spray into both nostrils as needed for congestion. 11/19/19   [name], PA-C  tamsulosin (FLOMAX) 0.4 MG CAPS capsule Take 2 capsules (0.8 mg total) by mouth daily. 11/27/17   [name], MD  tamsulosin (FLOMAX) 0.4 MG CAPS capsule TAKE 2 CAPSULE BY MOUTH DAILY 01/09/20   [name], DO  traMADol (ULTRAM) 50 MG tablet Take 1 tablet (50 mg total) by mouth every 6 (six) hours as needed. 07/04/18   [name], MD  triamcinolone cream (KENALOG) 0.1 % Apply 1 application topically 2 (two) times daily. For 5 days, then twice daily as needed for rash and itching. 01/03/15   [name], MD  vitamin C (ASCORBIC ACID) 500 MG tablet Take 500 mg by mouth daily.    [provider]    Allergies    Lisinopril, Penicillins, Amoxicillin, and Ibuprofen  Review of Systems   Review of Systems  HENT: Positive for congestion.   All other systems reviewed and are negative.   Physical Exam Updated Vital Signs BP (!) 180/102 (BP Location: Left Arm)   Pulse 67   Temp 98.3 F (36.8 C) (Oral)   Resp 16   SpO2 100%   Physical Exam Vitals and nursing note reviewed.  HENT:     Head: Normocephalic.     Nose: Nose normal.     Comments: No sinus tenderness     Mouth/Throat:     Mouth: Mucous membranes are moist.  Eyes:     Extraocular Movements: Extraocular movements intact.     Pupils: Pupils are equal, round, and reactive to light.  Cardiovascular:     Rate and Rhythm: Normal rate.     Pulses: Normal pulses.     Heart sounds: Normal heart sounds.  Pulmonary:     Effort: Pulmonary effort is normal.     Breath sounds: Normal breath sounds.  Abdominal:     General: Abdomen is flat.     Palpations: Abdomen is soft.  Musculoskeletal:        General: Normal range of motion.      Cervical back: Normal range of motion.     Comments: Nl ROM bilateral knee, ? Small L knee effusion on exam. Able to ambulate well   Skin:    General: Skin is warm.     Capillary Refill: Capillary refill takes less than 2 seconds.  Neurological:     General: No focal deficit present.     Mental Status: He is alert.  Psychiatric:  Mood and Affect: Mood normal.        Behavior: Behavior normal.     ED Results / Procedures / Treatments   Labs (all labs ordered are listed, but only abnormal results are displayed) Labs Reviewed - No data to display  EKG None  Radiology No results found.  Procedures Procedures (including critical care time)  Medications Ordered in ED Medications - No data to display  ED Course  I have reviewed the triage vital signs and the nursing notes.  Pertinent labs & imaging results that were available during my care of the patient were reviewed by me and considered in my medical decision making (see chart for details).    MDM Rules/Calculators/A&P                      NOLIN GRELL is a 69 y.o. male here with sinus congestion.  This is a recurrent problem and there is no acute issue right now.  He already has Flonase.  We will have him follow-up with primary care doctor.  Final Clinical Impression(s) / ED Diagnoses Final diagnoses:  None    Rx / DC Orders ED Discharge Orders    None       Drenda Freeze, MD 01/23/20 2217

## 2020-01-23 NOTE — ED Triage Notes (Signed)
Pt arrives for eval of sinus pain. Here fore same the last 3 days.

## 2020-01-25 ENCOUNTER — Other Ambulatory Visit: Payer: Self-pay

## 2020-01-25 ENCOUNTER — Encounter (HOSPITAL_COMMUNITY): Payer: Self-pay | Admitting: Emergency Medicine

## 2020-01-25 ENCOUNTER — Emergency Department (HOSPITAL_COMMUNITY)
Admission: EM | Admit: 2020-01-25 | Discharge: 2020-01-25 | Disposition: A | Discharge status: Home / Self Care | Payer: Medicare Other | Attending: Emergency Medicine | Admitting: Emergency Medicine

## 2020-01-25 DIAGNOSIS — Z7982 Long term (current) use of aspirin: Secondary | ICD-10-CM | POA: Diagnosis not present

## 2020-01-25 DIAGNOSIS — T6591XA Toxic effect of unspecified substance, accidental (unintentional), initial encounter: Secondary | ICD-10-CM

## 2020-01-25 DIAGNOSIS — F17210 Nicotine dependence, cigarettes, uncomplicated: Secondary | ICD-10-CM | POA: Diagnosis not present

## 2020-01-25 DIAGNOSIS — Z79899 Other long term (current) drug therapy: Secondary | ICD-10-CM | POA: Insufficient documentation

## 2020-01-25 DIAGNOSIS — T543X1A Toxic effect of corrosive alkalis and alkali-like substances, accidental (unintentional), initial encounter: Secondary | ICD-10-CM | POA: Diagnosis present

## 2020-01-25 DIAGNOSIS — I110 Hypertensive heart disease with heart failure: Secondary | ICD-10-CM | POA: Insufficient documentation

## 2020-01-25 DIAGNOSIS — I5030 Unspecified diastolic (congestive) heart failure: Secondary | ICD-10-CM | POA: Diagnosis not present
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
## 2020-01-26 ENCOUNTER — Other Ambulatory Visit: Payer: Self-pay

## 2020-01-26 ENCOUNTER — Emergency Department (HOSPITAL_COMMUNITY)
Admission: EM | Admit: 2020-01-26 | Discharge: 2020-01-26 | Disposition: A | Discharge status: Home / Self Care | Payer: Medicare Other | Attending: Emergency Medicine | Admitting: Emergency Medicine

## 2020-01-26 DIAGNOSIS — Z5321 Procedure and treatment not carried out due to patient leaving prior to being seen by health care provider: Secondary | ICD-10-CM | POA: Insufficient documentation

## 2020-01-26 DIAGNOSIS — M79609 Pain in unspecified limb: Secondary | ICD-10-CM | POA: Insufficient documentation

## 2020-01-26 NOTE — ED Triage Notes (Signed)
Per pt having sinus issues and leg pain

## 2020-01-26 NOTE — ED Notes (Signed)
Pt stated he was leaving and going to the bus stop after being triaged.
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
## 2020-01-26 NOTE — ED Notes (Signed)
Pt came back in and asked if his name had been called.

## 2020-01-27 ENCOUNTER — Encounter (HOSPITAL_COMMUNITY): Payer: Self-pay | Admitting: Emergency Medicine

## 2020-01-27 ENCOUNTER — Emergency Department (HOSPITAL_COMMUNITY)
Admission: EM | Admit: 2020-01-27 | Discharge: 2020-01-27 | Disposition: A | Discharge status: Home / Self Care | Payer: Medicare Other | Attending: Emergency Medicine | Admitting: Emergency Medicine

## 2020-01-27 ENCOUNTER — Emergency Department (HOSPITAL_COMMUNITY)
Admission: EM | Admit: 2020-01-27 | Discharge: 2020-01-28 | Disposition: A | Discharge status: Home / Self Care | Payer: Medicare Other | Source: Home / Self Care | Attending: Emergency Medicine | Admitting: Emergency Medicine

## 2020-01-27 ENCOUNTER — Other Ambulatory Visit: Payer: Self-pay

## 2020-01-27 DIAGNOSIS — F17210 Nicotine dependence, cigarettes, uncomplicated: Secondary | ICD-10-CM | POA: Diagnosis not present

## 2020-01-27 DIAGNOSIS — Z7982 Long term (current) use of aspirin: Secondary | ICD-10-CM | POA: Insufficient documentation

## 2020-01-27 DIAGNOSIS — I10 Essential (primary) hypertension: Secondary | ICD-10-CM | POA: Insufficient documentation

## 2020-01-27 DIAGNOSIS — Z79899 Other long term (current) drug therapy: Secondary | ICD-10-CM | POA: Insufficient documentation

## 2020-01-27 DIAGNOSIS — M79671 Pain in right foot: Secondary | ICD-10-CM | POA: Insufficient documentation

## 2020-01-27 DIAGNOSIS — M79673 Pain in unspecified foot: Secondary | ICD-10-CM

## 2020-01-27 DIAGNOSIS — M25572 Pain in left ankle and joints of left foot: Secondary | ICD-10-CM

## 2020-01-27 MED ORDER — AMLODIPINE BESYLATE 5 MG PO TABS
10.0000 mg | ORAL_TABLET | Freq: Once | ORAL | Status: AC
  Administered 2020-01-28: 10 mg via ORAL
  Filled 2020-01-27: qty 2

## 2020-01-27 MED ORDER — ACETAMINOPHEN 325 MG PO TABS
650.0000 mg | ORAL_TABLET | Freq: Once | ORAL | Status: AC
  Administered 2020-01-27: 650 mg via ORAL
  Filled 2020-01-27: qty 2

## 2020-01-27 NOTE — Discharge Instructions (Signed)
Thank you for allowing me to care for you today in the Emergency Department.   Stretch the muscles of your ankle to avoid pain.  You can take Tylenol as directed on the bottle for pain.  Is available over-the-counter.  Return to the emergency department if you develop fever, chills, if the foot gets red, hot to the touch, swollen, if you have thick, mucus-like drainage, become unable to walk, or develop other new, concerning symptoms.

## 2020-01-27 NOTE — ED Provider Notes (Signed)
[facility] EMERGENCY DEPARTMENT Provider Note   CSN: [id number] Arrival date & time: 01/27/20  2119     History Chief Complaint  Patient presents with  . Ankle Pain    [name] is a 69 y.o. male with history of paranoid schizophrenia, hypertension, hyperlipidemia, hepatitis C, GERD, BPH, depression who is well-known to the emergency department with 68 visits in the last 6 months presenting with a chief complaint of left ankle pain.  Patient reports left ankle pain that began sometime earlier today.  He does not recall any specific injuries.  He is unsure of exactly where his ankle is hurting.  History is inconsistent.  Upon checking into the emergency department, staff noted the patient ambulating to [employer].  Patient is drinking a sweet tea from [employer] at bedside without difficulty.  He denies any falls or injuries, left knee pain, numbness or weakness, redness, swelling, or warmth.  No treatment prior to arrival.  No headache, chest pain, abdominal pain.  Reports that he has not taken his home antihypertensives since this morning and thinks that he takes amlodipine.    The history is provided by the patient. No language interpreter was used.       Past Medical History:  Diagnosis Date  . BPH (benign prostatic hyperplasia)   . Colon polyp 2010  . Depression   . GERD (gastroesophageal reflux disease)   . Hepatitis C    "caught it when I had a blood transfusion"  . High cholesterol   . History of blood transfusion    "when I was young"  . Hypertension   . Paranoid schizophrenia ([place])   . Prostate atrophy   . Retinal vein occlusion     Patient Active Problem List   Diagnosis Date Noted  . GERD (gastroesophageal reflux disease) 05/03/2019  . Hypotension 08/02/2018  . Low back sprain, initial encounter 10/21/2017  . History of drug abuse ([place]) 10/18/2017  . Gingival erythema 09/23/2017  . Chronic pain of left knee 11/24/2016  . Tobacco  abuse   . Diastolic heart failure ([place]) 02/05/2015  . Healthcare maintenance 05/28/2014  . Hyperlipidemia 03/10/2014  . Patellar tendonitis 11/16/2012  . [place] OCCLUSION 06/03/2010  . PARANOID SCHIZOPHRENIA, CHRONIC 01/17/2009  . HYPERTENSION, BENIGN ESSENTIAL 01/17/2009  . BPH (benign prostatic hyperplasia) 01/17/2009  . HEPATITIS C 12/14/2008  . DEPRESSION 12/14/2008    Past Surgical History:  Procedure Laterality Date  . CIRCUMCISION  1959  . TONSILLECTOMY         Family History  Problem Relation Age of Onset  . Hypertension Mother   . Diabetes Mother   . Stroke Mother     Social History   Tobacco Use  . Smoking status: Current Every Day Smoker    Packs/day: 0.50    Years: 48.00    Pack years: 24.00    Types: Cigarettes  . Smokeless tobacco: Never Used  Substance Use Topics  . Alcohol use: Yes  . Drug use: No    Home Medications Prior to Admission medications   Medication Sig Start Date End Date Taking? Authorizing Provider  acetaminophen (TYLENOL) 500 MG tablet Take 1 tablet (500 mg total) by mouth every 6 (six) hours as needed. 04/23/18   [name], [name], PA-C  amLODipine (NORVASC) 10 MG tablet Take 1 tablet (10 mg total) by mouth daily. 04/13/18   [name], DO  aspirin 81 MG tablet Take 1 tablet (81 mg total) by mouth daily. 09/24/14  [name], MD  atorvastatin (LIPITOR) 40 MG tablet Take 1 tablet (40 mg total) by mouth daily. 08/15/19   [name], DO  benztropine (COGENTIN) 1 MG tablet Take 1 tablet (1 mg total) by mouth 2 (two) times daily. 11/05/16   [name], MD  cetirizine (ZYRTEC ALLERGY) 10 MG tablet Take 1 tablet (10 mg total) by mouth daily. 12/09/19   [name], PA-C  famotidine (PEPCID) 20 MG tablet Take 1 tablet (20 mg total) by mouth 2 (two) times daily. 05/02/19   [name], DO  fluPHENAZine decanoate (PROLIXIN) 25 MG/ML injection Inject 25 mg into the muscle every 14 (fourteen) days. Last dose  09/07/12    [provider]  fluticasone (FLONASE) 50 MCG/ACT nasal spray Place 2 sprays into both nostrils daily. 01/22/20   Muthersbaugh, Jarrett Soho, PA-C  loratadine (CLARITIN) 10 MG tablet Take 1 tablet (10 mg total) by mouth daily. 12/28/19   [name], DO  losartan (COZAAR) 100 MG tablet Take 1 tablet (100 mg total) by mouth daily. 11/28/19   Tammi Klippel, Sherin, DO  mometasone (NASONEX) 50 MCG/ACT nasal spray Place 2 sprays into the nose daily. 12/29/19   [name], PA-C  Multiple Vitamin (MULTIVITAMIN WITH MINERALS) TABS tablet Take 1 tablet by mouth daily. 06/28/14   [name], MD  sodium chloride (OCEAN) 0.65 % SOLN nasal spray Place 1 spray into both nostrils as needed for congestion. 11/19/19   [name], PA-C  tamsulosin (FLOMAX) 0.4 MG CAPS capsule Take 2 capsules (0.8 mg total) by mouth daily. 11/27/17   [name], MD  tamsulosin (FLOMAX) 0.4 MG CAPS capsule TAKE 2 CAPSULE BY MOUTH DAILY 01/09/20   [name], DO  traMADol (ULTRAM) 50 MG tablet Take 1 tablet (50 mg total) by mouth every 6 (six) hours as needed. 07/04/18   [name], MD  triamcinolone cream (KENALOG) 0.1 % Apply 1 application topically 2 (two) times daily. For 5 days, then twice daily as needed for rash and itching. 01/03/15   [name], MD  vitamin C (ASCORBIC ACID) 500 MG tablet Take 500 mg by mouth daily.    [provider]    Allergies    Lisinopril, Penicillins, Amoxicillin, and Ibuprofen  Review of Systems   Review of Systems  Constitutional: Negative for activity change, chills and fever.  HENT: Negative for congestion.   Respiratory: Negative for shortness of breath.   Cardiovascular: Negative for chest pain.  Gastrointestinal: Negative for abdominal pain.  Musculoskeletal: Positive for arthralgias and myalgias. Negative for back pain.  Skin: Positive for wound. Negative for color change and rash.  Neurological: Negative for weakness and numbness.     Physical Exam Updated Vital Signs BP (!) 168/83 (BP Location: Left Arm)   Pulse 60   Temp 97.7 F (36.5 C) (Oral)   Resp 16   Ht 5\' 7"  (1.702 m)   Wt 70 kg   SpO2 100%   BMI 24.17 kg/m   Physical Exam Vitals and nursing note reviewed.  Constitutional:      Appearance: He is well-developed.  HENT:     Head: Normocephalic.  Eyes:     Conjunctiva/sclera: Conjunctivae normal.  Cardiovascular:     Rate and Rhythm: Normal rate and regular rhythm.     Heart sounds: No murmur heard.   Pulmonary:     Effort: Pulmonary effort is normal.  Abdominal:     General: There is no distension.     Palpations: Abdomen  is soft.  Musculoskeletal:     Cervical back: Neck supple.     Comments: No tenderness to palpation to the left foot, ankle, or lower leg.  Full active and passive range of motion of the left ankle, knee, and hip.  No erythema, edema, or warmth.  No wounds.  Independently moves all digits of the bilateral feet.  DP PT pulses are 2+ and symmetric.  Good capillary refill of the digits.  Patient is noted to be standing at bedside on arrival.  He ambulated from the waiting room independently without an antalgic gait or ataxia.  Skin:    General: Skin is warm and dry.  Neurological:     Mental Status: He is alert.  Psychiatric:        Behavior: Behavior normal.     ED Results / Procedures / Treatments   Labs (all labs ordered are listed, but only abnormal results are displayed) Labs Reviewed - No data to display  EKG None  Radiology No results found.  Procedures Procedures (including critical care time)  Medications Ordered in ED Medications  amLODipine (NORVASC) tablet 10 mg (10 mg Oral Given 01/28/20 0028)    ED Course  I have reviewed the triage vital signs and the nursing notes.  Pertinent labs & imaging results that were available during my care of the patient were reviewed by me and considered in my medical decision making (see chart for details).      MDM Rules/Calculators/A&P                          69 year old male with history of paranoid schizophrenia, hypertension, hyperlipidemia, hepatitis C, GERD, BPH, depression presenting with vague complaints of left ankle pain that began sometime today.  He does not recall any specific injury.  No trauma or falls.  Staff notes that immediately after checking in that the patient ambulated independently out of the department and returned with a drink from [employer] that is located in the hospital.  Hypertensive, but vital signs are otherwise unremarkable.  We will get his nighttime dose of amlodipine.  He has no signs or symptoms of hypertensive urgency or emergency.  Since there is no tenderness, erythema, edema, or warmth and patient is ambulatory without difficulty, I feel that no further urgent or emergent work-up is indicated.  I have a low suspicion for fracture, gout, or septic joint.  Will recommend discharge to home with outpatient follow-up as needed.   Final Clinical Impression(s) / ED Diagnoses Final diagnoses:  Acute left ankle pain    Rx / DC Orders ED Discharge Orders    None       Joanne Gavel, PA-C 01/28/20 0513    Margette Fast, MD 01/30/20 1949

## 2020-01-27 NOTE — Discharge Instructions (Signed)
You were seen in the ER for foot pain.  Please take tylenol per over the counter dosing. Try to wear comfortable footwear. Please keep your feet clean & dry.   Follow up with primary care within 3 days. Return to the ED with new or worsening symptoms including but not limited to increased pain, fever, redness, numbness, or any other concerns.

## 2020-01-27 NOTE — ED Provider Notes (Signed)
[place] EMERGENCY DEPARTMENT Provider Note   CSN: [id number] Arrival date & time: 01/27/20  0051     History Chief Complaint  Patient presents with  . Foot Pain    [name] is a 69 y.o. male with a history of paranoid schizophrenia, hypertension, hypercholesterolemia, and drug use who presents to the emergency department with complaints of right foot pain to me, complained of left foot pain to triage.  Patient states pain started today, no alleviating aggravating factors, no intervention prior to arrival.  Denies traumatic injury.  Denies fever, chills, redness, numbness, tingling, or weakness.  HPI     Past Medical History:  Diagnosis Date  . BPH (benign prostatic hyperplasia)   . Colon polyp 2010  . Depression   . GERD (gastroesophageal reflux disease)   . Hepatitis C    "caught it when I had a blood transfusion"  . High cholesterol   . History of blood transfusion    "when I was young"  . Hypertension   . Paranoid schizophrenia ([place])   . Prostate atrophy   . Retinal vein occlusion     Patient Active Problem List   Diagnosis Date Noted  . GERD (gastroesophageal reflux disease) 05/03/2019  . Hypotension 08/02/2018  . Low back sprain, initial encounter 10/21/2017  . History of drug abuse ([place]) 10/18/2017  . Gingival erythema 09/23/2017  . Chronic pain of left knee 11/24/2016  . Tobacco abuse   . Diastolic heart failure ([place]) 02/05/2015  . Healthcare maintenance 05/28/2014  . Hyperlipidemia 03/10/2014  . Patellar tendonitis 11/16/2012  . [place] OCCLUSION 06/03/2010  . PARANOID SCHIZOPHRENIA, CHRONIC 01/17/2009  . HYPERTENSION, BENIGN ESSENTIAL 01/17/2009  . BPH (benign prostatic hyperplasia) 01/17/2009  . HEPATITIS C 12/14/2008  . DEPRESSION 12/14/2008    Past Surgical History:  Procedure Laterality Date  . CIRCUMCISION  1959  . TONSILLECTOMY         Family History  Problem Relation Age of Onset  . Hypertension  Mother   . Diabetes Mother   . Stroke Mother     Social History   Tobacco Use  . Smoking status: Current Every Day Smoker    Packs/day: 0.50    Years: 48.00    Pack years: 24.00    Types: Cigarettes  . Smokeless tobacco: Never Used  Substance Use Topics  . Alcohol use: Yes  . Drug use: No    Home Medications Prior to Admission medications   Medication Sig Start Date End Date Taking? Authorizing Provider  acetaminophen (TYLENOL) 500 MG tablet Take 1 tablet (500 mg total) by mouth every 6 (six) hours as needed. 04/23/18   [name], [name], PA-C  amLODipine (NORVASC) 10 MG tablet Take 1 tablet (10 mg total) by mouth daily. 04/13/18   [name], DO  aspirin 81 MG tablet Take 1 tablet (81 mg total) by mouth daily. 09/24/14   [name], MD  atorvastatin (LIPITOR) 40 MG tablet Take 1 tablet (40 mg total) by mouth daily. 08/15/19   [name], DO  benztropine (COGENTIN) 1 MG tablet Take 1 tablet (1 mg total) by mouth 2 (two) times daily. 11/05/16   [name], MD  cetirizine (ZYRTEC ALLERGY) 10 MG tablet Take 1 tablet (10 mg total) by mouth daily. 12/09/19   [name], PA-C  famotidine (PEPCID) 20 MG tablet Take 1 tablet (20 mg total) by mouth 2 (two) times daily. 05/02/19   [name], DO  fluPHENAZine decanoate (PROLIXIN)  25 MG/ML injection Inject 25 mg into the muscle every 14 (fourteen) days. Last dose 09/07/12    [provider]  fluticasone (FLONASE) 50 MCG/ACT nasal spray Place 2 sprays into both nostrils daily. 01/22/20   Muthersbaugh, Jarrett Soho, PA-C  loratadine (CLARITIN) 10 MG tablet Take 1 tablet (10 mg total) by mouth daily. 12/28/19   [name], DO  losartan (COZAAR) 100 MG tablet Take 1 tablet (100 mg total) by mouth daily. 11/28/19   Tammi Klippel, Sherin, DO  mometasone (NASONEX) 50 MCG/ACT nasal spray Place 2 sprays into the nose daily. 12/29/19   [name], PA-C  Multiple Vitamin (MULTIVITAMIN WITH MINERALS) TABS tablet Take 1 tablet by mouth  daily. 06/28/14   [name], MD  sodium chloride (OCEAN) 0.65 % SOLN nasal spray Place 1 spray into both nostrils as needed for congestion. 11/19/19   [name], PA-C  tamsulosin (FLOMAX) 0.4 MG CAPS capsule Take 2 capsules (0.8 mg total) by mouth daily. 11/27/17   [name], MD  tamsulosin (FLOMAX) 0.4 MG CAPS capsule TAKE 2 CAPSULE BY MOUTH DAILY 01/09/20   [name], DO  traMADol (ULTRAM) 50 MG tablet Take 1 tablet (50 mg total) by mouth every 6 (six) hours as needed. 07/04/18   [name], MD  triamcinolone cream (KENALOG) 0.1 % Apply 1 application topically 2 (two) times daily. For 5 days, then twice daily as needed for rash and itching. 01/03/15   [name], MD  vitamin C (ASCORBIC ACID) 500 MG tablet Take 500 mg by mouth daily.    [provider]    Allergies    Lisinopril, Penicillins, Amoxicillin, and Ibuprofen  Review of Systems   Review of Systems  Constitutional: Negative for chills and fever.  Respiratory: Negative for shortness of breath.   Cardiovascular: Negative for chest pain.  Musculoskeletal: Positive for arthralgias.  Skin: Negative for color change, pallor, rash and wound.  Neurological: Negative for weakness and numbness.    Physical Exam Updated Vital Signs BP (!) 150/60   Pulse 88   Temp (!) 97.5 F (36.4 C) (Oral)   Resp 17   SpO2 98%   Physical Exam Vitals and nursing note reviewed.  Constitutional:      General: He is not in acute distress.    Appearance: He is not ill-appearing or toxic-appearing.  HENT:     Head: Normocephalic and atraumatic.  Cardiovascular:     Pulses:          Dorsalis pedis pulses are 2+ on the right side and 2+ on the left side.       Posterior tibial pulses are 2+ on the right side and 2+ on the left side.  Pulmonary:     Effort: Pulmonary effort is normal.  Musculoskeletal:     Comments: Lower extremities: Patient has a callus to the lateral aspect of his right heel  area.  There is no erythema, warmth, or purulent drainage.  No fluctuance.  He has trace symmetric pitting edema to the bilateral lower legs. Sole of the left foot is somewhat moist.  No bruising or significant open wounds.  Intact active range of motion throughout.  No focal bony tenderness.  Neurovascularly intact distally.  Skin:    General: Skin is warm and dry.     Capillary Refill: Capillary refill takes less than 2 seconds.  Neurological:     Mental Status: He is alert.     Comments: Alert. Clear speech. Sensation grossly intact to  bilateral lower extremities. 5/5 strength with plantar/dorsiflexion bilaterally. Patient ambulatory.   Psychiatric:        Mood and Affect: Mood normal.        Behavior: Behavior normal.     ED Results / Procedures / Treatments   Labs (all labs ordered are listed, but only abnormal results are displayed) Labs Reviewed - No data to display  EKG None  Radiology No results found.  Procedures Procedures (including critical care time)  Medications Ordered in ED Medications - No data to display  ED Course  I have reviewed the triage vital signs and the nursing notes.  Pertinent labs & imaging results that were available during my care of the patient were reviewed by me and considered in my medical decision making (see chart for details).    MDM Rules/Calculators/A&P                          Patient presents to the emergency department with complaints of right foot pain to me left foot pain to triage.  Nontoxic, resting comfortably, vitals with mildly elevated BP, low suspicion for hypertensive emergency.  No recent trauma, no focal bony tenderness, doubt fracture or dislocation.  Symmetric trace edema, no calf tenderness, doubt DVT.  Good symmetric pulses, not cool to the touch, doubt arterial emboli.  Patient does have a callused area noted to R foot and some moistness to sole of the R foot, however there are no significant areas of skin breakdown  or signs of infection at this time.  Frequent ED visits for multiple complaints.  Will give Tylenol and discharge at this time. I discussed treatment plan, need for follow-up, and return precautions with the patient. Provided opportunity for questions, patient confirmed understanding and is in agreement with plan.   Final Clinical Impression(s) / ED Diagnoses Final diagnoses:  Pain of foot, unspecified laterality    Rx / DC Orders ED Discharge Orders    None       Amaryllis Dyke, PA-C 01/27/20 0540    Orpah Greek, MD 01/27/20 [phone]

## 2020-01-27 NOTE — ED Triage Notes (Signed)
Patient reports left foot pain for 2 days , denies injury/ambulatory.

## 2020-01-27 NOTE — ED Notes (Signed)
Patient seen walking towards [employer] immediately after checking in

## 2020-01-27 NOTE — ED Triage Notes (Signed)
Patient endorses left ankle pain today , denies injury /ambultory .

## 2020-01-28 DIAGNOSIS — M79671 Pain in right foot: Secondary | ICD-10-CM | POA: Diagnosis not present

## 2020-01-29 ENCOUNTER — Emergency Department (HOSPITAL_COMMUNITY)
Admission: EM | Admit: 2020-01-29 | Discharge: 2020-01-29 | Disposition: A | Discharge status: Home / Self Care | Payer: Medicare Other | Attending: Emergency Medicine | Admitting: Emergency Medicine

## 2020-01-29 ENCOUNTER — Other Ambulatory Visit: Payer: Self-pay

## 2020-01-29 ENCOUNTER — Encounter (HOSPITAL_COMMUNITY): Payer: Self-pay | Admitting: *Deleted

## 2020-01-29 DIAGNOSIS — Z79899 Other long term (current) drug therapy: Secondary | ICD-10-CM | POA: Insufficient documentation

## 2020-01-29 DIAGNOSIS — I10 Essential (primary) hypertension: Secondary | ICD-10-CM | POA: Diagnosis not present

## 2020-01-29 DIAGNOSIS — F17210 Nicotine dependence, cigarettes, uncomplicated: Secondary | ICD-10-CM | POA: Diagnosis not present

## 2020-01-29 DIAGNOSIS — Z7982 Long term (current) use of aspirin: Secondary | ICD-10-CM | POA: Diagnosis not present

## 2020-01-29 DIAGNOSIS — R0981 Nasal congestion: Secondary | ICD-10-CM | POA: Insufficient documentation

## 2020-01-29 DIAGNOSIS — Z0000 Encounter for general adult medical examination without abnormal findings: Secondary | ICD-10-CM

## 2020-01-29 NOTE — Discharge Instructions (Signed)
Please go to see your doctor for your non-emergency complaints

## 2020-01-29 NOTE — ED Triage Notes (Signed)
The pt is here every night for some type complaint he usually sleeps

## 2020-01-29 NOTE — ED Provider Notes (Addendum)
[facility] EMERGENCY DEPARTMENT Provider Note   CSN: [id number] Arrival date & time: 01/29/20  0013     History Chief Complaint  Patient presents with  . no reason    [name] is a 69 y.o. male.  Patient to ED with vague complaints of sinus and nasal congestion. No fever. No pain, vomiting, cough.   The history is provided by the patient. No language interpreter was used.       Past Medical History:  Diagnosis Date  . BPH (benign prostatic hyperplasia)   . Colon polyp 2010  . Depression   . GERD (gastroesophageal reflux disease)   . Hepatitis C    "caught it when I had a blood transfusion"  . High cholesterol   . History of blood transfusion    "when I was young"  . Hypertension   . Paranoid schizophrenia ([place])   . Prostate atrophy   . Retinal vein occlusion     Patient Active Problem List   Diagnosis Date Noted  . GERD (gastroesophageal reflux disease) 05/03/2019  . Hypotension 08/02/2018  . Low back sprain, initial encounter 10/21/2017  . History of drug abuse ([place]) 10/18/2017  . Gingival erythema 09/23/2017  . Chronic pain of left knee 11/24/2016  . Tobacco abuse   . Diastolic heart failure ([place]) 02/05/2015  . Healthcare maintenance 05/28/2014  . Hyperlipidemia 03/10/2014  . Patellar tendonitis 11/16/2012  . [place] OCCLUSION 06/03/2010  . PARANOID SCHIZOPHRENIA, CHRONIC 01/17/2009  . HYPERTENSION, BENIGN ESSENTIAL 01/17/2009  . BPH (benign prostatic hyperplasia) 01/17/2009  . HEPATITIS C 12/14/2008  . DEPRESSION 12/14/2008    Past Surgical History:  Procedure Laterality Date  . CIRCUMCISION  1959  . TONSILLECTOMY         Family History  Problem Relation Age of Onset  . Hypertension Mother   . Diabetes Mother   . Stroke Mother     Social History   Tobacco Use  . Smoking status: Current Every Day Smoker    Packs/day: 0.50    Years: 48.00    Pack years: 24.00    Types: Cigarettes  . Smokeless  tobacco: Never Used  Substance Use Topics  . Alcohol use: Yes  . Drug use: No    Home Medications Prior to Admission medications   Medication Sig Start Date End Date Taking? Authorizing Provider  acetaminophen (TYLENOL) 500 MG tablet Take 1 tablet (500 mg total) by mouth every 6 (six) hours as needed. 04/23/18   [name], [name], PA-C  amLODipine (NORVASC) 10 MG tablet Take 1 tablet (10 mg total) by mouth daily. 04/13/18   [name], DO  aspirin 81 MG tablet Take 1 tablet (81 mg total) by mouth daily. 09/24/14   [name], MD  atorvastatin (LIPITOR) 40 MG tablet Take 1 tablet (40 mg total) by mouth daily. 08/15/19   [name], DO  benztropine (COGENTIN) 1 MG tablet Take 1 tablet (1 mg total) by mouth 2 (two) times daily. 11/05/16   [name], MD  cetirizine (ZYRTEC ALLERGY) 10 MG tablet Take 1 tablet (10 mg total) by mouth daily. 12/09/19   [name], PA-C  famotidine (PEPCID) 20 MG tablet Take 1 tablet (20 mg total) by mouth 2 (two) times daily. 05/02/19   [name], DO  fluPHENAZine decanoate (PROLIXIN) 25 MG/ML injection Inject 25 mg into the muscle every 14 (fourteen) days. Last dose 09/07/12    [provider]  fluticasone (FLONASE) 50 MCG/ACT nasal  spray Place 2 sprays into both nostrils daily. 01/22/20   Muthersbaugh, Jarrett Soho, PA-C  loratadine (CLARITIN) 10 MG tablet Take 1 tablet (10 mg total) by mouth daily. 12/28/19   [name], DO  losartan (COZAAR) 100 MG tablet Take 1 tablet (100 mg total) by mouth daily. 11/28/19   Tammi Klippel, Sherin, DO  mometasone (NASONEX) 50 MCG/ACT nasal spray Place 2 sprays into the nose daily. 12/29/19   [name], PA-C  Multiple Vitamin (MULTIVITAMIN WITH MINERALS) TABS tablet Take 1 tablet by mouth daily. 06/28/14   [name], MD  sodium chloride (OCEAN) 0.65 % SOLN nasal spray Place 1 spray into both nostrils as needed for congestion. 11/19/19   [name], PA-C  tamsulosin (FLOMAX) 0.4 MG CAPS capsule  Take 2 capsules (0.8 mg total) by mouth daily. 11/27/17   [name], MD  tamsulosin (FLOMAX) 0.4 MG CAPS capsule TAKE 2 CAPSULE BY MOUTH DAILY 01/09/20   [name], DO  traMADol (ULTRAM) 50 MG tablet Take 1 tablet (50 mg total) by mouth every 6 (six) hours as needed. 07/04/18   [name], MD  triamcinolone cream (KENALOG) 0.1 % Apply 1 application topically 2 (two) times daily. For 5 days, then twice daily as needed for rash and itching. 01/03/15   [name], MD  vitamin C (ASCORBIC ACID) 500 MG tablet Take 500 mg by mouth daily.    [provider]    Allergies    Lisinopril, Penicillins, Amoxicillin, and Ibuprofen  Review of Systems   Review of Systems  Constitutional: Negative for fever.  HENT: Positive for congestion and rhinorrhea.   Respiratory: Negative for cough.   Cardiovascular: Negative for chest pain.  Gastrointestinal: Negative for vomiting.  Musculoskeletal: Negative for myalgias.    Physical Exam Updated Vital Signs BP (!) 133/99 (BP Location: Left Arm)   Pulse 85   Temp 98 F (36.7 C) (Oral)   Resp 16   Ht 5\' 9"  (1.753 m)   Wt 74.8 kg   SpO2 98%   BMI 24.37 kg/m   Physical Exam Constitutional:      Appearance: He is well-developed.  Pulmonary:     Effort: Pulmonary effort is normal.  Musculoskeletal:        General: Normal range of motion.     Cervical back: Normal range of motion.  Skin:    General: Skin is warm and dry.  Neurological:     Mental Status: He is alert and oriented to person, place, and time.     ED Results / Procedures / Treatments   Labs (all labs ordered are listed, but only abnormal results are displayed) Labs Reviewed - No data to display  EKG None  Radiology No results found.  Procedures Procedures (including critical care time)  Medications Ordered in ED Medications - No data to display  ED Course  I have reviewed the triage vital signs and the nursing notes.  Pertinent labs  & imaging results that were available during my care of the patient were reviewed by me and considered in my medical decision making (see chart for details).    MDM Rules/Calculators/A&P                          Patient to ED with symptoms of afebrile URI. Similar to multiple visits for same. Well known to the ED with 59 visits in the last 6 months, 12 in the last 14 days. Homeless.  Patient  is well appearing. Normal VS. No serious medical condition felt present. Stable for discharge.    Final Clinical Impression(s) / ED Diagnoses Final diagnoses:  None   1. Normal exam 2. malingering  Rx / DC Orders ED Discharge Orders    None       Charlann Lange, PA-C 01/29/20 0217    Charlann Lange, PA-C 01/29/20 7897    Fatima Blank, MD 01/30/20 [phone]

## 2020-01-30 ENCOUNTER — Emergency Department (HOSPITAL_COMMUNITY)
Admission: EM | Admit: 2020-01-30 | Discharge: 2020-01-30 | Disposition: A | Discharge status: Home / Self Care | Payer: Medicare Other | Attending: Emergency Medicine | Admitting: Emergency Medicine

## 2020-01-30 ENCOUNTER — Emergency Department (HOSPITAL_COMMUNITY)
Admission: EM | Admit: 2020-01-30 | Discharge: 2020-01-31 | Disposition: A | Discharge status: Home / Self Care | Payer: Medicare Other | Source: Home / Self Care | Attending: Emergency Medicine | Admitting: Emergency Medicine

## 2020-01-30 ENCOUNTER — Other Ambulatory Visit: Payer: Self-pay

## 2020-01-30 ENCOUNTER — Encounter (HOSPITAL_COMMUNITY): Payer: Self-pay | Admitting: Emergency Medicine

## 2020-01-30 DIAGNOSIS — M79672 Pain in left foot: Secondary | ICD-10-CM

## 2020-01-30 DIAGNOSIS — Z5321 Procedure and treatment not carried out due to patient leaving prior to being seen by health care provider: Secondary | ICD-10-CM | POA: Diagnosis not present

## 2020-01-30 DIAGNOSIS — M25562 Pain in left knee: Secondary | ICD-10-CM | POA: Diagnosis present

## 2020-01-30 NOTE — ED Triage Notes (Signed)
Pt c/o left knee pain, denies injury, pt ambulatory.

## 2020-01-30 NOTE — ED Notes (Signed)
Pt named called for room, no response

## 2020-01-30 NOTE — ED Triage Notes (Signed)
Patient reports left foot pain for 2 days , denies injury /ambulatory.

## 2020-01-31 ENCOUNTER — Encounter (HOSPITAL_COMMUNITY): Payer: Self-pay | Admitting: Student

## 2020-01-31 NOTE — ED Notes (Signed)
Patient verbalizes understanding of discharge instructions. Opportunity for questioning and answers were provided. Armband removed by staff, pt discharged from ED stable & ambulatory  

## 2020-01-31 NOTE — Discharge Instructions (Addendum)
You are seen in the emergency department today for foot pain.  You do have an area of skin irritation to the back of your heel likely where your shoe irritates it.  Please keep this covered with a Band-Aid.  Take Tylenol and or Motrin per over-the-counter dosing to help with discomfort.  Follow-up with the [facility] within 3 days.  Return to the ER for new or worsening symptoms including but not limited to fever, chills, spreading redness, pus draining from the area, or any other concerns.

## 2020-01-31 NOTE — ED Provider Notes (Signed)
[facility] EMERGENCY DEPARTMENT Provider Note   CSN: [id number] Arrival date & time: 01/30/20  2206     History Chief Complaint  Patient presents with  . Foot Pain    [name] is a 69 y.o. male with a history of hypercholesterolemia, hypertension, and paranoid schizophrenia who presents to the emergency department with a few days of left foot pain.  States pain is constant, no alleviating or aggravating factors, no intervention prior to arrival.  Denies traumatic injury.  Denies fever, chills, redness, numbness, tingling, or weakness.  HPI     Past Medical History:  Diagnosis Date  . BPH (benign prostatic hyperplasia)   . Colon polyp 2010  . Depression   . GERD (gastroesophageal reflux disease)   . Hepatitis C    "caught it when I had a blood transfusion"  . High cholesterol   . History of blood transfusion    "when I was young"  . Hypertension   . Paranoid schizophrenia ([place])   . Prostate atrophy   . Retinal vein occlusion     Patient Active Problem List   Diagnosis Date Noted  . GERD (gastroesophageal reflux disease) 05/03/2019  . Hypotension 08/02/2018  . Low back sprain, initial encounter 10/21/2017  . History of drug abuse ([place]) 10/18/2017  . Gingival erythema 09/23/2017  . Chronic pain of left knee 11/24/2016  . Tobacco abuse   . Diastolic heart failure ([place]) 02/05/2015  . Healthcare maintenance 05/28/2014  . Hyperlipidemia 03/10/2014  . Patellar tendonitis 11/16/2012  . [place] OCCLUSION 06/03/2010  . PARANOID SCHIZOPHRENIA, CHRONIC 01/17/2009  . HYPERTENSION, BENIGN ESSENTIAL 01/17/2009  . BPH (benign prostatic hyperplasia) 01/17/2009  . HEPATITIS C 12/14/2008  . DEPRESSION 12/14/2008    Past Surgical History:  Procedure Laterality Date  . CIRCUMCISION  1959  . TONSILLECTOMY         Family History  Problem Relation Age of Onset  . Hypertension Mother   . Diabetes Mother   . Stroke Mother     Social  History   Tobacco Use  . Smoking status: Current Every Day Smoker    Packs/day: 0.50    Years: 48.00    Pack years: 24.00    Types: Cigarettes  . Smokeless tobacco: Never Used  Substance Use Topics  . Alcohol use: Yes  . Drug use: No    Home Medications Prior to Admission medications   Medication Sig Start Date End Date Taking? Authorizing Provider  acetaminophen (TYLENOL) 500 MG tablet Take 1 tablet (500 mg total) by mouth every 6 (six) hours as needed. 04/23/18   [name], [name], PA-C  amLODipine (NORVASC) 10 MG tablet Take 1 tablet (10 mg total) by mouth daily. 04/13/18   [name], DO  aspirin 81 MG tablet Take 1 tablet (81 mg total) by mouth daily. 09/24/14   [name], MD  atorvastatin (LIPITOR) 40 MG tablet Take 1 tablet (40 mg total) by mouth daily. 08/15/19   [name], DO  benztropine (COGENTIN) 1 MG tablet Take 1 tablet (1 mg total) by mouth 2 (two) times daily. 11/05/16   [name], MD  cetirizine (ZYRTEC ALLERGY) 10 MG tablet Take 1 tablet (10 mg total) by mouth daily. 12/09/19   [name], PA-C  famotidine (PEPCID) 20 MG tablet Take 1 tablet (20 mg total) by mouth 2 (two) times daily. 05/02/19   [name], DO  fluPHENAZine decanoate (PROLIXIN) 25 MG/ML injection Inject 25 mg into the muscle  every 14 (fourteen) days. Last dose 09/07/12    [provider]  fluticasone (FLONASE) 50 MCG/ACT nasal spray Place 2 sprays into both nostrils daily. 01/22/20   Muthersbaugh, Jarrett Soho, PA-C  loratadine (CLARITIN) 10 MG tablet Take 1 tablet (10 mg total) by mouth daily. 12/28/19   [name], DO  losartan (COZAAR) 100 MG tablet Take 1 tablet (100 mg total) by mouth daily. 11/28/19   Tammi Klippel, Sherin, DO  mometasone (NASONEX) 50 MCG/ACT nasal spray Place 2 sprays into the nose daily. 12/29/19   [name], PA-C  Multiple Vitamin (MULTIVITAMIN WITH MINERALS) TABS tablet Take 1 tablet by mouth daily. 06/28/14   [name], MD  sodium chloride  (OCEAN) 0.65 % SOLN nasal spray Place 1 spray into both nostrils as needed for congestion. 11/19/19   [name], PA-C  tamsulosin (FLOMAX) 0.4 MG CAPS capsule Take 2 capsules (0.8 mg total) by mouth daily. 11/27/17   [name], MD  tamsulosin (FLOMAX) 0.4 MG CAPS capsule TAKE 2 CAPSULE BY MOUTH DAILY 01/09/20   [name], DO  traMADol (ULTRAM) 50 MG tablet Take 1 tablet (50 mg total) by mouth every 6 (six) hours as needed. 07/04/18   [name], MD  triamcinolone cream (KENALOG) 0.1 % Apply 1 application topically 2 (two) times daily. For 5 days, then twice daily as needed for rash and itching. 01/03/15   [name], MD  vitamin C (ASCORBIC ACID) 500 MG tablet Take 500 mg by mouth daily.    [provider]    Allergies    Lisinopril, Penicillins, Amoxicillin, and Ibuprofen  Review of Systems   Review of Systems  Constitutional: Negative for chills and fever.  Respiratory: Negative for shortness of breath.   Cardiovascular: Negative for chest pain.  Gastrointestinal: Negative for abdominal pain and vomiting.  Musculoskeletal: Positive for arthralgias.  Skin: Negative for color change.  Neurological: Negative for weakness and numbness.    Physical Exam Updated Vital Signs BP (!) 147/76 (BP Location: Left Arm)   Pulse (!) 57   Temp (!) 97.3 F (36.3 C) (Oral)   Resp 20   SpO2 100%   Physical Exam Vitals and nursing note reviewed.  Constitutional:      General: He is not in acute distress.    Appearance: He is not ill-appearing or toxic-appearing.  HENT:     Head: Normocephalic and atraumatic.  Cardiovascular:     Pulses:          Dorsalis pedis pulses are 2+ on the right side and 2+ on the left side.       Posterior tibial pulses are 2+ on the right side and 2+ on the left side.  Pulmonary:     Effort: Pulmonary effort is normal.  Musculoskeletal:     Comments: Lower extremities: Patient has a very small skin abrasion to the upper  portion of the left heel consistent with area that his shoe hits the heel.  There is no surrounding erythema, warmth, or purulent drainage.  No palpable fluctuance or induration.  No edema.  Patient has intact AROM to bilateral hips, knees, ankles, and all digits.  No focal bony tenderness.  No calf tenderness.  Skin:    General: Skin is warm and dry.     Capillary Refill: Capillary refill takes less than 2 seconds.  Neurological:     Mental Status: He is alert.     Comments: Alert. Clear speech. Sensation grossly intact to bilateral lower extremities.  5/5 strength with plantar/dorsiflexion bilaterally. Patient ambulatory  Psychiatric:        Mood and Affect: Mood normal.        Behavior: Behavior normal.     ED Results / Procedures / Treatments   Labs (all labs ordered are listed, but only abnormal results are displayed) Labs Reviewed - No data to display  EKG None  Radiology No results found.  Procedures Procedures (including critical care time)  Medications Ordered in ED Medications - No data to display  ED Course  I have reviewed the triage vital signs and the nursing notes.  Pertinent labs & imaging results that were available during my care of the patient were reviewed by me and considered in my medical decision making (see chart for details).    MDM Rules/Calculators/A&P                          Patient presents to the emergency department with complaints of left foot pain.  Nontoxic, resting comfortably, vitals with mildly elevated BP, low suspicion for hypertensive emergency.  No recent trauma, no focal bony tenderness, doubt fracture or dislocation.  No asymmetric edema, no calf tenderness, doubt DVT.  Good symmetric pulses, not cool to the touch, doubt arterial emboli.  Patient does have a small abrasion to the posterior L heel consistent with where his shoe makes contact- likely local irritation, no signs of infection. Local wound care performed with bandage  applied.  Frequent ED visits for multiple complaints, appears appropriate for discharge at this time. I discussed treatment plan, need for follow-up, and return precautions with the patient. Provided opportunity for questions, patient confirmed understanding and is in agreement with plan.   Final Clinical Impression(s) / ED Diagnoses Final diagnoses:  Left foot pain    Rx / DC Orders ED Discharge Orders    None       Amaryllis Dyke, PA-C 01/31/20 [id number]    Ripley Fraise, MD 01/31/20 [phone]

## 2020-02-01 ENCOUNTER — Emergency Department (HOSPITAL_COMMUNITY)
Admission: EM | Admit: 2020-02-01 | Discharge: 2020-02-01 | Disposition: A | Discharge status: Home / Self Care | Payer: Medicare Other | Attending: Emergency Medicine | Admitting: Emergency Medicine

## 2020-02-01 ENCOUNTER — Other Ambulatory Visit: Payer: Self-pay

## 2020-02-01 ENCOUNTER — Encounter (HOSPITAL_COMMUNITY): Payer: Self-pay | Admitting: *Deleted

## 2020-02-01 DIAGNOSIS — Z79899 Other long term (current) drug therapy: Secondary | ICD-10-CM | POA: Insufficient documentation

## 2020-02-01 DIAGNOSIS — I110 Hypertensive heart disease with heart failure: Secondary | ICD-10-CM | POA: Insufficient documentation

## 2020-02-01 DIAGNOSIS — F17210 Nicotine dependence, cigarettes, uncomplicated: Secondary | ICD-10-CM | POA: Insufficient documentation

## 2020-02-01 DIAGNOSIS — I5030 Unspecified diastolic (congestive) heart failure: Secondary | ICD-10-CM | POA: Insufficient documentation

## 2020-02-01 DIAGNOSIS — Z7982 Long term (current) use of aspirin: Secondary | ICD-10-CM | POA: Insufficient documentation

## 2020-02-01 DIAGNOSIS — R0981 Nasal congestion: Secondary | ICD-10-CM | POA: Diagnosis present

## 2020-02-01 MED ORDER — FLUTICASONE PROPIONATE 50 MCG/ACT NA SUSP
2.0000 | Freq: Every day | NASAL | 0 refills | Status: DC
Start: 2020-02-01 — End: 2020-02-07

## 2020-02-01 MED ORDER — CETIRIZINE HCL 10 MG PO TABS
10.0000 mg | ORAL_TABLET | Freq: Every day | ORAL | 1 refills | Status: DC
Start: 2020-02-01 — End: 2022-06-16

## 2020-02-01 NOTE — Discharge Instructions (Signed)
Use nasal spray as prescribed for nasal congestion.  It may take a couple of days for this to become fully effective so consistency is important.  Take the cetirizine orally as prescribed as well.  This will help with nasal congestion.  You can also use nasal sinus rinses, nasal saline spray, humidifier, steam showers.  Drink plenty fluids and get rest.  Follow-up with primary care provider for reevaluation of symptoms.  Return to the emergency department if any concerning signs or symptoms develop.

## 2020-02-01 NOTE — ED Triage Notes (Signed)
The pt is c/o sinus problems hes here every night with strange complaints

## 2020-02-01 NOTE — ED Provider Notes (Signed)
[place] EMERGENCY DEPARTMENT Provider Note   CSN: [id number] Arrival date & time: 02/01/20  0130     History Chief Complaint  Patient presents with  . Facial Pain    [name] is a 69 y.o. male with history of BPH, hyperlipidemia, hypertension, schizophrenia, hepatitis, GERD presenting for evaluation of acute onset, persistent nasal congestion for 2 months.  He is well-known to this ED with 62 visits in the last 6 months including multiple visits over the last couple of days primarily for foot pain.  He tells me that he has had nasal congestion for 2 months.  I can see that he was seen for this complaint several times during this time as well and has been prescribed a variety of medications including Nasonex, Flonase, Claritin, Zyrtec.  He reports that he has tried some of these medications but ran out.  He thinks he may have refills at the pharmacy but is unsure.  He denies shortness of breath, fever, sick contacts, chest pain, sore throat.  No difficulty breathing or swallowing.    The history is provided by the patient.       Past Medical History:  Diagnosis Date  . BPH (benign prostatic hyperplasia)   . Colon polyp 2010  . Depression   . GERD (gastroesophageal reflux disease)   . Hepatitis C    "caught it when I had a blood transfusion"  . High cholesterol   . History of blood transfusion    "when I was young"  . Hypertension   . Paranoid schizophrenia ([place])   . Prostate atrophy   . Retinal vein occlusion     Patient Active Problem List   Diagnosis Date Noted  . GERD (gastroesophageal reflux disease) 05/03/2019  . Hypotension 08/02/2018  . Low back sprain, initial encounter 10/21/2017  . History of drug abuse ([place]) 10/18/2017  . Gingival erythema 09/23/2017  . Chronic pain of left knee 11/24/2016  . Tobacco abuse   . Diastolic heart failure ([place]) 02/05/2015  . Healthcare maintenance 05/28/2014  . Hyperlipidemia 03/10/2014  . Patellar  tendonitis 11/16/2012  . [place] OCCLUSION 06/03/2010  . PARANOID SCHIZOPHRENIA, CHRONIC 01/17/2009  . HYPERTENSION, BENIGN ESSENTIAL 01/17/2009  . BPH (benign prostatic hyperplasia) 01/17/2009  . HEPATITIS C 12/14/2008  . DEPRESSION 12/14/2008    Past Surgical History:  Procedure Laterality Date  . CIRCUMCISION  1959  . TONSILLECTOMY         Family History  Problem Relation Age of Onset  . Hypertension Mother   . Diabetes Mother   . Stroke Mother     Social History   Tobacco Use  . Smoking status: Current Every Day Smoker    Packs/day: 0.50    Years: 48.00    Pack years: 24.00    Types: Cigarettes  . Smokeless tobacco: Never Used  Substance Use Topics  . Alcohol use: Yes  . Drug use: No    Home Medications Prior to Admission medications   Medication Sig Start Date End Date Taking? Authorizing Provider  acetaminophen (TYLENOL) 500 MG tablet Take 1 tablet (500 mg total) by mouth every 6 (six) hours as needed. 04/23/18   Breelyn Icard A, PA-C  amLODipine (NORVASC) 10 MG tablet Take 1 tablet (10 mg total) by mouth daily. 04/13/18   [name], DO  aspirin 81 MG tablet Take 1 tablet (81 mg total) by mouth daily. 09/24/14   [name], MD  atorvastatin (LIPITOR) 40 MG tablet Take  1 tablet (40 mg total) by mouth daily. 08/15/19   [name], DO  benztropine (COGENTIN) 1 MG tablet Take 1 tablet (1 mg total) by mouth 2 (two) times daily. 11/05/16   [name], MD  cetirizine (ZYRTEC ALLERGY) 10 MG tablet Take 1 tablet (10 mg total) by mouth daily. 02/01/20   Zipporah Finamore A, PA-C  famotidine (PEPCID) 20 MG tablet Take 1 tablet (20 mg total) by mouth 2 (two) times daily. 05/02/19   [name], DO  fluPHENAZine decanoate (PROLIXIN) 25 MG/ML injection Inject 25 mg into the muscle every 14 (fourteen) days. Last dose 09/07/12    [provider]  fluticasone (FLONASE) 50 MCG/ACT nasal spray Place 2 sprays into both nostrils daily. 02/01/20    Arrionna Serena A, PA-C  loratadine (CLARITIN) 10 MG tablet Take 1 tablet (10 mg total) by mouth daily. 12/28/19   [name], DO  losartan (COZAAR) 100 MG tablet Take 1 tablet (100 mg total) by mouth daily. 11/28/19   Tammi Klippel, Sherin, DO  mometasone (NASONEX) 50 MCG/ACT nasal spray Place 2 sprays into the nose daily. 12/29/19   [name], PA-C  Multiple Vitamin (MULTIVITAMIN WITH MINERALS) TABS tablet Take 1 tablet by mouth daily. 06/28/14   [name], MD  sodium chloride (OCEAN) 0.65 % SOLN nasal spray Place 1 spray into both nostrils as needed for congestion. 11/19/19   [name], PA-C  tamsulosin (FLOMAX) 0.4 MG CAPS capsule Take 2 capsules (0.8 mg total) by mouth daily. 11/27/17   [name], MD  tamsulosin (FLOMAX) 0.4 MG CAPS capsule TAKE 2 CAPSULE BY MOUTH DAILY 01/09/20   [name], DO  traMADol (ULTRAM) 50 MG tablet Take 1 tablet (50 mg total) by mouth every 6 (six) hours as needed. 07/04/18   [name], MD  triamcinolone cream (KENALOG) 0.1 % Apply 1 application topically 2 (two) times daily. For 5 days, then twice daily as needed for rash and itching. 01/03/15   [name], MD  vitamin C (ASCORBIC ACID) 500 MG tablet Take 500 mg by mouth daily.    [provider]    Allergies    Lisinopril, Penicillins, Amoxicillin, and Ibuprofen  Review of Systems   Review of Systems  Constitutional: Negative for chills and fever.  HENT: Positive for congestion and rhinorrhea. Negative for sinus pressure, sinus pain, sore throat and trouble swallowing.   Respiratory: Negative for shortness of breath.   Cardiovascular: Negative for chest pain.    Physical Exam Updated Vital Signs BP 136/83 (BP Location: Left Arm)   Pulse 69   Temp 98.1 F (36.7 C) (Oral)   Resp 18   SpO2 100%   Physical Exam Vitals and nursing note reviewed.  Constitutional:      General: He is not in acute distress.    Appearance: He is well-developed.  HENT:      Head: Normocephalic and atraumatic.     Nose: Congestion present.     Comments: Sounds mildly congested.  No frontal or maxillary sinus tenderness    Mouth/Throat:     Mouth: Mucous membranes are moist.     Comments: No erythema, tonsillar hypertrophy, exudates, trismus, sublingual abnormalities or uvular deviation.  Tolerating secretions without difficulty Eyes:     General:        Right eye: No discharge.        Left eye: No discharge.     Conjunctiva/sclera: Conjunctivae normal.  Neck:     Vascular: No JVD.  Trachea: No tracheal deviation.  Cardiovascular:     Rate and Rhythm: Normal rate.  Pulmonary:     Effort: Pulmonary effort is normal.  Abdominal:     General: There is no distension.  Musculoskeletal:     Cervical back: Neck supple.  Skin:    General: Skin is warm and dry.     Findings: No erythema.  Neurological:     Mental Status: He is alert.  Psychiatric:        Behavior: Behavior normal.     ED Results / Procedures / Treatments   Labs (all labs ordered are listed, but only abnormal results are displayed) Labs Reviewed - No data to display  EKG None  Radiology No results found.  Procedures Procedures (including critical care time)  Medications Ordered in ED Medications - No data to display  ED Course  I have reviewed the triage vital signs and the nursing notes.  Pertinent labs & imaging results that were available during my care of the patient were reviewed by me and considered in my medical decision making (see chart for details).    MDM Rules/Calculators/A&P                          Patient well-known to this ED returns with complaint of nasal congestion which has been ongoing for months.  It had improved with medications prescribed by providers in the ED but he has run out of medications and is not sure if he has refills.  He is afebrile, vital signs are stable.  He is nontoxic in appearance.  On my assessment he is resting in bed, easily  arousable.  He has nasal congestion on examination but no sinus tenderness.  Doubt bacterial sinusitis, pneumonia, meningitis, strep pharyngitis, retropharyngeal abscess, peritonsillar abscess or deep space neck infection.  Will discharge with refills of fluticasone and cetirizine.  Discussed symptomatic management and follow-up with PCP.  Discussed strict ED return precautions. Patient verbalized understanding of and agreement with plan and is safe for discharge home at this time.   Final Clinical Impression(s) / ED Diagnoses Final diagnoses:  Nasal congestion    Rx / DC Orders ED Discharge Orders         Ordered    fluticasone (FLONASE) 50 MCG/ACT nasal spray  Daily     Discontinue  Reprint     02/01/20 0749    cetirizine (ZYRTEC ALLERGY) 10 MG tablet  Daily     Discontinue  Reprint     02/01/20 [place], Keston Seever A, PA-C 02/01/20 4332    Charlesetta Shanks, MD 02/01/20 1149

## 2020-02-02 ENCOUNTER — Emergency Department (HOSPITAL_COMMUNITY)
Admission: EM | Admit: 2020-02-02 | Discharge: 2020-02-02 | Disposition: A | Discharge status: Home / Self Care | Payer: Medicare Other | Attending: Emergency Medicine | Admitting: Emergency Medicine

## 2020-02-02 ENCOUNTER — Other Ambulatory Visit: Payer: Self-pay

## 2020-02-02 DIAGNOSIS — R0981 Nasal congestion: Secondary | ICD-10-CM | POA: Diagnosis not present

## 2020-02-02 DIAGNOSIS — I10 Essential (primary) hypertension: Secondary | ICD-10-CM | POA: Diagnosis not present

## 2020-02-02 DIAGNOSIS — F17210 Nicotine dependence, cigarettes, uncomplicated: Secondary | ICD-10-CM | POA: Diagnosis not present

## 2020-02-02 NOTE — ED Triage Notes (Signed)
Pt c/o nasal congestion and sinus problems. States he ran out of his meds. Pt has been seen here multiple times for same.

## 2020-02-02 NOTE — ED Provider Notes (Signed)
[facility] Emergency Department Provider Note MRN:  [id number]  Arrival date & time: 02/02/20     Chief Complaint   Nasal Congestion   History of Present Illness   [name] is a 69 y.o. year-old male with a history of schizophrenia, homelessness presenting to the ED with chief complaint of nasal congestion.  Continued nasal congestion, has been present for months, frequent ED visitor for the same.  Explains that he took the Flonase earlier today and it helped a lot.  Here for continued assistance.  Denies trouble breathing, no chest pain, no abdominal pain, no fever, no other complaints.  Review of Systems  A complete 10 system review of systems was obtained and all systems are negative except as noted in the HPI and PMH.   Patient's Health History    Past Medical History:  Diagnosis Date  . BPH (benign prostatic hyperplasia)   . Colon polyp 2010  . Depression   . GERD (gastroesophageal reflux disease)   . Hepatitis C    "caught it when I had a blood transfusion"  . High cholesterol   . History of blood transfusion    "when I was young"  . Hypertension   . Paranoid schizophrenia ([place])   . Prostate atrophy   . Retinal vein occlusion     Past Surgical History:  Procedure Laterality Date  . CIRCUMCISION  1959  . TONSILLECTOMY      Family History  Problem Relation Age of Onset  . Hypertension Mother   . Diabetes Mother   . Stroke Mother     Social History   Socioeconomic History  . Marital status: Married    Spouse name: Not on file  . Number of children: Not on file  . Years of education: Not on file  . Highest education level: Not on file  Occupational History  . Not on file  Tobacco Use  . Smoking status: Current Every Day Smoker    Packs/day: 0.50    Years: 48.00    Pack years: 24.00    Types: Cigarettes  . Smokeless tobacco: Never Used  Substance and Sexual Activity  . Alcohol use: Yes  . Drug use: No  . Sexual  activity: Yes  Other Topics Concern  . Not on file  Social History Narrative  . Not on file   Social Determinants of Health   Financial Resource Strain:   . Difficulty of Paying Living Expenses:   Food Insecurity:   . Worried About Charity fundraiser in the Last Year:   . Arboriculturist in the Last Year:   Transportation Needs:   . Film/video editor (Medical):   Marland Kitchen Lack of Transportation (Non-Medical):   Physical Activity:   . Days of Exercise per Week:   . Minutes of Exercise per Session:   Stress:   . Feeling of Stress :   Social Connections:   . Frequency of Communication with Friends and Family:   . Frequency of Social Gatherings with Friends and Family:   . Attends Religious Services:   . Active Member of Clubs or Organizations:   . Attends Archivist Meetings:   Marland Kitchen Marital Status:   Intimate Partner Violence:   . Fear of Current or Ex-Partner:   . Emotionally Abused:   Marland Kitchen Physically Abused:   . Sexually Abused:      Physical Exam   Vitals:   02/02/20 0022  BP: (!) 174/101  Pulse:  68  Resp: 18  Temp: 98.3 F (36.8 C)  SpO2: 100%    CONSTITUTIONAL: Well-appearing, NAD NEURO:  Alert and oriented x 3, normal and symmetric strength and sensation, impaired speech EYES:  eyes equal and reactive ENT/NECK:  no LAD, no JVD, no significant mucosal edema or erythema to the nasal turbinates CARDIO: Regular rate, well-perfused, normal S1 and S2 PULM:  CTAB no wheezing or rhonchi GI/GU:  normal bowel sounds, non-distended, non-tender MSK/SPINE:  No gross deformities, no edema SKIN:  no rash, atraumatic PSYCH:  Appropriate speech and behavior  *Additional and/or pertinent findings included in MDM below  Diagnostic and Interventional Summary    EKG Interpretation  Date/Time:    Ventricular Rate:    PR Interval:    QRS Duration:   QT Interval:    QTC Calculation:   R Axis:     Text Interpretation:        Labs Reviewed - No data to display   No orders to display    Medications - No data to display   Procedures  /  Critical Care Procedures  ED Course and Medical Decision Making  I have reviewed the triage vital signs, the nursing notes, and pertinent available records from the EMR.  Listed above are laboratory and imaging tests that I personally ordered, reviewed, and interpreted and then considered in my medical decision making (see below for details).      Frequent ED visitor for the same, normal vital signs, well-appearing in no acute distress, per staff that are familiar with patient he is at his baseline in terms of his speech.  Doubt emergent process, appropriate for discharge.    Barth Kirks. [place]onia Small, [place] [email or web address]  Final Clinical Impressions(s) / ED Diagnoses     ICD-10-CM   1. Nasal congestion  R09.81     ED Discharge Orders    None       Discharge Instructions Discussed with and Provided to Patient:     Discharge Instructions     You were evaluated in the Emergency Department and after careful evaluation, we did not find any emergent condition requiring admission or further testing in the hospital.  Your exam/testing today was overall reassuring.  Continue taking your Flonase as needed.  Follow-up with ENT if you continue to have issues.  Please return to the Emergency Department if you experience any worsening of your condition. Thank you for allowing [place] to be a part of your care.       Maudie Flakes, MD 02/02/20 [phone]

## 2020-02-02 NOTE — Discharge Instructions (Addendum)
You were evaluated in the Emergency Department and after careful evaluation, we did not find any emergent condition requiring admission or further testing in the hospital.  Your exam/testing today was overall reassuring.  Continue taking your Flonase as needed.  Follow-up with ENT if you continue to have issues.  Please return to the Emergency Department if you experience any worsening of your condition. Thank you for allowing [place] to be a part of your care.

## 2020-02-03 ENCOUNTER — Other Ambulatory Visit: Payer: Self-pay

## 2020-02-03 ENCOUNTER — Emergency Department (HOSPITAL_COMMUNITY)
Admission: EM | Admit: 2020-02-03 | Discharge: 2020-02-03 | Disposition: A | Discharge status: Home / Self Care | Payer: Medicare Other | Attending: Emergency Medicine | Admitting: Emergency Medicine

## 2020-02-03 ENCOUNTER — Encounter (HOSPITAL_COMMUNITY): Payer: Self-pay | Admitting: Emergency Medicine

## 2020-02-03 DIAGNOSIS — R0981 Nasal congestion: Secondary | ICD-10-CM | POA: Diagnosis not present

## 2020-02-03 DIAGNOSIS — F17210 Nicotine dependence, cigarettes, uncomplicated: Secondary | ICD-10-CM | POA: Insufficient documentation

## 2020-02-03 DIAGNOSIS — R519 Headache, unspecified: Secondary | ICD-10-CM | POA: Diagnosis present

## 2020-02-03 NOTE — Discharge Instructions (Addendum)
Continue using flonase for nasal congestion.  You will likely have continued nasal congestion, as long as the flonase is helping, continue to use it.  Return to the ER if you develop fevers, difficulty breathing, or any new, worsening, or concerning symptoms.

## 2020-02-03 NOTE — ED Provider Notes (Signed)
[facility] EMERGENCY DEPARTMENT Provider Note   CSN: [id number] Arrival date & time: 02/03/20  0008     History No chief complaint on file.   [name] is a 69 y.o. male presenting for evaluation of nasal congestion.  Patient states he has had nasal congestion for several days.  He has been taking Flonase, which relieves his congestion for short period of time.  He is here today because he continues to have nasal congestion.  No change from when he was seen yesterday.  He denies fevers, chills, or facial pain.  Additional history obtained from chart review. This is pt's 64th visit in 6 months, frequently seen for nasal congestion as well as foot pain.  Additional history of schizophrenia, hypertension  HPI     Past Medical History:  Diagnosis Date  . BPH (benign prostatic hyperplasia)   . Colon polyp 2010  . Depression   . GERD (gastroesophageal reflux disease)   . Hepatitis C    "caught it when I had a blood transfusion"  . High cholesterol   . History of blood transfusion    "when I was young"  . Hypertension   . Paranoid schizophrenia ([place])   . Prostate atrophy   . Retinal vein occlusion     Patient Active Problem List   Diagnosis Date Noted  . GERD (gastroesophageal reflux disease) 05/03/2019  . Hypotension 08/02/2018  . Low back sprain, initial encounter 10/21/2017  . History of drug abuse ([place]) 10/18/2017  . Gingival erythema 09/23/2017  . Chronic pain of left knee 11/24/2016  . Tobacco abuse   . Diastolic heart failure ([place]) 02/05/2015  . Healthcare maintenance 05/28/2014  . Hyperlipidemia 03/10/2014  . Patellar tendonitis 11/16/2012  . [place] OCCLUSION 06/03/2010  . PARANOID SCHIZOPHRENIA, CHRONIC 01/17/2009  . HYPERTENSION, BENIGN ESSENTIAL 01/17/2009  . BPH (benign prostatic hyperplasia) 01/17/2009  . HEPATITIS C 12/14/2008  . DEPRESSION 12/14/2008    Past Surgical History:  Procedure Laterality Date  .  CIRCUMCISION  1959  . TONSILLECTOMY         Family History  Problem Relation Age of Onset  . Hypertension Mother   . Diabetes Mother   . Stroke Mother     Social History   Tobacco Use  . Smoking status: Current Every Day Smoker    Packs/day: 0.50    Years: 48.00    Pack years: 24.00    Types: Cigarettes  . Smokeless tobacco: Never Used  Substance Use Topics  . Alcohol use: Yes  . Drug use: No    Home Medications Prior to Admission medications   Medication Sig Start Date End Date Taking? Authorizing Provider  acetaminophen (TYLENOL) 500 MG tablet Take 1 tablet (500 mg total) by mouth every 6 (six) hours as needed. 04/23/18   [name], [name], PA-C  amLODipine (NORVASC) 10 MG tablet Take 1 tablet (10 mg total) by mouth daily. 04/13/18   [name], DO  aspirin 81 MG tablet Take 1 tablet (81 mg total) by mouth daily. 09/24/14   [name], MD  atorvastatin (LIPITOR) 40 MG tablet Take 1 tablet (40 mg total) by mouth daily. 08/15/19   [name], DO  benztropine (COGENTIN) 1 MG tablet Take 1 tablet (1 mg total) by mouth 2 (two) times daily. 11/05/16   [name], MD  cetirizine (ZYRTEC ALLERGY) 10 MG tablet Take 1 tablet (10 mg total) by mouth daily. 02/01/20   Nils Flack, [name], PA-C  famotidine (  PEPCID) 20 MG tablet Take 1 tablet (20 mg total) by mouth 2 (two) times daily. 05/02/19   [name], DO  fluPHENAZine decanoate (PROLIXIN) 25 MG/ML injection Inject 25 mg into the muscle every 14 (fourteen) days. Last dose 09/07/12    [provider]  fluticasone (FLONASE) 50 MCG/ACT nasal spray Place 2 sprays into both nostrils daily. 02/01/20   [name], [name], PA-C  loratadine (CLARITIN) 10 MG tablet Take 1 tablet (10 mg total) by mouth daily. 12/28/19   [name], DO  losartan (COZAAR) 100 MG tablet Take 1 tablet (100 mg total) by mouth daily. 11/28/19   Tammi Klippel, Sherin, DO  mometasone (NASONEX) 50 MCG/ACT nasal spray Place 2 sprays into the nose daily. 12/29/19    [name], PA-C  Multiple Vitamin (MULTIVITAMIN WITH MINERALS) TABS tablet Take 1 tablet by mouth daily. 06/28/14   [name], MD  sodium chloride (OCEAN) 0.65 % SOLN nasal spray Place 1 spray into both nostrils as needed for congestion. 11/19/19   [name], PA-C  tamsulosin (FLOMAX) 0.4 MG CAPS capsule Take 2 capsules (0.8 mg total) by mouth daily. 11/27/17   [name], MD  tamsulosin (FLOMAX) 0.4 MG CAPS capsule TAKE 2 CAPSULE BY MOUTH DAILY 01/09/20   [name], DO  traMADol (ULTRAM) 50 MG tablet Take 1 tablet (50 mg total) by mouth every 6 (six) hours as needed. 07/04/18   [name], MD  triamcinolone cream (KENALOG) 0.1 % Apply 1 application topically 2 (two) times daily. For 5 days, then twice daily as needed for rash and itching. 01/03/15   [name], MD  vitamin C (ASCORBIC ACID) 500 MG tablet Take 500 mg by mouth daily.    [provider]    Allergies    Lisinopril, Penicillins, Amoxicillin, and Ibuprofen  Review of Systems   Review of Systems  Constitutional: Negative for fever.  HENT: Positive for congestion.     Physical Exam Updated Vital Signs BP (!) 186/98 (BP Location: Left Arm)   Pulse 69   Temp 98 F (36.7 C) (Oral)   Resp 16   Ht 5\' 9"  (1.753 m)   Wt 74.8 kg   SpO2 100%   BMI 24.37 kg/m   Physical Exam Vitals and nursing note reviewed.  Constitutional:      General: He is not in acute distress.    Appearance: He is well-developed.     Comments: Appears nontoxic  HENT:     Head: Normocephalic and atraumatic.     Nose:     Comments: Mild edema of the nares with clear rhinorrhea. No sinus ttp.  Cardiovascular:     Rate and Rhythm: Normal rate and regular rhythm.     Pulses: Normal pulses.  Pulmonary:     Effort: Pulmonary effort is normal.     Breath sounds: Normal breath sounds.  Abdominal:     General: There is no distension.  Musculoskeletal:        General: Normal range of motion.      Cervical back: Normal range of motion.  Skin:    General: Skin is warm.     Findings: No rash.  Neurological:     Mental Status: He is alert and oriented to person, place, and time.     ED Results / Procedures / Treatments   Labs (all labs ordered are listed, but only abnormal results are displayed) Labs Reviewed - No data to display  EKG None  Radiology No  results found.  Procedures Procedures (including critical care time)  Medications Ordered in ED Medications - No data to display  ED Course  I have reviewed the triage vital signs and the nursing notes.  Pertinent labs & imaging results that were available during my care of the patient were reviewed by me and considered in my medical decision making (see chart for details).    MDM Rules/Calculators/A&P                          Patient presenting for evaluation of continued nasal congestion.  On exam, patient peers nontoxic.  He is afebrile and without tenderness.  Low suspicion for bacterial infection.  Likely continued nasal congestion, which is being well managed with Flonase.  He was seen yesterday for the same, in fact has had multiple visits in the ER for this recently.  Discussed that nasal congestion will likely continue for a long time, and he should continue treatment with Flonase.  Discussed that at this time there does not appear to be an emergent or life-threatening condition requiring further imaging, labs, medications.  Discussed continued symptomatic treatment.  At this time, patient appears safe for discharge.  Return precautions given.  Patient states he understands and agrees to plan.  Final Clinical Impression(s) / ED Diagnoses Final diagnoses:  Nasal congestion    Rx / DC Orders ED Discharge Orders    None       Franchot Heidelberg, PA-C 02/03/20 0122    Palumbo, April, MD 02/03/20 [id number]

## 2020-02-03 NOTE — ED Notes (Signed)
Discharge instructions discussed with pt. Pt verbalized understanding. Pt stable and ambulatory. No signature pad available. 

## 2020-02-03 NOTE — ED Triage Notes (Signed)
Pt presents to ED POV. Pt c/o sinus pain and L leg pain. Seen yesterday for same

## 2020-02-04 ENCOUNTER — Other Ambulatory Visit: Payer: Self-pay

## 2020-02-04 ENCOUNTER — Emergency Department (HOSPITAL_COMMUNITY)
Admission: EM | Admit: 2020-02-04 | Discharge: 2020-02-04 | Disposition: A | Discharge status: Home / Self Care | Payer: Medicare Other | Attending: Emergency Medicine | Admitting: Emergency Medicine

## 2020-02-04 DIAGNOSIS — Z590 Homelessness: Secondary | ICD-10-CM | POA: Diagnosis not present

## 2020-02-04 DIAGNOSIS — F17210 Nicotine dependence, cigarettes, uncomplicated: Secondary | ICD-10-CM | POA: Diagnosis not present

## 2020-02-04 DIAGNOSIS — M25562 Pain in left knee: Secondary | ICD-10-CM | POA: Diagnosis present

## 2020-02-04 DIAGNOSIS — I10 Essential (primary) hypertension: Secondary | ICD-10-CM | POA: Diagnosis not present

## 2020-02-04 DIAGNOSIS — Z79899 Other long term (current) drug therapy: Secondary | ICD-10-CM | POA: Diagnosis not present

## 2020-02-04 DIAGNOSIS — Z7982 Long term (current) use of aspirin: Secondary | ICD-10-CM | POA: Diagnosis not present

## 2020-02-04 MED ORDER — ACETAMINOPHEN 325 MG PO TABS: 650.0000 mg | ORAL_TABLET | Freq: Once | ORAL | Status: DC

## 2020-02-04 NOTE — Discharge Instructions (Signed)
Thank you for allowing me to care for you today in the Emergency Department.   Apply an ice pack to your knee for 15-20 minutes up to 3-4 times daily. Elevate your left leg so your toes are at or above the level of your nose. Take Tylenol as directed on the bottle for pain. Avoid ibuprofen because this can be harmful to your kidneys.  Follow up with your primary clinician for a recheck.  Return to the ER for new or worsening symptoms.

## 2020-02-04 NOTE — ED Triage Notes (Signed)
Pt c/o nasal congestion and knee pain. Seen multiple times this month for same.

## 2020-02-04 NOTE — ED Provider Notes (Signed)
[facility] EMERGENCY DEPARTMENT Provider Note   CSN: [id number] Arrival date & time: 02/04/20  0023     History Chief Complaint  Patient presents with  . Nasal Congestion  . Knee Pain    [name] is a 69 y.o. male with a history of schizophrenia and homelessness presenting to the emergency department with a chief complaint of left knee pain.  Patient is well-known to this emergency department with 65 visits in the last 6 months and sometimes will visit the ER 1-2 times in a day.  Per triage note, the patient was endorsing nasal congestion and left knee pain, but on my evaluation he denies any nasal congestion and states that he has only here to be evaluated for his left knee.  He denies any recent falls or injuries.  He is unable to characterize the pain.  He has been able to walk.  No left ankle or hip pain.  No treatment prior to arrival.  He denies alcohol use.   The history is provided by the patient. No language interpreter was used.       Past Medical History:  Diagnosis Date  . BPH (benign prostatic hyperplasia)   . Colon polyp 2010  . Depression   . GERD (gastroesophageal reflux disease)   . Hepatitis C    "caught it when I had a blood transfusion"  . High cholesterol   . History of blood transfusion    "when I was young"  . Hypertension   . Paranoid schizophrenia ([place])   . Prostate atrophy   . Retinal vein occlusion     Patient Active Problem List   Diagnosis Date Noted  . GERD (gastroesophageal reflux disease) 05/03/2019  . Hypotension 08/02/2018  . Low back sprain, initial encounter 10/21/2017  . History of drug abuse ([place]) 10/18/2017  . Gingival erythema 09/23/2017  . Chronic pain of left knee 11/24/2016  . Tobacco abuse   . Diastolic heart failure ([place]) 02/05/2015  . Healthcare maintenance 05/28/2014  . Hyperlipidemia 03/10/2014  . Patellar tendonitis 11/16/2012  . [place] OCCLUSION 06/03/2010  . PARANOID  SCHIZOPHRENIA, CHRONIC 01/17/2009  . HYPERTENSION, BENIGN ESSENTIAL 01/17/2009  . BPH (benign prostatic hyperplasia) 01/17/2009  . HEPATITIS C 12/14/2008  . DEPRESSION 12/14/2008    Past Surgical History:  Procedure Laterality Date  . CIRCUMCISION  1959  . TONSILLECTOMY         Family History  Problem Relation Age of Onset  . Hypertension Mother   . Diabetes Mother   . Stroke Mother     Social History   Tobacco Use  . Smoking status: Current Every Day Smoker    Packs/day: 0.50    Years: 48.00    Pack years: 24.00    Types: Cigarettes  . Smokeless tobacco: Never Used  Substance Use Topics  . Alcohol use: Yes  . Drug use: No    Home Medications Prior to Admission medications   Medication Sig Start Date End Date Taking? Authorizing Provider  acetaminophen (TYLENOL) 500 MG tablet Take 1 tablet (500 mg total) by mouth every 6 (six) hours as needed. 04/23/18   [name], [name], PA-C  amLODipine (NORVASC) 10 MG tablet Take 1 tablet (10 mg total) by mouth daily. 04/13/18   [name], DO  aspirin 81 MG tablet Take 1 tablet (81 mg total) by mouth daily. 09/24/14   [name], MD  atorvastatin (LIPITOR) 40 MG tablet Take 1 tablet (40 mg total) by  mouth daily. 08/15/19   [name], DO  benztropine (COGENTIN) 1 MG tablet Take 1 tablet (1 mg total) by mouth 2 (two) times daily. 11/05/16   [name], MD  cetirizine (ZYRTEC ALLERGY) 10 MG tablet Take 1 tablet (10 mg total) by mouth daily. 02/01/20   [name], [name], PA-C  famotidine (PEPCID) 20 MG tablet Take 1 tablet (20 mg total) by mouth 2 (two) times daily. 05/02/19   [name], DO  fluPHENAZine decanoate (PROLIXIN) 25 MG/ML injection Inject 25 mg into the muscle every 14 (fourteen) days. Last dose 09/07/12    [provider]  fluticasone (FLONASE) 50 MCG/ACT nasal spray Place 2 sprays into both nostrils daily. 02/01/20   [name], [name], PA-C  loratadine (CLARITIN) 10 MG tablet Take 1 tablet (10 mg total)  by mouth daily. 12/28/19   [name], DO  losartan (COZAAR) 100 MG tablet Take 1 tablet (100 mg total) by mouth daily. 11/28/19   Tammi Klippel, Sherin, DO  mometasone (NASONEX) 50 MCG/ACT nasal spray Place 2 sprays into the nose daily. 12/29/19   [name], PA-C  Multiple Vitamin (MULTIVITAMIN WITH MINERALS) TABS tablet Take 1 tablet by mouth daily. 06/28/14   [name], MD  sodium chloride (OCEAN) 0.65 % SOLN nasal spray Place 1 spray into both nostrils as needed for congestion. 11/19/19   [name], PA-C  tamsulosin (FLOMAX) 0.4 MG CAPS capsule Take 2 capsules (0.8 mg total) by mouth daily. 11/27/17   [name], MD  tamsulosin (FLOMAX) 0.4 MG CAPS capsule TAKE 2 CAPSULE BY MOUTH DAILY 01/09/20   [name], DO  traMADol (ULTRAM) 50 MG tablet Take 1 tablet (50 mg total) by mouth every 6 (six) hours as needed. 07/04/18   [name], MD  triamcinolone cream (KENALOG) 0.1 % Apply 1 application topically 2 (two) times daily. For 5 days, then twice daily as needed for rash and itching. 01/03/15   [name], MD  vitamin C (ASCORBIC ACID) 500 MG tablet Take 500 mg by mouth daily.    [provider]    Allergies    Lisinopril, Penicillins, Amoxicillin, and Ibuprofen  Review of Systems   Review of Systems  Constitutional: Negative for appetite change and fever.  Respiratory: Negative for shortness of breath.   Cardiovascular: Negative for chest pain.  Gastrointestinal: Negative for abdominal pain, diarrhea, nausea and vomiting.  Genitourinary: Negative for dysuria.  Musculoskeletal: Positive for arthralgias and myalgias. Negative for back pain.  Skin: Negative for rash.  Allergic/Immunologic: Negative for immunocompromised state.  Neurological: Negative for syncope, weakness and headaches.  Psychiatric/Behavioral: Negative for confusion.    Physical Exam Updated Vital Signs BP (!) 146/83 (BP Location: Right Arm)   Pulse 63   Temp 98.3 F  (36.8 C) (Oral)   Resp 18   SpO2 99%   Physical Exam Vitals and nursing note reviewed.  Constitutional:      Appearance: He is well-developed.     Comments: Sitting comfortably in the chair with a large [place] drink and wearing sunglasses.  HENT:     Head: Normocephalic.  Eyes:     Conjunctiva/sclera: Conjunctivae normal.  Cardiovascular:     Rate and Rhythm: Normal rate and regular rhythm.     Heart sounds: No murmur heard.   Pulmonary:     Effort: Pulmonary effort is normal.  Abdominal:     General: There is no distension.     Palpations: Abdomen is soft.  Musculoskeletal:  Cervical back: Neck supple.     Comments: No tenderness palpation to the left medial or lateral joint line.  Negative valgus and varus stress test.  Full active and passive range of motion of the bilateral hips, knees, and ankles without tenderness to palpation.  Able to bear weight on the bilateral lower extremities.  Sensation is intact and equal throughout the bilateral lower extremities.  Extremities are well-perfused.  Out of 5 strength against resistance of the bilateral lower extremities.  There is no redness, warmth, swelling noted to the left knee.  Skin:    General: Skin is warm and dry.  Neurological:     Mental Status: He is alert.     Comments: Gait is not antalgic.  Psychiatric:        Behavior: Behavior normal.     ED Results / Procedures / Treatments   Labs (all labs ordered are listed, but only abnormal results are displayed) Labs Reviewed - No data to display  EKG None  Radiology No results found.  Procedures Procedures (including critical care time)  Medications Ordered in ED Medications  acetaminophen (TYLENOL) tablet 650 mg (650 mg Oral Refused 02/04/20 0100)    ED Course  I have reviewed the triage vital signs and the nursing notes.  Pertinent labs & imaging results that were available during my care of the patient were reviewed by me and considered in my  medical decision making (see chart for details).    MDM Rules/Calculators/A&P                          69 year old male who is well-known to this emergency department with 65 visits in the last 6 months.  He will typically have 1-2 visits almost daily.  He has a history of homelessness and schizophrenia.  He typically presents with complaints of nasal congestion or arthralgias.  He denies any nasal congestion today.  Given that patient has been focal tenderness on exam, low suspicion for fracture.  There is no evidence of trauma to the knee.  Doubt septic joints or gout flare.  The patient was given a dose of Tylenol and advised to follow-up with primary care for chronic left knee pain.  He is hemodynamically stable and in no acute distress.  Safe for discharge home with outpatient follow-up as indicated.  Final Clinical Impression(s) / ED Diagnoses Final diagnoses:  Acute pain of left knee    Rx / DC Orders ED Discharge Orders    None       Joanne Gavel, PA-C 85/88/50 [id number]    Delora Fuel, MD 12/87/86 [phone]

## 2020-02-05 ENCOUNTER — Encounter (HOSPITAL_COMMUNITY): Payer: Self-pay

## 2020-02-05 ENCOUNTER — Other Ambulatory Visit: Payer: Self-pay

## 2020-02-05 ENCOUNTER — Emergency Department (HOSPITAL_COMMUNITY)
Admission: EM | Admit: 2020-02-05 | Discharge: 2020-02-05 | Disposition: A | Discharge status: Home / Self Care | Payer: Medicare Other | Attending: Emergency Medicine | Admitting: Emergency Medicine

## 2020-02-05 ENCOUNTER — Other Ambulatory Visit (HOSPITAL_COMMUNITY): Payer: Self-pay | Admitting: *Deleted

## 2020-02-05 DIAGNOSIS — I110 Hypertensive heart disease with heart failure: Secondary | ICD-10-CM | POA: Diagnosis not present

## 2020-02-05 DIAGNOSIS — I10 Essential (primary) hypertension: Secondary | ICD-10-CM

## 2020-02-05 DIAGNOSIS — Z7982 Long term (current) use of aspirin: Secondary | ICD-10-CM | POA: Insufficient documentation

## 2020-02-05 DIAGNOSIS — R0981 Nasal congestion: Secondary | ICD-10-CM

## 2020-02-05 DIAGNOSIS — F17210 Nicotine dependence, cigarettes, uncomplicated: Secondary | ICD-10-CM | POA: Insufficient documentation

## 2020-02-05 DIAGNOSIS — J3489 Other specified disorders of nose and nasal sinuses: Secondary | ICD-10-CM | POA: Insufficient documentation

## 2020-02-05 DIAGNOSIS — Z79899 Other long term (current) drug therapy: Secondary | ICD-10-CM | POA: Diagnosis not present

## 2020-02-05 DIAGNOSIS — I5032 Chronic diastolic (congestive) heart failure: Secondary | ICD-10-CM | POA: Diagnosis not present

## 2020-02-05 MED ORDER — DEXAMETHASONE 4 MG PO TABS
10.0000 mg | ORAL_TABLET | Freq: Once | ORAL | Status: AC
  Administered 2020-02-05: 10 mg via ORAL
  Filled 2020-02-05: qty 3

## 2020-02-05 NOTE — ED Triage Notes (Signed)
Pt here for chronic nasal congestion

## 2020-02-05 NOTE — ED Provider Notes (Signed)
[facility] EMERGENCY DEPARTMENT Provider Note   CSN: [id number] Arrival date & time: 02/05/20  0033   History Chief Complaint  Patient presents with  . Nasal Congestion    [name] is a 69 y.o. male.  The history is provided by the patient.  He has history of hypertension, hyperlipidemia, diastolic heart failure, schizophrenia and comes in complaining of ongoing nasal congestion.  He has noted some clear rhinorrhea.  He denies fever, chills, sweats.  He denies any cough.  He has been using Flonase without any benefit.  Past Medical History:  Diagnosis Date  . BPH (benign prostatic hyperplasia)   . Colon polyp 2010  . Depression   . GERD (gastroesophageal reflux disease)   . Hepatitis C    "caught it when I had a blood transfusion"  . High cholesterol   . History of blood transfusion    "when I was young"  . Hypertension   . Paranoid schizophrenia ([place])   . Prostate atrophy   . Retinal vein occlusion     Patient Active Problem List   Diagnosis Date Noted  . GERD (gastroesophageal reflux disease) 05/03/2019  . Hypotension 08/02/2018  . Low back sprain, initial encounter 10/21/2017  . History of drug abuse ([place]) 10/18/2017  . Gingival erythema 09/23/2017  . Chronic pain of left knee 11/24/2016  . Tobacco abuse   . Diastolic heart failure ([place]) 02/05/2015  . Healthcare maintenance 05/28/2014  . Hyperlipidemia 03/10/2014  . Patellar tendonitis 11/16/2012  . [place] OCCLUSION 06/03/2010  . PARANOID SCHIZOPHRENIA, CHRONIC 01/17/2009  . HYPERTENSION, BENIGN ESSENTIAL 01/17/2009  . BPH (benign prostatic hyperplasia) 01/17/2009  . HEPATITIS C 12/14/2008  . DEPRESSION 12/14/2008    Past Surgical History:  Procedure Laterality Date  . CIRCUMCISION  1959  . TONSILLECTOMY         Family History  Problem Relation Age of Onset  . Hypertension Mother   . Diabetes Mother   . Stroke Mother     Social History   Tobacco Use  .  Smoking status: Current Every Day Smoker    Packs/day: 0.50    Years: 48.00    Pack years: 24.00    Types: Cigarettes  . Smokeless tobacco: Never Used  Substance Use Topics  . Alcohol use: Yes  . Drug use: No    Home Medications Prior to Admission medications   Medication Sig Start Date End Date Taking? Authorizing Provider  acetaminophen (TYLENOL) 500 MG tablet Take 1 tablet (500 mg total) by mouth every 6 (six) hours as needed. 04/23/18   [name], [name], PA-C  amLODipine (NORVASC) 10 MG tablet Take 1 tablet (10 mg total) by mouth daily. 04/13/18   [name], DO  aspirin 81 MG tablet Take 1 tablet (81 mg total) by mouth daily. 09/24/14   [name], MD  atorvastatin (LIPITOR) 40 MG tablet Take 1 tablet (40 mg total) by mouth daily. 08/15/19   [name], DO  benztropine (COGENTIN) 1 MG tablet Take 1 tablet (1 mg total) by mouth 2 (two) times daily. 11/05/16   [name], MD  cetirizine (ZYRTEC ALLERGY) 10 MG tablet Take 1 tablet (10 mg total) by mouth daily. 02/01/20   [name], [name], PA-C  famotidine (PEPCID) 20 MG tablet Take 1 tablet (20 mg total) by mouth 2 (two) times daily. 05/02/19   [name], DO  fluPHENAZine decanoate (PROLIXIN) 25 MG/ML injection Inject 25 mg into the muscle every 14 (fourteen) days.  Last dose 09/07/12    [provider]  fluticasone (FLONASE) 50 MCG/ACT nasal spray Place 2 sprays into both nostrils daily. 02/01/20   [name], [name], PA-C  loratadine (CLARITIN) 10 MG tablet Take 1 tablet (10 mg total) by mouth daily. 12/28/19   [name], DO  losartan (COZAAR) 100 MG tablet Take 1 tablet (100 mg total) by mouth daily. 11/28/19   Tammi Klippel, Sherin, DO  mometasone (NASONEX) 50 MCG/ACT nasal spray Place 2 sprays into the nose daily. 12/29/19   [name], PA-C  Multiple Vitamin (MULTIVITAMIN WITH MINERALS) TABS tablet Take 1 tablet by mouth daily. 06/28/14   [name], MD  sodium chloride (OCEAN) 0.65 % SOLN nasal spray Place 1  spray into both nostrils as needed for congestion. 11/19/19   [name], PA-C  tamsulosin (FLOMAX) 0.4 MG CAPS capsule Take 2 capsules (0.8 mg total) by mouth daily. 11/27/17   [name], MD  tamsulosin (FLOMAX) 0.4 MG CAPS capsule TAKE 2 CAPSULE BY MOUTH DAILY 01/09/20   [name], DO  traMADol (ULTRAM) 50 MG tablet Take 1 tablet (50 mg total) by mouth every 6 (six) hours as needed. 07/04/18   [name], MD  triamcinolone cream (KENALOG) 0.1 % Apply 1 application topically 2 (two) times daily. For 5 days, then twice daily as needed for rash and itching. 01/03/15   [name], MD  vitamin C (ASCORBIC ACID) 500 MG tablet Take 500 mg by mouth daily.    [provider]    Allergies    Lisinopril, Penicillins, Amoxicillin, and Ibuprofen  Review of Systems   Review of Systems  All other systems reviewed and are negative.   Physical Exam Updated Vital Signs BP (!) 143/98 (BP Location: Right Arm)   Pulse 84   Temp 98.4 F (36.9 C) (Oral)   Resp 18   Ht 5\' 9"  (1.753 m)   Wt 74.8 kg   SpO2 100%   BMI 24.37 kg/m   Physical Exam Vitals and nursing note reviewed.   69 year old male, resting comfortably and in no acute distress. Vital signs are normal. Oxygen saturation is 98%, which is normal. Head is normocephalic and atraumatic. PERRLA, EOMI. Oropharynx is clear.  There is mild edema of the nasal turbinates, no obvious rhinorrhea or purulent drainage. Neck is nontender and supple without adenopathy or JVD. Back is nontender and there is no CVA tenderness. Lungs are clear without rales, wheezes, or rhonchi. Chest is nontender. Heart has regular rate and rhythm without murmur. Abdomen is soft, flat, nontender without masses or hepatosplenomegaly and peristalsis is normoactive. Extremities have no cyanosis or edema, full range of motion is present. Skin is warm and dry without rash. Neurologic: Awake and alert, cranial nerves are intact, there  are no motor or sensory deficits.  ED Results / Procedures / Treatments    Procedures Procedures  Medications Ordered in ED Medications  dexamethasone (DECADRON) tablet 10 mg (has no administration in time range)    ED Course  I have reviewed the triage vital signs and the nursing notes.  MDM Rules/Calculators/A&P Chronic rhinitis which is not responding well to current treatment.  Old records are reviewed, and he has has had multiple ED visits with similar complaints.  His medications include cetirizine and Flonase.  I am reluctant to add pseudoephedrine with his history of hypertension.  He is given a single dose of dexamethasone and is referred to ENT for follow-up.  Final Clinical Impression(s) /  ED Diagnoses Final diagnoses:  Nasal congestion  Elevated blood pressure reading with diagnosis of hypertension    Rx / DC Orders ED Discharge Orders    None       Delora Fuel, MD 01/60/10 [phone]

## 2020-02-05 NOTE — Discharge Instructions (Addendum)
Continue to use your cetirizine and Flonase everyday.  Drink plenty of fluids.

## 2020-02-07 ENCOUNTER — Emergency Department (HOSPITAL_COMMUNITY)
Admission: EM | Admit: 2020-02-07 | Discharge: 2020-02-07 | Disposition: A | Discharge status: Home / Self Care | Payer: Medicare Other | Attending: Emergency Medicine | Admitting: Emergency Medicine

## 2020-02-07 ENCOUNTER — Other Ambulatory Visit: Payer: Self-pay

## 2020-02-07 ENCOUNTER — Encounter (HOSPITAL_COMMUNITY): Payer: Self-pay

## 2020-02-07 ENCOUNTER — Other Ambulatory Visit (HOSPITAL_COMMUNITY): Payer: Self-pay | Admitting: *Deleted

## 2020-02-07 ENCOUNTER — Ambulatory Visit: Payer: Medicare Other | Admitting: Family Medicine

## 2020-02-07 DIAGNOSIS — R0981 Nasal congestion: Secondary | ICD-10-CM | POA: Diagnosis present

## 2020-02-07 DIAGNOSIS — I5030 Unspecified diastolic (congestive) heart failure: Secondary | ICD-10-CM | POA: Diagnosis not present

## 2020-02-07 DIAGNOSIS — I110 Hypertensive heart disease with heart failure: Secondary | ICD-10-CM | POA: Diagnosis not present

## 2020-02-07 DIAGNOSIS — F17210 Nicotine dependence, cigarettes, uncomplicated: Secondary | ICD-10-CM | POA: Insufficient documentation

## 2020-02-07 DIAGNOSIS — Z7982 Long term (current) use of aspirin: Secondary | ICD-10-CM | POA: Diagnosis not present

## 2020-02-07 DIAGNOSIS — J3489 Other specified disorders of nose and nasal sinuses: Secondary | ICD-10-CM | POA: Diagnosis not present

## 2020-02-07 DIAGNOSIS — Z79899 Other long term (current) drug therapy: Secondary | ICD-10-CM | POA: Insufficient documentation

## 2020-02-07 MED ORDER — FLUTICASONE PROPIONATE 50 MCG/ACT NA SUSP
2.0000 | Freq: Every day | NASAL | 0 refills | Status: DC
Start: 2020-02-07 — End: 2020-03-17

## 2020-02-07 NOTE — Discharge Instructions (Signed)
At this time there does not appear to be the presence of an emergent medical condition, however there is always the potential for conditions to change. Please read and follow the below instructions.  Please return to the Emergency Department immediately for any new or worsening symptoms. Call [facility] to establish a primary care doctor. You may use the medication Flonase as prescribed to help with your symptoms.  Attempt to stop smoking as smoking cigarettes is likely worsening your runny nose.  Smoking cigarettes will lead to future health problems so attempt to stop as soon as possible.  Contact a health care provider if: You have a fever. Your symptoms are getting worse at home. Your symptoms are not responding to medicine. You develop new symptoms, especially a headache or nosebleed. You have any new/concerning or worsening symptoms.  Please read the additional information packets attached to your discharge summary.  Do not take your medicine if  develop an itchy rash, swelling in your mouth or lips, or difficulty breathing; call 911 and seek immediate emergency medical attention if this occurs.  Note: Portions of this text may have been transcribed using voice recognition software. Every effort was made to ensure accuracy; however, inadvertent computerized transcription errors may still be present.

## 2020-02-07 NOTE — ED Provider Notes (Signed)
[facility] EMERGENCY DEPARTMENT Provider Note   CSN: [id number] Arrival date & time: 02/07/20  0054     History Chief Complaint  Patient presents with  . Nasal Congestion    [name] is a 69 y.o. male history of schizophrenia, high cholesterol, hepatitis C, GERD, CHF, BPH.  Patient presents today for nasal congestion.  Patient reports that he is out of his Flonase and would like a refill.  He reports that his nasal congestion has improved over the past few days and during my exam he is asking to leave.  Denies fever/chills, sore throat, cough, fall/injuries or any additional concerns. HPI     Past Medical History:  Diagnosis Date  . BPH (benign prostatic hyperplasia)   . Colon polyp 2010  . Depression   . GERD (gastroesophageal reflux disease)   . Hepatitis C    "caught it when I had a blood transfusion"  . High cholesterol   . History of blood transfusion    "when I was young"  . Hypertension   . Paranoid schizophrenia ([place])   . Prostate atrophy   . Retinal vein occlusion     Patient Active Problem List   Diagnosis Date Noted  . GERD (gastroesophageal reflux disease) 05/03/2019  . Hypotension 08/02/2018  . Low back sprain, initial encounter 10/21/2017  . History of drug abuse ([place]) 10/18/2017  . Gingival erythema 09/23/2017  . Chronic pain of left knee 11/24/2016  . Tobacco abuse   . Diastolic heart failure ([place]) 02/05/2015  . Healthcare maintenance 05/28/2014  . Hyperlipidemia 03/10/2014  . Patellar tendonitis 11/16/2012  . [place] OCCLUSION 06/03/2010  . PARANOID SCHIZOPHRENIA, CHRONIC 01/17/2009  . HYPERTENSION, BENIGN ESSENTIAL 01/17/2009  . BPH (benign prostatic hyperplasia) 01/17/2009  . HEPATITIS C 12/14/2008  . DEPRESSION 12/14/2008    Past Surgical History:  Procedure Laterality Date  . CIRCUMCISION  1959  . TONSILLECTOMY         Family History  Problem Relation Age of Onset  . Hypertension Mother     . Diabetes Mother   . Stroke Mother     Social History   Tobacco Use  . Smoking status: Current Every Day Smoker    Packs/day: 0.50    Years: 48.00    Pack years: 24.00    Types: Cigarettes  . Smokeless tobacco: Never Used  Substance Use Topics  . Alcohol use: Yes  . Drug use: No    Home Medications Prior to Admission medications   Medication Sig Start Date End Date Taking? Authorizing Provider  acetaminophen (TYLENOL) 500 MG tablet Take 1 tablet (500 mg total) by mouth every 6 (six) hours as needed. 04/23/18   [name], [name], PA-C  amLODipine (NORVASC) 10 MG tablet Take 1 tablet (10 mg total) by mouth daily. 04/13/18   [name], DO  aspirin 81 MG tablet Take 1 tablet (81 mg total) by mouth daily. 09/24/14   [name], MD  atorvastatin (LIPITOR) 40 MG tablet Take 1 tablet (40 mg total) by mouth daily. 08/15/19   [name], DO  benztropine (COGENTIN) 1 MG tablet Take 1 tablet (1 mg total) by mouth 2 (two) times daily. 11/05/16   [name], MD  cetirizine (ZYRTEC ALLERGY) 10 MG tablet Take 1 tablet (10 mg total) by mouth daily. 02/01/20   [name], [name], PA-C  famotidine (PEPCID) 20 MG tablet Take 1 tablet (20 mg total) by mouth 2 (two) times daily. 05/02/19   Tammi Klippel,  Sherin, DO  fluPHENAZine decanoate (PROLIXIN) 25 MG/ML injection Inject 25 mg into the muscle every 14 (fourteen) days. Last dose 09/07/12    [provider]  fluticasone (FLONASE) 50 MCG/ACT nasal spray Place 2 sprays into both nostrils daily. 02/07/20   Nuala Alpha A, PA-C  losartan (COZAAR) 100 MG tablet Take 1 tablet (100 mg total) by mouth daily. 11/28/19   Tammi Klippel, Sherin, DO  mometasone (NASONEX) 50 MCG/ACT nasal spray Place 2 sprays into the nose daily. 12/29/19   [name], PA-C  Multiple Vitamin (MULTIVITAMIN WITH MINERALS) TABS tablet Take 1 tablet by mouth daily. 06/28/14   [name], MD  sodium chloride (OCEAN) 0.65 % SOLN nasal spray Place 1 spray into both  nostrils as needed for congestion. 11/19/19   [name], PA-C  tamsulosin (FLOMAX) 0.4 MG CAPS capsule Take 2 capsules (0.8 mg total) by mouth daily. 11/27/17   [name], MD  tamsulosin (FLOMAX) 0.4 MG CAPS capsule TAKE 2 CAPSULE BY MOUTH DAILY 01/09/20   [name], DO  traMADol (ULTRAM) 50 MG tablet Take 1 tablet (50 mg total) by mouth every 6 (six) hours as needed. 07/04/18   [name], MD  triamcinolone cream (KENALOG) 0.1 % Apply 1 application topically 2 (two) times daily. For 5 days, then twice daily as needed for rash and itching. 01/03/15   [name], MD  vitamin C (ASCORBIC ACID) 500 MG tablet Take 500 mg by mouth daily.    [provider]  loratadine (CLARITIN) 10 MG tablet Take 1 tablet (10 mg total) by mouth daily. 12/28/19 02/05/20  [name], DO    Allergies    Lisinopril, Penicillins, Amoxicillin, and Ibuprofen  Review of Systems   Review of Systems  Constitutional: Negative.  Negative for chills and fever.  HENT: Positive for rhinorrhea (Improved). Negative for ear pain, sore throat and trouble swallowing.   Respiratory: Negative.  Negative for cough.     Physical Exam Updated Vital Signs BP (!) 165/92 (BP Location: Left Arm)   Pulse 63   Temp 97.7 F (36.5 C) (Oral)   Resp 16   SpO2 100%   Physical Exam Constitutional:      General: He is not in acute distress.    Appearance: Normal appearance. He is well-developed. He is not ill-appearing or diaphoretic.  HENT:     Head: Normocephalic and atraumatic.     Jaw: There is normal jaw occlusion. No trismus.     Right Ear: Tympanic membrane and external ear normal.     Left Ear: Tympanic membrane and external ear normal.     Nose: Rhinorrhea (Minimal) present. No signs of injury. Rhinorrhea is clear.     Right Sinus: No maxillary sinus tenderness or frontal sinus tenderness.     Left Sinus: No maxillary sinus tenderness or frontal sinus tenderness.     Mouth/Throat:      Mouth: Mucous membranes are moist.     Pharynx: Oropharynx is clear. Uvula midline.  Eyes:     General: Vision grossly intact. Gaze aligned appropriately.     Extraocular Movements: Extraocular movements intact.     Pupils: Pupils are equal, round, and reactive to light.  Neck:     Trachea: Trachea and phonation normal. No tracheal tenderness or tracheal deviation.  Pulmonary:     Effort: Pulmonary effort is normal. No respiratory distress.  Abdominal:     General: There is no distension.     Palpations: Abdomen is soft.  Tenderness: There is no abdominal tenderness. There is no guarding or rebound.  Musculoskeletal:        General: Normal range of motion.     Cervical back: Normal range of motion and neck supple.  Skin:    General: Skin is warm and dry.  Neurological:     Mental Status: He is alert.     GCS: GCS eye subscore is 4. GCS verbal subscore is 5. GCS motor subscore is 6.     Comments: Speech is clear and goal oriented, follows commands Major Cranial nerves without deficit, no facial droop Moves extremities without ataxia, coordination intact Grip strength equal bilaterally Stable gait  Psychiatric:        Behavior: Behavior normal.     ED Results / Procedures / Treatments   Labs (all labs ordered are listed, but only abnormal results are displayed) Labs Reviewed - No data to display  EKG None  Radiology No results found.  Procedures Procedures (including critical care time)  Medications Ordered in ED Medications - No data to display  ED Course  I have reviewed the triage vital signs and the nursing notes.  Pertinent labs & imaging results that were available during my care of the patient were reviewed by me and considered in my medical decision making (see chart for details).    MDM Rules/Calculators/A&P                          Additional History Obtained: 1. Nursing notes from this visit. 2. Reviewed patient's EMR.  He has been seen for  nasal congestion 4 times in the past 1 week and 7 times in the month of June.  On my initial evaluation patient reports he is no longer having nasal congestion, he would like a refill of his Flonase and to be discharged.  He is well-appearing no acute distress.  There is no evidence of sinusitis, otitis, pharyngitis or other infections of the head/neck at this time.  No indication for antibiotics or imaging/blood work.  He has a stable gait, refill was provided no further work-up indicated at this time.  Patient denies any other complaints.  At this time there does not appear to be any evidence of an acute emergency medical condition and the patient appears stable for discharge with appropriate outpatient follow up. Diagnosis was discussed with patient who verbalizes understanding of care plan and is agreeable to discharge. I have discussed return precautions with patient who verbalizes understanding. Patient encouraged to follow-up with their PCP. All questions answered.  Patient's case discussed with Dr. Dina Rich who agrees with plan to discharge.  Note: Portions of this report may have been transcribed using voice recognition software. Every effort was made to ensure accuracy; however, inadvertent computerized transcription errors may still be present. Final Clinical Impression(s) / ED Diagnoses Final diagnoses:  Rhinorrhea    Rx / DC Orders ED Discharge Orders         Ordered    fluticasone (FLONASE) 50 MCG/ACT nasal spray  Daily     Discontinue  Reprint     02/07/20 0156           Deliah Boston, PA-C 02/07/20 0216    [name], MD 02/07/20 [phone]

## 2020-02-07 NOTE — ED Triage Notes (Signed)
Pt here for his normal nasal congestion

## 2020-02-08 ENCOUNTER — Encounter (HOSPITAL_COMMUNITY): Payer: Self-pay | Admitting: Emergency Medicine

## 2020-02-08 ENCOUNTER — Other Ambulatory Visit: Payer: Self-pay

## 2020-02-08 ENCOUNTER — Emergency Department (HOSPITAL_COMMUNITY)
Admission: EM | Admit: 2020-02-08 | Discharge: 2020-02-08 | Disposition: A | Discharge status: Home / Self Care | Payer: Medicare Other | Attending: Emergency Medicine | Admitting: Emergency Medicine

## 2020-02-08 DIAGNOSIS — Z5321 Procedure and treatment not carried out due to patient leaving prior to being seen by health care provider: Secondary | ICD-10-CM | POA: Diagnosis not present

## 2020-02-08 DIAGNOSIS — M25562 Pain in left knee: Secondary | ICD-10-CM | POA: Insufficient documentation

## 2020-02-08 NOTE — ED Notes (Signed)
Called for pt x3 with no response for room in ED.

## 2020-02-08 NOTE — ED Triage Notes (Signed)
Patient reports left nee today , no injury , ambulatory .

## 2020-02-09 ENCOUNTER — Emergency Department (HOSPITAL_COMMUNITY)
Admission: EM | Admit: 2020-02-09 | Discharge: 2020-02-09 | Disposition: A | Discharge status: Home / Self Care | Payer: Medicare Other | Attending: Emergency Medicine | Admitting: Emergency Medicine

## 2020-02-09 ENCOUNTER — Other Ambulatory Visit: Payer: Self-pay

## 2020-02-09 ENCOUNTER — Encounter (HOSPITAL_COMMUNITY): Payer: Self-pay | Admitting: Emergency Medicine

## 2020-02-09 ENCOUNTER — Emergency Department (HOSPITAL_COMMUNITY)
Admission: EM | Admit: 2020-02-09 | Discharge: 2020-02-09 | Disposition: A | Discharge status: Home / Self Care | Payer: Medicare Other | Source: Home / Self Care | Attending: Emergency Medicine | Admitting: Emergency Medicine

## 2020-02-09 DIAGNOSIS — Z79899 Other long term (current) drug therapy: Secondary | ICD-10-CM | POA: Insufficient documentation

## 2020-02-09 DIAGNOSIS — I10 Essential (primary) hypertension: Secondary | ICD-10-CM | POA: Insufficient documentation

## 2020-02-09 DIAGNOSIS — Z139 Encounter for screening, unspecified: Secondary | ICD-10-CM

## 2020-02-09 DIAGNOSIS — Z7982 Long term (current) use of aspirin: Secondary | ICD-10-CM | POA: Insufficient documentation

## 2020-02-09 DIAGNOSIS — G8929 Other chronic pain: Secondary | ICD-10-CM

## 2020-02-09 DIAGNOSIS — R519 Headache, unspecified: Secondary | ICD-10-CM | POA: Insufficient documentation

## 2020-02-09 DIAGNOSIS — F17210 Nicotine dependence, cigarettes, uncomplicated: Secondary | ICD-10-CM | POA: Insufficient documentation

## 2020-02-09 DIAGNOSIS — Z765 Malingerer [conscious simulation]: Secondary | ICD-10-CM

## 2020-02-09 DIAGNOSIS — M25562 Pain in left knee: Secondary | ICD-10-CM | POA: Insufficient documentation

## 2020-02-09 NOTE — ED Notes (Addendum)
See EDP assessment. Pt verbalizes understanding of d/c instructions. Pt ambulatory at d/c with all belongings.

## 2020-02-09 NOTE — ED Provider Notes (Signed)
[facility] EMERGENCY DEPARTMENT Provider Note   CSN: [id number] Arrival date & time: 02/09/20  2248     History Chief Complaint  Patient presents with  . Knee Pain    [name] is a 69 y.o. male.  Patient to ED for chronic knee pain. No new injury. No other complaints. He has not taken anything at home for symptoms.   The history is provided by the patient. No language interpreter was used.  Knee Pain      Past Medical History:  Diagnosis Date  . BPH (benign prostatic hyperplasia)   . Colon polyp 2010  . Depression   . GERD (gastroesophageal reflux disease)   . Hepatitis C    "caught it when I had a blood transfusion"  . High cholesterol   . History of blood transfusion    "when I was young"  . Hypertension   . Paranoid schizophrenia ([place])   . Prostate atrophy   . Retinal vein occlusion     Patient Active Problem List   Diagnosis Date Noted  . GERD (gastroesophageal reflux disease) 05/03/2019  . Hypotension 08/02/2018  . Low back sprain, initial encounter 10/21/2017  . History of drug abuse ([place]) 10/18/2017  . Gingival erythema 09/23/2017  . Chronic pain of left knee 11/24/2016  . Tobacco abuse   . Diastolic heart failure ([place]) 02/05/2015  . Healthcare maintenance 05/28/2014  . Hyperlipidemia 03/10/2014  . Patellar tendonitis 11/16/2012  . [place] OCCLUSION 06/03/2010  . PARANOID SCHIZOPHRENIA, CHRONIC 01/17/2009  . HYPERTENSION, BENIGN ESSENTIAL 01/17/2009  . BPH (benign prostatic hyperplasia) 01/17/2009  . HEPATITIS C 12/14/2008  . DEPRESSION 12/14/2008    Past Surgical History:  Procedure Laterality Date  . CIRCUMCISION  1959  . TONSILLECTOMY         Family History  Problem Relation Age of Onset  . Hypertension Mother   . Diabetes Mother   . Stroke Mother     Social History   Tobacco Use  . Smoking status: Current Every Day Smoker    Packs/day: 0.50    Years: 48.00    Pack years: 24.00    Types:  Cigarettes  . Smokeless tobacco: Never Used  Substance Use Topics  . Alcohol use: Yes  . Drug use: No    Home Medications Prior to Admission medications   Medication Sig Start Date End Date Taking? Authorizing Provider  acetaminophen (TYLENOL) 500 MG tablet Take 1 tablet (500 mg total) by mouth every 6 (six) hours as needed. 04/23/18   [name], [name], PA-C  amLODipine (NORVASC) 10 MG tablet Take 1 tablet (10 mg total) by mouth daily. 04/13/18   [name], DO  aspirin 81 MG tablet Take 1 tablet (81 mg total) by mouth daily. 09/24/14   [name], MD  atorvastatin (LIPITOR) 40 MG tablet Take 1 tablet (40 mg total) by mouth daily. 08/15/19   [name], DO  benztropine (COGENTIN) 1 MG tablet Take 1 tablet (1 mg total) by mouth 2 (two) times daily. 11/05/16   [name], MD  cetirizine (ZYRTEC ALLERGY) 10 MG tablet Take 1 tablet (10 mg total) by mouth daily. 02/01/20   [name], [name], PA-C  famotidine (PEPCID) 20 MG tablet Take 1 tablet (20 mg total) by mouth 2 (two) times daily. 05/02/19   [name], DO  fluPHENAZine decanoate (PROLIXIN) 25 MG/ML injection Inject 25 mg into the muscle every 14 (fourteen) days. Last dose 09/07/12    [provider]  fluticasone (FLONASE) 50 MCG/ACT nasal spray Place 2 sprays into both nostrils daily. 02/07/20   Nuala Alpha A, PA-C  losartan (COZAAR) 100 MG tablet Take 1 tablet (100 mg total) by mouth daily. 11/28/19   Tammi Klippel, Sherin, DO  mometasone (NASONEX) 50 MCG/ACT nasal spray Place 2 sprays into the nose daily. 12/29/19   [name], PA-C  Multiple Vitamin (MULTIVITAMIN WITH MINERALS) TABS tablet Take 1 tablet by mouth daily. 06/28/14   [name], MD  sodium chloride (OCEAN) 0.65 % SOLN nasal spray Place 1 spray into both nostrils as needed for congestion. 11/19/19   [name], PA-C  tamsulosin (FLOMAX) 0.4 MG CAPS capsule Take 2 capsules (0.8 mg total) by mouth daily. 11/27/17   [name], MD    tamsulosin (FLOMAX) 0.4 MG CAPS capsule TAKE 2 CAPSULE BY MOUTH DAILY 01/09/20   [name], DO  traMADol (ULTRAM) 50 MG tablet Take 1 tablet (50 mg total) by mouth every 6 (six) hours as needed. 07/04/18   [name], MD  triamcinolone cream (KENALOG) 0.1 % Apply 1 application topically 2 (two) times daily. For 5 days, then twice daily as needed for rash and itching. 01/03/15   [name], MD  vitamin C (ASCORBIC ACID) 500 MG tablet Take 500 mg by mouth daily.    [provider]  loratadine (CLARITIN) 10 MG tablet Take 1 tablet (10 mg total) by mouth daily. 12/28/19 02/05/20  [name], DO    Allergies    Lisinopril, Penicillins, Amoxicillin, and Ibuprofen  Review of Systems   Review of Systems  Musculoskeletal:       Chronic knee pain  Skin: Negative for wound.  Neurological: Negative for weakness.    Physical Exam Updated Vital Signs BP (!) 178/98 (BP Location: Left Arm)   Pulse 69   Temp 98.7 F (37.1 C) (Oral)   Resp 18   SpO2 100%   Physical Exam Vitals and nursing note reviewed.  Constitutional:      Appearance: He is well-developed.  Cardiovascular:     Pulses: Normal pulses.  Pulmonary:     Effort: Pulmonary effort is normal.  Musculoskeletal:        General: Normal range of motion.     Cervical back: Normal range of motion.     Comments: There is no swelling, redness or deformity of the left knee. The patient is ambulatory, fully weight bearing.   Skin:    General: Skin is warm and dry.  Neurological:     Mental Status: He is alert and oriented to person, place, and time.     ED Results / Procedures / Treatments   Labs (all labs ordered are listed, but only abnormal results are displayed) Labs Reviewed - No data to display  EKG None  Radiology No results found.  Procedures Procedures (including critical care time)  Medications Ordered in ED Medications - No data to display  ED Course  I have reviewed the triage vital  signs and the nursing notes.  Pertinent labs & imaging results that were available during my care of the patient were reviewed by me and considered in my medical decision making (see chart for details).    MDM Rules/Calculators/A&P                          Patient to ED for the 70th visit in 6 months and the 23rd visit this month. He reports left  knee pain. This is a chronic complaint for this patient.   No new findings on exam to suggest emergency medical need or serious condition. Referred to his doctor for care of chronic, nonemergency complaints.   Final Clinical Impression(s) / ED Diagnoses Final diagnoses:  None   1. Chronic left knee pain 2. Malingering  Rx / DC Orders ED Discharge Orders    None       Dennie Bible 02/09/20 2332    Mesner, Corene Cornea, MD 02/10/20 [phone]

## 2020-02-09 NOTE — ED Triage Notes (Signed)
Patient states that he is having left knee pain and sinus pain.  Patient is CAOx4, ambulatory.

## 2020-02-09 NOTE — Discharge Instructions (Addendum)
Please see your doctor for chronic, nonemergency needs.

## 2020-02-09 NOTE — ED Provider Notes (Signed)
[facility] EMERGENCY DEPARTMENT Provider Note   CSN: [id number] Arrival date & time: 02/09/20  0016     History Chief Complaint  Patient presents with  . Knee Pain  . Facial Pain    [name] is a 69 y.o. male.  The history is provided by the patient and medical records.   69 year old male with history of BPH, depression, GERD, hepatitis C, hyperlipidemia, hypertension, schizophrenia, presenting to the ED for reported knee and sinus pain.  When I asked patient what brought him in he states "I came to see you".  He did get a cup of tea at [employer].  States his nose is actually feeling better.  He is ambulatory here in the ED.  Patient without further complaints.  Past Medical History:  Diagnosis Date  . BPH (benign prostatic hyperplasia)   . Colon polyp 2010  . Depression   . GERD (gastroesophageal reflux disease)   . Hepatitis C    "caught it when I had a blood transfusion"  . High cholesterol   . History of blood transfusion    "when I was young"  . Hypertension   . Paranoid schizophrenia ([place])   . Prostate atrophy   . Retinal vein occlusion     Patient Active Problem List   Diagnosis Date Noted  . GERD (gastroesophageal reflux disease) 05/03/2019  . Hypotension 08/02/2018  . Low back sprain, initial encounter 10/21/2017  . History of drug abuse ([place]) 10/18/2017  . Gingival erythema 09/23/2017  . Chronic pain of left knee 11/24/2016  . Tobacco abuse   . Diastolic heart failure ([place]) 02/05/2015  . Healthcare maintenance 05/28/2014  . Hyperlipidemia 03/10/2014  . Patellar tendonitis 11/16/2012  . [place] OCCLUSION 06/03/2010  . PARANOID SCHIZOPHRENIA, CHRONIC 01/17/2009  . HYPERTENSION, BENIGN ESSENTIAL 01/17/2009  . BPH (benign prostatic hyperplasia) 01/17/2009  . HEPATITIS C 12/14/2008  . DEPRESSION 12/14/2008    Past Surgical History:  Procedure Laterality Date  . CIRCUMCISION  1959  . TONSILLECTOMY         Family  History  Problem Relation Age of Onset  . Hypertension Mother   . Diabetes Mother   . Stroke Mother     Social History   Tobacco Use  . Smoking status: Current Every Day Smoker    Packs/day: 0.50    Years: 48.00    Pack years: 24.00    Types: Cigarettes  . Smokeless tobacco: Never Used  Substance Use Topics  . Alcohol use: Yes  . Drug use: No    Home Medications Prior to Admission medications   Medication Sig Start Date End Date Taking? Authorizing Provider  acetaminophen (TYLENOL) 500 MG tablet Take 1 tablet (500 mg total) by mouth every 6 (six) hours as needed. 04/23/18   [name], [name], PA-C  amLODipine (NORVASC) 10 MG tablet Take 1 tablet (10 mg total) by mouth daily. 04/13/18   [name], DO  aspirin 81 MG tablet Take 1 tablet (81 mg total) by mouth daily. 09/24/14   [name], MD  atorvastatin (LIPITOR) 40 MG tablet Take 1 tablet (40 mg total) by mouth daily. 08/15/19   [name], DO  benztropine (COGENTIN) 1 MG tablet Take 1 tablet (1 mg total) by mouth 2 (two) times daily. 11/05/16   [name], MD  cetirizine (ZYRTEC ALLERGY) 10 MG tablet Take 1 tablet (10 mg total) by mouth daily. 02/01/20   [name], [name], PA-C  famotidine (PEPCID) 20 MG tablet  Take 1 tablet (20 mg total) by mouth 2 (two) times daily. 05/02/19   [name], DO  fluPHENAZine decanoate (PROLIXIN) 25 MG/ML injection Inject 25 mg into the muscle every 14 (fourteen) days. Last dose 09/07/12    [provider]  fluticasone (FLONASE) 50 MCG/ACT nasal spray Place 2 sprays into both nostrils daily. 02/07/20   Nuala Alpha A, PA-C  losartan (COZAAR) 100 MG tablet Take 1 tablet (100 mg total) by mouth daily. 11/28/19   Tammi Klippel, Sherin, DO  mometasone (NASONEX) 50 MCG/ACT nasal spray Place 2 sprays into the nose daily. 12/29/19   [name], PA-C  Multiple Vitamin (MULTIVITAMIN WITH MINERALS) TABS tablet Take 1 tablet by mouth daily. 06/28/14   [name], MD  sodium  chloride (OCEAN) 0.65 % SOLN nasal spray Place 1 spray into both nostrils as needed for congestion. 11/19/19   [name], PA-C  tamsulosin (FLOMAX) 0.4 MG CAPS capsule Take 2 capsules (0.8 mg total) by mouth daily. 11/27/17   [name], MD  tamsulosin (FLOMAX) 0.4 MG CAPS capsule TAKE 2 CAPSULE BY MOUTH DAILY 01/09/20   [name], DO  traMADol (ULTRAM) 50 MG tablet Take 1 tablet (50 mg total) by mouth every 6 (six) hours as needed. 07/04/18   [name], MD  triamcinolone cream (KENALOG) 0.1 % Apply 1 application topically 2 (two) times daily. For 5 days, then twice daily as needed for rash and itching. 01/03/15   [name], MD  vitamin C (ASCORBIC ACID) 500 MG tablet Take 500 mg by mouth daily.    [provider]  loratadine (CLARITIN) 10 MG tablet Take 1 tablet (10 mg total) by mouth daily. 12/28/19 02/05/20  [name], DO    Allergies    Lisinopril, Penicillins, Amoxicillin, and Ibuprofen  Review of Systems   Review of Systems  Constitutional:       No specific complaints  All other systems reviewed and are negative.   Physical Exam Updated Vital Signs BP (!) 160/102 (BP Location: Left Arm)   Pulse 70   Temp 98.4 F (36.9 C) (Oral)   Resp 16   SpO2 100%   Physical Exam Vitals and nursing note reviewed.  Constitutional:      Appearance: He is well-developed.     Comments: sitting on stretcher, drinking tea  HENT:     Head: Normocephalic and atraumatic.     Mouth/Throat:     Comments: Poor dentition Eyes:     Conjunctiva/sclera: Conjunctivae normal.     Pupils: Pupils are equal, round, and reactive to light.  Cardiovascular:     Rate and Rhythm: Normal rate and regular rhythm.     Heart sounds: Normal heart sounds.  Pulmonary:     Effort: Pulmonary effort is normal.     Breath sounds: Normal breath sounds.  Abdominal:     General: Bowel sounds are normal.     Palpations: Abdomen is soft.  Musculoskeletal:        General:  Normal range of motion.     Cervical back: Normal range of motion.     Comments: Knees without noted deformity, no signs of acute trauma, ambulatory here without issue  Skin:    General: Skin is warm and dry.  Neurological:     Mental Status: He is alert and oriented to person, place, and time.     ED Results / Procedures / Treatments   Labs (all labs ordered are listed, but only abnormal results are  displayed) Labs Reviewed - No data to display  EKG None  Radiology No results found.  Procedures Procedures (including critical care time)  Medications Ordered in ED Medications - No data to display  ED Course  I have reviewed the triage vital signs and the nursing notes.  Pertinent labs & imaging results that were available during my care of the patient were reviewed by me and considered in my medical decision making (see chart for details).    MDM Rules/Calculators/A&P  69 year old male presenting to the ED with no specific complaints (triage note reports sinus pressure and knee pain).  When I talk to him and asked what brought him and he states "to see me here".  He has gotten cup of tea from primary.  States his sinuses are actually better.  He has no signs of acute trauma to the knees on exam, he remains ambulatory without difficulty.  Patient well-known to this facility, 69 visits in the past few months.  Patient appears at his baseline.  Feel he is stable for discharge home.  Final Clinical Impression(s) / ED Diagnoses Final diagnoses:  Encounter for medical screening examination    Rx / DC Orders ED Discharge Orders    None       [name], PA-C 02/09/20 0346    Mesner, Corene Cornea, MD 02/09/20 [phone]

## 2020-02-09 NOTE — ED Triage Notes (Signed)
Pt c/o L knee pain. Seen multiple times for same

## 2020-02-10 ENCOUNTER — Other Ambulatory Visit: Payer: Self-pay

## 2020-02-10 ENCOUNTER — Ambulatory Visit (HOSPITAL_COMMUNITY)
Admission: EM | Admit: 2020-02-10 | Discharge: 2020-02-10 | Disposition: A | Discharge status: Home / Self Care | Payer: Medicare Other

## 2020-02-10 DIAGNOSIS — M25569 Pain in unspecified knee: Secondary | ICD-10-CM | POA: Insufficient documentation

## 2020-02-10 DIAGNOSIS — Z590 Homelessness: Secondary | ICD-10-CM | POA: Insufficient documentation

## 2020-02-10 DIAGNOSIS — Z7982 Long term (current) use of aspirin: Secondary | ICD-10-CM | POA: Diagnosis not present

## 2020-02-10 DIAGNOSIS — I5030 Unspecified diastolic (congestive) heart failure: Secondary | ICD-10-CM | POA: Diagnosis not present

## 2020-02-10 DIAGNOSIS — I110 Hypertensive heart disease with heart failure: Secondary | ICD-10-CM | POA: Diagnosis not present

## 2020-02-10 DIAGNOSIS — G8929 Other chronic pain: Secondary | ICD-10-CM | POA: Insufficient documentation

## 2020-02-10 DIAGNOSIS — F17210 Nicotine dependence, cigarettes, uncomplicated: Secondary | ICD-10-CM | POA: Insufficient documentation

## 2020-02-10 DIAGNOSIS — Z79899 Other long term (current) drug therapy: Secondary | ICD-10-CM | POA: Insufficient documentation

## 2020-02-10 DIAGNOSIS — F259 Schizoaffective disorder, unspecified: Secondary | ICD-10-CM

## 2020-02-10 NOTE — ED Provider Notes (Addendum)
Behavioral Health Medical Screening Exam  [name] is a 69 y.o. male who presents voluntarily to [facility] States "My best friend, [name], told me to come here for medication management. He denies SI, HI, and AVH. He has an appointment scheduled for July 8 for medication management. Patient was provided with AVS with appointment reminders. He was evaluated in the ED for knee pain prior to coming to [facility].   Total Time spent with patient: 15 minutes  Psychiatric Specialty Exam  Presentation  General Appearance:Casual;Fairly Groomed  Eye Contact:Fair  Speech:Garbled  Speech Volume:Normal  Handedness:No data recorded  Mood and Affect  Mood:Anxious;Depressed  Affect:Blunt   Thought Process  Thought Processes:Goal Directed;Linear  Descriptions of Associations:Intact  Orientation:Full (Time, Place and Person)  Thought Content:Logical  Hallucinations:None  Ideas of Reference:None  Suicidal Thoughts:No  Homicidal Thoughts:No   Sensorium  Memory:Immediate Fair;Recent Fair  Judgment:Intact  Insight:Fair   Executive Functions  Concentration:Good  Attention Span:Good  [place]   Psychomotor Activity  Psychomotor Activity:Normal   Assets  Assets:Financial Resources/Insurance;Housing;Leisure Time   Sleep  Sleep:Fair  Number of hours: No data recorded  Physical Exam: Physical Exam Constitutional:      General: He is not in acute distress.    Appearance: He is not ill-appearing, toxic-appearing or diaphoretic.  HENT:     Head: Normocephalic.  Eyes:     Pupils: Pupils are equal, round, and reactive to light.  Cardiovascular:     Rate and Rhythm: Normal rate.  Pulmonary:     Effort: Pulmonary effort is normal. No respiratory distress.  Neurological:     Mental Status: He is alert and oriented to person, place, and time.    Review of Systems  Constitutional: Negative for chills, diaphoresis,  fever, malaise/fatigue and weight loss.  Respiratory: Negative for cough and shortness of breath.   Cardiovascular: Negative for chest pain.  Gastrointestinal: Negative for diarrhea, nausea and vomiting.  Neurological: Negative for dizziness.  Psychiatric/Behavioral: Positive for depression. Negative for hallucinations, memory loss, substance abuse and suicidal ideas. The patient is nervous/anxious and has insomnia.    Blood pressure (!) 166/96, pulse 63, temperature 97.8 F (36.6 C), temperature source Oral, resp. rate 18, SpO2 100 %. There is no height or weight on file to calculate BMI.  Musculoskeletal: Strength & Muscle Tone: within normal limits Gait & Station: normal Patient leans: N/A   Recommendations:  Based on my evaluation the patient does not appear to have an emergency medical condition.  Rozetta Nunnery, NP 02/10/2020, 3:33 AM

## 2020-02-10 NOTE — ED Notes (Signed)
Pt's items  Secured in locker 31.

## 2020-02-10 NOTE — BH Assessment (Signed)
Comprehensive Clinical Assessment (CCA) Note  02/10/2020 [name] [id number]  Visit Diagnosis:      ICD-10-CM   1. Schizoaffective disorder, unspecified type ([place])  F25.9     Pt presents voluntary and unaccompanied to [facility]. Clinician asked the pt, "what brought you to the hospital?" Pt reported, the other day, he ran into his best friend of 41 years, Corey Arbour, RN who recommend he come to [facility] for his outpatient needs. Pt reported, he is currently linked to [name] at [facility] for medication management but wants to receive OPT services at [facility]. Pt adamantly denies, SI, HI, AVH, self-injurious behaviors and access to weapons. Pt reported, he felt safe being discharged as he is not a danger to himself or others. Pt already had an appointment scheduled for 02/22/2020 to start OPT medication management. As pt was about to leave he asked if he could go upstairs, clinician expressed upstairs or OPT was closed will be open in the morning. Pt reported, he works Metallurgist work.   Pt consented for clinician to call his wife [name], [phone]) to obtain collateral information, however after a few rings clinician received the following message: "We're sorry but the call can not be completed at this time please hang up and try your call again."     Diagnosis: Schizoaffective disorder, Bipolar Type (HCC)  CCA Screening, Triage and Referral (STR)  Patient Reported Information How did you hear about [place]? Other (Comment) (Per pt Corey Arbour, RN told him to come to [facility].)  Referral name: Corey Arbour, RN.  Referral phone number: No data recorded  Whom do you see for routine medical problems? Primary Care  Practice/Facility Name: [facility]  Practice/Facility Phone Number: No data recorded Name of Contact: [name] [facility].  Contact Number: No data recorded Contact Fax Number: No data recorded Prescriber Name: No data recorded  Prescriber Address (if known): No data recorded  What Is the Reason for Your Visit/Call Today? No data recorded How Long Has This Been Causing You Problems? No data recorded What Do You Feel Would Help You the Most Today? Therapy;Medication   Have You Recently Been in Any Inpatient Treatment (Hospital/Detox/Crisis Center/28-Day Program)? No  Name/Location of Program/Hospital:No data recorded How Long Were You There? No data recorded When Were You Discharged? No data recorded  Have You Ever Received Services From [facility] Before? Yes  Who Do You See at [facility]? [facility]   Have You Recently Had Any Thoughts About Hurting Yourself? No (Pt denies.)  Are You Planning to Commit Suicide/Harm Yourself At This time? No   Have you Recently Had Thoughts About [place]? No  Explanation: No data recorded  Have You Used Any Alcohol or Drugs in the Past 24 Hours? No data recorded How Long Ago Did You Use Drugs or Alcohol? No data recorded What Did You Use and How Much? No data recorded  Do You Currently Have a Therapist/Psychiatrist? Yes  Name of Therapist/Psychiatrist: Dr. Josph Lowe at [facility] (psychiatrist)   Have You Been Recently Discharged From Any Office Practice or Programs? No data recorded Explanation of Discharge From Practice/Program: No data recorded    CCA Screening Triage Referral Assessment Type of Contact: Face-to-Face  Is this Initial or Reassessment? No data recorded Date Telepsych consult ordered in CHL:  No data recorded Time Telepsych consult ordered in CHL:  No data recorded  Patient Reported Information Reviewed? Yes  Patient Left Without Being Seen? No data recorded Reason  for Not Completing Assessment: No data recorded  Collateral Involvement: No data recorded  Does Patient Have a [place]? No data recorded Name and Contact of Legal Guardian: No data recorded If Minor and Not Living with Parent(s), Who has Custody? No  data recorded Is CPS involved or ever been involved? Never  Is APS involved or ever been involved? Never   Patient Determined To Be At Risk for Harm To Self or Others Based on Review of Patient Reported Information or Presenting Complaint? No (Pt denies.)  Method: No data recorded Availability of Means: No data recorded Intent: No data recorded Notification Required: No data recorded Additional Information for Danger to Others Potential: No data recorded Additional Comments for Danger to Others Potential: No data recorded Are There Guns or Other Weapons in Your Home? No data recorded Types of Guns/Weapons: No data recorded Are These Weapons Safely Secured?                            No data recorded Who Could Verify You Are Able To Have These Secured: No data recorded Do You Have any Outstanding Charges, Pending Court Dates, Parole/Probation? No data recorded Contacted To Inform of Risk of Harm To Self or Others: No data recorded  Location of Assessment: [facility] Assessment Services   Does Patient Present under Involuntary Commitment? No  IVC Papers Initial File Date: No data recorded  [place] of Residence: [place]   Patient Currently Receiving the Following Services: Medication Management   Determination of Need: Routine (7 days)   Options For Referral: Medication Management     CCA Biopsychosocial  Intake/Chief Complaint:  CCA Intake With Chief Complaint CCA Part Two Date: 02/10/20 CCA Part Two Time:  (1610) Chief Complaint/Presenting Problem: Pt wanting an appointment to [facility] for medication management. Patient's Currently Reported Symptoms/Problems: NA Individual's Strengths: UTA Individual's Preferences: UTA Individual's Abilities: UTA Type of Services Patient Feels Are Needed: UTA Initial Clinical Notes/Concerns: UTA  Mental Health Symptoms Depression:  Depression: Fatigue  Mania:  Mania: None  Anxiety:      Psychosis:  Psychosis: None (Pt denies.)   Trauma:  Trauma:  (UTA)  Obsessions:  Obsessions:  (UTA)  Compulsions:  Compulsions:  (UTA)  Inattention:  Inattention:  (UTA)  Hyperactivity/Impulsivity:  Hyperactivity/Impulsivity:  (UTA)  Oppositional/Defiant Behaviors:  Oppositional/Defiant Behaviors:  (UTA)  Emotional Irregularity:  Emotional Irregularity:  (UTA)  Other Mood/Personality Symptoms:      Mental Status Exam Appearance and self-care  Stature:     Weight:     Clothing:  Clothing: Disheveled  Grooming:     Cosmetic use:  Cosmetic Use: None  Posture/gait:     Motor activity:  Motor Activity: Not Remarkable  Sensorium  Attention:  Attention: Normal  Concentration:  Concentration: Normal  Orientation:  Orientation: Person, Place, Situation, Time  Recall/memory:  Recall/Memory:  (UTA)  Affect and Mood  Affect:  Affect: Other (Comment) (Pt has on mask.)  Mood:  Mood: Other (Comment) (Pleasant.)  Relating  Eye contact:  Eye Contact: Normal  Facial expression:  Facial Expression:  (Pt had on mask.)  Attitude toward examiner:  Attitude Toward Examiner: Cooperative  Thought and Language  Speech flow: Speech Flow: Garbled  Thought content:     Preoccupation:     Hallucinations:  Hallucinations: None  Organization:     Transport planner of Knowledge:  Fund of Knowledge:  (UTA)  Intelligence:     Abstraction:  Abstraction:  Pincus Badder)  Judgement:  Judgement: Good  Reality Testing:  Reality Testing:  Pincus Badder)  Insight:     Decision Making:  Decision Making:  4)  Social Functioning  Social Maturity:  Social Maturity:  Special educational needs teacher)  Social Judgement:  Social Judgement:  (UTA)  Stress  Stressors:  Stressors:  (UTA)  Coping Ability:  Coping Ability:  Special educational needs teacher)  Skill Deficits:  Skill Deficits:  (UTA)  Supports:  Supports:  (UTA)     Religion:    Leisure/Recreation:    Exercise/Diet:     CCA Employment/Education  Employment/Work Situation: Employment / Work Situation Employment situation: Employed Where is  patient currently employed?: Per pt, [facility]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 How long has patient been employed?: UTA Patient's job has been impacted by current illness:  (UTA) What is the longest time patient has a held a job?: UTA Where was the patient employed at that time?: UTA Has patient ever been in the [employer]?: No  Education: Education Is Patient Currently Attending School?: No Did Teacher, adult education From [employer]?: Yes Did Physicist, medical?: Yes What Type of College Degree Do you Have?: Pt reported, he completed two years of college. Did You Attend Graduate School?:  (UTA) What Was Your Major?: UTA Did You Have Any Special Interests In School?: UTA Did You Have An Individualized Education Program (IIEP):  (UTA) Did You Have Any Difficulty At School?:  (UTA) Patient's Education Has Been Impacted by Current Illness:  (UTA)   CCA Family/Childhood History  Family and Relationship History: Family history Marital status: Married Number of Years Married:  (UTA) What types of issues is patient dealing with in the relationship?:  (UTA) Additional relationship information:  (UTA) Are you sexually active?:  (UTA) What is your sexual orientation?: UTA Has your sexual activity been affected by drugs, alcohol, medication, or emotional stress?: UTA Does patient have children?:  (UTA)  Childhood History:  Childhood History By whom was/is the patient raised?:  (UTA) Additional childhood history information: UTA Description of patient's relationship with caregiver when they were a child: UTA Patient's description of current relationship with people who raised him/her: UTA How were you disciplined when you got in trouble as a child/adolescent?: UTA Does patient have siblings?: No (UTA) Did patient suffer any verbal/emotional/physical/sexual abuse as a child?: No (Pt denies.) Did patient suffer from severe childhood neglect?: No (Pt denies.) Has patient ever been sexually  abused/assaulted/raped as an adolescent or adult?: No (Pt denies.) Was the patient ever a victim of a crime or a disaster?: No (Pt denies.) Witnessed domestic violence?: No (Pt denies.) Has patient been affected by domestic violence as an adult?: No (Pt denies.)  Child/Adolescent Assessment:     CCA Substance Use  Alcohol/Drug Use: Alcohol / Drug Use Pain Medications: See MAR Prescriptions: See MAR Over the Counter: See MAR History of alcohol / drug use?: No history of alcohol / drug abuse (Pt denies.)     ASAM's:  Six Dimensions of Multidimensional Assessment  Dimension 1:  Acute Intoxication and/or Withdrawal Potential:      Dimension 2:  Biomedical Conditions and Complications:      Dimension 3:  Emotional, Behavioral, or Cognitive Conditions and Complications:     Dimension 4:  Readiness to Change:     Dimension 5:  Relapse, Continued use, or Continued Problem Potential:     Dimension 6:  Recovery/Living Environment:     ASAM Severity Score:    ASAM Recommended Level of Treatment:     Substance use  Disorder (SUD)  Recommendations for Services/Supports/Treatments:  DSM5 Diagnoses: Patient Active Problem List   Diagnosis Date Noted  . GERD (gastroesophageal reflux disease) 05/03/2019  . Hypotension 08/02/2018  . Low back sprain, initial encounter 10/21/2017  . History of drug abuse ([place]) 10/18/2017  . Gingival erythema 09/23/2017  . Chronic pain of left knee 11/24/2016  . Tobacco abuse   . Diastolic heart failure ([place]) 02/05/2015  . Healthcare maintenance 05/28/2014  . Hyperlipidemia 03/10/2014  . Patellar tendonitis 11/16/2012  . [place] OCCLUSION 06/03/2010  . PARANOID SCHIZOPHRENIA, CHRONIC 01/17/2009  . HYPERTENSION, BENIGN ESSENTIAL 01/17/2009  . BPH (benign prostatic hyperplasia) 01/17/2009  . HEPATITIS C 12/14/2008  . DEPRESSION 12/14/2008     Referrals to Alternative Service(s): Referred to Alternative Service(s):   Place:   Date:    Time:    Referred to Alternative Service(s):   Place:   Date:   Time:    Referred to Alternative Service(s):   Place:   Date:   Time:    Referred to Alternative Service(s):   Place:   Date:   Time:     Vertell Novak  Comprehensive Clinical Assessment (CCA) Screening, Triage and Referral Note  02/10/2020 [name] [id number]  Visit Diagnosis:    ICD-10-CM   1. Schizoaffective disorder, unspecified type ([place])  F25.9     Patient Reported Information How did you hear about [place]? Other (Comment) (Per pt Corey Arbour, RN told him to come to [facility].)   Referral name: Corey Arbour, RN.   Referral phone number: No data recorded Whom do you see for routine medical problems? Primary Care   Practice/Facility Name: [facility]   Practice/Facility Phone Number: No data recorded  Name of Contact: [name] [facility]   Contact Number: No data recorded  Contact Fax Number: No data recorded  Prescriber Name: No data recorded  Prescriber Address (if known): No data recorded What Is the Reason for Your Visit/Call Today? No data recorded How Long Has This Been Causing You Problems? No data recorded Have You Recently Been in Any Inpatient Treatment (Hospital/Detox/Crisis Center/28-Day Program)? No   Name/Location of Program/Hospital:No data recorded  How Long Were You There? No data recorded  When Were You Discharged? No data recorded Have You Ever Received Services From [facility] Before? Yes   Who Do You See at [facility]? [facility]  Have You Recently Had Any Thoughts About Hurting Yourself? No (Pt denies.)   Are You Planning to Commit Suicide/Harm Yourself At This time?  No  Have you Recently Had Thoughts About [place]? No   Explanation: No data recorded Have You Used Any Alcohol or Drugs in the Past 24 Hours? No data recorded  How Long Ago Did You Use Drugs or Alcohol?  No data recorded  What Did You Use and How Much? No data recorded What Do You Feel  Would Help You the Most Today? Therapy;Medication  Do You Currently Have a Therapist/Psychiatrist? Yes   Name of Therapist/Psychiatrist: Dr. Josph Lowe at [facility] (psychiatrist)   Have You Been Recently Discharged From Any Office Practice or Programs? No data recorded  Explanation of Discharge From Practice/Program:  No data recorded    CCA Screening Triage Referral Assessment Type of Contact: Face-to-Face   Is this Initial or Reassessment? No data recorded  Date Telepsych consult ordered in CHL:  No data recorded  Time Telepsych consult ordered in CHL:  No data recorded Patient Reported Information Reviewed? Yes   Patient  Left Without Being Seen? No data recorded  Reason for Not Completing Assessment: No data recorded Collateral Involvement: No data recorded Does Patient Have a [place]? No data recorded  Name and Contact of Legal Guardian:  No data recorded If Minor and Not Living with Parent(s), Who has Custody? No data recorded Is CPS involved or ever been involved? Never  Is APS involved or ever been involved? Never  Patient Determined To Be At Risk for Harm To Self or Others Based on Review of Patient Reported Information or Presenting Complaint? No (Pt denies.)   Method: No data recorded  Availability of Means: No data recorded  Intent: No data recorded  Notification Required: No data recorded  Additional Information for Danger to Others Potential:  No data recorded  Additional Comments for Danger to Others Potential:  No data recorded  Are There Guns or Other Weapons in Your Home?  No data recorded   Types of Guns/Weapons: No data recorded   Are These Weapons Safely Secured?                              No data recorded   Who Could Verify You Are Able To Have These Secured:    No data recorded Do You Have any Outstanding Charges, Pending Court Dates, Parole/Probation? No data recorded Contacted To Inform of Risk of Harm To Self or Others: No data  recorded Location of Assessment: [facility] Assessment Services  Does Patient Present under Involuntary Commitment? No   IVC Papers Initial File Date: No data recorded  [place] of Residence: [place]  Patient Currently Receiving the Following Services: Medication Management   Determination of Need: Routine (7 days)   Options For Referral: Medication Management  Disposition: Lindon Romp, NP recommends pt does not meet inpatient treatment criteria. Pt was given a copy of his upcoming OPT appointments at [facility].   Vertell Novak, [place], [place], [facility], [facility] Triage Specialist [phone]

## 2020-02-11 ENCOUNTER — Emergency Department (HOSPITAL_COMMUNITY)
Admission: EM | Admit: 2020-02-11 | Discharge: 2020-02-11 | Disposition: A | Discharge status: Home / Self Care | Payer: Medicare Other | Attending: Emergency Medicine | Admitting: Emergency Medicine

## 2020-02-11 ENCOUNTER — Encounter (HOSPITAL_COMMUNITY): Payer: Self-pay | Admitting: *Deleted

## 2020-02-11 DIAGNOSIS — M25569 Pain in unspecified knee: Secondary | ICD-10-CM

## 2020-02-11 DIAGNOSIS — G8929 Other chronic pain: Secondary | ICD-10-CM

## 2020-02-11 NOTE — ED Provider Notes (Signed)
[facility] EMERGENCY DEPARTMENT Provider Note   CSN: [id number] Arrival date & time: 02/10/20  2328     History Chief Complaint  Patient presents with  . Knee Pain    [name] is a 69 y.o. male.  The history is provided by the patient and medical records.  Knee Pain   69 y.o. male with history of BPH, depression, hepatitis C, hyperlipidemia, paranoid schizophrenia, hypertension, presenting to the ED with complaint of knee pain.  On my evaluation, he states he came to get a glass of tea.  Patient is well-known to this facility for similar, he is seen pretty much every night for same and gets tea from [employer].  72 visits in the past 68 months.  He has been sleeping in the lobby for the past several hours.  Past Medical History:  Diagnosis Date  . BPH (benign prostatic hyperplasia)   . Colon polyp 2010  . Depression   . GERD (gastroesophageal reflux disease)   . Hepatitis C    "caught it when I had a blood transfusion"  . High cholesterol   . History of blood transfusion    "when I was young"  . Hypertension   . Paranoid schizophrenia ([place])   . Prostate atrophy   . Retinal vein occlusion     Patient Active Problem List   Diagnosis Date Noted  . GERD (gastroesophageal reflux disease) 05/03/2019  . Hypotension 08/02/2018  . Low back sprain, initial encounter 10/21/2017  . History of drug abuse ([place]) 10/18/2017  . Gingival erythema 09/23/2017  . Chronic pain of left knee 11/24/2016  . Tobacco abuse   . Diastolic heart failure ([place]) 02/05/2015  . Healthcare maintenance 05/28/2014  . Hyperlipidemia 03/10/2014  . Patellar tendonitis 11/16/2012  . [place] OCCLUSION 06/03/2010  . PARANOID SCHIZOPHRENIA, CHRONIC 01/17/2009  . HYPERTENSION, BENIGN ESSENTIAL 01/17/2009  . BPH (benign prostatic hyperplasia) 01/17/2009  . HEPATITIS C 12/14/2008  . DEPRESSION 12/14/2008    Past Surgical History:  Procedure Laterality Date  .  CIRCUMCISION  1959  . TONSILLECTOMY         Family History  Problem Relation Age of Onset  . Hypertension Mother   . Diabetes Mother   . Stroke Mother     Social History   Tobacco Use  . Smoking status: Current Every Day Smoker    Packs/day: 0.50    Years: 48.00    Pack years: 24.00    Types: Cigarettes  . Smokeless tobacco: Never Used  Substance Use Topics  . Alcohol use: Yes  . Drug use: No    Home Medications Prior to Admission medications   Medication Sig Start Date End Date Taking? Authorizing Provider  acetaminophen (TYLENOL) 500 MG tablet Take 1 tablet (500 mg total) by mouth every 6 (six) hours as needed. 04/23/18   [name], [name], PA-C  amLODipine (NORVASC) 10 MG tablet Take 1 tablet (10 mg total) by mouth daily. 04/13/18   [name], DO  aspirin 81 MG tablet Take 1 tablet (81 mg total) by mouth daily. 09/24/14   [name], MD  atorvastatin (LIPITOR) 40 MG tablet Take 1 tablet (40 mg total) by mouth daily. 08/15/19   [name], DO  benztropine (COGENTIN) 1 MG tablet Take 1 tablet (1 mg total) by mouth 2 (two) times daily. 11/05/16   [name], MD  cetirizine (ZYRTEC ALLERGY) 10 MG tablet Take 1 tablet (10 mg total) by mouth daily. 02/01/20  [name], [name], PA-C  famotidine (PEPCID) 20 MG tablet Take 1 tablet (20 mg total) by mouth 2 (two) times daily. 05/02/19   [name], DO  fluPHENAZine decanoate (PROLIXIN) 25 MG/ML injection Inject 25 mg into the muscle every 14 (fourteen) days. Last dose 09/07/12    [provider]  fluticasone (FLONASE) 50 MCG/ACT nasal spray Place 2 sprays into both nostrils daily. 02/07/20   Nuala Alpha A, PA-C  losartan (COZAAR) 100 MG tablet Take 1 tablet (100 mg total) by mouth daily. 11/28/19   Tammi Klippel, Sherin, DO  mometasone (NASONEX) 50 MCG/ACT nasal spray Place 2 sprays into the nose daily. 12/29/19   [name], PA-C  Multiple Vitamin (MULTIVITAMIN WITH MINERALS) TABS tablet Take 1 tablet by  mouth daily. 06/28/14   [name], MD  sodium chloride (OCEAN) 0.65 % SOLN nasal spray Place 1 spray into both nostrils as needed for congestion. 11/19/19   [name], PA-C  tamsulosin (FLOMAX) 0.4 MG CAPS capsule Take 2 capsules (0.8 mg total) by mouth daily. 11/27/17   [name], MD  tamsulosin (FLOMAX) 0.4 MG CAPS capsule TAKE 2 CAPSULE BY MOUTH DAILY 01/09/20   [name], DO  traMADol (ULTRAM) 50 MG tablet Take 1 tablet (50 mg total) by mouth every 6 (six) hours as needed. 07/04/18   [name], MD  triamcinolone cream (KENALOG) 0.1 % Apply 1 application topically 2 (two) times daily. For 5 days, then twice daily as needed for rash and itching. 01/03/15   [name], MD  vitamin C (ASCORBIC ACID) 500 MG tablet Take 500 mg by mouth daily.    [provider]  loratadine (CLARITIN) 10 MG tablet Take 1 tablet (10 mg total) by mouth daily. 12/28/19 02/05/20  [name], DO    Allergies    Lisinopril, Penicillins, Amoxicillin, and Ibuprofen  Review of Systems   Review of Systems  Musculoskeletal: Positive for arthralgias.  All other systems reviewed and are negative.   Physical Exam Updated Vital Signs BP (!) 172/95 (BP Location: Left Arm)   Pulse 71   Temp 98.3 F (36.8 C) (Oral)   Resp 18   SpO2 100%   Physical Exam Vitals and nursing note reviewed.  Constitutional:      Appearance: He is well-developed.     Comments: Sleeping, awoken for exam Appears at baseline  HENT:     Head: Normocephalic and atraumatic.  Eyes:     Conjunctiva/sclera: Conjunctivae normal.     Pupils: Pupils are equal, round, and reactive to light.  Cardiovascular:     Rate and Rhythm: Normal rate and regular rhythm.     Heart sounds: Normal heart sounds.  Pulmonary:     Effort: Pulmonary effort is normal.     Breath sounds: Normal breath sounds.  Abdominal:     General: Bowel sounds are normal.     Palpations: Abdomen is soft.  Musculoskeletal:         General: Normal range of motion.     Cervical back: Normal range of motion.  Skin:    General: Skin is warm and dry.  Neurological:     Mental Status: He is alert and oriented to person, place, and time.     ED Results / Procedures / Treatments   Labs (all labs ordered are listed, but only abnormal results are displayed) Labs Reviewed - No data to display  EKG None  Radiology No results found.  Procedures Procedures (including critical care time)  Medications Ordered in ED Medications - No data to display  ED Course  I have reviewed the triage vital signs and the nursing notes.  Pertinent labs & imaging results that were available during my care of the patient were reviewed by me and considered in my medical decision making (see chart for details).    MDM Rules/Calculators/A&P  69 year old male well-known to this facility, here with complaint of knee pain.  He tells me he is here to get a glass of tea.  He is pretty much here every night for same.  He appears at his baseline.  He is ambulatory without difficulty.  No indication for emergent work-up.  Stable for discharge.  Final Clinical Impression(s) / ED Diagnoses Final diagnoses:  Chronic knee pain, unspecified laterality    Rx / DC Orders ED Discharge Orders    None       [name], PA-C 02/11/20 7680    Varney Biles, MD 02/14/20 1402

## 2020-02-11 NOTE — ED Notes (Signed)
Pt verbalizes understanding of d/c instructions. Pt ambulatory at d/c with all belongings.   

## 2020-02-11 NOTE — ED Triage Notes (Signed)
hes homeless and every night he comes to the ed to hang out he has numerus complaints tonight its knee pain

## 2020-02-12 ENCOUNTER — Encounter (HOSPITAL_COMMUNITY): Payer: Self-pay | Admitting: Emergency Medicine

## 2020-02-12 ENCOUNTER — Other Ambulatory Visit: Payer: Self-pay

## 2020-02-12 ENCOUNTER — Emergency Department (HOSPITAL_COMMUNITY)
Admission: EM | Admit: 2020-02-12 | Discharge: 2020-02-12 | Disposition: A | Discharge status: Home / Self Care | Payer: Medicare Other | Attending: Emergency Medicine | Admitting: Emergency Medicine

## 2020-02-12 ENCOUNTER — Emergency Department (HOSPITAL_COMMUNITY)
Admission: EM | Admit: 2020-02-12 | Discharge: 2020-02-13 | Disposition: A | Discharge status: Home / Self Care | Payer: Medicare Other | Source: Home / Self Care | Attending: Emergency Medicine | Admitting: Emergency Medicine

## 2020-02-12 DIAGNOSIS — Z7982 Long term (current) use of aspirin: Secondary | ICD-10-CM | POA: Insufficient documentation

## 2020-02-12 DIAGNOSIS — R0981 Nasal congestion: Secondary | ICD-10-CM | POA: Insufficient documentation

## 2020-02-12 DIAGNOSIS — M25562 Pain in left knee: Secondary | ICD-10-CM | POA: Diagnosis present

## 2020-02-12 DIAGNOSIS — F17210 Nicotine dependence, cigarettes, uncomplicated: Secondary | ICD-10-CM | POA: Insufficient documentation

## 2020-02-12 DIAGNOSIS — I10 Essential (primary) hypertension: Secondary | ICD-10-CM | POA: Diagnosis not present

## 2020-02-12 DIAGNOSIS — Z79899 Other long term (current) drug therapy: Secondary | ICD-10-CM | POA: Insufficient documentation

## 2020-02-12 DIAGNOSIS — G8929 Other chronic pain: Secondary | ICD-10-CM | POA: Insufficient documentation

## 2020-02-12 DIAGNOSIS — I5030 Unspecified diastolic (congestive) heart failure: Secondary | ICD-10-CM | POA: Insufficient documentation

## 2020-02-12 DIAGNOSIS — I110 Hypertensive heart disease with heart failure: Secondary | ICD-10-CM | POA: Insufficient documentation

## 2020-02-12 NOTE — ED Triage Notes (Signed)
Pt reports nasal congestion

## 2020-02-12 NOTE — ED Provider Notes (Signed)
TIME SEEN: 4:26 AM  CHIEF COMPLAINT: Left knee pain  HPI: Patient is a 69 year old male who presents to emergency department almost daily with complaints of left knee pain.  This is a chronic issue for him.  He denies any new injury.  He is ambulatory.  ROS: See HPI Constitutional: no fever  Eyes: no drainage  ENT: no runny nose   Cardiovascular:  no chest pain  Resp: no SOB  GI: no vomiting GU: no dysuria Integumentary: no rash  Allergy: no hives  Musculoskeletal: no leg swelling  Neurological: no slurred speech ROS otherwise negative  PAST MEDICAL HISTORY/PAST SURGICAL HISTORY:  Past Medical History:  Diagnosis Date  . BPH (benign prostatic hyperplasia)   . Colon polyp 2010  . Depression   . GERD (gastroesophageal reflux disease)   . Hepatitis C    "caught it when I had a blood transfusion"  . High cholesterol   . History of blood transfusion    "when I was young"  . Hypertension   . Paranoid schizophrenia ([place])   . Prostate atrophy   . Retinal vein occlusion     MEDICATIONS:  Prior to Admission medications   Medication Sig Start Date End Date Taking? Authorizing Provider  acetaminophen (TYLENOL) 500 MG tablet Take 1 tablet (500 mg total) by mouth every 6 (six) hours as needed. 04/23/18   [name], [name], PA-C  amLODipine (NORVASC) 10 MG tablet Take 1 tablet (10 mg total) by mouth daily. 04/13/18   [name], DO  aspirin 81 MG tablet Take 1 tablet (81 mg total) by mouth daily. 09/24/14   [name], MD  atorvastatin (LIPITOR) 40 MG tablet Take 1 tablet (40 mg total) by mouth daily. 08/15/19   [name], DO  benztropine (COGENTIN) 1 MG tablet Take 1 tablet (1 mg total) by mouth 2 (two) times daily. 11/05/16   [name], MD  cetirizine (ZYRTEC ALLERGY) 10 MG tablet Take 1 tablet (10 mg total) by mouth daily. 02/01/20   [name], [name], PA-C  famotidine (PEPCID) 20 MG tablet Take 1 tablet (20 mg total) by mouth 2 (two) times daily. 05/02/19   [name], DO  fluPHENAZine decanoate (PROLIXIN) 25 MG/ML injection Inject 25 mg into the muscle every 14 (fourteen) days. Last dose 09/07/12    [provider]  fluticasone (FLONASE) 50 MCG/ACT nasal spray Place 2 sprays into both nostrils daily. 02/07/20   Nuala Alpha A, PA-C  losartan (COZAAR) 100 MG tablet Take 1 tablet (100 mg total) by mouth daily. 11/28/19   Tammi Klippel, Sherin, DO  mometasone (NASONEX) 50 MCG/ACT nasal spray Place 2 sprays into the nose daily. 12/29/19   [name], PA-C  Multiple Vitamin (MULTIVITAMIN WITH MINERALS) TABS tablet Take 1 tablet by mouth daily. 06/28/14   [name], MD  sodium chloride (OCEAN) 0.65 % SOLN nasal spray Place 1 spray into both nostrils as needed for congestion. 11/19/19   [name], PA-C  tamsulosin (FLOMAX) 0.4 MG CAPS capsule Take 2 capsules (0.8 mg total) by mouth daily. 11/27/17   [name], MD  tamsulosin (FLOMAX) 0.4 MG CAPS capsule TAKE 2 CAPSULE BY MOUTH DAILY 01/09/20   [name], DO  traMADol (ULTRAM) 50 MG tablet Take 1 tablet (50 mg total) by mouth every 6 (six) hours as needed. 07/04/18   [name], MD  triamcinolone cream (KENALOG) 0.1 % Apply 1 application topically 2 (two) times daily. For 5 days, then twice daily as needed for  rash and itching. 01/03/15   [name], MD  vitamin C (ASCORBIC ACID) 500 MG tablet Take 500 mg by mouth daily.    [provider]  loratadine (CLARITIN) 10 MG tablet Take 1 tablet (10 mg total) by mouth daily. 12/28/19 02/05/20  [name], DO    ALLERGIES:  Allergies  Allergen Reactions  . Lisinopril Other (See Comments)    Cough  . Penicillins Hives  . Amoxicillin Rash  . Ibuprofen Rash    SOCIAL HISTORY:  Social History   Tobacco Use  . Smoking status: Current Every Day Smoker    Packs/day: 0.50    Years: 48.00    Pack years: 24.00    Types: Cigarettes  . Smokeless tobacco: Never Used  Substance Use Topics  . Alcohol use: Yes     FAMILY HISTORY: Family History  Problem Relation Age of Onset  . Hypertension Mother   . Diabetes Mother   . Stroke Mother     EXAM: BP (!) 158/104 (BP Location: Right Arm)   Pulse 82   Temp 98.1 F (36.7 C) (Oral)   Resp 16   Ht 5\' 9"  (1.753 m)   Wt 74.8 kg   SpO2 100%   BMI 24.37 kg/m  CONSTITUTIONAL: Alert and responds appropriately to questions. Well-appearing; well-nourished HEAD: Normocephalic, atraumatic EYES: Conjunctivae clear, pupils appear equal ENT: normal nose; moist mucous membranes NECK: Normal range of motion CARD: Regular rate and rhythm RESP: Normal chest excursion without splinting or tachypnea; no hypoxia or respiratory distress, speaking full sentences ABD/GI: non-distended EXT: Normal ROM in all joints, no major deformities noted, no tenderness to palpation over either knee and no deformity noted, no joint effusion, no redness or warmth, compartments of bilateral lower extremities are soft, no ligamentous laxity, extremities are warm and well-perfused SKIN: Normal color for age and race, no rashes on exposed skin NEURO: Moves all extremities equally, normal speech, no facial asymmetry noted, ambulates without difficulty or antalgic gait PSYCH: The patient's mood and manner are appropriate. Grooming and personal hygiene are appropriate.  MEDICAL DECISION MAKING: Patient here with chronic knee pain.  Here frequently for the same.  No new injury.  No sign of fracture, dislocation, septic arthritis, gout, cellulitis, compartment syndrome, arterial obstruction, DVT or other acute abnormality on exam.  Will discharge without further work-up.  No emergent condition present.  At this time, I do not feel there is any life-threatening condition present. I have reviewed, interpreted and discussed all results (EKG, imaging, lab, urine as appropriate) and exam findings with patient/family. I have reviewed nursing notes and appropriate previous records.  I feel the  patient is safe to be discharged home without further emergent workup and can continue workup as an outpatient as needed. Discussed usual and customary return precautions. Patient/family verbalize understanding and are comfortable with this plan.  Outpatient follow-up has been provided as needed. All questions have been answered.   [name] was evaluated in Emergency Department on 02/12/2020 for the symptoms described in the history of present illness. He was evaluated in the context of the global COVID-19 pandemic, which necessitated consideration that the patient might be at risk for infection with the SARS-CoV-2 virus that causes COVID-19. Institutional protocols and algorithms that pertain to the evaluation of patients at risk for COVID-19 are in a state of rapid change based on information released by regulatory bodies including the CDC and federal and state organizations. These policies and algorithms were followed during the patient's care  in the ED.      Cybele Maule, Delice Bison, DO 02/12/20 [phone]

## 2020-02-12 NOTE — ED Notes (Signed)
Patient verbalizes understanding of discharge instructions. Opportunity for questioning and answers were provided. Armband removed by staff, pt discharged from ED. Ambulated out to the lobby, escorted by security

## 2020-02-12 NOTE — ED Notes (Signed)
Pt keeps running in and out of ED lobby doors.

## 2020-02-12 NOTE — Discharge Instructions (Signed)
You may alternate Tylenol 1000 mg every 6 hours as needed for pain, fever and Ibuprofen 800 mg every 8 hours as needed for pain, fever.  Please take Ibuprofen with food.  Do not take more than 4000 mg of Tylenol (acetaminophen) in a 24 hour period.  

## 2020-02-12 NOTE — ED Triage Notes (Signed)
Pt reports left knee pain.

## 2020-02-13 ENCOUNTER — Other Ambulatory Visit: Payer: Self-pay

## 2020-02-13 ENCOUNTER — Encounter (HOSPITAL_COMMUNITY): Payer: Self-pay | Admitting: Emergency Medicine

## 2020-02-13 ENCOUNTER — Emergency Department (HOSPITAL_COMMUNITY)
Admission: EM | Admit: 2020-02-13 | Discharge: 2020-02-14 | Disposition: A | Discharge status: Home / Self Care | Payer: Medicare Other | Attending: Emergency Medicine | Admitting: Emergency Medicine

## 2020-02-13 DIAGNOSIS — G8929 Other chronic pain: Secondary | ICD-10-CM | POA: Diagnosis not present

## 2020-02-13 DIAGNOSIS — I10 Essential (primary) hypertension: Secondary | ICD-10-CM | POA: Diagnosis not present

## 2020-02-13 DIAGNOSIS — F17210 Nicotine dependence, cigarettes, uncomplicated: Secondary | ICD-10-CM | POA: Diagnosis not present

## 2020-02-13 DIAGNOSIS — Z79899 Other long term (current) drug therapy: Secondary | ICD-10-CM | POA: Insufficient documentation

## 2020-02-13 DIAGNOSIS — R0981 Nasal congestion: Secondary | ICD-10-CM | POA: Diagnosis not present

## 2020-02-13 DIAGNOSIS — M25562 Pain in left knee: Secondary | ICD-10-CM | POA: Insufficient documentation

## 2020-02-13 DIAGNOSIS — Z7982 Long term (current) use of aspirin: Secondary | ICD-10-CM | POA: Diagnosis not present

## 2020-02-13 NOTE — ED Triage Notes (Signed)
Pt c/o knee pain no injury.

## 2020-02-13 NOTE — Discharge Instructions (Signed)
Thank you for allowing me to care for you today in the Emergency Department.   Continue to use your nose spray at home as directed.  Follow-up with primary care if symptoms persist.  Return to the emergency department for new or worsening symptoms.

## 2020-02-13 NOTE — ED Provider Notes (Signed)
[facility] EMERGENCY DEPARTMENT Provider Note   CSN: [id number] Arrival date & time: 02/12/20  2308     History Chief Complaint  Patient presents with  . Nasal Congestion    [name] is a 69 y.o. male with a history of BPH, GERD who presents to the ER almost daily with complaints of nasal congestion.  The patient has had 74 visits to the ER in the last 6 months.  He reports that while he has been waiting that his nasal congestion has resolved and he has no complaints at this time.  Reports he has been using a nasal spray at home with improvement.  He denies rhinorrhea, fever, chills, cough, shortness of breath, chest pain, headache, sinus pain or pressure.  States that he is feeling better after a nap and is ready for discharge.  The history is provided by the patient and medical records. No language interpreter was used.       Past Medical History:  Diagnosis Date  . BPH (benign prostatic hyperplasia)   . Colon polyp 2010  . Depression   . GERD (gastroesophageal reflux disease)   . Hepatitis C    "caught it when I had a blood transfusion"  . High cholesterol   . History of blood transfusion    "when I was young"  . Hypertension   . Paranoid schizophrenia ([place])   . Prostate atrophy   . Retinal vein occlusion     Patient Active Problem List   Diagnosis Date Noted  . GERD (gastroesophageal reflux disease) 05/03/2019  . Hypotension 08/02/2018  . Low back sprain, initial encounter 10/21/2017  . History of drug abuse ([place]) 10/18/2017  . Gingival erythema 09/23/2017  . Chronic pain of left knee 11/24/2016  . Tobacco abuse   . Diastolic heart failure ([place]) 02/05/2015  . Healthcare maintenance 05/28/2014  . Hyperlipidemia 03/10/2014  . Patellar tendonitis 11/16/2012  . [place] OCCLUSION 06/03/2010  . PARANOID SCHIZOPHRENIA, CHRONIC 01/17/2009  . HYPERTENSION, BENIGN ESSENTIAL 01/17/2009  . BPH (benign prostatic hyperplasia) 01/17/2009    . HEPATITIS C 12/14/2008  . DEPRESSION 12/14/2008    Past Surgical History:  Procedure Laterality Date  . CIRCUMCISION  1959  . TONSILLECTOMY         Family History  Problem Relation Age of Onset  . Hypertension Mother   . Diabetes Mother   . Stroke Mother     Social History   Tobacco Use  . Smoking status: Current Every Day Smoker    Packs/day: 0.50    Years: 48.00    Pack years: 24.00    Types: Cigarettes  . Smokeless tobacco: Never Used  Substance Use Topics  . Alcohol use: Yes  . Drug use: No    Home Medications Prior to Admission medications   Medication Sig Start Date End Date Taking? Authorizing Provider  acetaminophen (TYLENOL) 500 MG tablet Take 1 tablet (500 mg total) by mouth every 6 (six) hours as needed. 04/23/18   [name], [name], PA-C  amLODipine (NORVASC) 10 MG tablet Take 1 tablet (10 mg total) by mouth daily. 04/13/18   [name], DO  aspirin 81 MG tablet Take 1 tablet (81 mg total) by mouth daily. 09/24/14   [name], MD  atorvastatin (LIPITOR) 40 MG tablet Take 1 tablet (40 mg total) by mouth daily. 08/15/19   [name], DO  benztropine (COGENTIN) 1 MG tablet Take 1 tablet (1 mg total) by mouth 2 (two) times  daily. 11/05/16   [name], MD  cetirizine (ZYRTEC ALLERGY) 10 MG tablet Take 1 tablet (10 mg total) by mouth daily. 02/01/20   [name], [name], PA-C  famotidine (PEPCID) 20 MG tablet Take 1 tablet (20 mg total) by mouth 2 (two) times daily. 05/02/19   [name], DO  fluPHENAZine decanoate (PROLIXIN) 25 MG/ML injection Inject 25 mg into the muscle every 14 (fourteen) days. Last dose 09/07/12    [provider]  fluticasone (FLONASE) 50 MCG/ACT nasal spray Place 2 sprays into both nostrils daily. 02/07/20   Nuala Alpha A, PA-C  losartan (COZAAR) 100 MG tablet Take 1 tablet (100 mg total) by mouth daily. 11/28/19   Tammi Klippel, Sherin, DO  mometasone (NASONEX) 50 MCG/ACT nasal spray Place 2 sprays into the nose daily.  12/29/19   [name], PA-C  Multiple Vitamin (MULTIVITAMIN WITH MINERALS) TABS tablet Take 1 tablet by mouth daily. 06/28/14   [name], MD  sodium chloride (OCEAN) 0.65 % SOLN nasal spray Place 1 spray into both nostrils as needed for congestion. 11/19/19   [name], PA-C  tamsulosin (FLOMAX) 0.4 MG CAPS capsule Take 2 capsules (0.8 mg total) by mouth daily. 11/27/17   [name], MD  tamsulosin (FLOMAX) 0.4 MG CAPS capsule TAKE 2 CAPSULE BY MOUTH DAILY 01/09/20   [name], DO  traMADol (ULTRAM) 50 MG tablet Take 1 tablet (50 mg total) by mouth every 6 (six) hours as needed. 07/04/18   [name], MD  triamcinolone cream (KENALOG) 0.1 % Apply 1 application topically 2 (two) times daily. For 5 days, then twice daily as needed for rash and itching. 01/03/15   [name], MD  vitamin C (ASCORBIC ACID) 500 MG tablet Take 500 mg by mouth daily.    [provider]  loratadine (CLARITIN) 10 MG tablet Take 1 tablet (10 mg total) by mouth daily. 12/28/19 02/05/20  [name], DO    Allergies    Lisinopril, Penicillins, Amoxicillin, and Ibuprofen  Review of Systems   Review of Systems  Constitutional: Negative for activity change, chills and fever.  HENT: Positive for congestion. Negative for sinus pressure and sinus pain.   Respiratory: Negative for shortness of breath.   Cardiovascular: Negative for chest pain.  Gastrointestinal: Negative for abdominal pain.  Musculoskeletal: Negative for back pain.  Skin: Negative for rash.  Neurological: Negative for headaches.    Physical Exam Updated Vital Signs BP (!) 155/97   Pulse 62   Temp 98.5 F (36.9 C) (Oral)   Resp 18   SpO2 98%   Physical Exam Vitals and nursing note reviewed.  Constitutional:      Appearance: He is well-developed.     Comments: Sleeping on arrival to the room, but arouses easily to voice.  No acute distress.  HENT:     Head: Normocephalic.     Nose: Congestion  present. No mucosal edema or rhinorrhea.     Comments: Mild congestion noted.  No significant erythema or mucosal edema is noted.  Bilateral nares are patent. Eyes:     Conjunctiva/sclera: Conjunctivae normal.  Cardiovascular:     Rate and Rhythm: Normal rate and regular rhythm.     Heart sounds: No murmur heard.   Pulmonary:     Effort: Pulmonary effort is normal. No respiratory distress.     Breath sounds: No stridor. No wheezing, rhonchi or rales.  Chest:     Chest wall: No tenderness.  Abdominal:  General: There is no distension.     Palpations: Abdomen is soft.  Musculoskeletal:     Cervical back: Neck supple.  Skin:    General: Skin is warm and dry.  Neurological:     Mental Status: He is alert.  Psychiatric:        Behavior: Behavior normal.     ED Results / Procedures / Treatments   Labs (all labs ordered are listed, but only abnormal results are displayed) Labs Reviewed - No data to display  EKG None  Radiology No results found.  Procedures Procedures (including critical care time)  Medications Ordered in ED Medications - No data to display  ED Course  I have reviewed the triage vital signs and the nursing notes.  Pertinent labs & imaging results that were available during my care of the patient were reviewed by me and considered in my medical decision making (see chart for details).    MDM Rules/Calculators/A&P                          69 year old male who is well-known to this emergency department with 74 visits in the last 6 months.  He frequently presents with complaints of nasal congestion, which prompted his visit tonight.  He has been using Flonase at home with some improvement.  He reports that his nasal congestion has resolved since arriving in the ER.  He currently has no complaints.  He is sleeping with equal, even respirations in the room on my evaluation.  Vital signs are reassuring.  He has no other complaints.  ER return precautions  given.  He is hemodynamically stable in no acute distress.  Safe for discharge with outpatient follow-up as needed.  Final Clinical Impression(s) / ED Diagnoses Final diagnoses:  Chronic nasal congestion    Rx / DC Orders ED Discharge Orders    None       Jazmene Racz A, PA-C 02/13/20 0155    Orpah Greek, MD 02/14/20 [phone]

## 2020-02-13 NOTE — ED Notes (Signed)
Pt one touch pt, see providers assess.ent

## 2020-02-14 ENCOUNTER — Emergency Department (HOSPITAL_COMMUNITY)
Admission: EM | Admit: 2020-02-14 | Discharge: 2020-02-15 | Disposition: A | Discharge status: Home / Self Care | Payer: Medicare Other | Source: Home / Self Care | Attending: Emergency Medicine | Admitting: Emergency Medicine

## 2020-02-14 ENCOUNTER — Ambulatory Visit (HOSPITAL_COMMUNITY)
Admission: EM | Admit: 2020-02-14 | Discharge: 2020-02-14 | Disposition: A | Discharge status: Home / Self Care | Payer: Medicare Other

## 2020-02-14 DIAGNOSIS — M25562 Pain in left knee: Secondary | ICD-10-CM | POA: Diagnosis not present

## 2020-02-14 DIAGNOSIS — G8929 Other chronic pain: Secondary | ICD-10-CM | POA: Insufficient documentation

## 2020-02-14 NOTE — ED Provider Notes (Signed)
Behavioral Health Medical Screening Exam  [name] is a 69 y.o. male presents to urgent care voluntarily as a walk-in. Pt was unable to state why he was here. Pt reports he walked from [facility] to urgent care. Pt speech was garbled. He denies SI, HI, SH, AVH, and paranoia. Pt states he will like to get a ride to go home.   Total Time spent with patient: 15 minutes  Psychiatric Specialty Exam  Presentation  General Appearance:Disheveled  Eye Contact:Good  Speech:Garbled  Speech Volume:Normal  Handedness:Right   Mood and Affect  Mood:Euthymic  Affect:Congruent   Thought Process  Thought Processes:Goal Directed  Descriptions of Associations:Intact  Orientation:Partial  Thought Content:Logical  Hallucinations:None  Ideas of Reference:None  Suicidal Thoughts:No  Homicidal Thoughts:No   Sensorium  Memory:Immediate Fair  Judgment:Fair  Insight:Fair   Executive Functions  Concentration:Fair  Attention Span:Fair  [place]  Language:Good   Psychomotor Activity  Psychomotor Activity:Normal   Assets  Assets:Communication Skills   Sleep  Sleep:Fair  Number of hours: unable to asses Physical Exam: Physical Exam ROS Blood pressure (!) 154/96, pulse 71, temperature 98.1 F (36.7 C), temperature source Oral, resp. rate 18, SpO2 100 %. There is no height or weight on file to calculate BMI.  Musculoskeletal: Strength & Muscle Tone: within normal limits Gait & Station: normal Patient leans: N/A   Recommendations:  Based on my evaluation the patient does not appear to have an emergency medical condition.   Disposition: No evidence of imminent risk to self or others at present.   Patient does not meet criteria for psychiatric inpatient admission. Supportive therapy provided about ongoing stressors. Discussed crisis plan, support from social network, calling 911, coming to the Emergency Department, and calling  Suicide Hotline.  Mliss Fritz, NP 02/14/2020, 6:18 AM

## 2020-02-14 NOTE — ED Provider Notes (Signed)
[facility] EMERGENCY DEPARTMENT Provider Note   CSN: [id number] Arrival date & time: 02/13/20  2117     History Chief Complaint  Patient presents with  . Knee Pain    [name] is a 69 y.o. male with a history of frequent ER visits, schizophrenia, BPH, who presented to the emergency department with a chief complaint of left knee pain.  He then added that he has also been having nasal congestion.  He reports that his left knee has been hurting for some time.  No recent falls or injuries.  No fever or chills.  Has been evaluated many times in the ER for nasal congestion and takes Flonase at home.  He has brought his Flonase with him tonight and is administered his nighttime dose while I am in the room examining him.  He has no other complaints at this time.  Otherwise, he is talking excitedly about his 5 grandchildren.   The history is provided by the patient. No language interpreter was used.       Past Medical History:  Diagnosis Date  . BPH (benign prostatic hyperplasia)   . Colon polyp 2010  . Depression   . GERD (gastroesophageal reflux disease)   . Hepatitis C    "caught it when I had a blood transfusion"  . High cholesterol   . History of blood transfusion    "when I was young"  . Hypertension   . Paranoid schizophrenia ([place])   . Prostate atrophy   . Retinal vein occlusion     Patient Active Problem List   Diagnosis Date Noted  . GERD (gastroesophageal reflux disease) 05/03/2019  . Hypotension 08/02/2018  . Low back sprain, initial encounter 10/21/2017  . History of drug abuse ([place]) 10/18/2017  . Gingival erythema 09/23/2017  . Chronic pain of left knee 11/24/2016  . Tobacco abuse   . Diastolic heart failure ([place]) 02/05/2015  . Healthcare maintenance 05/28/2014  . Hyperlipidemia 03/10/2014  . Patellar tendonitis 11/16/2012  . [place] OCCLUSION 06/03/2010  . PARANOID SCHIZOPHRENIA, CHRONIC 01/17/2009  . HYPERTENSION, BENIGN  ESSENTIAL 01/17/2009  . BPH (benign prostatic hyperplasia) 01/17/2009  . HEPATITIS C 12/14/2008  . DEPRESSION 12/14/2008    Past Surgical History:  Procedure Laterality Date  . CIRCUMCISION  1959  . TONSILLECTOMY         Family History  Problem Relation Age of Onset  . Hypertension Mother   . Diabetes Mother   . Stroke Mother     Social History   Tobacco Use  . Smoking status: Current Every Day Smoker    Packs/day: 0.50    Years: 48.00    Pack years: 24.00    Types: Cigarettes  . Smokeless tobacco: Never Used  Substance Use Topics  . Alcohol use: Yes  . Drug use: No    Home Medications Prior to Admission medications   Medication Sig Start Date End Date Taking? Authorizing Provider  acetaminophen (TYLENOL) 500 MG tablet Take 1 tablet (500 mg total) by mouth every 6 (six) hours as needed. 04/23/18   [name], [name], PA-C  amLODipine (NORVASC) 10 MG tablet Take 1 tablet (10 mg total) by mouth daily. 04/13/18   [name], DO  aspirin 81 MG tablet Take 1 tablet (81 mg total) by mouth daily. 09/24/14   [name], MD  atorvastatin (LIPITOR) 40 MG tablet Take 1 tablet (40 mg total) by mouth daily. 08/15/19   [name], DO  benztropine (COGENTIN)  1 MG tablet Take 1 tablet (1 mg total) by mouth 2 (two) times daily. 11/05/16   [name], MD  cetirizine (ZYRTEC ALLERGY) 10 MG tablet Take 1 tablet (10 mg total) by mouth daily. 02/01/20   [name], [name], PA-C  famotidine (PEPCID) 20 MG tablet Take 1 tablet (20 mg total) by mouth 2 (two) times daily. 05/02/19   [name], DO  fluPHENAZine decanoate (PROLIXIN) 25 MG/ML injection Inject 25 mg into the muscle every 14 (fourteen) days. Last dose 09/07/12    [provider]  fluticasone (FLONASE) 50 MCG/ACT nasal spray Place 2 sprays into both nostrils daily. 02/07/20   Nuala Alpha A, PA-C  losartan (COZAAR) 100 MG tablet Take 1 tablet (100 mg total) by mouth daily. 11/28/19   Tammi Klippel, Sherin, DO    mometasone (NASONEX) 50 MCG/ACT nasal spray Place 2 sprays into the nose daily. 12/29/19   [name], PA-C  Multiple Vitamin (MULTIVITAMIN WITH MINERALS) TABS tablet Take 1 tablet by mouth daily. 06/28/14   [name], MD  sodium chloride (OCEAN) 0.65 % SOLN nasal spray Place 1 spray into both nostrils as needed for congestion. 11/19/19   [name], PA-C  tamsulosin (FLOMAX) 0.4 MG CAPS capsule Take 2 capsules (0.8 mg total) by mouth daily. 11/27/17   [name], MD  tamsulosin (FLOMAX) 0.4 MG CAPS capsule TAKE 2 CAPSULE BY MOUTH DAILY 01/09/20   [name], DO  traMADol (ULTRAM) 50 MG tablet Take 1 tablet (50 mg total) by mouth every 6 (six) hours as needed. 07/04/18   [name], MD  triamcinolone cream (KENALOG) 0.1 % Apply 1 application topically 2 (two) times daily. For 5 days, then twice daily as needed for rash and itching. 01/03/15   [name], MD  vitamin C (ASCORBIC ACID) 500 MG tablet Take 500 mg by mouth daily.    [provider]  loratadine (CLARITIN) 10 MG tablet Take 1 tablet (10 mg total) by mouth daily. 12/28/19 02/05/20  [name], DO    Allergies    Lisinopril, Penicillins, Amoxicillin, and Ibuprofen  Review of Systems   Review of Systems  Constitutional: Negative for activity change, chills and fever.  HENT: Positive for congestion. Negative for sinus pressure, sinus pain and sore throat.   Respiratory: Negative for shortness of breath.   Cardiovascular: Negative for chest pain.  Gastrointestinal: Negative for abdominal pain.  Musculoskeletal: Positive for arthralgias. Negative for back pain, gait problem, joint swelling and neck stiffness.  Skin: Negative for rash.  Neurological: Negative for weakness and numbness.    Physical Exam Updated Vital Signs BP (!) 178/110 (BP Location: Left Arm)   Pulse 68   Temp 98.7 F (37.1 C) (Oral)   Resp 16   SpO2 100%   Physical Exam Vitals and nursing note reviewed.   Constitutional:      Appearance: He is well-developed.     Comments: Patient is walking around the room without difficulty.  He is asking when he will be discharged.  He is in no acute distress.  HENT:     Head: Normocephalic.     Nose:     Right Turbinates: Not enlarged or swollen.     Left Turbinates: Not enlarged or swollen.     Comments: No significant nasal congestion noted.  Nares are patent. Eyes:     Conjunctiva/sclera: Conjunctivae normal.  Cardiovascular:     Rate and Rhythm: Normal rate and regular rhythm.     Heart sounds:  No murmur heard.   Pulmonary:     Effort: Pulmonary effort is normal.  Abdominal:     General: There is no distension.     Palpations: Abdomen is soft.  Musculoskeletal:        General: No swelling, tenderness, deformity or signs of injury.     Cervical back: Neck supple.     Right lower leg: No edema.     Left lower leg: No edema.     Comments: Normal exam of the bilateral lower extremities.  No focal tenderness to the bilateral knees.  No erythema, edema, or warmth.  Skin:    General: Skin is warm and dry.  Neurological:     Mental Status: He is alert.  Psychiatric:        Behavior: Behavior normal.     ED Results / Procedures / Treatments   Labs (all labs ordered are listed, but only abnormal results are displayed) Labs Reviewed - No data to display  EKG None  Radiology No results found.  Procedures Procedures (including critical care time)  Medications Ordered in ED Medications - No data to display  ED Course  I have reviewed the triage vital signs and the nursing notes.  Pertinent labs & imaging results that were available during my care of the patient were reviewed by me and considered in my medical decision making (see chart for details).    MDM Rules/Calculators/A&P                          69 year old male with a history of frequent ER visits, schizophrenia, BPH who is well-known to the emergency department with 76  visits in the last 6 months.  This is my second time evaluating the patient in the last 24 hours.  He typically presents with complaints of left knee pain and nasal congestion.  His physical exam is unremarkable today.  He has been ambulating around the room and following me in triage while I am working on his discharge paperwork.  He is in no acute distress.  There is no evidence of trauma to the knee.  Doubt gout or septic arthritis.  HEENT exam is unremarkable.  Patient has used his nighttime dose of his home Flonase in front of me and I have also encouraged him to take his home losartan as his blood pressure is elevated in the ER, which he has also taken while I been in the room.  At this time, no further urgent or emergent work-up is indicated.  He is hemodynamically stable and in no acute distress.  Safe for discharge with outpatient follow-up as needed.  Final Clinical Impression(s) / ED Diagnoses Final diagnoses:  Chronic pain of left knee  Nasal congestion    Rx / DC Orders ED Discharge Orders    None       Tysean Vandervliet A, PA-C 02/14/20 0930    Orpah Greek, MD 02/15/20 2316

## 2020-02-14 NOTE — ED Triage Notes (Signed)
Pt c/o left knee pain. Ambulatory without difficulty.

## 2020-02-14 NOTE — ED Notes (Signed)
[place] [address]

## 2020-02-14 NOTE — BH Assessment (Signed)
Comprehensive Clinical Assessment (CCA) Screening, Triage and Referral Note  02/14/2020 [name] [id number]  Patient presents as a walk-in to [facility]. Patient was seen earlier at [facility] with complaint of knee pain. Patient denied SI, HI and psychosis. When asked why are you here today, patient stated "I walked here from [facility]". Patient was pleasant, cooperative and talkative.   SEE [facility] EDP NOTE 02/13/20: [name] is a 69 y.o. male with a history of BPH, GERD who presents to the ER almost daily with complaints of nasal congestion.  The patient has had 74 visits to the ER in the last 6 months.  He reports that while he   has been waiting that his nasal congestion has resolved and he has no complaints at this time.  Reports he has been using a nasal spray at home with improvement.  He denies rhinorrhea, fever, chills, cough, shortness of breath, chest pain, headache, sinus pain or pressure.  States that he is feeling better after a nap and is ready for discharge.  Patient already had an appointment scheduled for 02/22/2020 to start OPT medication management.   Pt consented for clinician to call his wife [name], [phone]) to obtain collateral information, however after a few rings clinician received the following message: "We're sorry but the call can not be completed at this time please hang up and try your call again."     Disposition: Corey Grumbling, NP, patient does not meet admission criteria. Patient will follow up with [facility] appointment on 02/22/2020 for OPT medication management.   Visit Diagnosis: No diagnosis found.  Patient Reported Information How did you hear about [place]? Self   Referral name: Corey Arbour, RN.   Referral phone number: No data recorded Whom do you see for routine medical problems? I don't have a doctor   Practice/Facility Name: [facility]   Practice/Facility Phone Number: No data recorded  Name of Contact: [name]  [facility].   Contact Number: No data recorded  Contact Fax Number: No data recorded  Prescriber Name: No data recorded  Prescriber Address (if known): No data recorded What Is the Reason for Your Visit/Call Today? "I walked here from [facility]"  How Long Has This Been Causing You Problems? > than 6 months  Have You Recently Been in Any Inpatient Treatment (Hospital/Detox/Crisis Center/28-Day Program)? Yes   Name/Location of Program/Hospital:[facility]   How Long Were You There? overnight observation   When Were You Discharged? No data recorded Have You Ever Received Services From [facility] Before? Yes   Who Do You See at [facility]? [name]  Have You Recently Had Any Thoughts About Hurting Yourself? No   Are You Planning to Commit Suicide/Harm Yourself At This time?  No  Have you Recently Had Thoughts About [place]? No   Explanation: No data recorded Have You Used Any Alcohol or Drugs in the Past 24 Hours? No   How Long Ago Did You Use Drugs or Alcohol?  No data recorded  What Did You Use and How Much? No data recorded What Do You Feel Would Help You the Most Today? Other (Comment) ("I walked here from [employer]")  Do You Currently Have a Therapist/Psychiatrist? No   Name of Therapist/Psychiatrist: Dr. Josph Lowe at [facility] (psychiatrist)   Have You Been Recently Discharged From Any Office Practice or Programs? No data recorded  Explanation of Discharge From Practice/Program:  No data recorded    CCA Screening Triage  Referral Assessment Type of Contact: Face-to-Face   Is this Initial or Reassessment? No data recorded  Date Telepsych consult ordered in [facility]:  No data recorded  Time Telepsych consult ordered in [facility]:  No data recorded Patient Reported Information Reviewed? Yes   Patient Left Without Being Seen? No data recorded  Reason for Not Completing Assessment: No data recorded Collateral Involvement: [phone]  Does Patient Have a [place]? No data recorded  Name and Contact of Legal Guardian:  No data recorded If Minor and Not Living with Parent(s), Who has Custody? No data recorded Is CPS involved or ever been involved? Never  Is APS involved or ever been involved? Never  Patient Determined To Be At Risk for Harm To Self or Others Based on Review of Patient Reported Information or Presenting Complaint? No   Method: No data recorded  Availability of Means: No data recorded  Intent: No data recorded  Notification Required: No data recorded  Additional Information for Danger to Others Potential:  No data recorded  Additional Comments for Danger to Others Potential:  No data recorded  Are There Guns or Other Weapons in Your Home?  No data recorded   Types of Guns/Weapons: No data recorded   Are These Weapons Safely Secured?                              No data recorded   Who Could Verify You Are Able To Have These Secured:    No data recorded Do You Have any Outstanding Charges, Pending Court Dates, Parole/Probation? No data recorded Contacted To Inform of Risk of Harm To Self or Others: No data recorded Location of Assessment: [facility]  Does Patient Present under Involuntary Commitment? No   IVC Papers Initial File Date: No data recorded  [place] of Residence: [place]  Patient Currently Receiving the Following Services: Medication Management   Determination of Need: Routine (7 days)   Options For Referral: Medication Management   Venora Maples, [facility]

## 2020-02-14 NOTE — Discharge Instructions (Addendum)
Thank you for allowing me to care for you today in the Emergency Department.   Continue to use your home medications.  Follow up with Bevely Palmer Placey at the [facility].   Return to the ER for new or worsening symptoms.

## 2020-02-15 ENCOUNTER — Ambulatory Visit (HOSPITAL_COMMUNITY): Payer: Medicare Other | Admitting: Psychiatry

## 2020-02-15 MED ORDER — ACETAMINOPHEN 500 MG PO TABS
1000.0000 mg | ORAL_TABLET | Freq: Once | ORAL | Status: AC
  Administered 2020-02-15: 1000 mg via ORAL
  Filled 2020-02-15: qty 2

## 2020-02-15 NOTE — ED Provider Notes (Signed)
[facility] EMERGENCY DEPARTMENT Provider Note   CSN: [id number] Arrival date & time: 02/14/20  2016     History Chief Complaint  Patient presents with  . Knee Pain    [name] is a 69 y.o. male.  [name] is a 69 y.o. male with a history of frequent ER visits, schizophrenia, BPH, who presented to the emergency department with a chief complaint of left knee pain.  He is well known for coming to the ER for the same. Denies new injury or fall. He is looking for pain relief.         Past Medical History:  Diagnosis Date  . BPH (benign prostatic hyperplasia)   . Colon polyp 2010  . Depression   . GERD (gastroesophageal reflux disease)   . Hepatitis C    "caught it when I had a blood transfusion"  . High cholesterol   . History of blood transfusion    "when I was young"  . Hypertension   . Paranoid schizophrenia ([place])   . Prostate atrophy   . Retinal vein occlusion     Patient Active Problem List   Diagnosis Date Noted  . GERD (gastroesophageal reflux disease) 05/03/2019  . Hypotension 08/02/2018  . Low back sprain, initial encounter 10/21/2017  . History of drug abuse ([place]) 10/18/2017  . Gingival erythema 09/23/2017  . Chronic pain of left knee 11/24/2016  . Tobacco abuse   . Diastolic heart failure ([place]) 02/05/2015  . Healthcare maintenance 05/28/2014  . Hyperlipidemia 03/10/2014  . Patellar tendonitis 11/16/2012  . [place] OCCLUSION 06/03/2010  . PARANOID SCHIZOPHRENIA, CHRONIC 01/17/2009  . HYPERTENSION, BENIGN ESSENTIAL 01/17/2009  . BPH (benign prostatic hyperplasia) 01/17/2009  . HEPATITIS C 12/14/2008  . DEPRESSION 12/14/2008    Past Surgical History:  Procedure Laterality Date  . CIRCUMCISION  1959  . TONSILLECTOMY         Family History  Problem Relation Age of Onset  . Hypertension Mother   . Diabetes Mother   . Stroke Mother     Social History   Tobacco Use  . Smoking status: Current Every  Day Smoker    Packs/day: 0.50    Years: 48.00    Pack years: 24.00    Types: Cigarettes  . Smokeless tobacco: Never Used  Substance Use Topics  . Alcohol use: Yes  . Drug use: No    Home Medications Prior to Admission medications   Medication Sig Start Date End Date Taking? Authorizing Provider  acetaminophen (TYLENOL) 500 MG tablet Take 1 tablet (500 mg total) by mouth every 6 (six) hours as needed. 04/23/18   [name], [name], PA-C  amLODipine (NORVASC) 10 MG tablet Take 1 tablet (10 mg total) by mouth daily. 04/13/18   [name], DO  aspirin 81 MG tablet Take 1 tablet (81 mg total) by mouth daily. 09/24/14   [name], MD  atorvastatin (LIPITOR) 40 MG tablet Take 1 tablet (40 mg total) by mouth daily. 08/15/19   [name], DO  benztropine (COGENTIN) 1 MG tablet Take 1 tablet (1 mg total) by mouth 2 (two) times daily. 11/05/16   [name], MD  cetirizine (ZYRTEC ALLERGY) 10 MG tablet Take 1 tablet (10 mg total) by mouth daily. 02/01/20   [name], [name], PA-C  famotidine (PEPCID) 20 MG tablet Take 1 tablet (20 mg total) by mouth 2 (two) times daily. 05/02/19   [name], DO  fluPHENAZine decanoate (PROLIXIN) 25 MG/ML  injection Inject 25 mg into the muscle every 14 (fourteen) days. Last dose 09/07/12    [provider]  fluticasone (FLONASE) 50 MCG/ACT nasal spray Place 2 sprays into both nostrils daily. 02/07/20   Nuala Alpha A, PA-C  losartan (COZAAR) 100 MG tablet Take 1 tablet (100 mg total) by mouth daily. 11/28/19   Tammi Klippel, Sherin, DO  mometasone (NASONEX) 50 MCG/ACT nasal spray Place 2 sprays into the nose daily. 12/29/19   [name], PA-C  Multiple Vitamin (MULTIVITAMIN WITH MINERALS) TABS tablet Take 1 tablet by mouth daily. 06/28/14   [name], MD  sodium chloride (OCEAN) 0.65 % SOLN nasal spray Place 1 spray into both nostrils as needed for congestion. 11/19/19   [name], PA-C  tamsulosin (FLOMAX) 0.4 MG CAPS capsule Take  2 capsules (0.8 mg total) by mouth daily. 11/27/17   [name], MD  tamsulosin (FLOMAX) 0.4 MG CAPS capsule TAKE 2 CAPSULE BY MOUTH DAILY 01/09/20   [name], DO  traMADol (ULTRAM) 50 MG tablet Take 1 tablet (50 mg total) by mouth every 6 (six) hours as needed. 07/04/18   [name], MD  triamcinolone cream (KENALOG) 0.1 % Apply 1 application topically 2 (two) times daily. For 5 days, then twice daily as needed for rash and itching. 01/03/15   [name], MD  vitamin C (ASCORBIC ACID) 500 MG tablet Take 500 mg by mouth daily.    [provider]  loratadine (CLARITIN) 10 MG tablet Take 1 tablet (10 mg total) by mouth daily. 12/28/19 02/05/20  [name], DO    Allergies    Lisinopril, Penicillins, Amoxicillin, and Ibuprofen  Review of Systems   Review of Systems  Constitutional: Negative for chills and fever.  Musculoskeletal: Positive for arthralgias. Negative for back pain and gait problem.  Neurological: Negative for dizziness, light-headedness and numbness.  All other systems reviewed and are negative.   Physical Exam Updated Vital Signs BP (!) 152/93 (BP Location: Left Arm)   Pulse 71   Temp 98.9 F (37.2 C) (Oral)   Resp 18   SpO2 99%   Physical Exam Vitals and nursing note reviewed.  Constitutional:      General: He is not in acute distress.    Appearance: Normal appearance. He is not ill-appearing, toxic-appearing or diaphoretic.     Comments: unkempt  HENT:     Head: Normocephalic.     Mouth/Throat:     Mouth: Mucous membranes are moist.  Eyes:     Conjunctiva/sclera: Conjunctivae normal.  Pulmonary:     Effort: Pulmonary effort is normal.  Abdominal:     General: Abdomen is flat.  Musculoskeletal:     Comments: Antalgic gait favoring the left knee. Normal ROM without swelling. Mild diffuse TTP  Skin:    General: Skin is warm and dry.     Findings: No bruising, erythema, lesion or rash.  Neurological:     General: No focal  deficit present.     Mental Status: He is alert.     Sensory: No sensory deficit.  Psychiatric:        Mood and Affect: Mood normal.     ED Results / Procedures / Treatments   Labs (all labs ordered are listed, but only abnormal results are displayed) Labs Reviewed - No data to display  EKG None  Radiology No results found.  Procedures Procedures (including critical care time)  Medications Ordered in ED Medications  acetaminophen (TYLENOL) tablet 1,000 mg (has  no administration in time range)    ED Course  I have reviewed the triage vital signs and the nursing notes.  Pertinent labs & imaging results that were available during my care of the patient were reviewed by me and considered in my medical decision making (see chart for details).    MDM Rules/Calculators/A&P                          Based on review of vitals, medical screening exam, lab work and/or imaging, there does not appear to be an acute, emergent etiology for the patient's symptoms. Counseled pt on good return precautions and encouraged both PCP and ED follow-up as needed.  Prior to discharge, I also discussed incidental imaging findings with patient in detail and advised appropriate, recommended follow-up in detail.  Clinical Impression: 1. Chronic pain of left knee     Disposition: Discharge  Prior to providing a prescription for a controlled substance, I independently reviewed the patient's recent prescription history on the [place]. The patient had no recent or regular prescriptions and was deemed appropriate for a brief, less than 3 day prescription of narcotic for acute analgesia.  This note was prepared with assistance of Systems analyst. Occasional wrong-word or sound-a-like substitutions may have occurred due to the inherent limitations of voice recognition software.  Final Clinical Impression(s) / ED Diagnoses Final diagnoses:    Chronic pain of left knee    Rx / DC Orders ED Discharge Orders    None       Kristine Royal 02/15/20 5830    Ezequiel Essex, MD 02/15/20 [phone]

## 2020-02-16 ENCOUNTER — Ambulatory Visit (HOSPITAL_COMMUNITY)
Admission: EM | Admit: 2020-02-16 | Discharge: 2020-02-16 | Disposition: A | Discharge status: Home / Self Care | Payer: Medicare Other

## 2020-02-16 ENCOUNTER — Encounter (HOSPITAL_COMMUNITY): Payer: Self-pay | Admitting: Emergency Medicine

## 2020-02-16 ENCOUNTER — Other Ambulatory Visit: Payer: Self-pay

## 2020-02-16 ENCOUNTER — Emergency Department (HOSPITAL_COMMUNITY)
Admission: EM | Admit: 2020-02-16 | Discharge: 2020-02-16 | Disposition: A | Discharge status: Home / Self Care | Payer: Medicare Other | Attending: Emergency Medicine | Admitting: Emergency Medicine

## 2020-02-16 DIAGNOSIS — M25562 Pain in left knee: Secondary | ICD-10-CM | POA: Diagnosis present

## 2020-02-16 DIAGNOSIS — F200 Paranoid schizophrenia: Secondary | ICD-10-CM | POA: Diagnosis not present

## 2020-02-16 DIAGNOSIS — F17210 Nicotine dependence, cigarettes, uncomplicated: Secondary | ICD-10-CM | POA: Insufficient documentation

## 2020-02-16 DIAGNOSIS — Z79899 Other long term (current) drug therapy: Secondary | ICD-10-CM | POA: Diagnosis not present

## 2020-02-16 DIAGNOSIS — G8929 Other chronic pain: Secondary | ICD-10-CM | POA: Diagnosis not present

## 2020-02-16 NOTE — BH Assessment (Signed)
Pt presented to [facility] as a walk-in however pt expressed he did not want to be assessed. Pt reported, he just left [facility] due to knee pain and was given Tylenol. Pt reported, he wanted his knee wrapped, clinician asked the pt if he needed to be transferred to [facility] to address his continuing knee pain. Pt declined. Pt denies, SI, HI, AVH, self-injurious behaviors and access to weapons. Clinician explained to the pt the function of the TTS assessment and the Outpatient Clinic. Clinician encouraged the pt to come to the Outpatient Clinic during office hours to better address his needs due to not being in crisis. Clinician reminded the pt of his medication management appointment on 02/22/2020 at [facility]. Clinician observed the pt sleeping in the lobby.   Clinician discussed the information above with [name], NP who recommends the pt does not meet inpatient treatment criteria.   Per chart, pt was assessed at [facility] on 02/10/2020 and 02/12/2020 with the disposition of: "pt does not meet inpatient treatment criteria, pt follow up with [facility] for medication management appointment on 02/22/2020.  Per EDP note at [facility] on 02/16/2020 at 0415: "Patient presents to the ED with CC of left knee pain.  He states his knee is giving him "a fit."  He has hx of left knee pain.  Seen frequently for the same.  No successful treatments PTA.  Has worn a knee sleeve.  Denies new injuries."   Pt presents with slurred speech.    *Pt did not want clinician to contact his wife.*   Vertell Novak, MS, [facility], [facility]. Triage Specialist. [phone].

## 2020-02-16 NOTE — ED Triage Notes (Signed)
Pt c/o L knee pain, ambulatory

## 2020-02-16 NOTE — ED Provider Notes (Signed)
[place] EMERGENCY DEPARTMENT Provider Note   CSN: [id number] Arrival date & time: 02/16/20  0021     History Chief Complaint  Patient presents with  . Knee Pain    [name] is a 69 y.o. male.  Patient presents to the ED with CC of left knee pain.  He states his knee is giving him "a fit."  He has hx of left knee pain.  Seen frequently for the same.  No successful treatments PTA.  Has worn a knee sleeve.  Denies new injuries.  The history is provided by the patient. No language interpreter was used.       Past Medical History:  Diagnosis Date  . BPH (benign prostatic hyperplasia)   . Colon polyp 2010  . Depression   . GERD (gastroesophageal reflux disease)   . Hepatitis C    "caught it when I had a blood transfusion"  . High cholesterol   . History of blood transfusion    "when I was young"  . Hypertension   . Paranoid schizophrenia ([place])   . Prostate atrophy   . Retinal vein occlusion     Patient Active Problem List   Diagnosis Date Noted  . GERD (gastroesophageal reflux disease) 05/03/2019  . Hypotension 08/02/2018  . Low back sprain, initial encounter 10/21/2017  . History of drug abuse ([place]) 10/18/2017  . Gingival erythema 09/23/2017  . Chronic pain of left knee 11/24/2016  . Tobacco abuse   . Diastolic heart failure ([place]) 02/05/2015  . Healthcare maintenance 05/28/2014  . Hyperlipidemia 03/10/2014  . Patellar tendonitis 11/16/2012  . [place] OCCLUSION 06/03/2010  . PARANOID SCHIZOPHRENIA, CHRONIC 01/17/2009  . HYPERTENSION, BENIGN ESSENTIAL 01/17/2009  . BPH (benign prostatic hyperplasia) 01/17/2009  . HEPATITIS C 12/14/2008  . DEPRESSION 12/14/2008    Past Surgical History:  Procedure Laterality Date  . CIRCUMCISION  1959  . TONSILLECTOMY         Family History  Problem Relation Age of Onset  . Hypertension Mother   . Diabetes Mother   . Stroke Mother     Social History   Tobacco Use  . Smoking  status: Current Every Day Smoker    Packs/day: 0.50    Years: 48.00    Pack years: 24.00    Types: Cigarettes  . Smokeless tobacco: Never Used  Substance Use Topics  . Alcohol use: Yes  . Drug use: No    Home Medications Prior to Admission medications   Medication Sig Start Date End Date Taking? Authorizing Provider  acetaminophen (TYLENOL) 500 MG tablet Take 1 tablet (500 mg total) by mouth every 6 (six) hours as needed. 04/23/18   [name], [name], PA-C  amLODipine (NORVASC) 10 MG tablet Take 1 tablet (10 mg total) by mouth daily. 04/13/18   [name], DO  aspirin 81 MG tablet Take 1 tablet (81 mg total) by mouth daily. 09/24/14   [name], MD  atorvastatin (LIPITOR) 40 MG tablet Take 1 tablet (40 mg total) by mouth daily. 08/15/19   [name], DO  benztropine (COGENTIN) 1 MG tablet Take 1 tablet (1 mg total) by mouth 2 (two) times daily. 11/05/16   [name], MD  cetirizine (ZYRTEC ALLERGY) 10 MG tablet Take 1 tablet (10 mg total) by mouth daily. 02/01/20   [name], [name], PA-C  famotidine (PEPCID) 20 MG tablet Take 1 tablet (20 mg total) by mouth 2 (two) times daily. 05/02/19   [name], DO  fluPHENAZine decanoate (PROLIXIN) 25 MG/ML injection Inject 25 mg into the muscle every 14 (fourteen) days. Last dose 09/07/12    [provider]  fluticasone (FLONASE) 50 MCG/ACT nasal spray Place 2 sprays into both nostrils daily. 02/07/20   Nuala Alpha A, PA-C  losartan (COZAAR) 100 MG tablet Take 1 tablet (100 mg total) by mouth daily. 11/28/19   Tammi Klippel, Sherin, DO  mometasone (NASONEX) 50 MCG/ACT nasal spray Place 2 sprays into the nose daily. 12/29/19   [name], PA-C  Multiple Vitamin (MULTIVITAMIN WITH MINERALS) TABS tablet Take 1 tablet by mouth daily. 06/28/14   [name], MD  sodium chloride (OCEAN) 0.65 % SOLN nasal spray Place 1 spray into both nostrils as needed for congestion. 11/19/19   [name], PA-C  tamsulosin (FLOMAX) 0.4  MG CAPS capsule Take 2 capsules (0.8 mg total) by mouth daily. 11/27/17   [name], MD  tamsulosin (FLOMAX) 0.4 MG CAPS capsule TAKE 2 CAPSULE BY MOUTH DAILY 01/09/20   [name], DO  traMADol (ULTRAM) 50 MG tablet Take 1 tablet (50 mg total) by mouth every 6 (six) hours as needed. 07/04/18   [name], MD  triamcinolone cream (KENALOG) 0.1 % Apply 1 application topically 2 (two) times daily. For 5 days, then twice daily as needed for rash and itching. 01/03/15   [name], MD  vitamin C (ASCORBIC ACID) 500 MG tablet Take 500 mg by mouth daily.    [provider]  loratadine (CLARITIN) 10 MG tablet Take 1 tablet (10 mg total) by mouth daily. 12/28/19 02/05/20  [name], DO    Allergies    Lisinopril, Penicillins, Amoxicillin, and Ibuprofen  Review of Systems   Review of Systems  Musculoskeletal: Positive for arthralgias. Negative for gait problem and joint swelling.  Skin: Negative for rash and wound.    Physical Exam Updated Vital Signs BP (!) 156/99 (BP Location: Left Arm)   Pulse 86   Temp 97.7 F (36.5 C) (Oral)   Resp 16   Ht 5\' 9"  (1.753 m)   Wt 74.8 kg   SpO2 100%   BMI 24.37 kg/m   Physical Exam Vitals and nursing note reviewed.  Constitutional:      General: He is not in acute distress.    Appearance: He is well-developed. He is not ill-appearing.  HENT:     Head: Normocephalic and atraumatic.  Eyes:     Conjunctiva/sclera: Conjunctivae normal.  Cardiovascular:     Rate and Rhythm: Normal rate.  Pulmonary:     Effort: Pulmonary effort is normal. No respiratory distress.  Abdominal:     General: There is no distension.  Musculoskeletal:     Cervical back: Neck supple.     Comments: Moves all extremities Ambulatory No bony abnormality or deformity about the left knee  Skin:    General: Skin is warm and dry.  Neurological:     Mental Status: He is alert and oriented to person, place, and time.  Psychiatric:         Mood and Affect: Mood normal.        Behavior: Behavior normal.     ED Results / Procedures / Treatments   Labs (all labs ordered are listed, but only abnormal results are displayed) Labs Reviewed - No data to display  EKG None  Radiology No results found.  Procedures Procedures (including critical care time)  Medications Ordered in ED Medications - No data to display  ED Course  I have reviewed the triage vital signs and the nursing notes.  Pertinent labs & imaging results that were available during my care of the patient were reviewed by me and considered in my medical decision making (see chart for details).    MDM Rules/Calculators/A&P                          Patient referred to ortho for chronic knee pain.  No new changes requiring any emergent workup or consultation.   Final Clinical Impression(s) / ED Diagnoses Final diagnoses:  Chronic pain of left knee    Rx / DC Orders ED Discharge Orders    None       [name], PA-C 02/16/20 [place], Donald, MD 02/17/20 [phone]

## 2020-02-17 ENCOUNTER — Other Ambulatory Visit: Payer: Self-pay

## 2020-02-17 ENCOUNTER — Emergency Department (HOSPITAL_COMMUNITY)
Admission: EM | Admit: 2020-02-17 | Discharge: 2020-02-17 | Disposition: A | Discharge status: Home / Self Care | Payer: Medicare Other | Attending: Emergency Medicine | Admitting: Emergency Medicine

## 2020-02-17 ENCOUNTER — Encounter (HOSPITAL_COMMUNITY): Payer: Self-pay | Admitting: Emergency Medicine

## 2020-02-17 DIAGNOSIS — F17210 Nicotine dependence, cigarettes, uncomplicated: Secondary | ICD-10-CM | POA: Diagnosis not present

## 2020-02-17 DIAGNOSIS — G8929 Other chronic pain: Secondary | ICD-10-CM | POA: Diagnosis not present

## 2020-02-17 DIAGNOSIS — I10 Essential (primary) hypertension: Secondary | ICD-10-CM | POA: Diagnosis not present

## 2020-02-17 DIAGNOSIS — M25562 Pain in left knee: Secondary | ICD-10-CM | POA: Diagnosis not present

## 2020-02-17 DIAGNOSIS — Z7982 Long term (current) use of aspirin: Secondary | ICD-10-CM | POA: Diagnosis not present

## 2020-02-17 DIAGNOSIS — Z79899 Other long term (current) drug therapy: Secondary | ICD-10-CM | POA: Insufficient documentation

## 2020-02-17 NOTE — ED Notes (Signed)
Discharge instructions discussed with pt. Pt verbalized understanding with no questions at this time.   When asked if RN could talk VS, Pt states "no ma'am." Pt escorted by [employer]

## 2020-02-17 NOTE — ED Provider Notes (Signed)
TIME SEEN: 6:39 AM  CHIEF COMPLAINT: Chronic left knee pain  HPI: Patient is a 69 year old male with history of chronic knee pain who presents to the emergency department for complaints of knee pain.  No new injury.  No new complaints.  Here frequently for the same.  ROS: See HPI Constitutional: no fever  Eyes: no drainage  ENT: no runny nose   Cardiovascular:  no chest pain  Resp: no SOB  GI: no vomiting GU: no dysuria Integumentary: no rash  Allergy: no hives  Musculoskeletal: no leg swelling  Neurological: no slurred speech ROS otherwise negative  PAST MEDICAL HISTORY/PAST SURGICAL HISTORY:  Past Medical History:  Diagnosis Date  . BPH (benign prostatic hyperplasia)   . Colon polyp 2010  . Depression   . GERD (gastroesophageal reflux disease)   . Hepatitis C    "caught it when I had a blood transfusion"  . High cholesterol   . History of blood transfusion    "when I was young"  . Hypertension   . Paranoid schizophrenia ([place])   . Prostate atrophy   . Retinal vein occlusion     MEDICATIONS:  Prior to Admission medications   Medication Sig Start Date End Date Taking? Authorizing Provider  acetaminophen (TYLENOL) 500 MG tablet Take 1 tablet (500 mg total) by mouth every 6 (six) hours as needed. 04/23/18   [name], [name], PA-C  amLODipine (NORVASC) 10 MG tablet Take 1 tablet (10 mg total) by mouth daily. 04/13/18   [name], DO  aspirin 81 MG tablet Take 1 tablet (81 mg total) by mouth daily. 09/24/14   [name], MD  atorvastatin (LIPITOR) 40 MG tablet Take 1 tablet (40 mg total) by mouth daily. 08/15/19   [name], DO  benztropine (COGENTIN) 1 MG tablet Take 1 tablet (1 mg total) by mouth 2 (two) times daily. 11/05/16   [name], MD  cetirizine (ZYRTEC ALLERGY) 10 MG tablet Take 1 tablet (10 mg total) by mouth daily. 02/01/20   [name], [name], PA-C  famotidine (PEPCID) 20 MG tablet Take 1 tablet (20 mg total) by mouth 2 (two) times daily. 05/02/19    [name], DO  fluPHENAZine decanoate (PROLIXIN) 25 MG/ML injection Inject 25 mg into the muscle every 14 (fourteen) days. Last dose 09/07/12    [provider]  fluticasone (FLONASE) 50 MCG/ACT nasal spray Place 2 sprays into both nostrils daily. 02/07/20   Nuala Alpha A, PA-C  losartan (COZAAR) 100 MG tablet Take 1 tablet (100 mg total) by mouth daily. 11/28/19   Tammi Klippel, Sherin, DO  mometasone (NASONEX) 50 MCG/ACT nasal spray Place 2 sprays into the nose daily. 12/29/19   [name], PA-C  Multiple Vitamin (MULTIVITAMIN WITH MINERALS) TABS tablet Take 1 tablet by mouth daily. 06/28/14   [name], MD  sodium chloride (OCEAN) 0.65 % SOLN nasal spray Place 1 spray into both nostrils as needed for congestion. 11/19/19   [name], PA-C  tamsulosin (FLOMAX) 0.4 MG CAPS capsule Take 2 capsules (0.8 mg total) by mouth daily. 11/27/17   [name], MD  tamsulosin (FLOMAX) 0.4 MG CAPS capsule TAKE 2 CAPSULE BY MOUTH DAILY 01/09/20   [name], DO  traMADol (ULTRAM) 50 MG tablet Take 1 tablet (50 mg total) by mouth every 6 (six) hours as needed. 07/04/18   [name], MD  triamcinolone cream (KENALOG) 0.1 % Apply 1 application topically 2 (two) times daily. For 5 days, then twice daily as needed  for rash and itching. 01/03/15   [name], MD  vitamin C (ASCORBIC ACID) 500 MG tablet Take 500 mg by mouth daily.    [provider]  loratadine (CLARITIN) 10 MG tablet Take 1 tablet (10 mg total) by mouth daily. 12/28/19 02/05/20  [name], DO    ALLERGIES:  Allergies  Allergen Reactions  . Lisinopril Other (See Comments)    Cough  . Penicillins Hives  . Amoxicillin Rash  . Ibuprofen Rash    SOCIAL HISTORY:  Social History   Tobacco Use  . Smoking status: Current Every Day Smoker    Packs/day: 0.50    Years: 48.00    Pack years: 24.00    Types: Cigarettes  . Smokeless tobacco: Never Used  Substance Use Topics  . Alcohol  use: Yes    FAMILY HISTORY: Family History  Problem Relation Age of Onset  . Hypertension Mother   . Diabetes Mother   . Stroke Mother     EXAM: BP (!) 169/92 (BP Location: Left Arm)   Pulse 84   Temp 98.1 F (36.7 C) (Oral)   Resp 18   SpO2 98%  CONSTITUTIONAL: Alert and responds appropriately to questions. Well-appearing; well-nourished HEAD: Normocephalic, atraumatic EYES: Conjunctivae clear, pupils appear equal ENT: normal nose; moist mucous membranes NECK: Normal range of motion CARD: Regular rate and rhythm RESP: Normal chest excursion without splinting or tachypnea; no hypoxia or respiratory distress, speaking full sentences ABD/GI: non-distended EXT: Normal ROM in all joints, no major deformities noted, left knee is nontender to palpation, no ligamentous laxity, no redness or warmth, no soft tissue swelling, no joint effusion, compartments soft in the left lower extremity, extremities warm and well perfused, no calf tenderness SKIN: Normal color for age and race, no rashes on exposed skin NEURO: Moves all extremities equally, normal speech, no facial asymmetry noted PSYCH: The patient's mood and manner are appropriate. Grooming and personal hygiene are appropriate.  MEDICAL DECISION MAKING: Patient here with complaints of chronic left knee pain.  No acute abnormality seen on exam.  Suspect patient is malingering.  He does have a psychiatric history which is likely contributing to his frequent ED visits.  I feel patient is safe to be discharged.  He has been able to ambulate here in the ED.  At this time, I do not feel there is any life-threatening condition present. I have reviewed, interpreted and discussed all results (EKG, imaging, lab, urine as appropriate) and exam findings with patient/family. I have reviewed nursing notes and appropriate previous records.  I feel the patient is safe to be discharged home without further emergent workup and can continue workup as an  outpatient as needed. Discussed usual and customary return precautions. Patient/family verbalize understanding and are comfortable with this plan.  Outpatient follow-up has been provided as needed. All questions have been answered.  [name] was evaluated in Emergency Department on 02/17/2020 for the symptoms described in the history of present illness. He was evaluated in the context of the global COVID-19 pandemic, which necessitated consideration that the patient might be at risk for infection with the SARS-CoV-2 virus that causes COVID-19. Institutional protocols and algorithms that pertain to the evaluation of patients at risk for COVID-19 are in a state of rapid change based on information released by regulatory bodies including the CDC and federal and state organizations. These policies and algorithms were followed during the patient's care in the ED.      Felicia Both, Delice Bison, DO  02/17/20 0640  

## 2020-02-17 NOTE — ED Triage Notes (Signed)
Patient with left knee pain, no injury.

## 2020-02-18 ENCOUNTER — Emergency Department (HOSPITAL_COMMUNITY)
Admission: EM | Admit: 2020-02-18 | Discharge: 2020-02-18 | Disposition: A | Discharge status: Home / Self Care | Payer: Medicare Other | Source: Home / Self Care | Attending: Emergency Medicine | Admitting: Emergency Medicine

## 2020-02-18 ENCOUNTER — Encounter (HOSPITAL_COMMUNITY): Payer: Self-pay

## 2020-02-18 ENCOUNTER — Other Ambulatory Visit: Payer: Self-pay

## 2020-02-18 ENCOUNTER — Emergency Department (HOSPITAL_COMMUNITY)
Admission: EM | Admit: 2020-02-18 | Discharge: 2020-02-19 | Disposition: A | Discharge status: Home / Self Care | Payer: Medicare Other | Attending: Emergency Medicine | Admitting: Emergency Medicine

## 2020-02-18 DIAGNOSIS — G8929 Other chronic pain: Secondary | ICD-10-CM

## 2020-02-18 DIAGNOSIS — Z79899 Other long term (current) drug therapy: Secondary | ICD-10-CM | POA: Insufficient documentation

## 2020-02-18 DIAGNOSIS — M25562 Pain in left knee: Secondary | ICD-10-CM | POA: Insufficient documentation

## 2020-02-18 DIAGNOSIS — R0981 Nasal congestion: Secondary | ICD-10-CM

## 2020-02-18 DIAGNOSIS — I10 Essential (primary) hypertension: Secondary | ICD-10-CM | POA: Diagnosis not present

## 2020-02-18 DIAGNOSIS — F17210 Nicotine dependence, cigarettes, uncomplicated: Secondary | ICD-10-CM | POA: Diagnosis not present

## 2020-02-18 DIAGNOSIS — R519 Headache, unspecified: Secondary | ICD-10-CM | POA: Diagnosis present

## 2020-02-18 DIAGNOSIS — M25569 Pain in unspecified knee: Secondary | ICD-10-CM

## 2020-02-18 NOTE — ED Triage Notes (Signed)
Pt c/o L knee pain and sinus congestion.

## 2020-02-18 NOTE — ED Provider Notes (Signed)
TIME SEEN: 5:10 AM  CHIEF COMPLAINT: Left knee pain  HPI: Patient is a 69 year old male well-known to our emergency department with chronic left knee pain who presents to the ED today for the same.  No new injury.  No redness, warmth, fever.  Has been able to ambulate.  ROS: See HPI Constitutional: no fever  Eyes: no drainage  ENT: no runny nose   Cardiovascular:  no chest pain  Resp: no SOB  GI: no vomiting GU: no dysuria Integumentary: no rash  Allergy: no hives  Musculoskeletal: no leg swelling  Neurological: no slurred speech ROS otherwise negative  PAST MEDICAL HISTORY/PAST SURGICAL HISTORY:  History reviewed. No pertinent past medical history.  MEDICATIONS:  Prior to Admission medications   Not on File    ALLERGIES:  No Known Allergies  SOCIAL HISTORY:  Social History   Tobacco Use  . Smoking status: Never Smoker  . Smokeless tobacco: Never Used  Substance Use Topics  . Alcohol use: Never    FAMILY HISTORY: No family history on file.  EXAM: BP (!) 180/103 (BP Location: Left Arm)   Pulse (!) 101   Temp 98.1 F (36.7 C) (Oral)   Resp 17   SpO2 99%  CONSTITUTIONAL: Alert and responds appropriately to questions. Well-appearing; well-nourished HEAD: Normocephalic, atraumatic EYES: Conjunctivae clear, pupils appear equal ENT: normal nose; moist mucous membranes NECK: Normal range of motion CARD: Regular rate and rhythm RESP: Normal chest excursion without splinting or tachypnea; no hypoxia or respiratory distress, speaking full sentences ABD/GI: non-distended EXT: Normal ROM in all joints, no major deformities noted, no tenderness to palpation over either knee, no joint effusion, no redness or warmth, no calf tenderness or calf swelling, extremities warm and well-perfused SKIN: Normal color for age and race, no rashes on exposed skin NEURO: Moves all extremities equally, normal speech, no facial asymmetry noted PSYCH: The patient's mood and manner are  appropriate. Grooming and personal hygiene are appropriate.  MEDICAL DECISION MAKING: Patient here for chronic knee pain.  History of psychiatric illness, homelessness, substance abuse.  Suspect malingering.  No sign of acute emergent illness.  Doubt fracture, DVT, arterial obstruction, septic arthritis, compartment syndrome, gout.  Will discharge.  Patient has been provided with outpatient resources numerous times.  At this time, I do not feel there is any life-threatening condition present. I have reviewed, interpreted and discussed all results (EKG, imaging, lab, urine as appropriate) and exam findings with patient/family. I have reviewed nursing notes and appropriate previous records.  I feel the patient is safe to be discharged home without further emergent workup and can continue workup as an outpatient as needed. Discussed usual and customary return precautions. Patient/family verbalize understanding and are comfortable with this plan.  Outpatient follow-up has been provided as needed. All questions have been answered.   [name] was evaluated in Emergency Department on 02/18/2020 for the symptoms described in the history of present illness. He was evaluated in the context of the global COVID-19 pandemic, which necessitated consideration that the patient might be at risk for infection with the SARS-CoV-2 virus that causes COVID-19. Institutional protocols and algorithms that pertain to the evaluation of patients at risk for COVID-19 are in a state of rapid change based on information released by regulatory bodies including the CDC and federal and state organizations. These policies and algorithms were followed during the patient's care in the ED.      Djuan Talton, Delice Bison, DO 02/18/20 [phone]

## 2020-02-18 NOTE — ED Triage Notes (Signed)
Pt coming every day complaining of chronic knee pain, pt able to walk in and out of triage with steady gait.

## 2020-02-19 NOTE — ED Provider Notes (Signed)
[facility] EMERGENCY DEPARTMENT Provider Note   CSN: [id number] Arrival date & time: 02/18/20  2140     History Chief Complaint  Patient presents with  . Facial Pain    [name] is a 69 y.o. male.  Patient presents to the ED with sinus congestion and chronic knee pain.  Has hx of the same.  He is seen frequently in the ED.  No new complaints.  No successful treatments PTA.  The history is provided by the patient. No language interpreter was used.       Past Medical History:  Diagnosis Date  . BPH (benign prostatic hyperplasia)   . Colon polyp 2010  . Depression   . GERD (gastroesophageal reflux disease)   . Hepatitis C    "caught it when I had a blood transfusion"  . High cholesterol   . History of blood transfusion    "when I was young"  . Hypertension   . Paranoid schizophrenia ([place])   . Prostate atrophy   . Retinal vein occlusion     Patient Active Problem List   Diagnosis Date Noted  . GERD (gastroesophageal reflux disease) 05/03/2019  . Hypotension 08/02/2018  . Low back sprain, initial encounter 10/21/2017  . History of drug abuse ([place]) 10/18/2017  . Gingival erythema 09/23/2017  . Chronic pain of left knee 11/24/2016  . Tobacco abuse   . Diastolic heart failure ([place]) 02/05/2015  . Healthcare maintenance 05/28/2014  . Hyperlipidemia 03/10/2014  . Patellar tendonitis 11/16/2012  . [place] OCCLUSION 06/03/2010  . PARANOID SCHIZOPHRENIA, CHRONIC 01/17/2009  . HYPERTENSION, BENIGN ESSENTIAL 01/17/2009  . BPH (benign prostatic hyperplasia) 01/17/2009  . HEPATITIS C 12/14/2008  . DEPRESSION 12/14/2008    Past Surgical History:  Procedure Laterality Date  . CIRCUMCISION  1959  . TONSILLECTOMY         Family History  Problem Relation Age of Onset  . Hypertension Mother   . Diabetes Mother   . Stroke Mother     Social History   Tobacco Use  . Smoking status: Current Every Day Smoker    Packs/day: 0.50     Years: 48.00    Pack years: 24.00    Types: Cigarettes  . Smokeless tobacco: Never Used  Substance Use Topics  . Alcohol use: Yes  . Drug use: No    Home Medications Prior to Admission medications   Medication Sig Start Date End Date Taking? Authorizing Provider  acetaminophen (TYLENOL) 500 MG tablet Take 1 tablet (500 mg total) by mouth every 6 (six) hours as needed. 04/23/18   [name], [name], PA-C  amLODipine (NORVASC) 10 MG tablet Take 1 tablet (10 mg total) by mouth daily. 04/13/18   [name], DO  aspirin 81 MG tablet Take 1 tablet (81 mg total) by mouth daily. 09/24/14   [name], MD  atorvastatin (LIPITOR) 40 MG tablet Take 1 tablet (40 mg total) by mouth daily. 08/15/19   [name], DO  benztropine (COGENTIN) 1 MG tablet Take 1 tablet (1 mg total) by mouth 2 (two) times daily. 11/05/16   [name], MD  cetirizine (ZYRTEC ALLERGY) 10 MG tablet Take 1 tablet (10 mg total) by mouth daily. 02/01/20   [name], [name], PA-C  famotidine (PEPCID) 20 MG tablet Take 1 tablet (20 mg total) by mouth 2 (two) times daily. 05/02/19   [name], DO  fluPHENAZine decanoate (PROLIXIN) 25 MG/ML injection Inject 25 mg into the muscle every 14 (  fourteen) days. Last dose 09/07/12    [provider]  fluticasone (FLONASE) 50 MCG/ACT nasal spray Place 2 sprays into both nostrils daily. 02/07/20   Nuala Alpha A, PA-C  losartan (COZAAR) 100 MG tablet Take 1 tablet (100 mg total) by mouth daily. 11/28/19   Tammi Klippel, Sherin, DO  mometasone (NASONEX) 50 MCG/ACT nasal spray Place 2 sprays into the nose daily. 12/29/19   [name], PA-C  Multiple Vitamin (MULTIVITAMIN WITH MINERALS) TABS tablet Take 1 tablet by mouth daily. 06/28/14   [name], MD  sodium chloride (OCEAN) 0.65 % SOLN nasal spray Place 1 spray into both nostrils as needed for congestion. 11/19/19   [name], PA-C  tamsulosin (FLOMAX) 0.4 MG CAPS capsule Take 2 capsules (0.8 mg total) by mouth  daily. 11/27/17   [name], MD  tamsulosin (FLOMAX) 0.4 MG CAPS capsule TAKE 2 CAPSULE BY MOUTH DAILY 01/09/20   [name], DO  traMADol (ULTRAM) 50 MG tablet Take 1 tablet (50 mg total) by mouth every 6 (six) hours as needed. 07/04/18   [name], MD  triamcinolone cream (KENALOG) 0.1 % Apply 1 application topically 2 (two) times daily. For 5 days, then twice daily as needed for rash and itching. 01/03/15   [name], MD  vitamin C (ASCORBIC ACID) 500 MG tablet Take 500 mg by mouth daily.    [provider]  loratadine (CLARITIN) 10 MG tablet Take 1 tablet (10 mg total) by mouth daily. 12/28/19 02/05/20  [name], DO    Allergies    Lisinopril, Penicillins, Amoxicillin, and Ibuprofen  Review of Systems   Review of Systems  Constitutional: Negative for chills and fever.  HENT: Positive for sinus pressure.   Musculoskeletal: Positive for arthralgias. Negative for joint swelling.    Physical Exam Updated Vital Signs BP (!) 166/80 (BP Location: Left Arm)   Pulse 70   Temp 98.1 F (36.7 C) (Oral)   Resp 16   SpO2 100%   Physical Exam Vitals and nursing note reviewed.  Constitutional:      General: He is not in acute distress.    Appearance: He is well-developed. He is not ill-appearing.  HENT:     Head: Normocephalic and atraumatic.  Eyes:     Conjunctiva/sclera: Conjunctivae normal.  Cardiovascular:     Rate and Rhythm: Normal rate.  Pulmonary:     Effort: Pulmonary effort is normal. No respiratory distress.  Abdominal:     General: There is no distension.  Musculoskeletal:     Cervical back: Neck supple.     Comments: Moves all extremities ambulatory  Skin:    General: Skin is warm and dry.  Neurological:     Mental Status: He is alert and oriented to person, place, and time.  Psychiatric:        Mood and Affect: Mood normal.        Behavior: Behavior normal.     ED Results / Procedures / Treatments   Labs (all labs  ordered are listed, but only abnormal results are displayed) Labs Reviewed - No data to display  EKG None  Radiology No results found.  Procedures Procedures (including critical care time)  Medications Ordered in ED Medications - No data to display  ED Course  I have reviewed the triage vital signs and the nursing notes.  Pertinent labs & imaging results that were available during my care of the patient were reviewed by me and considered in my medical  decision making (see chart for details).    MDM Rules/Calculators/A&P                          MSE complete. No indication for further emergent workup, consultation, admission, or evaluation for patient's unchanged, ongoing, chronic complaints. Final Clinical Impression(s) / ED Diagnoses Final diagnoses:  Sinus congestion  Chronic knee pain, unspecified laterality    Rx / DC Orders ED Discharge Orders    None       [name], PA-C 02/19/20 0448    Palumbo, April, MD 02/19/20 [phone]

## 2020-02-19 NOTE — ED Notes (Signed)
Discharge instructions discussed with pt. Pt verbalized understanding. Pt stable and ambulatory. No signature pad available. 

## 2020-02-21 ENCOUNTER — Encounter (HOSPITAL_COMMUNITY): Payer: Self-pay | Admitting: Emergency Medicine

## 2020-02-21 ENCOUNTER — Other Ambulatory Visit: Payer: Self-pay

## 2020-02-21 ENCOUNTER — Emergency Department (HOSPITAL_COMMUNITY)
Admission: EM | Admit: 2020-02-21 | Discharge: 2020-02-22 | Disposition: A | Discharge status: Home / Self Care | Payer: Medicare Other | Source: Home / Self Care | Attending: Emergency Medicine | Admitting: Emergency Medicine

## 2020-02-21 ENCOUNTER — Emergency Department (HOSPITAL_COMMUNITY)
Admission: EM | Admit: 2020-02-21 | Discharge: 2020-02-21 | Disposition: A | Discharge status: Home / Self Care | Payer: Medicare Other | Attending: Emergency Medicine | Admitting: Emergency Medicine

## 2020-02-21 DIAGNOSIS — G8929 Other chronic pain: Secondary | ICD-10-CM

## 2020-02-21 DIAGNOSIS — M25562 Pain in left knee: Secondary | ICD-10-CM | POA: Insufficient documentation

## 2020-02-21 DIAGNOSIS — F17210 Nicotine dependence, cigarettes, uncomplicated: Secondary | ICD-10-CM | POA: Insufficient documentation

## 2020-02-21 DIAGNOSIS — Z7982 Long term (current) use of aspirin: Secondary | ICD-10-CM | POA: Insufficient documentation

## 2020-02-21 DIAGNOSIS — I110 Hypertensive heart disease with heart failure: Secondary | ICD-10-CM | POA: Insufficient documentation

## 2020-02-21 DIAGNOSIS — Z79899 Other long term (current) drug therapy: Secondary | ICD-10-CM | POA: Insufficient documentation

## 2020-02-21 DIAGNOSIS — I10 Essential (primary) hypertension: Secondary | ICD-10-CM | POA: Insufficient documentation

## 2020-02-21 DIAGNOSIS — I5032 Chronic diastolic (congestive) heart failure: Secondary | ICD-10-CM | POA: Insufficient documentation

## 2020-02-21 NOTE — ED Triage Notes (Signed)
Pt c/o left knee pain.

## 2020-02-21 NOTE — Discharge Instructions (Signed)
Thank you for allowing me to care for you today in the Emergency Department.   Take 650 mg of Tylenol every 6 hours for pain.  Follow up with primary care.   Return to the ER if you become unable to walk, have a new fall or injury, or develop other, new concerning symptoms.

## 2020-02-21 NOTE — ED Triage Notes (Signed)
Pt says he has congestion and his knee hurts

## 2020-02-21 NOTE — ED Notes (Signed)
Discharge instructions discussed with pt. Pt verbalized understanding. Pt stable and ambulatory. No signature pad available. 

## 2020-02-21 NOTE — ED Provider Notes (Signed)
[facility] EMERGENCY DEPARTMENT Provider Note   CSN: [id number] Arrival date & time: 02/21/20  0015     History Chief Complaint  Patient presents with  . Knee Pain    [name] is a 69 y.o. male with a history of BPH, chronic left knee pain, and HTN who is well-known to the emergency department with 82 visits in the last 6 months who presents with complaints of left knee pain.  He states that he hit his left knee on an unknown object earlier today.  He did not fall.  He did not hit his head.  No nausea, vomiting, or diarrhea.  He has been able to walk since the injury earlier today and was able to walk to [employer] in the hospital prior to my evaluation.  He denies numbness, weakness, fever, chills, redness, swelling, or warmth.  No treatment prior to arrival.  He reports compliance with his home blood pressure medication.  The history is provided by the patient. No language interpreter was used.       Past Medical History:  Diagnosis Date  . BPH (benign prostatic hyperplasia)   . Colon polyp 2010  . Depression   . GERD (gastroesophageal reflux disease)   . Hepatitis C    "caught it when I had a blood transfusion"  . High cholesterol   . History of blood transfusion    "when I was young"  . Hypertension   . Paranoid schizophrenia ([place])   . Prostate atrophy   . Retinal vein occlusion     Patient Active Problem List   Diagnosis Date Noted  . GERD (gastroesophageal reflux disease) 05/03/2019  . Hypotension 08/02/2018  . Low back sprain, initial encounter 10/21/2017  . History of drug abuse ([place]) 10/18/2017  . Gingival erythema 09/23/2017  . Chronic pain of left knee 11/24/2016  . Tobacco abuse   . Diastolic heart failure ([place]) 02/05/2015  . Healthcare maintenance 05/28/2014  . Hyperlipidemia 03/10/2014  . Patellar tendonitis 11/16/2012  . [place] OCCLUSION 06/03/2010  . PARANOID SCHIZOPHRENIA, CHRONIC 01/17/2009  . HYPERTENSION,  BENIGN ESSENTIAL 01/17/2009  . BPH (benign prostatic hyperplasia) 01/17/2009  . HEPATITIS C 12/14/2008  . DEPRESSION 12/14/2008    Past Surgical History:  Procedure Laterality Date  . CIRCUMCISION  1959  . TONSILLECTOMY         Family History  Problem Relation Age of Onset  . Hypertension Mother   . Diabetes Mother   . Stroke Mother     Social History   Tobacco Use  . Smoking status: Current Every Day Smoker    Packs/day: 0.50    Years: 48.00    Pack years: 24.00    Types: Cigarettes  . Smokeless tobacco: Never Used  Substance Use Topics  . Alcohol use: Yes  . Drug use: No    Home Medications Prior to Admission medications   Medication Sig Start Date End Date Taking? Authorizing Provider  acetaminophen (TYLENOL) 500 MG tablet Take 1 tablet (500 mg total) by mouth every 6 (six) hours as needed. 04/23/18   [name], [name], PA-C  amLODipine (NORVASC) 10 MG tablet Take 1 tablet (10 mg total) by mouth daily. 04/13/18   [name], DO  aspirin 81 MG tablet Take 1 tablet (81 mg total) by mouth daily. 09/24/14   [name], MD  atorvastatin (LIPITOR) 40 MG tablet Take 1 tablet (40 mg total) by mouth daily. 08/15/19   [name], DO  benztropine (COGENTIN) 1 MG tablet Take 1 tablet (1 mg total) by mouth 2 (two) times daily. 11/05/16   [name], MD  cetirizine (ZYRTEC ALLERGY) 10 MG tablet Take 1 tablet (10 mg total) by mouth daily. 02/01/20   [name], [name], PA-C  famotidine (PEPCID) 20 MG tablet Take 1 tablet (20 mg total) by mouth 2 (two) times daily. 05/02/19   [name], DO  fluPHENAZine decanoate (PROLIXIN) 25 MG/ML injection Inject 25 mg into the muscle every 14 (fourteen) days. Last dose 09/07/12    [provider]  fluticasone (FLONASE) 50 MCG/ACT nasal spray Place 2 sprays into both nostrils daily. 02/07/20   Nuala Alpha A, PA-C  losartan (COZAAR) 100 MG tablet Take 1 tablet (100 mg total) by mouth daily. 11/28/19   Tammi Klippel, Sherin, DO    mometasone (NASONEX) 50 MCG/ACT nasal spray Place 2 sprays into the nose daily. 12/29/19   [name], PA-C  Multiple Vitamin (MULTIVITAMIN WITH MINERALS) TABS tablet Take 1 tablet by mouth daily. 06/28/14   [name], MD  sodium chloride (OCEAN) 0.65 % SOLN nasal spray Place 1 spray into both nostrils as needed for congestion. 11/19/19   [name], PA-C  tamsulosin (FLOMAX) 0.4 MG CAPS capsule Take 2 capsules (0.8 mg total) by mouth daily. 11/27/17   [name], MD  tamsulosin (FLOMAX) 0.4 MG CAPS capsule TAKE 2 CAPSULE BY MOUTH DAILY 01/09/20   [name], DO  traMADol (ULTRAM) 50 MG tablet Take 1 tablet (50 mg total) by mouth every 6 (six) hours as needed. 07/04/18   [name], MD  triamcinolone cream (KENALOG) 0.1 % Apply 1 application topically 2 (two) times daily. For 5 days, then twice daily as needed for rash and itching. 01/03/15   [name], MD  vitamin C (ASCORBIC ACID) 500 MG tablet Take 500 mg by mouth daily.    [provider]  loratadine (CLARITIN) 10 MG tablet Take 1 tablet (10 mg total) by mouth daily. 12/28/19 02/05/20  [name], DO    Allergies    Lisinopril, Penicillins, Amoxicillin, and Ibuprofen  Review of Systems   Review of Systems  Constitutional: Negative for appetite change and fever.  HENT: Negative for congestion and sore throat.   Respiratory: Negative for shortness of breath.   Cardiovascular: Negative for chest pain.  Gastrointestinal: Negative for abdominal pain, diarrhea, nausea and vomiting.  Genitourinary: Negative for dysuria.  Musculoskeletal: Positive for arthralgias and myalgias. Negative for back pain, gait problem, joint swelling, neck pain and neck stiffness.  Skin: Negative for rash.  Allergic/Immunologic: Negative for immunocompromised state.  Neurological: Negative for dizziness, seizures, syncope, weakness and headaches.  Psychiatric/Behavioral: Negative for confusion.    Physical  Exam Updated Vital Signs BP (!) 150/84 (BP Location: Left Arm)   Pulse (!) 50   Temp 98.1 F (36.7 C) (Oral)   Resp 16   SpO2 100%   Physical Exam Vitals and nursing note reviewed.  Constitutional:      Appearance: He is well-developed.  HENT:     Head: Normocephalic.  Eyes:     Conjunctiva/sclera: Conjunctivae normal.  Cardiovascular:     Rate and Rhythm: Normal rate and regular rhythm.     Heart sounds: No murmur heard.   Pulmonary:     Effort: Pulmonary effort is normal.  Abdominal:     General: There is no distension.     Palpations: Abdomen is soft.  Musculoskeletal:        General:  No swelling or tenderness.     Cervical back: Neck supple.     Right lower leg: No edema.     Left lower leg: No edema.     Comments: Normal exam of the left knee, hip, and ankle.  Neurovascularly intact.  Gait is not antalgic.  No ataxia.  Skin:    General: Skin is warm and dry.  Neurological:     Mental Status: He is alert.  Psychiatric:        Behavior: Behavior normal.     ED Results / Procedures / Treatments   Labs (all labs ordered are listed, but only abnormal results are displayed) Labs Reviewed - No data to display  EKG None  Radiology No results found.  Procedures Procedures (including critical care time)  Medications Ordered in ED Medications - No data to display  ED Course  I have reviewed the triage vital signs and the nursing notes.  Pertinent labs & imaging results that were available during my care of the patient were reviewed by me and considered in my medical decision making (see chart for details).    MDM Rules/Calculators/A&P                          69 year old male with a history of BPH, chronic left knee pain, and HTN who is well-known to the emergency department with 82 visits in the last 6 months.  He frequently presents with complaints of nasal congestion or left knee pain.  Today, his chief complaint is left knee pain.  His physical exam is  unremarkable.  His vital signs are reassuring.  Since checking into the ER, patient has been able to ambulate across the hospital to [employer].  He is neurovascularly intact on my exam.  Doubt fracture, gout, or septic joint.  He is hemodynamically stable and in no acute distress.  Safe for discharge home with primary care follow-up.  Final Clinical Impression(s) / ED Diagnoses Final diagnoses:  Chronic pain of left knee    Rx / DC Orders ED Discharge Orders    None       Joanne Gavel, PA-C 02/21/20 0436    [name], MD 02/21/20 [phone]

## 2020-02-22 ENCOUNTER — Ambulatory Visit (HOSPITAL_COMMUNITY): Payer: Self-pay

## 2020-02-22 ENCOUNTER — Encounter (HOSPITAL_COMMUNITY): Payer: Self-pay | Admitting: Emergency Medicine

## 2020-02-22 ENCOUNTER — Ambulatory Visit (HOSPITAL_COMMUNITY): Payer: Medicare Other | Admitting: Psychiatry

## 2020-02-22 ENCOUNTER — Emergency Department (HOSPITAL_COMMUNITY)
Admission: EM | Admit: 2020-02-22 | Discharge: 2020-02-23 | Disposition: A | Discharge status: Home / Self Care | Payer: Medicare Other | Attending: Emergency Medicine | Admitting: Emergency Medicine

## 2020-02-22 ENCOUNTER — Other Ambulatory Visit: Payer: Self-pay

## 2020-02-22 ENCOUNTER — Encounter (HOSPITAL_COMMUNITY): Payer: Self-pay | Admitting: Psychiatry

## 2020-02-22 ENCOUNTER — Other Ambulatory Visit: Payer: Self-pay | Admitting: Family Medicine

## 2020-02-22 DIAGNOSIS — F17210 Nicotine dependence, cigarettes, uncomplicated: Secondary | ICD-10-CM | POA: Insufficient documentation

## 2020-02-22 DIAGNOSIS — N400 Enlarged prostate without lower urinary tract symptoms: Secondary | ICD-10-CM

## 2020-02-22 DIAGNOSIS — Z7982 Long term (current) use of aspirin: Secondary | ICD-10-CM | POA: Insufficient documentation

## 2020-02-22 DIAGNOSIS — Z79899 Other long term (current) drug therapy: Secondary | ICD-10-CM | POA: Insufficient documentation

## 2020-02-22 DIAGNOSIS — M25562 Pain in left knee: Secondary | ICD-10-CM | POA: Insufficient documentation

## 2020-02-22 DIAGNOSIS — I10 Essential (primary) hypertension: Secondary | ICD-10-CM | POA: Insufficient documentation

## 2020-02-22 DIAGNOSIS — G8929 Other chronic pain: Secondary | ICD-10-CM | POA: Insufficient documentation

## 2020-02-22 NOTE — ED Provider Notes (Signed)
[facility] EMERGENCY DEPARTMENT Provider Note   CSN: [id number] Arrival date & time: 02/21/20  2319     History Chief Complaint  Patient presents with  . Nasal Congestion    [name] is a 69 y.o. male past medical history of BPH, GERD, hepatitis C who presents for evaluation of left knee pain.  Patient is well-known to the emergency department.  He has had 83 visits in the last 6 months.  He has chronic left knee pain and states that that is what brought him in today.  He states that is been hurting "for a while."  He will occasionally take Tylenol but then states that it continues to hurt.  He denies any recent trauma, injury, fall.  He does state that he hit it on something about a day or so ago.  Has been ambulatory since this happened.  He denies any numbness/weakness, fevers, chills, redness or swelling.  The history is provided by the patient.       Past Medical History:  Diagnosis Date  . BPH (benign prostatic hyperplasia)   . Colon polyp 2010  . Depression   . GERD (gastroesophageal reflux disease)   . Hepatitis C    "caught it when I had a blood transfusion"  . High cholesterol   . History of blood transfusion    "when I was young"  . Hypertension   . Paranoid schizophrenia ([place])   . Prostate atrophy   . Retinal vein occlusion     Patient Active Problem List   Diagnosis Date Noted  . GERD (gastroesophageal reflux disease) 05/03/2019  . Hypotension 08/02/2018  . Low back sprain, initial encounter 10/21/2017  . History of drug abuse ([place]) 10/18/2017  . Gingival erythema 09/23/2017  . Chronic pain of left knee 11/24/2016  . Tobacco abuse   . Diastolic heart failure ([place]) 02/05/2015  . Healthcare maintenance 05/28/2014  . Hyperlipidemia 03/10/2014  . Patellar tendonitis 11/16/2012  . [place] OCCLUSION 06/03/2010  . PARANOID SCHIZOPHRENIA, CHRONIC 01/17/2009  . HYPERTENSION, BENIGN ESSENTIAL 01/17/2009  . BPH (benign  prostatic hyperplasia) 01/17/2009  . HEPATITIS C 12/14/2008  . DEPRESSION 12/14/2008    Past Surgical History:  Procedure Laterality Date  . CIRCUMCISION  1959  . TONSILLECTOMY         Family History  Problem Relation Age of Onset  . Hypertension Mother   . Diabetes Mother   . Stroke Mother     Social History   Tobacco Use  . Smoking status: Current Every Day Smoker    Packs/day: 0.50    Years: 48.00    Pack years: 24.00    Types: Cigarettes  . Smokeless tobacco: Never Used  Substance Use Topics  . Alcohol use: Yes  . Drug use: No    Home Medications Prior to Admission medications   Medication Sig Start Date End Date Taking? Authorizing Provider  acetaminophen (TYLENOL) 500 MG tablet Take 1 tablet (500 mg total) by mouth every 6 (six) hours as needed. 04/23/18   [name], [name], PA-C  amLODipine (NORVASC) 10 MG tablet Take 1 tablet (10 mg total) by mouth daily. 04/13/18   [name], DO  aspirin 81 MG tablet Take 1 tablet (81 mg total) by mouth daily. 09/24/14   [name], MD  atorvastatin (LIPITOR) 40 MG tablet Take 1 tablet (40 mg total) by mouth daily. 08/15/19   [name], DO  benztropine (COGENTIN) 1 MG tablet Take 1 tablet (1  mg total) by mouth 2 (two) times daily. 11/05/16   [name], MD  cetirizine (ZYRTEC ALLERGY) 10 MG tablet Take 1 tablet (10 mg total) by mouth daily. 02/01/20   [name], [name], PA-C  famotidine (PEPCID) 20 MG tablet Take 1 tablet (20 mg total) by mouth 2 (two) times daily. 05/02/19   [name], DO  fluPHENAZine decanoate (PROLIXIN) 25 MG/ML injection Inject 25 mg into the muscle every 14 (fourteen) days. Last dose 09/07/12    [provider]  fluticasone (FLONASE) 50 MCG/ACT nasal spray Place 2 sprays into both nostrils daily. 02/07/20   Nuala Alpha A, PA-C  losartan (COZAAR) 100 MG tablet Take 1 tablet (100 mg total) by mouth daily. 11/28/19   Tammi Klippel, Sherin, DO  mometasone (NASONEX) 50 MCG/ACT nasal spray  Place 2 sprays into the nose daily. 12/29/19   [name], PA-C  Multiple Vitamin (MULTIVITAMIN WITH MINERALS) TABS tablet Take 1 tablet by mouth daily. 06/28/14   [name], MD  sodium chloride (OCEAN) 0.65 % SOLN nasal spray Place 1 spray into both nostrils as needed for congestion. 11/19/19   [name], PA-C  tamsulosin (FLOMAX) 0.4 MG CAPS capsule Take 2 capsules (0.8 mg total) by mouth daily. 11/27/17   [name], MD  tamsulosin (FLOMAX) 0.4 MG CAPS capsule TAKE 2 CAPSULE BY MOUTH DAILY 01/09/20   [name], DO  traMADol (ULTRAM) 50 MG tablet Take 1 tablet (50 mg total) by mouth every 6 (six) hours as needed. 07/04/18   [name], MD  triamcinolone cream (KENALOG) 0.1 % Apply 1 application topically 2 (two) times daily. For 5 days, then twice daily as needed for rash and itching. 01/03/15   [name], MD  vitamin C (ASCORBIC ACID) 500 MG tablet Take 500 mg by mouth daily.    [provider]  loratadine (CLARITIN) 10 MG tablet Take 1 tablet (10 mg total) by mouth daily. 12/28/19 02/05/20  [name], DO    Allergies    Lisinopril, Penicillins, Amoxicillin, and Ibuprofen  Review of Systems   Review of Systems  Constitutional: Negative for chills and fever.  Musculoskeletal:       Left knee pain  Skin: Negative for color change.  Neurological: Negative for weakness and numbness.  All other systems reviewed and are negative.   Physical Exam Updated Vital Signs BP (!) 150/100 (BP Location: Right Arm)   Pulse 72   Temp 98.3 F (36.8 C) (Oral)   Resp 18   SpO2 98%   Physical Exam Vitals and nursing note reviewed.  Constitutional:      Appearance: He is well-developed.  HENT:     Head: Normocephalic and atraumatic.  Eyes:     General: No scleral icterus.       Right eye: No discharge.        Left eye: No discharge.     Conjunctiva/sclera: Conjunctivae normal.  Cardiovascular:     Pulses:          Dorsalis pedis pulses  are 2+ on the right side and 2+ on the left side.  Pulmonary:     Effort: Pulmonary effort is normal.  Musculoskeletal:     Comments: Left knee is without any bony tenderness, warmth, erythema, edema.  No deformity or crepitus noted.  Flexion/extension intact without any difficulty.  No bony tenderness noted to tib-fib, ankle.  No tenderness noted to the right lower extremity.  Skin:    General: Skin is warm and  dry.  Neurological:     Mental Status: He is alert.     Comments: Sensation intact along major nerve distributions of BLE.  Dorsiflexion and plantar flexion intact.   Psychiatric:        Speech: Speech normal.        Behavior: Behavior normal.     ED Results / Procedures / Treatments   Labs (all labs ordered are listed, but only abnormal results are displayed) Labs Reviewed - No data to display  EKG None  Radiology No results found.  Procedures Procedures (including critical care time)  Medications Ordered in ED Medications - No data to display  ED Course  I have reviewed the triage vital signs and the nursing notes.  Pertinent labs & imaging results that were available during my care of the patient were reviewed by me and considered in my medical decision making (see chart for details).    MDM Rules/Calculators/A&P                          69 year old male who presents for evaluation of left knee pain.  Patient is well-known to this facility and has had 83 visits in the last 6 months.  He frequently complains of chronic left knee pain.  He denies any fall, trauma.  He did hit it on something yesterday and was seen and evaluated in the emergency department afterwards.  No fever, chills, redness, warmth.  On initially arrival, he is afebrile, nontoxic-appearing.  Vital signs are stable.  He is neurovascularly intact.  Exam not concerning for septic arthritis, DVT, ischemic limb.  Doubt acute traumatic fracture/dislocation given lack of fall, trauma, injury.  Patient  has been ambulatory.  No indication for x-rays at this time. At this time, patient exhibits no emergent life-threatening condition that require further evaluation in ED. Encouraged at home supportive care measures. At this time, patient exhibits no emergent life-threatening condition that require further evaluation in ED or admission. Patient had ample opportunity for questions and discussion. All patient's questions were answered with full understanding. Strict return precautions discussed. Patient expresses understanding and agreement to plan.   Portions of this note were generated with [employer]. Dictation errors may occur despite best attempts at proofreading.  Final Clinical Impression(s) / ED Diagnoses Final diagnoses:  Chronic pain of left knee    Rx / DC Orders ED Discharge Orders    None       Desma Mcgregor 02/22/20 1829    Virgel Manifold, MD 02/22/20 1021

## 2020-02-22 NOTE — ED Triage Notes (Signed)
Patient reports left knee pain today , denies injury or fall , ambulatory . Patient added left eye redness.

## 2020-02-22 NOTE — Discharge Instructions (Signed)
You can take Tylenol or Ibuprofen as directed for pain. You can alternate Tylenol and Ibuprofen every 4 hours. If you take Tylenol at 1pm, then you can take Ibuprofen at 5pm. Then you can take Tylenol again at 9pm.   Follow the RICE (Rest, Ice, Compression, Elevation) protocol as directed.   Follow-up with [facility] to establish a primary care doctor if you do not have one.   Return the emergency department for any worsening knee pain, redness or swelling of the knee, fevers or any other worsening concerning symptoms.

## 2020-02-23 ENCOUNTER — Encounter (HOSPITAL_COMMUNITY): Payer: Self-pay | Admitting: Student

## 2020-02-23 ENCOUNTER — Emergency Department (HOSPITAL_COMMUNITY)
Admission: EM | Admit: 2020-02-23 | Discharge: 2020-02-24 | Disposition: A | Discharge status: Home / Self Care | Payer: Medicare Other | Source: Home / Self Care | Attending: Emergency Medicine | Admitting: Emergency Medicine

## 2020-02-23 DIAGNOSIS — I5030 Unspecified diastolic (congestive) heart failure: Secondary | ICD-10-CM | POA: Insufficient documentation

## 2020-02-23 DIAGNOSIS — Z7982 Long term (current) use of aspirin: Secondary | ICD-10-CM | POA: Insufficient documentation

## 2020-02-23 DIAGNOSIS — M25562 Pain in left knee: Secondary | ICD-10-CM

## 2020-02-23 DIAGNOSIS — G8929 Other chronic pain: Secondary | ICD-10-CM | POA: Insufficient documentation

## 2020-02-23 DIAGNOSIS — F17210 Nicotine dependence, cigarettes, uncomplicated: Secondary | ICD-10-CM | POA: Insufficient documentation

## 2020-02-23 DIAGNOSIS — Z79899 Other long term (current) drug therapy: Secondary | ICD-10-CM | POA: Insufficient documentation

## 2020-02-23 DIAGNOSIS — I110 Hypertensive heart disease with heart failure: Secondary | ICD-10-CM | POA: Insufficient documentation

## 2020-02-23 NOTE — ED Notes (Signed)
Pt ambulatory.

## 2020-02-23 NOTE — ED Provider Notes (Signed)
[facility] EMERGENCY DEPARTMENT Provider Note   CSN: [id number] Arrival date & time: 02/22/20  2254     History Chief Complaint  Patient presents with  . Knee Pain    [name] is a 69 y.o. male with a history of paranoid schizophrenia, hypertension, hypercholesterolemia,& frequent ED visits who presents to the ED with complaints of L knee pain for awhile now. No alleviating/aggravating factors. No intervention PTA today. Denies recent trauma, fever, redness, numbness, weakness, or swelling. Complained of eye redness to triage, denies this to me.   HPI     Past Medical History:  Diagnosis Date  . BPH (benign prostatic hyperplasia)   . Colon polyp 2010  . Depression   . GERD (gastroesophageal reflux disease)   . Hepatitis C    "caught it when I had a blood transfusion"  . High cholesterol   . History of blood transfusion    "when I was young"  . Hypertension   . Paranoid schizophrenia ([place])   . Prostate atrophy   . Retinal vein occlusion     Patient Active Problem List   Diagnosis Date Noted  . GERD (gastroesophageal reflux disease) 05/03/2019  . Hypotension 08/02/2018  . Low back sprain, initial encounter 10/21/2017  . History of drug abuse ([place]) 10/18/2017  . Gingival erythema 09/23/2017  . Chronic pain of left knee 11/24/2016  . Tobacco abuse   . Diastolic heart failure ([place]) 02/05/2015  . Healthcare maintenance 05/28/2014  . Hyperlipidemia 03/10/2014  . Patellar tendonitis 11/16/2012  . [place] OCCLUSION 06/03/2010  . PARANOID SCHIZOPHRENIA, CHRONIC 01/17/2009  . HYPERTENSION, BENIGN ESSENTIAL 01/17/2009  . BPH (benign prostatic hyperplasia) 01/17/2009  . HEPATITIS C 12/14/2008  . DEPRESSION 12/14/2008    Past Surgical History:  Procedure Laterality Date  . CIRCUMCISION  1959  . TONSILLECTOMY         Family History  Problem Relation Age of Onset  . Hypertension Mother   . Diabetes Mother   . Stroke Mother      Social History   Tobacco Use  . Smoking status: Current Every Day Smoker    Packs/day: 0.50    Years: 48.00    Pack years: 24.00    Types: Cigarettes  . Smokeless tobacco: Never Used  Substance Use Topics  . Alcohol use: Yes  . Drug use: No    Home Medications Prior to Admission medications   Medication Sig Start Date End Date Taking? Authorizing Provider  acetaminophen (TYLENOL) 500 MG tablet Take 1 tablet (500 mg total) by mouth every 6 (six) hours as needed. 04/23/18   [name], [name], PA-C  amLODipine (NORVASC) 10 MG tablet Take 1 tablet (10 mg total) by mouth daily. 04/13/18   [name], DO  aspirin 81 MG tablet Take 1 tablet (81 mg total) by mouth daily. 09/24/14   [name], MD  atorvastatin (LIPITOR) 40 MG tablet Take 1 tablet (40 mg total) by mouth daily. 08/15/19   [name], DO  benztropine (COGENTIN) 1 MG tablet Take 1 tablet (1 mg total) by mouth 2 (two) times daily. 11/05/16   [name], MD  cetirizine (ZYRTEC ALLERGY) 10 MG tablet Take 1 tablet (10 mg total) by mouth daily. 02/01/20   [name], [name], PA-C  famotidine (PEPCID) 20 MG tablet Take 1 tablet (20 mg total) by mouth 2 (two) times daily. 05/02/19   [name], DO  fluPHENAZine decanoate (PROLIXIN) 25 MG/ML injection Inject 25 mg into the  muscle every 14 (fourteen) days. Last dose 09/07/12    [provider]  fluticasone (FLONASE) 50 MCG/ACT nasal spray Place 2 sprays into both nostrils daily. 02/07/20   Nuala Alpha A, PA-C  losartan (COZAAR) 100 MG tablet Take 1 tablet (100 mg total) by mouth daily. 11/28/19   Tammi Klippel, Sherin, DO  mometasone (NASONEX) 50 MCG/ACT nasal spray Place 2 sprays into the nose daily. 12/29/19   [name], PA-C  Multiple Vitamin (MULTIVITAMIN WITH MINERALS) TABS tablet Take 1 tablet by mouth daily. 06/28/14   [name], MD  sodium chloride (OCEAN) 0.65 % SOLN nasal spray Place 1 spray into both nostrils as needed for congestion. 11/19/19    [name], PA-C  tamsulosin (FLOMAX) 0.4 MG CAPS capsule Take 2 capsules (0.8 mg total) by mouth daily. 11/27/17   [name], MD  tamsulosin (FLOMAX) 0.4 MG CAPS capsule TAKE 2 CAPSULE BY MOUTH DAILY 01/09/20   [name], DO  traMADol (ULTRAM) 50 MG tablet Take 1 tablet (50 mg total) by mouth every 6 (six) hours as needed. 07/04/18   [name], MD  triamcinolone cream (KENALOG) 0.1 % Apply 1 application topically 2 (two) times daily. For 5 days, then twice daily as needed for rash and itching. 01/03/15   [name], MD  vitamin C (ASCORBIC ACID) 500 MG tablet Take 500 mg by mouth daily.    [provider]  loratadine (CLARITIN) 10 MG tablet Take 1 tablet (10 mg total) by mouth daily. 12/28/19 02/05/20  [name], DO    Allergies    Lisinopril, Penicillins, Amoxicillin, and Ibuprofen  Review of Systems   Review of Systems  Constitutional: Negative for chills and fever.  Eyes: Negative for photophobia, pain, discharge, redness and visual disturbance.  Respiratory: Negative for shortness of breath.   Cardiovascular: Negative for chest pain.  Gastrointestinal: Negative for abdominal pain.  Musculoskeletal: Positive for arthralgias. Negative for joint swelling.  Skin: Negative for color change and wound.  Neurological: Negative for weakness and numbness.    Physical Exam Updated Vital Signs BP (!) 142/68 (BP Location: Left Arm)   Pulse 70   Temp 97.8 F (36.6 C) (Oral)   Resp 16   SpO2 100%   Physical Exam Vitals and nursing note reviewed.  Constitutional:      General: He is not in acute distress.    Appearance: He is not ill-appearing or toxic-appearing.  HENT:     Head: Normocephalic and atraumatic.  Eyes:     General: Vision grossly intact.     Extraocular Movements: Extraocular movements intact.     Conjunctiva/sclera: Conjunctivae normal.     Right eye: Right conjunctiva is not injected. No exudate or hemorrhage.    Left eye:  Left conjunctiva is not injected. No exudate or hemorrhage.    Comments: PERRL. No periorbital erythema/swelling.   Cardiovascular:     Pulses:          Dorsalis pedis pulses are 2+ on the right side and 2+ on the left side.       Posterior tibial pulses are 2+ on the right side and 2+ on the left side.  Pulmonary:     Effort: Pulmonary effort is normal.  Musculoskeletal:     Comments: Lower extremities: No obvious deformity, appreciable swelling, edema, erythema, ecchymosis, warmth, or open wounds. Patient has intact AROM to bilateral hips, knees, ankles, and all digits. No tenderness to palpation on exam.   Skin:  General: Skin is warm and dry.     Capillary Refill: Capillary refill takes less than 2 seconds.  Neurological:     Mental Status: He is alert.     Comments: Alert. Clear speech. Sensation grossly intact to bilateral lower extremities. 5/5 strength with plantar/dorsiflexion bilaterally. Patient ambulatory.  Psychiatric:        Mood and Affect: Mood normal.        Behavior: Behavior normal.     ED Results / Procedures / Treatments   Labs (all labs ordered are listed, but only abnormal results are displayed) Labs Reviewed - No data to display  EKG None  Radiology No results found.  Procedures Procedures (including critical care time)  Medications Ordered in ED Medications - No data to display  ED Course  I have reviewed the triage vital signs and the nursing notes.  Pertinent labs & imaging results that were available during my care of the patient were reviewed by me and considered in my medical decision making (see chart for details).    MDM Rules/Calculators/A&P                         Patient presents to the emergency department with complaints of left knee pain.Nontoxic, resting comfortably, vitals with mildly elevated BP, low suspicion for hypertensive emergency. No recent trauma, no focal bony tenderness, doubt fracture or dislocation. No asymmetric  edema, no calf tenderness, doubt DVT. Good symmetric pulses, not cool to the touch, doubt arterial emboli. No erythema/warmth or ROM limitation- doubt septic joint. Hx of chronic knee pain, frequent ED visits for multiple complaints including knee pain. He mentioned some eye redness to triage, denied to me, eye exam benign on my assessment- no signs of periorbital/orbital cellulitis, conjunctivitis, or gross visual deficit. Will discharge at this time.  I discussed treatment plan, need for follow-up, and return precautions with the patient. Provided opportunity for questions, patient confirmed understanding and is in agreement with plan.  Final Clinical Impression(s) / ED Diagnoses Final diagnoses:  Chronic pain of left knee    Rx / DC Orders ED Discharge Orders    None       Amaryllis Dyke, PA-C 02/23/20 0388    Mesner, Corene Cornea, MD 02/23/20 [phone]

## 2020-02-23 NOTE — Discharge Instructions (Addendum)
You were seen in the ER today for knee pain.  Please take tylenol as needed per over the counter dosing for pain.   Follow up with the [facility] within 3 days. Return to the ER for new or worsening symptoms or any other concerns.

## 2020-02-23 NOTE — ED Triage Notes (Signed)
Pt c/o left knee pain and swelling. Ambulatory without difficulty.

## 2020-02-24 NOTE — Discharge Instructions (Signed)
Thank you for allowing me to care for you today in the Emergency Department.   Follow-up with primary care as needed.  Return the emergency department for new or worsening symptoms.

## 2020-02-24 NOTE — ED Notes (Signed)
Woke [name] up and gave him a lunch bag w/ a drink.  Pt refused discharge vitals.

## 2020-02-24 NOTE — ED Provider Notes (Signed)
[facility] EMERGENCY DEPARTMENT Provider Note   CSN: [id number] Arrival date & time: 02/23/20  2201     History Chief Complaint  Patient presents with  . Knee Pain    [name] is a 69 y.o. male with a history of paranoid schizophrenia, hypertension, hypercholesteremia, and frequent ER visits who presents the emergency department with a chief complaint of left knee pain.  He is unable to state when his left knee began hurting.  He was able to ambulate to [employer] after arriving to the ER.  No new falls or injuries.  No numbness or weakness.  No fever, chills, redness, or swelling.  No treatment prior to arrival.  The history is provided by the patient. No language interpreter was used.       Past Medical History:  Diagnosis Date  . BPH (benign prostatic hyperplasia)   . Colon polyp 2010  . Depression   . GERD (gastroesophageal reflux disease)   . Hepatitis C    "caught it when I had a blood transfusion"  . High cholesterol   . History of blood transfusion    "when I was young"  . Hypertension   . Paranoid schizophrenia ([place])   . Prostate atrophy   . Retinal vein occlusion     Patient Active Problem List   Diagnosis Date Noted  . GERD (gastroesophageal reflux disease) 05/03/2019  . Hypotension 08/02/2018  . Low back sprain, initial encounter 10/21/2017  . History of drug abuse ([place]) 10/18/2017  . Gingival erythema 09/23/2017  . Chronic pain of left knee 11/24/2016  . Tobacco abuse   . Diastolic heart failure ([place]) 02/05/2015  . Healthcare maintenance 05/28/2014  . Hyperlipidemia 03/10/2014  . Patellar tendonitis 11/16/2012  . [place] OCCLUSION 06/03/2010  . PARANOID SCHIZOPHRENIA, CHRONIC 01/17/2009  . HYPERTENSION, BENIGN ESSENTIAL 01/17/2009  . BPH (benign prostatic hyperplasia) 01/17/2009  . HEPATITIS C 12/14/2008  . DEPRESSION 12/14/2008    Past Surgical History:  Procedure Laterality Date  . CIRCUMCISION  1959  .  TONSILLECTOMY         Family History  Problem Relation Age of Onset  . Hypertension Mother   . Diabetes Mother   . Stroke Mother     Social History   Tobacco Use  . Smoking status: Current Every Day Smoker    Packs/day: 0.50    Years: 48.00    Pack years: 24.00    Types: Cigarettes  . Smokeless tobacco: Never Used  Substance Use Topics  . Alcohol use: Yes  . Drug use: No    Home Medications Prior to Admission medications   Medication Sig Start Date End Date Taking? Authorizing Provider  acetaminophen (TYLENOL) 500 MG tablet Take 1 tablet (500 mg total) by mouth every 6 (six) hours as needed. 04/23/18   [name], [name], PA-C  amLODipine (NORVASC) 10 MG tablet Take 1 tablet (10 mg total) by mouth daily. 04/13/18   [name], DO  aspirin 81 MG tablet Take 1 tablet (81 mg total) by mouth daily. 09/24/14   [name], MD  atorvastatin (LIPITOR) 40 MG tablet Take 1 tablet (40 mg total) by mouth daily. 08/15/19   [name], DO  benztropine (COGENTIN) 1 MG tablet Take 1 tablet (1 mg total) by mouth 2 (two) times daily. 11/05/16   [name], MD  cetirizine (ZYRTEC ALLERGY) 10 MG tablet Take 1 tablet (10 mg total) by mouth daily. 02/01/20   Renita Papa, PA-C  famotidine (PEPCID) 20 MG tablet Take 1 tablet (20 mg total) by mouth 2 (two) times daily. 05/02/19   [name], DO  fluPHENAZine decanoate (PROLIXIN) 25 MG/ML injection Inject 25 mg into the muscle every 14 (fourteen) days. Last dose 09/07/12    [provider]  fluticasone (FLONASE) 50 MCG/ACT nasal spray Place 2 sprays into both nostrils daily. 02/07/20   Nuala Alpha A, PA-C  losartan (COZAAR) 100 MG tablet Take 1 tablet (100 mg total) by mouth daily. 11/28/19   Tammi Klippel, Sherin, DO  mometasone (NASONEX) 50 MCG/ACT nasal spray Place 2 sprays into the nose daily. 12/29/19   [name], PA-C  Multiple Vitamin (MULTIVITAMIN WITH MINERALS) TABS tablet Take 1 tablet by mouth daily. 06/28/14    [name], MD  sodium chloride (OCEAN) 0.65 % SOLN nasal spray Place 1 spray into both nostrils as needed for congestion. 11/19/19   [name], PA-C  tamsulosin (FLOMAX) 0.4 MG CAPS capsule TAKE 2 CAPSULE BY MOUTH DAILY 02/23/20   Mullis, Kiersten P, DO  traMADol (ULTRAM) 50 MG tablet Take 1 tablet (50 mg total) by mouth every 6 (six) hours as needed. 07/04/18   [name], MD  triamcinolone cream (KENALOG) 0.1 % Apply 1 application topically 2 (two) times daily. For 5 days, then twice daily as needed for rash and itching. 01/03/15   [name], MD  vitamin C (ASCORBIC ACID) 500 MG tablet Take 500 mg by mouth daily.    [provider]  loratadine (CLARITIN) 10 MG tablet Take 1 tablet (10 mg total) by mouth daily. 12/28/19 02/05/20  [name], DO    Allergies    Lisinopril, Penicillins, Amoxicillin, and Ibuprofen  Review of Systems   Review of Systems  Constitutional: Negative for activity change, chills and fever.  Respiratory: Negative for shortness of breath.   Cardiovascular: Negative for chest pain.  Gastrointestinal: Negative for abdominal pain.  Musculoskeletal: Positive for arthralgias and myalgias. Negative for back pain, gait problem and joint swelling.  Skin: Negative for rash.  Neurological: Negative for weakness and numbness.    Physical Exam Updated Vital Signs BP (!) 162/94 (BP Location: Left Arm)   Pulse 65   Temp 98.4 F (36.9 C) (Oral)   Resp 16   SpO2 100%   Physical Exam Vitals and nursing note reviewed.  Constitutional:      Appearance: He is well-developed.  HENT:     Head: Normocephalic.  Eyes:     Conjunctiva/sclera: Conjunctivae normal.  Cardiovascular:     Rate and Rhythm: Normal rate and regular rhythm.     Heart sounds: No murmur heard.   Pulmonary:     Effort: Pulmonary effort is normal.  Abdominal:     General: There is no distension.     Palpations: Abdomen is soft.  Musculoskeletal:     Cervical back: Neck  supple.     Comments: Normal exam of the bilateral lower extremities.  Neurovascular intact.  Ambulates without antalgic gait.  Skin:    General: Skin is warm and dry.  Neurological:     Mental Status: He is alert.  Psychiatric:        Behavior: Behavior normal.     ED Results / Procedures / Treatments   Labs (all labs ordered are listed, but only abnormal results are displayed) Labs Reviewed - No data to display  EKG None  Radiology No results found.  Procedures Procedures (including critical care time)  Medications Ordered in ED Medications -  No data to display  ED Course  I have reviewed the triage vital signs and the nursing notes.  Pertinent labs & imaging results that were available during my care of the patient were reviewed by me and considered in my medical decision making (see chart for details).    MDM Rules/Calculators/A&P                          69 year old male with a history of paranoid schizophrenia, hypertension, hypercholesteremia who is well-known to this emergency department with 85 visits and no admissions in the last 6 months.  He is presenting with left knee pain, which I have personally seen and evaluated him for 2 previous times in the last week.  Physical exam is unremarkable.  He notably ambulated to [employer] in the hospital independently after arriving to the ER.  Doubt gout, septic joint, or fracture.  He is safe for discharge to home with outpatient follow-up.  Final Clinical Impression(s) / ED Diagnoses Final diagnoses:  Chronic pain of left knee    Rx / DC Orders ED Discharge Orders    None       Joanne Gavel, PA-C 02/24/20 0908    Ripley Fraise, MD 02/24/20 2352

## 2020-02-25 ENCOUNTER — Emergency Department (HOSPITAL_COMMUNITY)
Admission: EM | Admit: 2020-02-25 | Discharge: 2020-02-25 | Disposition: A | Discharge status: Home / Self Care | Payer: Medicare Other | Attending: Emergency Medicine | Admitting: Emergency Medicine

## 2020-02-25 ENCOUNTER — Other Ambulatory Visit: Payer: Self-pay

## 2020-02-25 DIAGNOSIS — Z7982 Long term (current) use of aspirin: Secondary | ICD-10-CM | POA: Diagnosis not present

## 2020-02-25 DIAGNOSIS — I110 Hypertensive heart disease with heart failure: Secondary | ICD-10-CM | POA: Diagnosis not present

## 2020-02-25 DIAGNOSIS — F17210 Nicotine dependence, cigarettes, uncomplicated: Secondary | ICD-10-CM | POA: Diagnosis not present

## 2020-02-25 DIAGNOSIS — M25562 Pain in left knee: Secondary | ICD-10-CM | POA: Insufficient documentation

## 2020-02-25 DIAGNOSIS — Z79899 Other long term (current) drug therapy: Secondary | ICD-10-CM | POA: Insufficient documentation

## 2020-02-25 DIAGNOSIS — G8929 Other chronic pain: Secondary | ICD-10-CM | POA: Diagnosis not present

## 2020-02-25 DIAGNOSIS — I5030 Unspecified diastolic (congestive) heart failure: Secondary | ICD-10-CM | POA: Diagnosis not present

## 2020-02-25 NOTE — ED Triage Notes (Signed)
Pt has knee pain and sinus issues.

## 2020-02-25 NOTE — ED Provider Notes (Signed)
[facility] EMERGENCY DEPARTMENT Provider Note   CSN: [id number] Arrival date & time: 02/25/20  0342     History Chief Complaint  Patient presents with  . Knee Pain    RICCI DIROCCO is a 69 y.o. male with pertinent past medical history of hypertension, paranoid schizophrenia, depression that presents the emergency department today for knee pain.  Patient has had 86 visits in the last 6 months, many of them being for left knee pain.  States that his left knee hurts, denies any more description than this.  Denies any fevers, chills, redness, swelling, falls, injuries.  Patient states that he was able to walk on it today.  Is unsure when the pain started, denies any paresthesias or pain radiating down his leg.  Has been taking Tylenol which has provided some relief. HPI     Past Medical History:  Diagnosis Date  . BPH (benign prostatic hyperplasia)   . Colon polyp 2010  . Depression   . GERD (gastroesophageal reflux disease)   . Hepatitis C    "caught it when I had a blood transfusion"  . High cholesterol   . History of blood transfusion    "when I was young"  . Hypertension   . Paranoid schizophrenia ([place])   . Prostate atrophy   . Retinal vein occlusion     Patient Active Problem List   Diagnosis Date Noted  . GERD (gastroesophageal reflux disease) 05/03/2019  . Hypotension 08/02/2018  . Low back sprain, initial encounter 10/21/2017  . History of drug abuse ([place]) 10/18/2017  . Gingival erythema 09/23/2017  . Chronic pain of left knee 11/24/2016  . Tobacco abuse   . Diastolic heart failure ([place]) 02/05/2015  . Healthcare maintenance 05/28/2014  . Hyperlipidemia 03/10/2014  . Patellar tendonitis 11/16/2012  . [place] OCCLUSION 06/03/2010  . PARANOID SCHIZOPHRENIA, CHRONIC 01/17/2009  . HYPERTENSION, BENIGN ESSENTIAL 01/17/2009  . BPH (benign prostatic hyperplasia) 01/17/2009  . HEPATITIS C 12/14/2008  . DEPRESSION 12/14/2008    Past  Surgical History:  Procedure Laterality Date  . CIRCUMCISION  1959  . TONSILLECTOMY         Family History  Problem Relation Age of Onset  . Hypertension Mother   . Diabetes Mother   . Stroke Mother     Social History   Tobacco Use  . Smoking status: Current Every Day Smoker    Packs/day: 0.50    Years: 48.00    Pack years: 24.00    Types: Cigarettes  . Smokeless tobacco: Never Used  Substance Use Topics  . Alcohol use: Yes  . Drug use: No    Home Medications Prior to Admission medications   Medication Sig Start Date End Date Taking? Authorizing Provider  aspirin 81 MG tablet Take 1 tablet (81 mg total) by mouth daily. 09/24/14  Yes Bacigalupo, Dionne Bucy, MD  benztropine (COGENTIN) 1 MG tablet Take 1 tablet (1 mg total) by mouth 2 (two) times daily. 11/05/16  Yes [name], MD  cetirizine (ZYRTEC ALLERGY) 10 MG tablet Take 1 tablet (10 mg total) by mouth daily. 02/01/20  Yes [name], [name], PA-C  fluticasone (FLONASE) 50 MCG/ACT nasal spray Place 2 sprays into both nostrils daily. 02/07/20  Yes Nuala Alpha A, PA-C  hydrOXYzine (VISTARIL) 50 MG capsule Take 50 mg by mouth at bedtime. 02/01/20  Yes [provider]  losartan (COZAAR) 100 MG tablet Take 1 tablet (100 mg total) by mouth daily. 11/28/19  Yes [name],  DO  mometasone (NASONEX) 50 MCG/ACT nasal spray Place 2 sprays into the nose daily. 12/29/19  Yes [name], PA-C  sodium chloride (OCEAN) 0.65 % SOLN nasal spray Place 1 spray into both nostrils as needed for congestion. 11/19/19  Yes [name], PA-C  tamsulosin (FLOMAX) 0.4 MG CAPS capsule TAKE 2 CAPSULE BY MOUTH DAILY Patient taking differently: Take 0.8 mg by mouth daily.  02/23/20  Yes Mullis, Kiersten P, DO  vitamin C (ASCORBIC ACID) 500 MG tablet Take 500 mg by mouth daily.   Yes [provider]  acetaminophen (TYLENOL) 500 MG tablet Take 1 tablet (500 mg total) by mouth every 6 (six) hours as needed. Patient not taking:  Reported on 02/25/2020 04/23/18   Rodell Perna A, PA-C  amLODipine (NORVASC) 10 MG tablet Take 1 tablet (10 mg total) by mouth daily. Patient not taking: Reported on 02/25/2020 04/13/18   [name], DO  atorvastatin (LIPITOR) 40 MG tablet Take 1 tablet (40 mg total) by mouth daily. 08/15/19   [name], DO  famotidine (PEPCID) 20 MG tablet Take 1 tablet (20 mg total) by mouth 2 (two) times daily. 05/02/19   [name], DO  fluPHENAZine decanoate (PROLIXIN) 25 MG/ML injection Inject 25 mg into the muscle every 14 (fourteen) days. Last dose 09/07/12    [provider]  Multiple Vitamin (MULTIVITAMIN WITH MINERALS) TABS tablet Take 1 tablet by mouth daily. Patient not taking: Reported on 02/25/2020 06/28/14   [name], MD  traMADol (ULTRAM) 50 MG tablet Take 1 tablet (50 mg total) by mouth every 6 (six) hours as needed. Patient not taking: Reported on 02/25/2020 07/04/18   [name], MD  triamcinolone cream (KENALOG) 0.1 % Apply 1 application topically 2 (two) times daily. For 5 days, then twice daily as needed for rash and itching. Patient not taking: Reported on 02/25/2020 01/03/15   [name], MD  loratadine (CLARITIN) 10 MG tablet Take 1 tablet (10 mg total) by mouth daily. 12/28/19 02/05/20  [name], DO    Allergies    Lisinopril, Penicillins, Amoxicillin, and Ibuprofen  Review of Systems   Review of Systems  Constitutional: Negative for diaphoresis, fatigue and fever.  Eyes: Negative for visual disturbance.  Respiratory: Negative for shortness of breath.   Cardiovascular: Negative for chest pain.  Gastrointestinal: Negative for nausea and vomiting.  Musculoskeletal: Positive for arthralgias. Negative for back pain, gait problem, joint swelling, myalgias, neck pain and neck stiffness.  Skin: Negative for color change, pallor, rash and wound.  Neurological: Negative for syncope, weakness, light-headedness, numbness and headaches.   Psychiatric/Behavioral: Negative for behavioral problems and confusion.    Physical Exam Updated Vital Signs BP (!) 176/91 (BP Location: Left Arm)   Pulse (!) 51   Temp 97.9 F (36.6 C) (Oral)   Resp 16   Ht 5\' 9"  (1.753 m)   Wt 74 kg   SpO2 100%   BMI 24.09 kg/m   Physical Exam Constitutional:      General: He is not in acute distress.    Appearance: Normal appearance. He is not ill-appearing, toxic-appearing or diaphoretic.  HENT:     Head: Normocephalic and atraumatic.  Cardiovascular:     Rate and Rhythm: Normal rate and regular rhythm.     Pulses: Normal pulses.  Pulmonary:     Effort: Pulmonary effort is normal.     Breath sounds: Normal breath sounds.  Musculoskeletal:        General: Normal range of motion.  Cervical back: Normal range of motion.     Right knee: Normal.     Left knee: Normal. No swelling, deformity, effusion, erythema, ecchymosis, lacerations, bony tenderness or crepitus. Normal range of motion. No tenderness. No LCL laxity, MCL laxity, ACL laxity or PCL laxity.Normal pulse.     Instability Tests: Anterior drawer test negative. Posterior drawer test negative. Medial McMurray test negative and lateral McMurray test negative.     Comments: Patient with left knee pain, no edema or erythema noted.  No overlying abrasions.  No warmth.  no bony tenderness.  Normal laxity test.  Patient is ambulatory in the ER.  Skin:    General: Skin is warm and dry.     Capillary Refill: Capillary refill takes less than 2 seconds.  Neurological:     General: No focal deficit present.     Mental Status: He is alert and oriented to person, place, and time.     Cranial Nerves: No cranial nerve deficit.     Sensory: No sensory deficit.     Motor: No weakness.     Gait: Gait normal.  Psychiatric:        Mood and Affect: Mood normal.        Behavior: Behavior normal.        Thought Content: Thought content normal.     ED Results / Procedures / Treatments   Labs  (all labs ordered are listed, but only abnormal results are displayed) Labs Reviewed - No data to display  EKG None  Radiology No results found.  Procedures Procedures (including critical care time)  Medications Ordered in ED Medications - No data to display  ED Course  I have reviewed the triage vital signs and the nursing notes.  Pertinent labs & imaging results that were available during my care of the patient were reviewed by me and considered in my medical decision making (see chart for details).    MDM Rules/Calculators/A&P                          TREMON SAINVIL is a 69 y.o. male with pertinent past medical history of hypertension, paranoid schizophrenia, depression that presents the emergency department today for knee pain on left side.  Patient with normal exam.  Patient ambulatory.  Patient has been seen 86 times for the same.  Patient states that nothing is changed since last time.  Denies any trauma to the knee.  Has been taking Tylenol which has provided some relief, states that he has enough Tylenol.  Doubt gout, septic joint or fracture.  Did have negative imaging of knee 3 months ago.  Doubt need for further emergent work up at this time. I explained the diagnosis and have given explicit precautions to return to the ER including for any other new or worsening symptoms. The patient understands and accepts the medical plan as it's been dictated and I have answered their questions. Discharge instructions concerning home care and prescriptions have been given. The patient is STABLE and is discharged to home in good condition.   Final Clinical Impression(s) / ED Diagnoses Final diagnoses:  Chronic pain of left knee    Rx / DC Orders ED Discharge Orders    None       Alfredia Client, PA-C 02/25/20 1006    Hayden Rasmussen, MD 02/25/20 [phone]

## 2020-02-25 NOTE — Discharge Instructions (Addendum)
You were seen today for joint pain of your knee, I want you to use Tylenol as prescribed on the bottle.  You can also use ice and heat for this.  Come back to the emergency department for any new or worsening concerning symptoms.  Use the guide attached.

## 2020-02-26 ENCOUNTER — Emergency Department (HOSPITAL_COMMUNITY)
Admission: EM | Admit: 2020-02-26 | Discharge: 2020-02-27 | Disposition: A | Discharge status: Home / Self Care | Payer: Medicare Other | Attending: Emergency Medicine | Admitting: Emergency Medicine

## 2020-02-26 ENCOUNTER — Ambulatory Visit (INDEPENDENT_AMBULATORY_CARE_PROVIDER_SITE_OTHER): Payer: Medicare Other | Admitting: Psychiatry

## 2020-02-26 ENCOUNTER — Encounter (HOSPITAL_COMMUNITY): Payer: Self-pay | Admitting: Psychiatry

## 2020-02-26 ENCOUNTER — Encounter (HOSPITAL_COMMUNITY): Payer: Self-pay | Admitting: Emergency Medicine

## 2020-02-26 ENCOUNTER — Encounter (HOSPITAL_COMMUNITY): Payer: Self-pay

## 2020-02-26 ENCOUNTER — Ambulatory Visit (INDEPENDENT_AMBULATORY_CARE_PROVIDER_SITE_OTHER): Payer: Medicare Other | Admitting: *Deleted

## 2020-02-26 ENCOUNTER — Other Ambulatory Visit: Payer: Self-pay

## 2020-02-26 VITALS — BP 157/96 | HR 61 | Temp 98.2°F | Ht 69.0 in | Wt 149.0 lb

## 2020-02-26 DIAGNOSIS — Z79899 Other long term (current) drug therapy: Secondary | ICD-10-CM | POA: Insufficient documentation

## 2020-02-26 DIAGNOSIS — Z7982 Long term (current) use of aspirin: Secondary | ICD-10-CM | POA: Diagnosis not present

## 2020-02-26 DIAGNOSIS — F17210 Nicotine dependence, cigarettes, uncomplicated: Secondary | ICD-10-CM | POA: Diagnosis not present

## 2020-02-26 DIAGNOSIS — G8929 Other chronic pain: Secondary | ICD-10-CM | POA: Insufficient documentation

## 2020-02-26 DIAGNOSIS — M25562 Pain in left knee: Secondary | ICD-10-CM | POA: Diagnosis present

## 2020-02-26 DIAGNOSIS — I10 Essential (primary) hypertension: Secondary | ICD-10-CM | POA: Insufficient documentation

## 2020-02-26 DIAGNOSIS — F200 Paranoid schizophrenia: Secondary | ICD-10-CM | POA: Diagnosis not present

## 2020-02-26 MED ORDER — BENZTROPINE MESYLATE 1 MG PO TABS: 1.0000 mg | ORAL_TABLET | Freq: Two times a day (BID) | ORAL | 0 refills | Status: AC

## 2020-02-26 MED ORDER — FLUPHENAZINE DECANOATE 25 MG/ML IJ SOLN
25.0000 mg | Freq: Once | INTRAMUSCULAR | Status: AC
  Administered 2020-02-26: 25 mg via INTRAMUSCULAR

## 2020-02-26 MED ORDER — FLUPHENAZINE DECANOATE 25 MG/ML IJ SOLN: 25.0000 mg | INTRAMUSCULAR | 11 refills | Status: DC

## 2020-02-26 NOTE — Progress Notes (Signed)
Psychiatric Initial Adult Assessment   Patient Identification: [name] MRN:  [id number] Date of Evaluation:  02/26/2020 Referral Source: [facility] Chief Complaint: " I am good"  Visit Diagnosis:    ICD-10-CM   1. PARANOID SCHIZOPHRENIA, CHRONIC  F20.0     History of Present Illness: 69 year old male seen today for initial psychiatric evaluation.  Patient was referred to outpatient psychiatry by [facility] for medication management.  He has a psychiatric history of paranoid schizophrenia and depression.  He is currently being managed on Prolixin 25 mg every 14 days and Cogentin 1 mg twice daily.  He reports that he is doing well on current medication regimen.  Patient pleasant during exam however her speech was slurred.  He denies current substance use and informed Probation officer that sober from drugs and alcohol for 13 years.  Today he denies symptoms of depression, anxiety, psychosis, or mania.  He denies SI/HI/VH.  During assessment patient intermittently confused.  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  Patient discussed with provider that he has 3 children two daughters and 1 son whom he reports was shot.  He is agreeable to continuing all medications as prescribed.  No other concerns noted at this time.  Associated Signs/Symptoms: Depression Symptoms:  Denies (Hypo) Manic Symptoms:  Denies Anxiety Symptoms:  Denies Psychotic Symptoms:  Denies PTSD Symptoms: NA  Past Psychiatric History: Depression and paranoid schizophrenia  Previous Psychotropic Medications: No   Substance Abuse History in the last 12 months:  No.  Consequences of Substance Abuse: NA  Past Medical History:  Past Medical History:  Diagnosis Date  . BPH (benign prostatic hyperplasia)   . Colon polyp 2010  . Depression   . GERD (gastroesophageal reflux disease)   . Hepatitis C    "caught it when I had a blood transfusion"  . High cholesterol   . History of blood  transfusion    "when I was young"  . Hypertension   . Paranoid schizophrenia ([place])   . Prostate atrophy   . Retinal vein occlusion     Past Surgical History:  Procedure Laterality Date  . CIRCUMCISION  1959  . TONSILLECTOMY      Family Psychiatric History: Denies  Family History:  Family History  Problem Relation Age of Onset  . Hypertension Mother   . Diabetes Mother   . Stroke Mother     Social History:   Social History   Socioeconomic History  . Marital status: Married    Spouse name: Not on file  . Number of children: Not on file  . Years of education: Not on file  . Highest education level: Not on file  Occupational History  . Not on file  Tobacco Use  . Smoking status: Current Every Day Smoker    Packs/day: 0.50    Years: 48.00    Pack years: 24.00    Types: Cigarettes  . Smokeless tobacco: Never Used  Substance and Sexual Activity  . Alcohol use: Yes  . Drug use: No  . Sexual activity: Yes  Other Topics Concern  . Not on file  Social History Narrative  . Not on file   Social Determinants of Health   Financial Resource Strain:   . Difficulty of Paying Living Expenses:   Food Insecurity:   . Worried About Charity fundraiser in the Last Year:   . Arboriculturist in the Last Year:   Transportation Needs:   .  Lack of Transportation (Medical):   Marland Kitchen Lack of Transportation (Non-Medical):   Physical Activity:   . Days of Exercise per Week:   . Minutes of Exercise per Session:   Stress:   . Feeling of Stress :   Social Connections:   . Frequency of Communication with Friends and Family:   . Frequency of Social Gatherings with Friends and Family:   . Attends Religious Services:   . Active Member of Clubs or Organizations:   . Attends Archivist Meetings:   Marland Kitchen Marital Status:     Additional Social History: Patient reports he resides in [place].  He notes that he is single and has 3 children (2 daughters and 1 son who is deceased). He  denies tobacco, alcohol, or illicit drug use.  Allergies:   Allergies  Allergen Reactions  . Lisinopril Other (See Comments)    Cough  . Penicillins Hives  . Amoxicillin Rash  . Ibuprofen Rash    Metabolic Disorder Labs: Lab Results  Component Value Date   HGBA1C 5.4 08/21/2016   MPG 108 08/21/2016   No results found for: PROLACTIN Lab Results  Component Value Date   CHOL 100 05/02/2019   TRIG 22 05/02/2019   HDL 54 05/02/2019   CHOLHDL 1.9 05/02/2019   VLDL 10 08/21/2016   LDLCALC 37 05/02/2019   LDLCALC 37 01/05/2018   Lab Results  Component Value Date   TSH 0.856 08/21/2016    Therapeutic Level Labs: No results found for: LITHIUM No results found for: CBMZ No results found for: VALPROATE  Current Medications: Current Outpatient Medications  Medication Sig Dispense Refill  . acetaminophen (TYLENOL) 500 MG tablet Take 1 tablet (500 mg total) by mouth every 6 (six) hours as needed. (Patient not taking: Reported on 02/25/2020) 30 tablet 0  . amLODipine (NORVASC) 10 MG tablet Take 1 tablet (10 mg total) by mouth daily. (Patient not taking: Reported on 02/25/2020) 30 tablet 5  . aspirin 81 MG tablet Take 1 tablet (81 mg total) by mouth daily. 30 tablet 6  . atorvastatin (LIPITOR) 40 MG tablet Take 1 tablet (40 mg total) by mouth daily. 90 tablet 3  . benztropine (COGENTIN) 1 MG tablet Take 1 tablet (1 mg total) by mouth 2 (two) times daily. 60 tablet 0  . cetirizine (ZYRTEC ALLERGY) 10 MG tablet Take 1 tablet (10 mg total) by mouth daily. 30 tablet 1  . famotidine (PEPCID) 20 MG tablet Take 1 tablet (20 mg total) by mouth 2 (two) times daily. 60 tablet 3  . fluPHENAZine decanoate (PROLIXIN) 25 MG/ML injection Inject 25 mg into the muscle every 14 (fourteen) days. Last dose 09/07/12    . fluticasone (FLONASE) 50 MCG/ACT nasal spray Place 2 sprays into both nostrils daily. 9.9 g 0  . hydrOXYzine (VISTARIL) 50 MG capsule Take 50 mg by mouth at bedtime.    Marland Kitchen losartan  (COZAAR) 100 MG tablet Take 1 tablet (100 mg total) by mouth daily. 90 tablet 1  . mometasone (NASONEX) 50 MCG/ACT nasal spray Place 2 sprays into the nose daily. 17 g 0  . Multiple Vitamin (MULTIVITAMIN WITH MINERALS) TABS tablet Take 1 tablet by mouth daily. (Patient not taking: Reported on 02/25/2020) 30 tablet 6  . sodium chloride (OCEAN) 0.65 % SOLN nasal spray Place 1 spray into both nostrils as needed for congestion. 30 mL 0  . tamsulosin (FLOMAX) 0.4 MG CAPS capsule TAKE 2 CAPSULE BY MOUTH DAILY (Patient taking differently: Take 0.8 mg by mouth  daily. ) 60 capsule 2  . traMADol (ULTRAM) 50 MG tablet Take 1 tablet (50 mg total) by mouth every 6 (six) hours as needed. (Patient not taking: Reported on 02/25/2020) 12 tablet 0  . triamcinolone cream (KENALOG) 0.1 % Apply 1 application topically 2 (two) times daily. For 5 days, then twice daily as needed for rash and itching. (Patient not taking: Reported on 02/25/2020) 30 g 0  . vitamin C (ASCORBIC ACID) 500 MG tablet Take 500 mg by mouth daily.     No current facility-administered medications for this visit.    Musculoskeletal: Strength & Muscle Tone: within normal limits Gait & Station: normal Patient leans: N/A  Psychiatric Specialty Exam: Review of Systems  There were no vitals taken for this visit.There is no height or weight on file to calculate BMI.  General Appearance: Fairly Groomed  Eye Contact:  Good  Speech:  Slurred  Volume:  Normal  Mood:  Euthymic  Affect:  Congruent  Thought Process:  Coherent, Goal Directed and Linear  Orientation:  Full (Time, Place, and Person)  Thought Content:  Logical  Suicidal Thoughts:  No  Homicidal Thoughts:  No  Memory:  Immediate;   Good Recent;   Good Remote;   Good  Judgement:  Fair  Insight:  Fair  Psychomotor Activity:  Normal  Concentration:  Concentration: Good and Attention Span: Good  Recall:  Good  Fund of Knowledge:Fair  Language: Good  Akathisia:  No  Handed:  Right   AIMS (if indicated):  Not done  Assets:  Communication Skills Desire for Improvement Housing  ADL's:  Intact  Cognition: WNL  Sleep:  Good   Screenings: PHQ2-9     ED from 02/10/2020 in [place] from 11/14/2019 in [facility] Visit from 05/02/2019 in [place] [facility] Visit from 04/13/2018 in [place] [facility] Visit from 02/16/2018 in [place]  PHQ-2 Total Score 0 0 0 0 0      Assessment and Plan: Patient reports that he is doing well on current medication regimen.  He is agreeable to continue medications as prescribed.  Follow-up in 3 months   Salley Slaughter, NP 7/12/20211:32 PM

## 2020-02-26 NOTE — ED Triage Notes (Signed)
Patient reports left knee pain onset today , ambulatory/denies injury.

## 2020-02-26 NOTE — Progress Notes (Signed)
Patient arrived today for appointment for Medication Management & Injection.  Patient is very pleasant & talkative this afternoon. Patient tolerated injection FLUPHENAZINE DECANOTE  25 MG/ML well in Right Arm. RTC for next injection in  14 days

## 2020-02-27 ENCOUNTER — Ambulatory Visit (HOSPITAL_COMMUNITY)
Admission: EM | Admit: 2020-02-27 | Discharge: 2020-02-27 | Disposition: A | Discharge status: Home / Self Care | Payer: Medicare Other

## 2020-02-27 DIAGNOSIS — Z765 Malingerer [conscious simulation]: Secondary | ICD-10-CM

## 2020-02-27 NOTE — ED Provider Notes (Signed)
[facility] EMERGENCY DEPARTMENT Provider Note   CSN: [id number] Arrival date & time: 02/26/20  2202     History Chief Complaint  Patient presents with  . Knee Pain    [name] is a 69 y.o. male.  The history is provided by the patient and medical records.  Knee Pain   69 y.o. M with hx BPH, GERD, hep C, HTN, schizophrenia, presenting to the ED with reported knee pain.  During exam with me, patient denies complaints.  States he got a tea and a soda from [employer] and he is doing good now.  He remains ambulatory here in ED.    Past Medical History:  Diagnosis Date  . BPH (benign prostatic hyperplasia)   . Colon polyp 2010  . Depression   . GERD (gastroesophageal reflux disease)   . Hepatitis C    "caught it when I had a blood transfusion"  . High cholesterol   . History of blood transfusion    "when I was young"  . Hypertension   . Paranoid schizophrenia ([place])   . Prostate atrophy   . Retinal vein occlusion     Patient Active Problem List   Diagnosis Date Noted  . GERD (gastroesophageal reflux disease) 05/03/2019  . Hypotension 08/02/2018  . Low back sprain, initial encounter 10/21/2017  . History of drug abuse ([place]) 10/18/2017  . Gingival erythema 09/23/2017  . Chronic pain of left knee 11/24/2016  . Tobacco abuse   . Diastolic heart failure ([place]) 02/05/2015  . Healthcare maintenance 05/28/2014  . Hyperlipidemia 03/10/2014  . Patellar tendonitis 11/16/2012  . [place] OCCLUSION 06/03/2010  . PARANOID SCHIZOPHRENIA, CHRONIC 01/17/2009  . HYPERTENSION, BENIGN ESSENTIAL 01/17/2009  . BPH (benign prostatic hyperplasia) 01/17/2009  . HEPATITIS C 12/14/2008  . DEPRESSION 12/14/2008    Past Surgical History:  Procedure Laterality Date  . CIRCUMCISION  1959  . TONSILLECTOMY         Family History  Problem Relation Age of Onset  . Hypertension Mother   . Diabetes Mother   . Stroke Mother     Social History    Tobacco Use  . Smoking status: Current Every Day Smoker    Packs/day: 0.50    Years: 48.00    Pack years: 24.00    Types: Cigarettes  . Smokeless tobacco: Never Used  Substance Use Topics  . Alcohol use: Yes  . Drug use: No    Home Medications Prior to Admission medications   Medication Sig Start Date End Date Taking? Authorizing Provider  acetaminophen (TYLENOL) 500 MG tablet Take 1 tablet (500 mg total) by mouth every 6 (six) hours as needed. 04/23/18   [name], [name], PA-C  amLODipine (NORVASC) 10 MG tablet Take 1 tablet (10 mg total) by mouth daily. 04/13/18   [name], DO  aspirin 81 MG tablet Take 1 tablet (81 mg total) by mouth daily. 09/24/14   [name], MD  atorvastatin (LIPITOR) 40 MG tablet Take 1 tablet (40 mg total) by mouth daily. 08/15/19   [name], DO  benztropine (COGENTIN) 1 MG tablet Take 1 tablet (1 mg total) by mouth 2 (two) times daily. 02/26/20   Salley Slaughter, NP  cetirizine (ZYRTEC ALLERGY) 10 MG tablet Take 1 tablet (10 mg total) by mouth daily. 02/01/20   [name], [name], PA-C  famotidine (PEPCID) 20 MG tablet Take 1 tablet (20 mg total) by mouth 2 (two) times daily. 05/02/19   Tammi Klippel,  Sherin, DO  fluPHENAZine decanoate (PROLIXIN) 25 MG/ML injection Inject 1 mL (25 mg total) into the muscle every 14 (fourteen) days. Last dose 09/07/12 02/26/20   Salley Slaughter, NP  fluticasone (FLONASE) 50 MCG/ACT nasal spray Place 2 sprays into both nostrils daily. 02/07/20   Nuala Alpha A, PA-C  hydrOXYzine (VISTARIL) 50 MG capsule Take 50 mg by mouth at bedtime. 02/01/20   [provider]  losartan (COZAAR) 100 MG tablet Take 1 tablet (100 mg total) by mouth daily. 11/28/19   Tammi Klippel, Sherin, DO  mometasone (NASONEX) 50 MCG/ACT nasal spray Place 2 sprays into the nose daily. 12/29/19   [name], PA-C  Multiple Vitamin (MULTIVITAMIN WITH MINERALS) TABS tablet Take 1 tablet by mouth daily. 06/28/14   [name], MD   sodium chloride (OCEAN) 0.65 % SOLN nasal spray Place 1 spray into both nostrils as needed for congestion. 11/19/19   [name], PA-C  tamsulosin (FLOMAX) 0.4 MG CAPS capsule TAKE 2 CAPSULE BY MOUTH DAILY Patient taking differently: Take 0.8 mg by mouth daily.  02/23/20   Mullis, Kiersten P, DO  traMADol (ULTRAM) 50 MG tablet Take 1 tablet (50 mg total) by mouth every 6 (six) hours as needed. 07/04/18   [name], MD  triamcinolone cream (KENALOG) 0.1 % Apply 1 application topically 2 (two) times daily. For 5 days, then twice daily as needed for rash and itching. 01/03/15   [name], MD  vitamin C (ASCORBIC ACID) 500 MG tablet Take 500 mg by mouth daily.    [provider]  loratadine (CLARITIN) 10 MG tablet Take 1 tablet (10 mg total) by mouth daily. 12/28/19 02/05/20  [name], DO    Allergies    Lisinopril, Penicillins, Amoxicillin, and Ibuprofen  Review of Systems   Review of Systems  Musculoskeletal: Positive for arthralgias.  All other systems reviewed and are negative.   Physical Exam Updated Vital Signs BP (!) 156/79 (BP Location: Left Arm)   Pulse 74   Temp 98.6 F (37 C) (Oral)   Resp 18   SpO2 100%   Physical Exam Vitals and nursing note reviewed.  Constitutional:      Appearance: He is well-developed.     Comments: Talkative, at baseline  HENT:     Head: Normocephalic and atraumatic.  Eyes:     Conjunctiva/sclera: Conjunctivae normal.     Pupils: Pupils are equal, round, and reactive to light.  Cardiovascular:     Rate and Rhythm: Normal rate and regular rhythm.     Heart sounds: Normal heart sounds.  Pulmonary:     Effort: Pulmonary effort is normal.     Breath sounds: Normal breath sounds.  Abdominal:     General: Bowel sounds are normal.     Palpations: Abdomen is soft.  Musculoskeletal:        General: Normal range of motion.     Cervical back: Normal range of motion.     Comments: Ambulatory, no deformities of the legs noted   Skin:    General: Skin is warm and dry.  Neurological:     Mental Status: He is alert and oriented to person, place, and time.     ED Results / Procedures / Treatments   Labs (all labs ordered are listed, but only abnormal results are displayed) Labs Reviewed - No data to display  EKG None  Radiology No results found.  Procedures Procedures (including critical care time)  Medications Ordered in ED Medications -  No data to display  ED Course  I have reviewed the triage vital signs and the nursing notes.  Pertinent labs & imaging results that were available during my care of the patient were reviewed by me and considered in my medical decision making (see chart for details).    MDM Rules/Calculators/A&P  69 y.o. M here with reported knee pain which is chronic issue for him.  He denies pain to me, states he got a tea and a soda from [employer] so he feels good.  No additional complaints.  He remains ambulatory here in the ED and at his baseline.  Stable for discharge.  Final Clinical Impression(s) / ED Diagnoses Final diagnoses:  Chronic knee pain, unspecified laterality    Rx / DC Orders ED Discharge Orders    None       [name], PA-C 02/27/20 0426    Ward, Delice Bison, DO 02/27/20 [phone]

## 2020-02-27 NOTE — Discharge Instructions (Signed)
Follow-up with your doctor. Return here for new concerns.

## 2020-02-27 NOTE — Discharge Instructions (Addendum)
Continue to follow up with your outpatient provider

## 2020-02-27 NOTE — ED Notes (Signed)
Discharge instructions explained and reviewed with patient. All questions answered. Patient states understanding. VSS. Patient discharged from facility

## 2020-02-27 NOTE — ED Provider Notes (Signed)
Behavioral Health Medical Screening Exam  LAMAJ [name] is a 69 y.o. male. Patient denies SI, HI, AVH. States that he would like to take a shower and change his clothes. Patient allowed to shower.   Total Time spent with patient: 15 minutes  Psychiatric Specialty Exam  Presentation  General Appearance:Appropriate for Environment;Casual;Fairly Groomed  Eye Contact:Good  Speech:Normal Rate  Speech Volume:Normal  Handedness:Right   Mood and Affect  Mood:Euthymic  Affect:Congruent   Thought Process  Thought Processes:Coherent  Descriptions of Associations:Intact  Orientation:Full (Time, Place and Person)  Thought Content:Logical  Hallucinations:None  Ideas of Reference:None  Suicidal Thoughts:No  Homicidal Thoughts:No   Sensorium  Memory:Immediate Good;Recent Good  Judgment:Good  Insight:Fair   Executive Functions  Concentration:Good  Attention Span:Good  [place];Resilience;Housing;Leisure Time   Sleep  Sleep:Fair  Number of hours: No data recorded  Physical Exam: Physical Exam Constitutional:      General: He is not in acute distress.    Appearance: He is not ill-appearing, toxic-appearing or diaphoretic.  HENT:     Right Ear: External ear normal.     Left Ear: External ear normal.  Cardiovascular:     Rate and Rhythm: Normal rate.  Pulmonary:     Effort: Pulmonary effort is normal. No respiratory distress.  Skin:    General: Skin is warm and dry.  Neurological:     Mental Status: He is alert and oriented to person, place, and time.  Psychiatric:        Attention and Perception: He is attentive. He does not perceive auditory or visual hallucinations.        Mood and Affect: Mood is not anxious or depressed.        Behavior: Behavior is cooperative.        Thought Content: Thought content is not  paranoid. Thought content does not include homicidal or suicidal ideation.    Review of Systems  Constitutional: Negative for chills, diaphoresis, fever, malaise/fatigue and weight loss.  Musculoskeletal: Positive for joint pain.       Patient was evaluated in the ED for knee pain.  Psychiatric/Behavioral: Negative for depression, hallucinations and suicidal ideas. The patient has insomnia. The patient is not nervous/anxious.    Blood pressure (!) 146/94, pulse 72, temperature 98.4 F (36.9 C), resp. rate 18, SpO2 98 %. There is no height or weight on file to calculate BMI.  Musculoskeletal: Strength & Muscle Tone: within normal limits Gait & Station: normal Patient leans: N/A   Recommendations:  Based on my evaluation the patient does not appear to have an emergency medical condition.  Rozetta Nunnery, NP 02/27/2020, 5:25 AM

## 2020-02-28 ENCOUNTER — Emergency Department (HOSPITAL_COMMUNITY)
Admission: EM | Admit: 2020-02-28 | Discharge: 2020-02-29 | Disposition: A | Discharge status: Home / Self Care | Payer: Medicare Other | Source: Home / Self Care | Attending: Emergency Medicine | Admitting: Emergency Medicine

## 2020-02-28 ENCOUNTER — Other Ambulatory Visit: Payer: Self-pay

## 2020-02-28 ENCOUNTER — Encounter (HOSPITAL_COMMUNITY): Payer: Self-pay | Admitting: Emergency Medicine

## 2020-02-28 ENCOUNTER — Emergency Department (HOSPITAL_COMMUNITY)
Admission: EM | Admit: 2020-02-28 | Discharge: 2020-02-28 | Disposition: A | Discharge status: Home / Self Care | Payer: Medicare Other | Attending: Emergency Medicine | Admitting: Emergency Medicine

## 2020-02-28 DIAGNOSIS — Z79899 Other long term (current) drug therapy: Secondary | ICD-10-CM | POA: Insufficient documentation

## 2020-02-28 DIAGNOSIS — M25562 Pain in left knee: Secondary | ICD-10-CM | POA: Insufficient documentation

## 2020-02-28 DIAGNOSIS — Z5321 Procedure and treatment not carried out due to patient leaving prior to being seen by health care provider: Secondary | ICD-10-CM | POA: Insufficient documentation

## 2020-02-28 DIAGNOSIS — I110 Hypertensive heart disease with heart failure: Secondary | ICD-10-CM | POA: Insufficient documentation

## 2020-02-28 DIAGNOSIS — Z7982 Long term (current) use of aspirin: Secondary | ICD-10-CM | POA: Insufficient documentation

## 2020-02-28 DIAGNOSIS — I5030 Unspecified diastolic (congestive) heart failure: Secondary | ICD-10-CM | POA: Insufficient documentation

## 2020-02-28 DIAGNOSIS — R52 Pain, unspecified: Secondary | ICD-10-CM

## 2020-02-28 DIAGNOSIS — F17210 Nicotine dependence, cigarettes, uncomplicated: Secondary | ICD-10-CM | POA: Insufficient documentation

## 2020-02-28 NOTE — ED Triage Notes (Signed)
Pt reports body pain all over. Also c/o L knee pain.

## 2020-02-28 NOTE — ED Triage Notes (Signed)
Pt reports left knee pain.  No other complaints

## 2020-02-29 NOTE — ED Notes (Signed)
Verbalized understanding of DC instructions and follow up care. Ambulatory.  

## 2020-02-29 NOTE — ED Provider Notes (Signed)
[facility] EMERGENCY DEPARTMENT Provider Note   CSN: [id number] Arrival date & time: 02/28/20  2236     History Chief Complaint  Patient presents with  . Generalized Body Aches    [name] is a 69 y.o. male.   Muscle Pain This is a new problem. The current episode started 1 to 2 hours ago. The problem occurs constantly. The problem has not changed since onset.Pertinent negatives include no chest pain. Nothing aggravates the symptoms. Nothing relieves the symptoms. He has tried nothing for the symptoms. The treatment provided no relief.       Past Medical History:  Diagnosis Date  . BPH (benign prostatic hyperplasia)   . Colon polyp 2010  . Depression   . GERD (gastroesophageal reflux disease)   . Hepatitis C    "caught it when I had a blood transfusion"  . High cholesterol   . History of blood transfusion    "when I was young"  . Hypertension   . Paranoid schizophrenia ([place])   . Prostate atrophy   . Retinal vein occlusion     Patient Active Problem List   Diagnosis Date Noted  . GERD (gastroesophageal reflux disease) 05/03/2019  . Hypotension 08/02/2018  . Low back sprain, initial encounter 10/21/2017  . History of drug abuse ([place]) 10/18/2017  . Gingival erythema 09/23/2017  . Chronic pain of left knee 11/24/2016  . Tobacco abuse   . Diastolic heart failure ([place]) 02/05/2015  . Healthcare maintenance 05/28/2014  . Hyperlipidemia 03/10/2014  . Patellar tendonitis 11/16/2012  . [place] OCCLUSION 06/03/2010  . PARANOID SCHIZOPHRENIA, CHRONIC 01/17/2009  . HYPERTENSION, BENIGN ESSENTIAL 01/17/2009  . BPH (benign prostatic hyperplasia) 01/17/2009  . HEPATITIS C 12/14/2008  . DEPRESSION 12/14/2008    Past Surgical History:  Procedure Laterality Date  . CIRCUMCISION  1959  . TONSILLECTOMY         Family History  Problem Relation Age of Onset  . Hypertension Mother   . Diabetes Mother   . Stroke Mother     Social  History   Tobacco Use  . Smoking status: Current Every Day Smoker    Packs/day: 0.50    Years: 48.00    Pack years: 24.00    Types: Cigarettes  . Smokeless tobacco: Never Used  Substance Use Topics  . Alcohol use: Yes  . Drug use: No    Home Medications Prior to Admission medications   Medication Sig Start Date End Date Taking? Authorizing Provider  acetaminophen (TYLENOL) 500 MG tablet Take 1 tablet (500 mg total) by mouth every 6 (six) hours as needed. 04/23/18   [name], [name], PA-C  amLODipine (NORVASC) 10 MG tablet Take 1 tablet (10 mg total) by mouth daily. 04/13/18   [name], DO  aspirin 81 MG tablet Take 1 tablet (81 mg total) by mouth daily. 09/24/14   [name], MD  atorvastatin (LIPITOR) 40 MG tablet Take 1 tablet (40 mg total) by mouth daily. 08/15/19   [name], DO  benztropine (COGENTIN) 1 MG tablet Take 1 tablet (1 mg total) by mouth 2 (two) times daily. 02/26/20   Salley Slaughter, NP  cetirizine (ZYRTEC ALLERGY) 10 MG tablet Take 1 tablet (10 mg total) by mouth daily. 02/01/20   [name], [name], PA-C  famotidine (PEPCID) 20 MG tablet Take 1 tablet (20 mg total) by mouth 2 (two) times daily. 05/02/19   [name], DO  fluPHENAZine decanoate (PROLIXIN) 25 MG/ML injection Inject  1 mL (25 mg total) into the muscle every 14 (fourteen) days. Last dose 09/07/12 02/26/20   Salley Slaughter, NP  fluticasone (FLONASE) 50 MCG/ACT nasal spray Place 2 sprays into both nostrils daily. 02/07/20   Nuala Alpha A, PA-C  hydrOXYzine (VISTARIL) 50 MG capsule Take 50 mg by mouth at bedtime. 02/01/20   [provider]  losartan (COZAAR) 100 MG tablet Take 1 tablet (100 mg total) by mouth daily. 11/28/19   Tammi Klippel, Sherin, DO  mometasone (NASONEX) 50 MCG/ACT nasal spray Place 2 sprays into the nose daily. 12/29/19   [name], PA-C  Multiple Vitamin (MULTIVITAMIN WITH MINERALS) TABS tablet Take 1 tablet by mouth daily. 06/28/14   [name], MD   sodium chloride (OCEAN) 0.65 % SOLN nasal spray Place 1 spray into both nostrils as needed for congestion. 11/19/19   [name], PA-C  tamsulosin (FLOMAX) 0.4 MG CAPS capsule TAKE 2 CAPSULE BY MOUTH DAILY Patient taking differently: Take 0.8 mg by mouth daily.  02/23/20   Mullis, Kiersten P, DO  traMADol (ULTRAM) 50 MG tablet Take 1 tablet (50 mg total) by mouth every 6 (six) hours as needed. 07/04/18   [name], MD  triamcinolone cream (KENALOG) 0.1 % Apply 1 application topically 2 (two) times daily. For 5 days, then twice daily as needed for rash and itching. 01/03/15   [name], MD  vitamin C (ASCORBIC ACID) 500 MG tablet Take 500 mg by mouth daily.    [provider]  loratadine (CLARITIN) 10 MG tablet Take 1 tablet (10 mg total) by mouth daily. 12/28/19 02/05/20  [name], DO    Allergies    Lisinopril, Penicillins, Amoxicillin, and Ibuprofen  Review of Systems   Review of Systems  Cardiovascular: Negative for chest pain.  All other systems reviewed and are negative.   Physical Exam Updated Vital Signs BP (!) 170/101 (BP Location: Left Arm)   Pulse 73   Temp 98.2 F (36.8 C) (Oral)   Resp 20   Ht 5\' 9"  (1.753 m)   Wt 68 kg   SpO2 100%   BMI 22.14 kg/m   Physical Exam Vitals and nursing note reviewed.  Constitutional:      Appearance: He is well-developed.  HENT:     Head: Normocephalic and atraumatic.     Mouth/Throat:     Mouth: Mucous membranes are moist.     Pharynx: Oropharynx is clear.  Eyes:     Conjunctiva/sclera: Conjunctivae normal.     Pupils: Pupils are equal, round, and reactive to light.  Cardiovascular:     Rate and Rhythm: Normal rate.  Pulmonary:     Effort: Pulmonary effort is normal. No respiratory distress.  Abdominal:     General: Abdomen is flat. There is no distension.  Musculoskeletal:        General: Normal range of motion.     Cervical back: Normal range of motion.  Skin:    General: Skin is warm and dry.    Neurological:     General: No focal deficit present.     Mental Status: He is alert.     ED Results / Procedures / Treatments   Labs (all labs ordered are listed, but only abnormal results are displayed) Labs Reviewed - No data to display  EKG None  Radiology No results found.  Procedures Procedures (including critical care time)  Medications Ordered in ED Medications - No data to display  ED Course  I have  reviewed the triage vital signs and the nursing notes.  Pertinent labs & imaging results that were available during my care of the patient were reviewed by me and considered in my medical decision making (see chart for details).    MDM Rules/Calculators/A&P                          Sleeping. No complaints. Discharged.   Final Clinical Impression(s) / ED Diagnoses Final diagnoses:  Body aches    Rx / DC Orders ED Discharge Orders    None       Makalia Bare, Corene Cornea, MD 02/29/20 [phone]

## 2020-03-01 ENCOUNTER — Other Ambulatory Visit: Payer: Self-pay

## 2020-03-01 ENCOUNTER — Emergency Department (HOSPITAL_COMMUNITY)
Admission: EM | Admit: 2020-03-01 | Discharge: 2020-03-01 | Disposition: A | Discharge status: Home / Self Care | Payer: Medicare Other | Attending: Emergency Medicine | Admitting: Emergency Medicine

## 2020-03-01 ENCOUNTER — Encounter (HOSPITAL_COMMUNITY): Payer: Self-pay | Admitting: Emergency Medicine

## 2020-03-01 DIAGNOSIS — F17210 Nicotine dependence, cigarettes, uncomplicated: Secondary | ICD-10-CM | POA: Diagnosis not present

## 2020-03-01 DIAGNOSIS — I5030 Unspecified diastolic (congestive) heart failure: Secondary | ICD-10-CM | POA: Insufficient documentation

## 2020-03-01 DIAGNOSIS — I110 Hypertensive heart disease with heart failure: Secondary | ICD-10-CM | POA: Diagnosis not present

## 2020-03-01 DIAGNOSIS — Z7982 Long term (current) use of aspirin: Secondary | ICD-10-CM | POA: Insufficient documentation

## 2020-03-01 DIAGNOSIS — M79672 Pain in left foot: Secondary | ICD-10-CM | POA: Insufficient documentation

## 2020-03-01 DIAGNOSIS — Z79899 Other long term (current) drug therapy: Secondary | ICD-10-CM | POA: Diagnosis not present

## 2020-03-01 NOTE — ED Provider Notes (Signed)
[facility] EMERGENCY DEPARTMENT Provider Note  CSN: [id number] Arrival date & time: 03/01/20 0038  Chief Complaint(s) Knee Pain  HPI [name] is a 69 y.o. male initial CC of knee pain, however his CC to me was left foot pain. Mild and achy on dorsum. No trauma. Otherwise no other complaints stating he was "alright."  HPI  Past Medical History Past Medical History:  Diagnosis Date  . BPH (benign prostatic hyperplasia)   . Colon polyp 2010  . Depression   . GERD (gastroesophageal reflux disease)   . Hepatitis C    "caught it when I had a blood transfusion"  . High cholesterol   . History of blood transfusion    "when I was young"  . Hypertension   . Paranoid schizophrenia ([place])   . Prostate atrophy   . Retinal vein occlusion    Patient Active Problem List   Diagnosis Date Noted  . GERD (gastroesophageal reflux disease) 05/03/2019  . Hypotension 08/02/2018  . Low back sprain, initial encounter 10/21/2017  . History of drug abuse ([place]) 10/18/2017  . Gingival erythema 09/23/2017  . Chronic pain of left knee 11/24/2016  . Tobacco abuse   . Diastolic heart failure ([place]) 02/05/2015  . Healthcare maintenance 05/28/2014  . Hyperlipidemia 03/10/2014  . Patellar tendonitis 11/16/2012  . [place] OCCLUSION 06/03/2010  . PARANOID SCHIZOPHRENIA, CHRONIC 01/17/2009  . HYPERTENSION, BENIGN ESSENTIAL 01/17/2009  . BPH (benign prostatic hyperplasia) 01/17/2009  . HEPATITIS C 12/14/2008  . DEPRESSION 12/14/2008   Home Medication(s) Prior to Admission medications   Medication Sig Start Date End Date Taking? Authorizing Provider  acetaminophen (TYLENOL) 500 MG tablet Take 1 tablet (500 mg total) by mouth every 6 (six) hours as needed. 04/23/18   [name], [name], PA-C  amLODipine (NORVASC) 10 MG tablet Take 1 tablet (10 mg total) by mouth daily. 04/13/18   [name], DO  aspirin 81 MG tablet Take 1 tablet (81 mg total) by mouth daily. 09/24/14    [name], MD  atorvastatin (LIPITOR) 40 MG tablet Take 1 tablet (40 mg total) by mouth daily. 08/15/19   [name], DO  benztropine (COGENTIN) 1 MG tablet Take 1 tablet (1 mg total) by mouth 2 (two) times daily. 02/26/20   Salley Slaughter, NP  cetirizine (ZYRTEC ALLERGY) 10 MG tablet Take 1 tablet (10 mg total) by mouth daily. 02/01/20   [name], [name], PA-C  famotidine (PEPCID) 20 MG tablet Take 1 tablet (20 mg total) by mouth 2 (two) times daily. 05/02/19   [name], DO  fluPHENAZine decanoate (PROLIXIN) 25 MG/ML injection Inject 1 mL (25 mg total) into the muscle every 14 (fourteen) days. Last dose 09/07/12 02/26/20   Salley Slaughter, NP  fluticasone (FLONASE) 50 MCG/ACT nasal spray Place 2 sprays into both nostrils daily. 02/07/20   Nuala Alpha A, PA-C  hydrOXYzine (VISTARIL) 50 MG capsule Take 50 mg by mouth at bedtime. 02/01/20   [provider]  losartan (COZAAR) 100 MG tablet Take 1 tablet (100 mg total) by mouth daily. 11/28/19   Tammi Klippel, Sherin, DO  mometasone (NASONEX) 50 MCG/ACT nasal spray Place 2 sprays into the nose daily. 12/29/19   [name], PA-C  Multiple Vitamin (MULTIVITAMIN WITH MINERALS) TABS tablet Take 1 tablet by mouth daily. 06/28/14   [name], MD  sodium chloride (OCEAN) 0.65 % SOLN nasal spray Place 1 spray into both nostrils as needed for congestion. 11/19/19   Marlon Pel,  Herbie [place], PA-C  tamsulosin (FLOMAX) 0.4 MG CAPS capsule TAKE 2 CAPSULE BY MOUTH DAILY Patient taking differently: Take 0.8 mg by mouth daily.  02/23/20   Mullis, Kiersten P, DO  traMADol (ULTRAM) 50 MG tablet Take 1 tablet (50 mg total) by mouth every 6 (six) hours as needed. 07/04/18   [name], MD  triamcinolone cream (KENALOG) 0.1 % Apply 1 application topically 2 (two) times daily. For 5 days, then twice daily as needed for rash and itching. 01/03/15   [name], MD  vitamin C (ASCORBIC ACID) 500 MG tablet Take 500 mg by mouth daily.     [provider]  loratadine (CLARITIN) 10 MG tablet Take 1 tablet (10 mg total) by mouth daily. 12/28/19 02/05/20  [name], DO                                                                                                                                    Past Surgical History Past Surgical History:  Procedure Laterality Date  . CIRCUMCISION  1959  . TONSILLECTOMY     Family History Family History  Problem Relation Age of Onset  . Hypertension Mother   . Diabetes Mother   . Stroke Mother     Social History Social History   Tobacco Use  . Smoking status: Current Every Day Smoker    Packs/day: 0.50    Years: 48.00    Pack years: 24.00    Types: Cigarettes  . Smokeless tobacco: Never Used  Substance Use Topics  . Alcohol use: Yes  . Drug use: No   Allergies Lisinopril, Penicillins, Amoxicillin, and Ibuprofen  Review of Systems Review of Systems All other systems are reviewed and are negative for acute change except as noted in the HPI  Physical Exam Vital Signs  I have reviewed the triage vital signs BP (!) 172/104 (BP Location: Right Arm)   Pulse 62   Temp 98.3 F (36.8 C) (Oral)   Resp 18   SpO2 100%   Physical Exam Vitals reviewed.  Constitutional:      General: He is not in acute distress.    Appearance: He is well-developed. He is not diaphoretic.  HENT:     Head: Normocephalic and atraumatic.     Jaw: No trismus.     Right Ear: External ear normal.     Left Ear: External ear normal.     Nose: Nose normal.  Eyes:     General: No scleral icterus.    Conjunctiva/sclera: Conjunctivae normal.  Neck:     Trachea: Phonation normal.  Cardiovascular:     Rate and Rhythm: Normal rate and regular rhythm.     Pulses:          Dorsalis pedis pulses are 1+ on the right side and 1+ on the left side.  Pulmonary:     Effort: Pulmonary effort is normal. No respiratory distress.  Breath sounds: No stridor.  Abdominal:     General: There is no  distension.  Musculoskeletal:        General: Normal range of motion.     Cervical back: Normal range of motion.       Feet:  Neurological:     Mental Status: He is alert and oriented to person, place, and time.  Psychiatric:        Behavior: Behavior normal.     ED Results and Treatments Labs (all labs ordered are listed, but only abnormal results are displayed) Labs Reviewed - No data to display                                                                                                                       EKG  EKG Interpretation  Date/Time:    Ventricular Rate:    PR Interval:    QRS Duration:   QT Interval:    QTC Calculation:   R Axis:     Text Interpretation:        Radiology No results found.  Pertinent labs & imaging results that were available during my care of the patient were reviewed by me and considered in my medical decision making (see chart for details).  Medications Ordered in ED Medications - No data to display                                                                                                                                  Procedures Procedures  (including critical care time)  Medical Decision Making / ED Course I have reviewed the nursing notes for this encounter and the patient's prior records (if available in EHR or on provided paperwork).   [name] was evaluated in Emergency Department on 03/01/2020 for the symptoms described in the history of present illness. He was evaluated in the context of the global COVID-19 pandemic, which necessitated consideration that the patient might be at risk for infection with the SARS-CoV-2 virus that causes COVID-19. Institutional protocols and algorithms that pertain to the evaluation of patients at risk for COVID-19 are in a state of rapid change based on information released by regulatory bodies including the CDC and federal and state organizations. These policies and algorithms were  followed during the patient's care in the ED.  No need for imaging.  Not consistent with DVT or occlusion. Given his change in CC, feel  this presentation is consistent with malingering      Final Clinical Impression(s) / ED Diagnoses Final diagnoses:  Left foot pain    The patient appears reasonably screened and/or stabilized for discharge and I doubt any other medical condition or other [facility] requiring further screening, evaluation, or treatment in the ED at this time prior to discharge. Safe for discharge with strict return precautions.  Disposition: Discharge  Condition: Good  I have discussed the results, Dx and Tx plan with the patient/family who expressed understanding and agree(s) with the plan. Discharge instructions discussed at length. The patient/family was given strict return precautions who verbalized understanding of the instructions. No further questions at time of discharge.    ED Discharge Orders    None       Follow Up: Primary care provider  Schedule an appointment as soon as possible for a visit       This chart was dictated using voice recognition software.  Despite best efforts to proofread,  errors can occur which can change the documentation meaning.   Fatima Blank, MD 03/01/20 [phone]

## 2020-03-01 NOTE — ED Triage Notes (Signed)
Patient reports left knee pain, no injury.
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
## 2020-03-02 ENCOUNTER — Other Ambulatory Visit: Payer: Self-pay

## 2020-03-02 ENCOUNTER — Emergency Department (HOSPITAL_COMMUNITY)
Admission: EM | Admit: 2020-03-02 | Discharge: 2020-03-02 | Disposition: A | Discharge status: Home / Self Care | Payer: Medicare Other | Attending: Emergency Medicine | Admitting: Emergency Medicine

## 2020-03-02 DIAGNOSIS — M25569 Pain in unspecified knee: Secondary | ICD-10-CM | POA: Diagnosis not present

## 2020-03-02 DIAGNOSIS — Z5321 Procedure and treatment not carried out due to patient leaving prior to being seen by health care provider: Secondary | ICD-10-CM | POA: Diagnosis not present

## 2020-03-02 NOTE — ED Notes (Signed)
Pt said he will be leaving to go to work. Pt was was encouraged to stay.

## 2020-03-02 NOTE — ED Triage Notes (Signed)
Pt c/o chronic knee pain. Ambulated to triage.

## 2020-03-03 ENCOUNTER — Encounter (HOSPITAL_COMMUNITY): Payer: Self-pay

## 2020-03-03 ENCOUNTER — Emergency Department (HOSPITAL_COMMUNITY)
Admission: EM | Admit: 2020-03-03 | Discharge: 2020-03-03 | Disposition: A | Discharge status: Home / Self Care | Payer: Medicare Other | Attending: Emergency Medicine | Admitting: Emergency Medicine

## 2020-03-03 ENCOUNTER — Emergency Department (HOSPITAL_COMMUNITY)
Admission: EM | Admit: 2020-03-03 | Discharge: 2020-03-03 | Disposition: A | Discharge status: Home / Self Care | Payer: Medicare Other | Source: Home / Self Care | Attending: Emergency Medicine | Admitting: Emergency Medicine

## 2020-03-03 ENCOUNTER — Other Ambulatory Visit: Payer: Self-pay

## 2020-03-03 ENCOUNTER — Encounter (HOSPITAL_COMMUNITY): Payer: Self-pay | Admitting: *Deleted

## 2020-03-03 DIAGNOSIS — F17210 Nicotine dependence, cigarettes, uncomplicated: Secondary | ICD-10-CM | POA: Insufficient documentation

## 2020-03-03 DIAGNOSIS — I5030 Unspecified diastolic (congestive) heart failure: Secondary | ICD-10-CM | POA: Diagnosis not present

## 2020-03-03 DIAGNOSIS — Z7982 Long term (current) use of aspirin: Secondary | ICD-10-CM | POA: Insufficient documentation

## 2020-03-03 DIAGNOSIS — I110 Hypertensive heart disease with heart failure: Secondary | ICD-10-CM | POA: Diagnosis not present

## 2020-03-03 DIAGNOSIS — I10 Essential (primary) hypertension: Secondary | ICD-10-CM | POA: Insufficient documentation

## 2020-03-03 DIAGNOSIS — Z79899 Other long term (current) drug therapy: Secondary | ICD-10-CM | POA: Insufficient documentation

## 2020-03-03 DIAGNOSIS — G8929 Other chronic pain: Secondary | ICD-10-CM

## 2020-03-03 DIAGNOSIS — M25562 Pain in left knee: Secondary | ICD-10-CM | POA: Insufficient documentation

## 2020-03-03 DIAGNOSIS — R638 Other symptoms and signs concerning food and fluid intake: Secondary | ICD-10-CM | POA: Diagnosis not present

## 2020-03-03 DIAGNOSIS — Z86010 Personal history of colonic polyps: Secondary | ICD-10-CM | POA: Diagnosis not present

## 2020-03-03 LAB — CBG MONITORING, ED: Glucose-Capillary: 89 mg/dL (ref 70–99)

## 2020-03-03 MED ORDER — ACETAMINOPHEN 325 MG PO TABS
650.0000 mg | ORAL_TABLET | Freq: Once | ORAL | Status: AC
  Administered 2020-03-03: 650 mg via ORAL
  Filled 2020-03-03: qty 2

## 2020-03-03 NOTE — ED Provider Notes (Signed)
[facility] Provider Note   CSN: [id number] Arrival date & time: 03/03/20  0007     History Chief Complaint  Patient presents with  . Medical Clearance    [name] is a 69 y.o. male.  States he was here for knee and sinus pain to the triage, to the nurse he stated he was thirsty, to me he stated that he needed some medication that he had had.  He did not know the name of the medicine.  He does state he wants some Sprite.  No other complaints other than his chronic congestion and knee pain that he always has.  No recent trauma or changes in those to suggest need for further work-up.        Past Medical History:  Diagnosis Date  . BPH (benign prostatic hyperplasia)   . Colon polyp 2010  . Depression   . GERD (gastroesophageal reflux disease)   . Hepatitis C    "caught it when I had a blood transfusion"  . High cholesterol   . History of blood transfusion    "when I was young"  . Hypertension   . Paranoid schizophrenia ([place])   . Prostate atrophy   . Retinal vein occlusion     Patient Active Problem List   Diagnosis Date Noted  . GERD (gastroesophageal reflux disease) 05/03/2019  . Hypotension 08/02/2018  . Low back sprain, initial encounter 10/21/2017  . History of drug abuse ([place]) 10/18/2017  . Gingival erythema 09/23/2017  . Chronic pain of left knee 11/24/2016  . Tobacco abuse   . Diastolic heart failure ([place]) 02/05/2015  . Healthcare maintenance 05/28/2014  . Hyperlipidemia 03/10/2014  . Patellar tendonitis 11/16/2012  . [place] OCCLUSION 06/03/2010  . PARANOID SCHIZOPHRENIA, CHRONIC 01/17/2009  . HYPERTENSION, BENIGN ESSENTIAL 01/17/2009  . BPH (benign prostatic hyperplasia) 01/17/2009  . HEPATITIS C 12/14/2008  . DEPRESSION 12/14/2008    Past Surgical History:  Procedure Laterality Date  . CIRCUMCISION  1959  . TONSILLECTOMY         Family History  Problem Relation Age of Onset  .  Hypertension Mother   . Diabetes Mother   . Stroke Mother     Social History   Tobacco Use  . Smoking status: Current Every Day Smoker    Packs/day: 0.50    Years: 48.00    Pack years: 24.00    Types: Cigarettes  . Smokeless tobacco: Never Used  Substance Use Topics  . Alcohol use: Yes  . Drug use: No    Home Medications Prior to Admission medications   Medication Sig Start Date End Date Taking? Authorizing Provider  acetaminophen (TYLENOL) 500 MG tablet Take 1 tablet (500 mg total) by mouth every 6 (six) hours as needed. 04/23/18   [name], [name], PA-C  amLODipine (NORVASC) 10 MG tablet Take 1 tablet (10 mg total) by mouth daily. 04/13/18   [name], DO  aspirin 81 MG tablet Take 1 tablet (81 mg total) by mouth daily. 09/24/14   [name], MD  atorvastatin (LIPITOR) 40 MG tablet Take 1 tablet (40 mg total) by mouth daily. 08/15/19   [name], DO  benztropine (COGENTIN) 1 MG tablet Take 1 tablet (1 mg total) by mouth 2 (two) times daily. 02/26/20   Salley Slaughter, NP  cetirizine (ZYRTEC ALLERGY) 10 MG tablet Take 1 tablet (10 mg total) by mouth daily. 02/01/20   [name], [name], PA-C  famotidine (PEPCID) 20  MG tablet Take 1 tablet (20 mg total) by mouth 2 (two) times daily. 05/02/19   [name], DO  fluPHENAZine decanoate (PROLIXIN) 25 MG/ML injection Inject 1 mL (25 mg total) into the muscle every 14 (fourteen) days. Last dose 09/07/12 02/26/20   Salley Slaughter, NP  fluticasone (FLONASE) 50 MCG/ACT nasal spray Place 2 sprays into both nostrils daily. 02/07/20   Nuala Alpha A, PA-C  hydrOXYzine (VISTARIL) 50 MG capsule Take 50 mg by mouth at bedtime. 02/01/20   [provider]  losartan (COZAAR) 100 MG tablet Take 1 tablet (100 mg total) by mouth daily. 11/28/19   Tammi Klippel, Sherin, DO  mometasone (NASONEX) 50 MCG/ACT nasal spray Place 2 sprays into the nose daily. 12/29/19   [name], PA-C  Multiple Vitamin (MULTIVITAMIN WITH MINERALS)  TABS tablet Take 1 tablet by mouth daily. 06/28/14   [name], MD  sodium chloride (OCEAN) 0.65 % SOLN nasal spray Place 1 spray into both nostrils as needed for congestion. 11/19/19   [name], PA-C  tamsulosin (FLOMAX) 0.4 MG CAPS capsule TAKE 2 CAPSULE BY MOUTH DAILY Patient taking differently: Take 0.8 mg by mouth daily.  02/23/20   Mullis, Kiersten P, DO  traMADol (ULTRAM) 50 MG tablet Take 1 tablet (50 mg total) by mouth every 6 (six) hours as needed. 07/04/18   [name], MD  triamcinolone cream (KENALOG) 0.1 % Apply 1 application topically 2 (two) times daily. For 5 days, then twice daily as needed for rash and itching. 01/03/15   [name], MD  vitamin C (ASCORBIC ACID) 500 MG tablet Take 500 mg by mouth daily.    [provider]  loratadine (CLARITIN) 10 MG tablet Take 1 tablet (10 mg total) by mouth daily. 12/28/19 02/05/20  [name], DO    Allergies    Lisinopril, Penicillins, Amoxicillin, and Ibuprofen  Review of Systems   Review of Systems  All other systems reviewed and are negative.   Physical Exam Updated Vital Signs BP (!) 163/91   Pulse 76   Temp 98.2 F (36.8 C) (Oral)   Resp 16   SpO2 98%   Physical Exam Vitals and nursing note reviewed.  Constitutional:      Appearance: He is well-developed.  HENT:     Head: Normocephalic and atraumatic.     Mouth/Throat:     Mouth: Mucous membranes are moist.     Pharynx: Oropharynx is clear.  Eyes:     Extraocular Movements: Extraocular movements intact.  Cardiovascular:     Rate and Rhythm: Normal rate.  Pulmonary:     Effort: Pulmonary effort is normal. No respiratory distress.  Abdominal:     General: There is no distension.  Musculoskeletal:        General: Normal range of motion.     Cervical back: Normal range of motion.  Skin:    General: Skin is warm and dry.  Neurological:     General: No focal deficit present.     Mental Status: He is alert.     ED Results /  Procedures / Treatments   Labs (all labs ordered are listed, but only abnormal results are displayed) Labs Reviewed - No data to display  EKG None  Radiology No results found.  Procedures Procedures (including critical care time)  Medications Ordered in ED Medications - No data to display  ED Course  I have reviewed the triage vital signs and the nursing notes.  Pertinent labs & imaging  results that were available during my care of the patient were reviewed by me and considered in my medical decision making (see chart for details).    MDM Rules/Calculators/A&P                          No apparent emergency medical condition present based on his chief complaint and vital signs and physical exam.  No indication for further work-up at this time.  Will provide a drink.  Final Clinical Impression(s) / ED Diagnoses Final diagnoses:  Effects of thirst    Rx / DC Orders ED Discharge Orders    None       Jakeia Carreras, Corene Cornea, MD 03/03/20 [phone]

## 2020-03-03 NOTE — ED Provider Notes (Signed)
[facility] EMERGENCY DEPARTMENT Provider Note   CSN: [id number] Arrival date & time: 03/03/20  2054     History Chief Complaint  Patient presents with  . Facial Pain    [name] is a 69 y.o. male.  Patient well-known to the ED.  He is here on a daily basis.  Complains of pain to his left knee for the past 2 days to me.  Denies any recent fall or trauma.  States he has been using Tylenol without relief.  He denies that he is here for nasal congestion or facial pain contrary to triage note.  Denies any fevers, chills, nausea, vomiting.  No chest pain or shortness of breath. He states compliance with his medications.  He denies any recent fall or trauma to his knee.  No numbness or tingling.  No weakness  The history is provided by the patient.       Past Medical History:  Diagnosis Date  . BPH (benign prostatic hyperplasia)   . Colon polyp 2010  . Depression   . GERD (gastroesophageal reflux disease)   . Hepatitis C    "caught it when I had a blood transfusion"  . High cholesterol   . History of blood transfusion    "when I was young"  . Hypertension   . Paranoid schizophrenia ([place])   . Prostate atrophy   . Retinal vein occlusion     Patient Active Problem List   Diagnosis Date Noted  . GERD (gastroesophageal reflux disease) 05/03/2019  . Hypotension 08/02/2018  . Low back sprain, initial encounter 10/21/2017  . History of drug abuse ([place]) 10/18/2017  . Gingival erythema 09/23/2017  . Chronic pain of left knee 11/24/2016  . Tobacco abuse   . Diastolic heart failure ([place]) 02/05/2015  . Healthcare maintenance 05/28/2014  . Hyperlipidemia 03/10/2014  . Patellar tendonitis 11/16/2012  . [place] OCCLUSION 06/03/2010  . PARANOID SCHIZOPHRENIA, CHRONIC 01/17/2009  . HYPERTENSION, BENIGN ESSENTIAL 01/17/2009  . BPH (benign prostatic hyperplasia) 01/17/2009  . HEPATITIS C 12/14/2008  . DEPRESSION 12/14/2008    Past Surgical  History:  Procedure Laterality Date  . CIRCUMCISION  1959  . TONSILLECTOMY         Family History  Problem Relation Age of Onset  . Hypertension Mother   . Diabetes Mother   . Stroke Mother     Social History   Tobacco Use  . Smoking status: Current Every Day Smoker    Packs/day: 0.50    Years: 48.00    Pack years: 24.00    Types: Cigarettes  . Smokeless tobacco: Never Used  Substance Use Topics  . Alcohol use: Yes  . Drug use: No    Home Medications Prior to Admission medications   Medication Sig Start Date End Date Taking? Authorizing Provider  acetaminophen (TYLENOL) 500 MG tablet Take 1 tablet (500 mg total) by mouth every 6 (six) hours as needed. 04/23/18   [name], [name], PA-C  amLODipine (NORVASC) 10 MG tablet Take 1 tablet (10 mg total) by mouth daily. 04/13/18   [name], DO  aspirin 81 MG tablet Take 1 tablet (81 mg total) by mouth daily. 09/24/14   [name], MD  atorvastatin (LIPITOR) 40 MG tablet Take 1 tablet (40 mg total) by mouth daily. 08/15/19   [name], DO  benztropine (COGENTIN) 1 MG tablet Take 1 tablet (1 mg total) by mouth 2 (two) times daily. 02/26/20   Salley Slaughter, NP  cetirizine (ZYRTEC ALLERGY) 10 MG tablet Take 1 tablet (10 mg total) by mouth daily. 02/01/20   [name], [name], PA-C  famotidine (PEPCID) 20 MG tablet Take 1 tablet (20 mg total) by mouth 2 (two) times daily. 05/02/19   [name], DO  fluPHENAZine decanoate (PROLIXIN) 25 MG/ML injection Inject 1 mL (25 mg total) into the muscle every 14 (fourteen) days. Last dose 09/07/12 02/26/20   Salley Slaughter, NP  fluticasone (FLONASE) 50 MCG/ACT nasal spray Place 2 sprays into both nostrils daily. 02/07/20   Nuala Alpha A, PA-C  hydrOXYzine (VISTARIL) 50 MG capsule Take 50 mg by mouth at bedtime. 02/01/20   [provider]  losartan (COZAAR) 100 MG tablet Take 1 tablet (100 mg total) by mouth daily. 11/28/19   Tammi Klippel, Sherin, DO  mometasone (NASONEX)  50 MCG/ACT nasal spray Place 2 sprays into the nose daily. 12/29/19   [name], PA-C  Multiple Vitamin (MULTIVITAMIN WITH MINERALS) TABS tablet Take 1 tablet by mouth daily. 06/28/14   [name], MD  sodium chloride (OCEAN) 0.65 % SOLN nasal spray Place 1 spray into both nostrils as needed for congestion. 11/19/19   [name], PA-C  tamsulosin (FLOMAX) 0.4 MG CAPS capsule TAKE 2 CAPSULE BY MOUTH DAILY Patient taking differently: Take 0.8 mg by mouth daily.  02/23/20   Mullis, Kiersten P, DO  traMADol (ULTRAM) 50 MG tablet Take 1 tablet (50 mg total) by mouth every 6 (six) hours as needed. 07/04/18   [name], MD  triamcinolone cream (KENALOG) 0.1 % Apply 1 application topically 2 (two) times daily. For 5 days, then twice daily as needed for rash and itching. 01/03/15   [name], MD  vitamin C (ASCORBIC ACID) 500 MG tablet Take 500 mg by mouth daily.    [provider]  loratadine (CLARITIN) 10 MG tablet Take 1 tablet (10 mg total) by mouth daily. 12/28/19 02/05/20  [name], DO    Allergies    Lisinopril, Penicillins, Amoxicillin, and Ibuprofen  Review of Systems   Review of Systems  Constitutional: Negative for activity change, appetite change, chills and fever.  HENT: Negative for congestion.   Respiratory: Negative for chest tightness and shortness of breath.   Cardiovascular: Negative for chest pain.  Gastrointestinal: Negative for abdominal pain, nausea and vomiting.  Genitourinary: Negative for dysuria and hematuria.  Musculoskeletal: Positive for arthralgias and myalgias.  Neurological: Negative for dizziness, weakness and headaches.   all other systems are negative except as noted in the HPI and PMH.    Physical Exam Updated Vital Signs BP (!) 163/118 (BP Location: Left Arm)   Pulse 69   Temp 98.6 F (37 C) (Oral)   Resp 18   SpO2 99%   Physical Exam Vitals and nursing note reviewed.  Constitutional:      General: He is not in  acute distress.    Appearance: He is well-developed.     Comments: Disheveled  HENT:     Head: Normocephalic and atraumatic.     Mouth/Throat:     Pharynx: No oropharyngeal exudate.  Eyes:     Conjunctiva/sclera: Conjunctivae normal.     Pupils: Pupils are equal, round, and reactive to light.  Neck:     Comments: No meningismus. Cardiovascular:     Rate and Rhythm: Normal rate and regular rhythm.     Heart sounds: Normal heart sounds. No murmur heard.   Pulmonary:     Effort: Pulmonary effort is normal. No  respiratory distress.     Breath sounds: Normal breath sounds.  Abdominal:     Palpations: Abdomen is soft.     Tenderness: There is no abdominal tenderness. There is no guarding or rebound.  Musculoskeletal:        General: Tenderness present. No swelling or signs of injury. Normal range of motion.     Cervical back: Normal range of motion and neck supple.     Comments: Tenderness palpation of proximal left medial knee joint line.  There is no effusion or warmth.  Flexion and extension are intact.  Able to lift leg and keep knee extended. Intact DP and PT pulses.  Skin:    General: Skin is warm.  Neurological:     Mental Status: He is alert and oriented to person, place, and time.     Cranial Nerves: No cranial nerve deficit.     Motor: No abnormal muscle tone.     Coordination: Coordination normal.     Comments: No ataxia on finger to nose bilaterally. No pronator drift. 5/5 strength throughout. CN 2-12 intact.Equal grip strength. Sensation intact.   Psychiatric:        Behavior: Behavior normal.     ED Results / Procedures / Treatments   Labs (all labs ordered are listed, but only abnormal results are displayed) Labs Reviewed  CBG MONITORING, ED    EKG None  Radiology No results found.  Procedures Procedures (including critical care time)  Medications Ordered in ED Medications  acetaminophen (TYLENOL) tablet 650 mg (has no administration in time range)      ED Course  I have reviewed the triage vital signs and the nursing notes.  Pertinent labs & imaging results that were available during my care of the patient were reviewed by me and considered in my medical decision making (see chart for details).    MDM Rules/Calculators/A&P                         Chronic knee pain with multiple evaluations for the same.  Neurovascularly intact.  Hypertensive in triage.  No evidence of septic joint.  No evidence of DVT.  Neurovascularly intact. X-rays have been negative previously.  No new trauma.  Patient given p.o. fluids as well as Tylenol.  He is able to ambulate.  He appears stable for discharge. Final Clinical Impression(s) / ED Diagnoses Final diagnoses:  Chronic pain of left knee    Rx / DC Orders ED Discharge Orders    None       Shelena Castelluccio, Annie Main, MD 03/04/20 0159

## 2020-03-03 NOTE — ED Triage Notes (Signed)
Pt needs medicine and something to drink he states.

## 2020-03-03 NOTE — ED Notes (Signed)
Patient already drinking tea

## 2020-03-03 NOTE — ED Triage Notes (Signed)
The pt is homeless hes here every night  For face pain sinus pains or other complaints just to stay in the waiting room

## 2020-03-03 NOTE — ED Notes (Signed)
Verbalized understanding of DC instructions

## 2020-03-03 NOTE — Discharge Instructions (Signed)
Use tylenol or ibuprofen as needed for pain. Followup with a primary doctor. Return to the ED if you develop new or worsening symptoms.

## 2020-03-04 ENCOUNTER — Emergency Department (HOSPITAL_COMMUNITY)
Admission: EM | Admit: 2020-03-04 | Discharge: 2020-03-05 | Disposition: A | Discharge status: Home / Self Care | Payer: Medicare Other | Attending: Emergency Medicine | Admitting: Emergency Medicine

## 2020-03-04 ENCOUNTER — Encounter (HOSPITAL_COMMUNITY): Payer: Self-pay | Admitting: *Deleted

## 2020-03-04 ENCOUNTER — Other Ambulatory Visit: Payer: Self-pay

## 2020-03-04 DIAGNOSIS — I959 Hypotension, unspecified: Secondary | ICD-10-CM | POA: Insufficient documentation

## 2020-03-04 DIAGNOSIS — F17210 Nicotine dependence, cigarettes, uncomplicated: Secondary | ICD-10-CM | POA: Insufficient documentation

## 2020-03-04 DIAGNOSIS — Z79899 Other long term (current) drug therapy: Secondary | ICD-10-CM | POA: Diagnosis not present

## 2020-03-04 DIAGNOSIS — I110 Hypertensive heart disease with heart failure: Secondary | ICD-10-CM | POA: Insufficient documentation

## 2020-03-04 DIAGNOSIS — I5030 Unspecified diastolic (congestive) heart failure: Secondary | ICD-10-CM | POA: Insufficient documentation

## 2020-03-04 DIAGNOSIS — M25562 Pain in left knee: Secondary | ICD-10-CM | POA: Insufficient documentation

## 2020-03-04 NOTE — ED Triage Notes (Signed)
Pt reports chronic left knee pain. No distress is noted at triage.

## 2020-03-05 ENCOUNTER — Emergency Department (HOSPITAL_COMMUNITY)
Admission: EM | Admit: 2020-03-05 | Discharge: 2020-03-06 | Disposition: A | Discharge status: Home / Self Care | Payer: Medicare Other | Source: Home / Self Care | Attending: Emergency Medicine | Admitting: Emergency Medicine

## 2020-03-05 ENCOUNTER — Other Ambulatory Visit: Payer: Self-pay

## 2020-03-05 DIAGNOSIS — G8929 Other chronic pain: Secondary | ICD-10-CM

## 2020-03-05 DIAGNOSIS — M25562 Pain in left knee: Secondary | ICD-10-CM | POA: Diagnosis not present

## 2020-03-05 MED ORDER — ACETAMINOPHEN 500 MG PO TABS
1000.0000 mg | ORAL_TABLET | Freq: Once | ORAL | Status: AC
  Administered 2020-03-05: 1000 mg via ORAL
  Filled 2020-03-05: qty 2

## 2020-03-05 NOTE — ED Triage Notes (Signed)
Pt presents to ED POv. Pt c/o L knee pain, ambulating well.

## 2020-03-05 NOTE — ED Provider Notes (Signed)
[place] EMERGENCY DEPARTMENT Provider Note   CSN: [id number] Arrival date & time: 03/04/20  1932     History Chief Complaint  Patient presents with  . Knee Pain    [name] is a 69 y.o. male.  Mont[place] is back again with knee pain. This is his chronic complaint. I watched him ambulate into the room without difficulty. Patient states his left knee. States that it is exactly the same no changes. No fevers. No new trauma. No other associated symptoms.   Knee Pain      Past Medical History:  Diagnosis Date  . BPH (benign prostatic hyperplasia)   . Colon polyp 2010  . Depression   . GERD (gastroesophageal reflux disease)   . Hepatitis C    "caught it when I had a blood transfusion"  . High cholesterol   . History of blood transfusion    "when I was young"  . Hypertension   . Paranoid schizophrenia ([place])   . Prostate atrophy   . Retinal vein occlusion     Patient Active Problem List   Diagnosis Date Noted  . GERD (gastroesophageal reflux disease) 05/03/2019  . Hypotension 08/02/2018  . Low back sprain, initial encounter 10/21/2017  . History of drug abuse ([place]) 10/18/2017  . Gingival erythema 09/23/2017  . Chronic pain of left knee 11/24/2016  . Tobacco abuse   . Diastolic heart failure ([place]) 02/05/2015  . Healthcare maintenance 05/28/2014  . Hyperlipidemia 03/10/2014  . Patellar tendonitis 11/16/2012  . [place] OCCLUSION 06/03/2010  . PARANOID SCHIZOPHRENIA, CHRONIC 01/17/2009  . HYPERTENSION, BENIGN ESSENTIAL 01/17/2009  . BPH (benign prostatic hyperplasia) 01/17/2009  . HEPATITIS C 12/14/2008  . DEPRESSION 12/14/2008    Past Surgical History:  Procedure Laterality Date  . CIRCUMCISION  1959  . TONSILLECTOMY         Family History  Problem Relation Age of Onset  . Hypertension Mother   . Diabetes Mother   . Stroke Mother     Social History   Tobacco Use  . Smoking status: Current Every Day Smoker     Packs/day: 0.50    Years: 48.00    Pack years: 24.00    Types: Cigarettes  . Smokeless tobacco: Never Used  Substance Use Topics  . Alcohol use: Yes  . Drug use: No    Home Medications Prior to Admission medications   Medication Sig Start Date End Date Taking? Authorizing Provider  acetaminophen (TYLENOL) 500 MG tablet Take 1 tablet (500 mg total) by mouth every 6 (six) hours as needed. 04/23/18   [name], [name], PA-C  amLODipine (NORVASC) 10 MG tablet Take 1 tablet (10 mg total) by mouth daily. 04/13/18   [name], DO  aspirin 81 MG tablet Take 1 tablet (81 mg total) by mouth daily. 09/24/14   [name], MD  atorvastatin (LIPITOR) 40 MG tablet Take 1 tablet (40 mg total) by mouth daily. 08/15/19   [name], DO  benztropine (COGENTIN) 1 MG tablet Take 1 tablet (1 mg total) by mouth 2 (two) times daily. 02/26/20   Salley Slaughter, NP  cetirizine (ZYRTEC ALLERGY) 10 MG tablet Take 1 tablet (10 mg total) by mouth daily. 02/01/20   [name], [name], PA-C  famotidine (PEPCID) 20 MG tablet Take 1 tablet (20 mg total) by mouth 2 (two) times daily. 05/02/19   [name], DO  fluPHENAZine decanoate (PROLIXIN) 25 MG/ML injection Inject 1 mL (25 mg total) into the  muscle every 14 (fourteen) days. Last dose 09/07/12 02/26/20   Salley Slaughter, NP  fluticasone (FLONASE) 50 MCG/ACT nasal spray Place 2 sprays into both nostrils daily. 02/07/20   Nuala Alpha A, PA-C  hydrOXYzine (VISTARIL) 50 MG capsule Take 50 mg by mouth at bedtime. 02/01/20   [provider]  losartan (COZAAR) 100 MG tablet Take 1 tablet (100 mg total) by mouth daily. 11/28/19   Tammi Klippel, Sherin, DO  mometasone (NASONEX) 50 MCG/ACT nasal spray Place 2 sprays into the nose daily. 12/29/19   [name], PA-C  Multiple Vitamin (MULTIVITAMIN WITH MINERALS) TABS tablet Take 1 tablet by mouth daily. 06/28/14   [name], MD  sodium chloride (OCEAN) 0.65 % SOLN nasal spray Place 1 spray into  both nostrils as needed for congestion. 11/19/19   [name], PA-C  tamsulosin (FLOMAX) 0.4 MG CAPS capsule TAKE 2 CAPSULE BY MOUTH DAILY Patient taking differently: Take 0.8 mg by mouth daily.  02/23/20   Mullis, Kiersten P, DO  traMADol (ULTRAM) 50 MG tablet Take 1 tablet (50 mg total) by mouth every 6 (six) hours as needed. 07/04/18   [name], MD  triamcinolone cream (KENALOG) 0.1 % Apply 1 application topically 2 (two) times daily. For 5 days, then twice daily as needed for rash and itching. 01/03/15   [name], MD  vitamin C (ASCORBIC ACID) 500 MG tablet Take 500 mg by mouth daily.    [provider]  loratadine (CLARITIN) 10 MG tablet Take 1 tablet (10 mg total) by mouth daily. 12/28/19 02/05/20  [name], DO    Allergies    Lisinopril, Penicillins, Amoxicillin, and Ibuprofen  Review of Systems   Review of Systems  All other systems reviewed and are negative.   Physical Exam Updated Vital Signs BP (!) 160/88   Pulse 66   Temp 98.6 F (37 C)   Resp 17   SpO2 98%   Physical Exam Vitals and nursing note reviewed.  Constitutional:      Appearance: He is well-developed.  HENT:     Head: Normocephalic and atraumatic.     Mouth/Throat:     Mouth: Mucous membranes are moist.     Pharynx: Oropharynx is clear.  Eyes:     Pupils: Pupils are equal, round, and reactive to light.  Cardiovascular:     Rate and Rhythm: Normal rate.  Pulmonary:     Effort: Pulmonary effort is normal. No respiratory distress.  Abdominal:     General: There is no distension.  Musculoskeletal:        General: No swelling or tenderness. Normal range of motion.     Cervical back: Normal range of motion.     Comments: Left knee examined thoroughly, anterior drawer is normal, no erythema, no edema, no pain with range of motion  Skin:    General: Skin is warm and dry.  Neurological:     General: No focal deficit present.     Mental Status: He is alert.     ED Results /  Procedures / Treatments   Labs (all labs ordered are listed, but only abnormal results are displayed) Labs Reviewed - No data to display  EKG None  Radiology No results found.  Procedures Procedures (including critical care time)  Medications Ordered in ED Medications  acetaminophen (TYLENOL) tablet 1,000 mg (1,000 mg Oral Given 03/05/20 0004)    ED Course  I have reviewed the triage vital signs and the nursing notes.  Pertinent labs & imaging results that were available during my care of the patient were reviewed by me and considered in my medical decision making (see chart for details).    MDM Rules/Calculators/A&P                          Low suspicion for new knee problem.   Final Clinical Impression(s) / ED Diagnoses Final diagnoses:  Acute pain of left knee    Rx / DC Orders ED Discharge Orders    None       Usbaldo Pannone, Corene Cornea, MD 03/05/20 [id number]

## 2020-03-06 NOTE — ED Provider Notes (Signed)
[facility] EMERGENCY DEPARTMENT Provider Note   CSN: [id number] Arrival date & time: 03/05/20  2017     History Chief Complaint  Patient presents with  . Knee Pain    [name] is a 69 y.o. male.  Patient returns with his usual left knee pain.  He has had innumerable visits for the same complaint.  States he "bumped it" yesterday.  Denies falling.  He has been taking naproxen and Tylenol without relief. No fevers, chills, nausea or vomiting.  Ambulatory without an issue.  The history is provided by the patient.  Knee Pain Associated symptoms: no back pain, no fever and no neck pain        Past Medical History:  Diagnosis Date  . BPH (benign prostatic hyperplasia)   . Colon polyp 2010  . Depression   . GERD (gastroesophageal reflux disease)   . Hepatitis C    "caught it when I had a blood transfusion"  . High cholesterol   . History of blood transfusion    "when I was young"  . Hypertension   . Paranoid schizophrenia ([place])   . Prostate atrophy   . Retinal vein occlusion     Patient Active Problem List   Diagnosis Date Noted  . GERD (gastroesophageal reflux disease) 05/03/2019  . Hypotension 08/02/2018  . Low back sprain, initial encounter 10/21/2017  . History of drug abuse ([place]) 10/18/2017  . Gingival erythema 09/23/2017  . Chronic pain of left knee 11/24/2016  . Tobacco abuse   . Diastolic heart failure ([place]) 02/05/2015  . Healthcare maintenance 05/28/2014  . Hyperlipidemia 03/10/2014  . Patellar tendonitis 11/16/2012  . [place] OCCLUSION 06/03/2010  . PARANOID SCHIZOPHRENIA, CHRONIC 01/17/2009  . HYPERTENSION, BENIGN ESSENTIAL 01/17/2009  . BPH (benign prostatic hyperplasia) 01/17/2009  . HEPATITIS C 12/14/2008  . DEPRESSION 12/14/2008    Past Surgical History:  Procedure Laterality Date  . CIRCUMCISION  1959  . TONSILLECTOMY         Family History  Problem Relation Age of Onset  . Hypertension Mother   .  Diabetes Mother   . Stroke Mother     Social History   Tobacco Use  . Smoking status: Current Every Day Smoker    Packs/day: 0.50    Years: 48.00    Pack years: 24.00    Types: Cigarettes  . Smokeless tobacco: Never Used  Substance Use Topics  . Alcohol use: Yes  . Drug use: No    Home Medications Prior to Admission medications   Medication Sig Start Date End Date Taking? Authorizing Provider  acetaminophen (TYLENOL) 500 MG tablet Take 1 tablet (500 mg total) by mouth every 6 (six) hours as needed. 04/23/18   [name], [name], PA-C  amLODipine (NORVASC) 10 MG tablet Take 1 tablet (10 mg total) by mouth daily. 04/13/18   [name], DO  aspirin 81 MG tablet Take 1 tablet (81 mg total) by mouth daily. 09/24/14   [name], MD  atorvastatin (LIPITOR) 40 MG tablet Take 1 tablet (40 mg total) by mouth daily. 08/15/19   [name], DO  benztropine (COGENTIN) 1 MG tablet Take 1 tablet (1 mg total) by mouth 2 (two) times daily. 02/26/20   Salley Slaughter, NP  cetirizine (ZYRTEC ALLERGY) 10 MG tablet Take 1 tablet (10 mg total) by mouth daily. 02/01/20   [name], [name], PA-C  famotidine (PEPCID) 20 MG tablet Take 1 tablet (20 mg total) by mouth 2 (  two) times daily. 05/02/19   [name], DO  fluPHENAZine decanoate (PROLIXIN) 25 MG/ML injection Inject 1 mL (25 mg total) into the muscle every 14 (fourteen) days. Last dose 09/07/12 02/26/20   Salley Slaughter, NP  fluticasone (FLONASE) 50 MCG/ACT nasal spray Place 2 sprays into both nostrils daily. 02/07/20   Nuala Alpha A, PA-C  hydrOXYzine (VISTARIL) 50 MG capsule Take 50 mg by mouth at bedtime. 02/01/20   [provider]  losartan (COZAAR) 100 MG tablet Take 1 tablet (100 mg total) by mouth daily. 11/28/19   Tammi Klippel, Sherin, DO  mometasone (NASONEX) 50 MCG/ACT nasal spray Place 2 sprays into the nose daily. 12/29/19   [name], PA-C  Multiple Vitamin (MULTIVITAMIN WITH MINERALS) TABS tablet Take 1 tablet by  mouth daily. 06/28/14   [name], MD  sodium chloride (OCEAN) 0.65 % SOLN nasal spray Place 1 spray into both nostrils as needed for congestion. 11/19/19   [name], PA-C  tamsulosin (FLOMAX) 0.4 MG CAPS capsule TAKE 2 CAPSULE BY MOUTH DAILY Patient taking differently: Take 0.8 mg by mouth daily.  02/23/20   Mullis, Kiersten P, DO  traMADol (ULTRAM) 50 MG tablet Take 1 tablet (50 mg total) by mouth every 6 (six) hours as needed. 07/04/18   [name], MD  triamcinolone cream (KENALOG) 0.1 % Apply 1 application topically 2 (two) times daily. For 5 days, then twice daily as needed for rash and itching. 01/03/15   [name], MD  vitamin C (ASCORBIC ACID) 500 MG tablet Take 500 mg by mouth daily.    [provider]  loratadine (CLARITIN) 10 MG tablet Take 1 tablet (10 mg total) by mouth daily. 12/28/19 02/05/20  [name], DO    Allergies    Lisinopril, Penicillins, Amoxicillin, and Ibuprofen  Review of Systems   Review of Systems  Constitutional: Negative for activity change, appetite change and fever.  HENT: Negative for congestion and rhinorrhea.   Respiratory: Negative for cough, chest tightness and shortness of breath.   Gastrointestinal: Negative for abdominal pain, nausea and vomiting.  Genitourinary: Negative for dysuria and hematuria.  Musculoskeletal: Positive for arthralgias and myalgias. Negative for back pain and neck pain.  Skin: Negative for wound.  Neurological: Negative for dizziness, weakness and headaches.   all other systems are negative except as noted in the HPI and PMH.   Physical Exam Updated Vital Signs BP (!) 134/97 (BP Location: Left Arm)   Pulse (!) 58   Temp 98 F (36.7 C) (Oral)   Resp 16   SpO2 98%   Physical Exam Vitals and nursing note reviewed.  Constitutional:      General: He is not in acute distress.    Appearance: He is well-developed.  HENT:     Head: Normocephalic and atraumatic.     Mouth/Throat:      Pharynx: No oropharyngeal exudate.  Eyes:     Conjunctiva/sclera: Conjunctivae normal.     Pupils: Pupils are equal, round, and reactive to light.  Neck:     Comments: No meningismus. Cardiovascular:     Rate and Rhythm: Normal rate and regular rhythm.     Heart sounds: Normal heart sounds. No murmur heard.   Pulmonary:     Effort: Pulmonary effort is normal. No respiratory distress.     Breath sounds: Normal breath sounds.  Abdominal:     Palpations: Abdomen is soft.     Tenderness: There is no abdominal tenderness. There is no guarding  or rebound.  Musculoskeletal:        General: Tenderness present. No swelling, deformity or signs of injury. Normal range of motion.     Cervical back: Normal range of motion and neck supple.     Comments: TTP anterior knee. No effusion or erythema. Flexion and extension intact. Able to raise leg and keep knee extended. Intact DP and PT pulses. Compartments soft.   Skin:    General: Skin is warm.  Neurological:     General: No focal deficit present.     Mental Status: He is alert and oriented to person, place, and time. Mental status is at baseline.     Cranial Nerves: No cranial nerve deficit.     Motor: No abnormal muscle tone.     Coordination: Coordination normal.     Comments: No ataxia on finger to nose bilaterally. No pronator drift. 5/5 strength throughout. CN 2-12 intact.Equal grip strength. Sensation intact.   Psychiatric:        Behavior: Behavior normal.     ED Results / Procedures / Treatments   Labs (all labs ordered are listed, but only abnormal results are displayed) Labs Reviewed - No data to display  EKG None  Radiology No results found.  Procedures Procedures (including critical care time)  Medications Ordered in ED Medications - No data to display  ED Course  I have reviewed the triage vital signs and the nursing notes.  Pertinent labs & imaging results that were available during my care of the patient were  reviewed by me and considered in my medical decision making (see chart for details).    MDM Rules/Calculators/A&P                          Chronic knee pain. Able to ambulate. No recent fall. Doubt fracture.  No evidence of septic joint or DVT.  Patient is able to ambulate without difficulty.  No evidence of acute process today.  He is stable for follow-up with his PCP. Final Clinical Impression(s) / ED Diagnoses Final diagnoses:  Chronic pain of left knee    Rx / DC Orders ED Discharge Orders    None       Crimson Dubberly, Annie Main, MD 03/06/20 [phone]

## 2020-03-06 NOTE — Discharge Instructions (Addendum)
Use tylenol or motrin as needed for pain.  Follow-up with your doctor. Return to the ED with new or worsening symptoms

## 2020-03-07 ENCOUNTER — Encounter (HOSPITAL_COMMUNITY): Payer: Self-pay | Admitting: Emergency Medicine

## 2020-03-07 ENCOUNTER — Emergency Department (HOSPITAL_COMMUNITY)
Admission: EM | Admit: 2020-03-07 | Discharge: 2020-03-07 | Disposition: A | Discharge status: Home / Self Care | Payer: Medicare Other | Attending: Emergency Medicine | Admitting: Emergency Medicine

## 2020-03-07 ENCOUNTER — Emergency Department (HOSPITAL_COMMUNITY)
Admission: EM | Admit: 2020-03-07 | Discharge: 2020-03-08 | Disposition: A | Discharge status: Home / Self Care | Payer: Medicare Other | Source: Home / Self Care | Attending: Emergency Medicine | Admitting: Emergency Medicine

## 2020-03-07 ENCOUNTER — Other Ambulatory Visit: Payer: Self-pay

## 2020-03-07 DIAGNOSIS — I5030 Unspecified diastolic (congestive) heart failure: Secondary | ICD-10-CM | POA: Insufficient documentation

## 2020-03-07 DIAGNOSIS — G8929 Other chronic pain: Secondary | ICD-10-CM

## 2020-03-07 DIAGNOSIS — Z5321 Procedure and treatment not carried out due to patient leaving prior to being seen by health care provider: Secondary | ICD-10-CM | POA: Insufficient documentation

## 2020-03-07 DIAGNOSIS — Z79899 Other long term (current) drug therapy: Secondary | ICD-10-CM | POA: Insufficient documentation

## 2020-03-07 DIAGNOSIS — M25562 Pain in left knee: Secondary | ICD-10-CM

## 2020-03-07 DIAGNOSIS — I110 Hypertensive heart disease with heart failure: Secondary | ICD-10-CM | POA: Insufficient documentation

## 2020-03-07 DIAGNOSIS — Z7982 Long term (current) use of aspirin: Secondary | ICD-10-CM | POA: Insufficient documentation

## 2020-03-07 DIAGNOSIS — F17210 Nicotine dependence, cigarettes, uncomplicated: Secondary | ICD-10-CM | POA: Insufficient documentation

## 2020-03-07 NOTE — ED Notes (Signed)
Called x3 in lobby. NO response.

## 2020-03-07 NOTE — ED Triage Notes (Signed)
[name] reports his left knee is a 10/10 pain.  No other changes reported.

## 2020-03-07 NOTE — ED Triage Notes (Signed)
Patient reports left knee pain today , ambulatory , denies injury or fall.

## 2020-03-08 ENCOUNTER — Emergency Department (HOSPITAL_COMMUNITY)
Admission: EM | Admit: 2020-03-08 | Discharge: 2020-03-09 | Disposition: A | Discharge status: Home / Self Care | Payer: Medicare Other | Attending: Emergency Medicine | Admitting: Emergency Medicine

## 2020-03-08 ENCOUNTER — Encounter (HOSPITAL_COMMUNITY): Payer: Self-pay | Admitting: Emergency Medicine

## 2020-03-08 ENCOUNTER — Other Ambulatory Visit: Payer: Self-pay

## 2020-03-08 DIAGNOSIS — M25562 Pain in left knee: Secondary | ICD-10-CM | POA: Diagnosis not present

## 2020-03-08 DIAGNOSIS — F17210 Nicotine dependence, cigarettes, uncomplicated: Secondary | ICD-10-CM | POA: Diagnosis not present

## 2020-03-08 DIAGNOSIS — I110 Hypertensive heart disease with heart failure: Secondary | ICD-10-CM | POA: Diagnosis not present

## 2020-03-08 DIAGNOSIS — W501XXA Accidental kick by another person, initial encounter: Secondary | ICD-10-CM | POA: Insufficient documentation

## 2020-03-08 DIAGNOSIS — Z79899 Other long term (current) drug therapy: Secondary | ICD-10-CM | POA: Diagnosis not present

## 2020-03-08 DIAGNOSIS — I5032 Chronic diastolic (congestive) heart failure: Secondary | ICD-10-CM | POA: Diagnosis not present

## 2020-03-08 MED ORDER — ACETAMINOPHEN 500 MG PO TABS
1000.0000 mg | ORAL_TABLET | Freq: Once | ORAL | Status: AC
  Administered 2020-03-08: 1000 mg via ORAL
  Filled 2020-03-08: qty 2

## 2020-03-08 NOTE — Discharge Instructions (Signed)
Rest and elevate the knee when it is is hurting.  Take tylenol every 6 hours for pain

## 2020-03-08 NOTE — ED Provider Notes (Signed)
[facility] EMERGENCY DEPARTMENT Provider Note   CSN: [id number] Arrival date & time: 03/07/20  2329     History Chief Complaint  Patient presents with  . Leg Pain    [name] is a 69 y.o. male.  Patient is a 69 year old male with a history of paranoid schizophrenia, hypertension, hepatitis C, GERD and chronic left knee pain who is seen frequently in the emergency room multiple times a week for left knee pain who is presenting today with persistent pain in his left knee. Patient reports that he has not been able to get any Tylenol for his knee and it is hurting today. He reports the knee hurts all over but denies any injury. Patient last had imaging of the knee in April for similar complaints which was normal. He has no other complaints at this time.  The history is provided by the patient.  Leg Pain Location:  Knee Injury: no   Knee location:  L knee Pain details:    Quality:  Aching, cramping and throbbing   Radiates to:  Does not radiate   Severity:  Severe   Onset quality:  Unable to specify   Timing:  Constant   Progression:  Unchanged Chronicity:  Chronic Prior injury to area:  No Relieved by:  Acetaminophen Worsened by:  Bearing weight Ineffective treatments:  None tried Associated symptoms: no fever and no swelling        Past Medical History:  Diagnosis Date  . BPH (benign prostatic hyperplasia)   . Colon polyp 2010  . Depression   . GERD (gastroesophageal reflux disease)   . Hepatitis C    "caught it when I had a blood transfusion"  . High cholesterol   . History of blood transfusion    "when I was young"  . Hypertension   . Paranoid schizophrenia ([place])   . Prostate atrophy   . Retinal vein occlusion     Patient Active Problem List   Diagnosis Date Noted  . GERD (gastroesophageal reflux disease) 05/03/2019  . Hypotension 08/02/2018  . Low back sprain, initial encounter 10/21/2017  . History of drug abuse ([place]) 10/18/2017    . Gingival erythema 09/23/2017  . Chronic pain of left knee 11/24/2016  . Tobacco abuse   . Diastolic heart failure ([place]) 02/05/2015  . Healthcare maintenance 05/28/2014  . Hyperlipidemia 03/10/2014  . Patellar tendonitis 11/16/2012  . [place] OCCLUSION 06/03/2010  . PARANOID SCHIZOPHRENIA, CHRONIC 01/17/2009  . HYPERTENSION, BENIGN ESSENTIAL 01/17/2009  . BPH (benign prostatic hyperplasia) 01/17/2009  . HEPATITIS C 12/14/2008  . DEPRESSION 12/14/2008    Past Surgical History:  Procedure Laterality Date  . CIRCUMCISION  1959  . TONSILLECTOMY         Family History  Problem Relation Age of Onset  . Hypertension Mother   . Diabetes Mother   . Stroke Mother     Social History   Tobacco Use  . Smoking status: Current Every Day Smoker    Packs/day: 0.50    Years: 48.00    Pack years: 24.00    Types: Cigarettes  . Smokeless tobacco: Never Used  Substance Use Topics  . Alcohol use: Yes  . Drug use: No    Home Medications Prior to Admission medications   Medication Sig Start Date End Date Taking? Authorizing Provider  acetaminophen (TYLENOL) 500 MG tablet Take 1 tablet (500 mg total) by mouth every 6 (six) hours as needed. 04/23/18   Renita Papa,  PA-C  amLODipine (NORVASC) 10 MG tablet Take 1 tablet (10 mg total) by mouth daily. 04/13/18   [name], DO  aspirin 81 MG tablet Take 1 tablet (81 mg total) by mouth daily. 09/24/14   [name], MD  atorvastatin (LIPITOR) 40 MG tablet Take 1 tablet (40 mg total) by mouth daily. 08/15/19   [name], DO  benztropine (COGENTIN) 1 MG tablet Take 1 tablet (1 mg total) by mouth 2 (two) times daily. 02/26/20   Salley Slaughter, NP  cetirizine (ZYRTEC ALLERGY) 10 MG tablet Take 1 tablet (10 mg total) by mouth daily. 02/01/20   [name], [name], PA-C  famotidine (PEPCID) 20 MG tablet Take 1 tablet (20 mg total) by mouth 2 (two) times daily. 05/02/19   [name], DO  fluPHENAZine decanoate  (PROLIXIN) 25 MG/ML injection Inject 1 mL (25 mg total) into the muscle every 14 (fourteen) days. Last dose 09/07/12 02/26/20   Salley Slaughter, NP  fluticasone (FLONASE) 50 MCG/ACT nasal spray Place 2 sprays into both nostrils daily. 02/07/20   Nuala Alpha A, PA-C  hydrOXYzine (VISTARIL) 50 MG capsule Take 50 mg by mouth at bedtime. 02/01/20   [provider]  losartan (COZAAR) 100 MG tablet Take 1 tablet (100 mg total) by mouth daily. 11/28/19   Tammi Klippel, Sherin, DO  mometasone (NASONEX) 50 MCG/ACT nasal spray Place 2 sprays into the nose daily. 12/29/19   [name], PA-C  Multiple Vitamin (MULTIVITAMIN WITH MINERALS) TABS tablet Take 1 tablet by mouth daily. 06/28/14   [name], MD  sodium chloride (OCEAN) 0.65 % SOLN nasal spray Place 1 spray into both nostrils as needed for congestion. 11/19/19   [name], PA-C  tamsulosin (FLOMAX) 0.4 MG CAPS capsule TAKE 2 CAPSULE BY MOUTH DAILY Patient taking differently: Take 0.8 mg by mouth daily.  02/23/20   Mullis, Kiersten P, DO  traMADol (ULTRAM) 50 MG tablet Take 1 tablet (50 mg total) by mouth every 6 (six) hours as needed. 07/04/18   [name], MD  triamcinolone cream (KENALOG) 0.1 % Apply 1 application topically 2 (two) times daily. For 5 days, then twice daily as needed for rash and itching. 01/03/15   [name], MD  vitamin C (ASCORBIC ACID) 500 MG tablet Take 500 mg by mouth daily.    [provider]  loratadine (CLARITIN) 10 MG tablet Take 1 tablet (10 mg total) by mouth daily. 12/28/19 02/05/20  [name], DO    Allergies    Lisinopril, Penicillins, Amoxicillin, and Ibuprofen  Review of Systems   Review of Systems  Constitutional: Negative for fever.  All other systems reviewed and are negative.   Physical Exam Updated Vital Signs BP (!) 177/107 (BP Location: Left Arm)   Pulse 78   Temp 98.6 F (37 C) (Oral)   Resp 18   SpO2 100%   Physical Exam Vitals and nursing note  reviewed.  Constitutional:      General: He is not in acute distress.    Appearance: Normal appearance. He is normal weight.     Comments: Calm and cooperative  HENT:     Head: Normocephalic.     Mouth/Throat:     Comments: Poor dentition Eyes:     Pupils: Pupils are equal, round, and reactive to light.  Cardiovascular:     Rate and Rhythm: Normal rate.     Pulses: Normal pulses.  Pulmonary:     Effort: Pulmonary effort is normal.  Musculoskeletal:  Right lower leg: No edema.     Left lower leg: No edema.     Comments: Mild tenderness with palpation throughout the left knee medially and laterally. No effusion, erythema or warmth noted. No calf tenderness or lower extremity swelling.  Neurological:     General: No focal deficit present.     Mental Status: He is alert. Mental status is at baseline.  Psychiatric:     Comments: Agreeable, calm and cooperative     ED Results / Procedures / Treatments   Labs (all labs ordered are listed, but only abnormal results are displayed) Labs Reviewed - No data to display  EKG None  Radiology No results found.  Procedures Procedures (including critical care time)  Medications Ordered in ED Medications  acetaminophen (TYLENOL) tablet 1,000 mg (has no administration in time range)    ED Course  I have reviewed the triage vital signs and the nursing notes.  Pertinent labs & imaging results that were available during my care of the patient were reviewed by me and considered in my medical decision making (see chart for details).    MDM Rules/Calculators/A&P                          Patient presenting with chronic left knee pain. Patient has been seen 1-2 times a day for the last 3 to 4 days for the same complaint. Patient has had negative imaging of this knee and seems to be a frequent complaint when he comes to the emergency room. There is no evidence of septic joint, DVT or other acute knee pathology. Patient given Tylenol and  discharged.  Final Clinical Impression(s) / ED Diagnoses Final diagnoses:  Chronic pain of left knee    Rx / DC Orders ED Discharge Orders    None       Blanchie Dessert, MD 03/08/20 [phone]

## 2020-03-08 NOTE — ED Triage Notes (Signed)
[name] reports he was playing w/ his grandson and got kicked in the left shin.  Pt ambulatory and at baseline.

## 2020-03-09 ENCOUNTER — Emergency Department (HOSPITAL_COMMUNITY)
Admission: EM | Admit: 2020-03-09 | Discharge: 2020-03-10 | Disposition: A | Discharge status: Home / Self Care | Payer: Medicare Other | Source: Home / Self Care | Attending: Emergency Medicine | Admitting: Emergency Medicine

## 2020-03-09 ENCOUNTER — Encounter (HOSPITAL_COMMUNITY): Payer: Self-pay | Admitting: Emergency Medicine

## 2020-03-09 DIAGNOSIS — R030 Elevated blood-pressure reading, without diagnosis of hypertension: Secondary | ICD-10-CM

## 2020-03-09 DIAGNOSIS — M25562 Pain in left knee: Secondary | ICD-10-CM

## 2020-03-09 DIAGNOSIS — G8929 Other chronic pain: Secondary | ICD-10-CM

## 2020-03-09 NOTE — Discharge Instructions (Addendum)
At this time there does not appear to be the presence of an emergent medical condition, however there is always the potential for conditions to change. Please read and follow the below instructions.  Please return to the Emergency Department immediately for any new or worsening symptoms. Please be sure to follow up with your Primary Care Provider within one week regarding your visit today; please call their office to schedule an appointment even if you are feeling better for a follow-up visit. You may call the orthopedic specialist Dr. Marcelino Scot on your discharge paperwork for follow-up.  Get help right away if: Your knee swells, and the swelling gets worse. You cannot move your knee. You have fever or chills. You have very bad knee pain. You have any new/concerning or worsening of symptoms  Please read the additional information packets attached to your discharge summary.  Do not take your medicine if  develop an itchy rash, swelling in your mouth or lips, or difficulty breathing; call 911 and seek immediate emergency medical attention if this occurs.  You may review your lab tests and imaging results in their entirety on your MyChart account.  Please discuss all results of fully with your primary care provider and other specialist at your follow-up visit.  Note: Portions of this text may have been transcribed using voice recognition software. Every effort was made to ensure accuracy; however, inadvertent computerized transcription errors may still be present.

## 2020-03-09 NOTE — ED Provider Notes (Signed)
[facility] EMERGENCY DEPARTMENT Provider Note   CSN: [id number] Arrival date & time: 03/08/20  2213     History Chief Complaint  Patient presents with  . Leg Pain    Corey SHOULTZ is a 69 y.o. male history includes schizophrenia, GERD, hepatitis, hypertension, chronic knee pain.  Patient presents today for left knee pain, he reports he was playing with his grandson yesterday afternoon when he was kicked in the left shin.  He reports a mild throbbing pain of the area nonradiating worsened with palpation improved with rest.  He denies difficulty ambulating or any additional concerns today.  He has been waiting in the emergency department greater than 10 hours prior to my initial evaluation reports his pain is nearly resolved.  Denies fall, head injury, chest pain, abdominal pain, numbness and tingling, weakness, bruising/skin break, swelling or any additional concerns.  HPI     Past Medical History:  Diagnosis Date  . BPH (benign prostatic hyperplasia)   . Colon polyp 2010  . Depression   . GERD (gastroesophageal reflux disease)   . Hepatitis C    "caught it when I had a blood transfusion"  . High cholesterol   . History of blood transfusion    "when I was young"  . Hypertension   . Paranoid schizophrenia ([place])   . Prostate atrophy   . Retinal vein occlusion     Patient Active Problem List   Diagnosis Date Noted  . GERD (gastroesophageal reflux disease) 05/03/2019  . Hypotension 08/02/2018  . Low back sprain, initial encounter 10/21/2017  . History of drug abuse ([place]) 10/18/2017  . Gingival erythema 09/23/2017  . Chronic pain of left knee 11/24/2016  . Tobacco abuse   . Diastolic heart failure ([place]) 02/05/2015  . Healthcare maintenance 05/28/2014  . Hyperlipidemia 03/10/2014  . Patellar tendonitis 11/16/2012  . [place] OCCLUSION 06/03/2010  . PARANOID SCHIZOPHRENIA, CHRONIC 01/17/2009  . HYPERTENSION, BENIGN ESSENTIAL 01/17/2009  .  BPH (benign prostatic hyperplasia) 01/17/2009  . HEPATITIS C 12/14/2008  . DEPRESSION 12/14/2008    Past Surgical History:  Procedure Laterality Date  . CIRCUMCISION  1959  . TONSILLECTOMY         Family History  Problem Relation Age of Onset  . Hypertension Mother   . Diabetes Mother   . Stroke Mother     Social History   Tobacco Use  . Smoking status: Current Every Day Smoker    Packs/day: 0.50    Years: 48.00    Pack years: 24.00    Types: Cigarettes  . Smokeless tobacco: Never Used  Substance Use Topics  . Alcohol use: Yes  . Drug use: No    Home Medications Prior to Admission medications   Medication Sig Start Date End Date Taking? Authorizing Provider  acetaminophen (TYLENOL) 500 MG tablet Take 1 tablet (500 mg total) by mouth every 6 (six) hours as needed. 04/23/18   [name], [name], PA-C  amLODipine (NORVASC) 10 MG tablet Take 1 tablet (10 mg total) by mouth daily. 04/13/18   [name], DO  aspirin 81 MG tablet Take 1 tablet (81 mg total) by mouth daily. 09/24/14   [name], MD  atorvastatin (LIPITOR) 40 MG tablet Take 1 tablet (40 mg total) by mouth daily. 08/15/19   [name], DO  benztropine (COGENTIN) 1 MG tablet Take 1 tablet (1 mg total) by mouth 2 (two) times daily. 02/26/20   Salley Slaughter, NP  cetirizine (ZYRTEC ALLERGY)  10 MG tablet Take 1 tablet (10 mg total) by mouth daily. 02/01/20   [name], [name], PA-C  famotidine (PEPCID) 20 MG tablet Take 1 tablet (20 mg total) by mouth 2 (two) times daily. 05/02/19   [name], DO  fluPHENAZine decanoate (PROLIXIN) 25 MG/ML injection Inject 1 mL (25 mg total) into the muscle every 14 (fourteen) days. Last dose 09/07/12 02/26/20   Salley Slaughter, NP  fluticasone (FLONASE) 50 MCG/ACT nasal spray Place 2 sprays into both nostrils daily. 02/07/20   Nuala Alpha A, PA-C  hydrOXYzine (VISTARIL) 50 MG capsule Take 50 mg by mouth at bedtime. 02/01/20   [provider]  losartan  (COZAAR) 100 MG tablet Take 1 tablet (100 mg total) by mouth daily. 11/28/19   Tammi Klippel, Sherin, DO  mometasone (NASONEX) 50 MCG/ACT nasal spray Place 2 sprays into the nose daily. 12/29/19   [name], PA-C  Multiple Vitamin (MULTIVITAMIN WITH MINERALS) TABS tablet Take 1 tablet by mouth daily. 06/28/14   [name], MD  sodium chloride (OCEAN) 0.65 % SOLN nasal spray Place 1 spray into both nostrils as needed for congestion. 11/19/19   [name], PA-C  tamsulosin (FLOMAX) 0.4 MG CAPS capsule TAKE 2 CAPSULE BY MOUTH DAILY Patient taking differently: Take 0.8 mg by mouth daily.  02/23/20   Mullis, Kiersten P, DO  traMADol (ULTRAM) 50 MG tablet Take 1 tablet (50 mg total) by mouth every 6 (six) hours as needed. 07/04/18   [name], MD  triamcinolone cream (KENALOG) 0.1 % Apply 1 application topically 2 (two) times daily. For 5 days, then twice daily as needed for rash and itching. 01/03/15   [name], MD  vitamin C (ASCORBIC ACID) 500 MG tablet Take 500 mg by mouth daily.    [provider]  loratadine (CLARITIN) 10 MG tablet Take 1 tablet (10 mg total) by mouth daily. 12/28/19 02/05/20  [name], DO    Allergies    Lisinopril, Penicillins, Amoxicillin, and Ibuprofen  Review of Systems   Review of Systems  Constitutional: Negative.  Negative for chills and fever.  Cardiovascular: Negative.  Negative for chest pain and leg swelling.  Gastrointestinal: Negative.  Negative for abdominal pain.  Musculoskeletal: Positive for arthralgias. Negative for back pain, myalgias and neck pain.  Skin: Negative.  Negative for color change and wound.  Neurological: Negative.  Negative for weakness, numbness and headaches.    Physical Exam Updated Vital Signs BP (!) 168/77 (BP Location: Right Arm)   Pulse 61   Temp 98.5 F (36.9 C) (Oral)   Resp 18   SpO2 100%   Physical Exam Constitutional:      General: He is not in acute distress.    Appearance: Normal  appearance. He is well-developed. He is not ill-appearing or diaphoretic.  HENT:     Head: Normocephalic and atraumatic.  Eyes:     General: Vision grossly intact. Gaze aligned appropriately.     Pupils: Pupils are equal, round, and reactive to light.  Neck:     Trachea: Trachea and phonation normal.  Cardiovascular:     Rate and Rhythm: Normal rate and regular rhythm.     Pulses:          Dorsalis pedis pulses are 2+ on the right side and 2+ on the left side.  Pulmonary:     Effort: Pulmonary effort is normal. No respiratory distress.  Abdominal:     General: There is no distension.  Palpations: Abdomen is soft.     Tenderness: There is no abdominal tenderness. There is no guarding or rebound.  Musculoskeletal:        General: Normal range of motion.     Cervical back: Normal range of motion.       Legs:     Comments: Patient points to his left tibial tuberosity when asked where his pain is.  If there is no evidence of injury normal-appearing leg.  No significant difference compared to right leg.  Full range of motion with flexion extension of the left knee without pain.  Patient is able bring his left knee to his chest without difficulty.  Good range of motion at the ankle and foot without pain or difficulty.  Strong equal pedal pulses.  Compartments soft.  Good capillary refill and sensation to all toes.  He is ambulatory around the emergency department without assistance or difficulty.  Feet:     Right foot:     Protective Sensation: 5 sites tested. 5 sites sensed.     Left foot:     Protective Sensation: 5 sites tested. 5 sites sensed.  Skin:    General: Skin is warm and dry.  Neurological:     Mental Status: He is alert.     GCS: GCS eye subscore is 4. GCS verbal subscore is 5. GCS motor subscore is 6.     Comments: Major Cranial nerves without deficit, no facial droop Normal strength in upper and lower extremities bilaterally including dorsiflexion and plantar flexion,  strong and equal grip strength Sensation normal to light and sharp touch Moves extremities without ataxia, coordination intact Steady gait  Psychiatric:        Behavior: Behavior normal.     ED Results / Procedures / Treatments   Labs (all labs ordered are listed, but only abnormal results are displayed) Labs Reviewed - No data to display  EKG None  Radiology No results found.  Procedures Procedures (including critical care time)  Medications Ordered in ED Medications - No data to display  ED Course  I have reviewed the triage vital signs and the nursing notes.  Pertinent labs & imaging results that were available during my care of the patient were reviewed by me and considered in my medical decision making (see chart for details).    MDM Rules/Calculators/A&P                          Additional history obtained from: 1. Nursing notes from this visit. 2. EMR reviewed, patient with multiple visits this week for left knee pain. ------------- 69 year old male presents for left knee pain, he reports that his grandson had kicked his knee last night.  There is no evidence of injury on examination.  He has good range of motion and appropriate strength.  Neurovascular intact distally.  He is ambulatory on the emergency department.  Do not feel imaging is indicated at this time.  Low suspicion for fracture/dislocation other acute injury at this time.  No evidence of DVT, septic arthritis, cellulitis, compartment syndrome or any other emergent pathologies.  He is requesting a [place] sandwich and discharge.  Will refer to Ortho for follow-up. - 8:58 AM: Patient has been instructed sandwich, is now fully dressed standing in the doorway requesting discharge.  Referral given to Dr. Marcelino Scot for follow-up, return precautions discussed.  At this time there does not appear to be any evidence of an acute emergency medical condition  and the patient appears stable for discharge with appropriate  outpatient follow up. Diagnosis was discussed with patient who verbalizes understanding of care plan and is agreeable to discharge. I have discussed return precautions with patient who verbalizes understanding. Patient encouraged to follow-up with their PCP and ortho. All questions answered.  Discussed case with Dr. Nicholes Stairs during this visit.  Note: Portions of this report may have been transcribed using voice recognition software. Every effort was made to ensure accuracy; however, inadvertent computerized transcription errors may still be present. Final Clinical Impression(s) / ED Diagnoses Final diagnoses:  Left knee pain, unspecified chronicity    Rx / DC Orders ED Discharge Orders    None       Gari Crown 03/09/20 0902    Little, Wenda Overland, MD 03/09/20 [phone]

## 2020-03-09 NOTE — ED Notes (Signed)
BP at time of dc 182/102- Pt states it is always high in the morning before he takes BP medication. PT instructed to take medication once he arrives home and has access to it and pt agrees AVS reviewed with pt. Pt ambulatory out of dept.

## 2020-03-09 NOTE — ED Triage Notes (Signed)
Pt to ED with c/o playing with grandson and hurt his left knee.  Pt was seen last night for same

## 2020-03-10 ENCOUNTER — Other Ambulatory Visit: Payer: Self-pay

## 2020-03-10 ENCOUNTER — Encounter (HOSPITAL_COMMUNITY): Payer: Self-pay

## 2020-03-10 ENCOUNTER — Emergency Department (HOSPITAL_COMMUNITY)
Admission: EM | Admit: 2020-03-10 | Discharge: 2020-03-11 | Disposition: A | Discharge status: Home / Self Care | Payer: Medicare Other | Attending: Emergency Medicine | Admitting: Emergency Medicine

## 2020-03-10 DIAGNOSIS — Z880 Allergy status to penicillin: Secondary | ICD-10-CM | POA: Insufficient documentation

## 2020-03-10 DIAGNOSIS — M25569 Pain in unspecified knee: Secondary | ICD-10-CM

## 2020-03-10 DIAGNOSIS — F17210 Nicotine dependence, cigarettes, uncomplicated: Secondary | ICD-10-CM | POA: Insufficient documentation

## 2020-03-10 DIAGNOSIS — M25562 Pain in left knee: Secondary | ICD-10-CM | POA: Diagnosis not present

## 2020-03-10 DIAGNOSIS — Z79899 Other long term (current) drug therapy: Secondary | ICD-10-CM | POA: Diagnosis not present

## 2020-03-10 DIAGNOSIS — Z7982 Long term (current) use of aspirin: Secondary | ICD-10-CM | POA: Diagnosis not present

## 2020-03-10 DIAGNOSIS — I10 Essential (primary) hypertension: Secondary | ICD-10-CM | POA: Diagnosis not present

## 2020-03-10 MED ORDER — AMLODIPINE BESYLATE 5 MG PO TABS
10.0000 mg | ORAL_TABLET | Freq: Once | ORAL | Status: AC
  Administered 2020-03-10: 10 mg via ORAL
  Filled 2020-03-10: qty 2

## 2020-03-10 NOTE — ED Triage Notes (Signed)
Pt arrives POV for eval of L knee pain. Here for same last 3 days.

## 2020-03-10 NOTE — Discharge Instructions (Addendum)
At this time there does not appear to be the presence of an emergent medical condition, however there is always the potential for conditions to change. Please read and follow the below instructions.  Please return to the Emergency Department immediately for any new or worsening symptoms. Please be sure to follow up with your Primary Care Provider within one week regarding your visit today; please call their office to schedule an appointment even if you are feeling better for a follow-up visit. Your blood pressure was elevated today in the emergency department.  Take your blood pressure medication as directed by your primary care doctor and have it rechecked in the next week.  Get help right away if: Your knee swells, and the swelling gets worse. You cannot move your knee. You have very bad knee pain. You have fever or chills Get a very bad headache. Start to feel mixed up (confused). Feel weak or numb. Feel faint. Have very bad pain in your: Chest. Belly (abdomen). Throw up more than once. Have trouble breathing. You have any new/concerning or worsening of symptoms  Please read the additional information packets attached to your discharge summary.  Do not take your medicine if  develop an itchy rash, swelling in your mouth or lips, or difficulty breathing; call 911 and seek immediate emergency medical attention if this occurs.  You may review your lab tests and imaging results in their entirety on your MyChart account.  Please discuss all results of fully with your primary care provider and other specialist at your follow-up visit.  Note: Portions of this text may have been transcribed using voice recognition software. Every effort was made to ensure accuracy; however, inadvertent computerized transcription errors may still be present.

## 2020-03-10 NOTE — ED Notes (Signed)
Patient verbalizes understanding of discharge instructions. Opportunity for questioning and answers were provided. Armband removed by staff, pt discharged from ED.  
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
## 2020-03-11 ENCOUNTER — Ambulatory Visit (HOSPITAL_COMMUNITY): Payer: Medicare Other | Admitting: *Deleted

## 2020-03-11 ENCOUNTER — Encounter (HOSPITAL_COMMUNITY): Payer: Self-pay

## 2020-03-11 ENCOUNTER — Encounter (HOSPITAL_COMMUNITY): Payer: Self-pay | Admitting: Emergency Medicine

## 2020-03-11 ENCOUNTER — Other Ambulatory Visit: Payer: Self-pay

## 2020-03-11 ENCOUNTER — Emergency Department (HOSPITAL_COMMUNITY)
Admission: EM | Admit: 2020-03-11 | Discharge: 2020-03-11 | Disposition: A | Discharge status: Home / Self Care | Payer: Medicare Other | Source: Home / Self Care | Attending: Emergency Medicine | Admitting: Emergency Medicine

## 2020-03-11 ENCOUNTER — Telehealth (HOSPITAL_COMMUNITY): Payer: Self-pay | Admitting: Psychiatry

## 2020-03-11 VITALS — BP 149/92 | HR 77 | Wt 148.0 lb

## 2020-03-11 DIAGNOSIS — Z79899 Other long term (current) drug therapy: Secondary | ICD-10-CM | POA: Insufficient documentation

## 2020-03-11 DIAGNOSIS — R519 Headache, unspecified: Secondary | ICD-10-CM | POA: Insufficient documentation

## 2020-03-11 DIAGNOSIS — F200 Paranoid schizophrenia: Secondary | ICD-10-CM

## 2020-03-11 DIAGNOSIS — F17210 Nicotine dependence, cigarettes, uncomplicated: Secondary | ICD-10-CM | POA: Insufficient documentation

## 2020-03-11 DIAGNOSIS — M25562 Pain in left knee: Secondary | ICD-10-CM | POA: Diagnosis not present

## 2020-03-11 DIAGNOSIS — I10 Essential (primary) hypertension: Secondary | ICD-10-CM | POA: Insufficient documentation

## 2020-03-11 MED ORDER — FLUPHENAZINE DECANOATE 25 MG/ML IJ SOLN
25.0000 mg | INTRAMUSCULAR | Status: DC
  Administered 2020-03-11 – 2020-04-08 (×3): 25 mg via INTRAMUSCULAR

## 2020-03-11 NOTE — Progress Notes (Signed)
In to facility today to hang out in lobby and has two cups of coffee with him. He did not remember today was his injection day but more than willing to get it while here and staff reminding him. He states he spent last night at the hospital because he is currently homeless and is wearing a hospital arm band. He states he gets a check tomorrow and will use it to buy food. Denies having any thing to eat this am. He was given two packs of snack cookies this am by staff. He is pleasant and cooperative, and denies any psychotic sx. Injection of Prolixin D, 25mg  given as ordered in L deltoid this am and tolerated well.

## 2020-03-11 NOTE — Telephone Encounter (Signed)
Pt presented as a walk-in to receive his bi-weekly IM Prolixin decanoate 25 mg injection earlier this morning. Injection was administered.

## 2020-03-11 NOTE — ED Triage Notes (Signed)
Pt c/o sinus pain.

## 2020-03-11 NOTE — ED Provider Notes (Signed)
[facility] EMERGENCY DEPARTMENT Provider Note   CSN: [id number] Arrival date & time: 03/10/20  2015     History Chief Complaint  Patient presents with  . Knee Pain    [name] is a 69 y.o. male.  The history is provided by the patient and medical records.  Knee Pain   70 y.o. M here with reported left knee pain. He is well known to this department for same, 101 visits in the past 6 months.  When asked about his knee pain states "its ok, not that bad".  Denies other complaints.  Denies injury.  Ambulatory here in ED.  Past Medical History:  Diagnosis Date  . BPH (benign prostatic hyperplasia)   . Colon polyp 2010  . Depression   . GERD (gastroesophageal reflux disease)   . Hepatitis C    "caught it when I had a blood transfusion"  . High cholesterol   . History of blood transfusion    "when I was young"  . Hypertension   . Paranoid schizophrenia ([place])   . Prostate atrophy   . Retinal vein occlusion     Patient Active Problem List   Diagnosis Date Noted  . GERD (gastroesophageal reflux disease) 05/03/2019  . Hypotension 08/02/2018  . Low back sprain, initial encounter 10/21/2017  . History of drug abuse ([place]) 10/18/2017  . Gingival erythema 09/23/2017  . Chronic pain of left knee 11/24/2016  . Tobacco abuse   . Diastolic heart failure ([place]) 02/05/2015  . Healthcare maintenance 05/28/2014  . Hyperlipidemia 03/10/2014  . Patellar tendonitis 11/16/2012  . [place] OCCLUSION 06/03/2010  . PARANOID SCHIZOPHRENIA, CHRONIC 01/17/2009  . HYPERTENSION, BENIGN ESSENTIAL 01/17/2009  . BPH (benign prostatic hyperplasia) 01/17/2009  . HEPATITIS C 12/14/2008  . DEPRESSION 12/14/2008    Past Surgical History:  Procedure Laterality Date  . CIRCUMCISION  1959  . TONSILLECTOMY         Family History  Problem Relation Age of Onset  . Hypertension Mother   . Diabetes Mother   . Stroke Mother     Social History   Tobacco Use  .  Smoking status: Current Every Day Smoker    Packs/day: 0.50    Years: 48.00    Pack years: 24.00    Types: Cigarettes  . Smokeless tobacco: Never Used  Substance Use Topics  . Alcohol use: Yes  . Drug use: No    Home Medications Prior to Admission medications   Medication Sig Start Date End Date Taking? Authorizing Provider  acetaminophen (TYLENOL) 500 MG tablet Take 1 tablet (500 mg total) by mouth every 6 (six) hours as needed. 04/23/18   [name], [name], PA-C  amLODipine (NORVASC) 10 MG tablet Take 1 tablet (10 mg total) by mouth daily. 04/13/18   [name], DO  aspirin 81 MG tablet Take 1 tablet (81 mg total) by mouth daily. 09/24/14   [name], MD  atorvastatin (LIPITOR) 40 MG tablet Take 1 tablet (40 mg total) by mouth daily. 08/15/19   [name], DO  benztropine (COGENTIN) 1 MG tablet Take 1 tablet (1 mg total) by mouth 2 (two) times daily. 02/26/20   Salley Slaughter, NP  cetirizine (ZYRTEC ALLERGY) 10 MG tablet Take 1 tablet (10 mg total) by mouth daily. 02/01/20   [name], [name], PA-C  famotidine (PEPCID) 20 MG tablet Take 1 tablet (20 mg total) by mouth 2 (two) times daily. 05/02/19   [name], DO  fluPHENAZine  decanoate (PROLIXIN) 25 MG/ML injection Inject 1 mL (25 mg total) into the muscle every 14 (fourteen) days. Last dose 09/07/12 02/26/20   Salley Slaughter, NP  fluticasone (FLONASE) 50 MCG/ACT nasal spray Place 2 sprays into both nostrils daily. 02/07/20   Nuala Alpha A, PA-C  hydrOXYzine (VISTARIL) 50 MG capsule Take 50 mg by mouth at bedtime. 02/01/20   [provider]  losartan (COZAAR) 100 MG tablet Take 1 tablet (100 mg total) by mouth daily. 11/28/19   Tammi Klippel, Sherin, DO  mometasone (NASONEX) 50 MCG/ACT nasal spray Place 2 sprays into the nose daily. 12/29/19   [name], PA-C  Multiple Vitamin (MULTIVITAMIN WITH MINERALS) TABS tablet Take 1 tablet by mouth daily. 06/28/14   [name], MD  sodium chloride (OCEAN)  0.65 % SOLN nasal spray Place 1 spray into both nostrils as needed for congestion. 11/19/19   [name], PA-C  tamsulosin (FLOMAX) 0.4 MG CAPS capsule TAKE 2 CAPSULE BY MOUTH DAILY Patient taking differently: Take 0.8 mg by mouth daily.  02/23/20   Mullis, Kiersten P, DO  traMADol (ULTRAM) 50 MG tablet Take 1 tablet (50 mg total) by mouth every 6 (six) hours as needed. 07/04/18   [name], MD  triamcinolone cream (KENALOG) 0.1 % Apply 1 application topically 2 (two) times daily. For 5 days, then twice daily as needed for rash and itching. 01/03/15   [name], MD  vitamin C (ASCORBIC ACID) 500 MG tablet Take 500 mg by mouth daily.    [provider]  loratadine (CLARITIN) 10 MG tablet Take 1 tablet (10 mg total) by mouth daily. 12/28/19 02/05/20  [name], DO    Allergies    Lisinopril, Penicillins, Amoxicillin, and Ibuprofen  Review of Systems   Review of Systems  Musculoskeletal: Positive for arthralgias.  All other systems reviewed and are negative.   Physical Exam Updated Vital Signs BP (!) 132/71   Pulse 58   Resp 17   Ht 5\' 9"  (1.753 m)   Wt 68 kg   SpO2 99%   BMI 22.14 kg/m   Physical Exam Vitals and nursing note reviewed.  Constitutional:      Appearance: He is well-developed.  HENT:     Head: Normocephalic and atraumatic.  Eyes:     Conjunctiva/sclera: Conjunctivae normal.     Pupils: Pupils are equal, round, and reactive to light.  Cardiovascular:     Rate and Rhythm: Normal rate and regular rhythm.     Heart sounds: Normal heart sounds.  Pulmonary:     Effort: Pulmonary effort is normal.     Breath sounds: Normal breath sounds.  Abdominal:     General: Bowel sounds are normal.     Palpations: Abdomen is soft.  Musculoskeletal:        General: Normal range of motion.     Cervical back: Normal range of motion.  Skin:    General: Skin is warm and dry.  Neurological:     Mental Status: He is alert and oriented to person, place, and  time.     ED Results / Procedures / Treatments   Labs (all labs ordered are listed, but only abnormal results are displayed) Labs Reviewed - No data to display  EKG None  Radiology No results found.  Procedures Procedures (including critical care time)  Medications Ordered in ED Medications - No data to display  ED Course  I have reviewed the triage vital signs and the nursing notes.  Pertinent labs & imaging results that were available during my care of the patient were reviewed by me and considered in my medical decision making (see chart for details).    MDM Rules/Calculators/A&P  69 year old male here with right knee pain.  He is well-known to this facility for same.  He appears at his baseline.  No complaints during my exam.  He remains ambulatory.  Stable for discharge.  Final Clinical Impression(s) / ED Diagnoses Final diagnoses:  Knee pain, unspecified chronicity, unspecified laterality    Rx / DC Orders ED Discharge Orders    None       [name], PA-C 03/11/20 0230    Margette Fast, MD 03/14/20 [phone]

## 2020-03-11 NOTE — ED Notes (Signed)
Patient verbalizes understanding of discharge instructions. Opportunity for questioning and answers were provided. Armband removed by staff, pt discharged from ED stable & ambulatory  

## 2020-03-11 NOTE — Social Work (Signed)
CSW met Pt at bedside. In light of high ED utilization, TOC team is attempting to establish relationship with Pt to be proactive in connecting Pt with resources.  Pt would not open eyes, nor speak or engage with CSW.  CSW will continue to attempt to engage Pt at next encounter.

## 2020-03-11 NOTE — ED Notes (Signed)
Discharge instructions discussed with pt. Pt verbalize understanding with no questions at this time.   Pt ambulatory at discharge. Given sprit. And escorted to exit.

## 2020-03-11 NOTE — ED Notes (Signed)
When asked to leave room due to discharge, pt called this RN a "disrespectful bitch," raised his hand & came toward this RN. Security contacted, at bedside to aid with discharge.

## 2020-03-12 NOTE — ED Provider Notes (Signed)
[facility] EMERGENCY DEPARTMENT Provider Note   CSN: [id number] Arrival date & time: 03/11/20  2114     History Chief Complaint  Patient presents with  . Facial Pain    [name] is a 69 y.o. male.  HPI   69 year old male who reportedly presented for facial pain.  When I spoke with him he tells me I started him requesting that I leave him alone so he can sleep.  Patient has noted to the emergency room and a very frequent utilizer of his resources.  Past Medical History:  Diagnosis Date  . BPH (benign prostatic hyperplasia)   . Colon polyp 2010  . Depression   . GERD (gastroesophageal reflux disease)   . Hepatitis C    "caught it when I had a blood transfusion"  . High cholesterol   . History of blood transfusion    "when I was young"  . Hypertension   . Paranoid schizophrenia ([place])   . Prostate atrophy   . Retinal vein occlusion     Patient Active Problem List   Diagnosis Date Noted  . GERD (gastroesophageal reflux disease) 05/03/2019  . Hypotension 08/02/2018  . Low back sprain, initial encounter 10/21/2017  . History of drug abuse ([place]) 10/18/2017  . Gingival erythema 09/23/2017  . Chronic pain of left knee 11/24/2016  . Tobacco abuse   . Diastolic heart failure ([place]) 02/05/2015  . Healthcare maintenance 05/28/2014  . Hyperlipidemia 03/10/2014  . Patellar tendonitis 11/16/2012  . [place] OCCLUSION 06/03/2010  . PARANOID SCHIZOPHRENIA, CHRONIC 01/17/2009  . HYPERTENSION, BENIGN ESSENTIAL 01/17/2009  . BPH (benign prostatic hyperplasia) 01/17/2009  . HEPATITIS C 12/14/2008  . DEPRESSION 12/14/2008    Past Surgical History:  Procedure Laterality Date  . CIRCUMCISION  1959  . TONSILLECTOMY         Family History  Problem Relation Age of Onset  . Hypertension Mother   . Diabetes Mother   . Stroke Mother     Social History   Tobacco Use  . Smoking status: Current Every Day Smoker    Packs/day: 0.50    Years:  48.00    Pack years: 24.00    Types: Cigarettes  . Smokeless tobacco: Never Used  Substance Use Topics  . Alcohol use: Yes  . Drug use: No    Home Medications Prior to Admission medications   Medication Sig Start Date End Date Taking? Authorizing Provider  acetaminophen (TYLENOL) 500 MG tablet Take 1 tablet (500 mg total) by mouth every 6 (six) hours as needed. 04/23/18   [name], [name], PA-C  amLODipine (NORVASC) 10 MG tablet Take 1 tablet (10 mg total) by mouth daily. 04/13/18   [name], DO  aspirin 81 MG tablet Take 1 tablet (81 mg total) by mouth daily. 09/24/14   [name], MD  atorvastatin (LIPITOR) 40 MG tablet Take 1 tablet (40 mg total) by mouth daily. 08/15/19   [name], DO  benztropine (COGENTIN) 1 MG tablet Take 1 tablet (1 mg total) by mouth 2 (two) times daily. 02/26/20   Salley Slaughter, NP  cetirizine (ZYRTEC ALLERGY) 10 MG tablet Take 1 tablet (10 mg total) by mouth daily. 02/01/20   [name], [name], PA-C  famotidine (PEPCID) 20 MG tablet Take 1 tablet (20 mg total) by mouth 2 (two) times daily. 05/02/19   [name], DO  fluPHENAZine decanoate (PROLIXIN) 25 MG/ML injection Inject 1 mL (25 mg total) into the muscle every 14 (  fourteen) days. Last dose 09/07/12 02/26/20   Salley Slaughter, NP  fluticasone (FLONASE) 50 MCG/ACT nasal spray Place 2 sprays into both nostrils daily. 02/07/20   Nuala Alpha A, PA-C  hydrOXYzine (VISTARIL) 50 MG capsule Take 50 mg by mouth at bedtime. 02/01/20   [provider]  losartan (COZAAR) 100 MG tablet Take 1 tablet (100 mg total) by mouth daily. 11/28/19   Tammi Klippel, Sherin, DO  mometasone (NASONEX) 50 MCG/ACT nasal spray Place 2 sprays into the nose daily. 12/29/19   [name], PA-C  Multiple Vitamin (MULTIVITAMIN WITH MINERALS) TABS tablet Take 1 tablet by mouth daily. 06/28/14   [name], MD  sodium chloride (OCEAN) 0.65 % SOLN nasal spray Place 1 spray into both nostrils as needed for  congestion. 11/19/19   [name], PA-C  tamsulosin (FLOMAX) 0.4 MG CAPS capsule TAKE 2 CAPSULE BY MOUTH DAILY Patient taking differently: Take 0.8 mg by mouth daily.  02/23/20   Mullis, Kiersten P, DO  traMADol (ULTRAM) 50 MG tablet Take 1 tablet (50 mg total) by mouth every 6 (six) hours as needed. 07/04/18   [name], MD  triamcinolone cream (KENALOG) 0.1 % Apply 1 application topically 2 (two) times daily. For 5 days, then twice daily as needed for rash and itching. 01/03/15   [name], MD  vitamin C (ASCORBIC ACID) 500 MG tablet Take 500 mg by mouth daily.    [provider]  loratadine (CLARITIN) 10 MG tablet Take 1 tablet (10 mg total) by mouth daily. 12/28/19 02/05/20  [name], DO    Allergies    Lisinopril, Penicillins, Amoxicillin, and Ibuprofen  Review of Systems   Review of Systems All systems reviewed and negative, other than as noted in HPI. Physical Exam Updated Vital Signs BP (!) 130/84 (BP Location: Right Arm)   Pulse 67   Temp 97.8 F (36.6 C) (Oral)   Resp 16   SpO2 99%   Physical Exam Vitals and nursing note reviewed.  Constitutional:      General: He is not in acute distress.    Appearance: He is well-developed.     Comments: Disheveled appearance.  No acute distress.  HENT:     Head: Normocephalic and atraumatic.  Eyes:     General:        Right eye: No discharge.        Left eye: No discharge.     Conjunctiva/sclera: Conjunctivae normal.  Cardiovascular:     Rate and Rhythm: Normal rate and regular rhythm.     Heart sounds: Normal heart sounds. No murmur heard.  No friction rub. No gallop.   Pulmonary:     Effort: Pulmonary effort is normal. No respiratory distress.     Breath sounds: Normal breath sounds.  Abdominal:     General: There is no distension.     Palpations: Abdomen is soft.     Tenderness: There is no abdominal tenderness.  Musculoskeletal:        General: No tenderness.     Cervical back: Neck supple.    Skin:    General: Skin is warm and dry.  Neurological:     Mental Status: He is alert.     ED Results / Procedures / Treatments   Labs (all labs ordered are listed, but only abnormal results are displayed) Labs Reviewed - No data to display  EKG None  Radiology No results found.  Procedures Procedures (including critical care time)  Medications Ordered  in ED Medications - No data to display  ED Course  I have reviewed the triage vital signs and the nursing notes.  Pertinent labs & imaging results that were available during my care of the patient were reviewed by me and considered in my medical decision making (see chart for details).    MDM Rules/Calculators/A&P                         69 year old male with facial pain?  Exam pretty unremarkable.  Patient aggressive and generally rude to staff.  I doubt emergent process.  Discharge.  Final Clinical Impression(s) / ED Diagnoses Final diagnoses:  Facial pain    Rx / DC Orders ED Discharge Orders    None       Virgel Manifold, MD 03/12/20 [phone]

## 2020-03-13 ENCOUNTER — Other Ambulatory Visit: Payer: Self-pay

## 2020-03-13 ENCOUNTER — Emergency Department (HOSPITAL_COMMUNITY)
Admission: EM | Admit: 2020-03-13 | Discharge: 2020-03-13 | Disposition: A | Discharge status: Home / Self Care | Payer: Medicare Other | Attending: Emergency Medicine | Admitting: Emergency Medicine

## 2020-03-13 DIAGNOSIS — Z5321 Procedure and treatment not carried out due to patient leaving prior to being seen by health care provider: Secondary | ICD-10-CM | POA: Insufficient documentation

## 2020-03-13 DIAGNOSIS — M25561 Pain in right knee: Secondary | ICD-10-CM | POA: Insufficient documentation

## 2020-03-13 NOTE — ED Notes (Signed)
Pt has not returned from smoking.

## 2020-03-13 NOTE — ED Triage Notes (Signed)
Pt with right knee pain. Ambulatory to lobby desk

## 2020-03-13 NOTE — ED Notes (Signed)
Pt name called for update vitals, no response. However pt stated he was going to smoke a cigarette eailer

## 2020-03-14 ENCOUNTER — Emergency Department (HOSPITAL_COMMUNITY)
Admission: EM | Admit: 2020-03-14 | Discharge: 2020-03-14 | Disposition: A | Discharge status: Home / Self Care | Payer: Medicare Other | Source: Home / Self Care | Attending: Emergency Medicine | Admitting: Emergency Medicine

## 2020-03-14 ENCOUNTER — Emergency Department (HOSPITAL_COMMUNITY)
Admission: EM | Admit: 2020-03-14 | Discharge: 2020-03-14 | Disposition: A | Discharge status: Home / Self Care | Payer: Medicare Other | Attending: Emergency Medicine | Admitting: Emergency Medicine

## 2020-03-14 DIAGNOSIS — G8929 Other chronic pain: Secondary | ICD-10-CM

## 2020-03-14 DIAGNOSIS — M25562 Pain in left knee: Secondary | ICD-10-CM

## 2020-03-14 DIAGNOSIS — Z7982 Long term (current) use of aspirin: Secondary | ICD-10-CM | POA: Diagnosis not present

## 2020-03-14 DIAGNOSIS — I5030 Unspecified diastolic (congestive) heart failure: Secondary | ICD-10-CM | POA: Insufficient documentation

## 2020-03-14 DIAGNOSIS — F17210 Nicotine dependence, cigarettes, uncomplicated: Secondary | ICD-10-CM | POA: Insufficient documentation

## 2020-03-14 DIAGNOSIS — Z79899 Other long term (current) drug therapy: Secondary | ICD-10-CM | POA: Insufficient documentation

## 2020-03-14 DIAGNOSIS — I110 Hypertensive heart disease with heart failure: Secondary | ICD-10-CM | POA: Diagnosis not present

## 2020-03-14 DIAGNOSIS — Z590 Homelessness: Secondary | ICD-10-CM | POA: Insufficient documentation

## 2020-03-14 DIAGNOSIS — I10 Essential (primary) hypertension: Secondary | ICD-10-CM | POA: Insufficient documentation

## 2020-03-14 MED ORDER — ACETAMINOPHEN 325 MG PO TABS
650.0000 mg | ORAL_TABLET | Freq: Once | ORAL | Status: AC
  Administered 2020-03-14: 650 mg via ORAL
  Filled 2020-03-14: qty 2

## 2020-03-14 NOTE — ED Provider Notes (Signed)
[facility] EMERGENCY DEPARTMENT Provider Note   CSN: [id number] Arrival date & time: 03/14/20  0012     History Chief Complaint  Patient presents with  . Knee Pain    [name] is a 69 y.o. male.  Patient presents the emergency department with chronic left knee pain.  Denies new injuries.  He is ambulatory.  He has not taken any medication for it.  He states that he already try to apply ice.  No distal numbness or tingling.  No other complaints.  Patient well-known to the emergency department, now with 104 visits in the past 6 months.        Past Medical History:  Diagnosis Date  . BPH (benign prostatic hyperplasia)   . Colon polyp 2010  . Depression   . GERD (gastroesophageal reflux disease)   . Hepatitis C    "caught it when I had a blood transfusion"  . High cholesterol   . History of blood transfusion    "when I was young"  . Hypertension   . Paranoid schizophrenia ([place])   . Prostate atrophy   . Retinal vein occlusion     Patient Active Problem List   Diagnosis Date Noted  . GERD (gastroesophageal reflux disease) 05/03/2019  . Hypotension 08/02/2018  . Low back sprain, initial encounter 10/21/2017  . History of drug abuse ([place]) 10/18/2017  . Gingival erythema 09/23/2017  . Chronic pain of left knee 11/24/2016  . Tobacco abuse   . Diastolic heart failure ([place]) 02/05/2015  . Healthcare maintenance 05/28/2014  . Hyperlipidemia 03/10/2014  . Patellar tendonitis 11/16/2012  . [place] OCCLUSION 06/03/2010  . PARANOID SCHIZOPHRENIA, CHRONIC 01/17/2009  . HYPERTENSION, BENIGN ESSENTIAL 01/17/2009  . BPH (benign prostatic hyperplasia) 01/17/2009  . HEPATITIS C 12/14/2008  . DEPRESSION 12/14/2008    Past Surgical History:  Procedure Laterality Date  . CIRCUMCISION  1959  . TONSILLECTOMY         Family History  Problem Relation Age of Onset  . Hypertension Mother   . Diabetes Mother   . Stroke Mother     Social  History   Tobacco Use  . Smoking status: Current Every Day Smoker    Packs/day: 0.50    Years: 48.00    Pack years: 24.00    Types: Cigarettes  . Smokeless tobacco: Never Used  Substance Use Topics  . Alcohol use: Yes  . Drug use: No    Home Medications Prior to Admission medications   Medication Sig Start Date End Date Taking? Authorizing Provider  acetaminophen (TYLENOL) 500 MG tablet Take 1 tablet (500 mg total) by mouth every 6 (six) hours as needed. 04/23/18   [name], [name], PA-C  amLODipine (NORVASC) 10 MG tablet Take 1 tablet (10 mg total) by mouth daily. 04/13/18   [name], DO  aspirin 81 MG tablet Take 1 tablet (81 mg total) by mouth daily. 09/24/14   [name], MD  atorvastatin (LIPITOR) 40 MG tablet Take 1 tablet (40 mg total) by mouth daily. 08/15/19   [name], DO  benztropine (COGENTIN) 1 MG tablet Take 1 tablet (1 mg total) by mouth 2 (two) times daily. 02/26/20   Salley Slaughter, NP  cetirizine (ZYRTEC ALLERGY) 10 MG tablet Take 1 tablet (10 mg total) by mouth daily. 02/01/20   [name], [name], PA-C  famotidine (PEPCID) 20 MG tablet Take 1 tablet (20 mg total) by mouth 2 (two) times daily. 05/02/19   Tammi Klippel,  Sherin, DO  fluPHENAZine decanoate (PROLIXIN) 25 MG/ML injection Inject 1 mL (25 mg total) into the muscle every 14 (fourteen) days. Last dose 09/07/12 02/26/20   Salley Slaughter, NP  fluticasone (FLONASE) 50 MCG/ACT nasal spray Place 2 sprays into both nostrils daily. 02/07/20   Nuala Alpha A, PA-C  hydrOXYzine (VISTARIL) 50 MG capsule Take 50 mg by mouth at bedtime. 02/01/20   [provider]  losartan (COZAAR) 100 MG tablet Take 1 tablet (100 mg total) by mouth daily. 11/28/19   Tammi Klippel, Sherin, DO  mometasone (NASONEX) 50 MCG/ACT nasal spray Place 2 sprays into the nose daily. 12/29/19   [name], PA-C  Multiple Vitamin (MULTIVITAMIN WITH MINERALS) TABS tablet Take 1 tablet by mouth daily. 06/28/14   [name], MD   sodium chloride (OCEAN) 0.65 % SOLN nasal spray Place 1 spray into both nostrils as needed for congestion. 11/19/19   [name], PA-C  tamsulosin (FLOMAX) 0.4 MG CAPS capsule TAKE 2 CAPSULE BY MOUTH DAILY Patient taking differently: Take 0.8 mg by mouth daily.  02/23/20   Mullis, Kiersten P, DO  traMADol (ULTRAM) 50 MG tablet Take 1 tablet (50 mg total) by mouth every 6 (six) hours as needed. 07/04/18   [name], MD  triamcinolone cream (KENALOG) 0.1 % Apply 1 application topically 2 (two) times daily. For 5 days, then twice daily as needed for rash and itching. 01/03/15   [name], MD  vitamin C (ASCORBIC ACID) 500 MG tablet Take 500 mg by mouth daily.    [provider]  loratadine (CLARITIN) 10 MG tablet Take 1 tablet (10 mg total) by mouth daily. 12/28/19 02/05/20  [name], DO    Allergies    Lisinopril, Penicillins, Amoxicillin, and Ibuprofen  Review of Systems   Review of Systems  Constitutional: Negative for activity change.  Musculoskeletal: Positive for arthralgias. Negative for gait problem and neck pain.  Skin: Negative for wound.  Neurological: Negative for weakness and numbness.    Physical Exam Updated Vital Signs BP 124/80   Pulse 69   Temp (!) 97 F (36.1 C) (Oral)   Resp 16   SpO2 99%   Physical Exam Vitals and nursing note reviewed.  Constitutional:      Appearance: He is well-developed.  HENT:     Head: Normocephalic and atraumatic.  Eyes:     Conjunctiva/sclera: Conjunctivae normal.  Cardiovascular:     Pulses: Normal pulses. No decreased pulses.  Musculoskeletal:        General: Tenderness present.     Cervical back: Normal range of motion and neck supple.     Left hip: No tenderness. Normal range of motion.     Right knee: No tenderness.     Left knee: Normal range of motion. Tenderness present. No lateral joint line tenderness.     Left ankle: No tenderness. Normal range of motion.  Skin:    General: Skin is warm and  dry.  Neurological:     Mental Status: He is alert.     Sensory: No sensory deficit.     Comments: Motor, sensation, and vascular distal to the injury is fully intact.  Patient witnessed standing up and bending over to tie his shoe.  He flexes at the knee without any guarding.  He is able to take several steps without any sign of pain.     ED Results / Procedures / Treatments   Labs (all labs ordered are listed, but only abnormal  results are displayed) Labs Reviewed - No data to display  EKG None  Radiology No results found.  Procedures Procedures (including critical care time)  Medications Ordered in ED Medications - No data to display  ED Course  I have reviewed the triage vital signs and the nursing notes.  Pertinent labs & imaging results that were available during my care of the patient were reviewed by me and considered in my medical decision making (see chart for details).  Patient seen and examined.  He is medically cleared for discharge.    Vital signs reviewed and are as follows: BP 124/80   Pulse 69   Temp (!) 97 F (36.1 C) (Oral)   Resp 16   SpO2 99%       MDM Rules/Calculators/A&P                          Patient here with chronic knee pain.  Exam is unremarkable.  No signs of effusion, and I doubt septic arthritis, inflammatory arthritis, fracture.  No indication for further work-up at this time.  Patient counseled on conservative measures.   Final Clinical Impression(s) / ED Diagnoses Final diagnoses:  Chronic pain of left knee    Rx / DC Orders ED Discharge Orders    None       Carlisle Cater, PA-C 03/14/20 1840    Orpah Greek, MD 03/14/20 [phone]

## 2020-03-14 NOTE — Discharge Instructions (Signed)
Take tylenol for pain   See your doctor  Return to ER if you have worse knee pain, unable to walk.

## 2020-03-14 NOTE — ED Notes (Signed)
Patient verbalizes understanding of discharge instructions. Opportunity for questioning and answers were provided. Armband removed by staff, pt discharged from ED ambulatory.   

## 2020-03-14 NOTE — ED Provider Notes (Signed)
[facility] EMERGENCY DEPARTMENT Provider Note   CSN: [id number] Arrival date & time: 03/14/20  2153     History No chief complaint on file.   [name] is a 69 y.o. male history of reflux, high cholesterol, hypertension, chronic left knee pain here presenting with left knee pain.  Patient states that he has persistent pain in his left knee.  Denies any trauma or injury.  Patient was seen in the ED yesterday for exactly the same complaints.  Patient had over 100 ED visits recently.  Patient states that he is homeless.   The history is provided by the patient.       Past Medical History:  Diagnosis Date  . BPH (benign prostatic hyperplasia)   . Colon polyp 2010  . Depression   . GERD (gastroesophageal reflux disease)   . Hepatitis C    "caught it when I had a blood transfusion"  . High cholesterol   . History of blood transfusion    "when I was young"  . Hypertension   . Paranoid schizophrenia ([place])   . Prostate atrophy   . Retinal vein occlusion     Patient Active Problem List   Diagnosis Date Noted  . GERD (gastroesophageal reflux disease) 05/03/2019  . Hypotension 08/02/2018  . Low back sprain, initial encounter 10/21/2017  . History of drug abuse ([place]) 10/18/2017  . Gingival erythema 09/23/2017  . Chronic pain of left knee 11/24/2016  . Tobacco abuse   . Diastolic heart failure ([place]) 02/05/2015  . Healthcare maintenance 05/28/2014  . Hyperlipidemia 03/10/2014  . Patellar tendonitis 11/16/2012  . [place] OCCLUSION 06/03/2010  . PARANOID SCHIZOPHRENIA, CHRONIC 01/17/2009  . HYPERTENSION, BENIGN ESSENTIAL 01/17/2009  . BPH (benign prostatic hyperplasia) 01/17/2009  . HEPATITIS C 12/14/2008  . DEPRESSION 12/14/2008    Past Surgical History:  Procedure Laterality Date  . CIRCUMCISION  1959  . TONSILLECTOMY         Family History  Problem Relation Age of Onset  . Hypertension Mother   . Diabetes Mother   . Stroke  Mother     Social History   Tobacco Use  . Smoking status: Current Every Day Smoker    Packs/day: 0.50    Years: 48.00    Pack years: 24.00    Types: Cigarettes  . Smokeless tobacco: Never Used  Substance Use Topics  . Alcohol use: Yes  . Drug use: No    Home Medications Prior to Admission medications   Medication Sig Start Date End Date Taking? Authorizing Provider  acetaminophen (TYLENOL) 500 MG tablet Take 1 tablet (500 mg total) by mouth every 6 (six) hours as needed. 04/23/18   [name], [name], PA-C  amLODipine (NORVASC) 10 MG tablet Take 1 tablet (10 mg total) by mouth daily. 04/13/18   [name], DO  aspirin 81 MG tablet Take 1 tablet (81 mg total) by mouth daily. 09/24/14   [name], MD  atorvastatin (LIPITOR) 40 MG tablet Take 1 tablet (40 mg total) by mouth daily. 08/15/19   [name], DO  benztropine (COGENTIN) 1 MG tablet Take 1 tablet (1 mg total) by mouth 2 (two) times daily. 02/26/20   Salley Slaughter, NP  cetirizine (ZYRTEC ALLERGY) 10 MG tablet Take 1 tablet (10 mg total) by mouth daily. 02/01/20   [name], [name], PA-C  famotidine (PEPCID) 20 MG tablet Take 1 tablet (20 mg total) by mouth 2 (two) times daily. 05/02/19   Tammi Klippel,  Sherin, DO  fluPHENAZine decanoate (PROLIXIN) 25 MG/ML injection Inject 1 mL (25 mg total) into the muscle every 14 (fourteen) days. Last dose 09/07/12 02/26/20   Salley Slaughter, NP  fluticasone (FLONASE) 50 MCG/ACT nasal spray Place 2 sprays into both nostrils daily. 02/07/20   Nuala Alpha A, PA-C  hydrOXYzine (VISTARIL) 50 MG capsule Take 50 mg by mouth at bedtime. 02/01/20   [provider]  losartan (COZAAR) 100 MG tablet Take 1 tablet (100 mg total) by mouth daily. 11/28/19   Tammi Klippel, Sherin, DO  mometasone (NASONEX) 50 MCG/ACT nasal spray Place 2 sprays into the nose daily. 12/29/19   [name], PA-C  Multiple Vitamin (MULTIVITAMIN WITH MINERALS) TABS tablet Take 1 tablet by mouth daily. 06/28/14    [name], MD  sodium chloride (OCEAN) 0.65 % SOLN nasal spray Place 1 spray into both nostrils as needed for congestion. 11/19/19   [name], PA-C  tamsulosin (FLOMAX) 0.4 MG CAPS capsule TAKE 2 CAPSULE BY MOUTH DAILY Patient taking differently: Take 0.8 mg by mouth daily.  02/23/20   Mullis, Kiersten P, DO  traMADol (ULTRAM) 50 MG tablet Take 1 tablet (50 mg total) by mouth every 6 (six) hours as needed. 07/04/18   [name], MD  triamcinolone cream (KENALOG) 0.1 % Apply 1 application topically 2 (two) times daily. For 5 days, then twice daily as needed for rash and itching. 01/03/15   [name], MD  vitamin C (ASCORBIC ACID) 500 MG tablet Take 500 mg by mouth daily.    [provider]  loratadine (CLARITIN) 10 MG tablet Take 1 tablet (10 mg total) by mouth daily. 12/28/19 02/05/20  [name], DO    Allergies    Lisinopril, Penicillins, Amoxicillin, and Ibuprofen  Review of Systems   Review of Systems  Musculoskeletal:       L knee pain   All other systems reviewed and are negative.   Physical Exam Updated Vital Signs BP 121/82   Pulse 85   Temp 99 F (37.2 C) (Oral)   Resp 18   SpO2 100%   Physical Exam Vitals and nursing note reviewed.  HENT:     Head: Normocephalic.     Nose: Nose normal.     Mouth/Throat:     Mouth: Mucous membranes are moist.  Eyes:     Extraocular Movements: Extraocular movements intact.     Pupils: Pupils are equal, round, and reactive to light.  Cardiovascular:     Rate and Rhythm: Normal rate and regular rhythm.     Pulses: Normal pulses.     Heart sounds: Normal heart sounds.  Pulmonary:     Effort: Pulmonary effort is normal.     Breath sounds: Normal breath sounds.  Abdominal:     General: Abdomen is flat.  Musculoskeletal:     Cervical back: Normal range of motion.     Comments: Mild tenderness over the patella tendon but patient is able to extend the knee.  Normal range of motion of the knee.  No  signs of septic joint.  ACL and PCL appears intact.  Patient able to bear some weight on the leg.  Skin:    General: Skin is warm.     Capillary Refill: Capillary refill takes less than 2 seconds.  Neurological:     General: No focal deficit present.     Mental Status: He is alert and oriented to person, place, and time.  Psychiatric:  Mood and Affect: Mood normal.        Behavior: Behavior normal.     ED Results / Procedures / Treatments   Labs (all labs ordered are listed, but only abnormal results are displayed) Labs Reviewed - No data to display  EKG None  Radiology No results found.  Procedures Procedures (including critical care time)  Medications Ordered in ED Medications  acetaminophen (TYLENOL) tablet 650 mg (650 mg Oral Given 03/14/20 2226)    ED Course  I have reviewed the triage vital signs and the nursing notes.  Pertinent labs & imaging results that were available during my care of the patient were reviewed by me and considered in my medical decision making (see chart for details).    MDM Rules/Calculators/A&P                          [name] is a 69 y.o. male here presenting with left knee pain.  This is a chronic problem.  Patient had no trauma or injury.  Given some Tylenol and patient is stable for discharge.  Gave strict return precautions.  Final Clinical Impression(s) / ED Diagnoses Final diagnoses:  Chronic pain of left knee    Rx / DC Orders ED Discharge Orders    None       Drenda Freeze, MD 03/14/20 2242

## 2020-03-14 NOTE — ED Triage Notes (Signed)
Pt presents w chronic knee pain. Ambulating normally

## 2020-03-17 ENCOUNTER — Encounter (HOSPITAL_COMMUNITY): Payer: Self-pay | Admitting: Emergency Medicine

## 2020-03-17 ENCOUNTER — Emergency Department (HOSPITAL_COMMUNITY)
Admission: EM | Admit: 2020-03-17 | Discharge: 2020-03-17 | Disposition: A | Discharge status: Home / Self Care | Payer: Medicare Other | Attending: Emergency Medicine | Admitting: Emergency Medicine

## 2020-03-17 ENCOUNTER — Other Ambulatory Visit: Payer: Self-pay

## 2020-03-17 DIAGNOSIS — I5032 Chronic diastolic (congestive) heart failure: Secondary | ICD-10-CM | POA: Diagnosis not present

## 2020-03-17 DIAGNOSIS — Z7982 Long term (current) use of aspirin: Secondary | ICD-10-CM | POA: Insufficient documentation

## 2020-03-17 DIAGNOSIS — M25562 Pain in left knee: Secondary | ICD-10-CM | POA: Insufficient documentation

## 2020-03-17 DIAGNOSIS — I110 Hypertensive heart disease with heart failure: Secondary | ICD-10-CM | POA: Diagnosis not present

## 2020-03-17 DIAGNOSIS — J3489 Other specified disorders of nose and nasal sinuses: Secondary | ICD-10-CM | POA: Diagnosis not present

## 2020-03-17 DIAGNOSIS — J349 Unspecified disorder of nose and nasal sinuses: Secondary | ICD-10-CM

## 2020-03-17 DIAGNOSIS — Z79899 Other long term (current) drug therapy: Secondary | ICD-10-CM | POA: Diagnosis not present

## 2020-03-17 DIAGNOSIS — F17210 Nicotine dependence, cigarettes, uncomplicated: Secondary | ICD-10-CM | POA: Insufficient documentation

## 2020-03-17 DIAGNOSIS — G8929 Other chronic pain: Secondary | ICD-10-CM | POA: Diagnosis not present

## 2020-03-17 MED ORDER — FLUTICASONE PROPIONATE 50 MCG/ACT NA SUSP
2.0000 | Freq: Every day | NASAL | 0 refills | Status: DC
Start: 2020-03-17 — End: 2020-05-09

## 2020-03-17 MED ORDER — ACETAMINOPHEN 325 MG PO TABS: 650.0000 mg | ORAL_TABLET | Freq: Four times a day (QID) | ORAL | 0 refills | Status: AC | PRN

## 2020-03-17 NOTE — Discharge Instructions (Signed)
Please follow up with your primary care provider within 5-7 days for re-evaluation of your symptoms. If you do not have a primary care provider, information for a healthcare clinic has been provided for you to make arrangements for follow up care. Please return to the emergency department for any new or worsening symptoms. ° °

## 2020-03-17 NOTE — ED Triage Notes (Signed)
Patient reports left knee pain today , denies injury/ambulatory , patient added nasal congestion.

## 2020-03-17 NOTE — ED Provider Notes (Signed)
[facility] EMERGENCY DEPARTMENT Provider Note   CSN: [id number] Arrival date & time: 03/17/20  0016     History Chief Complaint  Patient presents with   Knee Pain / Sinus Congestion    [name] is a 69 y.o. male.  HPI   69 year old male with history of BPH, depression, GERD, hep C, hyperlipidemia, hypertension, paranoid schizophrenia, who presents the emergency department today for evaluation of knee pain.  On chart review, patient has been seen in the emergency department countless times for chronic knee pain.  He denies any new injury today.  He also complains of some sinus problems which are also chronic.  Denies any other complaints.  Past Medical History:  Diagnosis Date   BPH (benign prostatic hyperplasia)    Colon polyp 2010   Depression    GERD (gastroesophageal reflux disease)    Hepatitis C    "caught it when I had a blood transfusion"   High cholesterol    History of blood transfusion    "when I was young"   Hypertension    Paranoid schizophrenia ([place])    Prostate atrophy    Retinal vein occlusion     Patient Active Problem List   Diagnosis Date Noted   GERD (gastroesophageal reflux disease) 05/03/2019   Hypotension 08/02/2018   Low back sprain, initial encounter 10/21/2017   History of drug abuse ([place]) 10/18/2017   Gingival erythema 09/23/2017   Chronic pain of left knee 11/24/2016   Tobacco abuse    Diastolic heart failure ([place]) 02/05/2015   Healthcare maintenance 05/28/2014   Hyperlipidemia 03/10/2014   Patellar tendonitis 11/16/2012   BRANCH RETINAL VEIN OCCLUSION 06/03/2010   PARANOID SCHIZOPHRENIA, CHRONIC 01/17/2009   HYPERTENSION, BENIGN ESSENTIAL 01/17/2009   BPH (benign prostatic hyperplasia) 01/17/2009   HEPATITIS C 12/14/2008   DEPRESSION 12/14/2008    Past Surgical History:  Procedure Laterality Date   CIRCUMCISION  1959   TONSILLECTOMY         Family History  Problem  Relation Age of Onset   Hypertension Mother    Diabetes Mother    Stroke Mother     Social History   Tobacco Use   Smoking status: Current Every Day Smoker    Packs/day: 0.50    Years: 48.00    Pack years: 24.00    Types: Cigarettes   Smokeless tobacco: Never Used  Substance Use Topics   Alcohol use: Yes   Drug use: No    Home Medications Prior to Admission medications   Medication Sig Start Date End Date Taking? Authorizing Provider  acetaminophen (TYLENOL) 325 MG tablet Take 2 tablets (650 mg total) by mouth every 6 (six) hours as needed for up to 5 days. Do not take more than 4000mg  of tylenol per day 03/17/20 03/22/20  Mikenna Bunkley S, PA-C  amLODipine (NORVASC) 10 MG tablet Take 1 tablet (10 mg total) by mouth daily. 04/13/18   [name], DO  aspirin 81 MG tablet Take 1 tablet (81 mg total) by mouth daily. 09/24/14   [name], MD  atorvastatin (LIPITOR) 40 MG tablet Take 1 tablet (40 mg total) by mouth daily. 08/15/19   [name], DO  benztropine (COGENTIN) 1 MG tablet Take 1 tablet (1 mg total) by mouth 2 (two) times daily. 02/26/20   Salley Slaughter, NP  cetirizine (ZYRTEC ALLERGY) 10 MG tablet Take 1 tablet (10 mg total) by mouth daily. 02/01/20   Nils Flack, [name], PA-C  famotidine (PEPCID)  20 MG tablet Take 1 tablet (20 mg total) by mouth 2 (two) times daily. 05/02/19   [name], DO  fluPHENAZine decanoate (PROLIXIN) 25 MG/ML injection Inject 1 mL (25 mg total) into the muscle every 14 (fourteen) days. Last dose 09/07/12 02/26/20   Salley Slaughter, NP  fluticasone (FLONASE) 50 MCG/ACT nasal spray Place 2 sprays into both nostrils daily. 03/17/20   Kyliah Deanda S, PA-C  hydrOXYzine (VISTARIL) 50 MG capsule Take 50 mg by mouth at bedtime. 02/01/20   [provider]  losartan (COZAAR) 100 MG tablet Take 1 tablet (100 mg total) by mouth daily. 11/28/19   Tammi Klippel, Sherin, DO  mometasone (NASONEX) 50 MCG/ACT nasal spray Place 2 sprays into  the nose daily. 12/29/19   [name], PA-C  Multiple Vitamin (MULTIVITAMIN WITH MINERALS) TABS tablet Take 1 tablet by mouth daily. 06/28/14   [name], MD  sodium chloride (OCEAN) 0.65 % SOLN nasal spray Place 1 spray into both nostrils as needed for congestion. 11/19/19   [name], PA-C  tamsulosin (FLOMAX) 0.4 MG CAPS capsule TAKE 2 CAPSULE BY MOUTH DAILY Patient taking differently: Take 0.8 mg by mouth daily.  02/23/20   Mullis, Kiersten P, DO  traMADol (ULTRAM) 50 MG tablet Take 1 tablet (50 mg total) by mouth every 6 (six) hours as needed. 07/04/18   [name], MD  triamcinolone cream (KENALOG) 0.1 % Apply 1 application topically 2 (two) times daily. For 5 days, then twice daily as needed for rash and itching. 01/03/15   [name], MD  vitamin C (ASCORBIC ACID) 500 MG tablet Take 500 mg by mouth daily.    [provider]  loratadine (CLARITIN) 10 MG tablet Take 1 tablet (10 mg total) by mouth daily. 12/28/19 02/05/20  [name], DO    Allergies    Lisinopril, Penicillins, Amoxicillin, and Ibuprofen  Review of Systems   Review of Systems  Constitutional: Negative for fever.  HENT: Positive for congestion and rhinorrhea.   Respiratory: Negative for cough and shortness of breath.   Cardiovascular: Negative for chest pain.  Musculoskeletal:       Left knee pain    Physical Exam Updated Vital Signs BP (!) 149/99    Pulse 99    Temp 98.9 F (37.2 C) (Oral)    Resp 18    SpO2 98%   Physical Exam Vitals and nursing note reviewed.  Constitutional:      Appearance: He is well-developed.  HENT:     Head: Normocephalic and atraumatic.     Nose:     Comments: bilat nasal turbinates swollen, (R>L) Eyes:     Conjunctiva/sclera: Conjunctivae normal.  Cardiovascular:     Rate and Rhythm: Normal rate.  Pulmonary:     Effort: Pulmonary effort is normal.  Abdominal:     Palpations: Abdomen is soft.  Musculoskeletal:     Cervical back: Neck  supple.     Right lower leg: No edema.     Left lower leg: No edema.     Comments: No TTP to the left knee or to the bilat calves. Full and painless ROM intact. No erythema, swelling, warmth to the knee. Ambulatory with steady gait.   Skin:    General: Skin is warm and dry.  Neurological:     Mental Status: He is alert.     ED Results / Procedures / Treatments   Labs (all labs ordered are listed, but only abnormal results are displayed) Labs  Reviewed - No data to display  EKG None  Radiology No results found.  Procedures Procedures (including critical care time)  Medications Ordered in ED Medications - No data to display  ED Course  I have reviewed the triage vital signs and the nursing notes.  Pertinent labs & imaging results that were available during my care of the patient were reviewed by me and considered in my medical decision making (see chart for details).    MDM Rules/Calculators/A&P                          69 year old male presenting for evaluation of left knee pain and sinus problems.  Has been seen in the ED countless times for similar presentations and has a history of this chronically.  No new injury to suggest fracture or acute cause of pain to the knee.  Will give Rx for Tylenol.  Also complaining of sinus problems.  Again this is a chronic complaint.  He is afebrile here therefore I doubt bacterial sinusitis.  Will give Rx for fluticasone.  Advised PCP follow-up and return precautions.  Voiced understanding plan agrees to return.  Questions answered.  Patient stable for discharge.  Final Clinical Impression(s) / ED Diagnoses Final diagnoses:  Chronic pain of left knee  Sinus problem    Rx / DC Orders ED Discharge Orders         Ordered    acetaminophen (TYLENOL) 325 MG tablet  Every 6 hours PRN     Discontinue  Reprint     03/17/20 0926    fluticasone (FLONASE) 50 MCG/ACT nasal spray  Daily     Discontinue  Reprint     03/17/20 0926             Rodney Booze, PA-C 03/17/20 2993    Charlesetta Shanks, MD 03/20/20 1049

## 2020-03-19 ENCOUNTER — Emergency Department (HOSPITAL_COMMUNITY)
Admission: EM | Admit: 2020-03-19 | Discharge: 2020-03-20 | Disposition: A | Discharge status: Home / Self Care | Payer: Medicare Other | Attending: Emergency Medicine | Admitting: Emergency Medicine

## 2020-03-19 DIAGNOSIS — I10 Essential (primary) hypertension: Secondary | ICD-10-CM | POA: Diagnosis not present

## 2020-03-19 DIAGNOSIS — F17210 Nicotine dependence, cigarettes, uncomplicated: Secondary | ICD-10-CM | POA: Insufficient documentation

## 2020-03-19 DIAGNOSIS — Z79899 Other long term (current) drug therapy: Secondary | ICD-10-CM | POA: Diagnosis not present

## 2020-03-19 DIAGNOSIS — Z7982 Long term (current) use of aspirin: Secondary | ICD-10-CM | POA: Insufficient documentation

## 2020-03-19 DIAGNOSIS — R030 Elevated blood-pressure reading, without diagnosis of hypertension: Secondary | ICD-10-CM

## 2020-03-19 NOTE — Social Work (Signed)
CSW met with Pt in [place]. Pt shared with CSW information regarding community case worker(in the form of a letter Pt received in the mail) Pt gave verbal permission for CSW to reach out to this community via phone Delma Freeze @ [phone]  Case [phone] CSW will follow up tomorrow 8/4

## 2020-03-19 NOTE — ED Triage Notes (Signed)
Patient reports high blood pressure. Seen every day

## 2020-03-20 ENCOUNTER — Emergency Department (HOSPITAL_COMMUNITY)
Admission: EM | Admit: 2020-03-20 | Discharge: 2020-03-21 | Disposition: A | Discharge status: Home / Self Care | Payer: Medicare Other | Source: Home / Self Care | Attending: Emergency Medicine | Admitting: Emergency Medicine

## 2020-03-20 ENCOUNTER — Telehealth: Payer: Self-pay

## 2020-03-20 DIAGNOSIS — M25569 Pain in unspecified knee: Secondary | ICD-10-CM

## 2020-03-20 DIAGNOSIS — I10 Essential (primary) hypertension: Secondary | ICD-10-CM | POA: Diagnosis not present

## 2020-03-20 DIAGNOSIS — I110 Hypertensive heart disease with heart failure: Secondary | ICD-10-CM | POA: Insufficient documentation

## 2020-03-20 DIAGNOSIS — I5030 Unspecified diastolic (congestive) heart failure: Secondary | ICD-10-CM | POA: Insufficient documentation

## 2020-03-20 DIAGNOSIS — Z79899 Other long term (current) drug therapy: Secondary | ICD-10-CM | POA: Insufficient documentation

## 2020-03-20 DIAGNOSIS — F17210 Nicotine dependence, cigarettes, uncomplicated: Secondary | ICD-10-CM | POA: Insufficient documentation

## 2020-03-20 DIAGNOSIS — Z7982 Long term (current) use of aspirin: Secondary | ICD-10-CM | POA: Insufficient documentation

## 2020-03-20 DIAGNOSIS — G8929 Other chronic pain: Secondary | ICD-10-CM | POA: Insufficient documentation

## 2020-03-20 NOTE — Telephone Encounter (Signed)
CSW called [facility]   *see previous note

## 2020-03-20 NOTE — ED Provider Notes (Signed)
[facility] EMERGENCY DEPARTMENT Provider Note   CSN: [id number] Arrival date & time: 03/19/20  1941     History Chief Complaint  Patient presents with  . Hypertension    [name] is a 69 y.o. male.  The history is provided by the patient and medical records. No language interpreter was used.  Hypertension This is a new problem. The problem occurs constantly. The problem has not changed since onset.Pertinent negatives include no chest pain, no abdominal pain, no headaches and no shortness of breath. Nothing aggravates the symptoms. Nothing relieves the symptoms. He has tried nothing for the symptoms. The treatment provided no relief.       Past Medical History:  Diagnosis Date  . BPH (benign prostatic hyperplasia)   . Colon polyp 2010  . Depression   . GERD (gastroesophageal reflux disease)   . Hepatitis C    "caught it when I had a blood transfusion"  . High cholesterol   . History of blood transfusion    "when I was young"  . Hypertension   . Paranoid schizophrenia ([place])   . Prostate atrophy   . Retinal vein occlusion     Patient Active Problem List   Diagnosis Date Noted  . GERD (gastroesophageal reflux disease) 05/03/2019  . Hypotension 08/02/2018  . Low back sprain, initial encounter 10/21/2017  . History of drug abuse ([place]) 10/18/2017  . Gingival erythema 09/23/2017  . Chronic pain of left knee 11/24/2016  . Tobacco abuse   . Diastolic heart failure ([place]) 02/05/2015  . Healthcare maintenance 05/28/2014  . Hyperlipidemia 03/10/2014  . Patellar tendonitis 11/16/2012  . [place] OCCLUSION 06/03/2010  . PARANOID SCHIZOPHRENIA, CHRONIC 01/17/2009  . HYPERTENSION, BENIGN ESSENTIAL 01/17/2009  . BPH (benign prostatic hyperplasia) 01/17/2009  . HEPATITIS C 12/14/2008  . DEPRESSION 12/14/2008    Past Surgical History:  Procedure Laterality Date  . CIRCUMCISION  1959  . TONSILLECTOMY         Family History  Problem  Relation Age of Onset  . Hypertension Mother   . Diabetes Mother   . Stroke Mother     Social History   Tobacco Use  . Smoking status: Current Every Day Smoker    Packs/day: 0.50    Years: 48.00    Pack years: 24.00    Types: Cigarettes  . Smokeless tobacco: Never Used  Substance Use Topics  . Alcohol use: Yes  . Drug use: No    Home Medications Prior to Admission medications   Medication Sig Start Date End Date Taking? Authorizing Provider  acetaminophen (TYLENOL) 325 MG tablet Take 2 tablets (650 mg total) by mouth every 6 (six) hours as needed for up to 5 days. Do not take more than 4000mg  of tylenol per day 03/17/20 03/22/20  Couture, Cortni S, PA-C  amLODipine (NORVASC) 10 MG tablet Take 1 tablet (10 mg total) by mouth daily. 04/13/18   [name], DO  aspirin 81 MG tablet Take 1 tablet (81 mg total) by mouth daily. 09/24/14   [name], MD  atorvastatin (LIPITOR) 40 MG tablet Take 1 tablet (40 mg total) by mouth daily. 08/15/19   [name], DO  benztropine (COGENTIN) 1 MG tablet Take 1 tablet (1 mg total) by mouth 2 (two) times daily. 02/26/20   Salley Slaughter, NP  cetirizine (ZYRTEC ALLERGY) 10 MG tablet Take 1 tablet (10 mg total) by mouth daily. 02/01/20   [name], [name], PA-C  famotidine (PEPCID) 20  MG tablet Take 1 tablet (20 mg total) by mouth 2 (two) times daily. 05/02/19   [name], DO  fluPHENAZine decanoate (PROLIXIN) 25 MG/ML injection Inject 1 mL (25 mg total) into the muscle every 14 (fourteen) days. Last dose 09/07/12 02/26/20   Salley Slaughter, NP  fluticasone (FLONASE) 50 MCG/ACT nasal spray Place 2 sprays into both nostrils daily. 03/17/20   Couture, Cortni S, PA-C  hydrOXYzine (VISTARIL) 50 MG capsule Take 50 mg by mouth at bedtime. 02/01/20   [provider]  losartan (COZAAR) 100 MG tablet Take 1 tablet (100 mg total) by mouth daily. 11/28/19   Tammi Klippel, Sherin, DO  mometasone (NASONEX) 50 MCG/ACT nasal spray Place 2 sprays into  the nose daily. 12/29/19   [name], PA-C  Multiple Vitamin (MULTIVITAMIN WITH MINERALS) TABS tablet Take 1 tablet by mouth daily. 06/28/14   [name], MD  sodium chloride (OCEAN) 0.65 % SOLN nasal spray Place 1 spray into both nostrils as needed for congestion. 11/19/19   [name], PA-C  tamsulosin (FLOMAX) 0.4 MG CAPS capsule TAKE 2 CAPSULE BY MOUTH DAILY Patient taking differently: Take 0.8 mg by mouth daily.  02/23/20   Mullis, Kiersten P, DO  traMADol (ULTRAM) 50 MG tablet Take 1 tablet (50 mg total) by mouth every 6 (six) hours as needed. 07/04/18   [name], MD  triamcinolone cream (KENALOG) 0.1 % Apply 1 application topically 2 (two) times daily. For 5 days, then twice daily as needed for rash and itching. 01/03/15   [name], MD  vitamin C (ASCORBIC ACID) 500 MG tablet Take 500 mg by mouth daily.    [provider]  loratadine (CLARITIN) 10 MG tablet Take 1 tablet (10 mg total) by mouth daily. 12/28/19 02/05/20  [name], DO    Allergies    Lisinopril, Penicillins, Amoxicillin, and Ibuprofen  Review of Systems   Review of Systems  Constitutional: Negative for chills, fatigue and fever.  HENT: Negative for congestion.   Eyes: Negative for visual disturbance.  Respiratory: Negative for cough, chest tightness, shortness of breath and wheezing.   Cardiovascular: Negative for chest pain.  Gastrointestinal: Negative for abdominal pain, constipation, diarrhea, nausea and vomiting.  Genitourinary: Negative for flank pain.  Musculoskeletal: Negative for back pain.  Skin: Negative for rash and wound.  Neurological: Negative for light-headedness and headaches.  Psychiatric/Behavioral: Negative for agitation and confusion.  All other systems reviewed and are negative.   Physical Exam Updated Vital Signs BP (!) 150/94   Pulse (!) 58   Temp 98.7 F (37.1 C) (Oral)   Resp 18   Ht 5\' 9"  (1.753 m)   Wt 67.1 kg   SpO2 100%   BMI 21.85 kg/m     Physical Exam Vitals and nursing note reviewed.  Constitutional:      General: He is not in acute distress.    Appearance: He is well-developed. He is not ill-appearing, toxic-appearing or diaphoretic.  HENT:     Head: Normocephalic and atraumatic.     Nose: No congestion or rhinorrhea.     Mouth/Throat:     Mouth: Mucous membranes are moist.     Pharynx: No oropharyngeal exudate or posterior oropharyngeal erythema.  Eyes:     Extraocular Movements: Extraocular movements intact.     Conjunctiva/sclera: Conjunctivae normal.     Pupils: Pupils are equal, round, and reactive to light.  Cardiovascular:     Rate and Rhythm: Normal rate and regular rhythm.  Pulses: Normal pulses.     Heart sounds: No murmur heard.   Pulmonary:     Effort: Pulmonary effort is normal. No respiratory distress.     Breath sounds: Normal breath sounds. No wheezing, rhonchi or rales.  Chest:     Chest wall: No tenderness.  Abdominal:     General: Abdomen is flat.     Palpations: Abdomen is soft.     Tenderness: There is no abdominal tenderness. There is no right CVA tenderness, left CVA tenderness, guarding or rebound.  Musculoskeletal:        General: No tenderness.     Cervical back: Neck supple. No tenderness.  Skin:    General: Skin is warm and dry.     Capillary Refill: Capillary refill takes less than 2 seconds.     Findings: No erythema.  Neurological:     General: No focal deficit present.     Mental Status: He is alert.     Sensory: No sensory deficit.     Motor: No weakness.     Gait: Gait normal.  Psychiatric:        Mood and Affect: Mood normal.     ED Results / Procedures / Treatments   Labs (all labs ordered are listed, but only abnormal results are displayed) Labs Reviewed - No data to display  EKG None  Radiology No results found.  Procedures Procedures (including critical care time)  Medications Ordered in ED Medications - No data to display  ED Course  I  have reviewed the triage vital signs and the nursing notes.  Pertinent labs & imaging results that were available during my care of the patient were reviewed by me and considered in my medical decision making (see chart for details).    MDM Rules/Calculators/A&P                          [name] is a 69 y.o. male with a past medical history significant for paranoid schizophrenia, hypertension, hyperlipidemia, CHF, GERD, hepatitis C, and substance abuse who presents for blood pressure concerns.  He checked in approximately 13 hours and 45 minutes prior to my evaluation.  He says that he has no complaints but he was here for his significant other who was getting checked out for other problems.  He says he wanted to make sure he thing was okay as his blood pressure was slightly elevated.  On my evaluation, his blood pressure is around 654 systolic to 650 systolic.  He is reporting no complaints whatsoever.  He specifically denies any fevers, chills, chest pain, shortness breath, palpitations, nausea, vomiting, urinary symptoms or GI symptoms.  He is feeling totally normal and resting comfortably.  When I asked him if he had any questions or concerns he says no he just wants to make sure his family is okay.  On exam, his lungs were clear and chest was nontender.  Abdomen was nontender.  He is sitting comfortably without distress.  He ambulated without difficulty.  Given his lack of any complaints and any abnormalities on initial exam, and his blood pressure being reassuring which was the initial cause for him to check in, do not feel he needs extensive work-up at this time.  He understands return precautions and follow-up instructions.  Patient discharged in good condition after reassuring evaluation.   Final Clinical Impression(s) / ED Diagnoses Final diagnoses:  Elevated blood pressure reading    Rx / DC  Orders ED Discharge Orders    None      Clinical Impression: 1. Elevated blood  pressure reading     Disposition: Discharge  Condition: Good  I have discussed the results, Dx and Tx plan with the pt(& family if present). He/she/they expressed understanding and agree(s) with the plan. Discharge instructions discussed at great length. Strict return precautions discussed and pt &/or family have verbalized understanding of the instructions. No further questions at time of discharge.    New Prescriptions   No medications on file    Follow Up: [facility] [address] [place] [address] [phone] Schedule an appointment as soon as possible for a visit    [facility] [address] [id number] mc [place]       Creig Landin, Gwenyth Allegra, MD 03/20/20 [phone]

## 2020-03-20 NOTE — ED Triage Notes (Signed)
Pt c/o chronic knee pain. Seen 107 time in last 36m for same. Pt ambulatory in lobby

## 2020-03-20 NOTE — ED Notes (Signed)
Pt asleep and refusing vitals

## 2020-03-20 NOTE — ED Notes (Signed)
Called pt x2 for vitals, no response. °

## 2020-03-20 NOTE — Social Work (Signed)
CSW called [facility], Pt's managed Medicaid provider @ [phone] and left message requesting a callback from case manager assigned to Pt in an effort to coordinate care.

## 2020-03-20 NOTE — Discharge Instructions (Signed)
During our discussion today, he did not report any complaints that you are concerned about.  Your exam was reassuring.  As you are check-in concern was for blood pressure which does appear well at this time, we feel you are safe for discharge home without extensive work-up.  Please follow-up with your primary doctor and take your home medications.  If any symptoms change or worsen, please return to the nearest emergency department.

## 2020-03-21 ENCOUNTER — Ambulatory Visit (HOSPITAL_COMMUNITY)
Admission: EM | Admit: 2020-03-21 | Discharge: 2020-03-21 | Disposition: A | Discharge status: Home / Self Care | Payer: Medicare Other

## 2020-03-21 ENCOUNTER — Other Ambulatory Visit: Payer: Self-pay

## 2020-03-21 NOTE — ED Provider Notes (Signed)
[facility] EMERGENCY DEPARTMENT Provider Note   CSN: [id number] Arrival date & time: 03/20/20  1951     History Chief Complaint  Patient presents with  . Knee Pain    [name] is a 69 y.o. male.  The history is provided by the patient and medical records.  Knee Pain   69 year old male with history of BPH, depression, GERD, hepatitis, hyperlipidemia, hypertension, paranoid schizophrenia, presenting to the ED with knee pain.  This is a chronic issue for him, well-known to this facility with 108 visits in the past 6 months for similar related complaints.  He states he feels fine on my exam.  No complaints currently, states he wants something to drink.  Past Medical History:  Diagnosis Date  . BPH (benign prostatic hyperplasia)   . Colon polyp 2010  . Depression   . GERD (gastroesophageal reflux disease)   . Hepatitis C    "caught it when I had a blood transfusion"  . High cholesterol   . History of blood transfusion    "when I was young"  . Hypertension   . Paranoid schizophrenia ([place])   . Prostate atrophy   . Retinal vein occlusion     Patient Active Problem List   Diagnosis Date Noted  . GERD (gastroesophageal reflux disease) 05/03/2019  . Hypotension 08/02/2018  . Low back sprain, initial encounter 10/21/2017  . History of drug abuse ([place]) 10/18/2017  . Gingival erythema 09/23/2017  . Chronic pain of left knee 11/24/2016  . Tobacco abuse   . Diastolic heart failure ([place]) 02/05/2015  . Healthcare maintenance 05/28/2014  . Hyperlipidemia 03/10/2014  . Patellar tendonitis 11/16/2012  . [place] OCCLUSION 06/03/2010  . PARANOID SCHIZOPHRENIA, CHRONIC 01/17/2009  . HYPERTENSION, BENIGN ESSENTIAL 01/17/2009  . BPH (benign prostatic hyperplasia) 01/17/2009  . HEPATITIS C 12/14/2008  . DEPRESSION 12/14/2008    Past Surgical History:  Procedure Laterality Date  . CIRCUMCISION  1959  . TONSILLECTOMY         Family History    Problem Relation Age of Onset  . Hypertension Mother   . Diabetes Mother   . Stroke Mother     Social History   Tobacco Use  . Smoking status: Current Every Day Smoker    Packs/day: 0.50    Years: 48.00    Pack years: 24.00    Types: Cigarettes  . Smokeless tobacco: Never Used  Substance Use Topics  . Alcohol use: Yes  . Drug use: No    Home Medications Prior to Admission medications   Medication Sig Start Date End Date Taking? Authorizing Provider  acetaminophen (TYLENOL) 325 MG tablet Take 2 tablets (650 mg total) by mouth every 6 (six) hours as needed for up to 5 days. Do not take more than 4000mg  of tylenol per day 03/17/20 03/22/20  Couture, Cortni S, PA-C  amLODipine (NORVASC) 10 MG tablet Take 1 tablet (10 mg total) by mouth daily. 04/13/18   [name], DO  aspirin 81 MG tablet Take 1 tablet (81 mg total) by mouth daily. 09/24/14   [name], MD  atorvastatin (LIPITOR) 40 MG tablet Take 1 tablet (40 mg total) by mouth daily. 08/15/19   [name], DO  benztropine (COGENTIN) 1 MG tablet Take 1 tablet (1 mg total) by mouth 2 (two) times daily. 02/26/20   Salley Slaughter, NP  cetirizine (ZYRTEC ALLERGY) 10 MG tablet Take 1 tablet (10 mg total) by mouth daily. 02/01/20   Nils Flack,  [name], PA-C  famotidine (PEPCID) 20 MG tablet Take 1 tablet (20 mg total) by mouth 2 (two) times daily. 05/02/19   [name], DO  fluPHENAZine decanoate (PROLIXIN) 25 MG/ML injection Inject 1 mL (25 mg total) into the muscle every 14 (fourteen) days. Last dose 09/07/12 02/26/20   Salley Slaughter, NP  fluticasone (FLONASE) 50 MCG/ACT nasal spray Place 2 sprays into both nostrils daily. 03/17/20   Couture, Cortni S, PA-C  hydrOXYzine (VISTARIL) 50 MG capsule Take 50 mg by mouth at bedtime. 02/01/20   [provider]  losartan (COZAAR) 100 MG tablet Take 1 tablet (100 mg total) by mouth daily. 11/28/19   Tammi Klippel, Sherin, DO  mometasone (NASONEX) 50 MCG/ACT nasal spray Place 2  sprays into the nose daily. 12/29/19   [name], PA-C  Multiple Vitamin (MULTIVITAMIN WITH MINERALS) TABS tablet Take 1 tablet by mouth daily. 06/28/14   [name], MD  sodium chloride (OCEAN) 0.65 % SOLN nasal spray Place 1 spray into both nostrils as needed for congestion. 11/19/19   [name], PA-C  tamsulosin (FLOMAX) 0.4 MG CAPS capsule TAKE 2 CAPSULE BY MOUTH DAILY Patient taking differently: Take 0.8 mg by mouth daily.  02/23/20   Mullis, Kiersten P, DO  traMADol (ULTRAM) 50 MG tablet Take 1 tablet (50 mg total) by mouth every 6 (six) hours as needed. 07/04/18   [name], MD  triamcinolone cream (KENALOG) 0.1 % Apply 1 application topically 2 (two) times daily. For 5 days, then twice daily as needed for rash and itching. 01/03/15   [name], MD  vitamin C (ASCORBIC ACID) 500 MG tablet Take 500 mg by mouth daily.    [provider]  loratadine (CLARITIN) 10 MG tablet Take 1 tablet (10 mg total) by mouth daily. 12/28/19 02/05/20  [name], DO    Allergies    Lisinopril, Penicillins, Amoxicillin, and Ibuprofen  Review of Systems   Review of Systems  Musculoskeletal: Positive for arthralgias.  All other systems reviewed and are negative.   Physical Exam Updated Vital Signs BP (!) 153/77 (BP Location: Right Arm)   Pulse 61   Temp 98.6 F (37 C) (Oral)   Resp 20   SpO2 96%   Physical Exam Vitals and nursing note reviewed.  Constitutional:      Appearance: He is well-developed.     Comments: Sleeping in chair, NAD  HENT:     Head: Normocephalic and atraumatic.  Eyes:     Conjunctiva/sclera: Conjunctivae normal.     Pupils: Pupils are equal, round, and reactive to light.  Cardiovascular:     Rate and Rhythm: Normal rate and regular rhythm.     Heart sounds: Normal heart sounds.  Pulmonary:     Effort: Pulmonary effort is normal. No respiratory distress.     Breath sounds: Normal breath sounds. No rhonchi.  Abdominal:     General:  Bowel sounds are normal.     Palpations: Abdomen is soft.     Tenderness: There is no abdominal tenderness. There is no rebound.  Musculoskeletal:        General: Normal range of motion.     Cervical back: Normal range of motion.     Comments: Knees atraumatic, no swelling or induration noted, ambulatory  Skin:    General: Skin is warm and dry.  Neurological:     Mental Status: He is alert and oriented to person, place, and time.     ED Results /  Procedures / Treatments   Labs (all labs ordered are listed, but only abnormal results are displayed) Labs Reviewed - No data to display  EKG None  Radiology No results found.  Procedures Procedures (including critical care time)  Medications Ordered in ED Medications - No data to display  ED Course  I have reviewed the triage vital signs and the nursing notes.  Pertinent labs & imaging results that were available during my care of the patient were reviewed by me and considered in my medical decision making (see chart for details).    MDM Rules/Calculators/A&P  69 y.o. M here with knee pain.  This is a chronic issue for him, 108 visits in the past 6 months for similar complaints.  Knees atraumatic on my exam, no swelling or induration to suggest septic joint.  He remains ambulatory.  No indication for emergent imaging.  Stable for discharge.  Can return here for any new/acute changes.  Final Clinical Impression(s) / ED Diagnoses Final diagnoses:  Knee pain, unspecified chronicity, unspecified laterality    Rx / DC Orders ED Discharge Orders    None       [name], PA-C 03/21/20 0116    Mesner, Corene Cornea, MD 03/21/20 0230

## 2020-03-21 NOTE — ED Notes (Signed)
Discharge instructions discussed with pt. Pt verbalized understanding. Pt stable and ambulatory. No signature pad available. 

## 2020-03-22 ENCOUNTER — Emergency Department (HOSPITAL_COMMUNITY)
Admission: EM | Admit: 2020-03-22 | Discharge: 2020-03-22 | Disposition: A | Discharge status: Home / Self Care | Payer: Medicare Other | Attending: Emergency Medicine | Admitting: Emergency Medicine

## 2020-03-22 ENCOUNTER — Other Ambulatory Visit: Payer: Self-pay

## 2020-03-22 DIAGNOSIS — M25569 Pain in unspecified knee: Secondary | ICD-10-CM | POA: Insufficient documentation

## 2020-03-22 DIAGNOSIS — Z5321 Procedure and treatment not carried out due to patient leaving prior to being seen by health care provider: Secondary | ICD-10-CM | POA: Insufficient documentation

## 2020-03-22 DIAGNOSIS — R0981 Nasal congestion: Secondary | ICD-10-CM | POA: Diagnosis not present

## 2020-03-22 NOTE — ED Notes (Signed)
Pt called for room by Ida(NT), no response.

## 2020-03-22 NOTE — ED Triage Notes (Signed)
Pt presents to ED POV. Pt c/o congestion and knee pain. Pt seen everyday for similar complaints

## 2020-03-23 ENCOUNTER — Encounter (HOSPITAL_COMMUNITY): Payer: Self-pay

## 2020-03-23 ENCOUNTER — Other Ambulatory Visit: Payer: Self-pay

## 2020-03-23 ENCOUNTER — Encounter (HOSPITAL_COMMUNITY): Payer: Self-pay | Admitting: Emergency Medicine

## 2020-03-23 ENCOUNTER — Emergency Department (HOSPITAL_COMMUNITY)
Admission: EM | Admit: 2020-03-23 | Discharge: 2020-03-23 | Disposition: A | Discharge status: Home / Self Care | Payer: Medicare Other | Attending: Emergency Medicine | Admitting: Emergency Medicine

## 2020-03-23 ENCOUNTER — Emergency Department (HOSPITAL_COMMUNITY)
Admission: EM | Admit: 2020-03-23 | Discharge: 2020-03-24 | Disposition: A | Discharge status: Home / Self Care | Payer: Medicare Other | Source: Home / Self Care | Attending: Emergency Medicine | Admitting: Emergency Medicine

## 2020-03-23 DIAGNOSIS — I5030 Unspecified diastolic (congestive) heart failure: Secondary | ICD-10-CM | POA: Insufficient documentation

## 2020-03-23 DIAGNOSIS — M25561 Pain in right knee: Secondary | ICD-10-CM | POA: Insufficient documentation

## 2020-03-23 DIAGNOSIS — G8929 Other chronic pain: Secondary | ICD-10-CM | POA: Diagnosis not present

## 2020-03-23 DIAGNOSIS — M25562 Pain in left knee: Secondary | ICD-10-CM | POA: Insufficient documentation

## 2020-03-23 DIAGNOSIS — I10 Essential (primary) hypertension: Secondary | ICD-10-CM | POA: Diagnosis not present

## 2020-03-23 DIAGNOSIS — R0981 Nasal congestion: Secondary | ICD-10-CM | POA: Insufficient documentation

## 2020-03-23 DIAGNOSIS — Z79899 Other long term (current) drug therapy: Secondary | ICD-10-CM | POA: Insufficient documentation

## 2020-03-23 DIAGNOSIS — Z7982 Long term (current) use of aspirin: Secondary | ICD-10-CM | POA: Insufficient documentation

## 2020-03-23 DIAGNOSIS — F17210 Nicotine dependence, cigarettes, uncomplicated: Secondary | ICD-10-CM | POA: Insufficient documentation

## 2020-03-23 DIAGNOSIS — I110 Hypertensive heart disease with heart failure: Secondary | ICD-10-CM | POA: Insufficient documentation

## 2020-03-23 NOTE — ED Triage Notes (Signed)
C/o of everyday sinus pain

## 2020-03-23 NOTE — Discharge Instructions (Addendum)
Ibuprofen or acetaminophen for pain

## 2020-03-23 NOTE — ED Triage Notes (Signed)
Pt c/o sinus and knee problems.

## 2020-03-23 NOTE — ED Provider Notes (Signed)
[name] Provider Note   CSN: [id number] Arrival date & time: 03/23/20  0018     History Chief Complaint  Patient presents with  . Sinus Problems    [name] is a 69 y.o. male presents to ER for evaluation of "my knees".  Patient states he wants a wheelchair.  Denies any falls. This is his 6th ED visit in the last 7 days. Patient with frequent ED visits for knee pain. 111 visits to the ED in the last 6 months.   HPI     Past Medical History:  Diagnosis Date  . BPH (benign prostatic hyperplasia)   . Colon polyp 2010  . Depression   . GERD (gastroesophageal reflux disease)   . Hepatitis C    "caught it when I had a blood transfusion"  . High cholesterol   . History of blood transfusion    "when I was young"  . Hypertension   . Paranoid schizophrenia ([place])   . Prostate atrophy   . Retinal vein occlusion     Patient Active Problem List   Diagnosis Date Noted  . GERD (gastroesophageal reflux disease) 05/03/2019  . Hypotension 08/02/2018  . Low back sprain, initial encounter 10/21/2017  . History of drug abuse ([place]) 10/18/2017  . Gingival erythema 09/23/2017  . Chronic pain of left knee 11/24/2016  . Tobacco abuse   . Diastolic heart failure ([place]) 02/05/2015  . Healthcare maintenance 05/28/2014  . Hyperlipidemia 03/10/2014  . Patellar tendonitis 11/16/2012  . [place] OCCLUSION 06/03/2010  . PARANOID SCHIZOPHRENIA, CHRONIC 01/17/2009  . HYPERTENSION, BENIGN ESSENTIAL 01/17/2009  . BPH (benign prostatic hyperplasia) 01/17/2009  . HEPATITIS C 12/14/2008  . DEPRESSION 12/14/2008    Past Surgical History:  Procedure Laterality Date  . CIRCUMCISION  1959  . TONSILLECTOMY         Family History  Problem Relation Age of Onset  . Hypertension Mother   . Diabetes Mother   . Stroke Mother     Social History   Tobacco Use  . Smoking status: Current Every Day Smoker    Packs/day: 0.50    Years: 48.00      Pack years: 24.00    Types: Cigarettes  . Smokeless tobacco: Never Used  Substance Use Topics  . Alcohol use: Yes  . Drug use: No    Home Medications Prior to Admission medications   Medication Sig Start Date End Date Taking? Authorizing Provider  amLODipine (NORVASC) 10 MG tablet Take 1 tablet (10 mg total) by mouth daily. 04/13/18   [name], DO  aspirin 81 MG tablet Take 1 tablet (81 mg total) by mouth daily. 09/24/14   [name], MD  atorvastatin (LIPITOR) 40 MG tablet Take 1 tablet (40 mg total) by mouth daily. 08/15/19   [name], DO  benztropine (COGENTIN) 1 MG tablet Take 1 tablet (1 mg total) by mouth 2 (two) times daily. 02/26/20   Salley Slaughter, NP  cetirizine (ZYRTEC ALLERGY) 10 MG tablet Take 1 tablet (10 mg total) by mouth daily. 02/01/20   [name], [name], PA-C  famotidine (PEPCID) 20 MG tablet Take 1 tablet (20 mg total) by mouth 2 (two) times daily. 05/02/19   [name], DO  fluPHENAZine decanoate (PROLIXIN) 25 MG/ML injection Inject 1 mL (25 mg total) into the muscle every 14 (fourteen) days. Last dose 09/07/12 02/26/20   Salley Slaughter, NP  fluticasone (FLONASE) 50 MCG/ACT nasal spray Place 2 sprays  into both nostrils daily. 03/17/20   Couture, Cortni S, PA-C  hydrOXYzine (VISTARIL) 50 MG capsule Take 50 mg by mouth at bedtime. 02/01/20   [provider]  losartan (COZAAR) 100 MG tablet Take 1 tablet (100 mg total) by mouth daily. 11/28/19   Tammi Klippel, Sherin, DO  mometasone (NASONEX) 50 MCG/ACT nasal spray Place 2 sprays into the nose daily. 12/29/19   [name], PA-C  Multiple Vitamin (MULTIVITAMIN WITH MINERALS) TABS tablet Take 1 tablet by mouth daily. 06/28/14   [name], MD  sodium chloride (OCEAN) 0.65 % SOLN nasal spray Place 1 spray into both nostrils as needed for congestion. 11/19/19   [name], PA-C  tamsulosin (FLOMAX) 0.4 MG CAPS capsule TAKE 2 CAPSULE BY MOUTH DAILY Patient taking differently:  Take 0.8 mg by mouth daily.  02/23/20   Mullis, Kiersten P, DO  traMADol (ULTRAM) 50 MG tablet Take 1 tablet (50 mg total) by mouth every 6 (six) hours as needed. 07/04/18   [name], MD  triamcinolone cream (KENALOG) 0.1 % Apply 1 application topically 2 (two) times daily. For 5 days, then twice daily as needed for rash and itching. 01/03/15   [name], MD  vitamin C (ASCORBIC ACID) 500 MG tablet Take 500 mg by mouth daily.    [provider]  loratadine (CLARITIN) 10 MG tablet Take 1 tablet (10 mg total) by mouth daily. 12/28/19 02/05/20  [name], DO    Allergies    Lisinopril, Penicillins, Amoxicillin, and Ibuprofen  Review of Systems   Review of Systems  Musculoskeletal: Positive for arthralgias.  All other systems reviewed and are negative.   Physical Exam Updated Vital Signs BP (!) 187/103   Pulse (!) 57   Temp 97.7 F (36.5 C) (Oral)   Resp 16   SpO2 100%   Physical Exam Constitutional:      Appearance: He is well-developed.  HENT:     Head: Normocephalic.     Nose: Nose normal.  Eyes:     General: Lids are normal.  Cardiovascular:     Rate and Rhythm: Normal rate.     Comments: Lower extremities warm  Pulmonary:     Effort: Pulmonary effort is normal. No respiratory distress.  Musculoskeletal:        General: Normal range of motion.     Cervical back: Normal range of motion.     Comments: Examination of both knees reveals no focal or asymmetric edema, erythema, warmth, fluctuance.  Patient actually asleep when I begin my exam, woken up for exam.  Eyes closed during flexion/extension of knees. No crepitus. Normal J tracking of patella. No calf tenderness or edema   Neurological:     Mental Status: He is alert.     Comments: Sensation to light touch intact in both lower legs   Psychiatric:        Behavior: Behavior normal.     ED Results / Procedures / Treatments   Labs (all labs ordered are listed, but only abnormal results are  displayed) Labs Reviewed - No data to display  EKG None  Radiology No results found.  Procedures Procedures (including critical care time)  Medications Ordered in ED Medications - No data to display  ED Course  I have reviewed the triage vital signs and the nursing notes.  Pertinent labs & imaging results that were available during my care of the patient were reviewed by me and considered in my medical decision making (see chart  for details).    MDM Rules/Calculators/A&P                          Patient with several ED visits for knee pain. Seen yesterday for same.  Exam benign. He is requesting wheelchair.  Last imaging earlier this year. Febrile. He denies recent falls. I don't think further emergent work up/imaging necessary. Will discharge.   Final Clinical Impression(s) / ED Diagnoses Final diagnoses:  Chronic pain of both knees    Rx / DC Orders ED Discharge Orders    None       Kinnie Feil, PA-C 03/23/20 7943    Ripley Fraise, MD 03/23/20 [phone]

## 2020-03-23 NOTE — ED Notes (Signed)
Patient given discharge instructions. Questions were answered. Patient verbalized understanding of discharge instructions and care at home.  

## 2020-03-24 NOTE — ED Notes (Signed)
Pt verbalized understanding of discharge instructions, discharged from ED in NAD

## 2020-03-24 NOTE — ED Provider Notes (Signed)
[facility] EMERGENCY DEPARTMENT Provider Note   CSN: [id number] Arrival date & time: 03/23/20  2159     History Chief Complaint  Patient presents with  . Facial Pain    [name] is a 69 y.o. male.  HPI   Patient presents to the emergency department with chief complaint of nasal congestion.  Patient states he has had nasal congestion for the last 2 days denies any alleviating or aggravating factors.  He denies headache, fever, chills, sore throat, ear pain, cough, shortness of breath.  He is well-known to the ED, was seen and discharged yesterday for knee pain.  He has significant medical history of BPH, depression, acid reflux, hep C.  Patient denies taking any medication on daily basis.  Patient denies headache, fever, chills, shortness of breath, chest pain, abdominal pain, nausea, vomiting, diarrhea, pedal edema.  Past Medical History:  Diagnosis Date  . BPH (benign prostatic hyperplasia)   . Colon polyp 2010  . Depression   . GERD (gastroesophageal reflux disease)   . Hepatitis C    "caught it when I had a blood transfusion"  . High cholesterol   . History of blood transfusion    "when I was young"  . Hypertension   . Paranoid schizophrenia ([place])   . Prostate atrophy   . Retinal vein occlusion     Patient Active Problem List   Diagnosis Date Noted  . GERD (gastroesophageal reflux disease) 05/03/2019  . Hypotension 08/02/2018  . Low back sprain, initial encounter 10/21/2017  . History of drug abuse ([place]) 10/18/2017  . Gingival erythema 09/23/2017  . Chronic pain of left knee 11/24/2016  . Tobacco abuse   . Diastolic heart failure ([place]) 02/05/2015  . Healthcare maintenance 05/28/2014  . Hyperlipidemia 03/10/2014  . Patellar tendonitis 11/16/2012  . [place] OCCLUSION 06/03/2010  . PARANOID SCHIZOPHRENIA, CHRONIC 01/17/2009  . HYPERTENSION, BENIGN ESSENTIAL 01/17/2009  . BPH (benign prostatic hyperplasia) 01/17/2009  . HEPATITIS  C 12/14/2008  . DEPRESSION 12/14/2008    Past Surgical History:  Procedure Laterality Date  . CIRCUMCISION  1959  . TONSILLECTOMY         Family History  Problem Relation Age of Onset  . Hypertension Mother   . Diabetes Mother   . Stroke Mother     Social History   Tobacco Use  . Smoking status: Current Every Day Smoker    Packs/day: 0.50    Years: 48.00    Pack years: 24.00    Types: Cigarettes  . Smokeless tobacco: Never Used  Substance Use Topics  . Alcohol use: Yes  . Drug use: No    Home Medications Prior to Admission medications   Medication Sig Start Date End Date Taking? Authorizing Provider  amLODipine (NORVASC) 10 MG tablet Take 1 tablet (10 mg total) by mouth daily. 04/13/18   [name], DO  aspirin 81 MG tablet Take 1 tablet (81 mg total) by mouth daily. 09/24/14   [name], MD  atorvastatin (LIPITOR) 40 MG tablet Take 1 tablet (40 mg total) by mouth daily. 08/15/19   [name], DO  benztropine (COGENTIN) 1 MG tablet Take 1 tablet (1 mg total) by mouth 2 (two) times daily. 02/26/20   Salley Slaughter, NP  cetirizine (ZYRTEC ALLERGY) 10 MG tablet Take 1 tablet (10 mg total) by mouth daily. 02/01/20   [name], [name], PA-C  famotidine (PEPCID) 20 MG tablet Take 1 tablet (20 mg total) by mouth 2 (  two) times daily. 05/02/19   [name], DO  fluPHENAZine decanoate (PROLIXIN) 25 MG/ML injection Inject 1 mL (25 mg total) into the muscle every 14 (fourteen) days. Last dose 09/07/12 02/26/20   Salley Slaughter, NP  fluticasone (FLONASE) 50 MCG/ACT nasal spray Place 2 sprays into both nostrils daily. 03/17/20   Couture, Cortni S, PA-C  hydrOXYzine (VISTARIL) 50 MG capsule Take 50 mg by mouth at bedtime. 02/01/20   [provider]  losartan (COZAAR) 100 MG tablet Take 1 tablet (100 mg total) by mouth daily. 11/28/19   Tammi Klippel, Sherin, DO  mometasone (NASONEX) 50 MCG/ACT nasal spray Place 2 sprays into the nose daily. 12/29/19   [name], PA-C  Multiple Vitamin (MULTIVITAMIN WITH MINERALS) TABS tablet Take 1 tablet by mouth daily. 06/28/14   [name], MD  sodium chloride (OCEAN) 0.65 % SOLN nasal spray Place 1 spray into both nostrils as needed for congestion. 11/19/19   [name], PA-C  tamsulosin (FLOMAX) 0.4 MG CAPS capsule TAKE 2 CAPSULE BY MOUTH DAILY Patient taking differently: Take 0.8 mg by mouth daily.  02/23/20   Mullis, Kiersten P, DO  traMADol (ULTRAM) 50 MG tablet Take 1 tablet (50 mg total) by mouth every 6 (six) hours as needed. 07/04/18   [name], MD  triamcinolone cream (KENALOG) 0.1 % Apply 1 application topically 2 (two) times daily. For 5 days, then twice daily as needed for rash and itching. 01/03/15   [name], MD  vitamin C (ASCORBIC ACID) 500 MG tablet Take 500 mg by mouth daily.    [provider]  loratadine (CLARITIN) 10 MG tablet Take 1 tablet (10 mg total) by mouth daily. 12/28/19 02/05/20  [name], DO    Allergies    Lisinopril, Penicillins, Amoxicillin, and Ibuprofen  Review of Systems   Review of Systems  Constitutional: Negative for chills and fever.  HENT: Positive for rhinorrhea. Negative for congestion, facial swelling, hearing loss, mouth sores, sinus pain and sore throat.   Eyes: Negative for visual disturbance.  Respiratory: Negative for shortness of breath.   Cardiovascular: Negative for chest pain.  Gastrointestinal: Negative for abdominal pain, diarrhea, nausea and vomiting.  Genitourinary: Negative for enuresis.  Musculoskeletal: Negative for back pain.  Skin: Negative for rash.  Neurological: Negative for dizziness and headaches.  Hematological: Does not bruise/bleed easily.    Physical Exam Updated Vital Signs BP (!) 175/75   Pulse 67   Temp (!) 97.5 F (36.4 C)   Resp 16   SpO2 99%   Physical Exam Vitals and nursing note reviewed.  Constitutional:      General: He is not in acute distress.    Appearance: He is not  ill-appearing.  HENT:     Head: Normocephalic and atraumatic.     Right Ear: Tympanic membrane, ear canal and external ear normal. There is no impacted cerumen.     Left Ear: Tympanic membrane, ear canal and external ear normal. There is no impacted cerumen.     Nose: Congestion present.     Mouth/Throat:     Mouth: Mucous membranes are moist.     Pharynx: Oropharynx is clear. No oropharyngeal exudate or posterior oropharyngeal erythema.  Eyes:     General: No scleral icterus. Cardiovascular:     Rate and Rhythm: Normal rate and regular rhythm.     Pulses: Normal pulses.     Heart sounds: No murmur heard.  No friction rub. No gallop.   Pulmonary:  Effort: No respiratory distress.     Breath sounds: No wheezing, rhonchi or rales.  Abdominal:     General: There is no distension.     Tenderness: There is no abdominal tenderness. There is no guarding.  Musculoskeletal:        General: No swelling.  Skin:    General: Skin is warm and dry.     Capillary Refill: Capillary refill takes less than 2 seconds.     Findings: No rash.  Neurological:     Mental Status: He is alert.  Psychiatric:        Mood and Affect: Mood normal.     ED Results / Procedures / Treatments   Labs (all labs ordered are listed, but only abnormal results are displayed) Labs Reviewed - No data to display  EKG None  Radiology No results found.  Procedures Procedures (including critical care time)  Medications Ordered in ED Medications - No data to display  ED Course  I have reviewed the triage vital signs and the nursing notes.  Pertinent labs & imaging results that were available during my care of the patient were reviewed by me and considered in my medical decision making (see chart for details).    MDM Rules/Calculators/A&P                          I have personally reviewed all imaging, labs and have interpreted them.  Patient did not appear to be in acute distress, he was alert and  oriented.  On exam he did not have any complaints other than wanting a [place] sandwich with mayonnaise.  Ear canal and TMs were visualized no signs of infection noted, oropharynx was visualized no erythema or exudate noted.  There are some mild nasal congestion noted turbinates were nonswollen or erythematous.  I have low suspicion for pneumonia as patient was nontoxic-appearing, vital signs reassuring, lung sounds are clear bilaterally no rales or rhonchi heard.  Low suspicion for strep throat as oropharynx was visualized no exudates or erythema noted, patient denies throat pain or cough.  Low suspicion for otitis externa or media as ear canal and TMs were visualized no signs of infection.  Low suspicion for systemic infection as patient is nontoxic-appearing, vital signs reassuring, exam was benign.  Further lab work and imaging were not warranted.  Patient does not appear to be in any acute distress, vital signs have remained stable does not meet criteria to be admitted to the hospital.  Likely patient has URI will recommend over-the-counter pain medications as well as nasal decongestions.  Patient was discussed with attending who agreed with assessment and plan.  Patient is given at home care as well as strict return precautions.  Patient verbalized that he understood and agreed with said plan. Final Clinical Impression(s) / ED Diagnoses Final diagnoses:  Sinus congestion    Rx / DC Orders ED Discharge Orders    None       Marcello Fennel, PA-C 03/24/20 1231    Pattricia Boss, MD 03/24/20 [phone]

## 2020-03-24 NOTE — Discharge Instructions (Signed)
You have been seen here for nasal congestion, exam and vitals look reassuring.  I recommend that you take over-the-counter allergy medications like Claritin and Flonase as this can help with decongestion.  I have given you information for community [facility] they work with individuals with little to no insurance and can help you find a primary care provider.  Please call them at your earliest convenience.  I have also given you information for shelters as well as financial assistance please contact them for further assistance.  I want to come back to the emergency department if you develop severe chest pain, shortness of breath, severe abdominal pain, uncontrolled nausea, vomiting, diarrhea as the symptoms require further evaluation and management.

## 2020-03-25 ENCOUNTER — Encounter (HOSPITAL_COMMUNITY): Payer: Self-pay | Admitting: Emergency Medicine

## 2020-03-25 ENCOUNTER — Ambulatory Visit (HOSPITAL_COMMUNITY): Payer: Medicare Other | Admitting: *Deleted

## 2020-03-25 ENCOUNTER — Other Ambulatory Visit: Payer: Self-pay

## 2020-03-25 ENCOUNTER — Emergency Department (HOSPITAL_COMMUNITY)
Admission: EM | Admit: 2020-03-25 | Discharge: 2020-03-26 | Disposition: A | Discharge status: Home / Self Care | Payer: Medicare Other | Source: Home / Self Care

## 2020-03-25 ENCOUNTER — Emergency Department (HOSPITAL_COMMUNITY)
Admission: EM | Admit: 2020-03-25 | Discharge: 2020-03-25 | Disposition: A | Discharge status: Home / Self Care | Payer: Medicare Other | Attending: Emergency Medicine | Admitting: Emergency Medicine

## 2020-03-25 ENCOUNTER — Encounter (HOSPITAL_COMMUNITY): Payer: Self-pay

## 2020-03-25 VITALS — BP 145/90 | HR 74

## 2020-03-25 DIAGNOSIS — M25561 Pain in right knee: Secondary | ICD-10-CM | POA: Insufficient documentation

## 2020-03-25 DIAGNOSIS — R0989 Other specified symptoms and signs involving the circulatory and respiratory systems: Secondary | ICD-10-CM | POA: Insufficient documentation

## 2020-03-25 DIAGNOSIS — G8929 Other chronic pain: Secondary | ICD-10-CM | POA: Diagnosis not present

## 2020-03-25 DIAGNOSIS — F200 Paranoid schizophrenia: Secondary | ICD-10-CM

## 2020-03-25 DIAGNOSIS — I110 Hypertensive heart disease with heart failure: Secondary | ICD-10-CM | POA: Diagnosis not present

## 2020-03-25 DIAGNOSIS — Z5321 Procedure and treatment not carried out due to patient leaving prior to being seen by health care provider: Secondary | ICD-10-CM | POA: Insufficient documentation

## 2020-03-25 DIAGNOSIS — M25562 Pain in left knee: Secondary | ICD-10-CM | POA: Insufficient documentation

## 2020-03-25 DIAGNOSIS — Z79899 Other long term (current) drug therapy: Secondary | ICD-10-CM | POA: Diagnosis not present

## 2020-03-25 DIAGNOSIS — I5030 Unspecified diastolic (congestive) heart failure: Secondary | ICD-10-CM | POA: Diagnosis not present

## 2020-03-25 DIAGNOSIS — Z7982 Long term (current) use of aspirin: Secondary | ICD-10-CM | POA: Insufficient documentation

## 2020-03-25 DIAGNOSIS — F17210 Nicotine dependence, cigarettes, uncomplicated: Secondary | ICD-10-CM | POA: Insufficient documentation

## 2020-03-25 NOTE — Progress Notes (Signed)
In as scheduled for his every two week injection of Prolixin -D 25 mg. He got todays injection in R deltoid. He is neatly dressed. He is happy and verbal and denies any sx of psychosis. He states since he has been taking the shot he has not had any problems at all. He is not interested in changing shots since he does well on the Prolixin and he has been taking it for a long time. He continues to spend nights in the Hospital ED and days here and walking around and also spends time with his wife. He states Aaron Edelman has found him an appt already but no other details was he able to provide. Follow up in 2 weeks for next injection.

## 2020-03-25 NOTE — ED Provider Notes (Signed)
[facility] EMERGENCY DEPARTMENT Provider Note   CSN: [id number] Arrival date & time: 03/25/20  0021     History Chief Complaint  Patient presents with  . Knee Pain    Sinus Congestion     [name] is a 69 y.o. male.  The history is provided by the patient and medical records.  Knee Pain  69 year old male with history of BPH, depression, GERD, hepatitis C, hypertension, schizophrenia, presenting to the ED for bilateral knee pain and sinus congestion.  Patient well-known to this facility for same, 113 visits in the past 6 months.  States he is overall doing okay.  He wants to go to [employer] now to get an iced tea.  He states he feels good in the recliner currently.  Past Medical History:  Diagnosis Date  . BPH (benign prostatic hyperplasia)   . Colon polyp 2010  . Depression   . GERD (gastroesophageal reflux disease)   . Hepatitis C    "caught it when I had a blood transfusion"  . High cholesterol   . History of blood transfusion    "when I was young"  . Hypertension   . Paranoid schizophrenia ([place])   . Prostate atrophy   . Retinal vein occlusion     Patient Active Problem List   Diagnosis Date Noted  . GERD (gastroesophageal reflux disease) 05/03/2019  . Hypotension 08/02/2018  . Low back sprain, initial encounter 10/21/2017  . History of drug abuse ([place]) 10/18/2017  . Gingival erythema 09/23/2017  . Chronic pain of left knee 11/24/2016  . Tobacco abuse   . Diastolic heart failure ([place]) 02/05/2015  . Healthcare maintenance 05/28/2014  . Hyperlipidemia 03/10/2014  . Patellar tendonitis 11/16/2012  . [place] OCCLUSION 06/03/2010  . PARANOID SCHIZOPHRENIA, CHRONIC 01/17/2009  . HYPERTENSION, BENIGN ESSENTIAL 01/17/2009  . BPH (benign prostatic hyperplasia) 01/17/2009  . HEPATITIS C 12/14/2008  . DEPRESSION 12/14/2008    Past Surgical History:  Procedure Laterality Date  . CIRCUMCISION  1959  . TONSILLECTOMY          Family History  Problem Relation Age of Onset  . Hypertension Mother   . Diabetes Mother   . Stroke Mother     Social History   Tobacco Use  . Smoking status: Current Every Day Smoker    Packs/day: 0.50    Years: 48.00    Pack years: 24.00    Types: Cigarettes  . Smokeless tobacco: Never Used  Substance Use Topics  . Alcohol use: Yes  . Drug use: No    Home Medications Prior to Admission medications   Medication Sig Start Date End Date Taking? Authorizing Provider  amLODipine (NORVASC) 10 MG tablet Take 1 tablet (10 mg total) by mouth daily. 04/13/18   [name], DO  aspirin 81 MG tablet Take 1 tablet (81 mg total) by mouth daily. 09/24/14   [name], MD  atorvastatin (LIPITOR) 40 MG tablet Take 1 tablet (40 mg total) by mouth daily. 08/15/19   [name], DO  benztropine (COGENTIN) 1 MG tablet Take 1 tablet (1 mg total) by mouth 2 (two) times daily. 02/26/20   Salley Slaughter, NP  cetirizine (ZYRTEC ALLERGY) 10 MG tablet Take 1 tablet (10 mg total) by mouth daily. 02/01/20   [name], [name], PA-C  famotidine (PEPCID) 20 MG tablet Take 1 tablet (20 mg total) by mouth 2 (two) times daily. 05/02/19   [name], DO  fluPHENAZine decanoate (PROLIXIN) 25 MG/ML  injection Inject 1 mL (25 mg total) into the muscle every 14 (fourteen) days. Last dose 09/07/12 02/26/20   Salley Slaughter, NP  fluticasone (FLONASE) 50 MCG/ACT nasal spray Place 2 sprays into both nostrils daily. 03/17/20   Couture, Cortni S, PA-C  hydrOXYzine (VISTARIL) 50 MG capsule Take 50 mg by mouth at bedtime. 02/01/20   [provider]  losartan (COZAAR) 100 MG tablet Take 1 tablet (100 mg total) by mouth daily. 11/28/19   Tammi Klippel, Sherin, DO  mometasone (NASONEX) 50 MCG/ACT nasal spray Place 2 sprays into the nose daily. 12/29/19   [name], PA-C  Multiple Vitamin (MULTIVITAMIN WITH MINERALS) TABS tablet Take 1 tablet by mouth daily. 06/28/14   [name], MD   sodium chloride (OCEAN) 0.65 % SOLN nasal spray Place 1 spray into both nostrils as needed for congestion. 11/19/19   [name], PA-C  tamsulosin (FLOMAX) 0.4 MG CAPS capsule TAKE 2 CAPSULE BY MOUTH DAILY Patient taking differently: Take 0.8 mg by mouth daily.  02/23/20   Mullis, Kiersten P, DO  traMADol (ULTRAM) 50 MG tablet Take 1 tablet (50 mg total) by mouth every 6 (six) hours as needed. 07/04/18   [name], MD  triamcinolone cream (KENALOG) 0.1 % Apply 1 application topically 2 (two) times daily. For 5 days, then twice daily as needed for rash and itching. 01/03/15   [name], MD  vitamin C (ASCORBIC ACID) 500 MG tablet Take 500 mg by mouth daily.    [provider]  loratadine (CLARITIN) 10 MG tablet Take 1 tablet (10 mg total) by mouth daily. 12/28/19 02/05/20  [name], DO    Allergies    Lisinopril, Penicillins, Amoxicillin, and Ibuprofen  Review of Systems   Review of Systems  HENT: Positive for congestion.   Musculoskeletal: Positive for arthralgias.  All other systems reviewed and are negative.   Physical Exam Updated Vital Signs BP (!) 150/86   Pulse 67   Temp 98.6 F (37 C)   Resp 19   SpO2 98%   Physical Exam Vitals and nursing note reviewed.  Constitutional:      Appearance: He is well-developed.     Comments: Sleeping, NAD  HENT:     Head: Normocephalic and atraumatic.  Eyes:     Conjunctiva/sclera: Conjunctivae normal.     Pupils: Pupils are equal, round, and reactive to light.  Cardiovascular:     Rate and Rhythm: Normal rate and regular rhythm.     Heart sounds: Normal heart sounds.  Pulmonary:     Effort: Pulmonary effort is normal. No respiratory distress.     Breath sounds: Normal breath sounds. No rhonchi.  Abdominal:     General: Bowel sounds are normal.     Palpations: Abdomen is soft.     Tenderness: There is no abdominal tenderness. There is no rebound.  Musculoskeletal:        General: Normal range of motion.      Cervical back: Normal range of motion.     Comments: Knees atraumatic, ambulatory  Skin:    General: Skin is warm and dry.  Neurological:     Mental Status: He is alert and oriented to person, place, and time.     ED Results / Procedures / Treatments   Labs (all labs ordered are listed, but only abnormal results are displayed) Labs Reviewed - No data to display  EKG None  Radiology No results found.  Procedures Procedures (including critical care time)  Medications Ordered in ED Medications - No data to display  ED Course  I have reviewed the triage vital signs and the nursing notes.  Pertinent labs & imaging results that were available during my care of the patient were reviewed by me and considered in my medical decision making (see chart for details).    MDM Rules/Calculators/A&P  69 year old male presenting to the ED with knee pain and sinus congestion.  These are chronic complaints.  113 visits in the past 6 months for similar.  He does not have any signs of new trauma on exam.  No fever or other infectious symptoms.  Stable for discharge.  Final Clinical Impression(s) / ED Diagnoses Final diagnoses:  Chronic knee pain, unspecified laterality    Rx / DC Orders ED Discharge Orders    None       [name], PA-C 03/25/20 0529    Fatima Blank, MD 03/25/20 [phone]

## 2020-03-25 NOTE — ED Triage Notes (Signed)
Patient reports left knee pain today , denies injury /ambulatory , patient added nasal congestion.

## 2020-03-25 NOTE — ED Triage Notes (Signed)
Left knee is causing pt pain again, no other complaints at this time.

## 2020-03-25 NOTE — ED Notes (Signed)
Pt appears to be at baseline, one touch pt see provider's assessment.

## 2020-03-26 ENCOUNTER — Emergency Department (HOSPITAL_COMMUNITY)
Admission: EM | Admit: 2020-03-26 | Discharge: 2020-03-27 | Disposition: A | Discharge status: Home / Self Care | Payer: Medicare Other | Attending: Emergency Medicine | Admitting: Emergency Medicine

## 2020-03-26 ENCOUNTER — Other Ambulatory Visit: Payer: Self-pay

## 2020-03-26 DIAGNOSIS — Z79899 Other long term (current) drug therapy: Secondary | ICD-10-CM | POA: Insufficient documentation

## 2020-03-26 DIAGNOSIS — I10 Essential (primary) hypertension: Secondary | ICD-10-CM | POA: Insufficient documentation

## 2020-03-26 DIAGNOSIS — F17210 Nicotine dependence, cigarettes, uncomplicated: Secondary | ICD-10-CM | POA: Insufficient documentation

## 2020-03-26 DIAGNOSIS — Z7982 Long term (current) use of aspirin: Secondary | ICD-10-CM | POA: Diagnosis not present

## 2020-03-26 DIAGNOSIS — M25562 Pain in left knee: Secondary | ICD-10-CM | POA: Diagnosis present

## 2020-03-26 DIAGNOSIS — G8929 Other chronic pain: Secondary | ICD-10-CM | POA: Diagnosis not present

## 2020-03-27 ENCOUNTER — Encounter (HOSPITAL_COMMUNITY): Payer: Self-pay | Admitting: Emergency Medicine

## 2020-03-27 ENCOUNTER — Other Ambulatory Visit: Payer: Self-pay

## 2020-03-27 MED ORDER — ACETAMINOPHEN 325 MG PO TABS
650.0000 mg | ORAL_TABLET | Freq: Once | ORAL | Status: AC
  Administered 2020-03-27: 650 mg via ORAL
  Filled 2020-03-27: qty 2

## 2020-03-27 NOTE — ED Provider Notes (Signed)
[facility] EMERGENCY DEPARTMENT Provider Note   CSN: [id number] Arrival date & time: 03/26/20  2226     History Chief Complaint  Patient presents with  . Knee Pain    [name] is a 69 y.o. male.  HPI 69 year old male presents with left knee pain.  States it is swollen.  This is a chronic issue for him and he has been seen in the emergency department over 100 times in the last 6 months.  Is asking for bag lunch as well.   Past Medical History:  Diagnosis Date  . BPH (benign prostatic hyperplasia)   . Colon polyp 2010  . Depression   . GERD (gastroesophageal reflux disease)   . Hepatitis C    "caught it when I had a blood transfusion"  . High cholesterol   . History of blood transfusion    "when I was young"  . Hypertension   . Paranoid schizophrenia ([place])   . Prostate atrophy   . Retinal vein occlusion     Patient Active Problem List   Diagnosis Date Noted  . GERD (gastroesophageal reflux disease) 05/03/2019  . Hypotension 08/02/2018  . Low back sprain, initial encounter 10/21/2017  . History of drug abuse ([place]) 10/18/2017  . Gingival erythema 09/23/2017  . Chronic pain of left knee 11/24/2016  . Tobacco abuse   . Diastolic heart failure ([place]) 02/05/2015  . Healthcare maintenance 05/28/2014  . Hyperlipidemia 03/10/2014  . Patellar tendonitis 11/16/2012  . [place] OCCLUSION 06/03/2010  . PARANOID SCHIZOPHRENIA, CHRONIC 01/17/2009  . HYPERTENSION, BENIGN ESSENTIAL 01/17/2009  . BPH (benign prostatic hyperplasia) 01/17/2009  . HEPATITIS C 12/14/2008  . DEPRESSION 12/14/2008    Past Surgical History:  Procedure Laterality Date  . CIRCUMCISION  1959  . TONSILLECTOMY         Family History  Problem Relation Age of Onset  . Hypertension Mother   . Diabetes Mother   . Stroke Mother     Social History   Tobacco Use  . Smoking status: Current Every Day Smoker    Packs/day: 0.50    Years: 48.00    Pack years: 24.00     Types: Cigarettes  . Smokeless tobacco: Never Used  Substance Use Topics  . Alcohol use: Yes  . Drug use: No    Home Medications Prior to Admission medications   Medication Sig Start Date End Date Taking? Authorizing Provider  amLODipine (NORVASC) 10 MG tablet Take 1 tablet (10 mg total) by mouth daily. 04/13/18   [name], DO  aspirin 81 MG tablet Take 1 tablet (81 mg total) by mouth daily. 09/24/14   [name], MD  atorvastatin (LIPITOR) 40 MG tablet Take 1 tablet (40 mg total) by mouth daily. 08/15/19   [name], DO  benztropine (COGENTIN) 1 MG tablet Take 1 tablet (1 mg total) by mouth 2 (two) times daily. 02/26/20   Salley Slaughter, NP  cetirizine (ZYRTEC ALLERGY) 10 MG tablet Take 1 tablet (10 mg total) by mouth daily. 02/01/20   [name], [name], PA-C  famotidine (PEPCID) 20 MG tablet Take 1 tablet (20 mg total) by mouth 2 (two) times daily. 05/02/19   [name], DO  fluPHENAZine decanoate (PROLIXIN) 25 MG/ML injection Inject 1 mL (25 mg total) into the muscle every 14 (fourteen) days. Last dose 09/07/12 02/26/20   Salley Slaughter, NP  fluticasone (FLONASE) 50 MCG/ACT nasal spray Place 2 sprays into both nostrils daily. 03/17/20  Couture, Cortni S, PA-C  hydrOXYzine (VISTARIL) 50 MG capsule Take 50 mg by mouth at bedtime. 02/01/20   [provider]  losartan (COZAAR) 100 MG tablet Take 1 tablet (100 mg total) by mouth daily. 11/28/19   Tammi Klippel, Sherin, DO  mometasone (NASONEX) 50 MCG/ACT nasal spray Place 2 sprays into the nose daily. 12/29/19   [name], PA-C  Multiple Vitamin (MULTIVITAMIN WITH MINERALS) TABS tablet Take 1 tablet by mouth daily. 06/28/14   [name], MD  sodium chloride (OCEAN) 0.65 % SOLN nasal spray Place 1 spray into both nostrils as needed for congestion. 11/19/19   [name], PA-C  tamsulosin (FLOMAX) 0.4 MG CAPS capsule TAKE 2 CAPSULE BY MOUTH DAILY Patient taking differently: Take 0.8 mg by mouth  daily.  02/23/20   Mullis, Kiersten P, DO  traMADol (ULTRAM) 50 MG tablet Take 1 tablet (50 mg total) by mouth every 6 (six) hours as needed. 07/04/18   [name], MD  triamcinolone cream (KENALOG) 0.1 % Apply 1 application topically 2 (two) times daily. For 5 days, then twice daily as needed for rash and itching. 01/03/15   [name], MD  vitamin C (ASCORBIC ACID) 500 MG tablet Take 500 mg by mouth daily.    [provider]  loratadine (CLARITIN) 10 MG tablet Take 1 tablet (10 mg total) by mouth daily. 12/28/19 02/05/20  [name], DO    Allergies    Lisinopril, Penicillins, Amoxicillin, and Ibuprofen  Review of Systems   Review of Systems  Musculoskeletal: Positive for arthralgias and joint swelling.    Physical Exam Updated Vital Signs BP (!) 162/102   Pulse (!) 58   Temp 98.1 F (36.7 C) (Oral)   Resp 18   SpO2 100%   Physical Exam Vitals and nursing note reviewed.  Constitutional:      Appearance: He is well-developed.  HENT:     Head: Normocephalic and atraumatic.     Right Ear: External ear normal.     Left Ear: External ear normal.     Nose: Nose normal.  Eyes:     General:        Right eye: No discharge.        Left eye: No discharge.  Pulmonary:     Effort: Pulmonary effort is normal.  Abdominal:     General: There is no distension.  Musculoskeletal:     Left knee: No swelling, effusion or erythema. Normal range of motion. No tenderness.  Skin:    General: Skin is warm and dry.     Findings: No erythema.  Neurological:     Mental Status: He is alert.  Psychiatric:        Mood and Affect: Mood is not anxious.     ED Results / Procedures / Treatments   Labs (all labs ordered are listed, but only abnormal results are displayed) Labs Reviewed - No data to display  EKG None  Radiology No results found.  Procedures Procedures (including critical care time)  Medications Ordered in ED Medications  acetaminophen (TYLENOL) tablet  650 mg (has no administration in time range)    ED Course  I have reviewed the triage vital signs and the nursing notes.  Pertinent labs & imaging results that were available during my care of the patient were reviewed by me and considered in my medical decision making (see chart for details).    MDM Rules/Calculators/A&P  On exam, there is no sign of a joint effusion or acute infection.  No warmth or erythema.  He is afebrile.  He is here often for the same and at this point there does not appear to be an emergent medical condition.  Discharge. Final Clinical Impression(s) / ED Diagnoses Final diagnoses:  Chronic pain of left knee    Rx / DC Orders ED Discharge Orders    None       Sherwood Gambler, MD 03/27/20 [phone]

## 2020-03-27 NOTE — ED Triage Notes (Signed)
Patient here with his chronic knee pain.

## 2020-03-28 ENCOUNTER — Other Ambulatory Visit: Payer: Self-pay

## 2020-03-28 ENCOUNTER — Emergency Department (HOSPITAL_COMMUNITY)
Admission: EM | Admit: 2020-03-28 | Discharge: 2020-03-28 | Disposition: A | Discharge status: Home / Self Care | Payer: Medicare Other | Attending: Emergency Medicine | Admitting: Emergency Medicine

## 2020-03-28 DIAGNOSIS — M25562 Pain in left knee: Secondary | ICD-10-CM | POA: Insufficient documentation

## 2020-03-28 DIAGNOSIS — F17210 Nicotine dependence, cigarettes, uncomplicated: Secondary | ICD-10-CM | POA: Insufficient documentation

## 2020-03-28 DIAGNOSIS — G8929 Other chronic pain: Secondary | ICD-10-CM | POA: Insufficient documentation

## 2020-03-28 DIAGNOSIS — Z20822 Contact with and (suspected) exposure to covid-19: Secondary | ICD-10-CM | POA: Insufficient documentation

## 2020-03-28 DIAGNOSIS — Z79899 Other long term (current) drug therapy: Secondary | ICD-10-CM | POA: Insufficient documentation

## 2020-03-28 DIAGNOSIS — Z7982 Long term (current) use of aspirin: Secondary | ICD-10-CM | POA: Diagnosis not present

## 2020-03-28 DIAGNOSIS — I110 Hypertensive heart disease with heart failure: Secondary | ICD-10-CM | POA: Insufficient documentation

## 2020-03-28 DIAGNOSIS — M25512 Pain in left shoulder: Secondary | ICD-10-CM | POA: Diagnosis present

## 2020-03-28 DIAGNOSIS — R0981 Nasal congestion: Secondary | ICD-10-CM | POA: Insufficient documentation

## 2020-03-28 DIAGNOSIS — I5030 Unspecified diastolic (congestive) heart failure: Secondary | ICD-10-CM | POA: Insufficient documentation

## 2020-03-28 LAB — SARS CORONAVIRUS 2 BY RT PCR (HOSPITAL ORDER, PERFORMED IN ~~LOC~~ HOSPITAL LAB): SARS Coronavirus 2: NEGATIVE

## 2020-03-28 NOTE — ED Triage Notes (Signed)
Pt c/o left arm/shoulder pain, knee pain and sinus issues. Pt is smiling while entering into triage, gait steady. NAD noted.

## 2020-03-28 NOTE — ED Provider Notes (Signed)
[facility] EMERGENCY DEPARTMENT Provider Note   CSN: [id number] Arrival date & time: 03/28/20  0016     History Chief Complaint  Patient presents with  . Multiple Complaints    [name] is a 69 y.o. male.  Patient presents to the emergency department with complaints of left shoulder pain, knee pain and sinus congestion.  The symptoms have all been chronic in nature for him.  He is ambulatory without difficulty.  Patient denies any direct trauma.        Past Medical History:  Diagnosis Date  . BPH (benign prostatic hyperplasia)   . Colon polyp 2010  . Depression   . GERD (gastroesophageal reflux disease)   . Hepatitis C    "caught it when I had a blood transfusion"  . High cholesterol   . History of blood transfusion    "when I was young"  . Hypertension   . Paranoid schizophrenia ([place])   . Prostate atrophy   . Retinal vein occlusion     Patient Active Problem List   Diagnosis Date Noted  . GERD (gastroesophageal reflux disease) 05/03/2019  . Hypotension 08/02/2018  . Low back sprain, initial encounter 10/21/2017  . History of drug abuse ([place]) 10/18/2017  . Gingival erythema 09/23/2017  . Chronic pain of left knee 11/24/2016  . Tobacco abuse   . Diastolic heart failure ([place]) 02/05/2015  . Healthcare maintenance 05/28/2014  . Hyperlipidemia 03/10/2014  . Patellar tendonitis 11/16/2012  . [place] OCCLUSION 06/03/2010  . PARANOID SCHIZOPHRENIA, CHRONIC 01/17/2009  . HYPERTENSION, BENIGN ESSENTIAL 01/17/2009  . BPH (benign prostatic hyperplasia) 01/17/2009  . HEPATITIS C 12/14/2008  . DEPRESSION 12/14/2008    Past Surgical History:  Procedure Laterality Date  . CIRCUMCISION  1959  . TONSILLECTOMY         Family History  Problem Relation Age of Onset  . Hypertension Mother   . Diabetes Mother   . Stroke Mother     Social History   Tobacco Use  . Smoking status: Current Every Day Smoker    Packs/day: 0.50     Years: 48.00    Pack years: 24.00    Types: Cigarettes  . Smokeless tobacco: Never Used  Substance Use Topics  . Alcohol use: Yes  . Drug use: No    Home Medications Prior to Admission medications   Medication Sig Start Date End Date Taking? Authorizing Provider  amLODipine (NORVASC) 10 MG tablet Take 1 tablet (10 mg total) by mouth daily. 04/13/18   [name], DO  aspirin 81 MG tablet Take 1 tablet (81 mg total) by mouth daily. 09/24/14   [name], MD  atorvastatin (LIPITOR) 40 MG tablet Take 1 tablet (40 mg total) by mouth daily. 08/15/19   [name], DO  benztropine (COGENTIN) 1 MG tablet Take 1 tablet (1 mg total) by mouth 2 (two) times daily. 02/26/20   Salley Slaughter, NP  cetirizine (ZYRTEC ALLERGY) 10 MG tablet Take 1 tablet (10 mg total) by mouth daily. 02/01/20   [name], [name], PA-C  famotidine (PEPCID) 20 MG tablet Take 1 tablet (20 mg total) by mouth 2 (two) times daily. 05/02/19   [name], DO  fluPHENAZine decanoate (PROLIXIN) 25 MG/ML injection Inject 1 mL (25 mg total) into the muscle every 14 (fourteen) days. Last dose 09/07/12 02/26/20   Salley Slaughter, NP  fluticasone (FLONASE) 50 MCG/ACT nasal spray Place 2 sprays into both nostrils daily. 03/17/20   Couture,  Cortni S, PA-C  hydrOXYzine (VISTARIL) 50 MG capsule Take 50 mg by mouth at bedtime. 02/01/20   [provider]  losartan (COZAAR) 100 MG tablet Take 1 tablet (100 mg total) by mouth daily. 11/28/19   Tammi Klippel, Sherin, DO  mometasone (NASONEX) 50 MCG/ACT nasal spray Place 2 sprays into the nose daily. 12/29/19   [name], PA-C  Multiple Vitamin (MULTIVITAMIN WITH MINERALS) TABS tablet Take 1 tablet by mouth daily. 06/28/14   [name], MD  sodium chloride (OCEAN) 0.65 % SOLN nasal spray Place 1 spray into both nostrils as needed for congestion. 11/19/19   [name], PA-C  tamsulosin (FLOMAX) 0.4 MG CAPS capsule TAKE 2 CAPSULE BY MOUTH DAILY Patient taking  differently: Take 0.8 mg by mouth daily.  02/23/20   Mullis, Kiersten P, DO  traMADol (ULTRAM) 50 MG tablet Take 1 tablet (50 mg total) by mouth every 6 (six) hours as needed. 07/04/18   [name], MD  triamcinolone cream (KENALOG) 0.1 % Apply 1 application topically 2 (two) times daily. For 5 days, then twice daily as needed for rash and itching. 01/03/15   [name], MD  vitamin C (ASCORBIC ACID) 500 MG tablet Take 500 mg by mouth daily.    [provider]  loratadine (CLARITIN) 10 MG tablet Take 1 tablet (10 mg total) by mouth daily. 12/28/19 02/05/20  [name], DO    Allergies    Lisinopril, Penicillins, Amoxicillin, and Ibuprofen  Review of Systems   Review of Systems  HENT: Positive for congestion.   Musculoskeletal: Positive for arthralgias.  All other systems reviewed and are negative.   Physical Exam Updated Vital Signs BP (!) 139/91 (BP Location: Right Arm)   Pulse 66   Temp 99 F (37.2 C) (Oral)   Resp 17   SpO2 97%   Physical Exam Vitals and nursing note reviewed.  Constitutional:      General: He is not in acute distress.    Appearance: Normal appearance. He is well-developed.  HENT:     Head: Normocephalic and atraumatic.     Right Ear: Hearing normal.     Left Ear: Hearing normal.     Nose: Nose normal.  Eyes:     Conjunctiva/sclera: Conjunctivae normal.     Pupils: Pupils are equal, round, and reactive to light.  Cardiovascular:     Rate and Rhythm: Regular rhythm.     Heart sounds: S1 normal and S2 normal. No murmur heard.  No friction rub. No gallop.   Pulmonary:     Effort: Pulmonary effort is normal. No respiratory distress.     Breath sounds: Normal breath sounds.  Chest:     Chest wall: No tenderness.  Abdominal:     General: Bowel sounds are normal.     Palpations: Abdomen is soft.     Tenderness: There is no abdominal tenderness. There is no guarding or rebound. Negative signs include Murphy's sign and McBurney's sign.      Hernia: No hernia is present.  Musculoskeletal:        General: Normal range of motion.     Left shoulder: No swelling, deformity or bony tenderness. Normal range of motion.     Cervical back: Normal range of motion and neck supple.     Right knee: Normal. No swelling, deformity, effusion or erythema. Normal range of motion.     Left knee: Normal. No swelling, deformity, effusion or erythema. Normal range of motion.  Skin:  General: Skin is warm and dry.     Findings: No rash.  Neurological:     Mental Status: He is alert and oriented to person, place, and time.     GCS: GCS eye subscore is 4. GCS verbal subscore is 5. GCS motor subscore is 6.     Cranial Nerves: No cranial nerve deficit.     Sensory: No sensory deficit.     Coordination: Coordination normal.  Psychiatric:        Speech: Speech normal.        Behavior: Behavior normal.        Thought Content: Thought content normal.     ED Results / Procedures / Treatments   Labs (all labs ordered are listed, but only abnormal results are displayed) Labs Reviewed - No data to display  EKG None  Radiology No results found.  Procedures Procedures (including critical care time)  Medications Ordered in ED Medications - No data to display  ED Course  I have reviewed the triage vital signs and the nursing notes.  Pertinent labs & imaging results that were available during my care of the patient were reviewed by me and considered in my medical decision making (see chart for details).    MDM Rules/Calculators/A&P                          Patient presents to the emergency department with multiple complaints.  He has had some sinus "issues".  He has no fever, no cough, shortness of breath.  No other URI symptoms, no other Covid symptoms.  Will send Covid swab.  Patient complaining of left shoulder pain.  He has full range of motion, no abnormality noted on exam.  Patient also complaining of knee pain which is a very chronic  complaint for him.  No signs of injury, no effusion, no erythema or warmth.  No concern for septic joint.  Does not require any intervention.  Final Clinical Impression(s) / ED Diagnoses Final diagnoses:  Chronic pain of left knee    Rx / DC Orders ED Discharge Orders    None       Talya Quain, Gwenyth Allegra, MD 03/28/20 0451

## 2020-03-28 NOTE — ED Notes (Signed)
Pt verbalized understanding of d/c instructions, ambulatory to [facility]

## 2020-03-29 ENCOUNTER — Emergency Department (HOSPITAL_COMMUNITY)
Admission: EM | Admit: 2020-03-29 | Discharge: 2020-03-29 | Disposition: A | Discharge status: Home / Self Care | Payer: Medicare Other | Attending: Emergency Medicine | Admitting: Emergency Medicine

## 2020-03-29 ENCOUNTER — Emergency Department (HOSPITAL_COMMUNITY): Payer: Medicare Other

## 2020-03-29 ENCOUNTER — Other Ambulatory Visit: Payer: Self-pay

## 2020-03-29 DIAGNOSIS — M25569 Pain in unspecified knee: Secondary | ICD-10-CM | POA: Diagnosis not present

## 2020-03-29 DIAGNOSIS — Z79899 Other long term (current) drug therapy: Secondary | ICD-10-CM | POA: Diagnosis not present

## 2020-03-29 DIAGNOSIS — F17210 Nicotine dependence, cigarettes, uncomplicated: Secondary | ICD-10-CM | POA: Insufficient documentation

## 2020-03-29 DIAGNOSIS — G8929 Other chronic pain: Secondary | ICD-10-CM | POA: Insufficient documentation

## 2020-03-29 DIAGNOSIS — I5030 Unspecified diastolic (congestive) heart failure: Secondary | ICD-10-CM | POA: Diagnosis not present

## 2020-03-29 DIAGNOSIS — R0981 Nasal congestion: Secondary | ICD-10-CM | POA: Diagnosis present

## 2020-03-29 DIAGNOSIS — Z7982 Long term (current) use of aspirin: Secondary | ICD-10-CM | POA: Insufficient documentation

## 2020-03-29 DIAGNOSIS — I110 Hypertensive heart disease with heart failure: Secondary | ICD-10-CM | POA: Diagnosis not present

## 2020-03-29 LAB — COMPREHENSIVE METABOLIC PANEL
ALT: 18 U/L (ref 0–44)
AST: 24 U/L (ref 15–41)
Albumin: 3.7 g/dL (ref 3.5–5.0)
Alkaline Phosphatase: 37 U/L — ABNORMAL LOW (ref 38–126)
Anion gap: 10 (ref 5–15)
BUN: 12 mg/dL (ref 8–23)
CO2: 25 mmol/L (ref 22–32)
Calcium: 8.8 mg/dL — ABNORMAL LOW (ref 8.9–10.3)
Chloride: 107 mmol/L (ref 98–111)
Creatinine, Ser: 1.59 mg/dL — ABNORMAL HIGH (ref 0.61–1.24)
GFR calc Af Amer: 51 mL/min — ABNORMAL LOW (ref >60)
GFR calc non Af Amer: 44 mL/min — ABNORMAL LOW (ref >60)
Glucose, Bld: 88 mg/dL (ref 70–99)
Potassium: 4.5 mmol/L (ref 3.5–5.1)
Sodium: 142 mmol/L (ref 135–145)
Total Bilirubin: 0.6 mg/dL (ref 0.3–1.2)
Total Protein: 6.3 g/dL — ABNORMAL LOW (ref 6.5–8.1)

## 2020-03-29 LAB — CBC
HCT: 37.9 % — ABNORMAL LOW (ref 39.0–52.0)
Hemoglobin: 11.9 g/dL — ABNORMAL LOW (ref 13.0–17.0)
MCH: 29 pg (ref 26.0–34.0)
MCHC: 31.4 g/dL (ref 30.0–36.0)
MCV: 92.2 fL (ref 80.0–100.0)
Platelets: 106 10*3/uL — ABNORMAL LOW (ref 150–400)
RBC: 4.11 MIL/uL — ABNORMAL LOW (ref 4.22–5.81)
RDW: 13.9 % (ref 11.5–15.5)
WBC: 5 10*3/uL (ref 4.0–10.5)
nRBC: 0 % (ref 0.0–0.2)

## 2020-03-29 LAB — RAPID URINE DRUG SCREEN, HOSP PERFORMED
Amphetamines: NOT DETECTED
Barbiturates: NOT DETECTED
Benzodiazepines: NOT DETECTED
Cocaine: NOT DETECTED
Opiates: NOT DETECTED
Tetrahydrocannabinol: NOT DETECTED

## 2020-03-29 LAB — ETHANOL: Alcohol, Ethyl (B): <10 mg/dL (ref <10)

## 2020-03-29 NOTE — ED Triage Notes (Signed)
Pt has nasal congestion

## 2020-03-29 NOTE — ED Notes (Signed)
Transported to radiology 

## 2020-03-29 NOTE — ED Notes (Signed)
Asked pt for urine sample, left urinal at bedside

## 2020-03-29 NOTE — ED Provider Notes (Signed)
[facility] EMERGENCY DEPARTMENT Provider Note   CSN: [id number] Arrival date & time: 03/29/20  0017     History Chief Complaint  Patient presents with  . Nasal Congestion    [name] is a 69 y.o. male.  HPI    69 yo male history of polysubstance abuse, homeless presented last night and cc states nasal congestion.  Patient sleeping on bed and only mumbling words to me.  Patient denies substance abuse.  Patient states he has no place to stay. Denies substance abuse currently.   Past Medical History:  Diagnosis Date  . BPH (benign prostatic hyperplasia)   . Colon polyp 2010  . Depression   . GERD (gastroesophageal reflux disease)   . Hepatitis C    "caught it when I had a blood transfusion"  . High cholesterol   . History of blood transfusion    "when I was young"  . Hypertension   . Paranoid schizophrenia ([place])   . Prostate atrophy   . Retinal vein occlusion     Patient Active Problem List   Diagnosis Date Noted  . GERD (gastroesophageal reflux disease) 05/03/2019  . Hypotension 08/02/2018  . Low back sprain, initial encounter 10/21/2017  . History of drug abuse ([place]) 10/18/2017  . Gingival erythema 09/23/2017  . Chronic pain of left knee 11/24/2016  . Tobacco abuse   . Diastolic heart failure ([place]) 02/05/2015  . Healthcare maintenance 05/28/2014  . Hyperlipidemia 03/10/2014  . Patellar tendonitis 11/16/2012  . [place] OCCLUSION 06/03/2010  . PARANOID SCHIZOPHRENIA, CHRONIC 01/17/2009  . HYPERTENSION, BENIGN ESSENTIAL 01/17/2009  . BPH (benign prostatic hyperplasia) 01/17/2009  . HEPATITIS C 12/14/2008  . DEPRESSION 12/14/2008    Past Surgical History:  Procedure Laterality Date  . CIRCUMCISION  1959  . TONSILLECTOMY         Family History  Problem Relation Age of Onset  . Hypertension Mother   . Diabetes Mother   . Stroke Mother     Social History   Tobacco Use  . Smoking status: Current Every Day Smoker     Packs/day: 0.50    Years: 48.00    Pack years: 24.00    Types: Cigarettes  . Smokeless tobacco: Never Used  Substance Use Topics  . Alcohol use: Yes  . Drug use: No    Home Medications Prior to Admission medications   Medication Sig Start Date End Date Taking? Authorizing Provider  amLODipine (NORVASC) 10 MG tablet Take 1 tablet (10 mg total) by mouth daily. 04/13/18   [name], DO  aspirin 81 MG tablet Take 1 tablet (81 mg total) by mouth daily. 09/24/14   [name], MD  atorvastatin (LIPITOR) 40 MG tablet Take 1 tablet (40 mg total) by mouth daily. 08/15/19   [name], DO  benztropine (COGENTIN) 1 MG tablet Take 1 tablet (1 mg total) by mouth 2 (two) times daily. 02/26/20   Salley Slaughter, NP  cetirizine (ZYRTEC ALLERGY) 10 MG tablet Take 1 tablet (10 mg total) by mouth daily. 02/01/20   [name], [name], PA-C  famotidine (PEPCID) 20 MG tablet Take 1 tablet (20 mg total) by mouth 2 (two) times daily. 05/02/19   [name], DO  fluPHENAZine decanoate (PROLIXIN) 25 MG/ML injection Inject 1 mL (25 mg total) into the muscle every 14 (fourteen) days. Last dose 09/07/12 02/26/20   Salley Slaughter, NP  fluticasone (FLONASE) 50 MCG/ACT nasal spray Place 2 sprays into both nostrils daily.  03/17/20   Couture, Cortni S, PA-C  hydrOXYzine (VISTARIL) 50 MG capsule Take 50 mg by mouth at bedtime. 02/01/20   [provider]  losartan (COZAAR) 100 MG tablet Take 1 tablet (100 mg total) by mouth daily. 11/28/19   Tammi Klippel, Sherin, DO  mometasone (NASONEX) 50 MCG/ACT nasal spray Place 2 sprays into the nose daily. 12/29/19   [name], PA-C  Multiple Vitamin (MULTIVITAMIN WITH MINERALS) TABS tablet Take 1 tablet by mouth daily. 06/28/14   [name], MD  sodium chloride (OCEAN) 0.65 % SOLN nasal spray Place 1 spray into both nostrils as needed for congestion. 11/19/19   [name], PA-C  tamsulosin (FLOMAX) 0.4 MG CAPS capsule TAKE 2 CAPSULE BY MOUTH  DAILY Patient taking differently: Take 0.8 mg by mouth daily.  02/23/20   Mullis, Kiersten P, DO  traMADol (ULTRAM) 50 MG tablet Take 1 tablet (50 mg total) by mouth every 6 (six) hours as needed. 07/04/18   [name], MD  triamcinolone cream (KENALOG) 0.1 % Apply 1 application topically 2 (two) times daily. For 5 days, then twice daily as needed for rash and itching. 01/03/15   [name], MD  vitamin C (ASCORBIC ACID) 500 MG tablet Take 500 mg by mouth daily.    [provider]  loratadine (CLARITIN) 10 MG tablet Take 1 tablet (10 mg total) by mouth daily. 12/28/19 02/05/20  [name], DO    Allergies    Lisinopril, Penicillins, Amoxicillin, and Ibuprofen  Review of Systems   Review of Systems  All other systems reviewed and are negative.   Physical Exam Updated Vital Signs BP (!) 160/88   Pulse 72   Temp 98 F (36.7 C) (Oral)   Resp 17   SpO2 99%   Physical Exam Vitals and nursing note reviewed.  Constitutional:      General: He is not in acute distress.    Appearance: He is not ill-appearing.     Comments: unkempt  HENT:     Head: Normocephalic.     Right Ear: External ear normal.     Left Ear: External ear normal.  Cardiovascular:     Rate and Rhythm: Normal rate and regular rhythm.  Pulmonary:     Effort: Pulmonary effort is normal.  Abdominal:     General: Abdomen is flat.     Palpations: Abdomen is soft.  Musculoskeletal:        General: Normal range of motion.     Cervical back: Normal range of motion.  Skin:    General: Skin is warm.     Capillary Refill: Capillary refill takes less than 2 seconds.  Neurological:     General: No focal deficit present.     Mental Status: He is oriented to person, place, and time.     Motor: No weakness.     Coordination: Coordination normal.     Deep Tendon Reflexes: Reflexes normal.     Comments: Facial asymmetry  Psychiatric:        Attention and Perception: He is inattentive.        Speech: He is  noncommunicative. Speech is slurred.        Behavior: Behavior is slowed.     ED Results / Procedures / Treatments   Labs (all labs ordered are listed, but only abnormal results are displayed) Labs Reviewed  SARS CORONAVIRUS 2 BY RT PCR ([place])    EKG None  Radiology  CT Head Wo Contrast  Result Date: 03/29/2020 CLINICAL DATA:  Altered mental status EXAM: CT HEAD WITHOUT CONTRAST TECHNIQUE: Contiguous axial images were obtained from the base of the skull through the vertex without intravenous contrast. COMPARISON:  None. FINDINGS: Brain: Mild atrophic changes and chronic white matter ischemic change is seen. Prior left parietal infarct is noted. No findings to suggest acute hemorrhage, acute infarction or space-occupying mass lesion are noted. Vascular: No hyperdense vessel or unexpected calcification. Skull: Normal. Negative for fracture or focal lesion. Sinuses/Orbits: No acute finding. Other: None. IMPRESSION: Chronic atrophic and ischemic changes without acute abnormality. Electronically Signed   By: Inez Catalina M.D.   On: 03/29/2020 09:10    Procedures Procedures (including critical care time)  Medications Ordered in ED Medications - No data to display  ED Course  I have reviewed the triage vital signs and the nursing notes.  Pertinent labs & imaging results that were available during my care of the patient were reviewed by me and considered in my medical decision making (see chart for details).    MDM Rules/Calculators/A&P                         Patient with multiple chronic health problems who is very sleepy on my initial evaluation.  He had a work-up subsequent to this.  Labs are within normal limits the exception of creatinine slightly elevated which appears to be at baseline for him.  In the interim, patient has been up and ambulating in the ED.  He appears stable for discharge. Final Clinical Impression(s) / ED  Diagnoses Final diagnoses:  Nasal congestion  Knee pain, unspecified chronicity, unspecified laterality    Rx / DC Orders ED Discharge Orders    None       Pattricia Boss, MD 03/29/20 1234

## 2020-03-30 ENCOUNTER — Emergency Department (HOSPITAL_COMMUNITY)
Admission: EM | Admit: 2020-03-30 | Discharge: 2020-03-31 | Disposition: A | Discharge status: Home / Self Care | Payer: Medicare Other | Source: Home / Self Care | Attending: Emergency Medicine | Admitting: Emergency Medicine

## 2020-03-30 ENCOUNTER — Encounter (HOSPITAL_COMMUNITY): Payer: Self-pay | Admitting: Emergency Medicine

## 2020-03-30 ENCOUNTER — Emergency Department (HOSPITAL_COMMUNITY)
Admission: EM | Admit: 2020-03-30 | Discharge: 2020-03-30 | Disposition: A | Discharge status: Home / Self Care | Payer: Medicare Other | Attending: Emergency Medicine | Admitting: Emergency Medicine

## 2020-03-30 ENCOUNTER — Other Ambulatory Visit: Payer: Self-pay

## 2020-03-30 DIAGNOSIS — Z765 Malingerer [conscious simulation]: Secondary | ICD-10-CM

## 2020-03-30 DIAGNOSIS — M25562 Pain in left knee: Secondary | ICD-10-CM

## 2020-03-30 DIAGNOSIS — Z590 Homelessness: Secondary | ICD-10-CM | POA: Diagnosis not present

## 2020-03-30 DIAGNOSIS — Z79899 Other long term (current) drug therapy: Secondary | ICD-10-CM | POA: Insufficient documentation

## 2020-03-30 DIAGNOSIS — F17210 Nicotine dependence, cigarettes, uncomplicated: Secondary | ICD-10-CM | POA: Insufficient documentation

## 2020-03-30 DIAGNOSIS — G8929 Other chronic pain: Secondary | ICD-10-CM | POA: Diagnosis present

## 2020-03-30 DIAGNOSIS — Z7982 Long term (current) use of aspirin: Secondary | ICD-10-CM | POA: Insufficient documentation

## 2020-03-30 DIAGNOSIS — I110 Hypertensive heart disease with heart failure: Secondary | ICD-10-CM | POA: Diagnosis not present

## 2020-03-30 DIAGNOSIS — I5030 Unspecified diastolic (congestive) heart failure: Secondary | ICD-10-CM | POA: Diagnosis not present
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
## 2020-03-30 NOTE — ED Triage Notes (Signed)
Patient reports nasal congestion. Here every day.

## 2020-03-30 NOTE — ED Triage Notes (Signed)
Nasal congestion.  

## 2020-03-31 ENCOUNTER — Encounter (HOSPITAL_COMMUNITY): Payer: Self-pay | Admitting: Emergency Medicine

## 2020-03-31 ENCOUNTER — Emergency Department (HOSPITAL_COMMUNITY)
Admission: EM | Admit: 2020-03-31 | Discharge: 2020-04-01 | Disposition: A | Discharge status: Home / Self Care | Payer: Medicare Other | Attending: Emergency Medicine | Admitting: Emergency Medicine

## 2020-03-31 ENCOUNTER — Other Ambulatory Visit: Payer: Self-pay

## 2020-03-31 DIAGNOSIS — Z5321 Procedure and treatment not carried out due to patient leaving prior to being seen by health care provider: Secondary | ICD-10-CM | POA: Diagnosis not present

## 2020-03-31 DIAGNOSIS — M25562 Pain in left knee: Secondary | ICD-10-CM | POA: Insufficient documentation

## 2020-03-31 MED ORDER — ACETAMINOPHEN 325 MG PO TABS
650.0000 mg | ORAL_TABLET | Freq: Once | ORAL | Status: AC
  Administered 2020-03-31: 650 mg via ORAL
  Filled 2020-03-31: qty 2

## 2020-03-31 NOTE — ED Triage Notes (Signed)
Pt c/o left knee pain.

## 2020-03-31 NOTE — Discharge Instructions (Addendum)
Ice your knee for pain. Wear the ace wrap

## 2020-03-31 NOTE — ED Notes (Signed)
Patient verbalizes understanding of discharge instructions . Opportunity for questions and answers were provided . Armband removed by staff ,Pt discharged from ED. W/C  offered at D/C  and Declined W/C at D/C and was escorted to lobby by RN.  

## 2020-03-31 NOTE — ED Provider Notes (Signed)
[facility] EMERGENCY DEPARTMENT Provider Note   CSN: [id number] Arrival date & time: 03/30/20  2138     History Chief Complaint  Patient presents with   Knee Pain    [name] is a 69 y.o. male with past medical history sniffing for BPH, depression, GERD, hepatitis B, hyperlipidemia, hypertension, paranoid schizophrenia, retinal vein occlusion.  HPI Patient presents to emergency department today with chief complaint of left knee pain x2 months.  Triage note states patient was here for nasal congestion however he denies that is why he checked in.  Patient states the pain started after bumping his knee on the bed.  He states the pain is constant and is worse with ambulation.  No medications for symptoms prior to arrival.    Past Medical History:  Diagnosis Date   BPH (benign prostatic hyperplasia)    Colon polyp 2010   Depression    GERD (gastroesophageal reflux disease)    Hepatitis C    "caught it when I had a blood transfusion"   High cholesterol    History of blood transfusion    "when I was young"   Hypertension    Paranoid schizophrenia ([place])    Prostate atrophy    Retinal vein occlusion     Patient Active Problem List   Diagnosis Date Noted   GERD (gastroesophageal reflux disease) 05/03/2019   Hypotension 08/02/2018   Low back sprain, initial encounter 10/21/2017   History of drug abuse ([place]) 10/18/2017   Gingival erythema 09/23/2017   Chronic pain of left knee 11/24/2016   Tobacco abuse    Diastolic heart failure ([place]) 02/05/2015   Healthcare maintenance 05/28/2014   Hyperlipidemia 03/10/2014   Patellar tendonitis 11/16/2012   BRANCH RETINAL VEIN OCCLUSION 06/03/2010   PARANOID SCHIZOPHRENIA, CHRONIC 01/17/2009   HYPERTENSION, BENIGN ESSENTIAL 01/17/2009   BPH (benign prostatic hyperplasia) 01/17/2009   HEPATITIS C 12/14/2008   DEPRESSION 12/14/2008    Past Surgical History:  Procedure Laterality Date    CIRCUMCISION  1959   TONSILLECTOMY         Family History  Problem Relation Age of Onset   Hypertension Mother    Diabetes Mother    Stroke Mother     Social History   Tobacco Use   Smoking status: Current Every Day Smoker    Packs/day: 0.50    Years: 48.00    Pack years: 24.00    Types: Cigarettes   Smokeless tobacco: Never Used  Substance Use Topics   Alcohol use: Yes   Drug use: No    Home Medications Prior to Admission medications   Medication Sig Start Date End Date Taking? Authorizing Provider  amLODipine (NORVASC) 10 MG tablet Take 1 tablet (10 mg total) by mouth daily. 04/13/18   [name], DO  aspirin 81 MG tablet Take 1 tablet (81 mg total) by mouth daily. 09/24/14   [name], MD  atorvastatin (LIPITOR) 40 MG tablet Take 1 tablet (40 mg total) by mouth daily. 08/15/19   [name], DO  benztropine (COGENTIN) 1 MG tablet Take 1 tablet (1 mg total) by mouth 2 (two) times daily. 02/26/20   Salley Slaughter, NP  cetirizine (ZYRTEC ALLERGY) 10 MG tablet Take 1 tablet (10 mg total) by mouth daily. 02/01/20   [name], [name], PA-C  famotidine (PEPCID) 20 MG tablet Take 1 tablet (20 mg total) by mouth 2 (two) times daily. 05/02/19   [name], DO  fluPHENAZine decanoate (PROLIXIN) 25 MG/ML  injection Inject 1 mL (25 mg total) into the muscle every 14 (fourteen) days. Last dose 09/07/12 02/26/20   Salley Slaughter, NP  fluticasone (FLONASE) 50 MCG/ACT nasal spray Place 2 sprays into both nostrils daily. 03/17/20   Couture, Cortni S, PA-C  hydrOXYzine (VISTARIL) 50 MG capsule Take 50 mg by mouth at bedtime. 02/01/20   [provider]  losartan (COZAAR) 100 MG tablet Take 1 tablet (100 mg total) by mouth daily. 11/28/19   Tammi Klippel, Sherin, DO  mometasone (NASONEX) 50 MCG/ACT nasal spray Place 2 sprays into the nose daily. 12/29/19   [name], PA-C  Multiple Vitamin (MULTIVITAMIN WITH MINERALS) TABS tablet Take 1 tablet by mouth  daily. 06/28/14   [name], MD  sodium chloride (OCEAN) 0.65 % SOLN nasal spray Place 1 spray into both nostrils as needed for congestion. 11/19/19   [name], PA-C  tamsulosin (FLOMAX) 0.4 MG CAPS capsule TAKE 2 CAPSULE BY MOUTH DAILY Patient taking differently: Take 0.8 mg by mouth daily.  02/23/20   Mullis, Kiersten P, DO  traMADol (ULTRAM) 50 MG tablet Take 1 tablet (50 mg total) by mouth every 6 (six) hours as needed. 07/04/18   [name], MD  triamcinolone cream (KENALOG) 0.1 % Apply 1 application topically 2 (two) times daily. For 5 days, then twice daily as needed for rash and itching. 01/03/15   [name], MD  vitamin C (ASCORBIC ACID) 500 MG tablet Take 500 mg by mouth daily.    [provider]  loratadine (CLARITIN) 10 MG tablet Take 1 tablet (10 mg total) by mouth daily. 12/28/19 02/05/20  [name], DO    Allergies    Lisinopril, Penicillins, Amoxicillin, and Ibuprofen  Review of Systems   Review of Systems  All other systems reviewed and are negative.   Physical Exam Updated Vital Signs BP (!) 155/94 (BP Location: Left Arm)    Pulse 65    Temp 98.6 F (37 C) (Oral)    Resp 18    Ht 5\' 9"  (1.753 m)    Wt 67.1 kg    SpO2 100%    BMI 21.85 kg/m   Physical Exam Vitals and nursing note reviewed.  Constitutional:      Appearance: He is well-developed. He is not ill-appearing or toxic-appearing.     Comments: Mumbling speech  Appears disheveled   HENT:     Head: Normocephalic and atraumatic.     Comments: No sinus or temporal tenderness.    Nose: Nose normal.  Eyes:     General: No scleral icterus.       Right eye: No discharge.        Left eye: No discharge.     Conjunctiva/sclera: Conjunctivae normal.  Neck:     Vascular: No JVD.  Cardiovascular:     Rate and Rhythm: Normal rate and regular rhythm.     Pulses: Normal pulses.     Heart sounds: Normal heart sounds.  Pulmonary:     Effort: Pulmonary effort is normal.     Breath  sounds: Normal breath sounds.  Abdominal:     General: There is no distension.  Musculoskeletal:        General: Normal range of motion.     Cervical back: Normal range of motion.     Comments: Full ROM of left hip, knee, ankle. No swelling, effusion, deformity, crepitus of knee. Compartments soft in left lower extremity.  Ambulatory with steady gait  Skin:  General: Skin is warm and dry.     Comments: Equal tactile temperature in all extremities.  Neurological:     Mental Status: He is oriented to person, place, and time.     GCS: GCS eye subscore is 4. GCS verbal subscore is 5. GCS motor subscore is 6.     Comments: Fluent speech, no facial droop.  Psychiatric:        Behavior: Behavior normal.      ED Results / Procedures / Treatments   Labs (all labs ordered are listed, but only abnormal results are displayed) Labs Reviewed - No data to display  EKG None  Radiology CT Head Wo Contrast  Result Date: 03/29/2020 CLINICAL DATA:  Altered mental status EXAM: CT HEAD WITHOUT CONTRAST TECHNIQUE: Contiguous axial images were obtained from the base of the skull through the vertex without intravenous contrast. COMPARISON:  None. FINDINGS: Brain: Mild atrophic changes and chronic white matter ischemic change is seen. Prior left parietal infarct is noted. No findings to suggest acute hemorrhage, acute infarction or space-occupying mass lesion are noted. Vascular: No hyperdense vessel or unexpected calcification. Skull: Normal. Negative for fracture or focal lesion. Sinuses/Orbits: No acute finding. Other: None. IMPRESSION: Chronic atrophic and ischemic changes without acute abnormality. Electronically Signed   By: Inez Catalina M.D.   On: 03/29/2020 09:10    Procedures Procedures (including critical care time)  Medications Ordered in ED Medications  acetaminophen (TYLENOL) tablet 650 mg (has no administration in time range)    ED Course  I have reviewed the triage vital signs and  the nursing notes.  Pertinent labs & imaging results that were available during my care of the patient were reviewed by me and considered in my medical decision making (see chart for details).    MDM Rules/Calculators/A&P                          History provided by patient with additional history obtained from chart review.    Patient is well-known to the emergency department.  He has had 119 visits in the last 6 months.  He is here today for knee pain.  This is chronic. MSK exam benign. He is ambulatory with steady gait. No exam findings to suggest fracture or dislocation or septic joint. I did review his recent ED work ups, he had normal labs with the exception of creatinine that was elevated however is consistent with his baseline.  Discharge home with symptomatic care. No further emergent work up necessary at this time. Strict return precautions discussed.    Portions of this note were generated with [employer]. Dictation errors may occur despite best attempts at proofreading.  Final Clinical Impression(s) / ED Diagnoses Final diagnoses:  None    Rx / DC Orders ED Discharge Orders    None       Flint Melter 03/31/20 0827    Dorie Rank, MD 04/01/20 [phone]

## 2020-04-01 ENCOUNTER — Other Ambulatory Visit: Payer: Self-pay

## 2020-04-01 ENCOUNTER — Emergency Department (HOSPITAL_COMMUNITY)
Admission: EM | Admit: 2020-04-01 | Discharge: 2020-04-02 | Discharge status: Against Medical Advice | Payer: Medicare Other

## 2020-04-01 ENCOUNTER — Encounter (HOSPITAL_COMMUNITY): Payer: Self-pay | Admitting: Emergency Medicine

## 2020-04-01 NOTE — ED Notes (Signed)
Saw pt leaving waiting room and walking up towards bus stop. Advised pt to stay but pt continued to walk. Moving pt OTF.

## 2020-04-01 NOTE — ED Triage Notes (Signed)
Pt c/o L knee pain from walking all day. Denies injury

## 2020-04-01 NOTE — ED Notes (Signed)
Pt left emergency department

## 2020-04-03 ENCOUNTER — Emergency Department (HOSPITAL_COMMUNITY)
Admission: EM | Admit: 2020-04-03 | Discharge: 2020-04-03 | Disposition: A | Discharge status: Home / Self Care | Payer: Medicare Other | Attending: Emergency Medicine | Admitting: Emergency Medicine

## 2020-04-03 DIAGNOSIS — M25562 Pain in left knee: Secondary | ICD-10-CM | POA: Insufficient documentation

## 2020-04-03 DIAGNOSIS — I10 Essential (primary) hypertension: Secondary | ICD-10-CM | POA: Insufficient documentation

## 2020-04-03 DIAGNOSIS — G8929 Other chronic pain: Secondary | ICD-10-CM | POA: Diagnosis not present

## 2020-04-03 DIAGNOSIS — F17210 Nicotine dependence, cigarettes, uncomplicated: Secondary | ICD-10-CM | POA: Insufficient documentation

## 2020-04-03 NOTE — ED Notes (Signed)
Pt ambulated to restroom without difficulty

## 2020-04-03 NOTE — ED Triage Notes (Signed)
Patient reports knee pain. Here every day

## 2020-04-03 NOTE — Discharge Instructions (Addendum)
You were evaluated in the Emergency Department and after careful evaluation, we did not find any emergent condition requiring admission or further testing in the hospital.  Your exam/testing today is overall reassuring. Your symptoms seem to be due to arthritis. Please follow-up with an orthopedic specialist.  Please return to the Emergency Department if you experience any worsening of your condition.   Thank you for allowing [place] to be a part of your care.

## 2020-04-04 ENCOUNTER — Emergency Department (HOSPITAL_COMMUNITY)
Admission: EM | Admit: 2020-04-04 | Discharge: 2020-04-05 | Disposition: A | Discharge status: Home / Self Care | Payer: Medicare Other | Attending: Emergency Medicine | Admitting: Emergency Medicine

## 2020-04-04 ENCOUNTER — Encounter (HOSPITAL_COMMUNITY): Payer: Self-pay | Admitting: Emergency Medicine

## 2020-04-04 ENCOUNTER — Other Ambulatory Visit: Payer: Self-pay

## 2020-04-04 ENCOUNTER — Emergency Department (HOSPITAL_COMMUNITY)
Admission: EM | Admit: 2020-04-04 | Discharge: 2020-04-04 | Discharge status: Against Medical Advice | Payer: Medicare Other

## 2020-04-04 DIAGNOSIS — I5030 Unspecified diastolic (congestive) heart failure: Secondary | ICD-10-CM | POA: Insufficient documentation

## 2020-04-04 DIAGNOSIS — F17210 Nicotine dependence, cigarettes, uncomplicated: Secondary | ICD-10-CM | POA: Insufficient documentation

## 2020-04-04 DIAGNOSIS — I110 Hypertensive heart disease with heart failure: Secondary | ICD-10-CM | POA: Diagnosis not present

## 2020-04-04 DIAGNOSIS — Z79899 Other long term (current) drug therapy: Secondary | ICD-10-CM | POA: Insufficient documentation

## 2020-04-04 DIAGNOSIS — Z7982 Long term (current) use of aspirin: Secondary | ICD-10-CM | POA: Diagnosis not present

## 2020-04-04 DIAGNOSIS — M25562 Pain in left knee: Secondary | ICD-10-CM | POA: Diagnosis present

## 2020-04-04 NOTE — ED Triage Notes (Signed)
Pt c/o L knee pain after hitting in on a rail 2 days ago.

## 2020-04-04 NOTE — ED Triage Notes (Signed)
Pt c/o chronic knee pain.

## 2020-04-05 ENCOUNTER — Emergency Department (HOSPITAL_COMMUNITY)
Admission: EM | Admit: 2020-04-05 | Discharge: 2020-04-06 | Disposition: A | Discharge status: Home / Self Care | Payer: Medicare Other | Attending: Emergency Medicine | Admitting: Emergency Medicine

## 2020-04-05 ENCOUNTER — Encounter (HOSPITAL_COMMUNITY): Payer: Self-pay

## 2020-04-05 ENCOUNTER — Encounter (HOSPITAL_COMMUNITY): Payer: Self-pay | Admitting: Emergency Medicine

## 2020-04-05 DIAGNOSIS — Z5321 Procedure and treatment not carried out due to patient leaving prior to being seen by health care provider: Secondary | ICD-10-CM | POA: Diagnosis not present

## 2020-04-05 DIAGNOSIS — M25561 Pain in right knee: Secondary | ICD-10-CM | POA: Insufficient documentation

## 2020-04-05 NOTE — ED Provider Notes (Signed)
[facility] EMERGENCY DEPARTMENT Provider Note   CSN: [id number] Arrival date & time: 04/04/20  2200     History Chief Complaint  Patient presents with  . Knee Injury    [name] is a 69 y.o. male.  The history is provided by the patient.  Knee Pain Location:  Knee Injury: no   Knee location:  L knee Pain details:    Quality:  Aching   Radiates to:  Does not radiate   Severity:  Mild   Onset quality:  Gradual   Timing:  Constant   Progression:  Unchanged Chronicity:  Chronic Dislocation: no   Foreign body present:  No foreign bodies Relieved by:  Nothing Ineffective treatments:  None tried Associated symptoms: no back pain, no decreased ROM, no fatigue, no fever, no itching, no muscle weakness, no neck pain, no numbness, no stiffness, no swelling and no tingling   Risk factors: no concern for non-accidental trauma        Past Medical History:  Diagnosis Date  . BPH (benign prostatic hyperplasia)   . Colon polyp 2010  . Depression   . GERD (gastroesophageal reflux disease)   . Hepatitis C    "caught it when I had a blood transfusion"  . High cholesterol   . History of blood transfusion    "when I was young"  . Hypertension   . Paranoid schizophrenia ([place])   . Prostate atrophy   . Retinal vein occlusion     Patient Active Problem List   Diagnosis Date Noted  . GERD (gastroesophageal reflux disease) 05/03/2019  . Hypotension 08/02/2018  . Low back sprain, initial encounter 10/21/2017  . History of drug abuse ([place]) 10/18/2017  . Gingival erythema 09/23/2017  . Chronic pain of left knee 11/24/2016  . Tobacco abuse   . Diastolic heart failure ([place]) 02/05/2015  . Healthcare maintenance 05/28/2014  . Hyperlipidemia 03/10/2014  . Patellar tendonitis 11/16/2012  . [place] OCCLUSION 06/03/2010  . PARANOID SCHIZOPHRENIA, CHRONIC 01/17/2009  . HYPERTENSION, BENIGN ESSENTIAL 01/17/2009  . BPH (benign prostatic hyperplasia)  01/17/2009  . HEPATITIS C 12/14/2008  . DEPRESSION 12/14/2008    Past Surgical History:  Procedure Laterality Date  . CIRCUMCISION  1959  . TONSILLECTOMY         Family History  Problem Relation Age of Onset  . Hypertension Mother   . Diabetes Mother   . Stroke Mother     Social History   Tobacco Use  . Smoking status: Current Every Day Smoker    Packs/day: 0.50    Years: 48.00    Pack years: 24.00    Types: Cigarettes  . Smokeless tobacco: Never Used  Substance Use Topics  . Alcohol use: Yes  . Drug use: No    Home Medications Prior to Admission medications   Medication Sig Start Date End Date Taking? Authorizing Provider  amLODipine (NORVASC) 10 MG tablet Take 1 tablet (10 mg total) by mouth daily. 04/13/18   [name], DO  aspirin 81 MG tablet Take 1 tablet (81 mg total) by mouth daily. 09/24/14   [name], MD  atorvastatin (LIPITOR) 40 MG tablet Take 1 tablet (40 mg total) by mouth daily. 08/15/19   [name], DO  benztropine (COGENTIN) 1 MG tablet Take 1 tablet (1 mg total) by mouth 2 (two) times daily. 02/26/20   Salley Slaughter, NP  cetirizine (ZYRTEC ALLERGY) 10 MG tablet Take 1 tablet (10 mg total) by mouth  daily. 02/01/20   Rodell Perna A, PA-C  famotidine (PEPCID) 20 MG tablet Take 1 tablet (20 mg total) by mouth 2 (two) times daily. 05/02/19   [name], DO  fluPHENAZine decanoate (PROLIXIN) 25 MG/ML injection Inject 1 mL (25 mg total) into the muscle every 14 (fourteen) days. Last dose 09/07/12 02/26/20   Salley Slaughter, NP  fluticasone (FLONASE) 50 MCG/ACT nasal spray Place 2 sprays into both nostrils daily. 03/17/20   Couture, Cortni S, PA-C  hydrOXYzine (VISTARIL) 50 MG capsule Take 50 mg by mouth at bedtime. 02/01/20   [provider]  losartan (COZAAR) 100 MG tablet Take 1 tablet (100 mg total) by mouth daily. 11/28/19   Tammi Klippel, Sherin, DO  mometasone (NASONEX) 50 MCG/ACT nasal spray Place 2 sprays into the nose  daily. 12/29/19   [name], PA-C  Multiple Vitamin (MULTIVITAMIN WITH MINERALS) TABS tablet Take 1 tablet by mouth daily. 06/28/14   [name], MD  sodium chloride (OCEAN) 0.65 % SOLN nasal spray Place 1 spray into both nostrils as needed for congestion. 11/19/19   [name], PA-C  tamsulosin (FLOMAX) 0.4 MG CAPS capsule TAKE 2 CAPSULE BY MOUTH DAILY Patient taking differently: Take 0.8 mg by mouth daily.  02/23/20   Mullis, Kiersten P, DO  traMADol (ULTRAM) 50 MG tablet Take 1 tablet (50 mg total) by mouth every 6 (six) hours as needed. 07/04/18   [name], MD  triamcinolone cream (KENALOG) 0.1 % Apply 1 application topically 2 (two) times daily. For 5 days, then twice daily as needed for rash and itching. 01/03/15   [name], MD  vitamin C (ASCORBIC ACID) 500 MG tablet Take 500 mg by mouth daily.    [provider]  loratadine (CLARITIN) 10 MG tablet Take 1 tablet (10 mg total) by mouth daily. 12/28/19 02/05/20  [name], DO    Allergies    Lisinopril, Penicillins, Amoxicillin, and Ibuprofen  Review of Systems   Review of Systems  Constitutional: Negative for fatigue and fever.  HENT: Negative for congestion.   Eyes: Negative for visual disturbance.  Respiratory: Negative for shortness of breath.   Gastrointestinal: Negative for abdominal pain.  Genitourinary: Negative for difficulty urinating.  Musculoskeletal: Negative for back pain, neck pain and stiffness.  Skin: Negative for itching and rash.  Neurological: Negative for dizziness.  Psychiatric/Behavioral: Negative for agitation.  All other systems reviewed and are negative.   Physical Exam Updated Vital Signs BP 138/84   Pulse 69   Temp 98 F (36.7 C) (Oral)   Resp 18   Ht 5\' 9"  (1.753 m)   Wt 67 kg   SpO2 99%   BMI 21.81 kg/m   Physical Exam Vitals and nursing note reviewed.  Constitutional:      General: He is not in acute distress.    Appearance: Normal appearance.    HENT:     Head: Normocephalic and atraumatic.     Nose: Nose normal.  Eyes:     Conjunctiva/sclera: Conjunctivae normal.     Pupils: Pupils are equal, round, and reactive to light.  Cardiovascular:     Rate and Rhythm: Normal rate and regular rhythm.     Pulses: Normal pulses.     Heart sounds: Normal heart sounds.  Pulmonary:     Effort: Pulmonary effort is normal.     Breath sounds: Normal breath sounds.  Abdominal:     General: Abdomen is flat. Bowel sounds are normal.  Tenderness: There is no abdominal tenderness. There is no guarding.  Musculoskeletal:        General: Normal range of motion.     Cervical back: Normal range of motion and neck supple.  Skin:    General: Skin is warm and dry.     Capillary Refill: Capillary refill takes less than 2 seconds.  Neurological:     General: No focal deficit present.     Mental Status: He is alert and oriented to person, place, and time.     Deep Tendon Reflexes: Reflexes normal.  Psychiatric:        Mood and Affect: Mood normal.        Behavior: Behavior normal.     ED Results / Procedures / Treatments   Labs (all labs ordered are listed, but only abnormal results are displayed) Labs Reviewed - No data to display  EKG None  Radiology No results found.  Procedures Procedures (including critical care time)  Medications Ordered in ED Medications - No data to display  ED Course  I have reviewed the triage vital signs and the nursing notes.  Pertinent labs & imaging results that were available during my care of the patient were reviewed by me and considered in my medical decision making (see chart for details).    Well appearing.  Well known to the ED for this same thing.  Stable for discharge.   [name] was evaluated in Emergency Department on 04/05/2020 for the symptoms described in the history of present illness. He was evaluated in the context of the global COVID-19 pandemic, which necessitated  consideration that the patient might be at risk for infection with the SARS-CoV-2 virus that causes COVID-19. Institutional protocols and algorithms that pertain to the evaluation of patients at risk for COVID-19 are in a state of rapid change based on information released by regulatory bodies including the CDC and federal and state organizations. These policies and algorithms were followed during the patient's care in the ED.  Final Clinical Impression(s) / ED Diagnoses Return for intractable cough, coughing up blood,fevers >100.4 unrelieved by medication, shortness of breath, intractable vomiting, chest pain, shortness of breath, weakness,numbness, changes in speech, facial asymmetry,abdominal pain, passing out,Inability to tolerate liquids or food, cough, altered mental status or any concerns. No signs of systemic illness or infection. The patient is nontoxic-appearing on exam and vital signs are within normal limits.   I have reviewed the triage vital signs and the nursing notes. Pertinent labs &imaging results that were available during my care of the patient were reviewed by me and considered in my medical decision making (see chart for details).After history, exam, and medical workup I feel the patient has beenappropriately medically screened and is safe for discharge home. Pertinent diagnoses were discussed with the patient. Patient was given return precautions.   Starr Urias, MD 04/05/20 [phone]

## 2020-04-05 NOTE — ED Triage Notes (Signed)
Pt arrives POV for eval for R knee pain

## 2020-04-06 ENCOUNTER — Emergency Department (HOSPITAL_COMMUNITY)
Admission: EM | Admit: 2020-04-06 | Discharge: 2020-04-07 | Disposition: A | Discharge status: Home / Self Care | Payer: Medicare Other | Source: Home / Self Care

## 2020-04-06 DIAGNOSIS — Z5321 Procedure and treatment not carried out due to patient leaving prior to being seen by health care provider: Secondary | ICD-10-CM | POA: Insufficient documentation

## 2020-04-06 DIAGNOSIS — M25562 Pain in left knee: Secondary | ICD-10-CM | POA: Insufficient documentation

## 2020-04-06 DIAGNOSIS — M25561 Pain in right knee: Secondary | ICD-10-CM | POA: Diagnosis not present

## 2020-04-06 NOTE — ED Notes (Signed)
Called pt x2 for vitals, no response. °

## 2020-04-07 ENCOUNTER — Encounter (HOSPITAL_COMMUNITY): Payer: Self-pay | Admitting: *Deleted

## 2020-04-07 ENCOUNTER — Other Ambulatory Visit: Payer: Self-pay

## 2020-04-07 ENCOUNTER — Encounter (HOSPITAL_COMMUNITY): Payer: Self-pay

## 2020-04-07 ENCOUNTER — Emergency Department (HOSPITAL_COMMUNITY)
Admission: EM | Admit: 2020-04-07 | Discharge: 2020-04-08 | Disposition: A | Discharge status: Home / Self Care | Payer: Medicare Other | Attending: Emergency Medicine | Admitting: Emergency Medicine

## 2020-04-07 DIAGNOSIS — Z7982 Long term (current) use of aspirin: Secondary | ICD-10-CM | POA: Diagnosis not present

## 2020-04-07 DIAGNOSIS — G8929 Other chronic pain: Secondary | ICD-10-CM | POA: Diagnosis not present

## 2020-04-07 DIAGNOSIS — Z79899 Other long term (current) drug therapy: Secondary | ICD-10-CM | POA: Insufficient documentation

## 2020-04-07 DIAGNOSIS — M25562 Pain in left knee: Secondary | ICD-10-CM | POA: Diagnosis not present

## 2020-04-07 DIAGNOSIS — I10 Essential (primary) hypertension: Secondary | ICD-10-CM | POA: Insufficient documentation

## 2020-04-07 DIAGNOSIS — F17210 Nicotine dependence, cigarettes, uncomplicated: Secondary | ICD-10-CM | POA: Insufficient documentation

## 2020-04-07 DIAGNOSIS — M25561 Pain in right knee: Secondary | ICD-10-CM | POA: Diagnosis not present

## 2020-04-07 NOTE — ED Triage Notes (Signed)
To ED for eval of left knee pain. Ambulatory wnl.

## 2020-04-07 NOTE — ED Triage Notes (Signed)
C/o left knee pain.

## 2020-04-08 ENCOUNTER — Ambulatory Visit (HOSPITAL_COMMUNITY): Payer: Medicare Other | Admitting: *Deleted

## 2020-04-08 ENCOUNTER — Other Ambulatory Visit: Payer: Self-pay

## 2020-04-08 ENCOUNTER — Encounter (HOSPITAL_COMMUNITY): Payer: Self-pay

## 2020-04-08 VITALS — BP 148/88 | HR 69 | Temp 98.5°F | Wt 127.0 lb

## 2020-04-08 DIAGNOSIS — F200 Paranoid schizophrenia: Secondary | ICD-10-CM

## 2020-04-08 DIAGNOSIS — M25562 Pain in left knee: Secondary | ICD-10-CM | POA: Diagnosis not present

## 2020-04-08 DIAGNOSIS — M79604 Pain in right leg: Secondary | ICD-10-CM | POA: Insufficient documentation

## 2020-04-08 DIAGNOSIS — G8929 Other chronic pain: Secondary | ICD-10-CM | POA: Insufficient documentation

## 2020-04-08 DIAGNOSIS — Z5321 Procedure and treatment not carried out due to patient leaving prior to being seen by health care provider: Secondary | ICD-10-CM | POA: Insufficient documentation

## 2020-04-08 DIAGNOSIS — M79605 Pain in left leg: Secondary | ICD-10-CM | POA: Insufficient documentation

## 2020-04-08 NOTE — ED Provider Notes (Signed)
[facility] EMERGENCY DEPARTMENT Provider Note   CSN: [id number] Arrival date & time: 04/07/20  2143     History Chief Complaint  Patient presents with  . Knee Pain    [name] is a 69 y.o. male.   Knee Pain Location:  Knee Injury: no   Knee location:  L knee Pain details:    Quality:  Aching and sharp   Radiates to:  Does not radiate   Severity:  Mild   Timing:  Constant Chronicity:  New Relieved by:  Acetaminophen Worsened by:  Bearing weight Associated symptoms: no back pain, no decreased ROM, no fever, no itching, no neck pain, no numbness, no swelling and no tingling        Past Medical History:  Diagnosis Date  . BPH (benign prostatic hyperplasia)   . Colon polyp 2010  . Depression   . GERD (gastroesophageal reflux disease)   . Hepatitis C    "caught it when I had a blood transfusion"  . High cholesterol   . History of blood transfusion    "when I was young"  . Hypertension   . Paranoid schizophrenia ([place])   . Prostate atrophy   . Retinal vein occlusion     Patient Active Problem List   Diagnosis Date Noted  . GERD (gastroesophageal reflux disease) 05/03/2019  . Hypotension 08/02/2018  . Low back sprain, initial encounter 10/21/2017  . History of drug abuse ([place]) 10/18/2017  . Gingival erythema 09/23/2017  . Chronic pain of left knee 11/24/2016  . Tobacco abuse   . Diastolic heart failure ([place]) 02/05/2015  . Healthcare maintenance 05/28/2014  . Hyperlipidemia 03/10/2014  . Patellar tendonitis 11/16/2012  . [place] OCCLUSION 06/03/2010  . PARANOID SCHIZOPHRENIA, CHRONIC 01/17/2009  . HYPERTENSION, BENIGN ESSENTIAL 01/17/2009  . BPH (benign prostatic hyperplasia) 01/17/2009  . HEPATITIS C 12/14/2008  . DEPRESSION 12/14/2008    Past Surgical History:  Procedure Laterality Date  . CIRCUMCISION  1959  . TONSILLECTOMY         Family History  Problem Relation Age of Onset  . Hypertension Mother   .  Diabetes Mother   . Stroke Mother     Social History   Tobacco Use  . Smoking status: Current Every Day Smoker    Packs/day: 0.50    Years: 48.00    Pack years: 24.00    Types: Cigarettes  . Smokeless tobacco: Never Used  Substance Use Topics  . Alcohol use: Yes  . Drug use: No    Home Medications Prior to Admission medications   Medication Sig Start Date End Date Taking? Authorizing Provider  amLODipine (NORVASC) 10 MG tablet Take 1 tablet (10 mg total) by mouth daily. 04/13/18   [name], DO  aspirin 81 MG tablet Take 1 tablet (81 mg total) by mouth daily. 09/24/14   [name], MD  atorvastatin (LIPITOR) 40 MG tablet Take 1 tablet (40 mg total) by mouth daily. 08/15/19   [name], DO  benztropine (COGENTIN) 1 MG tablet Take 1 tablet (1 mg total) by mouth 2 (two) times daily. 02/26/20   Salley Slaughter, NP  cetirizine (ZYRTEC ALLERGY) 10 MG tablet Take 1 tablet (10 mg total) by mouth daily. 02/01/20   [name], [name], PA-C  famotidine (PEPCID) 20 MG tablet Take 1 tablet (20 mg total) by mouth 2 (two) times daily. 05/02/19   [name], DO  fluPHENAZine decanoate (PROLIXIN) 25 MG/ML injection Inject 1 mL (25  mg total) into the muscle every 14 (fourteen) days. Last dose 09/07/12 02/26/20   Salley Slaughter, NP  fluticasone (FLONASE) 50 MCG/ACT nasal spray Place 2 sprays into both nostrils daily. 03/17/20   Couture, Cortni S, PA-C  hydrOXYzine (VISTARIL) 50 MG capsule Take 50 mg by mouth at bedtime. 02/01/20   [provider]  losartan (COZAAR) 100 MG tablet Take 1 tablet (100 mg total) by mouth daily. 11/28/19   Tammi Klippel, Sherin, DO  mometasone (NASONEX) 50 MCG/ACT nasal spray Place 2 sprays into the nose daily. 12/29/19   [name], PA-C  Multiple Vitamin (MULTIVITAMIN WITH MINERALS) TABS tablet Take 1 tablet by mouth daily. 06/28/14   [name], MD  sodium chloride (OCEAN) 0.65 % SOLN nasal spray Place 1 spray into both nostrils as  needed for congestion. 11/19/19   [name], PA-C  tamsulosin (FLOMAX) 0.4 MG CAPS capsule TAKE 2 CAPSULE BY MOUTH DAILY Patient taking differently: Take 0.8 mg by mouth daily.  02/23/20   Mullis, Kiersten P, DO  traMADol (ULTRAM) 50 MG tablet Take 1 tablet (50 mg total) by mouth every 6 (six) hours as needed. 07/04/18   [name], MD  triamcinolone cream (KENALOG) 0.1 % Apply 1 application topically 2 (two) times daily. For 5 days, then twice daily as needed for rash and itching. 01/03/15   [name], MD  vitamin C (ASCORBIC ACID) 500 MG tablet Take 500 mg by mouth daily.    [provider]  loratadine (CLARITIN) 10 MG tablet Take 1 tablet (10 mg total) by mouth daily. 12/28/19 02/05/20  [name], DO    Allergies    Lisinopril, Penicillins, Amoxicillin, and Ibuprofen  Review of Systems   Review of Systems  Constitutional: Negative for fever.  Musculoskeletal: Negative for back pain and neck pain.  Skin: Negative for itching.  All other systems reviewed and are negative.   Physical Exam Updated Vital Signs BP (!) 182/98   Pulse 63   Temp 98.6 F (37 C) (Oral)   Resp 16   SpO2 100%   Physical Exam Vitals and nursing note reviewed.  Constitutional:      Appearance: He is well-developed.  HENT:     Head: Normocephalic and atraumatic.     Mouth/Throat:     Mouth: Mucous membranes are moist.     Pharynx: Oropharynx is clear.  Eyes:     Pupils: Pupils are equal, round, and reactive to light.  Cardiovascular:     Rate and Rhythm: Normal rate.  Pulmonary:     Effort: Pulmonary effort is normal. No respiratory distress.  Abdominal:     General: There is no distension.  Musculoskeletal:        General: No swelling, tenderness, deformity or signs of injury. Normal range of motion.     Cervical back: Normal range of motion.     Right lower leg: No edema.     Left lower leg: No edema.  Skin:    General: Skin is warm.  Neurological:     Mental Status: He  is alert.     ED Results / Procedures / Treatments   Labs (all labs ordered are listed, but only abnormal results are displayed) Labs Reviewed - No data to display  EKG None  Radiology No results found.  Procedures Procedures (including critical care time)  Medications Ordered in ED Medications - No data to display  ED Course  I have reviewed the triage vital signs and the  nursing notes.  Pertinent labs & imaging results that were available during my care of the patient were reviewed by me and considered in my medical decision making (see chart for details).    MDM Rules/Calculators/A&P                          Same knee pain as has had for awhile. Low suspicion for acute injury.   Final Clinical Impression(s) / ED Diagnoses Final diagnoses:  Acute pain of left knee    Rx / DC Orders ED Discharge Orders    None       Khaalid Lefkowitz, Corene Cornea, MD 04/08/20 [phone]

## 2020-04-08 NOTE — ED Notes (Signed)
Patient evaluated by EDP at triage .

## 2020-04-08 NOTE — Progress Notes (Signed)
In lobby to rest, not aware today was his injection day. He is often in our waiting room during the day as he is homeless. He is pleasant, speech is more difficult for me to understand, asked if he was tired and he said he was. Spent last HS in the [facility] ED as he often does for sleep and safety. He denies any psychotic sx. He was given food by staff along with water. He was given his injection of Prolixin 25 mg in his L deltoid. Return for next injection on 9/6.

## 2020-04-09 ENCOUNTER — Encounter (HOSPITAL_COMMUNITY): Payer: Self-pay | Admitting: Emergency Medicine

## 2020-04-09 ENCOUNTER — Emergency Department (HOSPITAL_COMMUNITY)
Admission: EM | Admit: 2020-04-09 | Discharge: 2020-04-09 | Disposition: A | Discharge status: Home / Self Care | Payer: Medicare Other | Source: Home / Self Care

## 2020-04-09 ENCOUNTER — Emergency Department (HOSPITAL_COMMUNITY)
Admission: EM | Admit: 2020-04-09 | Discharge: 2020-04-10 | Disposition: A | Discharge status: Home / Self Care | Payer: Medicare Other | Attending: Emergency Medicine | Admitting: Emergency Medicine

## 2020-04-09 DIAGNOSIS — Z7982 Long term (current) use of aspirin: Secondary | ICD-10-CM | POA: Diagnosis not present

## 2020-04-09 DIAGNOSIS — Z792 Long term (current) use of antibiotics: Secondary | ICD-10-CM | POA: Diagnosis not present

## 2020-04-09 DIAGNOSIS — I5030 Unspecified diastolic (congestive) heart failure: Secondary | ICD-10-CM | POA: Insufficient documentation

## 2020-04-09 DIAGNOSIS — F17210 Nicotine dependence, cigarettes, uncomplicated: Secondary | ICD-10-CM | POA: Diagnosis not present

## 2020-04-09 DIAGNOSIS — I10 Essential (primary) hypertension: Secondary | ICD-10-CM | POA: Diagnosis not present

## 2020-04-09 DIAGNOSIS — M25562 Pain in left knee: Secondary | ICD-10-CM | POA: Insufficient documentation

## 2020-04-09 DIAGNOSIS — M25569 Pain in unspecified knee: Secondary | ICD-10-CM | POA: Diagnosis present

## 2020-04-09 NOTE — ED Notes (Signed)
Pt is outside asleep on the bench, doesn't want to get up for vitals

## 2020-04-09 NOTE — ED Triage Notes (Signed)
Patient reports knee pain. Here every night for same.

## 2020-04-09 NOTE — ED Triage Notes (Signed)
Pt c/o chronic leg pain. Ambulating independently in lobby.

## 2020-04-09 NOTE — ED Notes (Signed)
Pt called for vitals inside and outsidex3, no answer

## 2020-04-10 ENCOUNTER — Other Ambulatory Visit: Payer: Self-pay

## 2020-04-10 ENCOUNTER — Emergency Department (HOSPITAL_COMMUNITY)
Admission: EM | Admit: 2020-04-10 | Discharge: 2020-04-10 | Disposition: A | Discharge status: Home / Self Care | Payer: Medicare Other | Source: Home / Self Care | Attending: Emergency Medicine | Admitting: Emergency Medicine

## 2020-04-10 ENCOUNTER — Encounter (HOSPITAL_COMMUNITY): Payer: Self-pay

## 2020-04-10 DIAGNOSIS — F17210 Nicotine dependence, cigarettes, uncomplicated: Secondary | ICD-10-CM | POA: Insufficient documentation

## 2020-04-10 DIAGNOSIS — I5030 Unspecified diastolic (congestive) heart failure: Secondary | ICD-10-CM | POA: Insufficient documentation

## 2020-04-10 DIAGNOSIS — Z79899 Other long term (current) drug therapy: Secondary | ICD-10-CM | POA: Insufficient documentation

## 2020-04-10 DIAGNOSIS — M25562 Pain in left knee: Secondary | ICD-10-CM | POA: Insufficient documentation

## 2020-04-10 DIAGNOSIS — I110 Hypertensive heart disease with heart failure: Secondary | ICD-10-CM | POA: Insufficient documentation

## 2020-04-10 DIAGNOSIS — G8929 Other chronic pain: Secondary | ICD-10-CM

## 2020-04-10 DIAGNOSIS — Z7982 Long term (current) use of aspirin: Secondary | ICD-10-CM | POA: Insufficient documentation

## 2020-04-10 NOTE — ED Triage Notes (Signed)
Knee pain

## 2020-04-10 NOTE — ED Triage Notes (Signed)
Knee Pain

## 2020-04-10 NOTE — Discharge Instructions (Addendum)
Take tylenol for pain, return for worsening pain, fever or other new concerning symptom.

## 2020-04-10 NOTE — ED Provider Notes (Signed)
[facility] EMERGENCY DEPARTMENT Provider Note   CSN: [id number] Arrival date & time: 04/10/20  2059     History Chief Complaint  Patient presents with  . Knee Pain    [name] is a 69 y.o. male.  Presents to ER with concern for knee pain.  He says that he has chronic knee pain, he denies any changes in his knee pain, denies any falls or trauma to the knee, no knee swelling.  No numbness or tingling.  HPI     Past Medical History:  Diagnosis Date  . BPH (benign prostatic hyperplasia)   . Colon polyp 2010  . Depression   . GERD (gastroesophageal reflux disease)   . Hepatitis C    "caught it when I had a blood transfusion"  . High cholesterol   . History of blood transfusion    "when I was young"  . Hypertension   . Paranoid schizophrenia ([place])   . Prostate atrophy   . Retinal vein occlusion     Patient Active Problem List   Diagnosis Date Noted  . GERD (gastroesophageal reflux disease) 05/03/2019  . Hypotension 08/02/2018  . Low back sprain, initial encounter 10/21/2017  . History of drug abuse ([place]) 10/18/2017  . Gingival erythema 09/23/2017  . Chronic pain of left knee 11/24/2016  . Tobacco abuse   . Diastolic heart failure ([place]) 02/05/2015  . Healthcare maintenance 05/28/2014  . Hyperlipidemia 03/10/2014  . Patellar tendonitis 11/16/2012  . [place] OCCLUSION 06/03/2010  . PARANOID SCHIZOPHRENIA, CHRONIC 01/17/2009  . HYPERTENSION, BENIGN ESSENTIAL 01/17/2009  . BPH (benign prostatic hyperplasia) 01/17/2009  . HEPATITIS C 12/14/2008  . DEPRESSION 12/14/2008    Past Surgical History:  Procedure Laterality Date  . CIRCUMCISION  1959  . TONSILLECTOMY         Family History  Problem Relation Age of Onset  . Hypertension Mother   . Diabetes Mother   . Stroke Mother     Social History   Tobacco Use  . Smoking status: Current Every Day Smoker    Packs/day: 0.50    Years: 48.00    Pack years: 24.00    Types:  Cigarettes  . Smokeless tobacco: Never Used  Substance Use Topics  . Alcohol use: Yes  . Drug use: No    Home Medications Prior to Admission medications   Medication Sig Start Date End Date Taking? Authorizing Provider  amLODipine (NORVASC) 10 MG tablet Take 1 tablet (10 mg total) by mouth daily. 04/13/18   [name], DO  aspirin 81 MG tablet Take 1 tablet (81 mg total) by mouth daily. 09/24/14   [name], MD  atorvastatin (LIPITOR) 40 MG tablet Take 1 tablet (40 mg total) by mouth daily. 08/15/19   [name], DO  benztropine (COGENTIN) 1 MG tablet Take 1 tablet (1 mg total) by mouth 2 (two) times daily. 02/26/20   Salley Slaughter, NP  cetirizine (ZYRTEC ALLERGY) 10 MG tablet Take 1 tablet (10 mg total) by mouth daily. 02/01/20   [name], [name], PA-C  famotidine (PEPCID) 20 MG tablet Take 1 tablet (20 mg total) by mouth 2 (two) times daily. 05/02/19   [name], DO  fluPHENAZine decanoate (PROLIXIN) 25 MG/ML injection Inject 1 mL (25 mg total) into the muscle every 14 (fourteen) days. Last dose 09/07/12 02/26/20   Salley Slaughter, NP  fluticasone (FLONASE) 50 MCG/ACT nasal spray Place 2 sprays into both nostrils daily. 03/17/20   Couture,  Cortni S, PA-C  hydrOXYzine (VISTARIL) 50 MG capsule Take 50 mg by mouth at bedtime. 02/01/20   [provider]  losartan (COZAAR) 100 MG tablet Take 1 tablet (100 mg total) by mouth daily. 11/28/19   Tammi Klippel, Sherin, DO  mometasone (NASONEX) 50 MCG/ACT nasal spray Place 2 sprays into the nose daily. 12/29/19   [name], PA-C  Multiple Vitamin (MULTIVITAMIN WITH MINERALS) TABS tablet Take 1 tablet by mouth daily. 06/28/14   [name], MD  sodium chloride (OCEAN) 0.65 % SOLN nasal spray Place 1 spray into both nostrils as needed for congestion. 11/19/19   [name], PA-C  tamsulosin (FLOMAX) 0.4 MG CAPS capsule TAKE 2 CAPSULE BY MOUTH DAILY Patient taking differently: Take 0.8 mg by mouth daily.  02/23/20    Mullis, Kiersten P, DO  traMADol (ULTRAM) 50 MG tablet Take 1 tablet (50 mg total) by mouth every 6 (six) hours as needed. 07/04/18   [name], MD  triamcinolone cream (KENALOG) 0.1 % Apply 1 application topically 2 (two) times daily. For 5 days, then twice daily as needed for rash and itching. 01/03/15   [name], MD  vitamin C (ASCORBIC ACID) 500 MG tablet Take 500 mg by mouth daily.    [provider]  loratadine (CLARITIN) 10 MG tablet Take 1 tablet (10 mg total) by mouth daily. 12/28/19 02/05/20  [name], DO    Allergies    Lisinopril, Penicillins, Amoxicillin, and Ibuprofen  Review of Systems   Review of Systems  All other systems reviewed and are negative.   Physical Exam Updated Vital Signs BP (!) 162/102 (BP Location: Right Arm)   Pulse 71   Temp 98.1 F (36.7 C) (Oral)   Resp 18   SpO2 100%   Physical Exam Vitals and nursing note reviewed.  Constitutional:      Appearance: Normal appearance.  HENT:     Head: Normocephalic and atraumatic.     Nose: Nose normal.  Eyes:     Extraocular Movements: Extraocular movements intact.     Conjunctiva/sclera: Conjunctivae normal.  Cardiovascular:     Rate and Rhythm: Normal rate.     Pulses: Normal pulses.  Pulmonary:     Effort: Pulmonary effort is normal. No respiratory distress.  Musculoskeletal:     Cervical back: Normal range of motion and neck supple.     Comments: Left lower extremity: No tenderness to palpation throughout extremity, no deformity, normal joint range of motion, distal sensation, distal DP and PT pulses intact  Neurological:     Mental Status: He is alert.     ED Results / Procedures / Treatments   Labs (all labs ordered are listed, but only abnormal results are displayed) Labs Reviewed - No data to display  EKG None  Radiology No results found.  Procedures Procedures (including critical care time)  Medications Ordered in ED Medications - No data to display  ED  Course  I have reviewed the triage vital signs and the nursing notes.  Pertinent labs & imaging results that were available during my care of the patient were reviewed by me and considered in my medical decision making (see chart for details).    MDM Rules/Calculators/A&P                         69 year old male presents to ER with concern for left knee pain.  Chronic, no acute change, no deformity noted on physical exam, neurovascular  intact.  Will discharge home, recommend PCP follow-up.    After the discussed management above, the patient was determined to be safe for discharge.  The patient was in agreement with this plan and all questions regarding their care were answered.  ED return precautions were discussed and the patient will return to the ED with any significant worsening of condition.  Final Clinical Impression(s) / ED Diagnoses Final diagnoses:  Chronic pain of left knee    Rx / DC Orders ED Discharge Orders    None       Lucrezia Starch, MD 04/11/20 [phone]

## 2020-04-11 ENCOUNTER — Ambulatory Visit (HOSPITAL_COMMUNITY)
Admission: EM | Admit: 2020-04-11 | Discharge: 2020-04-11 | Disposition: A | Discharge status: Home / Self Care | Payer: Medicare Other

## 2020-04-11 ENCOUNTER — Other Ambulatory Visit: Payer: Self-pay

## 2020-04-11 NOTE — ED Provider Notes (Signed)
Behavioral Health Medical Screening Exam  [name] is a 69 y.o. male who presents voluntarily to [facility]. Patient is well known to behavioral health and area emergency departments. Patient is requesting to take a shower. He denies suicidal ideations. Denies homicidal ideations. Denies auditory and visual hallucinations. Does not appear to be responding to internal stimuli.   TTS assessment is not needed for patient at this time.     Physical Exam: Physical Exam Constitutional:      General: He is not in acute distress.    Appearance: He is not ill-appearing, toxic-appearing or diaphoretic.  HENT:     Head: Normocephalic.     Right Ear: External ear normal.     Left Ear: External ear normal.  Eyes:     Pupils: Pupils are equal, round, and reactive to light.  Cardiovascular:     Rate and Rhythm: Normal rate.  Pulmonary:     Effort: Pulmonary effort is normal. No respiratory distress.  Musculoskeletal:        General: Normal range of motion.  Skin:    General: Skin is warm and dry.  Neurological:     Mental Status: He is alert and oriented to person, place, and time.  Psychiatric:        Attention and Perception: Attention normal.        Mood and Affect: Mood normal. Mood is not anxious or depressed.        Speech: Speech normal.        Behavior: Behavior normal.        Thought Content: Thought content is not paranoid or delusional. Thought content does not include homicidal or suicidal ideation. Thought content does not include suicidal plan.    Review of Systems  Constitutional: Negative for chills, diaphoresis, fever, malaise/fatigue and weight loss.  Respiratory: Negative for cough and shortness of breath.   Cardiovascular: Negative for chest pain.  Gastrointestinal: Negative for diarrhea, nausea and vomiting.  Psychiatric/Behavioral: Negative for depression, hallucinations and suicidal ideas. The patient is not nervous/anxious.      Recommendations:  Based on my  evaluation the patient does not appear to have an emergency medical condition.   Patient allowed to use shower.  Rozetta Nunnery, NP 04/11/2020, 1:32 AM

## 2020-04-11 NOTE — ED Notes (Signed)
Pt in shower.  

## 2020-04-11 NOTE — ED Notes (Signed)
Pt blood pressure is high.

## 2020-04-11 NOTE — ED Notes (Signed)
Jason informed pt. Blood pressure is high.

## 2020-04-11 NOTE — ED Notes (Signed)
Pt received his belongings and walked back outside.

## 2020-04-11 NOTE — ED Notes (Signed)
Pt. Belongings stored in locker number 26.

## 2020-04-12 ENCOUNTER — Emergency Department (HOSPITAL_COMMUNITY)
Admission: EM | Admit: 2020-04-12 | Discharge: 2020-04-13 | Disposition: A | Discharge status: Home / Self Care | Payer: Medicare Other | Attending: Emergency Medicine | Admitting: Emergency Medicine

## 2020-04-12 ENCOUNTER — Other Ambulatory Visit: Payer: Self-pay

## 2020-04-12 ENCOUNTER — Encounter (HOSPITAL_COMMUNITY): Payer: Self-pay | Admitting: Emergency Medicine

## 2020-04-12 ENCOUNTER — Emergency Department (HOSPITAL_COMMUNITY)
Admission: EM | Admit: 2020-04-12 | Discharge: 2020-04-12 | Disposition: A | Discharge status: Home / Self Care | Payer: Medicare Other | Attending: Emergency Medicine | Admitting: Emergency Medicine

## 2020-04-12 DIAGNOSIS — Z79899 Other long term (current) drug therapy: Secondary | ICD-10-CM | POA: Insufficient documentation

## 2020-04-12 DIAGNOSIS — I10 Essential (primary) hypertension: Secondary | ICD-10-CM | POA: Insufficient documentation

## 2020-04-12 DIAGNOSIS — Z7982 Long term (current) use of aspirin: Secondary | ICD-10-CM | POA: Diagnosis not present

## 2020-04-12 DIAGNOSIS — M25562 Pain in left knee: Secondary | ICD-10-CM | POA: Diagnosis present

## 2020-04-12 DIAGNOSIS — G8929 Other chronic pain: Secondary | ICD-10-CM | POA: Diagnosis not present

## 2020-04-12 DIAGNOSIS — F17210 Nicotine dependence, cigarettes, uncomplicated: Secondary | ICD-10-CM | POA: Insufficient documentation

## 2020-04-12 NOTE — ED Triage Notes (Signed)
Pt c/o L knee pain, chronic, no new injury

## 2020-04-12 NOTE — ED Provider Notes (Signed)
[facility] EMERGENCY DEPARTMENT Provider Note  CSN: [id number] Arrival date & time: 04/12/20 5809  Chief Complaint(s) Knee Pain  HPI [name] is a 69 y.o. male here for left knee pain.  History of chronic knee pain.  He denies any trauma, swelling, numbness or tingling.  HPI  Past Medical History Past Medical History:  Diagnosis Date  . BPH (benign prostatic hyperplasia)   . Colon polyp 2010  . Depression   . GERD (gastroesophageal reflux disease)   . Hepatitis C    "caught it when I had a blood transfusion"  . High cholesterol   . History of blood transfusion    "when I was young"  . Hypertension   . Paranoid schizophrenia ([place])   . Prostate atrophy   . Retinal vein occlusion    Patient Active Problem List   Diagnosis Date Noted  . GERD (gastroesophageal reflux disease) 05/03/2019  . Hypotension 08/02/2018  . Low back sprain, initial encounter 10/21/2017  . History of drug abuse ([place]) 10/18/2017  . Gingival erythema 09/23/2017  . Chronic pain of left knee 11/24/2016  . Tobacco abuse   . Diastolic heart failure ([place]) 02/05/2015  . Healthcare maintenance 05/28/2014  . Hyperlipidemia 03/10/2014  . Patellar tendonitis 11/16/2012  . [place] OCCLUSION 06/03/2010  . PARANOID SCHIZOPHRENIA, CHRONIC 01/17/2009  . HYPERTENSION, BENIGN ESSENTIAL 01/17/2009  . BPH (benign prostatic hyperplasia) 01/17/2009  . HEPATITIS C 12/14/2008  . DEPRESSION 12/14/2008   Home Medication(s) Prior to Admission medications   Medication Sig Start Date End Date Taking? Authorizing Provider  amLODipine (NORVASC) 10 MG tablet Take 1 tablet (10 mg total) by mouth daily. 04/13/18   [name], DO  aspirin 81 MG tablet Take 1 tablet (81 mg total) by mouth daily. 09/24/14   [name], MD  atorvastatin (LIPITOR) 40 MG tablet Take 1 tablet (40 mg total) by mouth daily. 08/15/19   [name], DO  benztropine (COGENTIN) 1 MG tablet Take 1 tablet  (1 mg total) by mouth 2 (two) times daily. 02/26/20   Salley Slaughter, NP  cetirizine (ZYRTEC ALLERGY) 10 MG tablet Take 1 tablet (10 mg total) by mouth daily. 02/01/20   [name], [name], PA-C  famotidine (PEPCID) 20 MG tablet Take 1 tablet (20 mg total) by mouth 2 (two) times daily. 05/02/19   [name], DO  fluPHENAZine decanoate (PROLIXIN) 25 MG/ML injection Inject 1 mL (25 mg total) into the muscle every 14 (fourteen) days. Last dose 09/07/12 02/26/20   Salley Slaughter, NP  fluticasone (FLONASE) 50 MCG/ACT nasal spray Place 2 sprays into both nostrils daily. 03/17/20   Couture, Cortni S, PA-C  hydrOXYzine (VISTARIL) 50 MG capsule Take 50 mg by mouth at bedtime. 02/01/20   [provider]  losartan (COZAAR) 100 MG tablet Take 1 tablet (100 mg total) by mouth daily. 11/28/19   Tammi Klippel, Sherin, DO  mometasone (NASONEX) 50 MCG/ACT nasal spray Place 2 sprays into the nose daily. 12/29/19   [name], PA-C  Multiple Vitamin (MULTIVITAMIN WITH MINERALS) TABS tablet Take 1 tablet by mouth daily. 06/28/14   [name], MD  sodium chloride (OCEAN) 0.65 % SOLN nasal spray Place 1 spray into both nostrils as needed for congestion. 11/19/19   [name], PA-C  tamsulosin (FLOMAX) 0.4 MG CAPS capsule TAKE 2 CAPSULE BY MOUTH DAILY Patient taking differently: Take 0.8 mg by mouth daily.  02/23/20   Mullis, Kiersten P, DO  traMADol (ULTRAM) 50  MG tablet Take 1 tablet (50 mg total) by mouth every 6 (six) hours as needed. 07/04/18   [name], MD  triamcinolone cream (KENALOG) 0.1 % Apply 1 application topically 2 (two) times daily. For 5 days, then twice daily as needed for rash and itching. 01/03/15   [name], MD  vitamin C (ASCORBIC ACID) 500 MG tablet Take 500 mg by mouth daily.    [provider]  loratadine (CLARITIN) 10 MG tablet Take 1 tablet (10 mg total) by mouth daily. 12/28/19 02/05/20  [name], DO                                                                                                                                     Past Surgical History Past Surgical History:  Procedure Laterality Date  . CIRCUMCISION  1959  . TONSILLECTOMY     Family History Family History  Problem Relation Age of Onset  . Hypertension Mother   . Diabetes Mother   . Stroke Mother     Social History Social History   Tobacco Use  . Smoking status: Current Every Day Smoker    Packs/day: 0.50    Years: 48.00    Pack years: 24.00    Types: Cigarettes  . Smokeless tobacco: Never Used  Substance Use Topics  . Alcohol use: Yes  . Drug use: No   Allergies Lisinopril, Penicillins, Amoxicillin, and Ibuprofen  Review of Systems Review of Systems All other systems are reviewed and are negative for acute change except as noted in the HPI  Physical Exam Vital Signs  I have reviewed the triage vital signs BP (!) 176/94 (BP Location: Left Arm)   Pulse 61   Temp 98.4 F (36.9 C) (Oral)   Resp 15   SpO2 100%   Physical Exam Vitals reviewed.  Constitutional:      General: He is not in acute distress.    Appearance: He is well-developed. He is not diaphoretic.  HENT:     Head: Normocephalic and atraumatic.     Jaw: No trismus.     Right Ear: External ear normal.     Left Ear: External ear normal.     Nose: Nose normal.  Eyes:     General: No scleral icterus.    Conjunctiva/sclera: Conjunctivae normal.  Neck:     Trachea: Phonation normal.  Cardiovascular:     Rate and Rhythm: Normal rate and regular rhythm.  Pulmonary:     Effort: Pulmonary effort is normal. No respiratory distress.     Breath sounds: No stridor.  Abdominal:     General: There is no distension.  Musculoskeletal:        General: Normal range of motion.     Cervical back: Normal range of motion.     Right knee: No swelling or deformity. Normal range of motion. No tenderness.     Left knee: No swelling  or deformity. Normal range of motion. No tenderness.  Neurological:      Mental Status: He is alert and oriented to person, place, and time.  Psychiatric:        Behavior: Behavior normal.     ED Results and Treatments Labs (all labs ordered are listed, but only abnormal results are displayed) Labs Reviewed - No data to display                                                                                                                       EKG  EKG Interpretation  Date/Time:    Ventricular Rate:    PR Interval:    QRS Duration:   QT Interval:    QTC Calculation:   R Axis:     Text Interpretation:        Radiology No results found.  Pertinent labs & imaging results that were available during my care of the patient were reviewed by me and considered in my medical decision making (see chart for details).  Medications Ordered in ED Medications - No data to display                                                                                                                                  Procedures Procedures  (including critical care time)  Medical Decision Making / ED Course I have reviewed the nursing notes for this encounter and the patient's prior records (if available in EHR or on provided paperwork).   [name] was evaluated in Emergency Department on 04/12/2020 for the symptoms described in the history of present illness. He was evaluated in the context of the global COVID-19 pandemic, which necessitated consideration that the patient might be at risk for infection with the SARS-CoV-2 virus that causes COVID-19. Institutional protocols and algorithms that pertain to the evaluation of patients at risk for COVID-19 are in a state of rapid change based on information released by regulatory bodies including the CDC and federal and state organizations. These policies and algorithms were followed during the patient's care in the ED.  Patient here for chronic knee pain.  No acute injuries.  No evidence of trauma on exam.  No need for  imaging.  Here frequently for same complaint and requesting to be allowed to sleep.  Highly suspicious for secondary gain.      Final Clinical Impression(s) / ED Diagnoses Final diagnoses:  Chronic pain of left knee   The patient appears reasonably screened and/or stabilized for discharge and I doubt any other medical condition or other [facility] requiring further screening, evaluation, or treatment in the ED at this time prior to discharge. Safe for discharge with strict return precautions.  Disposition: Discharge  Condition: Good  I have discussed the results, Dx and Tx plan with the patient/family who expressed understanding and agree(s) with the plan. Discharge instructions discussed at length. The patient/family was given strict return precautions who verbalized understanding of the instructions. No further questions at time of discharge.    ED Discharge Orders    None       Follow Up: Primary care provider  Schedule an appointment as soon as possible for a visit  As needed      This chart was dictated using voice recognition software.  Despite best efforts to proofread,  errors can occur which can change the documentation meaning.   Fatima Blank, MD 04/12/20 [phone]

## 2020-04-12 NOTE — ED Triage Notes (Signed)
Pt presents to ED BIB POV presenting with Knee pain. States the pain started last night.

## 2020-04-12 NOTE — ED Notes (Signed)
Refused vitals 

## 2020-04-13 ENCOUNTER — Emergency Department (HOSPITAL_COMMUNITY)
Admission: EM | Admit: 2020-04-13 | Discharge: 2020-04-14 | Disposition: A | Discharge status: Home / Self Care | Payer: Medicare Other | Source: Home / Self Care

## 2020-04-13 DIAGNOSIS — M25562 Pain in left knee: Secondary | ICD-10-CM | POA: Insufficient documentation

## 2020-04-13 DIAGNOSIS — Z5321 Procedure and treatment not carried out due to patient leaving prior to being seen by health care provider: Secondary | ICD-10-CM | POA: Insufficient documentation

## 2020-04-13 MED ORDER — ACETAMINOPHEN 325 MG PO TABS: 650.0000 mg | ORAL_TABLET | Freq: Four times a day (QID) | ORAL | 0 refills | Status: AC | PRN

## 2020-04-13 NOTE — ED Provider Notes (Signed)
[facility] EMERGENCY DEPARTMENT Provider Note   CSN: [id number] Arrival date & time: 04/12/20  2225     History Chief Complaint  Patient presents with  . Knee Pain    [name] is a 69 y.o. male.  HPI   69 year old male with a history of BPH, colon polyp, depression, GERD, hep C, hyperlipidemia, hypertension, paranoid schizophrenia, retinal vein occlusion, who presents the emergency department today complaining of left knee pain. Pain located to the left anterior knee.  Has history of chronic knee pain.  He denies any falls, trauma.  He denies any fevers or swelling.  Denies any other associated complaints.  States he has been taking Tylenol.  Past Medical History:  Diagnosis Date  . BPH (benign prostatic hyperplasia)   . Colon polyp 2010  . Depression   . GERD (gastroesophageal reflux disease)   . Hepatitis C    "caught it when I had a blood transfusion"  . High cholesterol   . History of blood transfusion    "when I was young"  . Hypertension   . Paranoid schizophrenia ([place])   . Prostate atrophy   . Retinal vein occlusion     Patient Active Problem List   Diagnosis Date Noted  . GERD (gastroesophageal reflux disease) 05/03/2019  . Hypotension 08/02/2018  . Low back sprain, initial encounter 10/21/2017  . History of drug abuse ([place]) 10/18/2017  . Gingival erythema 09/23/2017  . Chronic pain of left knee 11/24/2016  . Tobacco abuse   . Diastolic heart failure ([place]) 02/05/2015  . Healthcare maintenance 05/28/2014  . Hyperlipidemia 03/10/2014  . Patellar tendonitis 11/16/2012  . [place] OCCLUSION 06/03/2010  . PARANOID SCHIZOPHRENIA, CHRONIC 01/17/2009  . HYPERTENSION, BENIGN ESSENTIAL 01/17/2009  . BPH (benign prostatic hyperplasia) 01/17/2009  . HEPATITIS C 12/14/2008  . DEPRESSION 12/14/2008    Past Surgical History:  Procedure Laterality Date  . CIRCUMCISION  1959  . TONSILLECTOMY         Family History  Problem  Relation Age of Onset  . Hypertension Mother   . Diabetes Mother   . Stroke Mother     Social History   Tobacco Use  . Smoking status: Current Every Day Smoker    Packs/day: 0.50    Years: 48.00    Pack years: 24.00    Types: Cigarettes  . Smokeless tobacco: Never Used  Substance Use Topics  . Alcohol use: Yes  . Drug use: No    Home Medications Prior to Admission medications   Medication Sig Start Date End Date Taking? Authorizing Provider  acetaminophen (TYLENOL) 325 MG tablet Take 2 tablets (650 mg total) by mouth every 6 (six) hours as needed for up to 5 days. Do not take more than 4000mg  of tylenol per day 04/13/20 04/18/20  Louis  S, PA-C  amLODipine (NORVASC) 10 MG tablet Take 1 tablet (10 mg total) by mouth daily. 04/13/18   [name], DO  aspirin 81 MG tablet Take 1 tablet (81 mg total) by mouth daily. 09/24/14   [name], MD  atorvastatin (LIPITOR) 40 MG tablet Take 1 tablet (40 mg total) by mouth daily. 08/15/19   [name], DO  benztropine (COGENTIN) 1 MG tablet Take 1 tablet (1 mg total) by mouth 2 (two) times daily. 02/26/20   Salley Slaughter, NP  cetirizine (ZYRTEC ALLERGY) 10 MG tablet Take 1 tablet (10 mg total) by mouth daily. 02/01/20   Nils Flack, [name], PA-C  famotidine (  PEPCID) 20 MG tablet Take 1 tablet (20 mg total) by mouth 2 (two) times daily. 05/02/19   [name], DO  fluPHENAZine decanoate (PROLIXIN) 25 MG/ML injection Inject 1 mL (25 mg total) into the muscle every 14 (fourteen) days. Last dose 09/07/12 02/26/20   Salley Slaughter, NP  fluticasone (FLONASE) 50 MCG/ACT nasal spray Place 2 sprays into both nostrils daily. 03/17/20   Dorrance Sellick S, PA-C  hydrOXYzine (VISTARIL) 50 MG capsule Take 50 mg by mouth at bedtime. 02/01/20   [provider]  losartan (COZAAR) 100 MG tablet Take 1 tablet (100 mg total) by mouth daily. 11/28/19   Tammi Klippel, Sherin, DO  mometasone (NASONEX) 50 MCG/ACT nasal spray Place 2 sprays  into the nose daily. 12/29/19   [name], PA-C  Multiple Vitamin (MULTIVITAMIN WITH MINERALS) TABS tablet Take 1 tablet by mouth daily. 06/28/14   [name], MD  sodium chloride (OCEAN) 0.65 % SOLN nasal spray Place 1 spray into both nostrils as needed for congestion. 11/19/19   [name], PA-C  tamsulosin (FLOMAX) 0.4 MG CAPS capsule TAKE 2 CAPSULE BY MOUTH DAILY Patient taking differently: Take 0.8 mg by mouth daily.  02/23/20   Mullis, Kiersten P, DO  traMADol (ULTRAM) 50 MG tablet Take 1 tablet (50 mg total) by mouth every 6 (six) hours as needed. 07/04/18   [name], MD  triamcinolone cream (KENALOG) 0.1 % Apply 1 application topically 2 (two) times daily. For 5 days, then twice daily as needed for rash and itching. 01/03/15   [name], MD  vitamin C (ASCORBIC ACID) 500 MG tablet Take 500 mg by mouth daily.    [provider]  loratadine (CLARITIN) 10 MG tablet Take 1 tablet (10 mg total) by mouth daily. 12/28/19 02/05/20  [name], DO    Allergies    Lisinopril, Penicillins, Amoxicillin, and Ibuprofen  Review of Systems   Review of Systems  Constitutional: Negative for fever.  Respiratory: Negative for cough and shortness of breath.   Cardiovascular: Negative for chest pain and leg swelling.  Musculoskeletal: Negative for joint swelling.       Left knee pain    Physical Exam Updated Vital Signs BP (!) 148/106 (BP Location: Left Arm)   Pulse (!) 56   Temp 99 F (37.2 C)   Resp 16   SpO2 100%   Physical Exam Constitutional:      General: He is not in acute distress.    Appearance: He is well-developed.  Eyes:     Conjunctiva/sclera: Conjunctivae normal.  Cardiovascular:     Rate and Rhythm: Normal rate.  Pulmonary:     Effort: Pulmonary effort is normal.  Musculoskeletal:     Comments: Ace wrap noted to the left knee. This was removed and there was no evidence of trauma, swelling, erythema or warmth. Reports TTP to the left  anterior knee however no obvious discomfort with palpation of the knee. Flexion/extension of the knee intact. No calf TTP. Ambulatory with steady gait without limp.  Skin:    General: Skin is warm and dry.  Neurological:     Mental Status: He is alert.     ED Results / Procedures / Treatments   Labs (all labs ordered are listed, but only abnormal results are displayed) Labs Reviewed - No data to display  EKG None  Radiology No results found.  Procedures Procedures (including critical care time)  Medications Ordered in ED Medications - No data to display  ED Course  I have reviewed the triage vital signs and the nursing notes.  Pertinent labs & imaging results that were available during my care of the patient were reviewed by me and considered in my medical decision making (see chart for details).    MDM Rules/Calculators/A&P                          69 year old male presenting for evaluation of left knee pain which is chronic in nature.  Seen in the ED frequently for similar symptoms.  Denies any new falls or trauma.  No fevers, swelling, redness or warmth to suggest septic arthritis.  Reviewed imaging from 11/22/2019 which did not show any evidence of fracture, dislocation or joint effusion.  No evidence of arthropathy or other focal bony abnormality in the soft tissues were unremarkable.  He has had no new trauma that would warrant repeat imaging.  He is ambulatory in no distress today.  Will give Rx for Tylenol and have him follow-up with PCP.  Return precautions given.   Final Clinical Impression(s) / ED Diagnoses Final diagnoses:  Chronic pain of left knee    Rx / DC Orders ED Discharge Orders         Ordered    acetaminophen (TYLENOL) 325 MG tablet  Every 6 hours PRN        04/13/20 0843           Rodney Booze, PA-C 04/13/20 0847    Pattricia Boss, MD 04/15/20 [phone]

## 2020-04-13 NOTE — ED Triage Notes (Signed)
Pt reports continued left knee pain.  No other changes since yesterday.

## 2020-04-13 NOTE — Discharge Instructions (Addendum)
You may alternate taking Tylenol as needed for pain control. You may take [phone] mg of Tylenol every 6 hours. Do not exceed 4000 mg of Tylenol daily as this can lead to liver damage. You may use warm and cold compresses to help with your symptoms.

## 2020-04-14 NOTE — ED Notes (Signed)
Pt could not be found when called for vitals recheck twice.

## 2020-04-15 ENCOUNTER — Emergency Department (HOSPITAL_COMMUNITY)
Admission: EM | Admit: 2020-04-15 | Discharge: 2020-04-15 | Disposition: A | Discharge status: Home / Self Care | Payer: Medicare Other | Source: Home / Self Care | Attending: Emergency Medicine | Admitting: Emergency Medicine

## 2020-04-15 ENCOUNTER — Emergency Department (HOSPITAL_COMMUNITY)
Admission: EM | Admit: 2020-04-15 | Discharge: 2020-04-15 | Disposition: A | Discharge status: Home / Self Care | Payer: Medicare Other | Attending: Emergency Medicine | Admitting: Emergency Medicine

## 2020-04-15 DIAGNOSIS — G8929 Other chronic pain: Secondary | ICD-10-CM | POA: Insufficient documentation

## 2020-04-15 DIAGNOSIS — F200 Paranoid schizophrenia: Secondary | ICD-10-CM | POA: Insufficient documentation

## 2020-04-15 DIAGNOSIS — Z79899 Other long term (current) drug therapy: Secondary | ICD-10-CM | POA: Insufficient documentation

## 2020-04-15 DIAGNOSIS — M25562 Pain in left knee: Secondary | ICD-10-CM | POA: Diagnosis not present

## 2020-04-15 DIAGNOSIS — Z7982 Long term (current) use of aspirin: Secondary | ICD-10-CM | POA: Insufficient documentation

## 2020-04-15 DIAGNOSIS — I10 Essential (primary) hypertension: Secondary | ICD-10-CM | POA: Insufficient documentation

## 2020-04-15 DIAGNOSIS — R0981 Nasal congestion: Secondary | ICD-10-CM | POA: Insufficient documentation

## 2020-04-15 DIAGNOSIS — F99 Mental disorder, not otherwise specified: Secondary | ICD-10-CM | POA: Insufficient documentation

## 2020-04-15 DIAGNOSIS — F17210 Nicotine dependence, cigarettes, uncomplicated: Secondary | ICD-10-CM | POA: Insufficient documentation

## 2020-04-15 MED ORDER — ACETAMINOPHEN 325 MG PO TABS
650.0000 mg | ORAL_TABLET | Freq: Once | ORAL | Status: AC
  Administered 2020-04-15: 650 mg via ORAL
  Filled 2020-04-15: qty 2

## 2020-04-15 MED ORDER — LIDOCAINE 5 % EX PTCH: 1.0000 | MEDICATED_PATCH | CUTANEOUS | 0 refills | Status: DC

## 2020-04-15 MED ORDER — LIDOCAINE 5 % EX PTCH
1.0000 | MEDICATED_PATCH | CUTANEOUS | Status: DC
  Administered 2020-04-15: 1 via TRANSDERMAL
  Filled 2020-04-15: qty 1

## 2020-04-15 NOTE — ED Triage Notes (Signed)
Nasal congestion.  

## 2020-04-15 NOTE — ED Triage Notes (Signed)
Pt here tonight for chronic right knee pain pt has been seen at this ER for this issue many times. Pt is walking around lobby with no assistive devices with a steady gait. Pt appears to be in no acute distress.

## 2020-04-15 NOTE — Discharge Instructions (Signed)
You may use the lidocaine patch, change this once a day, put it directly on the area that hurts on your knee, see your doctor for follow-up, if you do not have a doctor see the list above

## 2020-04-15 NOTE — ED Notes (Signed)
Pt is not in lobby. Unable to locate pt at this time. Not able to triage or vitals

## 2020-04-15 NOTE — ED Provider Notes (Signed)
[facility] EMERGENCY DEPARTMENT Provider Note   CSN: [id number] Arrival date & time: 04/15/20  2217     History Chief Complaint  Patient presents with  . Knee Pain    [name] is a 69 y.o. male.  HPI   This patient has chronic knee pain, has been seen here almost daily for quite some time, he has no other complaints, he does have an issue with paranoid schizophrenia, he has not been a danger to himself or others at all, level 5 caveat applies secondary to underlying psychiatric condition  Past Medical History:  Diagnosis Date  . BPH (benign prostatic hyperplasia)   . Colon polyp 2010  . Depression   . GERD (gastroesophageal reflux disease)   . Hepatitis C    "caught it when I had a blood transfusion"  . High cholesterol   . History of blood transfusion    "when I was young"  . Hypertension   . Paranoid schizophrenia ([place])   . Prostate atrophy   . Retinal vein occlusion     Patient Active Problem List   Diagnosis Date Noted  . GERD (gastroesophageal reflux disease) 05/03/2019  . Hypotension 08/02/2018  . Low back sprain, initial encounter 10/21/2017  . History of drug abuse ([place]) 10/18/2017  . Gingival erythema 09/23/2017  . Chronic pain of left knee 11/24/2016  . Tobacco abuse   . Diastolic heart failure ([place]) 02/05/2015  . Healthcare maintenance 05/28/2014  . Hyperlipidemia 03/10/2014  . Patellar tendonitis 11/16/2012  . [place] OCCLUSION 06/03/2010  . PARANOID SCHIZOPHRENIA, CHRONIC 01/17/2009  . HYPERTENSION, BENIGN ESSENTIAL 01/17/2009  . BPH (benign prostatic hyperplasia) 01/17/2009  . HEPATITIS C 12/14/2008  . DEPRESSION 12/14/2008    Past Surgical History:  Procedure Laterality Date  . CIRCUMCISION  1959  . TONSILLECTOMY         Family History  Problem Relation Age of Onset  . Hypertension Mother   . Diabetes Mother   . Stroke Mother     Social History   Tobacco Use  . Smoking status: Current Every  Day Smoker    Packs/day: 0.50    Years: 48.00    Pack years: 24.00    Types: Cigarettes  . Smokeless tobacco: Never Used  Substance Use Topics  . Alcohol use: Yes  . Drug use: No    Home Medications Prior to Admission medications   Medication Sig Start Date End Date Taking? Authorizing Provider  acetaminophen (TYLENOL) 325 MG tablet Take 2 tablets (650 mg total) by mouth every 6 (six) hours as needed for up to 5 days. Do not take more than 4000mg  of tylenol per day 04/13/20 04/18/20  Couture, Cortni S, PA-C  amLODipine (NORVASC) 10 MG tablet Take 1 tablet (10 mg total) by mouth daily. 04/13/18   [name], DO  aspirin 81 MG tablet Take 1 tablet (81 mg total) by mouth daily. 09/24/14   [name], MD  atorvastatin (LIPITOR) 40 MG tablet Take 1 tablet (40 mg total) by mouth daily. 08/15/19   [name], DO  benztropine (COGENTIN) 1 MG tablet Take 1 tablet (1 mg total) by mouth 2 (two) times daily. 02/26/20   Salley Slaughter, NP  cetirizine (ZYRTEC ALLERGY) 10 MG tablet Take 1 tablet (10 mg total) by mouth daily. 02/01/20   [name], [name], PA-C  famotidine (PEPCID) 20 MG tablet Take 1 tablet (20 mg total) by mouth 2 (two) times daily. 05/02/19   [name],  DO  fluPHENAZine decanoate (PROLIXIN) 25 MG/ML injection Inject 1 mL (25 mg total) into the muscle every 14 (fourteen) days. Last dose 09/07/12 02/26/20   Salley Slaughter, NP  fluticasone (FLONASE) 50 MCG/ACT nasal spray Place 2 sprays into both nostrils daily. 03/17/20   Couture, Cortni S, PA-C  hydrOXYzine (VISTARIL) 50 MG capsule Take 50 mg by mouth at bedtime. 02/01/20   [provider]  lidocaine (LIDODERM) 5 % Place 1 patch onto the skin daily. Remove & Discard patch within 12 hours or as directed by MD 04/15/20   Noemi Chapel, MD  losartan (COZAAR) 100 MG tablet Take 1 tablet (100 mg total) by mouth daily. 11/28/19   Tammi Klippel, Sherin, DO  mometasone (NASONEX) 50 MCG/ACT nasal spray Place 2 sprays into the  nose daily. 12/29/19   [name], PA-C  Multiple Vitamin (MULTIVITAMIN WITH MINERALS) TABS tablet Take 1 tablet by mouth daily. 06/28/14   [name], MD  sodium chloride (OCEAN) 0.65 % SOLN nasal spray Place 1 spray into both nostrils as needed for congestion. 11/19/19   [name], PA-C  tamsulosin (FLOMAX) 0.4 MG CAPS capsule TAKE 2 CAPSULE BY MOUTH DAILY Patient taking differently: Take 0.8 mg by mouth daily.  02/23/20   Mullis, Kiersten P, DO  traMADol (ULTRAM) 50 MG tablet Take 1 tablet (50 mg total) by mouth every 6 (six) hours as needed. 07/04/18   [name], MD  triamcinolone cream (KENALOG) 0.1 % Apply 1 application topically 2 (two) times daily. For 5 days, then twice daily as needed for rash and itching. 01/03/15   [name], MD  vitamin C (ASCORBIC ACID) 500 MG tablet Take 500 mg by mouth daily.    [provider]  loratadine (CLARITIN) 10 MG tablet Take 1 tablet (10 mg total) by mouth daily. 12/28/19 02/05/20  [name], DO    Allergies    Lisinopril, Penicillins, Amoxicillin, and Ibuprofen  Review of Systems   Review of Systems  Unable to perform ROS: Psychiatric disorder    Physical Exam Updated Vital Signs BP (!) 181/98 (BP Location: Left Arm)   Pulse 66   Temp 97.8 F (36.6 C) (Oral)   Resp 18   SpO2 100%   Physical Exam Vitals and nursing note reviewed.  Constitutional:      Appearance: He is well-developed. He is not diaphoretic.  HENT:     Head: Normocephalic and atraumatic.  Eyes:     General:        Right eye: No discharge.        Left eye: No discharge.     Conjunctiva/sclera: Conjunctivae normal.  Pulmonary:     Effort: Pulmonary effort is normal. No respiratory distress.  Musculoskeletal:     Comments: Totally normal range of motion of all joints of the upper and lower extremities, he is able to stand, stand on each leg independently, move both legs at the hips knees and ankles without difficulty, there is no  warmth or swelling of the knee, there is no crepitance with range of motion, no overlying redness or warmth  Skin:    General: Skin is warm and dry.     Findings: No erythema or rash.  Neurological:     Mental Status: He is alert.     Coordination: Coordination normal.     ED Results / Procedures / Treatments   Labs (all labs ordered are listed, but only abnormal results are displayed) Labs Reviewed - No data to  display  EKG None  Radiology No results found.  Procedures Procedures (including critical care time)  Medications Ordered in ED Medications  acetaminophen (TYLENOL) tablet 650 mg (has no administration in time range)  lidocaine (LIDODERM) 5 % 1 patch (has no administration in time range)    ED Course  I have reviewed the triage vital signs and the nursing notes.  Pertinent labs & imaging results that were available during my care of the patient were reviewed by me and considered in my medical decision making (see chart for details).    MDM Rules/Calculators/A&P                          Chronic pain, topical lidocaine, Tylenol, stable for discharge, doubt septic joint or other significant condition.  He has been complaining of knee pain for years based on the medical record, he has had his knee imaged many times most recently in April 2021 when it was normal as it was multiple times in the past as well, I do not see the need for repeat imaging tonight, stable for discharge  Final Clinical Impression(s) / ED Diagnoses Final diagnoses:  Chronic pain of left knee    Rx / DC Orders ED Discharge Orders         Ordered    lidocaine (LIDODERM) 5 %  Every 24 hours        04/15/20 2249           Noemi Chapel, MD 04/15/20 2254

## 2020-04-15 NOTE — ED Notes (Signed)
Verbalized understanding of DC instructions, Rx, follow up care 

## 2020-04-16 ENCOUNTER — Other Ambulatory Visit: Payer: Self-pay

## 2020-04-16 ENCOUNTER — Emergency Department (HOSPITAL_COMMUNITY)
Admission: EM | Admit: 2020-04-16 | Discharge: 2020-04-16 | Disposition: A | Discharge status: Home / Self Care | Payer: Medicare Other | Attending: Emergency Medicine | Admitting: Emergency Medicine

## 2020-04-16 DIAGNOSIS — G894 Chronic pain syndrome: Secondary | ICD-10-CM | POA: Diagnosis not present

## 2020-04-16 DIAGNOSIS — Z5321 Procedure and treatment not carried out due to patient leaving prior to being seen by health care provider: Secondary | ICD-10-CM | POA: Insufficient documentation

## 2020-04-16 DIAGNOSIS — I10 Essential (primary) hypertension: Secondary | ICD-10-CM | POA: Diagnosis present

## 2020-04-16 NOTE — ED Notes (Signed)
Called pt x3 for vitals, no response. 

## 2020-04-16 NOTE — ED Notes (Signed)
N.A.X1

## 2020-04-16 NOTE — ED Triage Notes (Signed)
Patient states that his BP is high and not taking meds, also complains of chronic pain.

## 2020-04-17 ENCOUNTER — Encounter (HOSPITAL_COMMUNITY): Payer: Self-pay | Admitting: Emergency Medicine

## 2020-04-17 ENCOUNTER — Emergency Department (HOSPITAL_COMMUNITY)
Admission: EM | Admit: 2020-04-17 | Discharge: 2020-04-17 | Disposition: A | Discharge status: Home / Self Care | Payer: Medicare Other | Attending: Emergency Medicine | Admitting: Emergency Medicine

## 2020-04-17 DIAGNOSIS — Z5321 Procedure and treatment not carried out due to patient leaving prior to being seen by health care provider: Secondary | ICD-10-CM | POA: Diagnosis not present

## 2020-04-17 DIAGNOSIS — R030 Elevated blood-pressure reading, without diagnosis of hypertension: Secondary | ICD-10-CM | POA: Diagnosis not present

## 2020-04-17 DIAGNOSIS — R4182 Altered mental status, unspecified: Secondary | ICD-10-CM | POA: Diagnosis not present

## 2020-04-17 LAB — ETHANOL: Alcohol, Ethyl (B): <10 mg/dL (ref <10)

## 2020-04-17 LAB — URINALYSIS, ROUTINE W REFLEX MICROSCOPIC
Bacteria, UA: NONE SEEN
Bilirubin Urine: NEGATIVE
Glucose, UA: NEGATIVE mg/dL
Hgb urine dipstick: NEGATIVE
Ketones, ur: NEGATIVE mg/dL
Leukocytes,Ua: NEGATIVE
Nitrite: NEGATIVE
Protein, ur: 30 mg/dL — AB
Specific Gravity, Urine: 1.017 (ref 1.005–1.030)
pH: 5 (ref 5.0–8.0)

## 2020-04-17 LAB — COMPREHENSIVE METABOLIC PANEL
ALT: 13 U/L (ref 0–44)
AST: 22 U/L (ref 15–41)
Albumin: 4.5 g/dL (ref 3.5–5.0)
Alkaline Phosphatase: 35 U/L — ABNORMAL LOW (ref 38–126)
Anion gap: 11 (ref 5–15)
BUN: 19 mg/dL (ref 8–23)
CO2: 28 mmol/L (ref 22–32)
Calcium: 9.3 mg/dL (ref 8.9–10.3)
Chloride: 100 mmol/L (ref 98–111)
Creatinine, Ser: 1.53 mg/dL — ABNORMAL HIGH (ref 0.61–1.24)
GFR calc Af Amer: 53 mL/min — ABNORMAL LOW (ref >60)
GFR calc non Af Amer: 46 mL/min — ABNORMAL LOW (ref >60)
Glucose, Bld: 96 mg/dL (ref 70–99)
Potassium: 3.4 mmol/L — ABNORMAL LOW (ref 3.5–5.1)
Sodium: 139 mmol/L (ref 135–145)
Total Bilirubin: 0.7 mg/dL (ref 0.3–1.2)
Total Protein: 7.4 g/dL (ref 6.5–8.1)

## 2020-04-17 LAB — CBG MONITORING, ED: Glucose-Capillary: 104 mg/dL — ABNORMAL HIGH (ref 70–99)

## 2020-04-17 LAB — CBC
HCT: 40.5 % (ref 39.0–52.0)
Hemoglobin: 13.5 g/dL (ref 13.0–17.0)
MCH: 30.6 pg (ref 26.0–34.0)
MCHC: 33.3 g/dL (ref 30.0–36.0)
MCV: 91.8 fL (ref 80.0–100.0)
Platelets: 120 10*3/uL — ABNORMAL LOW (ref 150–400)
RBC: 4.41 MIL/uL (ref 4.22–5.81)
RDW: 13.3 % (ref 11.5–15.5)
WBC: 5.2 10*3/uL (ref 4.0–10.5)
nRBC: 0 % (ref 0.0–0.2)

## 2020-04-17 NOTE — ED Triage Notes (Signed)
Pt was found sleeping in the waiting room, he appears altered.  He is not answering questions or responding to staff's voices.  He is trembling and stating his blood pressure is high.

## 2020-04-18 ENCOUNTER — Emergency Department (HOSPITAL_COMMUNITY)
Admission: EM | Admit: 2020-04-18 | Discharge: 2020-04-19 | Disposition: A | Discharge status: Home / Self Care | Payer: Medicare Other | Attending: Emergency Medicine | Admitting: Emergency Medicine

## 2020-04-18 ENCOUNTER — Encounter (HOSPITAL_COMMUNITY): Payer: Self-pay

## 2020-04-18 ENCOUNTER — Telehealth (HOSPITAL_COMMUNITY): Payer: Self-pay | Admitting: *Deleted

## 2020-04-18 DIAGNOSIS — Z590 Homelessness: Secondary | ICD-10-CM | POA: Diagnosis present

## 2020-04-18 DIAGNOSIS — Z5321 Procedure and treatment not carried out due to patient leaving prior to being seen by health care provider: Secondary | ICD-10-CM | POA: Insufficient documentation

## 2020-04-18 DIAGNOSIS — F200 Paranoid schizophrenia: Secondary | ICD-10-CM

## 2020-04-18 NOTE — Telephone Encounter (Signed)
He spends most of his afternoons here in our lobby, staff gives him food and he sleeps. He was at the ED last night to sleep and he has arm band on an a band aid and when asked he indicates he had lab drawn last night. His speech is difficult for writer to understand he is not articulate and he stutters along with garbled speech but when he is understood he is appropriate. [name], peer support took him today to be interviewed for a assisted living arrangement. [name] says his wife wont come with him, she will stay with her mom. He like the place and they are happy to have him, he would have his own room. Several requirements needed before he can be admitted there including FL2.

## 2020-04-18 NOTE — Telephone Encounter (Signed)
PPD test for TB ordered.

## 2020-04-18 NOTE — Addendum Note (Signed)
Addended by: [place] Crane on: 04/18/2020 04:15 PM   Modules accepted: Orders

## 2020-04-18 NOTE — ED Triage Notes (Signed)
Pt states his BP is high today.

## 2020-04-19 NOTE — ED Notes (Signed)
Pt not found in waiting room.

## 2020-04-21 ENCOUNTER — Encounter (HOSPITAL_COMMUNITY): Payer: Self-pay | Admitting: Emergency Medicine

## 2020-04-21 ENCOUNTER — Other Ambulatory Visit: Payer: Self-pay

## 2020-04-21 ENCOUNTER — Emergency Department (HOSPITAL_COMMUNITY)
Admission: EM | Admit: 2020-04-21 | Discharge: 2020-04-22 | Disposition: A | Discharge status: Home / Self Care | Payer: Medicare Other | Source: Home / Self Care

## 2020-04-21 DIAGNOSIS — S9781XA Crushing injury of right foot, initial encounter: Secondary | ICD-10-CM | POA: Diagnosis not present

## 2020-04-21 DIAGNOSIS — S8261XA Displaced fracture of lateral malleolus of right fibula, initial encounter for closed fracture: Secondary | ICD-10-CM | POA: Diagnosis not present

## 2020-04-21 DIAGNOSIS — S92334A Nondisplaced fracture of third metatarsal bone, right foot, initial encounter for closed fracture: Secondary | ICD-10-CM | POA: Diagnosis not present

## 2020-04-21 DIAGNOSIS — D696 Thrombocytopenia, unspecified: Secondary | ICD-10-CM | POA: Diagnosis not present

## 2020-04-21 DIAGNOSIS — E785 Hyperlipidemia, unspecified: Secondary | ICD-10-CM | POA: Diagnosis not present

## 2020-04-21 DIAGNOSIS — E440 Moderate protein-calorie malnutrition: Secondary | ICD-10-CM | POA: Diagnosis not present

## 2020-04-21 DIAGNOSIS — E860 Dehydration: Secondary | ICD-10-CM | POA: Diagnosis not present

## 2020-04-21 DIAGNOSIS — S92324A Nondisplaced fracture of second metatarsal bone, right foot, initial encounter for closed fracture: Secondary | ICD-10-CM | POA: Diagnosis not present

## 2020-04-21 DIAGNOSIS — K219 Gastro-esophageal reflux disease without esophagitis: Secondary | ICD-10-CM | POA: Diagnosis not present

## 2020-04-21 DIAGNOSIS — D631 Anemia in chronic kidney disease: Secondary | ICD-10-CM | POA: Diagnosis not present

## 2020-04-21 DIAGNOSIS — W230XXA Caught, crushed, jammed, or pinched between moving objects, initial encounter: Secondary | ICD-10-CM | POA: Diagnosis present

## 2020-04-21 DIAGNOSIS — S92351A Displaced fracture of fifth metatarsal bone, right foot, initial encounter for closed fracture: Secondary | ICD-10-CM | POA: Diagnosis not present

## 2020-04-21 DIAGNOSIS — F0390 Unspecified dementia without behavioral disturbance: Secondary | ICD-10-CM | POA: Diagnosis not present

## 2020-04-21 DIAGNOSIS — N400 Enlarged prostate without lower urinary tract symptoms: Secondary | ICD-10-CM | POA: Diagnosis not present

## 2020-04-21 DIAGNOSIS — X30XXXA Exposure to excessive natural heat, initial encounter: Secondary | ICD-10-CM | POA: Diagnosis not present

## 2020-04-21 DIAGNOSIS — M6282 Rhabdomyolysis: Secondary | ICD-10-CM | POA: Diagnosis not present

## 2020-04-21 DIAGNOSIS — I10 Essential (primary) hypertension: Secondary | ICD-10-CM | POA: Insufficient documentation

## 2020-04-21 DIAGNOSIS — F200 Paranoid schizophrenia: Secondary | ICD-10-CM | POA: Diagnosis not present

## 2020-04-21 DIAGNOSIS — G9340 Encephalopathy, unspecified: Secondary | ICD-10-CM | POA: Diagnosis not present

## 2020-04-21 DIAGNOSIS — I130 Hypertensive heart and chronic kidney disease with heart failure and stage 1 through stage 4 chronic kidney disease, or unspecified chronic kidney disease: Secondary | ICD-10-CM | POA: Diagnosis not present

## 2020-04-21 DIAGNOSIS — K7460 Unspecified cirrhosis of liver: Secondary | ICD-10-CM | POA: Diagnosis not present

## 2020-04-21 DIAGNOSIS — N179 Acute kidney failure, unspecified: Secondary | ICD-10-CM | POA: Diagnosis not present

## 2020-04-21 DIAGNOSIS — G92 Toxic encephalopathy: Secondary | ICD-10-CM | POA: Diagnosis not present

## 2020-04-21 DIAGNOSIS — R001 Bradycardia, unspecified: Secondary | ICD-10-CM | POA: Diagnosis not present

## 2020-04-21 DIAGNOSIS — T6709XA Other heatstroke and sunstroke, initial encounter: Secondary | ICD-10-CM | POA: Diagnosis not present

## 2020-04-21 DIAGNOSIS — S92344A Nondisplaced fracture of fourth metatarsal bone, right foot, initial encounter for closed fracture: Secondary | ICD-10-CM | POA: Diagnosis not present

## 2020-04-21 DIAGNOSIS — Z20822 Contact with and (suspected) exposure to covid-19: Secondary | ICD-10-CM | POA: Diagnosis not present

## 2020-04-21 DIAGNOSIS — Z5321 Procedure and treatment not carried out due to patient leaving prior to being seen by health care provider: Secondary | ICD-10-CM | POA: Insufficient documentation

## 2020-04-21 DIAGNOSIS — I5032 Chronic diastolic (congestive) heart failure: Secondary | ICD-10-CM | POA: Diagnosis not present

## 2020-04-21 NOTE — ED Triage Notes (Signed)
Pt requesting meds for high blood pressure.

## 2020-04-21 NOTE — ED Notes (Signed)
Pt did not answer to vitals recheck after being called multiple times

## 2020-04-22 NOTE — ED Notes (Signed)
Pt not in waiting room

## 2020-04-23 ENCOUNTER — Inpatient Hospital Stay (HOSPITAL_COMMUNITY)
Admission: EM | Admit: 2020-04-23 | Discharge: 2020-05-09 | DRG: 907 | Disposition: A | Discharge status: Nursing Home (Includes ICF) | Payer: Medicare Other | Attending: Family Medicine | Admitting: Family Medicine

## 2020-04-23 ENCOUNTER — Ambulatory Visit (HOSPITAL_COMMUNITY): Payer: Medicare Other

## 2020-04-23 ENCOUNTER — Other Ambulatory Visit: Payer: Self-pay

## 2020-04-23 ENCOUNTER — Encounter (HOSPITAL_COMMUNITY): Payer: Self-pay

## 2020-04-23 ENCOUNTER — Emergency Department (HOSPITAL_COMMUNITY): Payer: Medicare Other

## 2020-04-23 ENCOUNTER — Emergency Department (HOSPITAL_COMMUNITY)
Admission: EM | Admit: 2020-04-23 | Discharge: 2020-04-23 | Disposition: A | Discharge status: Home / Self Care | Payer: Medicare Other | Source: Home / Self Care | Attending: Emergency Medicine | Admitting: Emergency Medicine

## 2020-04-23 DIAGNOSIS — R509 Fever, unspecified: Secondary | ICD-10-CM

## 2020-04-23 DIAGNOSIS — I5032 Chronic diastolic (congestive) heart failure: Secondary | ICD-10-CM | POA: Diagnosis present

## 2020-04-23 DIAGNOSIS — G9340 Encephalopathy, unspecified: Secondary | ICD-10-CM | POA: Diagnosis not present

## 2020-04-23 DIAGNOSIS — I130 Hypertensive heart and chronic kidney disease with heart failure and stage 1 through stage 4 chronic kidney disease, or unspecified chronic kidney disease: Secondary | ICD-10-CM | POA: Diagnosis present

## 2020-04-23 DIAGNOSIS — S8991XA Unspecified injury of right lower leg, initial encounter: Secondary | ICD-10-CM

## 2020-04-23 DIAGNOSIS — K219 Gastro-esophageal reflux disease without esophagitis: Secondary | ICD-10-CM | POA: Diagnosis present

## 2020-04-23 DIAGNOSIS — S93326A Dislocation of tarsometatarsal joint of unspecified foot, initial encounter: Secondary | ICD-10-CM

## 2020-04-23 DIAGNOSIS — D631 Anemia in chronic kidney disease: Secondary | ICD-10-CM | POA: Diagnosis present

## 2020-04-23 DIAGNOSIS — Z6820 Body mass index (BMI) 20.0-20.9, adult: Secondary | ICD-10-CM

## 2020-04-23 DIAGNOSIS — S92351A Displaced fracture of fifth metatarsal bone, right foot, initial encounter for closed fracture: Secondary | ICD-10-CM | POA: Diagnosis present

## 2020-04-23 DIAGNOSIS — Y998 Other external cause status: Secondary | ICD-10-CM | POA: Insufficient documentation

## 2020-04-23 DIAGNOSIS — T679XXA Effect of heat and light, unspecified, initial encounter: Secondary | ICD-10-CM | POA: Diagnosis not present

## 2020-04-23 DIAGNOSIS — Z823 Family history of stroke: Secondary | ICD-10-CM

## 2020-04-23 DIAGNOSIS — N179 Acute kidney failure, unspecified: Secondary | ICD-10-CM | POA: Diagnosis present

## 2020-04-23 DIAGNOSIS — Z8719 Personal history of other diseases of the digestive system: Secondary | ICD-10-CM

## 2020-04-23 DIAGNOSIS — R001 Bradycardia, unspecified: Secondary | ICD-10-CM | POA: Diagnosis not present

## 2020-04-23 DIAGNOSIS — S92334A Nondisplaced fracture of third metatarsal bone, right foot, initial encounter for closed fracture: Secondary | ICD-10-CM | POA: Diagnosis present

## 2020-04-23 DIAGNOSIS — N1831 Chronic kidney disease, stage 3a: Secondary | ICD-10-CM | POA: Diagnosis present

## 2020-04-23 DIAGNOSIS — F1010 Alcohol abuse, uncomplicated: Secondary | ICD-10-CM | POA: Diagnosis present

## 2020-04-23 DIAGNOSIS — Z79899 Other long term (current) drug therapy: Secondary | ICD-10-CM | POA: Insufficient documentation

## 2020-04-23 DIAGNOSIS — Z720 Tobacco use: Secondary | ICD-10-CM | POA: Diagnosis present

## 2020-04-23 DIAGNOSIS — W230XXA Caught, crushed, jammed, or pinched between moving objects, initial encounter: Secondary | ICD-10-CM | POA: Diagnosis present

## 2020-04-23 DIAGNOSIS — Y9389 Activity, other specified: Secondary | ICD-10-CM | POA: Insufficient documentation

## 2020-04-23 DIAGNOSIS — F0391 Unspecified dementia with behavioral disturbance: Secondary | ICD-10-CM

## 2020-04-23 DIAGNOSIS — R6510 Systemic inflammatory response syndrome (SIRS) of non-infectious origin without acute organ dysfunction: Secondary | ICD-10-CM

## 2020-04-23 DIAGNOSIS — N400 Enlarged prostate without lower urinary tract symptoms: Secondary | ICD-10-CM | POA: Diagnosis present

## 2020-04-23 DIAGNOSIS — Z781 Physical restraint status: Secondary | ICD-10-CM

## 2020-04-23 DIAGNOSIS — E440 Moderate protein-calorie malnutrition: Secondary | ICD-10-CM | POA: Diagnosis present

## 2020-04-23 DIAGNOSIS — E785 Hyperlipidemia, unspecified: Secondary | ICD-10-CM | POA: Diagnosis present

## 2020-04-23 DIAGNOSIS — M25561 Pain in right knee: Secondary | ICD-10-CM | POA: Diagnosis present

## 2020-04-23 DIAGNOSIS — G9341 Metabolic encephalopathy: Secondary | ICD-10-CM

## 2020-04-23 DIAGNOSIS — X30XXXA Exposure to excessive natural heat, initial encounter: Secondary | ICD-10-CM

## 2020-04-23 DIAGNOSIS — D696 Thrombocytopenia, unspecified: Secondary | ICD-10-CM | POA: Diagnosis present

## 2020-04-23 DIAGNOSIS — I110 Hypertensive heart disease with heart failure: Secondary | ICD-10-CM | POA: Insufficient documentation

## 2020-04-23 DIAGNOSIS — F200 Paranoid schizophrenia: Secondary | ICD-10-CM | POA: Diagnosis present

## 2020-04-23 DIAGNOSIS — Y9289 Other specified places as the place of occurrence of the external cause: Secondary | ICD-10-CM | POA: Insufficient documentation

## 2020-04-23 DIAGNOSIS — G92 Toxic encephalopathy: Secondary | ICD-10-CM | POA: Diagnosis present

## 2020-04-23 DIAGNOSIS — E7800 Pure hypercholesterolemia, unspecified: Secondary | ICD-10-CM | POA: Diagnosis present

## 2020-04-23 DIAGNOSIS — Z8249 Family history of ischemic heart disease and other diseases of the circulatory system: Secondary | ICD-10-CM

## 2020-04-23 DIAGNOSIS — S8261XA Displaced fracture of lateral malleolus of right fibula, initial encounter for closed fracture: Secondary | ICD-10-CM | POA: Diagnosis present

## 2020-04-23 DIAGNOSIS — E860 Dehydration: Secondary | ICD-10-CM | POA: Diagnosis present

## 2020-04-23 DIAGNOSIS — Z20822 Contact with and (suspected) exposure to covid-19: Secondary | ICD-10-CM | POA: Diagnosis present

## 2020-04-23 DIAGNOSIS — S92344A Nondisplaced fracture of fourth metatarsal bone, right foot, initial encounter for closed fracture: Secondary | ICD-10-CM | POA: Diagnosis present

## 2020-04-23 DIAGNOSIS — K739 Chronic hepatitis, unspecified: Secondary | ICD-10-CM

## 2020-04-23 DIAGNOSIS — Z7982 Long term (current) use of aspirin: Secondary | ICD-10-CM | POA: Insufficient documentation

## 2020-04-23 DIAGNOSIS — S92324A Nondisplaced fracture of second metatarsal bone, right foot, initial encounter for closed fracture: Secondary | ICD-10-CM | POA: Diagnosis present

## 2020-04-23 DIAGNOSIS — T6709XA Other heatstroke and sunstroke, initial encounter: Principal | ICD-10-CM | POA: Diagnosis present

## 2020-04-23 DIAGNOSIS — Z590 Homelessness: Secondary | ICD-10-CM

## 2020-04-23 DIAGNOSIS — F17210 Nicotine dependence, cigarettes, uncomplicated: Secondary | ICD-10-CM | POA: Diagnosis present

## 2020-04-23 DIAGNOSIS — I10 Essential (primary) hypertension: Secondary | ICD-10-CM | POA: Diagnosis present

## 2020-04-23 DIAGNOSIS — F0390 Unspecified dementia without behavioral disturbance: Secondary | ICD-10-CM | POA: Diagnosis present

## 2020-04-23 DIAGNOSIS — G8929 Other chronic pain: Secondary | ICD-10-CM | POA: Diagnosis present

## 2020-04-23 DIAGNOSIS — M6282 Rhabdomyolysis: Secondary | ICD-10-CM | POA: Diagnosis present

## 2020-04-23 DIAGNOSIS — K7460 Unspecified cirrhosis of liver: Secondary | ICD-10-CM | POA: Diagnosis present

## 2020-04-23 DIAGNOSIS — F03918 Unspecified dementia, unspecified severity, with other behavioral disturbance: Secondary | ICD-10-CM

## 2020-04-23 DIAGNOSIS — Z8619 Personal history of other infectious and parasitic diseases: Secondary | ICD-10-CM

## 2020-04-23 DIAGNOSIS — Z833 Family history of diabetes mellitus: Secondary | ICD-10-CM

## 2020-04-23 DIAGNOSIS — T148XXA Other injury of unspecified body region, initial encounter: Secondary | ICD-10-CM

## 2020-04-23 DIAGNOSIS — S93324A Dislocation of tarsometatarsal joint of right foot, initial encounter: Secondary | ICD-10-CM

## 2020-04-23 DIAGNOSIS — I4891 Unspecified atrial fibrillation: Secondary | ICD-10-CM | POA: Diagnosis present

## 2020-04-23 DIAGNOSIS — F329 Major depressive disorder, single episode, unspecified: Secondary | ICD-10-CM | POA: Diagnosis present

## 2020-04-23 DIAGNOSIS — S9781XA Crushing injury of right foot, initial encounter: Secondary | ICD-10-CM | POA: Diagnosis present

## 2020-04-23 DIAGNOSIS — I5030 Unspecified diastolic (congestive) heart failure: Secondary | ICD-10-CM | POA: Insufficient documentation

## 2020-04-23 HISTORY — DX: Encephalopathy, unspecified: G93.40

## 2020-04-23 HISTORY — DX: Metabolic encephalopathy: G93.41

## 2020-04-23 LAB — CBC WITH DIFFERENTIAL/PLATELET
Abs Immature Granulocytes: 0.04 10*3/uL (ref 0.00–0.07)
Basophils Absolute: 0 10*3/uL (ref 0.0–0.1)
Basophils Relative: 0 %
Eosinophils Absolute: 0.2 10*3/uL (ref 0.0–0.5)
Eosinophils Relative: 3 %
HCT: 36.6 % — ABNORMAL LOW (ref 39.0–52.0)
Hemoglobin: 12.1 g/dL — ABNORMAL LOW (ref 13.0–17.0)
Immature Granulocytes: 1 %
Lymphocytes Relative: 11 %
Lymphs Abs: 0.8 10*3/uL (ref 0.7–4.0)
MCH: 29.9 pg (ref 26.0–34.0)
MCHC: 33.1 g/dL (ref 30.0–36.0)
MCV: 90.4 fL (ref 80.0–100.0)
Monocytes Absolute: 0.5 10*3/uL (ref 0.1–1.0)
Monocytes Relative: 7 %
Neutro Abs: 5.7 10*3/uL (ref 1.7–7.7)
Neutrophils Relative %: 78 %
Platelets: 97 10*3/uL — ABNORMAL LOW (ref 150–400)
RBC: 4.05 MIL/uL — ABNORMAL LOW (ref 4.22–5.81)
RDW: 13.3 % (ref 11.5–15.5)
WBC: 7.3 10*3/uL (ref 4.0–10.5)
nRBC: 0 % (ref 0.0–0.2)

## 2020-04-23 LAB — COMPREHENSIVE METABOLIC PANEL
ALT: 17 U/L (ref 0–44)
AST: 31 U/L (ref 15–41)
Albumin: 4.5 g/dL (ref 3.5–5.0)
Alkaline Phosphatase: 37 U/L — ABNORMAL LOW (ref 38–126)
Anion gap: 10 (ref 5–15)
BUN: 20 mg/dL (ref 8–23)
CO2: 28 mmol/L (ref 22–32)
Calcium: 9.4 mg/dL (ref 8.9–10.3)
Chloride: 105 mmol/L (ref 98–111)
Creatinine, Ser: 1.77 mg/dL — ABNORMAL HIGH (ref 0.61–1.24)
GFR calc Af Amer: 45 mL/min — ABNORMAL LOW (ref >60)
GFR calc non Af Amer: 39 mL/min — ABNORMAL LOW (ref >60)
Glucose, Bld: 113 mg/dL — ABNORMAL HIGH (ref 70–99)
Potassium: 4 mmol/L (ref 3.5–5.1)
Sodium: 143 mmol/L (ref 135–145)
Total Bilirubin: 1.5 mg/dL — ABNORMAL HIGH (ref 0.3–1.2)
Total Protein: 7.2 g/dL (ref 6.5–8.1)

## 2020-04-23 LAB — URINALYSIS, ROUTINE W REFLEX MICROSCOPIC
Bilirubin Urine: NEGATIVE
Glucose, UA: NEGATIVE mg/dL
Hgb urine dipstick: NEGATIVE
Ketones, ur: NEGATIVE mg/dL
Leukocytes,Ua: NEGATIVE
Nitrite: NEGATIVE
Protein, ur: NEGATIVE mg/dL
Specific Gravity, Urine: 1.016 (ref 1.005–1.030)
pH: 6 (ref 5.0–8.0)

## 2020-04-23 LAB — LACTIC ACID, PLASMA: Lactic Acid, Venous: 1.8 mmol/L (ref 0.5–1.9)

## 2020-04-23 LAB — ETHANOL: Alcohol, Ethyl (B): <10 mg/dL (ref <10)

## 2020-04-23 LAB — SARS CORONAVIRUS 2 BY RT PCR (HOSPITAL ORDER, PERFORMED IN ~~LOC~~ HOSPITAL LAB): SARS Coronavirus 2: NEGATIVE

## 2020-04-23 LAB — CK: Total CK: 489 U/L — ABNORMAL HIGH (ref 49–397)

## 2020-04-23 MED ORDER — SODIUM CHLORIDE 0.9 % IV BOLUS
1000.0000 mL | Freq: Once | INTRAVENOUS | Status: AC
  Administered 2020-04-23: 1000 mL via INTRAVENOUS

## 2020-04-23 MED ORDER — VANCOMYCIN HCL 750 MG/150ML IV SOLN
750.0000 mg | INTRAVENOUS | Status: DC
  Administered 2020-04-24: 750 mg via INTRAVENOUS
  Filled 2020-04-23 (×3): qty 150

## 2020-04-23 MED ORDER — SODIUM CHLORIDE 0.9 % IV SOLN
2.0000 g | Freq: Two times a day (BID) | INTRAVENOUS | Status: DC
  Administered 2020-04-24 (×3): 2 g via INTRAVENOUS
  Filled 2020-04-23 (×3): qty 2

## 2020-04-23 MED ORDER — ACETAMINOPHEN 650 MG RE SUPP: 650.0000 mg | Freq: Once | RECTAL | Status: DC

## 2020-04-23 MED ORDER — ACETAMINOPHEN 500 MG PO TABS
500.0000 mg | ORAL_TABLET | Freq: Once | ORAL | Status: AC
  Administered 2020-04-23: 500 mg via ORAL
  Filled 2020-04-23: qty 1

## 2020-04-23 MED ORDER — SODIUM CHLORIDE 0.9 % IV BOLUS
500.0000 mL | Freq: Once | INTRAVENOUS | Status: AC
  Administered 2020-04-23: 500 mL via INTRAVENOUS

## 2020-04-23 MED ORDER — VANCOMYCIN HCL 1250 MG/250ML IV SOLN
1250.0000 mg | Freq: Once | INTRAVENOUS | Status: AC
  Administered 2020-04-24: 1250 mg via INTRAVENOUS
  Filled 2020-04-23: qty 250

## 2020-04-23 MED ORDER — NALOXONE HCL 0.4 MG/ML IJ SOLN: 0.4000 mg | INTRAMUSCULAR | Status: DC | PRN
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
## 2020-04-23 NOTE — Progress Notes (Signed)
Pharmacy Antibiotic Note  [name] is a 69 y.o. male admitted on 04/23/2020 with sepsis.  Pharmacy has been consulted for cefepime and Vancomycin dosing.   Height: 5\' 9"  (175.3 cm) Weight: 62 kg (136 lb 11 oz) IBW/kg (Calculated) : 70.7  Temp (24hrs), Avg:99.7 F (37.6 C), Min:98.6 F (37 C), Max:101.1 F (38.4 C)  Recent Labs  Lab 04/17/20 0302 04/23/20 1713 04/23/20 1715  WBC 5.2 7.3  --   CREATININE 1.53* 1.77*  --   LATICACIDVEN  --   --  1.8    Estimated Creatinine Clearance: 35 mL/min (A) (by C-G formula based on SCr of 1.77 mg/dL (H)).    Allergies  Allergen Reactions  . Lisinopril Other (See Comments)    Cough  . Penicillins Hives  . Amoxicillin Rash  . Ibuprofen Rash    Antimicrobials this admission: 9/7 Cefepime >>  9/7 Vancomycin >>   Dose adjustments this admission: N/a  Microbiology results: Pending   Plan:  - Cefepime 2g IV q12h - Vancomycin 1250 mg IV x 1 dose  - Followed by Vancomycin 750mg  IV q24h - goal trough ~ 15 - Monitor patients renal function and urine output  - De-escalate ABX when appropriate   Thank you for allowing pharmacy to be a part of this patient's care.  Duanne Limerick PharmD. BCPS 04/23/2020 11:28 PM

## 2020-04-23 NOTE — Discharge Instructions (Signed)
Please read and follow all provided instructions.  You have been seen today for an injury to your right leg  Tests performed today include: X-rays of the affected areas - does NOT show any broken bones or dislocations.  Vital signs. See below for your results today.   Home care instructions: -- *PRICE in the first 24-48 hours after injury: Protect (with brace, splint, sling), if given by your provider Rest Ice- Do not apply ice pack directly to your skin, place towel or similar between your skin and ice/ice pack. Apply ice for 20 min, then remove for 40 min while awake Compression- Wear brace, elastic bandage, splint as directed by your provider Elevate affected extremity above the level of your heart when not walking around for the first 24-48 hours   Follow-up instructions: Please follow-up with your primary care provider or the provided orthopedic physician (bone specialist) if you continue to have significant pain in 1 week. In this case you may have a more severe injury that requires further care.   Return instructions:  Please return if your digits or extremity are numb or tingling, appear gray or blue, or you have severe pain (also elevate the extremity and loosen splint or wrap if you were given one) Please return if you have redness or fevers.  Please return to the Emergency Department if you experience worsening symptoms.  Please return if you have any other emergent concerns. Additional Information:  Your vital signs today were: BP (!) 172/100 (BP Location: Right Arm)   Pulse 80   Temp 98.6 F (37 C) (Oral)   Resp 18   SpO2 100%  If your blood pressure (BP) was elevated above 135/85 this visit, please have this repeated by your doctor within one month. ---------------

## 2020-04-23 NOTE — ED Triage Notes (Signed)
Pt BIB [facility] for eval of heat exposure and episode of unresponsiveness. Pt was reportedly laying on his porch all day today, initially unresponsive on EMS arrival, and now coming around on arrival. Pt shivering on arrival, well known to this ED. Pt does appear more lethargic than usual.
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
## 2020-04-23 NOTE — Progress Notes (Signed)
Orthopedic Tech Progress Note Patient Details:  PAULANTHONY GLEAVES 11/08/1950 [id number]  Ortho Devices Type of Ortho Device: ASO Ortho Device/Splint Location: Right Ankle Ortho Device/Splint Interventions: Application, Adjustment   Post Interventions Patient Tolerated: Well Instructions Provided: Adjustment of device, Poper ambulation with device   Izadora Roehr E Sanel Stemmer 04/23/2020, 2:03 AM
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
## 2020-04-24 ENCOUNTER — Observation Stay (HOSPITAL_COMMUNITY): Payer: Medicare Other

## 2020-04-24 ENCOUNTER — Observation Stay (HOSPITAL_BASED_OUTPATIENT_CLINIC_OR_DEPARTMENT_OTHER): Payer: Medicare Other

## 2020-04-24 ENCOUNTER — Inpatient Hospital Stay (HOSPITAL_COMMUNITY): Payer: Medicare Other

## 2020-04-24 ENCOUNTER — Encounter (HOSPITAL_COMMUNITY): Payer: Self-pay | Admitting: Family

## 2020-04-24 DIAGNOSIS — S8261XA Displaced fracture of lateral malleolus of right fibula, initial encounter for closed fracture: Secondary | ICD-10-CM | POA: Diagnosis present

## 2020-04-24 DIAGNOSIS — M79609 Pain in unspecified limb: Secondary | ICD-10-CM

## 2020-04-24 DIAGNOSIS — I130 Hypertensive heart and chronic kidney disease with heart failure and stage 1 through stage 4 chronic kidney disease, or unspecified chronic kidney disease: Secondary | ICD-10-CM | POA: Diagnosis present

## 2020-04-24 DIAGNOSIS — S92301A Fracture of unspecified metatarsal bone(s), right foot, initial encounter for closed fracture: Secondary | ICD-10-CM | POA: Diagnosis not present

## 2020-04-24 DIAGNOSIS — E860 Dehydration: Secondary | ICD-10-CM | POA: Diagnosis present

## 2020-04-24 DIAGNOSIS — K739 Chronic hepatitis, unspecified: Secondary | ICD-10-CM | POA: Diagnosis not present

## 2020-04-24 DIAGNOSIS — I361 Nonrheumatic tricuspid (valve) insufficiency: Secondary | ICD-10-CM | POA: Diagnosis not present

## 2020-04-24 DIAGNOSIS — X30XXXA Exposure to excessive natural heat, initial encounter: Secondary | ICD-10-CM | POA: Diagnosis not present

## 2020-04-24 DIAGNOSIS — I10 Essential (primary) hypertension: Secondary | ICD-10-CM | POA: Diagnosis not present

## 2020-04-24 DIAGNOSIS — N400 Enlarged prostate without lower urinary tract symptoms: Secondary | ICD-10-CM | POA: Diagnosis not present

## 2020-04-24 DIAGNOSIS — E440 Moderate protein-calorie malnutrition: Secondary | ICD-10-CM | POA: Diagnosis present

## 2020-04-24 DIAGNOSIS — I4891 Unspecified atrial fibrillation: Secondary | ICD-10-CM | POA: Diagnosis present

## 2020-04-24 DIAGNOSIS — R509 Fever, unspecified: Secondary | ICD-10-CM

## 2020-04-24 DIAGNOSIS — G9340 Encephalopathy, unspecified: Secondary | ICD-10-CM | POA: Diagnosis present

## 2020-04-24 DIAGNOSIS — S9781XA Crushing injury of right foot, initial encounter: Secondary | ICD-10-CM | POA: Diagnosis not present

## 2020-04-24 DIAGNOSIS — E785 Hyperlipidemia, unspecified: Secondary | ICD-10-CM | POA: Diagnosis present

## 2020-04-24 DIAGNOSIS — M7989 Other specified soft tissue disorders: Secondary | ICD-10-CM

## 2020-04-24 DIAGNOSIS — I5032 Chronic diastolic (congestive) heart failure: Secondary | ICD-10-CM | POA: Diagnosis present

## 2020-04-24 DIAGNOSIS — I351 Nonrheumatic aortic (valve) insufficiency: Secondary | ICD-10-CM | POA: Diagnosis not present

## 2020-04-24 DIAGNOSIS — T679XXA Effect of heat and light, unspecified, initial encounter: Secondary | ICD-10-CM

## 2020-04-24 DIAGNOSIS — I340 Nonrheumatic mitral (valve) insufficiency: Secondary | ICD-10-CM | POA: Diagnosis not present

## 2020-04-24 DIAGNOSIS — K219 Gastro-esophageal reflux disease without esophagitis: Secondary | ICD-10-CM | POA: Diagnosis not present

## 2020-04-24 DIAGNOSIS — F0390 Unspecified dementia without behavioral disturbance: Secondary | ICD-10-CM

## 2020-04-24 DIAGNOSIS — G92 Toxic encephalopathy: Secondary | ICD-10-CM | POA: Diagnosis present

## 2020-04-24 DIAGNOSIS — S92334A Nondisplaced fracture of third metatarsal bone, right foot, initial encounter for closed fracture: Secondary | ICD-10-CM | POA: Diagnosis not present

## 2020-04-24 DIAGNOSIS — M6282 Rhabdomyolysis: Secondary | ICD-10-CM | POA: Diagnosis present

## 2020-04-24 DIAGNOSIS — K7460 Unspecified cirrhosis of liver: Secondary | ICD-10-CM | POA: Diagnosis present

## 2020-04-24 DIAGNOSIS — S92344A Nondisplaced fracture of fourth metatarsal bone, right foot, initial encounter for closed fracture: Secondary | ICD-10-CM | POA: Diagnosis present

## 2020-04-24 DIAGNOSIS — F200 Paranoid schizophrenia: Secondary | ICD-10-CM | POA: Diagnosis present

## 2020-04-24 DIAGNOSIS — T148XXA Other injury of unspecified body region, initial encounter: Secondary | ICD-10-CM

## 2020-04-24 DIAGNOSIS — W230XXA Caught, crushed, jammed, or pinched between moving objects, initial encounter: Secondary | ICD-10-CM | POA: Diagnosis present

## 2020-04-24 DIAGNOSIS — N179 Acute kidney failure, unspecified: Secondary | ICD-10-CM | POA: Diagnosis present

## 2020-04-24 DIAGNOSIS — D631 Anemia in chronic kidney disease: Secondary | ICD-10-CM | POA: Diagnosis present

## 2020-04-24 DIAGNOSIS — S92351D Displaced fracture of fifth metatarsal bone, right foot, subsequent encounter for fracture with routine healing: Secondary | ICD-10-CM | POA: Diagnosis not present

## 2020-04-24 DIAGNOSIS — S92351A Displaced fracture of fifth metatarsal bone, right foot, initial encounter for closed fracture: Secondary | ICD-10-CM | POA: Diagnosis not present

## 2020-04-24 DIAGNOSIS — R001 Bradycardia, unspecified: Secondary | ICD-10-CM | POA: Diagnosis not present

## 2020-04-24 DIAGNOSIS — S92324A Nondisplaced fracture of second metatarsal bone, right foot, initial encounter for closed fracture: Secondary | ICD-10-CM | POA: Diagnosis not present

## 2020-04-24 DIAGNOSIS — Z720 Tobacco use: Secondary | ICD-10-CM | POA: Diagnosis not present

## 2020-04-24 DIAGNOSIS — T6709XA Other heatstroke and sunstroke, initial encounter: Secondary | ICD-10-CM | POA: Diagnosis present

## 2020-04-24 DIAGNOSIS — Z20822 Contact with and (suspected) exposure to covid-19: Secondary | ICD-10-CM | POA: Diagnosis present

## 2020-04-24 DIAGNOSIS — D696 Thrombocytopenia, unspecified: Secondary | ICD-10-CM | POA: Diagnosis present

## 2020-04-24 DIAGNOSIS — S93324A Dislocation of tarsometatarsal joint of right foot, initial encounter: Secondary | ICD-10-CM | POA: Diagnosis not present

## 2020-04-24 LAB — RESPIRATORY PANEL BY PCR

## 2020-04-24 LAB — CK
Total CK: 778 U/L — ABNORMAL HIGH (ref 49–397)
Total CK: 881 U/L — ABNORMAL HIGH (ref 49–397)
Total CK: 764 U/L — ABNORMAL HIGH (ref 49–397)

## 2020-04-24 LAB — MAGNESIUM
Magnesium: 1.9 mg/dL (ref 1.7–2.4)
Magnesium: 1.7 mg/dL (ref 1.7–2.4)

## 2020-04-24 LAB — PHOSPHORUS
Phosphorus: 3 mg/dL (ref 2.5–4.6)
Phosphorus: 2.6 mg/dL (ref 2.5–4.6)

## 2020-04-24 LAB — I-STAT VENOUS BLOOD GAS, ED
Acid-Base Excess: 2 mmol/L (ref 0.0–2.0)
Bicarbonate: 28 mmol/L (ref 20.0–28.0)
Calcium, Ion: 1.13 mmol/L — ABNORMAL LOW (ref 1.15–1.40)
HCT: 39 % (ref 39.0–52.0)
Hemoglobin: 13.3 g/dL (ref 13.0–17.0)
O2 Saturation: 99 %
Potassium: 3.6 mmol/L (ref 3.5–5.1)
Sodium: 143 mmol/L (ref 135–145)
TCO2: 29 mmol/L (ref 22–32)
pCO2, Ven: 45.9 mmHg (ref 44.0–60.0)
pH, Ven: 7.393 (ref 7.250–7.430)
pO2, Ven: 135 mmHg — ABNORMAL HIGH (ref 32.0–45.0)

## 2020-04-24 LAB — ECHOCARDIOGRAM COMPLETE
Area-P 1/2: 3.76 cm2
Height: 69 in
P 1/2 time: 500 msec
S' Lateral: 3 cm
Weight: 2186.96 oz

## 2020-04-24 LAB — URINE CULTURE

## 2020-04-24 LAB — PROCALCITONIN: Procalcitonin: <0.1 ng/mL

## 2020-04-24 LAB — PROTIME-INR
INR: 1.1 (ref 0.8–1.2)
Prothrombin Time: 13.8 seconds (ref 11.4–15.2)

## 2020-04-24 LAB — BASIC METABOLIC PANEL
Anion gap: 14 (ref 5–15)
BUN: 14 mg/dL (ref 8–23)
CO2: 19 mmol/L — ABNORMAL LOW (ref 22–32)
Calcium: 8.4 mg/dL — ABNORMAL LOW (ref 8.9–10.3)
Chloride: 109 mmol/L (ref 98–111)
Creatinine, Ser: 1.33 mg/dL — ABNORMAL HIGH (ref 0.61–1.24)
GFR calc Af Amer: >60 mL/min (ref >60)
GFR calc non Af Amer: 55 mL/min — ABNORMAL LOW (ref >60)
Glucose, Bld: 74 mg/dL (ref 70–99)
Potassium: 3.8 mmol/L (ref 3.5–5.1)
Sodium: 142 mmol/L (ref 135–145)

## 2020-04-24 LAB — CBC WITH DIFFERENTIAL/PLATELET
Abs Immature Granulocytes: 0.03 10*3/uL (ref 0.00–0.07)
Basophils Absolute: 0 10*3/uL (ref 0.0–0.1)
Basophils Relative: 0 %
Eosinophils Absolute: 0 10*3/uL (ref 0.0–0.5)
Eosinophils Relative: 0 %
HCT: 35.9 % — ABNORMAL LOW (ref 39.0–52.0)
Hemoglobin: 11.4 g/dL — ABNORMAL LOW (ref 13.0–17.0)
Immature Granulocytes: 1 %
Lymphocytes Relative: 17 %
Lymphs Abs: 1.1 10*3/uL (ref 0.7–4.0)
MCH: 29.2 pg (ref 26.0–34.0)
MCHC: 31.8 g/dL (ref 30.0–36.0)
MCV: 92.1 fL (ref 80.0–100.0)
Monocytes Absolute: 0.4 10*3/uL (ref 0.1–1.0)
Monocytes Relative: 7 %
Neutro Abs: 4.8 10*3/uL (ref 1.7–7.7)
Neutrophils Relative %: 75 %
Platelets: 83 10*3/uL — ABNORMAL LOW (ref 150–400)
RBC: 3.9 MIL/uL — ABNORMAL LOW (ref 4.22–5.81)
RDW: 13.6 % (ref 11.5–15.5)
WBC: 6.4 10*3/uL (ref 4.0–10.5)
nRBC: 0 % (ref 0.0–0.2)

## 2020-04-24 LAB — RAPID URINE DRUG SCREEN, HOSP PERFORMED
Amphetamines: NOT DETECTED
Barbiturates: NOT DETECTED
Benzodiazepines: NOT DETECTED
Cocaine: NOT DETECTED
Opiates: NOT DETECTED
Tetrahydrocannabinol: NOT DETECTED

## 2020-04-24 LAB — SODIUM, URINE, RANDOM: Sodium, Ur: 102 mmol/L

## 2020-04-24 LAB — HEPATITIS B SURFACE ANTIGEN: Hepatitis B Surface Ag: NONREACTIVE

## 2020-04-24 LAB — LACTATE DEHYDROGENASE: LDH: 204 U/L — ABNORMAL HIGH (ref 98–192)

## 2020-04-24 LAB — D-DIMER, QUANTITATIVE: D-Dimer, Quant: 7.56 ug/mL-FEU — ABNORMAL HIGH (ref 0.00–0.50)

## 2020-04-24 LAB — RPR: RPR Ser Ql: NONREACTIVE

## 2020-04-24 LAB — TROPONIN I (HIGH SENSITIVITY)
Troponin I (High Sensitivity): 34 ng/L — ABNORMAL HIGH (ref <18)
Troponin I (High Sensitivity): 39 ng/L — ABNORMAL HIGH (ref <18)

## 2020-04-24 LAB — CREATININE, URINE, RANDOM: Creatinine, Urine: 170.37 mg/dL

## 2020-04-24 LAB — LACTIC ACID, PLASMA
Lactic Acid, Venous: 1.5 mmol/L (ref 0.5–1.9)
Lactic Acid, Venous: 2.2 mmol/L (ref 0.5–1.9)
Lactic Acid, Venous: 0.6 mmol/L (ref 0.5–1.9)

## 2020-04-24 LAB — SEDIMENTATION RATE: Sed Rate: 3 mm/hr (ref 0–16)

## 2020-04-24 LAB — HIV ANTIBODY (ROUTINE TESTING W REFLEX): HIV Screen 4th Generation wRfx: NONREACTIVE

## 2020-04-24 LAB — HEPATITIS B CORE ANTIBODY, TOTAL: Hep B Core Total Ab: REACTIVE — AB

## 2020-04-24 LAB — AMMONIA: Ammonia: 51 umol/L — ABNORMAL HIGH (ref 9–35)

## 2020-04-24 LAB — TSH: TSH: 0.85 u[IU]/mL (ref 0.350–4.500)

## 2020-04-24 LAB — C-REACTIVE PROTEIN: CRP: 7.3 mg/dL — ABNORMAL HIGH (ref <1.0)

## 2020-04-24 MED ORDER — FOLIC ACID 1 MG PO TABS
1.0000 mg | ORAL_TABLET | Freq: Every day | ORAL | Status: DC
  Administered 2020-04-24 – 2020-05-09 (×16): 1 mg via ORAL
  Filled 2020-04-24 (×16): qty 1

## 2020-04-24 MED ORDER — IOHEXOL 350 MG/ML SOLN
75.0000 mL | Freq: Once | INTRAVENOUS | Status: AC | PRN
  Administered 2020-04-24: 75 mL via INTRAVENOUS

## 2020-04-24 MED ORDER — BENZTROPINE MESYLATE 1 MG PO TABS
1.0000 mg | ORAL_TABLET | Freq: Two times a day (BID) | ORAL | Status: DC
  Administered 2020-04-24 – 2020-05-09 (×30): 1 mg via ORAL
  Filled 2020-04-24 (×32): qty 1

## 2020-04-24 MED ORDER — THIAMINE HCL 100 MG/ML IJ SOLN
500.0000 mg | Freq: Once | INTRAVENOUS | Status: AC
  Administered 2020-04-24: 500 mg via INTRAVENOUS
  Filled 2020-04-24: qty 5

## 2020-04-24 MED ORDER — ONDANSETRON HCL 4 MG PO TABS: 4.0000 mg | ORAL_TABLET | Freq: Four times a day (QID) | ORAL | Status: DC | PRN

## 2020-04-24 MED ORDER — ACETAMINOPHEN 500 MG PO TABS
500.0000 mg | ORAL_TABLET | Freq: Once | ORAL | Status: AC
  Administered 2020-04-24: 500 mg via ORAL
  Filled 2020-04-24: qty 1

## 2020-04-24 MED ORDER — NICOTINE 21 MG/24HR TD PT24
21.0000 mg | MEDICATED_PATCH | Freq: Every day | TRANSDERMAL | Status: DC
  Administered 2020-04-24 – 2020-05-08 (×14): 21 mg via TRANSDERMAL
  Filled 2020-04-24 (×15): qty 1

## 2020-04-24 MED ORDER — SODIUM CHLORIDE 0.9 % IV BOLUS
500.0000 mL | Freq: Once | INTRAVENOUS | Status: AC
  Administered 2020-04-24: 500 mL via INTRAVENOUS

## 2020-04-24 MED ORDER — SODIUM CHLORIDE 0.9 % IV SOLN: INTRAVENOUS | Status: AC

## 2020-04-24 MED ORDER — ENSURE ENLIVE PO LIQD
237.0000 mL | Freq: Two times a day (BID) | ORAL | Status: DC
  Administered 2020-04-24 – 2020-05-09 (×25): 237 mL via ORAL
  Filled 2020-04-24 (×2): qty 237

## 2020-04-24 MED ORDER — ADULT MULTIVITAMIN W/MINERALS CH
1.0000 | ORAL_TABLET | Freq: Every day | ORAL | Status: DC
  Administered 2020-04-24 – 2020-05-09 (×16): 1 via ORAL
  Filled 2020-04-24 (×17): qty 1

## 2020-04-24 MED ORDER — ONDANSETRON HCL 4 MG/2ML IJ SOLN: 4.0000 mg | Freq: Four times a day (QID) | INTRAMUSCULAR | Status: DC | PRN

## 2020-04-24 MED ORDER — ENOXAPARIN SODIUM 40 MG/0.4ML ~~LOC~~ SOLN
40.0000 mg | SUBCUTANEOUS | Status: DC
  Administered 2020-04-24 – 2020-04-29 (×6): 40 mg via SUBCUTANEOUS
  Filled 2020-04-24 (×6): qty 0.4

## 2020-04-24 MED ORDER — TAMSULOSIN HCL 0.4 MG PO CAPS
0.8000 mg | ORAL_CAPSULE | Freq: Every day | ORAL | Status: DC
  Administered 2020-04-24 – 2020-05-09 (×16): 0.8 mg via ORAL
  Filled 2020-04-24 (×17): qty 2

## 2020-04-24 MED ORDER — SODIUM CHLORIDE 0.9% FLUSH
3.0000 mL | Freq: Two times a day (BID) | INTRAVENOUS | Status: DC
  Administered 2020-04-24 – 2020-05-08 (×25): 3 mL via INTRAVENOUS

## 2020-04-24 MED ORDER — THIAMINE HCL 100 MG/ML IJ SOLN
100.0000 mg | Freq: Every day | INTRAMUSCULAR | Status: DC
  Administered 2020-04-24 – 2020-04-25 (×2): 100 mg via INTRAVENOUS
  Filled 2020-04-24 (×2): qty 2

## 2020-04-24 NOTE — ED Provider Notes (Signed)
[facility] Emergency Department Provider Note MRN:  [id number]  Arrival date & time: 04/24/20     Chief Complaint   Knee Pain   History of Present Illness   [name] is a 69 y.o. year-old male with a history of chronic pain, schizophrenia presenting to the ED with chief complaint of knee pain.  Left knee pain present for years, worse over the past few weeks.  Denies any other complaints, no fever, no recent trauma.  Pain mild.  Review of Systems  A problem-focused ROS was performed. Positive for knee pain.  Patient denies fever.  Patient's Health History    Past Medical History:  Diagnosis Date  . BPH (benign prostatic hyperplasia)   . Colon polyp 2010  . Depression   . GERD (gastroesophageal reflux disease)   . Hepatitis C    "caught it when I had a blood transfusion"  . High cholesterol   . History of blood transfusion    "when I was young"  . Hypertension   . Paranoid schizophrenia ([place])   . Prostate atrophy   . Retinal vein occlusion     Past Surgical History:  Procedure Laterality Date  . CIRCUMCISION  1959  . TONSILLECTOMY      Family History  Problem Relation Age of Onset  . Hypertension Mother   . Diabetes Mother   . Stroke Mother     Social History   Socioeconomic History  . Marital status: Married    Spouse name: Not on file  . Number of children: Not on file  . Years of education: Not on file  . Highest education level: Not on file  Occupational History  . Not on file  Tobacco Use  . Smoking status: Current Every Day Smoker    Packs/day: 0.50    Years: 48.00    Pack years: 24.00    Types: Cigarettes  . Smokeless tobacco: Never Used  Substance and Sexual Activity  . Alcohol use: Yes  . Drug use: No  . Sexual activity: Yes  Other Topics Concern  . Not on file  Social History Narrative  . Not on file   Social Determinants of Health   Financial Resource Strain:   . Difficulty of Paying Living Expenses:  Not on file  Food Insecurity:   . Worried About Charity fundraiser in the Last Year: Not on file  . Ran Out of Food in the Last Year: Not on file  Transportation Needs:   . Lack of Transportation (Medical): Not on file  . Lack of Transportation (Non-Medical): Not on file  Physical Activity:   . Days of Exercise per Week: Not on file  . Minutes of Exercise per Session: Not on file  Stress:   . Feeling of Stress : Not on file  Social Connections:   . Frequency of Communication with Friends and Family: Not on file  . Frequency of Social Gatherings with Friends and Family: Not on file  . Attends Religious Services: Not on file  . Active Member of Clubs or Organizations: Not on file  . Attends Archivist Meetings: Not on file  . Marital Status: Not on file  Intimate Partner Violence:   . Fear of Current or Ex-Partner: Not on file  . Emotionally Abused: Not on file  . Physically Abused: Not on file  . Sexually Abused: Not on file     Physical Exam   Vitals:   04/03/20 4562 04/03/20 5638  BP: (!) 133/96 126/88  Pulse: (!) 58 73  Resp: 16 15  Temp: 98.3 F (36.8 C) 99 F (37.2 C)  SpO2: 100% 100%    CONSTITUTIONAL:  Chronically ill-appearing, NAD NEURO:  Alert and oriented x 3, no focal deficits EYES:  eyes equal and reactive ENT/NECK:  no LAD, no JVD CARDIO:  regular rate, well-perfused, normal S1 and S2 PULM:  CTAB no wheezing or rhonchi GI/GU:  normal bowel sounds, non-distended, non-tender MSK/SPINE:  No gross deformities, no edema, normal range of motion of both knees SKIN:  no rash, atraumatic PSYCH:  Appropriate speech and behavior  *Additional and/or pertinent findings included in MDM below  Diagnostic and Interventional Summary    EKG Interpretation  Date/Time:    Ventricular Rate:    PR Interval:    QRS Duration:   QT Interval:    QTC Calculation:   R Axis:     Text Interpretation:        Labs Reviewed - No data to display  No orders to  display    Medications - No data to display   Procedures  /  Critical Care Procedures  ED Course and Medical Decision Making  I have reviewed the triage vital signs, the nursing notes, and pertinent available records from the EMR.  Listed above are laboratory and imaging tests that I personally ordered, reviewed, and interpreted and then considered in my medical decision making (see below for details).  No signs of trauma or infection, pain is chronic, frequent ED visitor for same complaint.  Nothing to suggest septic joint, appropriate for discharge with referral.       Barth Kirks. Sedonia Small, [place] [email or web address]  Final Clinical Impressions(s) / ED Diagnoses     ICD-10-CM   1. Chronic knee pain, unspecified laterality  M25.569    G89.29     ED Discharge Orders    None       Discharge Instructions Discussed with and Provided to Patient:     Discharge Instructions     You were evaluated in the Emergency Department and after careful evaluation, we did not find any emergent condition requiring admission or further testing in the hospital.  Your exam/testing today is overall reassuring. Your symptoms seem to be due to arthritis. Please follow-up with an orthopedic specialist.  Please return to the Emergency Department if you experience any worsening of your condition.   Thank you for allowing [place] to be a part of your care.      Maudie Flakes, MD 04/24/20 1626

## 2020-04-24 NOTE — Progress Notes (Signed)
°  Echocardiogram 2D Echocardiogram has been performed.  Colisha Redler G Reigan Tolliver 04/24/2020, 12:30 PM

## 2020-04-24 NOTE — ED Notes (Signed)
RN attempted report , they state they will call RN back
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
## 2020-04-24 NOTE — Progress Notes (Signed)
Lower Ext study completed.  ° °See CVProc for preliminary results.  ° °Azhar Knope, RDMS, RVT ° °
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
## 2020-04-24 NOTE — Progress Notes (Signed)
Initial Nutrition Assessment  DOCUMENTATION CODES:   Non-severe (moderate) malnutrition in context of social or environmental circumstances  INTERVENTION:   -MVI with minerals daily -Ensure Enlive po BID, each supplement provides 350 kcal and 20 grams of protein  NUTRITION DIAGNOSIS:   Moderate Malnutrition related to social / environmental circumstances as evidenced by percent weight loss, mild fat depletion, mild muscle depletion, moderate muscle depletion.  GOAL:   Patient will meet greater than or equal to 90% of their needs  MONITOR:   PO intake, Supplement acceptance, Labs, Weight trends, Skin, I & O's  REASON FOR ASSESSMENT:   Consult Assessment of nutrition requirement/status  ASSESSMENT:   69 y.o. male with medical history significant of chronic pain, schizophrenia, malingering, homelessness, depression, GERD, hepatitis C, HLD, HTNTobacco abuse, chronic diastolic heart failure  Admitted for fever of unknown origin and Acute encephalopathy  Pt admitted with fever and acute metabolic encephalopathy.   Reviewed I/O's: +400 ml x 24 hours  UOP: 100 ml x 24 hours  Pt very lethargic at time of visit. When woken up, he responded in garbled, unintelligible speech. RD unable to obtain further nutrition-related history at this time.   Reviewed wt hx; pt has experienced a 7.6% wt loss over the past month, which is significant for time frame. Of note  Pt is homeless; suspect food insecurity.   Medications reviewed and include thiamine and folic acid.   Labs reviewed.   NUTRITION - FOCUSED PHYSICAL EXAM:    Most Recent Value  Orbital Region Mild depletion  Upper Arm Region No depletion  Thoracic and Lumbar Region No depletion  Buccal Region Mild depletion  Temple Region Mild depletion  Clavicle Bone Region Moderate depletion  Clavicle and Acromion Bone Region Mild depletion  Scapular Bone Region No depletion  Dorsal Hand No depletion  Patellar Region Mild  depletion  Anterior Thigh Region Mild depletion  Posterior Calf Region Mild depletion  Edema (RD Assessment) Mild  Hair Reviewed  Eyes Reviewed  Mouth Reviewed  Skin Reviewed  Nails Reviewed       Diet Order:   Diet Order            Diet regular Room service appropriate? Yes; Fluid consistency: Thin  Diet effective now                 EDUCATION NEEDS:   Not appropriate for education at this time  Skin:  Skin Assessment: Reviewed RN Assessment  Last BM:  Unknown  Height:   Ht Readings from Last 1 Encounters:  04/23/20 5\' 9"  (1.753 m)    Weight:   Wt Readings from Last 1 Encounters:  04/23/20 62 kg    Ideal Body Weight:  72.7 kg  BMI:  Body mass index is 20.18 kg/m.  Estimated Nutritional Needs:   Kcal:  2000-2200  Protein:  110-125 grams  Fluid:  > 2 L    Loistine Chance, RD, LDN, [place] Registered Dietitian II Certified Diabetes Care and Education Specialist Please refer to [facility] for RD and/or RD on-call/weekend/after hours pager
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
## 2020-04-24 NOTE — Discharge Planning (Signed)
TOC following for disposition needs.  [name] [phone]) from peer support is working to get pt housing with [employer].   [name] J. Clydene Laming, [place], [place], [place]
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
## 2020-04-24 NOTE — Plan of Care (Signed)
  Problem: Safety: Goal: Ability to remain free from injury will improve Outcome: Progressing   
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
## 2020-04-25 DIAGNOSIS — F0391 Unspecified dementia with behavioral disturbance: Secondary | ICD-10-CM

## 2020-04-25 DIAGNOSIS — T148XXA Other injury of unspecified body region, initial encounter: Secondary | ICD-10-CM | POA: Diagnosis not present

## 2020-04-25 DIAGNOSIS — R509 Fever, unspecified: Secondary | ICD-10-CM

## 2020-04-25 DIAGNOSIS — N179 Acute kidney failure, unspecified: Secondary | ICD-10-CM | POA: Diagnosis not present

## 2020-04-25 DIAGNOSIS — G92 Toxic encephalopathy: Secondary | ICD-10-CM | POA: Diagnosis not present

## 2020-04-25 DIAGNOSIS — F200 Paranoid schizophrenia: Secondary | ICD-10-CM

## 2020-04-25 DIAGNOSIS — T6709XA Other heatstroke and sunstroke, initial encounter: Secondary | ICD-10-CM | POA: Diagnosis not present

## 2020-04-25 DIAGNOSIS — F0390 Unspecified dementia without behavioral disturbance: Secondary | ICD-10-CM | POA: Diagnosis not present

## 2020-04-25 DIAGNOSIS — I10 Essential (primary) hypertension: Secondary | ICD-10-CM

## 2020-04-25 DIAGNOSIS — E785 Hyperlipidemia, unspecified: Secondary | ICD-10-CM

## 2020-04-25 DIAGNOSIS — K739 Chronic hepatitis, unspecified: Secondary | ICD-10-CM

## 2020-04-25 DIAGNOSIS — F03918 Unspecified dementia, unspecified severity, with other behavioral disturbance: Secondary | ICD-10-CM

## 2020-04-25 DIAGNOSIS — Z20822 Contact with and (suspected) exposure to covid-19: Secondary | ICD-10-CM | POA: Diagnosis not present

## 2020-04-25 DIAGNOSIS — G9340 Encephalopathy, unspecified: Secondary | ICD-10-CM | POA: Diagnosis not present

## 2020-04-25 DIAGNOSIS — E440 Moderate protein-calorie malnutrition: Secondary | ICD-10-CM

## 2020-04-25 DIAGNOSIS — Z720 Tobacco use: Secondary | ICD-10-CM

## 2020-04-25 DIAGNOSIS — K219 Gastro-esophageal reflux disease without esophagitis: Secondary | ICD-10-CM

## 2020-04-25 LAB — HCV RNA QUANT
HCV Quantitative: NOT DETECTED IU/mL (ref >50)
HCV Quantitative: NOT DETECTED IU/mL (ref >50)

## 2020-04-25 LAB — CK: Total CK: 766 U/L — ABNORMAL HIGH (ref 49–397)

## 2020-04-25 LAB — HEPATITIS B SURFACE ANTIBODY, QUANTITATIVE: Hep B S AB Quant (Post): 864.5 m[IU]/mL (ref >9.9)

## 2020-04-25 MED ORDER — MAGNESIUM SULFATE 2 GM/50ML IV SOLN
2.0000 g | Freq: Once | INTRAVENOUS | Status: AC
  Administered 2020-04-25: 2 g via INTRAVENOUS
  Filled 2020-04-25: qty 50

## 2020-04-25 MED ORDER — FLUPHENAZINE DECANOATE 25 MG/ML IJ SOLN
25.0000 mg | INTRAMUSCULAR | Status: DC
  Administered 2020-04-25: 25 mg via INTRAMUSCULAR
  Filled 2020-04-25 (×2): qty 1

## 2020-04-25 MED ORDER — THIAMINE HCL 100 MG PO TABS
100.0000 mg | ORAL_TABLET | Freq: Every day | ORAL | Status: DC
  Administered 2020-04-26 – 2020-05-09 (×14): 100 mg via ORAL
  Filled 2020-04-25 (×14): qty 1
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
## 2020-04-25 NOTE — Plan of Care (Signed)
  Problem: Education: Goal: Knowledge of General Education information will improve Description Including pain rating scale, medication(s)/side effects and non-pharmacologic comfort measures Outcome: Progressing   

## 2020-04-25 NOTE — Progress Notes (Signed)
Spoke with Wyvonnia Lora RN regarding consult placed. At this time patient has no IV infusions ordered. Instructed Nichola RN to place/MD to place MD order for PIV SL order. VU. Fran Lowes, RN VAST

## 2020-04-25 NOTE — Progress Notes (Signed)
Patients wallet placed in personal belonging bag.  Brought to room by Manya Silvas RN.
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
## 2020-04-25 NOTE — Progress Notes (Signed)
CT scan images reviewed.  Patient discussed with [name].  Plan for Right foot/ankle ORIF on 04/30/20. Maintain NWB status to RLE until procedure.  Elevate leg to improve swelling prior to procedure.
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
## 2020-04-26 DIAGNOSIS — T6709XA Other heatstroke and sunstroke, initial encounter: Secondary | ICD-10-CM | POA: Diagnosis not present

## 2020-04-26 DIAGNOSIS — Z8619 Personal history of other infectious and parasitic diseases: Secondary | ICD-10-CM

## 2020-04-26 DIAGNOSIS — G9340 Encephalopathy, unspecified: Secondary | ICD-10-CM | POA: Diagnosis not present

## 2020-04-26 DIAGNOSIS — T148XXA Other injury of unspecified body region, initial encounter: Secondary | ICD-10-CM | POA: Diagnosis not present

## 2020-04-26 LAB — COMPREHENSIVE METABOLIC PANEL
ALT: 16 U/L (ref 0–44)
AST: 29 U/L (ref 15–41)
Albumin: 3.4 g/dL — ABNORMAL LOW (ref 3.5–5.0)
Alkaline Phosphatase: 27 U/L — ABNORMAL LOW (ref 38–126)
Anion gap: 9 (ref 5–15)
BUN: 16 mg/dL (ref 8–23)
CO2: 28 mmol/L (ref 22–32)
Calcium: 8.5 mg/dL — ABNORMAL LOW (ref 8.9–10.3)
Chloride: 103 mmol/L (ref 98–111)
Creatinine, Ser: 1.42 mg/dL — ABNORMAL HIGH (ref 0.61–1.24)
GFR calc Af Amer: 58 mL/min — ABNORMAL LOW (ref >60)
GFR calc non Af Amer: 50 mL/min — ABNORMAL LOW (ref >60)
Glucose, Bld: 120 mg/dL — ABNORMAL HIGH (ref 70–99)
Potassium: 3.4 mmol/L — ABNORMAL LOW (ref 3.5–5.1)
Sodium: 140 mmol/L (ref 135–145)
Total Bilirubin: 0.8 mg/dL (ref 0.3–1.2)
Total Protein: 6.1 g/dL — ABNORMAL LOW (ref 6.5–8.1)

## 2020-04-26 MED ORDER — ACETAMINOPHEN 325 MG PO TABS: 650.0000 mg | ORAL_TABLET | Freq: Four times a day (QID) | ORAL | Status: DC | PRN

## 2020-04-26 NOTE — Progress Notes (Signed)
PHARMACY - PHYSICIAN COMMUNICATION CRITICAL VALUE ALERT - BLOOD CULTURE IDENTIFICATION (BCID)  [name] is an 69 y.o. male who presented to [facility] on 04/23/2020 with a chief complaint of AMS.   Assessment:   1/2 Blood cultures growing Gram positive rods.  No leukocytosis, PCT negative.  Likely contaminant   Name of physician (or Provider) Contacted:  Dr. Oleh Genin  Current antibiotics: None  Changes to prescribed antibiotics recommended:  None at this time  No results found for this or any previous visit.  [name] 04/26/2020  7:33 AM

## 2020-04-26 NOTE — Progress Notes (Signed)
Patient awaiting surgery for left foot, otherwise now medically stable, awaiting SNF placement. Unlikely to get SNF placement today, most likely Monday or Tuesday. Earliest surgery can be scheduled for ORIF is Tuesday, 9/14. Discussed with orthopedics, PA Jeffery, who recommended either stay for surgery or remain NWB and go to SNF with outpatient follow up. As SNF will likely not be ready for several more days, will continue to wait. Attempted to contact next of kin, wife, left VM.  Gladys Damme, MD [place] [facility], PGY-2

## 2020-04-26 NOTE — Progress Notes (Signed)
Family Medicine Teaching Service Daily Progress Note Intern Pager: [phone]  Patient name: [name] Medical record number: [id number] Date of birth: Apr 06, 1951 Age: 69 y.o. Gender: male  Primary Care Provider: Danna Hefty, DO Consultants: Ortho Code Status: Full  Pt Overview and Major Events to Date:  Admitted 9/8  Assessment and Plan: [name] is a 69 yo man with PMH of chronic pain, schizophrenia, malingering, homelessness, depression, GERD, hepatitis C, HLD, HTN, tobacco abuse, CKD, and HFpEF   Acute toxic metabolic encephalopathy   Sepsis, improved Blood cultures growing Gram positive rods, no leukocytosis, PCT negative and likely contaminate. Hep C, HIV pending. Afebrile overnight.  - Pending Hep C viral load and HIV - Dietician consulted - TOC consulted for disposition - Continue following blood culture  Elevated CK   Right leg injury Leg is erythematous and warm to touch, awaiting blood cultures. CK trend: 762>831>517. PT/OT recommendation of SNF. CT shows numerous fractures, ortho planning for right foot/ankle ORIF on 9/14. - Ortho consulted, appreciate recommendations. - NS @ 153mL/hr - repeat CK, LDH at 1400  Bradycardia Patient has intermittently had low HR with the lowest being 48 in the last 24 hours; usually in the 50s- 60s.  - Disontinue cardiac telemetry - Obtain ekg if bradycardia <50 bpm, getting one this morning  CKD stage III, improved Cr baseline is 1.4-1.6, initially elevated at admission, currently at baseline - avoid nephrotoxic agents - continue IV hydration  Thickened aortic cusp  Echo: LV 60-65%, aortic valve thickened non coronary cusp, consider TEE is suspicious for for SBE high to r/o vegetation. Blood cultures growing Gram positive rods, no leukocytosis, PCT negative and likely contaminate - Continue following blood cultures  Schizophrenia Patient has a diagnosis of schizophrenia and will need to follow-up with outpatient  psychiatry. Unsure if patient was on  Fluphenazine deconate (prolixin) outpatient and benztropine. Patient receives prolixin q2w with last dose on 8/23, patient received his dose on 9/9. - Continue benztropine  Anemia Baseline hg varies from 11-12. Hgb on admission stable at 11.4. Recommend patient follow up outpatient for colonoscopy. - Daily CBC  HFpEF Combined diastolic and systolic with preserved EF. Last echo in 2018 showed G1DD, moderate LVH, and EF 55-60%. Current EF 60-65%.  HTN Chronic, stable. Had several elevated pressures over the last 24 hours with ranges from 116-166/77-91. Home medications include Norvasc, Cozaar.  - Continue home medications  HLD Chronic, stable. Home medication is lipitor. Will hold statin in the setting of elevated CK. Last lipid panel 04/2019 shows good control: total cholesterol 100, triglycerides 22, HDL 54, LDL 37. - Obtain updated lipid panel  BPH Chronic and stable. On Flomax. - Continue home meds  FEN/GI: Regular diet PPx: Lovenox for DVT PPX   Status is: Inpatient  Remains inpatient appropriate because:Ongoing diagnostic testing needed not appropriate for outpatient work up   Dispo:  Patient From: Other (unstable housing)  Planned Disposition: To be determined  Expected discharge date: 04/29/20  Medically stable for discharge: No   Subjective:  RN states that the patient did well overnight and tried to get out of bed one time. Patient was talking about his best friend of 32 years while in the room and that the all of the hospital staff has been great. States he feels pretty good right now.   Objective: Temp:  [98 F (36.7 C)-99.5 F (37.5 C)] 98 F (36.7 C) (09/10 0800) Pulse Rate:  [56-76] 76 (09/10 0800) Resp:  [16-18] 18 (09/10 0800) BP: (  116-166)/(77-91) 148/82 (09/10 0800) SpO2:  [97 %-99 %] 98 % (09/10 0800) Physical Exam: General: older, thin male sitting up in bed, NAD in good spirits Cardiovascular: S1 and S2  present, RRR Respiratory: CTAB, no increased WOB Abdomen: non-tender, non-distended Extremities: RLE edematous with mild warmth unchanged from prior exam, right foot tender to palpation grossly. RLE warm and dry. Psych: speech is difficult to understand, patient responds with echolalia and some responses to questions, in good spirits today  Laboratory: Recent Labs  Lab 04/23/20 1713 04/24/20 0026 04/24/20 0541  WBC 7.3  --  6.4  HGB 12.1* 13.3 11.4*  HCT 36.6* 39.0 35.9*  PLT 97*  --  83*   Recent Labs  Lab 04/23/20 1713 04/23/20 1713 04/24/20 0026 04/24/20 1107 04/26/20 0851  NA 143   < > 143 142 140  K 4.0   < > 3.6 3.8 3.4*  CL 105  --   --  109 103  CO2 28  --   --  19* 28  BUN 20  --   --  14 16  CREATININE 1.77*  --   --  1.33* 1.42*  CALCIUM 9.4  --   --  8.4* 8.5*  PROT 7.2  --   --   --  6.1*  BILITOT 1.5*  --   --   --  0.8  ALKPHOS 37*  --   --   --  27*  ALT 17  --   --   --  16  AST 31  --   --   --  29  GLUCOSE 113*  --   --  74 120*   < > = values in this interval not displayed.      Imaging/Diagnostic Tests: No results found.   Corey Patience, DO 04/26/2020, 12:00 PM PGY-1, [place] Intern pager: [phone], text pages welcome

## 2020-04-27 DIAGNOSIS — T148XXA Other injury of unspecified body region, initial encounter: Secondary | ICD-10-CM | POA: Diagnosis not present

## 2020-04-27 DIAGNOSIS — G9340 Encephalopathy, unspecified: Secondary | ICD-10-CM | POA: Diagnosis not present

## 2020-04-27 DIAGNOSIS — T6709XA Other heatstroke and sunstroke, initial encounter: Secondary | ICD-10-CM | POA: Diagnosis not present

## 2020-04-27 LAB — BASIC METABOLIC PANEL
Anion gap: 9 (ref 5–15)
BUN: 16 mg/dL (ref 8–23)
CO2: 27 mmol/L (ref 22–32)
Calcium: 8.2 mg/dL — ABNORMAL LOW (ref 8.9–10.3)
Chloride: 101 mmol/L (ref 98–111)
Creatinine, Ser: 1.23 mg/dL (ref 0.61–1.24)
GFR calc Af Amer: >60 mL/min (ref >60)
GFR calc non Af Amer: 60 mL/min — ABNORMAL LOW (ref >60)
Glucose, Bld: 95 mg/dL (ref 70–99)
Potassium: 3.7 mmol/L (ref 3.5–5.1)
Sodium: 137 mmol/L (ref 135–145)

## 2020-04-27 MED ORDER — AMLODIPINE BESYLATE 10 MG PO TABS
10.0000 mg | ORAL_TABLET | Freq: Every day | ORAL | Status: DC
  Administered 2020-04-27 – 2020-05-09 (×13): 10 mg via ORAL
  Filled 2020-04-27 (×11): qty 1
  Filled 2020-04-27: qty 2
  Filled 2020-04-27: qty 1

## 2020-04-27 NOTE — Progress Notes (Addendum)
Family Medicine Teaching Service Daily Progress Note Intern Pager: [phone]  Patient name: [name] Medical record number: [id number] Date of birth: 1950/08/26 Age: 69 y.o. Gender: male  Primary Care Provider: Danna Hefty, DO Consultants: Orthopedic surgery  Code Status: Full  Pt Overview and Major Events to Date:  Admitted 9/8  Assessment and Plan: [name] is a 69 yo man with PMH of chronic pain, schizophrenia, malingering, homelessness, depression, GERD, hepatitis C, HLD, HTN, tobacco abuse, CKD, and HFpEF.  Crush Injury  Multiple fractures of right foot  Improving swelling and tenderness.  - no pain control needed at this time  - PT/OT recs for SNF  - ortho consulted, ORIF scheduled for 9/14  CKD stage IIIa, stable  Awaiting BMP today.  - AM BMP  - encourage PO hydration   Schizophrenia Patient at mental baseline this morning. Cooperative and friendly on exam. A & O x 2. On vistaril at home 50 mg QHS, proloxin 25 mg inject q 14 days (last dose 9/9), benztropine 1 mg BID.  - Continue benztropine - Prolixin q 2 weeks  - holding vistaril, can restart if needed  - needs o/p psych f/u at discharge   Bacteremia Blood cultures growing Gram positive rods in 1/2 bottles. Likely contaminate.  - continue to follow  - see problem: aortic cusp below.   HTN, chronic  HFpEF, 60-65% EF  Last BP 152/79. Has not had any BP meds on since admit. Ranges 148-152/66-82. Home meds amlodipine 10 mg (no pharmacy records of being filled in last 6 months per med rec), losartan 100 mg (last filled 03/02/20). Unclear if patient was taking outpatient. Will not restart for HTN at this time given upcoming surgery.  - Restart amlodipine  - holding losartan  Normocytic Anemia, chronic, stable  Baseline Hgb 11-12. Stable at 11.4 on 9/8.  - repeat CBC 9/13 in anticipation of surgery  - recommend o/p follow up   HLD - holding home atorvastatin 40 mg.   BPH Chronic and stable. On  Flomax. - Continue home meds  Thickened aortic cusp  Echo: LV 60-65%, aortic valve thickened non coronary cusp, consider TEE is suspicious for SBE high to r/o vegetation. Blood cultures growing Gram positive rods, no leukocytosis, PCT negative and likely contaminate - Continue following blood cultures. Will readdress when blood cultures are final.   Acute toxic metabolic encephalopathy, resolved Sepsis, resolved   FEN/GI: Regular diet PPx: Lovenox for DVT PPX Status is: Inpatient Remains inpatient appropriate because:Ongoing diagnostic testing needed not appropriate for outpatient work up  Dispo:  Patient From: Other (unstable housing)  Planned Disposition: To be determined  Expected discharge date: 04/29/20  Medically stable for discharge: No  Subjective:  NAEO. Patient has no complaints this morning    Objective: Temp:  [98 F (36.7 C)-98.3 F (36.8 C)] 98.3 F (36.8 C) (09/11 0331) Pulse Rate:  [50-76] 50 (09/11 0331) Resp:  [14-18] 14 (09/11 0331) BP: (148-152)/(66-82) 152/79 (09/11 0331) SpO2:  [97 %-100 %] 100 % (09/11 0331) Weight:  [67 kg] 67 kg (09/10 2014)  Physical Exam: General: lying in bed sleeping. Easily arousable.  Chest: RRR. No m/r/g Lungs: CTAB. No  MSK: right foot swelling and tenderness, improved. No BLEE. Patient  Neuro: A & O  To self, place. States that it is 2001, and corrected to 2021.   Laboratory: Recent Labs  Lab 04/23/20 1713 04/24/20 0026 04/24/20 0541  WBC 7.3  --  6.4  HGB 12.1* 13.3 11.4*  HCT 36.6* 39.0 35.9*  PLT 97*  --  83*   Recent Labs  Lab 04/23/20 1713 04/23/20 1713 04/24/20 0026 04/24/20 1107 04/26/20 0851  NA 143   < > 143 142 140  K 4.0   < > 3.6 3.8 3.4*  CL 105  --   --  109 103  CO2 28  --   --  19* 28  BUN 20  --   --  14 16  CREATININE 1.77*  --   --  1.33* 1.42*  CALCIUM 9.4  --   --  8.4* 8.5*  PROT 7.2  --   --   --  6.1*  BILITOT 1.5*  --   --   --  0.8  ALKPHOS 37*  --   --   --  27*  ALT 17   --   --   --  16  AST 31  --   --   --  29  GLUCOSE 113*  --   --  74 120*   < > = values in this interval not displayed.    Imaging/Diagnostic Tests: No results found.   Wilber Oliphant, MD 04/27/2020, 6:54 AM PGY-3, [place] Intern pager: [phone], text pages welcome

## 2020-04-27 NOTE — TOC Progression Note (Addendum)
Transition of Care [facility]) - Progression Note    Patient Details  Name: [name] MRN: [id number] Date of Birth: January 18, 1951  Transition of Care [facility] CM/SW Contact  Jenise Iannelli, [place], [place] Phone Number: 04/27/2020, 3:33 PM  Clinical Narrative:    Met with patient at bedside to discuss discharge planning. Patient oriented X2. Patient's verbal skills limited, incoherent at times.  Phone call to Aaron Edelman from Peer Support who reports that this is patient's baseline. He continues to confirm plan for patient to discharge to [facility]F at discharge. Per Aaron Edelman paperwork has been submitted, he will follow up tomorrow regarding patient's transition to [facility].  Akon Reinoso [address] LCSW Transition of Care [phone]    Expected Discharge Plan: Assisted Living Barriers to Discharge: Continued Medical Work up  Expected Discharge Plan and Services Expected Discharge Plan: Assisted Living                                               Social Determinants of Health (SDOH) Interventions    Readmission Risk Interventions No flowsheet data found.

## 2020-04-27 NOTE — Progress Notes (Signed)
Family Medicine Teaching Service Daily Progress Note Intern Pager: [phone]  Patient name: [name] Medical record number: [id number] Date of birth: 17-Apr-1951 Age: 69 y.o. Gender: male  Primary Care Provider: Danna Hefty, DO Consultants: Orthopedic surgery  Code Status: Full  Pt Overview and Major Events to Date:  Admitted 9/8  Assessment and Plan: [name] is a 69 yo man with PMH of chronic pain, schizophrenia, malingering, homelessness, depression, GERD, hepatitis C, HLD, HTN, tobacco abuse, CKD, and HFpEF.  Crush Injury  Multiple fractures of right foot  Denies complaints at this time.  No pain on examination.  2+ DP pulses BLE. - no pain control needed at this time  - PT/OT recs for SNF, plans to go to ALF per ASW notes - ortho consulted, ORIF scheduled for 9/14  CKD stage IIIa, stable  Cr has been stable.  This AM Cr pending. - AM BMP  - encourage PO hydration   Schizophrenia Remains at mental baseline. Cooperative and friendly on exam. A & O x 2. On vistaril at home 50 mg QHS, proloxin 25 mg inject q 14 days (last dose 9/9), benztropine 1 mg BID.  - Continue benztropine - Prolixin q 2 weeks  - holding vistaril, can restart if needed  - needs o/p psych f/u at discharge   Bacteremia Blood cultures growing Gram positive rods in 1/2 bottles. Likely contaminate.  - continue to follow  - see problem: aortic cusp below.   HTN, chronic  HFpEF, 60-65% EF  Last BP check 2000, was 154/77. Home norvasc had been restarted on 9/11, will continue to monitor BP.  Home meds amlodipine 10 mg (no pharmacy records of being filled in last 6 months per med rec), losartan 100 mg (last filled 03/02/20).  - cont amlodipine  - cont to monitor BP - holding losartan given upcoming surgery  Normocytic Anemia, chronic, stable  Baseline Hgb 11-12. Stable at 11.4 on 9/8.  - repeat CBC 9/13 in anticipation of surgery  - recommend o/p follow up   HLD - holding home  atorvastatin 40 mg.   BPH Chronic and stable. On Flomax. - Continue home meds  Thickened aortic cusp  Echo: LV 60-65%, aortic valve thickened non coronary cusp, consider TEE is suspicious for SBE high to r/o vegetation. Blood cultures growing Gram positive rods in anaerobic bottle only, no leukocytosis, aerobic bottle now NG3D. - Continue following blood cultures. Will readdress when blood cultures are final.   Acute toxic metabolic encephalopathy, resolved Sepsis, resolved   FEN/GI: Regular diet PPx: Lovenox for DVT PPX Status is: Inpatient Remains inpatient appropriate because:Ongoing diagnostic testing needed not appropriate for outpatient work up  Dispo:  Patient From: Other (unstable housing)  Planned Disposition: To be determined  Expected discharge date: 04/29/20  Medically stable for discharge: No  Subjective:  Patient denies complaints this AM.  States that he does not have pain.   Objective: Temp:  [98.1 F (36.7 C)-99.1 F (37.3 C)] 99.1 F (37.3 C) (09/12 0548) Pulse Rate:  [56-68] 68 (09/12 0548) Resp:  [16-18] 16 (09/12 0548) BP: (154-163)/(77-97) 163/85 (09/12 0548) SpO2:  [99 %-100 %] 99 % (09/12 0548) Weight:  [64.5 kg] 64.5 kg (09/11 2032)  Physical Exam:  General: 69 y.o. male in NAD, pleasant Cardio: RRR Lungs: CTAB, no wheezing, no rhonchi, no crackles, no IWOB on RA Skin: warm and dry Extremities: No edema, 2+ DP pulses BLE   Laboratory: Recent Labs  Lab 04/23/20 1713 04/24/20 0026 04/24/20 0541  WBC 7.3  --  6.4  HGB 12.1* 13.3 11.4*  HCT 36.6* 39.0 35.9*  PLT 97*  --  83*   Recent Labs  Lab 04/23/20 1713 04/24/20 0026 04/24/20 1107 04/26/20 0851 04/27/20 0903  NA 143   < > 142 140 137  K 4.0   < > 3.8 3.4* 3.7  CL 105  --  109 103 101  CO2 28  --  19* 28 27  BUN 20  --  14 16 16   CREATININE 1.77*  --  1.33* 1.42* 1.23  CALCIUM 9.4  --  8.4* 8.5* 8.2*  PROT 7.2  --   --  6.1*  --   BILITOT 1.5*  --   --  0.8  --    ALKPHOS 37*  --   --  27*  --   ALT 17  --   --  16  --   AST 31  --   --  29  --   GLUCOSE 113*  --  74 120* 95   < > = values in this interval not displayed.    Imaging/Diagnostic Tests: No results found.   [place], DO 04/28/2020, 5:49 AM PGY-3, [place] Intern pager: [phone], text pages welcome

## 2020-04-27 NOTE — Progress Notes (Signed)
Patient has pulled out IV and MD has been notified, RN has educated the patient and will continue to monitor this patient. There is not active bleeding from this patient at this time

## 2020-04-28 DIAGNOSIS — T148XXA Other injury of unspecified body region, initial encounter: Secondary | ICD-10-CM | POA: Diagnosis not present

## 2020-04-28 DIAGNOSIS — G9340 Encephalopathy, unspecified: Secondary | ICD-10-CM | POA: Diagnosis not present

## 2020-04-28 DIAGNOSIS — T6709XA Other heatstroke and sunstroke, initial encounter: Secondary | ICD-10-CM | POA: Diagnosis not present

## 2020-04-28 LAB — CULTURE, BLOOD (ROUTINE X 2): Special Requests: ADEQUATE

## 2020-04-28 LAB — CBC
HCT: 33.4 % — ABNORMAL LOW (ref 39.0–52.0)
Hemoglobin: 10.8 g/dL — ABNORMAL LOW (ref 13.0–17.0)
MCH: 29 pg (ref 26.0–34.0)
MCHC: 32.3 g/dL (ref 30.0–36.0)
MCV: 89.8 fL (ref 80.0–100.0)
Platelets: 103 10*3/uL — ABNORMAL LOW (ref 150–400)
RBC: 3.72 MIL/uL — ABNORMAL LOW (ref 4.22–5.81)
RDW: 13.7 % (ref 11.5–15.5)
WBC: 4.3 10*3/uL (ref 4.0–10.5)
nRBC: 0 % (ref 0.0–0.2)

## 2020-04-28 LAB — BASIC METABOLIC PANEL
Anion gap: 9 (ref 5–15)
BUN: 17 mg/dL (ref 8–23)
CO2: 27 mmol/L (ref 22–32)
Calcium: 8.9 mg/dL (ref 8.9–10.3)
Chloride: 104 mmol/L (ref 98–111)
Creatinine, Ser: 1.23 mg/dL (ref 0.61–1.24)
GFR calc Af Amer: >60 mL/min (ref >60)
GFR calc non Af Amer: 60 mL/min — ABNORMAL LOW (ref >60)
Glucose, Bld: 90 mg/dL (ref 70–99)
Potassium: 3.8 mmol/L (ref 3.5–5.1)
Sodium: 140 mmol/L (ref 135–145)
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
## 2020-04-29 ENCOUNTER — Other Ambulatory Visit: Payer: Self-pay | Admitting: Physician Assistant

## 2020-04-29 DIAGNOSIS — T6709XA Other heatstroke and sunstroke, initial encounter: Secondary | ICD-10-CM | POA: Diagnosis not present

## 2020-04-29 DIAGNOSIS — G9340 Encephalopathy, unspecified: Secondary | ICD-10-CM | POA: Diagnosis not present

## 2020-04-29 DIAGNOSIS — T148XXA Other injury of unspecified body region, initial encounter: Secondary | ICD-10-CM | POA: Diagnosis not present

## 2020-04-29 LAB — CBC
HCT: 30.9 % — ABNORMAL LOW (ref 39.0–52.0)
Hemoglobin: 10.2 g/dL — ABNORMAL LOW (ref 13.0–17.0)
MCH: 29.6 pg (ref 26.0–34.0)
MCHC: 33 g/dL (ref 30.0–36.0)
MCV: 89.6 fL (ref 80.0–100.0)
Platelets: 124 10*3/uL — ABNORMAL LOW (ref 150–400)
RBC: 3.45 MIL/uL — ABNORMAL LOW (ref 4.22–5.81)
RDW: 13.7 % (ref 11.5–15.5)
WBC: 5.5 10*3/uL (ref 4.0–10.5)
nRBC: 0 % (ref 0.0–0.2)

## 2020-04-29 LAB — BASIC METABOLIC PANEL
Anion gap: 9 (ref 5–15)
BUN: 26 mg/dL — ABNORMAL HIGH (ref 8–23)
CO2: 27 mmol/L (ref 22–32)
Calcium: 8.7 mg/dL — ABNORMAL LOW (ref 8.9–10.3)
Chloride: 102 mmol/L (ref 98–111)
Creatinine, Ser: 1.54 mg/dL — ABNORMAL HIGH (ref 0.61–1.24)
GFR calc Af Amer: 53 mL/min — ABNORMAL LOW (ref >60)
GFR calc non Af Amer: 46 mL/min — ABNORMAL LOW (ref >60)
Glucose, Bld: 98 mg/dL (ref 70–99)
Potassium: 3.8 mmol/L (ref 3.5–5.1)
Sodium: 138 mmol/L (ref 135–145)

## 2020-04-29 MED ORDER — SODIUM CHLORIDE 0.9 % IV SOLN: INTRAVENOUS | Status: DC

## 2020-04-29 NOTE — Progress Notes (Addendum)
Family Medicine Teaching Service Daily Progress Note Intern Pager: [phone]  Patient name: [name] Medical record number: [id number] Date of birth: [birth year] Age: 69 y.o. Gender: male  Primary Care Provider: Danna Hefty, DO Consultants: Ortho Code Status: Full  Pt Overview and Major Events to Date:  Admitted 9/8  Assessment and Plan:  [name] is a 69 yo man with PMH of chronic pain, schizophrenia, malingering, homelessness, depression, GERD, hepatitis C, HLD, HTN, tobacco abuse, CKD, and HFpEF.   Crush Injury  Multiple fractures of right foot  Denies complaints at this time.  No pain on examination.  2+ DP pulses BLE. ORIF tomorrow 9/14. -No pain control needed at this time  -PT/OT recs for SNF, plans to go to ALF per ASW notes -ORIF scheduled for 9/14   CKD stage IIIa, stable  Cr has been stable. Today is 1.54. -AM BMP  -Encourage PO hydration    Schizophrenia Remains at mental baseline. Cooperative and friendly on exam. On vistaril at home 50 mg QHS, proloxin 25 mg inject q 14 days (last dose 9/9), benztropine 1 mg BID.  -Continue benztropine -Prolixin q 2 weeks  -Holding vistaril, can restart if needed  -Needs outpatient Psych follow up at discharge    Bacteremia Blood cultures growing Gram positive rods in 1/2 bottles. Likely contaminate.  -Continue to monitor  -see problem: aortic cusp below.    HTN, chronic  HFpEF, 60-65% EF  BP was 133/77. Will continue to monitor BP. Holding losartan 100 mg (last filled 03/02/20).  -Continue Amlodipine 10 mg daily -Continue to monitor BP -Holding losartan given upcoming surgery   Normocytic Anemia, chronic, stable  Baseline Hgb 11-12. 9/8 was 11.4. 9/12 was 10.8. Today Hgb is 10.2.  -Repeat CBC 9/13 in anticipation of surgery  -Follow up outpatient    HLD -Holding home atorvastatin 40 mg.    BPH Chronic and stable. On Flomax. -Continue home meds   Thickened aortic cusp  Echo: LV 60-65%,  aortic valve thickened non coronary cusp, consider TEE is suspicious for SBE high to r/o vegetation. Blood cultures growing Gram positive rods in anaerobic bottle only, no leukocytosis, aerobic bottle n ow NG3D. Only one Culture positive for Corynebacterium Minutiiimum. Has no symptoms of infection. -Most likely contamination, nothing to do at the moment -Continue to monitor for any signs of infection   Acute toxic metabolic encephalopathy, resolved Sepsis, resolved   FEN/GI: Regular PPx: Lovenox  Disposition: Med Tele  Prior to Admission Living Arrangement: Unstable Anticipated Discharge Location: Possibly SNF Barriers to Discharge: ORIF tomorrow Anticipated discharge in approximately 2-5 day(s).   Subjective:  Interviewed patient at bedside.  He reports that he has no pain in his foot today except to moderate palpation. He has no other complaints at this time.   Discussed plan for today is waiting for surgery tomorrow.   Objective: Temp:  [98.4 F (36.9 C)-99.2 F (37.3 C)] 98.8 F (37.1 C) (09/13 0601) Pulse Rate:  [58-78] 68 (09/13 0601) Resp:  [16-20] 16 (09/13 0601) BP: (124-156)/(77-79) 137/77 (09/13 0601) SpO2:  [97 %-100 %] 100 % (09/13 0601) Weight:  [64.7 kg] 64.7 kg (09/12 2022) Physical Exam:  Physical Exam Vitals and nursing note reviewed.  Constitutional:      General: He is not in acute distress.    Appearance: Normal appearance. He is not ill-appearing, toxic-appearing or diaphoretic.  HENT:     Head: Normocephalic and atraumatic.  Cardiovascular:     Rate and Rhythm: Normal rate and  regular rhythm.     Pulses: Normal pulses.          Radial pulses are 2+ on the right side and 2+ on the left side.       Dorsalis pedis pulses are 2+ on the right side and 2+ on the left side.     Heart sounds: Normal heart sounds, S1 normal and S2 normal. No murmur heard.   Pulmonary:     Effort: Pulmonary effort is normal. No respiratory distress.     Breath  sounds: Normal breath sounds. No wheezing.  Abdominal:     General: Bowel sounds are normal. There is no distension.     Palpations: Abdomen is soft.     Tenderness: There is no abdominal tenderness.  Musculoskeletal:     Right lower leg: No edema.     Left lower leg: No edema.  Feet:     Comments: Right foot tender to moderate pressure palpation.     Laboratory: Recent Labs  Lab 04/24/20 0541 04/28/20 1141 04/29/20 0311  WBC 6.4 4.3 5.5  HGB 11.4* 10.8* 10.2*  HCT 35.9* 33.4* 30.9*  PLT 83* 103* 124*   Recent Labs  Lab 04/23/20 1713 04/24/20 0026 04/26/20 0851 04/26/20 0851 04/27/20 0903 04/28/20 0633 04/29/20 0311  NA 143   < > 140   < > 137 140 138  K 4.0   < > 3.4*   < > 3.7 3.8 3.8  CL 105   < > 103   < > 101 104 102  CO2 28   < > 28   < > 27 27 27   BUN 20   < > 16   < > 16 17 26*  CREATININE 1.77*   < > 1.42*   < > 1.23 1.23 1.54*  CALCIUM 9.4   < > 8.5*   < > 8.2* 8.9 8.7*  PROT 7.2  --  6.1*  --   --   --   --   BILITOT 1.5*  --  0.8  --   --   --   --   ALKPHOS 37*  --  27*  --   --   --   --   ALT 17  --  16  --   --   --   --   AST 31  --  29  --   --   --   --   GLUCOSE 113*   < > 120*   < > 95 90 98   < > = values in this interval not displayed.      Imaging/Diagnostic Tests:   DG Foot 2 Views Right: Evaluation limited by patient positioning. Consider repeat radiographs with an oblique view. Acute, minimally displaced fractures involving the second through fourth metatarsals. Acute appearing avulsion fracture through the base of the fifth metatarsal. Acute, nondisplaced fracture through the lateral malleolus. Questionable nondisplaced fracture involving the dorsal anterior talus.   ECHOCARDIOGRAM COMPLETE: EF 60-65%. No wall motion abnormalities. All structures visualized were normal except- Thickened non coronary cusp consider TEE if suspicion for SBE high to r/o vegetation on non coronay cusp of AV. The aortic valve is tricuspid. Aortic valve  regurgitation is mild. Mild to moderate aortic valve sclerosis/calcification is present, without any evidence of aortic stenosis   VAS [place] LOWER EXTREMITY VENOUS (DVT): RIGHT: - There is no evidence of deep vein thrombosis in the lower extremity.  - No cystic structure found in the popliteal fossa.  LEFT: - No evidence of common femoral vein obstruction.   US Abdomen Limited RUQ: Two small simple hepatic cysts are identified. The exam is otherwise negative.   Briant Cedar, MD 04/29/2020, 6:52 AM PGY-1, [place] Intern pager: [phone], text pages welcome

## 2020-04-29 NOTE — Plan of Care (Signed)
  Problem: Safety: Goal: Ability to remain free from injury will improve Outcome: Progressing   

## 2020-04-29 NOTE — Progress Notes (Signed)
Pt had several very small BMs last night and kept setting off the bed alarm, by getting up to the [facility] d/t the frequency. Pt states that he believes it was the milk that he drank earlier. RN moved pt closer to the front for safety reasons. Pt did have a bigger sized BM this am and he says he feels like he may be done. None of the BMs were loose, they were all mushy and the last one was formed.   Eleanora Neighbor, RN

## 2020-04-29 NOTE — Plan of Care (Signed)
  Problem: Activity: Goal: Risk for activity intolerance will decrease Outcome: Progressing   

## 2020-04-30 ENCOUNTER — Encounter (HOSPITAL_COMMUNITY)
Admission: EM | Disposition: A | Discharge status: Home / Self Care | Payer: Self-pay | Source: Home / Self Care | Attending: Family Medicine

## 2020-04-30 ENCOUNTER — Encounter (HOSPITAL_COMMUNITY): Payer: Self-pay | Admitting: Family

## 2020-04-30 ENCOUNTER — Inpatient Hospital Stay (HOSPITAL_COMMUNITY): Payer: Medicare Other | Admitting: Certified Registered"

## 2020-04-30 ENCOUNTER — Inpatient Hospital Stay (HOSPITAL_COMMUNITY): Payer: Medicare Other

## 2020-04-30 DIAGNOSIS — S93324A Dislocation of tarsometatarsal joint of right foot, initial encounter: Secondary | ICD-10-CM | POA: Diagnosis not present

## 2020-04-30 DIAGNOSIS — Z720 Tobacco use: Secondary | ICD-10-CM | POA: Diagnosis not present

## 2020-04-30 DIAGNOSIS — T148XXA Other injury of unspecified body region, initial encounter: Secondary | ICD-10-CM | POA: Diagnosis not present

## 2020-04-30 DIAGNOSIS — S92351A Displaced fracture of fifth metatarsal bone, right foot, initial encounter for closed fracture: Secondary | ICD-10-CM | POA: Diagnosis not present

## 2020-04-30 DIAGNOSIS — S93326A Dislocation of tarsometatarsal joint of unspecified foot, initial encounter: Secondary | ICD-10-CM

## 2020-04-30 DIAGNOSIS — G9340 Encephalopathy, unspecified: Secondary | ICD-10-CM | POA: Diagnosis not present

## 2020-04-30 DIAGNOSIS — G92 Toxic encephalopathy: Secondary | ICD-10-CM | POA: Diagnosis not present

## 2020-04-30 DIAGNOSIS — N179 Acute kidney failure, unspecified: Secondary | ICD-10-CM | POA: Diagnosis not present

## 2020-04-30 DIAGNOSIS — S92301A Fracture of unspecified metatarsal bone(s), right foot, initial encounter for closed fracture: Secondary | ICD-10-CM | POA: Diagnosis not present

## 2020-04-30 DIAGNOSIS — T6709XA Other heatstroke and sunstroke, initial encounter: Secondary | ICD-10-CM | POA: Diagnosis not present

## 2020-04-30 DIAGNOSIS — Z20822 Contact with and (suspected) exposure to covid-19: Secondary | ICD-10-CM | POA: Diagnosis not present

## 2020-04-30 HISTORY — PX: ORIF TOE FRACTURE: SHX5032

## 2020-04-30 LAB — SURGICAL PCR SCREEN
MRSA, PCR: NEGATIVE
Staphylococcus aureus: NEGATIVE

## 2020-04-30 SURGERY — OPEN REDUCTION INTERNAL FIXATION (ORIF) METATARSAL (TOE) FRACTURE
Anesthesia: Monitor Anesthesia Care | Site: Toe | Laterality: Right

## 2020-04-30 MED ORDER — DEXAMETHASONE SODIUM PHOSPHATE 10 MG/ML IJ SOLN
INTRAMUSCULAR | Status: AC
  Filled 2020-04-30: qty 1

## 2020-04-30 MED ORDER — CLINDAMYCIN PHOSPHATE 900 MG/50ML IV SOLN
INTRAVENOUS | Status: AC
  Administered 2020-04-30: 900 mg via INTRAVENOUS
  Filled 2020-04-30: qty 50

## 2020-04-30 MED ORDER — METOCLOPRAMIDE HCL 5 MG/ML IJ SOLN: 5.0000 mg | Freq: Three times a day (TID) | INTRAMUSCULAR | Status: DC | PRN

## 2020-04-30 MED ORDER — MIDAZOLAM HCL 2 MG/2ML IJ SOLN
INTRAMUSCULAR | Status: AC
  Administered 2020-04-30: 2 mg via INTRAVENOUS
  Filled 2020-04-30: qty 2

## 2020-04-30 MED ORDER — ROCURONIUM BROMIDE 10 MG/ML (PF) SYRINGE
PREFILLED_SYRINGE | INTRAVENOUS | Status: AC
  Filled 2020-04-30: qty 10

## 2020-04-30 MED ORDER — ONDANSETRON HCL 4 MG/2ML IJ SOLN
INTRAMUSCULAR | Status: AC
  Filled 2020-04-30: qty 2

## 2020-04-30 MED ORDER — ENOXAPARIN SODIUM 40 MG/0.4ML ~~LOC~~ SOLN: 40.0000 mg | SUBCUTANEOUS | Status: DC

## 2020-04-30 MED ORDER — PROPOFOL 500 MG/50ML IV EMUL
INTRAVENOUS | Status: DC | PRN
  Administered 2020-04-30: 100 ug/kg/min via INTRAVENOUS

## 2020-04-30 MED ORDER — 0.9 % SODIUM CHLORIDE (POUR BTL) OPTIME
TOPICAL | Status: DC | PRN
  Administered 2020-04-30: 1000 mL

## 2020-04-30 MED ORDER — LIDOCAINE-EPINEPHRINE (PF) 1.5 %-1:200000 IJ SOLN
INTRAMUSCULAR | Status: DC | PRN
  Administered 2020-04-30: 10 mL via PERINEURAL

## 2020-04-30 MED ORDER — LACTATED RINGERS IV SOLN: INTRAVENOUS | Status: DC

## 2020-04-30 MED ORDER — PROPOFOL 10 MG/ML IV BOLUS
INTRAVENOUS | Status: AC
  Filled 2020-04-30: qty 20

## 2020-04-30 MED ORDER — MIDAZOLAM HCL 2 MG/2ML IJ SOLN
INTRAMUSCULAR | Status: AC
  Filled 2020-04-30: qty 2

## 2020-04-30 MED ORDER — MIDAZOLAM HCL 2 MG/2ML IJ SOLN: 2.0000 mg | Freq: Once | INTRAMUSCULAR | Status: AC

## 2020-04-30 MED ORDER — SODIUM CHLORIDE 0.9 % IV SOLN: INTRAVENOUS | Status: DC

## 2020-04-30 MED ORDER — CHLORHEXIDINE GLUCONATE 0.12 % MT SOLN
OROMUCOSAL | Status: AC
  Administered 2020-04-30: 15 mL via OROMUCOSAL
  Filled 2020-04-30: qty 15

## 2020-04-30 MED ORDER — ACETAMINOPHEN 160 MG/5ML PO SOLN: 1000.0000 mg | Freq: Once | ORAL | Status: DC | PRN

## 2020-04-30 MED ORDER — DOCUSATE SODIUM 100 MG PO CAPS
100.0000 mg | ORAL_CAPSULE | Freq: Two times a day (BID) | ORAL | Status: DC
  Administered 2020-04-30 – 2020-05-09 (×19): 100 mg via ORAL
  Filled 2020-04-30 (×19): qty 1

## 2020-04-30 MED ORDER — ENOXAPARIN SODIUM 40 MG/0.4ML ~~LOC~~ SOLN
40.0000 mg | SUBCUTANEOUS | Status: DC
  Administered 2020-04-30 – 2020-05-08 (×9): 40 mg via SUBCUTANEOUS
  Filled 2020-04-30 (×9): qty 0.4

## 2020-04-30 MED ORDER — CHLORHEXIDINE GLUCONATE 0.12 % MT SOLN: 15.0000 mL | Freq: Once | OROMUCOSAL | Status: AC

## 2020-04-30 MED ORDER — FENTANYL CITRATE (PF) 100 MCG/2ML IJ SOLN: 50.0000 ug | Freq: Once | INTRAMUSCULAR | Status: AC

## 2020-04-30 MED ORDER — FENTANYL CITRATE (PF) 100 MCG/2ML IJ SOLN: 25.0000 ug | INTRAMUSCULAR | Status: DC | PRN

## 2020-04-30 MED ORDER — OXYCODONE HCL 5 MG PO TABS
5.0000 mg | ORAL_TABLET | ORAL | Status: DC | PRN
  Administered 2020-04-30: 10 mg via ORAL
  Administered 2020-05-02: 5 mg via ORAL
  Administered 2020-05-03 – 2020-05-06 (×4): 10 mg via ORAL
  Filled 2020-04-30 (×3): qty 2
  Filled 2020-04-30: qty 1
  Filled 2020-04-30 (×2): qty 2

## 2020-04-30 MED ORDER — ACETAMINOPHEN 500 MG PO TABS: 1000.0000 mg | ORAL_TABLET | Freq: Once | ORAL | Status: DC | PRN

## 2020-04-30 MED ORDER — FENTANYL CITRATE (PF) 100 MCG/2ML IJ SOLN
INTRAMUSCULAR | Status: AC
  Administered 2020-04-30: 50 ug via INTRAVENOUS
  Filled 2020-04-30: qty 2

## 2020-04-30 MED ORDER — HYDROMORPHONE HCL 1 MG/ML IJ SOLN
0.5000 mg | INTRAMUSCULAR | Status: DC | PRN
  Filled 2020-04-30: qty 1

## 2020-04-30 MED ORDER — METOCLOPRAMIDE HCL 5 MG PO TABS: 5.0000 mg | ORAL_TABLET | Freq: Three times a day (TID) | ORAL | Status: DC | PRN

## 2020-04-30 MED ORDER — OXYCODONE HCL 5 MG PO TABS: 5.0000 mg | ORAL_TABLET | Freq: Once | ORAL | Status: DC | PRN

## 2020-04-30 MED ORDER — PROPOFOL 1000 MG/100ML IV EMUL
INTRAVENOUS | Status: AC
  Filled 2020-04-30: qty 100

## 2020-04-30 MED ORDER — CLINDAMYCIN PHOSPHATE 900 MG/50ML IV SOLN
900.0000 mg | Freq: Three times a day (TID) | INTRAVENOUS | Status: AC
  Administered 2020-04-30 – 2020-05-01 (×2): 900 mg via INTRAVENOUS
  Filled 2020-04-30 (×3): qty 50

## 2020-04-30 MED ORDER — ACETAMINOPHEN 10 MG/ML IV SOLN: 1000.0000 mg | Freq: Once | INTRAVENOUS | Status: DC | PRN

## 2020-04-30 MED ORDER — BUPIVACAINE-EPINEPHRINE (PF) 0.5% -1:200000 IJ SOLN
INTRAMUSCULAR | Status: DC | PRN
  Administered 2020-04-30: 20 mL via PERINEURAL

## 2020-04-30 MED ORDER — LIDOCAINE 2% (20 MG/ML) 5 ML SYRINGE
INTRAMUSCULAR | Status: AC
  Filled 2020-04-30: qty 5

## 2020-04-30 MED ORDER — OXYCODONE HCL 5 MG/5ML PO SOLN: 5.0000 mg | Freq: Once | ORAL | Status: DC | PRN

## 2020-04-30 MED ORDER — CLINDAMYCIN PHOSPHATE 900 MG/50ML IV SOLN
900.0000 mg | INTRAVENOUS | Status: AC
  Administered 2020-04-30: 900 mg via INTRAVENOUS

## 2020-04-30 MED ORDER — FENTANYL CITRATE (PF) 250 MCG/5ML IJ SOLN
INTRAMUSCULAR | Status: AC
  Filled 2020-04-30: qty 5

## 2020-04-30 SURGICAL SUPPLY — 60 items
BANDAGE ESMARK 6X9 LF (GAUZE/BANDAGES/DRESSINGS) IMPLANT
BIT DRILL 2.4 AO COUPLING CANN (BIT) ×3 IMPLANT
BIT DRILL CANNULATED 3.8MM (DRILL) ×1 IMPLANT
BLADE AVERAGE 25MMX9MM (BLADE) ×1
BLADE AVERAGE 25X9 (BLADE) ×2 IMPLANT
BNDG CMPR 9X4 STRL LF SNTH (GAUZE/BANDAGES/DRESSINGS) ×2
BNDG CMPR 9X6 STRL LF SNTH (GAUZE/BANDAGES/DRESSINGS)
BNDG CMPR MED 10X6 ELC LF (GAUZE/BANDAGES/DRESSINGS) ×2
BNDG COHESIVE 4X5 TAN STRL (GAUZE/BANDAGES/DRESSINGS) ×4 IMPLANT
BNDG COHESIVE 6X5 TAN STRL LF (GAUZE/BANDAGES/DRESSINGS) IMPLANT
BNDG ELASTIC 6X10 VLCR STRL LF (GAUZE/BANDAGES/DRESSINGS) ×3 IMPLANT
BNDG ESMARK 4X9 LF (GAUZE/BANDAGES/DRESSINGS) ×4 IMPLANT
BNDG ESMARK 6X9 LF (GAUZE/BANDAGES/DRESSINGS)
BNDG GAUZE ELAST 4 BULKY (GAUZE/BANDAGES/DRESSINGS) ×4 IMPLANT
COVER SURGICAL LIGHT HANDLE (MISCELLANEOUS) ×5 IMPLANT
DRAPE OEC MINIVIEW 54X84 (DRAPES) ×3 IMPLANT
DRAPE U-SHAPE 47X51 STRL (DRAPES) ×4 IMPLANT
DRILL CANNULATED 3.8MM (DRILL) ×4
DRSG ADAPTIC 3X8 NADH LF (GAUZE/BANDAGES/DRESSINGS) ×4 IMPLANT
DRSG PAD ABDOMINAL 8X10 ST (GAUZE/BANDAGES/DRESSINGS) ×4 IMPLANT
DURAPREP 26ML APPLICATOR (WOUND CARE) ×4 IMPLANT
ELECT REM PT RETURN 9FT ADLT (ELECTROSURGICAL) ×4
ELECTRODE REM PT RTRN 9FT ADLT (ELECTROSURGICAL) ×2 IMPLANT
GAUZE SPONGE 4X4 12PLY STRL (GAUZE/BANDAGES/DRESSINGS) ×4 IMPLANT
GAUZE SPONGE 4X4 12PLY STRL LF (GAUZE/BANDAGES/DRESSINGS) ×3 IMPLANT
GLOVE BIO SURGEON STRL SZ7 (GLOVE) ×6 IMPLANT
GLOVE BIOGEL PI IND STRL 9 (GLOVE) ×2 IMPLANT
GLOVE BIOGEL PI INDICATOR 9 (GLOVE) ×2
GLOVE SURG ORTHO 9.0 STRL STRW (GLOVE) ×4 IMPLANT
GLOVE SURG SS PI 6.5 STRL IVOR (GLOVE) ×6 IMPLANT
GOWN STRL REUS W/ TWL XL LVL3 (GOWN DISPOSABLE) ×6 IMPLANT
GOWN STRL REUS W/TWL XL LVL3 (GOWN DISPOSABLE) ×12
K-WIRE TROC 1.25X150 (WIRE) ×4
KIT BASIN OR (CUSTOM PROCEDURE TRAY) ×4 IMPLANT
KIT TURNOVER KIT B (KITS) ×4 IMPLANT
KWIRE TROC 1.25X150 (WIRE) ×1 IMPLANT
MANIFOLD NEPTUNE II (INSTRUMENTS) ×4 IMPLANT
NS IRRIG 1000ML POUR BTL (IV SOLUTION) ×4 IMPLANT
PACK ORTHO EXTREMITY (CUSTOM PROCEDURE TRAY) ×4 IMPLANT
PAD ABD 8X10 STRL (GAUZE/BANDAGES/DRESSINGS) ×3 IMPLANT
PAD ARMBOARD 7.5X6 YLW CONV (MISCELLANEOUS) ×8 IMPLANT
PAD CAST 4YDX4 CTTN HI CHSV (CAST SUPPLIES) IMPLANT
PADDING CAST COTTON 4X4 STRL (CAST SUPPLIES)
SCREW CANN MONSTER BT 5.5X50 (Screw) ×3 IMPLANT
SCREW CANN PT 4X40 NS (Screw) IMPLANT
SCREW CANN PT 4X42 NS (Screw) IMPLANT
SCREW CANNULATED PT 4.0X40 (Screw) ×4 IMPLANT
SCREW CANNULATED PT 4.0X42 (Screw) ×4 IMPLANT
STAPLER VISISTAT 35W (STAPLE) IMPLANT
SUCTION FRAZIER HANDLE 10FR (MISCELLANEOUS) ×8
SUCTION TUBE FRAZIER 10FR DISP (MISCELLANEOUS) ×3 IMPLANT
SUT ETHILON 2 0 PSLX (SUTURE) ×5 IMPLANT
SUT VIC AB 2-0 CT1 27 (SUTURE) ×8
SUT VIC AB 2-0 CT1 TAPERPNT 27 (SUTURE) ×3 IMPLANT
TAP 5.5 (TAP) ×3 IMPLANT
TOWEL GREEN STERILE (TOWEL DISPOSABLE) ×4 IMPLANT
TOWEL GREEN STERILE FF (TOWEL DISPOSABLE) ×4 IMPLANT
TUBE CONNECTING 12'X1/4 (SUCTIONS) ×1
TUBE CONNECTING 12X1/4 (SUCTIONS) ×3 IMPLANT
WIRE SMOOTH NITINOL 1.6X200 (WIRE) ×3 IMPLANT

## 2020-04-30 NOTE — Progress Notes (Signed)
Spoke to [name] regarding posting versus consent. Per [name] only performing ORIF right foot fracture.

## 2020-04-30 NOTE — Progress Notes (Signed)
Family Medicine Teaching Service Daily Progress Note Intern Pager: [phone]  Patient name: [name] Medical record number: [id number] Date of birth: 14-Sep-1950 Age: 69 y.o. Gender: male  Primary Care Provider: Danna Hefty, DO Consultants: Ortho Code Status: Full  Pt Overview and Major Events to Date:  Admitted 9/8 ORIF 9/14  Assessment and Plan:  Chawn Spraggins is a 69 yo man with PMH of chronic pain, schizophrenia, malingering, homelessness, depression, GERD, hepatitis C, HLD, HTN, tobacco abuse, CKD, and HFpEF.   Crush Injury  Multiple fractures of right foot Denies complaints at this time. No pain on examination. 2+ DP pulses BLE. ORIF today. -No pain control needed at this time  -PT/OT recs for SNF, plans to go to ALF per ASW notes -ORIF scheduled for today -Welcome any recommendations Ortho has   CKD stage IIIa, stable Cr has been stable and has a baseline Creatinine around 1.5. Today Creatinine is 1.54. -AM BMP  -Encourage PO hydration    Schizophrenia Remains at mental baseline.Cooperative and friendly on exam. On vistaril at home 50 mg QHS, proloxin 25 mg inject q 14 days (last dose 9/9), benztropine 1 mg BID. -Continue benztropine -Prolixin q 2 weeks  -Holding vistaril, can restart if needed  -Needs outpatient Psych follow up at discharge    Bacteremia Blood cultures growing Gram positive rods in 1/2 bottles. Likely contaminate.  -Continue to monitor  -see problem: aortic cusp below.    HTN, chronic  HFpEF, 60-65% EF BP was 133/77.Will continue to monitor BP.Holding losartan 100 mg (last filled 03/02/20).  -ContinueAmlodipine10 mg daily -Continue to monitor BP -Holding losartan given upcoming surgery   Normocytic Anemia, chronic, stable  Baseline Hgb 11-12. Today pre surgery Hgb is 10.2.  -Continue to trend  -Follow up outpatient    HLD -Holding home atorvastatin 40 mg.    BPH Chronic and stable. On  Flomax. -Continue home meds   Thickened aortic cusp Echo: LV 60-65%, aortic valve thickened non coronary cusp, consider TEE is suspicious for SBE high to r/o vegetation. Blood cultures growing Gram positive rods in anaerobic bottle only,no leukocytosis, aerobic bottle n ow NG3D. Only one Culture positive for Corynebacterium Minutiiimum. Has no symptoms of infection. -Most likely contamination, nothing to do at the moment -Continue to monitor for any signs of infection   FEN/GI: NPO for Surgery PPx: Lovenox held for surgery  Disposition: Med tele  Prior to Admission Living Arrangement: Unstable Anticipated Discharge Location: SNF Barriers to Discharge: ORIF Anticipated discharge in approximately 1-2 day(s).   Subjective:  Interviewed patient in hallway.  He was being moved down to Pre-OP when I talked with him. He stated he was doing well and had no complaints at this time.  Objective: Temp:  [98.8 F (37.1 C)-99 F (37.2 C)] 98.8 F (37.1 C) (09/14 0430) Pulse Rate:  [68-81] 80 (09/14 0430) Resp:  [16-18] 18 (09/14 0430) BP: (134-153)/(74-89) 153/89 (09/14 0430) SpO2:  [100 %] 100 % (09/14 0430) Weight:  [63.4 kg] 63.4 kg (09/13 2025) Physical Exam:   Please see attendings Attestation.  Laboratory: Recent Labs  Lab 04/24/20 0541 04/28/20 1141 04/29/20 0311  WBC 6.4 4.3 5.5  HGB 11.4* 10.8* 10.2*  HCT 35.9* 33.4* 30.9*  PLT 83* 103* 124*   Recent Labs  Lab 04/23/20 1713 04/24/20 0026 04/26/20 0851 04/26/20 0851 04/27/20 0903 04/28/20 0633 04/29/20 0311  NA 143   < > 140   < > 137 140 138  K 4.0   < > 3.4*   < >  3.7 3.8 3.8  CL 105   < > 103   < > 101 104 102  CO2 28   < > 28   < > 27 27 27   BUN 20   < > 16   < > 16 17 26*  CREATININE 1.77*   < > 1.42*   < > 1.23 1.23 1.54*  CALCIUM 9.4   < > 8.5*   < > 8.2* 8.9 8.7*  PROT 7.2  --  6.1*  --   --   --   --   BILITOT 1.5*  --  0.8  --   --   --   --   ALKPHOS 37*  --  27*  --   --   --   --   ALT  17  --  16  --   --   --   --   AST 31  --  29  --   --   --   --   GLUCOSE 113*   < > 120*   < > 95 90 98   < > = values in this interval not displayed.      Imaging/Diagnostic Tests:  DG Foot 2 Views Right: Evaluation limited by patient positioning. Consider repeat radiographs with an oblique view. Acute, minimally displaced fractures involving the second through fourth metatarsals. Acute appearing avulsion fracture through the base of the fifth metatarsal. Acute, nondisplaced fracture through the lateral malleolus. Questionable nondisplaced fracture involving the dorsal anterior talus.   ECHOCARDIOGRAM COMPLETE: EF 60-65%. No wall motion abnormalities. All structures visualized were normal except- Thickened non coronary cusp consider TEE if suspicion for SBE high to r/o vegetation on non coronay cusp of AV. The aortic valve is tricuspid. Aortic valve regurgitation is mild. Mild to moderate aortic valve sclerosis/calcification is present, without any evidence of aortic stenosis   VAS [place] LOWER EXTREMITY VENOUS (DVT): RIGHT: - There is no evidence of deep vein thrombosis in the lower extremity. - No cystic structure found in the popliteal fossa. LEFT: - No evidence of common femoral vein obstruction.   US Abdomen Limited RUQ: Two small simple hepatic cysts are identified. The exam is otherwise negative.  Briant Cedar, MD 04/30/2020, 5:57 AM PGY-1, [place] Intern pager: [phone], text pages welcome

## 2020-04-30 NOTE — Anesthesia Procedure Notes (Signed)
Anesthesia Regional Block: Popliteal block   Pre-Anesthetic Checklist: ,, timeout performed, Correct Patient, Correct Site, Correct Laterality, Correct Procedure, Correct Position, site marked, Risks and benefits discussed,  Surgical consent,  Pre-op evaluation,  At surgeon's request and post-op pain management  Laterality: Right and Lower  Prep: chloraprep       Needles:  Injection technique: Single-shot     Needle Length: 9cm  Needle Gauge: 22     Additional Needles: Arrow StimuQuik ECHO Echogenic Stimulating PNB Needle  Procedures:,,,, ultrasound used (permanent image in chart),,,,  Narrative:  Start time: 04/30/2020 8:44 AM End time: 04/30/2020 8:48 AM Injection made incrementally with aspirations every 5 mL.  Performed by: Personally  Anesthesiologist: Oleta Mouse, MD

## 2020-04-30 NOTE — Op Note (Signed)
04/30/2020  9:56 AM  PATIENT:  [name]    PRE-OPERATIVE DIAGNOSIS:  Crush Injury Right Foot with Metatarsal fractures, nondisplaced fractures of the second third and fourth metatarsal with displaced fracture base of the fifth metatarsal. Lisfranc dislocation.   POST-OPERATIVE DIAGNOSIS:  Same  PROCEDURE:  OPEN REDUCTION INTERNAL FIXATION (ORIF) RIGHT FOOT  1.  Internal fixation of the Lisfranc dislocation with stabilization of the base of the first metatarsal medial cuneiform and stabilization of the Lisfranc complex. 2.  Open reduction internal fixation base of the fifth metatarsal right foot  SURGEON:  Newt Minion, MD  PHYSICIAN ASSISTANT:None ANESTHESIA:   General  PREOPERATIVE INDICATIONS:  [name] is a  69 y.o. male with a diagnosis of Crush Injury Right Foot with Metatarsal fractures, Lateral Malleolus Fracture who failed conservative measures and elected for surgical management.    The risks benefits and alternatives were discussed with the patient preoperatively including but not limited to the risks of infection, bleeding, nerve injury, cardiopulmonary complications, the need for revision surgery, among others, and the patient was willing to proceed.  OPERATIVE IMPLANTS: Cannulated 5.5 millimeter screw 50 mm in length for the fifth metatarsal fracture and cannulated screws for Lisfranc fracture x2, 4.0 mm @ENCIMAGES @  OPERATIVE FINDINGS: C-arm fluoroscopy verified reduction base of the fifth metatarsal and verified congruent alignment of the Lisfranc complex.  OPERATIVE PROCEDURE: Patient was brought the operating room and underwent a general anesthetic.  After adequate levels anesthesia were obtained patient's right lower extremity was prepped using DuraPrep draped into a sterile field a timeout was called.  A dorsal incision was made in the first webspace this was carried down through the interval of the EHL and the anterior tibial tendon.  The base of the first  metatarsal medial cuneiform was unstable and loose.  A guidewire was inserted from the base of the first metatarsal into the medial cuneiform a 40 mm screw was placed to stabilize the medial column a guidewire was then placed from the medial cuneiform into the base of the second metatarsal C-arm fluoroscopy verified alignment and a 42 mm screw was used to stabilize the Lisfranc complex.  The remaining metatarsal fractures 2 3 and 4 were nondisplaced.  Due to the crush soft tissue injury dorsally it was elected not to pursue further soft tissue dissection to apply internal fixation since the medial and lateral columns were stabilized the soft tissue trauma was felt to be at risk for wound healing and additional internal fixation was not placed.  Wound was irrigated normal saline and the incisions were closed using 2-0 nylon with a sterile dressing patient was taken to PACU in stable condition   DISCHARGE PLANNING:  Antibiotic duration: Clindamycin 24 hours  Weightbearing: Nonweightbearing on the right  Pain medication: Opioid pathway  Dressing care/ Wound VAC: Dry dressing change as needed  Ambulatory devices: Walker or crutches  Discharge to: Anticipate discharge to home  Follow-up: In the office 1 week post operative.

## 2020-04-30 NOTE — Consult Note (Signed)
ORTHOPAEDIC CONSULTATION  REQUESTING PHYSICIAN: Lind Covert, MD  Chief Complaint: Crush injury right foot  HPI: [name] is a 69 y.o. male who presents with crush injury right foot.  Patient states that a large piece of metal was dropped on his foot.  Patient denies any other trauma.  Past Medical History:  Diagnosis Date  . BPH (benign prostatic hyperplasia)   . Colon polyp 2010  . Depression   . GERD (gastroesophageal reflux disease)   . Hepatitis C    "caught it when I had a blood transfusion"  . High cholesterol   . History of blood transfusion    "when I was young"  . Hypertension   . Paranoid schizophrenia ([place])   . Prostate atrophy   . Retinal vein occlusion    Past Surgical History:  Procedure Laterality Date  . CIRCUMCISION  1959  . TONSILLECTOMY     Social History   Socioeconomic History  . Marital status: Married    Spouse name: Not on file  . Number of children: Not on file  . Years of education: Not on file  . Highest education level: Not on file  Occupational History  . Not on file  Tobacco Use  . Smoking status: Current Every Day Smoker    Packs/day: 0.50    Years: 48.00    Pack years: 24.00    Types: Cigarettes  . Smokeless tobacco: Never Used  Substance and Sexual Activity  . Alcohol use: Yes  . Drug use: No  . Sexual activity: Yes  Other Topics Concern  . Not on file  Social History Narrative  . Not on file   Social Determinants of Health   Financial Resource Strain:   . Difficulty of Paying Living Expenses: Not on file  Food Insecurity:   . Worried About Charity fundraiser in the Last Year: Not on file  . Ran Out of Food in the Last Year: Not on file  Transportation Needs:   . Lack of Transportation (Medical): Not on file  . Lack of Transportation (Non-Medical): Not on file  Physical Activity:   . Days of Exercise per Week: Not on file  . Minutes of Exercise per Session: Not on file  Stress:   . Feeling of  Stress : Not on file  Social Connections:   . Frequency of Communication with Friends and Family: Not on file  . Frequency of Social Gatherings with Friends and Family: Not on file  . Attends Religious Services: Not on file  . Active Member of Clubs or Organizations: Not on file  . Attends Archivist Meetings: Not on file  . Marital Status: Not on file   Family History  Problem Relation Age of Onset  . Hypertension Mother   . Diabetes Mother   . Stroke Mother    - negative except otherwise stated in the family history section Allergies  Allergen Reactions  . Lisinopril Other (See Comments)    Cough  . Penicillins Hives  . Amoxicillin Rash  . Ibuprofen Rash   Prior to Admission medications   Medication Sig Start Date End Date Taking? Authorizing Provider  amLODipine (NORVASC) 10 MG tablet Take 1 tablet (10 mg total) by mouth daily. 04/13/18   [name], DO  aspirin 81 MG tablet Take 1 tablet (81 mg total) by mouth daily. 09/24/14   [name], MD  atorvastatin (LIPITOR) 40 MG tablet Take 1 tablet (40 mg total) by mouth  daily. 08/15/19   [name], DO  benztropine (COGENTIN) 0.5 MG tablet Take 0.5 mg by mouth daily. 04/19/20   [provider]  benztropine (COGENTIN) 1 MG tablet Take 1 tablet (1 mg total) by mouth 2 (two) times daily. 02/26/20   Salley Slaughter, NP  cetirizine (ZYRTEC ALLERGY) 10 MG tablet Take 1 tablet (10 mg total) by mouth daily. 02/01/20   [name], [name], PA-C  famotidine (PEPCID) 20 MG tablet Take 1 tablet (20 mg total) by mouth 2 (two) times daily. 05/02/19   [name], DO  fluPHENAZine decanoate (PROLIXIN) 25 MG/ML injection Inject 1 mL (25 mg total) into the muscle every 14 (fourteen) days. Last dose 09/07/12 02/26/20   Salley Slaughter, NP  fluticasone (FLONASE) 50 MCG/ACT nasal spray Place 2 sprays into both nostrils daily. 03/17/20   Couture, Cortni S, PA-C  hydrOXYzine (VISTARIL) 50 MG capsule Take 50 mg by mouth at  bedtime. 02/01/20   [provider]  lidocaine (LIDODERM) 5 % Place 1 patch onto the skin daily. Remove & Discard patch within 12 hours or as directed by MD 04/15/20   Noemi Chapel, MD  losartan (COZAAR) 100 MG tablet Take 1 tablet (100 mg total) by mouth daily. 11/28/19   Tammi Klippel, Sherin, DO  mometasone (NASONEX) 50 MCG/ACT nasal spray Place 2 sprays into the nose daily. 12/29/19   [name], PA-C  Multiple Vitamin (MULTIVITAMIN WITH MINERALS) TABS tablet Take 1 tablet by mouth daily. 06/28/14   [name], MD  sodium chloride (OCEAN) 0.65 % SOLN nasal spray Place 1 spray into both nostrils as needed for congestion. 11/19/19   [name], PA-C  tamsulosin (FLOMAX) 0.4 MG CAPS capsule TAKE 2 CAPSULE BY MOUTH DAILY Patient taking differently: Take 0.8 mg by mouth daily.  02/23/20   Mullis, Kiersten P, DO  traMADol (ULTRAM) 50 MG tablet Take 1 tablet (50 mg total) by mouth every 6 (six) hours as needed. 07/04/18   [name], MD  triamcinolone cream (KENALOG) 0.1 % Apply 1 application topically 2 (two) times daily. For 5 days, then twice daily as needed for rash and itching. 01/03/15   [name], MD  vitamin C (ASCORBIC ACID) 500 MG tablet Take 500 mg by mouth daily.    [provider]  loratadine (CLARITIN) 10 MG tablet Take 1 tablet (10 mg total) by mouth daily. 12/28/19 02/05/20  [name], DO   No results found. - pertinent xrays, CT, MRI studies were reviewed and independently interpreted  Positive ROS: All other systems have been reviewed and were otherwise negative with the exception of those mentioned in the HPI and as above.  Physical Exam: General: Alert, no acute distress Psychiatric: Patient is competent for consent with normal mood and affect Lymphatic: No axillary or cervical lymphadenopathy Cardiovascular: No pedal edema Respiratory: No cyanosis, no use of accessory musculature GI: No organomegaly, abdomen is soft and  non-tender    Images:  @ENCIMAGES @  Labs:  Lab Results  Component Value Date   HGBA1C 5.4 08/21/2016   HGBA1C 5.3 05/15/2015   HGBA1C 5.4 05/28/2014   ESRSEDRATE 3 04/24/2020   CRP 7.3 (H) 04/24/2020   REPTSTATUS 04/28/2020 FINAL 04/24/2020   CULT (A) 04/24/2020    CORYNEBACTERIUM MINUTISSIMUM Standardized susceptibility testing for this organism is not available. Performed at [facility], [place] [address] [place], [place] [address]    LABORGA STAPHYLOCOCCUS SPECIES (COAGULASE NEGATIVE) 07/27/2014    Lab Results  Component Value Date  ALBUMIN 3.4 (L) 04/26/2020   ALBUMIN 4.5 04/23/2020   ALBUMIN 4.5 04/17/2020    Neurologic: Patient does not have protective sensation bilateral lower extremities.   MUSCULOSKELETAL:   Skin: Examination patient does have some ecchymosis bruising and swelling of the right foot he has good range of motion of his ankle and toes no evidence of a compartment syndrome no blistering no skin breakdown.  Patient has a good dorsalis pedis pulse.  Review of the radiographs shows a fracture through the second third fourth and fifth metatarsals with a fracture through the base of the fifth metatarsal these fractures are essentially nondisplaced.  On examination patient is also tender to palpation along the medial column distraction is painful consistent with a ligamentous injury in the Lisfranc complex.  Assessment: Assessment: Lisfranc ligamentous injury with fractures across the second third fourth and fifth metatarsals.  Plan: Plan: Discussed that we will plan to stabilize the medial and lateral columns with internal fixation across the Lisfranc complex a plate across the second metatarsal and intramedullary fixation for the fifth metatarsal.  Risks and benefits were discussed including risk of the wound not healing need for additional surgery.  Patient states he understands wished to proceed at this time.  Thank you for the consult and  the opportunity to see Mr. Desiree Lucy, MD [facility] [phone] 8:38 AM

## 2020-04-30 NOTE — Anesthesia Preprocedure Evaluation (Signed)
Anesthesia Evaluation  Patient identified by MRN, date of birth, ID band Patient confused    Reviewed: Allergy & Precautions, NPO status , Patient's Chart, lab work & pertinent test results  History of Anesthesia Complications Negative for: history of anesthetic complications  Airway Mallampati: III  TM Distance: >3 FB Neck ROM: Full    Dental  (+) Dental Advisory Given   Pulmonary neg shortness of breath, neg COPD, neg recent URI, Current Smoker and Patient abstained from smoking.,    breath sounds clear to auscultation       Cardiovascular hypertension, Pt. on medications (-) angina(-) Past MI and (-) CHF  Rhythm:Regular     Neuro/Psych PSYCHIATRIC DISORDERS Depression Schizophrenia Dementia negative neurological ROS     GI/Hepatic GERD  ,(+) Hepatitis -, C  Endo/Other  negative endocrine ROS  Renal/GU negative Renal ROS     Musculoskeletal  (+) Arthritis , Crush Injury Right Foot with Metatarsal fractures, Lateral Malleolus Fracture   Abdominal   Peds  Hematology  (+) Blood dyscrasia, anemia , Lab Results      Component                Value               Date                      WBC                      5.5                 04/29/2020                HGB                      10.2 (L)            04/29/2020                HCT                      30.9 (L)            04/29/2020                MCV                      89.6                04/29/2020                PLT                      124 (L)             04/29/2020              Anesthesia Other Findings   Reproductive/Obstetrics                             Anesthesia Physical Anesthesia Plan  ASA: II  Anesthesia Plan: MAC and Regional   Post-op Pain Management:    Induction: Intravenous  PONV Risk Score and Plan: 0 and Propofol infusion and Treatment may vary due to age or medical condition  Airway Management Planned: Nasal  Cannula  Additional Equipment: None  Intra-op Plan:   Post-operative Plan:   Informed Consent: I  have reviewed the patients History and Physical, chart, labs and discussed the procedure including the risks, benefits and alternatives for the proposed anesthesia with the patient or authorized representative who has indicated his/her understanding and acceptance.     Dental advisory given  Plan Discussed with: CRNA and Surgeon  Anesthesia Plan Comments:         Anesthesia Quick Evaluation

## 2020-04-30 NOTE — Anesthesia Procedure Notes (Signed)
Procedure Name: MAC Date/Time: 04/30/2020 9:06 AM Performed by: Barrington Ellison, CRNA Pre-anesthesia Checklist: Patient identified, Emergency Drugs available, Suction available, Patient being monitored and Timeout performed Patient Re-evaluated:Patient Re-evaluated prior to induction Oxygen Delivery Method: Simple face mask

## 2020-04-30 NOTE — Progress Notes (Signed)
Attempted to see pt this am. Pt in OR for foot surgery. Will attempt back as schedule.  Please reorder OT services.  Jinger Neighbors, [place] [phone]

## 2020-04-30 NOTE — Plan of Care (Signed)
  Problem: Education: Goal: Knowledge of General Education information will improve Description Including pain rating scale, medication(s)/side effects and non-pharmacologic comfort measures Outcome: Progressing   

## 2020-04-30 NOTE — Progress Notes (Signed)
Orthopedic Tech Progress Note Patient Details:  [name] 10-21-1950 [id number]  Ortho Devices Type of Ortho Device: CAM walker Ortho Device/Splint Location: RLE Ortho Device/Splint Interventions: Ordered, Application, Adjustment   Post Interventions Patient Tolerated: Well Instructions Provided: Care of device, Adjustment of device, Poper ambulation with device   Suann Klier 04/30/2020, 3:27 PM

## 2020-04-30 NOTE — Transfer of Care (Signed)
Immediate Anesthesia Transfer of Care Note  Patient: DELSON DULWORTH  Procedure(s) Performed: OPEN REDUCTION INTERNAL FIXATION (ORIF) RIGHT FOOT METATARSAL FRACTURES (Right Toe)  Patient Location: PACU  Anesthesia Type:MAC and Regional  Level of Consciousness: drowsy and patient cooperative  Airway & Oxygen Therapy: Patient Spontanous Breathing and Patient connected to nasal cannula oxygen  Post-op Assessment: Report given to RN  Post vital signs: Reviewed and stable  Last Vitals:  Vitals Value Taken Time  BP 136/77 04/30/20 0958  Temp    Pulse    Resp 14 04/30/20 0958  SpO2    Vitals shown include unvalidated device data.  Last Pain:  Vitals:   04/30/20 0807  TempSrc:   PainSc: 0-No pain         Complications: No complications documented.

## 2020-04-30 NOTE — Anesthesia Postprocedure Evaluation (Signed)
Anesthesia Post Note  Patient: [name]  Procedure(s) Performed: OPEN REDUCTION INTERNAL FIXATION (ORIF) RIGHT FOOT METATARSAL FRACTURES (Right Toe)     Patient location during evaluation: PACU Anesthesia Type: MAC and Regional Level of consciousness: patient cooperative and awake Pain management: pain level controlled Vital Signs Assessment: post-procedure vital signs reviewed and stable Respiratory status: spontaneous breathing, nonlabored ventilation, respiratory function stable and patient connected to nasal cannula oxygen Cardiovascular status: stable and blood pressure returned to baseline Postop Assessment: no apparent nausea or vomiting Anesthetic complications: no   No complications documented.  Last Vitals:  Vitals:   04/30/20 1029 04/30/20 1045  BP: 134/84 (!) 148/78  Pulse: 87 65  Resp: 17 18  Temp: 36.7 C 36.6 C  SpO2: 99% 100%    Last Pain:  Vitals:   04/30/20 1100  TempSrc:   PainSc: 0-No pain                 Jaylun Fleener

## 2020-05-01 ENCOUNTER — Encounter (HOSPITAL_COMMUNITY): Payer: Self-pay | Admitting: Orthopedic Surgery

## 2020-05-01 DIAGNOSIS — T148XXA Other injury of unspecified body region, initial encounter: Secondary | ICD-10-CM | POA: Diagnosis not present

## 2020-05-01 DIAGNOSIS — G9340 Encephalopathy, unspecified: Secondary | ICD-10-CM | POA: Diagnosis not present

## 2020-05-01 DIAGNOSIS — T6709XA Other heatstroke and sunstroke, initial encounter: Secondary | ICD-10-CM | POA: Diagnosis not present

## 2020-05-01 LAB — BASIC METABOLIC PANEL
Anion gap: 10 (ref 5–15)
BUN: 23 mg/dL (ref 8–23)
CO2: 30 mmol/L (ref 22–32)
Calcium: 8.9 mg/dL (ref 8.9–10.3)
Chloride: 96 mmol/L — ABNORMAL LOW (ref 98–111)
Creatinine, Ser: 1.3 mg/dL — ABNORMAL HIGH (ref 0.61–1.24)
GFR calc Af Amer: >60 mL/min (ref >60)
GFR calc non Af Amer: 56 mL/min — ABNORMAL LOW (ref >60)
Glucose, Bld: 110 mg/dL — ABNORMAL HIGH (ref 70–99)
Potassium: 4.1 mmol/L (ref 3.5–5.1)
Sodium: 136 mmol/L (ref 135–145)

## 2020-05-01 LAB — CBC
HCT: 32.3 % — ABNORMAL LOW (ref 39.0–52.0)
Hemoglobin: 10.8 g/dL — ABNORMAL LOW (ref 13.0–17.0)
MCH: 30.1 pg (ref 26.0–34.0)
MCHC: 33.4 g/dL (ref 30.0–36.0)
MCV: 90 fL (ref 80.0–100.0)
Platelets: 141 10*3/uL — ABNORMAL LOW (ref 150–400)
RBC: 3.59 MIL/uL — ABNORMAL LOW (ref 4.22–5.81)
RDW: 14 % (ref 11.5–15.5)
WBC: 5.7 10*3/uL (ref 4.0–10.5)
nRBC: 0 % (ref 0.0–0.2)

## 2020-05-01 LAB — CULTURE, BLOOD (ROUTINE X 2)
Culture: NO GROWTH
Special Requests: ADEQUATE

## 2020-05-01 MED ORDER — ACETAMINOPHEN 325 MG PO TABS
650.0000 mg | ORAL_TABLET | Freq: Four times a day (QID) | ORAL | Status: DC
  Administered 2020-05-01 – 2020-05-08 (×28): 650 mg via ORAL
  Filled 2020-05-01 (×28): qty 2

## 2020-05-01 NOTE — TOC Progression Note (Signed)
Transition of Care [facility]) - Progression Note    Patient Details  Name: GRAHM ETSITTY MRN: [id number] Date of Birth: 1950-12-18  Transition of Care [facility]) CM/SW Contact  Bartholomew Crews, RN Phone Number: [phone] 05/01/2020, 1:15 PM  Clinical Narrative:     Damaris Schooner with Earlean Polka at [phone] to discuss transition plans to [facility]. Advised of patient's NWB status on operative foot. While they will not be with him 24/7 they can assist him with transfers, and are willing to continue with admission pending bed availability. TOC contact numbers provided. TOC following for transition needs.   Expected Discharge Plan: Assisted Living Barriers to Discharge: Continued Medical Work up  Expected Discharge Plan and Services Expected Discharge Plan: Assisted Living                                               Social Determinants of Health (SDOH) Interventions    Readmission Risk Interventions No flowsheet data found.

## 2020-05-01 NOTE — Progress Notes (Signed)
Patient is comfortable sitting in bed eating breakfast.  Dressings clean dry intact boot is in place.  Brisk capillary refill in his toes   Postop day 1 status post ORIF right foot metatarsal fractures.  Will need orthopedic follow-up 1 week post discharge from a orthopedic standpoint could be discharged when medically stable
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
## 2020-05-01 NOTE — Progress Notes (Signed)
Nutrition Follow-up  DOCUMENTATION CODES:   Non-severe (moderate) malnutrition in context of social or environmental circumstances  INTERVENTION:  Continue MVI daily  Continue Ensure Enlive po BID, each supplement provides 350 kcal and 20 grams of protein    NUTRITION DIAGNOSIS:   Moderate Malnutrition related to social / environmental circumstances as evidenced by percent weight loss, mild fat depletion, mild muscle depletion, moderate muscle depletion.  ongoing  GOAL:   Patient will meet greater than or equal to 90% of their needs  Progressing  MONITOR:   PO intake, Supplement acceptance, Labs, Weight trends, Skin, I & O's  REASON FOR ASSESSMENT:   Consult Assessment of nutrition requirement/status  ASSESSMENT:   69 y.o. male with medical history significant of chronic pain, schizophrenia, malingering, homelessness, depression, GERD, hepatitis C, HLD, HTNTobacco abuse, chronic diastolic heart failure  Admitted for fever of unknown origin and Acute encephalopathy and found to have multiple fractures in R leg/foot  9/14 s/p ORIF R foot  Per MD, pt to discharge in next 1-2 days.   Pt's appetite is good. He has consumed 100% of his last 8 meals. Pt has also been consuming Ensure Enlive well (receiving BID).   Labs reviewed. Medications: Colace, Folvite, MVI, Thiamine  Diet Order:   Diet Order            Diet regular Room service appropriate? Yes; Fluid consistency: Thin  Diet effective now                 EDUCATION NEEDS:   Not appropriate for education at this time  Skin:  Skin Assessment: Skin Integrity Issues: Skin Integrity Issues:: Incisions Incisions: leg  Last BM:  9/14  Height:   Ht Readings from Last 1 Encounters:  04/30/20 5\' 9"  (1.753 m)    Weight:   Wt Readings from Last 1 Encounters:  04/30/20 68 kg    Ideal Body Weight:  72.7 kg  BMI:  Body mass index is 22.14 kg/m.  Estimated Nutritional Needs:   Kcal:   2000-2200  Protein:  110-125 grams  Fluid:  > 2 L    Larkin Ina, MS, RD, LDN RD pager number and weekend/on-call pager number located in [place].

## 2020-05-01 NOTE — TOC Progression Note (Addendum)
Transition of Care [facility]) - Progression Note    Patient Details  Name: FLETCHER OSTERMILLER MRN: [id number] Date of Birth: [birth year]  Transition of Care [facility]) CM/SW [place], [place] Phone Number: 05/01/2020, 12:44 PM  Clinical Narrative:    CSW spoke with Aaron Edelman from [place].  Aaron Edelman was planning on coming to hospital to see pt today but will not be able to make it today due to illness.  Aaron Edelman suggested following up with Ms. Foreman at [phone].  CSW will follow up with Du[facility] Lope for pt's placement. TOC team will continue to assist with disposition planning.  Expected Discharge Plan: Assisted Living Barriers to Discharge: Continued Medical Work up  Expected Discharge Plan and Services Expected Discharge Plan: Assisted Living                                               Social Determinants of Health (SDOH) Interventions    Readmission Risk Interventions No flowsheet data found.

## 2020-05-01 NOTE — Progress Notes (Addendum)
Family Medicine Teaching Service Daily Progress Note Intern Pager: [phone]  Patient name: [name] Medical record number: [id number] Date of birth: 06-Dec-1950 Age: 69 y.o. Gender: male  Primary Care Provider: Danna Hefty, DO Consultants: Ortho Code Status: Full  Pt Overview and Major Events to Date:  Admitted 9/8 ORIF 9/14  Assessment and Plan:  [name] is a 69 yo man with PMH of chronic pain, schizophrenia, malingering, homelessness, depression, GERD, hepatitis C, HLD, HTN, tobacco abuse, CKD, and HFpEF.   Crush Injury  Multiple fractures of right foot Denies complaints at this time. No pain on examination. 2+ DP pulses BLE.ORIF today. Internal fixation of the Lisfranc dislocation with stabilization of the base of the first metatarsal medial cuneiform and stabilization of the Lisfranc complex. Open reduction internal fixation base of the fifth metatarsal right foot. Is resting comfortably has had some complaints about mild foot pain, will start scheduled tylenol for pain control.. -Starting Tylenol 650 mg q6 for pain control. -PT/OT recs for SNF, plans to go to ALF per ASW notes -Will finish 24 hr Clindamycin ordered by Ortho   CKD stage IIIa, stable Cr has been stable and has a baseline Creatinine around 1.5.  Creatinine at baseline. -AM BMP  -Encourage PO hydration    Schizophrenia Remains at mental baseline.Cooperative and friendly on exam. On vistaril at home 50 mg QHS, proloxin 25 mg inject q 14 days (last dose 9/9), benztropine 1 mg BID. -Continue benztropine -Prolixin q 2 weeks  -Holding vistaril, can restart if needed  -Needsoutpatient Psych follow upat discharge    Bacteremia Blood cultures growing Gram positive rods in 1/2 bottles. Likely contaminate.  -Continue tomonitor -see problem: aortic cusp below.    HTN, chronic  HFpEF, 60-65% EF BPwas 133/77.Will continue to monitor BP.Holdinglosartan 100 mg (last filled  03/02/20).  -ContinueAmlodipine10 mg daily -Continueto monitor BP -Holding losartan given upcoming surgery   Normocytic Anemia, chronic, stable  Baseline Hgb 11-12. Today Hgb is 10.8. -Continue to trend  -Follow upoutpatient   HLD -Holding home atorvastatin 40 mg.    BPH Chronic and stable. On Flomax. -Continue home meds   Thickened aortic cusp Echo: LV 60-65%, aortic valve thickened non coronary cusp, consider TEE is suspicious for SBE high to r/o vegetation. Blood cultures growing Gram positive rods in anaerobic bottle only,no leukocytosis, aerobic bottle n ow NG3D.Only oneCulture positive for Corynebacterium Minutiiimum. Has no symptoms of infection. -Most likely contamination,nothing to do at the moment -Continue to monitor for any signs of infection  FEN/GI: Regular PPx: Lovenox  Disposition: Tele Med  Prior to Admission Living Arrangement: Unstable Anticipated Discharge Location: SNF Barriers to Discharge: Pain/Medical Management Anticipated discharge in approximately 2-4 day(s).   Subjective:  Interviewed patient at bedside.  He reports doing well. He does not complain of pain in his Right foot. He has no complaints at this time. He asked about discharge. Discussed that plan was to discharge in the next 1-2 days.  Objective: Temp:  [97.8 F (36.6 C)-98.2 F (36.8 C)] 98 F (36.7 C) (09/15 0420) Pulse Rate:  [65-87] 75 (09/15 0420) Resp:  [13-18] 17 (09/15 0420) BP: (134-152)/(76-94) 135/76 (09/15 0420) SpO2:  [97 %-100 %] 100 % (09/15 0420) Weight:  [68 kg] 68 kg (09/14 2123) Physical Exam:  Physical Exam Vitals and nursing note reviewed.  Constitutional:      General: He is not in acute distress.    Appearance: Normal appearance. He is not ill-appearing or toxic-appearing.  HENT:  Head: Normocephalic and atraumatic.  Cardiovascular:     Rate and Rhythm: Normal rate and regular rhythm.     Pulses: Normal pulses.          Radial  pulses are 2+ on the right side and 2+ on the left side.       Dorsalis pedis pulses are 2+ on the right side and 2+ on the left side.     Heart sounds: Normal heart sounds, S1 normal and S2 normal. No murmur heard.   Pulmonary:     Effort: Pulmonary effort is normal. No respiratory distress.     Breath sounds: Normal breath sounds. No wheezing.  Musculoskeletal:     Right lower leg: No edema.     Left lower leg: No edema.  Neurological:     Mental Status: He is alert. Mental status is at baseline.      Laboratory: Recent Labs  Lab 04/28/20 1141 04/29/20 0311  WBC 4.3 5.5  HGB 10.8* 10.2*  HCT 33.4* 30.9*  PLT 103* 124*   Recent Labs  Lab 04/26/20 0851 04/26/20 0851 04/27/20 0903 04/28/20 0633 04/29/20 0311  NA 140   < > 137 140 138  K 3.4*   < > 3.7 3.8 3.8  CL 103   < > 101 104 102  CO2 28   < > 27 27 27   BUN 16   < > 16 17 26*  CREATININE 1.42*   < > 1.23 1.23 1.54*  CALCIUM 8.5*   < > 8.2* 8.9 8.7*  PROT 6.1*  --   --   --   --   BILITOT 0.8  --   --   --   --   ALKPHOS 27*  --   --   --   --   ALT 16  --   --   --   --   AST 29  --   --   --   --   GLUCOSE 120*   < > 95 90 98   < > = values in this interval not displayed.     Imaging/Diagnostic Tests:   Briant Cedar, MD 05/01/2020, 5:52 AM PGY-1, [place] Intern pager: [phone], text pages welcome
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
## 2020-05-02 DIAGNOSIS — S92351D Displaced fracture of fifth metatarsal bone, right foot, subsequent encounter for fracture with routine healing: Secondary | ICD-10-CM | POA: Diagnosis not present

## 2020-05-02 DIAGNOSIS — T6709XA Other heatstroke and sunstroke, initial encounter: Secondary | ICD-10-CM | POA: Diagnosis not present

## 2020-05-02 DIAGNOSIS — G9340 Encephalopathy, unspecified: Secondary | ICD-10-CM | POA: Diagnosis not present

## 2020-05-02 DIAGNOSIS — I10 Essential (primary) hypertension: Secondary | ICD-10-CM | POA: Diagnosis not present

## 2020-05-02 LAB — BASIC METABOLIC PANEL
Anion gap: 7 (ref 5–15)
BUN: 21 mg/dL (ref 8–23)
CO2: 30 mmol/L (ref 22–32)
Calcium: 9.2 mg/dL (ref 8.9–10.3)
Chloride: 101 mmol/L (ref 98–111)
Creatinine, Ser: 1.18 mg/dL (ref 0.61–1.24)
GFR calc Af Amer: >60 mL/min (ref >60)
GFR calc non Af Amer: >60 mL/min (ref >60)
Glucose, Bld: 94 mg/dL (ref 70–99)
Potassium: 4.2 mmol/L (ref 3.5–5.1)
Sodium: 138 mmol/L (ref 135–145)

## 2020-05-02 NOTE — Progress Notes (Signed)
Family Medicine Teaching Service Daily Progress Note Intern Pager: [phone]  Patient name: [name] Medical record number: [id number] Date of birth: 16-Sep-1950 Age: 69 y.o. Gender: male  Primary Care Provider: Danna Hefty, DO Consultants: Ortho Code Status: Full  Pt Overview and Major Events to Date:  Admitted 9/8 ORIF 9/14  Assessment and Plan:  [name] is a 69 yo man with PMH of chronic pain, schizophrenia, malingering, homelessness, depression, GERD, hepatitis C, HLD, HTN, tobacco abuse, CKD, and HFpEF.   Crush Injury  Multiple fractures of right foot Denies complaints at this time. No pain on examination. 2+ DP pulses BLE.ORIF today. Internal fixation of the Lisfranc dislocation with stabilization of the base of the first metatarsal medial cuneiform and stabilization of the Lisfranc complex. Open reduction internal fixation base of the fifth metatarsal right foot. Is resting comfortably has had some complaints about mild foot pain, will start scheduled tylenol for pain control.. -Starting Tylenol 650 mg q6 for pain control. -PT/OT recs for SNF, plans to go to ALF per ASW notes   CKD stage IIIa, stable Cr has been stableand has a baseline Creatinine is 1.18 today. Creatinineat baseline.  -Encourage PO hydration    Schizophrenia Remains at mental baseline.Cooperative and friendly on exam. On vistaril at home 50 mg QHS, proloxin 25 mg inject q 14 days (last dose 9/9), benztropine 1 mg BID. -Continue benztropine -Prolixin q 2 weeks  -Holding vistaril, can restart if needed  -Needsoutpatient Psych follow upat discharge    Bacteremia Blood cultures growing Gram positive rods in 1/2 bottles. Likely contaminate.  -Continue tomonitor -see problem: aortic cusp below.    HTN, chronic  HFpEF, 60-65% EF BPwas 126/70.Will continue to monitor BP.Holdinglosartan 100 mg (last filled 03/02/20).  -ContinueAmlodipine10 mg daily -Continueto  monitor BP -Holding losartan given upcoming surgery   Normocytic Anemia, chronic, stable  Baseline Hgb 11-12. YesterdayHgb is 10.8. -Continue to trend -Follow upoutpatient   HLD -Holding home atorvastatin 40 mg.    BPH Chronic and stable. On Flomax. -Continue home meds   Thickened aortic cusp Echo: LV 60-65%, aortic valve thickened non coronary cusp, consider TEE is suspicious for SBE high to r/o vegetation. Blood cultures growing Gram positive rods in anaerobic bottle only,no leukocytosis, aerobic bottle n ow NG3D.Only oneCulture positive for Corynebacterium Minutiiimum. Has no symptoms of infection. -Most likely contamination,nothing to do at the moment -Continue to monitor for any signs of infection  FEN/GI: Regular PPx: Lovenox  Disposition: Med tele  Prior to Admission Living Arrangement: Unstable Anticipated Discharge Location: SNF Barriers to Discharge: SNF placement Anticipated discharge in approximately 0-2 day(s).   Subjective:  Interviewed patient at bedside.  He reports that his leg is not hurting him. He reports that the scheduled tylenol is controlling her pain.   Objective: Temp:  [98 F (36.7 C)-99.4 F (37.4 C)] 99.4 F (37.4 C) (09/15 2000) Pulse Rate:  [67-78] 74 (09/15 2000) Resp:  [16-18] 16 (09/15 2000) BP: (126-146)/(70-82) 126/70 (09/15 2000) SpO2:  [98 %-100 %] 100 % (09/15 1813) Physical Exam:  Physical Exam Vitals and nursing note reviewed.  Constitutional:      General: He is not in acute distress.    Appearance: Normal appearance. He is not ill-appearing or toxic-appearing.  HENT:     Head: Normocephalic and atraumatic.  Cardiovascular:     Rate and Rhythm: Normal rate and regular rhythm.     Pulses: Normal pulses.          Radial pulses are  2+ on the right side and 2+ on the left side.       Dorsalis pedis pulses are 2+ on the left side.     Heart sounds: Normal heart sounds, S1 normal and S2 normal. No  murmur heard.   Pulmonary:     Effort: Pulmonary effort is normal. No respiratory distress.     Breath sounds: Normal breath sounds. No wheezing.  Musculoskeletal:     Left lower leg: No edema.     Comments: Able to move toes of Right foot  Neurological:     Mental Status: He is alert. Mental status is at baseline.      Laboratory: Recent Labs  Lab 04/28/20 1141 04/29/20 0311 05/01/20 0537  WBC 4.3 5.5 5.7  HGB 10.8* 10.2* 10.8*  HCT 33.4* 30.9* 32.3*  PLT 103* 124* 141*   Recent Labs  Lab 04/26/20 0851 04/27/20 0903 04/29/20 0311 05/01/20 0537 05/02/20 0409  NA 140   < > 138 136 138  K 3.4*   < > 3.8 4.1 4.2  CL 103   < > 102 96* 101  CO2 28   < > 27 30 30   BUN 16   < > 26* 23 21  CREATININE 1.42*   < > 1.54* 1.30* 1.18  CALCIUM 8.5*   < > 8.7* 8.9 9.2  PROT 6.1*  --   --   --   --   BILITOT 0.8  --   --   --   --   ALKPHOS 27*  --   --   --   --   ALT 16  --   --   --   --   AST 29  --   --   --   --   GLUCOSE 120*   < > 98 110* 94   < > = values in this interval not displayed.      Imaging/Diagnostic Tests:  DG Foot 2 Views Right: Evaluation limited by patient positioning. Consider repeat radiographs with an oblique view. Acute, minimally displaced fractures involving the second through fourth metatarsals. Acute appearing avulsion fracture through the base of the fifth metatarsal. Acute, nondisplaced fracture through the lateral malleolus. Questionable nondisplaced fracture involving the dorsal anterior talus.   ECHOCARDIOGRAM COMPLETE: EF 60-65%. No wall motion abnormalities. All structures visualized were normal except- Thickened non coronary cusp consider TEE if suspicion for SBE high to r/o vegetation on non coronay cusp of AV. The aortic valve is tricuspid. Aortic valve regurgitation is mild. Mild to moderate aortic valve sclerosis/calcification is present, without any evidence of aortic stenosis   VAS [place] LOWER EXTREMITY VENOUS (DVT): RIGHT: -  There is no evidence of deep vein thrombosis in the lower extremity. - No cystic structure found in the popliteal fossa. LEFT: - No evidence of common femoral vein obstruction.   US Abdomen Limited RUQ: Two small simple hepatic cysts are identified. The exam is otherwise negative.  Briant Cedar, MD 05/02/2020, 6:11 AM PGY-1, [place] Intern pager: [phone], text pages welcome

## 2020-05-02 NOTE — Care Management Important Message (Signed)
Important Message  Patient Details  Name: [name] MRN: [id number] Date of Birth: [birth year]   Medicare Important Message Given:  Yes - Important Message mailed due to current National Emergency  Verbal consent obtained due to current National Emergency  Relationship to patient: Self Contact Name: Teng Decou Call Date: 05/02/20  Time: 1048 Phone: [phone] Outcome: No Answer/Busy Important Message mailed to: Patient address on file    Delorse Lek 05/02/2020, 10:49 AM

## 2020-05-02 NOTE — Plan of Care (Signed)
  Problem: Education: Goal: Knowledge of General Education information will improve Description: Including pain rating scale, medication(s)/side effects and non-pharmacologic comfort measures Outcome: Progressing   Problem: Safety: Goal: Ability to remain free from injury will improve Outcome: Progressing   

## 2020-05-03 ENCOUNTER — Encounter (HOSPITAL_COMMUNITY): Payer: Self-pay | Admitting: Orthopedic Surgery

## 2020-05-03 DIAGNOSIS — G9340 Encephalopathy, unspecified: Secondary | ICD-10-CM | POA: Diagnosis not present

## 2020-05-03 DIAGNOSIS — S92351D Displaced fracture of fifth metatarsal bone, right foot, subsequent encounter for fracture with routine healing: Secondary | ICD-10-CM | POA: Diagnosis not present

## 2020-05-03 DIAGNOSIS — T6709XA Other heatstroke and sunstroke, initial encounter: Secondary | ICD-10-CM | POA: Diagnosis not present

## 2020-05-03 DIAGNOSIS — I10 Essential (primary) hypertension: Secondary | ICD-10-CM | POA: Diagnosis not present

## 2020-05-03 LAB — BASIC METABOLIC PANEL
Anion gap: 10 (ref 5–15)
BUN: 26 mg/dL — ABNORMAL HIGH (ref 8–23)
CO2: 26 mmol/L (ref 22–32)
Calcium: 9 mg/dL (ref 8.9–10.3)
Chloride: 101 mmol/L (ref 98–111)
Creatinine, Ser: 1.31 mg/dL — ABNORMAL HIGH (ref 0.61–1.24)
GFR calc Af Amer: >60 mL/min (ref >60)
GFR calc non Af Amer: 56 mL/min — ABNORMAL LOW (ref >60)
Glucose, Bld: 108 mg/dL — ABNORMAL HIGH (ref 70–99)
Potassium: 4.1 mmol/L (ref 3.5–5.1)
Sodium: 137 mmol/L (ref 135–145)

## 2020-05-03 NOTE — Progress Notes (Addendum)
Patient was restless throughout the night and tried to get out of bed without assistance and required frequent redirection. During these times, patient expressed the need to go to the bathroom to void. Patient was advised that a condom catheter has being placed on him for his safety and can void anytime he felt the need to. Patient was still waking up intermittently and tried to get out of bed without assistance despite instructions. Patient's pain was addressed and made comfortable at this time to promote rest. MD on call, notified. Will continue to monitor.   Milagros Loll, RN.
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
## 2020-05-03 NOTE — Progress Notes (Signed)
Family Medicine Teaching Service Daily Progress Note Intern Pager: [phone]  Patient name: [name] Medical record number: [id number] Date of birth: 11/03/1950 Age: 69 y.o. Gender: male  Primary Care Provider: Danna Hefty, DO Consultants: Ortho Code Status: Full  Pt Overview and Major Events to Date:  Admitted 9/8 ORIF 9/14  Assessment and Plan:  [name] is a 69 yo man with PMH of chronic pain, schizophrenia, malingering, homelessness, depression, GERD, hepatitis C, HLD, HTN, tobacco abuse, CKD, and HFpEF.   Crush Injury  Multiple fractures of right foot Denies complaints at this time. No pain on examination. 2+ DP pulses BLE.ORIF today.Internal fixation of the Lisfranc dislocation with stabilization of the base of the first metatarsal medial cuneiform and stabilization of the Lisfranc complex. Open reduction internal fixation base of the fifth metatarsal right foot.Is resting comfortablyhas had some complaints about mild foot pain, will start scheduled tylenol for pain control.. -Starting Tylenol 650 mg q6 for pain control. -PT/OT recs for SNF, plans to go to ALF per ASW notes   CKD stage IIIa, stable Cr has been stableand has a baseline Creatinine of 1.18. Creatinineis 1.31.  -Encourage PO hydration    Schizophrenia Remains at mental baseline.Cooperative and friendly on exam. On vistaril at home 50 mg QHS, proloxin 25 mg inject q 14 days (last dose 9/9), benztropine 1 mg BID. -Continue benztropine -Prolixin q 2 weeks  -Holding vistaril, can restart if needed  -Needsoutpatient Psych follow upat discharge    Bacteremia Blood cultures growing Gram positive rods in 1/2 bottles. Likely contaminate.  -Continue tomonitor -see problem: aortic cusp below.    HTN, chronic  HFpEF, 60-65% EF BPwas 126/70.Will continue to monitor BP.Holdinglosartan 100 mg (last filled 03/02/20).  -ContinueAmlodipine10 mg daily -Continueto monitor  BP -Holding losartan given upcoming surgery   Normocytic Anemia, chronic, stable  Baseline Hgb 11-12. 9/15 Hgb is 10.8. -Continue to trend -Follow upoutpatient   HLD -Holding home atorvastatin 40 mg.    BPH Chronic and stable. On Flomax. -Continue home meds   Thickened aortic cusp Echo: LV 60-65%, aortic valve thickened non coronary cusp, consider TEE is suspicious for SBE high to r/o vegetation. Blood cultures growing Gram positive rods in anaerobic bottle only,no leukocytosis, aerobic bottle n ow NG3D.Only oneCulture positive for Corynebacterium Minutiiimum. Has no symptoms of infection. -Most likely contamination,nothing to do at the moment -Continue to monitor for any signs of infection  FEN/GI: Regular PPx: Lovenox  Disposition: Med-Surg  Prior to Admission Living Arrangement: Unstable Anticipated Discharge Location: SNF Barriers to Discharge: SNF placement Anticipated discharge in approximately 0-2 day(s).   Subjective:  Interviewed patient at bedside.  He reports feeling well today. He state his pain is well controlled. He has no other concerns at this time.  Objective: Temp:  [97.8 F (36.6 C)-98.4 F (36.9 C)] 97.8 F (36.6 C) (09/17 0413) Pulse Rate:  [73-103] 73 (09/17 0413) Resp:  [16-18] 16 (09/17 0413) BP: (100-139)/(77-85) 136/77 (09/17 0413) SpO2:  [100 %] 100 % (09/17 0413) Physical Exam:  Physical Exam Vitals and nursing note reviewed.  Constitutional:      General: He is not in acute distress.    Appearance: Normal appearance. He is not ill-appearing or toxic-appearing.  HENT:     Head: Normocephalic and atraumatic.  Cardiovascular:     Rate and Rhythm: Normal rate and regular rhythm.     Pulses: Normal pulses.          Radial pulses are 2+ on the right side  and 2+ on the left side.       Dorsalis pedis pulses are 2+ on the right side and 2+ on the left side.     Heart sounds: Normal heart sounds, S1 normal and S2  normal.  Pulmonary:     Effort: Pulmonary effort is normal. No respiratory distress.     Breath sounds: Normal breath sounds. No wheezing.  Abdominal:     General: There is no distension.     Tenderness: There is no abdominal tenderness.  Musculoskeletal:     Right lower leg: No edema.     Left lower leg: No edema.  Neurological:     Mental Status: He is alert. Mental status is at baseline.      Laboratory: Recent Labs  Lab 04/28/20 1141 04/29/20 0311 05/01/20 0537  WBC 4.3 5.5 5.7  HGB 10.8* 10.2* 10.8*  HCT 33.4* 30.9* 32.3*  PLT 103* 124* 141*   Recent Labs  Lab 04/26/20 0851 04/27/20 0903 04/29/20 0311 05/01/20 0537 05/02/20 0409  NA 140   < > 138 136 138  K 3.4*   < > 3.8 4.1 4.2  CL 103   < > 102 96* 101  CO2 28   < > 27 30 30   BUN 16   < > 26* 23 21  CREATININE 1.42*   < > 1.54* 1.30* 1.18  CALCIUM 8.5*   < > 8.7* 8.9 9.2  PROT 6.1*  --   --   --   --   BILITOT 0.8  --   --   --   --   ALKPHOS 27*  --   --   --   --   ALT 16  --   --   --   --   AST 29  --   --   --   --   GLUCOSE 120*   < > 98 110* 94   < > = values in this interval not displayed.      Imaging/Diagnostic Tests:  DG Foot 2 Views Right:Evaluation limited by patient positioning. Consider repeat radiographs with an oblique view. Acute, minimally displaced fractures involving the second through fourth metatarsals.Acute appearing avulsion fracture through the base of the fifth metatarsal. Acute, nondisplaced fracture through the lateral malleolus.Questionable nondisplaced fracture involving the dorsal anterior talus.   ECHOCARDIOGRAM COMPLETE: EF 60-65%. No wall motion abnormalities. All structures visualized were normal except-Thickened non coronary cusp consider TEE if suspicion for SBE high to r/o vegetation on non coronay cusp of AV. The aortic valve is tricuspid. Aortic valve regurgitation is mild. Mild to moderate aortic valve sclerosis/calcification is present, without any  evidence of aortic stenosis   VAS [place] LOWER EXTREMITY VENOUS (DVT):RIGHT: - There is no evidence of deep vein thrombosis in the lower extremity. - No cystic structure found in the popliteal fossa. LEFT: - No evidence of common femoral vein obstruction.   US Abdomen Limited RUQ:Two small simple hepatic cysts are identified. The exam is otherwise negative.   Briant Cedar, MD 05/03/2020, 7:45 AM PGY-1, [place] Intern pager: [phone], text pages welcome

## 2020-05-04 DIAGNOSIS — S92351D Displaced fracture of fifth metatarsal bone, right foot, subsequent encounter for fracture with routine healing: Secondary | ICD-10-CM | POA: Diagnosis not present

## 2020-05-04 DIAGNOSIS — G9340 Encephalopathy, unspecified: Secondary | ICD-10-CM | POA: Diagnosis not present

## 2020-05-04 DIAGNOSIS — I10 Essential (primary) hypertension: Secondary | ICD-10-CM | POA: Diagnosis not present

## 2020-05-04 DIAGNOSIS — F200 Paranoid schizophrenia: Secondary | ICD-10-CM | POA: Diagnosis not present

## 2020-05-04 DIAGNOSIS — T6709XA Other heatstroke and sunstroke, initial encounter: Secondary | ICD-10-CM | POA: Diagnosis not present

## 2020-05-04 MED ORDER — POLYETHYLENE GLYCOL 3350 17 G PO PACK
17.0000 g | PACK | Freq: Two times a day (BID) | ORAL | Status: DC
  Administered 2020-05-04 – 2020-05-09 (×11): 17 g via ORAL
  Filled 2020-05-04 (×11): qty 1

## 2020-05-04 MED ORDER — HALOPERIDOL LACTATE 5 MG/ML IJ SOLN
2.0000 mg | Freq: Once | INTRAMUSCULAR | Status: AC
  Administered 2020-05-04: 2 mg via INTRAVENOUS

## 2020-05-04 MED ORDER — HALOPERIDOL LACTATE 5 MG/ML IJ SOLN
INTRAMUSCULAR | Status: AC
  Filled 2020-05-04: qty 1

## 2020-05-04 MED ORDER — POLYETHYLENE GLYCOL 3350 17 G PO PACK: 17.0000 g | PACK | Freq: Every day | ORAL | Status: DC | PRN

## 2020-05-04 MED ORDER — HYDROXYZINE HCL 25 MG PO TABS
50.0000 mg | ORAL_TABLET | Freq: Every day | ORAL | Status: AC
  Filled 2020-05-04: qty 2
  Filled 2020-05-04: qty 1

## 2020-05-04 NOTE — Progress Notes (Signed)
Patient became restless and tried getting out of bed without assistance several times. Pt was brought to the nurses' station for close supervision, however, pt continued to be restless and difficult to redirect. Lilland, DO was notified and requested to come to bedside to see patient and ordered hydroxyzine for pt. However, pt became agitated and combative towards staff, so Lilland, DO was notified again and she advised that physical restraints may cause pt to be more agitated and ordered haldol IV. Pt currently asleep without distress. Will continue to monitor.   Kimyata Milich,RN.

## 2020-05-04 NOTE — Progress Notes (Addendum)
FPTS Interim Progress Note  S:Called to floor by nursing due to reports of increasing agitation in the patient and limited effectiveness of re-direction. Patient reportedly continues to try and get up out of his bed and now chair and he is non-weight bearing status s/p ORIF of the right foot.   O: BP 130/81 (BP Location: Right Arm)   Pulse 79   Temp 98.5 F (36.9 C) (Oral)   Resp 16   Ht 5\' 9"  (1.753 m)   Wt 64.5 kg   SpO2 100%   BMI 21.00 kg/m   Gen: patient appears mildly agitated, attempting to get out of the chair to go to the bathroom despite having a condom catheter on. Patient became mildly irritated with the nurse who re-directed him.  A/P: 69 year old male with a hx of schizphrenia, depression, CKD, HFpEF is s/p ORIF for crush injury to right foot with multiple fractures. Patient takes fluphenazine, vistaril, and benztropine at home for schizophrenia. His vistaril has been held while at the hospital, giving a one time dose of home vistaril 50mg  tonight. If continued agitation, will likely consult with pharmacy for best next step due to current medications and conditions.  [name], DO 05/04/2020, 1:25 AM PGY-1, [facility] pager [phone]   Addendum: Called about patient again, nursing reports that he is becoming combative and are requesting physical restraints. Due to the patient's history of schizophrenia I do not think this would be an appropriate choice and could escalate the stress and situation. Consulted with pharmacy and recommended that Haldol would be okay, ordered a one time 2mg  dose. Continue to monitor, per pharmacy, he will likely be able to have a second dose if not resolved.   [name], DO PGY-1 [place]

## 2020-05-04 NOTE — Progress Notes (Signed)
Family Medicine Teaching Service Daily Progress Note Intern Pager: [phone]  Patient name: [name] Medical record number: [id number] Date of birth: 1950-12-01 Age: 69 y.o. Gender: male  Primary Care Provider: Danna Hefty, DO Consultants: Ortho Code Status: Full  Pt Overview and Major Events to Date:  Admitted 9/8 ORIF 9/14  Assessment and Plan: [name] is a 69 yo man with PMH of chronic pain, schizophrenia, malingering, homelessness, depression, GERD, hepatitis C, HLD, HTN, tobacco abuse, CKD, and HFpEF.   Crush Injury  Multiple fractures of right foot POD# 4 s/p ORIF. PT reports that patient is unable to understand or apply NWB status despite instructions.  Patient did not appear to have pain on exam today --Tylenol 650 mg q6 for pain control. -PT/OT recs for SNF, plans to go to ALF per ASW notes   CKD stage IIIa, stable Cr has been stableand has a baseline Creatinineof 1.18. Creatinineis 1.31.  -Encourage PO hydration    Schizophrenia Overnight nursing reported agitation that eventually progressed to combative behavior, much improved this morning. Patient was given a one time dose of Haldol 2mg . Home meds include: Vistaril at home 50 mg QHS, proloxin 25 mg inject q 14 days (last dose 9/9), benztropine 1 mg BID. -Continue benztropine -Prolixin q 2 weeks  -Holding vistaril, can restart if needed  -Needsoutpatient Psych follow upat discharge  - Monitor mental status and agitation, especially at night   Bacteremia Blood cultures growing Gram positive rods in 1/2 bottles. Likely contaminate.  -Continue tomonitor -see problem: aortic cusp below.    HTN, chronic  HFpEF, 60-65% EF BPwas 126/70.Will continue to monitor BP.Holdinglosartan 100 mg (last filled 03/02/20).  -ContinueAmlodipine10 mg daily -Continueto monitor BP -Holding losartan given upcoming surgery   Normocytic Anemia, chronic, stable  Baseline Hgb 11-12.9/15  Hgb is 10.8. -Continue to trend -Follow upoutpatient  HLD -Holding home atorvastatin 40 mg.   BPH Chronic and stable. On Flomax. -Continue home meds   Thickened aortic cusp Echo: LV 60-65%, aortic valve thickened non coronary cusp. Only oneblood culture positive for Corynebacterium Minutiiimum. Has no symptoms of infection. -Most likely contamination,nothing to do at the moment -Continue to monitor for any signs of infection  FEN/GI: Regular PPx: Lovenox   Dispo:  Patient From: Other (unstable housing)  Planned Disposition: ALF  Expected discharge date: 05/08/20  Medically stable for discharge: No   Subjective:  Overnight patient was agitated which escalated to being combative with nursing.  Patient was given a one-time dose of 2 mg of Haldol, no more calls overnight.  This morning patient was in good spirits again, he did not report any pain.  Patient appears to be at baseline this morning.  Objective: Temp:  [97.8 F (36.6 C)-98.5 F (36.9 C)] 98.5 F (36.9 C) (09/17 2035) Pulse Rate:  [56-79] 79 (09/17 2035) Resp:  [16-18] 16 (09/17 2035) BP: (121-136)/(74-81) 130/81 (09/17 2035) SpO2:  [99 %-100 %] 100 % (09/17 2035) Weight:  [64.5 kg] 64.5 kg (09/17 2035) Physical Exam: General: NAD, supine in bed  Cardiovascular: RRR, no murmurs rubs or gallops appreciated Respiratory: Lungs clear to auscultation, no wheezes, no crackles Abdomen: Soft, nontender nondistended Extremities: Spontaneous movement of all extremities, wrapping over right foot  Laboratory: Recent Labs  Lab 04/28/20 1141 04/29/20 0311 05/01/20 0537  WBC 4.3 5.5 5.7  HGB 10.8* 10.2* 10.8*  HCT 33.4* 30.9* 32.3*  PLT 103* 124* 141*   Recent Labs  Lab 05/01/20 0537 05/02/20 0409 05/03/20 0819  NA 136 138  137  K 4.1 4.2 4.1  CL 96* 101 101  CO2 30 30 26   BUN 23 21 26*  CREATININE 1.30* 1.18 1.31*  CALCIUM 8.9 9.2 9.0  GLUCOSE 110* 94 108*      Imaging/Diagnostic  Tests: No results found.   [name], DO 05/04/2020, 3:31 AM PGY-1, [place] Intern pager: [phone], text pages welcome

## 2020-05-04 NOTE — Progress Notes (Signed)
   05/04/20 0119  Level of Consciousness  Level of Consciousness Alert  MEWS COLOR  MEWS Score Color Green  Oxygen Therapy  O2 Device Room Air  Pain Assessment  Pain Scale 0-10  Pain Score 0  MEWS Score  MEWS Temp 0  MEWS Systolic 0  MEWS Pulse 0  MEWS RR 0  MEWS LOC 0  MEWS Score 0  Provider Notification  Provider Name/Title Lilland, DO  Date Provider Notified 05/04/20  Time Provider Notified 0106  Notification Type Call  Notification Reason Other (Comment) (Agitation and Restlessness)  Response See new orders  Date of Provider Response 05/04/20  Time of Provider Response 0107  Note  Observations Patient brought to the nurses' station for close supervision, pt is restless, difficult to redirect and a high fall risk.

## 2020-05-04 NOTE — NC FL2 (Addendum)
[place] LEVEL OF CARE SCREENING TOOL     IDENTIFICATION  Patient Name: [name] Birthdate: 06-18-1951 Sex: male Admission Date (Current Location): 04/23/2020  [facility] and [place] Number:  Herbalist and Address:  [facility], [place] [address] [place], [place] [address]      Provider Number: [id number]  Attending Physician Name and Address:  Kinnie Feil, MD  Relative Name and Phone Number:  [name] (wife)  [phone]    Current Level of Care: Hospital Recommended Level of Care: [place] Prior Approval Number:    Date Approved/Denied:   PASRR Number:    Discharge Plan: Other (Comment) (ALF)    Current Diagnoses: Patient Active Problem List   Diagnosis Date Noted  . Lisfranc dislocation, right, initial encounter   . Closed displaced fracture of fifth metatarsal bone of right foot   . History of hepatitis B 04/26/2020  . Chronic hepatitis ([place])   . Crush injury   . Dementia without behavioral disturbance ([place])   . Fever of unknown origin   . Fever 04/24/2020  . Atrial fibrillation with RVR ([place]) 04/24/2020  . Malnutrition of moderate degree 04/24/2020  . Acute metabolic encephalopathy 32/99/2426  . SIRS (systemic inflammatory response syndrome) ([place]) 04/23/2020  . Acute encephalopathy 04/23/2020  . GERD (gastroesophageal reflux disease) 05/03/2019  . Hypotension 08/02/2018  . Low back sprain, initial encounter 10/21/2017  . History of drug abuse ([place]) 10/18/2017  . Gingival erythema 09/23/2017  . Chronic pain of left knee 11/24/2016  . Tobacco abuse   . Diastolic heart failure ([place]) 02/05/2015  . Healthcare maintenance 05/28/2014  . Hyperlipidemia 03/10/2014  . Patellar tendonitis 11/16/2012  . [place] OCCLUSION 06/03/2010  . PARANOID SCHIZOPHRENIA, CHRONIC 01/17/2009  . HYPERTENSION, BENIGN ESSENTIAL 01/17/2009  . BPH (benign prostatic hyperplasia) 01/17/2009  . History of  hepatitis C 12/14/2008  . DEPRESSION 12/14/2008    Orientation RESPIRATION BLADDER Height & Weight     Place, Self  Normal Incontinent, External catheter Weight: 142 lb 3.2 oz (64.5 kg) Height:  5\' 9"  (175.3 cm)  BEHAVIORAL SYMPTOMS/MOOD NEUROLOGICAL BOWEL NUTRITION STATUS      Continent Diet (Regular)  AMBULATORY STATUS COMMUNICATION OF NEEDS Skin   Limited Assist Verbally Surgical wounds (Incision closed on leg)                       Personal Care Assistance Level of Assistance  Bathing, Dressing Bathing Assistance: Maximum assistance   Dressing Assistance: Maximum assistance     Functional Limitations Info  Sight, Hearing, Speech Sight Info: Adequate Hearing Info: Adequate Speech Info: Adequate    SPECIAL CARE FACTORS FREQUENCY   (Home Health)                    Contractures Contractures Info: Not present    Additional Factors Info  Code Status, Allergies Code Status Info: FULL Allergies Info: Penicillian, amoxicillian, ibprophen, lisinopril           Current Medications (05/04/2020):  This is the current hospital active medication list Current Facility-Administered Medications  Medication Dose Route Frequency Provider Last Rate Last Admin  . acetaminophen (TYLENOL) tablet 650 mg  650 mg Oral Q6H Gladys Damme, MD   650 mg at 05/04/20 1036  . amLODipine (NORVASC) tablet 10 mg  10 mg Oral Daily Persons, Bevely Palmer, PA   10 mg at 05/04/20 1036  . benztropine (COGENTIN) tablet 1 mg  1 mg Oral BID Persons, Bevely Palmer, [place]   1 mg at 05/04/20 1036  . docusate sodium (COLACE) capsule 100 mg  100 mg Oral BID Persons, Bevely Palmer, PA   100 mg at 05/04/20 1037  . enoxaparin (LOVENOX) injection 40 mg  40 mg Subcutaneous Q24H Gladys Damme, MD   40 mg at 05/03/20 2211  . feeding supplement (ENSURE ENLIVE) (ENSURE ENLIVE) liquid 237 mL  237 mL Oral BID BM Persons, Bevely Palmer, PA   237 mL at 05/04/20 1453  . fluPHENAZine decanoate (PROLIXIN) injection 25 mg  25 mg  Intramuscular Q14 Days Persons, Bevely Palmer, PA   25 mg at 04/25/20 1252  . folic acid (FOLVITE) tablet 1 mg  1 mg Oral Daily Persons, Bevely Palmer, PA   1 mg at 05/04/20 1036  . HYDROmorphone (DILAUDID) injection 0.5 mg  0.5 mg Intravenous Q4H PRN Persons, Bevely Palmer, PA      . hydrOXYzine (ATARAX/VISTARIL) tablet 50 mg  50 mg Oral QHS Lilland, Alana, DO      . metoCLOPramide (REGLAN) tablet 5-10 mg  5-10 mg Oral Q8H PRN Persons, Bevely Palmer, PA       Or  . metoCLOPramide (REGLAN) injection 5-10 mg  5-10 mg Intravenous Q8H PRN Persons, Bevely Palmer, PA      . multivitamin with minerals tablet 1 tablet  1 tablet Oral Daily Persons, Bevely Palmer, PA   1 tablet at 05/04/20 1037  . naloxone (NARCAN) injection 0.4 mg  0.4 mg Intravenous PRN Persons, Bevely Palmer, PA      . nicotine (NICODERM CQ - dosed in mg/24 hours) patch 21 mg  21 mg Transdermal Daily Persons, Bevely Palmer, PA   21 mg at 05/04/20 1037  . ondansetron (ZOFRAN) tablet 4 mg  4 mg Oral Q6H PRN Persons, Bevely Palmer, PA       Or  . ondansetron [facility]) injection 4 mg  4 mg Intravenous Q6H PRN Persons, Bevely Palmer, [place]      . oxyCODONE (Oxy IR/ROXICODONE) immediate release tablet 5-10 mg  5-10 mg Oral Q4H PRN Persons, Bevely Palmer, PA   10 mg at 05/03/20 2213  . polyethylene glycol (MIRALAX / GLYCOLAX) packet 17 g  17 g Oral BID Mullis, Kiersten P, DO   17 g at 05/04/20 1453  . sodium chloride flush (NS) 0.9 % injection 3 mL  3 mL Intravenous Q12H Persons, Bevely Palmer, PA   3 mL at 05/04/20 1039  . tamsulosin (FLOMAX) capsule 0.8 mg  0.8 mg Oral Daily Persons, Bevely Palmer, PA   0.8 mg at 05/04/20 1036  . thiamine tablet 100 mg  100 mg Oral Daily Persons, Bevely Palmer, PA   100 mg at 05/04/20 1037     Discharge Medications: Please see discharge summary for a list of discharge medications.  Relevant Imaging Results:  Relevant Lab Results:   Additional Information SSN: [id number]  MEDICATIONS: Medication List    STOP taking these medications   amLODipine 10  MG tablet Commonly known as: NORVASC   aspirin 81 MG tablet   atorvastatin 40 MG tablet Commonly known as: LIPITOR   famotidine 20 MG tablet Commonly known as: PEPCID   fluticasone 50 MCG/ACT nasal spray Commonly known as: FLONASE   hydrOXYzine 50 MG capsule Commonly known as: VISTARIL   lidocaine 5 % Commonly known as: Lidoderm   losartan 100 MG tablet Commonly known as: COZAAR   mometasone 50 MCG/ACT nasal spray Commonly known as: Nasonex   sodium chloride  0.65 % Soln nasal spray Commonly known as: OCEAN   traMADol 50 MG tablet Commonly known as: ULTRAM   triamcinolone cream 0.1 % Commonly known as: KENALOG     TAKE these medications   acetaminophen 325 MG tablet Commonly known as: TYLENOL Take 2 tablets (650 mg total) by mouth every 6 (six) hours as needed for mild pain, moderate pain or headache.   benztropine 1 MG tablet Commonly known as: COGENTIN Take 1 tablet (1 mg total) by mouth 2 (two) times daily. What changed: Another medication with the same name was removed. Continue taking this medication, and follow the directions you see here.   cetirizine 10 MG tablet Commonly known as: ZyrTEC Allergy Take 1 tablet (10 mg total) by mouth daily.   fluPHENAZine decanoate 25 MG/ML injection Commonly known as: PROLIXIN Inject 1 mL (25 mg total) into the muscle every 14 (fourteen) days. Last dose 4/62/70   folic acid 1 MG tablet Commonly known as: FOLVITE Take 1 tablet (1 mg total) by mouth daily.   multivitamin with minerals Tabs tablet Take 1 tablet by mouth daily.   nicotine 14 mg/24hr patch Commonly known as: NICODERM CQ - dosed in mg/24 hours Place 1 patch (14 mg total) onto the skin daily.   tamsulosin 0.4 MG Caps capsule Commonly known as: FLOMAX TAKE 2 CAPSULE BY MOUTH DAILY What changed: See the new instructions.   thiamine 100 MG tablet Take 1 tablet (100 mg total) by mouth daily.   vitamin C 500 MG tablet Commonly  known as: ASCORBIC ACID Take 500 mg by mouth daily.        [place], LCSWA

## 2020-05-04 NOTE — Plan of Care (Signed)
  Problem: Education: Goal: Knowledge of General Education information will improve Description Including pain rating scale, medication(s)/side effects and non-pharmacologic comfort measures Outcome: Progressing   

## 2020-05-04 NOTE — Social Work (Signed)
CSW soke with Ms. Pamella Pert [phone] with [facility] in reference to placement for this patient. Ms. Pamella Pert requested [facility] and room number be emailed to [email or web address] for patient and stated she would be by to visit patient on Monday to make a decision on whether placement would be possible. CSW emailed requested information.

## 2020-05-04 NOTE — Progress Notes (Signed)
Patient ID: [name], male   DOB: [birth year], 69 y.o.   MRN: [id number] Patient is status post internal fixation for Lisfranc injury crush injury to the metatarsals including the fifth metatarsal status post internal fixation for the Lisfranc joint and the fifth metatarsal.  Patient is confused this morning he was given his breakfast and eating well.

## 2020-05-05 DIAGNOSIS — I4891 Unspecified atrial fibrillation: Secondary | ICD-10-CM | POA: Diagnosis not present

## 2020-05-05 DIAGNOSIS — G9340 Encephalopathy, unspecified: Secondary | ICD-10-CM | POA: Diagnosis not present

## 2020-05-05 DIAGNOSIS — F200 Paranoid schizophrenia: Secondary | ICD-10-CM | POA: Diagnosis not present

## 2020-05-05 DIAGNOSIS — I10 Essential (primary) hypertension: Secondary | ICD-10-CM | POA: Diagnosis not present

## 2020-05-05 DIAGNOSIS — T6709XA Other heatstroke and sunstroke, initial encounter: Secondary | ICD-10-CM | POA: Diagnosis not present

## 2020-05-05 NOTE — Plan of Care (Signed)
  Problem: Safety: Goal: Ability to remain free from injury will improve Outcome: Progressing   

## 2020-05-05 NOTE — Progress Notes (Addendum)
Family Medicine Teaching Service Daily Progress Note Intern Pager: [phone]  Patient name: [name] Medical record number: [id number] Date of birth: 1951-04-25 Age: 69 y.o. Gender: male  Primary Care Provider: Danna Hefty, DO Consultants: Ortho Code Status: Full  Pt Overview and Major Events to Date:  Admitted 9/8 ORIF 9/14  Assessment and Plan:  [name] is a 69 yo man with PMH of chronic pain, schizophrenia, malingering, homelessness, depression, GERD, hepatitis C, HLD, HTN, tobacco abuse, CKD, and HFpEF.   Crush Injury  Multiple fractures of right foot Denies complaints at this time. No pain on examination. Internal fixation of the Lisfranc dislocation with stabilization of the base of the first metatarsal medial cuneiform and stabilization of the Lisfranc complex. Open reduction internal fixation base of the fifth metatarsal right foot.Will continue scheduled tylenol for pain control.. -Starting Tylenol 650 mg q6 for pain control. -PT/OT recs for SNF, plans to go to ALF per ASW notes   CKD stage IIIa, stable Cr has been stable. Creatinineat baseline.  -Encourage PO hydration    Schizophrenia Remains at mental baseline.Cooperative and friendly on exam. On vistaril at home 50 mg QHS, proloxin 25 mg inject q 14 days (last dose 9/9), benztropine 1 mg BID. -Continue benztropine -Prolixin q 2 weeks  -Holding vistaril, can restart if needed  -Needsoutpatient Psych follow upat discharge    Bacteremia Blood cultures growing Gram positive rods in 1/2 bottles. Likely contaminate.  -Continue tomonitor -see problem: aortic cusp below.    HTN, chronic  HFpEF, 60-65% EF BPwas 126/70.Will continue to monitor BP.Holdinglosartan 100 mg (last filled 03/02/20).  -ContinueAmlodipine10 mg daily -Continueto monitor BP -Holding losartan given upcoming surgery   Normocytic Anemia, chronic, stable  Baseline Hgb 11-12. YesterdayHgb is  10.8. -Continue to trend -Follow upoutpatient   HLD -Holding home atorvastatin 40 mg.    BPH Chronic and stable. On Flomax. -Continue home meds   Thickened aortic cusp Echo: LV 60-65%, aortic valve thickened non coronary cusp, consider TEE is suspicious for SBE high to r/o vegetation. Blood cultures growing Gram positive rods in anaerobic bottle only,no leukocytosis, aerobic bottle n ow NG3D.Only oneCulture positive for Corynebacterium Minutiiimum. Has no symptoms of infection. -Most likely contamination,nothing to do at the moment -Continue to monitor for any signs of infection  FEN/GI: Regular PPx: Lovenox  Disposition: med tele, medically stable for discharge to SNF  Prior to Admission Living Arrangement: unstable Anticipated Discharge Location: SNF Barriers to Discharge: SNF Placement Anticipated discharge in approximately 1-2 day(s).   Subjective:  Interviewed patient at bedside.  He reports no pain in his foot. He is happy and appears to be at baseline.  Objective: Temp:  [97.4 F (36.3 C)-98.2 F (36.8 C)] 98.2 F (36.8 C) (09/19 0455) Pulse Rate:  [58-78] 63 (09/19 0455) Resp:  [16-20] 18 (09/19 0455) BP: (115-169)/(58-79) 134/74 (09/19 0455) SpO2:  [97 %-100 %] 100 % (09/19 0455) Physical Exam:  Physical Exam Constitutional:      General: He is not in acute distress.    Appearance: Normal appearance. He is not ill-appearing, toxic-appearing or diaphoretic.  HENT:     Head: Normocephalic and atraumatic.  Cardiovascular:     Rate and Rhythm: Normal rate and regular rhythm.     Pulses: Normal pulses.          Radial pulses are 2+ on the right side and 2+ on the left side.       Dorsalis pedis pulses are 2+ on the right side and  2+ on the left side.     Heart sounds: Normal heart sounds, S1 normal and S2 normal.  Pulmonary:     Effort: Pulmonary effort is normal. No respiratory distress.     Breath sounds: Normal breath sounds. No  wheezing.  Musculoskeletal:     Right lower leg: No edema.     Left lower leg: No edema.  Neurological:     Mental Status: He is alert.      Laboratory: Recent Labs  Lab 04/28/20 1141 04/29/20 0311 05/01/20 0537  WBC 4.3 5.5 5.7  HGB 10.8* 10.2* 10.8*  HCT 33.4* 30.9* 32.3*  PLT 103* 124* 141*   Recent Labs  Lab 05/01/20 0537 05/02/20 0409 05/03/20 0819  NA 136 138 137  K 4.1 4.2 4.1  CL 96* 101 101  CO2 30 30 26   BUN 23 21 26*  CREATININE 1.30* 1.18 1.31*  CALCIUM 8.9 9.2 9.0  GLUCOSE 110* 94 108*      Imaging/Diagnostic Tests:   DG Foot 2 Views Right:Evaluation limited by patient positioning. Consider repeat radiographs with an oblique view. Acute, minimally displaced fractures involving the second through fourth metatarsals.Acute appearing avulsion fracture through the base of the fifth metatarsal. Acute, nondisplaced fracture through the lateral malleolus.Questionable nondisplaced fracture involving the dorsal anterior talus.   ECHOCARDIOGRAM COMPLETE: EF 60-65%. No wall motion abnormalities. All structures visualized were normal except-Thickened non coronary cusp consider TEE if suspicion for SBE high to r/o vegetation on non coronay cusp of AV. The aortic valve is tricuspid. Aortic valve regurgitation is mild. Mild to moderate aortic valve sclerosis/calcification is present, without any evidence of aortic stenosis   VAS [place] LOWER EXTREMITY VENOUS (DVT):RIGHT: - There is no evidence of deep vein thrombosis in the lower extremity. - No cystic structure found in the popliteal fossa. LEFT: - No evidence of common femoral vein obstruction.   US Abdomen Limited RUQ:Two small simple hepatic cysts are identified. The exam is otherwise negative.   Briant Cedar, MD 05/05/2020, 6:10 AM PGY-1, [place] Intern pager: [phone], text pages welcome

## 2020-05-06 ENCOUNTER — Ambulatory Visit (HOSPITAL_COMMUNITY): Payer: Medicare Other

## 2020-05-06 DIAGNOSIS — I10 Essential (primary) hypertension: Secondary | ICD-10-CM | POA: Diagnosis not present

## 2020-05-06 DIAGNOSIS — S93324A Dislocation of tarsometatarsal joint of right foot, initial encounter: Secondary | ICD-10-CM | POA: Diagnosis not present

## 2020-05-06 DIAGNOSIS — T148XXA Other injury of unspecified body region, initial encounter: Secondary | ICD-10-CM | POA: Diagnosis not present

## 2020-05-06 DIAGNOSIS — G9340 Encephalopathy, unspecified: Secondary | ICD-10-CM | POA: Diagnosis not present

## 2020-05-06 DIAGNOSIS — T6709XA Other heatstroke and sunstroke, initial encounter: Secondary | ICD-10-CM | POA: Diagnosis not present

## 2020-05-06 NOTE — Progress Notes (Signed)
Family Medicine Teaching Service Daily Progress Note Intern Pager: [phone]  Patient name: [name] Medical record number: [id number] Date of birth: [birth year] Age: 69 y.o. Gender: male  Primary Care Provider: Danna Hefty, DO Consultants: Ortho Code Status: Full  Pt Overview and Major Events to Date:  Admitted 9/8 ORIF 9/14  Assessment and Plan:  [name] is a 69 yo man with PMH of chronic pain, schizophrenia, malingering, homelessness, depression, GERD, hepatitis C, HLD, HTN, tobacco abuse, CKD, and HFpEF.   Crush Injury  Multiple fractures of right foot Denies complaints at this time. No pain on examination. Internal fixation of the Lisfranc dislocation with stabilization of the base of the first metatarsal medial cuneiform and stabilization of the Lisfranc complex. Open reduction internal fixation base of the fifth metatarsal right foot. Have had scheduled tylenol for pain control and he reports pain being well controlled. -Continuing Tylenol 650 mg q6 for pain control. -PT/OT recs for SNF, plans to go to ALF per ASW notes   CKD stage IIIa, stable Cr has been stable. Creatinineat baseline.  -Encourage PO hydration    Schizophrenia Remains at mental baseline.Cooperative and friendly on exam. On vistaril at home 50 mg QHS, proloxin 25 mg inject q 14 days (last dose 9/9), benztropine 1 mg BID. -Continue benztropine -Prolixin q 2 weeks  -Holding vistaril, can restart if needed  -Needsoutpatient Psych follow upat discharge    Bacteremia Blood cultures growing Gram positive rods in 1/2 bottles. Likely contaminate.  -Continue tomonitor -see problem: aortic cusp below.    HTN, chronic  HFpEF, 60-65% EF BPwas 121/66.Will continue to monitor BP.Holdinglosartan 100 mg (last filled 03/02/20).  -ContinueAmlodipine10 mg daily -Continueto monitor BP -Holding losartan given upcoming surgery   Normocytic Anemia, chronic, stable   Baseline Hgb 11-12.Has been around baseline. -Continue to monitor -Follow upoutpatient   HLD -Holding home atorvastatin 40 mg.    BPH Chronic and stable. On Flomax. -Continue home meds   Thickened aortic cusp Echo: LV 60-65%, aortic valve thickened non coronary cusp, consider TEE is suspicious for SBE high to r/o vegetation. Blood cultures growing Gram positive rods in anaerobic bottle only,no leukocytosis, aerobic bottle n ow NG3D.Only oneCulture positive for Corynebacterium Minutiiimum. Has no symptoms of infection. -Most likely contamination,nothing to do at the moment -Continue to monitor for any signs of infection  FEN/GI: Regular PPx: Lovenox  Disposition: med tele, medically stable for discharge to SNF  Prior to Admission Living Arrangement: Unstable Anticipated Discharge Location: SNF Barriers to Discharge: SNF placement Anticipated discharge in approximately 0-2 day(s).   Subjective:  Interviewed patient at bedside.  He reports that he is doing well today with no foot pain. He was very distracted looking for his cell phone which he could not find. Discussed that a person from [place] should be visiting him today for possible placement.  Objective: Temp:  [97.3 F (36.3 C)-99 F (37.2 C)] 98.7 F (37.1 C) (09/20 0504) Pulse Rate:  [64-80] 64 (09/20 0504) Resp:  [18] 18 (09/20 0504) BP: (123-139)/(59-83) 139/83 (09/20 0504) SpO2:  [99 %-100 %] 99 % (09/20 0504) Weight:  [64.6 kg] 64.6 kg (09/20 0506) Physical Exam:  Physical Exam Vitals and nursing note reviewed.  Constitutional:      General: He is not in acute distress.    Appearance: Normal appearance. He is not ill-appearing, toxic-appearing or diaphoretic.  HENT:     Head: Normocephalic and atraumatic.  Cardiovascular:     Rate and Rhythm: Normal rate and regular  rhythm.     Pulses: Normal pulses.          Radial pulses are 2+ on the right side and 2+ on the left side.        Dorsalis pedis pulses are 2+ on the right side and 2+ on the left side.     Heart sounds: Normal heart sounds, S1 normal and S2 normal. No murmur heard.   Pulmonary:     Effort: Pulmonary effort is normal. No respiratory distress.     Breath sounds: Normal breath sounds. No wheezing.  Musculoskeletal:     Right lower leg: No edema.     Left lower leg: No edema.  Neurological:     Mental Status: He is alert. Mental status is at baseline.      Laboratory: Recent Labs  Lab 05/01/20 0537  WBC 5.7  HGB 10.8*  HCT 32.3*  PLT 141*   Recent Labs  Lab 05/01/20 0537 05/02/20 0409 05/03/20 0819  NA 136 138 137  K 4.1 4.2 4.1  CL 96* 101 101  CO2 30 30 26   BUN 23 21 26*  CREATININE 1.30* 1.18 1.31*  CALCIUM 8.9 9.2 9.0  GLUCOSE 110* 94 108*      Imaging/Diagnostic Tests:   DG Foot 2 Views Right:Evaluation limited by patient positioning. Consider repeat radiographs with an oblique view. Acute, minimally displaced fractures involving the second through fourth metatarsals.Acute appearing avulsion fracture through the base of the fifth metatarsal. Acute, nondisplaced fracture through the lateral malleolus.Questionable nondisplaced fracture involving the dorsal anterior talus.   ECHOCARDIOGRAM COMPLETE: EF 60-65%. No wall motion abnormalities. All structures visualized were normal except-Thickened non coronary cusp consider TEE if suspicion for SBE high to r/o vegetation on non coronay cusp of AV. The aortic valve is tricuspid. Aortic valve regurgitation is mild. Mild to moderate aortic valve sclerosis/calcification is present, without any evidence of aortic stenosis   VAS [place] LOWER EXTREMITY VENOUS (DVT):RIGHT: - There is no evidence of deep vein thrombosis in the lower extremity. - No cystic structure found in the popliteal fossa. LEFT: - No evidence of common femoral vein obstruction.   US Abdomen Limited RUQ:Two small simple hepatic cysts are identified. The exam  is otherwise negative.   Briant Cedar, MD 05/06/2020, 5:58 AM PGY-1, [place] Intern pager: [phone], text pages welcome

## 2020-05-06 NOTE — Care Management Important Message (Signed)
Important Message  Patient Details  Name: [name] MRN: [id number] Date of Birth: September 20, 1950   Medicare Important Message Given:  Yes - Important Message mailed due to current National Emergency  Verbal consent obtained due to current National Emergency  Relationship to patient: Self Contact Name: Rishawn Walck Call Date: 05/06/20  Time: 1130 Phone: [phone] Outcome: No Answer/Busy Important Message mailed to: Patient address on file    Delorse Lek 05/06/2020, 11:30 AM

## 2020-05-06 NOTE — Progress Notes (Signed)
FPTS Interim Progress Note  Touched based with [name] regarding follow up given patient still present in hosptial.  Patient was evaluated by ortho on 9/18 and receiving continued daily care while admitted. Patient to follow up with Ortho 1 week after discharge.   Mina [place], DO 05/06/2020, 2:48 PM PGY-3, [place] Medicine Service pager [phone]

## 2020-05-06 NOTE — TOC Progression Note (Signed)
Transition of Care [facility]) - Progression Note    Patient Details  Name: [name] MRN: [id number] Date of Birth: 1950-10-02  Transition of Care [facility]) CM/SW [place], LCSW Phone Number: 05/06/2020, 10:23 AM  Clinical Narrative:   CSW spoke with Damien Fusi at [facility] and confirmed that she will come to the hospital today to assess patient.    Expected Discharge Plan: Assisted Living Barriers to Discharge: Continued Medical Work up  Expected Discharge Plan and Services Expected Discharge Plan: Assisted Living                                               Social Determinants of Health (SDOH) Interventions    Readmission Risk Interventions No flowsheet data found.

## 2020-05-07 ENCOUNTER — Encounter (HOSPITAL_COMMUNITY): Payer: Self-pay | Admitting: *Deleted

## 2020-05-07 DIAGNOSIS — G9340 Encephalopathy, unspecified: Secondary | ICD-10-CM | POA: Diagnosis not present

## 2020-05-07 DIAGNOSIS — I10 Essential (primary) hypertension: Secondary | ICD-10-CM | POA: Diagnosis not present

## 2020-05-07 DIAGNOSIS — F200 Paranoid schizophrenia: Secondary | ICD-10-CM | POA: Diagnosis not present

## 2020-05-07 DIAGNOSIS — T148XXA Other injury of unspecified body region, initial encounter: Secondary | ICD-10-CM | POA: Diagnosis not present

## 2020-05-07 DIAGNOSIS — T6709XA Other heatstroke and sunstroke, initial encounter: Secondary | ICD-10-CM | POA: Diagnosis not present

## 2020-05-07 NOTE — Progress Notes (Signed)
Patient was AAOX3 was not aware of time. Was calm and cooperative and followed commands. Skin was intact. Mucous membranes were pink and moist, no teeth present, wears glasses, no hearing aids. Cardio, S1 and S2 noted, normal rhythm and HR. Lung sounds clear, chest expansion equal and unlabored. Bowel sounds present. No BM today, peed multiple times. One assist when walking. Did not complain about pain. Pulses were moderate to strong, cap refill <3 seconds. Grips strong.

## 2020-05-07 NOTE — Plan of Care (Signed)
  Problem: Safety: Goal: Ability to remain free from injury will improve Outcome: Progressing   
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
## 2020-05-07 NOTE — Progress Notes (Signed)
Family Medicine Teaching Service Daily Progress Note Intern Pager: [phone]  Patient name: [name] Medical record number: [id number] Date of birth: 1951-08-13 Age: 69 y.o. Gender: male  Primary Care Provider: Danna Hefty, DO Consultants: Ortho Code Status: Full  Pt Overview and Major Events to Date:  Admitted 9/8 ORIF 9/14  Assessment and Plan:  [name] is a 69 yo man with PMH of chronic pain, schizophrenia, malingering, homelessness, depression, GERD, hepatitis C, HLD, HTN, tobacco abuse, CKD, and HFpEF.   Crush Injury  Multiple fractures of right foot Denies complaints at this time. No pain on examination. Internal fixation of the Lisfranc dislocation with stabilization of the base of the first metatarsal medial cuneiform and stabilization of the Lisfranc complex. Open reduction internal fixation base of the fifth metatarsal right foot. Have had scheduled tylenol for pain control and he reports pain being well controlled. -Continuing Tylenol 650 mg q6 for pain control. -PT/OT recs for SNF, plans to go to ALF per ASW notes   CKD stage IIIa, stable Cr has been stable. Creatinineat baseline.  -Encourage PO hydration    Schizophrenia Remains at mental baseline.Cooperative and friendly on exam. On vistaril at home 50 mg QHS, proloxin 25 mg inject q 14 days (last dose 9/9), benztropine 1 mg BID. -Continue benztropine -Prolixin q 2 weeks  -Holding vistaril, can restart if needed  -Needsoutpatient Psych follow upat discharge    Bacteremia Blood cultures growing Gram positive rods in 1/2 bottles. Likely contaminate.  -Continue tomonitor -see problem: aortic cusp below.    HTN, chronic  HFpEF, 60-65% EF BPwas 121/66.Will continue to monitor BP.Holdinglosartan 100 mg (last filled 03/02/20).  -ContinueAmlodipine10 mg daily -Continueto monitor BP -Holding losartan given upcoming surgery   Normocytic Anemia, chronic, stable   Baseline Hgb 11-12.Has been around baseline. -Continue to monitor -Follow upoutpatient   HLD -Holding home atorvastatin 40 mg.    BPH Chronic and stable. On Flomax. -Continue home meds   Thickened aortic cusp Echo: LV 60-65%, aortic valve thickened non coronary cusp, consider TEE is suspicious for SBE high to r/o vegetation. Blood cultures growing Gram positive rods in anaerobic bottle only,no leukocytosis, aerobic bottle n ow NG3D.Only oneCulture positive for Corynebacterium Minutiiimum. Has no symptoms of infection. -Most likely contamination,nothing to do at the moment -Continue to monitor for any signs of infection  FEN/GI: Regular PPx: Lovenox  Disposition: med tele, medically stable for discharge to SNF  Prior to Admission Living Arrangement: Unstable Anticipated Discharge Location: SNF Barriers to Discharge: SNF placement Anticipated discharge in approximately 0-2 day(s).   Subjective:  Interviewed patient at bedside.  He reports having no pain in his leg. He is in a good mood. He has no complaints at this time.  Objective: Temp:  [98 F (36.7 C)-98.6 F (37 C)] 98 F (36.7 C) (09/21 0515) Pulse Rate:  [61-71] 70 (09/21 0515) Resp:  [18-20] 18 (09/21 0515) BP: (120-130)/(66-76) 130/76 (09/21 0515) SpO2:  [98 %-100 %] 98 % (09/21 0515) Physical Exam:  Physical Exam Vitals and nursing note reviewed.  Constitutional:      General: He is not in acute distress.    Appearance: Normal appearance. He is not ill-appearing, toxic-appearing or diaphoretic.  HENT:     Head: Normocephalic and atraumatic.  Cardiovascular:     Rate and Rhythm: Normal rate and regular rhythm.     Pulses: Normal pulses.          Radial pulses are 2+ on the right side and 2+ on  the left side.       Dorsalis pedis pulses are 2+ on the left side.     Heart sounds: Normal heart sounds, S1 normal and S2 normal. No murmur heard.   Pulmonary:     Effort: Pulmonary effort is  normal. No respiratory distress.     Breath sounds: Normal breath sounds. No wheezing.  Abdominal:     General: There is no distension.     Tenderness: There is no abdominal tenderness.  Musculoskeletal:     Right lower leg: No edema.     Left lower leg: No edema.  Neurological:     Mental Status: He is alert.      Laboratory: Recent Labs  Lab 05/01/20 0537  WBC 5.7  HGB 10.8*  HCT 32.3*  PLT 141*   Recent Labs  Lab 05/01/20 0537 05/02/20 0409 05/03/20 0819  NA 136 138 137  K 4.1 4.2 4.1  CL 96* 101 101  CO2 30 30 26   BUN 23 21 26*  CREATININE 1.30* 1.18 1.31*  CALCIUM 8.9 9.2 9.0  GLUCOSE 110* 94 108*      Imaging/Diagnostic Tests:   DG Foot 2 Views Right:Evaluation limited by patient positioning. Consider repeat radiographs with an oblique view. Acute, minimally displaced fractures involving the second through fourth metatarsals.Acute appearing avulsion fracture through the base of the fifth metatarsal. Acute, nondisplaced fracture through the lateral malleolus.Questionable nondisplaced fracture involving the dorsal anterior talus.   ECHOCARDIOGRAM COMPLETE: EF 60-65%. No wall motion abnormalities. All structures visualized were normal except-Thickened non coronary cusp consider TEE if suspicion for SBE high to r/o vegetation on non coronay cusp of AV. The aortic valve is tricuspid. Aortic valve regurgitation is mild. Mild to moderate aortic valve sclerosis/calcification is present, without any evidence of aortic stenosis   VAS [place] LOWER EXTREMITY VENOUS (DVT):RIGHT: - There is no evidence of deep vein thrombosis in the lower extremity. - No cystic structure found in the popliteal fossa. LEFT: - No evidence of common femoral vein obstruction.   US Abdomen Limited RUQ:Two small simple hepatic cysts are identified. The exam is otherwise negative.   Briant Cedar, MD 05/07/2020, 7:35 AM PGY-1, [place] Intern pager:  [phone], text pages welcome

## 2020-05-07 NOTE — TOC Progression Note (Signed)
Transition of Care [facility]) - Progression Note    Patient Details  Name: COLBE VIVIANO MRN: [id number] Date of Birth: 11/20/1950  Transition of Care [facility]) CM/SW Contact  Sharlet Salina Mila Homer, LCSW Phone Number: 05/07/2020, 2:59 PM  Clinical Narrative:  CSW talked with [facility]. [facility] [phone]) regarding patient. Ms. Pamella Pert indicated that they can accept patient once his SS payments are suspended. She added that Aaron Edelman [facility]) can assist with patient's social security check being suspended, and the payments must be suspended so that family doesn't have access to his income. She added that Aaron Edelman has to do this.   Call made to Aaron Edelman [phone]) regarding patient going to [facility] and my conversation with Ms. Foreman. Aaron Edelman indicated that he will visit with patient and talk with him.     Expected Discharge Plan: Assisted Living Barriers to Discharge: Continued Medical Work up  Expected Discharge Plan and Services Expected Discharge Plan: Assisted Living                                             Social Determinants of Health (SDOH) Interventions  Patient needs housing and can d/c to [facility] once his SSA is suspended.   Readmission Risk Interventions No flowsheet data found.

## 2020-05-08 ENCOUNTER — Telehealth (HOSPITAL_COMMUNITY): Payer: Self-pay | Admitting: *Deleted

## 2020-05-08 ENCOUNTER — Ambulatory Visit: Payer: Medicare Other | Admitting: Podiatry

## 2020-05-08 DIAGNOSIS — G9340 Encephalopathy, unspecified: Secondary | ICD-10-CM | POA: Diagnosis not present

## 2020-05-08 DIAGNOSIS — T6709XA Other heatstroke and sunstroke, initial encounter: Secondary | ICD-10-CM | POA: Diagnosis not present

## 2020-05-08 DIAGNOSIS — T148XXA Other injury of unspecified body region, initial encounter: Secondary | ICD-10-CM | POA: Diagnosis not present

## 2020-05-08 MED ORDER — ACETAMINOPHEN 325 MG PO TABS
650.0000 mg | ORAL_TABLET | Freq: Four times a day (QID) | ORAL | Status: DC | PRN
  Administered 2020-05-08: 650 mg via ORAL
  Filled 2020-05-08: qty 2

## 2020-05-08 MED ORDER — NICOTINE 14 MG/24HR TD PT24
14.0000 mg | MEDICATED_PATCH | Freq: Every day | TRANSDERMAL | Status: DC
  Administered 2020-05-09: 14 mg via TRANSDERMAL
  Filled 2020-05-08: qty 1

## 2020-05-08 NOTE — Progress Notes (Signed)
Nutrition Follow-up  DOCUMENTATION CODES:   Non-severe (moderate) malnutrition in context of social or environmental circumstances  INTERVENTION:  Continue MVI daily  Continue Ensure Enlive po BID, each supplement provides 350 kcal and 20 grams of protein   NUTRITION DIAGNOSIS:   Moderate Malnutrition related to social / environmental circumstances as evidenced by percent weight loss, mild fat depletion, mild muscle depletion, moderate muscle depletion.  Ongoing  GOAL:   Patient will meet greater than or equal to 90% of their needs  Progressing  MONITOR:   PO intake, Supplement acceptance, Labs, Weight trends, Skin, I & O's  REASON FOR ASSESSMENT:   Consult Assessment of nutrition requirement/status  ASSESSMENT:   69 y.o. male with medical history significant of chronic pain, schizophrenia, malingering, homelessness, depression, GERD, hepatitis C, HLD, HTNTobacco abuse, chronic diastolic heart failure  Admitted for fever of unknown origin and Acute encephalopathy and found to have multiple fractures in R leg/foot  9/14 s/p ORIF R foot  Per MD, pt stable for d/c. Pt has been accepted to SNF; working on suspended SSA check before he can d/c.   Pt continues to have a good appetite. He has been eating 75-100% of all meals. Pt also continues to consume Ensure Enlive well per RN (receives BID).   Labs reviewed. Medications: Colace, Folvite, MVI, Miralax, Thiamine  Diet Order:   Diet Order            Diet regular Room service appropriate? Yes; Fluid consistency: Thin  Diet effective now                 EDUCATION NEEDS:   Not appropriate for education at this time  Skin:  Skin Assessment: Skin Integrity Issues: Skin Integrity Issues:: Incisions Incisions: leg  Last BM:  9/14  Height:   Ht Readings from Last 1 Encounters:  04/30/20 5\' 9"  (1.753 m)    Weight:   Wt Readings from Last 1 Encounters:  05/06/20 64.6 kg    Ideal Body Weight:  72.7  kg  BMI:  Body mass index is 21.03 kg/m.  Estimated Nutritional Needs:   Kcal:  2000-2200  Protein:  110-125 grams  Fluid:  > 2 L    Larkin Ina, MS, RD, LDN RD pager number and weekend/on-call pager number located in [place].

## 2020-05-08 NOTE — Progress Notes (Signed)
Family Medicine Teaching Service Daily Progress Note Intern Pager: [phone]  Patient name: [name] Medical record number: [id number] Date of birth: 1950-08-27 Age: 69 y.o. Gender: male  Primary Care Provider: Danna Hefty, DO Consultants: Ortho Code Status: Full  Pt Overview and Major Events to Date:  Admitted 9/8 ORIF 9/14  Assessment and Plan:  [name] is a 69 yo man with PMH of chronic pain, schizophrenia, malingering, homelessness, depression, GERD, hepatitis C, HLD, HTN, tobacco abuse, CKD, and HFpEF.   Crush Injury  Multiple fractures of right foot Denies complaints at this time. No pain on examination. Internal fixation of the Lisfranc dislocation with stabilization of the base of the first metatarsal medial cuneiform and stabilization of the Lisfranc complex. Open reduction internal fixation base of the fifth metatarsal right foot.Have hadscheduled tylenol for pain controland he reports pain being well controlled. -ContinuingTylenol 650 mg q6 for pain control. -PT/OT recs for SNF, plans to go to ALF per ASW notes   CKD stage IIIa, stable Cr has been stable. Creatinineat baseline.  -Encourage PO hydration    Schizophrenia Remains at mental baseline.Cooperative and friendly on exam. On vistaril at home 50 mg QHS, proloxin 25 mg inject q 14 days (last dose 9/9), benztropine 1 mg BID. -Continue benztropine -Prolixin q 2 weeks  -Holding vistaril, can restart if needed  -Needsoutpatient Psych follow upat discharge    Bacteremia Blood cultures growing Gram positive rods in 1/2 bottles. Likely contaminate.  -Continue tomonitor -see problem: aortic cusp below.    HTN, chronic  HFpEF, 60-65% EF BPwas 121/66.Will continue to monitor BP.Holdinglosartan 100 mg (last filled 03/02/20).  -ContinueAmlodipine10 mg daily -Continueto monitor BP -Holding losartan given upcoming surgery   Normocytic Anemia, chronic, stable   Baseline Hgb 11-12.Has been around baseline. -Continue tomonitor -Follow upoutpatient   HLD -Holding home atorvastatin 40 mg.    BPH Chronic and stable. On Flomax. -Continue home meds   Thickened aortic cusp Echo: LV 60-65%, aortic valve thickened non coronary cusp, consider TEE is suspicious for SBE high to r/o vegetation. Blood cultures growing Gram positive rods in anaerobic bottle only,no leukocytosis, aerobic bottle n ow NG3D.Only oneCulture positive for Corey Minutiiimum. Has no symptoms of infection. -Most likely contamination,nothing to do at the moment -Continue to monitor for any signs of infection   FEN/GI: Regular PPx: Lovenox  Disposition: med tele, medically stable for discharge to SNF. [facility] has accepted patient, working on suspended SSA check before he can go  Prior to Admission Living Arrangement: Unstable Anticipated Discharge Location: SNF Barriers to Discharge: Suspension of SSA check Anticipated discharge in approximately 0-2 day(s).   Subjective:  Interviewed patient at bedside.   He is in a good mood. He reports no pain in his foot. He is very thankful for his care. Discussed that [name].  Objective: Temp:  [97.9 F (36.6 C)-99 F (37.2 C)] 98 F (36.7 C) (09/22 0454) Pulse Rate:  [61-79] 70 (09/22 0454) Resp:  [16-18] 17 (09/22 0454) BP: (119-139)/(57-83) 126/83 (09/22 0454) SpO2:  [96 %-100 %] 99 % (09/22 0454) Physical Exam:  Physical Exam Vitals and nursing note reviewed.  Constitutional:      General: He is not in acute distress.    Appearance: Normal appearance. He is not ill-appearing, toxic-appearing or diaphoretic.  HENT:     Head: Normocephalic and atraumatic.  Cardiovascular:     Rate and Rhythm: Normal rate and regular rhythm.     Pulses: Normal pulses.  Radial pulses are 2+ on the right side and 2+ on the left side.       Dorsalis pedis pulses are 2+ on the left  side.     Heart sounds: Normal heart sounds, S1 normal and S2 normal.  Pulmonary:     Effort: Pulmonary effort is normal. No respiratory distress.     Breath sounds: Normal breath sounds. No wheezing.  Abdominal:     General: There is no distension.     Tenderness: There is no abdominal tenderness.  Musculoskeletal:     Right lower leg: No edema.     Left lower leg: No edema.  Neurological:     Mental Status: He is alert. Mental status is at baseline.      Laboratory: No results for input(s): WBC, HGB, HCT, PLT in the last 168 hours. Recent Labs  Lab 05/02/20 0409 05/03/20 0819  NA 138 137  K 4.2 4.1  CL 101 101  CO2 30 26  BUN 21 26*  CREATININE 1.18 1.31*  CALCIUM 9.2 9.0  GLUCOSE 94 108*      Imaging/Diagnostic Tests:   DG Foot 2 Views Right:Evaluation limited by patient positioning. Consider repeat radiographs with an oblique view. Acute, minimally displaced fractures involving the second through fourth metatarsals.Acute appearing avulsion fracture through the base of the fifth metatarsal. Acute, nondisplaced fracture through the lateral malleolus.Questionable nondisplaced fracture involving the dorsal anterior talus.   ECHOCARDIOGRAM COMPLETE: EF 60-65%. No wall motion abnormalities. All structures visualized were normal except-Thickened non coronary cusp consider TEE if suspicion for SBE high to r/o vegetation on non coronay cusp of AV. The aortic valve is tricuspid. Aortic valve regurgitation is mild. Mild to moderate aortic valve sclerosis/calcification is present, without any evidence of aortic stenosis   VAS [place] LOWER EXTREMITY VENOUS (DVT):RIGHT: - There is no evidence of deep vein thrombosis in the lower extremity. - No cystic structure found in the popliteal fossa. LEFT: - No evidence of common femoral vein obstruction.   US Abdomen Limited RUQ:Two small simple hepatic cysts are identified. The exam is otherwise negative.   Briant Cedar, MD 05/08/2020, 6:02 AM PGY-1, [place] Intern pager: [phone], text pages welcome

## 2020-05-08 NOTE — Plan of Care (Signed)
  Problem: Safety: Goal: Ability to remain free from injury will improve Outcome: Progressing   
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
## 2020-05-08 NOTE — Progress Notes (Signed)
Patient was asleep untill 0900 after giving medications and doing an assessment pt stated "you are annoying me", seemed to be more agitated today compared to yesterday. While administering medications the nicotine patch from yesterday could not be located, so it was documented as removed and a new one has been placed.

## 2020-05-08 NOTE — Plan of Care (Signed)
  Problem: Nutrition: Goal: Adequate nutrition will be maintained Outcome: Progressing   

## 2020-05-08 NOTE — TOC Progression Note (Signed)
Transition of Care [facility]) - Progression Note    Patient Details  Name: [name] MRN: [id number] Date of Birth: 1951-02-18  Transition of Care [facility]) CM/SW Contact  Sharlet Salina Mila Homer, LCSW Phone Number: 05/08/2020, 5:02 PM  Clinical Narrative:  Patient initially set to discharge to [facility] today, however CSW advised that patient will need to take a psych medication Thursday morning. Talked with Ms. Earlean Polka [phone]), facility administrator and she is fine with patient discharging to their facility on Thursday, 9/23. She also indicated that the 9/7 COVID test is ok for patient's admission. MD updated.     Expected Discharge Plan: Assisted Living Barriers to Discharge: Continued Medical Work up  Expected Discharge Plan and Services Expected Discharge Plan: Assisted Living                                             Social Determinants of Health (SDOH) Interventions  No SDOH interventions needed at this time  Readmission Risk Interventions No flowsheet data found.

## 2020-05-08 NOTE — Progress Notes (Signed)
Pt is AAOx3, not aware of the year or current President. Pupils are slightly sluggish, membranes are pink and moist. Wife came to visit today and they were arguing loudly. Lung sounds are clear, heart S1 and S2 noted, abdominal sounds active, genitalia is intact, no incontinence, is non weightbearing but has gotten up several times today unassisted. Weak pulses in the lower extremities, complained of some ankle pain today. Overall, cooperative and calm. Skin is intact with no apparent problems.

## 2020-05-08 NOTE — Telephone Encounter (Signed)
Call to Mrs Pamella Pert at [facility] where he is moving into today. He is late on his shot due to being in the hospital for medical issues, fx toes and or foot. Coordinated with her to come by the SNF tomorrow to give his shot to him. Plan to be there about 130.
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
## 2020-05-09 ENCOUNTER — Telehealth (HOSPITAL_COMMUNITY): Payer: Self-pay | Admitting: *Deleted

## 2020-05-09 ENCOUNTER — Ambulatory Visit (HOSPITAL_COMMUNITY): Payer: Medicare Other

## 2020-05-09 DIAGNOSIS — F0390 Unspecified dementia without behavioral disturbance: Secondary | ICD-10-CM | POA: Diagnosis not present

## 2020-05-09 DIAGNOSIS — T6709XA Other heatstroke and sunstroke, initial encounter: Secondary | ICD-10-CM | POA: Diagnosis not present

## 2020-05-09 DIAGNOSIS — K739 Chronic hepatitis, unspecified: Secondary | ICD-10-CM | POA: Diagnosis not present

## 2020-05-09 DIAGNOSIS — R509 Fever, unspecified: Secondary | ICD-10-CM | POA: Diagnosis not present

## 2020-05-09 DIAGNOSIS — G9340 Encephalopathy, unspecified: Secondary | ICD-10-CM | POA: Diagnosis not present

## 2020-05-09 MED ORDER — ACETAMINOPHEN 325 MG PO TABS: 650.0000 mg | ORAL_TABLET | Freq: Four times a day (QID) | ORAL | 0 refills | Status: AC | PRN

## 2020-05-09 MED ORDER — FOLIC ACID 1 MG PO TABS
1.0000 mg | ORAL_TABLET | Freq: Every day | ORAL | 0 refills | Status: DC
Start: 2020-05-09 — End: 2022-06-16

## 2020-05-09 MED ORDER — NICOTINE 14 MG/24HR TD PT24: 14.0000 mg | MEDICATED_PATCH | Freq: Every day | TRANSDERMAL | 0 refills | Status: DC

## 2020-05-09 MED ORDER — THIAMINE HCL 100 MG PO TABS
100.0000 mg | ORAL_TABLET | Freq: Every day | ORAL | 0 refills | Status: DC
Start: 2020-05-09 — End: 2022-06-16

## 2020-05-09 MED ORDER — FLUPHENAZINE DECANOATE 25 MG/ML IJ SOLN
25.0000 mg | INTRAMUSCULAR | Status: DC
  Administered 2020-05-09: 25 mg via INTRAMUSCULAR
  Filled 2020-05-09: qty 1

## 2020-05-09 NOTE — TOC Transition Note (Addendum)
Transition of Care [facility]) - CM/SW Discharge Note *Discharged to [facility] ALF   Patient Details  Name: [name] MRN: [id number] Date of Birth: 29-Jul-1951  [facility] of Care [facility]) CM/SW Contact:  Corey Feil, [name] Phone Number: 05/09/2020, 4:31 PM   Clinical Narrative: Patient medically stable for discharge and going to [place] (ALF). Communicated throughout the day with administrator [name] [phone] or [phone]) regarding patient's discharge. Clinicals faxed to facility and MD asked to sign FL-2. Patient's wife was in the room a this afternoon and was advised regarding the facility and provided with the name, address, and phone number of ALF.  Wife will provide patient with clothing and shoes for his stay at the facility. *Signed FL-2 faxed to facility.   Final next level of care: Assisted Living [facility]) Barriers to Discharge: Barriers Resolved   Patient Goals and CMS Choice Patient states their goals for this hospitalization and ongoing recovery are:: Patient agreeable to d/c to an ALF facility CMS Medicare.gov Compare Post Acute Care list provided to:: Other (Comment Required) (Not provided as an ALF had to be located that would accept him) Choice offered to / list presented to : NA  Discharge Placement              Patient chooses bed at: Other - please specify in the comment section below: ([place] [facility]) Patient to be transferred to facility by: [facility] Name of family member notified: Wife [name] at the bedside Patient and family notified of of transfer: 05/09/20  Discharge Plan and Services                                     Social Determinants of Health (SDOH) Interventions  No SDOH interventions needed at discharge.   Readmission Risk Interventions No flowsheet data found.

## 2020-05-09 NOTE — Care Management Important Message (Signed)
Important Message  Patient Details  Name: [name] MRN: [id number] Date of Birth: 1950-12-16   Medicare Important Message Given:  Yes - Important Message mailed due to current National Emergency  Verbal consent obtained due to current National Emergency  Relationship to patient: Self Contact Name: [name] Call Date: 05/09/20  Time: 0258 Phone: [phone] Outcome: No Answer/Busy Important Message mailed to: Patient address on file    Delorse Lek 05/09/2020, 10:43 AM

## 2020-05-09 NOTE — Telephone Encounter (Signed)
Per hospital and Mike Craze. Peer support had expected patient to be discharged to [facility] yesterday. Writer spoke with Ms Pamella Pert owner of the SNF yesterday about coming out today to give him his shot which is two weeks overdue. When Probation officer and peer arrived he had not yet arrived but was expected at any time to be discharged from hospital, told he was waiting on hospital transport. Staff person to Microbiologist when he does arrive at [facility] so I can come back by with his shot either tonight or tomorrow.

## 2020-05-09 NOTE — Progress Notes (Addendum)
Family Medicine Teaching Service Daily Progress Note Intern Pager: [phone]  Patient name: [name] Medical record number: [id number] Date of birth: 1950/10/05 Age: 69 y.o. Gender: male  Primary Care Provider: Danna Hefty, DO Consultants: Ortho Code Status: Full  Pt Overview and Major Events to Date:  Admitted 9/8 ORIF 9/14  Assessment and Plan:  Bijan Ridgley is a 69 yo man with PMH of chronic pain, schizophrenia, malingering, homelessness, depression, GERD, hepatitis C, HLD, HTN, tobacco abuse, CKD, and HFpEF.   Crush Injury  Multiple fractures of right foot Denies complaints at this time. No pain on examination. Internal fixation of the Lisfranc dislocation with stabilization of the base of the first metatarsal medial cuneiform and stabilization of the Lisfranc complex. Open reduction internal fixation base of the fifth metatarsal right foot.Have hadscheduled tylenol for pain controland he reports pain being well controlled. -ContinuingTylenol 650 mg q6 for pain control. -PT/OT recs for SNF, plans to go to ALF per ASW notes   CKD stage IIIa, stable Cr has been stable. Creatinineat baseline.  -Encourage PO hydration    Schizophrenia Remains at mental baseline.Cooperative and friendly on exam. On vistaril at home 50 mg QHS, proloxin 25 mg inject q 14 days (last dose 9/9), benztropine 1 mg BID. Will receive his Prolixin today before discharge. -Continue benztropine -Prolixin q 2 weeks  -Holding vistaril, can restart if needed  -Needsoutpatient Psych follow upat discharge    Bacteremia Blood cultures growing Gram positive rods in 1/2 bottles. Likely contaminate.  -Continue tomonitor -see problem: aortic cusp below.    HTN, chronic  HFpEF, 60-65% EF BPwas 121/66.Will continue to monitor BP.Holdinglosartan 100 mg (last filled 03/02/20).  -ContinueAmlodipine10 mg daily -Continueto monitor BP -Holding losartan given upcoming  surgery   Normocytic Anemia, chronic, stable  Baseline Hgb 11-12.Has been around baseline. -Continue tomonitor -Follow upoutpatient   HLD -Holding home atorvastatin 40 mg.    BPH Chronic and stable. On Flomax. -Continue home meds   Thickened aortic cusp Echo: LV 60-65%, aortic valve thickened non coronary cusp, consider TEE is suspicious for SBE high to r/o vegetation. Blood cultures growing Gram positive rods in anaerobic bottle only,no leukocytosis, aerobic bottle n ow NG3D.Only oneCulture positive for Corynebacterium Minutiiimum. Has no symptoms of infection. -Most likely contamination,nothing to do at the moment -Continue to monitor for any signs of infection   FEN/GI: Regular PPx: Lovenox  Disposition: med tele, medically stable for discharge to ALF. [facility] has accepted patient and SSA check has been suspended  Prior to Admission Living Arrangement: Unstable Anticipated Discharge Location: ALF Barriers to Discharge: None Anticipated discharge today.   Subjective:  Interviewed patient at bedside.  He was very excited to hear that he would be discharged today. He reports no pain and is feeling good.  Objective: Temp:  [98.2 F (36.8 C)] 98.2 F (36.8 C) (09/22 2036) Pulse Rate:  [64-78] 78 (09/22 2036) Resp:  [16-19] 19 (09/22 2036) BP: (136-150)/(74-89) 150/89 (09/22 2036) SpO2:  [99 %-100 %] 100 % (09/22 2036) Physical Exam:  Physical Exam Vitals and nursing note reviewed.  Constitutional:      General: He is not in acute distress.    Appearance: Normal appearance. He is not ill-appearing, toxic-appearing or diaphoretic.  HENT:     Head: Normocephalic and atraumatic.  Cardiovascular:     Rate and Rhythm: Normal rate and regular rhythm.     Pulses: Normal pulses.          Radial pulses are 2+ on  the right side and 2+ on the left side.       Dorsalis pedis pulses are 2+ on the left side.     Heart sounds: Normal heart sounds, S1  normal and S2 normal. No murmur heard.   Pulmonary:     Effort: Pulmonary effort is normal. No respiratory distress.     Breath sounds: Normal breath sounds. No wheezing.  Abdominal:     General: There is no distension.     Tenderness: There is no abdominal tenderness.  Musculoskeletal:     Right lower leg: No edema.     Left lower leg: No edema.  Neurological:     Mental Status: He is alert. Mental status is at baseline.      Laboratory: No results for input(s): WBC, HGB, HCT, PLT in the last 168 hours. Recent Labs  Lab 05/03/20 0819  NA 137  K 4.1  CL 101  CO2 26  BUN 26*  CREATININE 1.31*  CALCIUM 9.0  GLUCOSE 108*      Imaging/Diagnostic Tests:  DG Foot 2 Views Right:Evaluation limited by patient positioning. Consider repeat radiographs with an oblique view. Acute, minimally displaced fractures involving the second through fourth metatarsals.Acute appearing avulsion fracture through the base of the fifth metatarsal. Acute, nondisplaced fracture through the lateral malleolus.Questionable nondisplaced fracture involving the dorsal anterior talus.   ECHOCARDIOGRAM COMPLETE: EF 60-65%. No wall motion abnormalities. All structures visualized were normal except-Thickened non coronary cusp consider TEE if suspicion for SBE high to r/o vegetation on non coronay cusp of AV. The aortic valve is tricuspid. Aortic valve regurgitation is mild. Mild to moderate aortic valve sclerosis/calcification is present, without any evidence of aortic stenosis   VAS [place] LOWER EXTREMITY VENOUS (DVT):RIGHT: - There is no evidence of deep vein thrombosis in the lower extremity. - No cystic structure found in the popliteal fossa. LEFT: - No evidence of common femoral vein obstruction.   US Abdomen Limited RUQ:Two small simple hepatic cysts are identified. The exam is otherwise negative.   Briant Cedar, MD 05/09/2020, 5:56 AM PGY-1, [place] Intern  pager: [phone], text pages welcome
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
## 2020-05-09 NOTE — Progress Notes (Addendum)
Called report to [facility]. Spoke to [place] AVS reviewed with pt and wife. All questions answered. IV removed, skin CDI and pt has orthoboot on right leg. Pt denies any pain at this time. Pt gave cellphone and wallet top wife. Taken out by wheelchair and DC via [employer].

## 2020-05-10 ENCOUNTER — Ambulatory Visit (HOSPITAL_COMMUNITY): Payer: Medicare Other | Admitting: *Deleted

## 2020-05-10 DIAGNOSIS — F200 Paranoid schizophrenia: Secondary | ICD-10-CM

## 2020-05-11 ENCOUNTER — Emergency Department (HOSPITAL_COMMUNITY)
Admission: EM | Admit: 2020-05-11 | Discharge: 2020-05-11 | Discharge status: Against Medical Advice | Payer: Medicare Other

## 2020-05-12 ENCOUNTER — Emergency Department (HOSPITAL_COMMUNITY): Payer: Medicare Other

## 2020-05-12 ENCOUNTER — Other Ambulatory Visit: Payer: Self-pay

## 2020-05-12 ENCOUNTER — Inpatient Hospital Stay (HOSPITAL_COMMUNITY)
Admission: EM | Admit: 2020-05-12 | Discharge: 2020-05-16 | DRG: 683 | Disposition: A | Discharge status: Skilled Nursing Facility | Payer: Medicare Other | Source: Skilled Nursing Facility | Attending: Family Medicine | Admitting: Family Medicine

## 2020-05-12 ENCOUNTER — Encounter (HOSPITAL_COMMUNITY): Payer: Self-pay | Admitting: Emergency Medicine

## 2020-05-12 DIAGNOSIS — Z888 Allergy status to other drugs, medicaments and biological substances status: Secondary | ICD-10-CM

## 2020-05-12 DIAGNOSIS — F329 Major depressive disorder, single episode, unspecified: Secondary | ICD-10-CM | POA: Diagnosis present

## 2020-05-12 DIAGNOSIS — E785 Hyperlipidemia, unspecified: Secondary | ICD-10-CM | POA: Diagnosis not present

## 2020-05-12 DIAGNOSIS — F17210 Nicotine dependence, cigarettes, uncomplicated: Secondary | ICD-10-CM | POA: Diagnosis present

## 2020-05-12 DIAGNOSIS — Z880 Allergy status to penicillin: Secondary | ICD-10-CM | POA: Diagnosis not present

## 2020-05-12 DIAGNOSIS — G8929 Other chronic pain: Secondary | ICD-10-CM | POA: Diagnosis present

## 2020-05-12 DIAGNOSIS — I5030 Unspecified diastolic (congestive) heart failure: Secondary | ICD-10-CM | POA: Diagnosis present

## 2020-05-12 DIAGNOSIS — N400 Enlarged prostate without lower urinary tract symptoms: Secondary | ICD-10-CM | POA: Diagnosis present

## 2020-05-12 DIAGNOSIS — I130 Hypertensive heart and chronic kidney disease with heart failure and stage 1 through stage 4 chronic kidney disease, or unspecified chronic kidney disease: Secondary | ICD-10-CM | POA: Diagnosis not present

## 2020-05-12 DIAGNOSIS — Z20822 Contact with and (suspected) exposure to covid-19: Secondary | ICD-10-CM | POA: Diagnosis present

## 2020-05-12 DIAGNOSIS — M79671 Pain in right foot: Secondary | ICD-10-CM

## 2020-05-12 DIAGNOSIS — Z79899 Other long term (current) drug therapy: Secondary | ICD-10-CM | POA: Diagnosis not present

## 2020-05-12 DIAGNOSIS — F0280 Dementia in other diseases classified elsewhere without behavioral disturbance: Secondary | ICD-10-CM

## 2020-05-12 DIAGNOSIS — F200 Paranoid schizophrenia: Secondary | ICD-10-CM | POA: Diagnosis not present

## 2020-05-12 DIAGNOSIS — X58XXXA Exposure to other specified factors, initial encounter: Secondary | ICD-10-CM | POA: Diagnosis not present

## 2020-05-12 DIAGNOSIS — N179 Acute kidney failure, unspecified: Secondary | ICD-10-CM

## 2020-05-12 DIAGNOSIS — R401 Stupor: Secondary | ICD-10-CM | POA: Diagnosis present

## 2020-05-12 DIAGNOSIS — K739 Chronic hepatitis, unspecified: Secondary | ICD-10-CM | POA: Diagnosis not present

## 2020-05-12 DIAGNOSIS — N1831 Chronic kidney disease, stage 3a: Secondary | ICD-10-CM | POA: Diagnosis present

## 2020-05-12 DIAGNOSIS — M25562 Pain in left knee: Secondary | ICD-10-CM | POA: Diagnosis not present

## 2020-05-12 DIAGNOSIS — K219 Gastro-esophageal reflux disease without esophagitis: Secondary | ICD-10-CM | POA: Diagnosis present

## 2020-05-12 DIAGNOSIS — S93324A Dislocation of tarsometatarsal joint of right foot, initial encounter: Secondary | ICD-10-CM

## 2020-05-12 DIAGNOSIS — S92351A Displaced fracture of fifth metatarsal bone, right foot, initial encounter for closed fracture: Secondary | ICD-10-CM | POA: Diagnosis present

## 2020-05-12 DIAGNOSIS — Z881 Allergy status to other antibiotic agents status: Secondary | ICD-10-CM

## 2020-05-12 DIAGNOSIS — Z833 Family history of diabetes mellitus: Secondary | ICD-10-CM | POA: Diagnosis not present

## 2020-05-12 DIAGNOSIS — E860 Dehydration: Secondary | ICD-10-CM | POA: Diagnosis not present

## 2020-05-12 DIAGNOSIS — F0390 Unspecified dementia without behavioral disturbance: Secondary | ICD-10-CM | POA: Diagnosis present

## 2020-05-12 DIAGNOSIS — D649 Anemia, unspecified: Secondary | ICD-10-CM | POA: Diagnosis present

## 2020-05-12 DIAGNOSIS — Z8249 Family history of ischemic heart disease and other diseases of the circulatory system: Secondary | ICD-10-CM

## 2020-05-12 DIAGNOSIS — S92351D Displaced fracture of fifth metatarsal bone, right foot, subsequent encounter for fracture with routine healing: Secondary | ICD-10-CM

## 2020-05-12 DIAGNOSIS — E7800 Pure hypercholesterolemia, unspecified: Secondary | ICD-10-CM | POA: Diagnosis present

## 2020-05-12 HISTORY — DX: Acute kidney failure, unspecified: N17.9

## 2020-05-12 LAB — URINALYSIS, ROUTINE W REFLEX MICROSCOPIC
Bilirubin Urine: NEGATIVE
Glucose, UA: NEGATIVE mg/dL
Hgb urine dipstick: NEGATIVE
Ketones, ur: NEGATIVE mg/dL
Leukocytes,Ua: NEGATIVE
Nitrite: NEGATIVE
Protein, ur: NEGATIVE mg/dL
Specific Gravity, Urine: 1.015 (ref 1.005–1.030)
pH: 5 (ref 5.0–8.0)

## 2020-05-12 LAB — CBC
HCT: 33.4 % — ABNORMAL LOW (ref 39.0–52.0)
Hemoglobin: 10.7 g/dL — ABNORMAL LOW (ref 13.0–17.0)
MCH: 29.5 pg (ref 26.0–34.0)
MCHC: 32 g/dL (ref 30.0–36.0)
MCV: 92 fL (ref 80.0–100.0)
Platelets: 214 10*3/uL (ref 150–400)
RBC: 3.63 MIL/uL — ABNORMAL LOW (ref 4.22–5.81)
RDW: 14.6 % (ref 11.5–15.5)
WBC: 6.8 10*3/uL (ref 4.0–10.5)
nRBC: 0 % (ref 0.0–0.2)

## 2020-05-12 LAB — RAPID URINE DRUG SCREEN, HOSP PERFORMED
Amphetamines: NOT DETECTED
Barbiturates: NOT DETECTED
Benzodiazepines: NOT DETECTED
Cocaine: NOT DETECTED
Opiates: NOT DETECTED
Tetrahydrocannabinol: NOT DETECTED

## 2020-05-12 LAB — COMPREHENSIVE METABOLIC PANEL
ALT: 23 U/L (ref 0–44)
AST: 25 U/L (ref 15–41)
Albumin: 4 g/dL (ref 3.5–5.0)
Alkaline Phosphatase: 44 U/L (ref 38–126)
Anion gap: 11 (ref 5–15)
BUN: 37 mg/dL — ABNORMAL HIGH (ref 8–23)
CO2: 29 mmol/L (ref 22–32)
Calcium: 9.5 mg/dL (ref 8.9–10.3)
Chloride: 102 mmol/L (ref 98–111)
Creatinine, Ser: 2.38 mg/dL — ABNORMAL HIGH (ref 0.61–1.24)
GFR calc Af Amer: 31 mL/min — ABNORMAL LOW (ref >60)
GFR calc non Af Amer: 27 mL/min — ABNORMAL LOW (ref >60)
Glucose, Bld: 95 mg/dL (ref 70–99)
Potassium: 4.2 mmol/L (ref 3.5–5.1)
Sodium: 142 mmol/L (ref 135–145)
Total Bilirubin: 0.5 mg/dL (ref 0.3–1.2)
Total Protein: 7.1 g/dL (ref 6.5–8.1)

## 2020-05-12 LAB — RESPIRATORY PANEL BY RT PCR (FLU A&B, COVID)
Influenza A by PCR: NEGATIVE
Influenza B by PCR: NEGATIVE
SARS Coronavirus 2 by RT PCR: NEGATIVE

## 2020-05-12 LAB — AMMONIA: Ammonia: 26 umol/L (ref 9–35)

## 2020-05-12 LAB — ETHANOL: Alcohol, Ethyl (B): <10 mg/dL (ref <10)

## 2020-05-12 LAB — CBG MONITORING, ED: Glucose-Capillary: 111 mg/dL — ABNORMAL HIGH (ref 70–99)

## 2020-05-12 MED ORDER — SODIUM CHLORIDE 0.9% FLUSH: 3.0000 mL | INTRAVENOUS | Status: DC | PRN

## 2020-05-12 MED ORDER — AMLODIPINE BESYLATE 5 MG PO TABS
5.0000 mg | ORAL_TABLET | Freq: Every day | ORAL | Status: DC
  Administered 2020-05-12 – 2020-05-16 (×5): 5 mg via ORAL
  Filled 2020-05-12 (×5): qty 1

## 2020-05-12 MED ORDER — SODIUM CHLORIDE 0.9% FLUSH
3.0000 mL | Freq: Two times a day (BID) | INTRAVENOUS | Status: DC
  Administered 2020-05-12 – 2020-05-16 (×7): 3 mL via INTRAVENOUS

## 2020-05-12 MED ORDER — ACETAMINOPHEN 650 MG RE SUPP: 650.0000 mg | Freq: Four times a day (QID) | RECTAL | Status: DC | PRN

## 2020-05-12 MED ORDER — SODIUM CHLORIDE 0.9 % IV SOLN
250.0000 mL | INTRAVENOUS | Status: DC | PRN
  Administered 2020-05-13: 250 mL via INTRAVENOUS

## 2020-05-12 MED ORDER — SODIUM CHLORIDE 0.9 % IV BOLUS
500.0000 mL | Freq: Once | INTRAVENOUS | Status: AC
  Administered 2020-05-12: 500 mL via INTRAVENOUS

## 2020-05-12 MED ORDER — ASCORBIC ACID 500 MG PO TABS
500.0000 mg | ORAL_TABLET | Freq: Every day | ORAL | Status: DC
  Administered 2020-05-13 – 2020-05-16 (×4): 500 mg via ORAL
  Filled 2020-05-12 (×4): qty 1

## 2020-05-12 MED ORDER — TAMSULOSIN HCL 0.4 MG PO CAPS
0.8000 mg | ORAL_CAPSULE | Freq: Every day | ORAL | Status: DC
  Administered 2020-05-13 – 2020-05-16 (×4): 0.8 mg via ORAL
  Filled 2020-05-12 (×4): qty 2

## 2020-05-12 MED ORDER — ADULT MULTIVITAMIN W/MINERALS CH
1.0000 | ORAL_TABLET | Freq: Every day | ORAL | Status: DC
  Administered 2020-05-13 – 2020-05-16 (×4): 1 via ORAL
  Filled 2020-05-12 (×4): qty 1

## 2020-05-12 MED ORDER — ACETAMINOPHEN 325 MG PO TABS
650.0000 mg | ORAL_TABLET | Freq: Four times a day (QID) | ORAL | Status: DC | PRN
  Administered 2020-05-16 (×2): 650 mg via ORAL
  Filled 2020-05-12 (×2): qty 2

## 2020-05-12 MED ORDER — THIAMINE HCL 100 MG PO TABS
100.0000 mg | ORAL_TABLET | Freq: Every day | ORAL | Status: DC
  Administered 2020-05-13 – 2020-05-16 (×4): 100 mg via ORAL
  Filled 2020-05-12 (×4): qty 1

## 2020-05-12 MED ORDER — BENZTROPINE MESYLATE 1 MG PO TABS
1.0000 mg | ORAL_TABLET | Freq: Two times a day (BID) | ORAL | Status: DC
  Administered 2020-05-12 – 2020-05-16 (×8): 1 mg via ORAL
  Filled 2020-05-12 (×9): qty 1

## 2020-05-12 MED ORDER — FOLIC ACID 1 MG PO TABS
1.0000 mg | ORAL_TABLET | Freq: Every day | ORAL | Status: DC
  Administered 2020-05-13 – 2020-05-16 (×4): 1 mg via ORAL
  Filled 2020-05-12 (×4): qty 1

## 2020-05-12 MED ORDER — NICOTINE 14 MG/24HR TD PT24
14.0000 mg | MEDICATED_PATCH | Freq: Every day | TRANSDERMAL | Status: DC
  Administered 2020-05-13 – 2020-05-16 (×4): 14 mg via TRANSDERMAL
  Filled 2020-05-12 (×4): qty 1

## 2020-05-12 MED ORDER — ENOXAPARIN SODIUM 30 MG/0.3ML ~~LOC~~ SOLN
30.0000 mg | SUBCUTANEOUS | Status: DC
  Administered 2020-05-12 – 2020-05-13 (×2): 30 mg via SUBCUTANEOUS
  Filled 2020-05-12 (×2): qty 0.3

## 2020-05-12 MED ORDER — SODIUM CHLORIDE 0.9 % IV SOLN: INTRAVENOUS | Status: AC

## 2020-05-12 NOTE — ED Provider Notes (Signed)
[facility] EMERGENCY DEPARTMENT Provider Note   CSN: [id number] Arrival date & time: 05/12/20  1012     History Chief Complaint  Patient presents with  . found on side of road    [name] is a 69 y.o. male.  The history is provided by the patient, the EMS personnel and medical records. No language interpreter was used.   [name] is a 69 y.o. male who presents to the Emergency Department complaining of found on side of road. Level V caveat due to altered mental status. Per EMS patient was found lying on the side of [address]. No reports of trauma. Patient denies any current complaints.    Past Medical History:  Diagnosis Date  . BPH (benign prostatic hyperplasia)   . Colon polyp 2010  . Depression   . GERD (gastroesophageal reflux disease)   . Hepatitis C    "caught it when I had a blood transfusion"  . High cholesterol   . History of blood transfusion    "when I was young"  . Hypertension   . Paranoid schizophrenia ([name])   . Prostate atrophy   . Retinal vein occlusion     Patient Active Problem List   Diagnosis Date Noted  . AKI (acute kidney injury) ([name]) 05/12/2020  . Lisfranc dislocation, right, initial encounter   . Closed displaced fracture of fifth metatarsal bone of right foot   . History of hepatitis B 04/26/2020  . Chronic hepatitis ([name])   . Crush injury   . Dementia without behavioral disturbance ([name])   . Fever of unknown origin   . Fever 04/24/2020  . Atrial fibrillation with RVR ([name]) 04/24/2020  . Malnutrition of moderate degree 04/24/2020  . Acute metabolic encephalopathy 07/37/1062  . SIRS (systemic inflammatory response syndrome) ([name]) 04/23/2020  . Acute encephalopathy 04/23/2020  . GERD (gastroesophageal reflux disease) 05/03/2019  . Hypotension 08/02/2018  . Low back sprain, initial encounter 10/21/2017  . History of drug abuse ([name]) 10/18/2017  . Gingival erythema 09/23/2017  . Chronic pain of left knee  11/24/2016  . Tobacco abuse   . Diastolic heart failure ([name]) 02/05/2015  . Healthcare maintenance 05/28/2014  . Hyperlipidemia 03/10/2014  . Patellar tendonitis 11/16/2012  . [place] OCCLUSION 06/03/2010  . PARANOID SCHIZOPHRENIA, CHRONIC 01/17/2009  . HYPERTENSION, BENIGN ESSENTIAL 01/17/2009  . BPH (benign prostatic hyperplasia) 01/17/2009  . History of hepatitis C 12/14/2008  . DEPRESSION 12/14/2008    Past Surgical History:  Procedure Laterality Date  . CIRCUMCISION  1959  . ORIF TOE FRACTURE Right 04/30/2020   Procedure: OPEN REDUCTION INTERNAL FIXATION (ORIF) RIGHT FOOT METATARSAL FRACTURES;  Surgeon: Newt Minion, MD;  Location: [name];  Service: Orthopedics;  Laterality: Right;  . TONSILLECTOMY         Family History  Problem Relation Age of Onset  . Hypertension Mother   . Diabetes Mother   . Stroke Mother     Social History   Tobacco Use  . Smoking status: Current Every Day Smoker    Packs/day: 0.50    Years: 48.00    Pack years: 24.00    Types: Cigarettes  . Smokeless tobacco: Never Used  Substance Use Topics  . Alcohol use: Yes  . Drug use: No    Home Medications Prior to Admission medications   Medication Sig Start Date End Date Taking? Authorizing Provider  acetaminophen (TYLENOL) 325 MG tablet Take 2 tablets (650 mg total) by mouth every 6 (  six) hours as needed for mild pain, moderate pain or headache. 05/09/20   Mullis, Kiersten P, DO  benztropine (COGENTIN) 1 MG tablet Take 1 tablet (1 mg total) by mouth 2 (two) times daily. 02/26/20   Salley Slaughter, NP  cetirizine (ZYRTEC ALLERGY) 10 MG tablet Take 1 tablet (10 mg total) by mouth daily. 02/01/20   [name], [name], PA-C  fluPHENAZine decanoate (PROLIXIN) 25 MG/ML injection Inject 1 mL (25 mg total) into the muscle every 14 (fourteen) days. Last dose 09/07/12 02/26/20   Salley Slaughter, NP  folic acid (FOLVITE) 1 MG tablet Take 1 tablet (1 mg total) by mouth daily. 05/09/20   Mullis,  Kiersten P, DO  Multiple Vitamin (MULTIVITAMIN WITH MINERALS) TABS tablet Take 1 tablet by mouth daily. 06/28/14   Bacigalupo, Dionne Bucy, MD  nicotine (NICODERM CQ - DOSED IN MG/24 HOURS) 14 mg/24hr patch Place 1 patch (14 mg total) onto the skin daily. 05/09/20   Mullis, Kiersten P, DO  tamsulosin (FLOMAX) 0.4 MG CAPS capsule TAKE 2 CAPSULE BY MOUTH DAILY Patient taking differently: Take 0.8 mg by mouth daily.  02/23/20   Mullis, Kiersten P, DO  thiamine 100 MG tablet Take 1 tablet (100 mg total) by mouth daily. 05/09/20   Mullis, Kiersten P, DO  vitamin C (ASCORBIC ACID) 500 MG tablet Take 500 mg by mouth daily.    [provider]  loratadine (CLARITIN) 10 MG tablet Take 1 tablet (10 mg total) by mouth daily. 12/28/19 02/05/20  [name], DO    Allergies    Lisinopril, Penicillins, Amoxicillin, and Ibuprofen  Review of Systems   Review of Systems  All other systems reviewed and are negative.   Physical Exam Updated Vital Signs BP (!) 145/83 (BP Location: Right Arm)   Pulse 74   Temp 98.7 F (37.1 C) (Oral)   Resp 16   Ht 5\' 9"  (1.753 m)   Wt 65 kg   SpO2 100%   BMI 21.16 kg/m   Physical Exam Vitals and nursing note reviewed.  Constitutional:      Appearance: He is well-developed.     Comments: Lethargic. Arouses to verbal stimuli  HENT:     Head: Normocephalic and atraumatic.  Cardiovascular:     Rate and Rhythm: Normal rate and regular rhythm.     Heart sounds: No murmur heard.   Pulmonary:     Effort: Pulmonary effort is normal. No respiratory distress.     Breath sounds: Normal breath sounds.  Abdominal:     Palpations: Abdomen is soft.     Tenderness: There is no abdominal tenderness. There is no guarding or rebound.  Musculoskeletal:        General: No tenderness.     Comments: Walking boot on the right foot.  Skin:    General: Skin is warm and dry.  Neurological:     Comments: Confused. Unintelligible speech. Generalized weakness. Follows commands.    Psychiatric:        Behavior: Behavior normal.     ED Results / Procedures / Treatments   Labs (all labs ordered are listed, but only abnormal results are displayed) Labs Reviewed  COMPREHENSIVE METABOLIC PANEL - Abnormal; Notable for the following components:      Result Value   BUN 37 (*)    Creatinine, Ser 2.38 (*)    GFR calc non Af Amer 27 (*)    GFR calc Af Amer 31 (*)    All other components within normal  limits  CBC - Abnormal; Notable for the following components:   RBC 3.63 (*)    Hemoglobin 10.7 (*)    HCT 33.4 (*)    All other components within normal limits  CBG MONITORING, ED - Abnormal; Notable for the following components:   Glucose-Capillary 111 (*)    All other components within normal limits  RESPIRATORY PANEL BY RT PCR (FLU A&B, COVID)  URINE CULTURE  URINALYSIS, ROUTINE W REFLEX MICROSCOPIC  RAPID URINE DRUG SCREEN, HOSP PERFORMED  AMMONIA  ETHANOL  BASIC METABOLIC PANEL    EKG EKG Interpretation  Date/Time:  Sunday May 12 2020 12:18:04 EDT Ventricular Rate:  67 PR Interval:    QRS Duration: 94 QT Interval:  410 QTC Calculation: 433 R Axis:   85 Text Interpretation: Sinus rhythm Borderline right axis deviation Confirmed by Quintella Reichert [phone]) on 05/12/2020 1:02:41 PM   Radiology CT Head Wo Contrast  Result Date: 05/12/2020 CLINICAL DATA:  Delirium. EXAM: CT HEAD WITHOUT CONTRAST TECHNIQUE: Contiguous axial images were obtained from the base of the skull through the vertex without intravenous contrast. COMPARISON:  CT head 04/23/2020 FINDINGS: Brain: No evidence of acute infarction, hemorrhage, hydrocephalus, extra-axial collection or mass lesion/mass effect. Similar advanced white matter hypoattenuation, compatible with the sequela of chronic microvascular ischemic disease. Similar mildly age advanced cerebral atrophy with ex vacuo ventricular dilation. Similar small high left parietal cortical infarct. Vascular: Calcific  atherosclerosis. No hyperdense vessel identified. Skull: Normal. Negative for fracture or focal lesion. Sinuses/Orbits: No acute finding. Other: None. IMPRESSION: 1. No evidence of acute intracranial abnormality. 2. Similar age advanced chronic microvascular ischemic disease, generalized atrophy, and remote left parietal cortical infarct. Electronically Signed   By: Margaretha Sheffield MD   On: 05/12/2020 12:44   CT Cervical Spine Wo Contrast  Result Date: 05/12/2020 CLINICAL DATA:  Trauma.  Patient found down.  Altered mental status. EXAM: CT CERVICAL SPINE WITHOUT CONTRAST TECHNIQUE: Multidetector CT imaging of the cervical spine was performed without intravenous contrast. Multiplanar CT image reconstructions were also generated. COMPARISON:  None. FINDINGS: Alignment: No substantial subluxation.  Mild dextrocurvature. Skull base and vertebrae: No acute fracture. Vertebral body heights are maintained. No primary bone lesion or focal pathologic process. Soft tissues and spinal canal: No prevertebral fluid or swelling. No visible canal hematoma. Disc levels: Focal facet arthropathy at C2-C3 and C3-C4. Mild multilevel degenerative disc disease. No evidence of advanced bony canal stenosis. Upper chest: Negative. Other: None. IMPRESSION: No evidence of acute fracture or traumatic malalignment. Electronically Signed   By: Margaretha Sheffield MD   On: 05/12/2020 12:48   DG Chest Port 1 View  Result Date: 05/12/2020 CLINICAL DATA:  Altered mental status. EXAM: PORTABLE CHEST 1 VIEW COMPARISON:  April 23, 2020 FINDINGS: Cardiomediastinal silhouette is normal. Mediastinal contours appear intact. Tortuosity of the aorta and calcific atherosclerotic disease at the aortic knob. There is no evidence of focal airspace consolidation, pleural effusion or pneumothorax. Osseous structures are without acute abnormality. Soft tissues are grossly normal. IMPRESSION: No active disease. Electronically Signed   By: Fidela Salisbury M.D.   On: 05/12/2020 12:02    Procedures Procedures (including critical care time)  Medications Ordered in ED Medications  nicotine (NICODERM CQ - dosed in mg/24 hours) patch 14 mg (has no administration in time range)  tamsulosin (FLOMAX) capsule 0.8 mg (has no administration in time range)  benztropine (COGENTIN) tablet 1 mg (has no administration in time range)  folic acid (FOLVITE) tablet 1 mg (has no administration  in time range)  multivitamin with minerals tablet 1 tablet (has no administration in time range)  thiamine tablet 100 mg (has no administration in time range)  ascorbic acid (VITAMIN C) tablet 500 mg (has no administration in time range)  enoxaparin (LOVENOX) injection 30 mg (has no administration in time range)  sodium chloride flush (NS) 0.9 % injection 3 mL (has no administration in time range)  sodium chloride flush (NS) 0.9 % injection 3 mL (has no administration in time range)  0.9 %  sodium chloride infusion (has no administration in time range)  acetaminophen (TYLENOL) tablet 650 mg (has no administration in time range)    Or  acetaminophen (TYLENOL) suppository 650 mg (has no administration in time range)  0.9 %  sodium chloride infusion (has no administration in time range)  amLODipine (NORVASC) tablet 5 mg (has no administration in time range)  sodium chloride 0.9 % bolus 500 mL (0 mLs Intravenous Stopped 05/12/20 1624)    ED Course  I have reviewed the triage vital signs and the nursing notes.  Pertinent labs & imaging results that were available during my care of the patient were reviewed by me and considered in my medical decision making (see chart for details).    MDM Rules/Calculators/A&P                         Patient presents the emergency department with altered mental status. Patient is lethargic but does arouse to loud verbal stimuli. He moves all extremities symmetrically but has unintelligible speech. Imaging is negative for acute  intracranial abnormality. Labs concerning for acute kidney injury. Patient unable to express any complaints. Will treat with IV fluid hydration. Plan to admit for further evaluation of his altered mental status and AKI  Final Clinical Impression(s) / ED Diagnoses Final diagnoses:  Stupor  AKI (acute kidney injury) [facility])    Rx / [place] Orders ED Discharge Orders    None       Quintella Reichert, MD 05/12/20 1837

## 2020-05-12 NOTE — H&P (Signed)
[facility] Admission History and Physical Service Pager: [phone]  Patient name: [name] Medical record number: [id number] Date of birth: 1951/01/03 Age: 69 y.o. Gender: male  Primary Care Provider: Danna Hefty, DO Consultants: None Code Status: Full code Preferred Emergency Contact: Trafton Roker (wife) [phone]  Chief Complaint: AKI  Assessment and Plan: [name] is a 69 y.o. male presenting with acute kidney injury . PMH is significant for recent right foot crush injury s/p ORIF, primary schizophrenia, HFpEF, HTN, HLD, BPH, tobacco abuse, GERD, history of hep C s/p [place], history of hep B cleared  AKI in the setting of CKD stage IIIa Patient creatinine on admission was 2.38 (baseline 1.3), BUN 37. In the ED, patient given 500 mL NS bolus. AKI likely due to prerenal causes, more specifically dehydration. Patient was recently discharged from the hospital on 9/23 to [facility] and has been missing from the facility since 4 PM on 9/25. Patient has a history of decreased p.o. intake when not prompted. -Admit to FPTS, Med-surg, attending Dr. Nori Riis -Vitals per routine -AM BMP -Follow-up urine culture -Consult TOC for disposition -mIVF NS @100  mL/h x 12 hours, reassess need in the morning  Schizophrenia Patient arrived via [facility] after being found lying balance at her [address] stating "I was just taking a catnap", EMS placed c-collar on patient. Patient was thought to be encephalopathic on arrival to the ED as he has baseline wandering speech with echolalia and difficult to understand speech.  In the ED patient was worked up for altered mental status labs within normal limit for alcohol and ammonia, negative UDS, unremarkable UA, normal CXR. CT head: No evidence of acute intracranial abnormality, similar age advanced chronic microvascular ischemic disease and generalized atrophy, and remote left parietal cortical infarct.  On exam  patient appears to be at his baseline is A&O x4. Home medications include Vistaril 50 mg QHS, Prolixin 25 mg injection q14days (next dose due 10/7), benztropine 1 mg BID. -Continue home benztropine -Consider adding Vistaril as needed for agitation.  Right foot fractures s/p ORIF During last presentation, patient was found to have displaced fractures at the second through fourth metatarsals, and avulsion fracture of the base of the fifth metatarsal, and nondisplaced fracture through the lateral malleolus, nondisplaced fracture of the dorsal anterior talus, fracture medial cuneiform and a nondisplaced injury at the attachment site of the Lisfranc ligament.  [name] performed ORIF of the Lisfranc dislocation and stabilization of the base of the first metatarsal, medial cuneiform, and stabilization of the Lisfranc complex on 9/14. Patient was to be non-weightbearing but has been up and walking around in his walking boot.  Surgical sites appeared clean, dry, well-healing. Dorsalis pedis pulses 2+. -Wound care nursing  -PT/OT eval and treat -Tylenol as needed for pain -Up with assistance  HTN Blood pressure is while in the ED have ranged from 150-170s /80-90s. Home medications include Norvasc, Cozaar. During last admission, patient was normotensive throughout and the patient reported he was not taking his daily medications. -Continue to monitor BP -Hold home Cozaar, reassess for need later -Restarting home Norvasc 5 mg  Normocytic anemia, chronic, stable Patient was admitted with a Hgb of 10.7, hemoglobin was 10.8 on 9/15, with a baseline Hgb 10-11.  During last admission, patient's CBC was trended and stable during entire admission. Recommend outpatient follow-up, to include colonoscopy. -Continue to trend with CBCs every few days -Continue folic acid 1 mg daily  BPH Chronic  and stable.  Medications include Flomax 0.8 mg daily. -Continue home Flomax  Nicotine dependence Patient has history of  nicotine dependence, on previous admission was on nicotine patch consistently. -Continue nicotine patch  Neck pain, resolved Patient had to be EMS after being found lying down on [address] stating "I was just taking catnap".  EMS placed c-collar on patient.  Patient initially denied neck pain, but stated to the RN later that there was pain.  Patient received CT of spine with no evidence of acute fracture or traumatic malalignment. On admission exam patient did not complain of any neck pain. -Continue to monitor   FEN/GI: Healthy diet, repeat electrolytes as necessary Prophylaxis: Lovenox  Disposition: MedSurg  History of Present Illness:  [name] is a 69 y.o. male presenting with AKI.  Patient is difficult historian due to schizophrenia, most history was obtained from prior notes.  It was brought into the hospital by EMS after being found lying down on [address] stating "I was just taking catnap".  Patient was recently mid to the hospital on 04/23/2020 with a crush injury of his right foot.  During his hospital stay he was treated with ORIF.  On 05/09/2020 patient was discharged from the hospital to [facility].  Patient reportedly kept leaving the facility and had to be collected several times; yesterday around 4 PM patient was found to be missing from a facility.  He was found today by EMS and appeared to have not sustained any trauma.   Review Of Systems: Per HPI with the following additions: limited ROS due to psychiatric disorder.  Review of Systems  Respiratory: Negative for cough.   Cardiovascular: Negative for chest pain.  Gastrointestinal: Negative for abdominal pain.  Musculoskeletal: Negative for back pain and neck pain.     Patient Active Problem List   Diagnosis Date Noted  . Lisfranc dislocation, right, initial encounter   . Closed displaced fracture of fifth metatarsal bone of right foot   . History of hepatitis B 04/26/2020  . Chronic hepatitis ([place])    . Crush injury   . Dementia without behavioral disturbance ([place])   . Fever of unknown origin   . Fever 04/24/2020  . Atrial fibrillation with RVR ([place]) 04/24/2020  . Malnutrition of moderate degree 04/24/2020  . Acute metabolic encephalopathy 50/38/8828  . SIRS (systemic inflammatory response syndrome) ([place]) 04/23/2020  . Acute encephalopathy 04/23/2020  . GERD (gastroesophageal reflux disease) 05/03/2019  . Hypotension 08/02/2018  . Low back sprain, initial encounter 10/21/2017  . History of drug abuse ([place]) 10/18/2017  . Gingival erythema 09/23/2017  . Chronic pain of left knee 11/24/2016  . Tobacco abuse   . Diastolic heart failure ([place]) 02/05/2015  . Healthcare maintenance 05/28/2014  . Hyperlipidemia 03/10/2014  . Patellar tendonitis 11/16/2012  . [place] OCCLUSION 06/03/2010  . PARANOID SCHIZOPHRENIA, CHRONIC 01/17/2009  . HYPERTENSION, BENIGN ESSENTIAL 01/17/2009  . BPH (benign prostatic hyperplasia) 01/17/2009  . History of hepatitis C 12/14/2008  . DEPRESSION 12/14/2008    Past Medical History: Past Medical History:  Diagnosis Date  . BPH (benign prostatic hyperplasia)   . Colon polyp 2010  . Depression   . GERD (gastroesophageal reflux disease)   . Hepatitis C    "caught it when I had a blood transfusion"  . High cholesterol   . History of blood transfusion    "when I was young"  . Hypertension   . Paranoid schizophrenia ([place])   . Prostate atrophy   .  Retinal vein occlusion     Past Surgical History: Past Surgical History:  Procedure Laterality Date  . CIRCUMCISION  1959  . ORIF TOE FRACTURE Right 04/30/2020   Procedure: OPEN REDUCTION INTERNAL FIXATION (ORIF) RIGHT FOOT METATARSAL FRACTURES;  Surgeon: Newt Minion, MD;  Location: [facility];  Service: Orthopedics;  Laterality: Right;  . TONSILLECTOMY      Social History: Social History   Tobacco Use  . Smoking status: Current Every Day Smoker    Packs/day: 0.50    Years: 48.00     Pack years: 24.00    Types: Cigarettes  . Smokeless tobacco: Never Used  Substance Use Topics  . Alcohol use: Yes  . Drug use: No   Additional social history:   Please also refer to relevant sections of EMR.  Family History: Family History  Problem Relation Age of Onset  . Hypertension Mother   . Diabetes Mother   . Stroke Mother     Allergies and Medications: Allergies  Allergen Reactions  . Lisinopril Other (See Comments)    Cough  . Penicillins Hives  . Amoxicillin Rash  . Ibuprofen Rash   No current facility-administered medications on file prior to encounter.   Current Outpatient Medications on File Prior to Encounter  Medication Sig Dispense Refill  . acetaminophen (TYLENOL) 325 MG tablet Take 2 tablets (650 mg total) by mouth every 6 (six) hours as needed for mild pain, moderate pain or headache. 30 tablet 0  . benztropine (COGENTIN) 1 MG tablet Take 1 tablet (1 mg total) by mouth 2 (two) times daily. 60 tablet 0  . cetirizine (ZYRTEC ALLERGY) 10 MG tablet Take 1 tablet (10 mg total) by mouth daily. 30 tablet 1  . fluPHENAZine decanoate (PROLIXIN) 25 MG/ML injection Inject 1 mL (25 mg total) into the muscle every 14 (fourteen) days. Last dose 03/27/90 5 mL 11  . folic acid (FOLVITE) 1 MG tablet Take 1 tablet (1 mg total) by mouth daily. 30 tablet 0  . Multiple Vitamin (MULTIVITAMIN WITH MINERALS) TABS tablet Take 1 tablet by mouth daily. 30 tablet 6  . nicotine (NICODERM CQ - DOSED IN MG/24 HOURS) 14 mg/24hr patch Place 1 patch (14 mg total) onto the skin daily. 28 patch 0  . tamsulosin (FLOMAX) 0.4 MG CAPS capsule TAKE 2 CAPSULE BY MOUTH DAILY (Patient taking differently: Take 0.8 mg by mouth daily. ) 60 capsule 2  . thiamine 100 MG tablet Take 1 tablet (100 mg total) by mouth daily. 30 tablet 0  . vitamin C (ASCORBIC ACID) 500 MG tablet Take 500 mg by mouth daily.    . [DISCONTINUED] loratadine (CLARITIN) 10 MG tablet Take 1 tablet (10 mg total) by mouth daily. 30  tablet 0    Objective: BP (!) 166/88   Pulse 71   Temp 98.6 F (37 C) (Oral)   Resp 14   Ht 5\' 9"  (1.753 m)   Wt 65 kg   SpO2 100%   BMI 21.16 kg/m  Exam: General: in good spirits, NAD Eyes: PERRLA, EOMI ENTM: No teeth, mucosa moist Neck: Supple, normal ROM Cardiovascular: RRR, no M/R/G appreciated Respiratory: CTAB, no increased WOB, no wheezes/rales/rhonchi Gastrointestinal: Soft, nontender, nondistended, bowel sounds present MSK: RLE covered in dressing with walking boot, LLE warm, dry Derm: Dry skin with flaking noted on legs Neuro: A&O x4, CN II through XII grossly intact, 4/5 strength on RLE Psych: Pleasant mood, echolalia, no current hallucinations  Labs and Imaging: CBC BMET  Recent Labs  Lab 05/12/20 1231  WBC 6.8  HGB 10.7*  HCT 33.4*  PLT 214   Recent Labs  Lab 05/12/20 1231  NA 142  K 4.2  CL 102  CO2 29  BUN 37*  CREATININE 2.38*  GLUCOSE 95  CALCIUM 9.5     EKG: Sinus rhythm   Oza Oberle, DO 05/12/2020, 3:29 PM PGY-1, [place] Intern pager: [phone], text pages welcome

## 2020-05-12 NOTE — ED Triage Notes (Addendum)
Pt to triage via [facility] after being found lying on the side of [employer].  Pt states, "I was just taking a cat nap."  EMS placed c-collar on pt.  Pt initially denied neck pain to RN but now states it does hurt "a little."  Denies injury. Pt falling asleep in wheelchair.  States he is just tired.  Pt slow to follow commands and speech is garbled at times.  Bilateral arm drift.

## 2020-05-12 NOTE — Plan of Care (Signed)
  Problem: Pain Managment: Goal: General experience of comfort will improve Outcome: Progressing   Problem: Safety: Goal: Ability to remain free from injury will improve Outcome: Progressing   Problem: Skin Integrity: Goal: Risk for impaired skin integrity will decrease Outcome: Progressing   

## 2020-05-12 NOTE — Progress Notes (Addendum)
Pt arrived to the unit a&ox2, disoriented to situation and time. Pt walked to bed from wheelchair, wiped down with CHG and belongings gathered and charted. Pt kept repeating everything said by RN and mentioned his wife anglea Mcgourty, VSS, pt made comfortable and given supper to eat. Pt also arrived with an old surgical incision on the dorsal side of his left foot. Dressing is clean dry and intact, appears new from ED. Site is clean and dry. Will continue to monitor.

## 2020-05-12 NOTE — ED Notes (Signed)
Patient transported to CT 

## 2020-05-12 NOTE — Plan of Care (Signed)
  Problem: Education: Goal: Knowledge of General Education information will improve Description: Including pain rating scale, medication(s)/side effects and non-pharmacologic comfort measures Outcome: Progressing   Problem: Activity: Goal: Risk for activity intolerance will decrease Outcome: Progressing   

## 2020-05-13 ENCOUNTER — Ambulatory Visit (HOSPITAL_COMMUNITY): Payer: Medicare Other

## 2020-05-13 ENCOUNTER — Encounter (HOSPITAL_COMMUNITY): Payer: Self-pay

## 2020-05-13 DIAGNOSIS — N179 Acute kidney failure, unspecified: Principal | ICD-10-CM

## 2020-05-13 DIAGNOSIS — R401 Stupor: Secondary | ICD-10-CM | POA: Diagnosis not present

## 2020-05-13 LAB — BASIC METABOLIC PANEL
Anion gap: 9 (ref 5–15)
BUN: 26 mg/dL — ABNORMAL HIGH (ref 8–23)
CO2: 25 mmol/L (ref 22–32)
Calcium: 8.6 mg/dL — ABNORMAL LOW (ref 8.9–10.3)
Chloride: 106 mmol/L (ref 98–111)
Creatinine, Ser: 1.43 mg/dL — ABNORMAL HIGH (ref 0.61–1.24)
GFR calc Af Amer: 58 mL/min — ABNORMAL LOW (ref >60)
GFR calc non Af Amer: 50 mL/min — ABNORMAL LOW (ref >60)
Glucose, Bld: 94 mg/dL (ref 70–99)
Potassium: 4.5 mmol/L (ref 3.5–5.1)
Sodium: 140 mmol/L (ref 135–145)

## 2020-05-13 LAB — URINE CULTURE

## 2020-05-13 MED ORDER — ENOXAPARIN SODIUM 40 MG/0.4ML ~~LOC~~ SOLN
40.0000 mg | SUBCUTANEOUS | Status: DC
  Administered 2020-05-14 – 2020-05-15 (×2): 40 mg via SUBCUTANEOUS
  Filled 2020-05-13 (×2): qty 0.4

## 2020-05-13 MED ORDER — SODIUM CHLORIDE 0.9 % IV SOLN: INTRAVENOUS | Status: AC

## 2020-05-13 NOTE — Plan of Care (Signed)

## 2020-05-13 NOTE — Evaluation (Addendum)
Physical Therapy Evaluation Patient Details Name: [name] MRN: [id number] DOB: 10/01/1950 Today's Date: 05/13/2020   History of Present Illness  [name] is a 69 y.o. male presenting with acute kidney injury . PMH is significant for recent right foot crush injury s/p ORIF (dc on 9/23), primary schizophrenia, HFpEF, HTN, HLD, BPH, tobacco abuse, GERD, history of hep C s/p Harvoni  Clinical Impression   Patient received in bed, pleasantly confused but overall cooperative with therapy. See below for physical assist levels with mobility. Remains cognitively unable to process or understand sequencing to maintain NWB on surgical LE, self-selects WBAT on R LE In CAM boot with RW and very difficult to correct in terms of precautions, but steady with AD. No signs of increased pain with gait however did limit him to short distance in room due to poor adherence to precautions. Left up in recliner with all needs met, chair alarm active and nursing staff aware of patient status. In definite need of SNF and 24/7A moving forward.      Follow Up Recommendations SNF;Supervision/Assistance - 24 hour    Equipment Recommendations  Wheelchair (measurements PT);Wheelchair cushion (measurements PT)    Recommendations for Other Services       Precautions / Restrictions Precautions Precautions: Fall Precaution Comments: pt unable to follow NWB precautions Required Braces or Orthoses: Other Brace Other Brace: CAM boot RLE Restrictions Weight Bearing Restrictions: Yes RLE Weight Bearing: Weight bearing as tolerated Other Position/Activity Restrictions: has been non-compliant and walking in CAM boot      Mobility  Bed Mobility Overal bed mobility: Needs Assistance Bed Mobility: Supine to Sit     Supine to sit: Supervision     General bed mobility comments: increased time and effort, but able to perform without assistance  Transfers Overall transfer level: Needs assistance Equipment used:  Rolling walker (2 wheeled) Transfers: Sit to/from Stand Sit to Stand: Min guard         General transfer comment: min guard and cues for sequencing with all functional transfers  Ambulation/Gait Ambulation/Gait assistance: Min guard Gait Distance (Feet): 40 Feet (45f + 135f+ 105fAssistive device: Rolling walker (2 wheeled) Gait Pattern/deviations: Step-through pattern;Decreased step length - left;Decreased stance time - right;Trunk flexed Gait velocity: decreased   General Gait Details: unable to really maintain WB precautions even with RW, walked around room with CAM boot/RW with no signs of increased pain. History of self-selecting full WB on foot even with NWB precautions from MD, cognitively unable to process sequencing for NWB>  Stairs            Wheelchair Mobility    Modified Rankin (Stroke Patients Only)       Balance Overall balance assessment: Needs assistance Sitting-balance support: Feet supported;No upper extremity supported Sitting balance-Leahy Scale: Good     Standing balance support: During functional activity;Bilateral upper extremity supported Standing balance-Leahy Scale: Good                               Pertinent Vitals/Pain Pain Assessment: Faces Faces Pain Scale: No hurt Pain Intervention(s): Limited activity within patient's tolerance;Monitored during session;Repositioned    Home Living Family/patient expects to be discharged to:: Assisted living                 Additional Comments: Per chart he is from ALF?  Pt is unable to provide accurate hx.    Prior Function  Comments: Unsure as pt unable to report.      Hand Dominance        Extremity/Trunk Assessment   Upper Extremity Assessment Upper Extremity Assessment: Defer to OT evaluation    Lower Extremity Assessment Lower Extremity Assessment: Generalized weakness    Cervical / Trunk Assessment Cervical / Trunk Assessment: Kyphotic   Communication   Communication: Expressive difficulties (difficulty expressing himself and often repeats what staff says)  Cognition Arousal/Alertness: Awake/alert Behavior During Therapy: Impulsive;Restless Overall Cognitive Status: Difficult to assess Area of Impairment: Attention;Following commands;Memory;Safety/judgement;Awareness;Problem solving                 Orientation Level: Situation;Time;Place;Disoriented to Current Attention Level: Focused Memory: Decreased short-term memory;Decreased recall of precautions Following Commands: Follows one step commands inconsistently;Follows one step commands with increased time Safety/Judgement: Decreased awareness of safety;Decreased awareness of deficits Awareness: Intellectual Problem Solving: Difficulty sequencing;Requires verbal cues;Requires tactile cues;Slow processing General Comments: psych history, possibly at baseline. Inconsistent following of 1 step commands.      General Comments      Exercises     Assessment/Plan    PT Assessment Patient needs continued PT services  PT Problem List Decreased strength;Decreased range of motion;Decreased activity tolerance;Decreased balance;Decreased mobility;Decreased cognition;Decreased knowledge of use of DME;Decreased safety awareness;Decreased knowledge of precautions;Pain       PT Treatment Interventions DME instruction;Gait training;Functional mobility training;Therapeutic activities;Therapeutic exercise;Balance training;Cognitive remediation;Patient/family education    PT Goals (Current goals can be found in the Care Plan section)  Acute Rehab PT Goals Patient Stated Goal: unable to state PT Goal Formulation: Patient unable to participate in goal setting Time For Goal Achievement: 05/27/20 Potential to Achieve Goals: Fair    Frequency Min 2X/week   Barriers to discharge        Co-evaluation               AM-PAC PT "6 Clicks" Mobility  Outcome Measure  Help needed turning from your back to your side while in a flat bed without using bedrails?: None Help needed moving from lying on your back to sitting on the side of a flat bed without using bedrails?: None Help needed moving to and from a bed to a chair (including a wheelchair)?: A Little Help needed standing up from a chair using your arms (e.g., wheelchair or bedside chair)?: A Little Help needed to walk in hospital room?: A Little Help needed climbing 3-5 steps with a railing? : A Lot 6 Click Score: 19    End of Session Equipment Utilized During Treatment: Gait belt Activity Tolerance: Patient tolerated treatment well Patient left: in chair;with call bell/phone within reach;with chair alarm set Nurse Communication: Mobility status PT Visit Diagnosis: Other abnormalities of gait and mobility (R26.89);Difficulty in walking, not elsewhere classified (R26.2) Pain - Right/Left: Right Pain - part of body: Ankle and joints of foot    Time: 5170-0174 PT Time Calculation (min) (ACUTE ONLY): 29 min   Charges:   PT Evaluation $PT Eval Moderate Complexity: 1 Mod (co-eval with OT)          Windell Norfolk, DPT, PN1   Supplemental Physical Therapist [place]    Pager [phone] Acute Rehab Office [phone]

## 2020-05-13 NOTE — Progress Notes (Signed)
Made a home visit to his new residence at [facility] fri pm to give him his Prolixin D 1 cc. He is two weeks late for it due to being in the hospital with fx toes and lab concerns. He told Probation officer he loved me, asked me to take  Him somewhere which Probation officer unable to Told him I would be back next week to check on him and in two weeks for his next shot. He was appropriate, denies psychotic sx.

## 2020-05-13 NOTE — Evaluation (Signed)
Occupational Therapy Evaluation Patient Details Name: [name] MRN: [id number] DOB: [birth year] Today's Date: 05/13/2020    History of Present Illness [name] is a 69 y.o. male presenting with acute kidney injury . PMH is significant for recent right foot crush injury s/p ORIF (dc on 9/23), primary schizophrenia, HFpEF, HTN, HLD, BPH, tobacco abuse, GERD, history of hep C s/p Harvoni   Clinical Impression   Pt with recent admission for ORIF in CAM boot. Today Pt is min guard assist for mobility with RW WBAT through CAM boot. MAx A for peri care in standing, min A for standing sink level grooming with one posterior LOB that required Ot assist to correct. Pt max A for LB ADL at this time. OT will continue to follow acutely and Pt will require SNF post-acute. Pt is very pleasant and cooperative and while hard to understand due to expressive difficulties, he is following single commands most of the time.     Follow Up Recommendations  SNF    Equipment Recommendations  None recommended by OT    Recommendations for Other Services       Precautions / Restrictions Precautions Precautions: Fall Precaution Comments: pt unable to follow NWB precautions Required Braces or Orthoses: Other Brace Other Brace: CAM boot RLE Restrictions Weight Bearing Restrictions: Yes RLE Weight Bearing: Weight bearing as tolerated Other Position/Activity Restrictions: has been non-compliant and walking in CAM boot      Mobility Bed Mobility Overal bed mobility: Needs Assistance Bed Mobility: Supine to Sit     Supine to sit: Supervision     General bed mobility comments: increased time and effort, but able to perform without assistance  Transfers Overall transfer level: Needs assistance Equipment used: Rolling walker (2 wheeled) Transfers: Sit to/from Stand Sit to Stand: Min guard         General transfer comment: min guard and cues for sequencing with all functional transfers     Balance Overall balance assessment: Needs assistance Sitting-balance support: Feet supported;No upper extremity supported Sitting balance-Leahy Scale: Good     Standing balance support: During functional activity;Bilateral upper extremity supported Standing balance-Leahy Scale: Fair                             ADL either performed or assessed with clinical judgement   ADL Overall ADL's : Needs assistance/impaired Eating/Feeding: Set up;Sitting Eating/Feeding Details (indicate cue type and reason): needed assist opening containers and cutting up food Grooming: Minimal assistance;Wash/dry hands;Standing;Cueing for sequencing Grooming Details (indicate cue type and reason): washed hands with cues in standing at sink, posterior LOB that required assist to correct Upper Body Bathing: Minimal assistance   Lower Body Bathing: Moderate assistance   Upper Body Dressing : Minimal assistance   Lower Body Dressing: Maximal assistance;Bed level Lower Body Dressing Details (indicate cue type and reason): to don CAM boot in supine Toilet Transfer: Minimal assistance;Min guard;Ambulation;RW   Toileting- Clothing Manipulation and Hygiene: Maximal assistance;Sit to/from stand       Functional mobility during ADLs: Minimal assistance;Cueing for safety;Rolling walker       Vision         Perception     Praxis      Pertinent Vitals/Pain Pain Assessment: Faces Faces Pain Scale: No hurt Pain Intervention(s): Monitored during session     Hand Dominance     Extremity/Trunk Assessment Upper Extremity Assessment Upper Extremity Assessment: Overall WFL for tasks assessed   Lower  Extremity Assessment Lower Extremity Assessment: Overall WFL for tasks assessed;Defer to PT evaluation   Cervical / Trunk Assessment Cervical / Trunk Assessment: Kyphotic   Communication Communication Communication: Expressive difficulties   Cognition Arousal/Alertness: Awake/alert Behavior  During Therapy: Impulsive;Restless Overall Cognitive Status: Difficult to assess Area of Impairment: Attention;Following commands;Memory;Safety/judgement;Awareness;Problem solving                 Orientation Level: Situation;Time;Place;Disoriented to Current Attention Level: Focused Memory: Decreased short-term memory;Decreased recall of precautions Following Commands: Follows one step commands inconsistently;Follows one step commands with increased time Safety/Judgement: Decreased awareness of safety;Decreased awareness of deficits Awareness: Intellectual Problem Solving: Difficulty sequencing;Requires verbal cues;Requires tactile cues;Slow processing General Comments: pt very pleasant, psych history, possibly at baseline. Inconsistent following of 1 step commands.   General Comments       Exercises     Shoulder Instructions      Home Living Family/patient expects to be discharged to:: Assisted living                                 Additional Comments: Per chart he is from ALF?  Pt is unable to provide accurate hx.      Prior Functioning/Environment          Comments: Unsure as pt unable to report.         OT Problem List: Decreased strength;Decreased knowledge of use of DME or AE;Decreased knowledge of precautions;Decreased activity tolerance;Decreased cognition;Impaired balance (sitting and/or standing);Decreased safety awareness;Pain      OT Treatment/Interventions: Self-care/ADL training;Therapeutic exercise;Patient/family education;Balance training;Energy conservation;Therapeutic activities;DME and/or AE instruction;Cognitive remediation/compensation    OT Goals(Current goals can be found in the care plan section) Acute Rehab OT Goals Patient Stated Goal: unable to state OT Goal Formulation: Patient unable to participate in goal setting Time For Goal Achievement: 05/27/20 Potential to Achieve Goals: Good ADL Goals Pt Will Perform Grooming:  with supervision;standing Pt Will Perform Lower Body Dressing: with min assist;sitting/lateral leans Pt Will Transfer to Toilet: with supervision;ambulating Pt Will Perform Toileting - Clothing Manipulation and hygiene: with min guard assist;sit to/from stand  OT Frequency: Min 2X/week   Barriers to D/C:            Co-evaluation              AM-PAC OT "6 Clicks" Daily Activity     Outcome Measure Help from another person eating meals?: A Little Help from another person taking care of personal grooming?: A Little Help from another person toileting, which includes using toliet, bedpan, or urinal?: A Lot Help from another person bathing (including washing, rinsing, drying)?: A Lot Help from another person to put on and taking off regular upper body clothing?: A Little Help from another person to put on and taking off regular lower body clothing?: A Lot 6 Click Score: 15   End of Session Equipment Utilized During Treatment: Gait belt;Rolling walker;Other (comment) (CAM boot) Nurse Communication: Mobility status;Precautions  Activity Tolerance: Patient tolerated treatment well Patient left: in chair;with call bell/phone within reach;with chair alarm set  OT Visit Diagnosis: Unsteadiness on feet (R26.81);Other abnormalities of gait and mobility (R26.89);Muscle weakness (generalized) (M62.81);Other symptoms and signs involving cognitive function                Time: 5397-6734 OT Time Calculation (min): 31 min Charges:  OT General Charges $OT Visit: 1 Visit OT Evaluation $OT Eval Moderate Complexity: 1 Mod  Jesse Sans  OTR/L Acute Rehabilitation Services Pager: [phone] Office: [place] 05/13/2020, 1:27 PM

## 2020-05-13 NOTE — Progress Notes (Signed)
Physical Therapy Treatment Patient Details Name: [name] MRN: [id number] DOB: 1951/07/09 Today's Date: 05/13/2020    History of Present Illness [name] is a 69 y.o. male presenting with acute kidney injury . PMH is significant for recent right foot crush injury s/p ORIF (dc on 9/23), primary schizophrenia, HFpEF, HTN, HLD, BPH, tobacco abuse, GERD, history of hep C s/p Harvoni    PT Comments    Patient received at EOB impulsively trying to stand up and completely tangled in lines- needed Max cues to get him to stay at [facility] and wait for PT to be ready. Fixated on walking into bathroom and unable to redirect or educate on NWB status/prevent him from getting up and walking in room due to cognitive status. Very impulsive and unsafe with gait due to pushing RW out of the way and constantly getting tangled in lines. Attempted to reinforce NWB on surgical foot with gait but completely unsuccessful today as he continues to self-select WBAT and does not respond to cues/commands for NWB on that foot. Left in bed with all needs met, bed alarm active.     Follow Up Recommendations  SNF;Supervision/Assistance - 24 hour     Equipment Recommendations  Wheelchair (measurements PT);Wheelchair cushion (measurements PT)    Recommendations for Other Services       Precautions / Restrictions Precautions Precautions: Fall Precaution Comments: pt unable to follow NWB precautions Required Braces or Orthoses: Other Brace Other Brace: CAM boot RLE Restrictions Weight Bearing Restrictions: Yes RLE Weight Bearing: Weight bearing as tolerated Other Position/Activity Restrictions: has been non-compliant and walking in CAM boot    Mobility  Bed Mobility Overal bed mobility: Needs Assistance Bed Mobility: Supine to Sit     Supine to sit: Supervision     General bed mobility comments: sitting at EOB, impulsive and trying to stand up and needed max cues to prevent impulsively and unsafely  standing  Transfers Overall transfer level: Needs assistance Equipment used: Rolling walker (2 wheeled) Transfers: Sit to/from Stand Sit to Stand: Min guard         General transfer comment: min guard and cues for sequencing with all functional transfers  Ambulation/Gait Ambulation/Gait assistance: Min guard Gait Distance (Feet): 20 Feet (42ftx2) Assistive device: Rolling walker (2 wheeled) Gait Pattern/deviations: Step-through pattern;Decreased step length - left;Decreased stance time - right;Trunk flexed Gait velocity: decreased   General Gait Details: ambulated in to and out of bathroom, needed max TC and VC for safety with RW as he kept pushing it to the side and getting it tangled in his IV   Stairs             Wheelchair Mobility    Modified Rankin (Stroke Patients Only)       Balance Overall balance assessment: Needs assistance Sitting-balance support: Feet supported;No upper extremity supported Sitting balance-Leahy Scale: Good     Standing balance support: During functional activity;Bilateral upper extremity supported Standing balance-Leahy Scale: Fair Standing balance comment: able to stand without support, but not supposed to ambulate at this time                            Cognition Arousal/Alertness: Awake/alert Behavior During Therapy: Impulsive;Restless Overall Cognitive Status: Difficult to assess Area of Impairment: Attention;Following commands;Memory;Safety/judgement;Awareness;Problem solving                 Orientation Level: Situation;Time;Place;Disoriented to Current Attention Level: Focused Memory: Decreased short-term memory;Decreased recall of  precautions Following Commands: Follows one step commands inconsistently;Follows one step commands with increased time Safety/Judgement: Decreased awareness of safety;Decreased awareness of deficits Awareness: Intellectual Problem Solving: Difficulty sequencing;Requires  verbal cues;Requires tactile cues;Slow processing General Comments: pt very pleasant, psych history, possibly at baseline. Inconsistent following of 1 step commands.      Exercises      General Comments        Pertinent Vitals/Pain Pain Assessment: Faces Faces Pain Scale: No hurt Pain Intervention(s): Limited activity within patient's tolerance;Monitored during session    [place] expects to be discharged to:: Assisted living               Additional Comments: Per chart he is from ALF?  Pt is unable to provide accurate hx.    Prior Function        Comments: Unsure as pt unable to report.    PT Goals (current goals can now be found in the care plan section) Acute Rehab PT Goals Patient Stated Goal: unable to state PT Goal Formulation: Patient unable to participate in goal setting Time For Goal Achievement: 05/27/20 Potential to Achieve Goals: Fair Progress towards PT goals: Not progressing toward goals - comment (inabilty to maintain NWB due to cognitive status)    Frequency    Min 2X/week      PT Plan Current plan remains appropriate    Co-evaluation              AM-PAC PT "6 Clicks" Mobility   Outcome Measure  Help needed turning from your back to your side while in a flat bed without using bedrails?: None Help needed moving from lying on your back to sitting on the side of a flat bed without using bedrails?: None Help needed moving to and from a bed to a chair (including a wheelchair)?: A Little Help needed standing up from a chair using your arms (e.g., wheelchair or bedside chair)?: A Little Help needed to walk in hospital room?: A Little Help needed climbing 3-5 steps with a railing? : A Lot 6 Click Score: 19    End of Session Equipment Utilized During Treatment: Gait belt Activity Tolerance: Patient tolerated treatment well Patient left: in bed;with call bell/phone within reach;with bed alarm set Nurse Communication:  Mobility status PT Visit Diagnosis: Other abnormalities of gait and mobility (R26.89);Difficulty in walking, not elsewhere classified (R26.2) Pain - Right/Left: Right Pain - part of body: Ankle and joints of foot     Time: 1525-1540 PT Time Calculation (min) (ACUTE ONLY): 15 min  Charges:  $Therapeutic Activity: 8-22 mins                     Windell Norfolk, DPT, PN1   Supplemental Physical Therapist [place]    Pager [phone] Acute Rehab Office [phone]

## 2020-05-13 NOTE — Progress Notes (Addendum)
Family Medicine Teaching Service Daily Progress Note Intern Pager: [phone]  Patient name: [name] Medical record number: [id number] Date of birth: [birth year] Age: 69 y.o. Gender: male  Primary Care Provider: Danna Hefty, DO Consultants: None Code Status: Full  Pt Overview and Major Events to Date:  Admitted on 9/26  Assessment and Plan:  [name] is a 69 y.o. male presenting with acute kidney injury . PMH is significant for recent right foot crush injury s/p ORIF, primary schizophrenia, HFpEF, HTN, HLD, BPH, tobacco abuse, GERD, history of hep C s/p Harvoni, history of hep B cleared   AKI in the setting of CKD stage IIIa Patient creatinine on admission was 2.38 (baseline 1.3), BUN 37. In the ED, patient given 500 mL NS bolus. AKI likely due to prerenal causes, more specifically dehydration. Patient was recently discharged from the hospital on 9/23 to [facility] and has been missing from the facility since 4 PM on 9/25. Patient has a history of decreased p.o. intake when not prompted. Will continue with NS 100 cc/hr for 10 hours. TOC has been consulted for disposition -Vitals per routine -AM BMP -Follow-up urine culture -Appreciate TOC assistance  -IVF NS @100  mL/h x 10 hours, reassess need in the morning   Schizophrenia Patient arrived via [facility] after being found lying balance at her [address] stating "I was just taking a catnap", EMS placed c-collar on patient. Patient was thought to be encephalopathic on arrival to the ED as he has baseline wandering speech with echolalia and difficult to understand speech.  In the ED patient was worked up for altered mental status labs within normal limit for alcohol and ammonia, negative UDS, unremarkable UA, normal CXR. CT head: No evidence of acute intracranial abnormality, similar age advanced chronic microvascular ischemic disease and generalized atrophy, and remote left parietal cortical infarct. Home medications  include Vistaril 50 mg QHS, Prolixin 25 mg injection q14days (next dose due 10/7), benztropine 1 mg BID. On exam patient is at his baseline. Very cheerful and thankful for the care he is receiving.  -Continue home benztropine -Consider adding Vistaril as needed for agitation. -Continue to monitor   Right foot fractures s/p ORIF [name] performed ORIF 9/14. Patient was to be non-weightbearing but has been up and walking around in his walking boot.  Surgical site dressing is clean, dry, well-healing. He reports no pain today. -Wound care nursing  -PT/OT eval and treat -Tylenol as needed for pain -Up with assistance   HTN Blood pressure this morning was 126/77. Home medications include Norvasc, Cozaar. During last admission, patient was normotensive throughout and the patient reported he was not taking his daily medications. -Continue to monitor BP -Hold home Cozaar, reassess for need later -Continue Norvasc 5 mg   Normocytic anemia, chronic, stable Patient was admitted with a Hgb of 10.7, hemoglobin today is stable at 10.7, with a baseline Hgb 10-11. During last admission, patient's CBC was trended and stable during entire admission. Recommend outpatient follow-up, to include colonoscopy. -Continue to trend with CBCs every few days -Continue folic acid 1 mg daily   BPH Chronic and stable.  Medications include Flomax 0.8 mg daily. -Continue home Flomax   Nicotine dependence Patient has history of nicotine dependence, on previous admission was on nicotine patch consistently. -Continue nicotine patch   Neck pain, resolved CT Spine shows no acute fracture. Has no complaints of pain today. -Continue to monitor   FEN/GI: Healthy diet, IVF NS @100  mL/h  x 10 hours PPx: Lovenox  Disposition: Med-Surg  Prior to Admission Living Arrangement: [facility] Anticipated Discharge Location: [facility] Barriers to Discharge: AKI Resolution  Anticipated discharge in approximately 1-3  day(s).   Subjective:  Interviewed patient at bedside.  He was in a very pleasant mood. He was resting comfortably. He reports no pain and no complaints. He was very thankful for the care he was receiving.   Objective: Temp:  [98.5 F (36.9 C)-98.8 F (37.1 C)] 98.7 F (37.1 C) (09/27 0417) Pulse Rate:  [65-90] 79 (09/27 0417) Resp:  [13-20] 18 (09/27 0417) BP: (126-173)/(75-94) 126/77 (09/27 0417) SpO2:  [100 %] 100 % (09/27 0417) Weight:  [65 kg] 65 kg (09/26 1422) Physical Exam:  Physical Exam Vitals and nursing note reviewed.  Constitutional:      General: He is not in acute distress.    Appearance: Normal appearance. He is not ill-appearing, toxic-appearing or diaphoretic.  HENT:     Head: Normocephalic and atraumatic.  Cardiovascular:     Rate and Rhythm: Normal rate and regular rhythm.     Pulses: Normal pulses.          Radial pulses are 2+ on the right side and 2+ on the left side.       Dorsalis pedis pulses are 2+ on the right side and 2+ on the left side.     Heart sounds: Normal heart sounds, S1 normal and S2 normal. No murmur heard.   Pulmonary:     Effort: Pulmonary effort is normal. No respiratory distress.     Breath sounds: Normal breath sounds. No wheezing.  Abdominal:     General: There is no distension.     Tenderness: There is no abdominal tenderness. There is no guarding.  Musculoskeletal:     Right lower leg: No edema.     Left lower leg: No edema.  Neurological:     Mental Status: He is alert. Mental status is at baseline.  Psychiatric:        Attention and Perception: Attention normal.        Mood and Affect: Mood and affect normal.        Behavior: Behavior is cooperative.      Laboratory: Recent Labs  Lab 05/12/20 1231  WBC 6.8  HGB 10.7*  HCT 33.4*  PLT 214   Recent Labs  Lab 05/12/20 1231  NA 142  K 4.2  CL 102  CO2 29  BUN 37*  CREATININE 2.38*  CALCIUM 9.5  PROT 7.1  BILITOT 0.5  ALKPHOS 44  ALT 23  AST 25   GLUCOSE 95    Urinalysis    Component Value Date/Time   COLORURINE YELLOW 05/12/2020 1403   APPEARANCEUR CLEAR 05/12/2020 1403   LABSPEC 1.015 05/12/2020 1403   PHURINE 5.0 05/12/2020 1403   GLUCOSEU NEGATIVE 05/12/2020 1403   HGBUR NEGATIVE 05/12/2020 1403   HGBUR negative 08/27/2010 0854   BILIRUBINUR NEGATIVE 05/12/2020 1403   BILIRUBINUR negative 08/02/2018 1555   BILIRUBINUR NEG 07/27/2014 1518   KETONESUR NEGATIVE 05/12/2020 1403   PROTEINUR NEGATIVE 05/12/2020 1403   UROBILINOGEN 0.2 08/02/2018 1555   UROBILINOGEN 0.2 08/27/2010 0854   NITRITE NEGATIVE 05/12/2020 1403   LEUKOCYTESUR NEGATIVE 05/12/2020 1403   Drugs of Abuse     Component Value Date/Time   LABOPIA NONE DETECTED 05/12/2020 1418   COCAINSCRNUR NONE DETECTED 05/12/2020 1418   LABBENZ NONE DETECTED 05/12/2020 1418   AMPHETMU NONE DETECTED 05/12/2020 1418   THCU NONE  DETECTED 05/12/2020 1418   LABBARB NONE DETECTED 05/12/2020 1418     Ammonia: 26 (WNL) Ethanol: < 10  (WNL)   Imaging/Diagnostic Tests:   CT Cervical Spine WO Contrast:No evidence of acute fracture or traumatic malalignment.   CT Head WO Contrast: No evidence of acute intracranial abnormality. Similar age advanced chronic microvascular ischemic disease, generalized atrophy, and remote left parietal cortical infarct.   DG Chest Port 1 View: No active disease.    Briant Cedar, MD 05/13/2020, 6:04 AM PGY-1, [place] Intern pager: [phone], text pages welcome

## 2020-05-14 ENCOUNTER — Inpatient Hospital Stay (HOSPITAL_COMMUNITY): Payer: Medicare Other

## 2020-05-14 DIAGNOSIS — R401 Stupor: Secondary | ICD-10-CM | POA: Diagnosis not present

## 2020-05-14 DIAGNOSIS — N179 Acute kidney failure, unspecified: Secondary | ICD-10-CM | POA: Diagnosis not present

## 2020-05-14 LAB — BASIC METABOLIC PANEL
Anion gap: 10 (ref 5–15)
BUN: 25 mg/dL — ABNORMAL HIGH (ref 8–23)
CO2: 26 mmol/L (ref 22–32)
Calcium: 9 mg/dL (ref 8.9–10.3)
Chloride: 104 mmol/L (ref 98–111)
Creatinine, Ser: 1.3 mg/dL — ABNORMAL HIGH (ref 0.61–1.24)
GFR calc Af Amer: >60 mL/min (ref >60)
GFR calc non Af Amer: 56 mL/min — ABNORMAL LOW (ref >60)
Glucose, Bld: 94 mg/dL (ref 70–99)
Potassium: 4.2 mmol/L (ref 3.5–5.1)
Sodium: 140 mmol/L (ref 135–145)

## 2020-05-14 NOTE — Progress Notes (Signed)
Patient is 5 days status post open reduction internal fixation of a right foot metatarsal fractures.  Patient is supposed to be nonweightbearing but apparently has been weightbearing.  Focused examination dressing was removed incision is healing well swelling is well controlled.  No necrosis minimal bloody drainage no cellulitis.  Status post above.  Put in orders for nonweightbearing on right foot.  Also x-ray of right foot today.  May begin dressing changes and daily cleansing will need follow-up in our office 1 week after discharge

## 2020-05-14 NOTE — Progress Notes (Signed)
Family Medicine Teaching Service Daily Progress Note Intern Pager: [phone]  Patient name: [name] Medical record number: [id number] Date of birth: February 09, 1951 Age: 69 y.o. Gender: male  Primary Care Provider: Danna Hefty, DO Consultants: None Code Status: Full  Pt Overview and Major Events to Date:  Admitted on 9/26  Assessment and Plan:  [name] a 69 y.o.malepresenting with acute kidney injury. PMH is significant for recent right foot crush injury s/p ORIF, primary schizophrenia, HFpEF,HTN, HLD, BPH, tobacco abuse, GERD, history of hep C s/p Harvoni, history of hep B cleared   AKI in the setting of CKD stage IIIa Patient creatinine on admission was 2.38(baseline 1.3),BUN 37.In the ED, patient given 500 mL NS bolus. AKI likely due to prerenal causes, more specifically dehydration. Patient was recently discharged from the hospital on 9/23 to [facility] and has been missing from the facility since 4 PM on 9/25. Patient has a history of decreased p.o. intake when not prompted. Creatinine yesterday afternoon was 1.43, he is returning towards baseline. TOChas been consulted for disposition -Vitals per routine -AMBMP -Follow-up urine culture -Appreciate TOCassistance    Schizophrenia Patient arrived via [facility] after being found lying balance at her [address] stating "I was just taking a catnap", EMS placed c-collar on patient. Patient was thought to be encephalopathic on arrival to the ED as hehas baseline wandering speech with echolalia and difficult to understand speech.In the ED patient was worked up for altered mental status labs within normal limit for alcohol and ammonia, negative UDS, unremarkable UA,normal CXR.CT head: No evidence of acute intracranial abnormality, similar age advanced chronic microvascular ischemic disease and generalized atrophy, and remote left parietal cortical infarct.Home medications include Vistaril 50 mgQHS,  Prolixin 25 mg injectionq14days(next dose due 10/7),benztropine1 mg BID. He is in his normal state of mood.  -Continue home benztropine -Consider adding Vistaril as needed for agitation. -Continue to monitor   Right foot fractures s/p ORIF [name] performed ORIF 9/14. Patient was to be non-weightbearingbut has been up and walking around in his walking boot. Surgical site dressing is clean, dry, well-healing. He reports no pain today. Ortho PA saw patient this morning and thought foot was healing well. Ordered repeat foot x-ray and recommended follow up one week after discharge. -Wound care nursing  -Follow up foot xray -PT/OT eval and treat -Tylenol as needed for pain -Up with assistance   HTN Blood pressure this morning was 126/77.Home medications include Norvasc, Cozaar. During last admission, patient was normotensive throughout and the patient reported he was not taking his daily medications. -Continue to monitor BP -Hold home Cozaar,reassess for need later -Continue Norvasc 5 mg   Normocytic anemia, chronic, stable Patient was admitted with a Hgb of10.7, hemoglobin today is stable at 10.7,with a baseline Hgb 10-11.During last admission, patient's CBC was trended and stable during entire admission. Recommend outpatient follow-up, to include colonoscopy. -Continue to trendwith CBCs every few days -Continue folic acid 1 mg daily   BPH Chronic and stable. Medications include Flomax0.8 mg daily. -Continue home Flomax   Nicotine dependence Patient has history of nicotine dependence, on previous admission was on nicotine patch consistently. -Continuenicotine patch   Neck pain, resolved CT Spine shows no acute fracture. Has no complaints of pain today. -Continue to monitor   FEN/GI: Health diet PPx: Lovenox  Disposition: Med-Surg  Prior to Admission Living Arrangement: [facility] Anticipated Discharge Location: [facility] Barriers to Discharge: AKI  resolution Anticipated discharge in approximately 1-2  day(s).   Subjective:  Interviewed patient at bedside with sitter in the room.  He is in a pleasant, cooperative mood. He reports feeling well today. He reports that his foot does not hurt. He has no complaints.  Objective: Temp:  [97.6 F (36.4 C)-98.6 F (37 C)] 97.6 F (36.4 C) (09/28 0300) Pulse Rate:  [61-95] 61 (09/28 0300) Resp:  [17-18] 17 (09/28 0300) BP: (130-150)/(70-97) 146/74 (09/28 0300) SpO2:  [100 %] 100 % (09/28 0300) Physical Exam:  Physical Exam Vitals and nursing note reviewed.  Constitutional:      General: He is not in acute distress.    Appearance: He is not ill-appearing, toxic-appearing or diaphoretic.  HENT:     Head: Normocephalic and atraumatic.  Cardiovascular:     Rate and Rhythm: Normal rate and regular rhythm.     Pulses: Normal pulses.          Radial pulses are 2+ on the right side and 2+ on the left side.       Dorsalis pedis pulses are 2+ on the right side and 2+ on the left side.     Heart sounds: Normal heart sounds, S1 normal and S2 normal. No murmur heard.   Pulmonary:     Effort: Pulmonary effort is normal. No respiratory distress.     Breath sounds: Normal breath sounds. No wheezing.  Abdominal:     General: There is no distension.     Tenderness: There is no abdominal tenderness.  Musculoskeletal:     Right lower leg: No edema.     Left lower leg: No edema.  Neurological:     Mental Status: He is alert. Mental status is at baseline.      Laboratory: Recent Labs  Lab 05/12/20 1231  WBC 6.8  HGB 10.7*  HCT 33.4*  PLT 214   Recent Labs  Lab 05/12/20 1231 05/13/20 1505  NA 142 140  K 4.2 4.5  CL 102 106  CO2 29 25  BUN 37* 26*  CREATININE 2.38* 1.43*  CALCIUM 9.5 8.6*  PROT 7.1  --   BILITOT 0.5  --   ALKPHOS 44  --   ALT 23  --   AST 25  --   GLUCOSE 95 94     Imaging/Diagnostic Tests:   CT Cervical Spine WO Contrast:No evidence of acute fracture  or traumatic malalignment.   CT Head WO Contrast: No evidence of acute intracranial abnormality. Similar age advanced chronic microvascular ischemic disease, generalized atrophy, and remote left parietal cortical infarct.   DG Chest Port 1 View: No active disease.   Briant Cedar, MD 05/14/2020, 5:49 AM PGY-1, [place] Intern pager: [phone], text pages welcome

## 2020-05-15 DIAGNOSIS — N179 Acute kidney failure, unspecified: Secondary | ICD-10-CM | POA: Diagnosis not present

## 2020-05-15 MED ORDER — FLUPHENAZINE DECANOATE 25 MG/ML IJ SOLN: 25.0000 mg | INTRAMUSCULAR | Status: DC

## 2020-05-15 NOTE — Plan of Care (Signed)

## 2020-05-15 NOTE — Progress Notes (Signed)
Family Medicine Teaching Service Daily Progress Note Intern Pager: [phone]  Patient name: [name] Medical record number: [id number] Date of birth: November 11, 1950 Age: 69 y.o. Gender: male  Primary Care Provider: Danna Hefty, DO Consultants: None Code Status: Full  Pt Overview and Major Events to Date:  Admitted on 9/26  Assessment and Plan:  [name] a 69 y.o.malepresenting with acute kidney injury. PMH is significant for recent right foot crush injury s/p ORIF, primary schizophrenia, HFpEF,HTN, HLD, BPH, tobacco abuse, GERD, history of hep C s/p Harvoni, history of hep B cleared   AKI in the setting of CKD stage IIIa (resolved): Marland Kitchen Creatinine yesterday afternoon was 1.30  he has returned to his baseline. TOChas been consultedfor disposition -Vitals per routine -Will get AMBMP tomorrow -Follow-up urine culture -AppreciateTOCassistance   Schizophrenia Chronic, stable.Home medications include Vistaril 50 mgQHS, Prolixin 25 mg injectionq14days(next dose due 10/7),benztropine1 mg BID. He is in his normal state of mood. -Continue home benztropine -Consider adding Vistaril as needed for agitation. -Continue to monitor   Right foot fractures s/p ORIF Dr. Sharol Lowe performed ORIF 9/14. Patient was to be non-weightbearingbut has been up and walking around in his walking boot. Repeat X-ray showed all fractures healing well and screw was still well placed. He reports no pain today. -Wound care nursing  -PT/OT eval and treat -Tylenol as needed for pain -Up with assistance   HTN Blood pressurethis morning was 133/84.Home medications include Norvasc, Cozaar. During last admission, patient was normotensive throughout and the patient reported he was not taking his daily medications. Due to remaining normotensive will discontinue Cozaar. -Continue to monitor BP -Discontinue Cozaar -ContinueNorvasc 5 mg   Normocytic anemia, chronic,  stable Patient was admitted with a Hgb of10.7, hemoglobintoday isstable at10.7,with a baseline Hgb 10-11.During last admission, patient's CBC was trended and stable during entire admission. Recommend outpatient follow-up, to include colonoscopy. -Continue to trendwith CBCs every few days -Continue folic acid 1 mg daily -Will get CBC tomorrow AM   BPH Chronic and stable. Medications include Flomax0.8 mg daily. -Continue home Flomax   Nicotine dependence Patient has history of nicotine dependence, on previous admission was on nicotine patch consistently. -Continuenicotine patch   Neck pain, resolved CT Spine shows no acute fracture.Has no complaints of pain today. -Continue to monitor   FEN/GI: Healthy diet PPx: Lovenox  Disposition: Med-Surg, Medically stable for discharge  Prior to Admission Living Arrangement: [facility] Anticipated Discharge Location: SNF Memory Unit Barriers to Discharge: Medically stable for discharge Anticipated discharge in approximately 0-2 day(s).   Subjective:  Interviewed patient at bedside.  He is thankful for the care he is receiving and in good spirits.He reports no pain today, reports his foot is not hurting. He has no complaints at this time.  Objective: Temp:  [98.1 F (36.7 C)-99.3 F (37.4 C)] 98.1 F (36.7 C) (09/29 0351) Pulse Rate:  [57-63] 57 (09/29 0351) Resp:  [18] 18 (09/29 0351) BP: (106-132)/(66-92) 121/76 (09/29 0351) SpO2:  [100 %] 100 % (09/29 0351) Physical Exam:  Physical Exam Vitals and nursing note reviewed.  Constitutional:      General: He is not in acute distress.    Appearance: Normal appearance. He is not ill-appearing, toxic-appearing or diaphoretic.  HENT:     Head: Normocephalic and atraumatic.  Cardiovascular:     Rate and Rhythm: Normal rate and regular rhythm.     Pulses: Normal pulses.          Radial pulses are 2+ on the  right side and 2+ on the left side.       Dorsalis pedis  pulses are 2+ on the right side and 2+ on the left side.     Heart sounds: Normal heart sounds, S1 normal and S2 normal.  Pulmonary:     Effort: Pulmonary effort is normal. No respiratory distress.     Breath sounds: Normal breath sounds. No wheezing.  Abdominal:     General: There is no distension.     Palpations: Abdomen is soft.     Tenderness: There is no abdominal tenderness.  Musculoskeletal:     Right lower leg: No edema.     Left lower leg: No edema.  Neurological:     Mental Status: He is alert. Mental status is at baseline.      Laboratory: Recent Labs  Lab 05/12/20 1231  WBC 6.8  HGB 10.7*  HCT 33.4*  PLT 214   Recent Labs  Lab 05/12/20 1231 05/13/20 1505 05/14/20 1217  NA 142 140 140  K 4.2 4.5 4.2  CL 102 106 104  CO2 29 25 26   BUN 37* 26* 25*  CREATININE 2.38* 1.43* 1.30*  CALCIUM 9.5 8.6* 9.0  PROT 7.1  --   --   BILITOT 0.5  --   --   ALKPHOS 44  --   --   ALT 23  --   --   AST 25  --   --   GLUCOSE 95 94 94      Imaging/Diagnostic Tests:   CT Cervical Spine WO Contrast:No evidence of acute fracture or traumatic malalignment.   CT Head WO Contrast:No evidence of acute intracranial abnormality. Similar age advanced chronic microvascular ischemic disease, generalized atrophy, and remote left parietal cortical infarct.   DG Chest Port 1 View:No active disease.   Corey Cedar, MD 05/15/2020, 6:00 AM PGY-1, [name] Intern pager: [phone], text pages welcome

## 2020-05-15 NOTE — NC FL2 (Signed)
[place] LEVEL OF CARE SCREENING TOOL     IDENTIFICATION  Patient Name: [name] Birthdate: 1951/02/18 Sex: male Admission Date (Current Location): 05/12/2020  [facility] and [place] Number:  Herbalist and Address:  [facility], [place] [address], [place], [place] [address]      Provider Number: [id number]  Attending Physician Name and Address:  Dickie La, MD  Relative Name and Phone Number:       Current Level of Care: Hospital Recommended Level of Care: [place] Prior Approval Number:    Date Approved/Denied:   PASRR Number:    Discharge Plan: SNF    Current Diagnoses: Patient Active Problem List   Diagnosis Date Noted  . AKI (acute kidney injury) ([place]) 05/12/2020  . Lisfranc dislocation, right, initial encounter   . Closed displaced fracture of fifth metatarsal bone of right foot   . History of hepatitis B 04/26/2020  . Chronic hepatitis ([place])   . Crush injury   . Dementia without behavioral disturbance ([place])   . Fever of unknown origin   . Fever 04/24/2020  . Atrial fibrillation with RVR ([place]) 04/24/2020  . Malnutrition of moderate degree 04/24/2020  . Acute metabolic encephalopathy 25/85/2778  . SIRS (systemic inflammatory response syndrome) ([place]) 04/23/2020  . Acute encephalopathy 04/23/2020  . GERD (gastroesophageal reflux disease) 05/03/2019  . Hypotension 08/02/2018  . Low back sprain, initial encounter 10/21/2017  . History of drug abuse ([place]) 10/18/2017  . Gingival erythema 09/23/2017  . Chronic pain of left knee 11/24/2016  . Tobacco abuse   . Diastolic heart failure ([place]) 02/05/2015  . Healthcare maintenance 05/28/2014  . Hyperlipidemia 03/10/2014  . Patellar tendonitis 11/16/2012  . [place] OCCLUSION 06/03/2010  . PARANOID SCHIZOPHRENIA, CHRONIC 01/17/2009  . HYPERTENSION, BENIGN ESSENTIAL 01/17/2009  . BPH (benign prostatic hyperplasia) 01/17/2009  . History of  hepatitis C 12/14/2008  . DEPRESSION 12/14/2008    Orientation RESPIRATION BLADDER Height & Weight     Self, Place  O2 (2L ) Incontinent Weight: 143 lb 4.8 oz (65 kg) Height:  5\' 9"  (175.3 cm)  BEHAVIORAL SYMPTOMS/MOOD NEUROLOGICAL BOWEL NUTRITION STATUS      Continent    AMBULATORY STATUS COMMUNICATION OF NEEDS Skin   Limited Assist Verbally Other (Comment) (See [facility] notes, surgical)                       Personal Care Assistance Level of Assistance  Bathing, Dressing Bathing Assistance: Limited assistance   Dressing Assistance: Limited assistance     Functional Limitations Info  Sight Sight Info: Impaired        SPECIAL CARE FACTORS FREQUENCY  PT (By licensed PT), OT (By licensed OT)                    Contractures      Additional Factors Info  Code Status Code Status Info: FULL CODE             Current Medications (05/15/2020):  This is the current hospital active medication list Current Facility-Administered Medications  Medication Dose Route Frequency Provider Last Rate Last Admin  . 0.9 %  sodium chloride infusion  250 mL Intravenous PRN Lilland, Alana, DO 10 mL/hr at 05/13/20 0952 250 mL at 05/13/20 0952  . acetaminophen (TYLENOL) tablet 650 mg  650 mg Oral Q6H PRN Lilland, Alana, DO       Or  . acetaminophen (TYLENOL)  suppository 650 mg  650 mg Rectal Q6H PRN Lilland, Alana, DO      . amLODipine (NORVASC) tablet 5 mg  5 mg Oral Daily Lilland, Alana, DO   5 mg at 05/15/20 0908  . ascorbic acid (VITAMIN C) tablet 500 mg  500 mg Oral Daily Lilland, Alana, DO   500 mg at 05/15/20 0909  . benztropine (COGENTIN) tablet 1 mg  1 mg Oral BID Lilland, Alana, DO   1 mg at 05/15/20 0909  . enoxaparin (LOVENOX) injection 40 mg  40 mg Subcutaneous Q24H Lyndee Leo, RPH   40 mg at 05/14/20 2223  . [START ON 05/23/2020] fluPHENAZine decanoate (PROLIXIN) injection 25 mg  25 mg Intramuscular Q14 Days Briant Cedar, MD      . folic acid (FOLVITE)  tablet 1 mg  1 mg Oral Daily Lilland, Alana, DO   1 mg at 05/15/20 0909  . multivitamin with minerals tablet 1 tablet  1 tablet Oral Daily Lilland, Alana, DO   1 tablet at 05/15/20 0909  . nicotine (NICODERM CQ - dosed in mg/24 hours) patch 14 mg  14 mg Transdermal Daily Lilland, Alana, DO   14 mg at 05/15/20 0913  . sodium chloride flush (NS) 0.9 % injection 3 mL  3 mL Intravenous Q12H Lilland, Alana, DO   3 mL at 05/15/20 0913  . sodium chloride flush (NS) 0.9 % injection 3 mL  3 mL Intravenous PRN Lilland, Alana, DO      . tamsulosin (FLOMAX) capsule 0.8 mg  0.8 mg Oral Daily Lilland, Alana, DO   0.8 mg at 05/15/20 0909  . thiamine tablet 100 mg  100 mg Oral Daily Lilland, Alana, DO   100 mg at 05/15/20 0909     Discharge Medications: Please see discharge summary for a list of discharge medications.  Relevant Imaging Results:  Relevant Lab Results:   Additional Information SSN: [id number]  Amador Cunas, [place]

## 2020-05-16 ENCOUNTER — Encounter (HOSPITAL_COMMUNITY): Payer: Self-pay | Admitting: Family Medicine

## 2020-05-16 ENCOUNTER — Telehealth (HOSPITAL_COMMUNITY): Payer: Self-pay | Admitting: *Deleted

## 2020-05-16 DIAGNOSIS — R401 Stupor: Secondary | ICD-10-CM | POA: Diagnosis not present

## 2020-05-16 DIAGNOSIS — M79671 Pain in right foot: Secondary | ICD-10-CM

## 2020-05-16 DIAGNOSIS — N179 Acute kidney failure, unspecified: Secondary | ICD-10-CM | POA: Diagnosis not present

## 2020-05-16 LAB — CBC
HCT: 36.6 % — ABNORMAL LOW (ref 39.0–52.0)
Hemoglobin: 11.9 g/dL — ABNORMAL LOW (ref 13.0–17.0)
MCH: 29.6 pg (ref 26.0–34.0)
MCHC: 32.5 g/dL (ref 30.0–36.0)
MCV: 91 fL (ref 80.0–100.0)
Platelets: 166 10*3/uL (ref 150–400)
RBC: 4.02 MIL/uL — ABNORMAL LOW (ref 4.22–5.81)
RDW: 13.7 % (ref 11.5–15.5)
WBC: 3.8 10*3/uL — ABNORMAL LOW (ref 4.0–10.5)
nRBC: 0 % (ref 0.0–0.2)

## 2020-05-16 LAB — BASIC METABOLIC PANEL
Anion gap: 11 (ref 5–15)
BUN: 23 mg/dL (ref 8–23)
CO2: 25 mmol/L (ref 22–32)
Calcium: 9.3 mg/dL (ref 8.9–10.3)
Chloride: 102 mmol/L (ref 98–111)
Creatinine, Ser: 1.46 mg/dL — ABNORMAL HIGH (ref 0.61–1.24)
GFR calc Af Amer: 56 mL/min — ABNORMAL LOW (ref >60)
GFR calc non Af Amer: 49 mL/min — ABNORMAL LOW (ref >60)
Glucose, Bld: 108 mg/dL — ABNORMAL HIGH (ref 70–99)
Potassium: 4.3 mmol/L (ref 3.5–5.1)
Sodium: 138 mmol/L (ref 135–145)

## 2020-05-16 LAB — SARS CORONAVIRUS 2 BY RT PCR (HOSPITAL ORDER, PERFORMED IN ~~LOC~~ HOSPITAL LAB): SARS Coronavirus 2: NEGATIVE

## 2020-05-16 NOTE — NC FL2 (Signed)
[place] LEVEL OF CARE SCREENING TOOL     IDENTIFICATION  Patient Name: [name] Birthdate: [birth year] Sex: male Admission Date (Current Location): 05/12/2020  [facility] and [place] Number:  Herbalist and Address:  [facility], [place] [address], [place], [place] [address]      Provider Number: [id number]  Attending Physician Name and Address:  Leeanne Rio, MD  Relative Name and Phone Number:       Current Level of Care: Hospital Recommended Level of Care: [place] Prior Approval Number:    Date Approved/Denied:   PASRR Number:    Discharge Plan: Other (Comment) (ALF)    Current Diagnoses: Patient Active Problem List   Diagnosis Date Noted  . AKI (acute kidney injury) ([place]) 05/12/2020  . Lisfranc dislocation, right, initial encounter   . Closed displaced fracture of fifth metatarsal bone of right foot   . History of hepatitis B 04/26/2020  . Chronic hepatitis ([place])   . Crush injury   . Dementia without behavioral disturbance ([place])   . Fever of unknown origin   . Fever 04/24/2020  . Atrial fibrillation with RVR ([place]) 04/24/2020  . Malnutrition of moderate degree 04/24/2020  . Acute metabolic encephalopathy 56/21/3086  . SIRS (systemic inflammatory response syndrome) ([place]) 04/23/2020  . Acute encephalopathy 04/23/2020  . GERD (gastroesophageal reflux disease) 05/03/2019  . Hypotension 08/02/2018  . Low back sprain, initial encounter 10/21/2017  . History of drug abuse ([place]) 10/18/2017  . Gingival erythema 09/23/2017  . Chronic pain of left knee 11/24/2016  . Tobacco abuse   . Diastolic heart failure ([place]) 02/05/2015  . Healthcare maintenance 05/28/2014  . Hyperlipidemia 03/10/2014  . Patellar tendonitis 11/16/2012  . [place] OCCLUSION 06/03/2010  . PARANOID SCHIZOPHRENIA, CHRONIC 01/17/2009  . HYPERTENSION, BENIGN ESSENTIAL 01/17/2009  . BPH (benign prostatic hyperplasia)  01/17/2009  . History of hepatitis C 12/14/2008  . DEPRESSION 12/14/2008    Orientation RESPIRATION BLADDER Height & Weight     Self  Normal Continent Weight: 143 lb 4.8 oz (65 kg) Height:  5\' 9"  (175.3 cm)  BEHAVIORAL SYMPTOMS/MOOD NEUROLOGICAL BOWEL NUTRITION STATUS  Wande[place]   Continent Diet (Regular)  AMBULATORY STATUS COMMUNICATION OF NEEDS Skin   Limited Assist Verbally Surgical wounds                       Personal Care Assistance Level of Assistance  Bathing, Feeding, Dressing Bathing Assistance: Limited assistance Feeding assistance: Independent Dressing Assistance: Independent     Functional Limitations Info  Sight Sight Info: Impaired Hearing Info: Adequate Speech Info: Adequate    SPECIAL CARE FACTORS FREQUENCY  PT (By licensed PT), OT (By licensed OT)     PT Frequency: 2x/week minimum OT Frequency: 2x/week minimum            Contractures Contractures Info: Not present    Additional Factors Info  Code Status, Allergies Code Status Info: FULL CODE Allergies Info: Allergies Info: Penicillian, amoxicillian, ibprophen, lisinopril           Current Medications (05/16/2020):  This is the current hospital active medication list Current Facility-Administered Medications  Medication Dose Route Frequency Provider Last Rate Last Admin  . 0.9 %  sodium chloride infusion  250 mL Intravenous PRN Lilland, Alana, DO 10 mL/hr at 05/13/20 0952 250 mL at 05/13/20 0952  . acetaminophen (TYLENOL) tablet 650 mg  650 mg Oral Q6H PRN Lilland, Alana, DO  650 mg at 05/16/20 0818   Or  . acetaminophen (TYLENOL) suppository 650 mg  650 mg Rectal Q6H PRN Lilland, Alana, DO      . amLODipine (NORVASC) tablet 5 mg  5 mg Oral Daily Lilland, Alana, DO   5 mg at 05/16/20 0819  . ascorbic acid (VITAMIN C) tablet 500 mg  500 mg Oral Daily Lilland, Alana, DO   500 mg at 05/16/20 0819  . benztropine (COGENTIN) tablet 1 mg  1 mg Oral BID Lilland, Alana, DO   1 mg at 05/16/20  0826  . enoxaparin (LOVENOX) injection 40 mg  40 mg Subcutaneous Q24H Lyndee Leo, RPH   40 mg at 05/15/20 2158  . [START ON 05/23/2020] fluPHENAZine decanoate (PROLIXIN) injection 25 mg  25 mg Intramuscular Q14 Days Briant Cedar, MD      . folic acid (FOLVITE) tablet 1 mg  1 mg Oral Daily Lilland, Alana, DO   1 mg at 05/16/20 0820  . multivitamin with minerals tablet 1 tablet  1 tablet Oral Daily Lilland, Alana, DO   1 tablet at 05/16/20 0821  . nicotine (NICODERM CQ - dosed in mg/24 hours) patch 14 mg  14 mg Transdermal Daily Lilland, Alana, DO   14 mg at 05/16/20 9476  . sodium chloride flush (NS) 0.9 % injection 3 mL  3 mL Intravenous Q12H Lilland, Alana, DO   3 mL at 05/16/20 0821  . sodium chloride flush (NS) 0.9 % injection 3 mL  3 mL Intravenous PRN Lilland, Alana, DO      . tamsulosin (FLOMAX) capsule 0.8 mg  0.8 mg Oral Daily Lilland, Alana, DO   0.8 mg at 05/16/20 5465  . thiamine tablet 100 mg  100 mg Oral Daily Lilland, Alana, DO   100 mg at 05/16/20 0354     Discharge Medications: Please see discharge summary for a list of discharge medications.  Relevant Imaging Results:  Relevant Lab Results:   Additional Information SSN: [id number]   Discharge medications:  TAKE these medications   acetaminophen 325 MG tablet Commonly known as: TYLENOL Take 2 tablets (650 mg total) by mouth every 6 (six) hours as needed for mild pain, moderate pain or headache.   benztropine 1 MG tablet Commonly known as: COGENTIN Take 1 tablet (1 mg total) by mouth 2 (two) times daily. What changed: Another medication with the same name was removed. Continue taking this medication, and follow the directions you see here.   cetirizine 10 MG tablet Commonly known as: ZyrTEC Allergy Take 1 tablet (10 mg total) by mouth daily.   fluPHENAZine decanoate 25 MG/ML injection Commonly known as: PROLIXIN Inject 1 mL (25 mg total) into the muscle every 14 (fourteen) days. Last dose  7/00/17   folic acid 1 MG tablet Commonly known as: FOLVITE Take 1 tablet (1 mg total) by mouth daily.   multivitamin with minerals Tabs tablet Take 1 tablet by mouth daily.   nicotine 14 mg/24hr patch Commonly known as: NICODERM CQ - dosed in mg/24 hours Place 1 patch (14 mg total) onto the skin daily.   tamsulosin 0.4 MG Caps capsule Commonly known as: FLOMAX TAKE 2 CAPSULE BY MOUTH DAILY What changed: See the new instructions.   thiamine 100 MG tablet Take 1 tablet (100 mg total) by mouth daily.   vitamin C 500 MG tablet Commonly known as: ASCORBIC ACID Take 500 mg by mouth daily.          Amador Cunas, [place]

## 2020-05-16 NOTE — Plan of Care (Signed)

## 2020-05-16 NOTE — Progress Notes (Signed)
[facility] arrived to take patient to [facility].  Patient is alert and verbal.  No pain meds needed before transport.  2 Iv's removed.  Patient's spouse will meet him there at the facility.

## 2020-05-16 NOTE — Progress Notes (Addendum)
Family Medicine Teaching Service Daily Progress Note Intern Pager: [phone]  Patient name: [name] Medical record number: [id number] Date of birth: 08-Nov-1950 Age: 69 y.o. Gender: male  Primary Care Provider: Danna Hefty, DO Consultants: None Code Status: Full  Pt Overview and Major Events to Date:  Admitted on 9/26  Assessment and Plan:  DALANTE MINUS a 69 y.o.malepresenting with acute kidney injury. PMH is significant for recent right foot crush injury s/p ORIF, primary schizophrenia, HFpEF,HTN, HLD, BPH, tobacco abuse, GERD, history of hep C s/p Harvoni, history of hep B cleared   AKI in the setting of CKD stage IIIa (resolved): Baseline Creatinine is around 1.3. Creatinine on 9/29 was 1.30. He has retturned to his baseline. TOChas been consultedfor disposition -Vitals per routine -AppreciateTOCassistance   Schizophrenia Chronic, stable.Home medications include Vistaril 50 mgQHS, Prolixin 25 mg injectionq14days(next dose due 10/7),benztropine1 mg BID. He is in his normal state of mood.Attempting placement at [facility] to better provide care for him. TOC is working on placement. -Continue home benztropine -Consider adding Vistaril as needed for agitation. -Continue to monitor   Right foot fractures s/p ORIF [name] performed ORIF 9/14. Patient was to be non-weightbearingbut has been up and walking around in his walking boot. Repeat X-ray 9/28 showed all fractures healing well and screw was still well placed. Reported some pain today and was given Tylenol. -Wound care nursing -PT/OT eval and treat -Tylenol as needed for pain -Up with assistance   HTN Blood pressurethis morning was 133/84.Home medications include Norvasc, Cozaar. During last admission, patient was normotensive throughout and the patient reported he was not taking his daily medications. Due to remaining normotensive and to simplify medication for him to increase  compliance will discontinue Cozaar. -Continue to monitor BP -Discontinue Cozaar -ContinueNorvasc 5 mg   Normocytic anemia, chronic, stable Patient was admitted with a Hgb of10.7,with a baseline Hgb 10-11.During last admission, patient's CBC was trended and stable during entire admission. Recommend outpatient follow-up, to include colonoscopy. Labs have not yet been drawn today. -Continue to trendwith CBCs every few days -Continue folic acid 1 mg daily   BPH Chronic and stable. Medications include Flomax0.8 mg daily. -Continue home Flomax   Nicotine dependence Patient has history of nicotine dependence, on previous admission was on nicotine patch consistently. -Continuenicotine patch   Neck pain, resolved CT Spine shows no acute fracture.Has no complaints of pain since admission. -Continue to monitor    FEN/GI: Healthy Diet PPx: Lovenox  Disposition: Med-Surg Medically Stable for Discharge  Prior to Admission Living Arrangement: [facility] Anticipated Discharge Location: [facility] Barriers to Discharge: None, Medically stable for Discharge Anticipated discharge in approximately 0-2 day(s).   Subjective:  Interviewed patient at bedside.  He reported some pain in his right foot. Contacted nurse to provide him with Tylenol. He had no other complaints. Asked when he would be able to leave. Discussed that SNF placement was being worked on.  Objective: Temp:  [97.7 F (36.5 C)-98.7 F (37.1 C)] 97.7 F (36.5 C) (09/29 1939) Pulse Rate:  [58-89] 75 (09/29 1939) Resp:  [16-18] 18 (09/29 1939) BP: (132-138)/(84-90) 138/90 (09/29 1939) SpO2:  [99 %-100 %] 100 % (09/29 1939) Physical Exam:  Physical Exam Vitals and nursing note reviewed.  Constitutional:      General: He is not in acute distress.    Appearance: Normal appearance. He is not ill-appearing, toxic-appearing or diaphoretic.  HENT:     Head: Normocephalic and atraumatic.  Cardiovascular:  Rate and Rhythm: Normal rate and regular rhythm.     Pulses: Normal pulses.          Radial pulses are 2+ on the right side and 2+ on the left side.       Dorsalis pedis pulses are 2+ on the right side and 2+ on the left side.     Heart sounds: Normal heart sounds, S1 normal and S2 normal. No murmur heard.   Pulmonary:     Effort: Pulmonary effort is normal. No respiratory distress.     Breath sounds: Normal breath sounds. No wheezing.  Abdominal:     General: There is no distension.     Tenderness: There is no abdominal tenderness.  Musculoskeletal:     Right lower leg: No edema.     Left lower leg: No edema.  Neurological:     Mental Status: He is alert. Mental status is at baseline.      Laboratory: Recent Labs  Lab 05/12/20 1231  WBC 6.8  HGB 10.7*  HCT 33.4*  PLT 214   Recent Labs  Lab 05/12/20 1231 05/13/20 1505 05/14/20 1217  NA 142 140 140  K 4.2 4.5 4.2  CL 102 106 104  CO2 29 25 26   BUN 37* 26* 25*  CREATININE 2.38* 1.43* 1.30*  CALCIUM 9.5 8.6* 9.0  PROT 7.1  --   --   BILITOT 0.5  --   --   ALKPHOS 44  --   --   ALT 23  --   --   AST 25  --   --   GLUCOSE 95 94 94     Imaging/Diagnostic Tests:   CT Cervical Spine WO Contrast:No evidence of acute fracture or traumatic malalignment.   CT Head WO Contrast:No evidence of acute intracranial abnormality. Similar age advanced chronic microvascular ischemic disease, generalized atrophy, and remote left parietal cortical infarct.   DG Chest Port 1 View:No active disease.   Briant Cedar, MD 05/16/2020, 6:01 AM PGY-1, [place] Intern pager: [phone], text pages welcome

## 2020-05-16 NOTE — Progress Notes (Signed)
Physical Therapy Treatment Patient Details Name: [name] MRN: [id number] DOB: 1951-05-31 Today's Date: 05/16/2020    History of Present Illness [name] is a 69 y.o. male presenting with acute kidney injury . PMH is significant for recent right foot crush injury s/p ORIF (dc on 9/23), primary schizophrenia, HFpEF, HTN, HLD, BPH, tobacco abuse, GERD, history of hep C s/p Harvoni    PT Comments    Pt supine on arrival, drowsy but easily awoken and agreeable to therapy session with good participation and tolerance for session. Pt remains confused, difficult to understand speech, but pleasant and participatory, however became more inappropriate toward male staff members toward end of session. Pt performed supine, seated and standing BLE therex with A/AAROM as detailed below with good tolerance. Pt instructed on NWB RLE precautions but needed manual assist to maintain RLE NWB status during functional transfers from bed>RW. Deferred gait trial 2/2 pt noncompliant with RLE NWB, confusion and inappropriate toward staff. Pt continues to benefit from PT services to progress toward functional mobility goals. Continue to recommend SNF level of rehab for pt safety.   Follow Up Recommendations  SNF;Supervision/Assistance - 24 hour     Equipment Recommendations  Wheelchair (measurements PT);Wheelchair cushion (measurements PT) ((if instead to facility, defer to next location))    Recommendations for Other Services       Precautions / Restrictions Precautions Precautions: Fall Precaution Comments: pt unable to follow NWB precautions Required Braces or Orthoses: Other Brace Other Brace: CAM boot RLE Restrictions Weight Bearing Restrictions: Yes RLE Weight Bearing: Non weight bearing Other Position/Activity Restrictions: has been non-compliant and walking in CAM boot    Mobility  Bed Mobility Overal bed mobility: Needs Assistance Bed Mobility: Supine to Sit     Supine to sit: Min  guard Sit to supine: Supervision   General bed mobility comments: needs cues for safety, pt impulsive  Transfers Overall transfer level: Needs assistance Equipment used: Rolling walker (2 wheeled) Transfers: Sit to/from Stand Sit to Stand: Min assist         General transfer comment: max cues for hand placement with poor carryover, manual assist to maintain RLE extended in front of body to prevent WB with standing  Ambulation/Gait Ambulation/Gait assistance:  (deferred for safety 2/2 pt behavior/inappropriately touching)               Stairs             Wheelchair Mobility    Modified Rankin (Stroke Patients Only)       Balance Overall balance assessment: Needs assistance Sitting-balance support: Feet supported;No upper extremity supported Sitting balance-Leahy Scale: Good Sitting balance - Comments: pt performed seated BLE therex with occasional posterior lean but no LOB, Supervision/min guard assist at most Postural control: Posterior lean (occasional) Standing balance support: During functional activity;Bilateral upper extremity supported Standing balance-Leahy Scale: Fair Standing balance comment: able to stand without support, but not supposed to ambulate at this time 2/2 noncompliant with NWB RLE restriction; manual assist to keep RLE advanced needed                            Cognition Arousal/Alertness: Awake/alert Behavior During Therapy: Impulsive;Restless Overall Cognitive Status: History of cognitive impairments - at baseline Area of Impairment: Attention;Following commands;Memory;Safety/judgement;Awareness;Problem solving                 Orientation Level: Situation;Time;Place;Disoriented to   Memory: Decreased short-term memory;Decreased recall of precautions Following  Commands: Follows one step commands inconsistently;Follows one step commands with increased time Safety/Judgement: Decreased awareness of safety;Decreased  awareness of deficits   Problem Solving: Difficulty sequencing;Requires verbal cues;Requires tactile cues;Slow processing General Comments: pt very pleasant, psych history, possibly at baseline. Inconsistent following of 1 step commands.      Exercises General Exercises - Lower Extremity Ankle Circles/Pumps: AROM;10 reps;Supine Long Arc Quad: Strengthening;Both;10 reps;Seated Heel Slides: AAROM;10 reps;Both;Supine;Strengthening Hip ABduction/ADduction: 10 reps;AAROM;Both;Supine;Strengthening Straight Leg Raises: AAROM;Strengthening;Both;10 reps;Supine Hip Flexion/Marching: AROM;Both;10 reps;Seated;Strengthening    General Comments General comments (skin integrity, edema, etc.): Reinforced NWB in boot to pt multiple times during the session, pt was unable to demonstrate functional understanding of NWB.       Pertinent Vitals/Pain Pain Assessment: Faces Faces Pain Scale: No hurt Pain Location: R foot Pain Intervention(s): Monitored during session    Home Living                      Prior Function            PT Goals (current goals can now be found in the care plan section) Acute Rehab PT Goals Patient Stated Goal: unable to state PT Goal Formulation: Patient unable to participate in goal setting Time For Goal Achievement: 05/27/20 Potential to Achieve Goals: Fair Progress towards PT goals: Progressing toward goals    Frequency    Min 2X/week      PT Plan Current plan remains appropriate    Co-evaluation              AM-PAC PT "6 Clicks" Mobility   Outcome Measure  Help needed turning from your back to your side while in a flat bed without using bedrails?: None Help needed moving from lying on your back to sitting on the side of a flat bed without using bedrails?: None Help needed moving to and from a bed to a chair (including a wheelchair)?: A Little Help needed standing up from a chair using your arms (e.g., wheelchair or bedside chair)?: A  Little Help needed to walk in hospital room?: A Lot Help needed climbing 3-5 steps with a railing? : Total 6 Click Score: 17    End of Session Equipment Utilized During Treatment: Gait belt Activity Tolerance: Patient tolerated treatment well Patient left: in bed;with call bell/phone within reach;with bed alarm set Nurse Communication: Mobility status;Other (comment) (pt inappropriate at times with male staff) PT Visit Diagnosis: Other abnormalities of gait and mobility (R26.89);Difficulty in walking, not elsewhere classified (R26.2)     Time: 1130-1145 PT Time Calculation (min) (ACUTE ONLY): 15 min  Charges:  $Therapeutic Exercise: 8-22 mins                     Charlesia Canaday P., PTA Acute Rehabilitation Services Pager: [phone] Office: [place] 05/16/2020, 12:13 PM

## 2020-05-16 NOTE — Telephone Encounter (Signed)
Checked in with [facility] if [name] had been discharged yesterday from the hospital and if he had how he was doing, Direce and myself where planning to make a home visit to see him today but will check in early next week to schedule a visit.

## 2020-05-16 NOTE — Discharge Summary (Addendum)
[facility] Discharge Summary  Patient name: [name] Medical record number: [id number] Date of birth: May 29, 1951 Age: 69 y.o. Gender: male Date of Admission: 05/12/2020  Date of Discharge: 05/16/2020 Admitting Physician: Gladys Damme, MD  Primary Care Provider: Danna Hefty, DO Consultants: None  Indication for Hospitalization: AKI  Discharge Diagnoses/Problem List:  AKI in the setting of CKD stage IIIa(resolved) Schizophrenia Right foot fractures s/p ORIF HTN Normocytic anemia, chronic, stable Neck pain (resolved)   Disposition: Discharge to [facility]  Discharge Condition: Stable  Discharge Exam: Physical Exam Vitals and nursing note reviewed.  Constitutional:      General: He is not in acute distress.    Appearance: Normal appearance. He is normal weight. He is not ill-appearing, toxic-appearing or diaphoretic.  Cardiovascular:     Rate and Rhythm: Normal rate and regular rhythm.     Pulses: Normal pulses.          Radial pulses are 2+ on the right side and 2+ on the left side.       Dorsalis pedis pulses are 2+ on the right side and 2+ on the left side.     Heart sounds: Normal heart sounds, S1 normal and S2 normal. No murmur heard.   Pulmonary:     Effort: Pulmonary effort is normal. No respiratory distress.     Breath sounds: Normal breath sounds. No wheezing.  Abdominal:     General: There is no distension.     Palpations: There is no mass.     Tenderness: There is no abdominal tenderness.  Musculoskeletal:     Right lower leg: No edema.     Left lower leg: No edema.  Neurological:     Mental Status: He is alert. Mental status is at baseline.    Brief Hospital Course:  AKI in the setting of CKD stage IIIa(resolved): On admission had a creatinine of 2.38 most likely due to poor oral intake. Was given IV fluids and encouraged to eat and drink. His creatinine improved to 1.46 on discharge.  Schizophrenia: Was stable  during admission and no changes needed to be made. Next dose of prolixin due on 05/23/20.  Right foot fractures s/p ORIF: S/P ORIF on 9/14. Was reexamined on 9/28 and had follow up x-ray taken. Wound was clean and dry with signs of infection. X-ray showed 2-4 Metatarsals healing well and Screw used to fix Kellogg fracture was still well seated. Recommend regular changing of dressing: clean with soap and water, wrap with gauze and compression bandage (such as ace wrap). Follow up with [name] on 10/7 at 1:15 PM  HTN: Has been normotensive during hospitalization and was normotensive during previous hospitalization. Have discontinued his Cozaar, but will continue with his Norvasc. Recommend checking BP as outpatient.  Normocytic anemia, chronic, stable: On admission his Hgb was 10.7. Has a baseline of 10-11 and was monitored for acute changes. On discharge his Hgb was stable at 11.9.  Neck pain (resolved): On admission he was complaining of neck pain. Had CT Head and Cervical Spine which showed no acute changes or fractures. After receiving Tylenol he had no further complaints during hospitalization. Patient at his baseline level of speech and mentation, AOx4.  Issues for Follow Up:  1. AKI in the setting of CKD stage IIIa(resolved): Repeat BMP to monitor for Creatinine. Ensure good oral intake.  2. Schizophrenia: Next dose of his Prolixin is due 10/7, ensure this is received.  3. Right foot fractures  s/p ORIF: Outpatient follow up with [name] in 1 week, 05/23/20 at 1:15 PM. Home health PT/OT follow up for improving strength, gait.  4.HTN: Have stopped his Cozaar. Monitor blood pressure for changes and possible changes in medication.  5.Normocytic anemia, chronic, stable: Continue to monitor Hgb to ensure there are no significant changes.  Significant Procedures: None  Significant Labs and Imaging:  Recent Labs  Lab 05/12/20 1231 05/16/20 1251  WBC 6.8 3.8*  HGB 10.7* 11.9*  HCT 33.4*  36.6*  PLT 214 166   Recent Labs  Lab 05/12/20 1231 05/12/20 1231 05/13/20 1505 05/13/20 1505 05/14/20 1217 05/16/20 1251  NA 142  --  140  --  140 138  K 4.2   < > 4.5   < > 4.2 4.3  CL 102  --  106  --  104 102  CO2 29  --  25  --  26 25  GLUCOSE 95  --  94  --  94 108*  BUN 37*  --  26*  --  25* 23  CREATININE 2.38*  --  1.43*  --  1.30* 1.46*  CALCIUM 9.5  --  8.6*  --  9.0 9.3  ALKPHOS 44  --   --   --   --   --   AST 25  --   --   --   --   --   ALT 23  --   --   --   --   --   ALBUMIN 4.0  --   --   --   --   --    < > = values in this interval not displayed.   CT Cervical Spine WO Contrast:No evidence of acute fracture or traumatic malalignment.  CT Head WO Contrast:No evidence of acute intracranial abnormality. Similar age advanced chronic microvascular ischemic disease, generalized atrophy, and remote left parietal cortical infarct.  DG Chest Port 1 View:No active disease.  Results/Tests Pending at Time of Discharge: None  Discharge Medications:  Allergies as of 05/16/2020      Reactions   Lisinopril Other (See Comments)   Cough   Penicillins Hives   Amoxicillin Rash   Ibuprofen Rash      Medication List    STOP taking these medications   aspirin 81 MG chewable tablet   atorvastatin 40 MG tablet Commonly known as: LIPITOR   losartan 100 MG tablet Commonly known as: COZAAR   nicotine 14 mg/24hr patch Commonly known as: NICODERM CQ - dosed in mg/24 hours     TAKE these medications   acetaminophen 325 MG tablet Commonly known as: TYLENOL Take 2 tablets (650 mg total) by mouth every 6 (six) hours as needed for mild pain, moderate pain or headache.   amLODipine 10 MG tablet Commonly known as: NORVASC Take 10 mg by mouth daily.   benztropine 1 MG tablet Commonly known as: COGENTIN Take 1 tablet (1 mg total) by mouth 2 (two) times daily.   cetirizine 10 MG tablet Commonly known as: ZyrTEC Allergy Take 1 tablet (10 mg total) by mouth  daily.   famotidine 20 MG tablet Commonly known as: PEPCID Take 20 mg by mouth 2 (two) times daily.   fluPHENAZine decanoate 25 MG/ML injection Commonly known as: PROLIXIN Inject 1 mL (25 mg total) into the muscle every 14 (fourteen) days. Last dose 09/07/12 What changed: additional instructions   fluticasone 50 MCG/ACT nasal spray Commonly known as: FLONASE Place 2 sprays into both  nostrils daily.   folic acid 1 MG tablet Commonly known as: FOLVITE Take 1 tablet (1 mg total) by mouth daily.   multivitamin with minerals Tabs tablet Take 1 tablet by mouth daily.   tamsulosin 0.4 MG Caps capsule Commonly known as: FLOMAX TAKE 2 CAPSULE BY MOUTH DAILY What changed: See the new instructions.   thiamine 100 MG tablet Take 1 tablet (100 mg total) by mouth daily.   vitamin C 500 MG tablet Commonly known as: ASCORBIC ACID Take 500 mg by mouth daily.            Discharge Care Instructions  (From admission, onward)         Start     Ordered   05/16/20 0000  Discharge wound care:       Comments: Apply dressing  Daily      Comments: May cleanse cleanse  with antibacterial soap and water. 4x4's Ace wrap   05/16/20 1422          Discharge Instructions: Please refer to Patient Instructions section of EMR for full details.  Patient was counseled important signs and symptoms that should prompt return to medical care, changes in medications, dietary instructions, activity restrictions, and follow up appointments.   Follow-Up Appointments: [name]: please arrive at 1:00 PM on 05/23/20 for 1:15 PM appointment. [address] [place], [place] [address], [phone]  [place] (PCP's office): hospital follow up 05/27/20 at 10:10 AM, [place], [place], [place] [address], [phone]  Call [facility] to get prolixin injection on 05/23/20. [address], [place], [place] [address] [phone]  Briant Cedar, MD 05/16/2020, 2:22  PM PGY-1, [place]

## 2020-05-16 NOTE — Discharge Instructions (Signed)
Dear Mr. Minehart,  It was a pleasure taking care of you. You were admitted for an acute injury to your kidneys. We feel like this was due to decreased eating and drinking. After giving you some IV fluids and resuming to eat and drink your kidney function returned to normal. Please ensure you stay hydrated to avoid similar issues in the future.  Thank you very much

## 2020-05-16 NOTE — Care Management Important Message (Signed)
Important Message  Patient Details  Name: [name] MRN: [id number] Date of Birth: 29-May-1951   Medicare Important Message Given:  Yes - Important Message mailed due to current National Emergency  Verbal consent obtained due to current National Emergency  Relationship to patient: Self Contact Name: [name] Call Date: 05/16/20  Time: 1151 Phone: [phone] Outcome: No Answer/Busy Important Message mailed to: Patient address on file    Delorse Lek 05/16/2020, 11:52 AM
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
## 2020-05-20 ENCOUNTER — Ambulatory Visit (HOSPITAL_COMMUNITY): Payer: Medicare Other

## 2020-05-20 ENCOUNTER — Emergency Department (HOSPITAL_COMMUNITY)
Admission: EM | Admit: 2020-05-20 | Discharge: 2020-05-20 | Disposition: A | Discharge status: Home / Self Care | Payer: Medicare Other | Attending: Emergency Medicine | Admitting: Emergency Medicine

## 2020-05-20 ENCOUNTER — Telehealth (HOSPITAL_COMMUNITY): Payer: Self-pay | Admitting: *Deleted

## 2020-05-20 ENCOUNTER — Other Ambulatory Visit: Payer: Self-pay

## 2020-05-20 DIAGNOSIS — I5030 Unspecified diastolic (congestive) heart failure: Secondary | ICD-10-CM | POA: Insufficient documentation

## 2020-05-20 DIAGNOSIS — M79671 Pain in right foot: Secondary | ICD-10-CM

## 2020-05-20 DIAGNOSIS — F0390 Unspecified dementia without behavioral disturbance: Secondary | ICD-10-CM | POA: Diagnosis not present

## 2020-05-20 DIAGNOSIS — Z7951 Long term (current) use of inhaled steroids: Secondary | ICD-10-CM | POA: Diagnosis not present

## 2020-05-20 DIAGNOSIS — I110 Hypertensive heart disease with heart failure: Secondary | ICD-10-CM | POA: Insufficient documentation

## 2020-05-20 DIAGNOSIS — Z79899 Other long term (current) drug therapy: Secondary | ICD-10-CM | POA: Diagnosis not present

## 2020-05-20 DIAGNOSIS — F17210 Nicotine dependence, cigarettes, uncomplicated: Secondary | ICD-10-CM | POA: Diagnosis not present

## 2020-05-20 MED ORDER — OXYCODONE HCL 5 MG PO TABS
5.0000 mg | ORAL_TABLET | Freq: Once | ORAL | Status: AC
  Administered 2020-05-20: 5 mg via ORAL
  Filled 2020-05-20: qty 1

## 2020-05-20 NOTE — Discharge Instructions (Addendum)
1. Medications: tylenol for pain, usual home medications 2. Treatment: rest, drink plenty of fluids,  3. Follow Up: Please followup with [name] as directed; Please return to the ER for fever, chills, more swelling, redness or increasing pain

## 2020-05-20 NOTE — ED Notes (Signed)
Ambulatory back to RM01 from waiting room with steady gait. LLE in boot. Vitals stable.

## 2020-05-20 NOTE — ED Triage Notes (Signed)
Foot pain on the right foot. Boot is on already

## 2020-05-20 NOTE — ED Notes (Signed)
Patient ambulated to and from restroom with steady gait

## 2020-05-20 NOTE — Telephone Encounter (Signed)
Walked in to building with his wife. He is wearing his boot for his fx foot on his R foot but trying to take it off while Probation officer present. Writer took it off to put a sock on it which is what he was trying to do and put the boot back on. Shot is not due today, due on the 7th. Will go to home to see him due to his fx foot. He was thought to have been in the hospital till he showed up today. Angie states he got out yesterday and wouldn't go back to [facility]. Called peer support to arrange transport back. Discussed with him the importance of staying off his foot and not waling away from [facility]. He said he understood and would do better. Staff feeling frustrated due to his non compliance and in the three weeks he has been in [employer] he has been in the hospital twice and got out of the hospital to go to [employer] initially. Will follow up with him next week.

## 2020-05-20 NOTE — ED Provider Notes (Signed)
[facility] EMERGENCY DEPARTMENT Provider Note   CSN: [id number] Arrival date & time: 05/20/20  0036     History Chief Complaint  Patient presents with   Foot Pain    [name] is a 69 y.o. male with a hx as listed below presents to the Emergency Department complaining of gradual, persistent, right foot pain.  Pt is s/p ORIF for Lisfranc fracture on 04/30/2020.  He has follow-up with [name] on 05/23/2020.  Patient reports that he has been taking Tylenol as directed for pain but tonight he had trouble sleeping because his foot was hurting.  He reports no additional treatments prior to arrival.  He reports he is wearing his boot without difficulty but is walking on his foot.  No specific aggravating or alleviating factors.  Patient does report some minor swelling to his foot but states this has been present since surgery.  Denies fever, chills, increased redness, drainage.   The history is provided by the patient and medical records. No language interpreter was used.       Past Medical History:  Diagnosis Date   BPH (benign prostatic hyperplasia)    Colon polyp 2010   Depression    GERD (gastroesophageal reflux disease)    Hepatitis C    "caught it when I had a blood transfusion"   High cholesterol    History of blood transfusion    "when I was young"   Hypertension    Paranoid schizophrenia ([place])    Prostate atrophy    Retinal vein occlusion     Patient Active Problem List   Diagnosis Date Noted   Foot pain, right    AKI (acute kidney injury) ([place]) 05/12/2020   Lisfranc dislocation, right, initial encounter    Closed displaced fracture of fifth metatarsal bone of right foot    History of hepatitis B 04/26/2020   Chronic hepatitis ([place])    Crush injury    Dementia without behavioral disturbance ([place])    Fever of unknown origin    Fever 04/24/2020   Atrial fibrillation with RVR ([place]) 04/24/2020   Malnutrition of moderate  degree 57/08/7791   Acute metabolic encephalopathy 90/30/0923   SIRS (systemic inflammatory response syndrome) ([place]) 04/23/2020   Acute encephalopathy 04/23/2020   GERD (gastroesophageal reflux disease) 05/03/2019   Hypotension 08/02/2018   Low back sprain, initial encounter 10/21/2017   History of drug abuse ([place]) 10/18/2017   Gingival erythema 09/23/2017   Chronic pain of left knee 11/24/2016   Tobacco abuse    Diastolic heart failure ([place]) 02/05/2015   Healthcare maintenance 05/28/2014   Hyperlipidemia 03/10/2014   Patellar tendonitis 11/16/2012   BRANCH RETINAL VEIN OCCLUSION 06/03/2010   PARANOID SCHIZOPHRENIA, CHRONIC 01/17/2009   HYPERTENSION, BENIGN ESSENTIAL 01/17/2009   BPH (benign prostatic hyperplasia) 01/17/2009   History of hepatitis C 12/14/2008   DEPRESSION 12/14/2008    Past Surgical History:  Procedure Laterality Date   CIRCUMCISION  1959   ORIF TOE FRACTURE Right 04/30/2020   Procedure: OPEN REDUCTION INTERNAL FIXATION (ORIF) RIGHT FOOT METATARSAL FRACTURES;  Surgeon: Newt Minion, MD;  Location: [facility];  Service: Orthopedics;  Laterality: Right;   TONSILLECTOMY         Family History  Problem Relation Age of Onset   Hypertension Mother    Diabetes Mother    Stroke Mother     Social History   Tobacco Use   Smoking status: Current Every Day Smoker    Packs/day: 0.50  Years: 48.00    Pack years: 24.00    Types: Cigarettes   Smokeless tobacco: Never Used  Substance Use Topics   Alcohol use: Yes   Drug use: No    Home Medications Prior to Admission medications   Medication Sig Start Date End Date Taking? Authorizing Provider  acetaminophen (TYLENOL) 325 MG tablet Take 2 tablets (650 mg total) by mouth every 6 (six) hours as needed for mild pain, moderate pain or headache. 05/09/20   Mullis, Kiersten P, DO  amLODipine (NORVASC) 10 MG tablet Take 10 mg by mouth daily. 05/10/20   [provider]  benztropine  (COGENTIN) 1 MG tablet Take 1 tablet (1 mg total) by mouth 2 (two) times daily. 02/26/20   Salley Slaughter, NP  cetirizine (ZYRTEC ALLERGY) 10 MG tablet Take 1 tablet (10 mg total) by mouth daily. 02/01/20   [name], [name], PA-C  famotidine (PEPCID) 20 MG tablet Take 20 mg by mouth 2 (two) times daily. 05/10/20   [provider]  fluPHENAZine decanoate (PROLIXIN) 25 MG/ML injection Inject 1 mL (25 mg total) into the muscle every 14 (fourteen) days. Last dose 09/07/12 Patient taking differently: Inject 25 mg into the muscle every 14 (fourteen) days. Last dose 05/09/2020 02/26/20   Salley Slaughter, NP  fluticasone (FLONASE) 50 MCG/ACT nasal spray Place 2 sprays into both nostrils daily. 05/10/20   [provider]  folic acid (FOLVITE) 1 MG tablet Take 1 tablet (1 mg total) by mouth daily. 05/09/20   Mullis, Kiersten P, DO  Multiple Vitamin (MULTIVITAMIN WITH MINERALS) TABS tablet Take 1 tablet by mouth daily. Patient not taking: Reported on 05/13/2020 06/28/14   [name], MD  tamsulosin (FLOMAX) 0.4 MG CAPS capsule TAKE 2 CAPSULE BY MOUTH DAILY Patient taking differently: Take 0.8 mg by mouth daily.  02/23/20   Mullis, Kiersten P, DO  thiamine 100 MG tablet Take 1 tablet (100 mg total) by mouth daily. Patient not taking: Reported on 05/13/2020 05/09/20   Mina Marble P, DO  vitamin C (ASCORBIC ACID) 500 MG tablet Take 500 mg by mouth daily. Patient not taking: Reported on 05/13/2020    [provider]  loratadine (CLARITIN) 10 MG tablet Take 1 tablet (10 mg total) by mouth daily. 12/28/19 02/05/20  [name], DO    Allergies    Lisinopril, Penicillins, Amoxicillin, and Ibuprofen  Review of Systems   Review of Systems  Constitutional: Negative for chills and fever.  Gastrointestinal: Negative for nausea and vomiting.  Musculoskeletal: Positive for arthralgias.  Skin: Positive for wound.    Physical Exam Updated Vital Signs BP (!) 142/90 (BP Location:  Right Arm)    Pulse 86    Temp 98.6 F (37 C) (Oral)    Resp 18    SpO2 100%   Physical Exam Vitals and nursing note reviewed.  Constitutional:      General: He is not in acute distress.    Appearance: He is well-developed.  HENT:     Head: Normocephalic.  Eyes:     General: No scleral icterus.    Conjunctiva/sclera: Conjunctivae normal.  Cardiovascular:     Rate and Rhythm: Normal rate.  Pulmonary:     Effort: Pulmonary effort is normal.  Musculoskeletal:        General: Normal range of motion.     Cervical back: Normal range of motion.     Right knee: Normal.     Right lower leg: Normal.     Right  ankle: Normal. Normal range of motion.     Right foot: Normal range of motion. Swelling ( minimal) and tenderness ( Generalized) present. No foot drop. Normal pulse.  Skin:    General: Skin is warm and dry.     Comments: Surgical incision clean and dry.  No erythema or drainage.  Neurological:     Mental Status: He is alert.           ED Results / Procedures / Treatments   Labs (all labs ordered are listed, but only abnormal results are displayed) Labs Reviewed - No data to display  EKG None  Radiology No results found.  Procedures Procedures (including critical care time)  Medications Ordered in ED Medications  oxyCODONE (Oxy IR/ROXICODONE) immediate release tablet 5 mg (5 mg Oral Given 05/20/20 0534)    ED Course  I have reviewed the triage vital signs and the nursing notes.  Pertinent labs & imaging results that were available during my care of the patient were reviewed by me and considered in my medical decision making (see chart for details).    MDM Rules/Calculators/A&P                           Patient presents with pain in his foot.  Reports that he came to the emergency room tonight because he had more trouble sleeping than usual.  Denies fevers or chills, nausea or vomiting.  Denies other complaints about his foot.  Has been taking Tylenol at home.   Has follow-up with [name] scheduled on 05/23/2020.  Patient given pain control here and discharged with close follow-up instructions and reasons to return to the emergency department.   Final Clinical Impression(s) / ED Diagnoses Final diagnoses:  Foot pain, right    Rx / DC Orders ED Discharge Orders    None       Lynne Righi, Gwenlyn Perking 05/20/20 [place], [place], DO 05/20/20 [phone]

## 2020-05-22 ENCOUNTER — Telehealth (HOSPITAL_COMMUNITY): Payer: Self-pay | Admitting: *Deleted

## 2020-05-22 NOTE — Telephone Encounter (Signed)
[name] is due for his bimonthly shot tomorrow. [address] [place] where he is currently living to coordinate his shot. For tomorrow due to his broken foot writer will go out to him and give him his injection. In the future [facility] staff will bring him here for his shot.

## 2020-05-23 ENCOUNTER — Ambulatory Visit (HOSPITAL_COMMUNITY): Payer: Medicare Other

## 2020-05-23 ENCOUNTER — Inpatient Hospital Stay: Payer: Medicare Other | Admitting: Orthopedic Surgery

## 2020-05-24 ENCOUNTER — Other Ambulatory Visit: Payer: Self-pay

## 2020-05-24 ENCOUNTER — Ambulatory Visit (HOSPITAL_COMMUNITY): Payer: Medicare Other | Admitting: *Deleted

## 2020-05-24 DIAGNOSIS — F200 Paranoid schizophrenia: Secondary | ICD-10-CM

## 2020-05-24 MED ORDER — FLUPHENAZINE DECANOATE 25 MG/ML IJ SOLN
25.0000 mg | INTRAMUSCULAR | Status: DC
  Administered 2020-05-23: 25 mg via INTRAMUSCULAR

## 2020-05-24 NOTE — Addendum Note (Signed)
Addended by: Davina Poke on: 05/24/2020 10:22 AM   Modules accepted: Orders

## 2020-05-24 NOTE — Progress Notes (Signed)
Home visit yesterday pm to give him his every two week injection of Prolixin D. He received 1 cc in his L deltoid without issue. He continues to have a fx R foot and is wearing a boot. While he is healing will make home visits. Once he is out of the [employer] will bring him to the clinic. He was pleasant and appropriate. Spent some time with Probation officer talking about his younger days. He states his wife has been visiting him every day and brought him a new set of clothes he is wearing today. He asked Probation officer to take him to the store, he wanted cigarettes or writer to bring them to him. Writer refused. Will see him in two weeks for his next injection.

## 2020-05-27 ENCOUNTER — Inpatient Hospital Stay: Payer: Medicare Other | Admitting: Orthopedic Surgery

## 2020-05-27 ENCOUNTER — Inpatient Hospital Stay: Payer: Medicare Other

## 2020-05-27 NOTE — Progress Notes (Deleted)
   PROVIDER AGENDA:  *** Current Outpatient Medications  Medication Instructions  . acetaminophen (TYLENOL) 650 mg, Oral, Every 6 hours PRN  . amLODipine (NORVASC) 10 mg, Oral, Daily  . benztropine (COGENTIN) 1 mg, Oral, 2 times daily  . cetirizine (ZYRTEC ALLERGY) 10 mg, Oral, Daily  . famotidine (PEPCID) 20 mg, Oral, 2 times daily  . fluPHENAZine decanoate (PROLIXIN) 25 mg, Intramuscular, Every 14 days, Last dose 09/07/12  . fluticasone (FLONASE) 50 MCG/ACT nasal spray 2 sprays, Each Nare, Daily  . folic acid (FOLVITE) 1 mg, Oral, Daily  . Multiple Vitamin (MULTIVITAMIN WITH MINERALS) TABS tablet 1 tablet, Oral, Daily  . tamsulosin (FLOMAX) 0.4 MG CAPS capsule TAKE 2 CAPSULE BY MOUTH DAILY  . thiamine 100 mg, Oral, Daily  . vitamin C (ASCORBIC ACID) 500 mg, Daily   Health Maintenance Due  Topic Date Due  . COVID-19 Vaccine (1) Never done  . COLONOSCOPY  09/27/2019  . INFLUENZA VACCINE  03/17/2020    SUBJECTIVE:  CHIEF COMPLAINT / HPI:  Issues for Follow Up:  1. AKI in the setting of CKD stage IIIa(resolved): Repeat BMP to monitor for Creatinine. Ensure good oral intake.  2. Schizophrenia: Next dose of his Prolixin is due 10/7, ensure this is received.  3. Right foot fractures s/p ORIF: Outpatient follow up with [name] in 1 week, 05/23/20 at 1:15 PM. Home health PT/OT follow up for improving strength, gait.  4.HTN: Have stopped his Cozaar. Monitor blood pressure for changes and possible changes in medication.  5.Normocytic anemia, chronic, stable: Continue to monitor Hgb to ensure there are no significant changes.    PERTINENT  PMH / PSH: ***  Patient Active Problem List   Diagnosis Date Noted  . Foot pain, right   . AKI (acute kidney injury) ([place]) 05/12/2020  . Lisfranc dislocation, right, initial encounter   . Closed displaced fracture of fifth metatarsal bone of right foot   . History of hepatitis B 04/26/2020  . Chronic hepatitis ([place])   . Crush injury     . Dementia without behavioral disturbance ([place])   . Fever of unknown origin   . Fever 04/24/2020  . Atrial fibrillation with RVR ([place]) 04/24/2020  . Malnutrition of moderate degree 04/24/2020  . Acute metabolic encephalopathy 36/46/8032  . SIRS (systemic inflammatory response syndrome) ([place]) 04/23/2020  . Acute encephalopathy 04/23/2020  . GERD (gastroesophageal reflux disease) 05/03/2019  . Hypotension 08/02/2018  . Low back sprain, initial encounter 10/21/2017  . History of drug abuse ([place]) 10/18/2017  . Gingival erythema 09/23/2017  . Chronic pain of left knee 11/24/2016  . Tobacco abuse   . Diastolic heart failure ([place]) 02/05/2015  . Healthcare maintenance 05/28/2014  . Hyperlipidemia 03/10/2014  . Patellar tendonitis 11/16/2012  . [place] OCCLUSION 06/03/2010  . PARANOID SCHIZOPHRENIA, CHRONIC 01/17/2009  . HYPERTENSION, BENIGN ESSENTIAL 01/17/2009  . BPH (benign prostatic hyperplasia) 01/17/2009  . History of hepatitis C 12/14/2008  . DEPRESSION 12/14/2008    OBJECTIVE:  There were no vitals taken for this visit.  ***  ASSESSMENT/PLAN:  No problem-specific Assessment & Plan notes found for this encounter.    Wilber Oliphant, MD [place]

## 2020-05-28 ENCOUNTER — Encounter (HOSPITAL_COMMUNITY): Payer: Self-pay

## 2020-05-28 ENCOUNTER — Encounter (HOSPITAL_COMMUNITY): Payer: Medicare Other | Admitting: Psychiatry

## 2020-05-28 ENCOUNTER — Emergency Department (HOSPITAL_COMMUNITY)
Admission: EM | Admit: 2020-05-28 | Discharge: 2020-05-29 | Disposition: A | Discharge status: Home / Self Care | Payer: Medicare Other | Attending: Emergency Medicine | Admitting: Emergency Medicine

## 2020-05-28 DIAGNOSIS — F17210 Nicotine dependence, cigarettes, uncomplicated: Secondary | ICD-10-CM | POA: Insufficient documentation

## 2020-05-28 DIAGNOSIS — I10 Essential (primary) hypertension: Secondary | ICD-10-CM | POA: Diagnosis not present

## 2020-05-28 DIAGNOSIS — M79671 Pain in right foot: Secondary | ICD-10-CM | POA: Diagnosis present

## 2020-05-28 DIAGNOSIS — Z79899 Other long term (current) drug therapy: Secondary | ICD-10-CM | POA: Diagnosis not present

## 2020-05-28 DIAGNOSIS — F0390 Unspecified dementia without behavioral disturbance: Secondary | ICD-10-CM | POA: Diagnosis not present

## 2020-05-28 NOTE — ED Triage Notes (Signed)
To triage via EMS.  Onset 2 weeks right ankle pain.  No known injury, edema, obvious injury.  Able to bear weight on right foot.  EMS BP 180/116 CBG 96 Slurred speech is his normal.

## 2020-05-29 ENCOUNTER — Telehealth (HOSPITAL_COMMUNITY): Payer: Self-pay | Admitting: *Deleted

## 2020-05-29 DIAGNOSIS — M79671 Pain in right foot: Secondary | ICD-10-CM | POA: Diagnosis not present

## 2020-05-29 MED ORDER — KETOROLAC TROMETHAMINE 30 MG/ML IJ SOLN
15.0000 mg | Freq: Once | INTRAMUSCULAR | Status: AC
  Administered 2020-05-29: 15 mg via INTRAMUSCULAR
  Filled 2020-05-29: qty 1

## 2020-05-29 MED ORDER — ACETAMINOPHEN 500 MG PO TABS
1000.0000 mg | ORAL_TABLET | Freq: Once | ORAL | Status: AC
  Administered 2020-05-29: 1000 mg via ORAL
  Filled 2020-05-29: qty 2

## 2020-05-29 NOTE — Telephone Encounter (Signed)
I spoke with Delia Chimes at [facility] at her request to coordinate care for [name]. We scheduled a meeting with [facility] Balloon staff for wed the 20th at [place] at [facility]. Aaron Edelman, peer support notified of the meeting and is able to attend. Trying to get a village together for [employer] and care.

## 2020-05-29 NOTE — Discharge Instructions (Signed)
Your appointment is tomorrow at 2:45 in the afternoon.  Please see [name] in the office and discuss with him your pain in your foot.    Take 4 over the counter ibuprofen tablets 3 times a day or 2 over-the-counter naproxen tablets twice a day for pain. Also take tylenol 1000mg (2 extra strength) four times a day.

## 2020-05-29 NOTE — Telephone Encounter (Signed)
Notified by peer support person Aaron Edelman that patient back in the hospital. An hour or so later he showed up here still wearing BP cuff. Shawn spoke with him at length as he has had a long relationship with him from previous employment re him staying at [facility] and not wandering away from the facility. He was picked up from here by Aaron Edelman and returned back to [facility] but after first going by his wifes house. Due to loud arguing in home between wife and her brother brian left there with [facility] and returned him to [employer]. Aaron Edelman called here to give name and number of nurse there to speak with to coordinate his care. I have called twice and not gotten an answer with her VM not set up to leave a message.

## 2020-05-29 NOTE — ED Provider Notes (Signed)
[facility] EMERGENCY DEPARTMENT Provider Note   CSN: [id number] Arrival date & time: 05/28/20  2257     History Chief Complaint  Patient presents with  . Ankle Pain    [name] is a 69 y.o. male.  69 yo M with a chief complaints of right foot pain.  This been an ongoing problem for him.  Has had a Lisfranc fracture with surgical repair done by [name] on 14 September.  Says it still continues to hurt him.  Unfortunately missed his initial follow-up visit with [name] in the office.  Is scheduled for an appointment tomorrow.  He denies recurrent trauma.  Sutures are still in place without any drainage.  The history is provided by the patient.  Ankle Pain Associated symptoms: no fever   Foot Pain This is a chronic problem. The current episode started more than 1 week ago. The problem occurs constantly. The problem has not changed since onset.Pertinent negatives include no chest pain, no abdominal pain, no headaches and no shortness of breath. The symptoms are aggravated by bending and twisting. Nothing relieves the symptoms. He has tried nothing for the symptoms. The treatment provided no relief.       Past Medical History:  Diagnosis Date  . BPH (benign prostatic hyperplasia)   . Colon polyp 2010  . Depression   . GERD (gastroesophageal reflux disease)   . Hepatitis C    "caught it when I had a blood transfusion"  . High cholesterol   . History of blood transfusion    "when I was young"  . Hypertension   . Paranoid schizophrenia ([place])   . Prostate atrophy   . Retinal vein occlusion     Patient Active Problem List   Diagnosis Date Noted  . Foot pain, right   . AKI (acute kidney injury) ([place]) 05/12/2020  . Lisfranc dislocation, right, initial encounter   . Closed displaced fracture of fifth metatarsal bone of right foot   . History of hepatitis B 04/26/2020  . Chronic hepatitis ([place])   . Crush injury   . Dementia without behavioral  disturbance ([place])   . Fever of unknown origin   . Fever 04/24/2020  . Atrial fibrillation with RVR ([place]) 04/24/2020  . Malnutrition of moderate degree 04/24/2020  . Acute metabolic encephalopathy 37/85/8850  . SIRS (systemic inflammatory response syndrome) ([place]) 04/23/2020  . Acute encephalopathy 04/23/2020  . GERD (gastroesophageal reflux disease) 05/03/2019  . Hypotension 08/02/2018  . Low back sprain, initial encounter 10/21/2017  . History of drug abuse ([place]) 10/18/2017  . Gingival erythema 09/23/2017  . Chronic pain of left knee 11/24/2016  . Tobacco abuse   . Diastolic heart failure ([place]) 02/05/2015  . Healthcare maintenance 05/28/2014  . Hyperlipidemia 03/10/2014  . Patellar tendonitis 11/16/2012  . [place] OCCLUSION 06/03/2010  . PARANOID SCHIZOPHRENIA, CHRONIC 01/17/2009  . HYPERTENSION, BENIGN ESSENTIAL 01/17/2009  . BPH (benign prostatic hyperplasia) 01/17/2009  . History of hepatitis C 12/14/2008  . DEPRESSION 12/14/2008    Past Surgical History:  Procedure Laterality Date  . CIRCUMCISION  1959  . ORIF TOE FRACTURE Right 04/30/2020   Procedure: OPEN REDUCTION INTERNAL FIXATION (ORIF) RIGHT FOOT METATARSAL FRACTURES;  Surgeon: Newt Minion, MD;  Location: [facility];  Service: Orthopedics;  Laterality: Right;  . TONSILLECTOMY         Family History  Problem Relation Age of Onset  . Hypertension Mother   . Diabetes Mother   . Stroke  Mother     Social History   Tobacco Use  . Smoking status: Current Every Day Smoker    Packs/day: 0.50    Years: 48.00    Pack years: 24.00    Types: Cigarettes  . Smokeless tobacco: Never Used  Substance Use Topics  . Alcohol use: Yes  . Drug use: No    Home Medications Prior to Admission medications   Medication Sig Start Date End Date Taking? Authorizing Provider  acetaminophen (TYLENOL) 325 MG tablet Take 2 tablets (650 mg total) by mouth every 6 (six) hours as needed for mild pain, moderate pain or  headache. 05/09/20   Mullis, Kiersten P, DO  amLODipine (NORVASC) 10 MG tablet Take 10 mg by mouth daily. 05/10/20   [provider]  benztropine (COGENTIN) 1 MG tablet Take 1 tablet (1 mg total) by mouth 2 (two) times daily. 02/26/20   Salley Slaughter, NP  cetirizine (ZYRTEC ALLERGY) 10 MG tablet Take 1 tablet (10 mg total) by mouth daily. 02/01/20   [name], [name], PA-C  famotidine (PEPCID) 20 MG tablet Take 20 mg by mouth 2 (two) times daily. 05/10/20   [provider]  fluPHENAZine decanoate (PROLIXIN) 25 MG/ML injection Inject 1 mL (25 mg total) into the muscle every 14 (fourteen) days. Last dose 09/07/12 Patient taking differently: Inject 25 mg into the muscle every 14 (fourteen) days. Last dose 05/09/2020 02/26/20   Salley Slaughter, NP  fluticasone (FLONASE) 50 MCG/ACT nasal spray Place 2 sprays into both nostrils daily. 05/10/20   [provider]  folic acid (FOLVITE) 1 MG tablet Take 1 tablet (1 mg total) by mouth daily. 05/09/20   Mullis, Kiersten P, DO  Multiple Vitamin (MULTIVITAMIN WITH MINERALS) TABS tablet Take 1 tablet by mouth daily. Patient not taking: Reported on 05/13/2020 06/28/14   [name], MD  tamsulosin (FLOMAX) 0.4 MG CAPS capsule TAKE 2 CAPSULE BY MOUTH DAILY Patient taking differently: Take 0.8 mg by mouth daily.  02/23/20   Mullis, Kiersten P, DO  thiamine 100 MG tablet Take 1 tablet (100 mg total) by mouth daily. Patient not taking: Reported on 05/13/2020 05/09/20   Mina Marble P, DO  vitamin C (ASCORBIC ACID) 500 MG tablet Take 500 mg by mouth daily. Patient not taking: Reported on 05/13/2020    [provider]  loratadine (CLARITIN) 10 MG tablet Take 1 tablet (10 mg total) by mouth daily. 12/28/19 02/05/20  [name], DO    Allergies    Lisinopril, Penicillins, Amoxicillin, and Ibuprofen  Review of Systems   Review of Systems  Constitutional: Negative for chills and fever.  HENT: Negative for congestion and facial  swelling.   Eyes: Negative for discharge and visual disturbance.  Respiratory: Negative for shortness of breath.   Cardiovascular: Negative for chest pain and palpitations.  Gastrointestinal: Negative for abdominal pain, diarrhea and vomiting.  Musculoskeletal: Positive for arthralgias and gait problem. Negative for myalgias.  Skin: Negative for color change and rash.  Neurological: Negative for tremors, syncope and headaches.  Psychiatric/Behavioral: Negative for confusion and dysphoric mood.    Physical Exam Updated Vital Signs BP (!) 155/98 (BP Location: Right Arm)   Pulse 95   Temp 98.5 F (36.9 C) (Oral)   Resp 18   SpO2 100%   Physical Exam Vitals and nursing note reviewed.  Constitutional:      Appearance: He is well-developed.  HENT:     Head: Normocephalic and atraumatic.  Eyes:     Pupils:  Pupils are equal, round, and reactive to light.  Neck:     Vascular: No JVD.  Cardiovascular:     Rate and Rhythm: Normal rate and regular rhythm.     Heart sounds: No murmur heard.  No friction rub. No gallop.   Pulmonary:     Effort: No respiratory distress.     Breath sounds: No wheezing.  Abdominal:     General: There is no distension.     Tenderness: There is no guarding or rebound.  Musculoskeletal:        General: Normal range of motion.     Cervical back: Normal range of motion and neck supple.     Comments: No obvious pain on exam.  Patient has a incision to the dorsum of the foot that is clean dry and intact.  Sutures are still in place.  He has incision to the lateral aspect of the foot the wound is clean dry and intact and the sutures are in place.  No drainage no erythema no warmth.  Trace edema.  Pulse motor and sensation intact.  Skin:    Coloration: Skin is not pale.     Findings: No rash.  Neurological:     Mental Status: He is alert and oriented to person, place, and time.  Psychiatric:        Behavior: Behavior normal.     ED Results / Procedures /  Treatments   Labs (all labs ordered are listed, but only abnormal results are displayed) Labs Reviewed - No data to display  EKG None  Radiology No results found.  Procedures Procedures (including critical care time)  Medications Ordered in ED Medications  acetaminophen (TYLENOL) tablet 1,000 mg (1,000 mg Oral Given 05/29/20 0844)  ketorolac (TORADOL) 30 MG/ML injection 15 mg (15 mg Intramuscular Given 05/29/20 0844)    ED Course  I have reviewed the triage vital signs and the nursing notes.  Pertinent labs & imaging results that were available during my care of the patient were reviewed by me and considered in my medical decision making (see chart for details).    MDM Rules/Calculators/A&P                          69 yo M with a chief complaints of right foot pain.  The patient has a Lisfranc fracture that was repaired by [name].  No recurrent trauma.  He actually has follow-up with [name] tomorrow in the afternoon.  We will have him follow-up as scheduled.  9:46 AM:  I have discussed the diagnosis/risks/treatment options with the patient and believe the pt to be eligible for discharge home to follow-up with Ortho. We also discussed returning to the ED immediately if new or worsening sx occur. We discussed the sx which are most concerning (e.g., sudden worsening pain, fever, inability to tolerate by mouth) that necessitate immediate return. Medications administered to the patient during their visit and any new prescriptions provided to the patient are listed below.  Medications given during this visit Medications  acetaminophen (TYLENOL) tablet 1,000 mg (1,000 mg Oral Given 05/29/20 0844)  ketorolac (TORADOL) 30 MG/ML injection 15 mg (15 mg Intramuscular Given 05/29/20 0844)     The patient appears reasonably screen and/or stabilized for discharge and I doubt any other medical condition or other [facility] requiring further screening, evaluation, or treatment in the ED at this  time prior to discharge.   Final Clinical Impression(s) / ED Diagnoses  Final diagnoses:  Right foot pain    Rx / DC Orders ED Discharge Orders    None       [name], DO 05/29/20 [phone]

## 2020-05-30 ENCOUNTER — Ambulatory Visit (INDEPENDENT_AMBULATORY_CARE_PROVIDER_SITE_OTHER): Payer: Medicare Other | Admitting: Orthopedic Surgery

## 2020-05-30 ENCOUNTER — Encounter: Payer: Self-pay | Admitting: Orthopedic Surgery

## 2020-05-30 ENCOUNTER — Ambulatory Visit: Payer: Self-pay

## 2020-05-30 ENCOUNTER — Telehealth (HOSPITAL_COMMUNITY): Payer: Self-pay | Admitting: *Deleted

## 2020-05-30 VITALS — Ht 69.0 in | Wt 143.0 lb

## 2020-05-30 DIAGNOSIS — S92901S Unspecified fracture of right foot, sequela: Secondary | ICD-10-CM | POA: Diagnosis not present

## 2020-05-30 DIAGNOSIS — M79671 Pain in right foot: Secondary | ICD-10-CM

## 2020-05-30 NOTE — Telephone Encounter (Signed)
Call to [facility] to make a request for a care coordinator for him and left a message for [employer] seeking an act team for him.

## 2020-05-30 NOTE — Telephone Encounter (Signed)
Nothing further required.   

## 2020-06-02 ENCOUNTER — Encounter (HOSPITAL_COMMUNITY): Payer: Self-pay | Admitting: Emergency Medicine

## 2020-06-02 ENCOUNTER — Other Ambulatory Visit: Payer: Self-pay

## 2020-06-02 ENCOUNTER — Emergency Department (HOSPITAL_COMMUNITY)
Admission: EM | Admit: 2020-06-02 | Discharge: 2020-06-02 | Disposition: A | Discharge status: Home / Self Care | Payer: Medicare Other | Attending: Emergency Medicine | Admitting: Emergency Medicine

## 2020-06-02 DIAGNOSIS — Z5321 Procedure and treatment not carried out due to patient leaving prior to being seen by health care provider: Secondary | ICD-10-CM | POA: Diagnosis not present

## 2020-06-02 DIAGNOSIS — W19XXXA Unspecified fall, initial encounter: Secondary | ICD-10-CM | POA: Insufficient documentation

## 2020-06-02 DIAGNOSIS — M25561 Pain in right knee: Secondary | ICD-10-CM | POA: Diagnosis present

## 2020-06-02 NOTE — ED Notes (Signed)
Called multiple times , no answer.

## 2020-06-02 NOTE — ED Triage Notes (Signed)
Patient reports right knee pain injured 2 weeks ago from a fall ,ambulatory .

## 2020-06-03 ENCOUNTER — Ambulatory Visit (HOSPITAL_COMMUNITY): Payer: Self-pay

## 2020-06-04 ENCOUNTER — Emergency Department (HOSPITAL_COMMUNITY): Payer: Medicare Other

## 2020-06-04 ENCOUNTER — Emergency Department (HOSPITAL_BASED_OUTPATIENT_CLINIC_OR_DEPARTMENT_OTHER): Payer: Medicare Other

## 2020-06-04 ENCOUNTER — Encounter (HOSPITAL_COMMUNITY): Payer: Self-pay | Admitting: Emergency Medicine

## 2020-06-04 ENCOUNTER — Other Ambulatory Visit: Payer: Self-pay

## 2020-06-04 ENCOUNTER — Inpatient Hospital Stay (HOSPITAL_COMMUNITY)
Admission: EM | Admit: 2020-06-04 | Discharge: 2020-06-26 | DRG: 300 | Disposition: A | Discharge status: Nursing Home (Includes ICF) | Payer: Medicare Other | Attending: Family Medicine | Admitting: Family Medicine

## 2020-06-04 ENCOUNTER — Observation Stay (HOSPITAL_COMMUNITY): Payer: Medicare Other

## 2020-06-04 ENCOUNTER — Encounter: Payer: Self-pay | Admitting: Orthopedic Surgery

## 2020-06-04 DIAGNOSIS — Z91199 Patient's noncompliance with other medical treatment and regimen due to unspecified reason: Secondary | ICD-10-CM

## 2020-06-04 DIAGNOSIS — I824Z9 Acute embolism and thrombosis of unspecified deep veins of unspecified distal lower extremity: Secondary | ICD-10-CM | POA: Diagnosis not present

## 2020-06-04 DIAGNOSIS — Z20822 Contact with and (suspected) exposure to covid-19: Secondary | ICD-10-CM | POA: Diagnosis present

## 2020-06-04 DIAGNOSIS — Z886 Allergy status to analgesic agent status: Secondary | ICD-10-CM

## 2020-06-04 DIAGNOSIS — S93326A Dislocation of tarsometatarsal joint of unspecified foot, initial encounter: Secondary | ICD-10-CM | POA: Diagnosis present

## 2020-06-04 DIAGNOSIS — Z09 Encounter for follow-up examination after completed treatment for conditions other than malignant neoplasm: Secondary | ICD-10-CM

## 2020-06-04 DIAGNOSIS — I4891 Unspecified atrial fibrillation: Secondary | ICD-10-CM | POA: Diagnosis present

## 2020-06-04 DIAGNOSIS — G9340 Encephalopathy, unspecified: Secondary | ICD-10-CM | POA: Diagnosis not present

## 2020-06-04 DIAGNOSIS — F209 Schizophrenia, unspecified: Secondary | ICD-10-CM | POA: Diagnosis not present

## 2020-06-04 DIAGNOSIS — Z9119 Patient's noncompliance with other medical treatment and regimen: Secondary | ICD-10-CM

## 2020-06-04 DIAGNOSIS — M7989 Other specified soft tissue disorders: Secondary | ICD-10-CM | POA: Diagnosis not present

## 2020-06-04 DIAGNOSIS — K219 Gastro-esophageal reflux disease without esophagitis: Secondary | ICD-10-CM | POA: Diagnosis present

## 2020-06-04 DIAGNOSIS — I10 Essential (primary) hypertension: Secondary | ICD-10-CM | POA: Diagnosis present

## 2020-06-04 DIAGNOSIS — Z8249 Family history of ischemic heart disease and other diseases of the circulatory system: Secondary | ICD-10-CM

## 2020-06-04 DIAGNOSIS — G9341 Metabolic encephalopathy: Secondary | ICD-10-CM | POA: Diagnosis present

## 2020-06-04 DIAGNOSIS — E440 Moderate protein-calorie malnutrition: Secondary | ICD-10-CM

## 2020-06-04 DIAGNOSIS — Z5900 Homelessness unspecified: Secondary | ICD-10-CM | POA: Diagnosis not present

## 2020-06-04 DIAGNOSIS — E876 Hypokalemia: Secondary | ICD-10-CM | POA: Diagnosis present

## 2020-06-04 DIAGNOSIS — F0390 Unspecified dementia without behavioral disturbance: Secondary | ICD-10-CM | POA: Diagnosis present

## 2020-06-04 DIAGNOSIS — E785 Hyperlipidemia, unspecified: Secondary | ICD-10-CM | POA: Diagnosis present

## 2020-06-04 DIAGNOSIS — N1831 Chronic kidney disease, stage 3a: Secondary | ICD-10-CM | POA: Diagnosis present

## 2020-06-04 DIAGNOSIS — F5101 Primary insomnia: Secondary | ICD-10-CM

## 2020-06-04 DIAGNOSIS — R4182 Altered mental status, unspecified: Secondary | ICD-10-CM

## 2020-06-04 DIAGNOSIS — X58XXXD Exposure to other specified factors, subsequent encounter: Secondary | ICD-10-CM | POA: Diagnosis present

## 2020-06-04 DIAGNOSIS — F200 Paranoid schizophrenia: Secondary | ICD-10-CM | POA: Diagnosis present

## 2020-06-04 DIAGNOSIS — S93324D Dislocation of tarsometatarsal joint of right foot, subsequent encounter: Secondary | ICD-10-CM

## 2020-06-04 DIAGNOSIS — R488 Other symbolic dysfunctions: Secondary | ICD-10-CM | POA: Diagnosis present

## 2020-06-04 DIAGNOSIS — Z888 Allergy status to other drugs, medicaments and biological substances status: Secondary | ICD-10-CM

## 2020-06-04 DIAGNOSIS — N400 Enlarged prostate without lower urinary tract symptoms: Secondary | ICD-10-CM | POA: Diagnosis present

## 2020-06-04 DIAGNOSIS — Z781 Physical restraint status: Secondary | ICD-10-CM

## 2020-06-04 DIAGNOSIS — Z86010 Personal history of colonic polyps: Secondary | ICD-10-CM

## 2020-06-04 DIAGNOSIS — D631 Anemia in chronic kidney disease: Secondary | ICD-10-CM | POA: Diagnosis present

## 2020-06-04 DIAGNOSIS — Z79899 Other long term (current) drug therapy: Secondary | ICD-10-CM

## 2020-06-04 DIAGNOSIS — G4700 Insomnia, unspecified: Secondary | ICD-10-CM | POA: Insufficient documentation

## 2020-06-04 DIAGNOSIS — F32A Depression, unspecified: Secondary | ICD-10-CM | POA: Diagnosis present

## 2020-06-04 DIAGNOSIS — Z880 Allergy status to penicillin: Secondary | ICD-10-CM

## 2020-06-04 DIAGNOSIS — I82551 Chronic embolism and thrombosis of right peroneal vein: Secondary | ICD-10-CM | POA: Diagnosis not present

## 2020-06-04 DIAGNOSIS — I130 Hypertensive heart and chronic kidney disease with heart failure and stage 1 through stage 4 chronic kidney disease, or unspecified chronic kidney disease: Secondary | ICD-10-CM | POA: Diagnosis present

## 2020-06-04 DIAGNOSIS — R4701 Aphasia: Secondary | ICD-10-CM | POA: Diagnosis present

## 2020-06-04 DIAGNOSIS — R4189 Other symptoms and signs involving cognitive functions and awareness: Secondary | ICD-10-CM | POA: Diagnosis present

## 2020-06-04 DIAGNOSIS — I82409 Acute embolism and thrombosis of unspecified deep veins of unspecified lower extremity: Secondary | ICD-10-CM | POA: Diagnosis present

## 2020-06-04 DIAGNOSIS — O2230 Deep phlebothrombosis in pregnancy, unspecified trimester: Secondary | ICD-10-CM

## 2020-06-04 DIAGNOSIS — D696 Thrombocytopenia, unspecified: Secondary | ICD-10-CM | POA: Diagnosis present

## 2020-06-04 DIAGNOSIS — I5030 Unspecified diastolic (congestive) heart failure: Secondary | ICD-10-CM | POA: Diagnosis present

## 2020-06-04 DIAGNOSIS — Z8619 Personal history of other infectious and parasitic diseases: Secondary | ICD-10-CM

## 2020-06-04 DIAGNOSIS — F419 Anxiety disorder, unspecified: Secondary | ICD-10-CM | POA: Insufficient documentation

## 2020-06-04 DIAGNOSIS — R609 Edema, unspecified: Secondary | ICD-10-CM | POA: Diagnosis not present

## 2020-06-04 DIAGNOSIS — I824Z1 Acute embolism and thrombosis of unspecified deep veins of right distal lower extremity: Secondary | ICD-10-CM

## 2020-06-04 DIAGNOSIS — Z9114 Patient's other noncompliance with medication regimen: Secondary | ICD-10-CM

## 2020-06-04 DIAGNOSIS — K739 Chronic hepatitis, unspecified: Secondary | ICD-10-CM | POA: Diagnosis present

## 2020-06-04 DIAGNOSIS — F17210 Nicotine dependence, cigarettes, uncomplicated: Secondary | ICD-10-CM | POA: Diagnosis present

## 2020-06-04 HISTORY — DX: Displaced fracture of fifth metatarsal bone, right foot, initial encounter for closed fracture: S92.351A

## 2020-06-04 HISTORY — DX: Other injury of unspecified body region, initial encounter: T14.8XXA

## 2020-06-04 LAB — AMMONIA: Ammonia: 21 umol/L (ref 9–35)

## 2020-06-04 LAB — RESPIRATORY PANEL BY RT PCR (FLU A&B, COVID)
Influenza A by PCR: NEGATIVE
Influenza B by PCR: NEGATIVE
SARS Coronavirus 2 by RT PCR: NEGATIVE

## 2020-06-04 LAB — CBC
HCT: 35.6 % — ABNORMAL LOW (ref 39.0–52.0)
Hemoglobin: 11.3 g/dL — ABNORMAL LOW (ref 13.0–17.0)
MCH: 29.4 pg (ref 26.0–34.0)
MCHC: 31.7 g/dL (ref 30.0–36.0)
MCV: 92.5 fL (ref 80.0–100.0)
Platelets: 124 10*3/uL — ABNORMAL LOW (ref 150–400)
RBC: 3.85 MIL/uL — ABNORMAL LOW (ref 4.22–5.81)
RDW: 14.2 % (ref 11.5–15.5)
WBC: 5 10*3/uL (ref 4.0–10.5)
nRBC: 0 % (ref 0.0–0.2)

## 2020-06-04 LAB — RAPID URINE DRUG SCREEN, HOSP PERFORMED
Amphetamines: NOT DETECTED
Barbiturates: NOT DETECTED
Benzodiazepines: NOT DETECTED
Cocaine: NOT DETECTED
Opiates: NOT DETECTED
Tetrahydrocannabinol: NOT DETECTED

## 2020-06-04 LAB — COMPREHENSIVE METABOLIC PANEL
ALT: 13 U/L (ref 0–44)
AST: 26 U/L (ref 15–41)
Albumin: 3.9 g/dL (ref 3.5–5.0)
Alkaline Phosphatase: 50 U/L (ref 38–126)
Anion gap: 12 (ref 5–15)
BUN: 17 mg/dL (ref 8–23)
CO2: 25 mmol/L (ref 22–32)
Calcium: 9.2 mg/dL (ref 8.9–10.3)
Chloride: 105 mmol/L (ref 98–111)
Creatinine, Ser: 1.36 mg/dL — ABNORMAL HIGH (ref 0.61–1.24)
GFR, Estimated: 53 mL/min — ABNORMAL LOW (ref >60)
Glucose, Bld: 83 mg/dL (ref 70–99)
Potassium: 3.6 mmol/L (ref 3.5–5.1)
Sodium: 142 mmol/L (ref 135–145)
Total Bilirubin: 1 mg/dL (ref 0.3–1.2)
Total Protein: 7.1 g/dL (ref 6.5–8.1)

## 2020-06-04 LAB — URINALYSIS, ROUTINE W REFLEX MICROSCOPIC
Bilirubin Urine: NEGATIVE
Glucose, UA: NEGATIVE mg/dL
Hgb urine dipstick: NEGATIVE
Ketones, ur: NEGATIVE mg/dL
Leukocytes,Ua: NEGATIVE
Nitrite: NEGATIVE
Protein, ur: NEGATIVE mg/dL
Specific Gravity, Urine: 1.017 (ref 1.005–1.030)
pH: 5 (ref 5.0–8.0)

## 2020-06-04 LAB — CBG MONITORING, ED: Glucose-Capillary: 68 mg/dL — ABNORMAL LOW (ref 70–99)

## 2020-06-04 LAB — ETHANOL: Alcohol, Ethyl (B): <10 mg/dL (ref <10)

## 2020-06-04 LAB — FOLATE: Folate: 36 ng/mL (ref >5.9)

## 2020-06-04 LAB — TSH: TSH: 0.694 u[IU]/mL (ref 0.350–4.500)

## 2020-06-04 LAB — VITAMIN B12: Vitamin B-12: 980 pg/mL — ABNORMAL HIGH (ref 180–914)

## 2020-06-04 MED ORDER — TAMSULOSIN HCL 0.4 MG PO CAPS
0.8000 mg | ORAL_CAPSULE | Freq: Every day | ORAL | Status: DC
  Administered 2020-06-04 – 2020-06-26 (×23): 0.8 mg via ORAL
  Filled 2020-06-04 (×24): qty 2

## 2020-06-04 MED ORDER — ENOXAPARIN SODIUM 80 MG/0.8ML ~~LOC~~ SOLN
1.0000 mg/kg | Freq: Two times a day (BID) | SUBCUTANEOUS | Status: DC
  Filled 2020-06-04: qty 0.65

## 2020-06-04 MED ORDER — NICOTINE 14 MG/24HR TD PT24: 14.0000 mg | MEDICATED_PATCH | Freq: Every day | TRANSDERMAL | Status: DC | PRN

## 2020-06-04 MED ORDER — FAMOTIDINE 20 MG PO TABS
20.0000 mg | ORAL_TABLET | Freq: Two times a day (BID) | ORAL | Status: DC
  Administered 2020-06-04 – 2020-06-26 (×45): 20 mg via ORAL
  Filled 2020-06-04 (×45): qty 1

## 2020-06-04 MED ORDER — ENOXAPARIN SODIUM 80 MG/0.8ML ~~LOC~~ SOLN
1.0000 mg/kg | Freq: Two times a day (BID) | SUBCUTANEOUS | Status: DC
  Administered 2020-06-04 – 2020-06-07 (×7): 65 mg via SUBCUTANEOUS
  Filled 2020-06-04 (×2): qty 0.8
  Filled 2020-06-04 (×2): qty 0.65
  Filled 2020-06-04: qty 0.8
  Filled 2020-06-04 (×2): qty 0.65
  Filled 2020-06-04: qty 0.8
  Filled 2020-06-04: qty 0.65

## 2020-06-04 MED ORDER — AMLODIPINE BESYLATE 5 MG PO TABS
10.0000 mg | ORAL_TABLET | Freq: Once | ORAL | Status: AC
  Administered 2020-06-04: 10 mg via ORAL
  Filled 2020-06-04: qty 2

## 2020-06-04 MED ORDER — ACETAMINOPHEN 325 MG PO TABS
650.0000 mg | ORAL_TABLET | Freq: Four times a day (QID) | ORAL | Status: DC | PRN
  Administered 2020-06-18 – 2020-06-24 (×3): 650 mg via ORAL
  Filled 2020-06-04 (×3): qty 2

## 2020-06-04 MED ORDER — ACETAMINOPHEN 650 MG RE SUPP: 650.0000 mg | Freq: Four times a day (QID) | RECTAL | Status: DC | PRN

## 2020-06-04 MED ORDER — FOLIC ACID 1 MG PO TABS
1.0000 mg | ORAL_TABLET | Freq: Every day | ORAL | Status: DC
  Administered 2020-06-04 – 2020-06-26 (×23): 1 mg via ORAL
  Filled 2020-06-04 (×23): qty 1

## 2020-06-04 MED ORDER — BENZTROPINE MESYLATE 1 MG PO TABS
1.0000 mg | ORAL_TABLET | Freq: Two times a day (BID) | ORAL | Status: DC
  Administered 2020-06-04 – 2020-06-26 (×45): 1 mg via ORAL
  Filled 2020-06-04 (×45): qty 1

## 2020-06-04 MED ORDER — AMLODIPINE BESYLATE 10 MG PO TABS
10.0000 mg | ORAL_TABLET | Freq: Every day | ORAL | Status: DC
  Administered 2020-06-05 – 2020-06-26 (×22): 10 mg via ORAL
  Filled 2020-06-04 (×17): qty 1
  Filled 2020-06-04: qty 2
  Filled 2020-06-04 (×4): qty 1

## 2020-06-04 NOTE — H&P (Signed)
[facility] Admission History and Physical Service Pager: [phone]  Patient name: [name] Medical record number: [id number] Date of birth: [birth year] Age: 69 y.o. Gender: male  Primary Care Provider: Danna Hefty, DO Consultants: Neuro, VVS Code Status: Full  Chief Complaint: RLE swelling/erythema, aphasia  Assessment and Plan: [name] is a 69 y.o. male presenting with RLE swelling and erythema + aphasia. PMH is significant for recent right foot crush injury s/p ORIF, primary schizophrenia, HFpEF, HTN, HLD, BPH, tobacco abuse, GERD, history of hep C s/p [place], history of hep B cleared.  Aphasia?   Schizophrenia: Patient is well known to [facility] system and presents  with complaint of RLE pain but was found to have aphasia worse from baseline per ED report. He was noted to be unable to answer questions or express any words. Unknown LKN. Patient does have a history of schizophrenia with echolalia and mumbling speach. Patient is resident of [facility] but has history of wandering off; last seen at facility on 10/15. On exam he is A&O x 4 with unremarkable neuro exam other than resting tremor of upper and lower extremities and sluggish pupils. Able to ambulate although slow with shuffling-like gait (per nursing is at baseline). He answers questions appropriately with occasional echolalia but does appear to have some difficulty with expressing words at times. Work up thus far unremarkable for cause including normal ammonia, ethanol level, CBC, CMP, UA, UDS, CT head, and MRI. CBG 68 on admission. Afebrile and hemodynamically stable on admission with elevated blood pressures. Home meds: Vistaril 50 mg QHS (not on med rec), Prolixin 25 mg injection q14days (last dose supposedly 10/8, next dose due 10/21), benztropine 1 mg BID. Last meds reportedly taken on 10/15, unclear when last injectable was given. Differential for patient's altered speech includes  antipsychotic withdrawal. Stroke ruled out. Lack of metabolic acidosis is reassuring for alternative ingestion. Seizure lower on differential however neuro consulted thus will defer ordering EEG pending there recs. Acute intoxication is also lower on the differential given negative results thus far and denial of use.  - admit to [facility]PTS, med-surg, attending Dr. Andria Frames - follow up neuro recs - restart Cogentin  - plan to recall [place] to inquire about injectable (request Earlean Polka, [phone]) - consider RPR, TSH, F74, salicylate, EEG if no improvement - up with assistance - PT/OT recs  RLE Swelling + Erythema c/f DVT: Patient presenting with acute change in RLE swelling and erythema that was not noted by prior physicians. Doppler study notable for chronic DVT involving right peroneal vein. Prior doppler study on 04/24/20 without mention of DVT.  RF for DVT include recent surgery. VVS was consulted for second opinion who felt that clot did appear chronic in nature however given lack of evidence of DVT on prior imaging, anticoagulation may be warranted. Will need to further assess risk/benefits and likelihood of compliance prior to discharge.  - Lovenox for DVT treatment per pharmacy - will further discuss with team regarding long term anticoagulation therapy - PT/OT recs - need to further evaluate fall risk  Thrombocytopenia: Chronic. History of low platelets from 100-200. Plts of 124 today.   - continue to monitor  CKD IIIa: Cr 1.36 on admission, baseline 1.3-1.4.  - continue to monitor  HTN: Elevated BP on admission, with max 190/95. Home meds: Amlodipine 10mg  QD. History of taking Losartan in past. Repeat BP 158/90. Likely elevated BP 2/2 medication noncompliance given last reported taking on 10/15. -  restart home Amlodipine - continue to monitor  Normocytic Anemia: Chronic, stable. Hgb 11.3 on admission, baseline 10-11. No signs of acute bleed. May be in setting of chronic  kidney disease. No anemia work up given stability. - continue to monitor - recommend outpatient colonoscopy and anemia workup - continue folic acid  Right foot fracture s/p ORIF (9/14):  H/o Lisfranc fracture s/p ORIF by [name] on 9/14. Saw [name] for follow up visit on 10/14 who recommended to continue with fracture boot with weight bearing as tolerated and follow up with repeat imaging in 4 weeks. Foot x-ray on admission notable for healing fractures of the second through fifth metatarsal. No interval change from the prior study. - continue fracture boot - WBAT - PT/OT  BPH:  Chronic and stable.  Medications include Flomax 0.8 mg daily. -Continue home Flomax  Nicotine dependence Patient has history of nicotine dependence, on previous admission was on nicotine patch consistently. -nicotine patch per request  FEN/GI: regular diet Prophylaxis: Lovenox tx dose for DVT  Disposition: med-surg  History of Present Illness:  [name] is a 69 y.o. male presenting with acute on chronic RLE pain and found to have erythema and swelling concerning for DVT. He was also noted to have aphasia with inability to communicate needs which is abnormal for patient. Patient is a frequent flyer to [facility] ED and very familiar to ED providers and nurses. Per there report he has baseline echolalia and mumbling speech but able to answer questions appropriately. He is able to ambulate slowly but without difficulty. He is a resident at [facility] and frequently wanders off. He was last seen at [facility] on 10/15. He presented to ED on 10/13 for RLE pain with unremarkable work up. Followed up with [name] on 10/14 with good report and plan for follow up in 4 weeks. He presented on 10/17 but left prior to being evaluated. He has a history of schizophrenia. He receives long acting Fluphenazine q14 days but unclear when he last received this. He has not taken his medications since he left on 10/15.   Review Of Systems:  Per HPI with the following additions:   Review of Systems  Unable to perform ROS: Psychiatric disorder  Psychiatric/Behavioral: Negative for hallucinations and substance abuse.    Patient Active Problem List   Diagnosis Date Noted   DVT (deep venous thrombosis) ([place]) 06/04/2020   Foot pain, right    AKI (acute kidney injury) ([place]) 05/12/2020   Lisfranc dislocation, right, initial encounter    Closed displaced fracture of fifth metatarsal bone of right foot    History of hepatitis B 04/26/2020   Chronic hepatitis ([place])    Crush injury    Dementia without behavioral disturbance ([place])    Fever of unknown origin    Fever 04/24/2020   Atrial fibrillation with RVR ([place]) 04/24/2020   Malnutrition of moderate degree 95/04/3266   Acute metabolic encephalopathy 12/45/8099   SIRS (systemic inflammatory response syndrome) ([place]) 04/23/2020   Acute encephalopathy 04/23/2020   GERD (gastroesophageal reflux disease) 05/03/2019   Hypotension 08/02/2018   Low back sprain, initial encounter 10/21/2017   History of drug abuse ([place]) 10/18/2017   Gingival erythema 09/23/2017   Chronic pain of left knee 11/24/2016   Tobacco abuse    Diastolic heart failure ([place]) 02/05/2015   Healthcare maintenance 05/28/2014   Hyperlipidemia 03/10/2014   Patellar tendonitis 11/16/2012   BRANCH RETINAL VEIN OCCLUSION 06/03/2010   PARANOID SCHIZOPHRENIA, CHRONIC 01/17/2009  HYPERTENSION, BENIGN ESSENTIAL 01/17/2009   BPH (benign prostatic hyperplasia) 01/17/2009   History of hepatitis C 12/14/2008   DEPRESSION 12/14/2008    Past Medical History: Past Medical History:  Diagnosis Date   BPH (benign prostatic hyperplasia)    Colon polyp 2010   Depression    GERD (gastroesophageal reflux disease)    Hepatitis C    "caught it when I had a blood transfusion"   High cholesterol    History of blood transfusion    "when I was young"   Hypertension    Paranoid  schizophrenia ([place])    Prostate atrophy    Retinal vein occlusion     Past Surgical History: Past Surgical History:  Procedure Laterality Date   CIRCUMCISION  1959   ORIF TOE FRACTURE Right 04/30/2020   Procedure: OPEN REDUCTION INTERNAL FIXATION (ORIF) RIGHT FOOT METATARSAL FRACTURES;  Surgeon: Newt Minion, MD;  Location: [facility];  Service: Orthopedics;  Laterality: Right;   TONSILLECTOMY      Social History: Social History   Tobacco Use   Smoking status: Current Every Day Smoker    Packs/day: 0.50    Years: 48.00    Pack years: 24.00    Types: Cigarettes   Smokeless tobacco: Never Used  Substance Use Topics   Alcohol use: Yes   Drug use: No   Additional social history:  Please also refer to relevant sections of EMR.  Family History: Family History  Problem Relation Age of Onset   Hypertension Mother    Diabetes Mother    Stroke Mother    Allergies and Medications: Allergies  Allergen Reactions   Lisinopril Other (See Comments)    Cough   Penicillins Hives   Amoxicillin Rash   Ibuprofen Rash   Current Facility-Administered Medications on File Prior to Encounter  Medication Dose Route Frequency Provider Last Rate Last Admin   fluPHENAZine decanoate (PROLIXIN) injection 25 mg  25 mg Intramuscular Q14 Days [place] Crane, MD   25 mg at 05/23/20 1638   Current Outpatient Medications on File Prior to Encounter  Medication Sig Dispense Refill   acetaminophen (TYLENOL) 325 MG tablet Take 2 tablets (650 mg total) by mouth every 6 (six) hours as needed for mild pain, moderate pain or headache. 30 tablet 0   amLODipine (NORVASC) 10 MG tablet Take 10 mg by mouth daily.     benztropine (COGENTIN) 1 MG tablet Take 1 tablet (1 mg total) by mouth 2 (two) times daily. 60 tablet 0   cetirizine (ZYRTEC ALLERGY) 10 MG tablet Take 1 tablet (10 mg total) by mouth daily. 30 tablet 1   famotidine (PEPCID) 20 MG tablet Take 20 mg by mouth 2 (two) times daily.      fluPHENAZine decanoate (PROLIXIN) 25 MG/ML injection Inject 1 mL (25 mg total) into the muscle every 14 (fourteen) days. Last dose 09/07/12 (Patient taking differently: Inject 25 mg into the muscle every 14 (fourteen) days. Last dose 05/09/2020) 5 mL 11   fluticasone (FLONASE) 50 MCG/ACT nasal spray Place 2 sprays into both nostrils daily.     folic acid (FOLVITE) 1 MG tablet Take 1 tablet (1 mg total) by mouth daily. 30 tablet 0   Multiple Vitamin (MULTIVITAMIN WITH MINERALS) TABS tablet Take 1 tablet by mouth daily. 30 tablet 6   polyethylene glycol (MIRALAX / GLYCOLAX) 17 g packet Take 17 g by mouth 2 (two) times daily.     tamsulosin (FLOMAX) 0.4 MG CAPS capsule TAKE 2 CAPSULE BY MOUTH DAILY (  Patient taking differently: Take 0.8 mg by mouth daily. ) 60 capsule 2   thiamine 100 MG tablet Take 1 tablet (100 mg total) by mouth daily. 30 tablet 0   vitamin C (ASCORBIC ACID) 500 MG tablet Take 500 mg by mouth daily.      [DISCONTINUED] loratadine (CLARITIN) 10 MG tablet Take 1 tablet (10 mg total) by mouth daily. 30 tablet 0    Objective: BP (!) 158/90    Pulse (!) 52    Temp 98 F (36.7 C) (Oral)    Resp 19    SpO2 100%  Exam: General: pleasant older gentleman, lying comfortably in ED bed, in no acute distress with non-toxic appearance HEENT: normocephalic, atraumatic, moist mucous membranes, oropharynx clear without erythema or exudate CV: regular rate and rhythm without murmurs, rubs, or gallops Lungs: clear to auscultation bilaterally with normal work of breathing on room air Resp: breathing comfortably on room air, speaking in full sentences Skin: warm, dry, thickened dry skin on RLE, incision of right foot well healing and closed without drainage or bleeding Extremities: warm and well perfused, normal tone; erythema and warmth along entire RLE distal to knee, pain to palpation to right calf, calf size 2-3cm bigger on R>L Mental Status: Awake, alert, interactive. A&Ox3. Normal eye  contact, responds to questions appropriately, speech mumbled and at times has difficulty expressing a word causing repetitive speech however most speech he is able to respond without difficulty, normal comprehension.  No receptive aphasia.  Neuro: CN II: PERRL although sluggish CN III, IV,VI: EOMI -  uncooperative  CV V: Normal sensation in V1, V2, V3  CVII: Symmetric smile and brow raise CN VIII: Normal hearing CN IX,X: Symmetric palate raise  CN XI: 5/5 shoulder shrug CN XII: Symmetric tongue protrusion  UE strength is 5/5 bilaterally  LE strength:  RLE 5/5 strength, left difficult to assess given difficulty following commands. Tone normal with ability to ambulate without assistance although slow with shuffling-like gait No ataxia with finger to nose      Labs and Imaging: CBC BMET  Recent Labs  Lab 06/04/20 0756  WBC 5.0  HGB 11.3*  HCT 35.6*  PLT 124*   Recent Labs  Lab 06/04/20 0756  NA 142  K 3.6  CL 105  CO2 25  BUN 17  CREATININE 1.36*  GLUCOSE 83  CALCIUM 9.2     CBG 68 Ethanol <10 Ammonia 21  Urinalysis    Component Value Date/Time   COLORURINE YELLOW 06/04/2020 1130   APPEARANCEUR HAZY (A) 06/04/2020 1130   LABSPEC 1.017 06/04/2020 1130   PHURINE 5.0 06/04/2020 1130   GLUCOSEU NEGATIVE 06/04/2020 1130   HGBUR NEGATIVE 06/04/2020 1130   HGBUR negative 08/27/2010 0854   BILIRUBINUR NEGATIVE 06/04/2020 1130   BILIRUBINUR negative 08/02/2018 1555   BILIRUBINUR NEG 07/27/2014 1518   KETONESUR NEGATIVE 06/04/2020 1130   PROTEINUR NEGATIVE 06/04/2020 1130   UROBILINOGEN 0.2 08/02/2018 1555   UROBILINOGEN 0.2 08/27/2010 0854   NITRITE NEGATIVE 06/04/2020 1130   LEUKOCYTESUR NEGATIVE 06/04/2020 1130    Drugs of Abuse     Component Value Date/Time   LABOPIA NONE DETECTED 06/04/2020 1130   COCAINSCRNUR NONE DETECTED 06/04/2020 1130   LABBENZ NONE DETECTED 06/04/2020 1130   AMPHETMU NONE DETECTED 06/04/2020 1130   THCU NONE DETECTED 06/04/2020  1130   LABBARB NONE DETECTED 06/04/2020 1130    CT Head Wo Contrast  Result Date: 06/04/2020 CLINICAL DATA:  Altered mental status EXAM: CT HEAD WITHOUT CONTRAST TECHNIQUE:  Contiguous axial images were obtained from the base of the skull through the vertex without intravenous contrast. COMPARISON:  05/12/2020 FINDINGS: Brain: No evidence of acute infarction, hemorrhage, hydrocephalus, extra-axial collection or mass lesion/mass effect. Moderate low-density changes within the periventricular and subcortical white matter compatible with chronic microvascular ischemic change. Mild diffuse cerebral volume loss. Vascular: Atherosclerotic calcifications involving the large vessels of the skull base. No unexpected hyperdense vessel. Skull: Normal. Negative for fracture or focal lesion. Sinuses/Orbits: No acute finding. Other: None. IMPRESSION: 1. No acute intracranial findings. 2. Chronic microvascular ischemic change and cerebral volume loss. Electronically Signed   By: Davina Poke D.O.   On: 06/04/2020 08:46   MR BRAIN WO CONTRAST  Result Date: 06/04/2020 CLINICAL DATA:  Neuro deficit, acute stroke suspected. EXAM: MRI HEAD WITHOUT CONTRAST TECHNIQUE: Multiplanar, multiecho pulse sequences of the brain and surrounding structures were obtained without intravenous contrast. COMPARISON:  CT head 05/12/2020 FINDINGS: Brain: No acute hemorrhage, hydrocephalus, extra-axial collection or mass lesion. There is field in homogeneity affecting the diffusion sequence without convincing focal restricted diffusion to suggest acute infarct. Moderate to advanced confluency periventricular and subcortical T2/FLAIR hyperintensities, compatible with chronic microvascular ischemic disease. Moderate to advanced diffuse cerebral atrophy with ex vacuo ventricular dilation. Vascular: Major proximal arterial flow voids are maintained at the skull base. Skull and upper cervical spine: Normal marrow signal. Sinuses/Orbits: Mild  scattered paranasal sinus mucosal thickening without air-fluid levels. Unremarkable orbits. Other: No mastoid effusions. IMPRESSION: 1. No acute intracranial abnormality. Specifically, no acute infarct. 2. Moderate to advanced chronic microvascular ischemic disease and generalized cerebral atrophy. Electronically Signed   By: Margaretha Sheffield MD   On: 06/04/2020 12:12   DG Foot 2 Views Right  Result Date: 06/04/2020 CLINICAL DATA:  Recent foot fracture with surgery.  Foot swelling. EXAM: RIGHT FOOT - 2 VIEW COMPARISON:  05/30/2020 FINDINGS: Healing fractures of the second, third, fourth, and fifth metatarsals unchanged. There is a screw across the fifth metatarsal fracture. There is a screw across the first tarsal metatarsal joint. There is a screw across the medial cuneiform into the second metatarsal unchanged. No new fracture.  No change from the recent study. IMPRESSION: Healing fractures of the second through fifth metatarsal. No interval change from the prior study. Electronically Signed   By: Franchot Gallo M.D.   On: 06/04/2020 08:21   VAS [place] LOWER EXTREMITY VENOUS (DVT) (MC and WL 7a-7p)  Result Date: 06/04/2020  Lower Venous DVT Study Indications: Edema.  Risk Factors: None identified. Limitations: Poor ultrasound/tissue interface and patient positioning, poor patient cooperation, patient movement. Comparison Study: No prior studies. Performing Technologist: Oliver Hum RVT  Examination Guidelines: A complete evaluation includes B-mode imaging, spectral Doppler, color Doppler, and power Doppler as needed of all accessible portions of each vessel. Bilateral testing is considered an integral part of a complete examination. Limited examinations for reoccurring indications may be performed as noted. The reflux portion of the exam is performed with the patient in reverse Trendelenburg.  +---------+---------------+---------+-----------+----------+--------------+  RIGHT      Compressibility Phasicity Spontaneity Properties Thrombus Aging  +---------+---------------+---------+-----------+----------+--------------+  CFV       Full            Yes       Yes                                    +---------+---------------+---------+-----------+----------+--------------+  SFJ  Full                                                             +---------+---------------+---------+-----------+----------+--------------+  FV Prox   Full                                                             +---------+---------------+---------+-----------+----------+--------------+  FV Mid    Full                                                             +---------+---------------+---------+-----------+----------+--------------+  FV Distal Full                                                             +---------+---------------+---------+-----------+----------+--------------+  PFV       Full                                                             +---------+---------------+---------+-----------+----------+--------------+  POP       Full            Yes       Yes                                    +---------+---------------+---------+-----------+----------+--------------+  PTV       Full                                                             +---------+---------------+---------+-----------+----------+--------------+  PERO      Partial                                          Chronic         +---------+---------------+---------+-----------+----------+--------------+   +---------+---------------+---------+-----------+----------+--------------+  LEFT      Compressibility Phasicity Spontaneity Properties Thrombus Aging  +---------+---------------+---------+-----------+----------+--------------+  CFV       Full            Yes       Yes                                    +---------+---------------+---------+-----------+----------+--------------+  SFJ       Full                                                              +---------+---------------+---------+-----------+----------+--------------+  FV Prox   Full                                                             +---------+---------------+---------+-----------+----------+--------------+  FV Mid    Full                                                             +---------+---------------+---------+-----------+----------+--------------+  FV Distal Full                                                             +---------+---------------+---------+-----------+----------+--------------+  PFV       Full                                                             +---------+---------------+---------+-----------+----------+--------------+  POP       Full            Yes       Yes                                    +---------+---------------+---------+-----------+----------+--------------+  PTV       Full                                                             +---------+---------------+---------+-----------+----------+--------------+  PERO      Full                                                             +---------+---------------+---------+-----------+----------+--------------+     Summary: RIGHT: - Findings consistent with chronic deep vein thrombosis involving the right peroneal veins. - No cystic structure found in the popliteal fossa.  LEFT: - There is no evidence of deep vein thrombosis in the lower extremity. However, portions of this examination were limited- see technologist comments above.  *See table(s)  above for measurements and observations.    Preliminary     Danna Hefty, DO 06/04/2020, 2:11 PM PGY-3, [facility] pager: [phone], text pages welcome

## 2020-06-04 NOTE — Progress Notes (Signed)
Bilateral lower extremity venous duplex has been completed. Preliminary results can be found in CV Proc through chart review.  Results were given to Dr. Jeanell Sparrow.  06/04/20 10:34 AM Carlos Levering RVT

## 2020-06-04 NOTE — Progress Notes (Signed)
ANTICOAGULATION CONSULT NOTE - Initial Consult  Pharmacy Consult for lovenox Indication: DVT  Allergies  Allergen Reactions  . Lisinopril Other (See Comments)    Cough  . Penicillins Hives  . Amoxicillin Rash  . Ibuprofen Rash    Patient Measurements:    Vital Signs: Temp: 98 F (36.7 C) (10/19 0848) Temp Source: Oral (10/19 0848) BP: 177/80 (10/19 1045) Pulse Rate: 52 (10/19 1045)  Labs: Recent Labs    06/04/20 0756  HGB 11.3*  HCT 35.6*  PLT 124*  CREATININE 1.36*    Estimated Creatinine Clearance: 47.7 mL/min (A) (by C-G formula based on SCr of 1.36 mg/dL (H)).   Medical History: Past Medical History:  Diagnosis Date  . BPH (benign prostatic hyperplasia)   . Colon polyp 2010  . Depression   . GERD (gastroesophageal reflux disease)   . Hepatitis C    "caught it when I had a blood transfusion"  . High cholesterol   . History of blood transfusion    "when I was young"  . Hypertension   . Paranoid schizophrenia ([place])   . Prostate atrophy   . Retinal vein occlusion    Assessment: 29 yom presented to the ED with leg pain. Found to have a chronic DVT. To start lovenox. MRI brain was negative for acute stroke. Baseline Hgb is slightly low at 11.3 and platelets are low at 124. He is not on anticoagulation PTA.   Goal of Therapy:  Anti-Xa level 0.6-1 units/ml 4hrs after LMWH dose given Monitor platelets by anticoagulation protocol: Yes   Plan:  Lovenox 65mg  SQ Q12H F/u CBC, S&S of bleeding  Salome Arnt, PharmD, BCPS Clinical Pharmacist Please see AMION for all pharmacy numbers 06/04/2020 12:28 PM

## 2020-06-04 NOTE — Procedures (Signed)
Patient Name: OBIE KALLENBACH  MRN: [id number]  Epilepsy Attending: Lora Havens  Referring Physician/Provider: Etta Quill, PA Date: 06/04/2020 Duration: 24.24 mins  Patient history: 69yo M with ams. EEG to valuate for seizure.   Level of alertness: Awake, asleep  AEDs during EEG study: None  Technical aspects: This EEG study was done with scalp electrodes positioned according to the 10-20 International system of electrode placement. Electrical activity was acquired at a sampling rate of 500Hz  and reviewed with a high frequency filter of 70Hz  and a low frequency filter of 1Hz . EEG data were recorded continuously and digitally stored.   Description: The posterior dominant rhythm consists of 9 Hz activity of moderate voltage (25-35 uV) seen predominantly in posterior head regions, symmetric and reactive to eye opening and eye closing.  Sleep was characterized by vertex waves, sleep spindles (12 to 14 Hz), maximal frontocentral region. EEG showed continuous generalized 3-6hz  theta-delta slowing.  Hyperventilation and photic stimulation were not performed.     ABNORMALITY -Continuous slow, generalized  IMPRESSION: This study is suggestive of mild diffuse encephalopathy, nonspecific etiology. No seizures or epileptiform discharges were seen throughout the recording.  Dakiyah Heinke Barbra Sarks

## 2020-06-04 NOTE — Progress Notes (Signed)
EEG complete - results pending 

## 2020-06-04 NOTE — Progress Notes (Signed)
Spoke with [name] regarding the last time pt had his injectable prolixin.  She states it was 'last week' on 10/8.  Next dose due on 10/21.     Clemetine Marker, MD PGY3

## 2020-06-04 NOTE — ED Notes (Addendum)
Repositioned pt for the second time. Pt keeps climbing out of the bed and taking cords off. I have reattached the pt to the monitor and got him positioned up in the bed right. Pt has also had meal. Pt has had brief changed as well.

## 2020-06-04 NOTE — ED Provider Notes (Addendum)
[facility]PART[facility] Provider Note   CSN: [id number] Arrival date & time: 06/04/20  0636     History Chief Complaint  Patient presents with  . Foot Pain    [name] is a 69 y.o. male.  HPI Level 5 caveat patient unable to speak     69 yo male ho schizophrenia, hypertension, s/p repaif of Lisfranc fx of righ tfoot September 14 with stated cc of foot pain.  However, on my evaluation the patient is not able to tell me why he is here LKN unknown  Past Medical History:  Diagnosis Date  . BPH (benign prostatic hyperplasia)   . Colon polyp 2010  . Depression   . GERD (gastroesophageal reflux disease)   . Hepatitis C    "caught it when I had a blood transfusion"  . High cholesterol   . History of blood transfusion    "when I was young"  . Hypertension   . Paranoid schizophrenia ([place])   . Prostate atrophy   . Retinal vein occlusion     Patient Active Problem List   Diagnosis Date Noted  . Foot pain, right   . AKI (acute kidney injury) ([place]) 05/12/2020  . Lisfranc dislocation, right, initial encounter   . Closed displaced fracture of fifth metatarsal bone of right foot   . History of hepatitis B 04/26/2020  . Chronic hepatitis ([place])   . Crush injury   . Dementia without behavioral disturbance ([place])   . Fever of unknown origin   . Fever 04/24/2020  . Atrial fibrillation with RVR ([place]) 04/24/2020  . Malnutrition of moderate degree 04/24/2020  . Acute metabolic encephalopathy 32/44/0102  . SIRS (systemic inflammatory response syndrome) ([place]) 04/23/2020  . Acute encephalopathy 04/23/2020  . GERD (gastroesophageal reflux disease) 05/03/2019  . Hypotension 08/02/2018  . Low back sprain, initial encounter 10/21/2017  . History of drug abuse ([place]) 10/18/2017  . Gingival erythema 09/23/2017  . Chronic pain of left knee 11/24/2016  . Tobacco abuse   . Diastolic heart failure ([place]) 02/05/2015  . Healthcare maintenance 05/28/2014  .  Hyperlipidemia 03/10/2014  . Patellar tendonitis 11/16/2012  . [place] OCCLUSION 06/03/2010  . PARANOID SCHIZOPHRENIA, CHRONIC 01/17/2009  . HYPERTENSION, BENIGN ESSENTIAL 01/17/2009  . BPH (benign prostatic hyperplasia) 01/17/2009  . History of hepatitis C 12/14/2008  . DEPRESSION 12/14/2008    Past Surgical History:  Procedure Laterality Date  . CIRCUMCISION  1959  . ORIF TOE FRACTURE Right 04/30/2020   Procedure: OPEN REDUCTION INTERNAL FIXATION (ORIF) RIGHT FOOT METATARSAL FRACTURES;  Surgeon: Newt Minion, MD;  Location: [facility];  Service: Orthopedics;  Laterality: Right;  . TONSILLECTOMY         Family History  Problem Relation Age of Onset  . Hypertension Mother   . Diabetes Mother   . Stroke Mother     Social History   Tobacco Use  . Smoking status: Current Every Day Smoker    Packs/day: 0.50    Years: 48.00    Pack years: 24.00    Types: Cigarettes  . Smokeless tobacco: Never Used  Substance Use Topics  . Alcohol use: Yes  . Drug use: No    Home Medications Prior to Admission medications   Medication Sig Start Date End Date Taking? Authorizing Provider  acetaminophen (TYLENOL) 325 MG tablet Take 2 tablets (650 mg total) by mouth every 6 (six) hours as needed for mild pain, moderate pain or headache. 05/09/20   Mina Marble  P, DO  amLODipine (NORVASC) 10 MG tablet Take 10 mg by mouth daily. 05/10/20   [provider]  benztropine (COGENTIN) 1 MG tablet Take 1 tablet (1 mg total) by mouth 2 (two) times daily. 02/26/20   Salley Slaughter, NP  cetirizine (ZYRTEC ALLERGY) 10 MG tablet Take 1 tablet (10 mg total) by mouth daily. 02/01/20   [name], [name], PA-C  famotidine (PEPCID) 20 MG tablet Take 20 mg by mouth 2 (two) times daily. 05/10/20   [provider]  fluPHENAZine decanoate (PROLIXIN) 25 MG/ML injection Inject 1 mL (25 mg total) into the muscle every 14 (fourteen) days. Last dose 09/07/12 Patient taking differently: Inject 25  mg into the muscle every 14 (fourteen) days. Last dose 05/09/2020 02/26/20   Salley Slaughter, NP  fluticasone (FLONASE) 50 MCG/ACT nasal spray Place 2 sprays into both nostrils daily. 05/10/20   [provider]  folic acid (FOLVITE) 1 MG tablet Take 1 tablet (1 mg total) by mouth daily. 05/09/20   Mullis, Kiersten P, DO  Multiple Vitamin (MULTIVITAMIN WITH MINERALS) TABS tablet Take 1 tablet by mouth daily. 06/28/14   [name], MD  tamsulosin (FLOMAX) 0.4 MG CAPS capsule TAKE 2 CAPSULE BY MOUTH DAILY Patient taking differently: Take 0.8 mg by mouth daily.  02/23/20   Mullis, Kiersten P, DO  thiamine 100 MG tablet Take 1 tablet (100 mg total) by mouth daily. 05/09/20   Mullis, Kiersten P, DO  vitamin C (ASCORBIC ACID) 500 MG tablet Take 500 mg by mouth daily.     [provider]  loratadine (CLARITIN) 10 MG tablet Take 1 tablet (10 mg total) by mouth daily. 12/28/19 02/05/20  [name], DO    Allergies    Lisinopril, Penicillins, Amoxicillin, and Ibuprofen  Review of Systems   Review of Systems  Unable to perform ROS: Other  Endocrine: Positive for polydipsia.    Physical Exam Updated Vital Signs BP (!) 166/94 (BP Location: Right Arm)   Pulse 73   Temp 98 F (36.7 C) (Oral)   Resp 16   SpO2 100%   Physical Exam Vitals and nursing note reviewed.  Constitutional:      Appearance: Normal appearance.  HENT:     Head: Normocephalic.     Right Ear: External ear normal.     Left Ear: External ear normal.     Nose: Nose normal.     Mouth/Throat:     Pharynx: Oropharynx is clear.  Eyes:     Pupils: Pupils are equal, round, and reactive to light.  Cardiovascular:     Rate and Rhythm: Normal rate and regular rhythm.     Pulses: Normal pulses.  Pulmonary:     Effort: Pulmonary effort is normal.     Breath sounds: Normal breath sounds.  Abdominal:     General: Abdomen is flat.  Musculoskeletal:     Cervical back: Normal range of motion.     Comments:  Right foot erythematous dp pulses intact No wounds No point ttp noted  Skin:    General: Skin is warm and dry.     Capillary Refill: Capillary refill takes less than 2 seconds.  Neurological:     Mental Status: He is alert.     Cranial Nerves: No cranial nerve deficit.     Motor: No weakness.     Deep Tendon Reflexes: Reflexes are normal and symmetric.     Comments: aphasia     ED Results / Procedures /  Treatments   Labs (all labs ordered are listed, but only abnormal results are displayed) Labs Reviewed  CBC  RAPID URINE DRUG SCREEN, HOSP PERFORMED  ETHANOL  COMPREHENSIVE METABOLIC PANEL  AMMONIA    EKG EKG Interpretation  Date/Time:  Tuesday June 04 2020 10:36:27 EDT Ventricular Rate:  65 PR Interval:    QRS Duration: 94 QT Interval:  448 QTC Calculation: 466 R Axis:   82 Text Interpretation: Normal sinus rhythm No significant change was found since last tracing of 05/12/20 Confirmed by Pattricia Boss [phone]) on 06/04/2020 10:45:33 AM   Radiology DG Foot 2 Views Right  Result Date: 06/04/2020 CLINICAL DATA:  Recent foot fracture with surgery.  Foot swelling. EXAM: RIGHT FOOT - 2 VIEW COMPARISON:  05/30/2020 FINDINGS: Healing fractures of the second, third, fourth, and fifth metatarsals unchanged. There is a screw across the fifth metatarsal fracture. There is a screw across the first tarsal metatarsal joint. There is a screw across the medial cuneiform into the second metatarsal unchanged. No new fracture.  No change from the recent study. IMPRESSION: Healing fractures of the second through fifth metatarsal. No interval change from the prior study. Electronically Signed   By: Franchot Gallo M.D.   On: 06/04/2020 08:21    Procedures Procedures (including critical care time)  Medications Ordered in ED Medications - No data to display  ED Course  I have reviewed the triage vital signs and the nursing notes.  Pertinent labs & imaging results that were available  during my care of the patient were reviewed by me and considered in my medical decision making (see chart for details).  Clinical Course as of Jun 04 1000  Tue Jun 04, 2020  1001 Verbal report from tech report, and perineal clot that appears chronicReviewed ultrasound from September 7 the does not show any evidence of clotPlan consult with vascular surgeon   [DR]    Clinical Course User Index [DR] Pattricia Boss, MD   MDM Rules/Calculators/A&P                          1- encephalopathy- does not appear to have focal deficit, but aphasia Neuro being consulted.   2- right lower leg swelling with clot noted on [place] thought by tech to be chronic- reviewed doppler 04/23/20 with no evidence of clot.  Paged Dr. Donnetta Hutching - in [place], will call back No clot noted 9/8, but appears to have clot today. CHEST guidelines would suggest anticoagulation, but risks may outweigh benefits given patients other chronic problems.  Will hold anticoagulation pending mri brain.  Discussed with Dr. Tarry Kos, [facility] residency, who will see for admission Attempted to call wife- number disconnected Final Clinical Impression(s) / ED Diagnoses Final diagnoses:  Acute encephalopathy  DVT (deep vein thrombosis) in pregnancy    Rx / DC Orders ED Discharge Orders    None       Pattricia Boss, MD 06/04/20 1136    Pattricia Boss, MD 06/04/20 1145

## 2020-06-04 NOTE — ED Triage Notes (Signed)
Patient reports left foot pain onset yesterday , denies injury/ambulatory.

## 2020-06-04 NOTE — Progress Notes (Signed)
FPTS Interim Progress Note  S: Evaluated patient at bedside.  Patient without any concerns at this time.  Denies any pain.  O: BP (!) 152/82 (BP Location: Left Arm)   Pulse 77   Temp 98 F (36.7 C) (Oral)   Resp 16   SpO2 100%   Gen: lying comfortably in bed, NAD CV: normal rate, irregular rhythm, no murmurs Resp: CTAB anteriorly, breathing comfortably on room air Neuro: A&Ox2 (able to state name, DOB, and knows he is in the hospital, unable to state year), tremor noted in left upper and lower extremities, speech with stuttering and mumbling sounds Ext: right lower extremity with warmth and swelling compared to the left  A/P: No changes to current plan.  Zola Button, MD 06/04/2020, 9:45 PM PGY-1, [facility] Service pager [phone]

## 2020-06-04 NOTE — ED Notes (Signed)
Pt in MRI.

## 2020-06-04 NOTE — ED Notes (Signed)
Notified Dr. Jeanell Sparrow of pt's elevated BP's. Last BP 190/95.

## 2020-06-04 NOTE — Progress Notes (Signed)
Office Visit Note   Patient: [name]           Date of Birth: 1951-07-09           MRN: [id number] Visit Date: 05/30/2020              Requested by: Danna Hefty, DO [address],  [place] [address] PCP: Danna Hefty, DO  Chief Complaint  Patient presents with  . Right Foot - Routine Post Op    04/30/20 right foot ORIF MT fx       HPI: Patient is a 69 year old gentleman who is seen in follow-up 1 month status post internal fixation for Lisfranc fracture base of the fifth metatarsal fracture which were treated with internal fixation patient also had closed nondisplaced fractures of the metatarsal shaft 2 3 and 4 that were treated nonoperatively.  Patient went to the emergency room yesterday complaining of pain but states he has no symptoms today he is full weightbearing in a fracture boot the sutures are in place.  Assessment & Plan: Visit Diagnoses:  1. Pain in right foot   2. Closed fracture of right foot, sequela     Plan: Continue with the fracture boot weightbearing as tolerated  Three-view radiographs of the right foot at follow-up  Repeat three-view radiographs of the right foot at follow-up.  Follow-Up Instructions: Return in about 4 weeks (around 06/27/2020).   Ortho Exam  Patient is alert, oriented, no adenopathy, well-dressed, normal affect, normal respiratory effort. Examination the wound is well-healed there is no redness no cellulitis no drainage no signs of infection.  Harvest sutures today.  Imaging: CT Head Wo Contrast  Result Date: 06/04/2020 CLINICAL DATA:  Altered mental status EXAM: CT HEAD WITHOUT CONTRAST TECHNIQUE: Contiguous axial images were obtained from the base of the skull through the vertex without intravenous contrast. COMPARISON:  05/12/2020 FINDINGS: Brain: No evidence of acute infarction, hemorrhage, hydrocephalus, extra-axial collection or mass lesion/mass effect. Moderate low-density changes within the  periventricular and subcortical white matter compatible with chronic microvascular ischemic change. Mild diffuse cerebral volume loss. Vascular: Atherosclerotic calcifications involving the large vessels of the skull base. No unexpected hyperdense vessel. Skull: Normal. Negative for fracture or focal lesion. Sinuses/Orbits: No acute finding. Other: None. IMPRESSION: 1. No acute intracranial findings. 2. Chronic microvascular ischemic change and cerebral volume loss. Electronically Signed   By: Davina Poke D.O.   On: 06/04/2020 08:46   DG Foot 2 Views Right  Result Date: 06/04/2020 CLINICAL DATA:  Recent foot fracture with surgery.  Foot swelling. EXAM: RIGHT FOOT - 2 VIEW COMPARISON:  05/30/2020 FINDINGS: Healing fractures of the second, third, fourth, and fifth metatarsals unchanged. There is a screw across the fifth metatarsal fracture. There is a screw across the first tarsal metatarsal joint. There is a screw across the medial cuneiform into the second metatarsal unchanged. No new fracture.  No change from the recent study. IMPRESSION: Healing fractures of the second through fifth metatarsal. No interval change from the prior study. Electronically Signed   By: Franchot Gallo M.D.   On: 06/04/2020 08:21   No images are attached to the encounter.  Labs: Lab Results  Component Value Date   HGBA1C 5.4 08/21/2016   HGBA1C 5.3 05/15/2015   HGBA1C 5.4 05/28/2014   ESRSEDRATE 3 04/24/2020   CRP 7.3 (H) 04/24/2020   REPTSTATUS 05/13/2020 FINAL 05/12/2020   CULT MULTIPLE SPECIES PRESENT, SUGGEST RECOLLECTION (A) 05/12/2020   LABORGA STAPHYLOCOCCUS SPECIES (COAGULASE  NEGATIVE) 07/27/2014     Lab Results  Component Value Date   ALBUMIN 3.9 06/04/2020   ALBUMIN 4.0 05/12/2020   ALBUMIN 3.4 (L) 04/26/2020    Lab Results  Component Value Date   MG 1.7 04/24/2020   MG 1.9 04/23/2020   No results found for: VD25OH  No results found for: PREALBUMIN CBC EXTENDED Latest Ref Rng & Units  06/04/2020 05/16/2020 05/12/2020  WBC 4.0 - 10.5 K/uL 5.0 3.8(L) 6.8  RBC 4.22 - 5.81 MIL/uL 3.85(L) 4.02(L) 3.63(L)  HGB 13.0 - 17.0 g/dL 11.3(L) 11.9(L) 10.7(L)  HCT 39 - 52 % 35.6(L) 36.6(L) 33.4(L)  PLT 150 - 400 K/uL 124(L) 166 214  NEUTROABS 1.7 - 7.7 K/uL - - -  LYMPHSABS 0.7 - 4.0 K/uL - - -     Body mass index is 21.12 kg/m.  Orders:  Orders Placed This Encounter  Procedures  . XR Foot Complete Right   No orders of the defined types were placed in this encounter.    Procedures: No procedures performed  Clinical Data: No additional findings.  ROS:  All other systems negative, except as noted in the HPI. Review of Systems  Objective: Vital Signs: Ht 5\' 9"  (1.753 m)   Wt 143 lb (64.9 kg)   BMI 21.12 kg/m   Specialty Comments:  No specialty comments available.  PMFS History: Patient Active Problem List   Diagnosis Date Noted  . Foot pain, right   . AKI (acute kidney injury) ([place]) 05/12/2020  . Lisfranc dislocation, right, initial encounter   . Closed displaced fracture of fifth metatarsal bone of right foot   . History of hepatitis B 04/26/2020  . Chronic hepatitis ([place])   . Crush injury   . Dementia without behavioral disturbance ([place])   . Fever of unknown origin   . Fever 04/24/2020  . Atrial fibrillation with RVR ([place]) 04/24/2020  . Malnutrition of moderate degree 04/24/2020  . Acute metabolic encephalopathy 16/05/9603  . SIRS (systemic inflammatory response syndrome) ([place]) 04/23/2020  . Acute encephalopathy 04/23/2020  . GERD (gastroesophageal reflux disease) 05/03/2019  . Hypotension 08/02/2018  . Low back sprain, initial encounter 10/21/2017  . History of drug abuse ([place]) 10/18/2017  . Gingival erythema 09/23/2017  . Chronic pain of left knee 11/24/2016  . Tobacco abuse   . Diastolic heart failure ([place]) 02/05/2015  . Healthcare maintenance 05/28/2014  . Hyperlipidemia 03/10/2014  . Patellar tendonitis 11/16/2012  . [place]  OCCLUSION 06/03/2010  . PARANOID SCHIZOPHRENIA, CHRONIC 01/17/2009  . HYPERTENSION, BENIGN ESSENTIAL 01/17/2009  . BPH (benign prostatic hyperplasia) 01/17/2009  . History of hepatitis C 12/14/2008  . DEPRESSION 12/14/2008   Past Medical History:  Diagnosis Date  . BPH (benign prostatic hyperplasia)   . Colon polyp 2010  . Depression   . GERD (gastroesophageal reflux disease)   . Hepatitis C    "caught it when I had a blood transfusion"  . High cholesterol   . History of blood transfusion    "when I was young"  . Hypertension   . Paranoid schizophrenia ([place])   . Prostate atrophy   . Retinal vein occlusion     Family History  Problem Relation Age of Onset  . Hypertension Mother   . Diabetes Mother   . Stroke Mother     Past Surgical History:  Procedure Laterality Date  . CIRCUMCISION  1959  . ORIF TOE FRACTURE Right 04/30/2020   Procedure: OPEN REDUCTION INTERNAL FIXATION (ORIF) RIGHT FOOT METATARSAL FRACTURES;  Surgeon: Newt Minion, MD;  Location: [facility];  Service: Orthopedics;  Laterality: Right;  . TONSILLECTOMY     Social History   Occupational History  . Not on file  Tobacco Use  . Smoking status: Current Every Day Smoker    Packs/day: 0.50    Years: 48.00    Pack years: 24.00    Types: Cigarettes  . Smokeless tobacco: Never Used  Substance and Sexual Activity  . Alcohol use: Yes  . Drug use: No  . Sexual activity: Yes

## 2020-06-04 NOTE — Consult Note (Addendum)
Neurology Consultation  Reason for Consult: Altered mental status Referring Physician: Zenia Resides, MD  CC: Altered mental status  History is obtained from: Chart  HPI: [name] is a 69 y.o. male with history of retinal vein occlusion, paranoid schizophrenia, hypertension, hypercholesterolemia, and depression. Patient initially came to the ED for foot pain per triage notes. On examination by ED physician it was noted that patient was not clearly expressing himself which is not normal for this patient as he has been seen in the ED multiple times. Due to his change in mentation from baseline neurology was asked to evaluate patient. There was some question of expressive aphasia. Patient did obtain MRI brain which was negative. On examination patient is moving all extremities antigravity but when asked questions it is intermittent as far as speaking clearly versus repetition of words or difficulty expressing himself. Even in the 10 minutes of consultation at times he is able to give me a straight sentence, name objects however within two more minutes it is noticed that he is having difficulty expressing what he wants to say.  While patient has been in the emergency department labs are as follows: -UA negative -Drug screen negative -Ammonia within normal limits -Creatinine 1.36 however that is not far off of his norm in which 2 weeks ago it was 1.46 and 3 weeks ago was 1.30 -Ethanol less than 10 -CBC shows no leukocytosis   Past Medical History:  Diagnosis Date  . BPH (benign prostatic hyperplasia)   . Colon polyp 2010  . Depression   . GERD (gastroesophageal reflux disease)   . Hepatitis C    "caught it when I had a blood transfusion"  . High cholesterol   . History of blood transfusion    "when I was young"  . Hypertension   . Paranoid schizophrenia ([place])   . Prostate atrophy   . Retinal vein occlusion     Family History  Problem Relation Age of Onset  . Hypertension  Mother   . Diabetes Mother   . Stroke Mother    Social History:   reports that he has been smoking cigarettes. He has a 24.00 pack-year smoking history. He has never used smokeless tobacco. He reports current alcohol use. He reports that he does not use drugs.  Medications  Current Facility-Administered Medications:  .  enoxaparin (LOVENOX) injection 65 mg, 1 mg/kg, Subcutaneous, BID, [place], Valeda Malm, RPH, 65 mg at 06/04/20 1231 .  fluPHENAZine decanoate (PROLIXIN) injection 25 mg, 25 mg, Intramuscular, Q14 Days, [place] Crane, MD, 25 mg at 05/23/20 1638  Current Outpatient Medications:  .  acetaminophen (TYLENOL) 325 MG tablet, Take 2 tablets (650 mg total) by mouth every 6 (six) hours as needed for mild pain, moderate pain or headache., Disp: 30 tablet, Rfl: 0 .  amLODipine (NORVASC) 10 MG tablet, Take 10 mg by mouth daily., Disp: , Rfl:  .  benztropine (COGENTIN) 1 MG tablet, Take 1 tablet (1 mg total) by mouth 2 (two) times daily., Disp: 60 tablet, Rfl: 0 .  cetirizine (ZYRTEC ALLERGY) 10 MG tablet, Take 1 tablet (10 mg total) by mouth daily., Disp: 30 tablet, Rfl: 1 .  famotidine (PEPCID) 20 MG tablet, Take 20 mg by mouth 2 (two) times daily., Disp: , Rfl:  .  fluPHENAZine decanoate (PROLIXIN) 25 MG/ML injection, Inject 1 mL (25 mg total) into the muscle every 14 (fourteen) days. Last dose 09/07/12 (Patient taking differently: Inject 25 mg into the muscle every 14 (fourteen) days.  Last dose 05/09/2020), Disp: 5 mL, Rfl: 11 .  fluticasone (FLONASE) 50 MCG/ACT nasal spray, Place 2 sprays into both nostrils daily., Disp: , Rfl:  .  folic acid (FOLVITE) 1 MG tablet, Take 1 tablet (1 mg total) by mouth daily., Disp: 30 tablet, Rfl: 0 .  Multiple Vitamin (MULTIVITAMIN WITH MINERALS) TABS tablet, Take 1 tablet by mouth daily., Disp: 30 tablet, Rfl: 6 .  polyethylene glycol (MIRALAX / GLYCOLAX) 17 g packet, Take 17 g by mouth 2 (two) times daily., Disp: , Rfl:  .  tamsulosin (FLOMAX) 0.4 MG  CAPS capsule, TAKE 2 CAPSULE BY MOUTH DAILY (Patient taking differently: Take 0.8 mg by mouth daily. ), Disp: 60 capsule, Rfl: 2 .  thiamine 100 MG tablet, Take 1 tablet (100 mg total) by mouth daily., Disp: 30 tablet, Rfl: 0 .  vitamin C (ASCORBIC ACID) 500 MG tablet, Take 500 mg by mouth daily. , Disp: , Rfl:   ROS:  Unable to obtain due to altered mental status.    Exam: Current vital signs: BP (!) 158/90   Pulse (!) 52   Temp 98 F (36.7 C) (Oral)   Resp 19   SpO2 100%  Vital signs in last 24 hours: Temp:  [98 F (36.7 C)] 98 F (36.7 C) (10/19 0848) Pulse Rate:  [52-73] 52 (10/19 1045) Resp:  [13-20] 19 (10/19 1245) BP: (158-190)/(80-103) 158/90 (10/19 1245) SpO2:  [100 %] 100 % (10/19 1045)   Constitutional: Appears well-developed and well-nourished.  Eyes: No scleral injection HENT: No OP obstrucion Head: Normocephalic.  Cardiovascular: Normal rate and regular rhythm.  Respiratory: Effort normal, non-labored breathing GI: Soft.  No distension. There is no tenderness.  Skin: WDI  Neuro: Mental Status: Patient is awake, alert, not oriented to place however is able to follow simple commands. When asked to name objects at times he is able to name a pen and what it is used for however other times when I asked him what my thumb is he says "up ". Very easily distracted. Unable to give coherent history. Cranial Nerves: II: Visual Fields are full.  III,IV, VI: EOMI without ptosis or diploplia. Pupils equal, round and reactive to light V: Facial sensation is symmetric to temperature VII: Possible left facial droop VIII: hearing is intact to voice X: Palat elevates symmetrically XI: Shoulder shrug is symmetric. XII: tongue is midline without atrophy or fasciculations.  Motor: Moving all extremities antigravity. No asterixis noted. At times he does have significant shivering-like motion in his legs but this is easily distractible when I placed my hand on his  leg. Sensory: Sensation is symmetric to light touch and temperature in the arms and legs. Deep Tendon Reflexes: 2+ and symmetric in the biceps and patellae.  Plantars: Toes are downgoing bilaterally.  Cerebellar: FNF intact however I could not get him to do heel-to-shin secondary to being distracted.  Labs I have reviewed labs in epic and the results pertinent to this consultation are:   CBC    Component Value Date/Time   WBC 5.0 06/04/2020 0756   RBC 3.85 (L) 06/04/2020 0756   HGB 11.3 (L) 06/04/2020 0756   HGB 14.4 04/13/2018 1057   HCT 35.6 (L) 06/04/2020 0756   HCT 41.6 04/13/2018 1057   PLT 124 (L) 06/04/2020 0756   PLT 131 (L) 04/13/2018 1057   MCV 92.5 06/04/2020 0756   MCV 88 04/13/2018 1057   MCH 29.4 06/04/2020 0756   MCHC 31.7 06/04/2020 0756   RDW 14.2 06/04/2020  0756   RDW 13.8 04/13/2018 1057   LYMPHSABS 1.1 04/24/2020 0541   MONOABS 0.4 04/24/2020 0541   EOSABS 0.0 04/24/2020 0541   BASOSABS 0.0 04/24/2020 0541    CMP     Component Value Date/Time   NA 142 06/04/2020 0756   NA 134 05/02/2019 1711   K 3.6 06/04/2020 0756   CL 105 06/04/2020 0756   CO2 25 06/04/2020 0756   GLUCOSE 83 06/04/2020 0756   BUN 17 06/04/2020 0756   BUN 19 05/02/2019 1711   CREATININE 1.36 (H) 06/04/2020 0756   CREATININE 1.49 (H) 08/27/2016 1412   CALCIUM 9.2 06/04/2020 0756   PROT 7.1 06/04/2020 0756   PROT 6.7 05/02/2019 1711   ALBUMIN 3.9 06/04/2020 0756   ALBUMIN 4.6 05/02/2019 1711   AST 26 06/04/2020 0756   ALT 13 06/04/2020 0756   ALKPHOS 50 06/04/2020 0756   BILITOT 1.0 06/04/2020 0756   BILITOT 0.3 05/02/2019 1711   GFRNONAA 53 (L) 06/04/2020 0756   GFRNONAA 49 (L) 08/27/2016 1412   GFRAA 56 (L) 05/16/2020 1251   GFRAA 56 (L) 08/27/2016 1412    Lipid Panel     Component Value Date/Time   CHOL 100 05/02/2019 1711   TRIG 22 05/02/2019 1711   HDL 54 05/02/2019 1711   CHOLHDL 1.9 05/02/2019 1711   CHOLHDL 2.6 08/21/2016 1059   VLDL 10 08/21/2016  1059   LDLCALC 37 05/02/2019 1711   LDLDIRECT 99 11/11/2011 1030     Imaging I have reviewed the images obtained:  CT-scan of the brain-no acute intracranial findings, chronic microvascular ischemic changes and cerebral volume loss  MRI examination of the brain-no acute cranial abnormality. No acute infarct. Moderate to advanced chronic microvascular ischemic disease and generalized cerebral atrophy.  Etta Quill PA-C [facility] [phone]  M-F  (9:00 am- 5:00 PM)  06/04/2020, 1:44 PM   I have seen the patient and reviewed the above note.  He has subtle tremulous type movements which she is able to suppress in bilateral lower extremities, I suspect voluntary versus enhanced physiological tremor  Assessment:  This is a 69 year old male presenting to the hospital with foot pain and found to be confused. There was also question of aphasia. As noted above on exam he has fluctuating periods to which he is coherently talking and other times easily distracted and unable to hold a conversation. No significant localizing or lateralizing abnormalities on exam.   At this point, the etiology of his confusion remains slightly unclear, possibilities including metabolic encephalopathy, seizure, worsening of underlying cognitive issues.  Impression:  -Worsening confusion of unknown etiology at this point  Recommendations: -Obtain TSH, folate, thiamine, RPR, TSH, B12 -Obtain EEG  Roland Rack, MD [facility] [phone]  If 7pm- 7am, please page neurology on call as listed in [place].

## 2020-06-05 ENCOUNTER — Telehealth (HOSPITAL_COMMUNITY): Payer: Self-pay | Admitting: *Deleted

## 2020-06-05 ENCOUNTER — Ambulatory Visit (HOSPITAL_COMMUNITY): Payer: Medicare Other

## 2020-06-05 DIAGNOSIS — I82551 Chronic embolism and thrombosis of right peroneal vein: Secondary | ICD-10-CM | POA: Diagnosis present

## 2020-06-05 DIAGNOSIS — X58XXXD Exposure to other specified factors, subsequent encounter: Secondary | ICD-10-CM | POA: Diagnosis present

## 2020-06-05 DIAGNOSIS — N400 Enlarged prostate without lower urinary tract symptoms: Secondary | ICD-10-CM | POA: Diagnosis present

## 2020-06-05 DIAGNOSIS — K219 Gastro-esophageal reflux disease without esophagitis: Secondary | ICD-10-CM | POA: Diagnosis present

## 2020-06-05 DIAGNOSIS — Z5900 Homelessness unspecified: Secondary | ICD-10-CM | POA: Diagnosis not present

## 2020-06-05 DIAGNOSIS — I5030 Unspecified diastolic (congestive) heart failure: Secondary | ICD-10-CM | POA: Diagnosis present

## 2020-06-05 DIAGNOSIS — Z888 Allergy status to other drugs, medicaments and biological substances status: Secondary | ICD-10-CM | POA: Diagnosis not present

## 2020-06-05 DIAGNOSIS — E785 Hyperlipidemia, unspecified: Secondary | ICD-10-CM | POA: Diagnosis present

## 2020-06-05 DIAGNOSIS — F17210 Nicotine dependence, cigarettes, uncomplicated: Secondary | ICD-10-CM | POA: Diagnosis present

## 2020-06-05 DIAGNOSIS — R4701 Aphasia: Secondary | ICD-10-CM | POA: Diagnosis present

## 2020-06-05 DIAGNOSIS — Z8249 Family history of ischemic heart disease and other diseases of the circulatory system: Secondary | ICD-10-CM | POA: Diagnosis not present

## 2020-06-05 DIAGNOSIS — Z86010 Personal history of colonic polyps: Secondary | ICD-10-CM | POA: Diagnosis not present

## 2020-06-05 DIAGNOSIS — N1831 Chronic kidney disease, stage 3a: Secondary | ICD-10-CM | POA: Diagnosis present

## 2020-06-05 DIAGNOSIS — G9341 Metabolic encephalopathy: Secondary | ICD-10-CM | POA: Diagnosis present

## 2020-06-05 DIAGNOSIS — I824Z9 Acute embolism and thrombosis of unspecified deep veins of unspecified distal lower extremity: Secondary | ICD-10-CM

## 2020-06-05 DIAGNOSIS — F209 Schizophrenia, unspecified: Secondary | ICD-10-CM | POA: Diagnosis not present

## 2020-06-05 DIAGNOSIS — I10 Essential (primary) hypertension: Secondary | ICD-10-CM | POA: Diagnosis not present

## 2020-06-05 DIAGNOSIS — I4891 Unspecified atrial fibrillation: Secondary | ICD-10-CM | POA: Diagnosis present

## 2020-06-05 DIAGNOSIS — Z9119 Patient's noncompliance with other medical treatment and regimen: Secondary | ICD-10-CM

## 2020-06-05 DIAGNOSIS — Z91199 Patient's noncompliance with other medical treatment and regimen due to unspecified reason: Secondary | ICD-10-CM

## 2020-06-05 DIAGNOSIS — I824Z1 Acute embolism and thrombosis of unspecified deep veins of right distal lower extremity: Secondary | ICD-10-CM | POA: Diagnosis not present

## 2020-06-05 DIAGNOSIS — M7989 Other specified soft tissue disorders: Secondary | ICD-10-CM | POA: Diagnosis present

## 2020-06-05 DIAGNOSIS — D696 Thrombocytopenia, unspecified: Secondary | ICD-10-CM | POA: Diagnosis present

## 2020-06-05 DIAGNOSIS — R488 Other symbolic dysfunctions: Secondary | ICD-10-CM | POA: Diagnosis present

## 2020-06-05 DIAGNOSIS — G9340 Encephalopathy, unspecified: Secondary | ICD-10-CM | POA: Diagnosis present

## 2020-06-05 DIAGNOSIS — E440 Moderate protein-calorie malnutrition: Secondary | ICD-10-CM | POA: Diagnosis not present

## 2020-06-05 DIAGNOSIS — R4182 Altered mental status, unspecified: Secondary | ICD-10-CM | POA: Insufficient documentation

## 2020-06-05 DIAGNOSIS — Z880 Allergy status to penicillin: Secondary | ICD-10-CM | POA: Diagnosis not present

## 2020-06-05 DIAGNOSIS — S93326S Dislocation of tarsometatarsal joint of unspecified foot, sequela: Secondary | ICD-10-CM | POA: Diagnosis not present

## 2020-06-05 DIAGNOSIS — Z20822 Contact with and (suspected) exposure to covid-19: Secondary | ICD-10-CM | POA: Diagnosis present

## 2020-06-05 DIAGNOSIS — F203 Undifferentiated schizophrenia: Secondary | ICD-10-CM | POA: Diagnosis not present

## 2020-06-05 DIAGNOSIS — I130 Hypertensive heart and chronic kidney disease with heart failure and stage 1 through stage 4 chronic kidney disease, or unspecified chronic kidney disease: Secondary | ICD-10-CM | POA: Diagnosis present

## 2020-06-05 DIAGNOSIS — R4189 Other symptoms and signs involving cognitive functions and awareness: Secondary | ICD-10-CM | POA: Diagnosis not present

## 2020-06-05 DIAGNOSIS — F200 Paranoid schizophrenia: Secondary | ICD-10-CM | POA: Diagnosis present

## 2020-06-05 DIAGNOSIS — Z886 Allergy status to analgesic agent status: Secondary | ICD-10-CM | POA: Diagnosis not present

## 2020-06-05 DIAGNOSIS — Z8619 Personal history of other infectious and parasitic diseases: Secondary | ICD-10-CM | POA: Diagnosis not present

## 2020-06-05 DIAGNOSIS — F32A Depression, unspecified: Secondary | ICD-10-CM | POA: Diagnosis present

## 2020-06-05 LAB — COMPREHENSIVE METABOLIC PANEL
ALT: 13 U/L (ref 0–44)
AST: 26 U/L (ref 15–41)
Albumin: 3.8 g/dL (ref 3.5–5.0)
Alkaline Phosphatase: 50 U/L (ref 38–126)
Anion gap: 11 (ref 5–15)
BUN: 13 mg/dL (ref 8–23)
CO2: 26 mmol/L (ref 22–32)
Calcium: 8.8 mg/dL — ABNORMAL LOW (ref 8.9–10.3)
Chloride: 102 mmol/L (ref 98–111)
Creatinine, Ser: 1.27 mg/dL — ABNORMAL HIGH (ref 0.61–1.24)
GFR, Estimated: 58 mL/min — ABNORMAL LOW (ref >60)
Glucose, Bld: 91 mg/dL (ref 70–99)
Potassium: 3.3 mmol/L — ABNORMAL LOW (ref 3.5–5.1)
Sodium: 139 mmol/L (ref 135–145)
Total Bilirubin: 0.8 mg/dL (ref 0.3–1.2)
Total Protein: 6.9 g/dL (ref 6.5–8.1)

## 2020-06-05 LAB — CBC
HCT: 36 % — ABNORMAL LOW (ref 39.0–52.0)
Hemoglobin: 11.7 g/dL — ABNORMAL LOW (ref 13.0–17.0)
MCH: 29.3 pg (ref 26.0–34.0)
MCHC: 32.5 g/dL (ref 30.0–36.0)
MCV: 90.2 fL (ref 80.0–100.0)
Platelets: 130 10*3/uL — ABNORMAL LOW (ref 150–400)
RBC: 3.99 MIL/uL — ABNORMAL LOW (ref 4.22–5.81)
RDW: 13.9 % (ref 11.5–15.5)
WBC: 4.4 10*3/uL (ref 4.0–10.5)
nRBC: 0 % (ref 0.0–0.2)

## 2020-06-05 LAB — RPR: RPR Ser Ql: NONREACTIVE

## 2020-06-05 MED ORDER — MELATONIN 3 MG PO TABS
3.0000 mg | ORAL_TABLET | Freq: Every day | ORAL | Status: DC
  Administered 2020-06-05 – 2020-06-11 (×7): 3 mg via ORAL
  Filled 2020-06-05 (×7): qty 1

## 2020-06-05 MED ORDER — POTASSIUM CHLORIDE 20 MEQ PO PACK
40.0000 meq | PACK | Freq: Once | ORAL | Status: AC
  Administered 2020-06-05: 40 meq via ORAL
  Filled 2020-06-05: qty 2

## 2020-06-05 MED ORDER — FLUPHENAZINE DECANOATE 25 MG/ML IJ SOLN
25.0000 mg | INTRAMUSCULAR | Status: DC
  Filled 2020-06-05 (×2): qty 1

## 2020-06-05 NOTE — Discharge Planning (Signed)
[facility] RN called regarding pt Prolixin dose due tomorrow.  RNCM reviewed chart to find Rx on profile.  Will update RN when given tomorrow.

## 2020-06-05 NOTE — Hospital Course (Addendum)
[name] is a 69 y.o. male presenting with RLE swelling and erythema + aphasia. PMH is significant for recent right foot crush injury s/p ORIF, primary schizophrenia, HFpEF, HTN, HLD, BPH, tobacco abuse, GERD, history of hep C s/p Harvoni and history of hep B cleared.  RLE DVT Patient presented with RLE edema and erythema. Doppler study notable for chronic DVT involving the right peroneal vein compared to doppler study on 04/24/2020 without mention of DVT. Risk factor includes recent ORIF by [name] on 9/14 with a follow up visit on 10/14 who recommended fracture boot with weight bearing as tolerated. Foot radiology demonstrated healing fractures of the second through fifth metatarsal without any interval changes from prior study. Patient placed on Lovenox 65 mg bid for treatment. Vascular surgery recommended long-term anticoagulation of direct oral anticoagulation for 3-6 months dependent on housing barrier. Later placed on rivaroxaban which he was discharged on*** PT/OT recommended SNF given 24 hour supervision. Patient disposition delayed pending safe discharge.   Schizophrenia History of schizophrenia. Presented with mild worsening aphasia compared to baseline echolalia and aphasia. Continued on home meds including benztropine 1 mg bid and prolixin 25 mg q14 days (last given on 10/22, 11/5 and due on 11/19). Psychiatry consulted and followed throughout hospital stay, recommended to continue home therapy. Psych consult was done on 11/6 for possible Geripsych placement. Pt doesn't meet inpatient placement criteria.   Right foot fracture Patient has a history of Lisfranc fracture, s/p ORIF on 04/30/2020 by [name]. Seen for follow up visit on 10/14 who recommended to continue to wear fracture boot with weight bearing as tolerated and follow up with repeat imaging in 4 weeks. On admission, foot x-ray demonstrated healing fractures of the second through fifth metatarsals. No interval change noted from  previous study. Patient continually wore fracture boot throughout hospitalization as recommended. F/U foot X ray on 06/27/20.***  All other issues chronic and stable.    Recommendations at discharge  Recs for 3-6 months of DOAC. Follow up  Needs orthopedic follow up around 11/11

## 2020-06-05 NOTE — Discharge Summary (Addendum)
[facility] Discharge Summary  Patient name: [name] Medical record number: [id number] Date of birth: 1951/08/13 Age: 69 y.o. Gender: male Date of Admission: 06/04/2020  Date of Discharge: 06/26/2020 Admitting Physician: Corey Pike, MD  Primary Care Provider: Danna Hefty, DO Consultants: Neurology, psychiatry, vascular surgery   Indication for Hospitalization: RLE edema   Discharge Diagnoses/Problem List:  RLE DVT Schizophrenia Right foot fracture Thrombocytopenia CKD stage 3 HTN Normocytic anemia  Disposition: [place].  Discharge Condition: medically stable   Discharge Exam: BP- 112/67 mm HG, PR- 68/min Temp- 98.7 F  General:He has mumbling difficult to understand speech. He is in no acute distress.  Cardiovascular:RRR,no murmurs appreciated. Respiratory:Breath sounds clear, No wheezing, rales, rhonchi. Abdomen:No abdominal swelling, No abdominal pain. Extremities:No lower extremityedema or redness.[name] examine Rt foot because of boot.  Brief Hospital Course:  [name] is a 69 y.o. male presenting with RLE swelling and erythema + aphasia. PMH is significant for recent right foot crush injury s/p ORIF, primary schizophrenia, HFpEF, HTN, HLD, BPH, tobacco abuse, GERD, history of hep C s/p Harvoni and history of hep B cleared.  RLE DVT Patient presented with RLE edema and erythema. Doppler study notable for chronic DVT involving the right peroneal vein compared to doppler study on 04/24/2020. DVT likely provoked in setting of recent left Lisfranc fracture s/p ORIF 04/30/20 (see below). Patient placed on Lovenox 65 mg bid for treatment and then switched to rivaroxaban. He was discharged on 20mg  BID Rivaroxaban. [facility] is [facility].  Schizophrenia History of schizophrenia. Presented with mild worsening aphasia compared to baseline echolalia and aphasia. Continued on home meds  including benztropine 1 mg bid and prolixin 25 mg q14 days (last Lowe on 11/5). Pt next Prolixin injection is due on 07/05/20. Psychiatry initially consulted and recommended to continue home therapy. Psychiatry consulted again for wandering issues but he doesn't meet the criteria for inpatient hospitalization in Geriatric Psychiatry unit. He was started on trazadone 50 mg QHS & ramelteon 8 mg QHS which helped with wandering. Patient also started on hydroxyzine 25 mg PRN anxiety/agitation.  He has a F/U appointment at [facility] on 11/19 for Next Prolixin injection.  Right foot fracture Patient has a history of Lisfranc fracture, s/p ORIF on 04/30/2020 by [name]. Seen for follow up visit on 10/14 who recommended to continue to wear fracture boot with weight bearing as tolerated and follow up with repeat imaging in 4 weeks. On admission, foot x-ray demonstrated healing fractures of the second through fifth metatarsals. No interval change noted from previous study. Patient continually wore fracture boot throughout hospitalization as recommended. [name] recommended repeat studies on 06/27/20 at follow up appointment which were obtained prior to discharge on 06/26/20 as it was unclear whether patient would be able to make it to his follow up appt on 06/27/20. Repeat X ray on 06/26/20 of Rt leg showed Areas of postoperative fixation involving the first and second tarsal-metatarsal joints as well as screw fixation through a fracture of the proximal fifth metatarsal with alignment anatomic in these areas.   Issues for Follow Up:  1. Patient started on direct anticoagulation per vascular recommendation. Recommended to remain on it for 3-6 months. Patient will require supervision of medication administration he needs on a daily basis to ensure proper compliance and treatment.  2. Pt has a ACT appointment for evaluation on 07/02/20. Transportation will be arranged from [employer] to ACT appointment.  3. Pt's appointment  with [name] was rescheduled for 07/04/20 @2 :45PM.   Significant Procedures:  None   Significant Labs and Imaging:  No results for input(s): WBC, HGB, HCT, PLT in the last 168 hours. No results for input(s): NA, K, CL, CO2, GLUCOSE, BUN, CREATININE, CALCIUM, MG, PHOS, ALKPHOS, AST, ALT, ALBUMIN, PROTEIN in the last 168 hours.  Invalid input(s): TBILI  Results/Tests Pending at Time of Discharge: none  Discharge Medications:  Allergies as of 06/26/2020      Reactions   Lisinopril Other (See Comments)   Cough   Penicillins Hives   Amoxicillin Rash   Ibuprofen Rash      Medication List    TAKE these medications   acetaminophen 325 MG tablet Commonly known as: TYLENOL Take 2 tablets (650 mg total) by mouth every 6 (six) hours as needed for mild pain, moderate pain or headache.   amLODipine 10 MG tablet Commonly known as: NORVASC Take 10 mg by mouth daily.   benztropine 1 MG tablet Commonly known as: COGENTIN Take 1 tablet (1 mg total) by mouth 2 (two) times daily.   cetirizine 10 MG tablet Commonly known as: ZyrTEC Allergy Take 1 tablet (10 mg total) by mouth daily.   famotidine 20 MG tablet Commonly known as: PEPCID Take 20 mg by mouth 2 (two) times daily.   fluPHENAZine decanoate 25 MG/ML injection Commonly known as: PROLIXIN Inject 1 mL (25 mg total) into the muscle every 14 (fourteen) days. Last dose 09/07/12 What changed: additional instructions   fluticasone 50 MCG/ACT nasal spray Commonly known as: FLONASE Place 2 sprays into both nostrils daily.   folic acid 1 MG tablet Commonly known as: FOLVITE Take 1 tablet (1 mg total) by mouth daily.   hydrOXYzine 25 MG tablet Commonly known as: ATARAX/VISTARIL Take 1 tablet (25 mg total) by mouth at bedtime as needed for anxiety (Anxiety, Insomnia).   multivitamin with minerals Tabs tablet Take 1 tablet by mouth daily.   polyethylene glycol 17 g packet Commonly known as: MIRALAX / GLYCOLAX Take 17 g by  mouth 2 (two) times daily.   ramelteon 8 MG tablet Commonly known as: ROZEREM Take 1 tablet (8 mg total) by mouth at bedtime.   rivaroxaban 20 MG Tabs tablet Commonly known as: XARELTO Take 1 tablet (20 mg total) by mouth daily with supper.   tamsulosin 0.4 MG Caps capsule Commonly known as: FLOMAX TAKE 2 CAPSULE BY MOUTH DAILY What changed: See the new instructions.   thiamine 100 MG tablet Take 1 tablet (100 mg total) by mouth daily.   traZODone 50 MG tablet Commonly known as: DESYREL Take 1 tablet (50 mg total) by mouth at bedtime.   vitamin C 500 MG tablet Commonly known as: ASCORBIC ACID Take 500 mg by mouth daily.            Discharge Care Instructions  (From admission, onward)         Start     Ordered   06/26/20 0000  Discharge wound care:       Comments: Follow up with orthopedics on 11/18.   06/26/20 1510          Discharge Instructions: Please refer to Patient Instructions section of EMR for full details.  Patient was counseled important signs and symptoms that should prompt return to medical care, changes in medications, dietary instructions, activity restrictions, and follow up appointments.   Follow-Up Appointments:  Follow-up Information    [facility]. Go on  07/02/2020.   Why: You have an assessment for an ACT team with [facility]. Your appointment will be in person at [facility] office on Tuesday 07/02/20 at [place] can contact [place] from [facility] directly at [phone] Contact information: [place] [address] [phone]        [facility]. Go on 07/01/2020.   Specialty: Urgent Care Why: You have an appointment for your Prolixin injection on 07/01/20 at 1130am.   Contact information: [place]              Wilber Oliphant, MD 06/26/2020, 3:19 PM PGY-3, [place]

## 2020-06-05 NOTE — Progress Notes (Signed)
06/05/20 1058  PT Visit Information  Last PT Received On 06/05/20  Assistance Needed +2  History of Present Illness Pt is a 69 y/o male admitted secondary to RLE swelling. Found to have RLE DVT and was started on lovenox. Pt with recent R foot fx and is s/p ORIF. PMH includes schizophrenia, HTN, tobacco use, and hep C.   Precautions  Precautions Fall  Required Braces or Orthoses Other Brace  Other Brace CAM boot RLE  Restrictions  Weight Bearing Restrictions Yes  RLE Weight Bearing WBAT  Other Position/Activity Restrictions in cam walker boot  Home Living  Family/patient expects to be discharged to: Shelter/Homeless  Prior Function  Comments Unsure as pt unable to report.   Communication  Communication Expressive difficulties  Pain Assessment  Pain Assessment Faces  Faces Pain Scale 2  Pain Location R foot  Pain Descriptors / Indicators Grimacing;Guarding  Pain Intervention(s) Monitored during session;Limited activity within patient's tolerance;Repositioned  Cognition  Arousal/Alertness Awake/alert  Behavior During Therapy WFL for tasks assessed/performed  Overall Cognitive Status History of cognitive impairments - at baseline  General Comments Very pleasant throughout; difficult to understand and times. Difficulty sequencing using RW. Psych hx at baseline.   Upper Extremity Assessment  Upper Extremity Assessment Defer to OT evaluation  Lower Extremity Assessment  Lower Extremity Assessment RLE deficits/detail  RLE Deficits / Details RLE in cam boot throughout secondary to recent surgery  Bed Mobility  Overal bed mobility Needs Assistance  Bed Mobility Supine to Sit;Sit to Supine  Supine to sit Min assist  Sit to supine Min assist;Mod assist;+2 for safety/equipment  General bed mobility comments Min A for trunk elevation to come to sitting. To return to supine, Pt climbing into bed in quadriped position and remained prone until cued otherwise. required min to mod A to  safely return to supine.   Transfers  Overall transfer level Needs assistance  Equipment used Rolling walker (2 wheeled);2 person hand held assist  Transfers Sit to/from Stand  Sit to Stand Min assist;Mod assist;+2 safety/equipment;+2 physical assistance  General transfer comment Initially using B HHA, however, progressed to using walker. Min to mod A for stability.   Ambulation/Gait  Ambulation/Gait assistance Min assist;+2 physical assistance  Gait Distance (Feet) 50 Feet  Assistive device Rolling walker (2 wheeled)  Gait Pattern/deviations Step-through pattern;Decreased stride length  General Gait Details Ambulated to bathroom and back. Required max multimodal cues for sequencing using RW. Left RW behind multiple times. 1 LOB noted when not using RW.   Gait velocity Decreased  Balance  Overall balance assessment Needs assistance  Sitting-balance support Feet supported;No upper extremity supported  Sitting balance-Leahy Scale Good  Standing balance support Bilateral upper extremity supported;During functional activity  Standing balance-Leahy Scale Poor  Standing balance comment REliant on BUE support   PT - End of Session  Equipment Utilized During Treatment Gait belt  Activity Tolerance Patient tolerated treatment well  Patient left in bed;with call bell/phone within reach (on stretcher in ED )  Nurse Communication Mobility status  PT Assessment  PT Recommendation/Assessment Patient needs continued PT services  PT Visit Diagnosis Unsteadiness on feet (R26.81);Muscle weakness (generalized) (M62.81);Other symptoms and signs involving the nervous system (R29.898)  PT Problem List Decreased strength;Decreased range of motion;Decreased activity tolerance;Decreased balance;Decreased mobility;Decreased cognition;Decreased knowledge of use of DME;Decreased safety awareness;Decreased knowledge of precautions;Pain  PT Plan  PT Frequency (ACUTE ONLY) Min 2X/week  PT Treatment/Interventions  (ACUTE ONLY) DME instruction;Gait training;Functional mobility training;Therapeutic activities;Therapeutic exercise;Balance training;Cognitive remediation;Patient/family education  AM-PAC PT "6 Clicks" Mobility Outcome Measure (Version 2)  Help needed turning from your back to your side while in a flat bed without using bedrails? 3  Help needed moving from lying on your back to sitting on the side of a flat bed without using bedrails? 3  Help needed moving to and from a bed to a chair (including a wheelchair)? 3  Help needed standing up from a chair using your arms (e.g., wheelchair or bedside chair)? 3  Help needed to walk in hospital room? 3  Help needed climbing 3-5 steps with a railing?  1  6 Click Score 16  Consider Recommendation of Discharge To: Home with [facility]  PT Recommendation  Follow Up Recommendations SNF;Supervision/Assistance - 24 hour  PT equipment None recommended by PT  Individuals Consulted  Consulted and Agree with Results and Recommendations Patient unable/family or caregiver not available  Acute Rehab PT Goals  PT Goal Formulation Patient unable to participate in goal setting  Time For Goal Achievement 06/19/20  Potential to Achieve Goals Fair  PT Time Calculation  PT Start Time (ACUTE ONLY) 1055  PT Stop Time (ACUTE ONLY) 1118  PT Time Calculation (min) (ACUTE ONLY) 23 min  PT General Charges  $$ ACUTE PT VISIT 1 Visit  PT Evaluation  $PT Eval Moderate Complexity 1 Mod   Pt admitted secondary to problem above with deficits below. Pt requiring min to mod A +2 throughout mobility tasks using RW. Pt with difficulty sequencing and unsteadiness throughout. Feel pt will require SNF level therapies to increase independence and safety with mobility. Will continue to follow acutely.   Reuel Derby, PT, DPT  Acute Rehabilitation Services  Pager: [phone] Office: [phone]

## 2020-06-05 NOTE — Progress Notes (Addendum)
Family Medicine Teaching Service Daily Progress Note Intern Pager: [phone]  Patient name: [name] Medical record number: [id number] Date of birth: [birth year] Age: 69 y.o. Gender: male  Primary Care Provider: Danna Hefty, DO Consultants: Neurology, vascular surgery Code Status: Full  Pt Overview and Major Events to Date:  Admitted 10/19  Assessment and Plan: [name] is a 69 y.o. male presenting with RLE swelling and erythema + aphasia. PMH is significant for recent right foot crush injury s/p ORIF, primary schizophrenia, HFpEF,HTN, HLD, BPH, tobacco abuse, GERD, history of hep C s/p Harvoni and history of hep B cleared.  Schizophrenia Presented with aphasia and RLE pain. At baseline, patient has some level of aphasia that has worsened leading to his admission. Last known normal is unknown. Has history of schizophrenia with echolalia and mumbling speech at baseline.Patient is resident of [place] but has history of wandering off; last seen at facility on 10/15. Unclear whether patient is homeless but has had issues with this in the past. On exam, patient AOx2 with resting tremor. Work up thus far unremarkable for cause including normal ammonia, ethanol level, CBC, CMP, UA and UDS. Both CT head and MRI demonstrated no acute intracranial findings with chronic microvascular ischemic change and cerebral volume loss. CBG 68 on admission. Home meds: Vistaril 50 mgQHS (not on med rec), Fluphenazine 25 mg injectionq14days(last dose supposedly 10/8, next dose due 10/21) andbenztropine1 mg BID. Last meds reportedly taken on 10/15. Differential for patient's altered speech includes antipsychotic withdrawal. Stroke ruled out. TSH and folate wnl. B12 elevated at 980. Concern for encephalopathy given that EEG demonstrated mild diffuse encephalopathy of nonspecific etiology without any seizure activity.  -Neurology followings, no further neuro workup at this time but recommends to optimize  psych medications. -consider psych consult  -benzotropine 1 mg bid -consider fluphenazine 25 mg injectiontomorrow as patient is due  - up with assistance - PT/OT recs -continue to monitor   RLE Swelling + Erythema c/f DVT Patient presenting with acute change in RLE swelling and erythema that was not noted previously, imaging prior to admission on 04/24/2020 demonstrated no evidence of DVT. [place] LE on 10/18 notable for findings consistent with chronic DVT involving the right peroneal veins without a cystic structure found in the popliteal fossa. No evidence of DVT in the lower extremity. Risk factors for DVT include recent surgery. On exam, calf asymmetry notable for right>left. Denies any calf tenderness.  -appreciate VVS recs and involvement  - Lovenox 65 mg bid for DVT treatment  - will further discuss with team regarding long term anticoagulation therapy - PT/OT recs   Thrombocytopenia Chronic and improving. Platelets 130 this morning. History of low platelet count with baseline around 100-200. Platelets of 124 on admission.   - continue to monitor  Hypokalemia K 3.3. Given KCl 40 mEq of repletion. -monitor K with BMP, repletion as needed   CKD IIIa Improving. Cr 1.27 this am. Cr 1.36 on admission, baseline 1.3-1.4.  - continue to monitor  HTN Mildly hypertensive this morning with BP 143/86. Elevated BP on admission, with max 190/95. Home meds: Amlodipine 10mg  QD. History of taking Losartan in past. Likely elevated BP secondary to medication noncompliance given last reported taking on 10/15. - home Amlodipine - continue to monitor  Normocytic Anemia Chronic and stable. Hgb 11.7 this morning. Hgb 11.3 on admission, baseline 10-11. No signs of acute bleed. No known source of bleeding. Likely a contributing factor being history of CKD.  - continue to  monitor  - recommend outpatient colonoscopy and anemia workup - continue folic acid 1 mg daily   Right foot fracture s/p ORIF  (9/14)  H/o Lisfranc fracture s/p ORIF by [name] on 9/14. Saw [name] for follow up visit on 10/14 who recommended to continue with fracture boot with weight bearing as tolerated and follow up with repeat imaging in 4 weeks. Foot x-ray on admission notable for healing fractures of the second through fifth metatarsal. No interval change from the prior study. - continue fracture boot - PT/OT  GERD Home med famotidine 20 mg bid. -continue home famotidine   BPH Chronic and stable. Medications include Flomax0.8 mg daily. -Continue home Flomax  Nicotine dependence Patient has history of nicotine dependence, on previous admission was on nicotine patch consistently. -nicotine patch  -smoking cessation education  Social needs Patient homeless, he left his shelter about a month ago and this is likely affecting his medication compliance. Patient previously [facility] at [facility] where he is known to wander off, was last seen at the facility on 10/15. -SW consult, appreciate involvement and assistance    FEN/GI: liquid diet  PPx: Lovenox    Status is: Observation   Disposition: inpatient    Subjective:  Patient denies any complaints. Keep stating that he wants his wife to come see hime here in the hospital.   Objective: Temp:  [98 F (36.7 C)] 98 F (36.7 C) (10/19 0848) Pulse Rate:  [37-91] 73 (10/20 0600) Resp:  [11-20] 16 (10/20 0600) BP: (124-190)/(68-105) 143/86 (10/20 0600) SpO2:  [91 %-100 %] 91 % (10/20 0600) Physical Exam: General: Patient laying comfortably in bed, in no acute distress. Cardiovascular: RRR, no murmurs or gallops auscultated Respiratory: lungs clear to auscultation bilaterally, breathing comfortably on room air Abdomen: soft, nontender, presence of active bowel sounds Extremities: radial and distal pulses intact bilaterally, no LE edema noted bilaterally, calf asymmetry RLE>LLE, no erythema  Derm: skin warm and dry to touch, cut on tip of nose, no  rashes noted  Neuro: AOx2, resting tremor noted in the right hand, patient refused to follow commands for more thorough neuro exam Psych: limited by patient's capacity   Exam limited by patient's current mental capacity.   Laboratory: Recent Labs  Lab 06/04/20 0756 06/05/20 0236  WBC 5.0 4.4  HGB 11.3* 11.7*  HCT 35.6* 36.0*  PLT 124* 130*   Recent Labs  Lab 06/04/20 0756 06/05/20 0236  NA 142 139  K 3.6 3.3*  CL 105 102  CO2 25 26  BUN 17 13  CREATININE 1.36* 1.27*  CALCIUM 9.2 8.8*  PROT 7.1 6.9  BILITOT 1.0 0.8  ALKPHOS 50 50  ALT 13 13  AST 26 26  GLUCOSE 83 91      Imaging/Diagnostic Tests: EEG  Result Date: 06/04/2020 Lora Havens, MD     06/04/2020  5:06 PM Patient Name: [name] MRN: [id number] Epilepsy Attending: Lora Havens Referring Physician/Provider: Etta Quill, PA Date: 06/04/2020 Duration: 24.24 mins Patient history: 69yo M with ams. EEG to valuate for seizure. Level of alertness: Awake, asleep AEDs during EEG study: None Technical aspects: This EEG study was done with scalp electrodes positioned according to the 10-20 International system of electrode placement. Electrical activity was acquired at a sampling rate of 500Hz  and reviewed with a high frequency filter of 70Hz  and a low frequency filter of 1Hz . EEG data were recorded continuously and digitally stored. Description: The posterior dominant rhythm consists of 9 Hz activity  of moderate voltage (25-35 uV) seen predominantly in posterior head regions, symmetric and reactive to eye opening and eye closing.  Sleep was characterized by vertex waves, sleep spindles (12 to 14 Hz), maximal frontocentral region. EEG showed continuous generalized 3-6hz  theta-delta slowing.  Hyperventilation and photic stimulation were not performed.   ABNORMALITY -Continuous slow, generalized IMPRESSION: This study is suggestive of mild diffuse encephalopathy, nonspecific etiology. No seizures or epileptiform  discharges were seen throughout the recording. Lora Havens   CT Head Wo Contrast  Result Date: 06/04/2020 CLINICAL DATA:  Altered mental status EXAM: CT HEAD WITHOUT CONTRAST TECHNIQUE: Contiguous axial images were obtained from the base of the skull through the vertex without intravenous contrast. COMPARISON:  05/12/2020 FINDINGS: Brain: No evidence of acute infarction, hemorrhage, hydrocephalus, extra-axial collection or mass lesion/mass effect. Moderate low-density changes within the periventricular and subcortical white matter compatible with chronic microvascular ischemic change. Mild diffuse cerebral volume loss. Vascular: Atherosclerotic calcifications involving the large vessels of the skull base. No unexpected hyperdense vessel. Skull: Normal. Negative for fracture or focal lesion. Sinuses/Orbits: No acute finding. Other: None. IMPRESSION: 1. No acute intracranial findings. 2. Chronic microvascular ischemic change and cerebral volume loss. Electronically Signed   By: Davina Poke D.O.   On: 06/04/2020 08:46   MR BRAIN WO CONTRAST  Result Date: 06/04/2020 CLINICAL DATA:  Neuro deficit, acute stroke suspected. EXAM: MRI HEAD WITHOUT CONTRAST TECHNIQUE: Multiplanar, multiecho pulse sequences of the brain and surrounding structures were obtained without intravenous contrast. COMPARISON:  CT head 05/12/2020 FINDINGS: Brain: No acute hemorrhage, hydrocephalus, extra-axial collection or mass lesion. There is field in homogeneity affecting the diffusion sequence without convincing focal restricted diffusion to suggest acute infarct. Moderate to advanced confluency periventricular and subcortical T2/FLAIR hyperintensities, compatible with chronic microvascular ischemic disease. Moderate to advanced diffuse cerebral atrophy with ex vacuo ventricular dilation. Vascular: Major proximal arterial flow voids are maintained at the skull base. Skull and upper cervical spine: Normal marrow signal.  Sinuses/Orbits: Mild scattered paranasal sinus mucosal thickening without air-fluid levels. Unremarkable orbits. Other: No mastoid effusions. IMPRESSION: 1. No acute intracranial abnormality. Specifically, no acute infarct. 2. Moderate to advanced chronic microvascular ischemic disease and generalized cerebral atrophy. Electronically Signed   By: Margaretha Sheffield MD   On: 06/04/2020 12:12   DG Foot 2 Views Right  Result Date: 06/04/2020 CLINICAL DATA:  Recent foot fracture with surgery.  Foot swelling. EXAM: RIGHT FOOT - 2 VIEW COMPARISON:  05/30/2020 FINDINGS: Healing fractures of the second, third, fourth, and fifth metatarsals unchanged. There is a screw across the fifth metatarsal fracture. There is a screw across the first tarsal metatarsal joint. There is a screw across the medial cuneiform into the second metatarsal unchanged. No new fracture.  No change from the recent study. IMPRESSION: Healing fractures of the second through fifth metatarsal. No interval change from the prior study. Electronically Signed   By: Franchot Gallo M.D.   On: 06/04/2020 08:21   VAS [place] LOWER EXTREMITY VENOUS (DVT) (MC and WL 7a-7p)  Result Date: 06/04/2020  Lower Venous DVT Study Indications: Edema.  Risk Factors: None identified. Limitations: Poor ultrasound/tissue interface and patient positioning, poor patient cooperation, patient movement. Comparison Study: No prior studies. Performing Technologist: Oliver Hum RVT  Examination Guidelines: A complete evaluation includes B-mode imaging, spectral Doppler, color Doppler, and power Doppler as needed of all accessible portions of each vessel. Bilateral testing is considered an integral part of a complete examination. Limited examinations for reoccurring indications may be performed as  noted. The reflux portion of the exam is performed with the patient in reverse Trendelenburg.  +---------+---------------+---------+-----------+----------+--------------+ RIGHT     CompressibilityPhasicitySpontaneityPropertiesThrombus Aging +---------+---------------+---------+-----------+----------+--------------+ CFV      Full           Yes      Yes                                 +---------+---------------+---------+-----------+----------+--------------+ SFJ      Full                                                        +---------+---------------+---------+-----------+----------+--------------+ FV Prox  Full                                                        +---------+---------------+---------+-----------+----------+--------------+ FV Mid   Full                                                        +---------+---------------+---------+-----------+----------+--------------+ FV DistalFull                                                        +---------+---------------+---------+-----------+----------+--------------+ PFV      Full                                                        +---------+---------------+---------+-----------+----------+--------------+ POP      Full           Yes      Yes                                 +---------+---------------+---------+-----------+----------+--------------+ PTV      Full                                                        +---------+---------------+---------+-----------+----------+--------------+ PERO     Partial                                      Chronic        +---------+---------------+---------+-----------+----------+--------------+   +---------+---------------+---------+-----------+----------+--------------+ LEFT     CompressibilityPhasicitySpontaneityPropertiesThrombus Aging +---------+---------------+---------+-----------+----------+--------------+ CFV      Full           Yes      Yes                                 +---------+---------------+---------+-----------+----------+--------------+  SFJ      Full                                                         +---------+---------------+---------+-----------+----------+--------------+ FV Prox  Full                                                        +---------+---------------+---------+-----------+----------+--------------+ FV Mid   Full                                                        +---------+---------------+---------+-----------+----------+--------------+ FV DistalFull                                                        +---------+---------------+---------+-----------+----------+--------------+ PFV      Full                                                        +---------+---------------+---------+-----------+----------+--------------+ POP      Full           Yes      Yes                                 +---------+---------------+---------+-----------+----------+--------------+ PTV      Full                                                        +---------+---------------+---------+-----------+----------+--------------+ PERO     Full                                                        +---------+---------------+---------+-----------+----------+--------------+     Summary: RIGHT: - Findings consistent with chronic deep vein thrombosis involving the right peroneal veins. - No cystic structure found in the popliteal fossa.  LEFT: - There is no evidence of deep vein thrombosis in the lower extremity. However, portions of this examination were limited- see technologist comments above.  *See table(s) above for measurements and observations. Electronically signed by Curt Jews MD on 06/04/2020 at 4:28:45 PM.    Final     [name], DO 06/05/2020, 6:36 AM PGY-1, [place] Intern pager: [phone], text pages welcome

## 2020-06-05 NOTE — Progress Notes (Signed)
FPTS Interim Progress Note  Spoke with Dr. Trula Slade, vascular surgery, over the phone. Regarding patient's situation, recommended patient be on doac full dose for 3-6 months of prophylactic anticoagulation given that this was a provoked DVT from recent orthopedic surgery. Patient does not need to follow up with vascular surgery outpatient. Appreciate Dr. Stephens Shire recommendations and assistance.  [name], DO 06/05/2020, 3:39 PM PGY-1, [place] Medicine Service pager [phone]

## 2020-06-05 NOTE — ED Notes (Signed)
Pt standing in room, peeing on floor.  Assisted back to bed.

## 2020-06-05 NOTE — Consult Note (Signed)
[facility] Face-to-Face Psychiatry Consult   Reason for Consult:  Schizophrenia Referring Physician: Dr. Larae Grooms Patient Identification: JALIN ALICEA MRN:  [id number] Principal Diagnosis: <principal problem not specified> Diagnosis:  Active Problems:   DVT (deep venous thrombosis) ([place])   Total Time spent with patient: 45 minutes  Subjective:   TIMMIE DUGUE is a 69 y.o. male patient admitted with RLE swelling, erythema, and some notable aphasia. PMH significant for schizophrenia. He is currently receiving services through [facility] last office visit was 05/23/2020. ACT referral has been placed due to his frequent hospitalizations, some medication adherence complications, and shelter issues. Patient was previously residing at [facility], however has not seen for the past week. He is unable to identify where he has been during this time frame. Per chart documentation he has been compliant on his medication and receiving bi-monthly injections of  Long acting Prolixin for his schizophrenia. He denies any current psychosis, hallucinations, or paranoia. At this time he denies any suicidal ideations, homicidal ideations, and or self harm behaviors.   HPI:  GEOFFRY BANNISTER is a 69 y.o. male presenting with RLE swelling and erythema + aphasia. PMH is significant for recent right foot crush injury s/p ORIF, primary schizophrenia, HFpEF,HTN, HLD, BPH, tobacco abuse, GERD, history of hep C s/p [place] and history of hep B cleared.  Schizophrenia Presented with aphasia and RLE pain. At baseline, patient has some level of aphasia that has worsened leading to his admission. Last known normal is unknown. Has history of schizophrenia with echolalia and mumbling speech at baseline.Patient is resident of [place] but has history of wandering off; last seen at facility on 10/15. Unclear whether patient is homeless but has had issues with this in the past. On exam, patient AOx2 with resting  tremor. Work up thus far unremarkable for cause including normal ammonia, ethanol level, CBC, CMP, UA and UDS. Both CT head and MRI demonstrated no acute intracranial findings with chronic microvascular ischemic change and cerebral volume loss. CBG 68 on admission. Home meds: Vistaril 50 mgQHS (not on med rec), Fluphenazine 25 mg injectionq14days(last dose supposedly 10/8, next dose due 10/21) andbenztropine1 mg BID. Last meds reportedly taken on 10/15. Differential for patient's altered speech includes antipsychotic withdrawal. Stroke ruled out. TSH and folate wnl. B12 elevated at 980. Concern for encephalopathy given that EEG demonstrated mild diffuse encephalopathy of nonspecific etiology without any seizure activity.   During the evaluation patient is alert and oriented x2.  He is observed to be sitting upright at his bedside table eating lunch.  He does ask this Probation officer for assistance opening his pudding, he inquires about his diet.  It appears that he is currently on a dysphagia diet likely due to his tremors, current diagnosis of encephalopathy, and aphasia.  He thinks Probation officer for my assistance with opening his food.  He appears to have a good appetite, noting that all of his food has been consumed. There currently ruling him out for stroke.  He verbalizes understanding, reports his wife is coming today.  He expresses excitement to see her.  He denies any symptoms of depression, sadness, withdrawal, isolation.  He denies being missing however is unable to afford any additional details about his missing appearance at [facility].  He does endorse compliance with his medication.  He denies any suicidal thoughts.  His schizophrenia appears to be stable at this time, no additional adjustments or medication changes will take place.  Past Psychiatric History: Schizophrenia  Risk  to Self:  No Risk to Others:   NO Prior Inpatient Therapy:  Unable to recall Prior Outpatient Therapy:   [facility], last visit 05/23/2020  Past Medical History:  Past Medical History:  Diagnosis Date  . BPH (benign prostatic hyperplasia)   . Colon polyp 2010  . Depression   . GERD (gastroesophageal reflux disease)   . Hepatitis C    "caught it when I had a blood transfusion"  . High cholesterol   . History of blood transfusion    "when I was young"  . Hypertension   . Paranoid schizophrenia ([place])   . Prostate atrophy   . Retinal vein occlusion     Past Surgical History:  Procedure Laterality Date  . CIRCUMCISION  1959  . ORIF TOE FRACTURE Right 04/30/2020   Procedure: OPEN REDUCTION INTERNAL FIXATION (ORIF) RIGHT FOOT METATARSAL FRACTURES;  Surgeon: Newt Minion, MD;  Location: [facility];  Service: Orthopedics;  Laterality: Right;  . TONSILLECTOMY     Family History:  Family History  Problem Relation Age of Onset  . Hypertension Mother   . Diabetes Mother   . Stroke Mother    Family Psychiatric  History: Denies Social History:  Social History   Substance and Sexual Activity  Alcohol Use Yes     Social History   Substance and Sexual Activity  Drug Use No    Social History   Socioeconomic History  . Marital status: Married    Spouse name: Not on file  . Number of children: Not on file  . Years of education: Not on file  . Highest education level: Not on file  Occupational History  . Not on file  Tobacco Use  . Smoking status: Current Every Day Smoker    Packs/day: 0.50    Years: 48.00    Pack years: 24.00    Types: Cigarettes  . Smokeless tobacco: Never Used  Substance and Sexual Activity  . Alcohol use: Yes  . Drug use: No  . Sexual activity: Yes  Other Topics Concern  . Not on file  Social History Narrative  . Not on file   Social Determinants of Health   Financial Resource Strain:   . Difficulty of Paying Living Expenses: Not on file  Food Insecurity:   . Worried About Charity fundraiser in the Last Year: Not on file  . Ran Out of Food  in the Last Year: Not on file  Transportation Needs:   . Lack of Transportation (Medical): Not on file  . Lack of Transportation (Non-Medical): Not on file  Physical Activity:   . Days of Exercise per Week: Not on file  . Minutes of Exercise per Session: Not on file  Stress:   . Feeling of Stress : Not on file  Social Connections:   . Frequency of Communication with Friends and Family: Not on file  . Frequency of Social Gatherings with Friends and Family: Not on file  . Attends Religious Services: Not on file  . Active Member of Clubs or Organizations: Not on file  . Attends Archivist Meetings: Not on file  . Marital Status: Not on file   Additional Social History:    Allergies:   Allergies  Allergen Reactions  . Lisinopril Other (See Comments)    Cough  . Penicillins Hives  . Amoxicillin Rash  . Ibuprofen Rash    Labs:  Results for orders placed or performed during the hospital encounter of  06/04/20 (from the past 48 hour(s))  CBC     Status: Abnormal   Collection Time: 06/04/20  7:56 AM  Result Value Ref Range   WBC 5.0 4.0 - 10.5 K/uL   RBC 3.85 (L) 4.22 - 5.81 MIL/uL   Hemoglobin 11.3 (L) 13.0 - 17.0 g/dL   HCT 35.6 (L) 39 - 52 %   MCV 92.5 80.0 - 100.0 fL   MCH 29.4 26.0 - 34.0 pg   MCHC 31.7 30.0 - 36.0 g/dL   RDW 14.2 11.5 - 15.5 %   Platelets 124 (L) 150 - 400 K/uL    Comment: REPEATED TO VERIFY   nRBC 0.0 0.0 - 0.2 %    Comment: Performed at [facility], [place] [address], [place], [place] [address]  Ethanol     Status: None   Collection Time: 06/04/20  7:56 AM  Result Value Ref Range   Alcohol, Ethyl (B) <10 <10 mg/dL    Comment: (NOTE) Lowest detectable limit for serum alcohol is 10 mg/dL.  For medical purposes only. Performed at [facility], [place] [address], [place], [place] [address]   Comprehensive metabolic panel     Status: Abnormal   Collection Time: 06/04/20  7:56 AM  Result Value Ref Range   Sodium 142 135 - 145  mmol/L   Potassium 3.6 3.5 - 5.1 mmol/L   Chloride 105 98 - 111 mmol/L   CO2 25 22 - 32 mmol/L   Glucose, Bld 83 70 - 99 mg/dL    Comment: Glucose reference range applies only to samples taken after fasting for at least 8 hours.   BUN 17 8 - 23 mg/dL   Creatinine, Ser 1.36 (H) 0.61 - 1.24 mg/dL   Calcium 9.2 8.9 - 10.3 mg/dL   Total Protein 7.1 6.5 - 8.1 g/dL   Albumin 3.9 3.5 - 5.0 g/dL   AST 26 15 - 41 U/L   ALT 13 0 - 44 U/L   Alkaline Phosphatase 50 38 - 126 U/L   Total Bilirubin 1.0 0.3 - 1.2 mg/dL   GFR, Estimated 53 (L) >60 mL/min   Anion gap 12 5 - 15    Comment: Performed at [facility] [address] [place], [place] [address]  Ammonia     Status: None   Collection Time: 06/04/20  7:56 AM  Result Value Ref Range   Ammonia 21 9 - 35 umol/L    Comment: Performed at [facility], [place] [address] [place], [place] [address]  CBG monitoring, ED     Status: Abnormal   Collection Time: 06/04/20  8:48 AM  Result Value Ref Range   Glucose-Capillary 68 (L) 70 - 99 mg/dL    Comment: Glucose reference range applies only to samples taken after fasting for at least 8 hours.  Respiratory Panel by RT PCR (Flu A&B, Covid) - Nasopharyngeal Swab     Status: None   Collection Time: 06/04/20 10:06 AM   Specimen: Nasopharyngeal Swab  Result Value Ref Range   SARS Coronavirus 2 by RT PCR NEGATIVE NEGATIVE    Comment: (NOTE) SARS-CoV-2 target nucleic acids are NOT DETECTED.  The SARS-CoV-2 RNA is generally detectable in upper respiratoy specimens during the acute phase of infection. The lowest concentration of SARS-CoV-2 viral copies this assay can detect is 131 copies/mL. A negative result does not preclude SARS-Cov-2 infection and should not be used as the sole basis for treatment or other patient management decisions.  A negative result may occur with  improper specimen collection/handling, submission of specimen other than nasopharyngeal swab, presence of viral  mutation(s) within the areas targeted by this assay, and inadequate number of viral copies (<131 copies/mL). A negative result must be combined with clinical observations, patient history, and epidemiological information. The expected result is Negative.  Fact Sheet for Patients:  PinkCheek.be  Fact Sheet for Healthcare Providers:  GravelBags.it  This test is no t yet approved or cleared by the [place] FDA and  has been authorized for detection and/or diagnosis of SARS-CoV-2 by FDA under an Emergency Use Authorization (EUA). This EUA will remain  in effect (meaning this test can be used) for the duration of the COVID-19 declaration under Section 564(b)(1) of the Act, 21 U.S.C. section 360bbb-3(b)(1), unless the authorization is terminated or revoked sooner.     Influenza A by PCR NEGATIVE NEGATIVE   Influenza B by PCR NEGATIVE NEGATIVE    Comment: (NOTE) The Xpert Xpress SARS-CoV-2/FLU/RSV assay is intended as an aid in  the diagnosis of influenza from Nasopharyngeal swab specimens and  should not be used as a sole basis for treatment. Nasal washings and  aspirates are unacceptable for Xpert Xpress SARS-CoV-2/FLU/RSV  testing.  Fact Sheet for Patients: PinkCheek.be  Fact Sheet for Healthcare Providers: GravelBags.it  This test is not yet approved or cleared by the [place] FDA and  has been authorized for detection and/or diagnosis of SARS-CoV-2 by  FDA under an Emergency Use Authorization (EUA). This EUA will remain  in effect (meaning this test can be used) for the duration of the  Covid-19 declaration under Section 564(b)(1) of the Act, 21  U.S.C. section 360bbb-3(b)(1), unless the authorization is  terminated or revoked. Performed at [facility], [place] [address] [place], [place] [address]   Rapid urine drug screen (hospital performed)      Status: None   Collection Time: 06/04/20 11:30 AM  Result Value Ref Range   Opiates NONE DETECTED NONE DETECTED   Cocaine NONE DETECTED NONE DETECTED   Benzodiazepines NONE DETECTED NONE DETECTED   Amphetamines NONE DETECTED NONE DETECTED   Tetrahydrocannabinol NONE DETECTED NONE DETECTED   Barbiturates NONE DETECTED NONE DETECTED    Comment: (NOTE) DRUG SCREEN FOR MEDICAL PURPOSES ONLY.  IF CONFIRMATION IS NEEDED FOR ANY PURPOSE, NOTIFY LAB WITHIN 5 DAYS.  LOWEST DETECTABLE LIMITS FOR URINE DRUG SCREEN Drug Class                     Cutoff (ng/mL) Amphetamine and metabolites    1000 Barbiturate and metabolites    200 Benzodiazepine                 272 Tricyclics and metabolites     300 Opiates and metabolites        300 Cocaine and metabolites        300 THC                            50 Performed at [facility], [place] [address] [place], [place] [address]   Urinalysis, Routine w reflex microscopic Urine, Random     Status: Abnormal   Collection Time: 06/04/20 11:30 AM  Result Value Ref Range   Color, Urine YELLOW YELLOW   APPearance HAZY (A) CLEAR   Specific Gravity, Urine 1.017 1.005 - 1.030   pH 5.0 5.0 - 8.0   Glucose, UA NEGATIVE  NEGATIVE mg/dL   Hgb urine dipstick NEGATIVE NEGATIVE   Bilirubin Urine NEGATIVE NEGATIVE   Ketones, ur NEGATIVE NEGATIVE mg/dL   Protein, ur NEGATIVE NEGATIVE mg/dL   Nitrite NEGATIVE NEGATIVE   Leukocytes,Ua NEGATIVE NEGATIVE    Comment: Performed at [facility] [address] [place], [place] [address]  RPR     Status: None   Collection Time: 06/04/20  2:33 PM  Result Value Ref Range   RPR Ser Ql NON REACTIVE NON REACTIVE    Comment: Performed at [facility], [place] [address], [place], [place] [address]  Vitamin B12     Status: Abnormal   Collection Time: 06/04/20  2:33 PM  Result Value Ref Range   Vitamin B-12 980 (H) 180 - 914 pg/mL    Comment: (NOTE) This assay is not validated for testing neonatal  or myeloproliferative syndrome specimens for Vitamin B12 levels. Performed at [facility], [place] [address], [place], [place] [address]   Folate     Status: None   Collection Time: 06/04/20  2:33 PM  Result Value Ref Range   Folate 36.0 >5.9 ng/mL    Comment: Performed at [facility], [place] [address], [place], [place] [address]  TSH     Status: None   Collection Time: 06/04/20  2:33 PM  Result Value Ref Range   TSH 0.694 0.350 - 4.500 uIU/mL    Comment: Performed by a 3rd Generation assay with a functional sensitivity of <=0.01 uIU/mL. Performed at [facility], [place] [address] [place], [place] [address]   Comprehensive metabolic panel     Status: Abnormal   Collection Time: 06/05/20  2:36 AM  Result Value Ref Range   Sodium 139 135 - 145 mmol/L   Potassium 3.3 (L) 3.5 - 5.1 mmol/L   Chloride 102 98 - 111 mmol/L   CO2 26 22 - 32 mmol/L   Glucose, Bld 91 70 - 99 mg/dL    Comment: Glucose reference range applies only to samples taken after fasting for at least 8 hours.   BUN 13 8 - 23 mg/dL   Creatinine, Ser 1.27 (H) 0.61 - 1.24 mg/dL   Calcium 8.8 (L) 8.9 - 10.3 mg/dL   Total Protein 6.9 6.5 - 8.1 g/dL   Albumin 3.8 3.5 - 5.0 g/dL   AST 26 15 - 41 U/L   ALT 13 0 - 44 U/L   Alkaline Phosphatase 50 38 - 126 U/L   Total Bilirubin 0.8 0.3 - 1.2 mg/dL   GFR, Estimated 58 (L) >60 mL/min   Anion gap 11 5 - 15    Comment: Performed at [facility] [address], [place], [place] [address]  CBC     Status: Abnormal   Collection Time: 06/05/20  2:36 AM  Result Value Ref Range   WBC 4.4 4.0 - 10.5 K/uL   RBC 3.99 (L) 4.22 - 5.81 MIL/uL   Hemoglobin 11.7 (L) 13.0 - 17.0 g/dL   HCT 36.0 (L) 39 - 52 %   MCV 90.2 80.0 - 100.0 fL   MCH 29.3 26.0 - 34.0 pg   MCHC 32.5 30.0 - 36.0 g/dL   RDW 13.9 11.5 - 15.5 %   Platelets 130 (L) 150 - 400 K/uL    Comment: REPEATED TO VERIFY   nRBC 0.0 0.0 - 0.2 %    Comment: Performed at [facility], [place] [address], [place], [place] [address]  Current Facility-Administered Medications  Medication Dose Route Frequency Provider Last Rate Last Admin  . acetaminophen (TYLENOL) tablet 650 mg  650 mg Oral Q6H PRN Mullis, Kiersten P, DO       Or  . acetaminophen (TYLENOL) suppository 650 mg  650 mg Rectal Q6H PRN Mullis, Kiersten P, DO      . amLODipine (NORVASC) tablet 10 mg  10 mg Oral Daily Mullis, Kiersten P, DO   10 mg at 06/05/20 1230  . benztropine (COGENTIN) tablet 1 mg  1 mg Oral BID Mullis, Kiersten P, DO   1 mg at 06/05/20 1234  . enoxaparin (LOVENOX) injection 65 mg  1 mg/kg Subcutaneous BID Mullis, Kiersten P, DO   65 mg at 06/05/20 1229  . famotidine (PEPCID) tablet 20 mg  20 mg Oral BID Mullis, Kiersten P, DO   20 mg at 06/05/20 1230  . fluPHENAZine decanoate (PROLIXIN) injection 25 mg  25 mg Intramuscular Q14 Days [place] Crane, MD   25 mg at 05/23/20 1638  . folic acid (FOLVITE) tablet 1 mg  1 mg Oral Daily Mullis, Kiersten P, DO   1 mg at 06/05/20 1234  . nicotine (NICODERM CQ - dosed in mg/24 hours) patch 14 mg  14 mg Transdermal Daily PRN Mullis, Kiersten P, DO      . tamsulosin (FLOMAX) capsule 0.8 mg  0.8 mg Oral Daily Mullis, Kiersten P, DO   0.8 mg at 06/05/20 1230   Current Outpatient Medications  Medication Sig Dispense Refill  . acetaminophen (TYLENOL) 325 MG tablet Take 2 tablets (650 mg total) by mouth every 6 (six) hours as needed for mild pain, moderate pain or headache. 30 tablet 0  . amLODipine (NORVASC) 10 MG tablet Take 10 mg by mouth daily.    . benztropine (COGENTIN) 1 MG tablet Take 1 tablet (1 mg total) by mouth 2 (two) times daily. 60 tablet 0  . cetirizine (ZYRTEC ALLERGY) 10 MG tablet Take 1 tablet (10 mg total) by mouth daily. 30 tablet 1  . famotidine (PEPCID) 20 MG tablet Take 20 mg by mouth 2 (two) times daily.    . fluPHENAZine decanoate (PROLIXIN) 25 MG/ML injection Inject 1 mL (25 mg total) into the muscle every 14 (fourteen) days. Last dose 09/07/12 (Patient  taking differently: Inject 25 mg into the muscle every 14 (fourteen) days. Last dose 05/09/2020) 5 mL 11  . fluticasone (FLONASE) 50 MCG/ACT nasal spray Place 2 sprays into both nostrils daily.    . folic acid (FOLVITE) 1 MG tablet Take 1 tablet (1 mg total) by mouth daily. 30 tablet 0  . Multiple Vitamin (MULTIVITAMIN WITH MINERALS) TABS tablet Take 1 tablet by mouth daily. 30 tablet 6  . polyethylene glycol (MIRALAX / GLYCOLAX) 17 g packet Take 17 g by mouth 2 (two) times daily.    . tamsulosin (FLOMAX) 0.4 MG CAPS capsule TAKE 2 CAPSULE BY MOUTH DAILY (Patient taking differently: Take 0.8 mg by mouth daily. ) 60 capsule 2  . thiamine 100 MG tablet Take 1 tablet (100 mg total) by mouth daily. 30 tablet 0  . vitamin C (ASCORBIC ACID) 500 MG tablet Take 500 mg by mouth daily.       Musculoskeletal: Strength & Muscle Tone: within normal limits Gait & Station: normal Patient leans: N/A  Psychiatric Specialty Exam: Physical Exam  Review of Systems  Blood pressure (!) 158/100, pulse (!) 34, temperature 98.4 F (36.9 C), resp. rate 14, SpO2 91 %.There is no height or weight on  file to calculate BMI.  General Appearance: Disheveled and hospital robe, incontinent diaper, and wearing a boot on Right foot.   Eye Contact:  Fair  Speech:  Slurred and broken, delayed pauses in between speech. History of echolalia.   Volume:  Normal  Mood:  Anxious  Affect:  Appropriate and Congruent  Thought Process:  Coherent, Linear and Descriptions of Associations: Circumstantial  Orientation:  Other:  A/O x 2  Thought Content:  Logical, Rumination and Perserveration  Suicidal Thoughts:  No  Homicidal Thoughts:  No  Memory:  Immediate;   Fair Recent;   Poor  Judgement:  Other:  appropriate, appears to be at his baseline.   Insight:  Shallow  Psychomotor Activity:  Restlessness and Tremor  Concentration:  Concentration: Poor and Attention Span: Fair  Recall:  Poor  Fund of Knowledge:  Poor  Language:   Poor  Akathisia:  Yes  Handed:  Right  AIMS (if indicated):     Assets:  Communication Skills Desire for Improvement Financial Resources/Insurance Housing Leisure Time Resilience  ADL's:  Impaired  Cognition:  Impaired,  Mild  Sleep:        Treatment Plan Summary: Plan Condition is stable.  We will continue home medications.  Will place order for patient to receive Prolixin injection tomorrow.  His dosing has been confirmed with his nurse, and chart review.  As noted in chart patient has upcoming appointment for Tuesday the 26th with [facility] team services.  Will need to coordinate care and transportation with social work to ensure patient is present for this appointment in order to guarantee ongoing management of psychiatric care and services.  Disposition: No evidence of imminent risk to self or others at present.   Patient does not meet criteria for psychiatric inpatient admission. Supportive therapy provided about ongoing stressors. Discussed crisis plan, support from social network, calling 911, coming to the Emergency Department, and calling Suicide Hotline.  Suella Broad, FNP 06/06/2020 9:20 AM

## 2020-06-05 NOTE — Evaluation (Addendum)
Occupational Therapy Evaluation Patient Details Name: [name] MRN: [id number] DOB: 11/03/1950 Today's Date: 06/05/2020    History of Present Illness Pt is a 69 y/o male admitted secondary to RLE swelling. Found to have RLE DVT and was started on lovenox. Pt with recent R foot fx and is s/p ORIF. PMH includes schizophrenia, HTN, tobacco use, and hep C.    Clinical Impression   PTA pt from [facility] but wanders away from facility and walks without walking boot to try and find wife per chart review. At time of eval, pt able to complete bed mobility with min A and sit <> stands with min-mod A +2 for safety with RW. Pt was able to mobilize to bathroom in ED (next door to room) for BM. Pt required max A for peri hygiene due to poor awareness of cleanliness. He presents with marked cognitive deficits (baseline) that requires 24/7 supervision for tasks. Given current status, recommend SNF at d/c for higher level of care (with higher security so pt cannot wander out for days in an unsafe manner) and continued therapy services to maximize independence. Will continue to follow per POC listed below.    Follow Up Recommendations  SNF;Supervision/Assistance - 24 hour    Equipment Recommendations  None recommended by OT    Recommendations for Other Services       Precautions / Restrictions Precautions Precautions: Fall Required Braces or Orthoses: Other Brace Other Brace: CAM boot RLE Restrictions Weight Bearing Restrictions: Yes RLE Weight Bearing: Weight bearing as tolerated Other Position/Activity Restrictions: in cam walker boot      Mobility Bed Mobility Overal bed mobility: Needs Assistance Bed Mobility: Supine to Sit;Sit to Supine     Supine to sit: Min assist Sit to supine: Min assist;Mod assist;+2 for safety/equipment   General bed mobility comments: Min A for trunk elevation to come to sitting. To return to supine, Pt climbing into bed in quadriped position and remained  prone until cued otherwise. required min to mod A to safely return to supine.     Transfers Overall transfer level: Needs assistance Equipment used: Rolling walker (2 wheeled);2 person hand held assist Transfers: Sit to/from Stand Sit to Stand: Min assist;Mod assist;+2 safety/equipment;+2 physical assistance         General transfer comment: Initially using B HHA, however, progressed to using walker. Min to mod A for stability.     Balance Overall balance assessment: Needs assistance Sitting-balance support: Feet supported;No upper extremity supported Sitting balance-Leahy Scale: Good     Standing balance support: Bilateral upper extremity supported;During functional activity Standing balance-Leahy Scale: Poor Standing balance comment: Reliant on BUE support                           ADL either performed or assessed with clinical judgement   ADL Overall ADL's : Needs assistance/impaired Eating/Feeding: Set up;Sitting   Grooming: Minimal assistance;Wash/dry hands;Standing;Cueing for sequencing Grooming Details (indicate cue type and reason): assist for thoroughness Upper Body Bathing: Minimal assistance;Sitting   Lower Body Bathing: Moderate assistance;Sitting/lateral leans   Upper Body Dressing : Sitting   Lower Body Dressing: Maximal assistance;Sitting/lateral leans;Sit to/from stand   Toilet Transfer: Minimal assistance;+2 for physical assistance;+2 for safety/equipment;RW;Ambulation Toilet Transfer Details (indicate cue type and reason): pt completed mobility to toilet in ED (next door to room) and required min A +2 for safety with RW navigation and safe descent onto toilet seat Toileting- Clothing Manipulation and Hygiene: Maximal assistance;Sit  to/from stand Toileting - Water quality scientist Details (indicate cue type and reason): pt started to initiate peri care, getting stool on hands and unaware- ultimately requiring max A from OT     Functional  mobility during ADLs: Minimal assistance;+2 for physical assistance;+2 for safety/equipment;Rolling walker General ADL Comments: cognitive deficits limit safe engagement in ADL without assist     Vision         Perception     Praxis      Pertinent Vitals/Pain Pain Assessment: Faces Faces Pain Scale: Hurts a little bit Pain Location: R foot Pain Descriptors / Indicators: Grimacing;Guarding Pain Intervention(s): Monitored during session;Limited activity within patient's tolerance;Repositioned     Hand Dominance     Extremity/Trunk Assessment Upper Extremity Assessment Upper Extremity Assessment: Generalized weakness   Lower Extremity Assessment Lower Extremity Assessment: Defer to PT evaluation       Communication Communication Communication: Expressive difficulties   Cognition Arousal/Alertness: Awake/alert Behavior During Therapy: WFL for tasks assessed/performed Overall Cognitive Status: History of cognitive impairments - at baseline Area of Impairment: Attention;Following commands;Memory;Safety/judgement;Awareness;Problem solving;Orientation                 Orientation Level: Situation;Time;Place;Disoriented to Current Attention Level: Focused Memory: Decreased short-term memory;Decreased recall of precautions Following Commands: Follows one step commands inconsistently;Follows one step commands with increased time Safety/Judgement: Decreased awareness of safety;Decreased awareness of deficits Awareness: Intellectual Problem Solving: Difficulty sequencing;Requires verbal cues;Requires tactile cues;Slow processing General Comments: Difficult to understand at times, but overall pleasant. Marked deficits in awareness and ability to appropriately take care of self safely.   General Comments       Exercises     Shoulder Instructions      Home Living Family/patient expects to be discharged to:: Shelter/Homeless                                  Additional Comments: pt recently d/c to shelter      Prior Functioning/Environment          Comments: Unsure as pt unable to report.         OT Problem List: Decreased strength;Decreased knowledge of use of DME or AE;Decreased knowledge of precautions;Decreased activity tolerance;Decreased cognition;Impaired balance (sitting and/or standing);Decreased safety awareness;Pain      OT Treatment/Interventions: Self-care/ADL training;Therapeutic exercise;Patient/family education;Balance training;Energy conservation;Therapeutic activities;DME and/or AE instruction;Cognitive remediation/compensation    OT Goals(Current goals can be found in the care plan section) Acute Rehab OT Goals Patient Stated Goal: unable to state OT Goal Formulation: Patient unable to participate in goal setting Time For Goal Achievement: 06/19/20 Potential to Achieve Goals: Good  OT Frequency: Min 2X/week   Barriers to D/C:            Co-evaluation PT/OT/SLP Co-Evaluation/Treatment: Yes Reason for Co-Treatment: Necessary to address cognition/behavior during functional activity;For patient/therapist safety   OT goals addressed during session: ADL's and self-care;Proper use of Adaptive equipment and DME      AM-PAC OT "6 Clicks" Daily Activity     Outcome Measure Help from another person eating meals?: A Little Help from another person taking care of personal grooming?: A Little Help from another person toileting, which includes using toliet, bedpan, or urinal?: A Lot Help from another person bathing (including washing, rinsing, drying)?: A Lot Help from another person to put on and taking off regular upper body clothing?: A Little Help from another person to put on and taking off regular lower  body clothing?: A Lot 6 Click Score: 15   End of Session Equipment Utilized During Treatment: Gait belt;Rolling walker Nurse Communication: Mobility status;Precautions  Activity Tolerance: Patient tolerated  treatment well Patient left: in bed;with call bell/phone within reach  OT Visit Diagnosis: Unsteadiness on feet (R26.81);Other abnormalities of gait and mobility (R26.89);Muscle weakness (generalized) (M62.81);Other symptoms and signs involving cognitive function                Time: 8472-0721 OT Time Calculation (min): 23 min Charges:  OT General Charges $OT Visit: 1 Visit OT Evaluation $OT Eval Moderate Complexity: Spurgeon, MSOT, OTR/L [facility] Office Number: [phone] Pager: [phone]  Zenovia Jarred 06/05/2020, 5:38 PM

## 2020-06-05 NOTE — Progress Notes (Signed)
Pt arrived to 4e from ED. Vitals obtained. Telemetry placed and CCMD notified x2 verifiers. Pt oriented to person, place, and time. CHG bath complete. Condom cath placed.

## 2020-06-05 NOTE — ED Notes (Signed)
Ordered breakfast 

## 2020-06-05 NOTE — Progress Notes (Signed)
Neurology Progress Note   S:// Seen and examined this morning.  Reports no acute complaints   O:// Current vital signs: BP (!) 143/86    Pulse 73    Temp 98 F (36.7 C) (Oral)    Resp 16    SpO2 91%  Vital signs in last 24 hours: Temp:  [98 F (36.7 C)] 98 F (36.7 C) (10/19 0848) Pulse Rate:  [37-91] 73 (10/20 0600) Resp:  [11-20] 16 (10/20 0600) BP: (124-190)/(68-105) 143/86 (10/20 0600) SpO2:  [91 %-100 %] 91 % (10/20 0600) On my examination Constitutional: Well-developed well-nourished HEENT: Poor dentition, dry oral mucous membranes. Cardiovascular: Regular rate rhythm Respiratory: Clear to auscultation Abdomen: Nondistended nontender Neurological exam He is awake, alert, able to follow commands.  Not oriented to place.  Able to tell his name. Able to name some simple objects but perseverates.  Unable to give clear history. Speech is moderately dysarthric. Extremely poor attention concentration Cranial nerves: Pupils equal round react light, extraocular movements intact, visual fields full, mild facial asymmetry but unclear if this is due to his poor dentition or a true facial weakness, tongue and palate midline. Motor examination: He moves all his extremities antigravity.  There is no asterixis but he does have mild intention and action tremor.  No resting tremor. Tone is normal. Sensation intact Coordination: Difficult to assess due to his poor attention concentration  Medications  Current Facility-Administered Medications:    acetaminophen (TYLENOL) tablet 650 mg, 650 mg, Oral, Q6H PRN **OR** acetaminophen (TYLENOL) suppository 650 mg, 650 mg, Rectal, Q6H PRN, Mullis, Kiersten P, DO   amLODipine (NORVASC) tablet 10 mg, 10 mg, Oral, Daily, Mullis, Kiersten P, DO   benztropine (COGENTIN) tablet 1 mg, 1 mg, Oral, BID, Mullis, Kiersten P, DO, 1 mg at 06/04/20 2300   enoxaparin (LOVENOX) injection 65 mg, 1 mg/kg, Subcutaneous, BID, Mullis, Kiersten P, DO, 65 mg at  06/04/20 2300   famotidine (PEPCID) tablet 20 mg, 20 mg, Oral, BID, Mullis, Kiersten P, DO, 20 mg at 06/04/20 2300   fluPHENAZine decanoate (PROLIXIN) injection 25 mg, 25 mg, Intramuscular, Q14 Days, [place] Crane, MD, 25 mg at 09/47/09 6283   folic acid (FOLVITE) tablet 1 mg, 1 mg, Oral, Daily, Mullis, Kiersten P, DO, 1 mg at 06/04/20 1726   nicotine (NICODERM CQ - dosed in mg/24 hours) patch 14 mg, 14 mg, Transdermal, Daily PRN, Mullis, Kiersten P, DO   potassium chloride (KLOR-CON) packet 40 mEq, 40 mEq, Oral, Once, Zola Button, MD   tamsulosin [facility]) capsule 0.8 mg, 0.8 mg, Oral, Daily, Mullis, Kiersten P, DO, 0.8 mg at 06/04/20 1726  Current Outpatient Medications:    acetaminophen (TYLENOL) 325 MG tablet, Take 2 tablets (650 mg total) by mouth every 6 (six) hours as needed for mild pain, moderate pain or headache., Disp: 30 tablet, Rfl: 0   amLODipine (NORVASC) 10 MG tablet, Take 10 mg by mouth daily., Disp: , Rfl:    benztropine (COGENTIN) 1 MG tablet, Take 1 tablet (1 mg total) by mouth 2 (two) times daily., Disp: 60 tablet, Rfl: 0   cetirizine (ZYRTEC ALLERGY) 10 MG tablet, Take 1 tablet (10 mg total) by mouth daily., Disp: 30 tablet, Rfl: 1   famotidine (PEPCID) 20 MG tablet, Take 20 mg by mouth 2 (two) times daily., Disp: , Rfl:    fluPHENAZine decanoate (PROLIXIN) 25 MG/ML injection, Inject 1 mL (25 mg total) into the muscle every 14 (fourteen) days. Last dose 09/07/12 (Patient taking differently: Inject 25 mg into the  muscle every 14 (fourteen) days. Last dose 05/09/2020), Disp: 5 mL, Rfl: 11   fluticasone (FLONASE) 50 MCG/ACT nasal spray, Place 2 sprays into both nostrils daily., Disp: , Rfl:    folic acid (FOLVITE) 1 MG tablet, Take 1 tablet (1 mg total) by mouth daily., Disp: 30 tablet, Rfl: 0   Multiple Vitamin (MULTIVITAMIN WITH MINERALS) TABS tablet, Take 1 tablet by mouth daily., Disp: 30 tablet, Rfl: 6   polyethylene glycol (MIRALAX / GLYCOLAX) 17 g packet,  Take 17 g by mouth 2 (two) times daily., Disp: , Rfl:    tamsulosin (FLOMAX) 0.4 MG CAPS capsule, TAKE 2 CAPSULE BY MOUTH DAILY (Patient taking differently: Take 0.8 mg by mouth daily. ), Disp: 60 capsule, Rfl: 2   thiamine 100 MG tablet, Take 1 tablet (100 mg total) by mouth daily., Disp: 30 tablet, Rfl: 0   vitamin C (ASCORBIC ACID) 500 MG tablet, Take 500 mg by mouth daily. , Disp: , Rfl:  Labs CBC    Component Value Date/Time   WBC 4.4 06/05/2020 0236   RBC 3.99 (L) 06/05/2020 0236   HGB 11.7 (L) 06/05/2020 0236   HGB 14.4 04/13/2018 1057   HCT 36.0 (L) 06/05/2020 0236   HCT 41.6 04/13/2018 1057   PLT 130 (L) 06/05/2020 0236   PLT 131 (L) 04/13/2018 1057   MCV 90.2 06/05/2020 0236   MCV 88 04/13/2018 1057   MCH 29.3 06/05/2020 0236   MCHC 32.5 06/05/2020 0236   RDW 13.9 06/05/2020 0236   RDW 13.8 04/13/2018 1057   LYMPHSABS 1.1 04/24/2020 0541   MONOABS 0.4 04/24/2020 0541   EOSABS 0.0 04/24/2020 0541   BASOSABS 0.0 04/24/2020 0541    CMP     Component Value Date/Time   NA 139 06/05/2020 0236   NA 134 05/02/2019 1711   K 3.3 (L) 06/05/2020 0236   CL 102 06/05/2020 0236   CO2 26 06/05/2020 0236   GLUCOSE 91 06/05/2020 0236   BUN 13 06/05/2020 0236   BUN 19 05/02/2019 1711   CREATININE 1.27 (H) 06/05/2020 0236   CREATININE 1.49 (H) 08/27/2016 1412   CALCIUM 8.8 (L) 06/05/2020 0236   PROT 6.9 06/05/2020 0236   PROT 6.7 05/02/2019 1711   ALBUMIN 3.8 06/05/2020 0236   ALBUMIN 4.6 05/02/2019 1711   AST 26 06/05/2020 0236   ALT 13 06/05/2020 0236   ALKPHOS 50 06/05/2020 0236   BILITOT 0.8 06/05/2020 0236   BILITOT 0.3 05/02/2019 1711   GFRNONAA 58 (L) 06/05/2020 0236   GFRNONAA 49 (L) 08/27/2016 1412   GFRAA 56 (L) 05/16/2020 1251   GFRAA 56 (L) 08/27/2016 1412   Chronic right DVT.  EEG with no sz. Mild diffuse encephalopathy Infectious work-up unremarkable thus far  Imaging I have reviewed images in epic and the results pertinent to this consultation  are: [facility] and MRI brain unremarkable for acute process.  Assessment: 69 year old, with longstanding psychiatric history, found to be confused-initial presentation in the hospital was foot pain. Question of aphasia on presentation.  Has fluctuating exam from prior notes but no lateralizing findings on the initial neurological examination by Dr. Leonel Ramsay or on my examination today. Differentials include metabolic encephalopathy versus worsening of underlying cognition/neurodegeneration. Does have some tremulousness which is voluntary versus enhanced physiological tremor.  Medication related side effects could also be playing a role in both his mentation as well as tremulousness.  Recommendations: TSH, B12, folate were within normal limits. EEG showed mild diffuse encephalopathy with no seizures or seizure.  His position. I would at this point optimize his psychiatric medications-consider psychiatry consultation. No further neurological work-up is recommended at this time.  Inpatient neurology will be available as needed-please call with questions.  -- Amie Portland, MD [facility] Pager: [phone] If 7pm to 7am, please call on call as listed on AMION.

## 2020-06-05 NOTE — Telephone Encounter (Signed)
Had a meeting with [facility] staff at [facility] along with patient and plan to give Aidenn his Prolixin D injection today too. Tredarius is in the hospital per chart and Aaron Edelman peer support called sw at hospital and he is going to be admitted. Spoke with SW myself to coordinate him getting his shot while there. SW said the injection would be given while he is in the hospital. He is being admitted due to his fx foot and pain complaints. Staff not ready on time to meet so time was short but due to Loretta not staying put at the home and frequent hospitalizations and not following the rules of the facility Ms Pamella Pert is planning to discharge him. She will give him some time because she would hate for him to be discharged from the hospital and have no where to live. [facility] who I called last week to make an ACT referral has not called back nor has [facility] re referral made for a care coordinator. Antoine Primas, peer support set up an appt for an assessment with [facility] for Tues the 26th. Aaron Edelman will work with the hospital coordinator for Linnie to be discharged to a higher level of care, one he cant walk out of. He has taken his boot off and walks excessively to [employer] are to see his wife. Staff questions if he cant find his way back to [facility], and if does or doesn't continues to live his homeless life. Will continue to monitor situation.

## 2020-06-06 DIAGNOSIS — F209 Schizophrenia, unspecified: Secondary | ICD-10-CM | POA: Diagnosis not present

## 2020-06-06 DIAGNOSIS — I824Z9 Acute embolism and thrombosis of unspecified deep veins of unspecified distal lower extremity: Secondary | ICD-10-CM | POA: Diagnosis not present

## 2020-06-06 LAB — COMPREHENSIVE METABOLIC PANEL
ALT: 13 U/L (ref 0–44)
AST: 20 U/L (ref 15–41)
Albumin: 3.6 g/dL (ref 3.5–5.0)
Alkaline Phosphatase: 44 U/L (ref 38–126)
Anion gap: 10 (ref 5–15)
BUN: 10 mg/dL (ref 8–23)
CO2: 24 mmol/L (ref 22–32)
Calcium: 8.7 mg/dL — ABNORMAL LOW (ref 8.9–10.3)
Chloride: 105 mmol/L (ref 98–111)
Creatinine, Ser: 1.19 mg/dL (ref 0.61–1.24)
GFR, Estimated: >60 mL/min (ref >60)
Glucose, Bld: 85 mg/dL (ref 70–99)
Potassium: 3.4 mmol/L — ABNORMAL LOW (ref 3.5–5.1)
Sodium: 139 mmol/L (ref 135–145)
Total Bilirubin: 0.5 mg/dL (ref 0.3–1.2)
Total Protein: 6.5 g/dL (ref 6.5–8.1)

## 2020-06-06 LAB — CBC
HCT: 33.3 % — ABNORMAL LOW (ref 39.0–52.0)
Hemoglobin: 11.4 g/dL — ABNORMAL LOW (ref 13.0–17.0)
MCH: 30.2 pg (ref 26.0–34.0)
MCHC: 34.2 g/dL (ref 30.0–36.0)
MCV: 88.3 fL (ref 80.0–100.0)
Platelets: 143 10*3/uL — ABNORMAL LOW (ref 150–400)
RBC: 3.77 MIL/uL — ABNORMAL LOW (ref 4.22–5.81)
RDW: 13.6 % (ref 11.5–15.5)
WBC: 4 10*3/uL (ref 4.0–10.5)
nRBC: 0 % (ref 0.0–0.2)

## 2020-06-06 MED ORDER — POTASSIUM CHLORIDE 20 MEQ PO PACK
40.0000 meq | PACK | Freq: Once | ORAL | Status: AC
  Administered 2020-06-06: 40 meq via ORAL
  Filled 2020-06-06: qty 2

## 2020-06-06 MED ORDER — FLUPHENAZINE DECANOATE 25 MG/ML IJ SOLN
25.0000 mg | INTRAMUSCULAR | Status: DC
  Administered 2020-06-07 – 2020-06-21 (×2): 25 mg via INTRAMUSCULAR
  Filled 2020-06-06 (×2): qty 1

## 2020-06-06 NOTE — Progress Notes (Signed)
Called by CCMD for chart review telemetry strip for V tach.  Reviewed strip, apparent artifact only.

## 2020-06-06 NOTE — Social Work (Signed)
CSW called patient's wife's daughter, Merwyn Katos- left voice to return call. CSW unable to complete assessment - patient is only alert to self.  Thurmond Butts, MSW, [place] Clinical Social Worker

## 2020-06-06 NOTE — Social Work (Signed)
CSW attempted to contact patient's wife- number number incorrect. Called [facility] - they informed no emergency contact information-   Informed RN is patient has any visitors to please contact CSW.   Thurmond Butts, MSW, [place] Clinical Social Worker

## 2020-06-06 NOTE — Progress Notes (Signed)
Family Medicine Teaching Service Daily Progress Note Intern Pager: [phone]  Patient name: [name] Medical record number: [id number] Date of birth: 1950-10-01 Age: 69 y.o. Gender: male  Primary Care Provider: Danna Hefty, DO Consultants: Neuro, Vascular Surgery Code Status: Full  Pt Overview and Major Events to Date:  Admitted 10/19  Assessment and Plan:  [name] is a 69 y.o. male presenting with RLE swelling and erythema + aphasia. PMH is significant for recent right foot crush injury s/p ORIF, primary schizophrenia, HFpEF,HTN, HLD, BPH, tobacco abuse, GERD, history of hep C s/p Harvoni and history of hep B cleared   RLE Swelling + Erythema c/f DVT Patient presenting with acute change in RLE swelling and erythema that was not noted previously, imaging prior to admission on 04/24/2020 demonstrated no evidence of DVT. [place] LE on 10/18 notable for findings consistent with chronic DVT involving the right peroneal veins without a cystic structure found in the popliteal fossa. No evidence of DVT in the lower extremity. Risk factors for DVT include recent surgery. On exam, calf asymmetry notable for right>left. Denies any calf tenderness.  Vascular recommends DOAC for 3-6 months. Barrier to [place] is housing, he has been homeless and is being dismissed from [facility], unsure of ability to take medication for 3-6 months. -Appreciate VVS recs and involvement  -Lovenox 65 mg bid for DVT treatment  -Will further discuss with team regarding long term anticoagulation therapy -PT/OT recs   Schizophrenia Presented with aphasia and RLE pain. At baseline, patient has some level of aphasia that has worsened leading to his admission. Last known normal is unknown. Has history of schizophrenia with echolalia and mumbling speech at baseline.Patient is resident of [facility] but has history of wandering off; last seen at facility on 10/15. [name] him.   Social  needs Patient homeless, he left his shelter about a month ago and this is likely affecting his medication compliance. Patient previously lived at [facility] where he is known to wander off, was last seen at the facility on 10/15. -SW consult, appreciate involvement and assistance    Thrombocytopenia Chronic and improving. Platelets 130 this morning. History of low platelet count with baseline around 100-200. Platelets of 124 on admission.   -Continue to monitor  Hypokalemia K today is 3.4. Given KCl 40 mEq of repletion. -Monitor K with BMP, repletion as needed    CKD IIIa Creatinine was 1.36 on admission, baseline 1.3-1.4. Today is 1.19.  -Continue to monitor   HTN Mildly hypertensive this morning with BP 143/86. Elevated BP on admission, with max 190/95. Home meds: Amlodipine 10mg  QD. History of taking Losartan in past. Likely elevated BP secondary to medication noncompliance given last reported taking on 10/15. -Home Amlodipine -Continue to monitor   Normocytic Anemia Chronic and stable. Hgb 11.7 this morning. Hgb 11.3 on admission, baseline 10-11. No signs of acute bleed. No known source of bleeding. Likely a contributing factor being history of CKD.  -Continue to monitor  -Recommend outpatient colonoscopy and anemia workup -Continue folic acid 1 mg daily    Right foot fracture s/p ORIF (9/14)  H/o Lisfranc fracture s/p ORIF by [name] on 9/14. Saw [name] for follow up visit on 10/14 who recommended to continue with fracture boot with weight bearing as tolerated and follow up with repeat imaging in 4 weeks. Foot x-ray on admission notable for healing fractures of the second through fifth metatarsal. No interval change from the prior  study. -Continue fracture boot -PT/OT   GERD Home med famotidine 20 mg bid. -Continue home famotidine    BPH Chronic and stable. Medications include Flomax0.8 mg daily. -Continue home Flomax   Nicotine dependence Patient has  history of nicotine dependence, on previous admission was on nicotine patch consistently. -Nicotine patch  -Smoking cessation education   FEN/GI: Liquid PPx: Lovenox  Disposition: Med-Surg  Prior to Admission Living Arrangement: Community Anticipated Discharge Location: [facility]? Barriers to Discharge: Medical Management/Placement Anticipated discharge in approximately 2-5 day(s).   Subjective:  Interviewed patient at bedside.  He is very pleasant and thankful for the care he is receiving. He has no complaints today.    Objective: Temp:  [97.9 F (36.6 C)-98.6 F (37 C)] 98.4 F (36.9 C) (10/21 0400) Pulse Rate:  [34-96] 86 (10/20 2258) Resp:  [14-18] 18 (10/20 1836) BP: (129-162)/(84-100) 162/90 (10/21 0400) SpO2:  [91 %-100 %] 100 % (10/21 0400) Physical Exam:  Physical Exam Vitals and nursing note reviewed.  Constitutional:      General: He is not in acute distress.    Appearance: Normal appearance. He is not ill-appearing, toxic-appearing or diaphoretic.  HENT:     Head: Normocephalic and atraumatic.  Cardiovascular:     Rate and Rhythm: Normal rate and regular rhythm.     Pulses: Normal pulses.          Radial pulses are 2+ on the right side and 2+ on the left side.       Dorsalis pedis pulses are 2+ on the left side.     Heart sounds: Normal heart sounds, S1 normal and S2 normal. No murmur heard.   Pulmonary:     Effort: Pulmonary effort is normal. No respiratory distress.     Breath sounds: Normal breath sounds. No wheezing.  Abdominal:     General: There is no distension.     Palpations: There is no mass.     Tenderness: There is no abdominal tenderness.  Musculoskeletal:     Left lower leg: No edema.     Comments: Could not examine Right leg due to being in knee high boot.  Neurological:     Mental Status: He is alert. Mental status is at baseline.      Laboratory: Recent Labs  Lab 06/04/20 0756 06/05/20 0236 06/06/20 0125  WBC 5.0 4.4 4.0   HGB 11.3* 11.7* 11.4*  HCT 35.6* 36.0* 33.3*  PLT 124* 130* 143*   Recent Labs  Lab 06/04/20 0756 06/05/20 0236 06/06/20 0125  NA 142 139 139  K 3.6 3.3* 3.4*  CL 105 102 105  CO2 25 26 24   BUN 17 13 10   CREATININE 1.36* 1.27* 1.19  CALCIUM 9.2 8.8* 8.7*  PROT 7.1 6.9 6.5  BILITOT 1.0 0.8 0.5  ALKPHOS 50 50 44  ALT 13 13 13   AST 26 26 20   GLUCOSE 83 91 85     Imaging/Diagnostic Tests:   MR Brain WO Contrast: No acute intracranial abnormality. Specifically, no acute infarct. Moderate to advanced chronic microvascular ischemic disease and generalized cerebral atrophy.   CT Head WO Contrast: No acute intracranial findings. Chronic microvascular ischemic change and cerebral volume loss.   DG Foot 2 Views Right: Healing fractures of the second through fifth metatarsal. No interval change from the prior study.   VAS [place] Lower Extremity Venous DVT: RIGHT: - Findings consistent with chronic deep vein thrombosis involving the right peroneal veins. - No cystic structure found in the popliteal fossa.  LEFT: - There is no evidence of deep vein thrombosis in the lower extremity.    Briant Cedar, MD 06/06/2020, 6:12 AM PGY-1, [place] Intern pager: [phone], text pages welcome

## 2020-06-06 NOTE — Progress Notes (Signed)
Got tele sitter order from Dr. Nancy Fetter. Tele sitter camera in patient's room and report called to [facility]. Waiting for tele sitter camera to configure so they can get visual. Will continue to monitor.

## 2020-06-06 NOTE — Plan of Care (Signed)
Patient is alert to self, disoriented to place, situation, and time. Fall and safety precautions are in place with patient near nurses' station and bed alarm on. Gave melatonin to help patient sleep but he is has been restless majority of the night. Patient has ortho boot on right foot from previous fracture and surgery on 9/14. First dose of Lovenox for treatment of DVT given. Neuro and vascular have been consulted. Will continue to monitor and keep patient safe.

## 2020-06-07 DIAGNOSIS — I824Z1 Acute embolism and thrombosis of unspecified deep veins of right distal lower extremity: Secondary | ICD-10-CM | POA: Diagnosis not present

## 2020-06-07 LAB — BASIC METABOLIC PANEL
Anion gap: 11 (ref 5–15)
BUN: 7 mg/dL — ABNORMAL LOW (ref 8–23)
CO2: 23 mmol/L (ref 22–32)
Calcium: 8.6 mg/dL — ABNORMAL LOW (ref 8.9–10.3)
Chloride: 105 mmol/L (ref 98–111)
Creatinine, Ser: 1.14 mg/dL (ref 0.61–1.24)
GFR, Estimated: >60 mL/min (ref >60)
Glucose, Bld: 94 mg/dL (ref 70–99)
Potassium: 3.9 mmol/L (ref 3.5–5.1)
Sodium: 139 mmol/L (ref 135–145)

## 2020-06-07 LAB — CBC
HCT: 32.6 % — ABNORMAL LOW (ref 39.0–52.0)
Hemoglobin: 10.7 g/dL — ABNORMAL LOW (ref 13.0–17.0)
MCH: 29.5 pg (ref 26.0–34.0)
MCHC: 32.8 g/dL (ref 30.0–36.0)
MCV: 89.8 fL (ref 80.0–100.0)
Platelets: 122 10*3/uL — ABNORMAL LOW (ref 150–400)
RBC: 3.63 MIL/uL — ABNORMAL LOW (ref 4.22–5.81)
RDW: 13.7 % (ref 11.5–15.5)
WBC: 3.8 10*3/uL — ABNORMAL LOW (ref 4.0–10.5)
nRBC: 0 % (ref 0.0–0.2)

## 2020-06-07 MED ORDER — RIVAROXABAN 15 MG PO TABS
15.0000 mg | ORAL_TABLET | Freq: Two times a day (BID) | ORAL | Status: DC
  Administered 2020-06-07 – 2020-06-26 (×37): 15 mg via ORAL
  Filled 2020-06-07 (×37): qty 1

## 2020-06-07 MED ORDER — RIVAROXABAN 20 MG PO TABS: 20.0000 mg | ORAL_TABLET | Freq: Every day | ORAL | Status: DC

## 2020-06-07 NOTE — Progress Notes (Signed)
Family Medicine Teaching Service Daily Progress Note Intern Pager: [phone]  Patient name: [name] Medical record number: [id number] Date of birth: 07-Jun-1951 Age: 69 y.o. Gender: male  Primary Care Provider: Danna Hefty, DO Consultants: Neurology, vascular surgery Code Status: Full  Pt Overview and Major Events to Date:  Admitted 10/19  Assessment and Plan: [name] is a 70 y.o. male presenting with RLE swelling and erythema + aphasia. PMH is significant for recent right foot crush injury s/p ORIF, primary schizophrenia, HFpEF,HTN, HLD, BPH, tobacco abuse, GERD, history of hep C s/p Harvoniandhistory of hep B cleared   RLE Swelling + Erythema c/f DVT Patient presenting with acute change in RLE swelling and erythema that was not notedpreviously, imaging prior to admission on 04/24/2020 demonstrated no evidence of DVT.[place] LE on 10/18 notable for findings consistent with chronic DVT involving the right peroneal veins without a cystic structure found in the popliteal fossa. No evidence of DVT in the lower extremity.Risk factorsfor DVT include recent surgery.  Vascular recommends DOAC for 3-6 months. Barrier to [place] is housing, he has been homeless and is being dismissed from [facility], unsure of ability to take medication for 3-6 months.  -Lovenox65 mg bidfor DVT treatment  -PT/OT rec  Schizophrenia Presented with aphasia and RLE pain. At baseline, patient has some level of aphasia that has worsened leading to his admission. Last known normal is unknown.Has history of schizophrenia with echolalia and mumbling speech at baseline.Patient is resident of [place] but has history of wandering off; last seen at facility on 10/15. [name] him. -benzotropine 1 mg bid  -fluphenazine 25 mg q14 days, last administered 10/21 -sitter present, started this morning   Social needs Patient homeless, he left his shelter about a month ago and this is  likely affecting his medication compliance. Patient previously lived at [facility] where he is known to wander off, was last seen at the facility on 10/15. -SW consult, appreciate continued involvement and assistance  Thrombocytopenia Chronic and relatively stable. Platelets 122 this morning.History of low plateletcount with baseline around 100-200. Plateletsof 124 on admission. -Continue to monitor  CKD IIIa Stable and chronic. Cr 1.14. Creatinine was 1.36 on admission, baseline 1.3-1.4. GFR >60. -Continue to monitor  HTN Hypertensive this morning of 159/85, fluctuating. Elevated BP on admission, with max 190/95. Home meds: Amlodipine 10mg  QD. History of taking Losartan in past. Likely elevated BPsecondary to medication noncompliancegiven last reported taking on 10/15. -Home amlodipine 10 mg daily -Continue to monitor  Normocytic Anemia Chronicanddowntrending but stable. Hgb 10.7 from yesterday Hgb 11.7 .Hgb 11.3 on admission, baseline 10-11. No signs of acute bleed.No known source of bleeding. Likely history of CKD contributing to his anemia status.  -Continue to monitor  -continue folic acid 1 mg daily -Recommend outpatient colonoscopy and anemia workup  Right foot fracture s/p ORIF (9/14)  H/o Lisfranc fracture s/p ORIF by [name] on 9/14. Saw [name] for follow up visit on 10/14 who recommended to continue with fracture boot with weight bearing as tolerated and follow up with repeat imaging in 4 weeks. Foot x-ray on admission notable for healing fractures of the second through fifth metatarsal. No interval change from the prior study. Fracture boot in place. -Continue fracture boot -PT/OT  GERD Home med famotidine 20 mg bid. -Continue home famotidine  BPH Chronic and stable. Medications include Flomax0.8 mg daily. -Continue home Flomax  Nicotine dependence Patient has history of nicotine dependence, on previous  admission was on nicotine patch  consistently. -Nicotine patch -Smoking cessation education    FEN/GI: liquid diet  PPx: Lovenox    Status is: Inpatient   Disposition: possibly SNF, pending safe disposition     Subjective:  Patient sleeping throughout my exam. Sitter present, denies any acute, overnight events given to her at sign out.   Objective: Temp:  [97.8 F (36.6 C)-98.5 F (36.9 C)] 97.8 F (36.6 C) (10/22 0550) Pulse Rate:  [67-75] 67 (10/22 0550) Resp:  [16-20] 18 (10/22 0550) BP: (133-174)/(74-97) 159/85 (10/22 0550) SpO2:  [99 %-100 %] 100 % (10/22 0550) Physical Exam: General: Patient sleeping comfortably, in no acute distress. Cardiovascular: RRR, no murmurs or gallops auscultated  Respiratory: lungs clear to auscultation bilaterally  Abdomen: soft, nontender, presence of active bowel sounds  Extremities: radial pulses intact bilaterally, left distal pulse strong and intact, no LLE edema noted, right fracture boot in place Unable to assess neuro and psych components of exam due to patient asleep and did not want to participate when asked.   Laboratory: Recent Labs  Lab 06/05/20 0236 06/06/20 0125 06/07/20 0039  WBC 4.4 4.0 3.8*  HGB 11.7* 11.4* 10.7*  HCT 36.0* 33.3* 32.6*  PLT 130* 143* 122*   Recent Labs  Lab 06/04/20 0756 06/04/20 0756 06/05/20 0236 06/06/20 0125 06/07/20 0039  NA 142   < > 139 139 139  K 3.6   < > 3.3* 3.4* 3.9  CL 105   < > 102 105 105  CO2 25   < > 26 24 23   BUN 17   < > 13 10 7*  CREATININE 1.36*   < > 1.27* 1.19 1.14  CALCIUM 9.2   < > 8.8* 8.7* 8.6*  PROT 7.1  --  6.9 6.5  --   BILITOT 1.0  --  0.8 0.5  --   ALKPHOS 50  --  50 44  --   ALT 13  --  13 13  --   AST 26  --  26 20  --   GLUCOSE 83   < > 91 85 94   < > = values in this interval not displayed.      Imaging/Diagnostic Tests: No results found.  [name], DO 06/07/2020, 6:31 AM PGY-1, [place] Intern pager: [phone], text pages welcome

## 2020-06-07 NOTE — Progress Notes (Signed)
Family Medicine Teaching Service Daily Progress Note Intern Pager: [phone]  Patient name: [name] Medical record number: [id number] Date of birth: Oct 08, 1950 Age: 69 y.o. Gender: male  Primary Care Provider: Danna Hefty, DO Consultants: Neurology, vascular surgery, psychiatry   Code Status: Full Code  Pt Overview and Major Events to Date:  10/19 Admitted  Assessment and Plan: [name] is a 69 y.o. male presenting with RLE swelling and erythema + aphasia. PMH is significant for recent right foot crush injury s/p ORIF, primary schizophrenia, HFpEF,HTN, HLD, BPH, tobacco abuse, GERD, history of hep C s/p Harvoniandhistory of hep B cleared. Medically stable for discharge, dispo pending.  RLE DVT Patient with RLE swelling and erythema found to have chronic DVT involving the right peroneal veins without a cystic structure found in the popliteal fossa on DVT study 10/18. Treatment with DOAC for 3-6 months per vascular surgery. Barrier to [place] is housing, he has been homeless and is being dismissed from [facility], unsure of ability to take medication for 3-6 months. - Appreciate VVS recs and involvement - rivaroxaban - PT/OT recs  Schizophrenia Stable. Presented with aphasia worse than baseline. Last known normal is unknown.Has history of schizophrenia with echolalia and mumbling speech at baseline. Patient is resident of [place] but has history of wandering off; last seen at facility on 10/15. [name] him. - psychiatry consulted, appreciate involvement - s/p Prolixin (25 mg q14d) injection 10/22 - benztropine 1 mg BID  Social needs Patient homeless, he left his shelter about a month ago and this is likely affecting his medication compliance. Patient previously lived at [facility] where he is known to wander off, was last seen at the facility on 10/15. -SW consult, appreciate involvement and assistance  Thrombocytopenia Chronic and  improving.History of low plateletcount with baseline around 100-200. Platelets 120 this AM.  -Continue to monitor  CKD IIIa Creatinine was 1.36 on admission, baseline 1.3-1.4. Stable, Cr 1.30 this AM.  -Continue to monitor  HTN Home meds: Amlodipine 10mg  QD. Previously took losartan. Adequate control, BP 141/96 this AM. - Home Amlodipine  Normocytic Anemia Chronicandstable.Hgb 11.3 on admission, baseline 10-11. No signs of acute bleed. CKD likely contributing.  -Continue to monitor  -Recommend outpatient colonoscopy and anemia workup -Continue folic acid1 mg daily  Right foot fracture H/o Lisfranc fracture s/p ORIF by [name] on 9/14. Saw [name] for follow up visit on 10/14 who recommended to continue with fracture boot with weight bearing as tolerated and follow up with repeat imaging in 4 weeks. Foot x-ray on admission notable for healing fractures of the second through fifth metatarsal. No interval change from the prior study. -Continue fracture boot -PT/OT  Other problems chronic and stable: GERD BPH  FEN/GI: full liquid diet PPx: rivaroxaban  Disposition: possibly SNF, pending safe disposition  Subjective:  Feels good, no concerns. Sitter at bedside. Patient was awake for 2 hours overnight but otherwise no issues.  Objective: Temp:  [97.6 F (36.4 C)-98.8 F (37.1 C)] 97.6 F (36.4 C) (10/23 0357) Pulse Rate:  [56-74] 62 (10/23 0357) Resp:  [14-18] 16 (10/23 0357) BP: (130-150)/(71-96) 141/96 (10/23 0357) SpO2:  [97 %-100 %] 100 % (10/23 0357) Physical Exam: General: Sleeping but awakens easily, resting comfortably in bed, NAD Cardiovascular: RRR, no murmurs Respiratory: CTAB Abdomen: soft, non-tender, +BS Extremities: WWP, no edema, fracture boot in place on the right  Laboratory: Recent Labs  Lab 06/06/20 0125 06/07/20 0039 06/08/20 0358  WBC 4.0 3.8* 4.0  HGB 11.4* 10.7* 12.0*  HCT 33.3* 32.6* 36.2*  PLT 143* 122* 120*   Recent  Labs  Lab 06/04/20 0756 06/04/20 0756 06/05/20 0236 06/05/20 0236 06/06/20 0125 06/07/20 0039 06/08/20 0358  NA 142   < > 139   < > 139 139 139  K 3.6   < > 3.3*   < > 3.4* 3.9 3.9  CL 105   < > 102   < > 105 105 102  CO2 25   < > 26   < > 24 23 29   BUN 17   < > 13   < > 10 7* 8  CREATININE 1.36*   < > 1.27*   < > 1.19 1.14 1.30*  CALCIUM 9.2   < > 8.8*   < > 8.7* 8.6* 9.3  PROT 7.1  --  6.9  --  6.5  --   --   BILITOT 1.0  --  0.8  --  0.5  --   --   ALKPHOS 50  --  50  --  44  --   --   ALT 13  --  13  --  13  --   --   AST 26  --  26  --  20  --   --   GLUCOSE 83   < > 91   < > 85 94 86   < > = values in this interval not displayed.    Imaging/Diagnostic Tests: No new imaging.  Zola Button, MD 06/08/2020, 6:14 AM PGY-1, [place] Intern pager: [phone], text pages welcome

## 2020-06-08 DIAGNOSIS — I824Z1 Acute embolism and thrombosis of unspecified deep veins of right distal lower extremity: Secondary | ICD-10-CM | POA: Diagnosis not present

## 2020-06-08 DIAGNOSIS — S93326S Dislocation of tarsometatarsal joint of unspecified foot, sequela: Secondary | ICD-10-CM

## 2020-06-08 DIAGNOSIS — F209 Schizophrenia, unspecified: Secondary | ICD-10-CM | POA: Diagnosis not present

## 2020-06-08 LAB — BASIC METABOLIC PANEL
Anion gap: 8 (ref 5–15)
BUN: 8 mg/dL (ref 8–23)
CO2: 29 mmol/L (ref 22–32)
Calcium: 9.3 mg/dL (ref 8.9–10.3)
Chloride: 102 mmol/L (ref 98–111)
Creatinine, Ser: 1.3 mg/dL — ABNORMAL HIGH (ref 0.61–1.24)
GFR, Estimated: 60 mL/min — ABNORMAL LOW (ref >60)
Glucose, Bld: 86 mg/dL (ref 70–99)
Potassium: 3.9 mmol/L (ref 3.5–5.1)
Sodium: 139 mmol/L (ref 135–145)

## 2020-06-08 LAB — CBC
HCT: 36.2 % — ABNORMAL LOW (ref 39.0–52.0)
Hemoglobin: 12 g/dL — ABNORMAL LOW (ref 13.0–17.0)
MCH: 29.9 pg (ref 26.0–34.0)
MCHC: 33.1 g/dL (ref 30.0–36.0)
MCV: 90.3 fL (ref 80.0–100.0)
Platelets: 120 10*3/uL — ABNORMAL LOW (ref 150–400)
RBC: 4.01 MIL/uL — ABNORMAL LOW (ref 4.22–5.81)
RDW: 13.7 % (ref 11.5–15.5)
WBC: 4 10*3/uL (ref 4.0–10.5)
nRBC: 0 % (ref 0.0–0.2)

## 2020-06-08 MED ORDER — HALOPERIDOL LACTATE 5 MG/ML IJ SOLN
1.0000 mg | INTRAMUSCULAR | Status: AC | PRN
  Administered 2020-06-08 (×2): 1 mg via INTRAVENOUS
  Filled 2020-06-08 (×2): qty 1

## 2020-06-08 NOTE — Progress Notes (Signed)
Pt became very anxious and agitated and continually tried to get out of bed and leave room.  Pt is very preoccupied with the status of his wife as he has been unable to get in touch with in a few days. Four staff members required to  Assist with Pt.  MD notified and came to room.  PRN Haldol ordered.  Eventually Pt calmed down on his own.

## 2020-06-08 NOTE — Progress Notes (Signed)
Pt became very agitated again.  PRN Haldol given.  Will reassess in 1 hr.

## 2020-06-08 NOTE — Plan of Care (Signed)
  Problem: Education: Goal: Knowledge of General Education information will improve Description Including pain rating scale, medication(s)/side effects and non-pharmacologic comfort measures Outcome: Progressing   

## 2020-06-09 DIAGNOSIS — F209 Schizophrenia, unspecified: Secondary | ICD-10-CM | POA: Diagnosis not present

## 2020-06-09 DIAGNOSIS — I824Z1 Acute embolism and thrombosis of unspecified deep veins of right distal lower extremity: Secondary | ICD-10-CM | POA: Diagnosis not present

## 2020-06-09 MED ORDER — HALOPERIDOL LACTATE 5 MG/ML IJ SOLN
1.0000 mg | INTRAMUSCULAR | Status: AC | PRN
  Administered 2020-06-09 (×2): 1 mg via INTRAVENOUS
  Filled 2020-06-09 (×3): qty 1

## 2020-06-09 NOTE — Progress Notes (Signed)
Family Medicine Teaching Service Daily Progress Note Intern Pager: [phone]  Patient name: [name] Medical record number: [id number] Date of birth: 1951/04/18 Age: 69 y.o. Gender: male  Primary Care Provider: Danna Hefty, DO Consultants: Neurology, psychiatry, vascular surgery Code Status: Full  Pt Overview and Major Events to Date:  Admitted 10/19  Assessment and Plan: CHASKE PASKETT is a 69 y.o. male presenting with RLE swelling and erythema + aphasia. PMH is significant for recent right foot crush injury s/p ORIF, primary [name], HFpEF,HTN, HLD, BPH, tobacco abuse, GERD, history of hep C s/p Harvoniandhistory of hep B cleared. Medically stable for discharge, dispo pending.  RLE DVT Patient with RLE swelling and erythema found to have chronic DVT involving the right peroneal veins without a cystic structure found in the popliteal fossa on DVT study 10/18. Treatment with DOAC for 3-6 months per vascular surgery.Barrier to [place] is housing, he has been homeless and is being dismissed from [facility], unsure of ability to take medication for 3-6 months. - Appreciate VVS recommendations  - rivaroxaban - PT/OT recs  [name] Stable. Presented with aphasia worse than baseline. Last known normal is unknown.Has history of [name] with echolalia and mumbling speech at baseline. Patient is resident of [place] but has history of wandering off; last seen at facility on 10/15.[name]. - psychiatry following, appreciate involvement - s/p Prolixin (25 mg q14d) injection (last given 10/22) - benztropine 1 mg BID -to minimize disturbance contributing to agitation: labs to be drawn every 2-3 days, avoid am labs  -sitter when available   Social needs Patient homeless, he left his shelter about a month ago and this is likely affecting his medication compliance. Patient previously lived at [facility] where he is known to wander off, was  last seen at the facility on 10/15. -SW consult, appreciate involvement and assistance  Thrombocytopenia Chronic and improving.History of low plateletcount with baseline around 100-200. Most recent platelets 120 on 10/23.  -Continue to monitor CBC  CKD IIIa Creatinine was1.36 on admission, baseline 1.3-1.4. Stable, most recent Cr 1.30 and GFR 60 on 10/23. -Continue to monitor  HTN Hypertensive 153/97 last night. Home meds: Amlodipine 10mg  QD. Previously took losartan.  -continue home Amlodipine  Normocytic Anemia Chronicandimproving.Most recent Hgb 12 on 10/23. Hgb 11.3 on admission, baseline 10-11. No signs of acute bleed. Likely secondary to anemia of chronic disease from history of CKD.  -Continue to monitor  -Recommend outpatient colonoscopy and anemia workup -Continue folic acid1 mg daily  Right foot fracture H/o Lisfranc fracture s/p ORIF by [name] on 9/14. Saw [name] for follow up visit on 10/14 who recommended to continue with fracture boot with weight bearing as tolerated and follow up with repeat imaging in 4 weeks. Foot x-ray on admission notable for healing fractures of the second through fifth metatarsal. No interval change from the prior study. -Continue fracture boot -PT/OT  FEN/GI: liquid diet PPx: rivaroxaban    Status is: Inpatient  Disposition: possibly SNF, pending safe discharge        Subjective:  Overnight patient became agitated and required haldol during early morning labs. Sitter not present this morning, when I inquired nursing staff reports that he will likely not be receiving a sitter today due to staffing shortage.  Objective: Temp:  [98.4 F (36.9 C)-98.7 F (37.1 C)] 98.4 F (36.9 C) (10/23 2020) Pulse Rate:  [62-83] 83 (10/23 2020) Resp:  [18-20] 20 (10/23 2020) BP: (136-153)/(77-97) 153/97 (  10/23 2020) SpO2:  [100 %] 100 % (10/23 2020) Physical Exam: General: Patient sleeping comfortably in bed, in no acute  distress. Cardiovascular: RRR, no murmurs auscultated  Respiratory: lungs clear to auscultation bilaterally  Abdomen: soft, presence of active bowel sounds Extremities: radial pulses intact bilaterally   Laboratory: Recent Labs  Lab 06/06/20 0125 06/07/20 0039 06/08/20 0358  WBC 4.0 3.8* 4.0  HGB 11.4* 10.7* 12.0*  HCT 33.3* 32.6* 36.2*  PLT 143* 122* 120*   Recent Labs  Lab 06/04/20 0756 06/04/20 0756 06/05/20 0236 06/05/20 0236 06/06/20 0125 06/07/20 0039 06/08/20 0358  NA 142   < > 139   < > 139 139 139  K 3.6   < > 3.3*   < > 3.4* 3.9 3.9  CL 105   < > 102   < > 105 105 102  CO2 25   < > 26   < > 24 23 29   BUN 17   < > 13   < > 10 7* 8  CREATININE 1.36*   < > 1.27*   < > 1.19 1.14 1.30*  CALCIUM 9.2   < > 8.8*   < > 8.7* 8.6* 9.3  PROT 7.1  --  6.9  --  6.5  --   --   BILITOT 1.0  --  0.8  --  0.5  --   --   ALKPHOS 50  --  50  --  44  --   --   ALT 13  --  13  --  13  --   --   AST 26  --  26  --  20  --   --   GLUCOSE 83   < > 91   < > 85 94 86   < > = values in this interval not displayed.      Imaging/Diagnostic Tests: No results found.  [name], DO 06/09/2020, 7:08 AM PGY-1, [place] Intern pager: [phone], text pages welcome

## 2020-06-09 NOTE — Progress Notes (Signed)
Pt was being combative this am, now asleep, did not take morning vitals. Will continue to monitor.

## 2020-06-09 NOTE — Progress Notes (Signed)
Received page from RN that patient had calmed after as needed Haldol and was sleeping, however lab came in around 3 AM this morning.  This woke him up and made him extremely agitated, attempted to hit lab staff.  Since this time he has been trying to get out of bed constantly without assistance and is a harm to himself.  No current sitter coverage is available.   Evaluated at bedside.  Staff member currently sitting with patient, however cannot stay and immediately jumps up if she is not present. High risk for fall and injury. He is alert and repeating back my phrases.  50% intelligible.  Given that he is a harm to himself at this time, replaced PRN Haldol order for limited dosing.  Lights off, turned TV to minimal volume, and staff increased room temp per his preference.  Additionally replaced 1: 1 sitter to restart ASAP when available.  Transitioned labs q. afternoon and will discuss with day team to continue labs during the day only if necessary to be obtained.  Patriciaann Clan, DO

## 2020-06-09 NOTE — TOC Initial Note (Signed)
Transition of Care [facility]) - Initial/Assessment Note    Patient Details  Name: [name] MRN: [id number] Date of Birth: [birth year]  Transition of Care [facility]) CM/SW Contact:    Joanne Chars, LCSW Phone Number: 06/09/2020, 9:32 AM  Clinical Narrative:  CSW attempted to meet with pt but was asked by RN not to wake him.  CSW attempted to reach pt wife and listed phone number is disconnected.  CSW spoke with Santiago Glad at [facility], but was disconnected from the call multiple times.  CSW was given the following information by [facility] Advocate: pt has been a resident since 05/10/20 and is able to return.  He has been gone since 10/16.  Pt wife lives somewhere off [place] and pt leaves [place] and walks to her apartment frequently.  Wife allows him to stay for a period of time and then makes him leave and he typically ends up at the ED.  Santiago Glad did not have records but said all residents at [facility] must be vaccinated.  Santiago Glad also reports that an Therapist, sports with a [employer] badge visited [place] on 10/20--then Santiago Glad said RN may have been part of an ACT team.  CSW was waiting for any name and contact number for this person and was again cut off and could not reconnect.                  Expected Discharge Plan: Assisted Living Barriers to Discharge: Continued Medical Work up   Patient Goals and CMS Choice        Expected Discharge Plan and Services Expected Discharge Plan: Assisted Living       Living arrangements for the past 2 months: [place] ([facility])                                      Prior Living Arrangements/Services Living arrangements for the past 2 months: [place] ([place]) Lives with:: Facility Resident          Need for Family Participation in Patient Care: Yes (Comment) Care giver support system in place?: No (comment)      Activities of Daily Living      Permission Sought/Granted                   Emotional Assessment       Orientation: : Oriented to Self Alcohol / Substance Use: Not Applicable Psych Involvement: Yes (comment)  Admission diagnosis:  DVT (deep venous thrombosis) (HCC) [I82.409] Acute encephalopathy [G93.40] DVT (deep vein thrombosis) in pregnancy [O22.30] AMS (altered mental status) [R41.82] Patient Active Problem List   Diagnosis Date Noted  . AMS (altered mental status) 06/05/2020  . H/O noncompliance with medical treatment, presenting hazards to health   . DVT (deep venous thrombosis) ([place]) 06/04/2020  . Schizophrenia ([place])   . Homeless   . Foot pain, right   . AKI (acute kidney injury) ([place]) 05/12/2020  . Dislocation of tarsometatarsal joint   . Closed displaced fracture of fifth metatarsal bone of right foot   . History of hepatitis B 04/26/2020  . Chronic hepatitis ([place])   . Crush injury   . Dementia without behavioral disturbance ([place])   . Fever of unknown origin   . Fever 04/24/2020  . Atrial fibrillation with RVR ([place]) 04/24/2020  . Malnutrition of moderate degree 04/24/2020  .  Acute metabolic encephalopathy 74/03/1447  . SIRS (systemic inflammatory response syndrome) ([place]) 04/23/2020  . Acute encephalopathy 04/23/2020  . GERD (gastroesophageal reflux disease) 05/03/2019  . Hypotension 08/02/2018  . Low back sprain, initial encounter 10/21/2017  . History of drug abuse ([place]) 10/18/2017  . Gingival erythema 09/23/2017  . Chronic pain of left knee 11/24/2016  . Tobacco abuse   . Diastolic heart failure ([place]) 02/05/2015  . Healthcare maintenance 05/28/2014  . Hyperlipidemia 03/10/2014  . Patellar tendonitis 11/16/2012  . [place] OCCLUSION 06/03/2010  . PARANOID SCHIZOPHRENIA, CHRONIC 01/17/2009  . HYPERTENSION, BENIGN ESSENTIAL 01/17/2009  . BPH (benign prostatic hyperplasia) 01/17/2009  . History of hepatitis C 12/14/2008  . DEPRESSION 12/14/2008   PCP:  Danna Hefty, DO Pharmacy:   [place], [place] [address] Phone: [phone] Fax: [phone]  [id number] - [place], [place] - [address] [place] [address] Phone: [phone] Fax: [phone]  [id number] - [place], [place] - [address] [place] [address] Phone: [phone] Fax: [place], [place] [address] [place] [address] Phone: [phone] Fax: [phone]  [facility] [place], [place] - [address] [place] [address] Phone: [phone] Fax: [phone]     Social Determinants of Health ([place]) Interventions    Readmission Risk Interventions No flowsheet data found.

## 2020-06-09 NOTE — Progress Notes (Signed)
On call MD paged as pt was constantly trying to get oob after lab staff tried to get his am labs. Dr. Higinio Plan came and assessed pt at bedside. See MAR for new orders.

## 2020-06-09 NOTE — Progress Notes (Signed)
Pt has been trying to get OOB constantly, as long as somebody is in room with patient redirecting him, he seems little better . No sitter coverage. Pt has been very agitated, PRN Halodol given. Bed alarm on. Staff member in the room. Will continue to monitor.

## 2020-06-09 NOTE — Discharge Instructions (Signed)
Information on my medicine - XARELTO (rivaroxaban)  This medication education was reviewed with me or my healthcare representative as part of my discharge preparation.  WHY WAS XARELTO PRESCRIBED FOR YOU? Xarelto was prescribed to treat blood clots that may have been found in the veins of your legs (deep vein thrombosis) or in your lungs (pulmonary embolism) and to reduce the risk of them occurring again.  What do you need to know about Xarelto? The starting dose is one 15 mg tablet taken TWICE daily with food for the FIRST 21 DAYS then on 11/12  the dose is changed to one 20 mg tablet taken ONCE A DAY with your evening meal.  DO NOT stop taking Xarelto without talking to the health care provider who prescribed the medication.  Refill your prescription for 20 mg tablets before you run out.  After discharge, you should have regular check-up appointments with your healthcare provider that is prescribing your Xarelto.  In the future your dose may need to be changed if your kidney function changes by a significant amount.  What do you do if you miss a dose? If you are taking Xarelto TWICE DAILY and you miss a dose, take it as soon as you remember. You may take two 15 mg tablets (total 30 mg) at the same time then resume your regularly scheduled 15 mg twice daily the next day.  If you are taking Xarelto ONCE DAILY and you miss a dose, take it as soon as you remember on the same day then continue your regularly scheduled once daily regimen the next day. Do not take two doses of Xarelto at the same time.   Important Safety Information Xarelto is a blood thinner medicine that can cause bleeding. You should call your healthcare provider right away if you experience any of the following: ? Bleeding from an injury or your nose that does not stop. ? Unusual colored urine (red or dark brown) or unusual colored stools (red or black). ? Unusual bruising for unknown reasons. ? A serious fall or if  you hit your head (even if there is no bleeding).  Some medicines may interact with Xarelto and might increase your risk of bleeding while on Xarelto. To help avoid this, consult your healthcare provider or pharmacist prior to using any new prescription or non-prescription medications, including herbals, vitamins, non-steroidal anti-inflammatory drugs (NSAIDs) and supplements.  This website has more information on Xarelto: [email or web address]

## 2020-06-09 NOTE — Progress Notes (Signed)
FPTS Interim Progress Note  S: Per social work, patient requires a COVID vaccine to return back to SNF at [facility]. Went to acquire consent from patient regarding this. Patient lacks capacity to consent at this time. Per nursing staff, informed that patient's wife [name]) is currently in a different SNF than patient. Wife was hospitalized for about 8 weeks in March 2021 which is when Mr. Blancett would come visit her multiple times. Nurse did not notice daughter ever visiting either parent. Unsure of status of daughter at this time.   O: BP 140/87 (BP Location: Left Arm)   Pulse 61   Temp 98.1 F (36.7 C) (Oral)   Resp 18   SpO2 99%   General: Sleeping comfortably in bed, in no acute distress. Neuro: AOx1, patient lacks capacity at this time, mumbling speech, makes appropriate eye contact, easily aroused by voice  A/P: -COVID vaccine order placed -sitter when nursing staff allows given shortage -consistent redirection -continue with current plan   [name], DO 06/09/2020, 12:16 PM PGY-1, [place] Medicine Service pager [phone]

## 2020-06-10 DIAGNOSIS — I824Z1 Acute embolism and thrombosis of unspecified deep veins of right distal lower extremity: Secondary | ICD-10-CM | POA: Diagnosis not present

## 2020-06-10 DIAGNOSIS — F209 Schizophrenia, unspecified: Secondary | ICD-10-CM | POA: Diagnosis not present

## 2020-06-10 NOTE — Progress Notes (Signed)
Patient received around 8 AM this morning. Patient is AAOx3 (self, place, and time) Patient has been cooperative and pleasant. No issues on behavior and has been compliant with staying in bed when reoriented. Patient ate all of his breakfast and has taken all his medication this morning including new prescribed Xarelto. Patient assess with second nurse, skin intact with boot on RLE.  Will continue to frequently round on patient and sitting close to patient's room if any assistance is needed. Sitter ordered but staffing not available.

## 2020-06-10 NOTE — Progress Notes (Addendum)
Occupational Therapy Treatment Note  Pt found walking in hall, appeared to be looking for social worker (had Cam boot on). Fall prevented as pt trying to unsafely sit in rolling chair. Pt assisted back to room with HHA. Pt also looking for something to eat on his tray. Attempted to have pt sit in recliner with use of posey seat belt alarm, however pt with increased restlessness and did not want seat belt, therefore pt returned to bed with bed alarm on.  Recommend all food be placed on pt's bedside table and trays be removed from room once pt finished to reduce pt desire to get OOB to look for tray. Recommend staff walk pt throughout the day using hand held assist to reduce restlessness. Pt is WBAT with camboot.  Continue to recommend DC to SNF   06/10/20 1500  OT Visit Information  Last OT Received On 06/10/20  Assistance Needed +1  History of Present Illness Pt is a 69 y/o male admitted secondary to RLE swelling. Found to have RLE DVT and was started on lovenox. Pt with recent R foot fx and is s/p ORIF. PMH includes schizophrenia, HTN, tobacco use, and hep C.   Precautions  Precautions Fall  Required Braces or Orthoses Other Brace  Other Brace CAM boot RLE  Pain Assessment  Pain Assessment Faces  Faces Pain Scale 0  Cognition  Arousal/Alertness Awake/alert  Behavior During Therapy Impulsive  Overall Cognitive Status History of cognitive impairments - at baseline  Current Attention Level Sustained  Memory Decreased short-term memory  Following Commands Follows one step commands with increased time  Safety/Judgement Decreased awareness of safety;Decreased awareness of deficits  Awareness Intellectual  Problem Solving Slow processing  Upper Extremity Assessment  Upper Extremity Assessment Overall WFL for tasks assessed  Lower Extremity Assessment  Lower Extremity Assessment RLE deficits/detail  RLE Deficits / Details RLE in cam boot throughout secondary to recent surgery  ADL  Overall  ADL's  Needs assistance/impaired  Grooming Set up;Min guard;Standing  Upper Body Bathing Minimal assistance;Sitting  Toilet Transfer Minimal assistance (HHA)  Functional mobility during ADLs Minimal assistance (HHA)  Bed Mobility  Overal bed mobility Modified Independent  Balance  Overall balance assessment Needs assistance  Sitting balance-Leahy Scale Good  Standing balance-Leahy Scale Fair  Restrictions  RLE Weight Bearing WBAT  Other Position/Activity Restrictions in cam walker boot  Transfers  Overall transfer level Modified independent  OT - End of Session  Equipment Utilized During Treatment Gait belt  Activity Tolerance Patient tolerated treatment well  Patient left in bed;with call bell/phone within reach;with bed alarm set  Nurse Communication Mobility status;Other (comment) (room set up)  OT Assessment/Plan  OT Plan Discharge plan remains appropriate  OT Visit Diagnosis Unsteadiness on feet (R26.81);Other abnormalities of gait and mobility (R26.89);Muscle weakness (generalized) (M62.81);Other symptoms and signs involving cognitive function  OT Frequency (ACUTE ONLY) Min 2X/week  Follow Up Recommendations SNF;Supervision/Assistance - 24 hour  OT Equipment None recommended by OT  AM-PAC OT "6 Clicks" Daily Activity Outcome Measure (Version 2)  Help from another person eating meals? 3  Help from another person taking care of personal grooming? 3  Help from another person toileting, which includes using toliet, bedpan, or urinal? 3  Help from another person bathing (including washing, rinsing, drying)? 3  Help from another person to put on and taking off regular upper body clothing? 3  Help from another person to put on and taking off regular lower body clothing? 2  6 Click Score 17  OT Goal Progression  Progress towards OT goals Progressing toward goals  Acute Rehab OT Goals  Patient Stated Goal to get something to eat  OT Goal Formulation Patient unable to  participate in goal setting  Time For Goal Achievement 06/19/20  Potential to Achieve Goals Good  ADL Goals  Pt Will Perform Grooming with supervision;standing  Pt Will Perform Upper Body Dressing with set-up;sitting  Pt Will Perform Lower Body Dressing with min assist;sitting/lateral leans;sit to/from stand  Pt Will Transfer to Toilet with supervision;ambulating;regular height toilet  Pt Will Perform Toileting - Clothing Manipulation and hygiene with min guard assist;sit to/from stand  Additional ADL Goal #1 Pt will follow one step commands with min VC's  Additional ADL Goal #2 Pt will reduce risk of falls by recalling 3/5 unsafe actions during ADL with mod VCs  OT Time Calculation  OT Start Time (ACUTE ONLY) 1355  OT Stop Time (ACUTE ONLY) 1430  OT Time Calculation (min) 35 min  OT General Charges  $OT Visit 1 Visit  OT Treatments  $Self Care/Home Management  23-37 mins  Maurie Boettcher, OT/L   Acute OT Clinical Specialist Acute Rehabilitation Services Pager [phone] Office [phone]

## 2020-06-10 NOTE — Plan of Care (Signed)
  Problem: Education: Goal: Knowledge of General Education information will improve Description Including pain rating scale, medication(s)/side effects and non-pharmacologic comfort measures Outcome: Progressing   

## 2020-06-10 NOTE — Progress Notes (Addendum)
Family Medicine Teaching Service Daily Progress Note Intern Pager: [phone]  Patient name: [name] Medical record number: [id number] Date of birth: [birth year] Age: 69 y.o. Gender: male  Primary Care Provider: Danna Hefty, DO Consultants: Neurology, psychiatry, vascular surgery Code Status: Full  Pt Overview and Major Events to Date:  Admitted 10/19  Assessment and Plan: Corey Pettingill Iveryis a 69 y.o.malepresenting with RLE swelling and erythema + aphasia. PMH is significant for recent right foot crush injury s/p ORIF, primary schizophrenia, HFpEF,HTN, HLD, BPH, tobacco abuse, GERD, history of hep C s/p Harvoniandhistory of hep B cleared. Medically stable for discharge, dispo pending.  RLE DVT PatientwithRLE swelling and erythema found to havechronic DVT involving the right peroneal veins without a cystic structure found in the popliteal fossa on DVT study 10/18.Treatment withDOAC for 3-6 months per vascular surgery.Barrier to [place] is housing, he has been homeless and is being dismissed from [facility], unsure of ability to take medication for 3-6 months. - Appreciate VVS recommendations  -rivaroxaban - PT/OT recs  Schizophrenia Stable.Presented withaphasia worse than baseline. Last known normal is unknown.Has history of schizophrenia with echolalia and mumbling speech at baseline. Patient is resident of [facility] but has history of wandering off; last seen at facility on 10/15.[name] to [facility] him. - psychiatry following, appreciate involvement - s/p Prolixin (25 mg q14d) injection (last given 10/22) - benztropine 1 mg BID -to minimize disturbance contributing to agitation: labs to be drawn every 2-3 days, avoid am labs  -sitter when available, discussed with nursing staff today, order renewed   Social needs Patient homeless, he left his shelter about a month ago and this is likely affecting his medication compliance. Patient previously  lived at [facility] where he is known to wander off, was last seen at the facility on 10/15. -SW consult, appreciate involvement and assistance -COVID vaccination for returning to SNF   Thrombocytopenia Chronic and improving.History of low plateletcount with baseline around 100-200. Most recent platelets120on 10/23. -Continue to monitor CBC  CKD IIIa Creatinine was1.36 on admission, baseline 1.3-1.4.Stable, most recent Cr 1.30 and GFR 60 on 10/23. -Continue to monitor  HTN Most recent BP 109/86. Home meds: Amlodipine 10mg  QD.Previously took losartan. -continue home Amlodipine  Normocytic Anemia Chronicandimproving.Most recent Hgb 12 on 10/23. Hgb 11.3 on admission, baseline 10-11. No signs of acute bleed. Likely secondary to anemia of chronic disease from history of CKD.  -Continue to monitor  -Recommend outpatient colonoscopy and anemia workup -Continue folic acid1 mg daily  Right foot fracture H/o Lisfranc fracture s/p ORIF by [name] on 9/14. Saw [name] for follow up visit on 10/14 who recommended to continue with fracture boot with weight bearing as tolerated and follow up with repeat imaging in 4 weeks. Foot x-ray on admission notable for healing fractures of the second through fifth metatarsal. No interval change from the prior study. -Continue fracture boot -PT/OT   FEN/GI: regular diet, awaiting SLP evaluation  PPx: rivaroxaban    Status is: Inpatient    Disposition: SNF, pending COVID vaccination     Subjective:  Patient is able to communicate to me that he is not in any pain. Still lacks capacity as I attempted to get consent for COVID vaccination required for SNF. Talked to nursing staff to determine if patient will be able to get a sitter, ordered renewed.   Objective: Temp:  [97.6 F (36.4 C)-98.1 F (36.7 C)] 98 F (36.7 C) (10/24 1940) Pulse Rate:  [61-80] 74 (  10/24 1940) Resp:  [18-20] 20 (10/24 1808) BP: (106-140)/(78-87)  109/86 (10/24 1940) SpO2:  [99 %-100 %] 100 % (10/24 1940) Physical Exam: General: Patient sleeping comfortably in bed, in no acute distress. Cardiovascular: RRR, no murmurs or gallops auscultated Respiratory: lungs clear to auscultation bilaterally, breathing comfortably on room air  Abdomen: soft, nontender, presence of active bowel sounds  Extremities: bilateral radial and left dorsalis pedis pulses grossly intact, fracture foot in place on the right, no LLE edema noted  Neuro: AOx1, some incoherent speech noted, easily arousable to voice stimulation   Laboratory: Recent Labs  Lab 06/06/20 0125 06/07/20 0039 06/08/20 0358  WBC 4.0 3.8* 4.0  HGB 11.4* 10.7* 12.0*  HCT 33.3* 32.6* 36.2*  PLT 143* 122* 120*   Recent Labs  Lab 06/04/20 0756 06/04/20 0756 06/05/20 0236 06/05/20 0236 06/06/20 0125 06/07/20 0039 06/08/20 0358  NA 142   < > 139   < > 139 139 139  K 3.6   < > 3.3*   < > 3.4* 3.9 3.9  CL 105   < > 102   < > 105 105 102  CO2 25   < > 26   < > 24 23 29   BUN 17   < > 13   < > 10 7* 8  CREATININE 1.36*   < > 1.27*   < > 1.19 1.14 1.30*  CALCIUM 9.2   < > 8.8*   < > 8.7* 8.6* 9.3  PROT 7.1  --  6.9  --  6.5  --   --   BILITOT 1.0  --  0.8  --  0.5  --   --   ALKPHOS 50  --  50  --  44  --   --   ALT 13  --  13  --  13  --   --   AST 26  --  26  --  20  --   --   GLUCOSE 83   < > 91   < > 85 94 86   < > = values in this interval not displayed.      Imaging/Diagnostic Tests: No results found.  [name], DO 06/10/2020, 6:29 AM PGY-1, [place] Intern pager: [phone] text pages welcome

## 2020-06-10 NOTE — Evaluation (Signed)
Clinical/Bedside Swallow Evaluation Patient Details  Name: [name] MRN: [id number] Date of Birth: March 16, 1951  Today's Date: 06/10/2020 Time: SLP Start Time (ACUTE ONLY): 1155 SLP Stop Time (ACUTE ONLY): 1213 SLP Time Calculation (min) (ACUTE ONLY): 18 min  Past Medical History:  Past Medical History:  Diagnosis Date  . BPH (benign prostatic hyperplasia)   . Colon polyp 2010  . Depression   . GERD (gastroesophageal reflux disease)   . Hepatitis C    "caught it when I had a blood transfusion"  . High cholesterol   . History of blood transfusion    "when I was young"  . Hypertension   . Paranoid schizophrenia ([place])   . Prostate atrophy   . Retinal vein occlusion    Past Surgical History:  Past Surgical History:  Procedure Laterality Date  . CIRCUMCISION  1959  . ORIF TOE FRACTURE Right 04/30/2020   Procedure: OPEN REDUCTION INTERNAL FIXATION (ORIF) RIGHT FOOT METATARSAL FRACTURES;  Surgeon: Newt Minion, MD;  Location: [facility];  Service: Orthopedics;  Laterality: Right;  . TONSILLECTOMY     HPI:  Pt is a 69 y.o. male with PMH is significant for recent right foot crush injury s/p ORIF, primary schizophrenia, aphasia, HFpEF, HTN, HLD, BPH, tobacco abuse, GERD, history of hep C s/p Harvoni, history of hep B cleared who presented with RLE swelling and erythema, and worsening aphasia. MRI brain was negative. EEG demonstrated mild diffuse encephalopathy of nonspecific etiology without any seizure activity.    Assessment / Plan / Recommendation Clinical Impression  Pt was seen for bedside swallow evaluation. Per medical record, pt has a history of aphasia and he was unable to provide any meaningful history. Oral mechanism exam was limited due to pt's difficulty following commands and dentition was limited. He tolerated all solids and liquids without signs or symptoms of oropharyngeal dysphagia. He was seen during lunch and he demonstrated impulsive tendencies with use of large boluses  and an increased intake rate. However, no s/sx of aspiration were noted despite these behaviors. It is recommended that a regular texture diet with thin liquids is be continued at this time and further skilled SLP services are not clinically indicated for swallowing.  SLP Visit Diagnosis: Dysphagia, unspecified (R13.10)    Aspiration Risk  No limitations    Diet Recommendation Regular;Thin liquid   Liquid Administration via: Cup;Straw Medication Administration: Whole meds with puree Supervision: Patient able to self feed Compensations: Minimize environmental distractions Postural Changes: Seated upright at 90 degrees    Other  Recommendations Oral Care Recommendations: Oral care BID   Follow up Recommendations None      Frequency and Duration            Prognosis        Swallow Study   General Date of Onset: 06/09/20 HPI: Pt is a 69 y.o. male with PMH is significant for recent right foot crush injury s/p ORIF, primary schizophrenia, HFpEF, HTN, HLD, BPH, tobacco abuse, GERD, history of hep C s/p Harvoni, history of hep B cleared who presented with RLE swelling and erythema + aphasia. MRI brain was negative. EEG demonstrated mild diffuse encephalopathy of nonspecific etiology without any seizure activity.  Type of Study: Bedside Swallow Evaluation Previous Swallow Assessment: None Diet Prior to this Study: Regular;Thin liquids Temperature Spikes Noted: No Respiratory Status: Room air History of Recent Intubation: No Behavior/Cognition: Alert;Cooperative;Pleasant mood Oral Cavity Assessment: Within Functional Limits Oral Care Completed by SLP: No Oral Cavity - Dentition: Missing dentition  Vision: Functional for self-feeding Self-Feeding Abilities: Able to feed self Patient Positioning: Upright in bed;Postural control adequate for testing Baseline Vocal Quality: Normal Volitional Swallow: Able to elicit    Oral/Motor/Sensory Function Overall Oral Motor/Sensory Function:  Within functional limits   Ice Chips Ice chips: Not tested   Thin Liquid Thin Liquid: Within functional limits Presentation: Straw    Nectar Thick Nectar Thick Liquid: Not tested   Honey Thick Honey Thick Liquid: Not tested   Puree Puree: Within functional limits Presentation: Spoon   Solid     Solid: Within functional limits Presentation: Emanuel I. Hardin Negus, [place], [place] Office number [phone] Pager [place] 06/10/2020,12:25 PM

## 2020-06-10 NOTE — Progress Notes (Signed)
Physical Therapy Treatment Patient Details Name: [name] MRN: [id number] DOB: 1951-05-26 Today's Date: 06/10/2020    History of Present Illness Pt is a 69 y/o male admitted secondary to RLE swelling. Found to have RLE DVT and was started on lovenox. Pt with recent R foot fx and is s/p ORIF. PMH includes schizophrenia, HTN, tobacco use, and hep C.     PT Comments    Patient progressing slowly towards PT goals. Requires max cues for safety and assist with using RW as pt wanting to pick up it and hold it off ground during gait. Able to be redirected during session. Difficult to understand at times. Improved ambulation distance with min A for safety/balance. Some SOB noted with activity. Pt self limiting distance. Asking to go to the train station. Continue to recommend SNF. Will follow.   Follow Up Recommendations  SNF;Supervision/Assistance - 24 hour     Equipment Recommendations  None recommended by PT    Recommendations for Other Services       Precautions / Restrictions Precautions Precautions: Fall Required Braces or Orthoses: Other Brace Other Brace: CAM boot RLE Restrictions Weight Bearing Restrictions: Yes RLE Weight Bearing: Weight bearing as tolerated Other Position/Activity Restrictions: in cam walker boot    Mobility  Bed Mobility Overal bed mobility: Needs Assistance Bed Mobility: Sit to Supine       Sit to supine: Min guard;HOB elevated   General bed mobility comments: Repetition of cues to return to supine. No physical assist needed.  Transfers Overall transfer level: Needs assistance Equipment used: Rolling walker (2 wheeled);None Transfers: Sit to/from Stand Sit to Stand: Min assist         General transfer comment: HHA to steady and assist with standing; reaching for RW once upright. Holding RW off ground.  Ambulation/Gait Ambulation/Gait assistance: Min assist;+2 safety/equipment Gait Distance (Feet): 60 Feet Assistive device: Rolling  walker (2 wheeled) Gait Pattern/deviations: Step-through pattern;Decreased stride length;Step-to pattern Gait velocity: Decreased Gait velocity interpretation: <1.31 ft/sec, indicative of household ambulator General Gait Details: Slow, step to gait with antalgic like pattern due to CAM boot on RLE and leg length discrepancy. Holding RW and lifting off the ground. needs assist for RW management and to help with rolling it instead of carrying it.   Stairs             Wheelchair Mobility    Modified Rankin (Stroke Patients Only)       Balance Overall balance assessment: Needs assistance Sitting-balance support: Feet supported;No upper extremity supported Sitting balance-Leahy Scale: Good     Standing balance support: During functional activity Standing balance-Leahy Scale: Fair Standing balance comment: Reliant on at least 1 UE support, close Min guard. Pt stopping at counter and looking through personal belongings bag and placing things in there without LOB.                            Cognition Arousal/Alertness: Awake/alert Behavior During Therapy: WFL for tasks assessed/performed Overall Cognitive Status: Difficult to assess Area of Impairment: Attention;Following commands;Memory;Safety/judgement;Awareness;Problem solving;Orientation                 Orientation Level: Situation;Time;Place;Disoriented to Current Attention Level: Focused Memory: Decreased short-term memory;Decreased recall of precautions Following Commands: Follows one step commands inconsistently;Follows one step commands with increased time Safety/Judgement: Decreased awareness of safety;Decreased awareness of deficits Awareness: Intellectual Problem Solving: Difficulty sequencing;Requires verbal cues;Requires tactile cues;Slow processing General Comments: Difficult to understand at times,  but overall pleasant. Marked deficits in awareness, safety and ability to appropriately take care  of self safely. Asking to go to the train station. Re-directable.      Exercises      General Comments        Pertinent Vitals/Pain Pain Assessment: Faces Faces Pain Scale: No hurt    Home Living                      Prior Function            PT Goals (current goals can now be found in the care plan section) Progress towards PT goals: Progressing toward goals (slowly)    Frequency    Min 2X/week      PT Plan Current plan remains appropriate    Co-evaluation              AM-PAC PT "6 Clicks" Mobility   Outcome Measure  Help needed turning from your back to your side while in a flat bed without using bedrails?: A Little Help needed moving from lying on your back to sitting on the side of a flat bed without using bedrails?: A Little Help needed moving to and from a bed to a chair (including a wheelchair)?: A Little Help needed standing up from a chair using your arms (e.g., wheelchair or bedside chair)?: A Little Help needed to walk in hospital room?: A Little Help needed climbing 3-5 steps with a railing? : A Lot 6 Click Score: 17    End of Session Equipment Utilized During Treatment: Gait belt Activity Tolerance: Patient tolerated treatment well Patient left: in bed;with call bell/phone within reach;with bed alarm set Nurse Communication: Mobility status PT Visit Diagnosis: Unsteadiness on feet (R26.81);Muscle weakness (generalized) (M62.81);Other symptoms and signs involving the nervous system (R29.898)     Time: 1214-1229 PT Time Calculation (min) (ACUTE ONLY): 15 min  Charges:  $Therapeutic Activity: 8-22 mins                     Marisa Severin, PT, DPT Acute Rehabilitation Services Pager [phone] Office [phone]       [name] 06/10/2020, 12:42 PM

## 2020-06-10 NOTE — Plan of Care (Signed)
  Problem: Education: Goal: Knowledge of General Education information will improve Description: Including pain rating scale, medication(s)/side effects and non-pharmacologic comfort measures Outcome: Progressing   Problem: Health Behavior/Discharge Planning: Goal: Ability to manage health-related needs will improve Outcome: Progressing   Problem: Clinical Measurements: Goal: Ability to maintain clinical measurements within normal limits will improve Outcome: Progressing Goal: Will remain free from infection Outcome: Progressing Goal: Diagnostic test results will improve Outcome: Progressing Goal: Respiratory complications will improve Outcome: Progressing Goal: Cardiovascular complication will be avoided Outcome: Progressing   Problem: Activity: Goal: Risk for activity intolerance will decrease Outcome: Progressing   Problem: Nutrition: Goal: Adequate nutrition will be maintained Outcome: Progressing   Problem: Coping: Goal: Level of anxiety will decrease Outcome: Progressing   Problem: Elimination: Goal: Will not experience complications related to bowel motility Outcome: Progressing Goal: Will not experience complications related to urinary retention Outcome: Progressing   Problem: Pain Managment: Goal: General experience of comfort will improve Outcome: Progressing   Problem: Safety: Goal: Ability to remain free from injury will improve Outcome: Progressing   Problem: Skin Integrity: Goal: Risk for impaired skin integrity will decrease Outcome: Progressing   Problem: Education: Goal: Utilization of techniques to improve thought processes will improve Outcome: Progressing Goal: Knowledge of the prescribed therapeutic regimen will improve Outcome: Progressing   Problem: Activity: Goal: Interest or engagement in leisure activities will improve Outcome: Progressing Goal: Imbalance in normal sleep/wake cycle will improve Outcome: Progressing   Problem:  Coping: Goal: Coping ability will improve Outcome: Progressing Goal: Will verbalize feelings Outcome: Progressing   Problem: Health Behavior/Discharge Planning: Goal: Ability to make decisions will improve Outcome: Progressing Goal: Compliance with therapeutic regimen will improve Outcome: Progressing   Problem: Role Relationship: Goal: Will demonstrate positive changes in social behaviors and relationships Outcome: Progressing   Problem: Safety: Goal: Ability to disclose and discuss suicidal ideas will improve Outcome: Progressing Goal: Ability to identify and utilize support systems that promote safety will improve Outcome: Progressing   Problem: Self-Concept: Goal: Will verbalize positive feelings about self Outcome: Progressing Goal: Level of anxiety will decrease Outcome: Progressing   

## 2020-06-10 NOTE — Progress Notes (Signed)
Report given to [facility] RN , pt is transferring to [facility] 11.

## 2020-06-10 NOTE — Plan of Care (Signed)

## 2020-06-11 DIAGNOSIS — I824Z1 Acute embolism and thrombosis of unspecified deep veins of right distal lower extremity: Secondary | ICD-10-CM | POA: Diagnosis not present

## 2020-06-11 DIAGNOSIS — E440 Moderate protein-calorie malnutrition: Secondary | ICD-10-CM | POA: Diagnosis not present

## 2020-06-11 DIAGNOSIS — F209 Schizophrenia, unspecified: Secondary | ICD-10-CM | POA: Diagnosis not present

## 2020-06-11 LAB — CBC WITH DIFFERENTIAL/PLATELET
Abs Immature Granulocytes: 0.01 10*3/uL (ref 0.00–0.07)
Basophils Absolute: 0 10*3/uL (ref 0.0–0.1)
Basophils Relative: 1 %
Eosinophils Absolute: 0.1 10*3/uL (ref 0.0–0.5)
Eosinophils Relative: 3 %
HCT: 35.8 % — ABNORMAL LOW (ref 39.0–52.0)
Hemoglobin: 11.9 g/dL — ABNORMAL LOW (ref 13.0–17.0)
Immature Granulocytes: 0 %
Lymphocytes Relative: 34 %
Lymphs Abs: 1.4 10*3/uL (ref 0.7–4.0)
MCH: 30.1 pg (ref 26.0–34.0)
MCHC: 33.2 g/dL (ref 30.0–36.0)
MCV: 90.4 fL (ref 80.0–100.0)
Monocytes Absolute: 0.5 10*3/uL (ref 0.1–1.0)
Monocytes Relative: 11 %
Neutro Abs: 2.1 10*3/uL (ref 1.7–7.7)
Neutrophils Relative %: 51 %
Platelets: 149 10*3/uL — ABNORMAL LOW (ref 150–400)
RBC: 3.96 MIL/uL — ABNORMAL LOW (ref 4.22–5.81)
RDW: 13.6 % (ref 11.5–15.5)
WBC: 4.1 10*3/uL (ref 4.0–10.5)
nRBC: 0 % (ref 0.0–0.2)

## 2020-06-11 LAB — BASIC METABOLIC PANEL
Anion gap: 9 (ref 5–15)
BUN: 15 mg/dL (ref 8–23)
CO2: 26 mmol/L (ref 22–32)
Calcium: 8.7 mg/dL — ABNORMAL LOW (ref 8.9–10.3)
Chloride: 104 mmol/L (ref 98–111)
Creatinine, Ser: 1.6 mg/dL — ABNORMAL HIGH (ref 0.61–1.24)
GFR, Estimated: 47 mL/min — ABNORMAL LOW (ref >60)
Glucose, Bld: 86 mg/dL (ref 70–99)
Potassium: 4.3 mmol/L (ref 3.5–5.1)
Sodium: 139 mmol/L (ref 135–145)

## 2020-06-11 MED ORDER — RAMELTEON 8 MG PO TABS
8.0000 mg | ORAL_TABLET | Freq: Every day | ORAL | Status: DC
  Administered 2020-06-11 – 2020-06-26 (×16): 8 mg via ORAL
  Filled 2020-06-11 (×16): qty 1

## 2020-06-11 NOTE — TOC Progression Note (Signed)
Transition of Care [facility]) - Progression Note    Patient Details  Name: [name] MRN: [id number] Date of Birth: 1950-12-24  Transition of Care [facility]) CM/SW [place], [place] Phone Number: 06/11/2020, 1:56 PM  Clinical Narrative:     CSW called [name] at [phone]. [name] repeats that she does not have pt's daughter or wife's phone numbers. [name] states that she may have family members who can get in touch with them but is unable to provide CSW with phone numbers of her family members. She agrees to ask her family members to contact pt's wife or daughter and inform them to call CSW.   CSW called and left message with pt's Daughter [phone]  Expected Discharge Plan: Assisted Living Barriers to Discharge: Continued Medical Work up  Expected Discharge Plan and Services Expected Discharge Plan: Assisted Living       Living arrangements for the past 2 months: [place] ([place])                                       Social Determinants of Health (SDOH) Interventions    Readmission Risk Interventions No flowsheet data found.

## 2020-06-11 NOTE — Progress Notes (Signed)
Family Medicine Teaching Service Daily Progress Note Intern Pager: [phone]  Patient name: [name] Medical record number: [id number] Date of birth: March 16, 1951 Age: 69 y.o. Gender: male  Primary Care Provider: Danna Hefty, DO Consultants: Neurology, psychiatry, vascular surgery  Code Status: Full   Pt Overview and Major Events to Date:  Admitted 10/19  Assessment and Plan: [name] a 69 y.o.malepresenting with RLE swelling and erythema + aphasia. PMH is significant for recent right foot crush injury s/p ORIF, primary schizophrenia, HFpEF,HTN, HLD, BPH, tobacco abuse, GERD, history of hep C s/p Harvoniandhistory of hep B cleared. Medically stable for discharge, dispo pending.  RLE DVT PatientwithRLE swelling and erythema found to havechronic DVT involving the right peroneal veins without a cystic structure found in the popliteal fossa on DVT study 10/18.Treatment withDOAC for 3-6 months per vascular surgery.Barrier to [place] is housing, he has been homeless and is being dismissed from [facility], unsure of ability to take medication for 3-6 months. -rivaroxaban - PT/OT recs, pending safe disposition to SNF   Schizophrenia Stable.Presented withaphasia worse than baseline. Last known normal is unknown.Has history of schizophrenia with echolalia and mumbling speech at baseline. Patient is resident of [facility] but has history of wandering off; last seen at facility on 10/15.[name] to [facility] him. - psychiatryfollowing, appreciateinvolvement - s/p Prolixin (25 mg q14d) injection(last given10/22) - benztropine 1 mg BID -to minimize disturbance contributing to agitation: labs to be drawn every 2-3 days, avoid am labs  -sitter when available  Social needs Patient homeless, he left his shelter about a month ago and this is likely affecting his medication compliance. Patient previously lived at [facility] where he is known to wander off, was  last seen at the facility on 10/15. Reached out to SW again due to unable to get in contact with any family members and patient lacks capacity to make decisions such as COVID vaccination administration. Also concern that previous facility that patient came from was not receiving the appropriate supervision of medication administration which is an ongoing concern given that patient is receiving new anticoagulation on discharge. Difficult to provide anticoagulation education due to patient's current mental status.  -SW consult, appreciate involvement and assistance   Thrombocytopenia Chronic and improving.History of low plateletcount with baseline around 100-200.Most recent platelets120on 10/23. -Continue to monitorCBC, pending noon labs   CKD IIIa Creatinine was1.36 on admission, baseline 1.3-1.4.Stable,most recentCr 1.30and GFR 60 on 10/23. -Continue to monitor, pending noon labs   HTN Most recent BP 125/89.Home meds: Amlodipine 10mg  QD.Previously took losartan. -continue home Amlodipine  Normocytic Anemia Chronicandimproving.Most recent Hgb 12 on 10/23.Hgb 11.3 on admission, baseline 10-11. No signs of acute bleed.Likely secondary to anemia of chronic disease from history of CKD. -Continue to monitor, pending labs at noon today  -Recommend outpatient colonoscopy and anemia workup -Continue folic acid1 mg daily  Right foot fracture H/o Lisfranc fracture s/p ORIF by [name] on 9/14. Saw [name] for follow up visit on 10/14 who recommended to continue with fracture boot with weight bearing as tolerated and follow up with repeat imaging in 4 weeks. Foot x-ray on admission notable for healing fractures of the second through fifth metatarsal. No interval change from the prior study. -Continue fracture boot -PT/OT, OT recommends SNF    FEN/GI: regular diet  PPx: rivaroxaban    Disposition: awaiting SNF, appreciate SW assistance and involvement         Subjective:  No significant events reported overnight.  Objective: Temp:  [97.8 F (36.6 C)-98.6 F (37 C)] 97.8 F (36.6 C) (10/25 2153) Pulse Rate:  [59-79] 63 (10/25 2153) Resp:  [15-16] 16 (10/25 2153) BP: (110-134)/(75-89) 125/89 (10/25 2153) SpO2:  [94 %-100 %] 100 % (10/25 2153) Physical Exam: General: Patient sleeping comfortably, in no acute distress. Cardiovascular: RRR, no murmurs or gallops auscultated  Respiratory: lungs clear to auscultation bilaterally, breathing comfortably on room air  Abdomen: soft, nontender Extremities: left dorsalis pedis pulses intact, no LLE edema noted, right fracture boot in place  Derm: skin warm and dry to touch, no new rashes or lesions noted  Neuro: easily arousable to voice   Laboratory: Recent Labs  Lab 06/06/20 0125 06/07/20 0039 06/08/20 0358  WBC 4.0 3.8* 4.0  HGB 11.4* 10.7* 12.0*  HCT 33.3* 32.6* 36.2*  PLT 143* 122* 120*   Recent Labs  Lab 06/04/20 0756 06/04/20 0756 06/05/20 0236 06/05/20 0236 06/06/20 0125 06/07/20 0039 06/08/20 0358  NA 142   < > 139   < > 139 139 139  K 3.6   < > 3.3*   < > 3.4* 3.9 3.9  CL 105   < > 102   < > 105 105 102  CO2 25   < > 26   < > 24 23 29   BUN 17   < > 13   < > 10 7* 8  CREATININE 1.36*   < > 1.27*   < > 1.19 1.14 1.30*  CALCIUM 9.2   < > 8.8*   < > 8.7* 8.6* 9.3  PROT 7.1  --  6.9  --  6.5  --   --   BILITOT 1.0  --  0.8  --  0.5  --   --   ALKPHOS 50  --  50  --  44  --   --   ALT 13  --  13  --  13  --   --   AST 26  --  26  --  20  --   --   GLUCOSE 83   < > 91   < > 85 94 86   < > = values in this interval not displayed.      Imaging/Diagnostic Tests: No results found.  [name], DO 06/11/2020, 7:22 AM PGY-1, [place] Intern pager: [phone], text pages welcome

## 2020-06-11 NOTE — TOC Progression Note (Signed)
Transition of Care [facility]) - Progression Note    Patient Details  Name: [name] MRN: [id number] Date of Birth: 11/03/1950  Transition of Care [facility]) CM/SW [place], [place] Phone Number: 06/11/2020, 9:58 AM  Clinical Narrative:     CSW attempted to speak with pt about SNF rec. Pt has limited capacity. Pt is agreeable to CSW contacting his daughter and wife but pt is unable to provide phone numbers. Pt does provide a phone number [phone]) but is incomprehensible when explaining who it is.   CSW called phone number and discovered number is for pt's wife's cousin, [name]. [name] does not know pt's daughter or wife's contact information. [name] states that her daughter may have the contact information but is at work. [name] takes CSW's contact information to contact CSW when [name]'s daughter is off work.   [name] also states that pt's daughter is currently at [facility] with Covid. She states her name is Education officer, environmental.   Expected Discharge Plan: Assisted Living Barriers to Discharge: Continued Medical Work up  Expected Discharge Plan and Services Expected Discharge Plan: Assisted Living       Living arrangements for the past 2 months: [place] ([place])                                       Social Determinants of Health (SDOH) Interventions    Readmission Risk Interventions No flowsheet data found.

## 2020-06-12 ENCOUNTER — Encounter (HOSPITAL_COMMUNITY): Payer: Self-pay | Admitting: Family Medicine

## 2020-06-12 DIAGNOSIS — I824Z9 Acute embolism and thrombosis of unspecified deep veins of unspecified distal lower extremity: Secondary | ICD-10-CM | POA: Diagnosis not present

## 2020-06-12 DIAGNOSIS — R4189 Other symptoms and signs involving cognitive functions and awareness: Secondary | ICD-10-CM | POA: Diagnosis not present

## 2020-06-12 DIAGNOSIS — F209 Schizophrenia, unspecified: Secondary | ICD-10-CM | POA: Diagnosis not present

## 2020-06-12 NOTE — Progress Notes (Signed)
Was paged by RN at 10:24pm and 4:13 am about patient that he continued to get off the bed and walk around and was worried about his fall risk. Unable to provide a sitter because of short staff. Saw patient and he was pleasant and not agitated. Put an order in for soft abdomenal restraint until the morning when a sitter is available.

## 2020-06-12 NOTE — Progress Notes (Signed)
Family Medicine Teaching Service Daily Progress Note Intern Pager: [phone]  Patient name: [name] Medical record number: [id number] Date of birth: 1951-05-03 Age: 69 y.o. Gender: male  Primary Care Provider: Danna Hefty, DO Consultants: Neurology, psychiatry, vascular surgery  Code Status: Full  Pt Overview and Major Events to Date:  Admitted 10/19  Assessment and Plan: Cornellius Kropp Iveryis a 69 y.o.malepresenting with RLE swelling and erythema + aphasia. PMH is significant for recent right foot crush injury s/p ORIF, primary schizophrenia, HFpEF,HTN, HLD, BPH, tobacco abuse, GERD, history of hep C s/p Harvoniandhistory of hep B cleared. Medically stable for discharge, dispo pending.  RLE DVT PatientwithRLE swelling and erythema found to havechronic DVT involving the right peroneal veins without a cystic structure found in the popliteal fossa on DVT study 10/18.Treatment withDOAC for 3-6 months per vascular surgery.Barrier to [place] is housing, he has been homeless and is being dismissed from [facility], unsure of ability to take medication for 3-6 months. -rivaroxaban - PT/OT recs, pending safe disposition to SNF   Schizophrenia Stable.Presented withaphasia worse than baseline. Last known normal is unknown.Has history of schizophrenia with echolalia and mumbling speech at baseline. Patient is resident of [facility] but has history of wandering off; last seen at facility on 10/15[name] him. - psychiatryfollowing, appreciateinvolvement - s/p Prolixin (25 mg q14d) injection(last given10/22) - benztropine 1 mg BID -to minimize disturbance contributing to agitation: labs to be drawn every 2-3 days, avoid am labs  -ramelteon 8 mg bedtime -sitter when available  Social needs Patient homeless, he left his shelter about a month ago and this is likely affecting his medication compliance. Patient previously lived at [facility] where he is  known to wander off, was last seen at the facility on 10/15. Reached out to SW again due to unable to get in contact with any family members and patient lacks capacity to make decisions such as COVID vaccination administration. Also concern that previous facility that patient came from was not receiving the appropriate supervision of medication administration which is an ongoing concern given that patient is receiving new anticoagulation on discharge. Difficult to provide anticoagulation education due to patient's current mental status.  -SW consult, appreciate involvement and assistance in touching base with wife and SNF placement   Thrombocytopenia Chronic and improving.History of low plateletcount with baseline around 100-200.Most recent platelets149on 10/26. -Continue to monitorCBC  CKD IIIa Creatinine was1.36 on admission, baseline 1.3-1.4.Most recentCr 1.60and GFR 47 on 10/26. -Continue to monitor  HTN Most recent BP 147/79.Home meds: Amlodipine 10mg  QD.Previously took losartan. -continue home Amlodipine  Normocytic Anemia Chronicandstable.Most recent Hgb 11.9 on 10/26.Hgb 11.3 on admission, baseline 10-11. No signs of acute bleed.Likely secondary to anemia of chronic disease from history of CKD. -Continue to monitor -Recommend outpatient colonoscopy and anemia workup -Continue folic acid1 mg daily  Right foot fracture H/o Lisfranc fracture s/p ORIF by [name] on 9/14. Saw [name] for follow up visit on 10/14 who recommended to continue with fracture boot with weight bearing as tolerated and follow up with repeat imaging in 4 weeks. Foot x-ray on admission notable for healing fractures of the second through fifth metatarsal. No interval change from the prior study. -Continue fracture boot -PT/OT, OT recommends SNF    FEN/GI: regular diet  PPx: rivaroxaban    Disposition: medically stable, awaiting SNF         Subjective:  Overnight  patient tried to get out of bed, given fall risk,  he was given ramelteon. Unable to get a sitter overnight due to nurse shortage, due to this patient was placed in soft restraints until sitter could be available. This morning, sitter present in the room and no soft restraints in place. Patient states that he feels "better" which is more vocal than patient has been on previous encounters. Sitter reports that he has been eating well and generally takes a nap after his meal.   Objective: Temp:  [97.6 F (36.4 C)-98 F (36.7 C)] 98 F (36.7 C) (10/27 0357) Pulse Rate:  [55-68] 55 (10/27 0357) Resp:  [16-18] 16 (10/27 0357) BP: (118-147)/(78-88) 147/79 (10/27 0357) SpO2:  [95 %-100 %] 100 % (10/27 0357) Physical Exam: General: Patient sleeping comfortably in bed, in no acute distress. Cardiovascular: RRR, no murmurs or gallops auscultated  Respiratory: lungs clear to auscultation bilaterally, breathing comfortably on room air Abdomen: soft, nontender, presence of active bowel sounds  Extremities: left dorsalis pedis intact, right fracture foot in place, no LLE edema noted  Neuro: AOx2, easily arousable to voice  Psych: no agitation or aggression noted   Laboratory: Recent Labs  Lab 06/07/20 0039 06/08/20 0358 06/11/20 1514  WBC 3.8* 4.0 4.1  HGB 10.7* 12.0* 11.9*  HCT 32.6* 36.2* 35.8*  PLT 122* 120* 149*   Recent Labs  Lab 06/06/20 0125 06/06/20 0125 06/07/20 0039 06/08/20 0358 06/11/20 1514  NA 139   < > 139 139 139  K 3.4*   < > 3.9 3.9 4.3  CL 105   < > 105 102 104  CO2 24   < > 23 29 26   BUN 10   < > 7* 8 15  CREATININE 1.19   < > 1.14 1.30* 1.60*  CALCIUM 8.7*   < > 8.6* 9.3 8.7*  PROT 6.5  --   --   --   --   BILITOT 0.5  --   --   --   --   ALKPHOS 44  --   --   --   --   ALT 13  --   --   --   --   AST 20  --   --   --   --   GLUCOSE 85   < > 94 86 86   < > = values in this interval not displayed.      Imaging/Diagnostic Tests: No results found.  [name], DO 06/12/2020, 7:01 AM PGY-1, [place] Intern pager: [phone], text pages welcome

## 2020-06-12 NOTE — TOC Progression Note (Signed)
Transition of Care [facility]) - Progression Note    Patient Details  Name: [name] MRN: [id number] Date of Birth: Mar 29, 1951  Transition of Care [facility]) CM/SW [place], [place] Phone Number: 06/12/2020, 8:23 AM  Clinical Narrative:      CSW received return call from pt step-daughter [name] [phone]. CSW informs her that pt is in hospital and lacks capacity. CSW requests that family assist with tx plan decisions. Step-daughter states that pt's wife does not have a phone but that she will notify his wife.    Expected Discharge Plan: Assisted Living Barriers to Discharge: Continued Medical Work up  Expected Discharge Plan and Services Expected Discharge Plan: Assisted Living       Living arrangements for the past 2 months: [place] ([place])                                       Social Determinants of Health (SDOH) Interventions    Readmission Risk Interventions No flowsheet data found.

## 2020-06-12 NOTE — Progress Notes (Addendum)
CSW called pt step daughter [place], [phone]; no answer, left voicemail requesting return call   1400: CSW called [facility] to inquire about APS report that was noted in pt chart from previous admission. APS case worker stated that there was no report or case on file with them.

## 2020-06-12 NOTE — Progress Notes (Signed)
Occupational Therapy Treatment Patient Details Name: [name] MRN: [id number] DOB: 04/05/1951 Today's Date: 06/12/2020    History of present illness Pt is a 69 y/o male admitted secondary to RLE swelling. Found to have RLE DVT and was started on lovenox. Pt with recent R foot fx and is s/p ORIF. PMH includes schizophrenia, HTN, tobacco use, and hep C.    OT comments  OT treatment session with focus on functional cognition, sequencing familiar tasks, and safety awareness, and self-care re-education. Patient very lethargic requiring increased coaxing/encouragement for participation. Per sitter present in room, patient requiring significantly more cueing this date. Patient requiring Min A and HHA for functional mobility to commode in bathroom and Min A +2 for toileting/hygiene/clothing management and hand washing in standing at sink level. Patient continues to benefit from acute OT services to maximize safety and independence with self-care tasks, decrease risk of falls, and decrease caregiver burden. Continued recommendation for SNF rehab given severity of patients cognitive deficits in safety, awareness, orientation, and memory.   Follow Up Recommendations  SNF;Supervision/Assistance - 24 hour    Equipment Recommendations  None recommended by OT    Recommendations for Other Services      Precautions / Restrictions Precautions Precautions: Fall Required Braces or Orthoses: Other Brace Other Brace: CAM boot RLE Restrictions Weight Bearing Restrictions: Yes RLE Weight Bearing: Weight bearing as tolerated Other Position/Activity Restrictions: in cam walker boot       Mobility Bed Mobility Overal bed mobility: Needs Assistance Bed Mobility: Supine to Sit;Sit to Supine     Supine to sit: Supervision Sit to supine: Min guard;HOB elevated   General bed mobility comments: Supervision A for supine to EOB. Patient attempting to return to supine head first requiring multimodal cues  for safety.   Transfers Overall transfer level: Needs assistance Equipment used: None Transfers: Sit to/from Stand Sit to Stand: Supervision (Hand held assist)         General transfer comment: HHA and heavy multimodal cues for safety    Balance Overall balance assessment: Needs assistance   Sitting balance-Leahy Scale: Good       Standing balance-Leahy Scale: Fair                             ADL either performed or assessed with clinical judgement   ADL       Grooming: Set up;Min guard;Standing Grooming Details (indicate cue type and reason): Heavy multimodal cues for attention to task and sequencing of familiar ADLs with frequent redirection.                  Toilet Transfer: Minimal Print production planner Details (indicate cue type and reason): Min A +2 for safety with heavy multimodal cues         Functional mobility during ADLs: Minimal assistance (HHA with cues for safety)       Vision       Perception     Praxis      Cognition Arousal/Alertness: Awake/alert Behavior During Therapy: Impulsive;Restless Overall Cognitive Status: History of cognitive impairments - at baseline                     Current Attention Level: Sustained Memory: Decreased short-term memory Following Commands: Follows one step commands with increased time Safety/Judgement: Decreased awareness of safety;Decreased awareness of deficits Awareness: Intellectual Problem Solving: Slow processing          Exercises  Shoulder Instructions       General Comments Per sitter present in room, patient requiring significantly more cueing this date.     Pertinent Vitals/ Pain       Pain Assessment: Faces Pain Score: 0-No pain Faces Pain Scale: No hurt  Home Living                                          Prior Functioning/Environment              Frequency  Min 2X/week        Progress Toward Goals  OT  Goals(current goals can now be found in the care plan section)  Progress towards OT goals: Progressing toward goals  Acute Rehab OT Goals Patient Stated Goal: No goals stated OT Goal Formulation: Patient unable to participate in goal setting Time For Goal Achievement: 06/19/20 Potential to Achieve Goals: Good ADL Goals Pt Will Perform Grooming: with supervision;standing Pt Will Perform Upper Body Dressing: with set-up;sitting Pt Will Perform Lower Body Dressing: with min assist;sitting/lateral leans;sit to/from stand Pt Will Transfer to Toilet: with supervision;ambulating;regular height toilet Pt Will Perform Toileting - Clothing Manipulation and hygiene: with min guard assist;sit to/from stand Additional ADL Goal #1: Pt will follow one step commands with min VC's Additional ADL Goal #2: Pt will reduce risk of falls by recalling 3/5 unsafe actions during ADL with mod VCs  Plan Discharge plan remains appropriate    Co-evaluation                 AM-PAC OT "6 Clicks" Daily Activity     Outcome Measure   Help from another person eating meals?: A Little Help from another person taking care of personal grooming?: A Little Help from another person toileting, which includes using toliet, bedpan, or urinal?: A Little Help from another person bathing (including washing, rinsing, drying)?: A Little Help from another person to put on and taking off regular upper body clothing?: A Little Help from another person to put on and taking off regular lower body clothing?: A Lot 6 Click Score: 17    End of Session Equipment Utilized During Treatment: Gait belt  OT Visit Diagnosis: Unsteadiness on feet (R26.81);Other abnormalities of gait and mobility (R26.89);Muscle weakness (generalized) (M62.81);Other symptoms and signs involving cognitive function   Activity Tolerance Patient tolerated treatment well   Patient Left in bed;with call bell/phone within reach;with bed alarm set;with  nursing/sitter in room   Nurse Communication          Time: 1000-1023 OT Time Calculation (min): 23 min  Charges: OT General Charges $OT Visit: 1 Visit OT Treatments $Self Care/Home Management : 23-37 mins  Kermit Arnette H. OTR/L Supplemental OT, Department of rehab services [phone]   Donzel Romack R H. 06/12/2020, 10:28 AM

## 2020-06-13 DIAGNOSIS — I10 Essential (primary) hypertension: Secondary | ICD-10-CM

## 2020-06-13 DIAGNOSIS — R4189 Other symptoms and signs involving cognitive functions and awareness: Secondary | ICD-10-CM | POA: Diagnosis not present

## 2020-06-13 DIAGNOSIS — I824Z9 Acute embolism and thrombosis of unspecified deep veins of unspecified distal lower extremity: Secondary | ICD-10-CM | POA: Diagnosis not present

## 2020-06-13 DIAGNOSIS — Z5900 Homelessness unspecified: Secondary | ICD-10-CM | POA: Diagnosis not present

## 2020-06-13 NOTE — Progress Notes (Addendum)
Family Medicine Teaching Service Daily Progress Note Intern Pager: [phone]  Patient name: [name] Medical record number: [id number] Date of birth: 1951-03-31 Age: 69 y.o. Gender: male  Primary Care Provider: Danna Hefty, DO Consultants: Neurology, psychiatry, vascular surgery  Code Status: Full  Pt Overview and Major Events to Date:  Admitted 10/19  Assessment and Plan: [name] a 69 y.o.malepresenting with RLE swelling and erythema + aphasia. PMH is significant for recent right foot crush injury s/p ORIF, primary schizophrenia, HFpEF,HTN, HLD, BPH, tobacco abuse, GERD, history of hep C s/p Harvoniandhistory of hep B cleared. Medically stable for discharge, dispo pending.  RLE DVT PatientwithRLE swelling and erythema found to havechronic DVT involving the right peroneal veins without a cystic structure found in the popliteal fossa on DVT study 10/18.Treatment withDOAC for 3-6 months per vascular surgery.Barrier to [name] is housing, he has been homeless and is being dismissed from [name], unsure of ability to take medication for 3-6 months. -rivaroxaban - PT/OT recs, pending safe disposition to SNF  Schizophrenia Stable.Presented withaphasia worse than baseline. Last known normal is unknown.Has history of schizophrenia with echolalia and mumbling speech at baseline. Patient is resident of [name] but has history of wandering off; last seen at facility on 10/15.[name] is planning to discharge him. - psychiatryfollowing, appreciateinvolvement - s/p Prolixin (25 mg q14d) injection(last given10/22) - benztropine 1 mg BID -to minimize disturbance contributing to agitation: labs to be drawn every 2-3 days, avoid am labs  -ramelteon 8 mg bedtime -sitter   Social needs Patient homeless, he left his shelter about a month ago and this is likely affecting his medication compliance. [facility] previously lived at [name] where he is known to wander  off, was last seen at the facility on 10/15.Reached out to SW again due to unable to get in contact with any family members and patient lacks capacity to make decisions such as COVID vaccination administration. Also concern that previous facility that patient came from was not receiving the appropriate supervision of medication administration which is an ongoing concern given that patient is receiving new anticoagulation on discharge. Difficult to provide anticoagulation education due to patient's current mental status. -SW consult, appreciate involvement and assistance in touching base with wife and SNF placement   Thrombocytopenia Chronic and improving.History of low plateletcount with baseline around 100-200.Most recent platelets149on 10/26. -Continue to monitorCBC  CKD IIIa Creatinine was1.36 on admission, baseline 1.3-1.4.Most recentCr 1.60and GFR 47 on 10/26. -Continue to monitor  HTN Most recent BP121/79.Home meds: Amlodipine 10mg  QD.Previously took losartan. -continue home Amlodipine  Normocytic Anemia Chronicandstable.Most recent Hgb 11.9 on 10/26.Hgb 11.3 on admission, baseline 10-11. No signs of acute bleed.Likely secondary to anemia of chronic disease from history of CKD. -Continue to monitor -Recommend outpatient colonoscopy and anemia workup -Continue folic acid1 mg daily  Right foot fracture H/o [name] fracture s/p ORIF by [name] on 9/14. Saw [name] for follow up visit on 10/14 who recommended to continue with fracture boot with weight bearing as tolerated and follow up with repeat imaging in 4 weeks. Foot x-ray on admission notable for healing fractures of the second through fifth metatarsal. No interval change from the prior study. -Continue fracture boot -PT/OT, OT recommends SNF   FEN/GI: regular diet  PPx: rivaroxaban    Disposition: medically stable, pending safe dispo to SNF       Subjective:  No acute events  reported overnight. Sitter present in the room.   Objective: Temp:  [98.1  F (36.7 C)-98.3 F (36.8 C)] 98.1 F (36.7 C) (10/27 2051) Pulse Rate:  [57-64] 64 (10/27 2051) Resp:  [16-20] 20 (10/27 2051) BP: (117-121)/(78-79) 121/79 (10/27 2051) SpO2:  [100 %] 100 % (10/27 2051) Physical Exam: General: Patient laying in bed comfortably, in no acute distress. Cardiovascular: RRR, no murmurs or gallops auscultated  Respiratory: lungs clear to auscultation bilaterally  Abdomen: soft, nontender, nondistended, presence of active bowel sounds  Extremities: left dorsalis pedis pulse intact, no LLE edema noted, right foot fracture boot in place  Neuro: AOx2  Psych: no agitation or aggression noted   Laboratory: Recent Labs  Lab 06/07/20 0039 06/08/20 0358 06/11/20 1514  WBC 3.8* 4.0 4.1  HGB 10.7* 12.0* 11.9*  HCT 32.6* 36.2* 35.8*  PLT 122* 120* 149*   Recent Labs  Lab 06/07/20 0039 06/08/20 0358 06/11/20 1514  NA 139 139 139  K 3.9 3.9 4.3  CL 105 102 104  CO2 23 29 26   BUN 7* 8 15  CREATININE 1.14 1.30* 1.60*  CALCIUM 8.6* 9.3 8.7*  GLUCOSE 94 86 86      Imaging/Diagnostic Tests: No results found.  [name], DO 06/13/2020, 6:20 AM PGY-1, [place] Intern pager: [phone], text pages welcome

## 2020-06-13 NOTE — Progress Notes (Signed)
Pt.s dinner has arrived. Pt sitting up and eating his dinner now. Will continue to monitor pt.

## 2020-06-13 NOTE — Progress Notes (Signed)
Pt.s wife has arrived to visit pt. Will continue to monitor pt.

## 2020-06-13 NOTE — TOC Progression Note (Addendum)
Transition of Care [facility]) - Progression Note    Patient Details  Name: [name] MRN: [id number] Date of Birth: Jan 20, 1951  [facility]) CM/SW [place], [place] Phone Number: 06/13/2020, 4:05 PM  Clinical Narrative:     CSW met with pt's wife and pt bedside. Wife is agreeable to snf work up with no preference. She also is okay with pt receiving covid vaccine in order to return to [facility] after SNF. Wife states she will be at beside tomorrow as well.   CSW completed fl2 and sent bed requests in the hub.   CSW called [facility], they will fax copy of pt's medicare card  Expected Discharge Plan: Assisted Living Barriers to Discharge: Continued Medical Work up  Expected Discharge Plan and Services Expected Discharge Plan: Assisted Living       Living arrangements for the past 2 months: [place] ([place])                                       Social Determinants of Health (SDOH) Interventions    Readmission Risk Interventions No flowsheet data found.

## 2020-06-13 NOTE — NC FL2 (Signed)
[place] LEVEL OF CARE SCREENING TOOL     IDENTIFICATION  Patient Name: CHAMPION CORALES Birthdate: 23-Nov-1950 Sex: male Admission Date (Current Location): 06/04/2020  [facility] and [place] Number:  Herbalist and Address:  [facility] [place], [place] [address], [place], [place] [address]      Provider Number: [id number]  Attending Physician Name and Address:  Lenoria Chime, MD  Relative Name and Phone Number:       Current Level of Care: Hospital Recommended Level of Care: [place] Prior Approval Number:    Date Approved/Denied:   PASRR Number: pending  Discharge Plan: Other (Comment) (SNF)    Current Diagnoses: Patient Active Problem List   Diagnosis Date Noted  . Cognitive impairment 06/12/2020  . AMS (altered mental status) 06/05/2020  . H/O noncompliance with medical treatment, presenting hazards to health   . DVT (deep venous thrombosis) ([place]) 06/04/2020  . Schizophrenia ([place])   . Homeless   . Dislocation of tarsometatarsal joint   . History of hepatitis B 04/26/2020  . Chronic hepatitis ([place])   . Dementia without behavioral disturbance ([place])   . Atrial fibrillation with RVR ([place]) 04/24/2020  . Malnutrition of moderate degree 04/24/2020  . History of drug abuse ([place]) 10/18/2017  . Tobacco abuse   . Diastolic heart failure ([place]) 02/05/2015  . Healthcare maintenance 05/28/2014  . Hyperlipidemia 03/10/2014  . [place] OCCLUSION 06/03/2010  . HYPERTENSION, BENIGN ESSENTIAL 01/17/2009  . BPH (benign prostatic hyperplasia) 01/17/2009  . History of hepatitis C 12/14/2008  . DEPRESSION 12/14/2008    Orientation RESPIRATION BLADDER Height & Weight     Self  Normal Continent Weight:   Height:     BEHAVIORAL SYMPTOMS/MOOD NEUROLOGICAL BOWEL NUTRITION STATUS  Wanderer   Continent Diet (see d/c summary)  AMBULATORY STATUS COMMUNICATION OF NEEDS Skin   Extensive Assist Verbally Surgical wounds                        Personal Care Assistance Level of Assistance  Bathing, Feeding, Dressing Bathing Assistance: Limited assistance Feeding assistance: Independent Dressing Assistance: Limited assistance     Functional Limitations Info  Sight, Hearing, Speech Sight Info: Impaired Hearing Info: Adequate Speech Info: Adequate    SPECIAL CARE FACTORS FREQUENCY  PT (By licensed PT), OT (By licensed OT)     PT Frequency: 5x/week OT Frequency: 5x/week            Contractures Contractures Info: Not present    Additional Factors Info  Code Status, Allergies Code Status Info: Full code Allergies Info: Penicillian, amoxicillian, ibuprophen, lisinopril           Current Medications (06/13/2020):  This is the current hospital active medication list Current Facility-Administered Medications  Medication Dose Route Frequency Provider Last Rate Last Admin  . acetaminophen (TYLENOL) tablet 650 mg  650 mg Oral Q6H PRN Mullis, Kiersten P, DO       Or  . acetaminophen (TYLENOL) suppository 650 mg  650 mg Rectal Q6H PRN Mullis, Kiersten P, DO      . amLODipine (NORVASC) tablet 10 mg  10 mg Oral Daily Mullis, Kiersten P, DO   10 mg at 06/13/20 1019  . benztropine (COGENTIN) tablet 1 mg  1 mg Oral BID Mullis, Kiersten P, DO   1 mg at 06/13/20 1019  . famotidine (PEPCID) tablet 20 mg  20 mg Oral BID Mullis, Kiersten P, DO  20 mg at 06/13/20 1019  . fluPHENAZine decanoate (PROLIXIN) injection 25 mg  25 mg Intramuscular Q14 Days Benay Pike, MD   25 mg at 06/07/20 1105  . folic acid (FOLVITE) tablet 1 mg  1 mg Oral Daily Mullis, Kiersten P, DO   1 mg at 06/13/20 1019  . ramelteon (ROZEREM) tablet 8 mg  8 mg Oral QHS Lattie Haw, MD   8 mg at 06/12/20 2210  . Rivaroxaban (XARELTO) tablet 15 mg  15 mg Oral BID Briant Cedar, MD   15 mg at 06/13/20 1019   Followed by  . [START ON 06/28/2020] rivaroxaban (XARELTO) tablet 20 mg  20 mg Oral Q supper Briant Cedar, MD       . tamsulosin [facility]) capsule 0.8 mg  0.8 mg Oral Daily Mullis, Kiersten P, DO   0.8 mg at 06/13/20 1019     Discharge Medications: Please see discharge summary for a list of discharge medications.  Relevant Imaging Results:  Relevant Lab Results:   Additional Information SSN: [id number]  Bethann Berkshire, LCSW

## 2020-06-13 NOTE — Progress Notes (Signed)
Physical Therapy Treatment Patient Details Name: [name] MRN: [id number] DOB: [birth year] Today's Date: 06/13/2020    History of Present Illness Pt is a 69 y/o male admitted secondary to RLE swelling. Found to have RLE DVT and was started on lovenox. Pt with recent R foot fx and is s/p ORIF. PMH includes schizophrenia, HTN, tobacco use, and hep C.     PT Comments    On arrival to room pt curled up on the lower half of the bed with sitter present. He is pleasant but agreeable to participate with therapy. Once sitting up pt became very focused on food tray and required frequent redirection to ambulate into hallway. Pt began ambulating to door, however quickly turned and went back to the food tray, again requiring redirection to continue with treatment. Once in hallway pt was able to focus on ambulating. He would do well with a shoe on the LLE to improve gait quality. Attempted gait with RW however pt seems very unsafe with RW and cannot follow commands for safety. Did better with HHA. Will continue to follow acutely for mobility progression. Current plan remains appropriate.       Follow Up Recommendations  SNF;Supervision/Assistance - 24 hour     Equipment Recommendations  None recommended by PT    Recommendations for Other Services       Precautions / Restrictions Precautions Precautions: Fall Required Braces or Orthoses: Other Brace Other Brace: CAM boot RLE Restrictions Weight Bearing Restrictions: Yes RLE Weight Bearing: Weight bearing as tolerated Other Position/Activity Restrictions: in cam walker boot    Mobility  Bed Mobility Overal bed mobility: Needs Assistance Bed Mobility: Supine to Sit     Supine to sit: Supervision     General bed mobility comments: supervision for safety as pt is impulsive.  Transfers Overall transfer level: Needs assistance Equipment used: None Transfers: Sit to/from Stand Sit to Stand: Supervision         General transfer  comment: Supervision for safety.   Ambulation/Gait Ambulation/Gait assistance: Min assist Gait Distance (Feet): 120 Feet Assistive device: Rolling walker (2 wheeled);1 person hand held assist Gait Pattern/deviations: Decreased stride length;Step-to pattern Gait velocity: Decreased   General Gait Details: Slow, step to gait with antalgic like pattern due to CAM boot on RLE and leg length discrepancy.  Initially attempted gait with RW however pt unable to follow commands for safety with RW. He did better with HHA.     Stairs             Wheelchair Mobility    Modified Rankin (Stroke Patients Only)       Balance Overall balance assessment: Needs assistance Sitting-balance support: Feet supported;No upper extremity supported Sitting balance-Leahy Scale: Good     Standing balance support: During functional activity Standing balance-Leahy Scale: Fair Standing balance comment: Reliant on at least 1 UE support, close Min guard. Pt stopping at counter and looking through personal belongings bag and placing things in there without LOB.                            Cognition Arousal/Alertness: Awake/alert Behavior During Therapy: Impulsive;Restless Overall Cognitive Status: History of cognitive impairments - at baseline Area of Impairment: Attention;Following commands;Memory;Safety/judgement;Awareness;Problem solving;Orientation                 Orientation Level: Situation;Time;Place;Disoriented to Current Attention Level: Sustained Memory: Decreased short-term memory Following Commands: Follows one step commands with increased time;Follows one step  commands inconsistently Safety/Judgement: Decreased awareness of safety;Decreased awareness of deficits Awareness: Intellectual Problem Solving: Slow processing General Comments: Overall pleasent but difficult to understand at times. Pt requires frequent redirection to stay on task. Marked deficits in awareness,  safety and ability to appropriately take care of self safely.      Exercises      General Comments General comments (skin integrity, edema, etc.): sitter present for session      Pertinent Vitals/Pain Pain Assessment: Faces Faces Pain Scale: No hurt    Home Living                      Prior Function            PT Goals (current goals can now be found in the care plan section) Acute Rehab PT Goals Patient Stated Goal: No goals stated PT Goal Formulation: Patient unable to participate in goal setting Time For Goal Achievement: 06/19/20 Potential to Achieve Goals: Fair Progress towards PT goals: Progressing toward goals    Frequency    Min 2X/week      PT Plan Current plan remains appropriate    Co-evaluation              AM-PAC PT "6 Clicks" Mobility   Outcome Measure  Help needed turning from your back to your side while in a flat bed without using bedrails?: A Little Help needed moving from lying on your back to sitting on the side of a flat bed without using bedrails?: A Little Help needed moving to and from a bed to a chair (including a wheelchair)?: A Little Help needed standing up from a chair using your arms (e.g., wheelchair or bedside chair)?: A Little Help needed to walk in hospital room?: A Little Help needed climbing 3-5 steps with a railing? : A Lot 6 Click Score: 17    End of Session Equipment Utilized During Treatment: Gait belt Activity Tolerance: Patient tolerated treatment well Patient left: with call bell/phone within reach;in chair;with nursing/sitter in room Nurse Communication: Mobility status PT Visit Diagnosis: Unsteadiness on feet (R26.81);Muscle weakness (generalized) (M62.81);Other symptoms and signs involving the nervous system (R29.898) Pain - Right/Left: Right Pain - part of body: Ankle and joints of foot     Time: 1141-1200 PT Time Calculation (min) (ACUTE ONLY): 19 min  Charges:  $Gait Training: 8-22  mins                     Benjiman Core, [place] Pager [phone] Acute Rehab   Allena Katz 06/13/2020, 12:13 PM

## 2020-06-13 NOTE — TOC Progression Note (Addendum)
Transition of Care [facility]) - Progression Note    Patient Details  Name: [name] MRN: [id number] Date of Birth: 10-Jan-1951  Transition of Care [facility]) CM/SW [place], [place] Phone Number: 06/13/2020, 1:31 PM  Clinical Narrative:    CSW called pt step daughter; no answer left voicemail.   CSW contacted [employer] non emergency line and requested wellness check for pt wife at [place] [phone]. [employer] to follow up with CSW  1400: CSW received call from [facility] notifying that they were able to reach pt's wife at her address. Stated she did not know he was in the hospital. She does  Not have a phone but plans to visit later in day.    Expected Discharge Plan: Assisted Living Barriers to Discharge: Continued Medical Work up  Expected Discharge Plan and Services Expected Discharge Plan: Assisted Living       Living arrangements for the past 2 months: [place] ([place])                                       Social Determinants of Health (SDOH) Interventions    Readmission Risk Interventions No flowsheet data found.

## 2020-06-14 ENCOUNTER — Inpatient Hospital Stay: Payer: Medicare Other

## 2020-06-14 DIAGNOSIS — N400 Enlarged prostate without lower urinary tract symptoms: Secondary | ICD-10-CM | POA: Diagnosis not present

## 2020-06-14 DIAGNOSIS — I824Z9 Acute embolism and thrombosis of unspecified deep veins of unspecified distal lower extremity: Secondary | ICD-10-CM | POA: Diagnosis not present

## 2020-06-14 DIAGNOSIS — I10 Essential (primary) hypertension: Secondary | ICD-10-CM | POA: Diagnosis not present

## 2020-06-14 LAB — CBC WITH DIFFERENTIAL/PLATELET
Abs Immature Granulocytes: 0 10*3/uL (ref 0.00–0.07)
Basophils Absolute: 0 10*3/uL (ref 0.0–0.1)
Basophils Relative: 1 %
Eosinophils Absolute: 0.1 10*3/uL (ref 0.0–0.5)
Eosinophils Relative: 3 %
HCT: 36.1 % — ABNORMAL LOW (ref 39.0–52.0)
Hemoglobin: 11.8 g/dL — ABNORMAL LOW (ref 13.0–17.0)
Immature Granulocytes: 0 %
Lymphocytes Relative: 29 %
Lymphs Abs: 1.4 10*3/uL (ref 0.7–4.0)
MCH: 29.9 pg (ref 26.0–34.0)
MCHC: 32.7 g/dL (ref 30.0–36.0)
MCV: 91.6 fL (ref 80.0–100.0)
Monocytes Absolute: 0.5 10*3/uL (ref 0.1–1.0)
Monocytes Relative: 10 %
Neutro Abs: 2.7 10*3/uL (ref 1.7–7.7)
Neutrophils Relative %: 57 %
Platelets: 150 10*3/uL (ref 150–400)
RBC: 3.94 MIL/uL — ABNORMAL LOW (ref 4.22–5.81)
RDW: 13.7 % (ref 11.5–15.5)
WBC: 4.8 10*3/uL (ref 4.0–10.5)
nRBC: 0 % (ref 0.0–0.2)

## 2020-06-14 LAB — BASIC METABOLIC PANEL
Anion gap: 8 (ref 5–15)
BUN: 17 mg/dL (ref 8–23)
CO2: 28 mmol/L (ref 22–32)
Calcium: 8.9 mg/dL (ref 8.9–10.3)
Chloride: 101 mmol/L (ref 98–111)
Creatinine, Ser: 1.38 mg/dL — ABNORMAL HIGH (ref 0.61–1.24)
GFR, Estimated: 56 mL/min — ABNORMAL LOW (ref >60)
Glucose, Bld: 109 mg/dL — ABNORMAL HIGH (ref 70–99)
Potassium: 4.1 mmol/L (ref 3.5–5.1)
Sodium: 137 mmol/L (ref 135–145)

## 2020-06-14 NOTE — TOC Progression Note (Addendum)
Transition of Care [facility]) - Progression Note    Patient Details  Name: [name] MRN: [id number] Date of Birth: 1951/04/14  Transition of Care [facility]) CM/SW [place], [place] Phone Number: 06/14/2020, 1:55 PM  Clinical Narrative:     CSW contacted Dedra at [facility] to inquire about acceptance in hub. Upon reviewing insurance, [facility] is unable to extend a bed offer.   CSW expanded bed search to [place], [place], [place], [place] areas.   Attempted to meet with wife bedside but she has not visited today.   Expected Discharge Plan: Assisted Living Barriers to Discharge: Continued Medical Work up  Expected Discharge Plan and Services Expected Discharge Plan: Assisted Living       Living arrangements for the past 2 months: [place] ([place])                                       Social Determinants of Health (SDOH) Interventions    Readmission Risk Interventions No flowsheet data found.

## 2020-06-14 NOTE — Progress Notes (Addendum)
FPTS Interim Progress Note  S: Family medicine team notified regarding patient getting agitated. When I arrived, patient is sitting upright in bed holding the phone. He has been awaiting his wife to come visit him, she was last here yesterday per front desk. Nurse states that earlier patient was trying to get out of bed and walk out the door, she eventually tried to get him back in the room. Sitter present. Wife has yet to arrive today. Stayed at bedside with the patient attempting to try to provide further redirection, patient remained calm well before I left him room.   O: BP (!) 141/80 (BP Location: Right Arm)   Pulse 74   Temp 97.9 F (36.6 C) (Oral)   Resp 18   SpO2 94%   General: Patient sitting upright in bed, phone in hand, in no acute distress. Resp: no labored breathing noted Neuro: AOx1, mumbling and incoherent speech with intermittent coherent speech  Psych: anxious appearing, no agitation noted   A/P: -continue redirection -continue sitter -also notified by SW, in person, that patient is unable to get bed for SNF at this time due to patient's current Medicaid status, appreciate continued involvement and assistance  -continue current plan   [name], DO 06/14/2020, 2:27 PM PGY-1, [place] Medicine Service pager [phone]

## 2020-06-14 NOTE — Progress Notes (Signed)
RE:  [name]       Date of Birth:  October 11, 1950     Date:   06/14/2020       To Whom It May Concern:  Please be advised that the above-named patient will require a short-term nursing home stay - anticipated 30 days or less for rehabilitation and strengthening.  The plan is for return home.

## 2020-06-14 NOTE — Progress Notes (Signed)
Family Medicine Teaching Service Daily Progress Note Intern Pager: [phone]  Patient name: [name] Medical record number: [id number] Date of birth: 19-Jun-1951 Age: 69 y.o. Gender: male  Primary Care Provider: Danna Hefty, DO Consultants: Neurology, psychiatry, vascular surgery  Code Status: Full   Pt Overview and Major Events to Date:  Admitted 10/19  Assessment and Plan: [name] a 69 y.o.malepresenting with RLE swelling and erythema + aphasia. PMH is significant for recent right foot crush injury s/p ORIF, primary schizophrenia, HFpEF,HTN, HLD, BPH, tobacco abuse, GERD, history of hep C s/p Harvoniandhistory of hep B cleared. Medically stable for discharge, dispo pending.   RLE DVT PatientwithRLE swelling and erythema found to havechronic DVT involving the right peroneal veins without a cystic structure found in the popliteal fossa on DVT study 10/18.Treatment withDOAC for 3-6 months per vascular surgery.Barrier to [place] is housing, he has been homeless and is being dismissed from [place], unsure of ability to take medication for 3-6 months. -rivaroxaban - PT/OT recs, pending safe disposition to SNF  Schizophrenia Stable.History of schizophrenia with echolalia and mumbling speech at baseline. Patient is resident of [facility] but has history of wandering off; last seen at facility on 10/15.[name] him. - psychiatryfollowing, appreciateinvolvement - s/p Prolixin (25 mg q14d) injection(last given10/22), next dose 11/5 - benztropine 1 mg BID -to minimize disturbance contributing to agitation: labs to be drawn every 2-3 days, avoid am labs -ramelteon 8 mg bedtime -sitter present this morning  Social needs Patient homeless, he left his shelter about a month ago and this is likely affecting his medication compliance. Patient previously lived at [facility] where he is known to wander off, was last seen at the facility on  10/15.Reached out to SW again due to unable to get in contact with any family members and patient lacks capacity to make decisions such as COVID vaccination administration. Also concern that previous facility that patient came from was not receiving the appropriate supervision of medication administration which is an ongoing concern given that patient is receiving new anticoagulation on discharge. Difficult to provide anticoagulation education due to patient's current mental status. -SW consult, greatly appreciate involvement and assistancein touching base with wife and SNF placement. Per SW, wife is agreeable to administer COVID vaccination if needed and agrees to SNF placement as well. Wife expected to arrive sometime today. Per prior SW note, patient's wife came by to visit yesterday.   Thrombocytopenia Chronic and improving.History of low plateletcount with baseline around 100-200.Most recent platelets149on 10/26. -Continue to monitorCBC -awaiting noon labs   CKD IIIa Creatinine was1.36 on admission, baseline 1.3-1.4.Most recentCr 1.60and GFR47on 10/26. -Continue to monitor -awaiting noon labs   HTN Most recent BP122/61.Home meds: Amlodipine 10mg  QD.Previously took losartan. -continue home Amlodipine  Normocytic Anemia Chronicandstable.Most recent Hgb 11.9on 10/26.Hgb 11.3 on admission, baseline 10-11. No signs of acute bleed.Likely secondary to anemia of chronic disease from history of CKD. -Continue to monitor -Recommend outpatient colonoscopy and anemia workup -Continue folic acid1 mg daily -awaiting noon labs   Right foot fracture H/o Lisfranc fracture s/p ORIF by [name] on 9/14. Saw [name] for follow up visit on 10/14 who recommended to continue with fracture boot with weight bearing as tolerated and follow up with repeat imaging in 4 weeks. Foot x-ray on admission notable for healing fractures of the second through fifth metatarsal. No  interval change from the prior study. -Continue fracture boot -PT/OT, OT recommends SNF, pending placement  FEN/GI: regular diet  PPx: rivaroxaban    Status is: Inpatient  Disposition: medically stable, pending safe dispo to SNF        Subjective:  Sitter not present overnight but present this morning. Patient excited that his wife, [name], will be coming today. Informed nurse to notify our team when she arrives.   Objective: Temp:  [97.9 F (36.6 C)-98.6 F (37 C)] 97.9 F (36.6 C) (10/29 0543) Pulse Rate:  [60-62] 60 (10/29 0543) Resp:  [18] 18 (10/29 0543) BP: (122-125)/(60-61) 122/61 (10/29 0543) SpO2:  [100 %] 100 % (10/29 0543) Physical Exam: General: Patient sitting upright in bed, ate breakfast, in no acute distress. Cardiovascular: RRR, no murmurs or gallops noted  Respiratory: lungs clear to auscultation bilaterally Abdomen: soft, nontender, presence of active bowel sounds Extremities: radial pulses strong and equal bilaterally, right foot fracture boot in place, left dorsalis pedis pulse intact, no LLE edema noted Neuro: AOx1, more coherent speech but still mumbling at times Psych: excited upon awaiting wife's arrival   Laboratory: Recent Labs  Lab 06/08/20 0358 06/11/20 1514  WBC 4.0 4.1  HGB 12.0* 11.9*  HCT 36.2* 35.8*  PLT 120* 149*   Recent Labs  Lab 06/08/20 0358 06/11/20 1514  NA 139 139  K 3.9 4.3  CL 102 104  CO2 29 26  BUN 8 15  CREATININE 1.30* 1.60*  CALCIUM 9.3 8.7*  GLUCOSE 86 86      Imaging/Diagnostic Tests: No results found.  [name], DO 06/14/2020, 6:33 AM PGY-1, [place] Intern pager: [phone], text pages welcome

## 2020-06-15 DIAGNOSIS — I824Z9 Acute embolism and thrombosis of unspecified deep veins of unspecified distal lower extremity: Secondary | ICD-10-CM | POA: Diagnosis not present

## 2020-06-15 NOTE — Progress Notes (Addendum)
Family Medicine Teaching Service Daily Progress Note Intern Pager: [phone]  Patient name: [name] Medical record number: [id number] Date of birth: 10-Nov-1950 Age: 69 y.o. Gender: male  Primary Care Provider: Danna Hefty, DO Consultants: Neurology, psychiatry, vascular surgery  Code Status: Full  Pt Overview and Major Events to Date:  Admitted 10/19  Assessment and Plan: [name] a 69 y.o.malepresenting with RLE swelling and erythema + aphasia. PMH is significant for recent right foot crush injury s/p ORIF, primary schizophrenia, HFpEF,HTN, HLD, BPH, tobacco abuse, GERD, history of hep C s/p Harvoniandhistory of hep B cleared. Medically stable for discharge, dispo pending.   RLE DVT PatientwithRLE swelling and erythema found to havechronic DVT involving the right peroneal veins without a cystic structure found in the popliteal fossa on DVT study 10/18.Treatment withDOAC for 3-6 months per vascular surgery.Barrier to [place] is housing, he has been homeless and is being dismissed from [place], unsure of ability to take medication for 3-6 months. -rivaroxaban - PT/OT recs, pending safe disposition to SNF  Schizophrenia Stable.History of schizophrenia with echolalia and mumbling speech at baseline. Patient is resident of [place] but has history of wandering off; last seen at facility on 10/15.[name] him. - psychiatryfollowing, appreciateinvolvement - s/p Prolixin (25 mg q14d) injection(last given10/22), next dose 11/5 - benztropine 1 mg BID -to minimize disturbance contributing to agitation: labs to be drawn every 2-3 days, avoid am labs -ramelteon 8 mg bedtime -sitter present, continue    Social needs Patient previously homeless, he left his shelter about a month ago and this is likely affecting his medication compliance. Patient previously lived at [facility] where he is known to wander off, was last seen at the  facility on 10/15.Reached out to SW again due to unable to get in contact with any family members and patient lacks capacity to make decisions such as COVID vaccination administration. Also concern that previous facility that patient came from was not receiving the appropriate supervision of medication administration which is an ongoing concern given that patient is receiving new anticoagulation on discharge. Difficult to provide anticoagulation education due to patient's current mental status. -SW consult, greatly appreciate involvement and assistancein touching base with wife and SNF placement. Per SW, wife is agreeable to administer COVID vaccination if needed and agrees to SNF placement as well. Wife expected to arrive sometime today. Per prior SW note, patient's wife came by to visit yesterday.  -pending COVID vaccine, attempted yesterday but notified by nurse that patient refused in spite of lacking capacity and getting prior consent from wife  Thrombocytopenia Chronic and likely resolved.History of low plateletcount with baseline around 100-200.Most recent platelets150on 10/28. -Continue to monitorCBC  CKD IIIa Creatinine was1.36 on admission, baseline 1.3-1.4.Most recentCr 1.38and GFR56on 10/29. -Continue to monitor  HTN Normotensive this morning with BP132/64.Home meds: Amlodipine 10mg  QD.Previously took losartan. -continue home Amlodipine  Normocytic Anemia Chronicandstable.Most recent Hgb 11.8on 10/29.Hgb 11.3 on admission, baseline 10-11. No signs of acute bleed.Likely secondary to anemia of chronic disease from history of CKD. -Continue to monitor -Recommend outpatient colonoscopy and anemia workup -Continue folic acid1 mg daily  Right foot fracture H/o Lisfranc fracture s/p ORIF by [name] on 9/14. Saw [name] for follow up visit on 10/14 who recommended to continue with fracture boot with weight bearing as tolerated and follow up with  repeat imaging in 4 weeks. Foot x-ray on admission notable for healing fractures of the second through fifth metatarsal. No interval  change from the prior study. -Continue fracture boot -PT/OT, OT recommends SNF, pending placement    FEN/GI: regular diet  PPx: rivaroxaban   Status is: Inpatient  Disposition: medically stable, pending SNF placement        Subjective:  No acute events reported overnight. Sitter present, reports that patient continues to call for his wife who has yet to come visit since 10/28. Nurse is to page family med team when wife arrives. Patient eating well per sitter.   Objective: Temp:  [97.6 F (36.4 C)-98.6 F (37 C)] 97.6 F (36.4 C) (10/30 0731) Pulse Rate:  [70-80] 70 (10/30 0731) Resp:  [18-20] 20 (10/30 0731) BP: (118-141)/(64-80) 132/64 (10/30 0731) SpO2:  [94 %-100 %] 100 % (10/30 0731) Physical Exam: General: Patient sleeping comfortably in bed, in no acute distress. Cardiovascular: RRR, no murmurs or gallops auscultated  Respiratory: lungs clear to auscultation bilaterally  Abdomen: soft, nontender, presence of active bowel sounds Extremities: right fracture boot in place, no LLE edema noted, left dorsalis pedis pulse intact  Laboratory: Recent Labs  Lab 06/11/20 1514 06/14/20 1530  WBC 4.1 4.8  HGB 11.9* 11.8*  HCT 35.8* 36.1*  PLT 149* 150   Recent Labs  Lab 06/11/20 1514 06/14/20 1530  NA 139 137  K 4.3 4.1  CL 104 101  CO2 26 28  BUN 15 17  CREATININE 1.60* 1.38*  CALCIUM 8.7* 8.9  GLUCOSE 86 109*      Imaging/Diagnostic Tests: No results found.  [name], DO 06/15/2020, 7:38 AM PGY-1, [place] Intern pager: [phone], text pages welcome
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
## 2020-06-16 DIAGNOSIS — G9340 Encephalopathy, unspecified: Secondary | ICD-10-CM

## 2020-06-16 DIAGNOSIS — I824Z9 Acute embolism and thrombosis of unspecified deep veins of unspecified distal lower extremity: Secondary | ICD-10-CM | POA: Diagnosis not present

## 2020-06-16 NOTE — Progress Notes (Signed)
Family Medicine Teaching Service Daily Progress Note Intern Pager: [phone]  Patient name: [name] Medical record number: [id number] Date of birth: 07-19-1951 Age: 69 y.o. Gender: male  Primary Care Provider: Danna Hefty, DO Consultants: neurology, psychiatry, vascular surgery  Code Status: full  Pt Overview and Major Events to Date:  Admitted 10/19  Assessment and Plan: [name] [name] a 69 y.o.malepresenting with RLE swelling and erythema + aphasia. PMH is significant for recent right foot crush injury s/p ORIF, primary schizophrenia, HFpEF,HTN, HLD, BPH, tobacco abuse, GERD, history of hep C s/p Harvoniandhistory of hep B cleared. Medically stable for discharge, dispo pending.   RLE DVT PatientwithRLE swelling and erythema found to havechronic DVT involving the right peroneal veins without a cystic structure found in the popliteal fossa on DVT study 10/18.Treatment withDOAC for 3-6 months per vascular surgery. -rivaroxaban 15mg  BID; transition to 20mg  qd on 06/28/20 - PT/OT recs, pending safe disposition to SNF  Schizophrenia Stable.History of schizophrenia with echolalia and mumbling speech at baseline. Patient is resident of [facility] but has history of wandering off; last seen at facility on 10/15.Patient awaiting transfer to SNF then back to [facility], pending availability and COVID vaccine administration.  - psychiatryconsulted, no changes recommended.  - s/p Prolixin (25 mg q14d) injection(last given10/22), next dose 11/5 - benztropine 1 mg BID -to minimize disturbance contributing to agitation: labs to be drawn every 2-3 days, avoid am labs -ramelteon 8 mg bedtime -sitterpresent, continue sitter pending availability   Social needs Patient previously homeless, recently staying at [facility], he leaves his shelter frequently and this is likely affecting his medication compliance. Pt lacks capacity and surrogate decision makers have been  difficult to contact. May need to be in a living facility with a higher level of supervision, but placement to any facility has been difficult. Current plans are for pt to return to SNF then [place] consult,greatlyappreciate involvement and assistancein touching base with wife and SNF placement. Per SW, wife is agreeable to administer COVID vaccination if needed and agrees to SNF placement as well.Wife expected to arrive sometime today. Per prior SW note, patient's wife came by to visit yesterday. -pending COVID vaccine, attempted yesterday but notified by nurse that patient refused in spite of lacking capacity and getting prior consent from wife  Thrombocytopenia Chronic and likely resolved.History of low plateletcount with baseline around 100-200.Most recent platelets150on 10/28. -Continue to monitorCBC periodically  CKD IIIa Creatinine was1.36 on admission, baseline 1.3-1.4.Most recentCr 1.38and GFR56on 10/29. -Continue to monitor periodically.   HTN Normotensive this morning with BP117/77Home meds: Amlodipine 10mg  QD.Previously took losartan. -continue home Amlodipine  Normocytic Anemia Chronicandstable.Most recent Hgb 11.8on 10/29.Hgb 11.3 on admission, baseline 10-11. No signs of acute bleed.Likely secondary to anemia of chronic disease from history of CKD. -Continue to monitor -Recommend outpatient colonoscopy and anemia workup -Continue folic acid1 mg daily  Right foot fracture H/o Lisfranc fracture s/p ORIF by [name] on 9/14. Saw [name] for follow up visit on 10/14 who recommended to continue with fracture boot with weight bearing as tolerated and follow up with repeat imaging in 4 weeks. Foot x-ray on admission notable for healing fractures of the second through fifth metatarsal. No interval change from the prior study. -Continue fracture boot -PT/OT, OT recommends SNF, pending placement   FEN/GI: regular diet  PPx:  rivaroxaban  Disposition: SNF>ALF  Subjective:  Pt states he 'feels good' today.  There was difficulty understanding what was said beyond that.  Pt often repeated what I was saying to him.    Objective: Temp:  [97.6 F (36.4 C)-98.5 F (36.9 C)] 98.5 F (36.9 C) (10/30 2052) Pulse Rate:  [69-70] 69 (10/30 2052) Resp:  [18-20] 18 (10/30 2052) BP: (117-132)/(64-77) 117/77 (10/30 2052) SpO2:  [99 %-100 %] 99 % (10/30 2052) Weight:  [64 kg] 64 kg (10/30 1440) Physical Exam: General: alert.  Sitting in bed.  No acute distress.  Cardiovascular: RRR. No murmurs.  Respiratory: LCTAB.  Abdomen: soft, nontender.  Extremities: right foot in hard boot.   Laboratory: Recent Labs  Lab 06/11/20 1514 06/14/20 1530  WBC 4.1 4.8  HGB 11.9* 11.8*  HCT 35.8* 36.1*  PLT 149* 150   Recent Labs  Lab 06/11/20 1514 06/14/20 1530  NA 139 137  K 4.3 4.1  CL 104 101  CO2 26 28  BUN 15 17  CREATININE 1.60* 1.38*  CALCIUM 8.7* 8.9  GLUCOSE 86 109*      Imaging/Diagnostic Tests:   Benay Pike, MD 06/16/2020, 7:18 AM PGY-3, [place] Intern pager: [phone], text pages welcome

## 2020-06-16 NOTE — Progress Notes (Signed)
Pt remains a high risk for elopement. 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

## 2020-06-17 ENCOUNTER — Ambulatory Visit (HOSPITAL_COMMUNITY): Payer: Self-pay

## 2020-06-17 DIAGNOSIS — M7989 Other specified soft tissue disorders: Secondary | ICD-10-CM | POA: Diagnosis not present

## 2020-06-17 DIAGNOSIS — I824Z9 Acute embolism and thrombosis of unspecified deep veins of unspecified distal lower extremity: Secondary | ICD-10-CM | POA: Diagnosis not present

## 2020-06-17 LAB — CBC WITH DIFFERENTIAL/PLATELET
Abs Immature Granulocytes: 0.01 10*3/uL (ref 0.00–0.07)
Basophils Absolute: 0 10*3/uL (ref 0.0–0.1)
Basophils Relative: 1 %
Eosinophils Absolute: 0.1 10*3/uL (ref 0.0–0.5)
Eosinophils Relative: 3 %
HCT: 35.4 % — ABNORMAL LOW (ref 39.0–52.0)
Hemoglobin: 11.7 g/dL — ABNORMAL LOW (ref 13.0–17.0)
Immature Granulocytes: 0 %
Lymphocytes Relative: 42 %
Lymphs Abs: 1.8 10*3/uL (ref 0.7–4.0)
MCH: 29.8 pg (ref 26.0–34.0)
MCHC: 33.1 g/dL (ref 30.0–36.0)
MCV: 90.1 fL (ref 80.0–100.0)
Monocytes Absolute: 0.4 10*3/uL (ref 0.1–1.0)
Monocytes Relative: 9 %
Neutro Abs: 1.9 10*3/uL (ref 1.7–7.7)
Neutrophils Relative %: 45 %
Platelets: 152 10*3/uL (ref 150–400)
RBC: 3.93 MIL/uL — ABNORMAL LOW (ref 4.22–5.81)
RDW: 13.9 % (ref 11.5–15.5)
WBC: 4.2 10*3/uL (ref 4.0–10.5)
nRBC: 0 % (ref 0.0–0.2)

## 2020-06-17 LAB — BASIC METABOLIC PANEL
Anion gap: 9 (ref 5–15)
BUN: 22 mg/dL (ref 8–23)
CO2: 28 mmol/L (ref 22–32)
Calcium: 9.1 mg/dL (ref 8.9–10.3)
Chloride: 104 mmol/L (ref 98–111)
Creatinine, Ser: 1.44 mg/dL — ABNORMAL HIGH (ref 0.61–1.24)
GFR, Estimated: 53 mL/min — ABNORMAL LOW (ref >60)
Glucose, Bld: 82 mg/dL (ref 70–99)
Potassium: 4 mmol/L (ref 3.5–5.1)
Sodium: 141 mmol/L (ref 135–145)

## 2020-06-17 NOTE — Progress Notes (Addendum)
Family Medicine Teaching Service Daily Progress Note Intern Pager: [phone]  Patient name: [name] Medical record number: [id number] Date of birth: 1951/03/25 Age: 69 y.o. Gender: male  Primary Care Provider: Danna Hefty, DO Consultants: Neurology, psychiatry, vascular surgery Code Status: Full  Pt Overview and Major Events to Date:  Admitted 10/19  Assessment and Plan: Imran Nuon Iveryis a 69 y.o.malepresenting with RLE swelling and erythema + aphasia. PMH is significant for recent right foot crush injury s/p ORIF, primary schizophrenia, HFpEF,HTN, HLD, BPH, tobacco abuse, GERD, history of hep C s/p Harvoniandhistory of hep B cleared. Medically stable for discharge, dispo pending.   RLE DVT PatientwithRLE swelling and erythema found to havechronic DVT involving the right peroneal veins without a cystic structure found in the popliteal fossa on DVT study 10/18.Treatment withDOAC for 3-6 months per vascular surgery. -rivaroxaban 15mg  BID; transition to 20mg  qd on 06/28/20 - PT/OT recs, pending safe disposition to SNF  Schizophrenia Stable.History of schizophrenia with echolalia and mumbling speech at baseline. Patient is resident of [facility] [place] but has history of wandering off; last seen at facility on 10/15. - psychiatryconsulted, no changes recommended.  - s/p Prolixin (25 mg q14d) injection(last given10/22), next dose 11/5 - benztropine 1 mg BID -to minimize disturbance contributing to agitation: labs to be drawn every 2-3 days, avoid am labs -ramelteon 8 mg bedtime -sitter not present due to staff shortage, requested sitter when able   Social needs Patientpreviouslyhomeless, recently staying at [facility], he leaves his shelter frequently and this is likely affecting his medication compliance. Pt lacks capacity and surrogate decision makers have been difficult to contact. May need to be in a living facility with a higher level of supervision, but  placement to any facility has been difficult. Current plans are for pt to return to SNF then [place] consult,greatlyappreciate involvement and assistancein touching base with wife and SNF placement. Per SW, wife is agreeable to administer COVID vaccination if needed and agrees to SNF placement as well.Reached out to SW regarding any further updates, appreciate continued assistance.   -pending COVID vaccine, previously attempted but notified by nurse that patient refused in spite of lacking capacity and getting prior verbal consent from wife, acquire written consent from wife when she visits   Thrombocytopenia Chronic andlikely resolved.History of low plateletcount with baseline around 100-200.Most recent platelets150on 10/28. -pending noon labs -Continue to monitorCBC periodically  CKD IIIa Creatinine was1.36 on admission, baseline 1.3-1.4.Most recentCr 1.38and GFR56on 10/29. -pending noon labs -Continue to monitor periodically.   HTN BP this morning 117/72. Home meds: Amlodipine 10mg  QD.Previously took losartan. -continue home Amlodipine  Normocytic Anemia Chronicandstable.Most recent Hgb 11.8on 10/29.Hgb 11.3 on admission, baseline 10-11. No signs of acute bleed.Likely secondary to anemia of chronic disease from history of CKD. -pending noon labs -Continue to monitor -Recommend outpatient colonoscopy and anemia workup -Continue folic acid1 mg daily   Right foot fracture H/o Lisfranc fracture s/p ORIF by [name] on 9/14. Saw [name] for follow up visit on 10/14 who recommended to continue with fracture boot with weight bearing as tolerated and follow up with repeat imaging in 4 weeks. Foot x-ray on admission notable for healing fractures of the second through fifth metatarsal. No interval change from the prior study. -Continue fracture boot -PT/OT, OT recommends SNF, pending placement   FEN/GI: regular diet  PPx:  rivaroxaban   Status is: Inpatient  Disposition: medically stable for discharge, pending SNF placement  Subjective:  No acute overnight events reported. Patient asleep throughout exam. Notified front desk to inform family med team if/when wife comes to visit to get written consent for COVID vaccination.   Objective: Temp:  [98.4 F (36.9 C)] 98.4 F (36.9 C) (11/01 0639) Pulse Rate:  [68] 68 (11/01 0639) Resp:  [15] 15 (11/01 0639) BP: (117)/(72) 117/72 (11/01 0639) SpO2:  [98 %] 98 % (11/01 6381) Physical Exam: General: Patient resting comfortably in bed, in no acute distress. Cardiovascular: RRR, no murmurs auscultated  Respiratory: lungs clear to auscultation bilaterally Abdomen: soft, nontender, presence of active bowel sounds  Extremities: no LLE edema noted, right fracture boot in place, dorsalis pedis pulse intact on the left Neuro: easily arousable to voice   Laboratory: Recent Labs  Lab 06/11/20 1514 06/14/20 1530 06/17/20 0751  WBC 4.1 4.8 4.2  HGB 11.9* 11.8* 11.7*  HCT 35.8* 36.1* 35.4*  PLT 149* 150 152   Recent Labs  Lab 06/11/20 1514 06/14/20 1530 06/17/20 0751  NA 139 137 141  K 4.3 4.1 4.0  CL 104 101 104  CO2 26 28 28   BUN 15 17 22   CREATININE 1.60* 1.38* 1.44*  CALCIUM 8.7* 8.9 9.1  GLUCOSE 86 109* 82      Imaging/Diagnostic Tests: No results found. [name], DO 06/17/2020, 9:28 AM PGY-1, [place] Intern pager: [phone], text pages welcome

## 2020-06-17 NOTE — TOC Progression Note (Signed)
Transition of Care [facility]) - Progression Note    Patient Details  Name: [name] MRN: [id number] Date of Birth: [birth year]  Transition of Care [facility]) CM/SW [place], [place] Phone Number: 06/17/2020, 4:02 PM  Clinical Narrative:     CSW uploaded Documents to ncmust.  No bed offers. Otila Kluver from [place] unable to accept.   Pt wife has not returned since initial visit  Expected Discharge Plan: Assisted Living Barriers to Discharge: Continued Medical Work up  Expected Discharge Plan and Services Expected Discharge Plan: Assisted Living       Living arrangements for the past 2 months: [place] ([place])                                       Social Determinants of Health (SDOH) Interventions    Readmission Risk Interventions No flowsheet data found.

## 2020-06-18 DIAGNOSIS — F419 Anxiety disorder, unspecified: Secondary | ICD-10-CM | POA: Insufficient documentation

## 2020-06-18 DIAGNOSIS — I824Z9 Acute embolism and thrombosis of unspecified deep veins of unspecified distal lower extremity: Secondary | ICD-10-CM | POA: Diagnosis not present

## 2020-06-18 MED ORDER — NICOTINE 14 MG/24HR TD PT24
14.0000 mg | MEDICATED_PATCH | Freq: Every day | TRANSDERMAL | Status: DC
  Administered 2020-06-19 – 2020-06-26 (×8): 14 mg via TRANSDERMAL
  Filled 2020-06-18 (×8): qty 1

## 2020-06-18 MED ORDER — NICOTINE POLACRILEX 2 MG MT GUM
2.0000 mg | CHEWING_GUM | OROMUCOSAL | Status: DC | PRN
  Administered 2020-06-18: 2 mg via ORAL
  Filled 2020-06-18 (×2): qty 1

## 2020-06-18 MED ORDER — HYDROXYZINE HCL 25 MG PO TABS
25.0000 mg | ORAL_TABLET | Freq: Every evening | ORAL | Status: DC | PRN
  Administered 2020-06-18 – 2020-06-25 (×7): 25 mg via ORAL
  Filled 2020-06-18 (×8): qty 1

## 2020-06-18 NOTE — Progress Notes (Addendum)
Dementia note faxed to Jacqualin Combes from [place] at [phone] to receive PASSR.   CSW called [employer]. Request to speak to case worker/admissions coordinator for pt. CSW is transferred, on hold for 15 minutes. CSW hung up.

## 2020-06-18 NOTE — Progress Notes (Signed)
Family Medicine Teaching Service Daily Progress Note Intern Pager: [phone]  Patient name: [name] Medical record number: [id number] Date of birth: October 06, 1950 Age: 69 y.o. Gender: male  Primary Care Provider: Danna Hefty, DO Consultants: Neurology, psychiatry, vascular surgery Code Status: Full   Pt Overview and Major Events to Date:  Admitted 10/19  Assessment and Plan: Corey Barnhill Iveryis a 69 y.o.malepresenting with RLE swelling and erythema + aphasia. PMH is significant for recent right foot crush injury s/p ORIF, primary schizophrenia, HFpEF,HTN, HLD, BPH, tobacco abuse, GERD, history of hep C s/p Harvoniandhistory of hep B cleared. Medically stable for discharge, dispo pending.   RLE DVT PatientwithRLE swelling and erythema found to havechronic DVT involving the right peroneal veins without a cystic structure found in the popliteal fossa on DVT study 10/18.Treatment withDOAC for 3-6 months per vascular surgery. -rivaroxaban15mg  BID; transition to 20mg  qd on 06/28/20 - PT/OT recs, pending safe disposition to SNF  Schizophrenia Stable.History of schizophrenia with echolalia and mumbling speech at baseline. Patient is resident of [place] but has history of wandering off; last seen at facility on 10/15. - psychiatryconsulted,no changes recommended. - s/p Prolixin (25 mg q14d) injection(last given10/22), next dose 11/5 - benztropine 1 mg BID -to minimize disturbance contributing to agitation: labs to be drawn every 2-3 days, avoid am labs -ramelteon 8 mg bedtime  Social needs Patientpreviouslyhomeless,recently staying at [place] this is likely affecting his medication compliance. Pt lacks capacity and surrogate decision makers have been difficult to contact. May need to be in a living facility with a higher level of supervision, but placement to any facility has been difficult. Current plans are for pt to  return to SNF then [place] consult,greatlyappreciate involvement and assistancein touching base with wife and SNF placement. Per SW, wife is agreeable to administer COVID vaccination if needed and agrees to SNF placement as well.Reached out to SW regarding any further updates, appreciate continued assistance.   -patient apparently received COVID vaccine at [facility] previously per family who visited the evening of 11/1, but no records noted of this, communicated this with SW and will follow up   Thrombocytopenia Chronic andlikely resolved.History of low plateletcount with baseline around 100-200.Most recent platelets152wnl on 11/1. -Continue to monitorCBCperiodically  CKD IIIa Creatinine was1.36 on admission, baseline 1.3-1.4.Most recentCr 1.44and GFR53on 11/1. -Continue to monitorperiodically.  HTN BP this morning 111/73. Home meds: Amlodipine 10mg  QD.Previously took losartan. -continue home Amlodipine  Normocytic Anemia Chronicandstable.Most recent Hgb 11.7 and MCV 90.1on 11/1.Hgb 11.3 on admission, baseline 10-11. No signs of acute bleed.Likely secondary to anemia of chronic disease from history of CKD. -Continue to monitor -Recommend outpatient colonoscopy and anemia workup -Continue folic acid1 mg daily   Right foot fracture H/o Lisfranc fracture s/p ORIF by [name] on 9/14. Saw [name] for follow up visit on 10/14 who recommended to continue with fracture boot with weight bearing as tolerated and follow up with repeat imaging in 4 weeks. Foot x-ray on admission notable for healing fractures of the second through fifth metatarsal. No interval change from the prior study. -Continue fracture boot -PT/OT, OT recommends SNF, pending placement   FEN/GI: regular diet  PPx: rivaroxaban    Disposition: medically stable for discharge, pending SNF placement        Subjective:  No acute overnight events reported. Wife may  potentially visit today, informed front desk to page family medicine team when wife returns.   Objective: Temp:  [98.6 F (37 C)] 98.6  F (37 C) (11/01 1952) Pulse Rate:  [59-65] 59 (11/01 1952) Resp:  [17-18] 17 (11/01 1952) BP: (101-111)/(68-73) 111/73 (11/01 1952) SpO2:  [94 %-100 %] 94 % (11/01 1952) Physical Exam: General: Patient sitting upright eating breakfast, in no acute distress. Well-appearing. Cardiovascular: RRR, no murmurs or gallops auscultated  Respiratory: lungs clear to auscultation bilaterally  Abdomen: soft, nontender, presence of active bowel sounds  Extremities: left dorsalis pedis pulse intact, no LLE edema noted, right fracture boot in place Neuro: AOx4, very excited and awaiting potential arrival of wife  Laboratory: Recent Labs  Lab 06/11/20 1514 06/14/20 1530 06/17/20 0751  WBC 4.1 4.8 4.2  HGB 11.9* 11.8* 11.7*  HCT 35.8* 36.1* 35.4*  PLT 149* 150 152   Recent Labs  Lab 06/11/20 1514 06/14/20 1530 06/17/20 0751  NA 139 137 141  K 4.3 4.1 4.0  CL 104 101 104  CO2 26 28 28   BUN 15 17 22   CREATININE 1.60* 1.38* 1.44*  CALCIUM 8.7* 8.9 9.1  GLUCOSE 86 109* 82      Imaging/Diagnostic Tests: No results found.  [name], DO 06/18/2020, 6:42 AM PGY-1, [place] Intern pager: [phone], text pages welcome

## 2020-06-18 NOTE — Plan of Care (Signed)

## 2020-06-18 NOTE — Progress Notes (Signed)
Physical Therapy Treatment Patient Details Name: [name] MRN: [id number] DOB: 11-16-1950 Today's Date: 06/18/2020    History of Present Illness Pt is a 69 y/o male admitted from ALF on 06/04/20 secondary to RLE swelling; (+) RLE DVT and started on lovenox. Of note, pt with recent R foot fx and is s/p ORIF. Other PMH includes schizophrenia, HTN, tobacco use, hep C.   PT Comments    Pt progressing with mobility. Able to transfer and ambulate with HHA and min guard for balance; limited by poor balance strategies and impaired cognition. Noted plan for return to ALF upon d/c. Will continue to follow acutely to maximize functional mobility and independence.    Follow Up Recommendations  SNF;Supervision/Assistance - 24 hour (vs. return to ALF)     Equipment Recommendations  None recommended by PT    Recommendations for Other Services       Precautions / Restrictions Precautions Precautions: Fall Required Braces or Orthoses: Other Brace Other Brace: CAM boot RLE Restrictions Weight Bearing Restrictions: Yes RLE Weight Bearing: Weight bearing as tolerated Other Position/Activity Restrictions: in cam walker boot    Mobility  Bed Mobility               General bed mobility comments: Received sitting at end of bed attempting to get up with NT/sitter  Transfers Overall transfer level: Needs assistance Equipment used: 1 person hand held assist Transfers: Sit to/from Stand Sit to Stand: Min guard         General transfer comment: HHA to intiate task to stand, min guard for balance  Ambulation/Gait Ambulation/Gait assistance: Min guard;Min assist Gait Distance (Feet): 120 Feet Assistive device: 1 person hand held assist Gait Pattern/deviations: Step-through pattern;Decreased stride length     General Gait Details: Initial HHA to initiate task, close min guard for balance; pt walking fast, easily distracted and difficult to redirect; HHA to assist redirection and  guidance back to room; pt with inconsistent command following   Stairs             Wheelchair Mobility    Modified Rankin (Stroke Patients Only)       Balance Overall balance assessment: Needs assistance Sitting-balance support: Feet supported;No upper extremity supported Sitting balance-Leahy Scale: Good     Standing balance support: During functional activity Standing balance-Leahy Scale: Fair Standing balance comment: Can stand and take steps without UE support; stability improved with HHA                            Cognition Arousal/Alertness: Awake/alert Behavior During Therapy: Impulsive;Restless Overall Cognitive Status: History of cognitive impairments - at baseline Area of Impairment: Attention;Following commands;Memory;Safety/judgement;Awareness;Problem solving;Orientation                   Current Attention Level: Focused;Sustained Memory: Decreased short-term memory Following Commands: Follows one step commands with increased time;Follows one step commands inconsistently Safety/Judgement: Decreased awareness of safety;Decreased awareness of deficits Awareness: Intellectual Problem Solving: Requires verbal cues General Comments: Pleasant and motivated to get OOB. Few verbalizations and difficult to understand at times; becoming more talkative to nursing student he seemed to recognize in hallway. Requiring max, multimodal cues to redirect to current task; marked deficits in awareness, safety and ability to care for self noted      Exercises      General Comments General comments (skin integrity, edema, etc.): Sitter present. Unable to redirect pt to continue session once returned to room  and saw dinner tray      Pertinent Vitals/Pain Pain Assessment: Faces Faces Pain Scale: No hurt Pain Intervention(s): Monitored during session    Home Living                      Prior Function            PT Goals (current goals can  now be found in the care plan section) Progress towards PT goals: Progressing toward goals    Frequency    Min 2X/week      PT Plan Current plan remains appropriate    Co-evaluation              AM-PAC PT "6 Clicks" Mobility   Outcome Measure  Help needed turning from your back to your side while in a flat bed without using bedrails?: None Help needed moving from lying on your back to sitting on the side of a flat bed without using bedrails?: None Help needed moving to and from a bed to a chair (including a wheelchair)?: A Little Help needed standing up from a chair using your arms (e.g., wheelchair or bedside chair)?: A Little Help needed to walk in hospital room?: A Little Help needed climbing 3-5 steps with a railing? : A Little 6 Click Score: 20    End of Session   Activity Tolerance: Patient tolerated treatment well Patient left: in chair;with call bell/phone within reach;with nursing/sitter in room Nurse Communication: Mobility status PT Visit Diagnosis: Unsteadiness on feet (R26.81);Muscle weakness (generalized) (M62.81);Other symptoms and signs involving the nervous system (R29.898)     Time: 1610-9604 PT Time Calculation (min) (ACUTE ONLY): 10 min  Charges:  $Therapeutic Exercise: 8-22 mins                     Mabeline Caras, PT, DPT Acute Rehabilitation Services  Pager [phone] Office [place] 06/18/2020, 5:48 PM

## 2020-06-18 NOTE — Progress Notes (Signed)
Received a page from [employer]- Pt complains of anxiety and craving for cigarettes. Pt asking for Nicotine patch. Hydroxyzine 25 mg QHS Prn ordered for anxiety and insomnia and Nicotine gum is ordered for nicotine dependence for now. Nicoderm 14 mg patch daily PRN is ordered from tomorrow for Nicotine dependence.

## 2020-06-18 NOTE — Plan of Care (Signed)
°  Problem: Education: Goal: Knowledge of General Education information will improve Description: Including pain rating scale, medication(s)/side effects and non-pharmacologic comfort measures Outcome: Progressing   Problem: Health Behavior/Discharge Planning: Goal: Ability to manage health-related needs will improve Outcome: Progressing   Problem: Clinical Measurements: Goal: Ability to maintain clinical measurements within normal limits will improve Outcome: Progressing Goal: Will remain free from infection Outcome: Progressing Goal: Diagnostic test results will improve Outcome: Progressing Goal: Respiratory complications will improve Outcome: Progressing Goal: Cardiovascular complication will be avoided Outcome: Progressing   Problem: Activity: Goal: Risk for activity intolerance will decrease Outcome: Progressing   Problem: Nutrition: Goal: Adequate nutrition will be maintained Outcome: Progressing   Problem: Coping: Goal: Level of anxiety will decrease Outcome: Progressing   Problem: Elimination: Goal: Will not experience complications related to bowel motility Outcome: Progressing Goal: Will not experience complications related to urinary retention Outcome: Progressing   Problem: Pain Managment: Goal: General experience of comfort will improve Outcome: Progressing   Problem: Safety: Goal: Ability to remain free from injury will improve Outcome: Progressing   Problem: Skin Integrity: Goal: Risk for impaired skin integrity will decrease Outcome: Progressing   Problem: Education: Goal: Utilization of techniques to improve thought processes will improve Outcome: Progressing Goal: Knowledge of the prescribed therapeutic regimen will improve Outcome: Progressing   Problem: Activity: Goal: Interest or engagement in leisure activities will improve Outcome: Progressing Goal: Imbalance in normal sleep/wake cycle will improve Outcome: Progressing   Problem:  Coping: Goal: Coping ability will improve Outcome: Progressing Goal: Will verbalize feelings Outcome: Progressing   Problem: Health Behavior/Discharge Planning: Goal: Ability to make decisions will improve Outcome: Progressing Goal: Compliance with therapeutic regimen will improve Outcome: Progressing   Problem: Role Relationship: Goal: Will demonstrate positive changes in social behaviors and relationships Outcome: Progressing   Problem: Safety: Goal: Ability to disclose and discuss suicidal ideas will improve Outcome: Progressing Goal: Ability to identify and utilize support systems that promote safety will improve Outcome: Progressing   Problem: Self-Concept: Goal: Will verbalize positive feelings about self Outcome: Progressing Goal: Level of anxiety will decrease Outcome: Progressing   

## 2020-06-18 NOTE — TOC Progression Note (Addendum)
Transition of Care [facility]) - Progression Note    Patient Details  Name: [name] MRN: [id number] Date of Birth: 03-11-1951  Transition of Care [facility]) CM/SW [place], [place] Phone Number: 06/18/2020, 11:51 AM  Clinical Narrative:     CSW called [facility] to inquire about vaccination. Staff member was able to confirm that pt was a client there. Reception staff was unable to identify if pt had received vaccine. She transferred CSW to nurse line; call rang for 4 minutes and then dropped. CSW attempted again; call rang for 4 minutes and dropped. CSW left message with appointment line requesting call back.   1215: CSW called [name] [phone] from [facility] after she left message with 6N CM requesting pt get Prolaxin shot. She explained that pt received Prolaxin shot every 2 weeks with them. She shares some history of pt having difficulty at [facility] due to him frequently leaving facility. She stated that he had an ACTT team assessment on 06/11/20 but missed it due to being in the hospital. [name] reports that she is not aware of Behavioral Health providing vaccinations.   1230: CSW received return call from [name] who stated they do not have records of pt vaccination but the [facility] [phone] sometimes provides their patients with covid vaccines.   1350: CSW called [facility]. Confirmed pt received Moderna vaccine, 1st shot in April 2021 and 2nd shot in May 2021. They will fax records to [place].   Expected Discharge Plan: Assisted Living Barriers to Discharge: Continued Medical Work up  Expected Discharge Plan and Services Expected Discharge Plan: Assisted Living       Living arrangements for the past 2 months: [facility] ([place])                                       Social Determinants of Health (SDOH) Interventions    Readmission Risk Interventions No flowsheet data found.

## 2020-06-18 NOTE — Progress Notes (Signed)
RE: [name] Date of Birth: 04/05/1951 Date: 06/18/20  Please be advised that the above-named patient has a primary diagnosis of dementia which supersedes any psychiatric diagnosis. Patient will require a short-term nursing home stay - anticipated 30 days or less for rehabilitation and strengthening.  The plan is for return home.

## 2020-06-19 DIAGNOSIS — I824Z9 Acute embolism and thrombosis of unspecified deep veins of unspecified distal lower extremity: Secondary | ICD-10-CM | POA: Diagnosis not present

## 2020-06-19 DIAGNOSIS — G9340 Encephalopathy, unspecified: Secondary | ICD-10-CM | POA: Diagnosis not present

## 2020-06-19 NOTE — Progress Notes (Addendum)
CSW called [facility] requesting to speak to case worker/admissions staff for pt. CSW is informed that staff person is currently unavailable (out of facility with residents) for multiple hours. CSW to try again later. CSW left contact information to be called back.   CSW called [facility], Pt's managed Medicaid provider @ [phone] and left message requesting a callback from case manager Ledora Bottcher (@ [phone]  Case [phone]) assigned to Pt in an effort to coordinate care.  1534; CSW received call back from [facility]. They will have meeting in AM regarding pt and whether or not he would be able to return.   1538: CSW is able to reach [facility]. She notifies CSW that pt is not currently on her caseload. She states they were never able to contact him by phone and that he was initially contacted by them due to having medicaid and frequent visits to the ED. She states that pt would not qualify for their services anymore (giving lists of resources) since he is a facility resident.

## 2020-06-19 NOTE — Progress Notes (Signed)
Occupational Therapy Treatment Patient Details Name: [name] MRN: [id number] DOB: Sep 29, 1950 Today's Date: 06/19/2020    History of present illness Pt is a 69 y/o male admitted from ALF on 06/04/20 secondary to RLE swelling; (+) RLE DVT and started on lovenox. Of note, pt with recent R foot fx and is s/p ORIF. Other PMH includes schizophrenia, HTN, tobacco use, hep C.   OT comments  Pt progressing well. Goals updated. Pt continues to require multiple cues to attend to task, but follows all commands. Pt ambulatory in room with and without RW with minguardA to [name]. Pt performing grooming at sink and LB dressing at EOB. Pt would benefit from continued OT skilled services for ADL and safety. OT following acutely. Rec'd SNF.    Follow Up Recommendations  SNF;Supervision/Assistance - 24 hour    Equipment Recommendations  None recommended by OT    Recommendations for Other Services      Precautions / Restrictions Precautions Precautions: Fall Restrictions Weight Bearing Restrictions: Yes RLE Weight Bearing: Weight bearing as tolerated       Mobility Bed Mobility Overal bed mobility: Needs Assistance Bed Mobility: Supine to Sit     Supine to sit: Supervision Sit to supine: Supervision   General bed mobility comments: no physical assist  Transfers Overall transfer level: Needs assistance Equipment used: 1 person hand held assist Transfers: Sit to/from Stand Sit to Stand: Min guard              Balance Overall balance assessment: Needs assistance Sitting-balance support: Feet supported;No upper extremity supported Sitting balance-Leahy Scale: Good     Standing balance support: During functional activity Standing balance-Leahy Scale: Fair Standing balance comment: support with RW, but pt kept picking it up and not using the RW safely.                           ADL either performed or assessed with clinical judgement   ADL Overall ADL's :  Needs assistance/impaired     Grooming: Supervision/safety;Standing Grooming Details (indicate cue type and reason): given materials; pt with cues able to brush teeth and comb hair             Lower Body Dressing: Min guard;Sitting/lateral leans;Sit to/from stand Lower Body Dressing Details (indicate cue type and reason): applying CAM boot sitting EOB Toilet Transfer: Min guard   Toileting- Clothing Manipulation and Hygiene: Minimal assistance;Sitting/lateral lean;Sit to/from stand       Functional mobility during ADLs: Minimal assistance;Cueing for safety General ADL Comments: Pt not consistently following commands     Vision   Vision Assessment?: No apparent visual deficits   Perception     Praxis      Cognition Arousal/Alertness: Awake/alert Behavior During Therapy: Flat affect;Impulsive;Restless Overall Cognitive Status: History of cognitive impairments - at baseline Area of Impairment: Attention;Following commands;Memory;Safety/judgement;Awareness;Problem solving;Orientation                 Orientation Level: Situation;Time;Place;Disoriented to Current Attention Level: Focused;Sustained Memory: Decreased short-term memory Following Commands: Follows one step commands with increased time;Follows one step commands inconsistently Safety/Judgement: Decreased awareness of safety;Decreased awareness of deficits Awareness: Intellectual Problem Solving: Requires verbal cues General Comments: Communicating with gestures mostly. Slurred/broken speech        Exercises     Shoulder Instructions       General Comments bed alarm on    Pertinent Vitals/ Pain       Pain Assessment: Faces Faces  Pain Scale: No hurt Pain Intervention(s): Monitored during session  Home Living                                          Prior Functioning/Environment              Frequency  Min 2X/week        Progress Toward Goals  OT Goals(current  goals can now be found in the care plan section)  Progress towards OT goals: Progressing toward goals  Acute Rehab OT Goals Patient Stated Goal: No goals stated OT Goal Formulation: Patient unable to participate in goal setting Time For Goal Achievement: 07/03/20 Potential to Achieve Goals: Good ADL Goals Pt Will Perform Grooming: with supervision;standing Pt Will Perform Upper Body Dressing: with set-up;sitting Pt Will Perform Lower Body Dressing: with supervision;sitting/lateral leans;sit to/from stand Pt Will Transfer to Toilet: with supervision;ambulating;regular height toilet Pt Will Perform Toileting - Clothing Manipulation and hygiene: with min guard assist;sit to/from stand Additional ADL Goal #1: Pt will follow one step commands with min VC's Additional ADL Goal #2: Pt will reduce risk of falls by recalling 3/5 unsafe actions during ADL with mod VCs  Plan Discharge plan remains appropriate    Co-evaluation                 AM-PAC OT "6 Clicks" Daily Activity     Outcome Measure   Help from another person eating meals?: A Little Help from another person taking care of personal grooming?: A Little Help from another person toileting, which includes using toliet, bedpan, or urinal?: A Little Help from another person bathing (including washing, rinsing, drying)?: A Little Help from another person to put on and taking off regular upper body clothing?: A Little Help from another person to put on and taking off regular lower body clothing?: A Little 6 Click Score: 18    End of Session Equipment Utilized During Treatment: Gait belt;Rolling walker  OT Visit Diagnosis: Unsteadiness on feet (R26.81);Other abnormalities of gait and mobility (R26.89);Muscle weakness (generalized) (M62.81);Other symptoms and signs involving cognitive function   Activity Tolerance Patient tolerated treatment well   Patient Left in bed;with call bell/phone within reach;with bed alarm set;with  nursing/sitter in room   Nurse Communication Mobility status        Time: 7902-4097 OT Time Calculation (min): 18 min  Charges: OT General Charges $OT Visit: 1 Visit OT Treatments $Self Care/Home Management : 8-22 mins  Jefferey Pica, OTR/L Acute Rehabilitation Services Pager: [phone] Office: [phone]    Jefferey Pica 06/19/2020, 6:03 PM

## 2020-06-19 NOTE — Progress Notes (Signed)
Family Medicine Teaching Service Daily Progress Note Intern Pager: [phone]  Patient name: [name] Medical record number: [id number] Date of birth: 03/11/1951 Age: 69 y.o. Gender: male  Primary Care Provider: Danna Hefty, DO Consultants: Neurology, psychiatry, vascular surgery  Code Status: Full  Pt Overview and Major Events to Date:  Admitted 10/19  Assessment and Plan: [name] a 69 y.o.malepresenting with RLE swelling and erythema + aphasia. PMH is significant for recent right foot crush injury s/p ORIF, primary schizophrenia, HFpEF,HTN, HLD, BPH, tobacco abuse, GERD, history of hep C s/p Harvoniandhistory of hep B cleared. Medically stable for discharge, dispo pending.   RLE DVT PatientwithRLE swelling and erythema found to havechronic DVT involving the right peroneal veins without a cystic structure found in the popliteal fossa on DVT study 10/18.Treatment withDOAC for 3-6 months per vascular surgery. -rivaroxaban15mg  BID; transition to 20mg  qd on 06/28/20 - PT/OT recs, pending safe disposition to SNF  Schizophrenia Stable.History of schizophrenia with echolalia and mumbling speech at baseline. Patient is resident of [facility] but has history of wandering off; last seen at facility on 10/15. - psychiatryconsulted,no changes recommended. - s/p Prolixin (25 mg q14d) injection(last given10/22), next dose 11/5 - benztropine 1 mg BID -ramelteon 8 mg bedtime -hydroxyzine 25 mg prn bedtime for anxiety   Social needs Patientpreviouslyhomeless,recently staying at [place] this is likely affecting his medication compliance. Pt lacks capacity and surrogate decision makers have been difficult to contact. May need to be in a living facility with a higher level of supervision, but placement to any facility has been difficult. Current plans are for pt to return to SNF then [facility]  consult,greatlyappreciate involvement and assistancein touching base with wife and SNF placement. Per SW, wife is agreeable to administer COVID vaccination if needed and agrees to SNF placement as well.Reached out to SW regarding any further updates, appreciate continued assistance. -patient apparently received COVID vaccine at [facility], records to be faxed over per SW, appreciate assistance   Thrombocytopenia Chronic andlikely resolved.History of low plateletcount with baseline around 100-200.Most recent platelets152wnl on 11/1.  CKD IIIa Chronic and stable. Creatinine was1.36 on admission, baseline 1.3-1.4.Most recentCr 1.44and GFR53on 11/1.  HTN BP this morning130/85.Home meds: Amlodipine 10mg  QD. -continue home Amlodipine - monitor BP  Normocytic Anemia Chronicandstable.Most recent Hgb 11.7 and MCV 90.1on 11/1.Hgb 11.3 on admission, baseline 10-11. No signs of acute bleed.Likely secondary to anemia of chronic disease from history of CKD. -Continue folic acid1 mg daily   Right foot fracture H/o Lisfranc fracture s/p ORIF by [name] on 9/14. Saw [name] for follow up visit on 10/14 who recommended to continue with fracture boot with weight bearing as tolerated and follow up with repeat imaging in 4 weeks. Foot x-ray on admission notable for healing fractures of the second through fifth metatarsal. No interval change from the prior study. -Continue fracture boot -PT/OT, OT recommended SNF, pending placement - consider performing repeat imaging inpatient if patient remains in the hospital as this date is approaching   Tobacco use Patient experiencing occasional cravings.  -nicotine patch 14 mg  - encourage smoking cessation   FEN/GI: regular diet  PPx: rivaroxaban    Disposition: medically stable for discharge, awaiting SNF placement         Subjective:  Overnight patient craving cigarette, nicotine gum given and hydroxyzine added  for anxiety and tremors. Patient asleep throughout exam.   Objective: Temp:  [97.7 F (36.5 C)-98.2 F (36.8 C)] 98.2 F (  36.8 C) (11/02 2136) Pulse Rate:  [47-69] 69 (11/02 2136) Resp:  [16-18] 18 (11/02 2136) BP: (112-130)/(57-85) 130/85 (11/02 2136) SpO2:  [100 %] 100 % (11/02 2136) Physical Exam: General: Patient sleeping comfortably, in no acute distress. Cardiovascular: RRR, no murmurs auscultated  Respiratory: lungs clear to auscultation bilaterally Abdomen: soft, nontender, presence of active bowel sounds Extremities: right fracture boot in place, no LLE edema noted, distal pulses strong and equal bilaterally  Neuro: easily arousable to voice   Laboratory: Recent Labs  Lab 06/14/20 1530 06/17/20 0751  WBC 4.8 4.2  HGB 11.8* 11.7*  HCT 36.1* 35.4*  PLT 150 152   Recent Labs  Lab 06/14/20 1530 06/17/20 0751  NA 137 141  K 4.1 4.0  CL 101 104  CO2 28 28  BUN 17 22  CREATININE 1.38* 1.44*  CALCIUM 8.9 9.1  GLUCOSE 109* 82      Imaging/Diagnostic Tests: No results found.  [name], DO 06/19/2020, 5:47 AM PGY-1, [facility] Intern pager: [phone], text pages welcome

## 2020-06-20 DIAGNOSIS — I824Z1 Acute embolism and thrombosis of unspecified deep veins of right distal lower extremity: Secondary | ICD-10-CM | POA: Diagnosis not present

## 2020-06-20 NOTE — Plan of Care (Signed)
  Problem: Clinical Measurements: Goal: Ability to maintain clinical measurements within normal limits will improve Outcome: Progressing Goal: Will remain free from infection Outcome: Progressing Goal: Diagnostic test results will improve Outcome: Progressing Goal: Respiratory complications will improve Outcome: Progressing Goal: Cardiovascular complication will be avoided Outcome: Progressing   Problem: Skin Integrity: Goal: Risk for impaired skin integrity will decrease Outcome: Progressing   

## 2020-06-20 NOTE — TOC Progression Note (Signed)
Transition of Care [facility]) - Progression Note    Patient Details  Name: [name] MRN: [id number] Date of Birth: [birth year]  Transition of [facility]) CM/SW [place], [place] Phone Number: 06/20/2020, 4:00 PM  Clinical Narrative:    CSW spoke with [facility], Damien Fusi. She confirmed that it is a possibility for pt to return but would need to be discussed closer to d/c. CSW explained recommendation for pt to go to SNF and how that would require pt's SSI check. Hazel explained that pt's check would still be need to hold bed at ALF   Expected Discharge Plan: Assisted Living Barriers to Discharge: Continued Medical Work up  Expected Discharge Plan and Services Expected Discharge Plan: Assisted Living       Living arrangements for the past 2 months: [place] ([place])                                       Social Determinants of Health (SDOH) Interventions    Readmission Risk Interventions No flowsheet data found.

## 2020-06-20 NOTE — Progress Notes (Signed)
Physical Therapy Treatment Patient Details Name: [name] MRN: [id number] DOB: [birth year] Today's Date: 06/20/2020    History of Present Illness Pt is a 69 y/o male admitted from ALF on 06/04/20 secondary to RLE swelling; (+) RLE DVT and started on lovenox. Of note, pt with recent R foot fx and is s/p ORIF. Other PMH includes schizophrenia, HTN, tobacco use, hep C.    PT Comments    Pt progressing well with activity tolerance, ambulating hallway distance with intermittent PT assist to correct LOB. Pt requires frequent PT cuing for safe hallway navigation and cannot find his way back to room when prompted to do so. D/c plan remains appropriate, will continue to follow acutely.   Follow Up Recommendations  SNF;Supervision/Assistance - 24 hour (vs. return to ALF)     Equipment Recommendations  None recommended by PT    Recommendations for Other Services       Precautions / Restrictions Precautions Precautions: Fall Required Braces or Orthoses: Other Brace Other Brace: WBAT in CAM boot Restrictions Weight Bearing Restrictions: Yes RLE Weight Bearing: Weight bearing as tolerated    Mobility  Bed Mobility Overal bed mobility: Needs Assistance Bed Mobility: Supine to Sit     Supine to sit: Supervision Sit to supine: Supervision   General bed mobility comments: for safety, increased time  Transfers Overall transfer level: Needs assistance Equipment used: None Transfers: Sit to/from Stand Sit to Stand: Min guard         General transfer comment: for safety, pt unsteady initially but self-steadied with increased time.  Ambulation/Gait Ambulation/Gait assistance: Min guard;Min assist Gait Distance (Feet): 200 Feet Assistive device: 1 person hand held assist;None Gait Pattern/deviations: Step-through pattern;Decreased stride length;Trunk flexed;Staggering left Gait velocity: Decreased   General Gait Details: Min guard for safety, x2 periods of min assist to  correct pt posterior LOB. Pt staggers L, requires cuing to maintain straight trajectory.   Stairs             Wheelchair Mobility    Modified Rankin (Stroke Patients Only)       Balance Overall balance assessment: Needs assistance Sitting-balance support: Feet supported;No upper extremity supported Sitting balance-Leahy Scale: Good Sitting balance - Comments: able to adjust shoe on L foot sitting EOB   Standing balance support: During functional activity Standing balance-Leahy Scale: Fair Standing balance comment: intermittent HHA, PT assisting pt in correcting LOB                            Cognition Arousal/Alertness: Awake/alert Behavior During Therapy: Impulsive Overall Cognitive Status: History of cognitive impairments - at baseline Area of Impairment: Attention;Following commands;Memory;Safety/judgement;Awareness;Problem solving                   Current Attention Level: Sustained Memory: Decreased short-term memory Following Commands: Follows one step commands with increased time;Follows one step commands inconsistently Safety/Judgement: Decreased awareness of safety;Decreased awareness of deficits Awareness: Intellectual Problem Solving: Requires verbal cues General Comments: Pt repeats back much of what PT says, otherwise difficult to understand. Aphasic speech      Exercises      General Comments        Pertinent Vitals/Pain Pain Assessment: Faces Faces Pain Scale: Hurts a little bit Pain Location: R foot Pain Descriptors / Indicators: Discomfort Pain Intervention(s): Limited activity within patient's tolerance;Monitored during session;Repositioned    Home Living  Prior Function            PT Goals (current goals can now be found in the care plan section) Acute Rehab PT Goals PT Goal Formulation: With patient Time For Goal Achievement: 07/04/20 Potential to Achieve Goals: Fair Progress  towards PT goals: Progressing toward goals    Frequency    Min 2X/week      PT Plan Current plan remains appropriate    Co-evaluation              AM-PAC PT "6 Clicks" Mobility   Outcome Measure  Help needed turning from your back to your side while in a flat bed without using bedrails?: None Help needed moving from lying on your back to sitting on the side of a flat bed without using bedrails?: None Help needed moving to and from a bed to a chair (including a wheelchair)?: A Little Help needed standing up from a chair using your arms (Lowe.g., wheelchair or bedside chair)?: A Little Help needed to walk in hospital room?: A Little Help needed climbing 3-5 steps with a railing? : A Little 6 Click Score: 20    End of Session Equipment Utilized During Treatment: Gait belt Activity Tolerance: Patient tolerated treatment well;Patient limited by fatigue Patient left: with call bell/phone within reach;in bed;with bed alarm set Nurse Communication: Mobility status PT Visit Diagnosis: Unsteadiness on feet (R26.81);Muscle weakness (generalized) (M62.81);Other symptoms and signs involving the nervous system (R29.898)     Time: 1100-1118 PT Time Calculation (min) (ACUTE ONLY): 18 min  Charges:  $Gait Training: 8-22 mins                     [name], PT Acute Rehabilitation Services Pager [phone]  Office [phone]   [name] 06/20/2020, 2:48 PM

## 2020-06-20 NOTE — Progress Notes (Signed)
Family Medicine Teaching Service Daily Progress Note Intern Pager: [phone]  Patient name: [name] Medical record number: [id number] Date of birth: Feb 04, 1951 Age: 69 y.o. Gender: male  Primary Care Provider: Danna Hefty, DO Consultants: Neurology, psychiatry, vascular surgery Code Status: Full  Pt Overview and Major Events to Date:  Admitted 10/19  Assessment and Plan: [name] a 69 y.o.malepresenting with RLE swelling and erythema + aphasia. PMH is significant for recent right foot crush injury s/p ORIF, primary schizophrenia, HFpEF,HTN, HLD, BPH, tobacco abuse, GERD, history of hep C s/p Harvoniandhistory of hep B cleared. Medically stable for discharge, dispo pending.   RLE DVT PatientwithRLE swelling and erythema found to havechronic DVT involving the right peroneal veins without a cystic structure found in the popliteal fossa on DVT study 10/18.Treatment withDOAC for 3-6 months per vascular surgery. -rivaroxaban15mg  BID; transition to 20mg  qd on 06/28/20 - PT/OT recs, pending safe disposition to SNF  Schizophrenia Stable.History of schizophrenia with echolalia and mumbling speech at baseline. Patient is resident of [facility] but has history of wandering off; last seen at facility on 10/15. - psychiatryconsulted,no changes recommended. - s/p Prolixin (25 mg q14d) injection(last given10/22), next dose 11/5 - benztropine 1 mg BID -ramelteon 8 mg bedtime -hydroxyzine 25 mg prn bedtime for anxiety   Social needs Patientpreviouslyhomeless,recently staying at [facility] this is likely affecting his medication compliance. Pt lacks capacity and surrogate decision makers have been difficult to contact. May need to be in a living facility with a higher level of supervision, but placement to any facility has been difficult. Current plans are for pt to return to SNF then [place]  consult,greatlyappreciate involvement and assistancein touching base with wife and SNF placement. Per SW, wife is agreeable to administer COVID vaccination if needed and agrees to SNF placement as well.Reached out to SW regarding any further updates, appreciate continued assistance. -patient apparently [facility]OVID vaccineat [facility], records to be faxed over per SW, appreciate assistance   Thrombocytopenia Chronic andlikely resolved.History of low plateletcount with baseline around 100-200.Most recent platelets152wnlon11/1. -monitor with CBC only if indicated   CKD IIIa Chronic and stable. Creatinine was1.36 on admission, baseline 1.3-1.4.Most recentCr 1.44and [id number].  HTN BP this morning120/77.Home meds: Amlodipine 10mg  QD. -continue home Amlodipine - monitor BP  Normocytic Anemia Chronicandstable.Most recent Hgb 11.7 and MCV 90.1on11/1.Hgb 11.3 on admission, baseline 10-11. No signs of acute bleed.Likely secondary to anemia of chronic disease from history of CKD. -Continue folic acid1 mg daily   Right foot fracture H/o Lisfranc fracture s/p ORIF by [name] on 9/14. Saw [name] for follow up visit on 10/14 who recommended to continue with fracture boot with weight bearing as tolerated and follow up with repeat imaging in 4 weeks. Foot x-ray on admission notable for healing fractures of the second through fifth metatarsal. No interval change from the prior study. -Continue fracture boot -PT/OT, OT recommended SNF, pending placement - consider performing repeat imaging inpatient if patient remains in the hospital as this date is approaching   Tobacco use Patient experiencing occasional cravings.  -nicotine patch 14 mg  - encourage smoking cessation    FEN/GI: regular diet  PPx: rivaroxaban   Disposition: medically stable, pending safe disp to SNF       Subjective:  No significant events reported overnight.    Objective: Temp:  [98.1 F (36.7 C)-98.3 F (36.8 C)] 98.1 F (36.7 C) (11/04 0540) Pulse Rate:  [60-66] 66 (11/04 0540) Resp:  [18] 18 (  11/04 0540) BP: (120-128)/(70-77) 120/77 (11/04 0540) SpO2:  [98 %-100 %] 100 % (11/04 0540) Physical Exam: General: Patient sleeping comfortably in bed, in no acute distress. Cardiovascular: RRR, no murmurs auscultated  Respiratory: good air movement throughout all lung fields, lungs clear to auscultation bilaterally  Abdomen: soft, nontender, presence of active bowel sounds  Extremities: distal left pulse intact, no LLE edema noted, right fracture boot in place Derm: skin warm and dry to touch, no rashes or lesions noted Neuro: easily arouable to voice Psych: mood appropriate   Laboratory: Recent Labs  Lab 06/14/20 1530 06/17/20 0751  WBC 4.8 4.2  HGB 11.8* 11.7*  HCT 36.1* 35.4*  PLT 150 152   Recent Labs  Lab 06/14/20 1530 06/17/20 0751  NA 137 141  K 4.1 4.0  CL 101 104  CO2 28 28  BUN 17 22  CREATININE 1.38* 1.44*  CALCIUM 8.9 9.1  GLUCOSE 109* 82      Imaging/Diagnostic Tests: No results found.  [name], DO 06/20/2020, 6:21 AM PGY-1, [place] Intern pager: [phone], text pages welcome

## 2020-06-20 NOTE — Progress Notes (Signed)
Went to see Pt. Pt tried to wanders off 2 times while I was there. Pt has to be directed to go to his bed. Pt denies any complaints. Pt is anxious, cooperative, pleasant and oriented to place and person but not to time. Pt states month and year as Jan 2021. Pt has mumbling speech which is difficult to understand. This seems like his baseline. 1:1 assistant reported that Pt tries to wanders off to stairs and to other pt.s room. Nurse also reported that Pt tries to wanders off often. Told the nurse to give him PRN Hydroxyzine to help him sleep and relieve his anxiety.

## 2020-06-20 NOTE — Progress Notes (Signed)
Interim progress note Responded to a page received by the RN that security had to be called to redirect the Pt to his bed. The nurse reported that she was helping the Pt walk around the unit , Pt didn't want to go to the bed and security had to be called to redirect him to bed. On examination, Pt was found lying on the bed comfortably and trying to sleep. Pt was easily redirectable. Nurse was asked to shut down the TV and dim the lights so that he can sleep. Pt already received vistaril to help him with sleep and anxiety.

## 2020-06-21 ENCOUNTER — Ambulatory Visit (HOSPITAL_COMMUNITY): Payer: Medicare Other

## 2020-06-21 DIAGNOSIS — I824Z1 Acute embolism and thrombosis of unspecified deep veins of right distal lower extremity: Secondary | ICD-10-CM | POA: Diagnosis not present

## 2020-06-21 DIAGNOSIS — G4700 Insomnia, unspecified: Secondary | ICD-10-CM | POA: Insufficient documentation

## 2020-06-21 MED ORDER — TRAZODONE HCL 50 MG PO TABS
50.0000 mg | ORAL_TABLET | Freq: Every day | ORAL | Status: DC
  Administered 2020-06-21 – 2020-06-26 (×7): 50 mg via ORAL
  Filled 2020-06-21 (×6): qty 1

## 2020-06-21 NOTE — Progress Notes (Signed)
Interim progress note:  Got page from Nurse Verdene Lennert that Pt is up and wandering in the hallway and is difficult to redirect. Went to see Pt. He was very hard to redirect and was wandering in the hallway and to other Pt's room. Pt went to his room 3 times, opened the bathroom door and came out of his room and started wandering again in the hallway. After trying 3 times to redirect him, He finally sat down on his bed but didn't want to sleep.  Consulted with Pharmacist about putting trazodone to help him sleep. He agrees with the plan. He states if trazodone doesn't work then Ambien is also safe. Ordered Trazodone 50 mg QHS. Nurse informed.   Dr. Armando Reichert MD PGY1 [facility]

## 2020-06-21 NOTE — Plan of Care (Signed)
  Problem: Activity: Goal: Risk for activity intolerance will decrease Outcome: Progressing   Problem: Nutrition: Goal: Adequate nutrition will be maintained Outcome: Progressing   Problem: Coping: Goal: Level of anxiety will decrease Outcome: Progressing   Problem: Elimination: Goal: Will not experience complications related to bowel motility Outcome: Progressing Goal: Will not experience complications related to urinary retention Outcome: Progressing   Problem: Pain Managment: Goal: General experience of comfort will improve Outcome: Progressing   Problem: Safety: Goal: Ability to remain free from injury will improve Outcome: Progressing   Problem: Skin Integrity: Goal: Risk for impaired skin integrity will decrease Outcome: Progressing   Problem: Education: Goal: Utilization of techniques to improve thought processes will improve Outcome: Progressing Goal: Knowledge of the prescribed therapeutic regimen will improve Outcome: Progressing   Problem: Activity: Goal: Interest or engagement in leisure activities will improve Outcome: Progressing Goal: Imbalance in normal sleep/wake cycle will improve Outcome: Progressing

## 2020-06-21 NOTE — Progress Notes (Addendum)
Family Medicine Teaching Service Daily Progress Note Intern Pager: [phone]  Patient name: [name] Medical record number: [id number] Date of birth: 11-May-1951 Age: 69 y.o. Gender: male  Primary Care Provider: Danna Hefty, DO Consultants: Neurology, psychiatry, vascular surgery Code Status: Full  Pt Overview and Major Events to Date:  Admitted 10/19  Assessment and Plan: [name] [name] a 69 y.o.malepresenting with RLE swelling and erythema + aphasia. PMH is significant for recent right foot crush injury s/p ORIF, primary schizophrenia, HFpEF,HTN, HLD, BPH, tobacco abuse, GERD, history of hep C s/p Harvoniandhistory of hep B cleared. Medically stable for discharge, dispo pending.   RLE DVT PatientwithRLE swelling and erythema found to havechronic DVT involving the right peroneal veins without a cystic structure found in the popliteal fossa on DVT study 10/18.Treatment withDOAC for 3-6 months per vascular surgery. -rivaroxaban15mg  BID; transition to 20mg  qd on 06/28/20 - PT/OT recs, pending safe disposition to SNF  Schizophrenia Stable.History of schizophrenia with echolalia and mumbling speech at baseline. Patient is resident of [facility] but has history of wandering off; last seen at facility on 10/15. - psychiatryconsulted,no changes recommended. - s/p Prolixin (25 mg q14d) injection(11/5) - benztropine 1 mg BID -ramelteon 8 mg bedtime -hydroxyzine 25 mg prn bedtime for anxiety -psych consult placed regarding possibility of geriatric psych placement   Social needs Patientpreviouslyhomeless,recently staying at [facility] this is likely affecting his medication compliance. Pt lacks capacity and surrogate decision makers have been difficult to contact. May need to be in a living facility with a higher level of supervision, but placement to any facility has been difficult. Current plans are for pt to return to SNF  then [place] consult,greatlyappreciate involvement and assistancein touching base with wife and SNF placement.   HTN Normotensive this morning, BP 110/69.Home meds: Amlodipine 10mg  QD. -continue home Amlodipine - monitor BP  Right foot fracture H/o Lisfranc fracture s/p ORIF by [name] on 9/14. Saw [name] for follow up visit on 10/14 who recommended to continue with fracture boot with weight bearing as tolerated and follow up with repeat imaging in 4 weeks. Foot x-ray on admission notable for healing fractures of the second through fifth metatarsal. No interval change from the prior study. -Continue fracture boot - consider performing repeat imaging inpatient if patient remains in the hospital as this date is approaching   Tobacco use Patient experiencing occasional cravings.  -nicotine patch 14 mg  - continue to encourage smoking cessation   FEN/GI: regular diet  PPx: rivaroxaban   Disposition: medically stable, pending safe dispo, awaiting SNF placement        Subjective:  Overnight patient tried to wander multiple times during the night, after multiple attempts of redirection, he finally agreed to stay in his room and was given atarax and then trazadone 2 hours later to aid with sleep. Sitter present in the room during my encounter.   Objective: Temp:  [98.7 F (37.1 C)-98.9 F (37.2 C)] 98.7 F (37.1 C) (11/04 1938) Pulse Rate:  [75-76] 75 (11/04 1938) Resp:  [17] 17 (11/04 1938) BP: (102-110)/(65-69) 110/69 (11/04 1938) SpO2:  [100 %] 100 % (11/04 1938) Physical Exam: General: Patient sleeping comfortably, in no acute distress. Cardiovascular: RRR, no murmurs or gallops auscultated  Respiratory: lungs clear to auscultation bilaterally, good air movement throughout all lungs fields  Abdomen: soft, nontender, presence of active bowel sounds  Extremities: left distal pulse intact, no LLE edema noted, right fracture boot in place Derm:  skin warm  and dry to touch, no rashes or lesions noted Psych: no agitation or aggression noted   Laboratory: Recent Labs  Lab 06/14/20 1530 06/17/20 0751  WBC 4.8 4.2  HGB 11.8* 11.7*  HCT 36.1* 35.4*  PLT 150 152   Recent Labs  Lab 06/14/20 1530 06/17/20 0751  NA 137 141  K 4.1 4.0  CL 101 104  CO2 28 28  BUN 17 22  CREATININE 1.38* 1.44*  CALCIUM 8.9 9.1  GLUCOSE 109* 82      Imaging/Diagnostic Tests: No results found.  [name], DO 06/21/2020, 6:23 AM PGY-1, [place] Intern pager: [phone], text pages welcome

## 2020-06-21 NOTE — Progress Notes (Signed)
MD paged patient is refusing to go back to bed and wants to wonder in the halls. Arthor Captain LPN

## 2020-06-22 DIAGNOSIS — F203 Undifferentiated schizophrenia: Secondary | ICD-10-CM

## 2020-06-22 DIAGNOSIS — I824Z1 Acute embolism and thrombosis of unspecified deep veins of right distal lower extremity: Secondary | ICD-10-CM | POA: Diagnosis not present

## 2020-06-22 NOTE — Consult Note (Signed)
[facility] Face-to-Face Psychiatry Consult   Reason for Consult: ''schizophrenia placement issues. Consult requested at this time due to possibility of Geriatric Psychiatry placement to ensure patient has safe disposition. Greatly appreciate assistance and recommendations''.  Referring Physician: [name], DO  Patient Identification: [name] MRN:  [id number] Principal Diagnosis: Schizophrenia [facility]) Diagnosis:  Principal Problem:   Schizophrenia ([place]) Active Problems:   HYPERTENSION, BENIGN ESSENTIAL   BPH (benign prostatic hyperplasia)   Atrial fibrillation with RVR ([place])   Dislocation of tarsometatarsal joint   DVT (deep venous thrombosis) ([place])   Homeless   H/O noncompliance with medical treatment, presenting hazards to health   Cognitive impairment   Acute encephalopathy   Anxiety   Insomnia   Total Time spent with patient: 45 minutes  Subjective/Objective: Patient seen lying in bed and his sitter by the bedside. He is a poor historian and he is unable to communicate his need clearly due to Aphasia. Chart review revealed history of RLE swelling and erythema + aphasia. PMH is significant for recent right foot crush injury s/p ORIF, primary schizophrenia, HFpEF,HTN, HLD, BPH, tobacco abuse, GERD, history of hep C s/p Harvoni and history of hep B cleared. Today, patient is calm, cooperative, alert, he does not appear to be delusional, depressed or psychotic and denies self harming thoughts. He appears to be stable on current schizophrenic medications and as a result, he does not meet criteria for inpatient Geriatric admission.      Past Psychiatric History: Schizophrenia  Risk to Self:  No Risk to Others:   NO Prior Inpatient Therapy:  Unable to recall Prior Outpatient Therapy:   [facility], last visit 05/23/2020  Past Medical History:  Past Medical History:  Diagnosis Date  . Acute encephalopathy 04/23/2020  . Acute metabolic encephalopathy 02/14/625   . AKI (acute kidney injury) ([place]) 05/12/2020  . BPH (benign prostatic hyperplasia)   . Closed displaced fracture of fifth metatarsal bone of right foot   . Colon polyp 2010  . Crush injury   . Depression   . GERD (gastroesophageal reflux disease)   . Hepatitis C    "caught it when I had a blood transfusion"  . High cholesterol   . History of blood transfusion    "when I was young"  . Hypertension   . Paranoid schizophrenia ([place])   . PARANOID SCHIZOPHRENIA, CHRONIC 01/17/2009  . Prostate atrophy   . Retinal vein occlusion     Past Surgical History:  Procedure Laterality Date  . CIRCUMCISION  1959  . ORIF TOE FRACTURE Right 04/30/2020   Procedure: OPEN REDUCTION INTERNAL FIXATION (ORIF) RIGHT FOOT METATARSAL FRACTURES;  Surgeon: Newt Minion, MD;  Location: [facility];  Service: Orthopedics;  Laterality: Right;  . TONSILLECTOMY     Family History:  Family History  Problem Relation Age of Onset  . Hypertension Mother   . Diabetes Mother   . Stroke Mother    Family Psychiatric  History: Denies Social History:  Social History   Substance and Sexual Activity  Alcohol Use Yes     Social History   Substance and Sexual Activity  Drug Use No    Social History   Socioeconomic History  . Marital status: Married    Spouse name: Not on file  . Number of children: Not on file  . Years of education: Not on file  . Highest education level: Not on file  Occupational History  . Not on file  Tobacco Use  . Smoking  status: Current Every Day Smoker    Packs/day: 0.50    Years: 48.00    Pack years: 24.00    Types: Cigarettes  . Smokeless tobacco: Never Used  Substance and Sexual Activity  . Alcohol use: Yes  . Drug use: No  . Sexual activity: Yes  Other Topics Concern  . Not on file  Social History Narrative  . Not on file   Social Determinants of Health   Financial Resource Strain:   . Difficulty of Paying Living Expenses: Not on file  Food Insecurity:   . Worried  About Charity fundraiser in the Last Year: Not on file  . Ran Out of Food in the Last Year: Not on file  Transportation Needs:   . Lack of Transportation (Medical): Not on file  . Lack of Transportation (Non-Medical): Not on file  Physical Activity:   . Days of Exercise per Week: Not on file  . Minutes of Exercise per Session: Not on file  Stress:   . Feeling of Stress : Not on file  Social Connections:   . Frequency of Communication with Friends and Family: Not on file  . Frequency of Social Gatherings with Friends and Family: Not on file  . Attends Religious Services: Not on file  . Active Member of Clubs or Organizations: Not on file  . Attends Archivist Meetings: Not on file  . Marital Status: Not on file   Additional Social History:    Allergies:   Allergies  Allergen Reactions  . Lisinopril Other (See Comments)    Cough  . Penicillins Hives  . Amoxicillin Rash  . Ibuprofen Rash    Labs:  No results found for this or any previous visit (from the past 48 hour(s)).  Current Facility-Administered Medications  Medication Dose Route Frequency Provider Last Rate Last Admin  . acetaminophen (TYLENOL) tablet 650 mg  650 mg Oral Q6H PRN Mullis, Kiersten P, DO   650 mg at 06/20/20 2146   Or  . acetaminophen (TYLENOL) suppository 650 mg  650 mg Rectal Q6H PRN Mullis, Kiersten P, DO      . amLODipine (NORVASC) tablet 10 mg  10 mg Oral Daily Mullis, Kiersten P, DO   10 mg at 06/22/20 1100  . benztropine (COGENTIN) tablet 1 mg  1 mg Oral BID Mullis, Kiersten P, DO   1 mg at 06/22/20 1100  . famotidine (PEPCID) tablet 20 mg  20 mg Oral BID Mullis, Kiersten P, DO   20 mg at 06/22/20 1100  . fluPHENAZine decanoate (PROLIXIN) injection 25 mg  25 mg Intramuscular Q14 Days Benay Pike, MD   25 mg at 06/21/20 1125  . folic acid (FOLVITE) tablet 1 mg  1 mg Oral Daily Mullis, Kiersten P, DO   1 mg at 06/22/20 1100  . hydrOXYzine (ATARAX/VISTARIL) tablet 25 mg  25 mg Oral QHS  PRN Armando Reichert, MD   25 mg at 06/20/20 2147  . nicotine (NICODERM CQ - dosed in mg/24 hours) patch 14 mg  14 mg Transdermal Daily Milus Banister C, DO   14 mg at 06/22/20 1100  . nicotine polacrilex (NICORETTE) gum 2 mg  2 mg Oral PRN Milus Banister C, DO   2 mg at 06/18/20 2312  . ramelteon (ROZEREM) tablet 8 mg  8 mg Oral QHS Lattie Haw, MD   8 mg at 06/21/20 2144  . Rivaroxaban (XARELTO) tablet 15 mg  15 mg Oral BID Briant Cedar, MD  15 mg at 06/22/20 0900   Followed by  . [START ON 06/28/2020] rivaroxaban (XARELTO) tablet 20 mg  20 mg Oral Q supper Briant Cedar, MD      . tamsulosin [facility]) capsule 0.8 mg  0.8 mg Oral Daily Mullis, Kiersten P, DO   0.8 mg at 06/22/20 1100  . traZODone (DESYREL) tablet 50 mg  50 mg Oral QHS Armando Reichert, MD   50 mg at 06/21/20 2144    Musculoskeletal: Strength & Muscle Tone: within normal limits Gait & Station: normal Patient leans: N/A  Psychiatric Specialty Exam: Physical Exam  Review of Systems  Blood pressure 121/79, pulse 69, temperature 97.6 F (36.4 C), resp. rate 18, height 5\' 9"  (1.753 m), weight 64 kg, SpO2 100 %.Body mass index is 20.84 kg/m.  General Appearance: Casual  Eye Contact:  Fair  Speech:  Slurred and broken, delayed pauses in between speech. History of echolalia.   Volume:  Normal  Mood:  Dysphoric  Affect:  Appropriate  Thought Process:  Coherent, Linear and Descriptions of Associations: Circumstantial  Orientation:  Other:  A/O x 2  Thought Content:  Logical, Rumination and Perserveration  Suicidal Thoughts:  No  Homicidal Thoughts:  No  Memory:  Immediate;   Fair Recent;   Poor Remote;   Poor  Judgement:  Other:  appropriate, appears to be at his baseline  Insight:  Shallow  Psychomotor Activity:  Normal  Concentration:  Concentration: Fair and Attention Span: Fair  Recall:  Poor  Fund of Knowledge:  Poor  Language:  Poor  Akathisia:  Yes  Handed:  Right  AIMS (if indicated):      Assets:  Communication Skills Desire for Improvement  ADL's:  Impaired  Cognition:  Impaired,  Mild  Sleep:        Treatment Plan Summary: 69 year old male with long history of Schizophrenia who is currently stable on his medications. Patient appears to be at baseline mentally and does not meet criteria for inpatient Geriatric admission-lack of any acute psychiatric symptoms to warrant inpatient admission.  Recommendations: -Social worker consult to refer patient back to his ACT team when patient is discharge and for SNF placement -Continue current psychiatric medications  Disposition: No evidence of imminent risk to self or others at present.   Patient does not meet criteria for psychiatric inpatient admission. Psychiatric service siging out. Re-consult as needed  Corena Pilgrim, MD 06/22/2020 1:28 PM

## 2020-06-22 NOTE — Progress Notes (Signed)
Family Medicine Teaching Service Daily Progress Note Intern Pager: [phone]  Patient name: [name] Medical record number: [id number] Date of birth: February 01, 1951 Age: 69 y.o. Gender: male  Primary Care Provider: Danna Hefty, DO Consultants: Neurology, psychiatry, vascular surgery Code Status: Full  Pt Overview and Major Events to Date:  Admitted 10/19  Assessment and Plan: [name] a 69 y.o.malepresenting with RLE swelling and erythema + aphasia. PMH is significant for recent right foot crush injury s/p ORIF, primary schizophrenia, HFpEF,HTN, HLD, BPH, tobacco abuse, GERD, history of hep C s/p Harvoniandhistory of hep B cleared. Medically stable for discharge, dispo pending.   RLE DVT PatientwithRLE swelling and erythema found to havechronic DVT involving the right peroneal veins without a cystic structure found in the popliteal fossa on DVT study 10/18.Treatment withDOAC for 3-6 months per vascular surgery. -rivaroxaban15mg  BID; transition to 20mg  qd on 06/28/20 - PT/OT recs, pending safe disposition to SNF  Schizophrenia Stable.History of schizophrenia with echolalia and mumbling speech at baseline. Patient is resident of [facility] but has history of wandering off; last seen at facility on 10/15. - psychiatryconsulted,no changes recommended. - s/p Prolixin (25 mg q14d) injection(11/5) , Next due 11/19 - benztropine 1 mg BID -ramelteon 8 mg bedtime -hydroxyzine 25 mg prn bedtime for anxiety -psych consult was placed yesterday regarding possibility of geriatric psych placement.  Social needs Patientpreviouslyhomeless,recently staying at [facility] this is likely affecting his medication compliance. Pt lacks capacity and surrogate decision makers have been difficult to contact. Current plans are for pt to return to SNF then [facility]. -CSW notes from 11/4- CSW spoke with Clinical research associate, [name]. She confirmed that it is a possibility for pt to return but would need to be discussed closer to d/c. CSW explained recommendation for pt to go to SNF and how that would require pt's SSI check. [name] explained that pt's check would still be need to hold bed at ALF. -Follow up with CSW. For SNF placement.  HTN Most recent , BP 121/57mmHg.Home meds: Amlodipine 10mg  QD. -continue home Amlodipine - monitor BP  Right foot fracture H/o Lisfranc fracture s/p ORIF by Dr. Sharol Lowe on 9/14. Saw Dr. Sharol Lowe for follow up visit on 10/14 who recommended to continue with fracture boot with weight bearing as tolerated and follow up with repeat imaging in 4 weeks. Foot x-ray on admission notable for healing fractures of the second through fifth metatarsal. No interval change from the prior study. -Continue fracture boot - Consider repeat imaging inpatient if patient remains in the hospital as this date is approaching. 4 weeks will be on 06/20/20. We'll repeat X ray if Pt is still here on 11/4.  Tobacco use Patient experiencing occasional cravings.  -nicotine patch 14 mg  - continue to encourage smoking cessation  Insomnia. Pt had difficulty in sleeping on the night of 06/21/2020. - ContinueTrazodone 50 mg QHS -Hydroxyzine 25 mg PRN QHS  FEN/GI: regular diet  PPx: rivaroxaban  Disposition: medically stable, pending safe dispo, awaiting SNF placement    Subjective: Pt was anxious and wandering out last night. Pt slept well last night. No new complaints.   Objective: Temp:  [97.6 F (36.4 C)] 97.6 F (36.4 C) (11/05 2021) Pulse Rate:  [69] 69 (11/05 2021) Resp:  [18] 18 (11/05 2021) BP: (121)/(79) 121/79 (11/05 2021) SpO2:  [100 %] 100 % (11/05 2021) Physical Exam: General: Pt laying in his bed. Sleeping comfortably. Cardiovascular: S1 S2 normal, RRR, no murmurs or  gallops auscultated  Respiratory: lungs clear to auscultation bilaterally Abdomen: soft, nontender, presence of active  bowel sounds  Extremities: pulses intact, no LLE edema noted, right fracture boot in place Derm: skin warm and dry to touch, no rashes or lesions noted Psych: no agitation or aggression noted   Laboratory: Recent Labs  Lab 06/17/20 0751  WBC 4.2  HGB 11.7*  HCT 35.4*  PLT 152   Recent Labs  Lab 06/17/20 0751  NA 141  K 4.0  CL 104  CO2 28  BUN 22  CREATININE 1.44*  CALCIUM 9.1  GLUCOSE 82     Imaging/Diagnostic Tests: No new results  Corey Reichert, MD 06/22/2020, 4:05 AM PGY-1, [facility] Intern pager: [phone], text pages welcome

## 2020-06-23 DIAGNOSIS — I824Z1 Acute embolism and thrombosis of unspecified deep veins of right distal lower extremity: Secondary | ICD-10-CM | POA: Diagnosis not present

## 2020-06-23 NOTE — Progress Notes (Signed)
Family Medicine Teaching Service Daily Progress Note Intern Pager: [phone]  Patient name: [name] Medical record number: [id number] Date of birth: 1950/10/21 Age: 69 y.o. Gender: male  Primary Care Provider: Danna Hefty, DO Consultants: Neurology, psychiatry, vascular surgery Code Status: Full  Pt Overview and Major Events to Date:  Admitted 10/19  Assessment and Plan: Jaime Grizzell Iveryis a 69 y.o.malepresenting with RLE swelling and erythema + aphasia. PMH is significant for recent right foot crush injury s/p ORIF, primary schizophrenia, HFpEF,HTN, HLD, BPH, tobacco abuse, GERD, history of hep C s/p Harvoniandhistory of hep B cleared.  Medically stable for discharge pending SNF bed availability  RLE DVT PatientwithRLE swelling and erythema found to havechronic DVT involving the right peroneal veins without a cystic structure found in the popliteal fossa on DVT study 10/18.Treatment withDOAC for 3-6 months per vascular surgery. -rivaroxaban15mg  BID; transition to 20mg  qd on 06/28/20 - PT/OT recs, pending safe disposition to SNF  Schizophrenia History of schizophrenia with echolalia and mumbling speech at baseline. Patient is resident of [facility] but has history of wandering off; last seen at facility on 10/15. Patient still having some issues with insomnia.  Per nursing slept about 3 hours last night. -Psych consult, appreciate recommendations:  -Social work consult to refer patient back to his ACT team when patient is discharged  -Continue current psychiatric medications - s/p Prolixin (25 mg q14d) injection(11/5) , Next due 11/19 - benztropine 1 mg BID -ramelteon 8 mg bedtime -hydroxyzine 25 mg prn bedtime for anxiety  Social needs Patientpreviouslyhomeless,recently staying at [facility] this is likely affecting his medication compliance. Pt lacks capacity and surrogate decision makers have been difficult to  contact. Current plans are for pt to return to SNF then [place]. -CSW notes from 11/4- CSW spoke with Clinical research associate, Damien Fusi. She confirmed that it is a possibility for pt to return but would need to be discussed closer to d/c. CSW explained recommendation for pt to go to SNF and how that would require pt's SSI check. Hazel explained that pt's check would still be need to hold bed at ALF. -Follow-up with CSW for SNF placement  HTN Most recent , BP 107/65.Home meds: Amlodipine 10mg  QD. -continue home Amlodipine - monitor BP  Right foot fracture H/o Lisfranc fracture s/p ORIF by [name] on 9/14. Saw [name] for follow up visit on 10/14 who recommended to continue with fracture boot with weight bearing as tolerated and follow up with repeat imaging in 4 weeks. Foot x-ray on admission notable for healing fractures of the second through fifth metatarsal. No interval change from the prior study. -Continue fracture boot -Repeat x-ray ordered for 11/11.  Tobacco use Patient experiencing occasional cravings.  -nicotine patch 14 mg  - continue to encourage smoking cessation  Insomnia. Pt had difficulty in sleeping on the night of 06/21/2020.  Per nursing staff he did only sleep about 3 hours last night and did pace the halls with assistance on a few occasions but did not cause any problems otherwise.  Per documentation his sleep had been improving after the medication changes.  We will keep medications the same for now to evaluate if he has further issues with sleep, if this is the case can consider medication changes. - Continue Trazodone 50 mg QHS - Hydroxyzine 25 mg PRN QHS - Ramelteon 8 mg at bedtime  FEN/GI: regular diet  PPx: rivaroxaban  Disposition: Medically stable for discharge, awaiting SNF placement.  Subjective:  Patient denies complaints this morning.  Per nursing he only slept about 3 hours last night and did want to wander a few laps around the unit  but did not cause any disturbances.  Patient pleasant on encounter  Objective: Temp:  [97.7 F (36.5 C)-98 F (36.7 C)] 98 F (36.7 C) (11/06 2037) Pulse Rate:  [86-87] 87 (11/06 2037) Resp:  [18-20] 18 (11/06 2037) BP: (101-107)/(46-65) 107/65 (11/06 2037) SpO2:  [100 %] 100 % (11/06 2037) Physical Exam: General: Alert and oriented to person, place, time and in no apparent distress Heart: Regular rate and rhythm with no murmurs appreciated Lungs: CTA bilaterally, no wheezing Abdomen: Bowel sounds present, no abdominal pain Extremities: No lower extremity edema   Laboratory: Recent Labs  Lab 06/17/20 0751  WBC 4.2  HGB 11.7*  HCT 35.4*  PLT 152   Recent Labs  Lab 06/17/20 0751  NA 141  K 4.0  CL 104  CO2 28  BUN 22  CREATININE 1.44*  CALCIUM 9.1  GLUCOSE 82     Imaging/Diagnostic Tests: No new results  Lurline Del, DO 06/23/2020, 12:34 AM PGY-2, [facility] Intern pager: [phone], text pages welcome

## 2020-06-24 DIAGNOSIS — I824Z1 Acute embolism and thrombosis of unspecified deep veins of right distal lower extremity: Secondary | ICD-10-CM | POA: Diagnosis not present

## 2020-06-24 NOTE — Progress Notes (Addendum)
Family Medicine Teaching Service Daily Progress Note Intern Pager: [phone]  Patient name: [name] Medical record number: [id number] Date of birth: July 15, 1951 Age: 69 y.o. Gender: male  Primary Care Provider: Danna Hefty, DO Consultants: Neurology, psychiatry, vascular surgery Code Status: Full  Admitted 10/19  Assessment and Plan: [name] a 69 y.o.malepresenting with RLE swelling and erythema + aphasia. PMH is significant for recent right foot crush injury s/p ORIF, primary schizophrenia, HFpEF,HTN, HLD, BPH, tobacco abuse, GERD, history of hep C s/p Harvoniandhistory of hep B cleared.  Medically stable for discharge pending SNF bed availability  RLE DVT Denies any complaints and pain. Rt foot in Boot. PatientwithRLE swelling and erythema found to havechronic DVT involving the right peroneal veins without a cystic structure found in the popliteal fossa on DVT study 10/18.Treatment withDOAC for 3-6 months per vascular surgery. -rivaroxaban15mg  BID; transition to 20mg  qd on 06/28/20 - PT/OT recs, pending safe disposition to SNF  Schizophrenia History of schizophrenia with echolalia and mumbling speech at baseline. Patient is resident of [facility] but has history of wandering off; last seen at facility on 10/15.Nurse tech reported that Pt didn't wander off last night and slept well.  -Psych consult, appreciate recommendations:             -Social work consult to refer patient back to his ACT team when patient is discharged.             -Continue current psychiatric medications - s/p Prolixin (25 mg q14d) injection(11/5) , Next due 11/19 - benztropine 1 mg BID -ramelteon 8 mg bedtime -hydroxyzine 25 mg prn bedtime for anxiety  Social needs Patientpreviouslyhomeless,recently staying at [facility] this is likely affecting his medication compliance. Pt lacks capacity and surrogate decision makers have been  difficult to contact. Current plans are for pt to return to SNF then [facility]. -CSW notes from 11/4- CSW spoke with Clinical research associate, Damien Fusi. She confirmed that it is a possibility for pt to return but would need to be discussed closer to d/c. CSW explained recommendation for pt to go to SNF and how that would require pt's SSI check. Hazel explained that pt's check would still be need to hold bed at ALF. -Follow-up with CSW for SNF placement.   HTN Most recent , BP 124/68mmHG .Recent BP's have been stable. Home meds: Amlodipine 10mg  QD. -continue home Amlodipine - monitor BP  Right foot fracture H/o Lisfranc fracture s/p ORIF by [name] on 9/14. Saw [name] for follow up visit on 10/14 who recommended to continue with fracture boot with weight bearing as tolerated and follow up with repeat imaging in 4 weeks. Foot x-ray on admission notable for healing fractures of the second through fifth metatarsal. No interval change from the prior study. -Continue fracture boot.  -Repeat x-ray on 11/11(ordered).  Tobacco use Patient experiencing occasional cravings.  -Continue nicotine patch 14 mg  -continue toencourage smoking cessation  Insomnia. Nurse reported that he didn't wander off last night. He slept well. His sleep had been improving after the medication changes.  We will keep medications the same for now to evaluate if he has further issues with sleep, if this is the case can consider medication changes. - Continue Trazodone 50 mg QHS - Hydroxyzine 25 mg PRN QHS - Ramelteon 8 mg at bedtime  FEN/GI:regular diet [id number]  Disposition: Medically stable for discharge, awaiting SNF placement.   Subjective: Pt sitting on bed and eating his breakfast. He  appears pleasant on encounter. Denies any complaints. Slept well last night.   Objective: Temp:  [97.9 F (36.6 C)-98.2 F (36.8 C)] 97.9 F (36.6 C) (11/08 0646) Pulse Rate:  [79-91] 88 (11/08  0646) Resp:  [18-19] 19 (11/08 0646) BP: (117-132)/(68-77) 132/68 (11/08 0646) SpO2:  [92 %-99 %] 94 % (11/08 0646) Physical Exam: General: He has mumbling difficult to understand speech. Alert, not oriented to place, person and time. He is in no acute distress. Cardiovascular: Regular rate and rhythm with no murmurs appreciated Respiratory: Breath sounds clear, No wheezing, rales, rhonchi. Abdomen: No abdominal swelling, No abdominal pain. Extremities: No lower extremity edema or redness.[name] examine Rt foot because of boot.   Laboratory: No results for input(s): WBC, HGB, HCT, PLT in the last 168 hours. No results for input(s): NA, K, CL, CO2, BUN, CREATININE, CALCIUM, PROT, BILITOT, ALKPHOS, ALT, AST, GLUCOSE in the last 168 hours.  Invalid input(s): LABALBU   Imaging/Diagnostic Tests: Now New results.  Armando Reichert, MD 06/24/2020, 9:09 AM PGY1, [facility] Intern pager: [phone], text pages welcome

## 2020-06-24 NOTE — Progress Notes (Signed)
Occupational Therapy Treatment + Discharge Patient Details Name: [name] MRN: [id number] DOB: 29-Sep-1950 Today's Date: 06/24/2020    History of present illness Pt is a 69 y/o male admitted from ALF on 06/04/20 secondary to RLE swelling; (+) RLE DVT and started on lovenox. Of note, pt with recent R foot fx and is s/p ORIF. Other PMH includes schizophrenia, HTN, tobacco use, hep C.   OT comments  Pt at functional baseline for ADL with supervisionA and supervisionA for functional ADL mobility with RW or no AD. Pt encouraged to ambulate with sitter often to avoid restlessness. Pt following all commands with multimodal cues. Appropriate goals achieved. Recommend pt d/c to a  facility with 24/7     Follow Up Recommendations  Supervision/Assistance - 24 hour (return to ALF vs SNF)    Equipment Recommendations  None recommended by OT    Recommendations for Other Services      Precautions / Restrictions Precautions Precautions: Fall Restrictions Weight Bearing Restrictions: Yes RLE Weight Bearing: Weight bearing as tolerated Other Position/Activity Restrictions: in cam walker boot       Mobility Bed Mobility Overal bed mobility: Modified Independent             General bed mobility comments: no physical assist  Transfers Overall transfer level: Needs assistance Equipment used: None Transfers: Sit to/from Stand Sit to Stand: Supervision         General transfer comment: Pt does not require physical assist    Balance Overall balance assessment: Needs assistance Sitting-balance support: Feet supported;No upper extremity supported Sitting balance-Leahy Scale: Good       Standing balance-Leahy Scale: Fair                             ADL either performed or assessed with clinical judgement   ADL Overall ADL's : Needs assistance/impaired Eating/Feeding: Set up;Sitting Eating/Feeding Details (indicate cue type and reason): needed assist opening  containers and cutting up food Grooming: Supervision/safety;Standing Grooming Details (indicate cue type and reason): given materials; pt with cues able to brush teeth and comb hair             Lower Body Dressing: Supervision/safety;Sitting/lateral leans;Sit to/from stand   Toilet Transfer: Supervision/safety;Ambulation           Functional mobility during ADLs: Minimal assistance;Cueing for safety;Cueing for sequencing General ADL Comments: Pt tolerating session well with consistently following multimodal commands.     Vision   Vision Assessment?: No apparent visual deficits   Perception     Praxis      Cognition Arousal/Alertness: Awake/alert Behavior During Therapy: Impulsive Overall Cognitive Status: History of cognitive impairments - at baseline Area of Impairment: Attention;Following commands;Memory;Safety/judgement;Awareness;Problem solving                 Orientation Level: Situation;Time;Place;Disoriented to Current Attention Level: Sustained Memory: Decreased short-term memory Following Commands: Follows one step commands with increased time;Follows one step commands inconsistently Safety/Judgement: Decreased awareness of safety;Decreased awareness of deficits Awareness: Intellectual Problem Solving: Requires verbal cues General Comments: Pt able to communicate with gestures, but mostly repeats what has been said and stutters        Exercises     Shoulder Instructions       General Comments Sitter in room    Pertinent Vitals/ Pain       Pain Assessment: No/denies pain Pain Score: 0-No pain Faces Pain Scale: No hurt Pain Intervention(s): Monitored during session  Home Living                                          Prior Functioning/Environment              Frequency  Min 2X/week        Progress Toward Goals  OT Goals(current goals can now be found in the care plan section)  Progress towards OT goals:  Goals met/education completed, patient discharged from OT  Acute Rehab OT Goals Patient Stated Goal: No goals stated OT Goal Formulation: All assessment and education complete, DC therapy Time For Goal Achievement: 07/03/20 Potential to Achieve Goals: Good ADL Goals Pt Will Perform Grooming: with supervision;standing Pt Will Perform Upper Body Dressing: with set-up;sitting Pt Will Perform Lower Body Dressing: with supervision;sitting/lateral leans;sit to/from stand Pt Will Transfer to Toilet: with supervision;ambulating;regular height toilet Pt Will Perform Toileting - Clothing Manipulation and hygiene: with min guard assist;sit to/from stand Additional ADL Goal #1: Pt will follow one step commands with min VC's Additional ADL Goal #2: Pt will reduce risk of falls by recalling 3/5 unsafe actions during ADL with mod VCs  Plan Discharge plan needs to be updated;All goals met and education completed, patient discharged from OT services    Co-evaluation                 AM-PAC OT "6 Clicks" Daily Activity     Outcome Measure   Help from another person eating meals?: A Little Help from another person taking care of personal grooming?: A Little Help from another person toileting, which includes using toliet, bedpan, or urinal?: A Little Help from another person bathing (including washing, rinsing, drying)?: A Little Help from another person to put on and taking off regular upper body clothing?: A Little Help from another person to put on and taking off regular lower body clothing?: A Little 6 Click Score: 18    End of Session Equipment Utilized During Treatment: Gait belt  OT Visit Diagnosis: Unsteadiness on feet (R26.81);Muscle weakness (generalized) (M62.81)   Activity Tolerance Patient tolerated treatment well   Patient Left in chair;with call bell/phone within reach;with nursing/sitter in room   Nurse Communication Mobility status        Time: 1150-1210 OT Time  Calculation (min): 20 min  Charges: OT General Charges $OT Visit: 1 Visit OT Treatments $Self Care/Home Management : 8-22 mins  Jefferey Pica, OTR/L Acute Rehabilitation Services Pager: [phone] Office: [phone]    Jerene Pitch 06/24/2020, 2:33 PM

## 2020-06-25 DIAGNOSIS — I824Z1 Acute embolism and thrombosis of unspecified deep veins of right distal lower extremity: Secondary | ICD-10-CM | POA: Diagnosis not present

## 2020-06-25 NOTE — Progress Notes (Signed)
Physical Therapy Treatment Patient Details Name: [name] MRN: [id number] DOB: 1951/02/22 Today's Date: 06/25/2020    History of Present Illness Pt is a 69 y/o male admitted from ALF on 06/04/20 secondary to RLE swelling; (+) RLE DVT and started on lovenox. Of note, pt with recent R foot fx and is s/p ORIF. Other PMH includes schizophrenia, HTN, tobacco use, hep C.    PT Comments    Patient progressing towards physical therapy goals. Patient ambulated 500' with no AD and min guard, patient tends to drift L/R throughout. Cues required for wayfinding to room,however patient unable to locate room. Patient continues to be limited by impaired balance, impaired cognition, generalized weakness. Continue to recommend SNF for ongoing Physical Therapy.       Follow Up Recommendations  SNF;Supervision/Assistance - 24 hour (return to ALF)     Equipment Recommendations  None recommended by PT    Recommendations for Other Services       Precautions / Restrictions Precautions Precautions: Fall Restrictions Weight Bearing Restrictions: Yes RLE Weight Bearing: Weight bearing as tolerated Other Position/Activity Restrictions: in cam [place] boot    Mobility  Bed Mobility Overal bed mobility: Modified Independent             General bed mobility comments: no physical assist  Transfers Overall transfer level: Needs assistance Equipment used: None Transfers: Sit to/from Stand Sit to Stand: Supervision            Ambulation/Gait Ambulation/Gait assistance: Min guard Gait Distance (Feet): 500 Feet Assistive device: None Gait Pattern/deviations: Step-through pattern;Decreased stride length;Trunk flexed;Staggering left Gait velocity: Decreased   General Gait Details: min guard for safety, unable to find room with cues    Stairs             Wheelchair Mobility    Modified Rankin (Stroke Patients Only)       Balance Overall balance assessment: Needs  assistance Sitting-balance support: Feet supported;No upper extremity supported Sitting balance-Leahy Scale: Good     Standing balance support: No upper extremity supported;During functional activity Standing balance-Leahy Scale: Fair                              Cognition Arousal/Alertness: Awake/alert Behavior During Therapy: Impulsive Overall Cognitive Status: History of cognitive impairments - at baseline Area of Impairment: Attention;Following commands;Memory;Safety/judgement;Awareness;Problem solving                 Orientation Level: Situation;Time;Place;Disoriented to Current Attention Level: Sustained Memory: Decreased short-term memory Following Commands: Follows one step commands with increased time;Follows one step commands inconsistently Safety/Judgement: Decreased awareness of safety;Decreased awareness of deficits Awareness: Intellectual Problem Solving: Requires verbal cues General Comments: Pt able to communicate with gestures, but mostly repeats what has been said and stutters      Exercises      General Comments        Pertinent Vitals/Pain Pain Assessment: Faces Faces Pain Scale: No hurt Pain Intervention(s): Monitored during session    Home Living                      Prior Function            PT Goals (current goals can now be found in the care plan section) Acute Rehab PT Goals Patient Stated Goal: No goals stated PT Goal Formulation: With patient Time For Goal Achievement: 07/04/20 Potential to Achieve Goals: Fair Progress towards PT goals: Progressing  toward goals    Frequency    Min 2X/week      PT Plan Current plan remains appropriate    Co-evaluation              AM-PAC PT "6 Clicks" Mobility   Outcome Measure  Help needed turning from your back to your side while in a flat bed without using bedrails?: None Help needed moving from lying on your back to sitting on the side of a flat bed  without using bedrails?: None Help needed moving to and from a bed to a chair (including a wheelchair)?: A Little Help needed standing up from a chair using your arms (e.g., wheelchair or bedside chair)?: A Little Help needed to walk in hospital room?: A Little Help needed climbing 3-5 steps with a railing? : A Little 6 Click Score: 20    End of Session Equipment Utilized During Treatment: Gait belt Activity Tolerance: Patient tolerated treatment well Patient left: in bed;with nursing/sitter in room Nurse Communication: Mobility status PT Visit Diagnosis: Unsteadiness on feet (R26.81);Muscle weakness (generalized) (M62.81);Other symptoms and signs involving the nervous system (R29.898)     Time: 1325-1340 PT Time Calculation (min) (ACUTE ONLY): 15 min  Charges:  $Therapeutic Activity: 8-22 mins                     Perrin Maltese, PT, DPT Acute Rehabilitation Services Pager [phone] Office [phone]    Chesley Noon K Allred 06/25/2020, 1:50 PM

## 2020-06-25 NOTE — Progress Notes (Signed)
Family Medicine Teaching Service Daily Progress Note Intern Pager: [phone]  Patient name: [name] Medical record number: [id number] Date of birth: [birth year] Age: 69 y.o. Gender: male  Primary Care Provider: Danna Hefty, DO Consultants: Neurology, psychiatry, vascular surgery Code Status: Full Pt Overview and Major Events to Date:  Admitted 10/19  Assessment and Plan: Corey Martensen Iveryis a 69 y.o.malepresenting with RLE swelling and erythema + aphasia. PMH is significant for recent right foot crush injury s/p ORIF, primary schizophrenia, HFpEF,HTN, HLD, BPH, tobacco abuse, GERD, history of hep C s/p Harvoniandhistory of hep B cleared.Medically stable for discharge pending SNF bed availability  RLE DVT Denies any complaints and pain. Rt foot in Boot. PatientwithRLE swelling and erythema found to havechronic DVT involving the right peroneal veins without a cystic structure found in the popliteal fossa on DVT study 10/18.Treatment withDOAC for 3-6 months per vascular surgery. -rivaroxaban15mg  BID; transition to 20mg  qd on 06/28/20 - PT/OT recs, pending safe disposition to AL.  Schizophrenia History of schizophrenia with echolalia and mumbling speech at baseline. Patient is resident of [facility] but has history of wandering off; last seen at facility on 10/15.Nurse reported that Pt tried to wander last night but was easily redirectable. Pt Schizophrenia is stable. No dangerous behaviors on the unit.  -Continue current psychiatric medications - s/p Prolixin (25 mg q14d) injection(11/5) , Next due 11/19 - benztropine 1 mg BID -ramelteon 8 mg bedtime -hydroxyzine 25 mg prn bedtime for anxiety  Social needs Patientpreviouslyhomeless,recently staying at [place] this is likely affecting his medication compliance. Pt lacks capacity and surrogate decision makers have been difficult to contact. Current plans are for pt to  return to SNF then [place] with CSW yesterday- Pt will likely not go to SNF because of wandering issues and also they would need SS check . Pt already using SS check for AL bed. IF Pt uses SS check for SNF, he will loose AL bed. CSW can try sending him back to AL.   HTN Most recent , BP 141/85 mmHG.Recent BP's have been stable. Home meds: Amlodipine 10mg  QD. -continue home Amlodipine - monitor BP  Right foot fracture H/o Lisfranc fracture s/p ORIF by [name] on 9/14. Saw [name] for follow up visit on 10/14 who recommended to continue with fracture boot with weight bearing as tolerated and follow up with repeat imaging in 4 weeks. Foot x-ray on admission notable for healing fractures of the second through fifth metatarsal. No interval change from the prior study. -Continue fracture boot.  -Repeat x-ray on 11/11(already ordered).  Tobacco use Patient experiencing occasional cravings.  -Continue nicotine patch 14 mg  -continue toencourage smoking cessation  Insomnia. Nurse reported that Pt tried to wander last night but was easily redirectable. He slept well. His sleep had been improving after the medication changes. We will keep medications the same for now to evaluate if he has further issues with sleep, if this is the case can consider medication changes. - Continue Trazodone 50 mg QHS - Hydroxyzine 25 mg PRN QHS - Ramelteon 8 mg at bedtime  FEN/GI:regular diet  FBP:[id number]  Disposition:Medically stable for discharge, awaiting SNF placement. Subjective: Pt laying down on the bed. He appears pleasant on encounter. Denies any complaints. Slept well last night.   Objective: Temp:  [97.7 F (36.5 C)-98.6 F (37 C)] 98.6 F (37 C) (11/09 0537) Pulse Rate:  [60-71] 60 (11/09 0537) Resp:  [18-20] 18 (11/09 0537) BP: (114-141)/(65-85) 141/85 (11/08  2310) SpO2:  [98 %-100 %] 100 % (11/09 0537) Physical Exam: General: He has mumbling difficult to  understand speech. Alert, oriented to place and person. Pt knows year but not month and date. He is in no acute distress. Cardiovascular: RRR, no murmurs appreciated. Respiratory: Breath sounds clear, No wheezing, rales, rhonchi. Abdomen: No abdominal swelling, No abdominal pain. BS+nt Extremities: No lower extremity edema or redness.[name] examine Rt foot because of boot.   Laboratory: No results for input(s): WBC, HGB, HCT, PLT in the last 168 hours. No results for input(s): NA, K, CL, CO2, BUN, CREATININE, CALCIUM, PROT, BILITOT, ALKPHOS, ALT, AST, GLUCOSE in the last 168 hours.  Invalid input(s): LABALBU   Imaging/Diagnostic Tests: No New results.  Corey Reichert, MD 06/25/2020, 6:57 AM PGY-1, [place] Intern pager: [phone], text pages welcome

## 2020-06-25 NOTE — TOC Progression Note (Signed)
Transition of Care [facility]) - Progression Note    Patient Details  Name: SHADEN LACHER MRN: [id number] Date of Birth: 04/08/1951  Transition of Care [facility]) CM/SW [place], [place] Phone Number: 06/25/2020, 4:17 PM  Clinical Narrative:     CSW confirmed with Damien Fusi that pt can return to [facility] as early as tomorrow. Covid vaccines to be faxed to [phone]  CSW [place] [facility] and confirmed pt had previous appointment for ACT arranged with [facility].   CSW called [facility] and scheduled ACT team assessment for Tuesday 11/16 at 10am. TOC to fax D/C summary to [facility] at [phone]. Pt will need [facility] to arrange transport to appointment. [facility] contact is [facility] and direct line is [phone].   Expected Discharge Plan: Assisted Living Barriers to Discharge: Continued Medical Work up  Expected Discharge Plan and Services Expected Discharge Plan: Assisted Living       Living arrangements for the past 2 months: [place] ([place])                                       Social Determinants of Health (SDOH) Interventions    Readmission Risk Interventions No flowsheet data found.

## 2020-06-26 ENCOUNTER — Telehealth (HOSPITAL_COMMUNITY): Payer: Self-pay | Admitting: *Deleted

## 2020-06-26 ENCOUNTER — Inpatient Hospital Stay (HOSPITAL_COMMUNITY): Payer: Medicare Other

## 2020-06-26 DIAGNOSIS — I824Z1 Acute embolism and thrombosis of unspecified deep veins of right distal lower extremity: Secondary | ICD-10-CM | POA: Diagnosis not present

## 2020-06-26 MED ORDER — RIVAROXABAN 15 MG PO TABS: 15.0000 mg | ORAL_TABLET | Freq: Two times a day (BID) | ORAL | Status: DC

## 2020-06-26 MED ORDER — RIVAROXABAN 20 MG PO TABS: 20.0000 mg | ORAL_TABLET | Freq: Every day | ORAL | Status: DC

## 2020-06-26 MED ORDER — HYDROXYZINE HCL 25 MG PO TABS: 25.0000 mg | ORAL_TABLET | Freq: Every evening | ORAL | 0 refills | Status: DC | PRN

## 2020-06-26 MED ORDER — RAMELTEON 8 MG PO TABS
8.0000 mg | ORAL_TABLET | Freq: Every day | ORAL | 0 refills | Status: DC
Start: 2020-06-26 — End: 2022-06-14

## 2020-06-26 MED ORDER — TRAZODONE HCL 50 MG PO TABS: 50.0000 mg | ORAL_TABLET | Freq: Every day | ORAL | Status: AC

## 2020-06-26 NOTE — Progress Notes (Addendum)
Family Medicine Teaching Service Daily Progress Note Intern Pager: [phone]  Patient name: [name] Medical record number: [id number] Date of birth: Sep 14, 1950 Age: 69 y.o. Gender: male  Primary Care Provider: Danna Hefty, DO Consultants:Neurology, psychiatry, vascular surgery Code Status:Full Pt Overview and Major Events to Date: Admitted 10/19  Assessment and Plan: Furious [name] a 69 y.o.malepresenting with RLE swelling and erythema + aphasia. PMH is significant for recent right foot crush injury s/p ORIF, primary schizophrenia, HFpEF,HTN, HLD, BPH, tobacco abuse, GERD, history of hep C s/p Harvoniandhistory of hep B cleared.Medically stable for discharge pending placement.  RLE DVT Denies any complaints and pain. Rt foot in Boot.PatientwithRLE swelling and erythema found to havechronic DVT involving the right peroneal veins without a cystic structure found in the popliteal fossa on DVT study 10/18.Treatment withDOAC for 3-6 months per vascular surgery. -rivaroxaban15mg  BID; transition to 20mg  qd on 06/28/20 - PT/OT recs, pending safe disposition to Assisted living.  Schizophrenia History of schizophrenia with echolalia and mumbling speech at baseline. Patient is resident of [facility] but has history of wandering off; last seen at facility on 10/15.Pt Schizophrenia is stable. No dangerous behaviors on the unit.  -Continue current psychiatric medications - s/p Prolixin (25 mg q14d) injection(11/5) , Next due 11/19 (Appointment made at [facility] on 11/19) - benztropine 1 mg BID -ramelteon 8 mg bedtime -hydroxyzine 25 mg prn bedtime for anxiety  Social needs Patientpreviouslyhomeless,recently staying at [place] this is likely affecting his medication compliance. Pt lacks capacity and surrogate decision makers have been difficult to contact. Current plans are for pt to return to [place] with CSW  yesterday- Pt will likely not go to SNF because of wandering issues but can go back to Assisted living. CSW made appointment with ACT team on 11/16. CSW will contact AL to arrange transportation for 11/16 Act appointment. CSW arranged transportation from [facility] to ACT appointment.  HTN Most recent , BP112/67 mmHG.Recent BP's have been stable.Home meds: Amlodipine 10mg  QD. -continue home Amlodipine - monitor BP  Right foot fracture H/o Lisfranc fracture s/p ORIF by [name] on 9/14. Saw [name] for follow up visit on 10/14 who recommended to continue with fracture boot with weight bearing as tolerated and follow up with repeat imaging in 4 weeks. Foot x-ray on admission notable for healing fractures of the second through fifth metatarsal. No interval change from the prior study. Repeat X ray shows Areas of postoperative fixation involving the first and second tarsal-metatarsal joints. -Continue fracture boot. -Will F/U with [name] for further recommendations.  Tobacco use Patient experiencing occasional cravings.  -Continuenicotine patch 14 mg  -continue toencourage smoking cessation  Insomnia.Nurse reported that Pt tried to wander last night but was easily redirectable. He slept well.His sleep had been improving after the medication changes. We will keep medications the same for now to evaluate if he has further issues with sleep, if this is the case can consider medication changes. - Continue Trazodone 50 mg QHS - Hydroxyzine 25 mg PRN QHS - Ramelteon 8 mg at bedtime  FEN/GI:regular diet  UGQ:[id number]  Disposition:Medically stable for discharge, awaiting placement.  Subjective: Pt is laying on his bed. Pt denies any complaints. Pt states his wife came recently. He slept good last night.  Objective: Temp:  [98.7 F (37.1 C)] 98.7 F (37.1 C) (11/10 0540) Pulse Rate:  [68] 68 (11/10 0540) Resp:  [18] 18 (11/10 0540) BP: (112)/(67) 112/67 (11/10  0540) SpO2:  [100 %]  100 % (11/10 0540) Physical Exam: General:He has mumbling difficult to understand speech.  He is in no acute distress.  Cardiovascular:RRR, no murmurs appreciated. Respiratory:Breath sounds clear, No wheezing, rales, rhonchi. Abdomen:No abdominal swelling, No abdominal pain. Extremities:No lower extremityedema or redness.[name] examine Rt foot because of boot. Laboratory: No results for input(s): WBC, HGB, HCT, PLT in the last 168 hours. No results for input(s): NA, K, CL, CO2, BUN, CREATININE, CALCIUM, PROT, BILITOT, ALKPHOS, ALT, AST, GLUCOSE in the last 168 hours.  Invalid input(s): LABALBU    Imaging/Diagnostic Tests:  Xray Foot Rt 11/10- Areas of postoperative fixation involving the first and second tarsal-metatarsal joints as well as screw fixation through a fracture of the proximal fifth metatarsal with alignment anatomic in these areas. Other fractures as noted in overall near anatomic alignment. No dislocations. No appreciable joint space narrowing or erosion.  Armando Reichert, MD 06/26/2020, 8:26 AM PGY-1, [place] Intern pager: [phone], text pages welcome

## 2020-06-26 NOTE — Telephone Encounter (Signed)
Call from [place], Education officer, museum at [employer]. He called Probation officer to inform me patient is being discharged today to [facility] and has an appt with [facility] on tues the 16th of Nov and will need his next shot on the 19th. Put him on the schedule and will coordinate with Donata Clay to bring him for his shot on fri the 19th.

## 2020-06-26 NOTE — NC FL2 (Signed)
[place] LEVEL OF CARE SCREENING TOOL     IDENTIFICATION  Patient Name: [name] Birthdate: 1951-06-12 Sex: male Admission Date (Current Location): 06/04/2020  [facility] and [place] Number:  Herbalist and Address:  [facility], [place] [address], [place], [place] [address]      Provider Number: [id number]  Attending Physician Name and Address:  Leeanne Rio, MD  Relative Name and Phone Number:       Current Level of Care: Hospital Recommended Level of Care: [place] Prior Approval Number:    Date Approved/Denied:   PASRR Number: pending  Discharge Plan: Other (Comment) (ALF)    Current Diagnoses: Patient Active Problem List   Diagnosis Date Noted  . Insomnia 06/21/2020  . Anxiety 06/18/2020  . Acute encephalopathy   . Cognitive impairment 06/12/2020  . AMS (altered mental status) 06/05/2020  . H/O noncompliance with medical treatment, presenting hazards to health   . DVT (deep venous thrombosis) ([place]) 06/04/2020  . Schizophrenia ([place])   . Homeless   . Dislocation of tarsometatarsal joint   . History of hepatitis B 04/26/2020  . Chronic hepatitis ([place])   . Dementia without behavioral disturbance ([place])   . Atrial fibrillation with RVR ([place]) 04/24/2020  . Malnutrition of moderate degree 04/24/2020  . History of drug abuse ([place]) 10/18/2017  . Tobacco abuse   . Diastolic heart failure ([place]) 02/05/2015  . Healthcare maintenance 05/28/2014  . Hyperlipidemia 03/10/2014  . [place] OCCLUSION 06/03/2010  . HYPERTENSION, BENIGN ESSENTIAL 01/17/2009  . BPH (benign prostatic hyperplasia) 01/17/2009  . History of hepatitis C 12/14/2008  . DEPRESSION 12/14/2008    Orientation RESPIRATION BLADDER Height & Weight     Self  Normal Continent Weight: 141 lb 1.5 oz (64 kg) Height:  5\' 9"  (175.3 cm)  BEHAVIORAL SYMPTOMS/MOOD NEUROLOGICAL BOWEL NUTRITION STATUS  [place]ande[place]   Continent Diet  (see d/c summary)  AMBULATORY STATUS COMMUNICATION OF NEEDS Skin   Supervision Verbally Surgical wounds                       Personal Care Assistance Level of Assistance  Bathing, Feeding, Dressing Bathing Assistance: Limited assistance Feeding assistance: Independent Dressing Assistance: Limited assistance     Functional Limitations Info  Sight, Hearing, Speech Sight Info: Impaired Hearing Info: Adequate Speech Info: Adequate    SPECIAL CARE FACTORS FREQUENCY  PT (By licensed PT), OT (By licensed OT)     PT Frequency: 5x/week OT Frequency: 5x/week            Contractures Contractures Info: Not present    Additional Factors Info  Code Status, Allergies Code Status Info: Full code Allergies Info: Penicillian, amoxicillian, ibuprophen, lisinopril           Discharge Medications:       Allergies as of 06/26/2020      Reactions   Lisinopril Other (See Comments)   Cough   Penicillins Hives   Amoxicillin Rash   Ibuprofen Rash               Medication List    TAKE these medications       acetaminophen 325 MG tablet Commonly known as: TYLENOL Take 2 tablets (650 mg total) by mouth every 6 (six) hours as needed for mild pain, moderate pain or headache.   amLODipine 10 MG tablet Commonly known as: NORVASC Take 10 mg by mouth daily.   benztropine 1  MG tablet Commonly known as: COGENTIN Take 1 tablet (1 mg total) by mouth 2 (two) times daily.   cetirizine 10 MG tablet Commonly known as: ZyrTEC Allergy Take 1 tablet (10 mg total) by mouth daily.   famotidine 20 MG tablet Commonly known as: PEPCID Take 20 mg by mouth 2 (two) times daily.   fluPHENAZine decanoate 25 MG/ML injection Commonly known as: PROLIXIN Inject 1 mL (25 mg total) into the muscle every 14 (fourteen) days. Last dose 09/07/12 What changed: additional instructions   fluticasone 50 MCG/ACT nasal spray Commonly known as: FLONASE Place 2 sprays into both nostrils  daily.   folic acid 1 MG tablet Commonly known as: FOLVITE Take 1 tablet (1 mg total) by mouth daily.   hydrOXYzine 25 MG tablet Commonly known as: ATARAX/VISTARIL Take 1 tablet (25 mg total) by mouth at bedtime as needed for anxiety (Anxiety, Insomnia).   multivitamin with minerals Tabs tablet Take 1 tablet by mouth daily.   polyethylene glycol 17 g packet Commonly known as: MIRALAX / GLYCOLAX Take 17 g by mouth 2 (two) times daily.   ramelteon 8 MG tablet Commonly known as: ROZEREM Take 1 tablet (8 mg total) by mouth at bedtime.   rivaroxaban 20 MG Tabs tablet Commonly known as: XARELTO Take 1 tablet (20 mg total) by mouth daily with supper.   tamsulosin 0.4 MG Caps capsule Commonly known as: FLOMAX TAKE 2 CAPSULE BY MOUTH DAILY What changed: See the new instructions.   thiamine 100 MG tablet Take 1 tablet (100 mg total) by mouth daily.   traZODone 50 MG tablet Commonly known as: DESYREL Take 1 tablet (50 mg total) by mouth at bedtime.   vitamin C 500 MG tablet Commonly known as: ASCORBIC ACID Take 500 mg by mouth daily.              Relevant Imaging Results:  Relevant Lab Results:   Additional Information SSN: [id number]  Bethann Berkshire, LCSW

## 2020-06-26 NOTE — Progress Notes (Signed)
Patient discharged to ALF via [facility]. Discharge instructions given to [facility]

## 2020-06-26 NOTE — TOC Transition Note (Signed)
Transition of Care [facility]) - CM/SW Discharge Note   Patient Details  Name: [name] MRN: [id number] Date of Birth: Feb 27, 1951  [facility] of [facility]) CM/SW Contact:  Bethann Berkshire, [place] Phone Number: 06/26/2020, 3:50 PM   Clinical Narrative:       Final next level of care: Assisted Living Barriers to Discharge: No Barriers Identified   Patient Goals and CMS Choice     Patient will DC to: [facility] Anticipated DC date: 06/26/20 Family notified: Family unable to be notified due to no phone number and minimal involvement Transport by: Corey Harold   Per MD patient ready for DC to [facility]. RN, patient, and facility notified of DC. Discharge Summary and FL2 sent to facility. Report has been called to [phone]. DC packet on chart. Ambulance transport requested for patient.   Pt has an ACT assessment with [facility] on 07/02/20 and will continue getting his Prolexin shot through [facility] on 07/01/20.   CSW will sign off for now as social work intervention is no longer needed. Please consult [place] again if new needs arise.     Discharge Placement  [facility]              Patient to be transferred to facility by: [facility]   Patient and family notified of of transfer: 06/26/20  Discharge Plan and [facility] Referral for ACT assessment with [facility]                                 Social Determinants of Health (SDOH) Interventions     Readmission Risk Interventions No flowsheet data found.

## 2020-06-26 NOTE — TOC Progression Note (Signed)
Transition of Care [facility]) - Progression Note    Patient Details  Name: [name] MRN: [id number] Date of Birth: [birth year]  Transition of Care [facility]) CM/SW [place], [place] Phone Number: 06/26/2020, 2:01 PM  Clinical Narrative:     CSW confirmed with [name] that pt can return today. They stated they would set up transportation for pt's ACT appointment with [facility] using medicaid transportation.   CSW called [name] from [facility] who states she will schedule pt for Prolixin  Injection on 07/05/20   Expected Discharge Plan: Assisted Living Barriers to Discharge: Continued Medical Work up  Expected Discharge Plan and Services Expected Discharge Plan: Assisted Living       Living arrangements for the past 2 months: [place] ([place])                                       Social Determinants of Health (SDOH) Interventions    Readmission Risk Interventions No flowsheet data found.

## 2020-06-27 ENCOUNTER — Ambulatory Visit: Payer: Medicare Other | Admitting: Orthopedic Surgery

## 2020-06-29 ENCOUNTER — Emergency Department (HOSPITAL_COMMUNITY)
Admission: EM | Admit: 2020-06-29 | Discharge: 2020-06-30 | Disposition: A | Discharge status: Home / Self Care | Payer: Medicare Other | Attending: Emergency Medicine | Admitting: Emergency Medicine

## 2020-06-29 ENCOUNTER — Other Ambulatory Visit: Payer: Self-pay

## 2020-06-29 ENCOUNTER — Encounter (HOSPITAL_COMMUNITY): Payer: Self-pay | Admitting: Emergency Medicine

## 2020-06-29 DIAGNOSIS — F17210 Nicotine dependence, cigarettes, uncomplicated: Secondary | ICD-10-CM | POA: Diagnosis not present

## 2020-06-29 DIAGNOSIS — X31XXXA Exposure to excessive natural cold, initial encounter: Secondary | ICD-10-CM | POA: Insufficient documentation

## 2020-06-29 DIAGNOSIS — Z711 Person with feared health complaint in whom no diagnosis is made: Secondary | ICD-10-CM | POA: Insufficient documentation

## 2020-06-29 DIAGNOSIS — F0390 Unspecified dementia without behavioral disturbance: Secondary | ICD-10-CM | POA: Insufficient documentation

## 2020-06-29 DIAGNOSIS — Z79899 Other long term (current) drug therapy: Secondary | ICD-10-CM | POA: Diagnosis not present

## 2020-06-29 DIAGNOSIS — Z7901 Long term (current) use of anticoagulants: Secondary | ICD-10-CM | POA: Diagnosis not present

## 2020-06-29 DIAGNOSIS — R209 Unspecified disturbances of skin sensation: Secondary | ICD-10-CM | POA: Diagnosis not present

## 2020-06-29 DIAGNOSIS — I110 Hypertensive heart disease with heart failure: Secondary | ICD-10-CM | POA: Insufficient documentation

## 2020-06-29 DIAGNOSIS — I5030 Unspecified diastolic (congestive) heart failure: Secondary | ICD-10-CM | POA: Insufficient documentation

## 2020-06-29 DIAGNOSIS — Z5900 Homelessness unspecified: Secondary | ICD-10-CM | POA: Insufficient documentation

## 2020-06-29 DIAGNOSIS — T699XXA Effect of reduced temperature, unspecified, initial encounter: Secondary | ICD-10-CM

## 2020-06-29 NOTE — ED Triage Notes (Signed)
Patient arrived with EMS from [facility] reports feeling cold this evening , denies cough , respirations unlabored , no fever or chills .

## 2020-06-29 NOTE — ED Provider Notes (Addendum)
[facility] EMERGENCY DEPARTMENT Provider Note   CSN: [id number] Arrival date & time: 06/29/20  2143     History Chief Complaint  Patient presents with  . Feels Cold    [name] is a 69 y.o. male.  Patient presents to the ED with a chief complaint of being cold.  He states that h was just feeling cold tonight and wanted to be evaluated.  He arrived via EMS from [name].  He denies fevers, chills, or cough.  He denies any other associated symptoms tonight.  He asks for something to eat.  The history is provided by the patient. No language interpreter was used.       Past Medical History:  Diagnosis Date  . Acute encephalopathy 04/23/2020  . Acute metabolic encephalopathy 7/0/6237  . AKI (acute kidney injury) ([name]) 05/12/2020  . BPH (benign prostatic hyperplasia)   . Closed displaced fracture of fifth metatarsal bone of right foot   . Colon polyp 2010  . Crush injury   . Depression   . GERD (gastroesophageal reflux disease)   . Hepatitis C    "caught it when I had a blood transfusion"  . High cholesterol   . History of blood transfusion    "when I was young"  . Hypertension   . Paranoid schizophrenia ([place])   . PARANOID SCHIZOPHRENIA, CHRONIC 01/17/2009  . Prostate atrophy   . Retinal vein occlusion     Patient Active Problem List   Diagnosis Date Noted  . Insomnia 06/21/2020  . Anxiety 06/18/2020  . Acute encephalopathy   . Cognitive impairment 06/12/2020  . AMS (altered mental status) 06/05/2020  . H/O noncompliance with medical treatment, presenting hazards to health   . DVT (deep venous thrombosis) ([place]) 06/04/2020  . Schizophrenia ([name])   . Homeless   . Dislocation of tarsometatarsal joint   . History of hepatitis B 04/26/2020  . Chronic hepatitis ([place])   . Dementia without behavioral disturbance ([name])   . Atrial fibrillation with RVR ([name]) 04/24/2020  . Malnutrition of moderate degree 04/24/2020  . History of drug abuse ([place])  10/18/2017  . Tobacco abuse   . Diastolic heart failure ([name]) 02/05/2015  . Healthcare maintenance 05/28/2014  . Hyperlipidemia 03/10/2014  . [place] OCCLUSION 06/03/2010  . HYPERTENSION, BENIGN ESSENTIAL 01/17/2009  . BPH (benign prostatic hyperplasia) 01/17/2009  . History of hepatitis C 12/14/2008  . DEPRESSION 12/14/2008    Past Surgical History:  Procedure Laterality Date  . CIRCUMCISION  1959  . ORIF TOE FRACTURE Right 04/30/2020   Procedure: OPEN REDUCTION INTERNAL FIXATION (ORIF) RIGHT FOOT METATARSAL FRACTURES;  Surgeon: Newt Minion, MD;  Location: [name];  Service: Orthopedics;  Laterality: Right;  . TONSILLECTOMY         Family History  Problem Relation Age of Onset  . Hypertension Mother   . Diabetes Mother   . Stroke Mother     Social History   Tobacco Use  . Smoking status: Current Every Day Smoker    Packs/day: 0.50    Years: 48.00    Pack years: 24.00    Types: Cigarettes  . Smokeless tobacco: Never Used  Substance Use Topics  . Alcohol use: Yes  . Drug use: No    Home Medications Prior to Admission medications   Medication Sig Start Date End Date Taking? Authorizing Provider  acetaminophen (TYLENOL) 325 MG tablet Take 2 tablets (650 mg total) by mouth every 6 (six) hours as needed for  mild pain, moderate pain or headache. 05/09/20   Mullis, Kiersten P, DO  amLODipine (NORVASC) 10 MG tablet Take 10 mg by mouth daily. 05/10/20   [provider]  benztropine (COGENTIN) 1 MG tablet Take 1 tablet (1 mg total) by mouth 2 (two) times daily. 02/26/20   Salley Slaughter, NP  cetirizine (ZYRTEC ALLERGY) 10 MG tablet Take 1 tablet (10 mg total) by mouth daily. 02/01/20   [name], [name], PA-C  famotidine (PEPCID) 20 MG tablet Take 20 mg by mouth 2 (two) times daily. 05/10/20   [provider]  fluPHENAZine decanoate (PROLIXIN) 25 MG/ML injection Inject 1 mL (25 mg total) into the muscle every 14 (fourteen) days. Last dose  09/07/12 Patient taking differently: Inject 25 mg into the muscle every 14 (fourteen) days. Last dose 05/09/2020 02/26/20   Salley Slaughter, NP  fluticasone (FLONASE) 50 MCG/ACT nasal spray Place 2 sprays into both nostrils daily. 05/10/20   [provider]  folic acid (FOLVITE) 1 MG tablet Take 1 tablet (1 mg total) by mouth daily. 05/09/20   Mullis, Kiersten P, DO  hydrOXYzine (ATARAX/VISTARIL) 25 MG tablet Take 1 tablet (25 mg total) by mouth at bedtime as needed for anxiety (Anxiety, Insomnia). 06/26/20   Wilber Oliphant, MD  Multiple Vitamin (MULTIVITAMIN WITH MINERALS) TABS tablet Take 1 tablet by mouth daily. 06/28/14   Bacigalupo, Dionne Bucy, MD  polyethylene glycol (MIRALAX / GLYCOLAX) 17 g packet Take 17 g by mouth 2 (two) times daily.    [provider]  ramelteon (ROZEREM) 8 MG tablet Take 1 tablet (8 mg total) by mouth at bedtime. 06/26/20   Wilber Oliphant, MD  rivaroxaban (XARELTO) 20 MG TABS tablet Take 1 tablet (20 mg total) by mouth daily with supper. 06/26/20   Wilber Oliphant, MD  tamsulosin (FLOMAX) 0.4 MG CAPS capsule TAKE 2 CAPSULE BY MOUTH DAILY Patient taking differently: Take 0.8 mg by mouth daily.  02/23/20   Mullis, Kiersten P, DO  thiamine 100 MG tablet Take 1 tablet (100 mg total) by mouth daily. 05/09/20   Mullis, Kiersten P, DO  traZODone (DESYREL) 50 MG tablet Take 1 tablet (50 mg total) by mouth at bedtime. 06/26/20   Wilber Oliphant, MD  vitamin C (ASCORBIC ACID) 500 MG tablet Take 500 mg by mouth daily.     [provider]  loratadine (CLARITIN) 10 MG tablet Take 1 tablet (10 mg total) by mouth daily. 12/28/19 02/05/20  [name], DO    Allergies    Lisinopril, Penicillins, Amoxicillin, and Ibuprofen  Review of Systems   Review of Systems  All other systems reviewed and are negative.   Physical Exam Updated Vital Signs BP (!) 155/95   Pulse 76   Temp 98.6 F (37 C)   Resp 18   SpO2 100%   Physical Exam Vitals and nursing note  reviewed.  Constitutional:      Appearance: He is well-developed.  HENT:     Head: Normocephalic and atraumatic.  Eyes:     Conjunctiva/sclera: Conjunctivae normal.  Cardiovascular:     Rate and Rhythm: Normal rate and regular rhythm.     Heart sounds: No murmur heard.   Pulmonary:     Effort: Pulmonary effort is normal. No respiratory distress.     Breath sounds: Normal breath sounds.  Abdominal:     Palpations: Abdomen is soft.     Tenderness: There is no abdominal tenderness.  Musculoskeletal:  General: Normal range of motion.     Cervical back: Neck supple.  Skin:    General: Skin is warm and dry.  Neurological:     Mental Status: He is alert and oriented to person, place, and time.  Psychiatric:        Mood and Affect: Mood normal.        Behavior: Behavior normal.     ED Results / Procedures / Treatments   Labs (all labs ordered are listed, but only abnormal results are displayed) Labs Reviewed - No data to display  EKG None  Radiology No results found.  Procedures Procedures (including critical care time)  Medications Ordered in ED Medications - No data to display  ED Course  I have reviewed the triage vital signs and the nursing notes.  Pertinent labs & imaging results that were available during my care of the patient were reviewed by me and considered in my medical decision making (see chart for details).    MDM Rules/Calculators/A&P                          Patient here for cold exposure.  He is not hypothermic.  He has reassuring vitals.  He doesn't having any other complaints.  Recent admission reviewed.  I don't think he needs any further emergent workup tonight.  Discussed with Dr. Randal Buba, who agrees with plan.  Final Clinical Impression(s) / ED Diagnoses Final diagnoses:  Cold exposure, initial encounter    Rx / DC Orders ED Discharge Orders    None        [name], PA-C 06/29/20 2351    Palumbo, April,  MD 06/30/20 [phone]

## 2020-06-30 ENCOUNTER — Encounter (HOSPITAL_COMMUNITY): Payer: Self-pay | Admitting: Emergency Medicine

## 2020-06-30 ENCOUNTER — Emergency Department (HOSPITAL_COMMUNITY)
Admission: EM | Admit: 2020-06-30 | Discharge: 2020-06-30 | Disposition: A | Discharge status: Home / Self Care | Payer: Medicare Other | Source: Home / Self Care | Attending: Emergency Medicine | Admitting: Emergency Medicine

## 2020-06-30 ENCOUNTER — Other Ambulatory Visit: Payer: Self-pay

## 2020-06-30 DIAGNOSIS — I10 Essential (primary) hypertension: Secondary | ICD-10-CM | POA: Insufficient documentation

## 2020-06-30 DIAGNOSIS — Z7901 Long term (current) use of anticoagulants: Secondary | ICD-10-CM | POA: Insufficient documentation

## 2020-06-30 DIAGNOSIS — T699XXA Effect of reduced temperature, unspecified, initial encounter: Secondary | ICD-10-CM

## 2020-06-30 DIAGNOSIS — Z711 Person with feared health complaint in whom no diagnosis is made: Secondary | ICD-10-CM | POA: Insufficient documentation

## 2020-06-30 DIAGNOSIS — R209 Unspecified disturbances of skin sensation: Secondary | ICD-10-CM | POA: Diagnosis not present

## 2020-06-30 DIAGNOSIS — Z79899 Other long term (current) drug therapy: Secondary | ICD-10-CM | POA: Insufficient documentation

## 2020-06-30 DIAGNOSIS — I5030 Unspecified diastolic (congestive) heart failure: Secondary | ICD-10-CM | POA: Insufficient documentation

## 2020-06-30 DIAGNOSIS — F17210 Nicotine dependence, cigarettes, uncomplicated: Secondary | ICD-10-CM | POA: Insufficient documentation

## 2020-06-30 DIAGNOSIS — X31XXXA Exposure to excessive natural cold, initial encounter: Secondary | ICD-10-CM | POA: Insufficient documentation

## 2020-06-30 NOTE — ED Triage Notes (Addendum)
Patient states that he is cold. He was seen for same last night. Hx TBI. EMS reports temporal temp of 100.

## 2020-06-30 NOTE — ED Notes (Addendum)
Pt. Discharged but nurse notified that pt. is suppose to go via PTAR to [facility] so pt. still on site waiting for PTAR.

## 2020-06-30 NOTE — ED Provider Notes (Signed)
[facility] DEPT Provider Note   CSN: [id number] Arrival date & time: 06/30/20  2147     History Chief Complaint  Patient presents with  . Cold Exposure    [name] is a 69 y.o. male.  The history is provided by the patient and medical records.    69 y.o. M with hx of TBI , depression, GERD, Hep C, HTN, schizophrenia, presenting to the ED with reported cold exposure.  EMS picked him up from [employer] station.  Patient without current complaints other than wanting water.  Past Medical History:  Diagnosis Date  . Acute encephalopathy 04/23/2020  . Acute metabolic encephalopathy 4/0/9811  . AKI (acute kidney injury) ([place]) 05/12/2020  . BPH (benign prostatic hyperplasia)   . Closed displaced fracture of fifth metatarsal bone of right foot   . Colon polyp 2010  . Crush injury   . Depression   . GERD (gastroesophageal reflux disease)   . Hepatitis C    "caught it when I had a blood transfusion"  . High cholesterol   . History of blood transfusion    "when I was young"  . Hypertension   . Paranoid schizophrenia ([place])   . PARANOID SCHIZOPHRENIA, CHRONIC 01/17/2009  . Prostate atrophy   . Retinal vein occlusion     Patient Active Problem List   Diagnosis Date Noted  . Insomnia 06/21/2020  . Anxiety 06/18/2020  . Acute encephalopathy   . Cognitive impairment 06/12/2020  . AMS (altered mental status) 06/05/2020  . H/O noncompliance with medical treatment, presenting hazards to health   . DVT (deep venous thrombosis) ([place]) 06/04/2020  . Schizophrenia ([place])   . Homeless   . Dislocation of tarsometatarsal joint   . History of hepatitis B 04/26/2020  . Chronic hepatitis ([place])   . Dementia without behavioral disturbance ([place])   . Atrial fibrillation with RVR ([place]) 04/24/2020  . Malnutrition of moderate degree 04/24/2020  . History of drug abuse ([place]) 10/18/2017  . Tobacco abuse   . Diastolic heart failure ([place]) 02/05/2015  .  Healthcare maintenance 05/28/2014  . Hyperlipidemia 03/10/2014  . [place] OCCLUSION 06/03/2010  . HYPERTENSION, BENIGN ESSENTIAL 01/17/2009  . BPH (benign prostatic hyperplasia) 01/17/2009  . History of hepatitis C 12/14/2008  . DEPRESSION 12/14/2008    Past Surgical History:  Procedure Laterality Date  . CIRCUMCISION  1959  . ORIF TOE FRACTURE Right 04/30/2020   Procedure: OPEN REDUCTION INTERNAL FIXATION (ORIF) RIGHT FOOT METATARSAL FRACTURES;  Surgeon: Newt Minion, MD;  Location: [facility];  Service: Orthopedics;  Laterality: Right;  . TONSILLECTOMY         Family History  Problem Relation Age of Onset  . Hypertension Mother   . Diabetes Mother   . Stroke Mother     Social History   Tobacco Use  . Smoking status: Current Every Day Smoker    Packs/day: 0.50    Years: 48.00    Pack years: 24.00    Types: Cigarettes  . Smokeless tobacco: Never Used  Substance Use Topics  . Alcohol use: Yes  . Drug use: No    Home Medications Prior to Admission medications   Medication Sig Start Date End Date Taking? Authorizing Provider  acetaminophen (TYLENOL) 325 MG tablet Take 2 tablets (650 mg total) by mouth every 6 (six) hours as needed for mild pain, moderate pain or headache. 05/09/20   Mullis, Kiersten P, DO  amLODipine (NORVASC) 10 MG tablet Take 10  mg by mouth daily. 05/10/20   [provider]  benztropine (COGENTIN) 1 MG tablet Take 1 tablet (1 mg total) by mouth 2 (two) times daily. 02/26/20   Salley Slaughter, NP  cetirizine (ZYRTEC ALLERGY) 10 MG tablet Take 1 tablet (10 mg total) by mouth daily. 02/01/20   [name], [name], PA-C  famotidine (PEPCID) 20 MG tablet Take 20 mg by mouth 2 (two) times daily. 05/10/20   [provider]  fluPHENAZine decanoate (PROLIXIN) 25 MG/ML injection Inject 1 mL (25 mg total) into the muscle every 14 (fourteen) days. Last dose 09/07/12 Patient taking differently: Inject 25 mg into the muscle every 14 (fourteen)  days. Last dose 05/09/2020 02/26/20   Salley Slaughter, NP  fluticasone (FLONASE) 50 MCG/ACT nasal spray Place 2 sprays into both nostrils daily. 05/10/20   [provider]  folic acid (FOLVITE) 1 MG tablet Take 1 tablet (1 mg total) by mouth daily. 05/09/20   Mullis, Kiersten P, DO  hydrOXYzine (ATARAX/VISTARIL) 25 MG tablet Take 1 tablet (25 mg total) by mouth at bedtime as needed for anxiety (Anxiety, Insomnia). 06/26/20   Wilber Oliphant, MD  Multiple Vitamin (MULTIVITAMIN WITH MINERALS) TABS tablet Take 1 tablet by mouth daily. 06/28/14   Bacigalupo, Dionne Bucy, MD  polyethylene glycol (MIRALAX / GLYCOLAX) 17 g packet Take 17 g by mouth 2 (two) times daily.    [provider]  ramelteon (ROZEREM) 8 MG tablet Take 1 tablet (8 mg total) by mouth at bedtime. 06/26/20   Wilber Oliphant, MD  rivaroxaban (XARELTO) 20 MG TABS tablet Take 1 tablet (20 mg total) by mouth daily with supper. 06/26/20   Wilber Oliphant, MD  tamsulosin (FLOMAX) 0.4 MG CAPS capsule TAKE 2 CAPSULE BY MOUTH DAILY Patient taking differently: Take 0.8 mg by mouth daily.  02/23/20   Mullis, Kiersten P, DO  thiamine 100 MG tablet Take 1 tablet (100 mg total) by mouth daily. 05/09/20   Mullis, Kiersten P, DO  traZODone (DESYREL) 50 MG tablet Take 1 tablet (50 mg total) by mouth at bedtime. 06/26/20   Wilber Oliphant, MD  vitamin C (ASCORBIC ACID) 500 MG tablet Take 500 mg by mouth daily.     [provider]  loratadine (CLARITIN) 10 MG tablet Take 1 tablet (10 mg total) by mouth daily. 12/28/19 02/05/20  [name], DO    Allergies    Lisinopril, Penicillins, Amoxicillin, and Ibuprofen  Review of Systems   Review of Systems  Constitutional:       Cold exposure  All other systems reviewed and are negative.   Physical Exam Updated Vital Signs BP (!) 128/95 (BP Location: Left Arm)   Pulse 82   Temp 99.7 F (37.6 C) (Oral)   Resp 18   SpO2 96%   Physical Exam Vitals and nursing note reviewed.    Constitutional:      Appearance: He is well-developed.  HENT:     Head: Normocephalic and atraumatic.  Eyes:     Conjunctiva/sclera: Conjunctivae normal.     Pupils: Pupils are equal, round, and reactive to light.  Cardiovascular:     Rate and Rhythm: Normal rate and regular rhythm.     Heart sounds: Normal heart sounds.  Pulmonary:     Effort: Pulmonary effort is normal.     Breath sounds: Normal breath sounds.  Abdominal:     General: Bowel sounds are normal.     Palpations: Abdomen is soft.  Musculoskeletal:  General: Normal range of motion.     Cervical back: Normal range of motion.     Comments: CAM walker right foot  Skin:    General: Skin is warm and dry.  Neurological:     Mental Status: He is alert and oriented to person, place, and time.     ED Results / Procedures / Treatments   Labs (all labs ordered are listed, but only abnormal results are displayed) Labs Reviewed - No data to display  EKG None  Radiology No results found.  Procedures Procedures (including critical care time)  Medications Ordered in ED Medications - No data to display  ED Course  I have reviewed the triage vital signs and the nursing notes.  Pertinent labs & imaging results that were available during my care of the patient were reviewed by me and considered in my medical decision making (see chart for details).    MDM Rules/Calculators/A&P  69 y.o. M here with reported cold exposure.  EMS picked him up from [employer] station.  Seen for same last evening.  Temp on arrival here is 99.59F.  On my exam he has no complaints other than wanting water.  As he is not hypothermic, I do not feel he needs further emergent work-up.  Discharged home in stable condition with close PCP follow-up.  Return here for any new/acute changes.  Final Clinical Impression(s) / ED Diagnoses Final diagnoses:  Cold exposure, initial encounter    Rx / DC Orders ED Discharge Orders    None        [name], PA-C 07/01/20 0001    Molpus, Jenny Reichmann, MD 07/01/20 [phone]

## 2020-06-30 NOTE — ED Notes (Signed)
Report given to [place], [employer]. Nurse asked to speak with a nurse and Monique responded "we don't have one at the moment so we the med techs take report".

## 2020-07-01 ENCOUNTER — Ambulatory Visit (HOSPITAL_COMMUNITY): Payer: Self-pay

## 2020-07-03 ENCOUNTER — Other Ambulatory Visit: Payer: Self-pay

## 2020-07-03 ENCOUNTER — Ambulatory Visit (HOSPITAL_COMMUNITY): Payer: Medicare Other | Admitting: *Deleted

## 2020-07-03 ENCOUNTER — Encounter (HOSPITAL_COMMUNITY): Payer: Self-pay

## 2020-07-03 VITALS — BP 149/89 | HR 90

## 2020-07-03 DIAGNOSIS — F200 Paranoid schizophrenia: Secondary | ICD-10-CM

## 2020-07-03 NOTE — Progress Notes (Signed)
Showed up here today stating he is here for his shot. He was here Monday and was told by writer to be here for his shot on Thurs and [employer] was arranging his transportation. He showed up today and walked here. He saw his wife earlier today. He has new clothes and a new jacket on today. He is wearing his boot on his R leg and says his foot is feeling really good, denied pain in it. He states he is going back to [employer] to eat and sleep but doesn't have a way back. Gave him one bus ticket and told him it was only to be used for going back to [employer] and he promised he would do so. He denies any issues. [address] [place] and spoke with Santiago Glad to inform them I gave him his shot today and next appt is 12/1. He wasn't at the home when the ACT team came by to meet him. He has an appt tomorrow with Dr Sharol Given and they are aware of it, but he continues to leave the building ad lib and not always around for staff to get him where he is to be.

## 2020-07-04 ENCOUNTER — Ambulatory Visit: Payer: Medicare Other | Admitting: Orthopedic Surgery

## 2020-07-04 ENCOUNTER — Ambulatory Visit (HOSPITAL_COMMUNITY): Payer: Medicare Other

## 2020-07-04 ENCOUNTER — Encounter (HOSPITAL_COMMUNITY): Payer: Self-pay

## 2020-07-04 ENCOUNTER — Emergency Department (HOSPITAL_COMMUNITY)
Admission: EM | Admit: 2020-07-04 | Discharge: 2020-07-04 | Disposition: A | Discharge status: Home / Self Care | Payer: Medicare Other | Attending: Emergency Medicine | Admitting: Emergency Medicine

## 2020-07-04 DIAGNOSIS — F17210 Nicotine dependence, cigarettes, uncomplicated: Secondary | ICD-10-CM | POA: Insufficient documentation

## 2020-07-04 DIAGNOSIS — I10 Essential (primary) hypertension: Secondary | ICD-10-CM | POA: Diagnosis not present

## 2020-07-04 DIAGNOSIS — Z79899 Other long term (current) drug therapy: Secondary | ICD-10-CM | POA: Insufficient documentation

## 2020-07-04 DIAGNOSIS — R5381 Other malaise: Secondary | ICD-10-CM | POA: Diagnosis present

## 2020-07-04 LAB — BASIC METABOLIC PANEL
Anion gap: 11 (ref 5–15)
BUN: 19 mg/dL (ref 8–23)
CO2: 23 mmol/L (ref 22–32)
Calcium: 8.8 mg/dL — ABNORMAL LOW (ref 8.9–10.3)
Chloride: 107 mmol/L (ref 98–111)
Creatinine, Ser: 1.57 mg/dL — ABNORMAL HIGH (ref 0.61–1.24)
GFR, Estimated: 48 mL/min — ABNORMAL LOW (ref >60)
Glucose, Bld: 146 mg/dL — ABNORMAL HIGH (ref 70–99)
Potassium: 3.8 mmol/L (ref 3.5–5.1)
Sodium: 141 mmol/L (ref 135–145)

## 2020-07-04 LAB — CBC
HCT: 34.1 % — ABNORMAL LOW (ref 39.0–52.0)
Hemoglobin: 10.8 g/dL — ABNORMAL LOW (ref 13.0–17.0)
MCH: 29.5 pg (ref 26.0–34.0)
MCHC: 31.7 g/dL (ref 30.0–36.0)
MCV: 93.2 fL (ref 80.0–100.0)
Platelets: 140 10*3/uL — ABNORMAL LOW (ref 150–400)
RBC: 3.66 MIL/uL — ABNORMAL LOW (ref 4.22–5.81)
RDW: 14.4 % (ref 11.5–15.5)
WBC: 5 10*3/uL (ref 4.0–10.5)
nRBC: 0 % (ref 0.0–0.2)

## 2020-07-04 NOTE — ED Notes (Signed)
Taxi called for pt.

## 2020-07-04 NOTE — Discharge Instructions (Signed)
Please follow-up with your primary care doctor regarding your symptoms from today.  Return to ER if you develop chest pain, difficulty breathing or other new concerning symptom.

## 2020-07-04 NOTE — ED Triage Notes (Addendum)
Pt arrives to ED w/ possible OD. Pt claiming that he took "too many" pills. When asked how many, pt states he took "2". Pt is here often and appears to be altered from his baseline and mildly lethargic. Difficult to determine any other information as pt is difficult to understand at baseline, and does not appear to be answering questions appropriately. Pt denies SI/HI and appears to be otherwise neuro intact. Pt is following commands with resp e/u and WNL. Pupils do not appear to be pinpoint, pt VSS.

## 2020-07-04 NOTE — ED Provider Notes (Signed)
[facility] EMERGENCY DEPARTMENT Provider Note   CSN: [id number] Arrival date & time: 07/04/20  1655     History Chief Complaint  Patient presents with  . Altered Mental Status    [name] is a 69 y.o. male. TBI , depression, GERD, Hep C, HTN, schizophrenia presents to emergency room for checkup.  He initially had reported that he took too many pills and then reported that he took 2 pills, when I questioned patient he denied taking any extra pills but states that he just wanted to be checked out today.  He denies any thoughts of hurting himself or hurting others.  Denies any acute medical complaints.  HPI     Past Medical History:  Diagnosis Date  . Acute encephalopathy 04/23/2020  . Acute metabolic encephalopathy 4/0/9811  . AKI (acute kidney injury) ([place]) 05/12/2020  . BPH (benign prostatic hyperplasia)   . Closed displaced fracture of fifth metatarsal bone of right foot   . Colon polyp 2010  . Crush injury   . Depression   . GERD (gastroesophageal reflux disease)   . Hepatitis C    "caught it when I had a blood transfusion"  . High cholesterol   . History of blood transfusion    "when I was young"  . Hypertension   . Paranoid schizophrenia ([place])   . PARANOID SCHIZOPHRENIA, CHRONIC 01/17/2009  . Prostate atrophy   . Retinal vein occlusion     Patient Active Problem List   Diagnosis Date Noted  . Insomnia 06/21/2020  . Anxiety 06/18/2020  . Acute encephalopathy   . Cognitive impairment 06/12/2020  . AMS (altered mental status) 06/05/2020  . H/O noncompliance with medical treatment, presenting hazards to health   . DVT (deep venous thrombosis) ([place]) 06/04/2020  . Schizophrenia ([place])   . Homeless   . Dislocation of tarsometatarsal joint   . History of hepatitis B 04/26/2020  . Chronic hepatitis ([place])   . Dementia without behavioral disturbance ([place])   . Atrial fibrillation with RVR ([place]) 04/24/2020  . Malnutrition of moderate degree  04/24/2020  . History of drug abuse ([place]) 10/18/2017  . Tobacco abuse   . Diastolic heart failure ([place]) 02/05/2015  . Healthcare maintenance 05/28/2014  . Hyperlipidemia 03/10/2014  . [place] OCCLUSION 06/03/2010  . HYPERTENSION, BENIGN ESSENTIAL 01/17/2009  . BPH (benign prostatic hyperplasia) 01/17/2009  . History of hepatitis C 12/14/2008  . DEPRESSION 12/14/2008    Past Surgical History:  Procedure Laterality Date  . CIRCUMCISION  1959  . ORIF TOE FRACTURE Right 04/30/2020   Procedure: OPEN REDUCTION INTERNAL FIXATION (ORIF) RIGHT FOOT METATARSAL FRACTURES;  Surgeon: Newt Minion, MD;  Location: [facility];  Service: Orthopedics;  Laterality: Right;  . TONSILLECTOMY         Family History  Problem Relation Age of Onset  . Hypertension Mother   . Diabetes Mother   . Stroke Mother     Social History   Tobacco Use  . Smoking status: Current Every Day Smoker    Packs/day: 0.50    Years: 48.00    Pack years: 24.00    Types: Cigarettes  . Smokeless tobacco: Never Used  Substance Use Topics  . Alcohol use: Yes  . Drug use: No    Home Medications Prior to Admission medications   Medication Sig Start Date End Date Taking? Authorizing Provider  acetaminophen (TYLENOL) 325 MG tablet Take 2 tablets (650 mg total) by mouth every 6 (six) hours as  needed for mild pain, moderate pain or headache. 05/09/20   Mullis, Kiersten P, DO  amLODipine (NORVASC) 10 MG tablet Take 10 mg by mouth daily. 05/10/20   [provider]  benztropine (COGENTIN) 1 MG tablet Take 1 tablet (1 mg total) by mouth 2 (two) times daily. 02/26/20   Salley Slaughter, NP  cetirizine (ZYRTEC ALLERGY) 10 MG tablet Take 1 tablet (10 mg total) by mouth daily. 02/01/20   [name], [name], PA-C  famotidine (PEPCID) 20 MG tablet Take 20 mg by mouth 2 (two) times daily. 05/10/20   [provider]  fluPHENAZine decanoate (PROLIXIN) 25 MG/ML injection Inject 1 mL (25 mg total) into the muscle  every 14 (fourteen) days. Last dose 09/07/12 Patient taking differently: Inject 25 mg into the muscle every 14 (fourteen) days. Last dose 05/09/2020 02/26/20   Salley Slaughter, NP  fluticasone (FLONASE) 50 MCG/ACT nasal spray Place 2 sprays into both nostrils daily. 05/10/20   [provider]  folic acid (FOLVITE) 1 MG tablet Take 1 tablet (1 mg total) by mouth daily. 05/09/20   Mullis, Kiersten P, DO  hydrOXYzine (ATARAX/VISTARIL) 25 MG tablet Take 1 tablet (25 mg total) by mouth at bedtime as needed for anxiety (Anxiety, Insomnia). 06/26/20   Wilber Oliphant, MD  Multiple Vitamin (MULTIVITAMIN WITH MINERALS) TABS tablet Take 1 tablet by mouth daily. 06/28/14   Bacigalupo, Dionne Bucy, MD  polyethylene glycol (MIRALAX / GLYCOLAX) 17 g packet Take 17 g by mouth 2 (two) times daily.    [provider]  ramelteon (ROZEREM) 8 MG tablet Take 1 tablet (8 mg total) by mouth at bedtime. 06/26/20   Wilber Oliphant, MD  rivaroxaban (XARELTO) 20 MG TABS tablet Take 1 tablet (20 mg total) by mouth daily with supper. 06/26/20   Wilber Oliphant, MD  tamsulosin (FLOMAX) 0.4 MG CAPS capsule TAKE 2 CAPSULE BY MOUTH DAILY Patient taking differently: Take 0.8 mg by mouth daily.  02/23/20   Mullis, Kiersten P, DO  thiamine 100 MG tablet Take 1 tablet (100 mg total) by mouth daily. 05/09/20   Mullis, Kiersten P, DO  traZODone (DESYREL) 50 MG tablet Take 1 tablet (50 mg total) by mouth at bedtime. 06/26/20   Wilber Oliphant, MD  vitamin C (ASCORBIC ACID) 500 MG tablet Take 500 mg by mouth daily.     [provider]  loratadine (CLARITIN) 10 MG tablet Take 1 tablet (10 mg total) by mouth daily. 12/28/19 02/05/20  [name], DO    Allergies    Lisinopril, Penicillins, Amoxicillin, and Ibuprofen  Review of Systems   Review of Systems  Constitutional: Negative for chills and fever.  HENT: Negative for ear pain and sore throat.   Eyes: Negative for pain and visual disturbance.  Respiratory: Negative for  cough and shortness of breath.   Cardiovascular: Negative for chest pain and palpitations.  Gastrointestinal: Negative for abdominal pain and vomiting.  Genitourinary: Negative for dysuria and hematuria.  Musculoskeletal: Negative for arthralgias and back pain.  Skin: Negative for color change and rash.  Neurological: Negative for seizures and syncope.  All other systems reviewed and are negative.   Physical Exam Updated Vital Signs BP (!) 161/85 (BP Location: Right Arm)   Pulse 63   Temp 99.6 F (37.6 C) (Oral)   Resp 17   SpO2 100%   Physical Exam Vitals and nursing note reviewed.  Constitutional:      Appearance: He is well-developed.  HENT:  Head: Normocephalic and atraumatic.  Eyes:     Conjunctiva/sclera: Conjunctivae normal.  Cardiovascular:     Rate and Rhythm: Normal rate and regular rhythm.     Heart sounds: No murmur heard.   Pulmonary:     Effort: Pulmonary effort is normal. No respiratory distress.     Breath sounds: Normal breath sounds.  Abdominal:     Palpations: Abdomen is soft.     Tenderness: There is no abdominal tenderness.  Musculoskeletal:        General: No signs of injury.     Cervical back: Neck supple.  Skin:    General: Skin is warm and dry.  Neurological:     General: No focal deficit present.     Mental Status: He is alert and oriented to person, place, and time.  Psychiatric:        Mood and Affect: Mood normal.        Behavior: Behavior normal.     ED Results / Procedures / Treatments   Labs (all labs ordered are listed, but only abnormal results are displayed) Labs Reviewed  CBC - Abnormal; Notable for the following components:      Result Value   RBC 3.66 (*)    Hemoglobin 10.8 (*)    HCT 34.1 (*)    Platelets 140 (*)    All other components within normal limits  BASIC METABOLIC PANEL - Abnormal; Notable for the following components:   Glucose, Bld 146 (*)    Creatinine, Ser 1.57 (*)    Calcium 8.8 (*)    GFR,  Estimated 48 (*)    All other components within normal limits  URINALYSIS, ROUTINE W REFLEX MICROSCOPIC  RAPID URINE DRUG SCREEN, HOSP PERFORMED    EKG None  Radiology No results found.  Procedures Procedures (including critical care time)  Medications Ordered in ED Medications - No data to display  ED Course  I have reviewed the triage vital signs and the nursing notes.  Pertinent labs & imaging results that were available during my care of the patient were reviewed by me and considered in my medical decision making (see chart for details).    MDM Rules/Calculators/A&P                         69 year old male presents to ER for checkup. He had reported taking an extra pill to the triage nurse however when I questioned patient on this report, he denied taking any extra medicine today.  He denies any active medical complaints or any active psychiatric complaints.  He had reassuring vital signs, reassuring physical exam.  Consulted case management who came to bedside and help make arrangements to have patient go back to [facility] this evening for patient to sleep there.    After the discussed management above, the patient was determined to be safe for discharge.  The patient was in agreement with this plan and all questions regarding their care were answered.  ED return precautions were discussed and the patient will return to the ED with any significant worsening of condition.   Final Clinical Impression(s) / ED Diagnoses Final diagnoses:  Malaise    Rx / DC Orders ED Discharge Orders    None       Lucrezia Starch, MD 07/05/20 [phone]

## 2020-07-05 ENCOUNTER — Emergency Department (HOSPITAL_COMMUNITY)
Admission: EM | Admit: 2020-07-05 | Discharge: 2020-07-06 | Disposition: A | Discharge status: Home / Self Care | Payer: Medicare Other | Attending: Emergency Medicine | Admitting: Emergency Medicine

## 2020-07-05 ENCOUNTER — Encounter (HOSPITAL_COMMUNITY): Payer: Self-pay

## 2020-07-05 DIAGNOSIS — I4891 Unspecified atrial fibrillation: Secondary | ICD-10-CM | POA: Insufficient documentation

## 2020-07-05 DIAGNOSIS — Z79899 Other long term (current) drug therapy: Secondary | ICD-10-CM | POA: Insufficient documentation

## 2020-07-05 DIAGNOSIS — F17210 Nicotine dependence, cigarettes, uncomplicated: Secondary | ICD-10-CM | POA: Diagnosis not present

## 2020-07-05 DIAGNOSIS — I5030 Unspecified diastolic (congestive) heart failure: Secondary | ICD-10-CM | POA: Diagnosis not present

## 2020-07-05 DIAGNOSIS — I110 Hypertensive heart disease with heart failure: Secondary | ICD-10-CM | POA: Insufficient documentation

## 2020-07-05 DIAGNOSIS — Z7901 Long term (current) use of anticoagulants: Secondary | ICD-10-CM | POA: Insufficient documentation

## 2020-07-05 DIAGNOSIS — F0390 Unspecified dementia without behavioral disturbance: Secondary | ICD-10-CM | POA: Insufficient documentation

## 2020-07-05 DIAGNOSIS — K219 Gastro-esophageal reflux disease without esophagitis: Secondary | ICD-10-CM | POA: Diagnosis not present

## 2020-07-05 DIAGNOSIS — R109 Unspecified abdominal pain: Secondary | ICD-10-CM | POA: Insufficient documentation

## 2020-07-05 NOTE — ED Triage Notes (Signed)
Pt bib [facility] w/ c/o feeling "sick" and "cold". Pt c/o abdominal pain.

## 2020-07-06 ENCOUNTER — Encounter (HOSPITAL_COMMUNITY): Payer: Self-pay | Admitting: *Deleted

## 2020-07-06 ENCOUNTER — Inpatient Hospital Stay (HOSPITAL_COMMUNITY)
Admission: EM | Admit: 2020-07-06 | Discharge: 2020-07-19 | DRG: 885 | Disposition: A | Discharge status: Skilled Nursing Facility | Payer: Medicare Other | Source: Skilled Nursing Facility | Attending: Family Medicine | Admitting: Family Medicine

## 2020-07-06 ENCOUNTER — Other Ambulatory Visit: Payer: Self-pay

## 2020-07-06 DIAGNOSIS — B1920 Unspecified viral hepatitis C without hepatic coma: Secondary | ICD-10-CM | POA: Diagnosis present

## 2020-07-06 DIAGNOSIS — R488 Other symbolic dysfunctions: Secondary | ICD-10-CM | POA: Diagnosis present

## 2020-07-06 DIAGNOSIS — S92351D Displaced fracture of fifth metatarsal bone, right foot, subsequent encounter for fracture with routine healing: Secondary | ICD-10-CM

## 2020-07-06 DIAGNOSIS — Z8249 Family history of ischemic heart disease and other diseases of the circulatory system: Secondary | ICD-10-CM

## 2020-07-06 DIAGNOSIS — Z880 Allergy status to penicillin: Secondary | ICD-10-CM

## 2020-07-06 DIAGNOSIS — D631 Anemia in chronic kidney disease: Secondary | ICD-10-CM | POA: Diagnosis present

## 2020-07-06 DIAGNOSIS — N1831 Chronic kidney disease, stage 3a: Secondary | ICD-10-CM | POA: Diagnosis present

## 2020-07-06 DIAGNOSIS — Z20822 Contact with and (suspected) exposure to covid-19: Secondary | ICD-10-CM | POA: Diagnosis present

## 2020-07-06 DIAGNOSIS — Z751 Person awaiting admission to adequate facility elsewhere: Secondary | ICD-10-CM

## 2020-07-06 DIAGNOSIS — I739 Peripheral vascular disease, unspecified: Secondary | ICD-10-CM | POA: Diagnosis present

## 2020-07-06 DIAGNOSIS — Z833 Family history of diabetes mellitus: Secondary | ICD-10-CM

## 2020-07-06 DIAGNOSIS — K219 Gastro-esophageal reflux disease without esophagitis: Secondary | ICD-10-CM | POA: Diagnosis present

## 2020-07-06 DIAGNOSIS — R109 Unspecified abdominal pain: Secondary | ICD-10-CM | POA: Diagnosis present

## 2020-07-06 DIAGNOSIS — Z5900 Homelessness unspecified: Secondary | ICD-10-CM

## 2020-07-06 DIAGNOSIS — F419 Anxiety disorder, unspecified: Secondary | ICD-10-CM | POA: Diagnosis present

## 2020-07-06 DIAGNOSIS — R251 Tremor, unspecified: Secondary | ICD-10-CM | POA: Diagnosis present

## 2020-07-06 DIAGNOSIS — R4182 Altered mental status, unspecified: Secondary | ICD-10-CM | POA: Diagnosis not present

## 2020-07-06 DIAGNOSIS — Z79899 Other long term (current) drug therapy: Secondary | ICD-10-CM

## 2020-07-06 DIAGNOSIS — E785 Hyperlipidemia, unspecified: Secondary | ICD-10-CM | POA: Diagnosis present

## 2020-07-06 DIAGNOSIS — Z886 Allergy status to analgesic agent status: Secondary | ICD-10-CM

## 2020-07-06 DIAGNOSIS — N400 Enlarged prostate without lower urinary tract symptoms: Secondary | ICD-10-CM | POA: Diagnosis present

## 2020-07-06 DIAGNOSIS — G4700 Insomnia, unspecified: Secondary | ICD-10-CM | POA: Diagnosis present

## 2020-07-06 DIAGNOSIS — F200 Paranoid schizophrenia: Secondary | ICD-10-CM | POA: Diagnosis not present

## 2020-07-06 DIAGNOSIS — R2681 Unsteadiness on feet: Secondary | ICD-10-CM | POA: Diagnosis present

## 2020-07-06 DIAGNOSIS — Z89512 Acquired absence of left leg below knee: Secondary | ICD-10-CM

## 2020-07-06 DIAGNOSIS — I130 Hypertensive heart and chronic kidney disease with heart failure and stage 1 through stage 4 chronic kidney disease, or unspecified chronic kidney disease: Secondary | ICD-10-CM | POA: Diagnosis present

## 2020-07-06 DIAGNOSIS — R001 Bradycardia, unspecified: Secondary | ICD-10-CM | POA: Diagnosis present

## 2020-07-06 DIAGNOSIS — I5032 Chronic diastolic (congestive) heart failure: Secondary | ICD-10-CM | POA: Diagnosis present

## 2020-07-06 DIAGNOSIS — Z86718 Personal history of other venous thrombosis and embolism: Secondary | ICD-10-CM

## 2020-07-06 DIAGNOSIS — E162 Hypoglycemia, unspecified: Secondary | ICD-10-CM | POA: Diagnosis present

## 2020-07-06 DIAGNOSIS — D696 Thrombocytopenia, unspecified: Secondary | ICD-10-CM | POA: Diagnosis present

## 2020-07-06 DIAGNOSIS — F17210 Nicotine dependence, cigarettes, uncomplicated: Secondary | ICD-10-CM | POA: Diagnosis present

## 2020-07-06 DIAGNOSIS — S92901A Unspecified fracture of right foot, initial encounter for closed fracture: Secondary | ICD-10-CM

## 2020-07-06 DIAGNOSIS — Z9114 Patient's other noncompliance with medication regimen: Secondary | ICD-10-CM

## 2020-07-06 DIAGNOSIS — F0390 Unspecified dementia without behavioral disturbance: Secondary | ICD-10-CM | POA: Diagnosis present

## 2020-07-06 DIAGNOSIS — Z888 Allergy status to other drugs, medicaments and biological substances status: Secondary | ICD-10-CM

## 2020-07-06 DIAGNOSIS — F32A Depression, unspecified: Secondary | ICD-10-CM | POA: Diagnosis present

## 2020-07-06 DIAGNOSIS — Z8719 Personal history of other diseases of the digestive system: Secondary | ICD-10-CM

## 2020-07-06 DIAGNOSIS — Z823 Family history of stroke: Secondary | ICD-10-CM

## 2020-07-06 DIAGNOSIS — I4891 Unspecified atrial fibrillation: Secondary | ICD-10-CM | POA: Diagnosis present

## 2020-07-06 DIAGNOSIS — Z7901 Long term (current) use of anticoagulants: Secondary | ICD-10-CM

## 2020-07-06 NOTE — ED Triage Notes (Signed)
The pt lives a a nursing home now and hes still here every day or night sometimes x 2 he was seen last pm for the same

## 2020-07-06 NOTE — ED Notes (Signed)
Ptar called 

## 2020-07-06 NOTE — ED Provider Notes (Signed)
[facility] EMERGENCY DEPARTMENT Provider Note   CSN: [id number] Arrival date & time: 07/05/20  1851     History Chief Complaint  Patient presents with  . Abdominal Pain    [name] is a 69 y.o. male.  69 year old male with medical problems well documented below who presents the emergency department today for his 114th visit in 6 months.  He has similar complaints to previous.  He feels some abdominal discomfort but he thinks is because he is hungry.  Nursing is getting him sandwich.  He has no other complaints besides being cold however it is cold outside and he is homeless. No fevers.    Abdominal Pain      Past Medical History:  Diagnosis Date  . Acute encephalopathy 04/23/2020  . Acute metabolic encephalopathy 12/24/7414  . AKI (acute kidney injury) ([place]) 05/12/2020  . BPH (benign prostatic hyperplasia)   . Closed displaced fracture of fifth metatarsal bone of right foot   . Colon polyp 2010  . Crush injury   . Depression   . GERD (gastroesophageal reflux disease)   . Hepatitis C    "caught it when I had a blood transfusion"  . High cholesterol   . History of blood transfusion    "when I was young"  . Hypertension   . Paranoid schizophrenia ([place])   . PARANOID SCHIZOPHRENIA, CHRONIC 01/17/2009  . Prostate atrophy   . Retinal vein occlusion     Patient Active Problem List   Diagnosis Date Noted  . Insomnia 06/21/2020  . Anxiety 06/18/2020  . Acute encephalopathy   . Cognitive impairment 06/12/2020  . AMS (altered mental status) 06/05/2020  . H/O noncompliance with medical treatment, presenting hazards to health   . DVT (deep venous thrombosis) ([place]) 06/04/2020  . Schizophrenia ([place])   . Homeless   . Dislocation of tarsometatarsal joint   . History of hepatitis B 04/26/2020  . Chronic hepatitis ([place])   . Dementia without behavioral disturbance ([place])   . Atrial fibrillation with RVR ([place]) 04/24/2020  . Malnutrition of moderate degree  04/24/2020  . History of drug abuse ([place]) 10/18/2017  . Tobacco abuse   . Diastolic heart failure ([place]) 02/05/2015  . Healthcare maintenance 05/28/2014  . Hyperlipidemia 03/10/2014  . [place] OCCLUSION 06/03/2010  . HYPERTENSION, BENIGN ESSENTIAL 01/17/2009  . BPH (benign prostatic hyperplasia) 01/17/2009  . History of hepatitis C 12/14/2008  . DEPRESSION 12/14/2008    Past Surgical History:  Procedure Laterality Date  . CIRCUMCISION  1959  . ORIF TOE FRACTURE Right 04/30/2020   Procedure: OPEN REDUCTION INTERNAL FIXATION (ORIF) RIGHT FOOT METATARSAL FRACTURES;  Surgeon: Newt Minion, MD;  Location: [facility];  Service: Orthopedics;  Laterality: Right;  . TONSILLECTOMY         Family History  Problem Relation Age of Onset  . Hypertension Mother   . Diabetes Mother   . Stroke Mother     Social History   Tobacco Use  . Smoking status: Current Every Day Smoker    Packs/day: 0.50    Years: 48.00    Pack years: 24.00    Types: Cigarettes  . Smokeless tobacco: Never Used  Substance Use Topics  . Alcohol use: Yes  . Drug use: No    Home Medications Prior to Admission medications   Medication Sig Start Date End Date Taking? Authorizing Provider  acetaminophen (TYLENOL) 325 MG tablet Take 2 tablets (650 mg total) by mouth every 6 (six) hours  as needed for mild pain, moderate pain or headache. 05/09/20   Mullis, Kiersten P, DO  amLODipine (NORVASC) 10 MG tablet Take 10 mg by mouth daily. 05/10/20   [provider]  benztropine (COGENTIN) 1 MG tablet Take 1 tablet (1 mg total) by mouth 2 (two) times daily. 02/26/20   Salley Slaughter, NP  cetirizine (ZYRTEC ALLERGY) 10 MG tablet Take 1 tablet (10 mg total) by mouth daily. 02/01/20   [name], [name], PA-C  famotidine (PEPCID) 20 MG tablet Take 20 mg by mouth 2 (two) times daily. 05/10/20   [provider]  fluPHENAZine decanoate (PROLIXIN) 25 MG/ML injection Inject 1 mL (25 mg total) into the muscle  every 14 (fourteen) days. Last dose 09/07/12 Patient taking differently: Inject 25 mg into the muscle every 14 (fourteen) days. Last dose 05/09/2020 02/26/20   Salley Slaughter, NP  fluticasone (FLONASE) 50 MCG/ACT nasal spray Place 2 sprays into both nostrils daily. 05/10/20   [provider]  folic acid (FOLVITE) 1 MG tablet Take 1 tablet (1 mg total) by mouth daily. 05/09/20   Mullis, Kiersten P, DO  hydrOXYzine (ATARAX/VISTARIL) 25 MG tablet Take 1 tablet (25 mg total) by mouth at bedtime as needed for anxiety (Anxiety, Insomnia). 06/26/20   Wilber Oliphant, MD  Multiple Vitamin (MULTIVITAMIN WITH MINERALS) TABS tablet Take 1 tablet by mouth daily. 06/28/14   Bacigalupo, Dionne Bucy, MD  polyethylene glycol (MIRALAX / GLYCOLAX) 17 g packet Take 17 g by mouth 2 (two) times daily.    [provider]  ramelteon (ROZEREM) 8 MG tablet Take 1 tablet (8 mg total) by mouth at bedtime. 06/26/20   Wilber Oliphant, MD  rivaroxaban (XARELTO) 20 MG TABS tablet Take 1 tablet (20 mg total) by mouth daily with supper. 06/26/20   Wilber Oliphant, MD  tamsulosin (FLOMAX) 0.4 MG CAPS capsule TAKE 2 CAPSULE BY MOUTH DAILY Patient taking differently: Take 0.8 mg by mouth daily.  02/23/20   Mullis, Kiersten P, DO  thiamine 100 MG tablet Take 1 tablet (100 mg total) by mouth daily. 05/09/20   Mullis, Kiersten P, DO  traZODone (DESYREL) 50 MG tablet Take 1 tablet (50 mg total) by mouth at bedtime. 06/26/20   Wilber Oliphant, MD  vitamin C (ASCORBIC ACID) 500 MG tablet Take 500 mg by mouth daily.     [provider]  loratadine (CLARITIN) 10 MG tablet Take 1 tablet (10 mg total) by mouth daily. 12/28/19 02/05/20  [name], DO    Allergies    Lisinopril, Penicillins, Amoxicillin, and Ibuprofen  Review of Systems   Review of Systems  Gastrointestinal: Positive for abdominal pain.  All other systems reviewed and are negative.   Physical Exam Updated Vital Signs BP (!) 149/88 (BP Location: Right Arm)    Pulse 71   Temp 98.3 F (36.8 C) (Oral)   Resp 16   SpO2 100%   Physical Exam Vitals and nursing note reviewed.  Constitutional:      Appearance: He is well-developed.  HENT:     Head: Normocephalic and atraumatic.     Mouth/Throat:     Mouth: Mucous membranes are moist.     Pharynx: Oropharynx is clear.  Eyes:     Pupils: Pupils are equal, round, and reactive to light.  Cardiovascular:     Rate and Rhythm: Normal rate.  Pulmonary:     Effort: Pulmonary effort is normal. No respiratory distress.  Abdominal:     General:  Abdomen is flat. There is no distension.  Musculoskeletal:        General: Normal range of motion.     Cervical back: Normal range of motion.  Skin:    General: Skin is warm and dry.  Neurological:     General: No focal deficit present.     Mental Status: He is alert.     ED Results / Procedures / Treatments   Labs (all labs ordered are listed, but only abnormal results are displayed) Labs Reviewed - No data to display  EKG None  Radiology No results found.  Procedures Procedures (including critical care time)  Medications Ordered in ED Medications - No data to display  ED Course  I have reviewed the triage vital signs and the nursing notes.  Pertinent labs & imaging results that were available during my care of the patient were reviewed by me and considered in my medical decision making (see chart for details).    MDM Rules/Calculators/A&P                          Patient appears to be near his baseline mental status and physical examination wise.  I will see any indication for work-up or hospitalization at this time.  Final Clinical Impression(s) / ED Diagnoses Final diagnoses:  Abdominal pain, unspecified abdominal location    Rx / DC Orders ED Discharge Orders    None       Dianey Suchy, Corene Cornea, MD 07/06/20 [phone]

## 2020-07-07 ENCOUNTER — Other Ambulatory Visit: Payer: Self-pay

## 2020-07-07 ENCOUNTER — Emergency Department (HOSPITAL_COMMUNITY): Payer: Medicare Other

## 2020-07-07 DIAGNOSIS — R4182 Altered mental status, unspecified: Secondary | ICD-10-CM

## 2020-07-07 LAB — COMPREHENSIVE METABOLIC PANEL
ALT: 11 U/L (ref 0–44)
ALT: 11 U/L (ref 0–44)
AST: 17 U/L (ref 15–41)
AST: 18 U/L (ref 15–41)
Albumin: 3.5 g/dL (ref 3.5–5.0)
Albumin: 3.6 g/dL (ref 3.5–5.0)
Alkaline Phosphatase: 38 U/L (ref 38–126)
Alkaline Phosphatase: 37 U/L — ABNORMAL LOW (ref 38–126)
Anion gap: 9 (ref 5–15)
Anion gap: 10 (ref 5–15)
BUN: 16 mg/dL (ref 8–23)
BUN: 16 mg/dL (ref 8–23)
CO2: 27 mmol/L (ref 22–32)
CO2: 26 mmol/L (ref 22–32)
Calcium: 8.5 mg/dL — ABNORMAL LOW (ref 8.9–10.3)
Calcium: 8.6 mg/dL — ABNORMAL LOW (ref 8.9–10.3)
Chloride: 104 mmol/L (ref 98–111)
Chloride: 104 mmol/L (ref 98–111)
Creatinine, Ser: 1.22 mg/dL (ref 0.61–1.24)
Creatinine, Ser: 1.22 mg/dL (ref 0.61–1.24)
GFR, Estimated: >60 mL/min (ref >60)
GFR, Estimated: >60 mL/min (ref >60)
Glucose, Bld: 65 mg/dL — ABNORMAL LOW (ref 70–99)
Glucose, Bld: 88 mg/dL (ref 70–99)
Potassium: 3.3 mmol/L — ABNORMAL LOW (ref 3.5–5.1)
Potassium: 3.7 mmol/L (ref 3.5–5.1)
Sodium: 140 mmol/L (ref 135–145)
Sodium: 140 mmol/L (ref 135–145)
Total Bilirubin: 0.9 mg/dL (ref 0.3–1.2)
Total Bilirubin: 1 mg/dL (ref 0.3–1.2)
Total Protein: 6 g/dL — ABNORMAL LOW (ref 6.5–8.1)
Total Protein: 6.1 g/dL — ABNORMAL LOW (ref 6.5–8.1)

## 2020-07-07 LAB — CBC WITH DIFFERENTIAL/PLATELET
Abs Immature Granulocytes: 0.01 10*3/uL (ref 0.00–0.07)
Basophils Absolute: 0 10*3/uL (ref 0.0–0.1)
Basophils Relative: 1 %
Eosinophils Absolute: 0.1 10*3/uL (ref 0.0–0.5)
Eosinophils Relative: 3 %
HCT: 31.7 % — ABNORMAL LOW (ref 39.0–52.0)
Hemoglobin: 10.4 g/dL — ABNORMAL LOW (ref 13.0–17.0)
Immature Granulocytes: 0 %
Lymphocytes Relative: 36 %
Lymphs Abs: 1.6 10*3/uL (ref 0.7–4.0)
MCH: 29.6 pg (ref 26.0–34.0)
MCHC: 32.8 g/dL (ref 30.0–36.0)
MCV: 90.3 fL (ref 80.0–100.0)
Monocytes Absolute: 0.5 10*3/uL (ref 0.1–1.0)
Monocytes Relative: 11 %
Neutro Abs: 2.2 10*3/uL (ref 1.7–7.7)
Neutrophils Relative %: 49 %
Platelets: 121 10*3/uL — ABNORMAL LOW (ref 150–400)
RBC: 3.51 MIL/uL — ABNORMAL LOW (ref 4.22–5.81)
RDW: 13.9 % (ref 11.5–15.5)
WBC: 4.6 10*3/uL (ref 4.0–10.5)
nRBC: 0 % (ref 0.0–0.2)

## 2020-07-07 LAB — RAPID URINE DRUG SCREEN, HOSP PERFORMED
Amphetamines: NOT DETECTED
Barbiturates: NOT DETECTED
Benzodiazepines: NOT DETECTED
Cocaine: NOT DETECTED
Opiates: NOT DETECTED
Tetrahydrocannabinol: NOT DETECTED

## 2020-07-07 LAB — URINALYSIS, ROUTINE W REFLEX MICROSCOPIC
Bilirubin Urine: NEGATIVE
Glucose, UA: NEGATIVE mg/dL
Hgb urine dipstick: NEGATIVE
Ketones, ur: NEGATIVE mg/dL
Leukocytes,Ua: NEGATIVE
Nitrite: NEGATIVE
Protein, ur: NEGATIVE mg/dL
Specific Gravity, Urine: 1.009 (ref 1.005–1.030)
pH: 6 (ref 5.0–8.0)

## 2020-07-07 LAB — RESPIRATORY PANEL BY RT PCR (FLU A&B, COVID)
Influenza A by PCR: NEGATIVE
Influenza B by PCR: NEGATIVE
SARS Coronavirus 2 by RT PCR: NEGATIVE

## 2020-07-07 LAB — CBG MONITORING, ED: Glucose-Capillary: 86 mg/dL (ref 70–99)

## 2020-07-07 LAB — ETHANOL: Alcohol, Ethyl (B): <10 mg/dL (ref <10)

## 2020-07-07 LAB — AMMONIA: Ammonia: 31 umol/L (ref 9–35)

## 2020-07-07 MED ORDER — FLUPHENAZINE DECANOATE 25 MG/ML IJ SOLN
25.0000 mg | Freq: Once | INTRAMUSCULAR | Status: DC
  Filled 2020-07-07: qty 1

## 2020-07-07 MED ORDER — FLUTICASONE PROPIONATE 50 MCG/ACT NA SUSP
2.0000 | Freq: Every day | NASAL | Status: DC
  Administered 2020-07-07 – 2020-07-18 (×11): 2 via NASAL
  Filled 2020-07-07: qty 16

## 2020-07-07 MED ORDER — TAMSULOSIN HCL 0.4 MG PO CAPS
0.8000 mg | ORAL_CAPSULE | Freq: Every day | ORAL | Status: DC
  Administered 2020-07-07 – 2020-07-19 (×13): 0.8 mg via ORAL
  Filled 2020-07-07 (×13): qty 2

## 2020-07-07 MED ORDER — BENZTROPINE MESYLATE 1 MG PO TABS
1.0000 mg | ORAL_TABLET | Freq: Two times a day (BID) | ORAL | Status: DC
  Administered 2020-07-07 – 2020-07-19 (×24): 1 mg via ORAL
  Filled 2020-07-07 (×26): qty 1

## 2020-07-07 MED ORDER — THIAMINE HCL 100 MG PO TABS
100.0000 mg | ORAL_TABLET | Freq: Every day | ORAL | Status: DC
  Administered 2020-07-07 – 2020-07-19 (×13): 100 mg via ORAL
  Filled 2020-07-07 (×12): qty 1

## 2020-07-07 MED ORDER — FAMOTIDINE 20 MG PO TABS
20.0000 mg | ORAL_TABLET | Freq: Two times a day (BID) | ORAL | Status: DC
  Administered 2020-07-07 – 2020-07-19 (×24): 20 mg via ORAL
  Filled 2020-07-07 (×26): qty 1

## 2020-07-07 MED ORDER — ACETAMINOPHEN 325 MG PO TABS: 650.0000 mg | ORAL_TABLET | Freq: Four times a day (QID) | ORAL | Status: DC | PRN

## 2020-07-07 MED ORDER — AMLODIPINE BESYLATE 10 MG PO TABS
10.0000 mg | ORAL_TABLET | Freq: Every day | ORAL | Status: DC
  Administered 2020-07-07 – 2020-07-19 (×13): 10 mg via ORAL
  Filled 2020-07-07 (×9): qty 1
  Filled 2020-07-07: qty 2
  Filled 2020-07-07 (×3): qty 1

## 2020-07-07 MED ORDER — NICOTINE 7 MG/24HR TD PT24
7.0000 mg | MEDICATED_PATCH | Freq: Every day | TRANSDERMAL | Status: DC
  Administered 2020-07-07 – 2020-07-19 (×13): 7 mg via TRANSDERMAL
  Filled 2020-07-07 (×14): qty 1

## 2020-07-07 MED ORDER — ACETAMINOPHEN 650 MG RE SUPP: 650.0000 mg | Freq: Four times a day (QID) | RECTAL | Status: DC | PRN

## 2020-07-07 MED ORDER — RIVAROXABAN 20 MG PO TABS
20.0000 mg | ORAL_TABLET | Freq: Every day | ORAL | Status: DC
  Administered 2020-07-07 – 2020-07-18 (×12): 20 mg via ORAL
  Filled 2020-07-07 (×12): qty 1

## 2020-07-07 NOTE — ED Notes (Signed)
SDU Breakfast Orders

## 2020-07-07 NOTE — ED Notes (Addendum)
Accidentally validate all vitals, disregard respirations of 0. These are incorrect.

## 2020-07-07 NOTE — ED Notes (Signed)
Dinner Tray Ordered 1650 -ms

## 2020-07-07 NOTE — ED Notes (Signed)
Attempted to call report x2

## 2020-07-07 NOTE — ED Notes (Signed)
Pt is constantly getting up and out of bed trying to put on his clothes. Pt has been redirected back to bed.

## 2020-07-07 NOTE — ED Notes (Signed)
Pt out of bed. Steady gate but disoriented x3, oriented to self. Pt urinated in trash can although told multiple times that urinal is right next to him. Pt reoriented and placed back in bed, resting comfortably. Order for safety sitter placed

## 2020-07-07 NOTE — ED Notes (Signed)
Pt up out of bed again. Pt directed back to bed, resting comfortably.

## 2020-07-07 NOTE — ED Provider Notes (Signed)
[facility] EMERGENCY DEPARTMENT Provider Note   CSN: [id number] Arrival date & time: 07/06/20  2140     History Chief Complaint  Patient presents with  . Abdominal Pain    [name] is a 69 y.o. male.  Patient to ED complaining of abdominal pain on arrival. On my assessment the patient is altered with incoherent speech. Patient is well know to this ED and this is known to be significantly abnormal for his baseline. No fever. He does not appear SOB. No vomiting or diarrhea observed. He is not coughing.   The history is provided by the patient. No language interpreter was used.  Abdominal Pain      Past Medical History:  Diagnosis Date  . Acute encephalopathy 04/23/2020  . Acute metabolic encephalopathy 08/24/5629  . AKI (acute kidney injury) ([place]) 05/12/2020  . BPH (benign prostatic hyperplasia)   . Closed displaced fracture of fifth metatarsal bone of right foot   . Colon polyp 2010  . Crush injury   . Depression   . GERD (gastroesophageal reflux disease)   . Hepatitis C    "caught it when I had a blood transfusion"  . High cholesterol   . History of blood transfusion    "when I was young"  . Hypertension   . Paranoid schizophrenia ([place])   . PARANOID SCHIZOPHRENIA, CHRONIC 01/17/2009  . Prostate atrophy   . Retinal vein occlusion     Patient Active Problem List   Diagnosis Date Noted  . Insomnia 06/21/2020  . Anxiety 06/18/2020  . Acute encephalopathy   . Cognitive impairment 06/12/2020  . AMS (altered mental status) 06/05/2020  . H/O noncompliance with medical treatment, presenting hazards to health   . DVT (deep venous thrombosis) ([place]) 06/04/2020  . Schizophrenia ([place])   . Homeless   . Dislocation of tarsometatarsal joint   . History of hepatitis B 04/26/2020  . Chronic hepatitis ([place])   . Dementia without behavioral disturbance ([place])   . Atrial fibrillation with RVR ([place]) 04/24/2020  . Malnutrition of moderate degree 04/24/2020  .  History of drug abuse ([place]) 10/18/2017  . Tobacco abuse   . Diastolic heart failure ([place]) 02/05/2015  . Healthcare maintenance 05/28/2014  . Hyperlipidemia 03/10/2014  . [place] OCCLUSION 06/03/2010  . HYPERTENSION, BENIGN ESSENTIAL 01/17/2009  . BPH (benign prostatic hyperplasia) 01/17/2009  . History of hepatitis C 12/14/2008  . DEPRESSION 12/14/2008    Past Surgical History:  Procedure Laterality Date  . CIRCUMCISION  1959  . ORIF TOE FRACTURE Right 04/30/2020   Procedure: OPEN REDUCTION INTERNAL FIXATION (ORIF) RIGHT FOOT METATARSAL FRACTURES;  Surgeon: Newt Minion, MD;  Location: [facility];  Service: Orthopedics;  Laterality: Right;  . TONSILLECTOMY         Family History  Problem Relation Age of Onset  . Hypertension Mother   . Diabetes Mother   . Stroke Mother     Social History   Tobacco Use  . Smoking status: Current Every Day Smoker    Packs/day: 0.50    Years: 48.00    Pack years: 24.00    Types: Cigarettes  . Smokeless tobacco: Never Used  Substance Use Topics  . Alcohol use: Yes  . Drug use: No    Home Medications Prior to Admission medications   Medication Sig Start Date End Date Taking? Authorizing Provider  acetaminophen (TYLENOL) 325 MG tablet Take 2 tablets (650 mg total) by mouth every 6 (six) hours as needed for  mild pain, moderate pain or headache. 05/09/20   Mullis, Kiersten P, DO  amLODipine (NORVASC) 10 MG tablet Take 10 mg by mouth daily. 05/10/20   [provider]  benztropine (COGENTIN) 1 MG tablet Take 1 tablet (1 mg total) by mouth 2 (two) times daily. 02/26/20   Salley Slaughter, NP  cetirizine (ZYRTEC ALLERGY) 10 MG tablet Take 1 tablet (10 mg total) by mouth daily. 02/01/20   [name], [name], PA-C  famotidine (PEPCID) 20 MG tablet Take 20 mg by mouth 2 (two) times daily. 05/10/20   [provider]  fluPHENAZine decanoate (PROLIXIN) 25 MG/ML injection Inject 1 mL (25 mg total) into the muscle every 14 (fourteen)  days. Last dose 09/07/12 Patient taking differently: Inject 25 mg into the muscle every 14 (fourteen) days. Last dose 05/09/2020 02/26/20   Salley Slaughter, NP  fluticasone (FLONASE) 50 MCG/ACT nasal spray Place 2 sprays into both nostrils daily. 05/10/20   [provider]  folic acid (FOLVITE) 1 MG tablet Take 1 tablet (1 mg total) by mouth daily. 05/09/20   Mullis, Kiersten P, DO  hydrOXYzine (ATARAX/VISTARIL) 25 MG tablet Take 1 tablet (25 mg total) by mouth at bedtime as needed for anxiety (Anxiety, Insomnia). 06/26/20   Wilber Oliphant, MD  Multiple Vitamin (MULTIVITAMIN WITH MINERALS) TABS tablet Take 1 tablet by mouth daily. 06/28/14   Bacigalupo, Dionne Bucy, MD  polyethylene glycol (MIRALAX / GLYCOLAX) 17 g packet Take 17 g by mouth 2 (two) times daily.    [provider]  ramelteon (ROZEREM) 8 MG tablet Take 1 tablet (8 mg total) by mouth at bedtime. 06/26/20   Wilber Oliphant, MD  rivaroxaban (XARELTO) 20 MG TABS tablet Take 1 tablet (20 mg total) by mouth daily with supper. 06/26/20   Wilber Oliphant, MD  tamsulosin (FLOMAX) 0.4 MG CAPS capsule TAKE 2 CAPSULE BY MOUTH DAILY Patient taking differently: Take 0.8 mg by mouth daily.  02/23/20   Mullis, Kiersten P, DO  thiamine 100 MG tablet Take 1 tablet (100 mg total) by mouth daily. 05/09/20   Mullis, Kiersten P, DO  traZODone (DESYREL) 50 MG tablet Take 1 tablet (50 mg total) by mouth at bedtime. 06/26/20   Wilber Oliphant, MD  vitamin C (ASCORBIC ACID) 500 MG tablet Take 500 mg by mouth daily.     [provider]  loratadine (CLARITIN) 10 MG tablet Take 1 tablet (10 mg total) by mouth daily. 12/28/19 02/05/20  [name], DO    Allergies    Lisinopril, Penicillins, Amoxicillin, and Ibuprofen  Review of Systems   Review of Systems  Unable to perform ROS: Mental status change  Gastrointestinal: Positive for abdominal pain.    Physical Exam Updated Vital Signs BP (!) 156/92   Pulse 98   Temp 98.6 F (37 C)   Resp 18    SpO2 98%   Physical Exam Vitals and nursing note reviewed.  Constitutional:      Appearance: He is well-developed.  HENT:     Head: Normocephalic and atraumatic.     Mouth/Throat:     Mouth: Mucous membranes are moist.  Eyes:     Conjunctiva/sclera: Conjunctivae normal.     Pupils: Pupils are equal, round, and reactive to light.  Cardiovascular:     Rate and Rhythm: Normal rate and regular rhythm.  Pulmonary:     Effort: Pulmonary effort is normal. No respiratory distress.     Breath sounds: Normal breath sounds. No wheezing, rhonchi  or rales.  Abdominal:     Palpations: Abdomen is soft.     Tenderness: There is no guarding or rebound.  Musculoskeletal:        General: Normal range of motion.     Cervical back: Normal range of motion and neck supple.  Skin:    General: Skin is warm and dry.     Findings: No rash.  Neurological:     Mental Status: He is lethargic.     GCS: GCS eye subscore is 3. GCS verbal subscore is 2. GCS motor subscore is 4.     Cranial Nerves: Dysarthria present.     Comments: Patient does not participate in exam     ED Results / Procedures / Treatments   Labs (all labs ordered are listed, but only abnormal results are displayed) Labs Reviewed  RESPIRATORY PANEL BY RT PCR (FLU A&B, COVID)  CBC WITH DIFFERENTIAL/PLATELET  COMPREHENSIVE METABOLIC PANEL  ETHANOL  AMMONIA  URINALYSIS, ROUTINE W REFLEX MICROSCOPIC  RAPID URINE DRUG SCREEN, HOSP PERFORMED    EKG None  Radiology No results found.  Procedures Procedures (including critical care time) CRITICAL CARE Performed by: Dewaine Oats   Total critical care time: 40 minutes  Critical care time was exclusive of separately billable procedures and treating other patients.  Critical care was necessary to treat or prevent imminent or life-threatening deterioration.  Critical care was time spent personally by me on the following activities: development of treatment plan with patient  and/or surrogate as well as nursing, discussions with consultants, evaluation of patient's response to treatment, examination of patient, obtaining history from patient or surrogate, ordering and performing treatments and interventions, ordering and review of laboratory studies, ordering and review of radiographic studies, pulse oximetry and re-evaluation of patient's condition.  Medications Ordered in ED Medications - No data to display  ED Course  I have reviewed the triage vital signs and the nursing notes.  Pertinent labs & imaging results that were available during my care of the patient were reviewed by me and considered in my medical decision making (see chart for details).    MDM Rules/Calculators/A&P                          Patient well known to the ED with frequent benign complaints. Tonight presents with abdominal pain at triage, significant altered when examined by me.   Wakes to voice, unintelligible speech, no pain complaints. He was witnessed to get up and urinate in the trash can, very atypical of known behavior. Chart reviewed. Admitted with encephalopathy one month ago. Now resides in a nursing home. Not known to be a substance abuser.   Labs essentially unremarkable. CT head shows no acute changes.   Discussed with [facility] admitting resident who will see and evaluate for admission.   Final Clinical Impression(s) / ED Diagnoses Final diagnoses:  None   1. Altered mental status  Rx / DC Orders ED Discharge Orders    None       Charlann Lange, PA-C 07/08/20 0636    Mesner, Corene Cornea, MD 07/08/20 [phone]

## 2020-07-07 NOTE — ED Notes (Signed)
Attempted to call report x 3. 

## 2020-07-07 NOTE — Evaluation (Signed)
Physical Therapy Evaluation Patient Details Name: [name] MRN: [id number] DOB: [birth year] Today's Date: 07/07/2020   History of Present Illness  Pt is a 69 y/o male presenting with AMS. Workup pending. PMH includes schizophrenia, R foot ORIF, tobacco use, HTN and hep C.   Clinical Impression  Pt admitted secondary to problem above with deficits below. Pt well know to this PT, and noted increased cognitive deficits this admission. Pt much more impulsive and restless this session. Required multimodal cues for sequencing. Requiring min to min guard A For mobility tasks. Pt from ALF, however, pt may require higher level of care at d/c. Will continue to follow acutely.     Follow Up Recommendations SNF;Supervision/Assistance - 24 hour    Equipment Recommendations  None recommended by PT    Recommendations for Other Services       Precautions / Restrictions Precautions Precautions: Fall Restrictions Weight Bearing Restrictions: No      Mobility  Bed Mobility Overal bed mobility: Needs Assistance Bed Mobility: Supine to Sit;Sit to Supine     Supine to sit: Min assist Sit to supine: Min assist   General bed mobility comments: Min A for trunk assist and LE assist. Pt requiring cues for sequencing to come to sitting.     Transfers Overall transfer level: Needs assistance Equipment used: 1 person hand held assist Transfers: Sit to/from Stand Sit to Stand: Min assist         General transfer comment: Min A for lift assist and steadying.   Ambulation/Gait Ambulation/Gait assistance: Min guard Gait Distance (Feet): 50 Feet Assistive device: 1 person hand held assist Gait Pattern/deviations: Step-through pattern;Decreased stride length Gait velocity: Decreased   General Gait Details: Easily distracted during mobility tasks. Min guard for steadying. Impulsive and making directional changes quickly.   Stairs            Wheelchair Mobility    Modified Rankin  (Stroke Patients Only)       Balance Overall balance assessment: Needs assistance Sitting-balance support: No upper extremity supported Sitting balance-Leahy Scale: Fair     Standing balance support: Single extremity supported;No upper extremity supported Standing balance-Leahy Scale: Fair Standing balance comment: Able to maintain static standing without UE support                              Pertinent Vitals/Pain Pain Assessment: Faces Faces Pain Scale: No hurt    Home Living Family/patient expects to be discharged to:: Assisted living                 Additional Comments: Per notes, from ALF     Prior Function           Comments: Unsure of baseline, however, during previous admission was using RW.      Hand Dominance        Extremity/Trunk Assessment   Upper Extremity Assessment Upper Extremity Assessment: Defer to OT evaluation    Lower Extremity Assessment Lower Extremity Assessment: Generalized weakness    Cervical / Trunk Assessment Cervical / Trunk Assessment: Kyphotic  Communication   Communication: Expressive difficulties  Cognition Arousal/Alertness: Awake/alert Behavior During Therapy: Impulsive Overall Cognitive Status: Impaired/Different from baseline Area of Impairment: Problem solving;Following commands;Memory;Attention;Safety/judgement;Awareness                   Current Attention Level: Focused Memory: Decreased short-term memory;Decreased recall of precautions Following Commands: Follows one step commands inconsistently Safety/Judgement: Decreased  awareness of safety;Decreased awareness of deficits Awareness: Intellectual Problem Solving: Requires verbal cues General Comments: Pt well known to this PT and seems pt is having more difficulty sequencing. Seems more impulsive and having increased difficulty following commands.       General Comments      Exercises     Assessment/Plan    PT Assessment  Patient needs continued PT services  PT Problem List Decreased strength;Decreased range of motion;Decreased activity tolerance;Decreased balance;Decreased mobility;Decreased cognition;Decreased knowledge of use of DME;Decreased safety awareness;Decreased knowledge of precautions       PT Treatment Interventions DME instruction;Gait training;Functional mobility training;Therapeutic activities;Therapeutic exercise;Balance training;Cognitive remediation;Patient/family education    PT Goals (Current goals can be found in the Care Plan section)  Acute Rehab PT Goals PT Goal Formulation: Patient unable to participate in goal setting Time For Goal Achievement: 07/21/20 Potential to Achieve Goals: Fair    Frequency Min 2X/week   Barriers to discharge Decreased caregiver support      Co-evaluation               AM-PAC PT "6 Clicks" Mobility  Outcome Measure Help needed turning from your back to your side while in a flat bed without using bedrails?: A Little Help needed moving from lying on your back to sitting on the side of a flat bed without using bedrails?: A Little Help needed moving to and from a bed to a chair (including a wheelchair)?: A Little Help needed standing up from a chair using your arms (e.g., wheelchair or bedside chair)?: A Little Help needed to walk in hospital room?: A Little Help needed climbing 3-5 steps with a railing? : A Lot 6 Click Score: 17    End of Session Equipment Utilized During Treatment: Gait belt Activity Tolerance: Patient tolerated treatment well Patient left: in bed;with call bell/phone within reach (on stretcher in ED ) Nurse Communication: Mobility status PT Visit Diagnosis: Unsteadiness on feet (R26.81);Muscle weakness (generalized) (M62.81)    Time: 7322-0254 PT Time Calculation (min) (ACUTE ONLY): 14 min   Charges:   PT Evaluation $PT Eval Moderate Complexity: 1 Mod          Reuel Derby, PT, DPT  Acute Rehabilitation Services   Pager: [phone] Office: [phone]   Rudean Hitt 07/07/2020, 1:58 PM

## 2020-07-07 NOTE — ED Notes (Signed)
MD at bedside. 

## 2020-07-07 NOTE — ED Notes (Signed)
Pt urinating in trash can. This is not typical for the pt as he knows where all the restrooms are. Pt seems to be more altered today than normal. Corey Settle, PA notified.

## 2020-07-07 NOTE — ED Notes (Signed)
Lunch tray ordered at 1010

## 2020-07-07 NOTE — Hospital Course (Addendum)
[name] is a 69 y.o. male who presented with altered mental status. PMH is significant for primary schizophrenia, HFpEF, HTN, HLD, BPH, tobacco abuse, GERD, history of hep C s/p [place], history of hep B cleared   Altered mental status  Schizophrenia Patient initially presented to ED with complaints of abdominal pain. Staff noticed he was acting abnormally, demonstrated by trying to urinate in a trashcan as well as decreased comprehension of his speech. Patient also seemed unsteady on his feet. Patient was extensively evaluated for etiologies of AMS including CBC, CMP, alcohol levels, and head CT which were all unremarkable. Patient's altered mental status thought to be due to underlying schizophrenia compounded by nonadherence to antipsychotic medication regimen. He was given his Fluphenazine injection on 11/22 while in the hospital because he missed his dose on 11/19. Next dose of Prolixin due 12/6. On 11/22 patient was at his baseline and ready for discharge back to [facility]. However, [facility] informed us they would not be accepting patient back to their facility, so he remained in the hospital until SNF bed obtained at [facility] on 12/3.    HTN Patient initially hypertensive in the ED with BP up to 170/112, likely due to medication noncompliance. The following day he was normotensive after receiving his home amlodipine. Remained normotensive throughout remainder of admission.  Hx of Displaced Right Fifth Metatarsal Fracture s/p ORIF  Patient with boot on R leg. Unfortunately missed follow up ortho appointment on 11/18. Discussed case with ortho on 12/1 who saw patient and stated that fracture boot could be removed. No additional imaging ordered. Weightbearing PT ordered.   The remainder of patient's chronic medical problems (BPH, CKD, GERD, anemia, Hx of DVT) were stable throughout his admission and he was maintained on his home medications.  Follow Up Concerns: 1. Due  for next dose of Prolixin on 12/6 2. ACT team to see patient outpatient 3. Repeat CBC next week thrombocytopenia and anemia 4. End date of Xarelto 12/15

## 2020-07-07 NOTE — H&P (Addendum)
[facility] Admission History and Physical Service Pager: [phone]  Patient name: [name] Medical record number: [id number] Date of birth: 12-24-1950 Age: 69 y.o. Gender: male  Primary Care Provider: Danna Hefty, DO Consultants: None  Code Status: Full Code  Preferred Emergency Contact: Wife, [phone]   Chief Complaint: AMS   Assessment and Plan:  [name] is a 69 y.o. male presenting with altered mental status. PMH is significant for primary schizophrenia, HFpEF,HTN, HLD, BPH, tobacco abuse, GERD, history of hep C s/p Harvoni, history of hep B cleared  Altered mental status  Schizophrenia Patient with history of frequent visits to ED via EMS during leave from ALF presents was noted to be difficult to understand with normal head CT imaging only notable for atrophy. LKN was one day ago, patient was noted to be able to eat normally earlier during the evening of this presentation. Patient does have a history of schizophrenia with echolalia and mumbling speech. Patient is resident of [facility] but has history of frequently leaving facility and calling EMS to be transported to the ED. On exam he is intermittently alert and oriented to self and city, unable to appreciate any deficits in strength however patient does follow commands.  He does not answer questions with comprehension beyond asking his name and the city.  Patient seems confused when asked if he is in the hospital and cannot pick from a list of answers.  Patient demonstrates resting tremor of upper and lower extremities and sluggish pupils.  Nursing reports that he is usually able to ambulate independently but has not been as steady on his feet since presenting overnight. Work up thus far notable for recent low glucose of 65 that improved to 86 upon recheck, normal ammonia of 31, EtOH <10. CBC, CMP, CT head showing generalized cerebral atrophy and no acute abnormality.  Vitals are  notable for temp of 98 and mildly elevated blood pressure to 151/93. COVID test was negative, HR and RR within normal ranges.  Home meds include Vistaril 50 mgQHS, Prolixin 25 mg injectionq14 days(last dose unknown at the time of admission),benztropine1 mg BID. Differential for patient's altered speech includes antipsychotic withdrawal, hypoglycemia and worsening schizophrenia in setting of unknown medication adherence, can also consider known accidental medication ingestion contributed to patient's altered mental status.  - admit to FPTS, progressive, attending Dr. Erin Hearing  - continue Cogentin - UDS - plan to recall [facility] to inquire about injectable (request Earlean Polka, [phone]) - up with assistance - consult PT/OT as patient may benefit from escalated care setting due to frequent trips to ED when leaving assisted-living facility - Consult transitions of care - Vitals per floor protocol - Tylenol as needed for pain - Neuro checks - A.m. CMP - f/u UA  Hypertension Patient with elevated blood pressures on admission to 151/93.  Blood pressure improved to 138/93, most recently 146/87.  Home medication includes amlodipine 10 mg daily. - Continue amlodipine  Thrombocytopenia: Chronic. History of low platelets from 100-200. Plts of 121 on admission.  Patient without any signs of trauma or bleeding on admission. - continue to monitor  Hx of Atrial Fibrillation  Patient noted to have atrial fibrillation historically upon chart review.  Do not appreciate irregular or tachycardia rhythm on auscultation during admission.  Patient with bradycardic heart rate as low as 52 on admission. -Cardiac monitoring -AM EKG    CKD IIIa: Cr 1.22 on admission, baseline 1.3-1.4.  - continue to monitor  HTN: Elevated BP on admission, with max 190/95. Home meds: Amlodipine 10mg  QD. History of taking Losartan in past. Repeat BP 158/90. Likely elevated BP 2/2 medication noncompliance given  last reported taking on 10/15. - restart home Amlodipine - continue to monitor  Normocytic Anemia: Chronic, stable. Hgb 10.4 on admission, baseline 10-11. No signs of acute bleed. May be in setting of chronic kidney disease. No anemia work up given stability. - continue to monitor - recommend outpatient colonoscopy and anemia workup - continue folic acid  BPH:  Chronic and stable. Medications include Flomax0.8 mg daily. -Continue home Flomax  Nicotine dependence Patient has history of nicotine dependence, on previous admission was on nicotine patch consistently. -nicotine patch per request  History of displaced fracture of fifth metatarsal on right foot Patient still wearing boot on right foot at the time of admission.  Patient is normally able to ambulate independently but now seems unsteady on his feet. -Continue to monitor  FEN/GI: NPO Prophylaxis: On Xarelto for DVT  Disposition: admit to progressive for AMS   History of Present Illness:  [name] is a 69 y.o. male presenting with altered mental status.  Patient is well known to [facility] emergency system and initially presented with complaint of abdominal pain but patient appeared to become more altered as evening went on.  Staff states that he is usually easily understood in regards to his speech but tonight they were having a difficult time understanding him.  Staff also reports that the patient is familiar with location of restrooms in the ED, but earlier tonight attempted to urinate in the trash can which is abnormal for him.  Patient has listed emergency contact as his spouse, unable to contact her for corroboration of HPI.  Patient noted to report to the ED on 11/18 reporting taking "extra pills" but later denied taking any extra medication and reported that he wanted to be checked out.  Patient had normal physical exam as well as normal labs and was discharged back to his living facility.  Staff reports that patient  frequently visits to ED on an almost daily basis, chart review is consistent with this.  During my interview, patient denies any current pain.  He did request a blanket as he was cold.  Staff repeatedly call patient's name to which he would respond and smile and follow instructions.  ED course: Last month had normal MRI. Ammonia is normal and etoh level is less than 10.  Review Of Systems: Per HPI with the following additions:   Review of Systems  Constitutional: Positive for activity change.  Neurological: Positive for tremors.  Psychiatric/Behavioral: Positive for confusion and decreased concentration.     Patient Active Problem List   Diagnosis Date Noted  . Altered mental status 07/07/2020  . Insomnia 06/21/2020  . Anxiety 06/18/2020  . Acute encephalopathy   . Cognitive impairment 06/12/2020  . AMS (altered mental status) 06/05/2020  . H/O noncompliance with medical treatment, presenting hazards to health   . DVT (deep venous thrombosis) ([place]) 06/04/2020  . Schizophrenia ([place])   . Homeless   . Dislocation of tarsometatarsal joint   . History of hepatitis B 04/26/2020  . Chronic hepatitis ([place])   . Dementia without behavioral disturbance ([place])   . Atrial fibrillation with RVR ([place]) 04/24/2020  . Malnutrition of moderate degree 04/24/2020  . History of drug abuse ([place]) 10/18/2017  . Tobacco abuse   . Diastolic heart failure ([place]) 02/05/2015  . Healthcare maintenance 05/28/2014  . Hyperlipidemia  03/10/2014  . [place] OCCLUSION 06/03/2010  . HYPERTENSION, BENIGN ESSENTIAL 01/17/2009  . BPH (benign prostatic hyperplasia) 01/17/2009  . History of hepatitis C 12/14/2008  . DEPRESSION 12/14/2008    Past Medical History: Past Medical History:  Diagnosis Date  . Acute encephalopathy 04/23/2020  . Acute metabolic encephalopathy 01/16/354  . AKI (acute kidney injury) ([place]) 05/12/2020  . BPH (benign prostatic hyperplasia)   . Closed displaced fracture of fifth  metatarsal bone of right foot   . Colon polyp 2010  . Crush injury   . Depression   . GERD (gastroesophageal reflux disease)   . Hepatitis C    "caught it when I had a blood transfusion"  . High cholesterol   . History of blood transfusion    "when I was young"  . Hypertension   . Paranoid schizophrenia ([place])   . PARANOID SCHIZOPHRENIA, CHRONIC 01/17/2009  . Prostate atrophy   . Retinal vein occlusion     Past Surgical History: Past Surgical History:  Procedure Laterality Date  . CIRCUMCISION  1959  . ORIF TOE FRACTURE Right 04/30/2020   Procedure: OPEN REDUCTION INTERNAL FIXATION (ORIF) RIGHT FOOT METATARSAL FRACTURES;  Surgeon: Newt Minion, MD;  Location: [facility];  Service: Orthopedics;  Laterality: Right;  . TONSILLECTOMY      Social History: Social History   Tobacco Use  . Smoking status: Current Every Day Smoker    Packs/day: 0.50    Years: 48.00    Pack years: 24.00    Types: Cigarettes  . Smokeless tobacco: Never Used  Substance Use Topics  . Alcohol use: Yes  . Drug use: No   Additional social history: patient presents from assisted living facility  Please also refer to relevant sections of EMR.  Family History: Family History  Problem Relation Age of Onset  . Hypertension Mother   . Diabetes Mother   . Stroke Mother     Allergies and Medications: Allergies  Allergen Reactions  . Lisinopril Other (See Comments)    Cough  . Penicillins Hives  . Amoxicillin Rash  . Ibuprofen Rash   Current Facility-Administered Medications on File Prior to Encounter  Medication Dose Route Frequency Provider Last Rate Last Admin  . fluPHENAZine decanoate (PROLIXIN) injection 25 mg  25 mg Intramuscular Q14 Days Nevada Crane, MD   25 mg at 05/23/20 1638   Current Outpatient Medications on File Prior to Encounter  Medication Sig Dispense Refill  . acetaminophen (TYLENOL) 325 MG tablet Take 2 tablets (650 mg total) by mouth every 6 (six) hours as needed for mild pain,  moderate pain or headache. 30 tablet 0  . amLODipine (NORVASC) 10 MG tablet Take 10 mg by mouth daily.    . benztropine (COGENTIN) 1 MG tablet Take 1 tablet (1 mg total) by mouth 2 (two) times daily. 60 tablet 0  . cetirizine (ZYRTEC ALLERGY) 10 MG tablet Take 1 tablet (10 mg total) by mouth daily. 30 tablet 1  . famotidine (PEPCID) 20 MG tablet Take 20 mg by mouth 2 (two) times daily.    . fluPHENAZine decanoate (PROLIXIN) 25 MG/ML injection Inject 1 mL (25 mg total) into the muscle every 14 (fourteen) days. Last dose 09/07/12 (Patient taking differently: Inject 25 mg into the muscle every 14 (fourteen) days. Last dose 05/09/2020) 5 mL 11  . fluticasone (FLONASE) 50 MCG/ACT nasal spray Place 2 sprays into both nostrils daily.    . folic acid (FOLVITE) 1 MG tablet Take 1 tablet (1 mg  total) by mouth daily. 30 tablet 0  . hydrOXYzine (ATARAX/VISTARIL) 25 MG tablet Take 1 tablet (25 mg total) by mouth at bedtime as needed for anxiety (Anxiety, Insomnia). 30 tablet 0  . Multiple Vitamin (MULTIVITAMIN WITH MINERALS) TABS tablet Take 1 tablet by mouth daily. 30 tablet 6  . polyethylene glycol (MIRALAX / GLYCOLAX) 17 g packet Take 17 g by mouth 2 (two) times daily.    . ramelteon (ROZEREM) 8 MG tablet Take 1 tablet (8 mg total) by mouth at bedtime. 30 tablet 0  . rivaroxaban (XARELTO) 20 MG TABS tablet Take 1 tablet (20 mg total) by mouth daily with supper. 30 tablet   . tamsulosin (FLOMAX) 0.4 MG CAPS capsule TAKE 2 CAPSULE BY MOUTH DAILY (Patient taking differently: Take 0.8 mg by mouth daily. ) 60 capsule 2  . thiamine 100 MG tablet Take 1 tablet (100 mg total) by mouth daily. 30 tablet 0  . traZODone (DESYREL) 50 MG tablet Take 1 tablet (50 mg total) by mouth at bedtime.    . vitamin C (ASCORBIC ACID) 500 MG tablet Take 500 mg by mouth daily.     . [DISCONTINUED] loratadine (CLARITIN) 10 MG tablet Take 1 tablet (10 mg total) by mouth daily. 30 tablet 0    Objective: BP (!) 154/96   Pulse (!) 55    Temp 98.6 F (37 C)   Resp 12   Ht 5\' 9"  (1.753 m)   Wt 64 kg   SpO2 100%   BMI 20.84 kg/m   Exam: General: Elderly male, lying in bed with low-frequency tremor, does not appear in acute distress Eyes: Extraocular muscles intact, mildly discolored sclera ENTM: Moist mucous membranes, poor dentition with multiple missing teeth Cardiovascular: Bradycardia, do not appreciate any murmurs Respiratory: No respiratory distress, no intercostal retractions, no wheezing, do not appreciate crackles Gastrointestinal: Soft, nontender, no masses palpated, bowel sounds present throughout MSK: Moves extremities with normal range of motion, right lower extremity in boot, right lower extremity with superior tibia swelling and multiple excoriations, no warmth to touch, no erythema, no lacerations Derm: Did not appreciate any rashes on extremities, torso, back Neuro: Intermittently alert, frequently falling asleep, oriented to name and city, follows one-step commands Psych: Patient appears pleasant when greeted by ED staff members  Labs and Imaging: CBC BMET  Recent Labs  Lab 07/07/20 0224  WBC 4.6  HGB 10.4*  HCT 31.7*  PLT 121*   Recent Labs  Lab 07/07/20 0224  NA 140  K 3.3*  CL 104  CO2 27  BUN 16  CREATININE 1.22  GLUCOSE 65*  CALCIUM 8.5*     EKG: No EKG completed today  CT Head Wo Contrast  Result Date: 07/07/2020 CLINICAL DATA:  Altered mental status. EXAM: CT HEAD WITHOUT CONTRAST TECHNIQUE: Contiguous axial images were obtained from the base of the skull through the vertex without intravenous contrast. COMPARISON:  June 04, 2020 FINDINGS: Brain: There is moderate severity cerebral atrophy with widening of the extra-axial spaces and ventricular dilatation. There are areas of decreased attenuation within the white matter tracts of the supratentorial brain, consistent with microvascular disease changes. A small, stable area of cortical encephalomalacia, with adjacent chronic  white matter low attenuation, is seen within the posterior parietal region on the left. Vascular: No hyperdense vessel or unexpected calcification. Skull: Normal. Negative for fracture or focal lesion. Sinuses/Orbits: No acute finding. Other: None. IMPRESSION: 1. Generalized cerebral atrophy. 2. No acute intracranial abnormality. Electronically Signed   By: Hoover Browns  [place] M.D.   On: 07/07/2020 02:59     Eulis Foster, MD 07/07/2020, 5:35 AM PGY-2, [place] Intern pager: [phone], text pages welcome

## 2020-07-07 NOTE — ED Notes (Signed)
SDU Breakfast Ordered 

## 2020-07-07 NOTE — Progress Notes (Signed)
Spoke with [facility] regarding patient's medication adherence.  They sent MR of his recent medications.  Fluphenazine is not marked that it was given.  His next dose should have been given 11/19.  Called emergency department pharmacy regarding if there was any way to tell if the patient had received the medication.  We determined it would have to have been recorded on the [facility] so it is likely that the patient has not received this medication.  1 dose ordered for the patient.  This will last him for 14 days meaning his next dose should be due December 5.

## 2020-07-07 NOTE — ED Notes (Signed)
Attempted to call report x 1  

## 2020-07-08 DIAGNOSIS — R4182 Altered mental status, unspecified: Secondary | ICD-10-CM | POA: Diagnosis not present

## 2020-07-08 MED ORDER — RAMELTEON 8 MG PO TABS
8.0000 mg | ORAL_TABLET | Freq: Every day | ORAL | Status: DC
  Administered 2020-07-08 – 2020-07-18 (×10): 8 mg via ORAL
  Filled 2020-07-08 (×12): qty 1

## 2020-07-08 MED ORDER — AMLODIPINE BESYLATE 10 MG PO TABS
10.0000 mg | ORAL_TABLET | Freq: Every day | ORAL | 0 refills | Status: DC
Start: 2020-07-08 — End: 2022-06-16

## 2020-07-08 MED ORDER — TRAZODONE HCL 50 MG PO TABS: 50.0000 mg | ORAL_TABLET | Freq: Every day | ORAL | Status: DC

## 2020-07-08 MED ORDER — HALOPERIDOL LACTATE 5 MG/ML IJ SOLN
1.0000 mg | Freq: Once | INTRAMUSCULAR | Status: AC | PRN
  Administered 2020-07-08: 1 mg via INTRAMUSCULAR
  Filled 2020-07-08: qty 1

## 2020-07-08 MED ORDER — TRAZODONE HCL 50 MG PO TABS
50.0000 mg | ORAL_TABLET | Freq: Every day | ORAL | Status: DC
  Administered 2020-07-08 – 2020-07-18 (×10): 50 mg via ORAL
  Filled 2020-07-08 (×11): qty 1

## 2020-07-08 MED ORDER — FAMOTIDINE 20 MG PO TABS
20.0000 mg | ORAL_TABLET | Freq: Two times a day (BID) | ORAL | Status: DC
Start: 2020-07-08 — End: 2022-06-16

## 2020-07-08 MED ORDER — FLUPHENAZINE DECANOATE 25 MG/ML IJ SOLN
25.0000 mg | Freq: Once | INTRAMUSCULAR | Status: AC
  Administered 2020-07-08: 25 mg via INTRAMUSCULAR
  Filled 2020-07-08: qty 1

## 2020-07-08 MED ORDER — HYDROXYZINE HCL 25 MG PO TABS
25.0000 mg | ORAL_TABLET | Freq: Every evening | ORAL | Status: DC | PRN
  Administered 2020-07-08 – 2020-07-15 (×2): 25 mg via ORAL
  Filled 2020-07-08 (×3): qty 1

## 2020-07-08 MED ORDER — FLUPHENAZINE DECANOATE 25 MG/ML IJ SOLN: 25.0000 mg | INTRAMUSCULAR | 11 refills | Status: DC

## 2020-07-08 NOTE — Progress Notes (Addendum)
Family Medicine Teaching Service Daily Progress Note Intern Pager: [phone]  Patient name: [name] Medical record number: [id number] Date of birth: Dec 25, 1950 Age: 69 y.o. Gender: male  Primary Care Provider: Danna Hefty, DO Consultants: None Code Status: FULL  Pt Overview and Major Events to Date:   11/21: admitted  Assessment and Plan:  [name] is a 70 y.o. male presenting with altered mental status. PMH is significant for primary schizophrenia, HFpEF,HTN, HLD, BPH, tobacco abuse, GERD, history of hep C s/p Harvoni, history of hep B cleared  Poorly Controlled Schizophrenia Patient is at his baseline this morning. Has a long hx of schizophrenia with medication noncompliance. At baseline he is oriented to person and place only with nearly unintelligible speech, occasional agitation, and frequent wandering. Per MAR from [facility], patient did not receive fluphenazine on 11/19. -Will consult psych re: increasing fluphenazine dose -Continue Cogentin -Appreciate social work assistance with dispo planning -1:1 sitter for wandering -Trazodone 50mg  nightly -Ramelteon 8mg  nightly -Atarax 25mg  prn at bedtime -No AM labs to prevent agitation overnight -PT/OT  Altered Mental Status- Resolved Per chart review, was less conversant and less aware of surroundings on admission. He is at his baseline this morning. -Continue to monitor -Schizophrenia management as above  HTN BP had been elevated in the ED up to 170/112. Overnight BP remained wnl, most recent 125/80. Home meds: amlodipine 10mg  daily (poor compliance) -Continue home Amlodipine 10mg  daily -Monitor BP  CKD Stage IIIa Chronic, stable. Cr 1.22 yesterday morning. Baseline Cr 1.3-1.4. -Continue to monitor  BPH Chronic, stable. Home meds: Flomax 0.8mg  daily. -Continue home Flomax 0.8mg  daily  Normocytic Anemia Hgb 10.4 on admission. Baseline appears to be ~11. -Continue to monitor  Hx of Displaced Right  Fifth Metatarsal Fracture s/p ORIF Ambulating without difficulty. Boot in place. -Continue to monitor  Hx of Provoked RLE DVT Recent RLE DVT on prior admission in October 2021, felt to be provoked s/p ORIF. He was started on Xarelto at that time (3 month course). -Continue home Xarelto  GERD Chronic, stable. -Continue home Pepcid 20mg  BID  FEN/GI: Heart healthy diet PPx: on Xarelto for hx of DVT  Status is: Observation The patient remains OBS appropriate and will d/c before 2 midnights.  Dispo: The patient is from: ALF              Anticipated d/c is to: ALF              Anticipated d/c date is: 1 day              Patient currently is not medically stable to d/c.   Subjective:  Overnight patient with one episode of agitation, given Haldol 1mg  x1. Patient has no complaints this morning. States he would like to go home today.  Objective: Temp:  [97.3 F (36.3 C)-98.5 F (36.9 C)] 98.5 F (36.9 C) (11/22 0411) Pulse Rate:  [51-88] 66 (11/22 0411) Resp:  [11-25] 18 (11/22 0411) BP: (116-177)/(65-138) 125/80 (11/22 0411) SpO2:  [77 %-100 %] 99 % (11/22 0411) Weight:  [64.3 kg] 64.3 kg (11/21 1925) Physical Exam: General: alert, NAD Cardiovascular: RRR, normal S1/S2 without m/r/g Respiratory: normal WOB on room air, lungs CTAB Abdomen: +BS, soft, nontender, nondistended Extremities: no peripheral edema Neuro: alert, oriented to person and place, CN II-VII intact, 5/5 strength in all extremities, mumbled speech consistent with his baseline   Laboratory: Recent Labs  Lab 07/04/20 1728 07/07/20 0224  WBC 5.0 4.6  HGB 10.8* 10.4*  HCT 34.1* 31.7*  PLT 140* 121*   Recent Labs  Lab 07/04/20 1728 07/07/20 0224 07/07/20 0640  NA 141 140 140  K 3.8 3.3* 3.7  CL 107 104 104  CO2 23 27 26   BUN 19 16 16   CREATININE 1.57* 1.22 1.22  CALCIUM 8.8* 8.5* 8.6*  PROT  --  6.0* 6.1*  BILITOT  --  0.9 1.0  ALKPHOS  --  38 37*  ALT  --  11 11  AST  --  17 18  GLUCOSE 146*  65* 88   UA: negative for bilirubin, glucose, Hgb, ketones, leukocytes, nitrites and protein. pH 6.0, Spec Grav 1.009 Tox Screen: negative   Imaging/Diagnostic Tests: No new imaging/diagnostic tests    Corey Dad, MD 07/08/2020, 6:05 AM PGY-1, [place] Intern pager: [phone], text pages welcome

## 2020-07-08 NOTE — Progress Notes (Addendum)
1545: CSW called [facility] to inform them of pt's ACT team appointment. [facility] staff person explained that pt was not allowed to return due to leaving ALF property frequently and wandering. CSW explained he was previously told that pt could return. Staff person stated that was not true and that she would follow up with her supervisor Damien Fusi and call CSW back.   1621: CSW did not receive a call back so CSW called [facility]. CSW is told that pt cannot return due to his wandering and that Damien Fusi was out and that CSW would need to call back tomorrow. [facility] staff explained that pt had signed a letter stating that if he were to leave the property again that he would be discharged. CSW pointed out that pt is not oriented to sign a letter or contract.

## 2020-07-08 NOTE — Discharge Summary (Signed)
[facility] Discharge Summary  Patient name: [name] Medical record number: [id number] Date of birth: 01-04-1951 Age: 69 y.o. Gender: male Date of Admission: 07/06/2020  Date of Discharge: 07/08/2020 Admitting Physician: Eulis Foster, MD  Primary Care Provider: Danna Hefty, DO Consultants: None  Indication for Hospitalization: Altered Mental Status  Discharge Diagnoses/Problem List:  Schizophrenia HTN CKD Stage IIIa BPH Normocytic Anemia Hx of Displaced Right Fifth Metatarsal Fracture s/p ORIF Hx of Provoked DVT GERD  Disposition: [facility]  Discharge Condition: Stable  Discharge Exam:  Gen: alert, no acute distress CV: RRR, normal S1/S2 without m/r/g Resp: normal WOB on room air, lungs CTAB Abd: +BS, soft, nontender, nondistended Ext: R boot in place, no obvious lower extremity edema Neuro: alert, oriented to person and place, CN II-VII intact, 5/5 strength in all extremities, mumbled speech consistent with his baseline   Brief Hospital Course:   [name] is a 69 y.o. male who presented with altered mental status. PMH is significant for primary schizophrenia, HFpEF, HTN, HLD, BPH, tobacco abuse, GERD, history of hep C s/p Harvoni, history of hep B cleared   Altered mental status  Schizophrenia Patient initially presented to ED with complaints of abdominal pain. Staff noticed he was acting abnormally, demonstrated by trying to urinate in a trashcan as well as decreased comprehension of his speech. Patient also seemed unsteady on his feet. Patient was extensively evaluated for etiologies of AMS including CBC, CMP, alcohol levels, and head CT which were all unremarkable. Patient's altered mental status thought to be due to underlying schizophrenia compounded by nonadherence to antipsychotic medication regimen.  The following morning patient was at his baseline and ready for discharge back to [facility]. He was given his Fluphenazine injection on 11/22 while in the hospital because he missed his dose on 11/19. Next dose of Prolixin due 12/6.  HTN Patient initially hypertensive in the ED with BP up to 170/112, likely due to medication noncompliance. The following day he was normotensive after receiving his home amlodipine.   The remainder of patient's chronic medical problems (BPH, CKD, GERD, anemia, Hx of DVT) were stable throughout his admission and he was maintained on his home medications.   Issues for Follow Up:  1. Ensure patient receives ACT team follow up and next dose of Prolixin (12/6). Consider increasing dose of Prolixin to help manage frequent wandering.  Significant Procedures: None  Significant Labs and Imaging:  Recent Labs  Lab 07/04/20 1728 07/07/20 0224  WBC 5.0 4.6  HGB 10.8* 10.4*  HCT 34.1* 31.7*  PLT 140* 121*   Recent Labs  Lab 07/04/20 1728 07/04/20 1728 07/07/20 0224 07/07/20 0640  NA 141  --  140 140  K 3.8   < > 3.3* 3.7  CL 107  --  104 104  CO2 23  --  27 26  GLUCOSE 146*  --  65* 88  BUN 19  --  16 16  CREATININE 1.57*  --  1.22 1.22  CALCIUM 8.8*  --  8.5* 8.6*  ALKPHOS  --   --  38 37*  AST  --   --  17 18  ALT  --   --  11 11  ALBUMIN  --   --  3.5 3.6   < > = values in this interval not displayed.    Results/Tests Pending at Time of Discharge: None  Discharge Medications:  Allergies as of 07/08/2020  Reactions   Lisinopril Other (See Comments)   Cough   Penicillins Hives   Amoxicillin Rash   Ibuprofen Rash      Medication List    TAKE these medications   acetaminophen 325 MG tablet Commonly known as: TYLENOL Take 2 tablets (650 mg total) by mouth every 6 (six) hours as needed for mild pain, moderate pain or headache.   amLODipine 10 MG tablet Commonly known as: NORVASC Take 1 tablet (10 mg total) by mouth daily.   benztropine 1 MG tablet Commonly known as: COGENTIN Take 1 tablet (1 mg total) by  mouth 2 (two) times daily.   cetirizine 10 MG tablet Commonly known as: ZyrTEC Allergy Take 1 tablet (10 mg total) by mouth daily.   famotidine 20 MG tablet Commonly known as: PEPCID Take 1 tablet (20 mg total) by mouth 2 (two) times daily.   fluPHENAZine decanoate 25 MG/ML injection Commonly known as: PROLIXIN Inject 1 mL (25 mg total) into the muscle every 14 (fourteen) days. Last dose 07/08/2020. Next dose due 07/22/2020. What changed: additional instructions   folic acid 1 MG tablet Commonly known as: FOLVITE Take 1 tablet (1 mg total) by mouth daily.   hydrOXYzine 25 MG tablet Commonly known as: ATARAX/VISTARIL Take 1 tablet (25 mg total) by mouth at bedtime as needed for anxiety (Anxiety, Insomnia).   multivitamin with minerals Tabs tablet Take 1 tablet by mouth daily.   nicotine 14 mg/24hr patch Commonly known as: NICODERM CQ - dosed in mg/24 hours Place 14 mg onto the skin daily.   ramelteon 8 MG tablet Commonly known as: ROZEREM Take 1 tablet (8 mg total) by mouth at bedtime.   rivaroxaban 20 MG Tabs tablet Commonly known as: XARELTO Take 1 tablet (20 mg total) by mouth daily with supper.   tamsulosin 0.4 MG Caps capsule Commonly known as: FLOMAX TAKE 2 CAPSULE BY MOUTH DAILY What changed: See the new instructions.   thiamine 100 MG tablet Take 1 tablet (100 mg total) by mouth daily.   traZODone 50 MG tablet Commonly known as: DESYREL Take 1 tablet (50 mg total) by mouth at bedtime.   vitamin C 500 MG tablet Commonly known as: ASCORBIC ACID Take 500 mg by mouth daily.       Discharge Instructions: Please refer to Patient Instructions section of EMR for full details.  Patient was counseled important signs and symptoms that should prompt return to medical care, changes in medications, dietary instructions, activity restrictions, and follow up appointments.   Follow-Up Appointments:  Follow-up Information    [facility], [facility]. Go on 07/16/2020.    Why: You have an ACT Team assessment on Tuesday 07/16/20 at 1030am. This appiontment is in person at [facility] office.  Contact information: [address] [place] [address] [phone]               Alcus Dad, MD 07/08/2020, 3:03 PM PGY-1, [place]

## 2020-07-08 NOTE — Progress Notes (Signed)
CSW called [employer]. [facility] confirmed pt could return today.   CSW called [facility] who informed CSW that pt did not attend his ACT team assessment. They reschedule pt's appointment to Tuesday 07/16/20 at 1030am. This appointment will be in person at [facility].

## 2020-07-08 NOTE — Progress Notes (Signed)
FPTS Interim Progress Note  S:  Received page from nurse about patient becoming agitated and fighting to get out of bed.  When arrived to room, nurses indicated patient calmed down.  Spoke with patient and is cutrrently resting comfortably. resting comfortably  O: BP 129/81 (BP Location: Right Arm)   Pulse 61   Temp 98.3 F (36.8 C) (Oral)   Resp 16   Ht 5\' 9"  (1.753 m)   Wt 64.3 kg   SpO2 98%   BMI 20.93 kg/m     A/P: AMS - Put in order for Haldol 1 mg qd PRN if patient becomes restless again  Delora Fuel, MD 07/08/2020, 3:16 AM PGY-1, [place] [facility] Service pager [phone]

## 2020-07-08 NOTE — Progress Notes (Signed)
Physical Therapy Treatment Patient Details Name: [name] MRN: [id number] DOB: [birth year] Today's Date: 07/08/2020    History of Present Illness Pt is a 69 y/o male presenting with AMS. Workup pending. PMH includes schizophrenia, R foot ORIF, tobacco use, HTN and hep C.     PT Comments    Pt progressing towards goals. Seemed to be less impulsive this session, however, remained easily distracted. Responded well to directional cues. Min to min guard A for stability this session. Per notes, pt to return to [facility] at d/c.  If ALF unable to take him, will require SNF . Will continue to follow acutely.    Follow Up Recommendations  SNF;Supervision/Assistance - 24 hour (unless ALF can provide necessary support )     Equipment Recommendations  None recommended by PT    Recommendations for Other Services       Precautions / Restrictions Precautions Precautions: Fall Required Braces or Orthoses: Other Brace Other Brace: Had CAM walker boot for RLE  Restrictions Weight Bearing Restrictions: No    Mobility  Bed Mobility Overal bed mobility: Needs Assistance Bed Mobility: Supine to Sit;Sit to Supine     Supine to sit: Supervision Sit to supine: Min guard   General bed mobility comments: Min guard for safety to return to supine. Pt required increased time to follow commands to return to supine. Pt holding tightly to PT gait belt and required increased time to let it go.   Transfers Overall transfer level: Needs assistance Equipment used: 1 person hand held assist Transfers: Sit to/from Stand Sit to Stand: Min assist         General transfer comment: Min A for steadying assist.   Ambulation/Gait Ambulation/Gait assistance: Min guard Gait Distance (Feet): 75 Feet Assistive device: 1 person hand held assist Gait Pattern/deviations: Step-to pattern;Step-through pattern;Decreased weight shift to right Gait velocity: Decreased   General Gait Details: Continues to be  easily distracted, but responded well to directional cues. Min guard for steadying assist.    Stairs             Wheelchair Mobility    Modified Rankin (Stroke Patients Only)       Balance Overall balance assessment: Needs assistance Sitting-balance support: No upper extremity supported Sitting balance-Leahy Scale: Fair     Standing balance support: Single extremity supported Standing balance-Leahy Scale: Fair Standing balance comment: Able to maintain static standing without UE support                             Cognition Arousal/Alertness: Awake/alert Behavior During Therapy: WFL for tasks assessed/performed Overall Cognitive Status: Difficult to assess                                 General Comments: Pt less impulsive today, however, was still very easily distracted. Likely close to his baseline.       Exercises      General Comments        Pertinent Vitals/Pain Pain Assessment: Faces Faces Pain Scale: No hurt    Home Living                      Prior Function            PT Goals (current goals can now be found in the care plan section) Acute Rehab PT Goals Patient Stated Goal: none  PT Goal Formulation: Patient unable to participate in goal setting Time For Goal Achievement: 07/21/20 Potential to Achieve Goals: Fair Progress towards PT goals: Progressing toward goals    Frequency    Min 2X/week      PT Plan Current plan remains appropriate    Co-evaluation              AM-PAC PT "6 Clicks" Mobility   Outcome Measure  Help needed turning from your back to your side while in a flat bed without using bedrails?: None Help needed moving from lying on your back to sitting on the side of a flat bed without using bedrails?: None Help needed moving to and from a bed to a chair (including a wheelchair)?: A Little Help needed standing up from a chair using your arms (e.g., wheelchair or bedside chair)?:  A Little Help needed to walk in hospital room?: A Little Help needed climbing 3-5 steps with a railing? : A Lot 6 Click Score: 19    End of Session Equipment Utilized During Treatment: Gait belt Activity Tolerance: Patient tolerated treatment well Patient left: in bed;with call bell/phone within reach;with bed alarm set Nurse Communication: Mobility status PT Visit Diagnosis: Unsteadiness on feet (R26.81);Muscle weakness (generalized) (M62.81)     Time: 9628-3662 PT Time Calculation (min) (ACUTE ONLY): 17 min  Charges:  $Gait Training: 8-22 mins                     Reuel Derby, PT, DPT  Acute Rehabilitation Services  Pager: [phone] Office: [phone]    Rudean Hitt 07/08/2020, 12:22 PM

## 2020-07-08 NOTE — Care Management Obs Status (Signed)
[facility] NOTIFICATION   Patient Details  Name: [name] MRN: [id number] Date of Birth: [birth year]   Medicare Observation Status Notification Given:  Yes    Pollie Friar, RN 07/08/2020, 3:32 PM

## 2020-07-08 NOTE — Progress Notes (Signed)
OT NOTE  OT screening patient via [facility] and [facility] [place]. OT eval to be screened.    Fleeta Emmer, OTR/L  Acute Rehabilitation Services Pager: [phone] Office: [phone] .

## 2020-07-08 NOTE — Plan of Care (Signed)
Adequate for discharge.

## 2020-07-09 DIAGNOSIS — Z7901 Long term (current) use of anticoagulants: Secondary | ICD-10-CM | POA: Diagnosis not present

## 2020-07-09 DIAGNOSIS — G4700 Insomnia, unspecified: Secondary | ICD-10-CM | POA: Diagnosis present

## 2020-07-09 DIAGNOSIS — D631 Anemia in chronic kidney disease: Secondary | ICD-10-CM | POA: Diagnosis present

## 2020-07-09 DIAGNOSIS — D696 Thrombocytopenia, unspecified: Secondary | ICD-10-CM | POA: Diagnosis present

## 2020-07-09 DIAGNOSIS — F32A Depression, unspecified: Secondary | ICD-10-CM | POA: Diagnosis present

## 2020-07-09 DIAGNOSIS — F419 Anxiety disorder, unspecified: Secondary | ICD-10-CM | POA: Diagnosis present

## 2020-07-09 DIAGNOSIS — N1831 Chronic kidney disease, stage 3a: Secondary | ICD-10-CM | POA: Diagnosis present

## 2020-07-09 DIAGNOSIS — Z89512 Acquired absence of left leg below knee: Secondary | ICD-10-CM | POA: Diagnosis not present

## 2020-07-09 DIAGNOSIS — Z880 Allergy status to penicillin: Secondary | ICD-10-CM | POA: Diagnosis not present

## 2020-07-09 DIAGNOSIS — Z86718 Personal history of other venous thrombosis and embolism: Secondary | ICD-10-CM | POA: Diagnosis not present

## 2020-07-09 DIAGNOSIS — Z8719 Personal history of other diseases of the digestive system: Secondary | ICD-10-CM | POA: Diagnosis not present

## 2020-07-09 DIAGNOSIS — K219 Gastro-esophageal reflux disease without esophagitis: Secondary | ICD-10-CM | POA: Diagnosis present

## 2020-07-09 DIAGNOSIS — E785 Hyperlipidemia, unspecified: Secondary | ICD-10-CM | POA: Diagnosis present

## 2020-07-09 DIAGNOSIS — I130 Hypertensive heart and chronic kidney disease with heart failure and stage 1 through stage 4 chronic kidney disease, or unspecified chronic kidney disease: Secondary | ICD-10-CM | POA: Diagnosis present

## 2020-07-09 DIAGNOSIS — Z886 Allergy status to analgesic agent status: Secondary | ICD-10-CM | POA: Diagnosis not present

## 2020-07-09 DIAGNOSIS — R4182 Altered mental status, unspecified: Secondary | ICD-10-CM | POA: Diagnosis present

## 2020-07-09 DIAGNOSIS — N400 Enlarged prostate without lower urinary tract symptoms: Secondary | ICD-10-CM | POA: Diagnosis present

## 2020-07-09 DIAGNOSIS — Z888 Allergy status to other drugs, medicaments and biological substances status: Secondary | ICD-10-CM | POA: Diagnosis not present

## 2020-07-09 DIAGNOSIS — F200 Paranoid schizophrenia: Secondary | ICD-10-CM | POA: Diagnosis present

## 2020-07-09 DIAGNOSIS — Z9114 Patient's other noncompliance with medication regimen: Secondary | ICD-10-CM | POA: Diagnosis not present

## 2020-07-09 DIAGNOSIS — S92351D Displaced fracture of fifth metatarsal bone, right foot, subsequent encounter for fracture with routine healing: Secondary | ICD-10-CM | POA: Diagnosis not present

## 2020-07-09 DIAGNOSIS — R109 Unspecified abdominal pain: Secondary | ICD-10-CM | POA: Diagnosis present

## 2020-07-09 DIAGNOSIS — I5032 Chronic diastolic (congestive) heart failure: Secondary | ICD-10-CM | POA: Diagnosis present

## 2020-07-09 DIAGNOSIS — Z20822 Contact with and (suspected) exposure to covid-19: Secondary | ICD-10-CM | POA: Diagnosis present

## 2020-07-09 DIAGNOSIS — I739 Peripheral vascular disease, unspecified: Secondary | ICD-10-CM | POA: Diagnosis present

## 2020-07-09 NOTE — Progress Notes (Signed)
CSW called [facility] and left a message requesting return call. CSW requesting information regarding where pt's Social Security check was going because pt would need this to get into another facility.

## 2020-07-09 NOTE — NC FL2 (Signed)
[place] LEVEL OF CARE SCREENING TOOL     IDENTIFICATION  Patient Name: [name] Birthdate: 04-20-1951 Sex: male Admission Date (Current Location): 07/06/2020  [facility] and [place] Number:  Herbalist and Address:  [facility] [place] [address] [place], [place] [address]      Provider Number: [id number]  Attending Physician Name and Address:  Lind Covert, MD  Relative Name and Phone Number:  Gillison,Angela    Current Level of Care: Hospital Recommended Level of Care: [place] Prior Approval Number:    Date Approved/Denied:   PASRR Number: [id number]  Discharge Plan: SNF    Current Diagnoses: Patient Active Problem List   Diagnosis Date Noted  . Altered mental status 07/07/2020  . Insomnia 06/21/2020  . Anxiety 06/18/2020  . Acute encephalopathy   . Cognitive impairment 06/12/2020  . AMS (altered mental status) 06/05/2020  . H/O noncompliance with medical treatment, presenting hazards to health   . DVT (deep venous thrombosis) ([place]) 06/04/2020  . Schizophrenia ([place])   . Homeless   . Dislocation of tarsometatarsal joint   . History of hepatitis B 04/26/2020  . Chronic hepatitis ([place])   . Dementia without behavioral disturbance ([place])   . Atrial fibrillation with RVR ([place]) 04/24/2020  . Malnutrition of moderate degree 04/24/2020  . History of drug abuse ([place]) 10/18/2017  . Tobacco abuse   . Diastolic heart failure ([place]) 02/05/2015  . Healthcare maintenance 05/28/2014  . Hyperlipidemia 03/10/2014  . [place] OCCLUSION 06/03/2010  . HYPERTENSION, BENIGN ESSENTIAL 01/17/2009  . BPH (benign prostatic hyperplasia) 01/17/2009  . History of hepatitis C 12/14/2008  . DEPRESSION 12/14/2008    Orientation RESPIRATION BLADDER Height & Weight     Self  Normal Continent Weight: 141 lb 12.1 oz (64.3 kg) Height:  5\' 9"  (175.3 cm)  BEHAVIORAL SYMPTOMS/MOOD NEUROLOGICAL BOWEL NUTRITION  STATUS  Wanderer   Continent Diet (see d/c summary)  AMBULATORY STATUS COMMUNICATION OF NEEDS Skin   Limited Assist Verbally Surgical wounds                       Personal Care Assistance Level of Assistance  Bathing, Feeding, Dressing Bathing Assistance: Limited assistance Feeding assistance: Independent Dressing Assistance: Limited assistance     Functional Limitations Info  Sight, Hearing, Speech Sight Info: Impaired Hearing Info: Adequate Speech Info: Adequate    SPECIAL CARE FACTORS FREQUENCY  PT (By licensed PT), OT (By licensed OT)     PT Frequency: 5x/week OT Frequency: 5x/week            Contractures Contractures Info: Not present    Additional Factors Info  Code Status, Allergies Code Status Info: Full code Allergies Info: Penicillian, amoxicillian, ibuprophen, lisinopril           Current Medications (07/09/2020):  This is the current hospital active medication list Current Facility-Administered Medications  Medication Dose Route Frequency Provider Last Rate Last Admin  . acetaminophen (TYLENOL) tablet 650 mg  650 mg Oral Q6H PRN Simmons-Robinson, Makiera, MD       Or  . acetaminophen (TYLENOL) suppository 650 mg  650 mg Rectal Q6H PRN Simmons-Robinson, Makiera, MD      . amLODipine (NORVASC) tablet 10 mg  10 mg Oral Daily Simmons-Robinson, Makiera, MD   10 mg at 07/09/20 0951  . benztropine (COGENTIN) tablet 1 mg  1 mg Oral BID Simmons-Robinson, Makiera, MD   1 mg at 07/09/20 0951  .  famotidine (PEPCID) tablet 20 mg  20 mg Oral BID Simmons-Robinson, Makiera, MD   20 mg at 07/09/20 0950  . fluticasone (FLONASE) 50 MCG/ACT nasal spray 2 spray  2 spray Each Nare Daily Simmons-Robinson, Makiera, MD   2 spray at 07/09/20 0951  . hydrOXYzine (ATARAX/VISTARIL) tablet 25 mg  25 mg Oral QHS PRN Wilber Oliphant, MD   25 mg at 07/08/20 1916  . nicotine (NICODERM CQ - dosed in mg/24 hr) patch 7 mg  7 mg Transdermal Daily Simmons-Robinson, Makiera, MD   7 mg at  07/09/20 0951  . ramelteon (ROZEREM) tablet 8 mg  8 mg Oral QHS Wilber Oliphant, MD   8 mg at 07/08/20 2113  . rivaroxaban (XARELTO) tablet 20 mg  20 mg Oral Q supper Simmons-Robinson, Makiera, MD   20 mg at 07/08/20 1700  . tamsulosin (FLOMAX) capsule 0.8 mg  0.8 mg Oral Daily Simmons-Robinson, Makiera, MD   0.8 mg at 07/09/20 0951  . thiamine tablet 100 mg  100 mg Oral Daily Simmons-Robinson, Makiera, MD   100 mg at 07/09/20 0951  . traZODone (DESYREL) tablet 50 mg  50 mg Oral QHS Wilber Oliphant, MD   50 mg at 07/08/20 2113     Discharge Medications: Please see discharge summary for a list of discharge medications.  Relevant Imaging Results:  Relevant Lab Results:   Additional Information SSN: [id number]  Bethann Berkshire, LCSW

## 2020-07-09 NOTE — Progress Notes (Signed)
Family Medicine Teaching Service Daily Progress Note Intern Pager: [phone]  Patient name: [name] Medical record number: [id number] Date of birth: [birth year] Age: 69 y.o. Gender: male  Primary Care Provider: Danna Hefty, DO Consultants: None Code Status: FULL  Pt Overview and Major Events to Date:  11/21: admitted 11/22: medically stable for discharge, informed [facility] will not accept patient back  Assessment and Plan:  KESHAN REHA a 69 y.o.malepresenting with altered mental status. PMH is significant forschizophrenia, HFpEF,HTN, HLD, BPH, tobacco abuse, GERD, history of hep C s/p Harvoni, and history of hep B cleared.  Poorly Controlled Schizophrenia At baseline this morning. Informed patient would not be accepted back to [facility] (reportedly signed letter that he would be discharged if he wandered away again). -s/p Fluphenazine 25mg  IM on 11/22 -Continue Cogentin -Trazodone 50mg  nightly, Ramelteon 8mg  nightly, Ataraz 25mg  prn at bedtime for agitation -Appreciate social work assistance with dispo planning  HTN BPs overall have been stable and well controlled over past 48h. Most recent 135/94. Home meds: Amlodipine 10mg  daily (poor compliance) -Continue home amlodipine -Monitor BP  CKD Stage IIIa (baseline Cr 1.3-1.4) -Avoid nephrotoxic meds  BPH: chronic, stable Home meds: Flomax 0.8mg  daily -Continue home Flomax  Hx of Displaced Right Fifth Metatarsal Fracture s/p ORIF Ambulating without difficulty. Boot in place. -Continue to monitor  Hx of Provoked RLE DVT Recent RLE DVT on prior admission in October 2021, felt to be provoked s/p ORIF. He was started on Xarelto at that time (3 month course). -Continue home Xarelto  GERD Chronic, stable. -Continue home Pepcid 20mg  BID  FEN/GI: Heart healthy diet PPx: on Xarelto (hx of DVT)   Status is: Observation The patient remains OBS appropriate and will d/c before 2 midnights.  Dispo: The patient  is from: ALF              Anticipated d/c is to: TBD              Anticipated d/c date is: 1 day              Patient currently is medically stable to d/c.    Subjective:  No acute events overnight. Patient denies complaints this morning.   Objective: Temp:  [98 F (36.7 C)-99.5 F (37.5 C)] 98 F (36.7 C) (11/23 0310) Pulse Rate:  [61-76] 73 (11/23 0310) Resp:  [16-18] 18 (11/23 0310) BP: (113-155)/(66-92) 133/69 (11/23 0310) SpO2:  [97 %-100 %] 99 % (11/23 0310) Physical Exam: General: sleepy but easily arousable, NAD Cardiovascular: RRR, normal S1/S2 without m/r/g Respiratory: normal WOB on room air, lungs CTAB Abdomen: +BS, soft, nontender, nondistended Extremities: no peripheral edema Neuro: grossly intact, oriented to person only, speech mumbled per baseline  Laboratory: Recent Labs  Lab 07/04/20 1728 07/07/20 0224  WBC 5.0 4.6  HGB 10.8* 10.4*  HCT 34.1* 31.7*  PLT 140* 121*   Recent Labs  Lab 07/04/20 1728 07/07/20 0224 07/07/20 0640  NA 141 140 140  K 3.8 3.3* 3.7  CL 107 104 104  CO2 23 27 26   BUN 19 16 16   CREATININE 1.57* 1.22 1.22  CALCIUM 8.8* 8.5* 8.6*  PROT  --  6.0* 6.1*  BILITOT  --  0.9 1.0  ALKPHOS  --  38 37*  ALT  --  11 11  AST  --  17 18  GLUCOSE 146* 65* 88    Imaging/Diagnostic Tests: No new imaging/diagnostic tests.   Alcus Dad, MD 07/09/2020, 6:27 AM PGY-1, [place] [place]  [place] Intern pager: [phone], text pages welcome

## 2020-07-09 NOTE — TOC Initial Note (Signed)
Transition of Care [facility]) - Initial/Assessment Note    Patient Details  Name: [name] MRN: [id number] Date of Birth: 1951/06/16  [facility] CM/SW Contact:    Bethann Berkshire, [place] Phone Number: 07/09/2020, 4:52 PM  Clinical Narrative:                 Per pt's wife during last admission, she is okay with pt going to SNF or ALF if recommended. CSW completed Fl2 and faxed bed requests.   Expected Discharge Plan: Skilled Nursing Facility Barriers to Discharge: SNF Pending bed offer   Patient Goals and CMS Choice Patient states their goals for this hospitalization and ongoing recovery are:: Wife okay with SNF or ALF per last admission      Expected Discharge Plan and Services Expected Discharge Plan: [place]         Expected Discharge Date: 07/08/20                                    Prior Living Arrangements/Services              Need for Family Participation in Patient Care: Yes (Comment) Care giver support system in place?: No (comment)      Activities of Daily Living      Permission Sought/Granted                  Emotional Assessment Appearance:: Appears stated age, Well-Groomed     Orientation: : Oriented to Self Alcohol / Substance Use: Not Applicable Psych Involvement: Yes (comment)  Admission diagnosis:  Altered mental status [R41.82] Altered mental status, unspecified altered mental status type [R41.82] Patient Active Problem List   Diagnosis Date Noted  . Altered mental status 07/07/2020  . Insomnia 06/21/2020  . Anxiety 06/18/2020  . Acute encephalopathy   . Cognitive impairment 06/12/2020  . AMS (altered mental status) 06/05/2020  . H/O noncompliance with medical treatment, presenting hazards to health   . DVT (deep venous thrombosis) ([place]) 06/04/2020  . Schizophrenia ([place])   . Homeless   . Dislocation of tarsometatarsal joint   . History of hepatitis B 04/26/2020  . Chronic hepatitis ([place])    . Dementia without behavioral disturbance ([place])   . Atrial fibrillation with RVR ([place]) 04/24/2020  . Malnutrition of moderate degree 04/24/2020  . History of drug abuse ([place]) 10/18/2017  . Tobacco abuse   . Diastolic heart failure ([place]) 02/05/2015  . Healthcare maintenance 05/28/2014  . Hyperlipidemia 03/10/2014  . [place] OCCLUSION 06/03/2010  . HYPERTENSION, BENIGN ESSENTIAL 01/17/2009  . BPH (benign prostatic hyperplasia) 01/17/2009  . History of hepatitis C 12/14/2008  . DEPRESSION 12/14/2008   PCP:  Danna Hefty, DO Pharmacy:   [facility], [place] [address] Phone: [phone] Fax: [phone]  [id number] - [place], [place] - [address] [place] [address] Phone: [phone] Fax: [phone]  [id number] - [place], [place] - [address] [place] [address] Phone: [phone] Fax: [place], [place] - [address] [place] [address] Phone: [phone] Fax: [phone]  [place], [place] - [address]  [facility] [address] [place] [address] Phone: [phone] Fax: [phone]     Social Determinants of Health (SDOH) Interventions    Readmission Risk Interventions No flowsheet data found.

## 2020-07-10 DIAGNOSIS — F200 Paranoid schizophrenia: Secondary | ICD-10-CM | POA: Diagnosis not present

## 2020-07-10 DIAGNOSIS — R4182 Altered mental status, unspecified: Secondary | ICD-10-CM | POA: Diagnosis not present

## 2020-07-10 NOTE — Progress Notes (Signed)
CSW called [facility] to f/u on their acceptance of pt in hub. CSW clarified with [facility] that they are unable to accept pt's with wandering behaviors. [facility] unable to accept. [facility] will change status in hub.

## 2020-07-10 NOTE — Progress Notes (Signed)
Family Medicine Teaching Service Daily Progress Note Intern Pager: [phone]  Patient name: [name] Medical record number: [id number] Date of birth: Dec 28, 1950 Age: 69 y.o. Gender: male  Primary Care Provider: Danna Hefty, DO Consultants: None Code Status: FULL  Pt Overview and Major Events to Date:  11/21: admitted 11/22: medically stable for discharge  Assessment and Plan:  [name] a 69 y.o.malewho presented with altered mental status. PMH is significant forschizophrenia, HFpEF,HTN, HLD, BPH, tobacco abuse, GERD, history of hep C s/p Harvoni, and history of hep B cleared. Awaiting SNF placement.  Poorly Controlled Schizophrenia  Altered Mental Status-resolved Patient at baseline this morning. He is oriented to person and place. Speech is fairly intelligible compared to prior.  -s/p Fluphenazine 25mg  IM on 11/22 (next dose due 12/6) -Continue Cogentin -Trazodone 50mg  and Ramelteon 8mg  nightly -Atarax 25mg  prn at bedtime -1:1 sitter as needed for wandering and agitation -Social work following, appreciate assistance with SNF placement -PT eval and treat  HTN BP elevated to 155/80 this morning. Otherwise has remained within normal limits. -Continue home amlodipine 10mg  daily -Monitor BP  BPH: chronic, stable -Continue home Flomax 0.8mg  daily  Hx of Displaced Right Fifth Metatarsal Fractures/p ORIF Ambulating slowly with short stride, but otherwise normal gait. Boot in place. -Continue to monitor -PT eval and treat  Hx of Provoked RLE DVT Recent RLE DVT on prior admission in October 2021, felt to be provoked s/p ORIF. He was started on Xarelto at that time (3 month course). -Continue home Xarelto  GERD Chronic, stable. -Continue home Pepcid 20mg  BID  Tobacco Use -Nicotine patch daily  FEN/GI: Heart healthy diet PPx: Xarelto for hx of DVT   Status is: Inpatient Remains inpatient appropriate because:Unsafe d/c plan  Dispo: The patient  is from: ALF              Anticipated d/c is to: SNF              Anticipated d/c date is: > 3 days              Patient currently is medically stable to d/c.   Subjective:  No acute events overnight. Patient has no complaints this morning. States his wife would like him to come home.  Objective: Temp:  [97.5 F (36.4 C)-98.6 F (37 C)] 98.6 F (37 C) (11/24 0406) Pulse Rate:  [56-79] 56 (11/24 0406) Resp:  [18-19] 19 (11/24 0406) BP: (112-155)/(69-94) 155/80 (11/24 0406) SpO2:  [100 %] 100 % (11/24 0406) Physical Exam: General: alert, no acute distress Cardiovascular: RRR, normal S1/S2 without m/r/g Respiratory: normal WOB on room air, lungs CTAB Abdomen: +BS, soft, nontender, nondistended Extremities: boot in place on R foot, no peripheral edema Neuro: oriented to person, place, and year but not month. Ambulating with shuffling gait but is steady on his feet. Appropriate responses to my questions. Speech still mumbled and sometimes repetitive but somewhat more intelligible than prior  Laboratory: Recent Labs  Lab 07/04/20 1728 07/07/20 0224  WBC 5.0 4.6  HGB 10.8* 10.4*  HCT 34.1* 31.7*  PLT 140* 121*   Recent Labs  Lab 07/04/20 1728 07/07/20 0224 07/07/20 0640  NA 141 140 140  K 3.8 3.3* 3.7  CL 107 104 104  CO2 23 27 26   BUN 19 16 16   CREATININE 1.57* 1.22 1.22  CALCIUM 8.8* 8.5* 8.6*  PROT  --  6.0* 6.1*  BILITOT  --  0.9 1.0  ALKPHOS  --  38 37*  ALT  --  11 11  AST  --  17 18  GLUCOSE 146* 65* 88    Imaging/Diagnostic Tests: No new imaging/diagnostic tests  Alcus Dad, MD 07/10/2020, 6:21 AM PGY-1, [place] Intern pager: [phone], text pages welcome

## 2020-07-11 DIAGNOSIS — F200 Paranoid schizophrenia: Secondary | ICD-10-CM | POA: Diagnosis not present

## 2020-07-11 DIAGNOSIS — R4182 Altered mental status, unspecified: Secondary | ICD-10-CM | POA: Diagnosis not present

## 2020-07-11 NOTE — Progress Notes (Signed)
Family Medicine Teaching Service Daily Progress Note Intern Pager: [phone]  Patient name: [name] Medical record number: [id number] Date of birth: 21-Oct-1950 Age: 69 y.o. Gender: male  Primary Care Provider: Danna Hefty, DO Consultants: None Code Status: Full  Pt Overview and Major Events to Date:  11/21: admitted 11/22: medically stable for discharge  Assessment and Plan: [name] a 69 y.o.malewho presented with altered mental status. PMH is significant forschizophrenia, HFpEF,HTN, HLD, BPH, tobacco abuse, GERD, history of hep C s/p Harvoni,andhistory of hep B cleared. Awaiting SNF placement.  Poorly Controlled Schizophrenia  Altered Mental Status-resolved Patient at baseline this morning. He is oriented to person and place. Speech is fairly intelligible compared to prior. -s/p Fluphenazine 25mg  IM on 11/22 (next dose due 12/6) -Continue Cogentin -Trazodone 50mg  and Ramelteon 8mg  nightly -Atarax 25mg  prn at bedtime -1:1 sitter as needed for wandering and agitation -Social work following, appreciate assistance with SNF placement -PT eval and treat  HTN BP stable.  127/78 this morning. Otherwise has remained within normal limits. -Continue home amlodipine 10mg  daily -Monitor BP  BPH: chronic, stable -Continue home Flomax 0.8mg  daily  Hx of Displaced Right Fifth Metatarsal Fractures/p ORIF Ambulating slowly with short stride, but otherwise normal gait. Boot in place. -Continue to monitor -PT eval and treat  Hx of Provoked RLE DVT Recent RLE DVT on prior admission in October 2021, felt to be provoked s/p ORIF. He was started on Xarelto at that time (3 month course). -Continue home Xarelto  GERD Chronic, stable. -Continue home Pepcid 20mg  BID  Tobacco Use -Nicotine patch daily  FEN/GI: Heart healthy diet PPx: Xarelto for hx of DVT   Status is: Inpatient  Remains inpatient appropriate because:Unsafe d/c plan   Dispo: The patient  is from: SNF              Anticipated d/c is to: SNF              Anticipated d/c date is: 1 day              Patient currently is medically stable to d/c.     Subjective:  Patient indicates he is feeling well this morning.  Enjoying his breakfast.  Asking to talk to wife on phone.  Objective: Temp:  [97.6 F (36.4 C)-99.3 F (37.4 C)] 98.5 F (36.9 C) (11/25 0431) Pulse Rate:  [57-84] 57 (11/25 0431) Resp:  [18] 18 (11/25 0431) BP: (106-168)/(66-98) 127/78 (11/25 0431) SpO2:  [98 %-100 %] 100 % (11/25 0431)  Physical Exam:  Physical Exam Constitutional:      General: He is not in acute distress.    Appearance: Normal appearance. He is not ill-appearing.  HENT:     Head: Normocephalic and atraumatic.     Mouth/Throat:     Mouth: Mucous membranes are moist.  Cardiovascular:     Rate and Rhythm: Normal rate and regular rhythm.  Pulmonary:     Effort: Pulmonary effort is normal.     Breath sounds: Normal breath sounds.  Musculoskeletal:     Right lower leg: No edema.     Left lower leg: No edema.     Comments: Right foot in boot  Neurological:     General: No focal deficit present.     Mental Status: He is alert.     Laboratory: Recent Labs  Lab 07/04/20 1728 07/07/20 0224  WBC 5.0 4.6  HGB 10.8* 10.4*  HCT 34.1* 31.7*  PLT 140* 121*  Recent Labs  Lab 07/04/20 1728 07/07/20 0224 07/07/20 0640  NA 141 140 140  K 3.8 3.3* 3.7  CL 107 104 104  CO2 23 27 26   BUN 19 16 16   CREATININE 1.57* 1.22 1.22  CALCIUM 8.8* 8.5* 8.6*  PROT  --  6.0* 6.1*  BILITOT  --  0.9 1.0  ALKPHOS  --  38 37*  ALT  --  11 11  AST  --  17 18  GLUCOSE 146* 65* 88     Imaging/Diagnostic Tests: None  Delora Fuel, MD 07/11/2020, 6:03 AM PGY-1, [place] Intern pager: [phone], text pages welcome

## 2020-07-12 DIAGNOSIS — R4182 Altered mental status, unspecified: Secondary | ICD-10-CM | POA: Diagnosis not present

## 2020-07-12 DIAGNOSIS — F200 Paranoid schizophrenia: Secondary | ICD-10-CM | POA: Diagnosis not present

## 2020-07-12 MED ORDER — POLYETHYLENE GLYCOL 3350 17 G PO PACK
17.0000 g | PACK | Freq: Every day | ORAL | Status: DC
  Administered 2020-07-12 – 2020-07-19 (×7): 17 g via ORAL
  Filled 2020-07-12 (×7): qty 1

## 2020-07-12 NOTE — Progress Notes (Signed)
Physical Therapy Treatment Patient Details Name: [name] MRN: [id number] DOB: 04/07/1951 Today's Date: 07/12/2020    History of Present Illness Pt is a 69 y/o male presenting with AMS. Workup pending. PMH includes schizophrenia, R foot ORIF, tobacco use, HTN and hep C.     PT Comments    Pt tolerated treatment well. Pt demonstrates a tendency to drift toward L side during session, requiring intermittent minA from PT to maintain balance. PT attempts to challenge the pt with dual-tasking activities focusing on cognition. Pt able to read room numbers but unable to follow multi-step commands to track room numbers or to find his own room to return to. Pt will continue to benefit from PT POC to improve mobility quality and restore independence. PT continues to recommend SNF placement as the pt has significantly impaired cognition and remains a safety risk.   Follow Up Recommendations  SNF;Supervision/Assistance - 24 hour (unless ALF can provide 24/7)     Equipment Recommendations  None recommended by PT    Recommendations for Other Services       Precautions / Restrictions Precautions Precautions: Fall Required Braces or Orthoses: Other Brace Other Brace: Had CAM walker boot for RLE  Restrictions Weight Bearing Restrictions: Yes RLE Weight Bearing: Weight bearing as tolerated Other Position/Activity Restrictions: in cam walker boot    Mobility  Bed Mobility               General bed mobility comments: pt received and left sitting at edge of bed  Transfers Overall transfer level: Needs assistance Equipment used: None Transfers: Sit to/from Stand Sit to Stand: Min guard            Ambulation/Gait Ambulation/Gait assistance: Min assist;Min guard Gait Distance (Feet): 150 Feet Assistive device: None Gait Pattern/deviations: Step-to pattern Gait velocity: reduced Gait velocity interpretation: <1.8 ft/sec, indicate of risk for recurrent falls General Gait  Details: pt with short step-to gait, tendency to drift toward L side throughout session   Stairs             Wheelchair Mobility    Modified Rankin (Stroke Patients Only)       Balance Overall balance assessment: Needs assistance Sitting-balance support: No upper extremity supported;Feet supported Sitting balance-Leahy Scale: Good     Standing balance support: No upper extremity supported;During functional activity Standing balance-Leahy Scale: Poor Standing balance comment: reliant on minG-minA from PT                            Cognition Arousal/Alertness: Awake/alert Behavior During Therapy: Impulsive Overall Cognitive Status: No family/caregiver present to determine baseline cognitive functioning Area of Impairment: Attention;Memory;Following commands;Safety/judgement;Awareness;Problem solving                   Current Attention Level: Sustained Memory: Decreased recall of precautions;Decreased short-term memory Following Commands: Follows one step commands with increased time Safety/Judgement: Decreased awareness of safety;Decreased awareness of deficits Awareness: Intellectual Problem Solving: Difficulty sequencing;Requires verbal cues        Exercises      General Comments General comments (skin integrity, edema, etc.): sitter in room, reports pt has been attempting to get up throughout the day      Pertinent Vitals/Pain Pain Assessment: No/denies pain    Home Living                      Prior Function  PT Goals (current goals can now be found in the care plan section) Acute Rehab PT Goals Patient Stated Goal: none Progress towards PT goals: Progressing toward goals    Frequency    Min 2X/week      PT Plan Current plan remains appropriate    Co-evaluation              AM-PAC PT "6 Clicks" Mobility   Outcome Measure  Help needed turning from your back to your side while in a flat bed  without using bedrails?: None Help needed moving from lying on your back to sitting on the side of a flat bed without using bedrails?: None Help needed moving to and from a bed to a chair (including a wheelchair)?: A Little Help needed standing up from a chair using your arms (e.g., wheelchair or bedside chair)?: A Little Help needed to walk in hospital room?: A Little Help needed climbing 3-5 steps with a railing? : A Lot 6 Click Score: 19    End of Session   Activity Tolerance: Patient tolerated treatment well Patient left: in bed;with call bell/phone within reach;with family/visitor present Nurse Communication: Mobility status PT Visit Diagnosis: Unsteadiness on feet (R26.81);Muscle weakness (generalized) (M62.81)     Time: 3833-3832 PT Time Calculation (min) (ACUTE ONLY): 14 min  Charges:  $Gait Training: 8-22 mins                     Zenaida Niece, PT, DPT Acute Rehabilitation Pager: [phone]    Zenaida Niece 07/12/2020, 2:38 PM

## 2020-07-12 NOTE — Progress Notes (Signed)
Family Medicine Teaching Service Daily Progress Note Intern Pager: [phone]  Patient name: HAWKINS SEAMAN Medical record number: [id number] Date of birth: 08-13-1951 Age: 69 y.o. Gender: male  Primary Care Provider: Danna Hefty, DO Consultants: None Code Status: Full  Pt Overview and Major Events to Date:  11/21: admitted 11/22: medically stable for discharge  Assessment and Plan: NESTA KIMPLE a 69 y.o.malewho presentedwith altered mental status. PMH is significant forschizophrenia, HFpEF,HTN, HLD, BPH, tobacco abuse, GERD, history of hep C s/p Harvoni,andhistory of hep B cleared.Awaiting SNF placement   Poorly Controlled Schizophrenia Altered Mental Status-resolved -s/p Fluphenazine 25mg  IM on 11/22 (next dose due 12/6) -Continue Cogentin -Trazodone 50mg  and Ramelteon 8mg  nightly -Atarax 25mg  prn at bedtime -1:1 sitter as needed for wandering and agitation -Social work following, appreciate assistance with SNF placement -PT eval and treat  HTN BP stable.  120/66 this morning. Otherwise has remained within normal limits. -Continue home amlodipine10mg  daily -Monitor BP  BPH: chronic, stable -Continue home Flomax 0.8mg  daily  Hx of Displaced Right Fifth Metatarsal Fractures/p ORIF Ambulatingslowly with short stride, but otherwise normal gait. Boot in place. -Continue to monitor -PT eval and treat  Hx of Provoked RLE DVT Recent RLE DVT on prior admission in October 2021, felt to be provoked s/p ORIF. He was started on Xarelto at that time (3 month course). -Continue home Xarelto  GERD Chronic, stable. -Continue home Pepcid 20mg  BID  Tobacco Use -Nicotine patchdaily   FEN/GI:Heart healthy diet LGX:QJJHERD for hx of DVT   Status is: Inpatient  Remains inpatient appropriate because:Unsafe d/c plan   Dispo: The patient is from: SNF              Anticipated d/c is to: SNF              Anticipated d/c date is: 1 day               Patient currently is medically stable to d/c.      Subjective:  Patient resting comfortably on exam this morning.  Sitter with in room.  Denies any current issues.    Objective: Temp:  [97.4 F (36.3 C)-98.5 F (36.9 C)] 97.4 F (36.3 C) (11/26 0005) Pulse Rate:  [50-83] 50 (11/26 0005) Resp:  [18] 18 (11/26 0005) BP: (110-140)/(74-87) 136/74 (11/26 0005) SpO2:  [98 %-100 %] 100 % (11/26 0005)  Physical Exam:  Constitutional:      General: He is not in acute distress.    Appearance: Normal appearance. He is not ill-appearing.  HENT:     Head: Normocephalic and atraumatic.     Mouth/Throat:     Mouth: Mucous membranes are moist.  Cardiovascular:     Rate and Rhythm: Normal rate and regular rhythm.  Pulmonary:     Effort: Pulmonary effort is normal.     Breath sounds: Normal breath sounds.  Neurological:     General: No focal deficit present.      Laboratory: Recent Labs  Lab 07/07/20 0224  WBC 4.6  HGB 10.4*  HCT 31.7*  PLT 121*   Recent Labs  Lab 07/07/20 0224 07/07/20 0640  NA 140 140  K 3.3* 3.7  CL 104 104  CO2 27 26  BUN 16 16  CREATININE 1.22 1.22  CALCIUM 8.5* 8.6*  PROT 6.0* 6.1*  BILITOT 0.9 1.0  ALKPHOS 38 37*  ALT 11 11  AST 17 18  GLUCOSE 65* 88    Imaging/Diagnostic Tests: No new imaging  Messiyah Waterson,  Arnette Norris, MD 07/12/2020, 2:02 AM PGY-1, [place] Intern pager: [phone], text pages welcome

## 2020-07-13 DIAGNOSIS — F200 Paranoid schizophrenia: Secondary | ICD-10-CM | POA: Diagnosis not present

## 2020-07-13 NOTE — Progress Notes (Signed)
Family Medicine Teaching Service Daily Progress Note Intern Pager: [phone]  Patient name: [name] Medical record number: [id number] Date of birth: 07/20/1951 Age: 69 y.o. Gender: male  Primary Care Provider: Danna Hefty, DO Consultants: None Code Status: Full  Pt Overview and Major Events to Date:  11/21: admitted 11/22: medically stable for discharge  Assessment and Plan: [name] a 69 y.o.malewho presentedwith altered mental status. PMH is significant forschizophrenia, HFpEF,HTN, HLD, BPH, tobacco abuse, GERD, history of hep C s/p Harvoni,andhistory of hep B cleared.Awaiting SNF placement  Poorly Controlled Schizophrenia Altered Mental Status-resolved -s/p Fluphenazine 25mg  IM on 11/22 (next dose due 12/6) -Continue Cogentin -Trazodone 50mg  and Ramelteon 8mg  nightly -Atarax 25mg  prn at bedtime -1:1 sitter as needed for wandering and agitation -Social work following, appreciate assistance with SNF placement -PT eval and treat  HTN BPstable.122/67this morning. Otherwise has remained within normal limits. -Continue home amlodipine10mg  daily -Monitor BP  BPH: chronic, stable -Continue home Flomax 0.8mg  daily  Hx of Displaced Right Fifth Metatarsal Fractures/p ORIF PT seeing.  Pt demonstrates a tendency to drift toward L side during session, requiring intermittent minA from PT to maintain balance. -Continue to monitor -PT eval and treat  Hx of Provoked RLE DVT Recent RLE DVT on prior admission in October 2021, felt to be provoked s/p ORIF. He was started on Xarelto at that time (3 month course). -Continue home Xarelto  GERD Chronic, stable. -Continue home Pepcid 20mg  BID  Tobacco Use -Nicotine patchdaily   FEN/GI:Heart healthy diet OEU:MPNTIRW for hx of DVT   Status is: Inpatient  Remains inpatient appropriate because:Unsafe d/c plan   Dispo: The patient is from: SNF              Anticipated d/c is to: SNF               Anticipated d/c date is: 1 day              Patient currently is medically stable to d/c.        Subjective:  Patient resting comfortably in bed this morning.  Sitter indicates he is not having any issues at this time.    Objective: Temp:  [97.3 F (36.3 C)-99.1 F (37.3 C)] 98.2 F (36.8 C) (11/27 0349) Pulse Rate:  [60-80] 60 (11/27 0349) Resp:  [17-18] 17 (11/27 0349) BP: (122-150)/(67-92) 122/67 (11/27 0349) SpO2:  [99 %-100 %] 99 % (11/27 0349) Physical Exam:  Physical Exam Constitutional:      General: He is not in acute distress.    Appearance: Normal appearance. He is not ill-appearing.  HENT:     Head: Normocephalic and atraumatic.     Mouth/Throat:     Mouth: Mucous membranes are moist.  Cardiovascular:     Rate and Rhythm: Normal rate and regular rhythm.  Pulmonary:     Effort: Pulmonary effort is normal.     Breath sounds: Normal breath sounds.  Skin:    General: Skin is warm.     Laboratory: Recent Labs  Lab 07/07/20 0224  WBC 4.6  HGB 10.4*  HCT 31.7*  PLT 121*   Recent Labs  Lab 07/07/20 0224 07/07/20 0640  NA 140 140  K 3.3* 3.7  CL 104 104  CO2 27 26  BUN 16 16  CREATININE 1.22 1.22  CALCIUM 8.5* 8.6*  PROT 6.0* 6.1*  BILITOT 0.9 1.0  ALKPHOS 38 37*  ALT 11 11  AST 17 18  GLUCOSE 65* 88  Imaging/Diagnostic Tests: No new imaging  Delora Fuel, MD 07/13/2020, 6:23 AM PGY-1, [place] Intern pager: [phone], text pages welcome

## 2020-07-13 NOTE — Discharge Instructions (Signed)
Information on my medicine - XARELTO (rivaroxaban)  This medication education was reviewed with me or my healthcare representative as part of my discharge preparation.  You were prescribed this medication prior to this hospital admission.  WHY WAS XARELTO PRESCRIBED FOR YOU? Xarelto was prescribed to treat blood clots that may have been found in the veins of your legs (deep vein thrombosis) or in your lungs (pulmonary embolism) and to reduce the risk of them occurring again.  What do you need to know about Xarelto? The dose is one 20 mg tablet taken ONCE A DAY with your evening meal.  DO NOT stop taking Xarelto without talking to the health care provider who prescribed the medication.  Refill your prescription for 20 mg tablets before you run out.  After discharge, you should have regular check-up appointments with your healthcare provider that is prescribing your Xarelto.  In the future your dose may need to be changed if your kidney function changes by a significant amount.  What do you do if you miss a dose? If  you miss a dose, take it as soon as you remember on the same day then continue your regularly scheduled once daily regimen the next day. Do not take two doses of Xarelto at the same time.   Important Safety Information Xarelto is a blood thinner medicine that can cause bleeding. You should call your healthcare provider right away if you experience any of the following: ? Bleeding from an injury or your nose that does not stop. ? Unusual colored urine (red or dark brown) or unusual colored stools (red or black). ? Unusual bruising for unknown reasons. ? A serious fall or if you hit your head (even if there is no bleeding).  Some medicines may interact with Xarelto and might increase your risk of bleeding while on Xarelto. To help avoid this, consult your healthcare provider or pharmacist prior to using any new prescription or non-prescription medications, including herbals,  vitamins, non-steroidal anti-inflammatory drugs (NSAIDs) and supplements.  This website has more information on Xarelto: [email or web address]

## 2020-07-14 DIAGNOSIS — R4182 Altered mental status, unspecified: Secondary | ICD-10-CM | POA: Diagnosis not present

## 2020-07-14 NOTE — Progress Notes (Signed)
Family Medicine Teaching Service Daily Progress Note Intern Pager: [phone]  Patient name: [name] Medical record number: [id number] Date of birth: 1951/06/28 Age: 69 y.o. Gender: male  Primary Care Provider: Danna Hefty, DO Consultants: None Code Status: FULL  Pt Overview and Major Events to Date:  11/21: admitted 11/22: medically stable for discharge  Assessment and Plan:  KEMUEL BUCHMANN a 69 y.o.malewho presentedwith altered mental status, which has resolved. PMH is significant forschizophrenia, HFpEF,HTN, HLD, BPH, tobacco abuse, GERD, history of hep C s/p Harvoni,andhistory of hep B cleared.Awaiting SNF placement  Poorly Controlled Schizophrenia  Stable. Patient remains at his baseline. -s/p Fluphenazine 25mg  IM on 11/22 (next dose due 12/6) -Continue Cogentin 1mg  BID -Continue Trazodone 50mg  daily at bedtime and Ramelteon 8mg  daily at bedtime -Atarax 25mg  prn at bedtime -1:1 sitter to prevent wandering -Appreciate social work assistance with SNF placement -Patient supposed to have ACT team appointment 07/16/20 at 10:30am  Hx of Displaced Right Fifth Metatarsal Fracture s/p ORIF Stable, boot remains in place on R ankle/foot. -PT eval and treat  Hx of Provoked RLE DVT Diagnosed on 06/04/2020. Started on Xarelto at that time (plan for 3-6 month course). -Continue Xarelto 20mg  daily   HTN Patient has been normotensive over past 24h, most recent BP 127/78. -Continue Amlodipine 10mg  daily  BPH Chronic, stable. -Continue Flomax 0.8mg  daily  GERD Chronic, stable.  -Continue Pepcid 20mg  BID  Tobacco Use -Nicotine patch 7mg  daily   FEN/GI: Heart healthy diet PPx: on Xarelto for hx of DVT   Status is: Inpatient Remains inpatient appropriate because:Unsafe d/c plan   Dispo: The patient is from: ALF              Anticipated d/c is to: SNF              Anticipated d/c date is: > 3 days              Patient currently is medically stable to  d/c.    Subjective:  No acute events overnight. Patient resting comfortably in bed this morning. Denies complaints.  Objective: Temp:  [98 F (36.7 C)-98.4 F (36.9 C)] 98.4 F (36.9 C) (11/28 0333) Pulse Rate:  [60-76] 60 (11/28 0333) Resp:  [15-20] 19 (11/28 0333) BP: (121-139)/(69-82) 127/78 (11/28 0333) SpO2:  [98 %-100 %] 98 % (11/28 0333) Physical Exam: General: resting comfortably in bed Cardiovascular: RRR, normal S1/S2 without m/r/g Respiratory: normal WOB on room air, lungs CTAB Abdomen: +BS, soft, nontender, nondistended Extremities: no peripheral edema, boot in place on R ankle/foot.   Laboratory: No results for input(s): WBC, HGB, HCT, PLT in the last 168 hours. No results for input(s): NA, K, CL, CO2, BUN, CREATININE, CALCIUM, PROT, BILITOT, ALKPHOS, ALT, AST, GLUCOSE in the last 168 hours.  Invalid input(s): LABALBU   Imaging/Diagnostic Tests: No new imaging/diagnostic tests   Alcus Dad, MD 07/14/2020, 7:16 AM PGY-1, [place] Intern pager: [phone], text pages welcome

## 2020-07-15 ENCOUNTER — Ambulatory Visit (HOSPITAL_COMMUNITY): Payer: Self-pay

## 2020-07-15 DIAGNOSIS — F200 Paranoid schizophrenia: Secondary | ICD-10-CM | POA: Diagnosis not present

## 2020-07-15 NOTE — Progress Notes (Signed)
Family Medicine Teaching Service Daily Progress Note Intern Pager: [phone]  Patient name: [name] Medical record number: [id number] Date of birth: April 29, 1951 Age: 69 y.o. Gender: male  Primary Care Provider: Danna Hefty, DO Consultants: None Code Status: FULL  Pt Overview and Major Events to Date:  11/21: admitted 11/22: medically stable for discharge  Assessment and Plan:  [name] a 69 y.o.malewho presentedwith altered mental status. PMH is significant forschizophrenia, HFpEF,HTN, HLD, BPH, tobacco abuse, GERD, history of hep C s/p Harvoni,andhistory of hep B cleared.Awaiting SNF placement  Poorly Controlled Schizophrenia  Patient was agitated overnight and did not sleep well but was easily redirectable. This morning he is sitting comfortably in his chair without complaints. Remains at his baseline mental status with poorly intelligible speech and oriented to person and place, not time. -s/p Fluphenazine 25mg  IM on 11/22 (next dose due 12/6) -Continue Cogentin 1mg  BID -Continue Trazodone 50mg  daily at bedtime and Ramelteon 8mg  daily at bedtime -Atarax 25mg  prn at bedtime -1:1 sitter -Appreciate social work assistance with SNF placement -Patient supposed to have ACT team appointment tomorrow, 07/16/20 at 10:30am-- will touch base with social work  Hx of Displaced Right Fifth Metatarsal Fracture s/p ORIF Stable, boot remains in place on R ankle/foot. -PT eval and treat  Hx of Provoked RLE DVT Diagnosed on 06/04/2020. Started on Xarelto at that time (plan for 3-6 month course). -Continue Xarelto 20mg  daily   HTN Patient with elevated BP this morning, 162/91. -Continue Amlodipine 10mg  daily -Monitor BP  BPH Chronic, stable. -Continue Flomax 0.8mg  daily  GERD Chronic, stable.  -Continue Pepcid 20mg  BID  Tobacco Use -Nicotine patch 7mg  daily   FEN/GI: Heart healthy diet PPx: on Xarelto for hx of DVT   Status is: Inpatient Remains  inpatient appropriate because:Unsafe d/c plan   Dispo: The patient is from: ALF              Anticipated d/c is to: SNF              Anticipated d/c date is: > 3 days              Patient currently is medically stable to d/c.   Subjective:  Patient has no complaints this morning.  Objective: Temp:  [97.6 F (36.4 C)-98.4 F (36.9 C)] 97.9 F (36.6 C) (11/29 0529) Pulse Rate:  [59-94] 59 (11/29 0529) Resp:  [18-19] 18 (11/29 0529) BP: (113-162)/(70-121) 162/91 (11/29 0529) SpO2:  [90 %-100 %] 98 % (11/29 0529) Physical Exam: General: resting comfortably in bed Cardiovascular: RRR, normal S1/S2 without m/r/g Respiratory: normal WOB on room air, lungs CTAB Abdomen: +BS, soft, nontender, nondistended Extremities: no peripheral edema, boot in place on R ankle/foot. Neuro/Psych: alert and oriented to person and place, not oriented to time. Speech mumbled but unchanged from baseline. No focal neuro deficits.   Laboratory: No results for input(s): WBC, HGB, HCT, PLT in the last 168 hours. No results for input(s): NA, K, CL, CO2, BUN, CREATININE, CALCIUM, PROT, BILITOT, ALKPHOS, ALT, AST, GLUCOSE in the last 168 hours.  Invalid input(s): LABALBU   Imaging/Diagnostic Tests: No new imaging/diagnostic tests   Corey Dad, MD 07/15/2020, 6:33 AM PGY-1, [place] Intern pager: [phone], text pages welcome

## 2020-07-15 NOTE — TOC Progression Note (Addendum)
Transition of Care [facility]) - Progression Note    Patient Details  Name: [name] MRN: [id number] Date of Birth: 16-Sep-1950  [facility] CM/SW [place], LCSW Phone Number: 07/15/2020, 9:25 AM  Clinical Narrative:    9:25am-Patient does not have any bed offers. CSW requested [facility] and [facility] review for a memory care bed.   3:30pm-CSW requested [facility] review patient. CSW contacted [facility] to see about rescheduling his ACT assessment. The person in charge is not there today but will be back tomorrow. CSW will follow up to see if they can assess patient in the hospital.    Expected Discharge Plan: [place] Barriers to Discharge: SNF Pending bed offer  Expected Discharge Plan and Services Expected Discharge Plan: [place]         Expected Discharge Date: 07/08/20                                     Social Determinants of Health (SDOH) Interventions    Readmission Risk Interventions No flowsheet data found.

## 2020-07-15 NOTE — Progress Notes (Signed)
Received Page that Pt is agitated, hyperalert, refusing  to rest and pulling out tele monitor.  Went to see patient, he was lying on the bed, was hypertalkative but was easily redirectable. Told patient to sleep.Turned of lights, turned TV off and Put some sheets on the Pt. Pt is trying to sleep now.  Nurse reported that patient peed in the room and was agitated, making the sitter uncomfortable. Asked the nurse to give hydroxyzine 25 mg to help him sleep.   Dr. Armando Reichert MD PGY1 [place]

## 2020-07-16 DIAGNOSIS — R4182 Altered mental status, unspecified: Secondary | ICD-10-CM | POA: Diagnosis not present

## 2020-07-16 NOTE — Progress Notes (Signed)
Family Medicine Teaching Service Daily Progress Note Intern Pager: [phone]  Patient name: [name] Medical record number: [id number] Date of birth: Dec 02, 1950 Age: 69 y.o. Gender: male  Primary Care Provider: Danna Hefty, DO Consultants: None Code Status: FULL  Pt Overview and Major Events to Date:  11/21: admitted with altered mental status 11/22: medically stable for discharge  Assessment and Plan:  [name] a 69 y.o.malewho presentedwith altered mental status. PMH is significant forschizophrenia, HFpEF,HTN, HLD, BPH, tobacco abuse, GERD, history of hep C s/p Harvoni,andhistory of hep B cleared.Medically stable for discharge to SNF when bed available.   Poorly Controlled Schizophrenia Patient remains at his baseline this morning. He is pleasant but has mumbled speech and is oriented to place only.  -s/p Fluphenazine 25mg  IM on 11/22 (next dose due 12/6) -Continue Cogentin 1mg  BID -Continue Trazodone 50mg  daily at bedtime and Ramelteon 8mg  daily at bedtime -Atarax 25mg  prn at bedtime -1:1 sitter -Appreciate social work assistance with SNF placement -Patient supposed to have ACT team appointment today, 07/16/20 at 10:30am-- social work inquiring whether ACT team will see patient in the hospital   Hx of Displaced Right Fifth Metatarsal Fracture s/p ORIF Stable, boot remains in place on R ankle/foot. -Continue PT/OT   Hx of Provoked RLE DVT Diagnosed on 06/04/2020. Started on Xarelto at that time (plan for 3-6 month course). -Continue Xarelto 20mg  daily    HTN Chronic, stable. BP has remained wnl over past 24h, most recent reading 115/86. -Continue Amlodipine 10mg  daily -Monitor BP   BPH Chronic, stable. -Continue Flomax 0.8mg  daily   GERD Chronic, stable.  -Continue Pepcid 20mg  BID   Tobacco Use -Nicotine patch 7mg  daily   FEN/GI: heart healthy diet PPx: Xarelto (for hx of DVT)   Status is: Inpatient Remains inpatient appropriate  because:Unsafe d/c plan   Dispo: The patient is from: ALF              Anticipated d/c is to: SNF              Anticipated d/c date is: 3 days              Patient currently is medically stable to d/c.    Subjective:  No acute events overnight. Patient denies complaints this morning. He is smiling throughout my exam and eating breakfast.  Objective: Temp:  [97.6 F (36.4 C)-98 F (36.7 C)] 97.9 F (36.6 C) (11/30 0910) Pulse Rate:  [69-81] 69 (11/30 0910) Resp:  [18] 18 (11/30 0910) BP: (102-140)/(76-86) 115/86 (11/30 1110) SpO2:  [98 %-100 %] 100 % (11/30 0910) Physical Exam: General: alert, no acute distress Cardiovascular: RRR, normal S1/S2 without m/r/g Respiratory: normal WOB on room air, lungs CTAB Abdomen: soft, nontender, nondistended Extremities: moving all extremities equally, no peripheral edema Neuro: alert and oriented to self and place. Not oriented to time.  Laboratory: No results for input(s): WBC, HGB, HCT, PLT in the last 168 hours. No results for input(s): NA, K, CL, CO2, BUN, CREATININE, CALCIUM, PROT, BILITOT, ALKPHOS, ALT, AST, GLUCOSE in the last 168 hours.  Invalid input(s): LABALBU   Imaging/Diagnostic Tests: No new imaging/diagnostic tests   Corey Dad, MD 07/16/2020, 12:06 PM PGY-1, [place] Intern pager: [phone], text pages welcome

## 2020-07-16 NOTE — Discharge Summary (Addendum)
[facility] Discharge Summary  Patient name: [name] Medical record number: [id number] Date of birth: 07-13-1951 Age: 69 y.o. Gender: male Date of Admission: 07/06/2020  Date of Discharge: 07/19/20 Admitting Physician: Gifford Shave, MD  Primary Care Provider: Danna Hefty, DO Consultants: Ortho  Indication for Hospitalization: Altered Mental Status  Discharge Diagnoses/Problem List:  Schizophrenia HTN Hx of displaced right fifth metatarsal fracture s/p ORIF GERD HFpEF HLD BPH Tobacco use disorder  PAD s/p L BKA & R toe  Disposition: [place]  Discharge Condition: Stable  Discharge Exam:  General: NAD, eating banana in bed. Speech is mumbled and difficult to comprehend. Cardiovascular: RRR, no murmur appreciated Respiratory: CTAB, no wheezing/rhonchi/rales Abdomen: soft, non-tender, normoactive bowel sounds  Brief Hospital Course:  [name] is a 69 y.o. male who presented with altered mental status, now at baseline. PMH is significant for primary schizophrenia, HFpEF, HTN, HLD, BPH, tobacco abuse, GERD, history of hep C s/p Harvoni, history of hep B cleared.   Altered mental status  Schizophrenia Patient initially presented to ED with complaints of abdominal pain. Staff noticed he was acting abnormally, demonstrated by trying to urinate in a trashcan as well as decreased comprehension of his speech. Patient also seemed unsteady on his feet. Patient was extensively evaluated for etiologies of AMS including CBC, CMP, alcohol levels, and head CT which were all unremarkable. Patient's altered mental status thought to be due to underlying schizophrenia compounded by nonadherence to antipsychotic medication regimen. He was given his Fluphenazine injection on 11/22 while in the hospital because he missed his dose on 11/19. Next dose of Prolixin due 12/6. On 11/22 patient was at his baseline and ready for  discharge back to [facility]. However, [facility] informed [name] they would not be accepting patient back to their facility, so he remained in the hospital until SNF bed obtained at [facility].   HTN Patient initially hypertensive in the ED with BP up to 170/112, likely due to medication noncompliance. The following day he was normotensive after receiving his home amlodipine. Remained stable throughout remainder of admission.   Hx of Displaced Right Fifth Metatarsal Fracture s/p ORIF  Patient with boot on R leg. Unfortunately missed follow up ortho appointment on 11/18. Discussed case with ortho on 12/1 who saw patient and stated that fracture boot could be removed. No additional imaging ordered. Weightbearing PT ordered. Patient was able to bear weight without pain or difficulty.   The remainder of patient's chronic medical problems (BPH, CKD, GERD, anemia, Hx of DVT) were stable throughout his admission and he was maintained on his home medications.  Issues for Follow Up:  1. Due for next dose of Prolixin on 12/6 2. ACT team to see patient outpatient 3. Repeat CBC next week for thrombocytopenia and anemia, if worsening consider follow up with PCP 4. End date of Xarelto 12/15  Significant Procedures: None  Significant Labs and Imaging:  No results for input(s): WBC, HGB, HCT, PLT in the last 168 hours. No results for input(s): NA, K, CL, CO2, GLUCOSE, BUN, CREATININE, CALCIUM, MG, PHOS, ALKPHOS, AST, ALT, ALBUMIN, PROTEIN in the last 168 hours.  Invalid input(s): TBILI  Results/Tests Pending at Time of Discharge: None  Discharge Medications:  Allergies as of 07/19/2020       Reactions   Lisinopril Other (See Comments)   Cough   Penicillins Hives   Amoxicillin Rash   Ibuprofen Rash  Medication List     TAKE these medications    acetaminophen 325 MG tablet Commonly known as: TYLENOL Take 2 tablets (650 mg total) by mouth every 6 (six) hours as needed for  mild pain, moderate pain or headache.   amLODipine 10 MG tablet Commonly known as: NORVASC Take 1 tablet (10 mg total) by mouth daily.   benztropine 1 MG tablet Commonly known as: COGENTIN Take 1 tablet (1 mg total) by mouth 2 (two) times daily.   cetirizine 10 MG tablet Commonly known as: ZyrTEC Allergy Take 1 tablet (10 mg total) by mouth daily.   famotidine 20 MG tablet Commonly known as: PEPCID Take 1 tablet (20 mg total) by mouth 2 (two) times daily.   fluPHENAZine decanoate 25 MG/ML injection Commonly known as: PROLIXIN Inject 1 mL (25 mg total) into the muscle every 14 (fourteen) days. Last dose 07/08/2020. Next dose due 07/22/2020. What changed: additional instructions   folic acid 1 MG tablet Commonly known as: FOLVITE Take 1 tablet (1 mg total) by mouth daily.   hydrOXYzine 25 MG tablet Commonly known as: ATARAX/VISTARIL Take 1 tablet (25 mg total) by mouth at bedtime as needed for anxiety (Anxiety, Insomnia).   multivitamin with minerals Tabs tablet Take 1 tablet by mouth daily.   nicotine 14 mg/24hr patch Commonly known as: NICODERM CQ - dosed in mg/24 hours Place 14 mg onto the skin daily.   ramelteon 8 MG tablet Commonly known as: ROZEREM Take 1 tablet (8 mg total) by mouth at bedtime.   rivaroxaban 20 MG Tabs tablet Commonly known as: XARELTO Take 1 tablet (20 mg total) by mouth daily with supper.   tamsulosin 0.4 MG Caps capsule Commonly known as: FLOMAX TAKE 2 CAPSULE BY MOUTH DAILY What changed: See the new instructions.   thiamine 100 MG tablet Take 1 tablet (100 mg total) by mouth daily.   traZODone 50 MG tablet Commonly known as: DESYREL Take 1 tablet (50 mg total) by mouth at bedtime.   vitamin C 500 MG tablet Commonly known as: ASCORBIC ACID Take 500 mg by mouth daily.        Discharge Instructions: Please refer to Patient Instructions section of EMR for full details.  Patient was counseled important signs and symptoms that  should prompt return to medical care, changes in medications, dietary instructions, activity restrictions, and follow up appointments.   Follow-Up Appointments:  Contact information for follow-up providers     [facility], [facility]. Go on 07/16/2020.   Why: You have an ACT Team assessment on Tuesday 07/16/20 at 1030am. This appiontment is in person at [facility] office.  Contact information: [address] [place] [address] [phone]              Contact information for after-discharge care     [facility] .   Service: Skilled Nursing Contact information: [place]                     Lattie Haw, MD 07/19/2020, 2:20 PM PGY-2, [place]

## 2020-07-16 NOTE — Care Management Important Message (Signed)
Important Message  Patient Details  Name: [name] MRN: [id number] Date of Birth: May 12, 1951   Medicare Important Message Given:  Yes     Orbie Pyo 07/16/2020, 2:48 PM

## 2020-07-16 NOTE — Progress Notes (Signed)
Physical Therapy Treatment Patient Details Name: [name] MRN: [id number] DOB: May 02, 1951 Today's Date: 07/16/2020    History of Present Illness Pt is a 69 y/o male presenting with AMS. Admitted with Poorly Controlled Schizophrenia. PMH includes schizophrenia, R foot ORIF, tobacco use, HTN and hep C.    PT Comments    Patient progressing slowly towards PT goals. Pt pleasant today during session. Continues to be impulsive. Tolerated gait training with Min A for balance/safety due to unsteadiness and LOB on a few occasions needing support. Worked on dual tasking and path finding in hallway. Pt with difficulty multi tasking resulting in impaired balance and poor memory when trying to find rooms in hallway. Continues to be a high fall risk. Encouraged pt wearing a shoe on other foot to prevent hip/back pain due to leg length discrepancy from wearing the CAM boot. Will follow.   Follow Up Recommendations  SNF;Supervision/Assistance - 24 hour (unless ALF can provide 24/7)     Equipment Recommendations  None recommended by PT    Recommendations for Other Services       Precautions / Restrictions Precautions Precautions: Fall Required Braces or Orthoses: Other Brace Other Brace: Had CAM walker boot for RLE  Restrictions Weight Bearing Restrictions: Yes RLE Weight Bearing: Weight bearing as tolerated Other Position/Activity Restrictions: in cam walker boot    Mobility  Bed Mobility               General bed mobility comments: pt received and left sitting at edge of bed  Transfers Overall transfer level: Needs assistance Equipment used: None Transfers: Sit to/from Stand Sit to Stand: Min assist         General transfer comment: Min A for steadying assist upon standing due to posterior lean.  Ambulation/Gait Ambulation/Gait assistance: Min assist Gait Distance (Feet): 150 Feet Assistive device: None Gait Pattern/deviations: Step-to pattern;Staggering right Gait  velocity: reduced   General Gait Details: pt with short step-to gait, tendency to drift toward right side throughout session, Min A needed for balance.   Stairs             Wheelchair Mobility    Modified Rankin (Stroke Patients Only)       Balance Overall balance assessment: Needs assistance Sitting-balance support: Feet supported;No upper extremity supported Sitting balance-Leahy Scale: Good Sitting balance - Comments: Able to reach down to adjust CAM boot sitting EOB without LOB.   Standing balance support: During functional activity Standing balance-Leahy Scale: Poor Standing balance comment: reliant on minG-minA due to unsteadiness.                            Cognition Arousal/Alertness: Awake/alert Behavior During Therapy: Impulsive Overall Cognitive Status: No family/caregiver present to determine baseline cognitive functioning Area of Impairment: Attention;Memory;Following commands;Safety/judgement;Awareness;Problem solving                   Current Attention Level: Sustained   Following Commands: Follows one step commands with increased time Safety/Judgement: Decreased awareness of safety;Decreased awareness of deficits Awareness: Intellectual Problem Solving: Difficulty sequencing;Requires verbal cues General Comments: Easily distracted, impulsive. Redirectable      Exercises      General Comments General comments (skin integrity, edema, etc.): Sitter in room      Pertinent Vitals/Pain Pain Assessment: Faces Faces Pain Scale: No hurt    Home Living  Prior Function            PT Goals (current goals can now be found in the care plan section) Progress towards PT goals: Progressing toward goals    Frequency    Min 2X/week      PT Plan Current plan remains appropriate    Co-evaluation              AM-PAC PT "6 Clicks" Mobility   Outcome Measure  Help needed turning from your  back to your side while in a flat bed without using bedrails?: None Help needed moving from lying on your back to sitting on the side of a flat bed without using bedrails?: None Help needed moving to and from a bed to a chair (including a wheelchair)?: A Little Help needed standing up from a chair using your arms (e.g., wheelchair or bedside chair)?: A Little Help needed to walk in hospital room?: A Little Help needed climbing 3-5 steps with a railing? : A Lot 6 Click Score: 19    End of Session Equipment Utilized During Treatment: Gait belt Activity Tolerance: Patient tolerated treatment well Patient left: in bed;with call bell/phone within reach;with nursing/sitter in room Nurse Communication: Mobility status PT Visit Diagnosis: Unsteadiness on feet (R26.81);Muscle weakness (generalized) (M62.81)     Time: 1517-6160 PT Time Calculation (min) (ACUTE ONLY): 13 min  Charges:  $Gait Training: 8-22 mins                     Marisa Severin, PT, DPT Acute Rehabilitation Services Pager [phone] Office [place] 07/16/2020, 3:04 PM

## 2020-07-17 ENCOUNTER — Inpatient Hospital Stay (HOSPITAL_COMMUNITY): Payer: Medicare Other

## 2020-07-17 DIAGNOSIS — R4182 Altered mental status, unspecified: Secondary | ICD-10-CM | POA: Diagnosis not present

## 2020-07-17 DIAGNOSIS — S92901A Unspecified fracture of right foot, initial encounter for closed fracture: Secondary | ICD-10-CM

## 2020-07-17 DIAGNOSIS — S92901S Unspecified fracture of right foot, sequela: Secondary | ICD-10-CM

## 2020-07-17 DIAGNOSIS — F200 Paranoid schizophrenia: Secondary | ICD-10-CM | POA: Diagnosis not present

## 2020-07-17 NOTE — TOC Progression Note (Signed)
Transition of Care [facility]) - Progression Note    Patient Details  Name: DAUD CAYER MRN: [id number] Date of Birth: 25-Jun-1951  Transition of Care [facility]) CM/SW [place], [place] Phone Number: 07/17/2020, 3:14 PM  Clinical Narrative:    [facility] will come assess patient in person either today or tomorrow for their locked unit. Patient is COVID vaccinated.     Expected Discharge Plan: [place] Barriers to Discharge: SNF Pending bed offer  Expected Discharge Plan and Services Expected Discharge Plan: [place]         Expected Discharge Date: 07/08/20                                     Social Determinants of Health (SDOH) Interventions    Readmission Risk Interventions No flowsheet data found.

## 2020-07-17 NOTE — Progress Notes (Signed)
Family Medicine Teaching Service Daily Progress Note Intern Pager: [phone]  Patient name: [name] Medical record number: [id number] Date of birth: 05-24-1951 Age: 69 y.o. Gender: male  Primary Care Provider: Danna Hefty, DO Consultants: None  Code Status: FULL  Pt Overview and Major Events to Date:  11/21: admitted with AMS 11/22: Stable for SNF placement   Assessment and Plan: [name] a 69 y.o.malewho presentedwith altered mental status. PMH is significant forschizophrenia, HFpEF,HTN, HLD, BPH, tobacco abuse, GERD, history of hep C s/p Harvoni,andhistory of hep B cleared.Medically stable for discharge to SNF when bed available.   Poorly Controlled Schizophrenia Patient alert. Confused but appears to be at baseline. Communicative and not agitated.  - Next dose of Fluphenazine 25mg  IM due on 12/6 - Continue Cogentin 1mg  BID - Continue Trazodone 50mg  daily at bedtime and Ramelteon 8mg  daily at bedtime - Atarax 25mg  PRN at bedtime for agitation - 1:1 sitter - Appreciate social work assistance with SNF placement - ACT team to see patient outpatient  Hx of Displaced Right Fifth Metatarsal Fracture s/p ORIF Stable, boot remains in place on R ankle/foot. Was able to walk a couple steps to take a shower this AM.  - Discuss with ortho for recommendations on boot and any follow up x-rays - Continue PT/OT  Hx of Provoked RLE DVT Diagnosed in September 2021. Started on Xarelto at that time (plan for 3-6 month course). - Continue Xarelto 20mg  daily until 12/15  HTN: chronic, stable Stable over last 24 hours. - Continue Amlodipine 10mg  daily - Vitals per floor protocol   BPH: chronic, stable -Continue Flomax 0.8mg  daily  GERD: chronic, stable -Continue Pepcid 20mg  BID  Tobacco Use -Nicotine patch 7mg  daily  FEN/GI: Heart healthy PPx: Xarelto   Status is: Inpatient  Remains inpatient appropriate because:Unsafe d/c plan   Dispo: The patient  is from: ALF              Anticipated d/c is to: SNF              Anticipated d/c date is: 3 days              Patient currently is medically stable to d/c. Awaiting SNF placement    Subjective:  Patient is pleasant and communicative this morning. Able to tell me that it is fall but is confused on the year and does not know who the president is. He states that he feels well and has no complaints. Showered and shaved this morning with the help of the sitter.   Objective: Temp:  [97.8 F (36.6 C)-98.2 F (36.8 C)] 98.2 F (36.8 C) (12/01 0310) Pulse Rate:  [65-79] 66 (12/01 0310) Resp:  [18] 18 (12/01 0310) BP: (102-129)/(75-86) 122/75 (12/01 0310) SpO2:  [91 %-100 %] 99 % (12/01 0310) Physical Exam: General: Alert, oriented to season, not place or situation.  HEENT: poor dentition Cardiovascular: RRR, no murmurs Respiratory: CTAB, no respiratory distress Abdomen: soft, non-tender, normoactive bowel sounds Extremities: RLE in boot  Laboratory: No results for input(s): WBC, HGB, HCT, PLT in the last 168 hours. No results for input(s): NA, K, CL, CO2, BUN, CREATININE, CALCIUM, PROT, BILITOT, ALKPHOS, ALT, AST, GLUCOSE in the last 168 hours.  Invalid input(s): LABALBU  Imaging/Diagnostic Tests: None  Sharion Settler, DO 07/17/2020, 7:02 AM PGY-1, [place] Intern pager: [phone], text pages welcome

## 2020-07-17 NOTE — Consult Note (Signed)
ORTHOPAEDIC CONSULTATION  REQUESTING PHYSICIAN: Lenoria Chime, MD  Chief Complaint: Right foot status post open reduction internal fixation for a transverse Lisfranc fracture dislocation status post open reduction internal fixation  HPI: [name] is a 69 y.o. male who presents with confusion and mental status changes concern for ability to weight-bear on the right foot status post internal fixation.  Past Medical History:  Diagnosis Date  . Acute encephalopathy 04/23/2020  . Acute metabolic encephalopathy 08/22/1759  . AKI (acute kidney injury) ([place]) 05/12/2020  . BPH (benign prostatic hyperplasia)   . Closed displaced fracture of fifth metatarsal bone of right foot   . Colon polyp 2010  . Crush injury   . Depression   . GERD (gastroesophageal reflux disease)   . Hepatitis C    "caught it when I had a blood transfusion"  . High cholesterol   . History of blood transfusion    "when I was young"  . Hypertension   . Paranoid schizophrenia ([place])   . PARANOID SCHIZOPHRENIA, CHRONIC 01/17/2009  . Prostate atrophy   . Retinal vein occlusion    Past Surgical History:  Procedure Laterality Date  . CIRCUMCISION  1959  . ORIF TOE FRACTURE Right 04/30/2020   Procedure: OPEN REDUCTION INTERNAL FIXATION (ORIF) RIGHT FOOT METATARSAL FRACTURES;  Surgeon: Newt Minion, MD;  Location: [place];  Service: Orthopedics;  Laterality: Right;  . TONSILLECTOMY     Social History   Socioeconomic History  . Marital status: Married    Spouse name: Not on file  . Number of children: Not on file  . Years of education: Not on file  . Highest education level: Not on file  Occupational History  . Not on file  Tobacco Use  . Smoking status: Current Every Day Smoker    Packs/day: 0.50    Years: 48.00    Pack years: 24.00    Types: Cigarettes  . Smokeless tobacco: Never Used  Substance and Sexual Activity  . Alcohol use: Yes  . Drug use: No  . Sexual activity: Yes  Other Topics Concern    . Not on file  Social History Narrative  . Not on file   Social Determinants of Health   Financial Resource Strain:   . Difficulty of Paying Living Expenses: Not on file  Food Insecurity:   . Worried About Charity fundraiser in the Last Year: Not on file  . Ran Out of Food in the Last Year: Not on file  Transportation Needs:   . Lack of Transportation (Medical): Not on file  . Lack of Transportation (Non-Medical): Not on file  Physical Activity:   . Days of Exercise per Week: Not on file  . Minutes of Exercise per Session: Not on file  Stress:   . Feeling of Stress : Not on file  Social Connections:   . Frequency of Communication with Friends and Family: Not on file  . Frequency of Social Gatherings with Friends and Family: Not on file  . Attends Religious Services: Not on file  . Active Member of Clubs or Organizations: Not on file  . Attends Archivist Meetings: Not on file  . Marital Status: Not on file   Family History  Problem Relation Age of Onset  . Hypertension Mother   . Diabetes Mother   . Stroke Mother    - negative except otherwise stated in the family history section Allergies  Allergen Reactions  . Lisinopril Other (See  Comments)    Cough  . Penicillins Hives  . Amoxicillin Rash  . Ibuprofen Rash   Prior to Admission medications   Medication Sig Start Date End Date Taking? Authorizing Provider  acetaminophen (TYLENOL) 325 MG tablet Take 2 tablets (650 mg total) by mouth every 6 (six) hours as needed for mild pain, moderate pain or headache. 05/09/20  Yes Mullis, Kiersten P, DO  benztropine (COGENTIN) 1 MG tablet Take 1 tablet (1 mg total) by mouth 2 (two) times daily. 02/26/20  Yes Eulis Canner E, NP  cetirizine (ZYRTEC ALLERGY) 10 MG tablet Take 1 tablet (10 mg total) by mouth daily. 02/01/20  Yes [name], [name], PA-C  folic acid (FOLVITE) 1 MG tablet Take 1 tablet (1 mg total) by mouth daily. 05/09/20  Yes Mullis, Kiersten P, DO  Multiple  Vitamin (MULTIVITAMIN WITH MINERALS) TABS tablet Take 1 tablet by mouth daily. 06/28/14  Yes Bacigalupo, Dionne Bucy, MD  nicotine (NICODERM CQ - DOSED IN MG/24 HOURS) 14 mg/24hr patch Place 14 mg onto the skin daily.   Yes [provider]  tamsulosin (FLOMAX) 0.4 MG CAPS capsule TAKE 2 CAPSULE BY MOUTH DAILY Patient taking differently: Take 0.8 mg by mouth daily.  02/23/20  Yes Mullis, Kiersten P, DO  thiamine 100 MG tablet Take 1 tablet (100 mg total) by mouth daily. 05/09/20  Yes Mullis, Kiersten P, DO  vitamin C (ASCORBIC ACID) 500 MG tablet Take 500 mg by mouth daily.    Yes [provider]  amLODipine (NORVASC) 10 MG tablet Take 1 tablet (10 mg total) by mouth daily. 07/08/20   Alcus Dad, MD  famotidine (PEPCID) 20 MG tablet Take 1 tablet (20 mg total) by mouth 2 (two) times daily. 07/08/20   Alcus Dad, MD  fluPHENAZine decanoate (PROLIXIN) 25 MG/ML injection Inject 1 mL (25 mg total) into the muscle every 14 (fourteen) days. Last dose 07/08/2020. Next dose due 07/22/2020. 07/08/20   Alcus Dad, MD  hydrOXYzine (ATARAX/VISTARIL) 25 MG tablet Take 1 tablet (25 mg total) by mouth at bedtime as needed for anxiety (Anxiety, Insomnia). Patient not taking: Reported on 07/07/2020 06/26/20   Wilber Oliphant, MD  ramelteon (ROZEREM) 8 MG tablet Take 1 tablet (8 mg total) by mouth at bedtime. Patient not taking: Reported on 07/07/2020 06/26/20   Wilber Oliphant, MD  rivaroxaban (XARELTO) 20 MG TABS tablet Take 1 tablet (20 mg total) by mouth daily with supper. Patient not taking: Reported on 07/07/2020 06/26/20   Wilber Oliphant, MD  traZODone (DESYREL) 50 MG tablet Take 1 tablet (50 mg total) by mouth at bedtime. Patient not taking: Reported on 07/07/2020 06/26/20   Wilber Oliphant, MD  loratadine (CLARITIN) 10 MG tablet Take 1 tablet (10 mg total) by mouth daily. 12/28/19 02/05/20  [name], DO   DG Foot Complete Right  Result Date: 07/17/2020 CLINICAL DATA:  History of foot  fracture status post multiple fusions EXAM: RIGHT FOOT COMPLETE - 3+ VIEW COMPARISON:  06/26/2020 FINDINGS: Frontal, oblique, lateral views of the right foot are obtained. Cannulated screw traverses the fifth metatarsal, stable. There are 2 screws traversing the medial cuneiform. The medial screw which is oriented posteriorly traverses the first tarsometatarsal joint, and is unchanged. The second screw, traversing the medial cuneiform and second metatarsal base demonstrates subtle increased lucency suggesting some motion at that location. However, alignment is stable. There are healed second through fifth metatarsal fractures. No new bony abnormalities. The bones are diffusely osteopenic. IMPRESSION: 1. Healed second  through fifth metatarsal fractures as above. 2. Stable position of the orthopedic hardware. Slight lucency surrounding the screw at the base of the second metatarsal suggests some motion at that location, but alignment remains unchanged. Electronically Signed   By: Randa Ngo M.D.   On: 07/17/2020 15:47   - pertinent xrays, CT, MRI studies were reviewed and independently interpreted  Positive ROS: All other systems have been reviewed and were otherwise negative with the exception of those mentioned in the HPI and as above.  Physical Exam: General: Alert, no acute distress Psychiatric: Patient is competent for consent with normal mood and affect Lymphatic: No axillary or cervical lymphadenopathy Cardiovascular: No pedal edema Respiratory: No cyanosis, no use of accessory musculature GI: No organomegaly, abdomen is soft and non-tender    Images:  @ENCIMAGES @  Labs:  Lab Results  Component Value Date   HGBA1C 5.4 08/21/2016   HGBA1C 5.3 05/15/2015   HGBA1C 5.4 05/28/2014   ESRSEDRATE 3 04/24/2020   CRP 7.3 (H) 04/24/2020   REPTSTATUS 05/13/2020 FINAL 05/12/2020   CULT MULTIPLE SPECIES PRESENT, SUGGEST RECOLLECTION (A) 05/12/2020   LABORGA STAPHYLOCOCCUS SPECIES (COAGULASE  NEGATIVE) 07/27/2014    Lab Results  Component Value Date   ALBUMIN 3.6 07/07/2020   ALBUMIN 3.5 07/07/2020   ALBUMIN 3.6 06/06/2020    Neurologic: Patient does not have protective sensation bilateral lower extremities.   MUSCULOSKELETAL:   Skin: Examination the skin is intact there is no swelling he has a good pulse.  His foot is plantigrade with good ankle motion.  Review of the radiographs obtained today compared to last month shows stable internal fixation with good bony union.  Assessment: Stable internal fixation for Lisfranc fracture dislocation transverse.  Plan: Patient may discontinue the fracture boot at this time I will write orders for physical therapy weightbearing as tolerated both lower extremities.  Thank you for the consult and the opportunity to see Mr. Desiree Lucy, [place] [phone] 3:52 PM

## 2020-07-17 NOTE — Progress Notes (Signed)
Spoke with social worker Camera operator.  [place] will be coming to see patient today or tomorrow to make a decision.  Patient remains medically cleared for d/c.  Appreciate SW help with placement for this very pleasant patient.  [place] Constable, D.O.  PGY-3 Family Medicine  07/17/2020 2:46 PM

## 2020-07-18 ENCOUNTER — Ambulatory Visit (HOSPITAL_COMMUNITY): Payer: Medicare Other

## 2020-07-18 DIAGNOSIS — Z7901 Long term (current) use of anticoagulants: Secondary | ICD-10-CM

## 2020-07-18 DIAGNOSIS — Z86718 Personal history of other venous thrombosis and embolism: Secondary | ICD-10-CM

## 2020-07-18 DIAGNOSIS — F200 Paranoid schizophrenia: Secondary | ICD-10-CM | POA: Diagnosis not present

## 2020-07-18 DIAGNOSIS — R4182 Altered mental status, unspecified: Secondary | ICD-10-CM | POA: Diagnosis not present

## 2020-07-18 LAB — SARS CORONAVIRUS 2 BY RT PCR (HOSPITAL ORDER, PERFORMED IN ~~LOC~~ HOSPITAL LAB): SARS Coronavirus 2: NEGATIVE

## 2020-07-18 NOTE — Progress Notes (Signed)
Family Medicine Teaching Service Daily Progress Note Intern Pager: [phone]  Patient name: [name] Medical record number: [id number] Date of birth: 09/10/1950 Age: 69 y.o. Gender: male  Primary Care Provider: Danna Hefty, DO Consultants: Ortho Code Status: FULL  Pt Overview and Major Events to Date:  11/21: admitted with AMS 11/22: Stable for SNF placement   Assessment and Plan: [name] a 69 y.o.malewho presentedwith altered mental status. PMH is significant forschizophrenia, HFpEF,HTN, HLD, BPH, tobacco abuse, GERD, history of hep C s/p [place],andhistory of hep B cleared.Medically stable for discharge to SNF when bed available.   Schizophrenia: chronic, stable [facility] come and assess patient for their locked unit.  - Next dose of Fluphenazine 25mg  IM due on 12/6 - Continue Cogentin 1mg  BID - Continue Trazodone 50mg  daily at bedtime and Ramelteon 8mg  daily at bedtime - Atarax 25mg  PRN at bedtime for agitation - 1:1 sitter - Appreciate social work assistance with placement - ACT team to see patient outpatient  Hx of Displaced Right Fifth Metatarsal Fracture s/p ORIF: stable  Fracture boot removed per orthopedic recommendations.No edema or skin breakdown noted on foot. Patient able to bear weight when walking to the bathroom without difficulty. - PT weightbearing as tolerated b/l lower extremities per ortho recommendations  Hx of Provoked RLE DVT: stable Diagnosed in September 2021. Started on Xarelto at that time. - Continue Xarelto 20mg  daily until 12/15  HTN: chronic, stable Stable over last 24 hours. - Continue Amlodipine 10mg  daily - Vitals per floor protocol   BPH: chronic, stable -Continue Flomax 0.8mg  daily  GERD: chronic, stable -Continue Pepcid 20mg  BID  Tobacco Use -Nicotine patch 7mg  daily  FEN/GI: Heart healthy PPx: Xarelto   Status is: Inpatient  Remains inpatient appropriate because:Unsafe d/c  plan   Dispo: The patient is from: ALF              Anticipated d/c is to: SNF              Anticipated d/c date is: 1 day              Patient currently is medically stable to d/c.   Subjective:  Patient with no complaints this morning. He is wondering if he will be able to go home today. He is happy to have the boot off of his foot.  Objective: Temp:  [97.9 F (36.6 C)-98.2 F (36.8 C)] 98.2 F (36.8 C) (12/02 0331) Pulse Rate:  [54-62] 54 (12/02 0331) Resp:  [17-18] 17 (12/02 0331) BP: (110-125)/(61-80) 120/61 (12/02 0331) SpO2:  [97 %-100 %] 97 % (12/02 0331) Physical Exam: General: Pleasant, in no acute distress Cardiovascular: RRR, no murmurs appreciated Respiratory: CTAB Abdomen: soft, non-tender in all quadrants, normoactive bowel sounds Extremities: No edema  Laboratory: No results for input(s): WBC, HGB, HCT, PLT in the last 168 hours. No results for input(s): NA, K, CL, CO2, BUN, CREATININE, CALCIUM, PROT, BILITOT, ALKPHOS, ALT, AST, GLUCOSE in the last 168 hours.  Invalid input(s): LABALBU  Imaging/Diagnostic Tests: None new.  Sharion Settler, DO 07/18/2020, 6:47 AM PGY-1, [place] Intern pager: [phone], text pages welcome

## 2020-07-18 NOTE — Progress Notes (Signed)
Physical Therapy Treatment Patient Details Name: [name] MRN: [id number] DOB: 1951/05/01 Today's Date: 07/18/2020    History of Present Illness Pt is a 69 y/o male presenting with AMS. Admitted with Poorly Controlled Schizophrenia. PMH includes schizophrenia, R foot ORIF, tobacco use, HTN and hep C.    PT Comments    Pt tolerates treatment well. Pt now WBAT with no bracing needs for RLE and demonstrates some improvement in static gait. When distracted pt tends to demonstrate increased gait deviations and experiences one loss of balance during this session. Pt demonstrates limited awareness of his current deficits and remains at an increased risk of falling despite improvement in gait quality. Pt will continue to benefit from acute PT POC to improve dynamic gait and balance. PT continues to recommend SNF placement at this time.  Follow Up Recommendations  SNF;Supervision/Assistance - 24 hour     Equipment Recommendations  None recommended by PT    Recommendations for Other Services       Precautions / Restrictions Precautions Precautions: Fall Precaution Comments: now WBAT, no need for CAM per ortho on 07/17/2020 Restrictions Weight Bearing Restrictions: Yes RLE Weight Bearing: Weight bearing as tolerated    Mobility  Bed Mobility                  Transfers Overall transfer level: Needs assistance Equipment used: None Transfers: Sit to/from Stand Sit to Stand: Supervision            Ambulation/Gait Ambulation/Gait assistance: Min guard;Supervision Gait Distance (Feet): 300 Feet Assistive device: None Gait Pattern/deviations: Step-to pattern Gait velocity: reduced Gait velocity interpretation: <1.8 ft/sec, indicate of risk for recurrent falls General Gait Details: pt with slowed step-to gait, increased lateral sway, one LOB noted when distracted   Stairs             Wheelchair Mobility    Modified Rankin (Stroke Patients Only)        Balance Overall balance assessment: Needs assistance Sitting-balance support: No upper extremity supported;Feet supported Sitting balance-Leahy Scale: Good     Standing balance support: During functional activity;No upper extremity supported Standing balance-Leahy Scale: Good Standing balance comment: close supervision                            Cognition Arousal/Alertness: Awake/alert Behavior During Therapy: Impulsive Overall Cognitive Status: Impaired/Different from baseline Area of Impairment: Attention;Memory;Following commands;Safety/judgement;Awareness;Problem solving                   Current Attention Level: Sustained Memory: Decreased short-term memory Following Commands: Follows one step commands consistently;Follows multi-step commands with increased time Safety/Judgement: Decreased awareness of safety;Decreased awareness of deficits Awareness: Intellectual Problem Solving: Difficulty sequencing;Requires verbal cues General Comments: very easily distracted, especially around females      Exercises      General Comments General comments (skin integrity, edema, etc.): VSS, spouse present in room      Pertinent Vitals/Pain Pain Assessment: No/denies pain    Home Living                      Prior Function            PT Goals (current goals can now be found in the care plan section) Acute Rehab PT Goals Patient Stated Goal: none Time For Goal Achievement: 08/01/20 Progress towards PT goals: Progressing toward goals    Frequency    Min 2X/week  PT Plan Current plan remains appropriate    Co-evaluation              AM-PAC PT "6 Clicks" Mobility   Outcome Measure  Help needed turning from your back to your side while in a flat bed without using bedrails?: A Little Help needed moving from lying on your back to sitting on the side of a flat bed without using bedrails?: A Little Help needed moving to and from a  bed to a chair (including a wheelchair)?: A Little Help needed standing up from a chair using your arms (e.g., wheelchair or bedside chair)?: A Little Help needed to walk in hospital room?: A Little Help needed climbing 3-5 steps with a railing? : A Lot 6 Click Score: 17    End of Session   Activity Tolerance: Patient tolerated treatment well Patient left: in chair;with call bell/phone within reach;with chair alarm set;with family/visitor present Nurse Communication: Mobility status PT Visit Diagnosis: Unsteadiness on feet (R26.81);Muscle weakness (generalized) (M62.81)     Time: 7209-4709 PT Time Calculation (min) (ACUTE ONLY): 14 min  Charges:  $Gait Training: 8-22 mins                     Zenaida Niece, PT, DPT Acute Rehabilitation Pager: [phone]    Zenaida Niece 07/18/2020, 4:38 PM

## 2020-07-18 NOTE — Progress Notes (Signed)
Interim Progress Note  Unfortunately [facility] does not have a current bed available. Per SW [place], they will attempt to relocate other residents to make space. SW is still waiting to hear back from [facility] about acceptance, facility still needs to review patient chart. Patient is medically stable. Discharge pending acceptance to a facility.

## 2020-07-18 NOTE — Progress Notes (Signed)
Patient became combative,  agiatated and adamant to leave the floor despite advice and redirection.  Hw was pleasantly confused .  and cooperative all dayPatient walked to the elevator and wanted to leave together with his wife. [employer] called,  Patient remained agitated,  Dr. Charlynn Court notified and  She came and talked to patient privately.  Patient appeared to be calmer and cooperative,  Agreeable to stay in the room. Will continue to monitor closely.

## 2020-07-18 NOTE — TOC Progression Note (Addendum)
Transition of Care [facility]) - Progression Note    Patient Details  Name: JALEEL ALLEN MRN: [id number] Date of Birth: [birth year]  Transition of Care [facility]) CM/SW [place], LCSW Phone Number: 07/18/2020, 11:05 AM  Clinical Narrative:    11am-CSW received notification from [facility] that they had to move patients around and now do not have a memory care bed. Admissions will contact CSW if a bed becomes available.   12:21pm-CSW heard back from [facility]. They may be able to accept patient today, however, CSW unable to get in touch with patient's wife to sign admission paperwork. CSW contacted [employer] for a wellcare check at her address. TOC Supervisor, Denyse Amass, aware.   1pm-[employer] able to locate wife and requested she come to the hospital.   2pm-Due to concerns regarding wife's transportation access, CSW contacted [employer] to see if they are able to pick her up and bring her to the hospital to do the paperwork. CSW contacted [place] to see who manages patient's SSI check but per Damien Fusi, they have not yet received anything from [place] for the patient. She stated she would contact Social Security and call CSW back. CSW attempted to call Social Security but the line hung up after 20 minutes on hold. [name] needs to find out how they will get paid for the patient.   3:30pm- [employer] arrived to unit with the patient's spouse. CSW completed paperwork with her and allowed [place], [place], and their business office and they confirmed that patient only receives SSI [phone]) and not social security since he did not work enough credits. Per [facility], patient will not have a monthly PMI to pay. CSW emailed paperwork to [facility] with his COVID vaccines record. CSW contacted [facility] ACT team to reschedule his assessment appointment but she was out of the office and requested CSW call them back in the morning. Patient's spouse visited with the patient and CSW left taxi  voucher with Financial controller for patient's spouse and gave her a map to [facility].      Expected Discharge Plan: [place] Barriers to Discharge: SNF Pending bed offer  Expected Discharge Plan and Services Expected Discharge Plan: [place]         Expected Discharge Date: 07/08/20                                     Social Determinants of Health (SDOH) Interventions    Readmission Risk Interventions No flowsheet data found.

## 2020-07-19 DIAGNOSIS — F200 Paranoid schizophrenia: Secondary | ICD-10-CM | POA: Diagnosis not present

## 2020-07-19 DIAGNOSIS — R4182 Altered mental status, unspecified: Secondary | ICD-10-CM | POA: Diagnosis not present

## 2020-07-19 DIAGNOSIS — Z86718 Personal history of other venous thrombosis and embolism: Secondary | ICD-10-CM | POA: Diagnosis not present

## 2020-07-19 NOTE — Progress Notes (Signed)
Report called and given to [place], RN at receiving SNF ([place]), [phone]. No concerns or questions.

## 2020-07-19 NOTE — Plan of Care (Signed)
Not progressing at this time due to altered mental status.   Problem: Education: Goal: Knowledge of disease or condition will improve Outcome: Not Progressing Goal: Knowledge of secondary prevention will improve Outcome: Not Progressing Goal: Knowledge of patient specific risk factors addressed and post discharge goals established will improve Outcome: Not Progressing Goal: Individualized Educational Video(s) Outcome: Not Progressing   Problem: Coping: Goal: Will verbalize positive feelings about self Outcome: Not Progressing Goal: Will identify appropriate support needs Outcome: Not Progressing   Problem: Health Behavior/Discharge Planning: Goal: Ability to manage health-related needs will improve Outcome: Not Progressing   Problem: Education: Goal: Knowledge of General Education information will improve Description: Including pain rating scale, medication(s)/side effects and non-pharmacologic comfort measures Outcome: Not Progressing   Problem: Health Behavior/Discharge Planning: Goal: Ability to manage health-related needs will improve Outcome: Not Progressing   Problem: Clinical Measurements: Goal: Ability to maintain clinical measurements within normal limits will improve Outcome: Not Progressing Goal: Will remain free from infection Outcome: Not Progressing Goal: Diagnostic test results will improve Outcome: Not Progressing Goal: Respiratory complications will improve Outcome: Not Progressing Goal: Cardiovascular complication will be avoided Outcome: Not Progressing   Problem: Activity: Goal: Risk for activity intolerance will decrease Outcome: Not Progressing   Problem: Nutrition: Goal: Adequate nutrition will be maintained Outcome: Not Progressing   Problem: Coping: Goal: Level of anxiety will decrease Outcome: Not Progressing   Problem: Elimination: Goal: Will not experience complications related to bowel motility Outcome: Not Progressing Goal: Will  not experience complications related to urinary retention Outcome: Not Progressing   Problem: Pain Managment: Goal: General experience of comfort will improve Outcome: Not Progressing   Problem: Safety: Goal: Ability to remain free from injury will improve Outcome: Not Progressing

## 2020-07-19 NOTE — TOC Transition Note (Signed)
Transition of Care [facility]) - CM/SW Discharge Note   Patient Details  Name: [name] MRN: [id number] Date of Birth: 01-22-1951  [facility] CM/SW Contact:  Joanne Chars, LCSW Phone Number: 07/19/2020, 11:26 AM   Clinical Narrative:   Pt discharging to [facility].  RN call report to [phone].    Final next level of care: Skilled Nursing Facility Barriers to Discharge: Barriers Resolved   Patient Goals and CMS Choice Patient states their goals for this hospitalization and ongoing recovery are:: Wife okay with SNF or ALF per last admission      Discharge Placement              Patient [facility] bed at: [facility] Patient to be transferred to facility by: [place] Name of family member notified: Percell Locus notified wife [name] Patient and family notified of of transfer: 07/19/20  Discharge Plan and Services                                     Social Determinants of Health (SDOH) Interventions     Readmission Risk Interventions No flowsheet data found.

## 2020-07-19 NOTE — TOC Progression Note (Signed)
Transition of Care [facility]) - Progression Note    Patient Details  Name: [name] MRN: [id number] Date of Birth: 1951/03/04  Transition of Care [facility]) CM/SW Contact  Joanne Chars, [name] Phone Number: 07/19/2020, 9:35 AM  Clinical Narrative:   CSW spoke with Donneta Romberg at [facility] about ACT team admit appt with [facility]--she was not aware of this appt, cannot get pt to an appt this afternoon after he is admitted to [facility] but can plan for this next week.  CSW spoke with Afifa at [facility], gave Shazma contact info and she will coordinate the intake appt.    Expected Discharge Plan: Skilled Nursing Facility Barriers to Discharge: SNF Pending bed offer  Expected Discharge Plan and Services Expected Discharge Plan: [place]         Expected Discharge Date: 07/19/20                                     Social Determinants of Health (SDOH) Interventions    Readmission Risk Interventions No flowsheet data found.

## 2020-07-19 NOTE — Progress Notes (Signed)
Family Medicine Teaching Service Daily Progress Note Intern Pager: [phone]  Patient name: [name] Medical record number: [id number] Date of birth: September 22, 1950 Age: 68 y.o. Gender: male  Primary Care Provider: Danna Hefty, DO Consultants: [name] Code Status: FULL  Pt Overview and Major Events to Date:  11/21: admitted with AMS 11/22: Stable for SNF placement  Assessment and Plan: [name] a 69 y.o.malewho presentedwith altered mental status. PMH is significant forschizophrenia, HFpEF,HTN, HLD, BPH, tobacco abuse, GERD, history of hep C s/p Harvoni,andhistory of hep B cleared.Medically stable for discharge to SNF when bed available.   Schizophrenia: chronic, stable Patient has been accepted to [facility]. Rapid COVID swab negative. Awaiting transfer. -Next dose ofFluphenazine 25mg  IMdue on 12/6 - Continue Cogentin 1mg  BID - Continue Trazodone 50mg  daily at bedtime and Ramelteon 8mg  daily at bedtime - Atarax 25mg PRNat bedtime for agitation - 1:1 sitter - Appreciate social work assistance with placement -ACT team to see patient outpatient  Hx of Displaced Right Fifth Metatarsal Fracture s/p ORIF: stable  Fracture boot removed per orthopedic recommendations.  - Continue PT   Hx of Provoked RLE DVT: stable Diagnosedin September 2021. Started on Xarelto at that time. - Continue Xarelto 20mg  dailyuntil 12/15  HTN: chronic, stable Mildly hypertensive this morning to 143/73 around 0345. Additionally one hypotensive reading of 106/33 at 2000 last night. He has otherwise remained stable in the last 24 hours. - Continue Amlodipine 10mg  daily  BPH: chronic, stable -Continue Flomax 0.8mg  daily  GERD: chronic, stable -Continue Pepcid 20mg  BID  Tobacco Use -Nicotine patch 7mg  daily  FEN/GI:Heart healthy FGH:[id number]   Status is: Inpatient  Remains inpatient appropriate because:Pending SNF placement   Dispo:  Patient From: [place]  Planned Disposition: [place]  Expected discharge date: 07/19/20  Medically stable for discharge: Yes   Subjective:  [name] has no complaints this morning. He is seen in his room eating breakfast.   Objective: Temp:  [98 F (36.7 C)-98.9 F (37.2 C)] 98.2 F (36.8 C) (12/03 0345) Pulse Rate:  [62-87] 62 (12/03 0345) Resp:  [18-20] 18 (12/03 0345) BP: (105-143)/(33-85) 143/73 (12/03 0345) SpO2:  [99 %-100 %] 100 % (12/03 0345) Physical Exam: General: NAD, eating banana in bed. Speech is mumbled and difficult to comprehend. Cardiovascular: RRR, no murmur appreciated Respiratory: CTAB, no wheezing/rhonchi/rales Abdomen: soft, non-tender, normoactive bowel sounds  Laboratory: No results for input(s): WBC, HGB, HCT, PLT in the last 168 hours. No results for input(s): NA, K, CL, CO2, BUN, CREATININE, CALCIUM, PROT, BILITOT, ALKPHOS, ALT, AST, GLUCOSE in the last 168 hours.  Invalid input(s): LABALBU  Imaging/Diagnostic Tests: None new  Sharion Settler, DO 07/19/2020, 6:13 AM PGY-1, [place] Intern pager: [phone], text pages welcome

## 2020-07-19 NOTE — Care Management Important Message (Signed)
Important Message  Patient Details  Name: [name] MRN: [id number] Date of Birth: April 02, 1951   Medicare Important Message Given:  Yes  Due to staffing called the patient room no answer signed IM mailed to the patient home address.     Zekiah Caruth 07/19/2020, 2:15 PM

## 2020-07-19 NOTE — Plan of Care (Signed)

## 2020-10-28 ENCOUNTER — Emergency Department (HOSPITAL_COMMUNITY): Payer: Medicare Other

## 2020-10-28 ENCOUNTER — Inpatient Hospital Stay (HOSPITAL_COMMUNITY)
Admission: EM | Admit: 2020-10-28 | Discharge: 2020-11-01 | DRG: 871 | Disposition: A | Discharge status: Skilled Nursing Facility | Payer: Medicare Other | Source: Skilled Nursing Facility | Attending: Family Medicine | Admitting: Family Medicine

## 2020-10-28 ENCOUNTER — Other Ambulatory Visit: Payer: Self-pay

## 2020-10-28 DIAGNOSIS — F0390 Unspecified dementia without behavioral disturbance: Secondary | ICD-10-CM | POA: Diagnosis present

## 2020-10-28 DIAGNOSIS — D649 Anemia, unspecified: Secondary | ICD-10-CM | POA: Diagnosis present

## 2020-10-28 DIAGNOSIS — N390 Urinary tract infection, site not specified: Secondary | ICD-10-CM | POA: Diagnosis present

## 2020-10-28 DIAGNOSIS — E876 Hypokalemia: Secondary | ICD-10-CM | POA: Diagnosis present

## 2020-10-28 DIAGNOSIS — Z7901 Long term (current) use of anticoagulants: Secondary | ICD-10-CM

## 2020-10-28 DIAGNOSIS — I5032 Chronic diastolic (congestive) heart failure: Secondary | ICD-10-CM | POA: Diagnosis present

## 2020-10-28 DIAGNOSIS — Z8249 Family history of ischemic heart disease and other diseases of the circulatory system: Secondary | ICD-10-CM | POA: Diagnosis not present

## 2020-10-28 DIAGNOSIS — D696 Thrombocytopenia, unspecified: Secondary | ICD-10-CM | POA: Diagnosis present

## 2020-10-28 DIAGNOSIS — Z20822 Contact with and (suspected) exposure to covid-19: Secondary | ICD-10-CM | POA: Diagnosis present

## 2020-10-28 DIAGNOSIS — Z823 Family history of stroke: Secondary | ICD-10-CM | POA: Diagnosis not present

## 2020-10-28 DIAGNOSIS — F200 Paranoid schizophrenia: Secondary | ICD-10-CM | POA: Diagnosis present

## 2020-10-28 DIAGNOSIS — F17200 Nicotine dependence, unspecified, uncomplicated: Secondary | ICD-10-CM | POA: Diagnosis present

## 2020-10-28 DIAGNOSIS — I482 Chronic atrial fibrillation, unspecified: Secondary | ICD-10-CM | POA: Diagnosis present

## 2020-10-28 DIAGNOSIS — E860 Dehydration: Secondary | ICD-10-CM | POA: Diagnosis present

## 2020-10-28 DIAGNOSIS — N1831 Chronic kidney disease, stage 3a: Secondary | ICD-10-CM | POA: Diagnosis present

## 2020-10-28 DIAGNOSIS — G9341 Metabolic encephalopathy: Secondary | ICD-10-CM | POA: Diagnosis present

## 2020-10-28 DIAGNOSIS — Z880 Allergy status to penicillin: Secondary | ICD-10-CM

## 2020-10-28 DIAGNOSIS — N179 Acute kidney failure, unspecified: Secondary | ICD-10-CM | POA: Diagnosis present

## 2020-10-28 DIAGNOSIS — Z1612 Extended spectrum beta lactamase (ESBL) resistance: Secondary | ICD-10-CM | POA: Diagnosis present

## 2020-10-28 DIAGNOSIS — R410 Disorientation, unspecified: Secondary | ICD-10-CM | POA: Diagnosis present

## 2020-10-28 DIAGNOSIS — Z833 Family history of diabetes mellitus: Secondary | ICD-10-CM

## 2020-10-28 DIAGNOSIS — N3000 Acute cystitis without hematuria: Secondary | ICD-10-CM | POA: Diagnosis not present

## 2020-10-28 DIAGNOSIS — N400 Enlarged prostate without lower urinary tract symptoms: Secondary | ICD-10-CM | POA: Diagnosis present

## 2020-10-28 DIAGNOSIS — G9340 Encephalopathy, unspecified: Secondary | ICD-10-CM | POA: Diagnosis not present

## 2020-10-28 DIAGNOSIS — E785 Hyperlipidemia, unspecified: Secondary | ICD-10-CM | POA: Diagnosis present

## 2020-10-28 DIAGNOSIS — A4151 Sepsis due to Escherichia coli [E. coli]: Principal | ICD-10-CM | POA: Diagnosis present

## 2020-10-28 DIAGNOSIS — I130 Hypertensive heart and chronic kidney disease with heart failure and stage 1 through stage 4 chronic kidney disease, or unspecified chronic kidney disease: Secondary | ICD-10-CM | POA: Diagnosis present

## 2020-10-28 LAB — CBC WITH DIFFERENTIAL/PLATELET
Abs Immature Granulocytes: 0.03 10*3/uL (ref 0.00–0.07)
Basophils Absolute: 0 10*3/uL (ref 0.0–0.1)
Basophils Relative: 0 %
Eosinophils Absolute: 0 10*3/uL (ref 0.0–0.5)
Eosinophils Relative: 0 %
HCT: 35.4 % — ABNORMAL LOW (ref 39.0–52.0)
Hemoglobin: 11.8 g/dL — ABNORMAL LOW (ref 13.0–17.0)
Immature Granulocytes: 0 %
Lymphocytes Relative: 6 %
Lymphs Abs: 0.6 10*3/uL — ABNORMAL LOW (ref 0.7–4.0)
MCH: 29.8 pg (ref 26.0–34.0)
MCHC: 33.3 g/dL (ref 30.0–36.0)
MCV: 89.4 fL (ref 80.0–100.0)
Monocytes Absolute: 1 10*3/uL (ref 0.1–1.0)
Monocytes Relative: 11 %
Neutro Abs: 8.1 10*3/uL — ABNORMAL HIGH (ref 1.7–7.7)
Neutrophils Relative %: 83 %
Platelets: 147 10*3/uL — ABNORMAL LOW (ref 150–400)
RBC: 3.96 MIL/uL — ABNORMAL LOW (ref 4.22–5.81)
RDW: 14.6 % (ref 11.5–15.5)
WBC: 9.7 10*3/uL (ref 4.0–10.5)
nRBC: 0 % (ref 0.0–0.2)

## 2020-10-28 LAB — URINALYSIS, ROUTINE W REFLEX MICROSCOPIC
Bilirubin Urine: NEGATIVE
Glucose, UA: NEGATIVE mg/dL
Ketones, ur: NEGATIVE mg/dL
Nitrite: POSITIVE — AB
Protein, ur: 30 mg/dL — AB
Specific Gravity, Urine: 1.015 (ref 1.005–1.030)
WBC, UA: >50 WBC/hpf — ABNORMAL HIGH (ref 0–5)
pH: 5 (ref 5.0–8.0)

## 2020-10-28 LAB — ETHANOL: Alcohol, Ethyl (B): <10 mg/dL

## 2020-10-28 LAB — COMPREHENSIVE METABOLIC PANEL WITH GFR
ALT: 29 U/L (ref 0–44)
AST: 36 U/L (ref 15–41)
Albumin: 3.8 g/dL (ref 3.5–5.0)
Alkaline Phosphatase: 45 U/L (ref 38–126)
Anion gap: 8 (ref 5–15)
BUN: 33 mg/dL — ABNORMAL HIGH (ref 8–23)
CO2: 27 mmol/L (ref 22–32)
Calcium: 9.2 mg/dL (ref 8.9–10.3)
Chloride: 107 mmol/L (ref 98–111)
Creatinine, Ser: 1.65 mg/dL — ABNORMAL HIGH (ref 0.61–1.24)
GFR, Estimated: 45 mL/min — ABNORMAL LOW
Glucose, Bld: 125 mg/dL — ABNORMAL HIGH (ref 70–99)
Potassium: 4.6 mmol/L (ref 3.5–5.1)
Sodium: 142 mmol/L (ref 135–145)
Total Bilirubin: 1.1 mg/dL (ref 0.3–1.2)
Total Protein: 7.7 g/dL (ref 6.5–8.1)

## 2020-10-28 LAB — COMPREHENSIVE METABOLIC PANEL
ALT: 32 U/L (ref 0–44)
AST: 39 U/L (ref 15–41)
Albumin: 3.2 g/dL — ABNORMAL LOW (ref 3.5–5.0)
Alkaline Phosphatase: 42 U/L (ref 38–126)
Anion gap: 9 (ref 5–15)
BUN: 26 mg/dL — ABNORMAL HIGH (ref 8–23)
CO2: 24 mmol/L (ref 22–32)
Calcium: 8.5 mg/dL — ABNORMAL LOW (ref 8.9–10.3)
Chloride: 109 mmol/L (ref 98–111)
Creatinine, Ser: 1.4 mg/dL — ABNORMAL HIGH (ref 0.61–1.24)
GFR, Estimated: 54 mL/min — ABNORMAL LOW (ref >60)
Glucose, Bld: 123 mg/dL — ABNORMAL HIGH (ref 70–99)
Potassium: 3.5 mmol/L (ref 3.5–5.1)
Sodium: 142 mmol/L (ref 135–145)
Total Bilirubin: 1.2 mg/dL (ref 0.3–1.2)
Total Protein: 6.6 g/dL (ref 6.5–8.1)

## 2020-10-28 LAB — RESP PANEL BY RT-PCR (FLU A&B, COVID) ARPGX2
Influenza A by PCR: NEGATIVE
Influenza B by PCR: NEGATIVE
SARS Coronavirus 2 by RT PCR: NEGATIVE

## 2020-10-28 LAB — APTT: aPTT: 32 seconds (ref 24–36)

## 2020-10-28 LAB — LACTIC ACID, PLASMA: Lactic Acid, Venous: 0.8 mmol/L (ref 0.5–1.9)

## 2020-10-28 LAB — PHOSPHORUS: Phosphorus: 3.2 mg/dL (ref 2.5–4.6)

## 2020-10-28 LAB — MAGNESIUM: Magnesium: 2 mg/dL (ref 1.7–2.4)

## 2020-10-28 LAB — CBG MONITORING, ED: Glucose-Capillary: 109 mg/dL — ABNORMAL HIGH (ref 70–99)

## 2020-10-28 LAB — AMMONIA: Ammonia: 11 umol/L (ref 9–35)

## 2020-10-28 LAB — PROTIME-INR
INR: 1.3 — ABNORMAL HIGH (ref 0.8–1.2)
Prothrombin Time: 16.1 seconds — ABNORMAL HIGH (ref 11.4–15.2)

## 2020-10-28 LAB — TSH: TSH: 0.633 u[IU]/mL (ref 0.350–4.500)

## 2020-10-28 LAB — CK: Total CK: 310 U/L (ref 49–397)

## 2020-10-28 MED ORDER — TAMSULOSIN HCL 0.4 MG PO CAPS
0.8000 mg | ORAL_CAPSULE | Freq: Every morning | ORAL | Status: DC
  Administered 2020-10-29 – 2020-11-01 (×4): 0.8 mg via ORAL
  Filled 2020-10-28 (×4): qty 2

## 2020-10-28 MED ORDER — AMLODIPINE BESYLATE 10 MG PO TABS
10.0000 mg | ORAL_TABLET | Freq: Every day | ORAL | Status: DC
Start: 2020-10-28 — End: 2020-11-01
  Administered 2020-10-28 – 2020-11-01 (×5): 10 mg via ORAL
  Filled 2020-10-28 (×5): qty 1

## 2020-10-28 MED ORDER — THIAMINE HCL 100 MG/ML IJ SOLN
100.0000 mg | Freq: Every day | INTRAMUSCULAR | Status: DC
  Administered 2020-10-28 – 2020-10-29 (×2): 100 mg via INTRAVENOUS
  Filled 2020-10-28 (×2): qty 2

## 2020-10-28 MED ORDER — RIVAROXABAN 20 MG PO TABS
20.0000 mg | ORAL_TABLET | Freq: Every day | ORAL | Status: DC
  Administered 2020-10-28: 20 mg via ORAL
  Filled 2020-10-28: qty 1

## 2020-10-28 MED ORDER — SODIUM CHLORIDE 0.9 % IV SOLN: INTRAVENOUS | Status: DC

## 2020-10-28 MED ORDER — ACETAMINOPHEN 325 MG PO TABS
650.0000 mg | ORAL_TABLET | Freq: Once | ORAL | Status: AC
  Administered 2020-10-28: 650 mg via ORAL
  Filled 2020-10-28: qty 2

## 2020-10-28 MED ORDER — BENZTROPINE MESYLATE 1 MG PO TABS
1.0000 mg | ORAL_TABLET | Freq: Two times a day (BID) | ORAL | Status: DC
  Administered 2020-10-28 – 2020-11-01 (×8): 1 mg via ORAL
  Filled 2020-10-28 (×8): qty 1

## 2020-10-28 MED ORDER — FOLIC ACID 5 MG/ML IJ SOLN
1.0000 mg | Freq: Every day | INTRAMUSCULAR | Status: DC
  Administered 2020-10-28 – 2020-10-29 (×2): 1 mg via INTRAVENOUS
  Filled 2020-10-28 (×3): qty 0.2

## 2020-10-28 MED ORDER — ACETAMINOPHEN 325 MG PO TABS
650.0000 mg | ORAL_TABLET | Freq: Four times a day (QID) | ORAL | Status: DC | PRN
  Administered 2020-10-28 – 2020-10-31 (×3): 650 mg via ORAL
  Filled 2020-10-28 (×3): qty 2

## 2020-10-28 MED ORDER — SODIUM CHLORIDE 0.9 % IV BOLUS
1000.0000 mL | Freq: Once | INTRAVENOUS | Status: AC
  Administered 2020-10-28: 1000 mL via INTRAVENOUS

## 2020-10-28 MED ORDER — ADULT MULTIVITAMIN W/MINERALS CH
1.0000 | ORAL_TABLET | Freq: Every day | ORAL | Status: DC
  Administered 2020-10-28 – 2020-11-01 (×5): 1 via ORAL
  Filled 2020-10-28 (×5): qty 1

## 2020-10-28 MED ORDER — SODIUM CHLORIDE 0.9 % IV SOLN
1.0000 g | Freq: Two times a day (BID) | INTRAVENOUS | Status: DC
  Administered 2020-10-28: 1 g via INTRAVENOUS
  Filled 2020-10-28 (×3): qty 1

## 2020-10-28 MED ORDER — ACETAMINOPHEN 650 MG RE SUPP: 650.0000 mg | Freq: Four times a day (QID) | RECTAL | Status: DC | PRN

## 2020-10-28 NOTE — H&P (Addendum)
[facility] Admission History and Physical Service Pager: [phone]  Patient name: [name] Medical record number: [id number] Date of birth: 02-04-1951 Age: 70 y.o. Gender: male  Primary Care Provider: Danna Hefty, DO Consultants: None Code Status: Full Preferred Emergency Contact:  Contact Information    Name Relation Home Work [place] Spouse [phone]  [phone]      Chief Complaint: Altered mental status  Assessment and Plan: [name] is a 70 y.o. male presenting with altered mental status and found to have a UTI as well. PMH is significant for primary schizophrenia, HFpEF, HTN, HLD, BPH, tobacco, abuse, GERD, hx of Hep C s/p Harvoni, hx of Hep B cleared.  UTI, meeting sepsis criteria  Patient is well-known to family medicine with multiple admissions in the last few months. Pt came from [place] home with acute worsening of his mental status. Unable to get history from pt due to AMD. Per ER note-staff found pt to be lethargic, with abnormal gait and shaking this morning at 7am. He was last seen well at 11pm last night.  initially admitted to IM service. On admission, patient was able to follow several specific commands, though no comprehensible verbal responses. Vitals on initial presentation: BP 153/93,RR 25, HR 84-119, T-max 102.8, sats 100% on air. UA positive for: Nitrites, leukocytes. Urine culture pending.  Labs: Creatinine 1.65, GFR 45, Hb 11.8, neutrophils 8.1, INR 1.3 glucose 125, lactic acid 0.8. Ammonia 11, CK 310, INR 1.3. Covid negative. Will admit for treatment of UTI 2/2 sepsis and monitoring of acute encephalopathy which is likely 2/2 to infection. S/p 1L saline bolus in the ER.  - Admit to progressive, attending Dr. Ardelia Mems - Vitals per floor routine -IV cefepime 1 g twice daily, (pt has allergy to penicillin, however pt has tolerated cefepime before on recent admission) - N.p.o. -SLP  eval -Normal saline 100cc/hr - Bedrest - Delirium precautions - Follow-up blood cultures - Follow-up urine culture - AM CBC, CMP -PT/OT am -Night team to review mental status  Acute encephalopathy  Schizophrenia Likely secondary to sepsis secondary to UTI.  Patient has poor baseline secondary to schizophrenia.  Ammonia 11 on admission.  CT head was obtained without acute abnormality.  CBG on admission 125.  Other differentials that are considered include antipsychotic withdrawal, accidental ingestion of medications (less likely given lack of metabolic derangement). Unsure of when the patient last was given Fluphenazine and will need to call [facility] during day hours to figure out if it has been administered since his last hospitalization. Patient has other chronic medications including trazodone and hydroxyzine that he takes that are currently being held. Once patient is back to baseline we will likely restart medications as appropriate.  Home meds: Fluphenazine 25 mg every 14 days. - Hold fluphenazine until last dosing confirmed for appropriate treatment - Thiamine 412 mg IV - Folic acid 1 mg IV - Holding home trazodone, hydroxyzine and ramelteon as pt is altered, restarted when pt is back to his baseline -If becomes acutely agitated: redirect pt and 1:1sitter  AKI on CKD stage IIIa Creatinine 1.65 on admission, patient received 1 L bolus LR and creatinine trended to 1.4.  Baseline 1.22 3 months ago.  Likely prerenal in the setting of current infection with fever. -Monitor with BMP -Avoid nephrotoxic agents -Normal saline 100cc/hr  Hypertension BP 153/93 on admission. Home meds: Amlodipine 10 mg - Continue to monitor BPs - Continue amlodipine  Hx of Provoked DVT S/p Left Lisfranc fracture, ORIF 04/30/20 Home meds: Xarelto 20 mg daily -Continue anticoagulation Xarelto 20 mg daily  History of A. Fib HR 84-119, physical exam did not appreciate irregular  rhythm. Home meds: No known rate control, anticoagulation Xarelto 20 mg daily - Continue Xarelto 20 mg daily  Normocytic anemia Hb 11.8 today, baseline appears to be 10-11.  No signs of acute bleed on examination today, likely in the setting of chronic kidney disease. Last colonoscopy was in 2016: polyp in recto-sigmoid colon which was removed. - Monitor with CBC -Recommended outpatient colonoscopy  BPH Chronic, stable.  Home medications include tamsulosin 0.8mg   - Continue home tamsulosin 0.8mg    Nicotine dependence History of nicotine dependence and previously used nicotine patches on admission. -Will order nicotine patch available upon request  FEN/GI: NPO, awaiting SLP eval, normal saline 100cc/hr Prophylaxis: On Xarelto  Disposition: Progressive  History of Present Illness:  [name] is a 70 y.o. male presenting with altered mental status.  As we assumed care of the patient from internal medicine, the rest of the history was obtained from their team.  Last known normal was at 11pm last night. This morning, staff at [facility] reported that the patient was lethargic with an abnormal gait and shaking.  In the ED, patient was found to be febrile to 100.7, he was given Tylenol but the fever rose to 102.9. Patient was initially difficult to arouse for the initial evaluating teams. CXR was unremarkable and CT head was normal.   Review Of Systems: Per HPI with the following additions:   Review of Systems  Unable to perform ROS: Mental status change     Patient Active Problem List   Diagnosis Date Noted  . Closed fracture of bone of right foot   . Altered mental status 07/07/2020  . Insomnia 06/21/2020  . Anxiety 06/18/2020  . Acute encephalopathy   . Cognitive impairment 06/12/2020  . AMS (altered mental status) 06/05/2020  . H/O noncompliance with medical treatment, presenting hazards to health   . DVT (deep venous thrombosis) ([place]) 06/04/2020  . Schizophrenia ([place])    . Homeless   . Dislocation of tarsometatarsal joint   . History of hepatitis B 04/26/2020  . Chronic hepatitis ([place])   . Dementia without behavioral disturbance ([place])   . Atrial fibrillation with RVR ([place]) 04/24/2020  . Malnutrition of moderate degree 04/24/2020  . History of drug abuse ([place]) 10/18/2017  . Tobacco abuse   . Diastolic heart failure ([place]) 02/05/2015  . Healthcare maintenance 05/28/2014  . Hyperlipidemia 03/10/2014  . [place] OCCLUSION 06/03/2010  . PARANOID SCHIZOPHRENIA, CHRONIC 01/17/2009  . HYPERTENSION, BENIGN ESSENTIAL 01/17/2009  . BPH (benign prostatic hyperplasia) 01/17/2009  . History of hepatitis C 12/14/2008  . DEPRESSION 12/14/2008    Past Medical History: Past Medical History:  Diagnosis Date  . Acute encephalopathy 04/23/2020  . Acute metabolic encephalopathy 01/22/6719  . AKI (acute kidney injury) ([place]) 05/12/2020  . BPH (benign prostatic hyperplasia)   . Closed displaced fracture of fifth metatarsal bone of right foot   . Colon polyp 2010  . Crush injury   . Depression   . GERD (gastroesophageal reflux disease)   . Hepatitis C    "caught it when I had a blood transfusion"  . High cholesterol   . History of blood transfusion    "when I was young"  . Hypertension   . Paranoid schizophrenia ([place])   . PARANOID SCHIZOPHRENIA, CHRONIC 01/17/2009  .  Prostate atrophy   . Retinal vein occlusion     Past Surgical History: Past Surgical History:  Procedure Laterality Date  . CIRCUMCISION  1959  . ORIF TOE FRACTURE Right 04/30/2020   Procedure: OPEN REDUCTION INTERNAL FIXATION (ORIF) RIGHT FOOT METATARSAL FRACTURES;  Surgeon: Newt Minion, MD;  Location: [facility];  Service: Orthopedics;  Laterality: Right;  . TONSILLECTOMY      Social History: Social History   Tobacco Use  . Smoking status: Current Every Day Smoker    Packs/day: 0.50    Years: 48.00    Pack years: 24.00    Types: Cigarettes  . Smokeless tobacco: Never Used   Substance Use Topics  . Alcohol use: Yes  . Drug use: No   Additional social history:  Please also refer to relevant sections of EMR.  Family History: Family History  Problem Relation Age of Onset  . Hypertension Mother   . Diabetes Mother   . Stroke Mother     Allergies and Medications: Allergies  Allergen Reactions  . Lisinopril Cough  . Penicillins Hives  . Amoxicillin Rash  . Ibuprofen Rash   No current facility-administered medications on file prior to encounter.   Current Outpatient Medications on File Prior to Encounter  Medication Sig Dispense Refill  . acetaminophen (TYLENOL) 325 MG tablet Take 2 tablets (650 mg total) by mouth every 6 (six) hours as needed for mild pain, moderate pain or headache. 30 tablet 0  . amLODipine (NORVASC) 10 MG tablet Take 1 tablet (10 mg total) by mouth daily. 30 tablet 0  . benztropine (COGENTIN) 1 MG tablet Take 1 tablet (1 mg total) by mouth 2 (two) times daily. (Patient taking differently: Take 1 mg by mouth 2 (two) times daily. For tardive dyskinesia) 60 tablet 0  . cetirizine (ZYRTEC ALLERGY) 10 MG tablet Take 1 tablet (10 mg total) by mouth daily. 30 tablet 1  . famotidine (PEPCID) 20 MG tablet Take 1 tablet (20 mg total) by mouth 2 (two) times daily. (Patient taking differently: Take 20 mg by mouth every morning.) 60 tablet   . fluPHENAZine decanoate (PROLIXIN) 25 MG/ML injection Inject 1 mL (25 mg total) into the muscle every 14 (fourteen) days. Last dose 07/08/2020. Next dose due 07/22/2020. (Patient taking differently: Inject 25 mg into the muscle every 14 (fourteen) days.) 5 mL 11  . folic acid (FOLVITE) 1 MG tablet Take 1 tablet (1 mg total) by mouth daily. 30 tablet 0  . Multiple Vitamin (MULTIVITAMIN WITH MINERALS) TABS tablet Take 1 tablet by mouth daily. 30 tablet 6  . ramelteon (ROZEREM) 8 MG tablet Take 1 tablet (8 mg total) by mouth at bedtime. 30 tablet 0  . tamsulosin (FLOMAX) 0.4 MG CAPS capsule TAKE 2 CAPSULE BY MOUTH  DAILY (Patient taking differently: Take 0.8 mg by mouth every morning.) 60 capsule 2  . thiamine 100 MG tablet Take 1 tablet (100 mg total) by mouth daily. 30 tablet 0  . traZODone (DESYREL) 50 MG tablet Take 1 tablet (50 mg total) by mouth at bedtime.    . vitamin C (ASCORBIC ACID) 500 MG tablet Take 500 mg by mouth daily.     . hydrOXYzine (ATARAX/VISTARIL) 25 MG tablet Take 1 tablet (25 mg total) by mouth at bedtime as needed for anxiety (Anxiety, Insomnia). (Patient not taking: No sig reported) 30 tablet 0  . rivaroxaban (XARELTO) 20 MG TABS tablet Take 1 tablet (20 mg total) by mouth daily with supper. (Patient not taking: No sig  reported) 30 tablet   . [DISCONTINUED] loratadine (CLARITIN) 10 MG tablet Take 1 tablet (10 mg total) by mouth daily. 30 tablet 0    Objective: BP 130/79 (BP Location: Left Arm)   Pulse 87   Temp (!) 101.9 F (38.8 C) (Axillary)   Resp 20   Ht 5\' 9"  (1.753 m)   Wt 64 kg   SpO2 99%   BMI 20.84 kg/m  Exam: General --initially asleep but easily awoken, shaking/shivering on exam without any blankets present, patient attempted to say his name when asking A&O questions but became but very mumbled HEENT -- Head is normocephalic. EOMI. Ears, nose and throat were benign.  Lips very dry and cracked, some smacking movements (patient has them at baseline) Neck -- supple; no bruits. Integument -- intact. No rash, erythema, or ecchymoses.  Chest -- good expansion. Lungs clear to auscultation.  No increased work of breathing, comfortable on room air Cardiac -- RRR. No murmurs noted.  Abdomen --soft, nontender, nondistended, bowel sounds present Genital -- condom catheter in place, no obvious rashes noted CNS --patient able to follow commands such as wiggling his toes, able to grasp hand on command, closed eyes in reaction to light when assessing pupils Extremeties -trace peripheral edema noted on bilateral lower extremity. Dorsalis pedis pulses present and symmetrical.     Labs and Imaging: CBC BMET  Recent Labs  Lab 10/28/20 0947  WBC 9.7  HGB 11.8*  HCT 35.4*  PLT 147*   Recent Labs  Lab 10/28/20 1658  NA 142  K 3.5  CL 109  CO2 24  BUN 26*  CREATININE 1.40*  GLUCOSE 123*  CALCIUM 8.5*     EKG: My own interpretation (not copied from electronic read)    DG Chest 2 View  Result Date: 10/28/2020 CLINICAL DATA:  Altered mental status EXAM: CHEST - 2 VIEW COMPARISON:  May 12, 2020 FINDINGS: Lungs are clear. Heart is upper normal in size with pulmonary vascularity normal. No adenopathy. No bone lesions. IMPRESSION: Lungs clear.  Heart upper normal in size. Electronically Signed   By: Lowella Grip III M.D.   On: 10/28/2020 10:25   CT Head Wo Contrast  Result Date: 10/28/2020 CLINICAL DATA:  70 year old male with delirium EXAM: CT HEAD WITHOUT CONTRAST TECHNIQUE: Contiguous axial images were obtained from the base of the skull through the vertex without intravenous contrast. COMPARISON:  07/07/2020 FINDINGS: Brain: No acute intracranial hemorrhage. No midline shift. No mass effect. Similar degree cerebral volume loss. Similar appearance of confluent periventricular hyperdensity. Gray-white differentiation is maintained. Unremarkable appearance of the ventricles compared to the prior. Vascular: Intracranial atherosclerosis. Skull: No acute fracture.  No aggressive bone lesion identified. Sinuses/Orbits: Unremarkable appearance of the orbits. Mastoid air cells clear. No middle ear effusion. No significant sinus disease. Other: None IMPRESSION: Negative for acute intracranial abnormality. Similar appearance of advanced chronic microvascular ischemic disease with associated intracranial atherosclerosis, with associated cerebral volume loss. Electronically Signed   By: Corrie Mckusick D.O.   On: 10/28/2020 11:46    [name]  PGY-1, [place] Intern pager: [phone], text pages welcome  FPTS Upper-Level Resident  Addendum   I have independently interviewed and examined the patient. I have discussed the above with the original author and agree with their documentation. My edits for correction/addition/clarification are in black. Please see also any attending notes.   Lattie Haw MD PGY-2, [place] Medicine 10/28/2020 9:37 PM  [place] Service pager: [phone] (text pages welcome through  [facility])

## 2020-10-28 NOTE — Progress Notes (Signed)
Family Medicine Teaching Service Daily Progress Note Intern Pager: [phone]  Patient name: [name] Medical record number: [id number] Date of birth: December 07, 1950 Age: 70 y.o. Gender: male  Primary Care Provider: Danna Hefty, DO Consultants: None Code Status: Full  Pt Overview and Major Events to Date:  3/14 - Admitted  Assessment and Plan: [name] is a 70 y.o. male presenting with altered mental status and found to have a UTI as well. PMH is significant for primary schizophrenia, HFpEF, HTN, HLD, BPH, tobacco, abuse, GERD, hx of Hep C s/p Harvoni, hx of Hep B cleared.  ESBL bacteremia Sepsis 2/2 UTI, improving VSS. Cefepime (3/14), discontinued after blood culture positive for ESBL. Likely will need 10 days of treatment. Patient was febrile overnight, which in light of the patient having ESBL and needed a carbapenem for treatment it is understandable and we will could antibiotic days beginning with first dose of meropenem.  - NPO pending speech eval and mental status improvement - Meropenem per pharmacy - Continue IVF - Bedrest  - Delirium precautions - F/u blood and urine cultures  Acute encephalopathy  Schizophrenia :  improving Patient continues to have some alteration of baseline, he was able to say his name somewhat but that was unrecognizable by people who have never seen him before. Patient still remains tremulous  - Holding fluphenazine until confirmation and last date given - Continue thiamine and folic acid - Holding home trazodone and hydroxyzine until patient at baseline - PT/OT  AKI on CKD Stage IIIa Cr 1.65>1.4>1.58. Baseline 1.22.  - Monitor with BMP - Continue IVF  Thrombocytopenia Platelets 147>88, patient has had thrombocytopenia to similar measurements in prior admissions. Patient appears to have several measurements that indicate a chronic platelet count between 100-200.  - Continue to monitor  HTN: chronic, stable BP since admission  119-173/70-93 - Continue home amlodipine - Continue to monitor BP  Hx of afib: chronic, stable - Continue home xarelto  BPH, chronic, stable - Continue home tamsulosin  Anticoagulation Patient was started on Xarelto per Internal Medicine (who got the original admission and completed orders) and it was not caught by our team, but patient had been on xarelto previously due to a provoked DVT after a broken foot that was surgically repaired. Patient completed course of Xarelto on 12/15. We will discuss today to determine DVT PPx, as either way the xarelto would need to be renally dosed. - will investigate if he has been given outpatient - Lovenox starting tonight    FEN/GI: NPO awaiting swallow eval PPx: Lovenox   Status is: Inpatient  Remains inpatient appropriate because:Altered mental status and Inpatient level of care appropriate due to severity of illness   Dispo: The patient is from: SNF              Anticipated d/c is to: SNF              Patient currently is not medically stable to d/c.   Difficult to place patient No    Subjective:  Patient is not able to report any issues currently due to incoherent speech and some alteration of mental status from baseline.   Objective: Temp:  [99.6 F (37.6 C)-102.9 F (39.4 C)] 101.9 F (38.8 C) (03/14 2038) Pulse Rate:  [78-119] 87 (03/14 2038) Resp:  [14-31] 20 (03/14 2038) BP: (119-173)/(70-92) 130/79 (03/14 2038) SpO2:  [81 %-100 %] 99 % (03/14 2038) Physical Exam: General: NAD, supine in bed, tremulous  Cardiovascular: RRR, no murmur  appreciated but patient was attempting to talk during exam and was difficult to appreciate Respiratory: CTAB, no increased WOB, comfortable on room air Abdomen: soft, non-tender, non-distended, bowel sounds present Extremities: no edema in BLE, able to move toes/feet to commands  Laboratory: Recent Labs  Lab 10/28/20 0947 10/29/20 0329  WBC 9.7 10.4  HGB 11.8* 11.1*  HCT 35.4* 32.3*   PLT 147* 88*   Recent Labs  Lab 10/28/20 0947 10/28/20 1658 10/29/20 0329  NA 142 142 142  K 4.6 3.5 3.7  CL 107 109 111  CO2 27 24 24   BUN 33* 26* 24*  CREATININE 1.65* 1.40* 1.58*  CALCIUM 9.2 8.5* 8.5*  PROT 7.7 6.6 6.3*  BILITOT 1.1 1.2 1.1  ALKPHOS 45 42 42  ALT 29 32 33  AST 36 39 43*  GLUCOSE 125* 123* 113*    Imaging/Diagnostic Tests: CT Head Wo Contrast  Result Date: 10/28/2020 CLINICAL DATA:  70 year old male with delirium EXAM: CT HEAD WITHOUT CONTRAST TECHNIQUE: Contiguous axial images were obtained from the base of the skull through the vertex without intravenous contrast. COMPARISON:  07/07/2020 FINDINGS: Brain: No acute intracranial hemorrhage. No midline shift. No mass effect. Similar degree cerebral volume loss. Similar appearance of confluent periventricular hyperdensity. Gray-white differentiation is maintained. Unremarkable appearance of the ventricles compared to the prior. Vascular: Intracranial atherosclerosis. Skull: No acute fracture.  No aggressive bone lesion identified. Sinuses/Orbits: Unremarkable appearance of the orbits. Mastoid air cells clear. No middle ear effusion. No significant sinus disease. Other: None IMPRESSION: Negative for acute intracranial abnormality. Similar appearance of advanced chronic microvascular ischemic disease with associated intracranial atherosclerosis, with associated cerebral volume loss. Electronically Signed   By: Corrie Mckusick D.O.   On: 10/28/2020 11:46     [name], DO 10/29/2020, 11:07 AM PGY-1, [place] Intern pager: [phone], text pages welcome

## 2020-10-28 NOTE — ED Notes (Signed)
Patient came in [place] wet .change clean diaper.gotten patient comfortable

## 2020-10-28 NOTE — ED Provider Notes (Addendum)
[facility] EMERGENCY DEPARTMENT Provider Note   CSN: [id number] Arrival date & time: 10/28/20  1027     History Chief Complaint  Patient presents with  . Fatigue    [name] is a 70 y.o. male.  70 y.o male with a PMH of AKI, Encephalopathy, paranoid schizophrenia, hypertension presents to the ED EMS status post altered mental status. Level 5 caveat due to dementia.   "Patient brought to ED via EMS from [facility]. Staff reports patient was found to be lethargic with abnormal gait and shaking this AM at 0730. Last seen well at 11PM last night. Patient alert on arrival to ED, incomprehensible speech. Per EMS, speech is baseline."  The history is provided by the EMS personnel, the nursing home and medical records.       Past Medical History:  Diagnosis Date  . Acute encephalopathy 04/23/2020  . Acute metabolic encephalopathy 09/21/3662  . AKI (acute kidney injury) ([place]) 05/12/2020  . BPH (benign prostatic hyperplasia)   . Closed displaced fracture of fifth metatarsal bone of right foot   . Colon polyp 2010  . Crush injury   . Depression   . GERD (gastroesophageal reflux disease)   . Hepatitis C    "caught it when I had a blood transfusion"  . High cholesterol   . History of blood transfusion    "when I was young"  . Hypertension   . Paranoid schizophrenia ([place])   . PARANOID SCHIZOPHRENIA, CHRONIC 01/17/2009  . Prostate atrophy   . Retinal vein occlusion     Patient Active Problem List   Diagnosis Date Noted  . Closed fracture of bone of right foot   . Altered mental status 07/07/2020  . Insomnia 06/21/2020  . Anxiety 06/18/2020  . Acute encephalopathy   . Cognitive impairment 06/12/2020  . AMS (altered mental status) 06/05/2020  . H/O noncompliance with medical treatment, presenting hazards to health   . DVT (deep venous thrombosis) ([place]) 06/04/2020  . Schizophrenia ([place])   . Homeless   . Dislocation of tarsometatarsal joint   . History of  hepatitis B 04/26/2020  . Chronic hepatitis ([place])   . Dementia without behavioral disturbance ([place])   . Atrial fibrillation with RVR ([place]) 04/24/2020  . Malnutrition of moderate degree 04/24/2020  . History of drug abuse ([place]) 10/18/2017  . Tobacco abuse   . Diastolic heart failure ([place]) 02/05/2015  . Healthcare maintenance 05/28/2014  . Hyperlipidemia 03/10/2014  . [place] OCCLUSION 06/03/2010  . PARANOID SCHIZOPHRENIA, CHRONIC 01/17/2009  . HYPERTENSION, BENIGN ESSENTIAL 01/17/2009  . BPH (benign prostatic hyperplasia) 01/17/2009  . History of hepatitis C 12/14/2008  . DEPRESSION 12/14/2008    Past Surgical History:  Procedure Laterality Date  . CIRCUMCISION  1959  . ORIF TOE FRACTURE Right 04/30/2020   Procedure: OPEN REDUCTION INTERNAL FIXATION (ORIF) RIGHT FOOT METATARSAL FRACTURES;  Surgeon: Newt Minion, MD;  Location: [place];  Service: Orthopedics;  Laterality: Right;  . TONSILLECTOMY         Family History  Problem Relation Age of Onset  . Hypertension Mother   . Diabetes Mother   . Stroke Mother     Social History   Tobacco Use  . Smoking status: Current Every Day Smoker    Packs/day: 0.50    Years: 48.00    Pack years: 24.00    Types: Cigarettes  . Smokeless tobacco: Never Used  Substance Use Topics  . Alcohol use: Yes  . Drug  use: No    Home Medications Prior to Admission medications   Medication Sig Start Date End Date Taking? Authorizing Provider  acetaminophen (TYLENOL) 325 MG tablet Take 2 tablets (650 mg total) by mouth every 6 (six) hours as needed for mild pain, moderate pain or headache. 05/09/20   Mullis, Kiersten P, DO  amLODipine (NORVASC) 10 MG tablet Take 1 tablet (10 mg total) by mouth daily. 07/08/20   Alcus Dad, MD  benztropine (COGENTIN) 1 MG tablet Take 1 tablet (1 mg total) by mouth 2 (two) times daily. 02/26/20   Salley Slaughter, NP  cetirizine (ZYRTEC ALLERGY) 10 MG tablet Take 1 tablet (10 mg total) by mouth  daily. 02/01/20   [name], [name], PA-C  famotidine (PEPCID) 20 MG tablet Take 1 tablet (20 mg total) by mouth 2 (two) times daily. 07/08/20   Alcus Dad, MD  fluPHENAZine decanoate (PROLIXIN) 25 MG/ML injection Inject 1 mL (25 mg total) into the muscle every 14 (fourteen) days. Last dose 07/08/2020. Next dose due 07/22/2020. 07/08/20   Alcus Dad, MD  folic acid (FOLVITE) 1 MG tablet Take 1 tablet (1 mg total) by mouth daily. 05/09/20   Mullis, Kiersten P, DO  hydrOXYzine (ATARAX/VISTARIL) 25 MG tablet Take 1 tablet (25 mg total) by mouth at bedtime as needed for anxiety (Anxiety, Insomnia). Patient not taking: Reported on 07/07/2020 06/26/20   Wilber Oliphant, MD  Multiple Vitamin (MULTIVITAMIN WITH MINERALS) TABS tablet Take 1 tablet by mouth daily. 06/28/14   Bacigalupo, Dionne Bucy, MD  nicotine (NICODERM CQ - DOSED IN MG/24 HOURS) 14 mg/24hr patch Place 14 mg onto the skin daily.    [provider]  ramelteon (ROZEREM) 8 MG tablet Take 1 tablet (8 mg total) by mouth at bedtime. Patient not taking: Reported on 07/07/2020 06/26/20   Wilber Oliphant, MD  rivaroxaban (XARELTO) 20 MG TABS tablet Take 1 tablet (20 mg total) by mouth daily with supper. Patient not taking: Reported on 07/07/2020 06/26/20   Wilber Oliphant, MD  tamsulosin (FLOMAX) 0.4 MG CAPS capsule TAKE 2 CAPSULE BY MOUTH DAILY Patient taking differently: Take 0.8 mg by mouth daily.  02/23/20   Mullis, Kiersten P, DO  thiamine 100 MG tablet Take 1 tablet (100 mg total) by mouth daily. 05/09/20   Mullis, Kiersten P, DO  traZODone (DESYREL) 50 MG tablet Take 1 tablet (50 mg total) by mouth at bedtime. Patient not taking: Reported on 07/07/2020 06/26/20   Wilber Oliphant, MD  vitamin C (ASCORBIC ACID) 500 MG tablet Take 500 mg by mouth daily.     [provider]  loratadine (CLARITIN) 10 MG tablet Take 1 tablet (10 mg total) by mouth daily. 12/28/19 02/05/20  [name], DO    Allergies    Lisinopril, Penicillins,  Amoxicillin, and Ibuprofen  Review of Systems   Review of Systems  Unable to perform ROS: Dementia    Physical Exam Updated Vital Signs BP (!) 142/77   Pulse 90   Temp (!) 100.7 F (38.2 C) (Rectal)   Resp (!) 21   Ht 5\' 9"  (1.753 m)   Wt 64 kg   SpO2 97%   BMI 20.84 kg/m   Physical Exam Vitals and nursing note reviewed.  Constitutional:      Comments: Arousal to stimuli.   HENT:     Head: Normocephalic and atraumatic.  Eyes:     Pupils: Pupils are equal, round, and reactive to light.  Cardiovascular:     Rate  and Rhythm: Normal rate.  Pulmonary:     Effort: Pulmonary effort is normal.  Abdominal:     General: Abdomen is flat.  Musculoskeletal:     Cervical back: Normal range of motion and neck supple.  Skin:    General: Skin is warm and dry.  Neurological:     Mental Status: He is disoriented.     ED Results / Procedures / Treatments   Labs (all labs ordered are listed, but only abnormal results are displayed) Labs Reviewed  CBC WITH DIFFERENTIAL/PLATELET - Abnormal; Notable for the following components:      Result Value   RBC 3.96 (*)    Hemoglobin 11.8 (*)    HCT 35.4 (*)    Platelets 147 (*)    Neutro Abs 8.1 (*)    Lymphs Abs 0.6 (*)    All other components within normal limits  COMPREHENSIVE METABOLIC PANEL - Abnormal; Notable for the following components:   Glucose, Bld 125 (*)    BUN 33 (*)    Creatinine, Ser 1.65 (*)    GFR, Estimated 45 (*)    All other components within normal limits  URINALYSIS, ROUTINE W REFLEX MICROSCOPIC - Abnormal; Notable for the following components:   APPearance HAZY (*)    Hgb urine dipstick MODERATE (*)    Protein, ur 30 (*)    Nitrite POSITIVE (*)    Leukocytes,Ua LARGE (*)    WBC, UA >50 (*)    Bacteria, UA MANY (*)    All other components within normal limits  CBG MONITORING, ED - Abnormal; Notable for the following components:   Glucose-Capillary 109 (*)    All other components within normal limits   URINE CULTURE  AMMONIA  ETHANOL    EKG EKG Interpretation  Date/Time:  Monday October 28 2020 10:32:06 EDT Ventricular Rate:  86 PR Interval:    QRS Duration: 98 QT Interval:  361 QTC Calculation: 432 R Axis:   78 Text Interpretation: Sinus rhythm Borderline T wave abnormalities Confirmed by Thamas Jaegers ([id number]) on 10/28/2020 10:36:31 AM   Radiology DG Chest 2 View  Result Date: 10/28/2020 CLINICAL DATA:  Altered mental status EXAM: CHEST - 2 VIEW COMPARISON:  May 12, 2020 FINDINGS: Lungs are clear. Heart is upper normal in size with pulmonary vascularity normal. No adenopathy. No bone lesions. IMPRESSION: Lungs clear.  Heart upper normal in size. Electronically Signed   By: Lowella Grip III M.D.   On: 10/28/2020 10:25   CT Head Wo Contrast  Result Date: 10/28/2020 CLINICAL DATA:  70 year old male with delirium EXAM: CT HEAD WITHOUT CONTRAST TECHNIQUE: Contiguous axial images were obtained from the base of the skull through the vertex without intravenous contrast. COMPARISON:  07/07/2020 FINDINGS: Brain: No acute intracranial hemorrhage. No midline shift. No mass effect. Similar degree cerebral volume loss. Similar appearance of confluent periventricular hyperdensity. Gray-white differentiation is maintained. Unremarkable appearance of the ventricles compared to the prior. Vascular: Intracranial atherosclerosis. Skull: No acute fracture.  No aggressive bone lesion identified. Sinuses/Orbits: Unremarkable appearance of the orbits. Mastoid air cells clear. No middle ear effusion. No significant sinus disease. Other: None IMPRESSION: Negative for acute intracranial abnormality. Similar appearance of advanced chronic microvascular ischemic disease with associated intracranial atherosclerosis, with associated cerebral volume loss. Electronically Signed   By: Corrie Mckusick D.O.   On: 10/28/2020 11:46    Procedures Procedures   Medications Ordered in ED Medications  acetaminophen  (TYLENOL) tablet 650 mg (650 mg Oral Given 10/28/20 1203)  ED Course  I have reviewed the triage vital signs and the nursing notes.  Pertinent labs & imaging results that were available during my care of the patient were reviewed by me and considered in my medical decision making (see chart for details).  Clinical Course as of 10/28/20 1420  Mon Oct 28, 2020  1350 Leukocytes,Ua(!): LARGE [JS]  1350 Bacteria, UA(!): MANY [JS]  1350 WBC, UA(!): >50 [JS]    Clinical Course User Index [JS] Janeece Fitting, PA-C   MDM Rules/Calculators/A&P   Patient arrives from [facility] EMS for altered mental status, worsening gait noted by nursing staff today.  Patient was last seen normal last night at 11 PM.  He arrived in the ED febrile with a temp of 100.7, given Tylenol for fever.  Oxygen saturations 97%.  He does have a prior history of dementia, unable to provide any history, he is arousable to sternal rub.  Patient arrived in the ED with 2 briefs in place, as were both soiled.  Lungs are clear to auscultation, there is no tenderness with palpation and is not arousable to palpation of his abdomen.  No bilateral leg swelling.  Patient is unable to provide any history, he is baseline per EMS, however very difficult to arouse.  Have a prior history of encephalopathy, this is a prior history of psychosis.  Interpretation of his labs by mail revealed no electrolyte abnormality, his creatinine level is elevated at 1.6, elevated from his previous.  CBC without any leukocytosis, hemoglobin is stable.  UA remarkable for large leukocytes, positive nitrites, greater than 50 white blood cell count, suspect this is likely worsening mental status along with gait.  CT head obtained to further rule out any acute injury at this time: Negative for acute intracranial abnormality.    Similar appearance of advanced chronic microvascular ischemic  disease with associated intracranial atherosclerosis, with  associated  cerebral volume loss.     DG chest showed: Lungs clear. Heart upper normal in size.  Levels within normal limits.  Ethanol normal.  Feel that patient needs admission into the hospital for further management of his UTI due to his worsening mental status.  Placed for internal medicine service.  Spoke to the IM service who will admit patient for further management.    Portions of this note were generated with [employer]. Dictation errors may occur despite best attempts at proofreading.  Final Clinical Impression(s) / ED Diagnoses Final diagnoses:  Delirium  Acute cystitis without hematuria    Rx / DC Orders ED Discharge Orders    None       Janeece Fitting, PA-C 10/28/20 1420    Janeece Fitting, PA-C 10/28/20 1421    Luna Fuse, MD 10/30/20 [phone]

## 2020-10-28 NOTE — Progress Notes (Signed)
FPTS Interim Progress Note  S:Went to examine patient to check in and get a baseline of his status. He was covered in blankets when we came into the room and was visibly with rigors. He was speaking unintelligibly and was not able to respond to any of our questions.   O: BP 130/79 (BP Location: Left Arm)   Pulse 87   Temp (!) 101.9 F (38.8 C) (Axillary)   Resp 20   Ht 5\' 9"  (1.753 m)   Wt 64 kg   SpO2 99%   BMI 20.84 kg/m   General: laying in bed with rigors, NAD CV: RRR no murmurs Resp: CTAB. Normal WOB Abdomen: soft, non distended MSK: LE 2+ pitting edema bilaterally. Dorsalis pedis pulses 2+ bilaterally  Psych: speaking unintelligibly. Unable to assess A&O  A/P: Urosepsis  UA positive for Nitrites, leukocytes. Tmax 102.8. Tachypneic to 31. Concern for acute worsening of mental status  - continue IV cefepime 1g BID. If patient worsens overnight, low threshold to broaden abx  - f/u urine cx - delirium precautions - bedrest  - am CBC, CMP   For all other conditions being treated please refer to H&P  Shary Key, DO 10/28/2020, 9:11 PM PGY-1, [place] [facility] Service pager [phone]

## 2020-10-28 NOTE — ED Notes (Signed)
Report attempted to inpatient floor x 1 

## 2020-10-28 NOTE — ED Notes (Signed)
Placed male purwrick on with a daiper and chux under him

## 2020-10-28 NOTE — H&P (Signed)
Date: 10/28/2020               Patient Name:  [name] MRN: [id number]  DOB: 1951/04/11 Age / Sex: 70 y.o., male   PCP: Danna Hefty, DO         Medical Service: Internal Medicine Teaching Service         Attending Physician: Dr. Luna Fuse, MD    First Contact: Dr. Konrad Penta Pager: [phone]  Second Contact: Dr. Marianna Payment Pager: [phone]       After Hours (After 5p/  First Contact Pager: [phone]  weekends / holidays): Second Contact Pager: [phone]   Chief Complaint: Altered Mental Status   History of Present Illness:   [name] is a 70 y.o. M w/ PMHx paranoid schizophrenia, dementia, chronic hepatitis, HFpEF, HTN, A-Fib with RVR, DVT, BPH, and insomnia who was brought to the ED by EMS from [facility] due to [place]. Per ED note, patient was found by staff to be lethargic with abnormal gait and shaking this morning at 7:30am. Last known normal was 11pm last night. Per ED, at baseline he is able to communicate and walk with a walker. Patient was able to make incomprehensible sounds although was unable to effectively communicate when assessed today.  ED Course:   On arrival, was febrile to 100.7. He was given Tylenol although fever rose to 102.9. He was very difficult to arouse although awoke to sternal rub. Both his briefs were soiled. CXR was unremarkable and CT head was normal.    History and ROS limited (level 5 caveat) due to dementia with altered mental status.  Home Meds: Tylenol 650mg  q6 PRN  Amlodipine 10mg  daily Cogentin 1mg  BID  Zyretec 10mg  daily  Pepcid 20mg  BID  Fluphenazine Deconoate 25mg  IM E95 days  Folic acid 1mg  daily Hydroxyzine 25mg  nightly PRN for anxiety Multivitamin 1 tablet daily  Nicoderm 14mg  patch daily  Ramelteon 8mg  nightly  Xarelto 20mg  nightly  Tamsulosin 0.8mg  daily Thiamine 100mg  daily  Trazodone 50mg  nightly Vitamin C 500mg  daily   Allergies: Allergies as of 10/28/2020 - Review Complete 07/07/2020  Allergen Reaction Noted  .  Lisinopril Other (See Comments) 08/13/2014  . Penicillins Hives 12/14/2008  . Amoxicillin Rash 11/09/2017  . Ibuprofen Rash 11/09/2017   Past Medical History:  Diagnosis Date  . Acute encephalopathy 04/23/2020  . Acute metabolic encephalopathy 09/24/4130  . AKI (acute kidney injury) ([place]) 05/12/2020  . BPH (benign prostatic hyperplasia)   . Closed displaced fracture of fifth metatarsal bone of right foot   . Colon polyp 2010  . Crush injury   . Depression   . GERD (gastroesophageal reflux disease)   . Hepatitis C    "caught it when I had a blood transfusion"  . High cholesterol   . History of blood transfusion    "when I was young"  . Hypertension   . Paranoid schizophrenia ([place])   . PARANOID SCHIZOPHRENIA, CHRONIC 01/17/2009  . Prostate atrophy   . Retinal vein occlusion     Family History:  Family History  Problem Relation Age of Onset  . Hypertension Mother   . Diabetes Mother   . Stroke Mother    Social History:  Per last Epic History review in 2021, patient drank alcohol, has a 48 pack year smoking history, and does not use other illicit drugs.   Review of Systems: A complete ROS was negative except as per HPI.   Physical Exam: Blood pressure (!) 142/77, pulse  90, temperature (!) 100.7 F (38.2 C), temperature source Rectal, resp. rate (!) 21, height 5\' 9"  (1.753 m), weight 64 kg, SpO2 97 %.  General: Patient is toxic appearing with active rigors, in mild acute distress. Eyes: Mild scleral icterus present bilaterally. No conjunctival injection.  HENT: Dry mucus membranes. No nasal discharge. Head atraumatic. Respiratory: Lung sounds decreased bilaterally but anteriorly are CTA, bilaterally without wheezes, rales, or rhonchi.  Cardiovascular: Rate is borderline tachycardic. Rhythm is regular. There is a 2/6 SEM heard best in the upper right chest. No other murmurs, rubs, or gallops. There is 1+ RLE pitting edema and trace LLE pitting edema. Distal pulses are 3+ in all  four extremities.  Abdominal: Soft and non-distended without appreciable tenderness. Bowel sounds intact. No rebound or guarding. Neurological: Lethargic, awakens to sternal rub and shouting. Dyskinetic chewing motions of the mouth present. Reflexes are diffusely hyper-reflexic without clonus. There is no obvious facial asymmetry.  Musculoskeletal: There is increased muscle tone diffusely, UE > LE's bilaterally. Mild sarcopenia.  Skin: No lesions. No rashes. Skin is diaphoretic although with skin tenting present.    EKG: personally reviewed my interpretation is NSR at 86 bpm. Diffuse borderline T wave inversions. No significant consecutive ST segment changes to suggest acute ischemia. Normal PR and QTc intervals.   CXR: personally reviewed my interpretation is no acute cardiopulmonary disease. Unremarkable CXR.  Assessment & Plan by Problem: Active Problems:   * No active hospital problems. *  Cr 1.65, BUN 33, GFR 45 Glucose 125 Ammonia 11 Hgb 11.8 Plt 147 Nl WBC but increased neutrophil  U/A large leuk, pos nitrite, many bact, >50, mod hgb   # Acute Toxic Encephalopathy  Patient initially febrile with borderline tachycardia, although otherwise HDS. On exam, patient has active rigors with diaphoresis, skin tenting, increased muscle tone, hyper-reflexia, and hyper-reflexia. Urinalysis positive for nitrites, leukocyte esterase with many bacteria and >50 WBC's. Concerning for UTI. CXR unremarkable. WBC is WNL although neutrophil count is slightly elevated. Recent urine cultures with multiple species although did have prior culture with MSSA resistant only to penicillin. Patient takes multiple serotonergic and dopaminergic medications, concerning for serotonin syndrome vs. NMS. CT scan shows advanced chronic microvascular ischemic disease with atherosclerosis and cerebral volume loss, although without acute findings to suggest structural lesion. Initial glucose was 125 and electrolytes, ammonia,  and liver function / T. Bili normal, making metabolic cause unlikely.  - Will hold home centrally acting medications  - Check COVID-19 PCR  - Check urine culture  - Check blood cultures x 2  - Check lactic acid, TSH, magnesium, phosphorus, ionized calcium  - Check PT, APTT - Give thiamine 100mg , folic acid 1mg  and multivitamin daily   # AKI  Creatinine 1.65, BUN 33, GFR 45. Baseline creatinine ~ 1.22 with GFR >60. Urinalysis shows moderate Hgb and no RBC's, concerning for rhabdomyolysis. May also be pre-renal in the setting of fever and diaphoresis given exam findings above.  - Check CK - Will give 1L LR bolus  - Continue to trend renal function   # Dementia  At baseline, patient is able to speak and walks with a walker.  - Will hold cogentin in setting of possible medication-induced fever/encephalopathy   # Chronic Normocytic Anemia  # Chronic Thrombocytopenia  Hgb 11.8, plt 147k, which seems to be patient's baseline.  - Check pathologist's smear  - Continue to monitor CBC   # HTN Blood pressure is variable although persistently hypertensive. May be in setting of acute illness.  -  Hold home amlodipine 10mg  daily for now - Continue to monitor   # BPH Patient takes tamsulosin 0.8mg  daily at facility, although has had incontinence here.  - Will hold tamsulosin acutely  - Consider restarting vs. Obtaining bladder scans if concerns for urine retention   # History of A-Fib  Patient has remained in normal sinus rhythm.  - Continue home Xarelto 20mg  daily  # TUD - Consider placing nicotine patch as needed  Code status: Full Code  Diet: NPO DVT PPx: On Xarelto  IVF: 1L LR bolus   Dispo: Admit patient to Inpatient with expected length of stay greater than 2 midnights.   Signed: Jeralyn Bennett, MD 10/28/2020, 1:58 PM  Pager: [phone] After 5pm on weekdays and 1pm on weekends: On Call pager: [phone]

## 2020-10-28 NOTE — ED Triage Notes (Signed)
Patient brought to ED via EMS from [facility]. Staff reports patient was found to be lethargic with abnormal gait and shaking this AM at 0730. Last seen well at 11PM last night. Patient alert on arrival to ED, incomprehensible speech. Per EMS, speech is baseline.   EMS v/s: CBG 189 155/94 84 pulse NSR 18 RR 99% room air

## 2020-10-28 NOTE — Hospital Course (Addendum)
[name] is a 70 y.o. male presenting with altered mental status and found to have a UTI as well. PMH is significant for primary schizophrenia, HFpEF, HTN, HLD, BPH, tobacco, abuse, GERD, hx of Hep C s/p [place], hx of Hep B cleared.    ESBL bacteremia Sepsis 2/2 UTI, improving Patient was admitted to the hospital due altered mental status, tachycardia, fever, and some tachypnea and was found to have a UA with nitrites, large leukocytes, and many bacteria. Patient met sepsis criteria and blood and urine cultures were collected. Patient was initially started on cefepime for 1 day until blood cultures resulted with ESBL and patient was transitioned to meropenem and was further narrowed to bactrim based on sensitivities after 48 hours of appropriate IV antibiotics. Patient was continued on bactrim on discharge to finish out a total of 7 antibiotic days from initiation of meropenem..   Acute encephalopathy  Schizophrenia  Patient was admitted with altered mental status from baseline. Home medications were held upon admission and restarted upon discharge to SNF.  AKI on CKD, stage IIIa Patient admitted with elevated creatinine of 1.65 and was given a 1L LR in the ED with some improvement. Likely prerenal secondary to dehydration in the setting of illness and recent fevers. Patient was started on IVF with subsequent improvement of creatinine to 1.32 prior to discharge.   Thrombocytopenia Patient had thrombocytopenia to 88 during admission and was 151 upon discharge. This is a condition that has occurred during previous admissions as well.    All other chronic conditions were stable and treated with home medications as appropriate.    Issues for follow-up: Patient administered fluphenazine on 3/16, will need next dose on 3/30. Antibiotic of Bactrim to be given and stop on March 21st. Recommend re-checking BMP within the next 7 days to monitor creatinine, was 1.32 upon discharge.  

## 2020-10-29 DIAGNOSIS — N3000 Acute cystitis without hematuria: Secondary | ICD-10-CM

## 2020-10-29 DIAGNOSIS — A4151 Sepsis due to Escherichia coli [E. coli]: Secondary | ICD-10-CM | POA: Diagnosis not present

## 2020-10-29 DIAGNOSIS — R410 Disorientation, unspecified: Secondary | ICD-10-CM | POA: Diagnosis not present

## 2020-10-29 LAB — CBC
HCT: 32.3 % — ABNORMAL LOW (ref 39.0–52.0)
Hemoglobin: 11.1 g/dL — ABNORMAL LOW (ref 13.0–17.0)
MCH: 29.8 pg (ref 26.0–34.0)
MCHC: 34.4 g/dL (ref 30.0–36.0)
MCV: 86.6 fL (ref 80.0–100.0)
Platelets: 88 10*3/uL — ABNORMAL LOW (ref 150–400)
RBC: 3.73 MIL/uL — ABNORMAL LOW (ref 4.22–5.81)
RDW: 14.2 % (ref 11.5–15.5)
WBC: 10.4 10*3/uL (ref 4.0–10.5)
nRBC: 0 % (ref 0.0–0.2)

## 2020-10-29 LAB — BLOOD CULTURE ID PANEL (REFLEXED) - BCID2

## 2020-10-29 LAB — COMPREHENSIVE METABOLIC PANEL
ALT: 33 U/L (ref 0–44)
AST: 43 U/L — ABNORMAL HIGH (ref 15–41)
Albumin: 2.9 g/dL — ABNORMAL LOW (ref 3.5–5.0)
Alkaline Phosphatase: 42 U/L (ref 38–126)
Anion gap: 7 (ref 5–15)
BUN: 24 mg/dL — ABNORMAL HIGH (ref 8–23)
CO2: 24 mmol/L (ref 22–32)
Calcium: 8.5 mg/dL — ABNORMAL LOW (ref 8.9–10.3)
Chloride: 111 mmol/L (ref 98–111)
Creatinine, Ser: 1.58 mg/dL — ABNORMAL HIGH (ref 0.61–1.24)
GFR, Estimated: 47 mL/min — ABNORMAL LOW (ref >60)
Glucose, Bld: 113 mg/dL — ABNORMAL HIGH (ref 70–99)
Potassium: 3.7 mmol/L (ref 3.5–5.1)
Sodium: 142 mmol/L (ref 135–145)
Total Bilirubin: 1.1 mg/dL (ref 0.3–1.2)
Total Protein: 6.3 g/dL — ABNORMAL LOW (ref 6.5–8.1)

## 2020-10-29 LAB — GLUCOSE, CAPILLARY: Glucose-Capillary: 88 mg/dL (ref 70–99)

## 2020-10-29 LAB — CALCIUM, IONIZED: Calcium, Ionized, Serum: 4.9 mg/dL (ref 4.5–5.6)

## 2020-10-29 MED ORDER — FOLIC ACID 1 MG PO TABS
1.0000 mg | ORAL_TABLET | Freq: Every day | ORAL | Status: DC
  Administered 2020-10-30 – 2020-11-01 (×3): 1 mg via ORAL
  Filled 2020-10-29 (×3): qty 1

## 2020-10-29 MED ORDER — ENOXAPARIN SODIUM 40 MG/0.4ML ~~LOC~~ SOLN
40.0000 mg | SUBCUTANEOUS | Status: DC
  Administered 2020-10-29 – 2020-10-31 (×3): 40 mg via SUBCUTANEOUS
  Filled 2020-10-29 (×3): qty 0.4

## 2020-10-29 MED ORDER — FLUPHENAZINE DECANOATE 25 MG/ML IJ SOLN
25.0000 mg | INTRAMUSCULAR | Status: DC
  Administered 2020-10-30: 25 mg via INTRAMUSCULAR
  Filled 2020-10-29: qty 1

## 2020-10-29 MED ORDER — SODIUM CHLORIDE 0.9 % IV SOLN: INTRAVENOUS | Status: AC

## 2020-10-29 MED ORDER — SODIUM CHLORIDE 0.9 % IV SOLN
1.0000 g | Freq: Two times a day (BID) | INTRAVENOUS | Status: DC
  Administered 2020-10-29 – 2020-10-31 (×5): 1 g via INTRAVENOUS
  Filled 2020-10-29 (×6): qty 1

## 2020-10-29 MED ORDER — THIAMINE HCL 100 MG PO TABS
100.0000 mg | ORAL_TABLET | Freq: Every day | ORAL | Status: DC
  Administered 2020-10-30 – 2020-11-01 (×3): 100 mg via ORAL
  Filled 2020-10-29 (×3): qty 1

## 2020-10-29 MED ORDER — SODIUM CHLORIDE 0.9 % IV SOLN
2.0000 g | Freq: Two times a day (BID) | INTRAVENOUS | Status: DC
  Filled 2020-10-29 (×2): qty 2

## 2020-10-29 NOTE — Progress Notes (Signed)
Spoke with patient's nursing team at [facility]. Patient was due to receive prolixin on 3/14, will administer today in hospital. He has not been on xarelto since it was discontinued in December 2021.  Gladys Damme, MD [facility], PGY-2

## 2020-10-29 NOTE — Progress Notes (Signed)
Family Medicine Teaching Service Daily Progress Note Intern Pager: [phone]  Patient name: [name] Medical record number: [id number] Date of birth: [birth year] Age: 70 y.o. Gender: male  Primary Care Provider: Danna Hefty, DO Consultants: None Code Status: Full  Pt Overview and Major Events to Date:  3/14 - Admitted 3/15 - ESBL on blood culture, Meropenem started  Assessment and Plan: [name] a 70 y.o.malepresenting with altered mental status and found to have a UTI as well. PMH is significant forprimary schizophrenia, HFpEF, HTN, HLD, BPH, tobacco, abuse, GERD, hx of Hep C s/p Harvoni, hx of Hep B cleared.  ESBL bacteremia Sepsis 2/2 UTI, improving VSS. Cefepime (3/14), currently on Meropenem (3/15-) for 10 day treatment of complicated UTI and bacteremia. Awaiting sensitivities to de-escalate to oral option if able. Patient's last documented fever was 3/15 at 1500, which was less than 24h after initiation of meropenem, will continue to monitor. - Meropenem per pharmacy - Monitor for further fevers - Continue IVF - Bedrest  - Delirium precautions - F/u blood and urine cultures  Acute encephalopathy  Schizophrenia :  improving Patient is much improved today, he was able to state that he was feeling fine today and was participating in eating breakfast during exam. - Fluphenazine given 3/16 at 1am (was due 3/14 per SNF report) - Continue thiamine and folic acid - Holding trazodone and hydroxyzine, can add back if agitation present - PT/OT  AKI on CKD Stage IIIa Cr 1.65>1.4>1.58>1.64. Baseline 1.22.  - Monitor with BMP - Continue IVF  Thrombocytopenia Platelets [phone], similar to prior admissions. Patient appears to have several measurements that indicate a chronic platelet count between 100-200.  - Continue to monitor  HTN: chronic, stable BP since admission 106-129/70-79 - Continue home amlodipine - Continue to monitor BP  BPH, chronic,  stable - Continue home tamsulosin    FEN/GI: Regular diet (finger foods) PPx: Lovenox    Status is: Inpatient  Remains inpatient appropriate because:Ongoing diagnostic testing needed not appropriate for outpatient work up and IV treatments appropriate due to intensity of illness or inability to take PO   Dispo: The patient is from: SNF              Anticipated d/c is to: SNF              Patient currently is not medically stable to d/c.   Difficult to place patient No    Subjective:  Patient reports that he is doing fine with no complaints. Nursing reported right before exam he was trying to pull off his lines and his IV out.   Objective: Temp:  [98.5 F (36.9 C)-101 F (38.3 C)] 98.5 F (36.9 C) (03/16 0835) Pulse Rate:  [66-85] 66 (03/16 0835) Resp:  [18-20] 20 (03/16 0835) BP: (106-129)/(70-79) 119/72 (03/16 0835) SpO2:  [94 %-100 %] 94 % (03/16 0835) Physical Exam: General: NAD, sitting up in bed on left side being fed breakfast by NT, still tremulous on exam, but somewhat improved from prior exams (hx of tardive dyskinesia as well) Cardiovascular: RRR, no m/r/g appreicated Respiratory: CTAB, comfortable on room air, no increased WOB Abdomen: soft, non-tender, non-distended Extremities: no swelling or erythema noted in BLE, using arms to help feed himself and during interactions, waves good bye when providers exit room.  Laboratory: Recent Labs  Lab 10/28/20 0947 10/29/20 0329 10/30/20 0313  WBC 9.7 10.4 10.0  HGB 11.8* 11.1* 10.9*  HCT 35.4* 32.3* 32.0*  PLT 147* 88* 123*  Recent Labs  Lab 10/28/20 1658 10/29/20 0329 10/30/20 0313  NA 142 142 142  K 3.5 3.7 3.8  CL 109 111 110  CO2 24 24 26   BUN 26* 24* 28*  CREATININE 1.40* 1.58* 1.64*  CALCIUM 8.5* 8.5* 8.3*  PROT 6.6 6.3* 5.9*  BILITOT 1.2 1.1 1.0  ALKPHOS 42 42 47  ALT 32 33 29  AST 39 43* 32  GLUCOSE 123* 113* 118*     Imaging/Diagnostic Tests: No results found.   [name], DO 10/30/2020, 9:42 AM PGY-1, [place] Intern pager: [phone], text pages welcome

## 2020-10-29 NOTE — Evaluation (Signed)
Physical Therapy Evaluation Patient Details Name: [name] MRN: [id number] DOB: [birth year] Today's Date: 10/29/2020   History of Present Illness  70 y.o. M w/ PMHx paranoid schizophrenia, dementia, chronic hepatitis, HFpEF, HTN, A-Fib with RVR, DVT, BPH, and insomnia who was brought to the ED 10/28/20 by EMS from [facility] due to AMS. Per ED, at baseline he is able to communicate and walk with a walker. Acute encephalopathy, AKI, urosepsis  Clinical Impression   Pt admitted with above diagnosis. PTA patient was previously walking with walker at [facility] (per ED notes). Pt currently requires min assist +1 for safety due to decr cognition and lines. Patient with the following functional limitations due to the deficits listed below: impaired balance and mobility. Pt will benefit from skilled PT to increase their independence and safety with mobility to allow discharge to the venue listed below.       Follow Up Recommendations SNF;Supervision/Assistance - 24 hour (24/7 due to decr cognition; ?long-term care if back to baseline (not yet there))    Equipment Recommendations  Rolling walker with 5" wheels    Recommendations for Other Services       Precautions / Restrictions Precautions Precautions: Fall      Mobility  Bed Mobility Overal bed mobility: Needs Assistance Bed Mobility: Supine to Sit     Supine to sit: Min assist     General bed mobility comments: pt able to come to sitting EOB with minguard however min assist to scoot to get feet on the floor    Transfers Overall transfer level: Needs assistance Equipment used: Rolling walker (2 wheeled) Transfers: Sit to/from Stand Sit to Stand: Min assist         General transfer comment: pt trying to hold onto foley catheter and IV tubing and would not release unless given something else to hold. Both hands placed on RW for sit to stand with PT anchoring RW to stabilize.  Ambulation/Gait Ambulation/Gait  assistance: Min assist Gait Distance (Feet): 2 Feet Assistive device: Rolling walker (2 wheeled) Gait Pattern/deviations: Step-to pattern;Shuffle Gait velocity: decr   General Gait Details: unsafe to attempt further ambulation due to IV and pt's cognitive status (unsure he would turn around when time); appears could ambulate more if +2 for safety or without IV pole  Stairs            Wheelchair Mobility    Modified Rankin (Stroke Patients Only)       Balance Overall balance assessment: Needs assistance Sitting-balance support: No upper extremity supported;Feet unsupported Sitting balance-Leahy Scale: Fair     Standing balance support: Bilateral upper extremity supported Standing balance-Leahy Scale: Poor                               Pertinent Vitals/Pain Pain Assessment: Faces Faces Pain Scale: No hurt    Home Living Family/patient expects to be discharged to:: Other (Comment)                 Additional Comments: from [facility]; admitted 12/03--may need long-term care bed at this time    Prior Function Level of Independence: Needs assistance   Gait / Transfers Assistance Needed: ambulatory with RW per ED notes           Hand Dominance        Extremity/Trunk Assessment   Upper Extremity Assessment Upper Extremity Assessment: Defer to OT evaluation    Lower  Extremity Assessment Lower Extremity Assessment: Generalized weakness    Cervical / Trunk Assessment Cervical / Trunk Assessment: Normal  Communication   Communication: Expressive difficulties  Cognition Arousal/Alertness: Awake/alert Behavior During Therapy: Flat affect Overall Cognitive Status: No family/caregiver present to determine baseline cognitive functioning                                 General Comments: known cognitive deficits PTA, however unsure if at baseline      General Comments General comments (skin integrity, edema, etc.): end  of session seated in chair and pt again found foley and IV tubing to hold onto--replaced each with small bottle of shampoo and tubing put out of sight to reduce risk of grabbing/pulling    Exercises     Assessment/Plan    PT Assessment Patient needs continued PT services  PT Problem List Decreased strength;Decreased balance;Decreased mobility;Decreased cognition;Decreased knowledge of use of DME;Decreased safety awareness       PT Treatment Interventions DME instruction;Gait training;Functional mobility training;Therapeutic activities;Therapeutic exercise;Balance training;Cognitive remediation;Patient/family education    PT Goals (Current goals can be found in the Care Plan section)  Acute Rehab PT Goals Patient Stated Goal: unable to state; verbalizations very garbled PT Goal Formulation: Patient unable to participate in goal setting Time For Goal Achievement: 11/12/20 Potential to Achieve Goals: Fair    Frequency Min 2X/week   Barriers to discharge Decreased caregiver support      Co-evaluation               AM-PAC PT "6 Clicks" Mobility  Outcome Measure Help needed turning from your back to your side while in a flat bed without using bedrails?: A Little Help needed moving from lying on your back to sitting on the side of a flat bed without using bedrails?: A Little Help needed moving to and from a bed to a chair (including a wheelchair)?: A Little Help needed standing up from a chair using your arms (e.g., wheelchair or bedside chair)?: A Little Help needed to walk in hospital room?: Total Help needed climbing 3-5 steps with a railing? : Total 6 Click Score: 14    End of Session Equipment Utilized During Treatment: Gait belt Activity Tolerance: Patient tolerated treatment well Patient left: in chair;with call bell/phone within reach;with chair alarm set Nurse Communication: Mobility status PT Visit Diagnosis: Unsteadiness on feet (R26.81);Muscle weakness  (generalized) (M62.81)    Time: 1610-9604 PT Time Calculation (min) (ACUTE ONLY): 27 min   Charges:   PT Evaluation $PT Eval Low Complexity: 1 Low PT Treatments $Therapeutic Activity: 8-22 mins         Arby Barrette, PT Pager [phone]   Rexanne Mano 10/29/2020, 10:54 AM

## 2020-10-29 NOTE — Evaluation (Signed)
Clinical/Bedside Swallow Evaluation Patient Details  Name: [name] MRN: [id number] Date of Birth: [birth year]  Today's Date: 10/29/2020 Time: SLP Start Time (ACUTE ONLY): 0945 SLP Stop Time (ACUTE ONLY): 0954 SLP Time Calculation (min) (ACUTE ONLY): 9 min  Past Medical History:  Past Medical History:  Diagnosis Date  . Acute encephalopathy 04/23/2020  . Acute metabolic encephalopathy 10/20/1441  . AKI (acute kidney injury) ([place]) 05/12/2020  . BPH (benign prostatic hyperplasia)   . Closed displaced fracture of fifth metatarsal bone of right foot   . Colon polyp 2010  . Crush injury   . Depression   . GERD (gastroesophageal reflux disease)   . Hepatitis C    "caught it when I had a blood transfusion"  . High cholesterol   . History of blood transfusion    "when I was young"  . Hypertension   . Paranoid schizophrenia ([place])   . PARANOID SCHIZOPHRENIA, CHRONIC 01/17/2009  . Prostate atrophy   . Retinal vein occlusion    Past Surgical History:  Past Surgical History:  Procedure Laterality Date  . CIRCUMCISION  1959  . ORIF TOE FRACTURE Right 04/30/2020   Procedure: OPEN REDUCTION INTERNAL FIXATION (ORIF) RIGHT FOOT METATARSAL FRACTURES;  Surgeon: Newt Minion, MD;  Location: [facility];  Service: Orthopedics;  Laterality: Right;  . TONSILLECTOMY     HPI:  [name] is a 70 y.o. M w/ PMHx paranoid schizophrenia, dementia, chronic hepatitis, HFpEF, HTN, A-Fib with RVR, DVT, BPH, and insomnia who was brought to the ED by EMS from [facility] due to AMS. Per ED note, patient was found by staff to be lethargic with abnormal gait and shaking this morning at 7:30am. Per ED, at baseline he is able to communicate and walk with a walker.Concern for UTI. In prior SLP assessment in 2021, pt reportedly aphasic at baseline. Observed to be impulsive with meal, but no signs of dysphagia.   Assessment / Plan / Recommendation Clinical Impression  Pt demonstrates impulsivity with PO, but no signs of  dysphagia despite missing dentition. Pt will need set up assist with meal. Can self feed with a spoon if given initial hand over hand cues. [name] will do best with finger foods. No SLP f/u needed will sign off. SLP Visit Diagnosis: Dysphagia, unspecified (R13.10)    Aspiration Risk  Mild aspiration risk    Diet Recommendation Regular;Thin liquid   Liquid Administration via: Cup;Straw Medication Administration: Whole meds with liquid Supervision: Patient able to self feed Postural Changes: Seated upright at 90 degrees    Other  Recommendations Oral Care Recommendations: Oral care BID   Follow up Recommendations        Frequency and Duration            Prognosis        Swallow Study   General HPI: [name] is a 70 y.o. M w/ PMHx paranoid schizophrenia, dementia, chronic hepatitis, HFpEF, HTN, A-Fib with RVR, DVT, BPH, and insomnia who was brought to the ED by EMS from [facility] due to AMS. Per ED note, patient was found by staff to be lethargic with abnormal gait and shaking this morning at 7:30am. Per ED, at baseline he is able to communicate and walk with a walker.Concern for UTI. In prior SLP assessment in 2021, pt reportedly aphasic at baseline. Observed to be impulsive with meal, but no signs of dysphagia. Type of Study: Bedside Swallow Evaluation Previous Swallow Assessment: see HPI Diet Prior to this Study: NPO Temperature  Spikes Noted: No Respiratory Status: Room air History of Recent Intubation: No Behavior/Cognition: Alert;Cooperative;Pleasant mood Oral Cavity Assessment: Dry Oral Care Completed by SLP: No Oral Cavity - Dentition: Poor condition;Missing dentition Vision: Functional for self-feeding Self-Feeding Abilities: Able to feed self Patient Positioning: Upright in bed Baseline Vocal Quality: Normal Volitional Cough: Cognitively unable to elicit Volitional Swallow: Unable to elicit    Oral/Motor/Sensory Function Overall Oral Motor/Sensory Function:  Within functional limits   Ice Chips     Thin Liquid Thin Liquid: Within functional limits Presentation: Self Fed;Straw    Nectar Thick Nectar Thick Liquid: Not tested   Honey Thick Honey Thick Liquid: Not tested   Puree Puree: Within functional limits Presentation: Spoon   Solid     Solid: Within functional limits Presentation: Self Fed      [name], Katherene Ponto 10/29/2020,9:56 AM

## 2020-10-29 NOTE — Progress Notes (Signed)
CSW spoke with Amada Jupiter at [facility] who confirmed that pt can return to facility at discharge. Lurline Idol, MSW, LCSW 3/15/20223:45 PM

## 2020-10-29 NOTE — Progress Notes (Signed)
PHARMACY - PHYSICIAN COMMUNICATION CRITICAL VALUE ALERT - BLOOD CULTURE IDENTIFICATION (BCID)  [name] is an 70 y.o. male who presented to [facility] on 10/28/2020 with a chief complaint of AMS.   Assessment: Blood cultures now growing GNR in 3/4 bottles. BCID reporting E. Coli with CTX-M ESBL resistance detected. Patient febrile (Tm 102.9) overnight, now afebrile. WBC 10.4.   Name of physician (or Provider) Contacted: [name], DO  Current antibiotics: Cefepime 2g IV q12h  Changes to prescribed antibiotics recommended:  Change cefepime to meropenem 1g IV q12h   Results for orders placed or performed during the hospital encounter of 10/28/20  Blood Culture ID Panel (Reflexed) (Collected: 10/28/2020  3:13 PM)  Result Value Ref Range   Enterococcus faecalis NOT DETECTED NOT DETECTED   Enterococcus Faecium NOT DETECTED NOT DETECTED   Listeria monocytogenes NOT DETECTED NOT DETECTED   Staphylococcus species NOT DETECTED NOT DETECTED   Staphylococcus aureus (BCID) NOT DETECTED NOT DETECTED   Staphylococcus epidermidis NOT DETECTED NOT DETECTED   Staphylococcus lugdunensis NOT DETECTED NOT DETECTED   Streptococcus species NOT DETECTED NOT DETECTED   Streptococcus agalactiae NOT DETECTED NOT DETECTED   Streptococcus pneumoniae NOT DETECTED NOT DETECTED   Streptococcus pyogenes NOT DETECTED NOT DETECTED   A.calcoaceticus-baumannii NOT DETECTED NOT DETECTED   Bacteroides fragilis NOT DETECTED NOT DETECTED   Enterobacterales DETECTED (A) NOT DETECTED   Enterobacter cloacae complex NOT DETECTED NOT DETECTED   Escherichia coli DETECTED (A) NOT DETECTED   Klebsiella aerogenes NOT DETECTED NOT DETECTED   Klebsiella oxytoca NOT DETECTED NOT DETECTED   Klebsiella pneumoniae NOT DETECTED NOT DETECTED   Proteus species NOT DETECTED NOT DETECTED   Salmonella species NOT DETECTED NOT DETECTED   Serratia marcescens NOT DETECTED NOT DETECTED   Haemophilus influenzae NOT DETECTED NOT  DETECTED   Neisseria meningitidis NOT DETECTED NOT DETECTED   Pseudomonas aeruginosa NOT DETECTED NOT DETECTED   Stenotrophomonas maltophilia NOT DETECTED NOT DETECTED   Candida albicans NOT DETECTED NOT DETECTED   Candida auris NOT DETECTED NOT DETECTED   Candida glabrata NOT DETECTED NOT DETECTED   Candida krusei NOT DETECTED NOT DETECTED   Candida parapsilosis NOT DETECTED NOT DETECTED   Candida tropicalis NOT DETECTED NOT DETECTED   Cryptococcus neoformans/gattii NOT DETECTED NOT DETECTED   CTX-M ESBL DETECTED (A) NOT DETECTED   Carbapenem resistance IMP NOT DETECTED NOT DETECTED   Carbapenem resistance KPC NOT DETECTED NOT DETECTED   Carbapenem resistance NDM NOT DETECTED NOT DETECTED   Carbapenem resist OXA 48 LIKE NOT DETECTED NOT DETECTED   Carbapenem resistance VIM NOT DETECTED NOT DETECTED    Claudina Lick, PharmD PGY1 Acute Care Pharmacy Resident 10/29/2020 9:06 AM  Please check [email or web address] for unit-specific pharmacy phone numbers.

## 2020-10-29 NOTE — Evaluation (Signed)
Occupational Therapy Evaluation Patient Details Name: [name] MRN: [id number] DOB: [birth year] Today's Date: 10/29/2020    History of Present Illness 70 y.o. M w/ PMHx paranoid schizophrenia, dementia, chronic hepatitis, HFpEF, HTN, A-Fib with RVR, DVT, BPH, and insomnia who was brought to the ED 10/28/20 by EMS from [facility] due to AMS. Per ED, at baseline he is able to communicate and walk with a walker. Acute encephalopathy, AKI, urosepsis   Clinical Impression   Pt PTA: Pt from SNF and likely assisted with ADL and mobility with no AD. Pt currently, minA to maxA for ADL and minguardA to minA for mobility. Pt murmurs, but does not appears to say much intelligibly. Pt following some commands, but requires multimodal cues. Pt would benefit from continued OT skilled services for ADL, mobility and safety. OT following acutely.     Follow Up Recommendations  SNF;Supervision/Assistance - 24 hour (return to SNF (for LTC?) psot acute acute care with 24/7 supervision)    Equipment Recommendations  None recommended by OT    Recommendations for Other Services       Precautions / Restrictions Precautions Precautions: Fall Restrictions Weight Bearing Restrictions: No      Mobility Bed Mobility Overal bed mobility: Needs Assistance Bed Mobility: Supine to Sit;Sit to Supine     Supine to sit: Min assist Sit to supine: Min guard   General bed mobility comments: cues to scoot to EOB and cues to scoot to [facility]    Transfers Overall transfer level: Needs assistance Equipment used: Rolling walker (2 wheeled) Transfers: Sit to/from Stand Sit to Stand: Min assist         General transfer comment: pt trying to hold onto foley catheter and IV tubing and would not release unless given something else to hold. Both hands placed on RW for sit to stand with PT anchoring RW to stabilize.    Balance Overall balance assessment: Needs assistance Sitting-balance support: No upper extremity  supported;Feet unsupported Sitting balance-Leahy Scale: Fair     Standing balance support: Bilateral upper extremity supported Standing balance-Leahy Scale: Poor                             ADL either performed or assessed with clinical judgement   ADL Overall ADL's : Needs assistance/impaired Eating/Feeding: Minimal assistance;Sitting   Grooming: Minimal assistance;Sitting Grooming Details (indicate cue type and reason): Pt not following command to stop at sink for grooming. Upper Body Bathing: Minimal assistance;Sitting   Lower Body Bathing: Maximal assistance;Sitting/lateral leans;Sit to/from stand;Cueing for safety;Cueing for sequencing   Upper Body Dressing : Minimal assistance;Sitting;Cueing for safety   Lower Body Dressing: Maximal assistance;Cueing for safety;Cueing for sequencing;Sitting/lateral leans;Sit to/from stand   Toilet Transfer: Minimal assistance;Ambulation;Stand-pivot;Cueing for safety;RW   Toileting- Clothing Manipulation and Hygiene: Maximal assistance;Cueing for safety;Sitting/lateral lean;Sit to/from stand       Functional mobility during ADLs: Minimal assistance;Cueing for safety;Cueing for sequencing General ADL Comments: Pt limited by decreased strength, decreased ability to care for self and decreased cognition to follow commands.     Vision Baseline Vision/History: Wears glasses Wears Glasses: Reading only Patient Visual Report: No change from baseline Vision Assessment?: No apparent visual deficits Additional Comments: continue to assess; pt not able to focus long enough for scanning task     Perception     Praxis      Pertinent Vitals/Pain Pain Assessment: Faces Faces Pain Scale: No hurt Pain Intervention(s): Monitored during session;Repositioned  Hand Dominance Right   Extremity/Trunk Assessment Upper Extremity Assessment Upper Extremity Assessment: Generalized weakness;RUE deficits/detail;LUE deficits/detail RUE  Deficits / Details: AROM, WFLs; good grip strength grossly LUE Deficits / Details: AROM, WFLs; good grip strength grossly   Lower Extremity Assessment Lower Extremity Assessment: Generalized weakness   Cervical / Trunk Assessment Cervical / Trunk Assessment: Normal   Communication Communication Communication: Expressive difficulties   Cognition Arousal/Alertness: Awake/alert Behavior During Therapy: Flat affect Overall Cognitive Status: No family/caregiver present to determine baseline cognitive functioning                                 General Comments: known cognitive deficits PTA, however unsure if at baseline. pt mumbling in response to name, but unable to state anything legibile at this time. pt requires commands repeated often   General Comments  end of session seated in chair and pt again found foley and IV tubing to hold onto--replaced each with small bottle of shampoo and tubing put out of sight to reduce risk of grabbing/pulling    Exercises     Shoulder Instructions      Home Living Family/patient expects to be discharged to:: Other (Comment)                                 Additional Comments: from [facility]; admitted 12/03--may need long-term care bed at this time      Prior Functioning/Environment Level of Independence: Needs assistance  Gait / Transfers Assistance Needed: ambulatory with RW per ED notes ADL's / Homemaking Assistance Needed: Pt unable to state; assume pt was assisted, supervisionA at least            OT Problem List: Decreased strength;Decreased activity tolerance;Impaired balance (sitting and/or standing);Decreased safety awareness      OT Treatment/Interventions: Self-care/ADL training;Therapeutic exercise;Energy conservation;DME and/or AE instruction;Therapeutic activities;Cognitive remediation/compensation;Patient/family education;Balance training    OT Goals(Current goals can be found in the care  plan section) Acute Rehab OT Goals Patient Stated Goal: unable to state; verbalizations very garbled OT Goal Formulation: Patient unable to participate in goal setting Time For Goal Achievement: 11/12/20 Potential to Achieve Goals: Fair  OT Frequency: Min 2X/week   Barriers to D/C:            Co-evaluation              AM-PAC OT "6 Clicks" Daily Activity     Outcome Measure Help from another person eating meals?: A Little Help from another person taking care of personal grooming?: A Little Help from another person toileting, which includes using toliet, bedpan, or urinal?: A Lot Help from another person bathing (including washing, rinsing, drying)?: A Lot Help from another person to put on and taking off regular upper body clothing?: A Little Help from another person to put on and taking off regular lower body clothing?: A Lot 6 Click Score: 15   End of Session Equipment Utilized During Treatment: Gait belt;Rolling walker Nurse Communication: Mobility status  Activity Tolerance: Patient tolerated treatment well Patient left: in bed;with call bell/phone within reach;with bed alarm set  OT Visit Diagnosis: Unsteadiness on feet (R26.81);Muscle weakness (generalized) (M62.81);Other symptoms and signs involving cognitive function                Time: 5361-4431 OT Time Calculation (min): 23 min Charges:  OT General Charges $OT Visit: 1  Visit OT Evaluation $OT Eval Moderate Complexity: 1 Mod OT Treatments $Self Care/Home Management : 8-22 mins  Jefferey Pica, OTR/L Acute Rehabilitation Services Pager: [phone] Office: [phone]   Laquandra Carrillo C 10/29/2020, 12:44 PM

## 2020-10-30 ENCOUNTER — Inpatient Hospital Stay (HOSPITAL_COMMUNITY): Payer: Medicare Other

## 2020-10-30 DIAGNOSIS — A4151 Sepsis due to Escherichia coli [E. coli]: Secondary | ICD-10-CM | POA: Diagnosis not present

## 2020-10-30 DIAGNOSIS — R410 Disorientation, unspecified: Secondary | ICD-10-CM | POA: Diagnosis not present

## 2020-10-30 DIAGNOSIS — N3000 Acute cystitis without hematuria: Secondary | ICD-10-CM | POA: Diagnosis not present

## 2020-10-30 LAB — COMPREHENSIVE METABOLIC PANEL
ALT: 29 U/L (ref 0–44)
AST: 32 U/L (ref 15–41)
Albumin: 2.6 g/dL — ABNORMAL LOW (ref 3.5–5.0)
Alkaline Phosphatase: 47 U/L (ref 38–126)
Anion gap: 6 (ref 5–15)
BUN: 28 mg/dL — ABNORMAL HIGH (ref 8–23)
CO2: 26 mmol/L (ref 22–32)
Calcium: 8.3 mg/dL — ABNORMAL LOW (ref 8.9–10.3)
Chloride: 110 mmol/L (ref 98–111)
Creatinine, Ser: 1.64 mg/dL — ABNORMAL HIGH (ref 0.61–1.24)
GFR, Estimated: 45 mL/min — ABNORMAL LOW (ref >60)
Glucose, Bld: 118 mg/dL — ABNORMAL HIGH (ref 70–99)
Potassium: 3.8 mmol/L (ref 3.5–5.1)
Sodium: 142 mmol/L (ref 135–145)
Total Bilirubin: 1 mg/dL (ref 0.3–1.2)
Total Protein: 5.9 g/dL — ABNORMAL LOW (ref 6.5–8.1)

## 2020-10-30 LAB — PATHOLOGIST SMEAR REVIEW

## 2020-10-30 LAB — CBC
HCT: 32 % — ABNORMAL LOW (ref 39.0–52.0)
Hemoglobin: 10.9 g/dL — ABNORMAL LOW (ref 13.0–17.0)
MCH: 29.8 pg (ref 26.0–34.0)
MCHC: 34.1 g/dL (ref 30.0–36.0)
MCV: 87.4 fL (ref 80.0–100.0)
Platelets: 123 10*3/uL — ABNORMAL LOW (ref 150–400)
RBC: 3.66 MIL/uL — ABNORMAL LOW (ref 4.22–5.81)
RDW: 14.1 % (ref 11.5–15.5)
WBC: 10 10*3/uL (ref 4.0–10.5)
nRBC: 0 % (ref 0.0–0.2)

## 2020-10-30 MED ORDER — SODIUM CHLORIDE 0.9 % IV SOLN: INTRAVENOUS | Status: DC

## 2020-10-30 NOTE — Progress Notes (Signed)
Pt pulled out IV

## 2020-10-30 NOTE — TOC Initial Note (Addendum)
Transition of Care [facility]) - Initial/Assessment Note    Patient Details  Name: [name] MRN: [id number] Date of Birth: [birth year]  Transition of Care [facility] [place]) CM/SW Contact:    Joanne Chars, LCSW Phone Number: 10/30/2020, 9:28 AM  Clinical Narrative:  CSW attempted to meet with pt who was unable to participate.  CSW inquired about family and pt mentioned his daughter.  Per facesheet, only wife listed as contact, current phone number not working.  CSW spoke with Reeshema at [facility] is pt only contact and is very difficult to get ahold of.  They have been unable to reach her since pt was sent to the hospital.  Attempt made to contact wife.  Unsuccessful.  Will continue to call.   Pt will return to [facility] at discharge.                   Expected Discharge Plan: Long Term Nursing Home Barriers to Discharge: Continued Medical Work up   Patient Goals and CMS Choice   CMS Medicare.gov Compare Post Acute Care list provided to::  (na-current resident at [facility])    Expected Discharge Plan and Services Expected Discharge Plan: [place] Acute Care Choice: Nursing Home Living arrangements for the past 2 months: [place] (LTC)                                      Prior Living Arrangements/Services Living arrangements for the past 2 months: [place] (LTC) Lives with:: Facility Resident Patient language and need for interpreter reviewed:: Yes        Need for Family Participation in Patient Care: Yes (Comment) Care giver support system in place?: Yes (comment) Current home services: Other (comment) (na) Criminal Activity/Legal Involvement Pertinent to Current Situation/Hospitalization: No - Comment as needed  Activities of Daily Living      Permission Sought/Granted                  Emotional Assessment Appearance:: Appears stated age Attitude/Demeanor/Rapport: Unable to Assess Affect  (typically observed): Unable to Assess Orientation: : Oriented to Self Alcohol / Substance Use: Not Applicable Psych Involvement: Outpatient Provider  Admission diagnosis:  Delirium [R41.0] Acute cystitis without hematuria [N30.00] Acute encephalopathy [G93.40] Patient Active Problem List   Diagnosis Date Noted  . Acute cystitis without hematuria   . Closed fracture of bone of right foot   . Altered mental status 07/07/2020  . Insomnia 06/21/2020  . Anxiety 06/18/2020  . Acute encephalopathy   . Cognitive impairment 06/12/2020  . AMS (altered mental status) 06/05/2020  . H/O noncompliance with medical treatment, presenting hazards to health   . DVT (deep venous thrombosis) ([place]) 06/04/2020  . Schizophrenia ([place])   . Homeless   . Dislocation of tarsometatarsal joint   . History of hepatitis B 04/26/2020  . Chronic hepatitis ([place])   . Dementia without behavioral disturbance ([place])   . Atrial fibrillation with RVR ([place]) 04/24/2020  . Malnutrition of moderate degree 04/24/2020  . History of drug abuse ([place]) 10/18/2017  . Tobacco abuse   . Diastolic heart failure ([place]) 02/05/2015  . Healthcare maintenance 05/28/2014  . Hyperlipidemia 03/10/2014  . [place] OCCLUSION 06/03/2010  . PARANOID SCHIZOPHRENIA, CHRONIC 01/17/2009  . HYPERTENSION, BENIGN ESSENTIAL 01/17/2009  . BPH (benign prostatic hyperplasia) 01/17/2009  . History of hepatitis C  12/14/2008  . DEPRESSION 12/14/2008   PCP:  Danna Hefty, DO Pharmacy:   [place], [place] [address] Phone: [phone] Fax: [phone]     Social Determinants of Health (SDOH) Interventions    Readmission Risk Interventions No flowsheet data found.

## 2020-10-30 NOTE — NC FL2 (Signed)
[place] LEVEL OF CARE SCREENING TOOL     IDENTIFICATION  Patient Name: [name] Birthdate: 01-Dec-1950 Sex: male Admission Date (Current Location): 10/28/2020  [place] and [place] Number:  Kathleen Argue [id number] [place] and Address:  [facility] [place] [place] [address], [place],  [address]      Provider Number: [id number]  Attending Physician Name and Address:  Leeanne Rio, MD  Relative Name and Phone Number:  Del[place], Stelle [phone]    Current Level of Care: Hospital Recommended Level of Care: Nursing Facility Prior Approval Number:    Date Approved/Denied:   PASRR Number: [id number]  Discharge Plan: Other (Comment) (LTC)    Current Diagnoses: Patient Active Problem List   Diagnosis Date Noted  . Acute cystitis without hematuria   . Closed fracture of bone of right foot   . Altered mental status 07/07/2020  . Insomnia 06/21/2020  . Anxiety 06/18/2020  . Acute encephalopathy   . Cognitive impairment 06/12/2020  . AMS (altered mental status) 06/05/2020  . H/O noncompliance with medical treatment, presenting hazards to health   . DVT (deep venous thrombosis) ([place]) 06/04/2020  . Schizophrenia ([place])   . Homeless   . Dislocation of tarsometatarsal joint   . History of hepatitis B 04/26/2020  . Chronic hepatitis ([place])   . Dementia without behavioral disturbance ([place])   . Atrial fibrillation with RVR ([place]) 04/24/2020  . Malnutrition of moderate degree 04/24/2020  . History of drug abuse ([place]) 10/18/2017  . Tobacco abuse   . Diastolic heart failure ([place]) 02/05/2015  . Healthcare maintenance 05/28/2014  . Hyperlipidemia 03/10/2014  . [place] OCCLUSION 06/03/2010  . PARANOID SCHIZOPHRENIA, CHRONIC 01/17/2009  . HYPERTENSION, BENIGN ESSENTIAL 01/17/2009  . BPH (benign prostatic hyperplasia) 01/17/2009  . History of hepatitis C 12/14/2008  . DEPRESSION 12/14/2008    Orientation RESPIRATION  BLADDER Height & Weight     Self  Normal Incontinent Weight: 141 lb 1.5 oz (64 kg) Height:  5\' 9"  (175.3 cm)  BEHAVIORAL SYMPTOMS/MOOD NEUROLOGICAL BOWEL NUTRITION STATUS      Continent Diet (see discharge summary)  AMBULATORY STATUS COMMUNICATION OF NEEDS Skin   Limited Assist Verbally Normal                       Personal Care Assistance Level of Assistance  Bathing,Feeding,Dressing Bathing Assistance: Maximum assistance Feeding assistance: Limited assistance Dressing Assistance: Maximum assistance     Functional Limitations Info  Sight,Hearing,Speech Sight Info: Adequate Hearing Info: Adequate Speech Info: Impaired    SPECIAL CARE FACTORS FREQUENCY  PT (By licensed PT),OT (By licensed OT)     PT Frequency: 5x week OT Frequency: 5x week            Contractures Contractures Info: Not present    Additional Factors Info  Code Status,Allergies Code Status Info: full Allergies Info: Lisinopril, Penicillins, Amoxicillin, Ibuprofen           Current Medications (10/30/2020):  This is the current hospital active medication list Current Facility-Administered Medications  Medication Dose Route Frequency Provider Last Rate Last Admin  . acetaminophen (TYLENOL) tablet 650 mg  650 mg Oral Q6H PRN [place] Payment, MD   650 mg at 10/29/20 1535   Or  . acetaminophen (TYLENOL) suppository 650 mg  650 mg Rectal Q6H PRN Marianna Payment, MD      . amLODipine (NORVASC) tablet 10 mg  10 mg Oral Daily Lattie Haw, MD   10 mg at  10/30/20 0818  . benztropine (COGENTIN) tablet 1 mg  1 mg Oral BID Lattie Haw, MD   1 mg at 10/30/20 0818  . enoxaparin (LOVENOX) injection 40 mg  40 mg Subcutaneous Q24H Espinoza, Alejandra, DO   40 mg at 10/29/20 2038  . fluPHENAZine decanoate (PROLIXIN) injection 25 mg  25 mg Intramuscular Q14 Days Lilland, Alana, DO   25 mg at 10/30/20 0043  . folic acid (FOLVITE) tablet 1 mg  1 mg Oral Daily Alvira Philips, [place]   1 mg at 10/30/20 0321  .  meropenem (MERREM) 1 g in sodium chloride 0.9 % 100 mL IVPB  1 g Intravenous Q12H Cala Bradford, RPH 200 mL/hr at 10/30/20 0829 1 g at 10/30/20 0829  . multivitamin with minerals tablet 1 tablet  1 tablet Oral Daily [place]na Payment, MD   1 tablet at 10/30/20 0818  . tamsulosin (FLOMAX) capsule 0.8 mg  0.8 mg Oral q morning Lilland, Alana, DO   0.8 mg at 10/30/20 0818  . thiamine tablet 100 mg  100 mg Oral Daily Alvira Philips, [place]   100 mg at 10/30/20 2248     Discharge Medications: Please see discharge summary for a list of discharge medications.  Relevant Imaging Results:  Relevant Lab Results:   Additional Information SSN: [id number]  Joanne Chars, LCSW

## 2020-10-31 DIAGNOSIS — R410 Disorientation, unspecified: Secondary | ICD-10-CM | POA: Diagnosis not present

## 2020-10-31 DIAGNOSIS — N3000 Acute cystitis without hematuria: Secondary | ICD-10-CM | POA: Diagnosis not present

## 2020-10-31 DIAGNOSIS — A4151 Sepsis due to Escherichia coli [E. coli]: Secondary | ICD-10-CM | POA: Diagnosis not present

## 2020-10-31 LAB — CBC
HCT: 31.4 % — ABNORMAL LOW (ref 39.0–52.0)
Hemoglobin: 10.7 g/dL — ABNORMAL LOW (ref 13.0–17.0)
MCH: 29.1 pg (ref 26.0–34.0)
MCHC: 34.1 g/dL (ref 30.0–36.0)
MCV: 85.3 fL (ref 80.0–100.0)
Platelets: 115 10*3/uL — ABNORMAL LOW (ref 150–400)
RBC: 3.68 MIL/uL — ABNORMAL LOW (ref 4.22–5.81)
RDW: 13.9 % (ref 11.5–15.5)
WBC: 8.8 10*3/uL (ref 4.0–10.5)
nRBC: 0 % (ref 0.0–0.2)

## 2020-10-31 LAB — COMPREHENSIVE METABOLIC PANEL
ALT: 33 U/L (ref 0–44)
AST: 36 U/L (ref 15–41)
Albumin: 2.5 g/dL — ABNORMAL LOW (ref 3.5–5.0)
Alkaline Phosphatase: 58 U/L (ref 38–126)
Anion gap: 7 (ref 5–15)
BUN: 21 mg/dL (ref 8–23)
CO2: 24 mmol/L (ref 22–32)
Calcium: 8.2 mg/dL — ABNORMAL LOW (ref 8.9–10.3)
Chloride: 106 mmol/L (ref 98–111)
Creatinine, Ser: 1.28 mg/dL — ABNORMAL HIGH (ref 0.61–1.24)
GFR, Estimated: >60 mL/min (ref >60)
Glucose, Bld: 106 mg/dL — ABNORMAL HIGH (ref 70–99)
Potassium: 3.3 mmol/L — ABNORMAL LOW (ref 3.5–5.1)
Sodium: 137 mmol/L (ref 135–145)
Total Bilirubin: 0.8 mg/dL (ref 0.3–1.2)
Total Protein: 5.9 g/dL — ABNORMAL LOW (ref 6.5–8.1)

## 2020-10-31 LAB — CULTURE, BLOOD (ROUTINE X 2)
Special Requests: ADEQUATE
Special Requests: ADEQUATE

## 2020-10-31 LAB — GLUCOSE, CAPILLARY
Glucose-Capillary: 109 mg/dL — ABNORMAL HIGH (ref 70–99)
Glucose-Capillary: 149 mg/dL — ABNORMAL HIGH (ref 70–99)
Glucose-Capillary: 102 mg/dL — ABNORMAL HIGH (ref 70–99)
Glucose-Capillary: 149 mg/dL — ABNORMAL HIGH (ref 70–99)

## 2020-10-31 LAB — SARS CORONAVIRUS 2 (TAT 6-24 HRS): SARS Coronavirus 2: NEGATIVE

## 2020-10-31 MED ORDER — SULFAMETHOXAZOLE-TRIMETHOPRIM 800-160 MG PO TABS
1.0000 | ORAL_TABLET | Freq: Two times a day (BID) | ORAL | Status: DC
  Administered 2020-10-31 – 2020-11-01 (×2): 1 via ORAL
  Filled 2020-10-31 (×2): qty 1

## 2020-10-31 MED ORDER — POTASSIUM CHLORIDE CRYS ER 20 MEQ PO TBCR
40.0000 meq | EXTENDED_RELEASE_TABLET | Freq: Two times a day (BID) | ORAL | Status: DC
  Administered 2020-10-31: 40 meq via ORAL
  Filled 2020-10-31: qty 2

## 2020-10-31 NOTE — TOC Progression Note (Signed)
Transition of Care [facility]) - Progression Note    Patient Details  Name: [name] MRN: [id number] Date of Birth: [birth year]  Transition of Care [facility]) CM/SW Contact  Joanne Chars, LCSW Phone Number: 10/31/2020, 11:08 AM  Clinical Narrative:  CSW attempted to contact wife by phone call and text.       Expected Discharge Plan: Long Term Nursing Home Barriers to Discharge: Continued Medical Work up  Expected Discharge Plan and Services Expected Discharge Plan: [place] Acute Care Choice: Nursing Home Living arrangements for the past 2 months: [place] (LTC)                                       Social Determinants of Health (SDOH) Interventions    Readmission Risk Interventions No flowsheet data found.

## 2020-10-31 NOTE — Progress Notes (Signed)
Occupational Therapy Treatment Patient Details Name: [name] MRN: [id number] DOB: 1951/01/26 Today's Date: 10/31/2020    History of present illness 70 y.o. M w/ PMHx paranoid schizophrenia, dementia, chronic hepatitis, HFpEF, HTN, A-Fib with RVR, DVT, BPH, and insomnia who was brought to the ED 10/28/20 by EMS from [facility] due to AMS. Per ED, at baseline he is able to communicate and walk with a walker. Acute encephalopathy, AKI, urosepsis   OT comments  Pt progressing somewhat towards acute OT goals. Focus of session was grooming tasks seated EOB, functional transfers, and sidestepping along EOB. Details of OT session below. D/c plan remains appropriate.    Follow Up Recommendations  Supervision/Assistance - 24 hour;SNF    Equipment Recommendations  None recommended by OT    Recommendations for Other Services      Precautions / Restrictions Precautions Precautions: Fall Restrictions Weight Bearing Restrictions: No       Mobility Bed Mobility Overal bed mobility: Needs Assistance Bed Mobility: Supine to Sit;Sit to Supine     Supine to sit: Min assist Sit to supine: Min assist   General bed mobility comments: Assist to advance BLE onto bed.    Transfers Overall transfer level: Needs assistance Equipment used: None;Rolling walker (2 wheeled) (Pt varied in his use of rw vs other means of external support) Transfers: Sit to/from Stand Sit to Stand: Min assist         General transfer comment: Min A to boost from EOB and to control descent    Balance Overall balance assessment: Needs assistance Sitting-balance support: No upper extremity supported;Feet unsupported Sitting balance-Leahy Scale: Fair     Standing balance support: No upper extremity supported;Bilateral upper extremity supported Standing balance-Leahy Scale: Poor Standing balance comment: seeks external support in static standing                           ADL either performed or  assessed with clinical judgement   ADL Overall ADL's : Needs assistance/impaired     Grooming: Set up;Minimal assistance;Oral care;Wash/dry face;Sitting;Min guard Grooming Details (indicate cue type and reason): sat EOB to complete 2 grooming tasks. Assist for throughness and full completion of activity.                     Toileting- Clothing Manipulation and Hygiene: Maximal assistance;Cueing for safety;Sit to/from stand Toileting - Clothing Manipulation Details (indicate cue type and reason): Pt stood with min A for balance and total A for pericare.     Functional mobility during ADLs: Minimal assistance;Cueing for safety;Cueing for sequencing General ADL Comments: Pt sat EOB to complete 2 grooming tasks. Pt then stood 3x from EOB with side steps taken on his last stand. Pt unsteady in static standing utilizing legs pushing into bed frame vs rw vs +1 HHA to steady self.     Vision       Perception     Praxis      Cognition Arousal/Alertness: Awake/alert Behavior During Therapy: Flat affect;Anxious (at times appeared anxious, able to redirect) Overall Cognitive Status: No family/caregiver present to determine baseline cognitive functioning                                 General Comments: known cognitive deficits PTA, however unsure if at baseline. Pt with mumbling speech, OT relying more on pt's body language/gestures for trying to decode verbal  communication. Inconsistent one step command following.        Exercises     Shoulder Instructions       General Comments Provided  toiletry bottle for pt to hold as pt seeks items to fidget with.    Pertinent Vitals/ Pain       Pain Assessment: Faces Faces Pain Scale: No hurt  Home Living                                          Prior Functioning/Environment              Frequency  Min 2X/week        Progress Toward Goals  OT Goals(current goals can now be found in  the care plan section)  Progress towards OT goals: Progressing toward goals  Acute Rehab OT Goals Patient Stated Goal: unable to state; verbalizations very garbled OT Goal Formulation: Patient unable to participate in goal setting Time For Goal Achievement: 11/12/20 Potential to Achieve Goals: [place] Discharge plan remains appropriate    Co-evaluation                 AM-PAC OT "6 Clicks" Daily Activity     Outcome Measure   Help from another person eating meals?: A Little Help from another person taking care of personal grooming?: A Little Help from another person toileting, which includes using toliet, bedpan, or urinal?: A Lot Help from another person bathing (including washing, rinsing, drying)?: A Lot Help from another person to put on and taking off regular upper body clothing?: A Little Help from another person to put on and taking off regular lower body clothing?: A Lot 6 Click Score: 15    End of Session Equipment Utilized During Treatment: Rolling walker;Other (comment) (vs none. inconsistent adherence to using rw)  OT Visit Diagnosis: Unsteadiness on feet (R26.81);Muscle weakness (generalized) (M62.81);Other symptoms and signs involving cognitive function   Activity Tolerance Patient tolerated treatment well   Patient Left in bed;with call bell/phone within reach;with bed alarm set   Nurse Communication          Time: 3557-3220 OT Time Calculation (min): 30 min  Charges: OT General Charges $OT Visit: 1 Visit OT Treatments $Self Care/Home Management : 23-37 mins  Tyrone Schimke, OT Acute Rehabilitation Services Pager: [phone] Office: [phone]    Hortencia Pilar 10/31/2020, 1:52 PM

## 2020-10-31 NOTE — Progress Notes (Signed)
Family Medicine Teaching Service Daily Progress Note Intern Pager: [phone]  Patient name: [name] Medical record number: [id number] Date of birth: 1951-07-25 Age: 70 y.o. Gender: male  Primary Care Provider: Danna Hefty, DO Consultants: None Code Status: Full  Pt Overview and Major Events to Date:  3/14 - Admitted 3/15 - ESBL on blood culture, Meropenem started  Assessment and Plan: [name] a 70 y.o.malepresenting with altered mental status and found to have a UTI as well. PMH is significant forprimary schizophrenia, HFpEF, HTN, HLD, BPH, tobacco, abuse, GERD, hx of Hep C s/p Harvoni, hx of Hep B cleared.  ESBL bacteremia Sepsis 2/2 UTI, improving VSS.Cefepime (3/14), Meropenem (3/15-3/17) and starting Bactrim (3/17) to finish total 7-day antibiotic course.  We will observe patient on oral medications and anticipate possible discharge tomorrow. - Bactrim (abx day 3/7) - Monitor for further fevers - Continue IVF - Bedrest  - Delirium precautions - Covid test  Acute encephalopathy  Schizophrenia : improving Continues to improve, patient is able to state that he feels all right today.  Patient has echolalia at baseline. - Fluphenazine given 3/16 at 1am (was due 3/14 per SNF report) - Continue thiamine and folic acid - Holding trazodone and hydroxyzine, can add back if agitation present - PT/OT  Hypokalemia K 3.3 this AM, repleted with 64mEq K - Monitor with AM BMP  AKI on CKD Stage IIIa Cr1.65>1.4>1.58>1.64>1.28. Baseline 1.22.  - Monitor with BMP -ContinueIVF   Thrombocytopenia: chronic, stable Platelets147>88>123>115.  - Continue to monitor  HTN: chronic, stable BP since admission 106-129/70-79 - Continue home amlodipine - Continue to monitor BP  BPH, chronic, stable - Continue home tamsulosin    FEN/GI:Regular diet (finger foods) HYI:FOYDXAJ    Status is: Inpatient  Remains inpatient appropriate  because:Inpatient level of care appropriate due to severity of illness   Dispo: The patient is from: SNF              Anticipated d/c is to: SNF              Patient currently is not medically stable to d/c.   Difficult to place patient No     Subjective:  Patient ports that he is doing all right today.  Did not voice any concerns at this time.  Objective: Temp:  [98 F (36.7 C)-98.8 F (37.1 C)] 98.7 F (37.1 C) (03/17 0755) Pulse Rate:  [68-86] 68 (03/17 0755) Resp:  [15-20] 15 (03/17 0755) BP: (118-139)/(81-86) 139/85 (03/17 0755) SpO2:  [94 %-100 %] 100 % (03/17 0755) Physical Exam: General: NAD, supine in bed, interactive during exam Cardiovascular: RRR, 2+ peripheral pulses Respiratory: Comfortable on room air, no increased work of breathing, good auscultation bilaterally Abdomen: Soft, nontender, nondistended, bowel sounds present Extremities: Moving all extremities equally and appropriately.  Gauze noted on right forearm  Laboratory: Recent Labs  Lab 10/29/20 0329 10/30/20 0313 10/31/20 0355  WBC 10.4 10.0 8.8  HGB 11.1* 10.9* 10.7*  HCT 32.3* 32.0* 31.4*  PLT 88* 123* 115*   Recent Labs  Lab 10/29/20 0329 10/30/20 0313 10/31/20 0355  NA 142 142 137  K 3.7 3.8 3.3*  CL 111 110 106  CO2 24 26 24   BUN 24* 28* 21  CREATININE 1.58* 1.64* 1.28*  CALCIUM 8.5* 8.3* 8.2*  PROT 6.3* 5.9* 5.9*  BILITOT 1.1 1.0 0.8  ALKPHOS 42 47 58  ALT 33 29 33  AST 43* 32 36  GLUCOSE 113* 118* 106*     Imaging/Diagnostic  Tests: US RENAL  Result Date: 10/30/2020 CLINICAL DATA:  Urinary tract infection. EXAM: RENAL / URINARY TRACT ULTRASOUND COMPLETE COMPARISON:  CT angiography 08/20/2016, no interval imaging of the kidneys. FINDINGS: Right Kidney: Renal measurements: 9.9 x 6.2 x 5.0 cm = volume: 160 mL. No hydronephrosis. Mild diffusely increased renal parenchymal echogenicity. 1.1 cm cyst in the upper kidney. Trace perinephric fluid. No intrarenal or focal pararenal  collection. Left Kidney: Renal measurements: 11.3 x 4.9 x 4.2 cm = volume: 120 mL. No hydronephrosis. Mild diffusely increased renal parenchymal echogenicity. Trace perinephric fluid without focal fluid collection. No focal lesion. Bladder: Appears normal for degree of bladder distention. No definite bladder wall thickening. No debris in the urinary bladder. Other: None. IMPRESSION: 1. Trace perinephric fluid which can be seen in the setting of urinary tract infection. There is no hydronephrosis or focal renal fluid collection/abscess. 2. Mild increased renal parenchymal echogenicity suggesting chronic medical renal disease. 3. Small right renal cyst. Electronically Signed   By: Keith Rake M.D.   On: 10/30/2020 18:30     [name], DO 10/31/2020, 11:48 AM PGY-1, [place] Intern pager: [phone], text pages welcome

## 2020-10-31 NOTE — Progress Notes (Incomplete)
Family Medicine Teaching Service Daily Progress Note Intern Pager: [phone]  Patient name: [name] Medical record number: [id number] Date of birth: 01-11-1951 Age: 70 y.o. Gender: male  Primary Care Provider: Danna Hefty, DO Consultants: None Code Status: Full  Pt Overview and Major Events to Date:  3/14 - Admitted 3/15 - ESBL on blood culture, Meropenem started  Assessment and Plan: [name] a 70 y.o.malepresenting with altered mental status and found to have a UTI as well. PMH is significant forprimary schizophrenia, HFpEF, HTN, HLD, BPH, tobacco, abuse, GERD, hx of Hep C s/p Harvoni, hx of Hep B cleared.  ESBL bacteremia Sepsis 2/2 UTI, improving VSS.Cefepime (3/14),Meropenem (3/15-3/17) and starting Bactrim (3/17-) to finish total 7-day antibiotic course.  We will observe patient on oral medications and anticipate possible discharge tomorrow. - Bactrim (abx day 4/7) - Monitor for further fevers - Continue IVF - Bedrest  - Delirium precautions - Covid test  Acute encephalopathy  Schizophrenia : improving Continues to improve, patient is able to state that he feels all right today.  Patient has echolalia at baseline. -Fluphenazine given 3/16 at 1am(was due 3/14 per SNF report) - Continue thiamine and folic acid -Holding trazodone and hydroxyzine, can add back if agitation present - PT/OT  Hypokalemia *** - Monitor with AM BMP  AKI on CKD Stage IIIa Cr1.64>1.28>***. Baseline 1.22.  - Monitor with BMP -ContinueIVF   Thrombocytopenia: chronic, stable Platelets147>88>123>115>***.  - Continue to monitor  HTN: chronic, stable BP since admission*** - Continue home amlodipine - Continue to monitor BP  BPH, chronic, stable - Continue home tamsulosin    FEN/GI:Regular diet (finger foods) [facility]:ENIDPOE FEN/GI: *** PPx: ***   Status is: Inpatient  {Inpatient:23812}  Dispo: The patient is from: {From:23814}               Anticipated d/c is to: {To:23815}              Patient currently {Medically stable:23817}   Difficult to place patient {Yes/No:25151}        Subjective:  ***  Objective: Temp:  [97.5 F (36.4 C)-98.8 F (37.1 C)] 97.5 F (36.4 C) (03/17 1959) Pulse Rate:  [68-84] 73 (03/17 1959) Resp:  [15-20] 15 (03/17 1226) BP: (109-139)/(77-85) 109/81 (03/17 1959) SpO2:  [96 %-100 %] 96 % (03/17 1959) Physical Exam: General: *** Cardiovascular: *** Respiratory: *** Abdomen: *** Extremities: ***  Laboratory: Recent Labs  Lab 10/29/20 0329 10/30/20 0313 10/31/20 0355  WBC 10.4 10.0 8.8  HGB 11.1* 10.9* 10.7*  HCT 32.3* 32.0* 31.4*  PLT 88* 123* 115*   Recent Labs  Lab 10/29/20 0329 10/30/20 0313 10/31/20 0355  NA 142 142 137  K 3.7 3.8 3.3*  CL 111 110 106  CO2 24 26 24   BUN 24* 28* 21  CREATININE 1.58* 1.64* 1.28*  CALCIUM 8.5* 8.3* 8.2*  PROT 6.3* 5.9* 5.9*  BILITOT 1.1 1.0 0.8  ALKPHOS 42 47 58  ALT 33 29 33  AST 43* 32 36  GLUCOSE 113* 118* 106*    ***  Imaging/Diagnostic Tests: No results found.   [name], DO 10/31/2020, 9:16 PM PGY-1, [place] Intern pager: [phone], text pages welcome

## 2020-11-01 DIAGNOSIS — N3000 Acute cystitis without hematuria: Secondary | ICD-10-CM | POA: Diagnosis not present

## 2020-11-01 DIAGNOSIS — R410 Disorientation, unspecified: Secondary | ICD-10-CM | POA: Diagnosis not present

## 2020-11-01 DIAGNOSIS — A4151 Sepsis due to Escherichia coli [E. coli]: Secondary | ICD-10-CM | POA: Diagnosis not present

## 2020-11-01 DIAGNOSIS — G9340 Encephalopathy, unspecified: Secondary | ICD-10-CM

## 2020-11-01 LAB — CBC
HCT: 30.4 % — ABNORMAL LOW (ref 39.0–52.0)
Hemoglobin: 10.5 g/dL — ABNORMAL LOW (ref 13.0–17.0)
MCH: 29.6 pg (ref 26.0–34.0)
MCHC: 34.5 g/dL (ref 30.0–36.0)
MCV: 85.6 fL (ref 80.0–100.0)
Platelets: 151 10*3/uL (ref 150–400)
RBC: 3.55 MIL/uL — ABNORMAL LOW (ref 4.22–5.81)
RDW: 14.1 % (ref 11.5–15.5)
WBC: 7.5 10*3/uL (ref 4.0–10.5)
nRBC: 0 % (ref 0.0–0.2)

## 2020-11-01 LAB — BASIC METABOLIC PANEL
Anion gap: 6 (ref 5–15)
BUN: 19 mg/dL (ref 8–23)
CO2: 28 mmol/L (ref 22–32)
Calcium: 8.4 mg/dL — ABNORMAL LOW (ref 8.9–10.3)
Chloride: 104 mmol/L (ref 98–111)
Creatinine, Ser: 1.32 mg/dL — ABNORMAL HIGH (ref 0.61–1.24)
GFR, Estimated: 58 mL/min — ABNORMAL LOW (ref >60)
Glucose, Bld: 93 mg/dL (ref 70–99)
Potassium: 3.6 mmol/L (ref 3.5–5.1)
Sodium: 138 mmol/L (ref 135–145)

## 2020-11-01 LAB — URINE CULTURE: Culture: >=100000 — AB

## 2020-11-01 MED ORDER — HYDROXYZINE HCL 25 MG PO TABS
25.0000 mg | ORAL_TABLET | Freq: Once | ORAL | Status: AC
  Administered 2020-11-01: 25 mg via ORAL
  Filled 2020-11-01: qty 1

## 2020-11-01 MED ORDER — SULFAMETHOXAZOLE-TRIMETHOPRIM 800-160 MG PO TABS: 1.0000 | ORAL_TABLET | Freq: Two times a day (BID) | ORAL | 0 refills | Status: AC

## 2020-11-01 NOTE — Progress Notes (Signed)
Physical Therapy Treatment Patient Details Name: [name] MRN: [id number] DOB: [birth year] Today's Date: 11/01/2020    History of Present Illness 70 y.o. M w/ PMHx paranoid schizophrenia, dementia, chronic hepatitis, HFpEF, HTN, A-Fib with RVR, DVT, BPH, and insomnia who was brought to the ED 10/28/20 by EMS from [facility] due to AMS. Per ED, at baseline he is able to communicate and walk with a walker. Acute encephalopathy, AKI, urosepsis    PT Comments    Pt with improved ambulation tolerance. Pt able to respond to manual cueing to increase safety and distance with ambulation with RW. Pt continues to have difficulty with verbal cueing and demonstrates increased confusion. Pt unable to verbalize but was smiling during ambulation. Pt continues to demonstrate deficits in balance, strength, coordination, endurance and safety and will benefit from skilled PT to address deficits ot maximize independence with functional moblity prior to discharge.     Follow Up Recommendations  SNF;Supervision/Assistance - 24 hour     Equipment Recommendations  Rolling walker with 5" wheels    Recommendations for Other Services       Precautions / Restrictions Precautions Precautions: Fall Restrictions Weight Bearing Restrictions: No    Mobility  Bed Mobility Overal bed mobility: Needs Assistance Bed Mobility: Supine to Sit;Sit to Supine     Supine to sit: Supervision Sit to supine: Supervision        Transfers Overall transfer level: Needs assistance Equipment used: Rolling walker (2 wheeled) Transfers: Sit to/from Stand Sit to Stand: Min assist         General transfer comment: Min A to boost from EOB and to control descent  Ambulation/Gait Ambulation/Gait assistance: Min assist Gait Distance (Feet): 200 Feet Assistive device: Rolling walker (2 wheeled)       General Gait Details: manual assist for UE placement on RW and position in RW prior to ambulation. during ambulation  min guard and assist for RW management with turns   Stairs             Wheelchair Mobility    Modified Rankin (Stroke Patients Only)       Balance Overall balance assessment: Needs assistance Sitting-balance support: No upper extremity supported;Feet unsupported Sitting balance-Leahy Scale: Fair     Standing balance support: Bilateral upper extremity supported Standing balance-Leahy Scale: Poor                              Cognition                                       General Comments: known cognitive deficits PTA, however unsure if at baseline. Pt with mumbling speech. Pt able to respond to manual cueing      Exercises      General Comments        Pertinent Vitals/Pain      Home Living                      Prior Function            PT Goals (current goals can now be found in the care plan section) Acute Rehab PT Goals Patient Stated Goal: unable to state; verbalizations very garbled PT Goal Formulation: Patient unable to participate in goal setting Time For Goal Achievement: 11/12/20 Potential to Achieve Goals: Fair Progress towards PT  goals: Progressing toward goals    Frequency    Min 2X/week      PT Plan Current plan remains appropriate    Co-evaluation              AM-PAC PT "6 Clicks" Mobility   Outcome Measure  Help needed turning from your back to your side while in a flat bed without using bedrails?: None Help needed moving from lying on your back to sitting on the side of a flat bed without using bedrails?: A Little Help needed moving to and from a bed to a chair (including a wheelchair)?: A Little Help needed standing up from a chair using your arms (e.g., wheelchair or bedside chair)?: A Little Help needed to walk in hospital room?: A Little Help needed climbing 3-5 steps with a railing? : A Lot 6 Click Score: 18    End of Session Equipment Utilized During Treatment: Gait  belt Activity Tolerance: Patient tolerated treatment well Patient left: with call bell/phone within reach;in bed;with bed alarm set Nurse Communication: Mobility status PT Visit Diagnosis: Unsteadiness on feet (R26.81);Muscle weakness (generalized) (M62.81)     Time: 3704-8889 PT Time Calculation (min) (ACUTE ONLY): 12 min  Charges:  $Gait Training: 8-22 mins                     Lyanne Co, DPT Acute Rehabilitation Services [phone]   [place] 11/01/2020, 8:32 AM

## 2020-11-01 NOTE — Discharge Instructions (Addendum)
You were admitted with an infection in your blood. This is likely from a urine infection which spread to the blood. You received IV antibiotics in the hospital and you are now being discharged with oral antibiotics. Take these for 4 more days, stop on March 21. You were given your prolixin injection while in the hospital on March 15th; your next dose is due March 30th.  Follow up with your PCP in 1-2 days after discharge.    Best wishes,  Family Medicine Team

## 2020-11-01 NOTE — TOC Transition Note (Signed)
Transition of Care [facility]) - CM/SW Discharge Note   Patient Details  Name: [name] MRN: [id number] Date of Birth: 1951/05/21  [facility] CM/SW Contact:  Joanne Chars, LCSW Phone Number: 11/01/2020, 11:27 AM   Clinical Narrative:   Pt discharging to [facility], [facility] unit room 705.  RN call [phone] for report.    Final next level of care: Long Term Nursing Home Barriers to Discharge: Barriers Resolved   Patient Goals and CMS Choice   CMS Medicare.gov Compare Post Acute Care list provided to::  (na-current resident at [facility])    Discharge Placement              Patient chooses bed at:  [facility]) Patient to be transferred to facility by: [place] Name of family member notified: unable to reach pt wife Patient and family notified of of transfer: 11/01/20  Discharge Plan and Services     Post Acute Care Choice: Nursing Home                               Social Determinants of Health (SDOH) Interventions     Readmission Risk Interventions No flowsheet data found.

## 2020-11-01 NOTE — Progress Notes (Signed)
Report called to [place] at [facility]. Patient's PIV removed and DC information printed and placed in packet.

## 2020-11-01 NOTE — Discharge Summary (Signed)
[facility] Discharge Summary  Patient name: [name] Medical record number: [id number] Date of birth: [birth year] Age: 70 y.o. Gender: male Date of Admission: 10/28/2020  Date of Discharge: 11/01/2020  Admitting Physician: Sid Falcon, MD  Primary Care Provider: Danna Hefty, DO Consultants: None  Indication for Hospitalization: Fever and altered mental status  Discharge Diagnoses/Problem List:  ESBL bacteremia Sepsis secondary to UTI Acute encephalopathy Schizophrenia AKI on CKD stage IIIa Thrombocytopenia Hypokalemia Hypertension BPH  Disposition: SNF  Discharge Condition: Stable, improved  Discharge Exam: General: patient is well-appearing, thin, pleasant mood and cooperative, sitting up in bed.  CV: RRR, no m/r/g appreciated during exam, 2+ tibial pulses Respiratory: CTAB, comfortable on room air, no increased WOB Abd: soft, non-tender, non-distended, bowel sounds present Extremities: Gauze wrapping noted on forearm, moving all extremities equally and appropriately, some tremor noted in legs when not supported by the bed.  Brief Hospital Course:  [name] is a 70 y.o. male presenting with altered mental status and found to have a UTI as well. PMH is significant for primary schizophrenia, HFpEF, HTN, HLD, BPH, tobacco, abuse, GERD, hx of Hep C s/p Harvoni, hx of Hep B cleared.    ESBL bacteremia Sepsis 2/2 UTI, improving Patient was admitted to the hospital due altered mental status, tachycardia, fever, and some tachypnea and was found to have a UA with nitrites, large leukocytes, and many bacteria. Patient met sepsis criteria and blood and urine cultures were collected. Patient was initially started on cefepime for 1 day until blood cultures resulted with ESBL and patient was transitioned to meropenem and was further narrowed to bactrim based on sensitivities after 48 hours of appropriate IV antibiotics. Patient was continued on  bactrim on discharge to finish out a total of 7 antibiotic days from initiation of meropenem..   Acute encephalopathy  Schizophrenia  Patient was admitted with altered mental status from baseline. Home medications were held upon admission and restarted upon discharge to SNF.  AKI on CKD, stage IIIa Patient admitted with elevated creatinine of 1.65 and was given a 1L LR in the ED with some improvement. Likely prerenal secondary to dehydration in the setting of illness and recent fevers. Patient was started on IVF with subsequent improvement of creatinine to 1.32 prior to discharge.   Thrombocytopenia Patient had thrombocytopenia to 88 during admission and was 151 upon discharge. This is a condition that has occurred during previous admissions as well.    All other chronic conditions were stable and treated with home medications as appropriate.    Issues for follow-up: 1. Patient administered fluphenazine on 3/16, will need next dose on 3/30. 2. Antibiotic of Bactrim to be given and stop on March 21st. 3. Recommend re-checking BMP within the next 7 days to monitor creatinine, was 1.32 upon discharge.    Significant Procedures: None  Significant Labs and Imaging:  Recent Labs  Lab 10/30/20 0313 10/31/20 0355 11/01/20 0231  WBC 10.0 8.8 7.5  HGB 10.9* 10.7* 10.5*  HCT 32.0* 31.4* 30.4*  PLT 123* 115* 151   Recent Labs  Lab 10/28/20 0947 10/28/20 1658 10/29/20 0329 10/30/20 0313 10/31/20 0355 11/01/20 0231  NA 142 142 142 142 137 138  K 4.6 3.5 3.7 3.8 3.3* 3.6  CL 107 109 111 110 106 104  CO2 $Re'27 24 24 26 24 28  'nqQ$ GLUCOSE 125* 123* 113* 118* 106* 93  BUN 33* 26* 24* 28* 21 19  CREATININE 1.65* 1.40* 1.58* 1.64* 1.28*  1.32*  CALCIUM 9.2 8.5* 8.5* 8.3* 8.2* 8.4*  MG  --  2.0  --   --   --   --   PHOS  --  3.2  --   --   --   --   ALKPHOS 45 42 42 47 58  --   AST 36 39 43* 32 36  --   ALT 29 32 33 29 33  --   ALBUMIN 3.8 3.2* 2.9* 2.6* 2.5*  --      Results/Tests  Pending at Time of Discharge: None  Discharge Medications:  Allergies as of 11/01/2020      Reactions   Lisinopril Cough   Penicillins Hives   Amoxicillin Rash   Ibuprofen Rash      Medication List    STOP taking these medications   rivaroxaban 20 MG Tabs tablet Commonly known as: XARELTO     TAKE these medications   acetaminophen 325 MG tablet Commonly known as: TYLENOL Take 2 tablets (650 mg total) by mouth every 6 (six) hours as needed for mild pain, moderate pain or headache.   amLODipine 10 MG tablet Commonly known as: NORVASC Take 1 tablet (10 mg total) by mouth daily.   benztropine 1 MG tablet Commonly known as: COGENTIN Take 1 tablet (1 mg total) by mouth 2 (two) times daily. What changed: additional instructions   cetirizine 10 MG tablet Commonly known as: ZyrTEC Allergy Take 1 tablet (10 mg total) by mouth daily.   famotidine 20 MG tablet Commonly known as: PEPCID Take 1 tablet (20 mg total) by mouth 2 (two) times daily. What changed: when to take this   fluPHENAZine decanoate 25 MG/ML injection Commonly known as: PROLIXIN Inject 1 mL (25 mg total) into the muscle every 14 (fourteen) days. Last dose 07/08/2020. Next dose due 07/22/2020. What changed: additional instructions   folic acid 1 MG tablet Commonly known as: FOLVITE Take 1 tablet (1 mg total) by mouth daily.   hydrOXYzine 25 MG tablet Commonly known as: ATARAX/VISTARIL Take 1 tablet (25 mg total) by mouth at bedtime as needed for anxiety (Anxiety, Insomnia).   multivitamin with minerals Tabs tablet Take 1 tablet by mouth daily.   ramelteon 8 MG tablet Commonly known as: ROZEREM Take 1 tablet (8 mg total) by mouth at bedtime.   sulfamethoxazole-trimethoprim 800-160 MG tablet Commonly known as: BACTRIM DS Take 1 tablet by mouth 2 (two) times daily for 5 days.   tamsulosin 0.4 MG Caps capsule Commonly known as: FLOMAX TAKE 2 CAPSULE BY MOUTH DAILY What changed: See the new  instructions.   thiamine 100 MG tablet Take 1 tablet (100 mg total) by mouth daily.   traZODone 50 MG tablet Commonly known as: DESYREL Take 1 tablet (50 mg total) by mouth at bedtime.   vitamin C 500 MG tablet Commonly known as: ASCORBIC ACID Take 500 mg by mouth daily.       Discharge Instructions: Please refer to Patient Instructions section of EMR for full details.  Patient was counseled important signs and symptoms that should prompt return to medical care, changes in medications, dietary instructions, activity restrictions, and follow up appointments.   Follow-Up Appointments:  Follow-up Information    Mullis, Kiersten P, DO. Call.   Specialty: Family Medicine Why: Call for appt in 1-2 weeks Contact information: [address] [place] [address] [place], [place], DO 11/01/2020, 10:16 AM PGY-1,  [place]

## 2020-11-09 ENCOUNTER — Emergency Department (HOSPITAL_COMMUNITY)
Admission: EM | Admit: 2020-11-09 | Discharge: 2020-11-09 | Disposition: A | Discharge status: Home / Self Care | Payer: Medicare Other | Attending: Emergency Medicine | Admitting: Emergency Medicine

## 2020-11-09 ENCOUNTER — Emergency Department (HOSPITAL_COMMUNITY): Payer: Medicare Other

## 2020-11-09 ENCOUNTER — Encounter (HOSPITAL_COMMUNITY): Payer: Self-pay

## 2020-11-09 ENCOUNTER — Other Ambulatory Visit: Payer: Self-pay

## 2020-11-09 DIAGNOSIS — Z79899 Other long term (current) drug therapy: Secondary | ICD-10-CM | POA: Insufficient documentation

## 2020-11-09 DIAGNOSIS — F17210 Nicotine dependence, cigarettes, uncomplicated: Secondary | ICD-10-CM | POA: Diagnosis not present

## 2020-11-09 DIAGNOSIS — I480 Paroxysmal atrial fibrillation: Secondary | ICD-10-CM | POA: Diagnosis not present

## 2020-11-09 DIAGNOSIS — E860 Dehydration: Secondary | ICD-10-CM | POA: Diagnosis not present

## 2020-11-09 DIAGNOSIS — F0390 Unspecified dementia without behavioral disturbance: Secondary | ICD-10-CM | POA: Diagnosis not present

## 2020-11-09 DIAGNOSIS — I10 Essential (primary) hypertension: Secondary | ICD-10-CM | POA: Diagnosis not present

## 2020-11-09 HISTORY — DX: Unspecified dementia, unspecified severity, without behavioral disturbance, psychotic disturbance, mood disturbance, and anxiety: F03.90

## 2020-11-09 LAB — COMPREHENSIVE METABOLIC PANEL
ALT: 25 U/L (ref 0–44)
AST: 20 U/L (ref 15–41)
Albumin: 3.5 g/dL (ref 3.5–5.0)
Alkaline Phosphatase: 59 U/L (ref 38–126)
Anion gap: 7 (ref 5–15)
BUN: 28 mg/dL — ABNORMAL HIGH (ref 8–23)
CO2: 29 mmol/L (ref 22–32)
Calcium: 9.1 mg/dL (ref 8.9–10.3)
Chloride: 101 mmol/L (ref 98–111)
Creatinine, Ser: 1.74 mg/dL — ABNORMAL HIGH (ref 0.61–1.24)
GFR, Estimated: 42 mL/min — ABNORMAL LOW (ref >60)
Glucose, Bld: 102 mg/dL — ABNORMAL HIGH (ref 70–99)
Potassium: 4.5 mmol/L (ref 3.5–5.1)
Sodium: 137 mmol/L (ref 135–145)
Total Bilirubin: 0.6 mg/dL (ref 0.3–1.2)
Total Protein: 6.9 g/dL (ref 6.5–8.1)

## 2020-11-09 LAB — URINALYSIS, ROUTINE W REFLEX MICROSCOPIC
Bilirubin Urine: NEGATIVE
Glucose, UA: NEGATIVE mg/dL
Hgb urine dipstick: NEGATIVE
Ketones, ur: NEGATIVE mg/dL
Leukocytes,Ua: NEGATIVE
Nitrite: NEGATIVE
Protein, ur: NEGATIVE mg/dL
Specific Gravity, Urine: 1.011 (ref 1.005–1.030)
pH: 6 (ref 5.0–8.0)

## 2020-11-09 LAB — CBC WITH DIFFERENTIAL/PLATELET
Abs Immature Granulocytes: 0.02 10*3/uL (ref 0.00–0.07)
Basophils Absolute: 0 10*3/uL (ref 0.0–0.1)
Basophils Relative: 1 %
Eosinophils Absolute: 0.1 10*3/uL (ref 0.0–0.5)
Eosinophils Relative: 1 %
HCT: 33.2 % — ABNORMAL LOW (ref 39.0–52.0)
Hemoglobin: 10.7 g/dL — ABNORMAL LOW (ref 13.0–17.0)
Immature Granulocytes: 0 %
Lymphocytes Relative: 22 %
Lymphs Abs: 1.2 10*3/uL (ref 0.7–4.0)
MCH: 29 pg (ref 26.0–34.0)
MCHC: 32.2 g/dL (ref 30.0–36.0)
MCV: 90 fL (ref 80.0–100.0)
Monocytes Absolute: 0.4 10*3/uL (ref 0.1–1.0)
Monocytes Relative: 7 %
Neutro Abs: 3.7 10*3/uL (ref 1.7–7.7)
Neutrophils Relative %: 69 %
Platelets: 341 10*3/uL (ref 150–400)
RBC: 3.69 MIL/uL — ABNORMAL LOW (ref 4.22–5.81)
RDW: 14.7 % (ref 11.5–15.5)
WBC: 5.3 10*3/uL (ref 4.0–10.5)
nRBC: 0 % (ref 0.0–0.2)

## 2020-11-09 LAB — RAPID URINE DRUG SCREEN, HOSP PERFORMED
Amphetamines: NOT DETECTED
Barbiturates: NOT DETECTED
Benzodiazepines: NOT DETECTED
Cocaine: NOT DETECTED
Opiates: NOT DETECTED
Tetrahydrocannabinol: NOT DETECTED

## 2020-11-09 LAB — LACTIC ACID, PLASMA: Lactic Acid, Venous: 2.2 mmol/L (ref 0.5–1.9)

## 2020-11-09 LAB — CBG MONITORING, ED: Glucose-Capillary: 81 mg/dL (ref 70–99)

## 2020-11-09 LAB — MAGNESIUM: Magnesium: 2.1 mg/dL (ref 1.7–2.4)

## 2020-11-09 LAB — APTT: aPTT: 30 seconds (ref 24–36)

## 2020-11-09 LAB — PROTIME-INR
INR: 1.1 (ref 0.8–1.2)
Prothrombin Time: 13.9 seconds (ref 11.4–15.2)

## 2020-11-09 MED ORDER — SODIUM CHLORIDE 0.9 % IV SOLN: INTRAVENOUS | Status: DC

## 2020-11-09 MED ORDER — SODIUM CHLORIDE 0.9 % IV BOLUS
1000.0000 mL | Freq: Once | INTRAVENOUS | Status: AC
  Administered 2020-11-09: 1000 mL via INTRAVENOUS

## 2020-11-09 NOTE — ED Notes (Signed)
Pt's posey alarm went off, pt was found at the foot of the stretcher w/ monitoring equipment removed. Pt was assisted back into bed. Bed sheets and depend changed. Posey alarm resituated.

## 2020-11-09 NOTE — ED Notes (Signed)
Per staffing, no sitters available at this time.

## 2020-11-09 NOTE — ED Notes (Signed)
Pt has returned to [facility] with [facility].  All paperwork including AVS was sent with them.

## 2020-11-09 NOTE — ED Notes (Signed)
Attempted to call [facility] and Rehab to give report. Receptionist transferred me to a staff member who said they didn't know that pt and asked what room he stays in. I explained that I don't know that information because that information was not sent to the ED. Staff said they would transfer me back to receptionist who didn't answer. Called back and was transferred to another staff member who took report.

## 2020-11-09 NOTE — ED Notes (Signed)
Lactic acid was tubed down to lab around 1315. Called lab to check on why lactic acid was not in process. Per lab they "don't see the lactic acid". Explained that the lactic acid was sent at the same time as the rest of the patient's blood. Lab then called back at 1450 stating that they found the lactic acid and will run it. Notified Gilford Raid MD.

## 2020-11-09 NOTE — ED Triage Notes (Signed)
Pt arrived via EMS from [facility] after suspected seizure without witnesses. Staff at facility report that pt stared off when spoken to and did not respond. Pt has a hx of schizophrenia and no known hx of seizures. Ems reports GCS of 14 due to confusion. He was admitted 1 week ago with a UTI. VSS BP 120/64, HR 112, RR 20, SpO2 100%, CBG 121. Per staff, pt is mentally at his baseline. Pt did not have reported postictal period and urinary or bowel incontinence.

## 2020-11-09 NOTE — ED Notes (Addendum)
Pt noted to be at the end of the stretcher w/ blankets and gown removed, monitoring equipment removed. Pt is pulling at lines, pt is confused and not able to verbalized understanding of importance of staying in the bed for his safety. Posey alarm initiated. Provider notified. Safety precautions order for safety sitter placed.

## 2020-11-09 NOTE — ED Notes (Signed)
Pt being moved to hallway right in front of this RN's computer at nurses station d/t pt's multiple attempts to get out of bed and pt being high fall risk. CN aware.

## 2020-11-09 NOTE — ED Notes (Signed)
Pt found out of bed again after posey alarm went off. Pt assisted back in bed and posey alarm reactivated.

## 2020-11-09 NOTE — ED Notes (Signed)
Pt attempted to get out of bed, and posey alarm went off. Staff entered room to get pt back in bed and repositioned. Pt is now lying with call bell within reach and with TV on.

## 2020-11-09 NOTE — ED Provider Notes (Signed)
[facility] EMERGENCY DEPARTMENT Provider Note   CSN: [id number] Arrival date & time: 11/09/20  1148     History Chief Complaint  Patient presents with  . Possible seizure    [name] is a 70 y.o. male.  Pt presents to the ED today with a possible seizure.  EMS said they did not get a very good report, but they were told he had a brief staring spell.  Pt has a hx of schizophrenia and was admitted from 3/14-18 for AMS due to an ESBL sepsis due to UTI.  Pt is a very poor historian.  Per EMS, he is at his normal mental status baseline.  Bactrim stopped on 3/21.  Pt denies any pain.  He wants to go home.         Past Medical History:  Diagnosis Date  . Acute encephalopathy 04/23/2020  . Acute metabolic encephalopathy 03/22/7618  . AKI (acute kidney injury) ([place]) 05/12/2020  . BPH (benign prostatic hyperplasia)   . Closed displaced fracture of fifth metatarsal bone of right foot   . Colon polyp 2010  . Crush injury   . Dementia ([place])   . Depression   . GERD (gastroesophageal reflux disease)   . Hepatitis C    "caught it when I had a blood transfusion"  . High cholesterol   . History of blood transfusion    "when I was young"  . Hypertension   . Paranoid schizophrenia ([place])   . PARANOID SCHIZOPHRENIA, CHRONIC 01/17/2009  . Prostate atrophy   . Retinal vein occlusion     Patient Active Problem List   Diagnosis Date Noted  . Acute cystitis without hematuria   . Closed fracture of bone of right foot   . Altered mental status 07/07/2020  . Insomnia 06/21/2020  . Anxiety 06/18/2020  . Acute encephalopathy   . Cognitive impairment 06/12/2020  . AMS (altered mental status) 06/05/2020  . H/O noncompliance with medical treatment, presenting hazards to health   . DVT (deep venous thrombosis) ([place]) 06/04/2020  . Schizophrenia ([place])   . Homeless   . Dislocation of tarsometatarsal joint   . History of hepatitis B 04/26/2020  . Chronic hepatitis ([place])   .  Dementia without behavioral disturbance ([place])   . Atrial fibrillation with RVR ([place]) 04/24/2020  . Malnutrition of moderate degree 04/24/2020  . History of drug abuse ([place]) 10/18/2017  . Tobacco abuse   . Diastolic heart failure ([place]) 02/05/2015  . Healthcare maintenance 05/28/2014  . Hyperlipidemia 03/10/2014  . [place] OCCLUSION 06/03/2010  . PARANOID SCHIZOPHRENIA, CHRONIC 01/17/2009  . HYPERTENSION, BENIGN ESSENTIAL 01/17/2009  . BPH (benign prostatic hyperplasia) 01/17/2009  . History of hepatitis C 12/14/2008  . DEPRESSION 12/14/2008    Past Surgical History:  Procedure Laterality Date  . CIRCUMCISION  1959  . ORIF TOE FRACTURE Right 04/30/2020   Procedure: OPEN REDUCTION INTERNAL FIXATION (ORIF) RIGHT FOOT METATARSAL FRACTURES;  Surgeon: Newt Minion, MD;  Location: [facility];  Service: Orthopedics;  Laterality: Right;  . TONSILLECTOMY         Family History  Problem Relation Age of Onset  . Hypertension Mother   . Diabetes Mother   . Stroke Mother     Social History   Tobacco Use  . Smoking status: Current Every Day Smoker    Packs/day: 0.50    Years: 48.00    Pack years: 24.00    Types: Cigarettes  . Smokeless tobacco: Never Used  Substance Use Topics  . Alcohol use: Yes  . Drug use: No    Home Medications Prior to Admission medications   Medication Sig Start Date End Date Taking? Authorizing Provider  acetaminophen (TYLENOL) 325 MG tablet Take 2 tablets (650 mg total) by mouth every 6 (six) hours as needed for mild pain, moderate pain or headache. 05/09/20   Mullis, Kiersten P, DO  amLODipine (NORVASC) 10 MG tablet Take 1 tablet (10 mg total) by mouth daily. 07/08/20   Alcus Dad, MD  benztropine (COGENTIN) 1 MG tablet Take 1 tablet (1 mg total) by mouth 2 (two) times daily. Patient taking differently: Take 1 mg by mouth 2 (two) times daily. For tardive dyskinesia 02/26/20   Salley Slaughter, NP  cetirizine (ZYRTEC ALLERGY) 10 MG tablet  Take 1 tablet (10 mg total) by mouth daily. 02/01/20   [name], [name], PA-C  famotidine (PEPCID) 20 MG tablet Take 1 tablet (20 mg total) by mouth 2 (two) times daily. Patient taking differently: Take 20 mg by mouth every morning. 07/08/20   Alcus Dad, MD  fluPHENAZine decanoate (PROLIXIN) 25 MG/ML injection Inject 1 mL (25 mg total) into the muscle every 14 (fourteen) days. Last dose 07/08/2020. Next dose due 07/22/2020. Patient taking differently: Inject 25 mg into the muscle every 14 (fourteen) days. 07/08/20   Alcus Dad, MD  folic acid (FOLVITE) 1 MG tablet Take 1 tablet (1 mg total) by mouth daily. 05/09/20   Mullis, Kiersten P, DO  hydrOXYzine (ATARAX/VISTARIL) 25 MG tablet Take 1 tablet (25 mg total) by mouth at bedtime as needed for anxiety (Anxiety, Insomnia). 06/26/20   Wilber Oliphant, MD  Multiple Vitamin (MULTIVITAMIN WITH MINERALS) TABS tablet Take 1 tablet by mouth daily. 06/28/14   Bacigalupo, Dionne Bucy, MD  ramelteon (ROZEREM) 8 MG tablet Take 1 tablet (8 mg total) by mouth at bedtime. 06/26/20   Wilber Oliphant, MD  tamsulosin (FLOMAX) 0.4 MG CAPS capsule TAKE 2 CAPSULE BY MOUTH DAILY Patient taking differently: Take 0.8 mg by mouth every morning. 02/23/20   Mullis, Kiersten P, DO  thiamine 100 MG tablet Take 1 tablet (100 mg total) by mouth daily. 05/09/20   Mullis, Kiersten P, DO  traZODone (DESYREL) 50 MG tablet Take 1 tablet (50 mg total) by mouth at bedtime. 06/26/20   Wilber Oliphant, MD  vitamin C (ASCORBIC ACID) 500 MG tablet Take 500 mg by mouth daily.     [provider]  loratadine (CLARITIN) 10 MG tablet Take 1 tablet (10 mg total) by mouth daily. 12/28/19 02/05/20  [name], DO    Allergies    Lisinopril, Penicillins, Amoxicillin, and Ibuprofen  Review of Systems   Review of Systems  Neurological: Positive for seizures.  All other systems reviewed and are negative.   Physical Exam Updated Vital Signs BP 132/76 (BP Location: Right Arm)   Pulse 75    Temp 98.1 F (36.7 C) (Oral)   Resp 14   Ht 5\' 9"  (1.753 m)   Wt 65.8 kg   SpO2 100%   BMI 21.41 kg/m   Physical Exam Vitals and nursing note reviewed.  Constitutional:      Appearance: Normal appearance.  HENT:     Head: Normocephalic and atraumatic.     Right Ear: External ear normal.     Left Ear: External ear normal.     Nose: Nose normal.     Mouth/Throat:     Mouth: Mucous membranes are moist.  Pharynx: Oropharynx is clear.  Eyes:     Extraocular Movements: Extraocular movements intact.     Conjunctiva/sclera: Conjunctivae normal.     Pupils: Pupils are equal, round, and reactive to light.  Cardiovascular:     Rate and Rhythm: Normal rate and regular rhythm.     Pulses: Normal pulses.     Heart sounds: Normal heart sounds.  Pulmonary:     Effort: Pulmonary effort is normal.     Breath sounds: Normal breath sounds.  Abdominal:     General: Abdomen is flat. Bowel sounds are normal.     Palpations: Abdomen is soft.  Musculoskeletal:        General: Normal range of motion.     Cervical back: Normal range of motion and neck supple.  Skin:    General: Skin is warm.     Capillary Refill: Capillary refill takes less than 2 seconds.  Neurological:     Mental Status: He is alert.     Comments: Pt is following simple commands.  He is moving all 4 extremities and is helping the NA get himself undressed.  He has difficult speech to understand, but says he wants to go home.  Psychiatric:        Behavior: Behavior is cooperative.     ED Results / Procedures / Treatments   Labs (all labs ordered are listed, but only abnormal results are displayed) Labs Reviewed  CBC WITH DIFFERENTIAL/PLATELET - Abnormal; Notable for the following components:      Result Value   RBC 3.69 (*)    Hemoglobin 10.7 (*)    HCT 33.2 (*)    All other components within normal limits  COMPREHENSIVE METABOLIC PANEL - Abnormal; Notable for the following components:   Glucose, Bld 102 (*)     BUN 28 (*)    Creatinine, Ser 1.74 (*)    GFR, Estimated 42 (*)    All other components within normal limits  LACTIC ACID, PLASMA - Abnormal; Notable for the following components:   Lactic Acid, Venous 2.2 (*)    All other components within normal limits  URINE CULTURE  CULTURE, BLOOD (ROUTINE X 2)  CULTURE, BLOOD (ROUTINE X 2)  MAGNESIUM  URINALYSIS, ROUTINE W REFLEX MICROSCOPIC  RAPID URINE DRUG SCREEN, HOSP PERFORMED  PROTIME-INR  APTT  LACTIC ACID, PLASMA  CBG MONITORING, ED    EKG EKG Interpretation  Date/Time:  Saturday November 09 2020 11:59:38 EDT Ventricular Rate:  119 PR Interval:    QRS Duration: 88 QT Interval:  307 QTC Calculation: 432 R Axis:   77 Text Interpretation: Junctional tachycardia Borderline T abnormalities, inferior leads Since last tracing rate faster Confirmed by Isla Pence [phone]) on 11/09/2020 12:07:43 PM   Radiology DG Chest Port 1 View  Result Date: 11/09/2020 CLINICAL DATA:  Altered mental status, possible seizure EXAM: PORTABLE CHEST 1 VIEW COMPARISON:  Portable exam 1224 hours compared to 10/28/2020 FINDINGS: Upper normal heart size. Mediastinal contours and pulmonary vascularity normal. Hypoinflated lung bases with bibasilar atelectasis. Questionable nodular density at RIGHT base, corresponds to an incomplete costal bar on an earlier chest radiograph of 05/12/2020. No pleural effusion or pneumothorax. Osseous structures unremarkable. IMPRESSION: Bibasilar atelectasis. Electronically Signed   By: Lavonia Dana M.D.   On: 11/09/2020 12:30    Procedures Procedures   Medications Ordered in ED Medications  0.9 %  sodium chloride infusion ( Intravenous Stopped 11/09/20 1559)  sodium chloride 0.9 % bolus 1,000 mL (0 mLs Intravenous Stopped 11/09/20 1607)  ED Course  I have reviewed the triage vital signs and the nursing notes.  Pertinent labs & imaging results that were available during my care of the patient were reviewed by me and  considered in my medical decision making (see chart for details).    MDM Rules/Calculators/A&P                          Pt is feeling much better after IVFs.  BP is much better after IVFs.  Lactic is elevated and he has some mild renal insufficiency.  I suspect that is from dehydration.  Pt is stable for d/c.  Return if worse.  Final Clinical Impression(s) / ED Diagnoses Final diagnoses:  Dehydration    Rx / DC Orders ED Discharge Orders    None       Isla Pence, MD 11/09/20 1615

## 2020-11-09 NOTE — ED Notes (Signed)
Pt was given socks to hold in both hands, to redirect from grabbing monitoring equipment or gown.

## 2020-11-10 LAB — URINE CULTURE

## 2020-11-14 LAB — CULTURE, BLOOD (ROUTINE X 2)
Culture: NO GROWTH
Culture: NO GROWTH
Special Requests: ADEQUATE

## 2020-11-27 ENCOUNTER — Inpatient Hospital Stay (HOSPITAL_COMMUNITY)
Admission: EM | Admit: 2020-11-27 | Discharge: 2020-12-02 | DRG: 690 | Disposition: A | Discharge status: Skilled Nursing Facility | Payer: Medicare Other | Source: Skilled Nursing Facility | Attending: Family Medicine | Admitting: Family Medicine

## 2020-11-27 ENCOUNTER — Encounter (HOSPITAL_COMMUNITY): Payer: Self-pay | Admitting: Emergency Medicine

## 2020-11-27 ENCOUNTER — Other Ambulatory Visit: Payer: Self-pay

## 2020-11-27 DIAGNOSIS — N400 Enlarged prostate without lower urinary tract symptoms: Secondary | ICD-10-CM | POA: Diagnosis present

## 2020-11-27 DIAGNOSIS — Z8719 Personal history of other diseases of the digestive system: Secondary | ICD-10-CM

## 2020-11-27 DIAGNOSIS — F17210 Nicotine dependence, cigarettes, uncomplicated: Secondary | ICD-10-CM | POA: Diagnosis present

## 2020-11-27 DIAGNOSIS — D696 Thrombocytopenia, unspecified: Secondary | ICD-10-CM | POA: Diagnosis present

## 2020-11-27 DIAGNOSIS — Z20822 Contact with and (suspected) exposure to covid-19: Secondary | ICD-10-CM | POA: Diagnosis present

## 2020-11-27 DIAGNOSIS — Z833 Family history of diabetes mellitus: Secondary | ICD-10-CM | POA: Diagnosis not present

## 2020-11-27 DIAGNOSIS — K219 Gastro-esophageal reflux disease without esophagitis: Secondary | ICD-10-CM | POA: Diagnosis present

## 2020-11-27 DIAGNOSIS — Z79899 Other long term (current) drug therapy: Secondary | ICD-10-CM | POA: Diagnosis not present

## 2020-11-27 DIAGNOSIS — R471 Dysarthria and anarthria: Secondary | ICD-10-CM | POA: Diagnosis present

## 2020-11-27 DIAGNOSIS — E785 Hyperlipidemia, unspecified: Secondary | ICD-10-CM | POA: Diagnosis present

## 2020-11-27 DIAGNOSIS — G9340 Encephalopathy, unspecified: Secondary | ICD-10-CM | POA: Diagnosis not present

## 2020-11-27 DIAGNOSIS — N1831 Chronic kidney disease, stage 3a: Secondary | ICD-10-CM | POA: Diagnosis present

## 2020-11-27 DIAGNOSIS — Z823 Family history of stroke: Secondary | ICD-10-CM | POA: Diagnosis not present

## 2020-11-27 DIAGNOSIS — F203 Undifferentiated schizophrenia: Secondary | ICD-10-CM | POA: Diagnosis not present

## 2020-11-27 DIAGNOSIS — Z888 Allergy status to other drugs, medicaments and biological substances status: Secondary | ICD-10-CM | POA: Diagnosis not present

## 2020-11-27 DIAGNOSIS — R4182 Altered mental status, unspecified: Secondary | ICD-10-CM | POA: Diagnosis present

## 2020-11-27 DIAGNOSIS — I10 Essential (primary) hypertension: Secondary | ICD-10-CM | POA: Diagnosis not present

## 2020-11-27 DIAGNOSIS — Z886 Allergy status to analgesic agent status: Secondary | ICD-10-CM | POA: Diagnosis not present

## 2020-11-27 DIAGNOSIS — Z87440 Personal history of urinary (tract) infections: Secondary | ICD-10-CM

## 2020-11-27 DIAGNOSIS — D649 Anemia, unspecified: Secondary | ICD-10-CM | POA: Diagnosis present

## 2020-11-27 DIAGNOSIS — E7800 Pure hypercholesterolemia, unspecified: Secondary | ICD-10-CM | POA: Diagnosis present

## 2020-11-27 DIAGNOSIS — B9620 Unspecified Escherichia coli [E. coli] as the cause of diseases classified elsewhere: Secondary | ICD-10-CM | POA: Diagnosis present

## 2020-11-27 DIAGNOSIS — Z8249 Family history of ischemic heart disease and other diseases of the circulatory system: Secondary | ICD-10-CM

## 2020-11-27 DIAGNOSIS — G4700 Insomnia, unspecified: Secondary | ICD-10-CM | POA: Diagnosis present

## 2020-11-27 DIAGNOSIS — F209 Schizophrenia, unspecified: Secondary | ICD-10-CM | POA: Diagnosis present

## 2020-11-27 DIAGNOSIS — Z880 Allergy status to penicillin: Secondary | ICD-10-CM

## 2020-11-27 DIAGNOSIS — I129 Hypertensive chronic kidney disease with stage 1 through stage 4 chronic kidney disease, or unspecified chronic kidney disease: Secondary | ICD-10-CM | POA: Diagnosis present

## 2020-11-27 DIAGNOSIS — R456 Violent behavior: Secondary | ICD-10-CM | POA: Diagnosis present

## 2020-11-27 DIAGNOSIS — N390 Urinary tract infection, site not specified: Principal | ICD-10-CM | POA: Diagnosis present

## 2020-11-27 DIAGNOSIS — F200 Paranoid schizophrenia: Secondary | ICD-10-CM | POA: Diagnosis present

## 2020-11-27 DIAGNOSIS — Z8619 Personal history of other infectious and parasitic diseases: Secondary | ICD-10-CM

## 2020-11-27 DIAGNOSIS — N179 Acute kidney failure, unspecified: Secondary | ICD-10-CM | POA: Diagnosis present

## 2020-11-27 DIAGNOSIS — R4689 Other symptoms and signs involving appearance and behavior: Secondary | ICD-10-CM

## 2020-11-27 LAB — COMPREHENSIVE METABOLIC PANEL
ALT: 17 U/L (ref 0–44)
AST: 27 U/L (ref 15–41)
Albumin: 4 g/dL (ref 3.5–5.0)
Alkaline Phosphatase: 45 U/L (ref 38–126)
Anion gap: 8 (ref 5–15)
BUN: 35 mg/dL — ABNORMAL HIGH (ref 8–23)
CO2: 27 mmol/L (ref 22–32)
Calcium: 8.8 mg/dL — ABNORMAL LOW (ref 8.9–10.3)
Chloride: 109 mmol/L (ref 98–111)
Creatinine, Ser: 1.59 mg/dL — ABNORMAL HIGH (ref 0.61–1.24)
GFR, Estimated: 47 mL/min — ABNORMAL LOW (ref >60)
Glucose, Bld: 135 mg/dL — ABNORMAL HIGH (ref 70–99)
Potassium: 4 mmol/L (ref 3.5–5.1)
Sodium: 144 mmol/L (ref 135–145)
Total Bilirubin: 0.7 mg/dL (ref 0.3–1.2)
Total Protein: 7.2 g/dL (ref 6.5–8.1)

## 2020-11-27 LAB — URINALYSIS, ROUTINE W REFLEX MICROSCOPIC
Bilirubin Urine: NEGATIVE
Glucose, UA: NEGATIVE mg/dL
Hgb urine dipstick: NEGATIVE
Ketones, ur: NEGATIVE mg/dL
Nitrite: NEGATIVE
Protein, ur: NEGATIVE mg/dL
Specific Gravity, Urine: 1.015 (ref 1.005–1.030)
WBC, UA: >50 WBC/hpf — ABNORMAL HIGH (ref 0–5)
pH: 5 (ref 5.0–8.0)

## 2020-11-27 LAB — RESP PANEL BY RT-PCR (FLU A&B, COVID) ARPGX2
Influenza A by PCR: NEGATIVE
Influenza B by PCR: NEGATIVE
SARS Coronavirus 2 by RT PCR: NEGATIVE

## 2020-11-27 LAB — ETHANOL: Alcohol, Ethyl (B): <10 mg/dL (ref <10)

## 2020-11-27 LAB — CBC WITH DIFFERENTIAL/PLATELET
Abs Immature Granulocytes: 0.01 10*3/uL (ref 0.00–0.07)
Basophils Absolute: 0 10*3/uL (ref 0.0–0.1)
Basophils Relative: 0 %
Eosinophils Absolute: 0.2 10*3/uL (ref 0.0–0.5)
Eosinophils Relative: 3 %
HCT: 32.5 % — ABNORMAL LOW (ref 39.0–52.0)
Hemoglobin: 10.5 g/dL — ABNORMAL LOW (ref 13.0–17.0)
Immature Granulocytes: 0 %
Lymphocytes Relative: 29 %
Lymphs Abs: 1.3 10*3/uL (ref 0.7–4.0)
MCH: 29.4 pg (ref 26.0–34.0)
MCHC: 32.3 g/dL (ref 30.0–36.0)
MCV: 91 fL (ref 80.0–100.0)
Monocytes Absolute: 0.4 10*3/uL (ref 0.1–1.0)
Monocytes Relative: 8 %
Neutro Abs: 2.8 10*3/uL (ref 1.7–7.7)
Neutrophils Relative %: 60 %
Platelets: 104 10*3/uL — ABNORMAL LOW (ref 150–400)
RBC: 3.57 MIL/uL — ABNORMAL LOW (ref 4.22–5.81)
RDW: 15.9 % — ABNORMAL HIGH (ref 11.5–15.5)
WBC: 4.7 10*3/uL (ref 4.0–10.5)
nRBC: 0 % (ref 0.0–0.2)

## 2020-11-27 LAB — RAPID URINE DRUG SCREEN, HOSP PERFORMED
Amphetamines: NOT DETECTED
Barbiturates: NOT DETECTED
Benzodiazepines: NOT DETECTED
Cocaine: NOT DETECTED
Opiates: NOT DETECTED
Tetrahydrocannabinol: NOT DETECTED

## 2020-11-27 MED ORDER — SENNOSIDES-DOCUSATE SODIUM 8.6-50 MG PO TABS: 1.0000 | ORAL_TABLET | Freq: Every evening | ORAL | Status: DC | PRN

## 2020-11-27 MED ORDER — HYDROXYZINE HCL 25 MG PO TABS: 25.0000 mg | ORAL_TABLET | Freq: Every evening | ORAL | Status: DC | PRN

## 2020-11-27 MED ORDER — ADULT MULTIVITAMIN W/MINERALS CH: 1.0000 | ORAL_TABLET | Freq: Every day | ORAL | Status: DC

## 2020-11-27 MED ORDER — FAMOTIDINE 20 MG PO TABS: 20.0000 mg | ORAL_TABLET | Freq: Two times a day (BID) | ORAL | Status: DC

## 2020-11-27 MED ORDER — ASCORBIC ACID 500 MG PO TABS: 500.0000 mg | ORAL_TABLET | Freq: Every day | ORAL | Status: DC

## 2020-11-27 MED ORDER — LORATADINE 10 MG PO TABS: 10.0000 mg | ORAL_TABLET | Freq: Every day | ORAL | Status: DC

## 2020-11-27 MED ORDER — TRAZODONE HCL 100 MG PO TABS
50.0000 mg | ORAL_TABLET | Freq: Every day | ORAL | Status: DC
  Filled 2020-11-27: qty 1

## 2020-11-27 MED ORDER — ACETAMINOPHEN 650 MG RE SUPP: 650.0000 mg | Freq: Four times a day (QID) | RECTAL | Status: DC | PRN

## 2020-11-27 MED ORDER — TAMSULOSIN HCL 0.4 MG PO CAPS: 0.8000 mg | ORAL_CAPSULE | Freq: Every morning | ORAL | Status: DC

## 2020-11-27 MED ORDER — ACETAMINOPHEN 325 MG PO TABS: 650.0000 mg | ORAL_TABLET | Freq: Four times a day (QID) | ORAL | Status: DC | PRN

## 2020-11-27 MED ORDER — THIAMINE HCL 100 MG PO TABS: 100.0000 mg | ORAL_TABLET | Freq: Every day | ORAL | Status: DC

## 2020-11-27 MED ORDER — AMLODIPINE BESYLATE 5 MG PO TABS: 10.0000 mg | ORAL_TABLET | Freq: Every day | ORAL | Status: DC

## 2020-11-27 MED ORDER — BENZTROPINE MESYLATE 1 MG PO TABS: 1.0000 mg | ORAL_TABLET | Freq: Two times a day (BID) | ORAL | Status: DC

## 2020-11-27 MED ORDER — ENOXAPARIN SODIUM 40 MG/0.4ML ~~LOC~~ SOLN
40.0000 mg | SUBCUTANEOUS | Status: DC
  Administered 2020-11-28 – 2020-12-01 (×4): 40 mg via SUBCUTANEOUS
  Filled 2020-11-27 (×6): qty 0.4

## 2020-11-27 MED ORDER — RAMELTEON 8 MG PO TABS
8.0000 mg | ORAL_TABLET | Freq: Every day | ORAL | Status: DC
  Filled 2020-11-27: qty 1

## 2020-11-27 MED ORDER — SODIUM CHLORIDE 0.9 % IV SOLN
1.0000 g | Freq: Once | INTRAVENOUS | Status: AC
  Administered 2020-11-28: 1 g via INTRAVENOUS
  Filled 2020-11-27: qty 1
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
## 2020-11-27 NOTE — Progress Notes (Signed)
A consult was received from an ED physician for Meropenem per pharmacy dosing.  The patient's profile has been reviewed for ht/wt/allergies/indication/available labs.   A one time order has been placed for Meropenem 1gm IV x1.  Further antibiotics/pharmacy consults should be ordered by admitting physician if indicated.                       Thank you, Netta Cedars PharmD 11/27/2020  11:25 PM
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
## 2020-11-27 NOTE — H&P (Signed)
History and Physical    [name] JME:[id number] DOB: [birth year] DOA: 11/27/2020  PCP: Danna Hefty, DO   Patient coming from: SNF   Chief Complaint: Aggressive behavior   HPI: [name] is a 70 y.o. male with medical history significant for schizophrenia, dementia, chronic kidney disease stage IIIa, recent history of ESBL UTI, now presenting from his nursing facility for evaluation of aggressive behavior.  At his baseline, patient is reportedly calm, cooperative, able to state his name, but severely dysarthric and difficult to understand.  He became aggressive today with some of the other residents which is reported to be uncharacteristic for him and similar to behavior he exhibited in the past when he had an infection.  He was sent to the ED for further evaluation of this.  Patient denies any pain, appears comfortable, but unable to provide much history.  ED Course: Upon arrival to the ED, patient is found to be afebrile, saturating well on room air, and with stable blood pressure.  EKG features sinus rhythm with right axis deviation.  Chemistry panel notable for creatinine 1.59.  CBC features a stable normocytic anemia and thrombocytopenia with platelets 104,000.  Blood and urine cultures were collected in the emergency department and the patient was started on meropenem.  Review of Systems:  ROS limited by patient's clinical condition.  Past Medical History:  Diagnosis Date  . Acute encephalopathy 04/23/2020  . Acute metabolic encephalopathy 09/18/9796  . AKI (acute kidney injury) ([place]) 05/12/2020  . BPH (benign prostatic hyperplasia)   . Closed displaced fracture of fifth metatarsal bone of right foot   . Colon polyp 2010  . Crush injury   . Dementia ([place])   . Depression   . GERD (gastroesophageal reflux disease)   . Hepatitis C    "caught it when I had a blood transfusion"  . High cholesterol   . History of blood transfusion    "when I was young"  . Hypertension   .  Paranoid schizophrenia ([place])   . PARANOID SCHIZOPHRENIA, CHRONIC 01/17/2009  . Prostate atrophy   . Retinal vein occlusion     Past Surgical History:  Procedure Laterality Date  . CIRCUMCISION  1959  . ORIF TOE FRACTURE Right 04/30/2020   Procedure: OPEN REDUCTION INTERNAL FIXATION (ORIF) RIGHT FOOT METATARSAL FRACTURES;  Surgeon: Newt Minion, MD;  Location: [facility];  Service: Orthopedics;  Laterality: Right;  . TONSILLECTOMY      Social History:   reports that he has been smoking cigarettes. He has a 24.00 pack-year smoking history. He has never used smokeless tobacco. He reports current alcohol use. He reports that he does not use drugs.  Allergies  Allergen Reactions  . Lisinopril Cough  . Penicillins Hives  . Amoxicillin Rash  . Ibuprofen Rash    Family History  Problem Relation Age of Onset  . Hypertension Mother   . Diabetes Mother   . Stroke Mother      Prior to Admission medications   Medication Sig Start Date End Date Taking? Authorizing Provider  acetaminophen (TYLENOL) 325 MG tablet Take 2 tablets (650 mg total) by mouth every 6 (six) hours as needed for mild pain, moderate pain or headache. 05/09/20  Yes Mullis, Kiersten P, DO  amLODipine (NORVASC) 10 MG tablet Take 1 tablet (10 mg total) by mouth daily. 07/08/20  Yes Alcus Dad, MD  benztropine (COGENTIN) 1 MG tablet Take 1 tablet (1 mg total) by mouth 2 (two) times daily. Patient taking  differently: Take 1 mg by mouth 2 (two) times daily. For tardive dyskinesia 02/26/20  Yes Eulis Canner E, NP  cetirizine (ZYRTEC ALLERGY) 10 MG tablet Take 1 tablet (10 mg total) by mouth daily. 02/01/20  Yes [name], [name], PA-C  famotidine (PEPCID) 20 MG tablet Take 1 tablet (20 mg total) by mouth 2 (two) times daily. 07/08/20  Yes Alcus Dad, MD  fluPHENAZine decanoate (PROLIXIN) 25 MG/ML injection Inject 1 mL (25 mg total) into the muscle every 14 (fourteen) days. Last dose 07/08/2020. Next dose due  07/22/2020. Patient taking differently: Inject 25 mg into the muscle every 14 (fourteen) days. 07/08/20  Yes Alcus Dad, MD  hydrOXYzine (ATARAX/VISTARIL) 25 MG tablet Take 1 tablet (25 mg total) by mouth at bedtime as needed for anxiety (Anxiety, Insomnia). 06/26/20  Yes Wilber Oliphant, MD  Multiple Vitamin (MULTIVITAMIN WITH MINERALS) TABS tablet Take 1 tablet by mouth daily. 06/28/14  Yes Bacigalupo, Dionne Bucy, MD  ramelteon (ROZEREM) 8 MG tablet Take 1 tablet (8 mg total) by mouth at bedtime. 06/26/20  Yes Wilber Oliphant, MD  tamsulosin (FLOMAX) 0.4 MG CAPS capsule TAKE 2 CAPSULE BY MOUTH DAILY Patient taking differently: Take 0.8 mg by mouth every morning. 02/23/20  Yes Mullis, Kiersten P, DO  thiamine 100 MG tablet Take 1 tablet (100 mg total) by mouth daily. 05/09/20  Yes Mullis, Kiersten P, DO  traZODone (DESYREL) 50 MG tablet Take 1 tablet (50 mg total) by mouth at bedtime. 06/26/20  Yes Wilber Oliphant, MD  vitamin C (ASCORBIC ACID) 500 MG tablet Take 500 mg by mouth daily.    Yes [provider]  folic acid (FOLVITE) 1 MG tablet Take 1 tablet (1 mg total) by mouth daily. Patient not taking: No sig reported 05/09/20   Mullis, Kiersten P, DO  loratadine (CLARITIN) 10 MG tablet Take 1 tablet (10 mg total) by mouth daily. 12/28/19 02/05/20  [name], DO    Physical Exam: Vitals:   11/27/20 2306 11/27/20 2330 11/28/20 0000 11/28/20 0015  BP: 137/74 (!) 143/76 140/82 (!) 146/95  Pulse: (!) 57 61 (!) 55 (!) 59  Resp: 14 12 13 15   Temp: 98 F (36.7 C)     TempSrc: Oral     SpO2: 100% 100% 99% 100%    Constitutional: NAD, calm  Eyes: PERTLA, lids and conjunctivae normal ENMT: Mucous membranes are moist. Posterior pharynx clear of any exudate or lesions.   Neck: normal, supple, no masses, no thyromegaly Respiratory: no wheezing, no crackles. No accessory muscle use.  Cardiovascular: S1 & S2 heard, regular rate and rhythm. No extremity edema.   Abdomen: No distension, no  tenderness, soft. Bowel sounds active.  Musculoskeletal: no clubbing / cyanosis. No joint deformity upper and lower extremities.   Skin: no significant rashes, lesions, ulcers. Warm, dry, well-perfused. Neurologic: No gross facial asymmetry. Dysarthria. Sensation intact. Moving all extremities.  Psychiatric: Alert, states name and answers some yes/no questions. Calm, cooperative.    Labs and Imaging on Admission: I have personally reviewed following labs and imaging studies  CBC: Recent Labs  Lab 11/27/20 2011  WBC 4.7  NEUTROABS 2.8  HGB 10.5*  HCT 32.5*  MCV 91.0  PLT 409*   Basic Metabolic Panel: Recent Labs  Lab 11/27/20 2011  NA 144  K 4.0  CL 109  CO2 27  GLUCOSE 135*  BUN 35*  CREATININE 1.59*  CALCIUM 8.8*   GFR: CrCl cannot be calculated (Unknown ideal weight.). Liver Function Tests: Recent  Labs  Lab 11/27/20 2011  AST 27  ALT 17  ALKPHOS 45  BILITOT 0.7  PROT 7.2  ALBUMIN 4.0   No results for input(s): LIPASE, AMYLASE in the last 168 hours. No results for input(s): AMMONIA in the last 168 hours. Coagulation Profile: No results for input(s): INR, PROTIME in the last 168 hours. Cardiac Enzymes: No results for input(s): CKTOTAL, CKMB, CKMBINDEX, TROPONINI in the last 168 hours. BNP (last 3 results) No results for input(s): PROBNP in the last 8760 hours. HbA1C: No results for input(s): HGBA1C in the last 72 hours. CBG: No results for input(s): GLUCAP in the last 168 hours. Lipid Profile: No results for input(s): CHOL, HDL, LDLCALC, TRIG, CHOLHDL, LDLDIRECT in the last 72 hours. Thyroid Function Tests: No results for input(s): TSH, T4TOTAL, FREET4, T3FREE, THYROIDAB in the last 72 hours. Anemia Panel: No results for input(s): VITAMINB12, FOLATE, FERRITIN, TIBC, IRON, RETICCTPCT in the last 72 hours. Urine analysis:    Component Value Date/Time   COLORURINE YELLOW 11/27/2020 2143   APPEARANCEUR CLOUDY (A) 11/27/2020 2143   LABSPEC 1.015  11/27/2020 2143   PHURINE 5.0 11/27/2020 2143   GLUCOSEU NEGATIVE 11/27/2020 2143   HGBUR NEGATIVE 11/27/2020 2143   HGBUR negative 08/27/2010 0854   BILIRUBINUR NEGATIVE 11/27/2020 2143   BILIRUBINUR negative 08/02/2018 1555   BILIRUBINUR NEG 07/27/2014 1518   KETONESUR NEGATIVE 11/27/2020 2143   PROTEINUR NEGATIVE 11/27/2020 2143   UROBILINOGEN 0.2 08/02/2018 1555   UROBILINOGEN 0.2 08/27/2010 0854   NITRITE NEGATIVE 11/27/2020 2143   LEUKOCYTESUR LARGE (A) 11/27/2020 2143   Sepsis Labs: @LABRCNTIP (procalcitonin:4,lacticidven:4) ) Recent Results (from the past 240 hour(s))  Resp Panel by RT-PCR (Flu A&B, Covid) Nasopharyngeal Swab     Status: None   Collection Time: 11/27/20  8:11 PM   Specimen: Nasopharyngeal Swab; Nasopharyngeal(NP) swabs in vial transport medium  Result Value Ref Range Status   SARS Coronavirus 2 by RT PCR NEGATIVE NEGATIVE Final    Comment: (NOTE) SARS-CoV-2 target nucleic acids are NOT DETECTED.  The SARS-CoV-2 RNA is generally detectable in upper respiratory specimens during the acute phase of infection. The lowest concentration of SARS-CoV-2 viral copies this assay can detect is 138 copies/mL. A negative result does not preclude SARS-Cov-2 infection and should not be used as the sole basis for treatment or other patient management decisions. A negative result may occur with  improper specimen collection/handling, submission of specimen other than nasopharyngeal swab, presence of viral mutation(s) within the areas targeted by this assay, and inadequate number of viral copies(<138 copies/mL). A negative result must be combined with clinical observations, patient history, and epidemiological information. The expected result is Negative.  Fact Sheet for Patients:  EntrepreneurPulse.com.au  Fact Sheet for Healthcare Providers:  IncredibleEmployment.be  This test is no t yet approved or cleared by the [place]  FDA and  has been authorized for detection and/or diagnosis of SARS-CoV-2 by FDA under an Emergency Use Authorization (EUA). This EUA will remain  in effect (meaning this test can be used) for the duration of the COVID-19 declaration under Section 564(b)(1) of the Act, 21 U.S.C.section 360bbb-3(b)(1), unless the authorization is terminated  or revoked sooner.       Influenza A by PCR NEGATIVE NEGATIVE Final   Influenza B by PCR NEGATIVE NEGATIVE Final    Comment: (NOTE) The Xpert Xpress SARS-CoV-2/FLU/RSV plus assay is intended as an aid in the diagnosis of influenza from Nasopharyngeal swab specimens and should not be used as a sole basis for treatment.  Nasal washings and aspirates are unacceptable for Xpert Xpress SARS-CoV-2/FLU/RSV testing.  Fact Sheet for Patients: EntrepreneurPulse.com.au  Fact Sheet for Healthcare Providers: IncredibleEmployment.be  This test is not yet approved or cleared by the [place] FDA and has been authorized for detection and/or diagnosis of SARS-CoV-2 by FDA under an Emergency Use Authorization (EUA). This EUA will remain in effect (meaning this test can be used) for the duration of the COVID-19 declaration under Section 564(b)(1) of the Act, 21 U.S.C. section 360bbb-3(b)(1), unless the authorization is terminated or revoked.  Performed at [facility], [place] [address] [place], [place] [address]      Radiological Exams on Admission: No results found.  EKG: Independently reviewed. Sinus rhythm, RAD, rate 55, QTc 415.   Assessment/Plan   1. UTI; history of ESBL infection - Presents with change in behavior similar to when he has had infection, has UA compatible with possible infection, hx of recent ESBL UTI  - Not septic on admission  - Cultured in ED and started on meropenem  - Continue meropenem while monitoring cultures and clinical course   2. Aggression; schizophrenia  -  Patient had an episode of aggression at SNF today which is reportedly uncharacteristic for him; he is calm and cooperative in ED and seems to be at baseline, able to state name and answer some yes/no questions, severely dysarthric  - Managed with long-acting fluphenazine and as-needed Vistaril    3. CKD IIIa  - SCr is 1.59 on admission, similar to priors  - Renally-dose medications, monitor    4. Thrombocytopenia  - Platelets 104k on admission  - Intermittently low going back many years, will monitor     DVT prophylaxis: Lovenox  Code Status: Full   Level of Care: Level of care: Med-Surg Family Communication: None available at time of admission  Disposition Plan:  Patient is from: SNF  Anticipated d/c is to: SNF  Anticipated d/c date is: 12/01/20 Patient currently: pending culture results  Consults called: none  Admission status: Inpatient     Vianne Bulls, MD [facility]  11/28/2020, 12:32 AM

## 2020-11-27 NOTE — ED Notes (Signed)
Patient moved to room 5. Bed alarm in place.

## 2020-11-28 ENCOUNTER — Other Ambulatory Visit: Payer: Self-pay

## 2020-11-28 DIAGNOSIS — F203 Undifferentiated schizophrenia: Secondary | ICD-10-CM | POA: Diagnosis not present

## 2020-11-28 DIAGNOSIS — N1831 Chronic kidney disease, stage 3a: Secondary | ICD-10-CM | POA: Diagnosis not present

## 2020-11-28 DIAGNOSIS — R4182 Altered mental status, unspecified: Secondary | ICD-10-CM | POA: Diagnosis not present

## 2020-11-28 DIAGNOSIS — N390 Urinary tract infection, site not specified: Principal | ICD-10-CM

## 2020-11-28 DIAGNOSIS — R4689 Other symptoms and signs involving appearance and behavior: Secondary | ICD-10-CM | POA: Diagnosis not present

## 2020-11-28 LAB — BASIC METABOLIC PANEL
Anion gap: 7 (ref 5–15)
BUN: 27 mg/dL — ABNORMAL HIGH (ref 8–23)
CO2: 29 mmol/L (ref 22–32)
Calcium: 8.9 mg/dL (ref 8.9–10.3)
Chloride: 104 mmol/L (ref 98–111)
Creatinine, Ser: 1.46 mg/dL — ABNORMAL HIGH (ref 0.61–1.24)
GFR, Estimated: 52 mL/min — ABNORMAL LOW (ref >60)
Glucose, Bld: 94 mg/dL (ref 70–99)
Potassium: 4 mmol/L (ref 3.5–5.1)
Sodium: 140 mmol/L (ref 135–145)

## 2020-11-28 LAB — CBC
HCT: 34.5 % — ABNORMAL LOW (ref 39.0–52.0)
Hemoglobin: 11.5 g/dL — ABNORMAL LOW (ref 13.0–17.0)
MCH: 29.9 pg (ref 26.0–34.0)
MCHC: 33.3 g/dL (ref 30.0–36.0)
MCV: 89.8 fL (ref 80.0–100.0)
Platelets: 111 10*3/uL — ABNORMAL LOW (ref 150–400)
RBC: 3.84 MIL/uL — ABNORMAL LOW (ref 4.22–5.81)
RDW: 15.6 % — ABNORMAL HIGH (ref 11.5–15.5)
WBC: 7.6 10*3/uL (ref 4.0–10.5)
nRBC: 0 % (ref 0.0–0.2)

## 2020-11-28 MED ORDER — RAMELTEON 8 MG PO TABS
8.0000 mg | ORAL_TABLET | Freq: Every day | ORAL | Status: DC
  Administered 2020-11-28 – 2020-12-01 (×4): 8 mg via ORAL
  Filled 2020-11-28 (×5): qty 1

## 2020-11-28 MED ORDER — AMLODIPINE BESYLATE 10 MG PO TABS
10.0000 mg | ORAL_TABLET | Freq: Every day | ORAL | Status: DC
  Administered 2020-11-29 – 2020-12-02 (×4): 10 mg via ORAL
  Filled 2020-11-28 (×4): qty 1

## 2020-11-28 MED ORDER — TRAZODONE HCL 50 MG PO TABS
50.0000 mg | ORAL_TABLET | Freq: Every day | ORAL | Status: DC
  Administered 2020-11-28 – 2020-12-01 (×4): 50 mg via ORAL
  Filled 2020-11-28 (×4): qty 1

## 2020-11-28 MED ORDER — SODIUM CHLORIDE 0.9 % IV SOLN
1.0000 g | Freq: Two times a day (BID) | INTRAVENOUS | Status: DC
  Administered 2020-11-28 – 2020-11-30 (×5): 1 g via INTRAVENOUS
  Filled 2020-11-28: qty 1
  Filled 2020-11-28 (×2): qty 0.17
  Filled 2020-11-28 (×2): qty 1

## 2020-11-28 MED ORDER — TAMSULOSIN HCL 0.4 MG PO CAPS
0.8000 mg | ORAL_CAPSULE | Freq: Every morning | ORAL | Status: DC
  Administered 2020-11-29 – 2020-12-02 (×4): 0.8 mg via ORAL
  Filled 2020-11-28 (×5): qty 2

## 2020-11-28 MED ORDER — BENZTROPINE MESYLATE 0.5 MG PO TABS
1.0000 mg | ORAL_TABLET | Freq: Two times a day (BID) | ORAL | Status: DC
  Administered 2020-11-28 – 2020-12-02 (×8): 1 mg via ORAL
  Filled 2020-11-28 (×8): qty 2

## 2020-11-28 NOTE — Consult Note (Signed)
[facility] Consult   Reason for Consult: 'schizophrenia agitation"  Referring Physician: Dr. Darrick Meigs  Patient Identification: [name] MRN:  [id number] Principal Diagnosis: Acute UTI Diagnosis:  Principal Problem:   Acute UTI Active Problems:   Hypertension   Schizophrenia ([place])   Chronic kidney disease, stage 3a ([place])   Thrombocytopenia ([place])   Total Time spent with patient: 20 minutes  Subjective/Objective: Patient is seen lying in bed, safety sitter at the bedside. He is a poor historian and he is unable to communicate. He clearly has dysarthria and his speech is garbled and nonsensical at baseline. Chart review indicates that he presented from his nursing facility after becoming aggressive with other residents. He was found to have a UTI. Today he is calm, cooperative and appropriate. He does appear to engage and smile appropriately although his severe dysartharia makes it very difficult to understand. He has a history of schizophrenia that appears to be stable at this time, he is currently receiving ACT team services. He is on his current medications. He is not appear to be responding to any internal or external stimuli. He does not appear to be psychotic or delusional.      Past Psychiatric History: Schizophrenia  Risk to Self:  No Risk to Others:   NO Prior Inpatient Therapy:  Unable to recall Prior Outpatient Therapy:   [facility], last visit 05/23/2020  Past Medical History:  Past Medical History:  Diagnosis Date  . Acute encephalopathy 04/23/2020  . Acute metabolic encephalopathy 09/22/7122  . AKI (acute kidney injury) ([place]) 05/12/2020  . BPH (benign prostatic hyperplasia)   . Closed displaced fracture of fifth metatarsal bone of right foot   . Colon polyp 2010  . Crush injury   . Dementia ([place])   . Depression   . GERD (gastroesophageal reflux disease)   . Hepatitis C    "caught it when I had a blood transfusion"  . High  cholesterol   . History of blood transfusion    "when I was young"  . Hypertension   . Paranoid schizophrenia ([place])   . PARANOID SCHIZOPHRENIA, CHRONIC 01/17/2009  . Prostate atrophy   . Retinal vein occlusion     Past Surgical History:  Procedure Laterality Date  . CIRCUMCISION  1959  . ORIF TOE FRACTURE Right 04/30/2020   Procedure: OPEN REDUCTION INTERNAL FIXATION (ORIF) RIGHT FOOT METATARSAL FRACTURES;  Surgeon: Newt Minion, MD;  Location: [facility];  Service: Orthopedics;  Laterality: Right;  . TONSILLECTOMY     Family History:  Family History  Problem Relation Age of Onset  . Hypertension Mother   . Diabetes Mother   . Stroke Mother    Family Psychiatric  History: Denies Social History:  Social History   Substance and Sexual Activity  Alcohol Use Yes     Social History   Substance and Sexual Activity  Drug Use No    Social History   Socioeconomic History  . Marital status: Married    Spouse name: Not on file  . Number of children: Not on file  . Years of education: Not on file  . Highest education level: Not on file  Occupational History  . Not on file  Tobacco Use  . Smoking status: Current Every Day Smoker    Packs/day: 0.50    Years: 48.00    Pack years: 24.00    Types: Cigarettes  . Smokeless tobacco: Never Used  Substance and Sexual Activity  . Alcohol use:  Yes  . Drug use: No  . Sexual activity: Yes  Other Topics Concern  . Not on file  Social History Narrative  . Not on file   Social Determinants of Health   Financial Resource Strain: Not on file  Food Insecurity: Not on file  Transportation Needs: Not on file  Physical Activity: Not on file  Stress: Not on file  Social Connections: Not on file   Additional Social History:    Allergies:   Allergies  Allergen Reactions  . Lisinopril Cough  . Penicillins Hives  . Amoxicillin Rash  . Ibuprofen Rash    Labs:  Results for orders placed or performed during the hospital encounter of  11/27/20 (from the past 48 hour(s))  Resp Panel by RT-PCR (Flu A&B, Covid) Nasopharyngeal Swab     Status: None   Collection Time: 11/27/20  8:11 PM   Specimen: Nasopharyngeal Swab; Nasopharyngeal(NP) swabs in vial transport medium  Result Value Ref Range   SARS Coronavirus 2 by RT PCR NEGATIVE NEGATIVE    Comment: (NOTE) SARS-CoV-2 target nucleic acids are NOT DETECTED.  The SARS-CoV-2 RNA is generally detectable in upper respiratory specimens during the acute phase of infection. The lowest concentration of SARS-CoV-2 viral copies this assay can detect is 138 copies/mL. A negative result does not preclude SARS-Cov-2 infection and should not be used as the sole basis for treatment or other patient management decisions. A negative result may occur with  improper specimen collection/handling, submission of specimen other than nasopharyngeal swab, presence of viral mutation(s) within the areas targeted by this assay, and inadequate number of viral copies(<138 copies/mL). A negative result must be combined with clinical observations, patient history, and epidemiological information. The expected result is Negative.  Fact Sheet for Patients:  EntrepreneurPulse.com.au  Fact Sheet for Healthcare Providers:  IncredibleEmployment.be  This test is no t yet approved or cleared by the [place] FDA and  has been authorized for detection and/or diagnosis of SARS-CoV-2 by FDA under an Emergency Use Authorization (EUA). This EUA will remain  in effect (meaning this test can be used) for the duration of the COVID-19 declaration under Section 564(b)(1) of the Act, 21 U.S.C.section 360bbb-3(b)(1), unless the authorization is terminated  or revoked sooner.       Influenza A by PCR NEGATIVE NEGATIVE   Influenza B by PCR NEGATIVE NEGATIVE    Comment: (NOTE) The Xpert Xpress SARS-CoV-2/FLU/RSV plus assay is intended as an aid in the diagnosis of influenza  from Nasopharyngeal swab specimens and should not be used as a sole basis for treatment. Nasal washings and aspirates are unacceptable for Xpert Xpress SARS-CoV-2/FLU/RSV testing.  Fact Sheet for Patients: EntrepreneurPulse.com.au  Fact Sheet for Healthcare Providers: IncredibleEmployment.be  This test is not yet approved or cleared by the [place] FDA and has been authorized for detection and/or diagnosis of SARS-CoV-2 by FDA under an Emergency Use Authorization (EUA). This EUA will remain in effect (meaning this test can be used) for the duration of the COVID-19 declaration under Section 564(b)(1) of the Act, 21 U.S.C. section 360bbb-3(b)(1), unless the authorization is terminated or revoked.  Performed at [facility], [place] [address] [place], [place] [address]   Comprehensive metabolic panel     Status: Abnormal   Collection Time: 11/27/20  8:11 PM  Result Value Ref Range   Sodium 144 135 - 145 mmol/L   Potassium 4.0 3.5 - 5.1 mmol/L   Chloride 109 98 - 111 mmol/L   CO2 27 22 -  32 mmol/L   Glucose, Bld 135 (H) 70 - 99 mg/dL    Comment: Glucose reference range applies only to samples taken after fasting for at least 8 hours.   BUN 35 (H) 8 - 23 mg/dL   Creatinine, Ser 1.59 (H) 0.61 - 1.24 mg/dL   Calcium 8.8 (L) 8.9 - 10.3 mg/dL   Total Protein 7.2 6.5 - 8.1 g/dL   Albumin 4.0 3.5 - 5.0 g/dL   AST 27 15 - 41 U/L   ALT 17 0 - 44 U/L   Alkaline Phosphatase 45 38 - 126 U/L   Total Bilirubin 0.7 0.3 - 1.2 mg/dL   GFR, Estimated 47 (L) >60 mL/min    Comment: (NOTE) Calculated using the CKD-EPI Creatinine Equation (2021)    Anion gap 8 5 - 15    Comment: Performed at [facility], [place] [address] [place], [place] [address]  Ethanol     Status: None   Collection Time: 11/27/20  8:11 PM  Result Value Ref Range   Alcohol, Ethyl (B) <10 <10 mg/dL    Comment: (NOTE) Lowest detectable limit for  serum alcohol is 10 mg/dL.  For medical purposes only. Performed at [facility], [place] [address], [place], [place] [address]   CBC with Diff     Status: Abnormal   Collection Time: 11/27/20  8:11 PM  Result Value Ref Range   WBC 4.7 4.0 - 10.5 K/uL   RBC 3.57 (L) 4.22 - 5.81 MIL/uL   Hemoglobin 10.5 (L) 13.0 - 17.0 g/dL   HCT 32.5 (L) 39.0 - 52.0 %   MCV 91.0 80.0 - 100.0 fL   MCH 29.4 26.0 - 34.0 pg   MCHC 32.3 30.0 - 36.0 g/dL   RDW 15.9 (H) 11.5 - 15.5 %   Platelets 104 (L) 150 - 400 K/uL    Comment: SPECIMEN CHECKED FOR CLOTS Immature Platelet Fraction may be clinically indicated, consider ordering this additional test [id number] REPEATED TO VERIFY PLATELET COUNT CONFIRMED BY SMEAR    nRBC 0.0 0.0 - 0.2 %   Neutrophils Relative % 60 %   Neutro Abs 2.8 1.7 - 7.7 K/uL   Lymphocytes Relative 29 %   Lymphs Abs 1.3 0.7 - 4.0 K/uL   Monocytes Relative 8 %   Monocytes Absolute 0.4 0.1 - 1.0 K/uL   Eosinophils Relative 3 %   Eosinophils Absolute 0.2 0.0 - 0.5 K/uL   Basophils Relative 0 %   Basophils Absolute 0.0 0.0 - 0.1 K/uL   Immature Granulocytes 0 %   Abs Immature Granulocytes 0.01 0.00 - 0.07 K/uL    Comment: Performed at [facility], [place] [address], [place], [place] [address]  Urine rapid drug screen (hosp performed)     Status: None   Collection Time: 11/27/20  9:43 PM  Result Value Ref Range   Opiates NONE DETECTED NONE DETECTED   Cocaine NONE DETECTED NONE DETECTED   Benzodiazepines NONE DETECTED NONE DETECTED   Amphetamines NONE DETECTED NONE DETECTED   Tetrahydrocannabinol NONE DETECTED NONE DETECTED   Barbiturates NONE DETECTED NONE DETECTED    Comment: (NOTE) DRUG SCREEN FOR MEDICAL PURPOSES ONLY.  IF CONFIRMATION IS NEEDED FOR ANY PURPOSE, NOTIFY LAB WITHIN 5 DAYS.  LOWEST DETECTABLE LIMITS FOR URINE DRUG SCREEN Drug Class                     Cutoff (ng/mL) Amphetamine and metabolites    1000 Barbiturate and  metabolites    200 Benzodiazepine                 448 Tricyclics and metabolites     300 Opiates and metabolites        300 Cocaine and metabolites        300 THC                            50 Performed at [facility], [place] [address] [place], [place] [address]   Urinalysis, Routine w reflex microscopic     Status: Abnormal   Collection Time: 11/27/20  9:43 PM  Result Value Ref Range   Color, Urine YELLOW YELLOW   APPearance CLOUDY (A) CLEAR   Specific Gravity, Urine 1.015 1.005 - 1.030   pH 5.0 5.0 - 8.0   Glucose, UA NEGATIVE NEGATIVE mg/dL   Hgb urine dipstick NEGATIVE NEGATIVE   Bilirubin Urine NEGATIVE NEGATIVE   Ketones, ur NEGATIVE NEGATIVE mg/dL   Protein, ur NEGATIVE NEGATIVE mg/dL   Nitrite NEGATIVE NEGATIVE   Leukocytes,Ua LARGE (A) NEGATIVE   RBC / HPF 6-10 0 - 5 RBC/hpf   WBC, UA >50 (H) 0 - 5 WBC/hpf   Bacteria, UA MANY (A) NONE SEEN   Mucus PRESENT     Comment: Performed at [facility], [place] [address], [place], [place] [address]  Basic metabolic panel     Status: Abnormal   Collection Time: 11/28/20  8:28 AM  Result Value Ref Range   Sodium 140 135 - 145 mmol/L   Potassium 4.0 3.5 - 5.1 mmol/L   Chloride 104 98 - 111 mmol/L   CO2 29 22 - 32 mmol/L   Glucose, Bld 94 70 - 99 mg/dL    Comment: Glucose reference range applies only to samples taken after fasting for at least 8 hours.   BUN 27 (H) 8 - 23 mg/dL   Creatinine, Ser 1.46 (H) 0.61 - 1.24 mg/dL   Calcium 8.9 8.9 - 10.3 mg/dL   GFR, Estimated 52 (L) >60 mL/min    Comment: (NOTE) Calculated using the CKD-EPI Creatinine Equation (2021)    Anion gap 7 5 - 15    Comment: Performed at [facility], [place] [address] [place], [place] [address]  CBC     Status: Abnormal   Collection Time: 11/28/20  8:28 AM  Result Value Ref Range   WBC 7.6 4.0 - 10.5 K/uL   RBC 3.84 (L) 4.22 - 5.81 MIL/uL   Hemoglobin 11.5 (L) 13.0 - 17.0 g/dL   HCT 34.5 (L) 39.0 -  52.0 %   MCV 89.8 80.0 - 100.0 fL   MCH 29.9 26.0 - 34.0 pg   MCHC 33.3 30.0 - 36.0 g/dL   RDW 15.6 (H) 11.5 - 15.5 %   Platelets 111 (L) 150 - 400 K/uL    Comment: Immature Platelet Fraction may be clinically indicated, consider ordering this additional test [id number] CONSISTENT WITH PREVIOUS RESULT    nRBC 0.0 0.0 - 0.2 %    Comment: Performed at [facility], [place] [address], [place], [place] [address]    Current Facility-Administered Medications  Medication Dose Route Frequency Provider Last Rate Last Admin  . acetaminophen (TYLENOL) tablet 650 mg  650 mg Oral Q6H PRN Opyd, Ilene Qua, MD       Or  . acetaminophen (TYLENOL) suppository 650 mg  650 mg Rectal Q6H PRN Opyd,  Ilene Qua, MD      . enoxaparin (LOVENOX) injection 40 mg  40 mg Subcutaneous Q24H Opyd, Ilene Qua, MD   40 mg at 11/28/20 1228  . hydrOXYzine (ATARAX/VISTARIL) tablet 25 mg  25 mg Oral QHS PRN Opyd, Ilene Qua, MD      . meropenem (MERREM) 1 g in sodium chloride 0.9 % 100 mL IVPB  1 g Intravenous Q12H Thomes Lolling, RPH 200 mL/hr at 11/28/20 1227 1 g at 11/28/20 1227  . senna-docusate (Senokot-S) tablet 1 tablet  1 tablet Oral QHS PRN Opyd, Ilene Qua, MD        Musculoskeletal: Strength & Muscle Tone: within normal limits Gait & Station: normal Patient leans: N/A  Psychiatric Specialty Exam: Physical Exam  Review of Systems  Blood pressure 136/72, pulse 63, temperature 97.6 F (36.4 C), temperature source Oral, resp. rate 18, height 5\' 9"  (1.753 m), weight 64.8 kg, SpO2 100 %.Body mass index is 21.1 kg/m.  General Appearance: Casual  Eye Contact:  Fair  Speech:  Garbled and nonsencial  Volume:  Normal  Mood:  Euphoric  Affect:  Appropriate and Congruent  Thought Process:  Coherent, Linear and Descriptions of Associations: Intact  Orientation:  NA  Thought Content:  Illogical, Rumination and Perserveration  Suicidal Thoughts:  UTA  Homicidal Thoughts:  UTA  Memory:  Immediate;    Poor Recent;   Poor Remote;   Poor  Judgement:  Other:  appropriate, appears to be at his baseline  Insight:  Lacking  Psychomotor Activity:  Normal  Concentration:  Concentration: Poor and Attention Span: Poor  Recall:  NA  Fund of Knowledge:  NA  Language:  NA  Akathisia:  NA  Handed:  Right  AIMS (if indicated):     Assets:  Catering manager Housing Physical Health  ADL's:  Impaired  Cognition:  Impaired,  Moderate  Sleep:        Treatment Plan Summary: 70 year old male with long history of Schizophrenia who is currently stable on his medications and receiving ACT team services.  Patient appears to be at baseline mentally and does not meet criteria for inpatient    Recommendations: -Social worker consult to refer patient back to his ACT team when patient is discharge and for SNF placement -Continue current psychiatric medications -Patient's current presentation very consistent with a metabolic encephalopathy related to infection and AKI.  Cognitive improvement will likely lag behind improvement in lab work.     Disposition: No evidence of imminent risk to self or others at present.   Patient does not meet criteria for psychiatric inpatient admission. Psychiatric service siging out. Re-consult as needed  Suella Broad, FNP 11/28/2020 3:13 PM

## 2020-11-28 NOTE — TOC Initial Note (Signed)
Transition of Care [facility]) - Initial/Assessment Note   Patient Details  Name: [name] MRN: [id number] Date of Birth: 03-19-1951  Transition of Care [facility]) CM/SW Contact:    Sherie Don, LCSW Phone Number: 11/28/2020, 12:23 PM  Clinical Narrative: Patient is a 70 year old male who was admitted for acute UTI. Patient has a history of dementia, hypertension, schizophrenia, and chronic kidney disease stage 3a. Readmission checklist completed due to high readmission score.  CSW made two attempts to reach patient's wife, Bralon Antkowiak, but was unable to reach her as her phone number is disconnected. CSW confirmed her phone number with [facility].  [name] is willing to accept the patient back to the memory care unit after he is assessed by [facility] when medically stable. CSW updated hospitalist. TOC to follow.  Expected Discharge Plan: Memory Care Barriers to Discharge: Continued Medical Work up  Patient Goals and CMS Choice Patient states their goals for this hospitalization and ongoing recovery are:: Return to [facility] memory care unit  Expected Discharge Plan and Services Expected Discharge Plan: Memory Care In-house Referral: Clinical Social Work Post Acute Care Choice: [place] Living arrangements for the past 2 months: [place]  Prior Living Arrangements/Services Living arrangements for the past 2 months: [place] Lives with:: Facility Resident Patient language and need for interpreter reviewed:: Yes Do you feel safe going back to the place where you live?: Yes      Need for Family Participation in Patient Care: Yes (Comment) (Patient has dementia.) Care giver support system in place?: Yes (comment) Criminal Activity/Legal Involvement Pertinent to Current Situation/Hospitalization: No - Comment as needed  Activities of Daily Living Home Assistive Devices/Equipment: Other (Comment) (in snf) ADL Screening (condition at time of  admission) Patient's cognitive ability adequate to safely complete daily activities?: No Is the patient deaf or have difficulty hearing?: No Does the patient have difficulty seeing, even when wearing glasses/contacts?: No Does the patient have difficulty concentrating, remembering, or making decisions?: Yes Patient able to express need for assistance with ADLs?: Yes Does the patient have difficulty dressing or bathing?: Yes Independently performs ADLs?: No Communication: Independent (speech difficult to understand at this time) Dressing (OT): Needs assistance Is this a change from baseline?: Pre-admission baseline Grooming: Needs assistance Is this a change from baseline?: Pre-admission baseline Feeding: Needs assistance Is this a change from baseline?: Pre-admission baseline Bathing: Needs assistance Is this a change from baseline?: Pre-admission baseline Toileting: Needs assistance Is this a change from baseline?: Pre-admission baseline In/Out Bed: Needs assistance Is this a change from baseline?: Pre-admission baseline Walks in Home: Needs assistance Is this a change from baseline?: Pre-admission baseline Does the patient have difficulty walking or climbing stairs?: No Weakness of Legs: None Weakness of Arms/Hands: None  Emotional Assessment Appearance:: Appears stated age Attitude/Demeanor/Rapport: Unable to Assess Affect (typically observed): Unable to Assess Orientation: : Oriented to Self Alcohol / Substance Use: Tobacco Use  Admission diagnosis:  Aggressive behavior [R46.89] Encephalopathy [G93.40] Acute UTI [N39.0] Patient Active Problem List   Diagnosis Date Noted  . Aggressive behavior   . Acute UTI 11/27/2020  . Chronic kidney disease, stage 3a ([place]) 11/27/2020  . Thrombocytopenia ([place]) 11/27/2020  . Acute cystitis without hematuria   . Closed fracture of bone of right foot   . Altered mental status 07/07/2020  . Insomnia 06/21/2020  . Anxiety 06/18/2020  .  Acute encephalopathy   . Cognitive impairment 06/12/2020  . AMS (altered mental status) 06/05/2020  .  H/O noncompliance with medical treatment, presenting hazards to health   . DVT (deep venous thrombosis) ([place]) 06/04/2020  . Schizophrenia ([place])   . Homeless   . Dislocation of tarsometatarsal joint   . History of hepatitis B 04/26/2020  . Chronic hepatitis ([place])   . Dementia without behavioral disturbance ([place])   . Atrial fibrillation with RVR ([place]) 04/24/2020  . Malnutrition of moderate degree 04/24/2020  . History of drug abuse ([place]) 10/18/2017  . Tobacco abuse   . Diastolic heart failure ([place]) 02/05/2015  . Healthcare maintenance 05/28/2014  . Hyperlipidemia 03/10/2014  . [place] OCCLUSION 06/03/2010  . PARANOID SCHIZOPHRENIA, CHRONIC 01/17/2009  . Hypertension 01/17/2009  . BPH (benign prostatic hyperplasia) 01/17/2009  . History of hepatitis C 12/14/2008  . DEPRESSION 12/14/2008   PCP:  Danna Hefty, DO Pharmacy:   [facility], [place] [address] [place] [address] Phone: [phone] Fax: [phone]  Readmission Risk Interventions Readmission Risk Prevention Plan 11/28/2020  Transportation Screening Complete  Medication Review (RN Care Manager) Complete  HRI or Home Care Consult Complete  SW Recovery Care/Counseling Consult Complete  Palliative Care Screening Not Applicable  Skilled Nursing Facility Complete  Some recent data might be hidden

## 2020-11-28 NOTE — Progress Notes (Signed)
Pharmacy Antibiotic Note  [name] is a 70 y.o. male admitted on 11/27/2020 with UTI.  Pharmacy has been consulted for Meropenem dosing. Of note, he was hospitalized 3/14>>3/18 for AMS secondary to ESBL bacteremia and treated with Meropenem x48h then switched to oral Bactrim to complete 7d course.  Plan: Meropenem 1gm IV q12h Monitor renal function and cx data      Temp (24hrs), Avg:98.3 F (36.8 C), Min:98 F (36.7 C), Max:98.5 F (36.9 C)  Recent Labs  Lab 11/27/20 2011  WBC 4.7  CREATININE 1.59*    CrCl cannot be calculated (Unknown ideal weight.).    Allergies  Allergen Reactions  . Lisinopril Cough  . Penicillins Hives  . Amoxicillin Rash  . Ibuprofen Rash    Antimicrobials this admission: 4/14 Meropenem >>   Dose adjustments this admission:  Microbiology results: 4/13 BCx:  4/13 UCx:    10/28/20 BCx: ESBL Ecoli- sensitive imipenem  Thank you for allowing pharmacy to be a part of this patient's care.  Netta Cedars PharmD 11/28/2020 12:06 AM
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
## 2020-11-28 NOTE — Plan of Care (Signed)
  Problem: Coping: Goal: Level of anxiety will decrease Outcome: Progressing   Problem: Elimination: Goal: Will not experience complications related to bowel motility Outcome: Progressing Goal: Will not experience complications related to urinary retention Outcome: Progressing   Problem: Safety: Goal: Ability to remain free from injury will improve Outcome: Progressing   

## 2020-11-28 NOTE — ED Notes (Signed)
Patient is able to ambulate to the bathroom with assistance. Patient follows some commands but is confused with incomprehensible speech at baseline. Patient eats and drinks independently. Patient has voided twice in the bathroom. Patient has been changed in two a gown. Belongings were placed in a bag. Patient requires redirection at times.

## 2020-11-29 DIAGNOSIS — R4182 Altered mental status, unspecified: Secondary | ICD-10-CM | POA: Diagnosis not present

## 2020-11-29 DIAGNOSIS — R4689 Other symptoms and signs involving appearance and behavior: Secondary | ICD-10-CM | POA: Diagnosis not present

## 2020-11-29 DIAGNOSIS — N390 Urinary tract infection, site not specified: Secondary | ICD-10-CM | POA: Diagnosis not present

## 2020-11-29 DIAGNOSIS — G9340 Encephalopathy, unspecified: Secondary | ICD-10-CM | POA: Diagnosis not present

## 2020-11-29 DIAGNOSIS — I10 Essential (primary) hypertension: Secondary | ICD-10-CM

## 2020-11-29 DIAGNOSIS — N1831 Chronic kidney disease, stage 3a: Secondary | ICD-10-CM | POA: Diagnosis not present

## 2020-11-29 LAB — BASIC METABOLIC PANEL WITH GFR
Anion gap: 8 (ref 5–15)
BUN: 26 mg/dL — ABNORMAL HIGH (ref 8–23)
CO2: 28 mmol/L (ref 22–32)
Calcium: 9.1 mg/dL (ref 8.9–10.3)
Chloride: 107 mmol/L (ref 98–111)
Creatinine, Ser: 1.6 mg/dL — ABNORMAL HIGH (ref 0.61–1.24)
GFR, Estimated: 46 mL/min — ABNORMAL LOW
Glucose, Bld: 93 mg/dL (ref 70–99)
Potassium: 4.1 mmol/L (ref 3.5–5.1)
Sodium: 143 mmol/L (ref 135–145)

## 2020-11-29 LAB — CBC
HCT: 34.9 % — ABNORMAL LOW (ref 39.0–52.0)
Hemoglobin: 11.4 g/dL — ABNORMAL LOW (ref 13.0–17.0)
MCH: 29.6 pg (ref 26.0–34.0)
MCHC: 32.7 g/dL (ref 30.0–36.0)
MCV: 90.6 fL (ref 80.0–100.0)
Platelets: 121 10*3/uL — ABNORMAL LOW (ref 150–400)
RBC: 3.85 MIL/uL — ABNORMAL LOW (ref 4.22–5.81)
RDW: 15.5 % (ref 11.5–15.5)
WBC: 4.8 10*3/uL (ref 4.0–10.5)
nRBC: 0 % (ref 0.0–0.2)

## 2020-11-29 MED ORDER — HALOPERIDOL LACTATE 5 MG/ML IJ SOLN
5.0000 mg | Freq: Four times a day (QID) | INTRAMUSCULAR | Status: DC | PRN
  Administered 2020-11-30: 5 mg via INTRAMUSCULAR
  Filled 2020-11-29: qty 1

## 2020-11-29 MED ORDER — HALOPERIDOL LACTATE 5 MG/ML IJ SOLN
5.0000 mg | Freq: Once | INTRAMUSCULAR | Status: AC
  Administered 2020-11-29: 5 mg via INTRAMUSCULAR
  Filled 2020-11-29: qty 1

## 2020-11-29 MED ORDER — ALPRAZOLAM 0.5 MG PO TABS
0.5000 mg | ORAL_TABLET | Freq: Three times a day (TID) | ORAL | Status: DC | PRN
  Administered 2020-11-29 – 2020-12-02 (×7): 0.5 mg via ORAL
  Filled 2020-11-29 (×7): qty 1

## 2020-11-29 NOTE — TOC Progression Note (Signed)
Transition of Care [facility]) - Progression Note   Patient Details  Name: QUARTEZ LAGOS MRN: [id number] Date of Birth: 03/04/1951  Transition of Care [facility]) CM/SW [place], LCSW Phone Number: 11/29/2020, 2:38 PM  Clinical Narrative: CSW followed up with [facility]. Per admissions, patient cannot return until he has been without a sitter for 24 hours. Facility requested psych evaluation. Hospitalist and RN updated. Psych notes faxed to [facility]. TOC to follow.  Expected Discharge Plan: Memory Care Barriers to Discharge: Continued Medical Work up  Expected Discharge Plan and Services Expected Discharge Plan: Memory Care In-house Referral: Clinical Social Work Post Acute Care Choice: [place] Living arrangements for the past 2 months: [place]  Readmission Risk Interventions Readmission Risk Prevention Plan 11/28/2020  Transportation Screening Complete  Medication Review [employer] photographer) Complete  HRI or Home Care Consult Complete  SW Recovery Care/Counseling Consult Complete  Palliative Care Screening Not Applicable  Skilled Nursing Facility Complete  Some recent data might be hidden

## 2020-11-29 NOTE — Care Management Important Message (Signed)
Medicare IM printed to give to the patient, by Seleta Hovland 
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
## 2020-11-29 NOTE — Plan of Care (Signed)
Care plan reviewed.  Continue with current plan.
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
## 2020-11-30 DIAGNOSIS — N1831 Chronic kidney disease, stage 3a: Secondary | ICD-10-CM | POA: Diagnosis not present

## 2020-11-30 DIAGNOSIS — G9340 Encephalopathy, unspecified: Secondary | ICD-10-CM | POA: Diagnosis not present

## 2020-11-30 DIAGNOSIS — R4182 Altered mental status, unspecified: Secondary | ICD-10-CM | POA: Diagnosis not present

## 2020-11-30 DIAGNOSIS — R4689 Other symptoms and signs involving appearance and behavior: Secondary | ICD-10-CM | POA: Diagnosis not present

## 2020-11-30 DIAGNOSIS — N390 Urinary tract infection, site not specified: Secondary | ICD-10-CM | POA: Diagnosis not present

## 2020-11-30 LAB — URINE CULTURE: Culture: >=100000 — AB

## 2020-11-30 LAB — CBC
HCT: 36.8 % — ABNORMAL LOW (ref 39.0–52.0)
Hemoglobin: 12.1 g/dL — ABNORMAL LOW (ref 13.0–17.0)
MCH: 29.7 pg (ref 26.0–34.0)
MCHC: 32.9 g/dL (ref 30.0–36.0)
MCV: 90.2 fL (ref 80.0–100.0)
Platelets: 115 10*3/uL — ABNORMAL LOW (ref 150–400)
RBC: 4.08 MIL/uL — ABNORMAL LOW (ref 4.22–5.81)
RDW: 15.4 % (ref 11.5–15.5)
WBC: 4.4 10*3/uL (ref 4.0–10.5)
nRBC: 0 % (ref 0.0–0.2)

## 2020-11-30 LAB — BASIC METABOLIC PANEL
Anion gap: 9 (ref 5–15)
BUN: 28 mg/dL — ABNORMAL HIGH (ref 8–23)
CO2: 27 mmol/L (ref 22–32)
Calcium: 9.3 mg/dL (ref 8.9–10.3)
Chloride: 105 mmol/L (ref 98–111)
Creatinine, Ser: 1.3 mg/dL — ABNORMAL HIGH (ref 0.61–1.24)
GFR, Estimated: 59 mL/min — ABNORMAL LOW (ref >60)
Glucose, Bld: 89 mg/dL (ref 70–99)
Potassium: 4 mmol/L (ref 3.5–5.1)
Sodium: 141 mmol/L (ref 135–145)

## 2020-11-30 MED ORDER — ASENAPINE MALEATE 5 MG SL SUBL
5.0000 mg | SUBLINGUAL_TABLET | Freq: Two times a day (BID) | SUBLINGUAL | Status: DC | PRN
  Administered 2020-11-30: 5 mg via SUBLINGUAL
  Filled 2020-11-30 (×3): qty 1

## 2020-11-30 MED ORDER — SODIUM CHLORIDE 0.9 % IV SOLN
1.0000 g | Freq: Two times a day (BID) | INTRAVENOUS | Status: DC
  Administered 2020-11-30 – 2020-12-01 (×3): 1 g via INTRAVENOUS
  Filled 2020-11-30 (×4): qty 1

## 2020-11-30 NOTE — Consult Note (Signed)
Patient unable to fully participate in psychiatric evaluation. He answered ''I am fine " to all questions. However, staff reports that he has been restless, fidgety and sometimes agitated. He has history of schizophrenia, dementia, chronic kidney disease stage IIIa, recent history of ESBL UTI and lives in a skilled nursing facility where he was brought to the hospital for evaluation of aggressive behavior.Chart and lab review revealed the patient seems to have UTI. It should be noted that quite often, UTI can make psychosis, delusions and agitation worse.    Recommendations: -Consider treating the underlying issues such as  UTI aggressively, it may a major contributing factor to patient's aggressive behavior.  -Consider adding Saphris 5 mg S/L twice daily as needed for agitatio/aggressive behavior. -Re-consult psychiatric service as needed.  Corena Pilgrim, MD Attending psychiatrist

## 2020-11-30 NOTE — Plan of Care (Signed)
Plan of care reviewed. 

## 2020-11-30 NOTE — TOC Progression Note (Signed)
Transition of Care [facility]) - Progression Note    Patient Details  Name: [name] MRN: [id number] Date of Birth: [birth year]  Transition of Care [facility]) CM/SW Contact  Joaquin Courts, RN Phone Number: 11/30/2020, 11:50 AM  Clinical Narrative:    VM left for [facility] rep regarding patient's return to facility, anticipate may be ready on Sunday 4/17.  No response from rep at this time.   Expected Discharge Plan: Memory Care Barriers to Discharge: Continued Medical Work up  Expected Discharge Plan and Services Expected Discharge Plan: Memory Care In-house Referral: Clinical Social Work   Post Acute Care Choice: [place] Living arrangements for the past 2 months: [place]                                       Social Determinants of Health (SDOH) Interventions    Readmission Risk Interventions Readmission Risk Prevention Plan 11/28/2020  Transportation Screening Complete  Medication Review [employer]) Complete  HRI or Home Care Consult Complete  SW Recovery Care/Counseling Consult Complete  Palliative Care Screening Not Applicable  Skilled Nursing Facility Complete  Some recent data might be hidden

## 2020-11-30 NOTE — Plan of Care (Signed)
  Problem: Pain Managment: Goal: General experience of comfort will improve Outcome: Progressing   Problem: Coping: Goal: Level of anxiety will decrease Outcome: Progressing   

## 2020-11-30 NOTE — Plan of Care (Signed)
  Problem: Pain Managment: Goal: General experience of comfort will improve Outcome: Progressing   

## 2020-11-30 NOTE — Progress Notes (Signed)
[facility]  PROGRESS NOTE  [name] RDE:[id number] DOB: [birth year] DOA: 11/27/2020 PCP: Danna Hefty, DO   Brief HPI:   70 year old male with medical history of schizophrenia, dementia, chronic kidney disease stage IIIa, recent history of ESBL UTI presented from skilled nursing facility for evaluation of aggressive behavior.  At baseline patient is calm and cooperative able to state his name but severely dysarthric and difficult to understand.  He became aggressive with residents at the skilled facility which was unclear to state for him.  He was sent to ED for further evaluation.  In the ED he was found to have abnormal UA and started on meropenem due to recent history of ESBL bacteremia/UTI.    Subjective   Patient seen and examined, has been having episodes of agitation since yesterday.  Started on Haldol 5 mg IM every 6 hours as needed with only minimal improvement.   Assessment/Plan:     1. UTI-patient presented with aggressive behavior and abnormal UA.  He has history of ESBL infection, started empirically on meropenem.  Urine culture growing greater than 100,000 colonies per mL of ESBL E. coli, continue with meropenem.  2. Schizophrenia/recent aggressive behavior-patient had episode of aggressive behavior at skilled nursing facility and was sent to ED for further evaluation.  He is managed with long-acting fluphenazine and as needed Vistaril.  Psychiatry was consulted, no inpatient treatment recommended.  Continue Haldol 5 mg IV every 6 hours as needed for agitation.  Also started on Xanax 0.5 mg 3 times daily as needed.  We will reconsult psychiatry for further management for his agitation and medication adjustment. 3. CKD stage IIIa-serum creatinine 1.59 on admission, at baseline.  Today creatinine is 1.30 4. Hypertension-blood pressure is stable, continue Norvasc. 5. Thrombocytopenia-chronic intermittently for past many years.  Today platelet count is 115.  Improved  from 104 yesterday.   Scheduled medications:   . amLODipine  10 mg Oral Daily  . benztropine  1 mg Oral BID  . enoxaparin (LOVENOX) injection  40 mg Subcutaneous Q24H  . ramelteon  8 mg Oral QHS  . tamsulosin  0.8 mg Oral q morning  . traZODone  50 mg Oral QHS         Data Reviewed:   CBG:  No results for input(s): GLUCAP in the last 168 hours.  SpO2: 100 %    Vitals:   11/29/20 2143 11/30/20 0428 11/30/20 0943 11/30/20 1311  BP: 123/67 106/84 133/75 138/75  Pulse: (!) 55 67  70  Resp: 15 16  16   Temp: 98.1 F (36.7 C) (!) 97.5 F (36.4 C)  98 F (36.7 C)  TempSrc: Axillary Oral    SpO2: 100%   100%  Weight:      Height:         Intake/Output Summary (Last 24 hours) at 11/30/2020 1336 Last data filed at 11/30/2020 1328 Gross per 24 hour  Intake 980 ml  Output --  Net 980 ml    04/14 1901 - 04/16 0700 In: 1000 [P.O.:800] Out: 0   Filed Weights   11/28/20 0249  Weight: 64.8 kg    CBC:  Recent Labs  Lab 11/27/20 2011 11/28/20 0828 11/29/20 0745 11/30/20 0743  WBC 4.7 7.6 4.8 4.4  HGB 10.5* 11.5* 11.4* 12.1*  HCT 32.5* 34.5* 34.9* 36.8*  PLT 104* 111* 121* 115*  MCV 91.0 89.8 90.6 90.2  MCH 29.4 29.9 29.6 29.7  MCHC 32.3 33.3 32.7 32.9  RDW 15.9* 15.6* 15.5 15.4  LYMPHSABS 1.3  --   --   --   MONOABS 0.4  --   --   --   EOSABS 0.2  --   --   --   BASOSABS 0.0  --   --   --     Complete metabolic panel:  Recent Labs  Lab 11/27/20 2011 11/28/20 0828 11/29/20 0745 11/30/20 0743  NA 144 140 143 141  K 4.0 4.0 4.1 4.0  CL 109 104 107 105  CO2 27 29 28 27   GLUCOSE 135* 94 93 89  BUN 35* 27* 26* 28*  CREATININE 1.59* 1.46* 1.60* 1.30*  CALCIUM 8.8* 8.9 9.1 9.3  AST 27  --   --   --   ALT 17  --   --   --   ALKPHOS 45  --   --   --   BILITOT 0.7  --   --   --   ALBUMIN 4.0  --   --   --     No results for input(s): LIPASE, AMYLASE in the last 168 hours.  Recent Labs  Lab 11/27/20 2011  SARSCOV2NAA NEGATIVE     ------------------------------------------------------------------------------------------------------------------ No results for input(s): CHOL, HDL, LDLCALC, TRIG, CHOLHDL, LDLDIRECT in the last 72 hours.  Lab Results  Component Value Date   HGBA1C 5.4 08/21/2016   ------------------------------------------------------------------------------------------------------------------ No results for input(s): TSH, T4TOTAL, T3FREE, THYROIDAB in the last 72 hours.  Invalid input(s): FREET3 ------------------------------------------------------------------------------------------------------------------ No results for input(s): VITAMINB12, FOLATE, FERRITIN, TIBC, IRON, RETICCTPCT in the last 72 hours.  Coagulation profile  No results for input(s): INR, PROTIME in the last 168 hours.  No results for input(s): DDIMER in the last 72 hours.  Cardiac Enzymes  No results for input(s): CKMB, TROPONINI, MYOGLOBIN in the last 168 hours.  Invalid input(s): CK ------------------------------------------------------------------------------------------------------------------    Component Value Date/Time   BNP 28.1 01/03/2015 1605     Antibiotics: Anti-infectives (From admission, onward)   Start     Dose/Rate Route Frequency Ordered Stop   11/28/20 1200  meropenem (MERREM) 1 g in sodium chloride 0.9 % 100 mL IVPB        1 g 200 mL/hr over 30 Minutes Intravenous Every 12 hours 11/28/20 0021     11/27/20 2330  meropenem (MERREM) 1 g in sodium chloride 0.9 % 100 mL IVPB        1 g 200 mL/hr over 30 Minutes Intravenous  Once 11/27/20 2325 11/28/20 0109       Radiology Reports  No results found.    DVT prophylaxis: Lovenox  Code Status: Full code  Family Communication: No family at bedside   Consultants:    Procedures:      Objective    Physical Examination:    General-appears in no acute distress Heart-S1-S2, regular, no murmur auscultated Lungs-clear to  auscultation bilaterally, no wheezing or crackles auscultated Abdomen-soft, nontender, no organomegaly Extremities-no edema in the lower extremities Neuro-alert, oriented to self and person, follows commands, dysarthric  Status is: Inpatient  Dispo: The patient is from: Skilled nursing facility               Anticipated d/c is to: Skilled nursing facility              Anticipated d/c date is: 12/01/2020              Patient currently not stable for discharge  Barrier to discharge-ongoing treatment for UTI  COVID-19 Labs  No results for input(s): DDIMER,  FERRITIN, LDH, CRP in the last 72 hours.  Lab Results  Component Value Date   [place] NEGATIVE 11/27/2020   [place] NEGATIVE 10/31/2020   [place] NEGATIVE 10/28/2020   [place] NEGATIVE 07/18/2020    Microbiology  Recent Results (from the past 240 hour(s))  Resp Panel by RT-PCR (Flu A&B, Covid) Nasopharyngeal Swab     Status: None   Collection Time: 11/27/20  8:11 PM   Specimen: Nasopharyngeal Swab; Nasopharyngeal(NP) swabs in vial transport medium  Result Value Ref Range Status   SARS Coronavirus 2 by RT PCR NEGATIVE NEGATIVE Final    Comment: (NOTE) SARS-CoV-2 target nucleic acids are NOT DETECTED.  The SARS-CoV-2 RNA is generally detectable in upper respiratory specimens during the acute phase of infection. The lowest concentration of SARS-CoV-2 viral copies this assay can detect is 138 copies/mL. A negative result does not preclude SARS-Cov-2 infection and should not be used as the sole basis for treatment or other patient management decisions. A negative result may occur with  improper specimen collection/handling, submission of specimen other than nasopharyngeal swab, presence of viral mutation(s) within the areas targeted by this assay, and inadequate number of viral copies(<138 copies/mL). A negative result must be combined with clinical observations, patient history, and  epidemiological information. The expected result is Negative.  Fact Sheet for Patients:  EntrepreneurPulse.com.au  Fact Sheet for Healthcare Providers:  IncredibleEmployment.be  This test is no t yet approved or cleared by the [place] FDA and  has been authorized for detection and/or diagnosis of SARS-CoV-2 by FDA under an Emergency Use Authorization (EUA). This EUA will remain  in effect (meaning this test can be used) for the duration of the COVID-19 declaration under Section 564(b)(1) of the Act, 21 U.S.C.section 360bbb-3(b)(1), unless the authorization is terminated  or revoked sooner.       Influenza A by PCR NEGATIVE NEGATIVE Final   Influenza B by PCR NEGATIVE NEGATIVE Final    Comment: (NOTE) The Xpert Xpress SARS-CoV-2/FLU/RSV plus assay is intended as an aid in the diagnosis of influenza from Nasopharyngeal swab specimens and should not be used as a sole basis for treatment. Nasal washings and aspirates are unacceptable for Xpert Xpress SARS-CoV-2/FLU/RSV testing.  Fact Sheet for Patients: EntrepreneurPulse.com.au  Fact Sheet for Healthcare Providers: IncredibleEmployment.be  This test is not yet approved or cleared by the [place] FDA and has been authorized for detection and/or diagnosis of SARS-CoV-2 by FDA under an Emergency Use Authorization (EUA). This EUA will remain in effect (meaning this test can be used) for the duration of the COVID-19 declaration under Section 564(b)(1) of the Act, 21 U.S.C. section 360bbb-3(b)(1), unless the authorization is terminated or revoked.  Performed at [facility], [place] [address], [place], [place] [address]   Urine culture     Status: Abnormal   Collection Time: 11/27/20 11:06 PM   Specimen: Urine, Clean Catch  Result Value Ref Range Status   Specimen Description   Final    URINE, CLEAN CATCH Performed at [facility], [place] [address], [place], [place] [address]    Special Requests   Final    NONE Performed at [facility], [place] [address], [place], [place] [address]    Culture (A)  Final    >=100,000 COLONIES/mL ESCHERICHIA COLI Confirmed Extended Spectrum Beta-Lactamase Producer (ESBL).  In bloodstream infections from ESBL organisms, carbapenems are preferred over piperacillin/tazobactam. They are shown to have a lower risk of mortality.    Report Status  11/30/2020 FINAL  Final   Organism ID, Bacteria ESCHERICHIA COLI (A)  Final      Susceptibility   Escherichia coli - MIC*    AMPICILLIN >=32 RESISTANT Resistant     CEFAZOLIN >=64 RESISTANT Resistant     CEFEPIME 16 RESISTANT Resistant     CEFTRIAXONE >=64 RESISTANT Resistant     CIPROFLOXACIN 0.5 SENSITIVE Sensitive     GENTAMICIN >=16 RESISTANT Resistant     IMIPENEM <=0.25 SENSITIVE Sensitive     NITROFURANTOIN 64 INTERMEDIATE Intermediate     TRIMETH/SULFA <=20 SENSITIVE Sensitive     AMPICILLIN/SULBACTAM >=32 RESISTANT Resistant     PIP/TAZO <=4 SENSITIVE Sensitive     * >=100,000 COLONIES/mL ESCHERICHIA COLI  Culture, blood (routine x 2)     Status: None (Preliminary result)   Collection Time: 11/27/20 11:50 PM   Specimen: BLOOD  Result Value Ref Range Status   Specimen Description   Final    BLOOD BLOOD RIGHT FOREARM Performed at [place] [address] [place], [place] [address]    Special Requests   Final    BOTTLES DRAWN AEROBIC AND ANAEROBIC Blood Culture adequate volume Performed at [place] [address], [place], [place] [address]    Culture   Final    NO GROWTH 2 DAYS Performed at [place] [address] [place], [place] [address]    Report Status PENDING  Incomplete  Culture, blood (routine x 2)     Status: None (Preliminary result)   Collection Time: 11/27/20 11:50 PM   Specimen: BLOOD  Result Value Ref Range Status    Specimen Description   Final    BLOOD BLOOD RIGHT HAND Performed at [place] [address], [place], [place] [address]    Special Requests   Final    BOTTLES DRAWN AEROBIC AND ANAEROBIC Blood Culture results may not be optimal due to an inadequate volume of blood received in culture bottles Performed at [place] [address], [place], [place] [address]    Culture   Final    NO GROWTH 2 DAYS Performed at [facility] [address] [place], [place] [address]    Report Status PENDING  Incomplete             Oswald Hillock   [facility] If 7PM-7AM, please contact night-coverage at [email or web address], Office  [phone]   11/30/2020, 1:36 PM  LOS: 3 days

## 2020-12-01 DIAGNOSIS — N390 Urinary tract infection, site not specified: Secondary | ICD-10-CM | POA: Diagnosis not present

## 2020-12-01 DIAGNOSIS — N1831 Chronic kidney disease, stage 3a: Secondary | ICD-10-CM | POA: Diagnosis not present

## 2020-12-01 DIAGNOSIS — G9340 Encephalopathy, unspecified: Secondary | ICD-10-CM | POA: Diagnosis not present

## 2020-12-01 DIAGNOSIS — R4689 Other symptoms and signs involving appearance and behavior: Secondary | ICD-10-CM | POA: Diagnosis not present

## 2020-12-01 NOTE — Progress Notes (Signed)
[facility]  PROGRESS NOTE  [name] RWE:[id number] DOB: [birth year] DOA: 11/27/2020 PCP: Danna Hefty, DO   Brief HPI:   70 year old male with medical history of schizophrenia, dementia, chronic kidney disease stage IIIa, recent history of ESBL UTI presented from skilled nursing facility for evaluation of aggressive behavior.  At baseline patient is calm and cooperative able to state his name but severely dysarthric and difficult to understand.  He became aggressive with residents at the skilled facility which was unclear to state for him.  He was sent to ED for further evaluation.  In the ED he was found to have abnormal UA and started on meropenem due to recent history of ESBL bacteremia/UTI.    Subjective   Patient seen and examined, somnolent this morning.   Assessment/Plan:     1. UTI-patient presented with aggressive behavior and abnormal UA.  He has history of ESBL infection, started empirically on meropenem.  Urine culture growing greater than 100,000 colonies per mL of ESBL E. coli, continue with meropenem.  2. Schizophrenia/recent aggressive behavior-patient had episode of aggressive behavior at skilled nursing facility and was sent to ED for further evaluation.  He is managed with long-acting fluphenazine and as needed Vistaril.  Psychiatry was consulted, no inpatient treatment recommended.  Continue Haldol 5 mg IV every 6 hours as needed for agitation.  Also started on Xanax 0.5 mg 3 times daily as needed.  Appreciate psych recommendations.  Patient started on Saphris 5 mg sublingual twice a day as needed for agitation.  3. CKD stage IIIa-serum creatinine 1.59 on admission, at baseline.  Today creatinine is 1.30 4. Hypertension-blood pressure is stable, continue Norvasc. 5. Thrombocytopenia-chronic intermittently for past many years.  Stable   Scheduled medications:   . amLODipine  10 mg Oral Daily  . benztropine  1 mg Oral BID  . enoxaparin (LOVENOX) injection   40 mg Subcutaneous Q24H  . ramelteon  8 mg Oral QHS  . tamsulosin  0.8 mg Oral q morning  . traZODone  50 mg Oral QHS         Data Reviewed:   CBG:  No results for input(s): GLUCAP in the last 168 hours.  SpO2: 100 %    Vitals:   11/30/20 0943 11/30/20 1311 11/30/20 2000 12/01/20 0430  BP: 133/75 138/75 134/83 126/79  Pulse:  70 74 (!) 56  Resp:  16 18 18   Temp:  98 F (36.7 C) 98.1 F (36.7 C) 98.4 F (36.9 C)  TempSrc:   Oral Oral  SpO2:  100% 100% 100%  Weight:      Height:         Intake/Output Summary (Last 24 hours) at 12/01/2020 1249 Last data filed at 12/01/2020 0900 Gross per 24 hour  Intake 1560 ml  Output 1550 ml  Net 10 ml    04/15 1901 - 04/17 0700 In: 1840 [P.O.:1740] Out: 6195 [Urine:1550]  Filed Weights   11/28/20 0249  Weight: 64.8 kg    CBC:  Recent Labs  Lab 11/27/20 2011 11/28/20 0828 11/29/20 0745 11/30/20 0743  WBC 4.7 7.6 4.8 4.4  HGB 10.5* 11.5* 11.4* 12.1*  HCT 32.5* 34.5* 34.9* 36.8*  PLT 104* 111* 121* 115*  MCV 91.0 89.8 90.6 90.2  MCH 29.4 29.9 29.6 29.7  MCHC 32.3 33.3 32.7 32.9  RDW 15.9* 15.6* 15.5 15.4  LYMPHSABS 1.3  --   --   --   MONOABS 0.4  --   --   --  EOSABS 0.2  --   --   --   BASOSABS 0.0  --   --   --     Complete metabolic panel:  Recent Labs  Lab 11/27/20 2011 11/28/20 0828 11/29/20 0745 11/30/20 0743  NA 144 140 143 141  K 4.0 4.0 4.1 4.0  CL 109 104 107 105  CO2 27 29 28 27   GLUCOSE 135* 94 93 89  BUN 35* 27* 26* 28*  CREATININE 1.59* 1.46* 1.60* 1.30*  CALCIUM 8.8* 8.9 9.1 9.3  AST 27  --   --   --   ALT 17  --   --   --   ALKPHOS 45  --   --   --   BILITOT 0.7  --   --   --   ALBUMIN 4.0  --   --   --     No results for input(s): LIPASE, AMYLASE in the last 168 hours.  Recent Labs  Lab 11/27/20 2011  SARSCOV2NAA NEGATIVE    ------------------------------------------------------------------------------------------------------------------ No results for input(s):  CHOL, HDL, LDLCALC, TRIG, CHOLHDL, LDLDIRECT in the last 72 hours.  Lab Results  Component Value Date   HGBA1C 5.4 08/21/2016   ------------------------------------------------------------------------------------------------------------------ No results for input(s): TSH, T4TOTAL, T3FREE, THYROIDAB in the last 72 hours.  Invalid input(s): FREET3 ------------------------------------------------------------------------------------------------------------------ No results for input(s): VITAMINB12, FOLATE, FERRITIN, TIBC, IRON, RETICCTPCT in the last 72 hours.  Coagulation profile  No results for input(s): INR, PROTIME in the last 168 hours.  No results for input(s): DDIMER in the last 72 hours.  Cardiac Enzymes  No results for input(s): CKMB, TROPONINI, MYOGLOBIN in the last 168 hours.  Invalid input(s): CK ------------------------------------------------------------------------------------------------------------------    Component Value Date/Time   BNP 28.1 01/03/2015 1605     Antibiotics: Anti-infectives (From admission, onward)   Start     Dose/Rate Route Frequency Ordered Stop   11/30/20 2200  meropenem (MERREM) 1 g in sodium chloride 0.9 % 100 mL IVPB        1 g 200 mL/hr over 30 Minutes Intravenous Every 12 hours 11/30/20 1602     11/28/20 1200  meropenem (MERREM) 1 g in sodium chloride 0.9 % 100 mL IVPB  Status:  Discontinued        1 g 200 mL/hr over 30 Minutes Intravenous Every 12 hours 11/28/20 0021 11/30/20 1602   11/27/20 2330  meropenem (MERREM) 1 g in sodium chloride 0.9 % 100 mL IVPB        1 g 200 mL/hr over 30 Minutes Intravenous  Once 11/27/20 2325 11/28/20 0109       Radiology Reports  No results found.    DVT prophylaxis: Lovenox  Code Status: Full code  Family Communication: No family at bedside   Consultants:    Procedures:      Objective    Physical Examination:  General-appears in no acute distress Heart-S1-S2, regular,  no murmur auscultated Lungs-clear to auscultation bilaterally, no wheezing or crackles auscultated Abdomen-soft, nontender, no organomegaly Extremities-no edema in the lower extremities Neuro-somnolent but arousable  Status is: Inpatient  Dispo: The patient is from: Skilled nursing facility               Anticipated d/c is to: Skilled nursing facility              Anticipated d/c date is: 12/02/2020              Patient currently not stable for discharge  Barrier to discharge-ongoing treatment  for UTI  COVID-19 Labs  No results for input(s): DDIMER, FERRITIN, LDH, CRP in the last 72 hours.  Lab Results  Component Value Date   [place] NEGATIVE 11/27/2020   [place] NEGATIVE 10/31/2020   [place] NEGATIVE 10/28/2020   [place] NEGATIVE 07/18/2020    Microbiology  Recent Results (from the past 240 hour(s))  Resp Panel by RT-PCR (Flu A&B, Covid) Nasopharyngeal Swab     Status: None   Collection Time: 11/27/20  8:11 PM   Specimen: Nasopharyngeal Swab; Nasopharyngeal(NP) swabs in vial transport medium  Result Value Ref Range Status   SARS Coronavirus 2 by RT PCR NEGATIVE NEGATIVE Final    Comment: (NOTE) SARS-CoV-2 target nucleic acids are NOT DETECTED.  The SARS-CoV-2 RNA is generally detectable in upper respiratory specimens during the acute phase of infection. The lowest concentration of SARS-CoV-2 viral copies this assay can detect is 138 copies/mL. A negative result does not preclude SARS-Cov-2 infection and should not be used as the sole basis for treatment or other patient management decisions. A negative result may occur with  improper specimen collection/handling, submission of specimen other than nasopharyngeal swab, presence of viral mutation(s) within the areas targeted by this assay, and inadequate number of viral copies(<138 copies/mL). A negative result must be combined with clinical observations, patient history, and epidemiological information.  The expected result is Negative.  Fact Sheet for Patients:  EntrepreneurPulse.com.au  Fact Sheet for Healthcare Providers:  IncredibleEmployment.be  This test is no t yet approved or cleared by the [place] FDA and  has been authorized for detection and/or diagnosis of SARS-CoV-2 by FDA under an Emergency Use Authorization (EUA). This EUA will remain  in effect (meaning this test can be used) for the duration of the COVID-19 declaration under Section 564(b)(1) of the Act, 21 U.S.C.section 360bbb-3(b)(1), unless the authorization is terminated  or revoked sooner.       Influenza A by PCR NEGATIVE NEGATIVE Final   Influenza B by PCR NEGATIVE NEGATIVE Final    Comment: (NOTE) The Xpert Xpress SARS-CoV-2/FLU/RSV plus assay is intended as an aid in the diagnosis of influenza from Nasopharyngeal swab specimens and should not be used as a sole basis for treatment. Nasal washings and aspirates are unacceptable for Xpert Xpress SARS-CoV-2/FLU/RSV testing.  Fact Sheet for Patients: EntrepreneurPulse.com.au  Fact Sheet for Healthcare Providers: IncredibleEmployment.be  This test is not yet approved or cleared by the [place] FDA and has been authorized for detection and/or diagnosis of SARS-CoV-2 by FDA under an Emergency Use Authorization (EUA). This EUA will remain in effect (meaning this test can be used) for the duration of the COVID-19 declaration under Section 564(b)(1) of the Act, 21 U.S.C. section 360bbb-3(b)(1), unless the authorization is terminated or revoked.  Performed at [facility], [place] [address] [place], [place] [address]   Urine culture     Status: Abnormal   Collection Time: 11/27/20 11:06 PM   Specimen: Urine, Clean Catch  Result Value Ref Range Status   Specimen Description   Final    URINE, CLEAN CATCH Performed at [facility], [place]  [address] [place], [place] [address]    Special Requests   Final    NONE Performed at [facility], [place] [address], [place], [place] [address]    Culture (A)  Final    >=100,000 COLONIES/mL ESCHERICHIA COLI Confirmed Extended Spectrum Beta-Lactamase Producer (ESBL).  In bloodstream infections from ESBL organisms, carbapenems are preferred over piperacillin/tazobactam. They are shown to  have a lower risk of mortality.    Report Status 11/30/2020 FINAL  Final   Organism ID, Bacteria ESCHERICHIA COLI (A)  Final      Susceptibility   Escherichia coli - MIC*    AMPICILLIN >=32 RESISTANT Resistant     CEFAZOLIN >=64 RESISTANT Resistant     CEFEPIME 16 RESISTANT Resistant     CEFTRIAXONE >=64 RESISTANT Resistant     CIPROFLOXACIN 0.5 SENSITIVE Sensitive     GENTAMICIN >=16 RESISTANT Resistant     IMIPENEM <=0.25 SENSITIVE Sensitive     NITROFURANTOIN 64 INTERMEDIATE Intermediate     TRIMETH/SULFA <=20 SENSITIVE Sensitive     AMPICILLIN/SULBACTAM >=32 RESISTANT Resistant     PIP/TAZO <=4 SENSITIVE Sensitive     * >=100,000 COLONIES/mL ESCHERICHIA COLI  Culture, blood (routine x 2)     Status: None (Preliminary result)   Collection Time: 11/27/20 11:50 PM   Specimen: BLOOD  Result Value Ref Range Status   Specimen Description   Final    BLOOD BLOOD RIGHT FOREARM Performed at [facility] [address], [place], [place] [address]    Special Requests   Final    BOTTLES DRAWN AEROBIC AND ANAEROBIC Blood Culture adequate volume Performed at [facility] [address], [place], [place] [address]    Culture   Final    NO GROWTH 2 DAYS Performed at [facility] [address] [place], [place] [address]    Report Status PENDING  Incomplete  Culture, blood (routine x 2)     Status: None (Preliminary result)   Collection Time: 11/27/20 11:50 PM   Specimen: BLOOD  Result Value Ref Range Status   Specimen Description    Final    BLOOD BLOOD RIGHT HAND Performed at [facility] [address], [place], [place] [address]    Special Requests   Final    BOTTLES DRAWN AEROBIC AND ANAEROBIC Blood Culture results may not be optimal due to an inadequate volume of blood received in culture bottles Performed at [facility] [address] [place], [place] [address]    Culture   Final    NO GROWTH 2 DAYS Performed at [facility] [address] [place], [place] [address]    Report Status PENDING  Incomplete             Oswald Hillock   [facility] If 7PM-7AM, please contact night-coverage at [email or web address], Office  [phone]   12/01/2020, 12:49 PM  LOS: 4 days

## 2020-12-01 NOTE — Progress Notes (Signed)
Pharmacy Antibiotic Note  [name] is a 70 y.o. male admitted on 11/27/2020 with UTI.  Pharmacy has been consulted for Meropenem dosing. Of note, he was hospitalized 3/14>>3/18 for AMS secondary to ESBL bacteremia and treated with Meropenem x48h then switched to oral Bactrim to complete 7d course.  Today is Day #4 Meropenem WBC WNL, Afebrile, SCr down to 1.3  Plan: Continue Meropenem 1gm IV q12h Monitor renal function and cx data   Height: 5\' 9"  (175.3 cm) Weight: 64.8 kg (142 lb 13.7 oz) IBW/kg (Calculated) : 70.7  Temp (24hrs), Avg:98.2 F (36.8 C), Min:98 F (36.7 C), Max:98.4 F (36.9 C)  Recent Labs  Lab 11/27/20 2011 11/28/20 0828 11/29/20 0745 11/30/20 0743  WBC 4.7 7.6 4.8 4.4  CREATININE 1.59* 1.46* 1.60* 1.30*    Estimated Creatinine Clearance: 49.2 mL/min (A) (by C-G formula based on SCr of 1.3 mg/dL (H)).    Allergies  Allergen Reactions  . Lisinopril Cough  . Penicillins Hives  . Amoxicillin Rash  . Ibuprofen Rash    Antimicrobials this admission: 4/14 Meropenem >>   Dose adjustments this admission:  Microbiology results: 4/13 BCx: NGTD 4/13 UCx:  ESBL Ecoli (sens: cipro, imipenem, Bactrim)  Thank you for allowing pharmacy to be a part of this patient's care.  Gretta Arab PharmD, BCPS Clinical Pharmacist [facility] main pharmacy [phone] 12/01/2020 10:35 AM

## 2020-12-02 DIAGNOSIS — I10 Essential (primary) hypertension: Secondary | ICD-10-CM | POA: Diagnosis not present

## 2020-12-02 DIAGNOSIS — N390 Urinary tract infection, site not specified: Secondary | ICD-10-CM | POA: Diagnosis not present

## 2020-12-02 DIAGNOSIS — N1831 Chronic kidney disease, stage 3a: Secondary | ICD-10-CM | POA: Diagnosis not present

## 2020-12-02 DIAGNOSIS — G9340 Encephalopathy, unspecified: Secondary | ICD-10-CM | POA: Diagnosis not present

## 2020-12-02 DIAGNOSIS — R4182 Altered mental status, unspecified: Secondary | ICD-10-CM | POA: Diagnosis not present

## 2020-12-02 LAB — RESP PANEL BY RT-PCR (FLU A&B, COVID) ARPGX2
Influenza A by PCR: NEGATIVE
Influenza B by PCR: NEGATIVE
SARS Coronavirus 2 by RT PCR: NEGATIVE

## 2020-12-02 MED ORDER — FOSFOMYCIN TROMETHAMINE 3 G PO PACK
3.0000 g | PACK | Freq: Once | ORAL | Status: AC
  Administered 2020-12-02: 3 g via ORAL
  Filled 2020-12-02: qty 3

## 2020-12-02 MED ORDER — ASENAPINE MALEATE 5 MG SL SUBL: 5.0000 mg | SUBLINGUAL_TABLET | Freq: Two times a day (BID) | SUBLINGUAL | Status: DC | PRN

## 2020-12-02 NOTE — Progress Notes (Signed)
Patient was taken to [facility] by [facility].  He was medically stable for discharge. AVS was put in packet for the facility taken by [facility].

## 2020-12-02 NOTE — Discharge Summary (Addendum)
Physician Discharge Summary  GREENE DIODATO XKG:[id number] DOB: October 06, 1950 DOA: 11/27/2020  PCP: Danna Hefty, DO  Admit date: 11/27/2020 Discharge date: 12/02/2020  Time spent: 50 minutes  Recommendations for Outpatient Follow-up:  Patient to be discharged to memory care unit at skilled facility Patient has been without a sitter for past 24 + hours  Discharge Diagnoses:  Principal Problem:   Acute UTI Active Problems:   Hypertension   Schizophrenia ([place])   Chronic kidney disease, stage 3a ([place])   Thrombocytopenia ([place])   Severely aggressive behavior   Discharge Condition: Stable  Diet recommendation: Regular diet  Filed Weights   11/28/20 0249  Weight: 64.8 kg    History of present illness:  70 year old male with medical history of schizophrenia, dementia, chronic kidney disease stage IIIa, recent history of ESBL UTI presented from skilled nursing facility for evaluation of aggressive behavior.  At baseline patient is calm and cooperative able to state his name but severely dysarthric and difficult to understand.  He became aggressive with residents at the skilled facility which was unclear to state for him.  He was sent to ED for further evaluation.  In the ED he was found to have abnormal UA and started on meropenem due to recent history of ESBL bacteremia/UTI.  Hospital Course:   1. UTI-patient presented with aggressive behavior and abnormal UA.  He has history of ESBL infection, started empirically on meropenem.  Urine culture growing greater than 100,000 colonies per mL of ESBL E. Coli.  Patient received 3 days of IV meropenem.  I called and discussed with ID Dr. Juleen China, he recommended to give 1 dose of fosfomycin 3 g p.o. x1.  No further treatment recommended.  2. Schizophrenia/recent aggressive behavior-patient had episode of aggressive behavior at skilled nursing facility and was sent to ED for further evaluation.  He is managed with long-acting fluphenazine and as  needed Vistaril.  Psychiatry was consulted, no inpatient treatment recommended.  Continue Haldol 5 mg IV every 6 hours as needed for agitation.  Marland Kitchen  Appreciate psych recommendations.  Patient started on Saphris 5 mg sublingual twice a day as needed for agitation.   3. CKD stage IIIa-serum creatinine 1.59 on admission, at baseline.  Today creatinine is 1.30  4. Hypertension-blood pressure is stable, continue Norvasc.  5. Thrombocytopenia-chronic intermittently for past many years. Stable    Procedures:    Consultations:    Discharge Exam: Vitals:   12/01/20 1958 12/02/20 0620  BP: 123/86 119/80  Pulse: 70 63  Resp: 17 18  Temp: 98.2 F (36.8 C) 98.2 F (36.8 C)  SpO2: 100% 100%    General: Appears in no acute distress Cardiovascular: S1-S2, regular Respiratory: Clear to auscultation bilaterally  Discharge Instructions   Discharge Instructions    Diet - low sodium heart healthy   Complete by: As directed    Increase activity slowly   Complete by: As directed      Allergies as of 12/02/2020      Reactions   Lisinopril Cough   Penicillins Hives   Amoxicillin Rash   Ibuprofen Rash      Medication List    TAKE these medications   acetaminophen 325 MG tablet Commonly known as: TYLENOL Take 2 tablets (650 mg total) by mouth every 6 (six) hours as needed for mild pain, moderate pain or headache.   amLODipine 10 MG tablet Commonly known as: NORVASC Take 1 tablet (10 mg total) by mouth daily.   asenapine 5 MG Subl 24  hr tablet Commonly known as: SAPHRIS Place 1 tablet (5 mg total) under the tongue 2 (two) times daily as needed (agitation/aggression).   benztropine 1 MG tablet Commonly known as: COGENTIN Take 1 tablet (1 mg total) by mouth 2 (two) times daily. What changed: additional instructions   cetirizine 10 MG tablet Commonly known as: ZyrTEC Allergy Take 1 tablet (10 mg total) by mouth daily.   famotidine 20 MG tablet Commonly known as:  PEPCID Take 1 tablet (20 mg total) by mouth 2 (two) times daily.   fluPHENAZine decanoate 25 MG/ML injection Commonly known as: PROLIXIN Inject 1 mL (25 mg total) into the muscle every 14 (fourteen) days. Last dose 07/08/2020. Next dose due 07/22/2020. What changed: additional instructions   folic acid 1 MG tablet Commonly known as: FOLVITE Take 1 tablet (1 mg total) by mouth daily.   hydrOXYzine 25 MG tablet Commonly known as: ATARAX/VISTARIL Take 1 tablet (25 mg total) by mouth at bedtime as needed for anxiety (Anxiety, Insomnia).   multivitamin with minerals Tabs tablet Take 1 tablet by mouth daily.   ramelteon 8 MG tablet Commonly known as: ROZEREM Take 1 tablet (8 mg total) by mouth at bedtime.   tamsulosin 0.4 MG Caps capsule Commonly known as: FLOMAX TAKE 2 CAPSULE BY MOUTH DAILY What changed: See the new instructions.   thiamine 100 MG tablet Take 1 tablet (100 mg total) by mouth daily.   traZODone 50 MG tablet Commonly known as: DESYREL Take 1 tablet (50 mg total) by mouth at bedtime.   vitamin C 500 MG tablet Commonly known as: ASCORBIC ACID Take 500 mg by mouth daily.      Allergies  Allergen Reactions  . Lisinopril Cough  . Penicillins Hives  . Amoxicillin Rash  . Ibuprofen Rash      The results of significant diagnostics from this hospitalization (including imaging, microbiology, ancillary and laboratory) are listed below for reference.    Significant Diagnostic Studies: DG Chest Port 1 View  Result Date: 11/09/2020 CLINICAL DATA:  Altered mental status, possible seizure EXAM: PORTABLE CHEST 1 VIEW COMPARISON:  Portable exam 1224 hours compared to 10/28/2020 FINDINGS: Upper normal heart size. Mediastinal contours and pulmonary vascularity normal. Hypoinflated lung bases with bibasilar atelectasis. Questionable nodular density at RIGHT base, corresponds to an incomplete costal bar on an earlier chest radiograph of 05/12/2020. No pleural effusion or  pneumothorax. Osseous structures unremarkable. IMPRESSION: Bibasilar atelectasis. Electronically Signed   By: Lavonia Dana M.D.   On: 11/09/2020 12:30    Microbiology: Recent Results (from the past 240 hour(s))  Resp Panel by RT-PCR (Flu A&B, Covid) Nasopharyngeal Swab     Status: None   Collection Time: 11/27/20  8:11 PM   Specimen: Nasopharyngeal Swab; Nasopharyngeal(NP) swabs in vial transport medium  Result Value Ref Range Status   SARS Coronavirus 2 by RT PCR NEGATIVE NEGATIVE Final    Comment: (NOTE) SARS-CoV-2 target nucleic acids are NOT DETECTED.  The SARS-CoV-2 RNA is generally detectable in upper respiratory specimens during the acute phase of infection. The lowest concentration of SARS-CoV-2 viral copies this assay can detect is 138 copies/mL. A negative result does not preclude SARS-Cov-2 infection and should not be used as the sole basis for treatment or other patient management decisions. A negative result may occur with  improper specimen collection/handling, submission of specimen other than nasopharyngeal swab, presence of viral mutation(s) within the areas targeted by this assay, and inadequate number of viral copies(<138 copies/mL). A negative result must be combined  with clinical observations, patient history, and epidemiological information. The expected result is Negative.  Fact Sheet for Patients:  EntrepreneurPulse.com.au  Fact Sheet for Healthcare Providers:  IncredibleEmployment.be  This test is no t yet approved or cleared by the [place] FDA and  has been authorized for detection and/or diagnosis of SARS-CoV-2 by FDA under an Emergency Use Authorization (EUA). This EUA will remain  in effect (meaning this test can be used) for the duration of the COVID-19 declaration under Section 564(b)(1) of the Act, 21 U.S.C.section 360bbb-3(b)(1), unless the authorization is terminated  or revoked sooner.       Influenza  A by PCR NEGATIVE NEGATIVE Final   Influenza B by PCR NEGATIVE NEGATIVE Final    Comment: (NOTE) The Xpert Xpress SARS-CoV-2/FLU/RSV plus assay is intended as an aid in the diagnosis of influenza from Nasopharyngeal swab specimens and should not be used as a sole basis for treatment. Nasal washings and aspirates are unacceptable for Xpert Xpress SARS-CoV-2/FLU/RSV testing.  Fact Sheet for Patients: EntrepreneurPulse.com.au  Fact Sheet for Healthcare Providers: IncredibleEmployment.be  This test is not yet approved or cleared by the [place] FDA and has been authorized for detection and/or diagnosis of SARS-CoV-2 by FDA under an Emergency Use Authorization (EUA). This EUA will remain in effect (meaning this test can be used) for the duration of the COVID-19 declaration under Section 564(b)(1) of the Act, 21 U.S.C. section 360bbb-3(b)(1), unless the authorization is terminated or revoked.  Performed at [facility], [place] [address] [place], [place] [address]   Urine culture     Status: Abnormal   Collection Time: 11/27/20 11:06 PM   Specimen: Urine, Clean Catch  Result Value Ref Range Status   Specimen Description   Final    URINE, CLEAN CATCH Performed at [facility], [place] [address], [place], [place] [address]    Special Requests   Final    NONE Performed at [facility], [place] [address], [place], [place] [address]    Culture (A)  Final    >=100,000 COLONIES/mL ESCHERICHIA COLI Confirmed Extended Spectrum Beta-Lactamase Producer (ESBL).  In bloodstream infections from ESBL organisms, carbapenems are preferred over piperacillin/tazobactam. They are shown to have a lower risk of mortality.    Report Status 11/30/2020 FINAL  Final   Organism ID, Bacteria ESCHERICHIA COLI (A)  Final      Susceptibility   Escherichia coli - MIC*    AMPICILLIN >=32 RESISTANT Resistant     CEFAZOLIN  >=64 RESISTANT Resistant     CEFEPIME 16 RESISTANT Resistant     CEFTRIAXONE >=64 RESISTANT Resistant     CIPROFLOXACIN 0.5 SENSITIVE Sensitive     GENTAMICIN >=16 RESISTANT Resistant     IMIPENEM <=0.25 SENSITIVE Sensitive     NITROFURANTOIN 64 INTERMEDIATE Intermediate     TRIMETH/SULFA <=20 SENSITIVE Sensitive     AMPICILLIN/SULBACTAM >=32 RESISTANT Resistant     PIP/TAZO <=4 SENSITIVE Sensitive     * >=100,000 COLONIES/mL ESCHERICHIA COLI  Culture, blood (routine x 2)     Status: None (Preliminary result)   Collection Time: 11/27/20 11:50 PM   Specimen: BLOOD  Result Value Ref Range Status   Specimen Description   Final    BLOOD BLOOD RIGHT FOREARM Performed at [place] [address], [place], [place] [address]    Special Requests   Final    BOTTLES DRAWN AEROBIC AND ANAEROBIC Blood Culture adequate volume Performed at [facility], [address] [place], [place] [address]    Culture   Final    NO GROWTH 4 DAYS Performed at [facility], [place] [address], [place], [place] [address]    Report Status PENDING  Incomplete  Culture, blood (routine x 2)     Status: None (Preliminary result)   Collection Time: 11/27/20 11:50 PM   Specimen: BLOOD  Result Value Ref Range Status   Specimen Description   Final    BLOOD BLOOD RIGHT HAND Performed at [place] [address], [place], [place] [address]    Special Requests   Final    BOTTLES DRAWN AEROBIC AND ANAEROBIC Blood Culture results may not be optimal due to an inadequate volume of blood received in culture bottles Performed at [place] [address], [place], [place] [address]    Culture   Final    NO GROWTH 4 DAYS Performed at [facility], [place] [address], [place],  [address]    Report Status PENDING  Incomplete  Resp Panel by RT-PCR (Flu A&B, Covid) Nasopharyngeal Swab     Status: None   Collection Time:  12/02/20  9:59 AM   Specimen: Nasopharyngeal Swab; Nasopharyngeal(NP) swabs in vial transport medium  Result Value Ref Range Status   SARS Coronavirus 2 by RT PCR NEGATIVE NEGATIVE Final    Comment: (NOTE) SARS-CoV-2 target nucleic acids are NOT DETECTED.  The SARS-CoV-2 RNA is generally detectable in upper respiratory specimens during the acute phase of infection. The lowest concentration of SARS-CoV-2 viral copies this assay can detect is 138 copies/mL. A negative result does not preclude SARS-Cov-2 infection and should not be used as the sole basis for treatment or other patient management decisions. A negative result may occur with  improper specimen collection/handling, submission of specimen other than nasopharyngeal swab, presence of viral mutation(s) within the areas targeted by this assay, and inadequate number of viral copies(<138 copies/mL). A negative result must be combined with clinical observations, patient history, and epidemiological information. The expected result is Negative.  Fact Sheet for Patients:  EntrepreneurPulse.com.au  Fact Sheet for Healthcare Providers:  IncredibleEmployment.be  This test is no t yet approved or cleared by the [place] FDA and  has been authorized for detection and/or diagnosis of SARS-CoV-2 by FDA under an Emergency Use Authorization (EUA). This EUA will remain  in effect (meaning this test can be used) for the duration of the COVID-19 declaration under Section 564(b)(1) of the Act, 21 U.S.C.section 360bbb-3(b)(1), unless the authorization is terminated  or revoked sooner.       Influenza A by PCR NEGATIVE NEGATIVE Final   Influenza B by PCR NEGATIVE NEGATIVE Final    Comment: (NOTE) The Xpert Xpress SARS-CoV-2/FLU/RSV plus assay is intended as an aid in the diagnosis of influenza from Nasopharyngeal swab specimens and should not be used as a sole basis for treatment. Nasal washings  and aspirates are unacceptable for Xpert Xpress SARS-CoV-2/FLU/RSV testing.  Fact Sheet for Patients: EntrepreneurPulse.com.au  Fact Sheet for Healthcare Providers: IncredibleEmployment.be  This test is not yet approved or cleared by the [place] FDA and has been authorized for detection and/or diagnosis of SARS-CoV-2 by FDA under an Emergency Use Authorization (EUA). This EUA will remain in effect (meaning this test can be used) for the duration of the COVID-19 declaration under Section 564(b)(1) of the Act, 21 U.S.C. section 360bbb-3(b)(1), unless the authorization is terminated or revoked.  Performed at [facility],  [place] [address], [place], [place] [address]      Labs: Basic Metabolic Panel: Recent Labs  Lab 11/27/20 2011 11/28/20 0828 11/29/20 0745 11/30/20 0743  NA 144 140 143 141  K 4.0 4.0 4.1 4.0  CL 109 104 107 105  CO2 27 29 28 27   GLUCOSE 135* 94 93 89  BUN 35* 27* 26* 28*  CREATININE 1.59* 1.46* 1.60* 1.30*  CALCIUM 8.8* 8.9 9.1 9.3   Liver Function Tests: Recent Labs  Lab 11/27/20 2011  AST 27  ALT 17  ALKPHOS 45  BILITOT 0.7  PROT 7.2  ALBUMIN 4.0   No results for input(s): LIPASE, AMYLASE in the last 168 hours. No results for input(s): AMMONIA in the last 168 hours. CBC: Recent Labs  Lab 11/27/20 2011 11/28/20 0828 11/29/20 0745 11/30/20 0743  WBC 4.7 7.6 4.8 4.4  NEUTROABS 2.8  --   --   --   HGB 10.5* 11.5* 11.4* 12.1*  HCT 32.5* 34.5* 34.9* 36.8*  MCV 91.0 89.8 90.6 90.2  PLT 104* 111* 121* 115*       Signed:  Oswald Hillock MD.  [facility] 12/02/2020, 11:20 AM

## 2020-12-02 NOTE — TOC Transition Note (Signed)
Transition of Care [facility]) - CM/SW Discharge Note  Patient Details  Name: [name] MRN: [id number] Date of Birth: 07/23/1951  [facility]) CM/SW Contact:  Sherie Don, LCSW Phone Number: 12/02/2020, 11:13 AM  Clinical Narrative: Patient medically stable to return to [facility] memory care unit. FL2 completed. COVID test is negative. Discharge summary, discharge orders, FL2, and SNF transfer report faxed to [facility] in [name]. Medical necessity form done; PTAR scheduled. Discharge packet completed. CSW attempted to call wife, but number is disconnected. TOC signing off.  Final next level of care: Memory Care Barriers to Discharge: Barriers Resolved  Patient Goals and CMS Choice Patient states their goals for this hospitalization and ongoing recovery are:: Return to [facility] memory care unit CMS Medicare.gov Compare Post Acute Care list provided to:: Patient Represenative (must comment) Choice offered to / list presented to : Spouse  Discharge Placement         Patient [facility] bed at: [facility] Patient to be transferred to facility by: PTAR Patient and family notified of of transfer: 12/02/20  Discharge Plan and Services In-house Referral: Clinical Social Work Post Acute Care Choice: [place]          DME Arranged: N/A DME Agency: NA  Readmission Risk Interventions Readmission Risk Prevention Plan 11/28/2020  Transportation Screening Complete  Medication Review Press photographer) Complete  HRI or Home Care Consult Complete  SW Recovery Care/Counseling Consult Complete  Palliative Care Screening Not Applicable  Skilled Nursing Facility Complete  Some recent data might be hidden

## 2020-12-02 NOTE — NC FL2 (Signed)
[place] LEVEL OF CARE SCREENING TOOL     IDENTIFICATION  Patient Name: [name] Birthdate: 25-Dec-1950 Sex: male Admission Date (Current Location): 11/27/2020  [place] and [place] Number:  Kathleen Argue [phone] [place] and Address:  [facility],  [place], [place]      Provider Number: [id number]  Attending Physician Name and Address:  Oswald Hillock, MD  Relative Name and Phone Number:  Austine Wiedeman (wife) Ph: [phone]    Current Level of Care: Hospital Recommended Level of Care: Memory Care Prior Approval Number:    Date Approved/Denied:   PASRR Number:    Discharge Plan: Other (Comment) ([place] (memory care))    Current Diagnoses: Patient Active Problem List   Diagnosis Date Noted  . Severely aggressive behavior   . Acute UTI 11/27/2020  . Chronic kidney disease, stage 3a ([place]) 11/27/2020  . Thrombocytopenia ([place]) 11/27/2020  . Acute cystitis without hematuria   . Closed fracture of bone of right foot   . Altered mental status 07/07/2020  . Insomnia 06/21/2020  . Anxiety 06/18/2020  . Acute encephalopathy   . Cognitive impairment 06/12/2020  . AMS (altered mental status) 06/05/2020  . H/O noncompliance with medical treatment, presenting hazards to health   . DVT (deep venous thrombosis) ([place]) 06/04/2020  . Schizophrenia ([place])   . Homeless   . Dislocation of tarsometatarsal joint   . History of hepatitis B 04/26/2020  . Chronic hepatitis ([place])   . Dementia without behavioral disturbance ([place])   . Atrial fibrillation with RVR ([place]) 04/24/2020  . Malnutrition of moderate degree 04/24/2020  . History of drug abuse ([place]) 10/18/2017  . Tobacco abuse   . Diastolic heart failure ([place]) 02/05/2015  . Healthcare maintenance 05/28/2014  . Hyperlipidemia 03/10/2014  . [place] OCCLUSION 06/03/2010  . PARANOID SCHIZOPHRENIA, CHRONIC 01/17/2009  . Hypertension 01/17/2009  . BPH (benign prostatic  hyperplasia) 01/17/2009  . History of hepatitis C 12/14/2008  . DEPRESSION 12/14/2008    Orientation RESPIRATION BLADDER Height & Weight     Self  Normal Incontinent Weight: 142 lb 13.7 oz (64.8 kg) Height:  5\' 9"  (175.3 cm)  BEHAVIORAL SYMPTOMS/MOOD NEUROLOGICAL BOWEL NUTRITION STATUS  [place] (Patient resides in locked memory care unit at baseline.)   Incontinent Diet (Heart healthy)  AMBULATORY STATUS COMMUNICATION OF NEEDS Skin   Limited Assist Verbally Other (Comment) (Ecchymosis: back)                       Personal Care Assistance Level of Assistance  Bathing,Feeding,Dressing Bathing Assistance: Maximum assistance Feeding assistance: Limited assistance Dressing Assistance: Maximum assistance     Functional Limitations Info  Sight,Hearing,Speech Sight Info: Adequate Hearing Info: Adequate Speech Info: Impaired    SPECIAL CARE FACTORS FREQUENCY                       Contractures Contractures Info: Not present    Additional Factors Info  Code Status,Allergies,Psychotropic Code Status Info: Full Allergies Info: Lisinopril, Penicillins, Amoxicillin, Ibuprofen Psychotropic Info: Desyrel (trazadone), Saphris, Haldol, Xanax         Current Medications (12/02/2020):  This is the current hospital active medication list Current Facility-Administered Medications  Medication Dose Route Frequency Provider Last Rate Last Admin  . acetaminophen (TYLENOL) tablet 650 mg  650 mg Oral Q6H PRN Opyd, Ilene Qua, MD       Or  . acetaminophen (TYLENOL) suppository 650 mg  650 mg  Rectal Q6H PRN Opyd, Ilene Qua, MD      . ALPRAZolam Duanne Moron) tablet 0.5 mg  0.5 mg Oral TID PRN Oswald Hillock, MD   0.5 mg at 12/02/20 0723  . amLODipine (NORVASC) tablet 10 mg  10 mg Oral Daily Oswald Hillock, MD   10 mg at 12/01/20 1024  . asenapine (SAPHRIS) sublingual tablet 5 mg  5 mg Sublingual BID PRN Corena Pilgrim, MD   5 mg at 11/30/20 1507  . benztropine (COGENTIN) tablet 1 mg  1 mg  Oral BID Oswald Hillock, MD   1 mg at 12/01/20 2158  . enoxaparin (LOVENOX) injection 40 mg  40 mg Subcutaneous Q24H Opyd, Ilene Qua, MD   40 mg at 12/01/20 1024  . haloperidol lactate (HALDOL) injection 5 mg  5 mg Intramuscular Q6H PRN Oswald Hillock, MD   5 mg at 11/30/20 0936  . meropenem (MERREM) 1 g in sodium chloride 0.9 % 100 mL IVPB  1 g Intravenous Q12H Oswald Hillock, MD 200 mL/hr at 12/01/20 2205 1 g at 12/01/20 2205  . ramelteon (ROZEREM) tablet 8 mg  8 mg Oral QHS Oswald Hillock, MD   8 mg at 12/01/20 2159  . senna-docusate (Senokot-S) tablet 1 tablet  1 tablet Oral QHS PRN Opyd, Ilene Qua, MD      . tamsulosin (FLOMAX) capsule 0.8 mg  0.8 mg Oral q morning Darrick Meigs, Gagan S, MD   0.8 mg at 12/01/20 1030  . traZODone (DESYREL) tablet 50 mg  50 mg Oral QHS Oswald Hillock, MD   50 mg at 12/01/20 2157     Discharge Medications: Please see discharge summary for a list of discharge medications.  Relevant Imaging Results:  Relevant Lab Results:   Additional Information SSN: [id number]  Sherie Don, LCSW

## 2020-12-03 LAB — CULTURE, BLOOD (ROUTINE X 2)
Culture: NO GROWTH
Culture: NO GROWTH
Special Requests: ADEQUATE

## 2021-01-11 ENCOUNTER — Emergency Department (HOSPITAL_COMMUNITY)
Admission: EM | Admit: 2021-01-11 | Discharge: 2021-01-15 | Disposition: A | Discharge status: Home / Self Care | Payer: Medicare Other | Attending: Emergency Medicine | Admitting: Emergency Medicine

## 2021-01-11 ENCOUNTER — Emergency Department (HOSPITAL_COMMUNITY): Payer: Medicare Other

## 2021-01-11 ENCOUNTER — Other Ambulatory Visit: Payer: Self-pay

## 2021-01-11 DIAGNOSIS — I5032 Chronic diastolic (congestive) heart failure: Secondary | ICD-10-CM | POA: Diagnosis not present

## 2021-01-11 DIAGNOSIS — N183 Chronic kidney disease, stage 3 unspecified: Secondary | ICD-10-CM | POA: Insufficient documentation

## 2021-01-11 DIAGNOSIS — R4182 Altered mental status, unspecified: Secondary | ICD-10-CM

## 2021-01-11 DIAGNOSIS — Z20822 Contact with and (suspected) exposure to covid-19: Secondary | ICD-10-CM | POA: Insufficient documentation

## 2021-01-11 DIAGNOSIS — F201 Disorganized schizophrenia: Secondary | ICD-10-CM | POA: Insufficient documentation

## 2021-01-11 DIAGNOSIS — F17210 Nicotine dependence, cigarettes, uncomplicated: Secondary | ICD-10-CM | POA: Insufficient documentation

## 2021-01-11 DIAGNOSIS — Z79899 Other long term (current) drug therapy: Secondary | ICD-10-CM | POA: Diagnosis not present

## 2021-01-11 DIAGNOSIS — I130 Hypertensive heart and chronic kidney disease with heart failure and stage 1 through stage 4 chronic kidney disease, or unspecified chronic kidney disease: Secondary | ICD-10-CM | POA: Insufficient documentation

## 2021-01-11 DIAGNOSIS — F0391 Unspecified dementia with behavioral disturbance: Secondary | ICD-10-CM | POA: Insufficient documentation

## 2021-01-11 DIAGNOSIS — F209 Schizophrenia, unspecified: Secondary | ICD-10-CM | POA: Insufficient documentation

## 2021-01-11 DIAGNOSIS — F03918 Unspecified dementia, unspecified severity, with other behavioral disturbance: Secondary | ICD-10-CM

## 2021-01-11 DIAGNOSIS — R456 Violent behavior: Secondary | ICD-10-CM | POA: Diagnosis present

## 2021-01-11 LAB — RESP PANEL BY RT-PCR (FLU A&B, COVID) ARPGX2
Influenza A by PCR: NEGATIVE
Influenza B by PCR: NEGATIVE
SARS Coronavirus 2 by RT PCR: NEGATIVE

## 2021-01-11 LAB — COMPREHENSIVE METABOLIC PANEL
ALT: 21 U/L (ref 0–44)
AST: 25 U/L (ref 15–41)
Albumin: 3.9 g/dL (ref 3.5–5.0)
Alkaline Phosphatase: 41 U/L (ref 38–126)
Anion gap: 6 (ref 5–15)
BUN: 32 mg/dL — ABNORMAL HIGH (ref 8–23)
CO2: 28 mmol/L (ref 22–32)
Calcium: 8.8 mg/dL — ABNORMAL LOW (ref 8.9–10.3)
Chloride: 106 mmol/L (ref 98–111)
Creatinine, Ser: 1.42 mg/dL — ABNORMAL HIGH (ref 0.61–1.24)
GFR, Estimated: 53 mL/min — ABNORMAL LOW (ref >60)
Glucose, Bld: 93 mg/dL (ref 70–99)
Potassium: 4.2 mmol/L (ref 3.5–5.1)
Sodium: 140 mmol/L (ref 135–145)
Total Bilirubin: 0.5 mg/dL (ref 0.3–1.2)
Total Protein: 6.8 g/dL (ref 6.5–8.1)

## 2021-01-11 LAB — CBC WITH DIFFERENTIAL/PLATELET
Abs Immature Granulocytes: 0 10*3/uL (ref 0.00–0.07)
Basophils Absolute: 0 10*3/uL (ref 0.0–0.1)
Basophils Relative: 1 %
Eosinophils Absolute: 0.1 10*3/uL (ref 0.0–0.5)
Eosinophils Relative: 3 %
HCT: 37.2 % — ABNORMAL LOW (ref 39.0–52.0)
Hemoglobin: 12.1 g/dL — ABNORMAL LOW (ref 13.0–17.0)
Immature Granulocytes: 0 %
Lymphocytes Relative: 43 %
Lymphs Abs: 1.9 10*3/uL (ref 0.7–4.0)
MCH: 29.3 pg (ref 26.0–34.0)
MCHC: 32.5 g/dL (ref 30.0–36.0)
MCV: 90.1 fL (ref 80.0–100.0)
Monocytes Absolute: 0.4 10*3/uL (ref 0.1–1.0)
Monocytes Relative: 10 %
Neutro Abs: 1.9 10*3/uL (ref 1.7–7.7)
Neutrophils Relative %: 43 %
Platelets: 116 10*3/uL — ABNORMAL LOW (ref 150–400)
RBC: 4.13 MIL/uL — ABNORMAL LOW (ref 4.22–5.81)
RDW: 14.5 % (ref 11.5–15.5)
WBC: 4.4 10*3/uL (ref 4.0–10.5)
nRBC: 0 % (ref 0.0–0.2)

## 2021-01-11 MED ORDER — AMLODIPINE BESYLATE 5 MG PO TABS
10.0000 mg | ORAL_TABLET | Freq: Every day | ORAL | Status: DC
  Administered 2021-01-12 – 2021-01-14 (×3): 10 mg via ORAL
  Filled 2021-01-11 (×3): qty 2

## 2021-01-11 MED ORDER — BENZTROPINE MESYLATE 1 MG PO TABS
1.0000 mg | ORAL_TABLET | Freq: Two times a day (BID) | ORAL | Status: DC
  Administered 2021-01-12 – 2021-01-14 (×6): 1 mg via ORAL
  Filled 2021-01-11 (×6): qty 1

## 2021-01-11 MED ORDER — ADULT MULTIVITAMIN W/MINERALS CH
1.0000 | ORAL_TABLET | Freq: Every day | ORAL | Status: DC
  Administered 2021-01-12 – 2021-01-14 (×3): 1 via ORAL
  Filled 2021-01-11 (×3): qty 1

## 2021-01-11 MED ORDER — LORAZEPAM 2 MG/ML IJ SOLN
2.0000 mg | Freq: Once | INTRAMUSCULAR | Status: AC
  Administered 2021-01-11: 2 mg via INTRAMUSCULAR
  Filled 2021-01-11: qty 1

## 2021-01-11 MED ORDER — TAMSULOSIN HCL 0.4 MG PO CAPS
0.8000 mg | ORAL_CAPSULE | Freq: Every morning | ORAL | Status: DC
  Administered 2021-01-12 – 2021-01-14 (×3): 0.8 mg via ORAL
  Filled 2021-01-11 (×2): qty 2

## 2021-01-11 MED ORDER — ASCORBIC ACID 500 MG PO TABS
500.0000 mg | ORAL_TABLET | Freq: Every day | ORAL | Status: DC
  Administered 2021-01-12 – 2021-01-14 (×3): 500 mg via ORAL
  Filled 2021-01-11 (×3): qty 1

## 2021-01-11 MED ORDER — LORATADINE 10 MG PO TABS
10.0000 mg | ORAL_TABLET | Freq: Every day | ORAL | Status: DC
  Administered 2021-01-12 – 2021-01-14 (×3): 10 mg via ORAL
  Filled 2021-01-11 (×3): qty 1

## 2021-01-11 MED ORDER — TRAZODONE HCL 100 MG PO TABS
50.0000 mg | ORAL_TABLET | Freq: Every day | ORAL | Status: DC
  Administered 2021-01-12 – 2021-01-14 (×3): 50 mg via ORAL
  Filled 2021-01-11 (×3): qty 1

## 2021-01-11 MED ORDER — FAMOTIDINE 20 MG PO TABS
20.0000 mg | ORAL_TABLET | Freq: Two times a day (BID) | ORAL | Status: DC
  Administered 2021-01-12 – 2021-01-14 (×6): 20 mg via ORAL
  Filled 2021-01-11 (×6): qty 1

## 2021-01-11 MED ORDER — ACETAMINOPHEN 325 MG PO TABS: 650.0000 mg | ORAL_TABLET | Freq: Four times a day (QID) | ORAL | Status: DC | PRN

## 2021-01-11 MED ORDER — FOLIC ACID 1 MG PO TABS
1.0000 mg | ORAL_TABLET | Freq: Every day | ORAL | Status: DC
  Administered 2021-01-12 – 2021-01-14 (×3): 1 mg via ORAL
  Filled 2021-01-11 (×3): qty 1

## 2021-01-11 MED ORDER — DIVALPROEX SODIUM 250 MG PO DR TAB
250.0000 mg | DELAYED_RELEASE_TABLET | Freq: Two times a day (BID) | ORAL | Status: DC
  Administered 2021-01-12 – 2021-01-14 (×6): 250 mg via ORAL
  Filled 2021-01-11 (×5): qty 1

## 2021-01-11 MED ORDER — SUVOREXANT 10 MG PO TABS: 10.0000 mg | ORAL_TABLET | Freq: Every day | ORAL | Status: DC

## 2021-01-11 MED ORDER — HYDROXYZINE HCL 25 MG PO TABS: 25.0000 mg | ORAL_TABLET | Freq: Every evening | ORAL | Status: DC | PRN
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
## 2021-01-12 DIAGNOSIS — F201 Disorganized schizophrenia: Secondary | ICD-10-CM | POA: Diagnosis not present

## 2021-01-12 LAB — URINALYSIS, ROUTINE W REFLEX MICROSCOPIC
Bilirubin Urine: NEGATIVE
Glucose, UA: NEGATIVE mg/dL
Hgb urine dipstick: NEGATIVE
Ketones, ur: NEGATIVE mg/dL
Leukocytes,Ua: NEGATIVE
Nitrite: NEGATIVE
Protein, ur: NEGATIVE mg/dL
Specific Gravity, Urine: 1.012 (ref 1.005–1.030)
pH: 8 (ref 5.0–8.0)

## 2021-01-12 NOTE — TOC Progression Note (Signed)
Transition of Care [facility]) - Progression Note    Patient Details  Name: [name] MRN: [id number] Date of Birth: Jun 21, 1951  Transition of [facility]) CM/SW [place], [place] Phone Number:  [phone] 01/12/2021, 10:46 AM  Clinical Narrative:     CSW updated patient is psych cleared and can return to [facility] SNF/LTC [phone].  CSW spoke with Anderson Malta at [facility] and left message w/ contact information for the RN in [facility] unit to return my call to discuss discharge plan.       Expected Discharge Plan and Services                                                 Social Determinants of Health (SDOH) Interventions    Readmission Risk Interventions Readmission Risk Prevention Plan 11/28/2020  Transportation Screening Complete  Medication Review [employer]) Complete  HRI or Home Care Consult Complete  SW Recovery Care/Counseling Consult Complete  Palliative Care Screening Not Applicable  Skilled Nursing Facility Complete  Some recent data might be hidden
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
## 2021-01-12 NOTE — ED Notes (Signed)
purewick placed on patient with tech RM. Orange juice given to patient. Patient is non verbal and only makes sounds when attempting to communicate. Patient trying to get out of bed. This nurse and tech redirected patient to bed.
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
## 2021-01-12 NOTE — ED Provider Notes (Addendum)
Emergency Medicine Observation Re-evaluation Note  [name] is a 70 y.o. male, seen on rounds today.  Pt initially presented to the ED for complaints of Psychiatric Evaluation Currently, the patient is sitting up in bed, drinking.  Physical Exam  BP (!) 134/91   Pulse (!) 43   Temp 98.6 F (37 C) (Oral)   Resp 18   Ht 5\' 9"  (1.753 m)   Wt 65 kg   SpO2 100%   BMI 21.16 kg/m  Physical Exam General: calm, cooperative Cardiac: slightly bradycardic, but BP good, no symptoms Lungs: no increased WOB Psych: calm, cooperative  ED Course / MDM  EKG:EKG Interpretation  Date/Time:  Saturday Jan 11 2021 16:03:33 EDT Ventricular Rate:  63 PR Interval:    QRS Duration: 96 QT Interval:  424 QTC Calculation: 434 R Axis:   77 Text Interpretation: Atrial fibrillation orsinus rhythm artifact makes it difficult to find Pwaves axis normal No acute ischemia Confirmed by Lorre Munroe ([id number]) on 01/11/2021 4:43:21 PM   I have reviewed the labs performed to date as well as medications administered while in observation.  Recent changes in the last 24 hours include none.  Plan  Current plan is for awaiting TTS/TOC consult. Patient is not under full IVC at this time.  12:34 patient has been psychiatrically cleared.  [name] has decided not to accept the patient back due to aggressive behavior.  He has not been aggressive in the ED as of yet.  It was recommended during her recent hospitalization that patient be started on Saphris 5 mg twice daily as needed agitation.  It does not appear that this was started and patient will need to be given a prescription for this prior to discharge.   Malvin Johns, MD 01/12/21 0830    Malvin Johns, MD 01/12/21 1236
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
## 2021-01-13 DIAGNOSIS — F201 Disorganized schizophrenia: Secondary | ICD-10-CM | POA: Diagnosis not present

## 2021-01-13 NOTE — ED Notes (Signed)
Has difficulty feeding self because he spills everything on himself.  He was able to feed himself some applesauce.  Has not been any behavioral issues.

## 2021-01-13 NOTE — ED Notes (Signed)
Pt pleasant. Tries to communicate but is not intelligible. Needs pills to be crushed and put in applesauce.
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
## 2021-01-14 DIAGNOSIS — F201 Disorganized schizophrenia: Secondary | ICD-10-CM | POA: Diagnosis not present

## 2021-01-14 LAB — RESP PANEL BY RT-PCR (FLU A&B, COVID) ARPGX2
Influenza A by PCR: NEGATIVE
Influenza B by PCR: NEGATIVE
SARS Coronavirus 2 by RT PCR: NEGATIVE

## 2021-01-14 MED ORDER — ASENAPINE MALEATE 5 MG SL SUBL: 5.0000 mg | SUBLINGUAL_TABLET | Freq: Two times a day (BID) | SUBLINGUAL | 0 refills | Status: DC | PRN

## 2021-01-14 NOTE — ED Provider Notes (Signed)
Patient unchanged from yesterday.  Still awaiting placement to a new facility.   Lacretia Leigh, MD 01/14/21 [phone]

## 2021-01-14 NOTE — ED Notes (Signed)
Pt is 5th on list for PTAR

## 2021-01-14 NOTE — ED Notes (Signed)
Pt to [facility]

## 2021-01-14 NOTE — ED Notes (Signed)
PTAR called  

## 2021-01-14 NOTE — ED Notes (Signed)
Patient is alert.  Awaiting for PTAR- to be discharged to [facility]

## 2021-01-14 NOTE — ED Notes (Signed)
Pt alert this shift. Medication compliant. Pt confused, nonsensical. Cooperative with care. Pt restless at times, redirectable.    Pt needs help with ADLs

## 2021-01-14 NOTE — ED Provider Notes (Signed)
Behavioral health team request that patient is cleared to be discharged back to his facility.  Facility is willing to accept the patient if his as needed Saphris is updated, this medication has been ordered.  Plan for discharge.  Patient appears stable at discharge.   Lorelle Gibbs, DO 01/14/21 1616

## 2021-01-14 NOTE — Progress Notes (Signed)
Per [facility], pt can return to their facility but they need to be made aware of the PRN medications and they need to be added to the discharge paperwork. CSW updated [facility] supervisor who is assisting in making sure PRN medications are correct for facility. CSW has asked pts RN to complete a COVID rapid test. TOC to follow.

## 2021-01-14 NOTE — TOC Transition Note (Signed)
CSW updated that pts COVID test was negative. PRN meds added to AVS for facility. CSW confirmed pt can be transported back to facility. The number for report was provided to pts RN. CSW updated ED staff that pt could return. AVS faxed to [facility] per admissions request. TOC signing off.

## 2021-01-15 NOTE — ED Notes (Signed)
Pt resting comfortably/asleep. Respirations even and unlabored.

## 2021-01-15 NOTE — ED Notes (Signed)
Report given to PTAR. AVS given to transport team

## 2021-06-17 IMAGING — DX DG CHEST 1V PORT
1 series · 1 of 1 positions shown · non-contrast
Comparison: April 23, 2020

CLINICAL DATA: Altered mental status.

EXAM:
PORTABLE CHEST 1 VIEW

[chest]
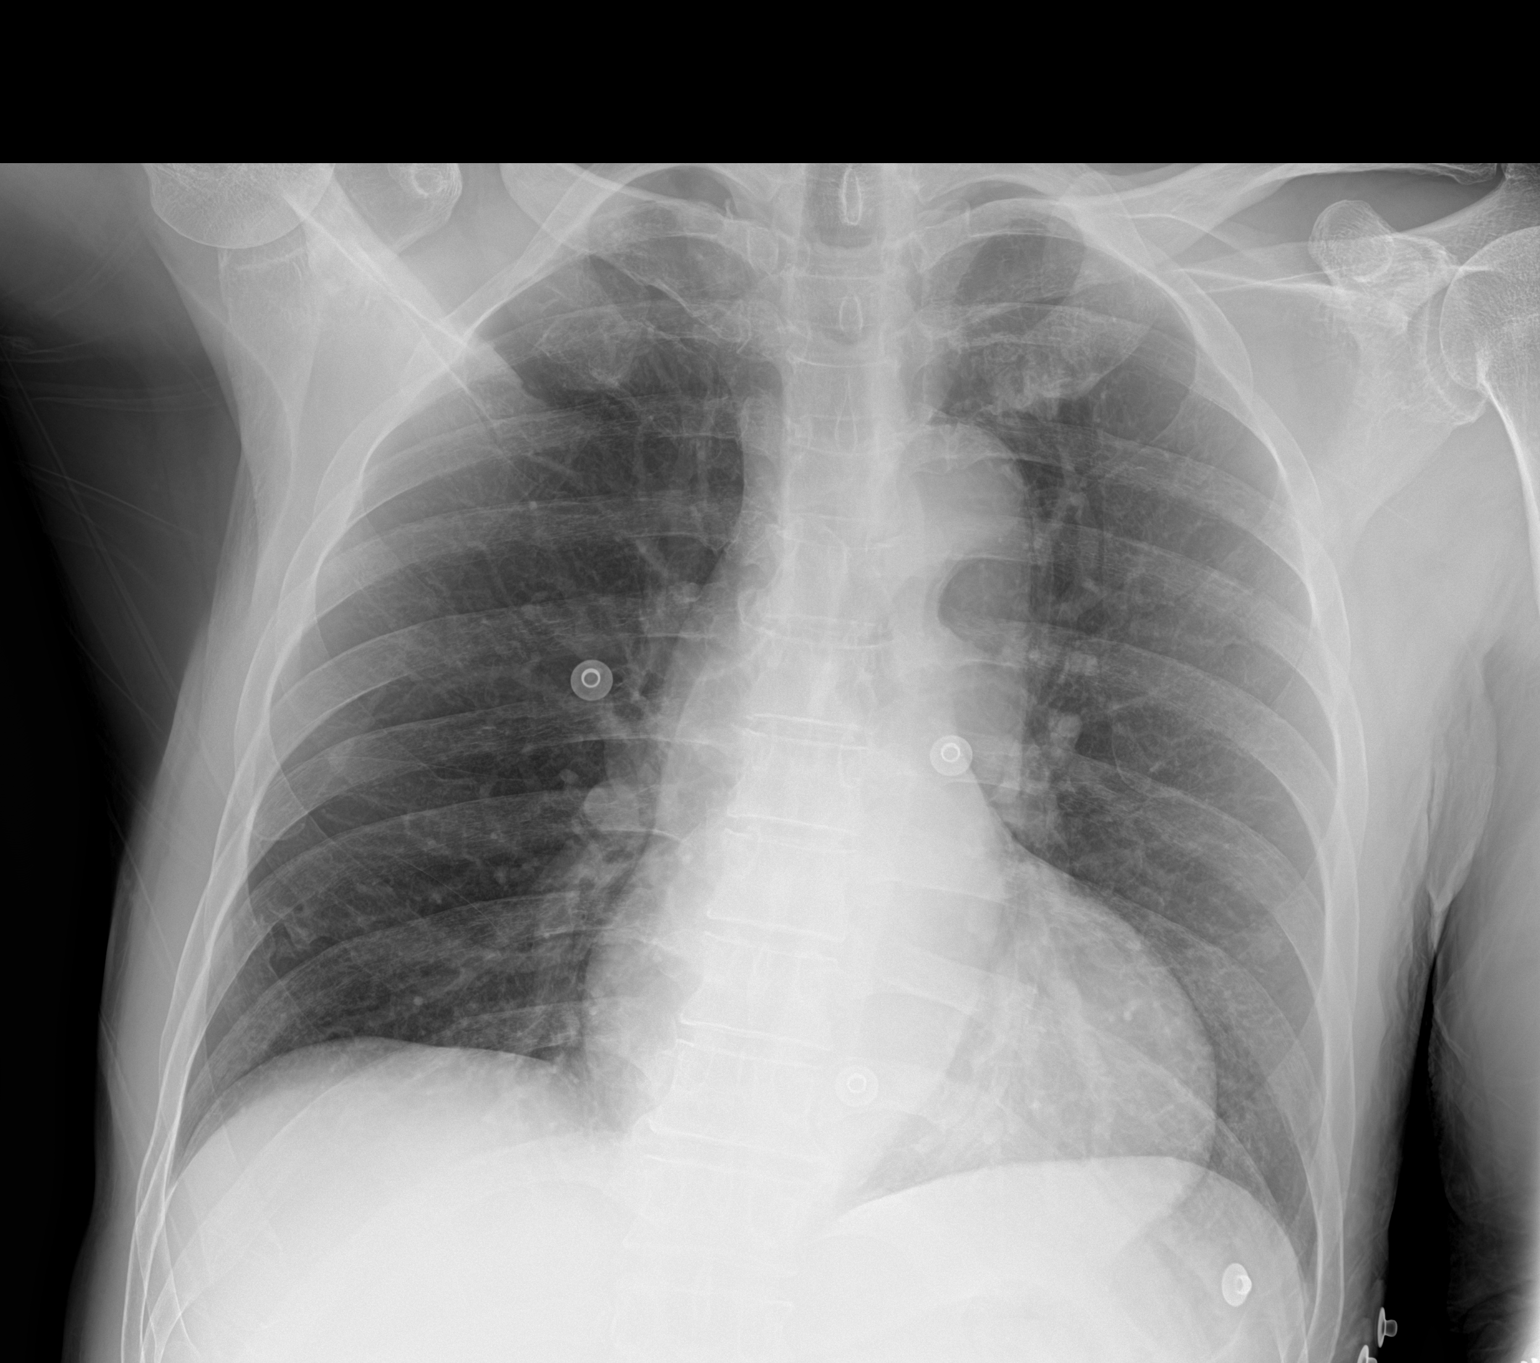

[1 of 1 positions shown; findings below may reference images not displayed]

FINDINGS: Cardiomediastinal silhouette is normal. Mediastinal contours appear
intact. Tortuosity of the aorta and calcific atherosclerotic disease
at the aortic knob.

There is no evidence of focal airspace consolidation, pleural
effusion or pneumothorax.

Osseous structures are without acute abnormality. Soft tissues are
grossly normal.
IMPRESSION: No active disease.

## 2021-06-17 IMAGING — CT CT HEAD W/O CM
4 series · 16 of 47 positions shown, 18 images · non-contrast
Comparison: CT head 04/23/2020

CLINICAL DATA: Delirium.

EXAM:
CT HEAD WITHOUT CONTRAST
TECHNIQUE: Contiguous axial images were obtained from the base of the skull
through the vertex without intravenous contrast.

[Series 3: head wo · axial · 0.45mm/px · z∈[-126,-6]mm · 7 of 33 slices shown, 9 images]
[im 5/33  brain]
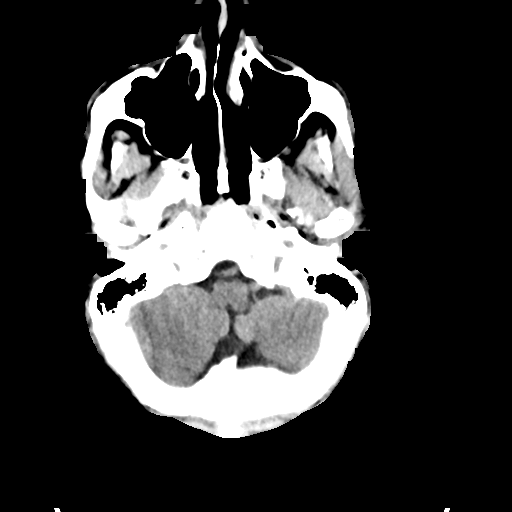
[im 5/33  bone]
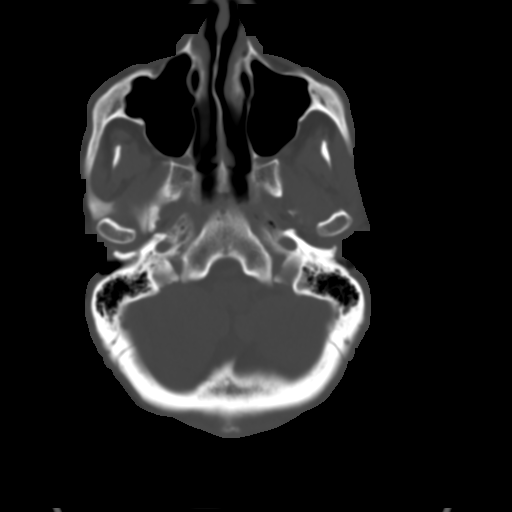
[im 9/33  brain]
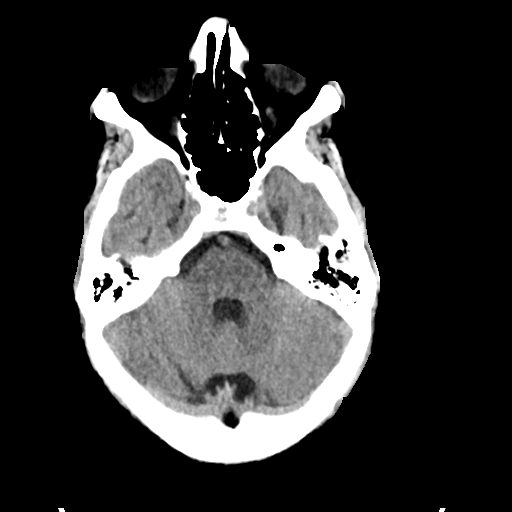
[im 13/33  brain]
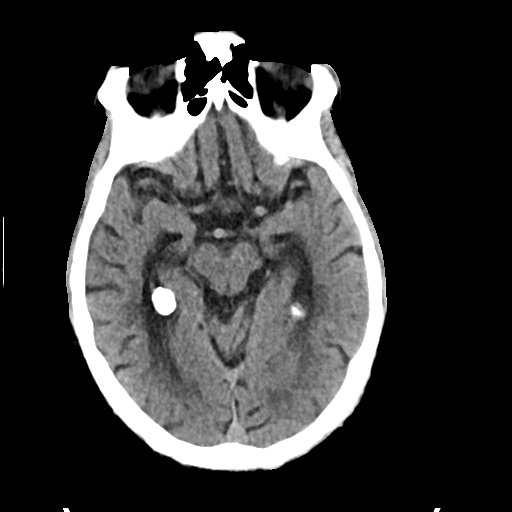
[im 17/33  brain]
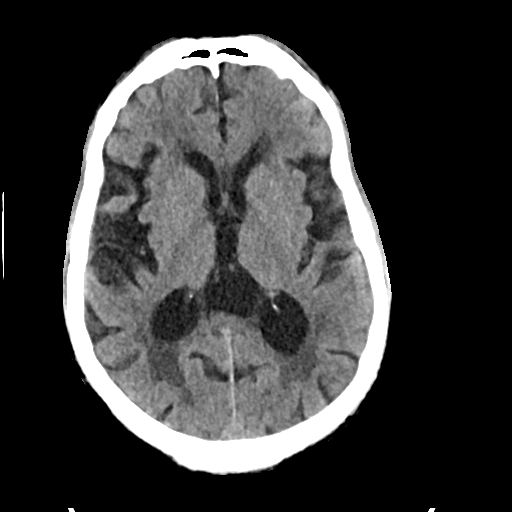
[im 21/33  brain]
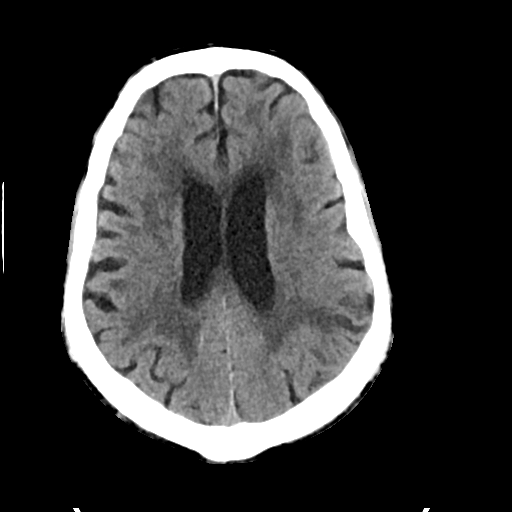
[im 21/33  bone]
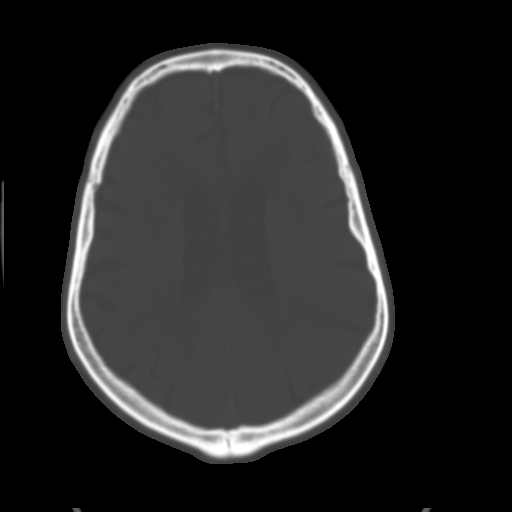
[im 25/33  brain]
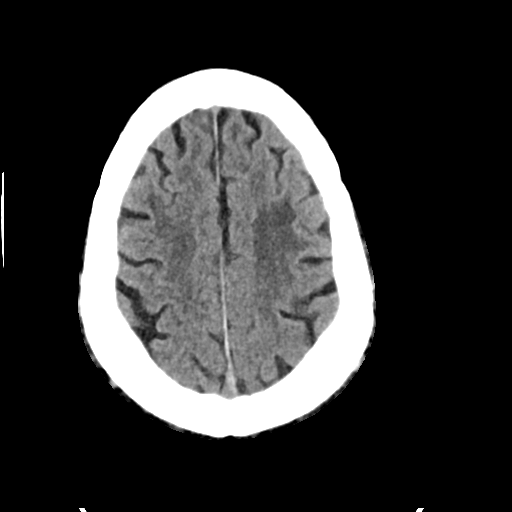
[im 29/33  brain]
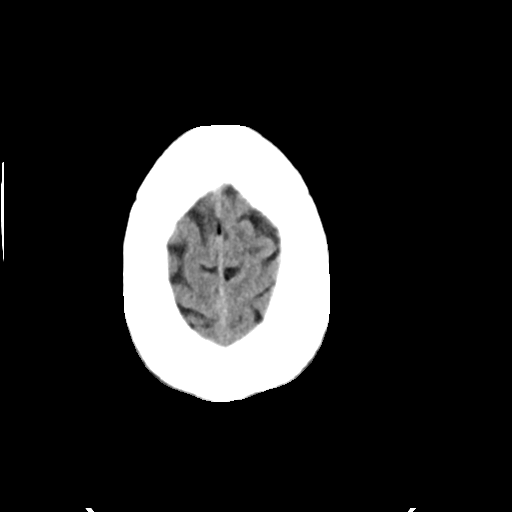

[Series 4: head bone · axial · 0.45mm/px · z∈[-130,-98]mm · 3 of 82 slices shown]
[im 9/82  bone]
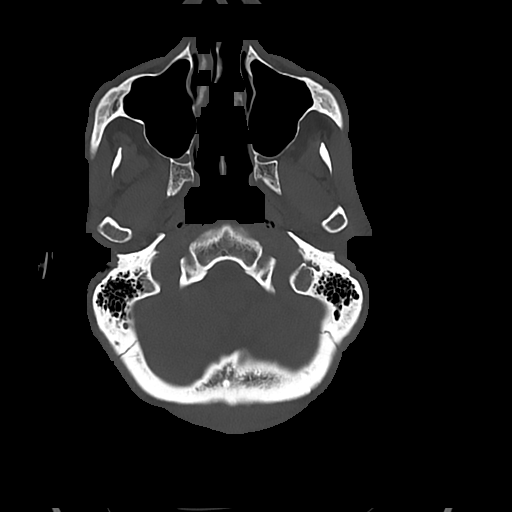
[im 17/82  bone]
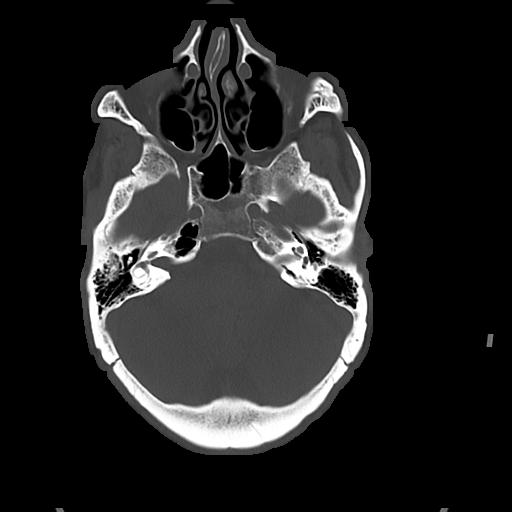
[im 25/82  bone]
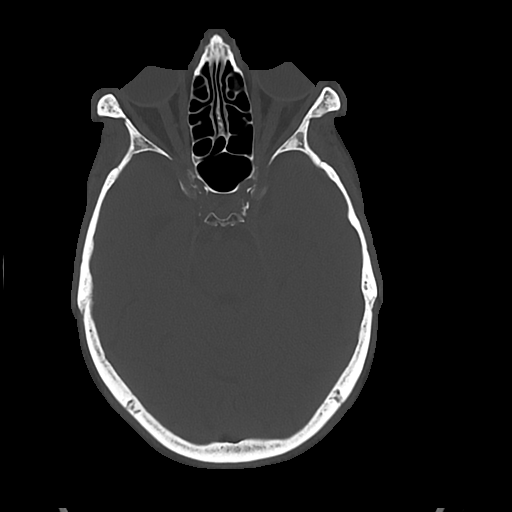

[Series 5: cor soft · coronal · 0.35mm/px · 3 of 70 slices shown]
[im 24/70  brain]
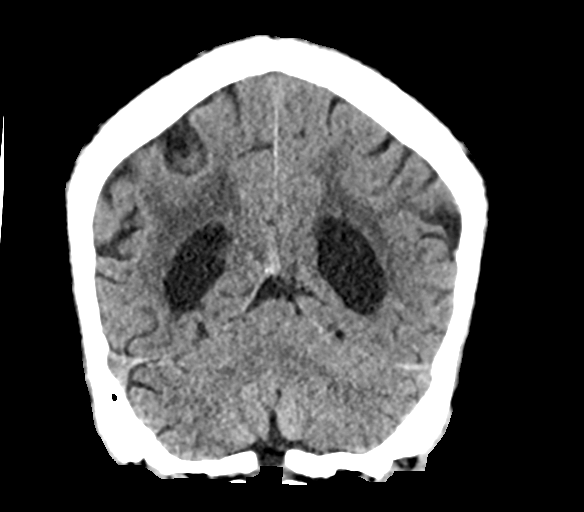
[im 31/70  brain]
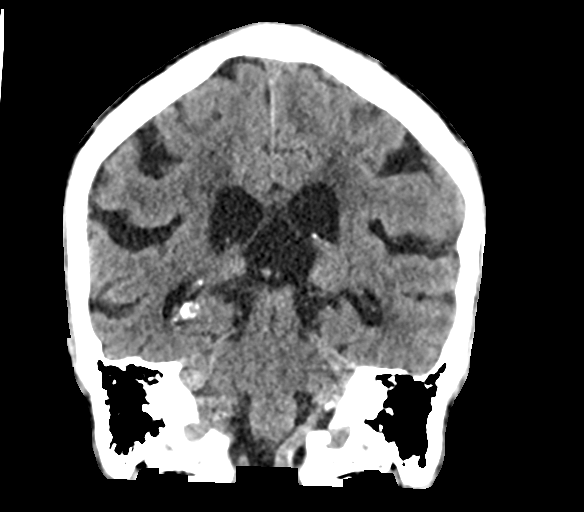
[im 39/70  brain]
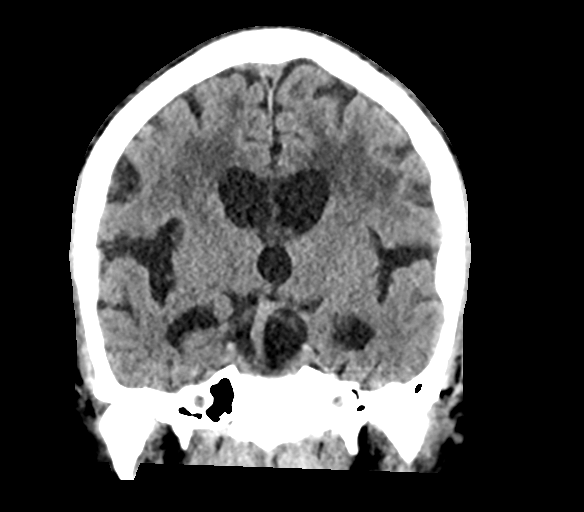

[Series 6: sag soft · sagittal · 0.37mm/px · 3 of 53 slices shown]
[im 18/53  brain]
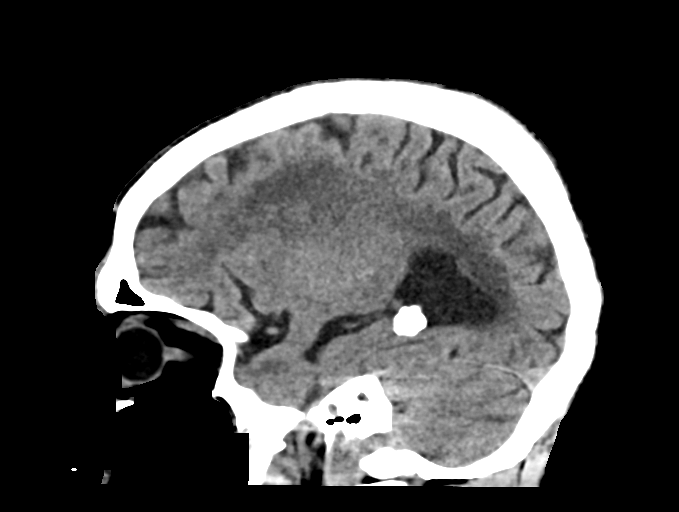
[im 27/53  brain]
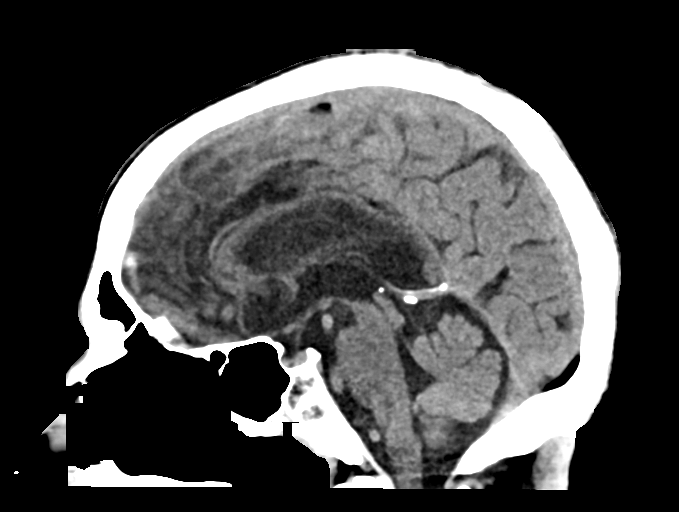
[im 35/53  brain]
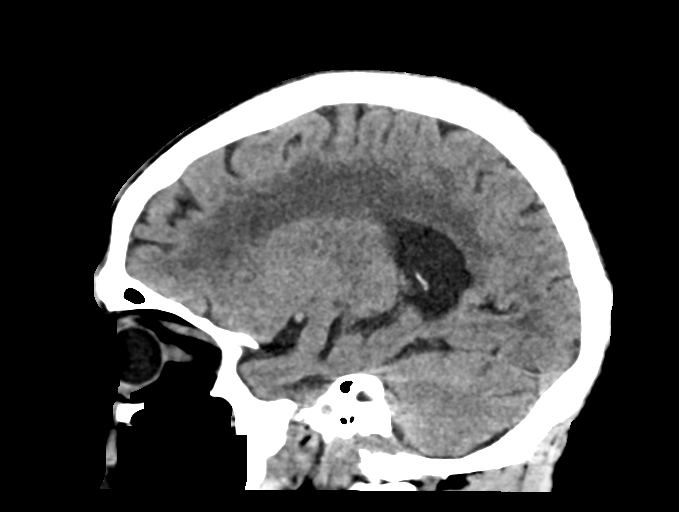

[16 of 47 positions shown; findings below may reference images not displayed]

FINDINGS: Brain: No evidence of acute infarction, hemorrhage, hydrocephalus,
extra-axial collection or mass lesion/mass effect. Similar advanced
white matter hypoattenuation, compatible with the sequela of chronic
microvascular ischemic disease. Similar mildly age advanced cerebral
atrophy with ex vacuo ventricular dilation. Similar small high left
parietal cortical infarct.

Vascular: Calcific atherosclerosis. No hyperdense vessel identified.

Skull: Normal. Negative for fracture or focal lesion.

Sinuses/Orbits: No acute finding.

Other: None.
IMPRESSION: 1. No evidence of acute intracranial abnormality.
2. Similar age advanced chronic microvascular ischemic disease,
generalized atrophy, and remote left parietal cortical infarct.

## 2022-06-13 ENCOUNTER — Inpatient Hospital Stay (HOSPITAL_COMMUNITY): Payer: Medicare Other

## 2022-06-13 ENCOUNTER — Inpatient Hospital Stay (HOSPITAL_COMMUNITY)
Admission: EM | Admit: 2022-06-13 | Discharge: 2022-06-16 | DRG: 871 | Disposition: A | Discharge status: Hospice | Payer: Medicare Other | Source: Skilled Nursing Facility | Attending: Internal Medicine | Admitting: Internal Medicine

## 2022-06-13 ENCOUNTER — Encounter (HOSPITAL_COMMUNITY): Payer: Self-pay

## 2022-06-13 ENCOUNTER — Other Ambulatory Visit: Payer: Self-pay

## 2022-06-13 DIAGNOSIS — R778 Other specified abnormalities of plasma proteins: Secondary | ICD-10-CM | POA: Diagnosis not present

## 2022-06-13 DIAGNOSIS — I5042 Chronic combined systolic (congestive) and diastolic (congestive) heart failure: Secondary | ICD-10-CM | POA: Diagnosis present

## 2022-06-13 DIAGNOSIS — Z7189 Other specified counseling: Secondary | ICD-10-CM

## 2022-06-13 DIAGNOSIS — F0390 Unspecified dementia without behavioral disturbance: Secondary | ICD-10-CM | POA: Diagnosis present

## 2022-06-13 DIAGNOSIS — Z880 Allergy status to penicillin: Secondary | ICD-10-CM

## 2022-06-13 DIAGNOSIS — E440 Moderate protein-calorie malnutrition: Secondary | ICD-10-CM

## 2022-06-13 DIAGNOSIS — E7800 Pure hypercholesterolemia, unspecified: Secondary | ICD-10-CM | POA: Diagnosis present

## 2022-06-13 DIAGNOSIS — F17210 Nicotine dependence, cigarettes, uncomplicated: Secondary | ICD-10-CM | POA: Diagnosis present

## 2022-06-13 DIAGNOSIS — N400 Enlarged prostate without lower urinary tract symptoms: Secondary | ICD-10-CM | POA: Diagnosis not present

## 2022-06-13 DIAGNOSIS — G9340 Encephalopathy, unspecified: Secondary | ICD-10-CM

## 2022-06-13 DIAGNOSIS — L8990 Pressure ulcer of unspecified site, unspecified stage: Secondary | ICD-10-CM | POA: Insufficient documentation

## 2022-06-13 DIAGNOSIS — E860 Dehydration: Secondary | ICD-10-CM | POA: Diagnosis present

## 2022-06-13 DIAGNOSIS — Z8249 Family history of ischemic heart disease and other diseases of the circulatory system: Secondary | ICD-10-CM

## 2022-06-13 DIAGNOSIS — I130 Hypertensive heart and chronic kidney disease with heart failure and stage 1 through stage 4 chronic kidney disease, or unspecified chronic kidney disease: Secondary | ICD-10-CM | POA: Diagnosis present

## 2022-06-13 DIAGNOSIS — F03C0 Unspecified dementia, severe, without behavioral disturbance, psychotic disturbance, mood disturbance, and anxiety: Secondary | ICD-10-CM

## 2022-06-13 DIAGNOSIS — R627 Adult failure to thrive: Secondary | ICD-10-CM | POA: Diagnosis present

## 2022-06-13 DIAGNOSIS — K5900 Constipation, unspecified: Secondary | ICD-10-CM | POA: Diagnosis not present

## 2022-06-13 DIAGNOSIS — Z66 Do not resuscitate: Secondary | ICD-10-CM | POA: Diagnosis not present

## 2022-06-13 DIAGNOSIS — Z888 Allergy status to other drugs, medicaments and biological substances status: Secondary | ICD-10-CM

## 2022-06-13 DIAGNOSIS — Z8619 Personal history of other infectious and parasitic diseases: Secondary | ICD-10-CM | POA: Diagnosis not present

## 2022-06-13 DIAGNOSIS — A419 Sepsis, unspecified organism: Secondary | ICD-10-CM | POA: Diagnosis not present

## 2022-06-13 DIAGNOSIS — E43 Unspecified severe protein-calorie malnutrition: Secondary | ICD-10-CM | POA: Diagnosis present

## 2022-06-13 DIAGNOSIS — G9349 Other encephalopathy: Secondary | ICD-10-CM | POA: Diagnosis present

## 2022-06-13 DIAGNOSIS — Z886 Allergy status to analgesic agent status: Secondary | ICD-10-CM

## 2022-06-13 DIAGNOSIS — E785 Hyperlipidemia, unspecified: Secondary | ICD-10-CM

## 2022-06-13 DIAGNOSIS — R6520 Severe sepsis without septic shock: Secondary | ICD-10-CM | POA: Diagnosis present

## 2022-06-13 DIAGNOSIS — I10 Essential (primary) hypertension: Secondary | ICD-10-CM | POA: Diagnosis not present

## 2022-06-13 DIAGNOSIS — Z1612 Extended spectrum beta lactamase (ESBL) resistance: Secondary | ICD-10-CM | POA: Diagnosis present

## 2022-06-13 DIAGNOSIS — M2459 Contracture, other specified joint: Secondary | ICD-10-CM | POA: Diagnosis present

## 2022-06-13 DIAGNOSIS — F32A Depression, unspecified: Secondary | ICD-10-CM | POA: Diagnosis present

## 2022-06-13 DIAGNOSIS — N179 Acute kidney failure, unspecified: Secondary | ICD-10-CM | POA: Diagnosis present

## 2022-06-13 DIAGNOSIS — Z681 Body mass index (BMI) 19 or less, adult: Secondary | ICD-10-CM

## 2022-06-13 DIAGNOSIS — Z79899 Other long term (current) drug therapy: Secondary | ICD-10-CM

## 2022-06-13 DIAGNOSIS — Z87440 Personal history of urinary (tract) infections: Secondary | ICD-10-CM

## 2022-06-13 DIAGNOSIS — N1831 Chronic kidney disease, stage 3a: Secondary | ICD-10-CM | POA: Diagnosis present

## 2022-06-13 DIAGNOSIS — R54 Age-related physical debility: Secondary | ICD-10-CM | POA: Diagnosis present

## 2022-06-13 DIAGNOSIS — Z515 Encounter for palliative care: Secondary | ICD-10-CM | POA: Diagnosis not present

## 2022-06-13 DIAGNOSIS — E870 Hyperosmolality and hypernatremia: Secondary | ICD-10-CM | POA: Diagnosis present

## 2022-06-13 DIAGNOSIS — F200 Paranoid schizophrenia: Secondary | ICD-10-CM | POA: Diagnosis not present

## 2022-06-13 DIAGNOSIS — R7989 Other specified abnormal findings of blood chemistry: Secondary | ICD-10-CM | POA: Diagnosis present

## 2022-06-13 DIAGNOSIS — Z86010 Personal history of colonic polyps: Secondary | ICD-10-CM

## 2022-06-13 DIAGNOSIS — K219 Gastro-esophageal reflux disease without esophagitis: Secondary | ICD-10-CM | POA: Diagnosis present

## 2022-06-13 DIAGNOSIS — I2489 Other forms of acute ischemic heart disease: Secondary | ICD-10-CM | POA: Diagnosis present

## 2022-06-13 DIAGNOSIS — I480 Paroxysmal atrial fibrillation: Secondary | ICD-10-CM | POA: Diagnosis present

## 2022-06-13 DIAGNOSIS — I3139 Other pericardial effusion (noninflammatory): Secondary | ICD-10-CM | POA: Diagnosis present

## 2022-06-13 DIAGNOSIS — L89153 Pressure ulcer of sacral region, stage 3: Secondary | ICD-10-CM | POA: Diagnosis present

## 2022-06-13 DIAGNOSIS — R52 Pain, unspecified: Secondary | ICD-10-CM

## 2022-06-13 DIAGNOSIS — Z7401 Bed confinement status: Secondary | ICD-10-CM

## 2022-06-13 DIAGNOSIS — I5032 Chronic diastolic (congestive) heart failure: Secondary | ICD-10-CM | POA: Diagnosis not present

## 2022-06-13 DIAGNOSIS — B9620 Unspecified Escherichia coli [E. coli] as the cause of diseases classified elsewhere: Secondary | ICD-10-CM | POA: Diagnosis present

## 2022-06-13 DIAGNOSIS — N390 Urinary tract infection, site not specified: Secondary | ICD-10-CM | POA: Diagnosis present

## 2022-06-13 DIAGNOSIS — I5030 Unspecified diastolic (congestive) heart failure: Secondary | ICD-10-CM | POA: Diagnosis present

## 2022-06-13 DIAGNOSIS — Z86718 Personal history of other venous thrombosis and embolism: Secondary | ICD-10-CM

## 2022-06-13 LAB — URINALYSIS, ROUTINE W REFLEX MICROSCOPIC
Bilirubin Urine: NEGATIVE
Glucose, UA: NEGATIVE mg/dL
Ketones, ur: NEGATIVE mg/dL
Nitrite: POSITIVE — AB
Protein, ur: 30 mg/dL — AB
Specific Gravity, Urine: 1.025 (ref 1.005–1.030)
pH: 6 (ref 5.0–8.0)

## 2022-06-13 LAB — COMPREHENSIVE METABOLIC PANEL
ALT: 49 U/L — ABNORMAL HIGH (ref 0–44)
AST: 53 U/L — ABNORMAL HIGH (ref 15–41)
Albumin: 2.6 g/dL — ABNORMAL LOW (ref 3.5–5.0)
Alkaline Phosphatase: 87 U/L (ref 38–126)
Anion gap: 5 (ref 5–15)
BUN: 48 mg/dL — ABNORMAL HIGH (ref 8–23)
CO2: 27 mmol/L (ref 22–32)
Calcium: 8.7 mg/dL — ABNORMAL LOW (ref 8.9–10.3)
Chloride: 130 mmol/L — ABNORMAL HIGH (ref 98–111)
Creatinine, Ser: 1.71 mg/dL — ABNORMAL HIGH (ref 0.61–1.24)
GFR, Estimated: 43 mL/min — ABNORMAL LOW (ref >60)
Glucose, Bld: 119 mg/dL — ABNORMAL HIGH (ref 70–99)
Potassium: 3.8 mmol/L (ref 3.5–5.1)
Sodium: 162 mmol/L (ref 135–145)
Total Bilirubin: 0.7 mg/dL (ref 0.3–1.2)
Total Protein: 7.1 g/dL (ref 6.5–8.1)

## 2022-06-13 LAB — CBC WITH DIFFERENTIAL/PLATELET
Abs Immature Granulocytes: 0.02 10*3/uL (ref 0.00–0.07)
Basophils Absolute: 0 10*3/uL (ref 0.0–0.1)
Basophils Relative: 0 %
Eosinophils Absolute: 0 10*3/uL (ref 0.0–0.5)
Eosinophils Relative: 0 %
HCT: 39 % (ref 39.0–52.0)
Hemoglobin: 11.8 g/dL — ABNORMAL LOW (ref 13.0–17.0)
Immature Granulocytes: 0 %
Lymphocytes Relative: 17 %
Lymphs Abs: 1.2 10*3/uL (ref 0.7–4.0)
MCH: 28.6 pg (ref 26.0–34.0)
MCHC: 30.3 g/dL (ref 30.0–36.0)
MCV: 94.4 fL (ref 80.0–100.0)
Monocytes Absolute: 0.3 10*3/uL (ref 0.1–1.0)
Monocytes Relative: 4 %
Neutro Abs: 5.8 10*3/uL (ref 1.7–7.7)
Neutrophils Relative %: 79 %
Platelets: 132 10*3/uL — ABNORMAL LOW (ref 150–400)
RBC: 4.13 MIL/uL — ABNORMAL LOW (ref 4.22–5.81)
RDW: 14.7 % (ref 11.5–15.5)
Smear Review: DECREASED
WBC: 7.3 10*3/uL (ref 4.0–10.5)
nRBC: 0 % (ref 0.0–0.2)

## 2022-06-13 LAB — URINALYSIS, MICROSCOPIC (REFLEX)
Squamous Epithelial / HPF: NONE SEEN (ref 0–5)
WBC, UA: >50 WBC/hpf (ref 0–5)

## 2022-06-13 MED ORDER — SODIUM CHLORIDE 0.9 % IV SOLN: INTRAVENOUS | Status: DC

## 2022-06-13 MED ORDER — SODIUM CHLORIDE 0.9 % IV BOLUS
1000.0000 mL | Freq: Once | INTRAVENOUS | Status: AC
  Administered 2022-06-13: 1000 mL via INTRAVENOUS

## 2022-06-13 MED ORDER — THIAMINE HCL 100 MG/ML IJ SOLN
100.0000 mg | Freq: Every day | INTRAMUSCULAR | Status: DC
  Administered 2022-06-14 – 2022-06-16 (×4): 100 mg via INTRAVENOUS
  Filled 2022-06-13 (×3): qty 2

## 2022-06-13 NOTE — ED Triage Notes (Signed)
Pt sent from [facility] for high sodium.  Received the abnormal on the blood work today.

## 2022-06-13 NOTE — H&P (Incomplete)
MIKHAIL HALLENBECK IRJ:[id number] DOB: Dec 07, 1950 DOA: 06/13/2022     PCP: Jacelyn Grip, MD   Outpatient Specialists: * NONE CARDS: * Dr. NEphrology: *  Dr. NEurology *   Dr. Pulmonary *  Dr.  Oncology * Dr. Fabienne Bruns* Dr.  Sadie Haber, [facility]) No care team member to display Urology Dr. *  Patient arrived to ER on 06/13/22 at 2014 Referred by Attending Drenda Freeze, MD   Patient coming from:     From facility [facility]  Chief Complaint:   Chief Complaint  Patient presents with   abnormal labs    HPI: JAMICAH ANSTEAD is a 71 y.o. male with medical history significant of schizophrenia, dementia, chronic kidney disease stage IIIa, recent history of ESBL UTI , a.fib, dementia,   Presented with   hypernatremia  From SNF with elevated sodium Not eating not drinking   Recenly ws treated for UTI with Cefepime Regarding pertinent Chronic problems:       HTN on NOrvasc   chronic CHF diastolic/systolic/ combined - last echoestimation, is 60 to 65%.  Done in 2021  Hx of a.fib not on anticoagulation    CKD stage IIIb- baseline Cr 1.4 Estimated Creatinine Clearance: 34.8 mL/min (A) (by C-G formula based on SCr of 1.71 mg/dL (H)).  Lab Results  Component Value Date   CREATININE 1.71 (H) 06/13/2022   CREATININE 1.42 (H) 01/11/2021   CREATININE 1.30 (H) 11/30/2020     Dementia - on Aricept** [place]    While in ER:       Ordered  CT HEAD *** NON acute  CXR - ***NON acute  CTabd/pelvis - ***nonacute  CTA chest - ***nonacute, no PE, * no evidence of infiltrate  Following Medications were ordered in ER: Medications  0.9 %  sodium chloride infusion ( Intravenous New Bag/Given 06/13/22 2243)  sodium chloride 0.9 % bolus 1,000 mL (1,000 mLs Intravenous New Bag/Given 06/13/22 2118)    _____    ED Triage Vitals  Enc Vitals Group     BP 06/13/22 2036 (!) 113/92     Pulse Rate 06/13/22 2036 (!) 118     Resp 06/13/22 2036 18     Temp 06/13/22 2036 97.6 F (36.4 C)      Temp Source 06/13/22 2036 Oral     SpO2 06/13/22 2036 98 %     Weight 06/13/22 2037 135 lb (61.2 kg)     Height 06/13/22 2037 '5\' 9"'$  (1.753 m)     Head Circumference --      Peak Flow --      Pain Score 06/13/22 2037 0     Pain Loc --      Pain Edu? --      Excl. in [place]? --   TMAX(24)@     _________________________________________ Significant initial  Findings: Abnormal Labs Reviewed  COMPREHENSIVE METABOLIC PANEL - Abnormal; Notable for the following components:      Result Value   Sodium 162 (*)    Chloride 130 (*)    Glucose, Bld 119 (*)    BUN 48 (*)    Creatinine, Ser 1.71 (*)    Calcium 8.7 (*)    Albumin 2.6 (*)    AST 53 (*)    ALT 49 (*)    GFR, Estimated 43 (*)    All other components within normal limits     _________________________ Troponin ***ordered ECG: Ordered Personally reviewed and interpreted by me showing: HR : 119 Rhythm:Sinus or ectopic  atrial tachycardia Nonspecific T abnormalities, inferior leads Baseline wander in lead(s) V1 QTC 432   _______   The recent clinical data is shown below. Vitals:   06/13/22 2036 06/13/22 2037 06/13/22 2130  BP: (!) 113/92  128/84  Pulse: (!) 118  (!) 102  Resp: 18  16  Temp: 97.6 F (36.4 C)    TempSrc: Oral    SpO2: 98%  97%  Weight:  61.2 kg   Height:  '5\' 9"'$  (1.753 m)       WBC     Component Value Date/Time   WBC 4.4 01/11/2021 1559   LYMPHSABS 1.9 01/11/2021 1559   MONOABS 0.4 01/11/2021 1559   EOSABS 0.1 01/11/2021 1559   BASOSABS 0.0 01/11/2021 1559      Procalcitonin *** Ordered Lactic Acid, Venous    Component Value Date/Time   LATICACIDVEN 2.2 (HH) 11/09/2020 1311     Procalcitonin *** Ordered   UA *** no evidence of UTI  ***Pending ***not ordered   Urine analysis:    Component Value Date/Time   COLORURINE YELLOW 01/11/2021 [place] 01/11/2021 1537   LABSPEC 1.012 01/11/2021 1537   PHURINE 8.0 01/11/2021 1537   GLUCOSEU NEGATIVE 01/11/2021 1537   HGBUR  NEGATIVE 01/11/2021 1537   HGBUR negative 08/27/2010 0854   BILIRUBINUR NEGATIVE 01/11/2021 1537   BILIRUBINUR negative 08/02/2018 1555   BILIRUBINUR NEG 07/27/2014 1518   KETONESUR NEGATIVE 01/11/2021 1537   PROTEINUR NEGATIVE 01/11/2021 1537   UROBILINOGEN 0.2 08/02/2018 1555   UROBILINOGEN 0.2 08/27/2010 0854   NITRITE NEGATIVE 01/11/2021 1537   LEUKOCYTESUR NEGATIVE 01/11/2021 1537    Results for orders placed or performed during the hospital encounter of 01/11/21  Resp Panel by RT-PCR (Flu A&B, Covid) Nasopharyngeal Swab     Status: None   Collection Time: 01/11/21  3:59 PM   Specimen: Nasopharyngeal Swab; Nasopharyngeal(NP) swabs in vial transport medium  Result Value Ref Range Status   SARS Coronavirus 2 by RT PCR NEGATIVE NEGATIVE Final    Comment: (NOTE) SARS-CoV-2 target nucleic acids are NOT DETECTED.  The SARS-CoV-2 RNA is generally detectable in upper respiratory specimens during the acute phase of infection. The lowest concentration of SARS-CoV-2 viral copies this assay can detect is 138 copies/mL. A negative result does not preclude SARS-Cov-2 infection and should not be used as the sole basis for treatment or other patient management decisions. A negative result may occur with  improper specimen collection/handling, submission of specimen other than nasopharyngeal swab, presence of viral mutation(s) within the areas targeted by this assay, and inadequate number of viral copies(<138 copies/mL). A negative result must be combined with clinical observations, patient history, and epidemiological information. The expected result is Negative.  Fact Sheet for Patients:  EntrepreneurPulse.com.au  Fact Sheet for Healthcare Providers:  IncredibleEmployment.be  This test is no t yet approved or cleared by the [place] FDA and  has been authorized for detection and/or diagnosis of SARS-CoV-2 by FDA under an Emergency Use  Authorization (EUA). This EUA will remain  in effect (meaning this test can be used) for the duration of the COVID-19 declaration under Section 564(b)(1) of the Act, 21 U.S.C.section 360bbb-3(b)(1), unless the authorization is terminated  or revoked sooner.       Influenza A by PCR NEGATIVE NEGATIVE Final   Influenza B by PCR NEGATIVE NEGATIVE Final    Comment: (NOTE) The Xpert Xpress SARS-CoV-2/FLU/RSV plus assay is intended as an aid in the diagnosis of influenza from Nasopharyngeal swab  specimens and should not be used as a sole basis for treatment. Nasal washings and aspirates are unacceptable for Xpert Xpress SARS-CoV-2/FLU/RSV testing.  Fact Sheet for Patients: EntrepreneurPulse.com.au  Fact Sheet for Healthcare Providers: IncredibleEmployment.be  This test is not yet approved or cleared by the [place] FDA and has been authorized for detection and/or diagnosis of SARS-CoV-2 by FDA under an Emergency Use Authorization (EUA). This EUA will remain in effect (meaning this test can be used) for the duration of the COVID-19 declaration under Section 564(b)(1) of the Act, 21 U.S.C. section 360bbb-3(b)(1), unless the authorization is terminated or revoked.  Performed at [facility], [place] [address], [place], [place] [address]   Resp Panel by RT-PCR (Flu A&B, Covid) Nasopharyngeal Swab     Status: None   Collection Time: 01/14/21  2:57 PM   Specimen: Nasopharyngeal Swab; Nasopharyngeal(NP) swabs in vial transport medium  Result Value Ref Range Status   SARS Coronavirus 2 by RT PCR NEGATIVE NEGATIVE Final    Comment: (NOTE) SARS-CoV-2 target nucleic acids are NOT DETECTED.  The SARS-CoV-2 RNA is generally detectable in upper respiratory specimens during the acute phase of infection. The lowest concentration of SARS-CoV-2 viral copies this assay can detect is 138 copies/mL. A negative result does not preclude  SARS-Cov-2 infection and should not be used as the sole basis for treatment or other patient management decisions. A negative result may occur with  improper specimen collection/handling, submission of specimen other than nasopharyngeal swab, presence of viral mutation(s) within the areas targeted by this assay, and inadequate number of viral copies(<138 copies/mL). A negative result must be combined with clinical observations, patient history, and epidemiological information. The expected result is Negative.  Fact Sheet for Patients:  EntrepreneurPulse.com.au  Fact Sheet for Healthcare Providers:  IncredibleEmployment.be  This test is no t yet approved or cleared by the [place] FDA and  has been authorized for detection and/or diagnosis of SARS-CoV-2 by FDA under an Emergency Use Authorization (EUA). This EUA will remain  in effect (meaning this test can be used) for the duration of the COVID-19 declaration under Section 564(b)(1) of the Act, 21 U.S.C.section 360bbb-3(b)(1), unless the authorization is terminated  or revoked sooner.       Influenza A by PCR NEGATIVE NEGATIVE Final   Influenza B by PCR NEGATIVE NEGATIVE Final    Comment: (NOTE) The Xpert Xpress SARS-CoV-2/FLU/RSV plus assay is intended as an aid in the diagnosis of influenza from Nasopharyngeal swab specimens and should not be used as a sole basis for treatment. Nasal washings and aspirates are unacceptable for Xpert Xpress SARS-CoV-2/FLU/RSV testing.  Fact Sheet for Patients: EntrepreneurPulse.com.au  Fact Sheet for Healthcare Providers: IncredibleEmployment.be  This test is not yet approved or cleared by the [place] FDA and has been authorized for detection and/or diagnosis of SARS-CoV-2 by FDA under an Emergency Use Authorization (EUA). This EUA will remain in effect (meaning this test can be used) for the duration of  the COVID-19 declaration under Section 564(b)(1) of the Act, 21 U.S.C. section 360bbb-3(b)(1), unless the authorization is terminated or revoked.  Performed at [facility], [place] [address] [place],  [address]      _______________________________________________ Hospitalist was called for admission for *** There are no diagnoses linked to this encounter.   The following Work up has been ordered so far:  Orders Placed This Encounter  Procedures   CBC with Differential   Comprehensive metabolic panel   Urinalysis, Routine w reflex  microscopic   Consult to hospitalist   EKG 12-Lead   Saline lock IV     OTHER Significant initial  Findings:  labs showing:    Recent Labs  Lab 06/13/22 2105  NA 162*  K 3.8  CO2 27  GLUCOSE 119*  BUN 48*  CREATININE 1.71*  CALCIUM 8.7*    Cr  * stable,  Up from baseline see below Lab Results  Component Value Date   CREATININE 1.71 (H) 06/13/2022   CREATININE 1.42 (H) 01/11/2021   CREATININE 1.30 (H) 11/30/2020    Recent Labs  Lab 06/13/22 2105  AST 53*  ALT 49*  ALKPHOS 87  BILITOT 0.7  PROT 7.1  ALBUMIN 2.6*   Lab Results  Component Value Date   CALCIUM 8.7 (L) 06/13/2022   PHOS 3.2 10/28/2020          Plt: Lab Results  Component Value Date   PLT 116 (L) 01/11/2021       COVID-19 Labs  No results for input(s): "DDIMER", "FERRITIN", "LDH", "CRP" in the last 72 hours.  Lab Results  Component Value Date   SARSCOV2NAA NEGATIVE 01/14/2021   [place] NEGATIVE 01/11/2021   [place] NEGATIVE 12/02/2020   [place] NEGATIVE 11/27/2020     Arterial ***Venous  Blood Gas result:  pH *** pCO2 ***; pO2 ***;     %O2 Sat ***.  ABG    Component Value Date/Time   PHART 7.377 01/21/2020 0704   PCO2ART 41.2 01/21/2020 0704   PO2ART 93 01/21/2020 0704   HCO3 28.0 04/24/2020 0026   TCO2 29 04/24/2020 0026   ACIDBASEDEF 1.0 01/21/2020 0704   O2SAT 99.0 04/24/2020 0026         No results for input(s): "WBC", "NEUTROABS", "HGB", "HCT", "MCV", "PLT" in the last 168 hours.  HG/HCT * stable,  Down *Up from baseline see below    Component Value Date/Time   HGB 12.1 (L) 01/11/2021 1559   HGB 14.4 04/13/2018 1057   HCT 37.2 (L) 01/11/2021 1559   HCT 41.6 04/13/2018 1057   MCV 90.1 01/11/2021 1559   MCV 88 04/13/2018 1057      No results for input(s): "LIPASE", "AMYLASE" in the last 168 hours. No results for input(s): "AMMONIA" in the last 168 hours.    Cardiac Panel (last 3 results) No results for input(s): "CKTOTAL", "CKMB", "TROPONINI", "RELINDX" in the last 72 hours.  .car BNP (last 3 results) No results for input(s): "BNP" in the last 8760 hours.    DM  labs:  HbA1C: No results for input(s): "HGBA1C" in the last 8760 hours.     CBG (last 3)  No results for input(s): "GLUCAP" in the last 72 hours.        Cultures:    Component Value Date/Time   SDES  11/27/2020 2350    BLOOD BLOOD RIGHT FOREARM Performed at [facility], [place] [address], [place], [place] [address]    SDES  11/27/2020 2350    BLOOD BLOOD RIGHT HAND Performed at [facility], [place] [address] [place], [place] [address]    [place]  11/27/2020 2350    BOTTLES DRAWN AEROBIC AND ANAEROBIC Blood Culture adequate volume Performed at [facility] [address], [place], [place] [address]    [place]  11/27/2020 2350    BOTTLES DRAWN AEROBIC AND ANAEROBIC Blood Culture results may not be optimal due to an inadequate volume of blood received in culture bottles Performed at [facility],  [place] [address] [place], [place] [address]    CULT  11/27/2020 2350    NO GROWTH 5 DAYS Performed at [facility], [place] [address] [place], [place] [address]    CULT  11/27/2020 2350    NO GROWTH 5 DAYS Performed at [facility], [place] [address], [place], [place] [address]    REPTSTATUS 12/03/2020  FINAL 11/27/2020 2350   REPTSTATUS 12/03/2020 FINAL 11/27/2020 2350     Radiological Exams on Admission: No results found. _______________________________________________________________________________________________________ Latest  Blood pressure 128/84, pulse (!) 102, temperature 97.6 F (36.4 C), temperature source Oral, resp. rate 16, height '5\' 9"'$  (1.753 m), weight 61.2 kg, SpO2 97 %.   Vitals  labs and radiology finding personally reviewed  Review of Systems:    Pertinent positives include: ***  Constitutional:  No weight loss, night sweats, Fevers, chills, fatigue, weight loss  HEENT:  No headaches, Difficulty swallowing,Tooth/dental problems,Sore throat,  No sneezing, itching, ear ache, nasal congestion, post nasal drip,  Cardio-vascular:  No chest pain, Orthopnea, PND, anasarca, dizziness, palpitations.no Bilateral lower extremity swelling  GI:  No heartburn, indigestion, abdominal pain, nausea, vomiting, diarrhea, change in bowel habits, loss of appetite, melena, blood in stool, hematemesis Resp:  no shortness of breath at rest. No dyspnea on exertion, No excess mucus, no productive cough, No non-productive cough, No coughing up of blood.No change in color of mucus.No wheezing. Skin:  no rash or lesions. No jaundice GU:  no dysuria, change in color of urine, no urgency or frequency. No straining to urinate.  No flank pain.  Musculoskeletal:  No joint pain or no joint swelling. No decreased range of motion. No back pain.  Psych:  No change in mood or affect. No depression or anxiety. No memory loss.  Neuro: no localizing neurological complaints, no tingling, no weakness, no double vision, no gait abnormality, no slurred speech, no confusion  All systems reviewed and apart from [place] all are negative _______________________________________________________________________________________________ Past Medical History:   Past Medical History:  Diagnosis Date   Acute  encephalopathy 12/15/256   Acute metabolic encephalopathy 12/16/7780   AKI (acute kidney injury) ([facility]) 05/12/2020   BPH (benign prostatic hyperplasia)    Closed displaced fracture of fifth metatarsal bone of right foot    Colon polyp 2010   Crush injury    Dementia ([facility])    Depression    GERD (gastroesophageal reflux disease)    Hepatitis C    "caught it when I had a blood transfusion"   High cholesterol    History of blood transfusion    "when I was young"   Hypertension    Paranoid schizophrenia ([facility])    PARANOID SCHIZOPHRENIA, CHRONIC 01/17/2009   Prostate atrophy    Retinal vein occlusion       Past Surgical History:  Procedure Laterality Date   CIRCUMCISION  1959   ORIF TOE FRACTURE Right 04/30/2020   Procedure: OPEN REDUCTION INTERNAL FIXATION (ORIF) RIGHT FOOT METATARSAL FRACTURES;  Surgeon: Newt Minion, MD;  Location: [facility];  Service: Orthopedics;  Laterality: Right;   TONSILLECTOMY      Social History:  Ambulatory *** independently cane, walker  wheelchair bound, bed bound     reports that he has been smoking cigarettes. He has a 24.00 pack-year smoking history. He has never used smokeless tobacco. He reports current alcohol use. He reports that he does not use drugs.     Family History: *** Family History  Problem Relation Age of  Onset   Hypertension Mother    Diabetes Mother    Stroke Mother    ______________________________________________________________________________________________ Allergies: Allergies  Allergen Reactions   Lisinopril Cough   Penicillins Hives   Amoxicillin Rash   Ibuprofen Rash     Prior to Admission medications   Medication Sig Start Date End Date Taking? Authorizing Provider  acetaminophen (TYLENOL) 325 MG tablet Take 2 tablets (650 mg total) by mouth every 6 (six) hours as needed for mild pain, moderate pain or headache. 05/09/20   Mullis, Kiersten P, DO  amLODipine (NORVASC) 10 MG tablet Take 1 tablet (10 mg total) by  mouth daily. 07/08/20   Alcus Dad, MD  asenapine (SAPHRIS) 5 MG SUBL 24 hr tablet Place 1 tablet (5 mg total) under the tongue 2 (two) times daily as needed (agitation/aggression). 01/14/21   Horton, Alvin Critchley, DO  benztropine (COGENTIN) 1 MG tablet Take 1 tablet (1 mg total) by mouth 2 (two) times daily. Patient taking differently: Take 1 mg by mouth 2 (two) times daily. For tardive dyskinesia 02/26/20   Salley Slaughter, NP  cetirizine (ZYRTEC ALLERGY) 10 MG tablet Take 1 tablet (10 mg total) by mouth daily. 02/01/20   [name], [name], PA-C  divalproex (DEPAKOTE) 250 MG DR tablet Take 250 mg by mouth 2 (two) times daily.    [provider]  famotidine (PEPCID) 20 MG tablet Take 1 tablet (20 mg total) by mouth 2 (two) times daily. 07/08/20   Alcus Dad, MD  fluPHENAZine decanoate (PROLIXIN) 25 MG/ML injection Inject 1 mL (25 mg total) into the muscle every 14 (fourteen) days. Last dose 07/08/2020. Next dose due 07/22/2020. Patient taking differently: Inject 25 mg into the muscle every 14 (fourteen) days. 07/08/20   Alcus Dad, MD  folic acid (FOLVITE) 1 MG tablet Take 1 tablet (1 mg total) by mouth daily. 05/09/20   Mullis, Kiersten P, DO  hydrOXYzine (ATARAX/VISTARIL) 25 MG tablet Take 1 tablet (25 mg total) by mouth at bedtime as needed for anxiety (Anxiety, Insomnia). Patient taking differently: Take 25 mg by mouth at bedtime as needed for anxiety or itching (anxiousness). 06/26/20   Wilber Oliphant, MD  Multiple Vitamin (MULTIVITAMIN WITH MINERALS) TABS tablet Take 1 tablet by mouth daily. 06/28/14   Bacigalupo, Dionne Bucy, MD  ramelteon (ROZEREM) 8 MG tablet Take 1 tablet (8 mg total) by mouth at bedtime. Patient not taking: Reported on 01/11/2021 06/26/20   Wilber Oliphant, MD  Suvorexant (BELSOMRA) 10 MG TABS Take 10 mg by mouth at bedtime.    [provider]  tamsulosin (FLOMAX) 0.4 MG CAPS capsule TAKE 2 CAPSULE BY MOUTH DAILY Patient taking differently: Take 0.8 mg  by mouth every morning. 02/23/20   Mullis, Kiersten P, DO  thiamine 100 MG tablet Take 1 tablet (100 mg total) by mouth daily. 05/09/20   Mullis, Kiersten P, DO  traZODone (DESYREL) 50 MG tablet Take 1 tablet (50 mg total) by mouth at bedtime. 06/26/20   Wilber Oliphant, MD  vitamin C (ASCORBIC ACID) 500 MG tablet Take 500 mg by mouth daily.     [provider]  loratadine (CLARITIN) 10 MG tablet Take 1 tablet (10 mg total) by mouth daily. 12/28/19 02/05/20  [name], DO    ___________________________________________________________________________________________________ Physical Exam:    06/13/2022    9:30 PM 06/13/2022    8:37 PM 06/13/2022    8:36 PM  Vitals with BMI  Height  '5\' 9"'$    Weight  135 lbs  BMI  81.19   Systolic 147  829  Diastolic 84  92  Pulse 562  118     1. General:  in No ***Acute distress***increased work of breathing ***complaining of severe pain****agitated * Chronically ill *well *cachectic *toxic acutely ill -appearing 2. Psychological: Alert and *** Oriented 3. Head/ENT:   Moist *** Dry Mucous Membranes                          Head Non traumatic, neck supple                          Normal *** Poor Dentition 4. SKIN: normal *** decreased Skin turgor,  Skin clean Dry and intact no rash 5. Heart: Regular rate and rhythm no*** Murmur, no Rub or gallop 6. Lungs: ***Clear to auscultation bilaterally, no wheezes or crackles   7. Abdomen: Soft, ***non-tender, Non distended *** obese ***bowel sounds present 8. Lower extremities: no clubbing, cyanosis, no ***edema 9. Neurologically Grossly intact, moving all 4 extremities equally *** strength 5 out of 5 in all 4 extremities cranial nerves II through XII intact 10. MSK: Normal range of motion    Chart has been reviewed  ______________________________________________________________________________________________  Assessment/Plan  ***  Admitted for *** There are no diagnoses linked to this  encounter.   Present on Admission: **None**     No problem-specific Assessment & Plan notes found for this encounter.    Other plan as per orders.  DVT prophylaxis:  SCD *** Lovenox       Code Status:    Code Status: Prior FULL CODE *** DNR/DNI ***comfort care as per patient ***family  I had personally discussed CODE STATUS with patient and family* I had spent *min discussing goals of care and CODE STATUS    Family Communication:   Family not at  Bedside  plan of care was discussed on the phone with *** Son, Daughter, Wife, Husband, Verdie Drown , father, mother [phone] Disposition Plan:   *** likely will need placement for rehabilitation                          Back to current facility when stable                            To home once workup is complete and patient is stable  ***Following barriers for discharge:                            Electrolytes corrected                               Anemia corrected                             Pain controlled with PO medications                               Afebrile, white count improving able to transition to PO antibiotics                             Will need to be able  to tolerate PO                            Will likely need home health, home O2, set up                           Will need consultants to evaluate patient prior to discharge  ****EXPECT DC tomorrow                    ***Would benefit from PT/OT eval prior to DC  Ordered                   Swallow eval - SLP ordered                   Diabetes care coordinator                   Transition of care consulted                   Nutrition    consulted                  Wound care  consulted                   Palliative care    consulted                   Behavioral health  consulted                    Consults called: ***    Admission status:  ED Disposition     None        Obs***  ***  inpatient     I Expect 2 midnight stay secondary to  severity of patient's current illness need for inpatient interventions justified by the following: ***hemodynamic instability despite optimal treatment (tachycardia *hypotension * tachypnea *hypoxia, hypercapnia) * Severe lab/radiological/exam abnormalities including:     and extensive comorbidities including: *substance abuse  *Chronic pain *DM2  * CHF * CAD  * COPD/asthma *Morbid Obesity * CKD *dementia *liver disease *history of stroke with residual deficits *  malignancy, * sickle cell disease  History of amputation Chronic anticoagulation  That are currently affecting medical management.   I expect  patient to be hospitalized for 2 midnights requiring inpatient medical care.  Patient is at high risk for adverse outcome (such as loss of life or disability) if not treated.  Indication for inpatient stay as follows:  Severe change from baseline regarding mental status Hemodynamic instability despite maximal medical therapy,  ongoing suicidal ideations,  severe pain requiring acute inpatient management,  inability to maintain oral hydration   persistent chest pain despite medical management Need for operative/procedural  intervention New or worsening hypoxia   Need for IV antibiotics, IV fluids, IV rate controling medications, IV antihypertensives, IV pain medications, IV anticoagulation, need for biPAP    Level of care   *** tele  For 12H 24H     medical floor       progressive tele indefinitely please discontinue once patient no longer qualifies COVID-19 Labs    Lab Results  Component Value Date   [place] NEGATIVE 01/14/2021     Precautions: admitted as *** Covid Negative  ***asymptomatic screening protocol****PUI *** covid positive No active isolations ***If Covid PCR is negative  - please DC precautions -  would need additional investigation given very high risk for false native test result    Critical***  Patient is critically ill due to  hemodynamic  instability * respiratory failure *severe sepsis* ongoing chest pain*  They are at high risk for life/limb threatening clinical deterioration requiring frequent reassessment and modifications of care.  Services provided include examination of the patient, review of relevant ancillary tests, prescription of lifesaving therapies, review of medications and prophylactic therapy.  Total critical care time excluding separately billable procedures: 60*  Minutes.    Jerzy Roepke 06/13/2022, 10:55 PM ***  [facility]     after 2 AM please page floor coverage PA If 7AM-7PM, please contact the day team taking care of the patient using [email or web address]   Patient was evaluated in the context of the global COVID-19 pandemic, which necessitated consideration that the patient might be at risk for infection with the SARS-CoV-2 virus that causes COVID-19. Institutional protocols and algorithms that pertain to the evaluation of patients at risk for COVID-19 are in a state of rapid change based on information released by regulatory bodies including the CDC and federal and state organizations. These policies and algorithms were followed during the patient's care.

## 2022-06-13 NOTE — Subjective & Objective (Signed)
From SNF with elevated sodium Not eating not drinking

## 2022-06-13 NOTE — ED Provider Notes (Signed)
[name] Provider Note   CSN: [id number] Arrival date & time: 06/13/22  2014     History  Chief Complaint  Patient presents with   abnormal labs    [name] is a 71 y.o. male history of CKD, dementia, UTI here presenting with hypernatremia.  Patient is contracted and from a nursing home.  Patient had labs drawn and nursing home and apparently his sodium was high.  Patient unable to give me any history.  Facility did not print out the labs.  Patient's baseline sodium is around 140.   The history is provided by the patient.       Home Medications Prior to Admission medications   Medication Sig Start Date End Date Taking? Authorizing Provider  acetaminophen (TYLENOL) 325 MG tablet Take 2 tablets (650 mg total) by mouth every 6 (six) hours as needed for mild pain, moderate pain or headache. 05/09/20   Mullis, Kiersten P, DO  amLODipine (NORVASC) 10 MG tablet Take 1 tablet (10 mg total) by mouth daily. 07/08/20   Alcus Dad, MD  asenapine (SAPHRIS) 5 MG SUBL 24 hr tablet Place 1 tablet (5 mg total) under the tongue 2 (two) times daily as needed (agitation/aggression). 01/14/21   Horton, Alvin Critchley, DO  benztropine (COGENTIN) 1 MG tablet Take 1 tablet (1 mg total) by mouth 2 (two) times daily. Patient taking differently: Take 1 mg by mouth 2 (two) times daily. For tardive dyskinesia 02/26/20   Salley Slaughter, NP  cetirizine (ZYRTEC ALLERGY) 10 MG tablet Take 1 tablet (10 mg total) by mouth daily. 02/01/20   [name], [name], PA-C  divalproex (DEPAKOTE) 250 MG DR tablet Take 250 mg by mouth 2 (two) times daily.    [provider]  famotidine (PEPCID) 20 MG tablet Take 1 tablet (20 mg total) by mouth 2 (two) times daily. 07/08/20   Alcus Dad, MD  fluPHENAZine decanoate (PROLIXIN) 25 MG/ML injection Inject 1 mL (25 mg total) into the muscle every 14 (fourteen) days. Last dose 07/08/2020. Next dose due 07/22/2020. Patient taking  differently: Inject 25 mg into the muscle every 14 (fourteen) days. 07/08/20   Alcus Dad, MD  folic acid (FOLVITE) 1 MG tablet Take 1 tablet (1 mg total) by mouth daily. 05/09/20   Mullis, Kiersten P, DO  hydrOXYzine (ATARAX/VISTARIL) 25 MG tablet Take 1 tablet (25 mg total) by mouth at bedtime as needed for anxiety (Anxiety, Insomnia). Patient taking differently: Take 25 mg by mouth at bedtime as needed for anxiety or itching (anxiousness). 06/26/20   Wilber Oliphant, MD  Multiple Vitamin (MULTIVITAMIN WITH MINERALS) TABS tablet Take 1 tablet by mouth daily. 06/28/14   Bacigalupo, Dionne Bucy, MD  ramelteon (ROZEREM) 8 MG tablet Take 1 tablet (8 mg total) by mouth at bedtime. Patient not taking: Reported on 01/11/2021 06/26/20   Wilber Oliphant, MD  Suvorexant (BELSOMRA) 10 MG TABS Take 10 mg by mouth at bedtime.    [provider]  tamsulosin (FLOMAX) 0.4 MG CAPS capsule TAKE 2 CAPSULE BY MOUTH DAILY Patient taking differently: Take 0.8 mg by mouth every morning. 02/23/20   Mullis, Kiersten P, DO  thiamine 100 MG tablet Take 1 tablet (100 mg total) by mouth daily. 05/09/20   Mullis, Kiersten P, DO  traZODone (DESYREL) 50 MG tablet Take 1 tablet (50 mg total) by mouth at bedtime. 06/26/20   Wilber Oliphant, MD  vitamin C (ASCORBIC ACID) 500 MG tablet Take 500 mg by mouth  daily.     [provider]  loratadine (CLARITIN) 10 MG tablet Take 1 tablet (10 mg total) by mouth daily. 12/28/19 02/05/20  [name], DO      Allergies    Lisinopril, Penicillins, Amoxicillin, and Ibuprofen    Review of Systems   Review of Systems  Psychiatric/Behavioral:  Positive for confusion.   All other systems reviewed and are negative.   Physical Exam Updated Vital Signs BP 128/84   Pulse (!) 102   Temp 97.6 F (36.4 C) (Oral)   Resp 16   Ht '5\' 9"'$  (1.753 m)   Wt 61.2 kg   SpO2 97%   BMI 19.94 kg/m  Physical Exam Vitals and nursing note reviewed.  Constitutional:      Comments: Confused and  contracted and nonverbal  HENT:     Nose: Nose normal.     Mouth/Throat:     Mouth: Mucous membranes are dry.  Eyes:     Extraocular Movements: Extraocular movements intact.     Pupils: Pupils are equal, round, and reactive to light.  Cardiovascular:     Rate and Rhythm: Normal rate and regular rhythm.     Pulses: Normal pulses.     Heart sounds: Normal heart sounds.  Pulmonary:     Effort: Pulmonary effort is normal.     Breath sounds: Normal breath sounds.  Abdominal:     General: Abdomen is flat.     Palpations: Abdomen is soft.  Musculoskeletal:        General: Normal range of motion.     Cervical back: Normal range of motion and neck supple.  Skin:    General: Skin is warm.     Capillary Refill: Capillary refill takes less than 2 seconds.  Neurological:     Comments: Contracted and nonverbal  Psychiatric:     Comments: Unable      ED Results / Procedures / Treatments   Labs (all labs ordered are listed, but only abnormal results are displayed) Labs Reviewed  CBC WITH DIFFERENTIAL/PLATELET - Abnormal; Notable for the following components:      Result Value   RBC 4.13 (*)    Hemoglobin 11.8 (*)    Platelets 132 (*)    All other components within normal limits  COMPREHENSIVE METABOLIC PANEL - Abnormal; Notable for the following components:   Sodium 162 (*)    Chloride 130 (*)    Glucose, Bld 119 (*)    BUN 48 (*)    Creatinine, Ser 1.71 (*)    Calcium 8.7 (*)    Albumin 2.6 (*)    AST 53 (*)    ALT 49 (*)    GFR, Estimated 43 (*)    All other components within normal limits  URINALYSIS, ROUTINE W REFLEX MICROSCOPIC    EKG EKG Interpretation  Date/Time:  Saturday June 13 2022 21:01:05 EDT Ventricular Rate:  119 PR Interval:  113 QRS Duration: 87 QT Interval:  307 QTC Calculation: 432 R Axis:   72 Text Interpretation: Sinus or ectopic atrial tachycardia Nonspecific T abnormalities, inferior leads Baseline wander in lead(s) V1 Since last tracing  rate faster Confirmed by Wandra Arthurs [phone]) on 06/13/2022 9:09:20 PM  Radiology No results found.  Procedures Procedures    CRITICAL CARE Performed by: Wandra Arthurs   Total critical care time: 30 minutes  Critical care time was exclusive of separately billable procedures and treating other patients.  Critical care was necessary to treat or prevent imminent  or life-threatening deterioration.  Critical care was time spent personally by me on the following activities: development of treatment plan with patient and/or surrogate as well as nursing, discussions with consultants, evaluation of patient's response to treatment, examination of patient, obtaining history from patient or surrogate, ordering and performing treatments and interventions, ordering and review of laboratory studies, ordering and review of radiographic studies, pulse oximetry and re-evaluation of patient's condition.   Medications Ordered in ED Medications  0.9 %  sodium chloride infusion ( Intravenous New Bag/Given 06/13/22 2243)  sodium chloride 0.9 % bolus 1,000 mL (0 mLs Intravenous Stopped 06/13/22 2257)    ED Course/ Medical Decision Making/ A&P                           Medical Decision Making MAXIMOS ZAYAS is a 71 y.o. male here presenting with hypernatremia.  There is no labs in the nursing home transfer paperwork.  We will check CBC and CMP and patient appears hypovolemic so we will give IV fluids.   11:03 PM I reviewed patient's labs.  Patient's sodium level is 162.  He also has AKI with creatinine of 1.7.  Patient given 1 L NS bolus and I started patient on NS infusion 150 cc/hr   Problems Addressed: AKI (acute kidney injury) ([place]): acute illness or injury Hypernatremia: acute illness or injury  Amount and/or Complexity of Data Reviewed Labs: ordered. Decision-making details documented in ED Course. ECG/medicine tests: ordered.  Risk Prescription drug management.    Final Clinical  Impression(s) / ED Diagnoses Final diagnoses:  None    Rx / DC Orders ED Discharge Orders     None         Drenda Freeze, MD 06/13/22 2305

## 2022-06-13 NOTE — H&P (Incomplete)
[name] CBS:[id number] DOB: 1951-07-30 DOA: 06/13/2022     PCP: Jacelyn Grip, MD   Outpatient Specialists: * NONE CARDS: * Dr. NEphrology: *  Dr. NEurology *   Dr. Pulmonary *  Dr.  Oncology * Dr. Fabienne Bruns* Dr.  Sadie Haber, [facility]) No care team member to display Urology Dr. *  Patient arrived to ER on 06/13/22 at 2014 Referred by Attending Drenda Freeze, MD   Patient coming from:     From facility [facility]  Chief Complaint:   Chief Complaint  Patient presents with  . abnormal labs    HPI: [name] is a 71 y.o. male with medical history significant of schizophrenia, dementia, chronic kidney disease stage IIIa, recent history of ESBL UTI , a.fib, dementia,   Presented with   hypernatremia  From SNF with elevated sodium Not eating not drinking   Recenly ws treated for UTI with Cefepime Regarding pertinent Chronic problems:       HTN on NOrvasc   chronic CHF diastolic/systolic/ combined - last echoestimation, is 60 to 65%.  Done in 2021  Hx of a.fib not on anticoagulation    CKD stage IIIb- baseline Cr 1.4 Estimated Creatinine Clearance: 34.8 mL/min (A) (by C-G formula based on SCr of 1.71 mg/dL (H)).  Lab Results  Component Value Date   CREATININE 1.71 (H) 06/13/2022   CREATININE 1.42 (H) 01/11/2021   CREATININE 1.30 (H) 11/30/2020     Dementia - on Aricept** [place]    While in ER:   Noted to be hypernatremic up to 165 UA persistent UTI   Ordered  CT HEAD *** NON acute  CXR - ***NON acute  CTabd/pelvis - ***nonacute  CTA chest - ***nonacute, no PE, * no evidence of infiltrate  Following Medications were ordered in ER: Medications  0.9 %  sodium chloride infusion ( Intravenous New Bag/Given 06/13/22 2243)  sodium chloride 0.9 % bolus 1,000 mL (1,000 mLs Intravenous New Bag/Given 06/13/22 2118)    _____    ED Triage Vitals  Enc Vitals Group     BP 06/13/22 2036 (!) 113/92     Pulse Rate 06/13/22 2036 (!) 118     Resp 06/13/22  2036 18     Temp 06/13/22 2036 97.6 F (36.4 C)     Temp Source 06/13/22 2036 Oral     SpO2 06/13/22 2036 98 %     Weight 06/13/22 2037 135 lb (61.2 kg)     Height 06/13/22 2037 '5\' 9"'$  (1.753 m)     Head Circumference --      Peak Flow --      Pain Score 06/13/22 2037 0     Pain Loc --      Pain Edu? --      Excl. in [place]? --   TMAX(24)@     _________________________________________ Significant initial  Findings: Abnormal Labs Reviewed  COMPREHENSIVE METABOLIC PANEL - Abnormal; Notable for the following components:      Result Value   Sodium 162 (*)    Chloride 130 (*)    Glucose, Bld 119 (*)    BUN 48 (*)    Creatinine, Ser 1.71 (*)    Calcium 8.7 (*)    Albumin 2.6 (*)    AST 53 (*)    ALT 49 (*)    GFR, Estimated 43 (*)    All other components within normal limits     _________________________ Troponin ***ordered ECG: Ordered Personally reviewed and interpreted by  me showing: HR : 119 Rhythm:Sinus or ectopic atrial tachycardia Nonspecific T abnormalities, inferior leads Baseline wander in lead(s) V1 QTC 432   _______   The recent clinical data is shown below. Vitals:   06/13/22 2036 06/13/22 2037 06/13/22 2130  BP: (!) 113/92  128/84  Pulse: (!) 118  (!) 102  Resp: 18  16  Temp: 97.6 F (36.4 C)    TempSrc: Oral    SpO2: 98%  97%  Weight:  61.2 kg   Height:  '5\' 9"'$  (1.753 m)       WBC     Component Value Date/Time   WBC 4.4 01/11/2021 1559   LYMPHSABS 1.9 01/11/2021 1559   MONOABS 0.4 01/11/2021 1559   EOSABS 0.1 01/11/2021 1559   BASOSABS 0.0 01/11/2021 1559      Procalcitonin *** Ordered Lactic Acid, Venous    Component Value Date/Time   LATICACIDVEN 2.2 (HH) 11/09/2020 1311     Procalcitonin *** Ordered   UA *** no evidence of UTI  ***Pending ***not ordered   Urine analysis:    Component Value Date/Time   COLORURINE YELLOW 01/11/2021 [place] 01/11/2021 1537   LABSPEC 1.012 01/11/2021 1537   PHURINE 8.0  01/11/2021 1537   GLUCOSEU NEGATIVE 01/11/2021 1537   HGBUR NEGATIVE 01/11/2021 1537   HGBUR negative 08/27/2010 0854   BILIRUBINUR NEGATIVE 01/11/2021 1537   BILIRUBINUR negative 08/02/2018 1555   BILIRUBINUR NEG 07/27/2014 1518   KETONESUR NEGATIVE 01/11/2021 1537   PROTEINUR NEGATIVE 01/11/2021 1537   UROBILINOGEN 0.2 08/02/2018 1555   UROBILINOGEN 0.2 08/27/2010 0854   NITRITE NEGATIVE 01/11/2021 1537   LEUKOCYTESUR NEGATIVE 01/11/2021 1537    Results for orders placed or performed during the hospital encounter of 01/11/21  Resp Panel by RT-PCR (Flu A&B, Covid) Nasopharyngeal Swab     Status: None   Collection Time: 01/11/21  3:59 PM   Specimen: Nasopharyngeal Swab; Nasopharyngeal(NP) swabs in vial transport medium  Result Value Ref Range Status   SARS Coronavirus 2 by RT PCR NEGATIVE NEGATIVE Final    Comment: (NOTE) SARS-CoV-2 target nucleic acids are NOT DETECTED.  The SARS-CoV-2 RNA is generally detectable in upper respiratory specimens during the acute phase of infection. The lowest concentration of SARS-CoV-2 viral copies this assay can detect is 138 copies/mL. A negative result does not preclude SARS-Cov-2 infection and should not be used as the sole basis for treatment or other patient management decisions. A negative result may occur with  improper specimen collection/handling, submission of specimen other than nasopharyngeal swab, presence of viral mutation(s) within the areas targeted by this assay, and inadequate number of viral copies(<138 copies/mL). A negative result must be combined with clinical observations, patient history, and epidemiological information. The expected result is Negative.  Fact Sheet for Patients:  EntrepreneurPulse.com.au  Fact Sheet for Healthcare Providers:  IncredibleEmployment.be  This test is no t yet approved or cleared by the [place] FDA and  has been authorized for detection and/or  diagnosis of SARS-CoV-2 by FDA under an Emergency Use Authorization (EUA). This EUA will remain  in effect (meaning this test can be used) for the duration of the COVID-19 declaration under Section 564(b)(1) of the Act, 21 U.S.C.section 360bbb-3(b)(1), unless the authorization is terminated  or revoked sooner.       Influenza A by PCR NEGATIVE NEGATIVE Final   Influenza B by PCR NEGATIVE NEGATIVE Final    Comment: (NOTE) The Xpert Xpress SARS-CoV-2/FLU/RSV plus assay is intended as an aid  in the diagnosis of influenza from Nasopharyngeal swab specimens and should not be used as a sole basis for treatment. Nasal washings and aspirates are unacceptable for Xpert Xpress SARS-CoV-2/FLU/RSV testing.  Fact Sheet for Patients: EntrepreneurPulse.com.au  Fact Sheet for Healthcare Providers: IncredibleEmployment.be  This test is not yet approved or cleared by the [place] FDA and has been authorized for detection and/or diagnosis of SARS-CoV-2 by FDA under an Emergency Use Authorization (EUA). This EUA will remain in effect (meaning this test can be used) for the duration of the COVID-19 declaration under Section 564(b)(1) of the Act, 21 U.S.C. section 360bbb-3(b)(1), unless the authorization is terminated or revoked.  Performed at [facility], [place] [address] [place], [place] [address]   Resp Panel by RT-PCR (Flu A&B, Covid) Nasopharyngeal Swab     Status: None   Collection Time: 01/14/21  2:57 PM   Specimen: Nasopharyngeal Swab; Nasopharyngeal(NP) swabs in vial transport medium  Result Value Ref Range Status   SARS Coronavirus 2 by RT PCR NEGATIVE NEGATIVE Final    Comment: (NOTE) SARS-CoV-2 target nucleic acids are NOT DETECTED.  The SARS-CoV-2 RNA is generally detectable in upper respiratory specimens during the acute phase of infection. The lowest concentration of SARS-CoV-2 viral copies this assay can detect is 138  copies/mL. A negative result does not preclude SARS-Cov-2 infection and should not be used as the sole basis for treatment or other patient management decisions. A negative result may occur with  improper specimen collection/handling, submission of specimen other than nasopharyngeal swab, presence of viral mutation(s) within the areas targeted by this assay, and inadequate number of viral copies(<138 copies/mL). A negative result must be combined with clinical observations, patient history, and epidemiological information. The expected result is Negative.  Fact Sheet for Patients:  EntrepreneurPulse.com.au  Fact Sheet for Healthcare Providers:  IncredibleEmployment.be  This test is no t yet approved or cleared by the [place] FDA and  has been authorized for detection and/or diagnosis of SARS-CoV-2 by FDA under an Emergency Use Authorization (EUA). This EUA will remain  in effect (meaning this test can be used) for the duration of the COVID-19 declaration under Section 564(b)(1) of the Act, 21 U.S.C.section 360bbb-3(b)(1), unless the authorization is terminated  or revoked sooner.       Influenza A by PCR NEGATIVE NEGATIVE Final   Influenza B by PCR NEGATIVE NEGATIVE Final    Comment: (NOTE) The Xpert Xpress SARS-CoV-2/FLU/RSV plus assay is intended as an aid in the diagnosis of influenza from Nasopharyngeal swab specimens and should not be used as a sole basis for treatment. Nasal washings and aspirates are unacceptable for Xpert Xpress SARS-CoV-2/FLU/RSV testing.  Fact Sheet for Patients: EntrepreneurPulse.com.au  Fact Sheet for Healthcare Providers: IncredibleEmployment.be  This test is not yet approved or cleared by the [place] FDA and has been authorized for detection and/or diagnosis of SARS-CoV-2 by FDA under an Emergency Use Authorization (EUA). This EUA will remain in effect (meaning  this test can be used) for the duration of the COVID-19 declaration under Section 564(b)(1) of the Act, 21 U.S.C. section 360bbb-3(b)(1), unless the authorization is terminated or revoked.  Performed at [facility], [place] [address], [place], [place] [address]      _______________________________________________ Hospitalist was called for admission for *** There are no diagnoses linked to this encounter.   The following Work up has been ordered so far:  Orders Placed This Encounter  Procedures  . CBC with Differential  . Comprehensive  metabolic panel  . Urinalysis, Routine w reflex microscopic  . Consult to hospitalist  . EKG 12-Lead  . Saline lock IV     OTHER Significant initial  Findings:  labs showing:    Recent Labs  Lab 06/13/22 2105  NA 162*  K 3.8  CO2 27  GLUCOSE 119*  BUN 48*  CREATININE 1.71*  CALCIUM 8.7*    Cr  * stable,  Up from baseline see below Lab Results  Component Value Date   CREATININE 1.71 (H) 06/13/2022   CREATININE 1.42 (H) 01/11/2021   CREATININE 1.30 (H) 11/30/2020    Recent Labs  Lab 06/13/22 2105  AST 53*  ALT 49*  ALKPHOS 87  BILITOT 0.7  PROT 7.1  ALBUMIN 2.6*   Lab Results  Component Value Date   CALCIUM 8.7 (L) 06/13/2022   PHOS 3.2 10/28/2020          Plt: Lab Results  Component Value Date   PLT 116 (L) 01/11/2021       COVID-19 Labs  No results for input(s): "DDIMER", "FERRITIN", "LDH", "CRP" in the last 72 hours.  Lab Results  Component Value Date   SARSCOV2NAA NEGATIVE 01/14/2021   [place] NEGATIVE 01/11/2021   [place] NEGATIVE 12/02/2020   [place] NEGATIVE 11/27/2020     Arterial ***Venous  Blood Gas result:  pH *** pCO2 ***; pO2 ***;     %O2 Sat ***.  ABG    Component Value Date/Time   PHART 7.377 01/21/2020 0704   PCO2ART 41.2 01/21/2020 0704   PO2ART 93 01/21/2020 0704   HCO3 28.0 04/24/2020 0026   TCO2 29 04/24/2020 0026   ACIDBASEDEF 1.0  01/21/2020 0704   O2SAT 99.0 04/24/2020 0026        No results for input(s): "WBC", "NEUTROABS", "HGB", "HCT", "MCV", "PLT" in the last 168 hours.  HG/HCT * stable,  Down *Up from baseline see below    Component Value Date/Time   HGB 12.1 (L) 01/11/2021 1559   HGB 14.4 04/13/2018 1057   HCT 37.2 (L) 01/11/2021 1559   HCT 41.6 04/13/2018 1057   MCV 90.1 01/11/2021 1559   MCV 88 04/13/2018 1057      No results for input(s): "LIPASE", "AMYLASE" in the last 168 hours. No results for input(s): "AMMONIA" in the last 168 hours.    Cardiac Panel (last 3 results) No results for input(s): "CKTOTAL", "CKMB", "TROPONINI", "RELINDX" in the last 72 hours.  .car BNP (last 3 results) No results for input(s): "BNP" in the last 8760 hours.    DM  labs:  HbA1C: No results for input(s): "HGBA1C" in the last 8760 hours.     CBG (last 3)  No results for input(s): "GLUCAP" in the last 72 hours.        Cultures:    Component Value Date/Time   SDES  11/27/2020 2350    BLOOD BLOOD RIGHT FOREARM Performed at [facility], [place] [address] [place], [place] [address]    SDES  11/27/2020 2350    BLOOD BLOOD RIGHT HAND Performed at [facility], [place] [address], [place], [place] [address]    [place]  11/27/2020 2350    BOTTLES DRAWN AEROBIC AND ANAEROBIC Blood Culture adequate volume Performed at [facility] [address], [place], [place] [address]    SPECREQUEST  11/27/2020 2350    BOTTLES DRAWN AEROBIC AND ANAEROBIC Blood Culture results may not be optimal due to an inadequate volume of blood received in  culture bottles Performed at [place] [address] [place], [place] [address]    CULT  11/27/2020 2350    NO GROWTH 5 DAYS Performed at [facility], [place] [address] [place], [place] [address]    CULT  11/27/2020 2350    NO GROWTH 5 DAYS Performed at [facility], [place]  [address] [place],  [address]    REPTSTATUS 12/03/2020 FINAL 11/27/2020 2350   REPTSTATUS 12/03/2020 FINAL 11/27/2020 2350     Radiological Exams on Admission: No results found. _______________________________________________________________________________________________________ Latest  Blood pressure 128/84, pulse (!) 102, temperature 97.6 F (36.4 C), temperature source Oral, resp. rate 16, height '5\' 9"'$  (1.753 m), weight 61.2 kg, SpO2 97 %.   Vitals  labs and radiology finding personally reviewed  Review of Systems:    Pertinent positives include: ***  Constitutional:  No weight loss, night sweats, Fevers, chills, fatigue, weight loss  HEENT:  No headaches, Difficulty swallowing,Tooth/dental problems,Sore throat,  No sneezing, itching, ear ache, nasal congestion, post nasal drip,  Cardio-vascular:  No chest pain, Orthopnea, PND, anasarca, dizziness, palpitations.no Bilateral lower extremity swelling  GI:  No heartburn, indigestion, abdominal pain, nausea, vomiting, diarrhea, change in bowel habits, loss of appetite, melena, blood in stool, hematemesis Resp:  no shortness of breath at rest. No dyspnea on exertion, No excess mucus, no productive cough, No non-productive cough, No coughing up of blood.No change in color of mucus.No wheezing. Skin:  no rash or lesions. No jaundice GU:  no dysuria, change in color of urine, no urgency or frequency. No straining to urinate.  No flank pain.  Musculoskeletal:  No joint pain or no joint swelling. No decreased range of motion. No back pain.  Psych:  No change in mood or affect. No depression or anxiety. No memory loss.  Neuro: no localizing neurological complaints, no tingling, no weakness, no double vision, no gait abnormality, no slurred speech, no confusion  All systems reviewed and apart from [place] all are negative _______________________________________________________________________________________________ Past Medical  History:   Past Medical History:  Diagnosis Date  . Acute encephalopathy 04/23/2020  . Acute metabolic encephalopathy 11/21/6544  . AKI (acute kidney injury) ([place]) 05/12/2020  . BPH (benign prostatic hyperplasia)   . Closed displaced fracture of fifth metatarsal bone of right foot   . Colon polyp 2010  . Crush injury   . Dementia ([place])   . Depression   . GERD (gastroesophageal reflux disease)   . Hepatitis C    "caught it when I had a blood transfusion"  . High cholesterol   . History of blood transfusion    "when I was young"  . Hypertension   . Paranoid schizophrenia ([place])   . PARANOID SCHIZOPHRENIA, CHRONIC 01/17/2009  . Prostate atrophy   . Retinal vein occlusion       Past Surgical History:  Procedure Laterality Date  . CIRCUMCISION  1959  . ORIF TOE FRACTURE Right 04/30/2020   Procedure: OPEN REDUCTION INTERNAL FIXATION (ORIF) RIGHT FOOT METATARSAL FRACTURES;  Surgeon: Newt Minion, MD;  Location: [facility];  Service: Orthopedics;  Laterality: Right;  . TONSILLECTOMY      Social History:  Ambulatory *** independently cane, walker  wheelchair bound, bed bound     reports that he has been smoking cigarettes. He has a 24.00 pack-year smoking history. He has never used smokeless tobacco. He reports current alcohol use. He reports that he does not use drugs.     Family History: ***  Family History  Problem Relation Age of Onset  . Hypertension Mother   . Diabetes Mother   . Stroke Mother    ______________________________________________________________________________________________ Allergies: Allergies  Allergen Reactions  . Lisinopril Cough  . Penicillins Hives  . Amoxicillin Rash  . Ibuprofen Rash     Prior to Admission medications   Medication Sig Start Date End Date Taking? Authorizing Provider  acetaminophen (TYLENOL) 325 MG tablet Take 2 tablets (650 mg total) by mouth every 6 (six) hours as needed for mild pain, moderate pain or headache. 05/09/20    Mullis, Kiersten P, DO  amLODipine (NORVASC) 10 MG tablet Take 1 tablet (10 mg total) by mouth daily. 07/08/20   Alcus Dad, MD  asenapine (SAPHRIS) 5 MG SUBL 24 hr tablet Place 1 tablet (5 mg total) under the tongue 2 (two) times daily as needed (agitation/aggression). 01/14/21   [place], Alvin Critchley, DO  benztropine (COGENTIN) 1 MG tablet Take 1 tablet (1 mg total) by mouth 2 (two) times daily. Patient taking differently: Take 1 mg by mouth 2 (two) times daily. For tardive dyskinesia 02/26/20   Salley Slaughter, NP  cetirizine (ZYRTEC ALLERGY) 10 MG tablet Take 1 tablet (10 mg total) by mouth daily. 02/01/20   [name], [name], PA-C  divalproex (DEPAKOTE) 250 MG DR tablet Take 250 mg by mouth 2 (two) times daily.    [provider]  famotidine (PEPCID) 20 MG tablet Take 1 tablet (20 mg total) by mouth 2 (two) times daily. 07/08/20   Alcus Dad, MD  fluPHENAZine decanoate (PROLIXIN) 25 MG/ML injection Inject 1 mL (25 mg total) into the muscle every 14 (fourteen) days. Last dose 07/08/2020. Next dose due 07/22/2020. Patient taking differently: Inject 25 mg into the muscle every 14 (fourteen) days. 07/08/20   Alcus Dad, MD  folic acid (FOLVITE) 1 MG tablet Take 1 tablet (1 mg total) by mouth daily. 05/09/20   Mullis, Kiersten P, DO  hydrOXYzine (ATARAX/VISTARIL) 25 MG tablet Take 1 tablet (25 mg total) by mouth at bedtime as needed for anxiety (Anxiety, Insomnia). Patient taking differently: Take 25 mg by mouth at bedtime as needed for anxiety or itching (anxiousness). 06/26/20   Wilber Oliphant, MD  Multiple Vitamin (MULTIVITAMIN WITH MINERALS) TABS tablet Take 1 tablet by mouth daily. 06/28/14   Bacigalupo, Dionne Bucy, MD  ramelteon (ROZEREM) 8 MG tablet Take 1 tablet (8 mg total) by mouth at bedtime. Patient not taking: Reported on 01/11/2021 06/26/20   Wilber Oliphant, MD  Suvorexant (BELSOMRA) 10 MG TABS Take 10 mg by mouth at bedtime.    [provider]  tamsulosin (FLOMAX)  0.4 MG CAPS capsule TAKE 2 CAPSULE BY MOUTH DAILY Patient taking differently: Take 0.8 mg by mouth every morning. 02/23/20   Mullis, Kiersten P, DO  thiamine 100 MG tablet Take 1 tablet (100 mg total) by mouth daily. 05/09/20   Mullis, Kiersten P, DO  traZODone (DESYREL) 50 MG tablet Take 1 tablet (50 mg total) by mouth at bedtime. 06/26/20   Wilber Oliphant, MD  vitamin C (ASCORBIC ACID) 500 MG tablet Take 500 mg by mouth daily.     [provider]  loratadine (CLARITIN) 10 MG tablet Take 1 tablet (10 mg total) by mouth daily. 12/28/19 02/05/20  [name], DO    ___________________________________________________________________________________________________ Physical Exam:    06/13/2022    9:30 PM 06/13/2022    8:37 PM 06/13/2022    8:36 PM  Vitals with BMI  Height  '5\' 9"'$   Weight  135 lbs   BMI  33.29   Systolic 518  841  Diastolic 84  92  Pulse 660  118     1. General:  in No ***Acute distress***increased work of breathing ***complaining of severe pain****agitated * Chronically ill *well *cachectic *toxic acutely ill -appearing 2. Psychological: Alert and *** Oriented 3. Head/ENT:   Moist *** Dry Mucous Membranes                          Head Non traumatic, neck supple                          Normal *** Poor Dentition 4. SKIN: normal *** decreased Skin turgor,  Skin clean Dry and intact no rash 5. Heart: Regular rate and rhythm no*** Murmur, no Rub or gallop 6. Lungs: ***Clear to auscultation bilaterally, no wheezes or crackles   7. Abdomen: Soft, ***non-tender, Non distended *** obese ***bowel sounds present 8. Lower extremities: no clubbing, cyanosis, no ***edema 9. Neurologically Grossly intact, moving all 4 extremities equally *** strength 5 out of 5 in all 4 extremities cranial nerves II through XII intact 10. MSK: Normal range of motion    Chart has been  reviewed  ______________________________________________________________________________________________  Assessment/Plan  ***  Admitted for *** There are no diagnoses linked to this encounter.   Present on Admission: **None**     No problem-specific Assessment & Plan notes found for this encounter.    Other plan as per orders.  DVT prophylaxis:  SCD *** Lovenox       Code Status:    Code Status: Prior FULL CODE *** DNR/DNI ***comfort care as per patient ***family  I had personally discussed CODE STATUS with patient and family* I had spent *min discussing goals of care and CODE STATUS    Family Communication:   Family not at  Bedside  plan of care was discussed on the phone with *** Son, Daughter, Wife, Husband, Verdie Drown , father, mother [phone] Disposition Plan:   *** likely will need placement for rehabilitation                          Back to current facility when stable                            To home once workup is complete and patient is stable  ***Following barriers for discharge:                            Electrolytes corrected                               Anemia corrected                             Pain controlled with PO medications                               Afebrile, white count improving able to transition to PO antibiotics  Will need to be able to tolerate PO                            Will likely need home health, home O2, set up                           Will need consultants to evaluate patient prior to discharge  ****EXPECT DC tomorrow                    ***Would benefit from PT/OT eval prior to DC  Ordered                   Swallow eval - SLP ordered                   Diabetes care coordinator                   Transition of care consulted                   Nutrition    consulted                  Wound care  consulted                   Palliative care    consulted                   Behavioral  health  consulted                    Consults called: ***    Admission status:  ED Disposition     None        Obs***  ***  inpatient     I Expect 2 midnight stay secondary to severity of patient's current illness need for inpatient interventions justified by the following: ***hemodynamic instability despite optimal treatment (tachycardia *hypotension * tachypnea *hypoxia, hypercapnia) * Severe lab/radiological/exam abnormalities including:     and extensive comorbidities including: *substance abuse  *Chronic pain *DM2  * CHF * CAD  * COPD/asthma *Morbid Obesity * CKD *dementia *liver disease *history of stroke with residual deficits *  malignancy, * sickle cell disease  History of amputation Chronic anticoagulation  That are currently affecting medical management.   I expect  patient to be hospitalized for 2 midnights requiring inpatient medical care.  Patient is at high risk for adverse outcome (such as loss of life or disability) if not treated.  Indication for inpatient stay as follows:  Severe change from baseline regarding mental status Hemodynamic instability despite maximal medical therapy,  ongoing suicidal ideations,  severe pain requiring acute inpatient management,  inability to maintain oral hydration   persistent chest pain despite medical management Need for operative/procedural  intervention New or worsening hypoxia   Need for IV antibiotics, IV fluids, IV rate controling medications, IV antihypertensives, IV pain medications, IV anticoagulation, need for biPAP    Level of care   *** tele  For 12H 24H     medical floor       progressive tele indefinitely please discontinue once patient no longer qualifies COVID-19 Labs    Lab Results  Component Value Date   [place] NEGATIVE 01/14/2021     Precautions: admitted as *** Covid Negative  ***asymptomatic screening protocol****PUI *** covid positive No active isolations ***If Covid  PCR is  negative  - please DC precautions - would need additional investigation given very high risk for false native test result    Critical***  Patient is critically ill due to  hemodynamic instability * respiratory failure *severe sepsis* ongoing chest pain*  They are at high risk for life/limb threatening clinical deterioration requiring frequent reassessment and modifications of care.  Services provided include examination of the patient, review of relevant ancillary tests, prescription of lifesaving therapies, review of medications and prophylactic therapy.  Total critical care time excluding separately billable procedures: 60*  Minutes.    Shmuel Girgis 06/13/2022, 10:55 PM ***  [facility]     after 2 AM please page floor coverage PA If 7AM-7PM, please contact the day team taking care of the patient using [email or web address]   Patient was evaluated in the context of the global COVID-19 pandemic, which necessitated consideration that the patient might be at risk for infection with the SARS-CoV-2 virus that causes COVID-19. Institutional protocols and algorithms that pertain to the evaluation of patients at risk for COVID-19 are in a state of rapid change based on information released by regulatory bodies including the CDC and federal and state organizations. These policies and algorithms were followed during the patient's care.

## 2022-06-14 ENCOUNTER — Inpatient Hospital Stay (HOSPITAL_COMMUNITY): Payer: Medicare Other

## 2022-06-14 DIAGNOSIS — I480 Paroxysmal atrial fibrillation: Secondary | ICD-10-CM | POA: Diagnosis present

## 2022-06-14 DIAGNOSIS — R778 Other specified abnormalities of plasma proteins: Secondary | ICD-10-CM

## 2022-06-14 DIAGNOSIS — A419 Sepsis, unspecified organism: Secondary | ICD-10-CM | POA: Diagnosis not present

## 2022-06-14 DIAGNOSIS — Z7189 Other specified counseling: Secondary | ICD-10-CM

## 2022-06-14 DIAGNOSIS — E860 Dehydration: Secondary | ICD-10-CM

## 2022-06-14 DIAGNOSIS — K5900 Constipation, unspecified: Secondary | ICD-10-CM | POA: Diagnosis present

## 2022-06-14 DIAGNOSIS — R52 Pain, unspecified: Secondary | ICD-10-CM

## 2022-06-14 DIAGNOSIS — E870 Hyperosmolality and hypernatremia: Secondary | ICD-10-CM | POA: Diagnosis not present

## 2022-06-14 DIAGNOSIS — Z515 Encounter for palliative care: Secondary | ICD-10-CM | POA: Diagnosis not present

## 2022-06-14 DIAGNOSIS — R7989 Other specified abnormal findings of blood chemistry: Secondary | ICD-10-CM | POA: Diagnosis present

## 2022-06-14 DIAGNOSIS — N179 Acute kidney failure, unspecified: Secondary | ICD-10-CM | POA: Diagnosis not present

## 2022-06-14 DIAGNOSIS — L89153 Pressure ulcer of sacral region, stage 3: Secondary | ICD-10-CM

## 2022-06-14 DIAGNOSIS — N390 Urinary tract infection, site not specified: Secondary | ICD-10-CM | POA: Diagnosis not present

## 2022-06-14 DIAGNOSIS — L8990 Pressure ulcer of unspecified site, unspecified stage: Secondary | ICD-10-CM | POA: Insufficient documentation

## 2022-06-14 LAB — BASIC METABOLIC PANEL
Anion gap: 5 (ref 5–15)
Anion gap: 5 (ref 5–15)
Anion gap: 5 (ref 5–15)
BUN: 44 mg/dL — ABNORMAL HIGH (ref 8–23)
BUN: 42 mg/dL — ABNORMAL HIGH (ref 8–23)
BUN: 38 mg/dL — ABNORMAL HIGH (ref 8–23)
BUN: 40 mg/dL — ABNORMAL HIGH (ref 8–23)
BUN: 40 mg/dL — ABNORMAL HIGH (ref 8–23)
CO2: 24 mmol/L (ref 22–32)
CO2: 25 mmol/L (ref 22–32)
CO2: 25 mmol/L (ref 22–32)
CO2: 24 mmol/L (ref 22–32)
CO2: 26 mmol/L (ref 22–32)
Calcium: 8.2 mg/dL — ABNORMAL LOW (ref 8.9–10.3)
Calcium: 7.9 mg/dL — ABNORMAL LOW (ref 8.9–10.3)
Calcium: 7.5 mg/dL — ABNORMAL LOW (ref 8.9–10.3)
Calcium: 8.1 mg/dL — ABNORMAL LOW (ref 8.9–10.3)
Calcium: 8.3 mg/dL — ABNORMAL LOW (ref 8.9–10.3)
Chloride: >130 mmol/L (ref 98–111)
Chloride: >130 mmol/L (ref 98–111)
Chloride: 120 mmol/L — ABNORMAL HIGH (ref 98–111)
Chloride: 130 mmol/L — ABNORMAL HIGH (ref 98–111)
Chloride: 127 mmol/L — ABNORMAL HIGH (ref 98–111)
Creatinine, Ser: 1.45 mg/dL — ABNORMAL HIGH (ref 0.61–1.24)
Creatinine, Ser: 1.37 mg/dL — ABNORMAL HIGH (ref 0.61–1.24)
Creatinine, Ser: 1.21 mg/dL (ref 0.61–1.24)
Creatinine, Ser: 1.23 mg/dL (ref 0.61–1.24)
Creatinine, Ser: 1.16 mg/dL (ref 0.61–1.24)
GFR, Estimated: 52 mL/min — ABNORMAL LOW (ref >60)
GFR, Estimated: 55 mL/min — ABNORMAL LOW (ref >60)
GFR, Estimated: >60 mL/min (ref >60)
GFR, Estimated: >60 mL/min (ref >60)
GFR, Estimated: >60 mL/min (ref >60)
Glucose, Bld: 106 mg/dL — ABNORMAL HIGH (ref 70–99)
Glucose, Bld: 135 mg/dL — ABNORMAL HIGH (ref 70–99)
Glucose, Bld: 499 mg/dL — ABNORMAL HIGH (ref 70–99)
Glucose, Bld: 136 mg/dL — ABNORMAL HIGH (ref 70–99)
Glucose, Bld: 141 mg/dL — ABNORMAL HIGH (ref 70–99)
Potassium: 3.8 mmol/L (ref 3.5–5.1)
Potassium: 3.7 mmol/L (ref 3.5–5.1)
Potassium: 3.4 mmol/L — ABNORMAL LOW (ref 3.5–5.1)
Potassium: 4.9 mmol/L (ref 3.5–5.1)
Potassium: 3.4 mmol/L — ABNORMAL LOW (ref 3.5–5.1)
Sodium: 159 mmol/L — ABNORMAL HIGH (ref 135–145)
Sodium: 161 mmol/L (ref 135–145)
Sodium: 150 mmol/L — ABNORMAL HIGH (ref 135–145)
Sodium: 159 mmol/L — ABNORMAL HIGH (ref 135–145)
Sodium: 158 mmol/L — ABNORMAL HIGH (ref 135–145)

## 2022-06-14 LAB — CBC
HCT: 33.4 % — ABNORMAL LOW (ref 39.0–52.0)
Hemoglobin: 10.1 g/dL — ABNORMAL LOW (ref 13.0–17.0)
MCH: 28.9 pg (ref 26.0–34.0)
MCHC: 30.2 g/dL (ref 30.0–36.0)
MCV: 95.4 fL (ref 80.0–100.0)
Platelets: 120 10*3/uL — ABNORMAL LOW (ref 150–400)
RBC: 3.5 MIL/uL — ABNORMAL LOW (ref 4.22–5.81)
RDW: 14.7 % (ref 11.5–15.5)
WBC: 7.2 10*3/uL (ref 4.0–10.5)
nRBC: 0 % (ref 0.0–0.2)

## 2022-06-14 LAB — GLUCOSE, CAPILLARY
Glucose-Capillary: 87 mg/dL (ref 70–99)
Glucose-Capillary: 115 mg/dL — ABNORMAL HIGH (ref 70–99)
Glucose-Capillary: 119 mg/dL — ABNORMAL HIGH (ref 70–99)
Glucose-Capillary: 116 mg/dL — ABNORMAL HIGH (ref 70–99)
Glucose-Capillary: 123 mg/dL — ABNORMAL HIGH (ref 70–99)
Glucose-Capillary: 127 mg/dL — ABNORMAL HIGH (ref 70–99)
Glucose-Capillary: 115 mg/dL — ABNORMAL HIGH (ref 70–99)

## 2022-06-14 LAB — PHOSPHORUS
Phosphorus: 3.5 mg/dL (ref 2.5–4.6)
Phosphorus: 3.2 mg/dL (ref 2.5–4.6)

## 2022-06-14 LAB — SODIUM, URINE, RANDOM: Sodium, Ur: 74 mmol/L

## 2022-06-14 LAB — LACTIC ACID, PLASMA
Lactic Acid, Venous: 1.1 mmol/L (ref 0.5–1.9)
Lactic Acid, Venous: 1.5 mmol/L (ref 0.5–1.9)

## 2022-06-14 LAB — TROPONIN I (HIGH SENSITIVITY)
Troponin I (High Sensitivity): 41 ng/L — ABNORMAL HIGH (ref <18)
Troponin I (High Sensitivity): 41 ng/L — ABNORMAL HIGH (ref <18)
Troponin I (High Sensitivity): 45 ng/L — ABNORMAL HIGH (ref <18)

## 2022-06-14 LAB — TSH: TSH: 2.31 u[IU]/mL (ref 0.350–4.500)

## 2022-06-14 LAB — BLOOD GAS, VENOUS
Acid-Base Excess: 2.3 mmol/L — ABNORMAL HIGH (ref 0.0–2.0)
Bicarbonate: 26.5 mmol/L (ref 20.0–28.0)
O2 Saturation: 89.6 %
Patient temperature: 37
pCO2, Ven: 39 mmHg — ABNORMAL LOW (ref 44–60)
pH, Ven: 7.44 — ABNORMAL HIGH (ref 7.25–7.43)
pO2, Ven: 56 mmHg — ABNORMAL HIGH (ref 32–45)

## 2022-06-14 LAB — AMMONIA: Ammonia: 21 umol/L (ref 9–35)

## 2022-06-14 LAB — HIV ANTIBODY (ROUTINE TESTING W REFLEX): HIV Screen 4th Generation wRfx: NONREACTIVE

## 2022-06-14 LAB — PROTIME-INR
INR: 1.2 (ref 0.8–1.2)
Prothrombin Time: 15.5 seconds — ABNORMAL HIGH (ref 11.4–15.2)

## 2022-06-14 LAB — CBG MONITORING, ED
Glucose-Capillary: 89 mg/dL (ref 70–99)
Glucose-Capillary: 99 mg/dL (ref 70–99)
Glucose-Capillary: 100 mg/dL — ABNORMAL HIGH (ref 70–99)

## 2022-06-14 LAB — PREALBUMIN: Prealbumin: 12 mg/dL — ABNORMAL LOW (ref 18–38)

## 2022-06-14 LAB — MAGNESIUM
Magnesium: 2.6 mg/dL — ABNORMAL HIGH (ref 1.7–2.4)
Magnesium: 2.5 mg/dL — ABNORMAL HIGH (ref 1.7–2.4)

## 2022-06-14 LAB — ECHOCARDIOGRAM COMPLETE
Area-P 1/2: 3.12 cm2
Height: 69 in
S' Lateral: 3.1 cm
Weight: 1947.1 oz

## 2022-06-14 LAB — MRSA NEXT GEN BY PCR, NASAL: MRSA by PCR Next Gen: NOT DETECTED

## 2022-06-14 LAB — HEMOGLOBIN A1C
Hgb A1c MFr Bld: 5.9 % — ABNORMAL HIGH (ref 4.8–5.6)
Mean Plasma Glucose: 122.63 mg/dL

## 2022-06-14 LAB — CK
Total CK: 975 U/L — ABNORMAL HIGH (ref 49–397)
Total CK: 968 U/L — ABNORMAL HIGH (ref 49–397)

## 2022-06-14 LAB — LIPASE, BLOOD: Lipase: 61 U/L — ABNORMAL HIGH (ref 11–51)

## 2022-06-14 LAB — CREATININE, URINE, RANDOM: Creatinine, Urine: 132 mg/dL

## 2022-06-14 MED ORDER — SENNOSIDES-DOCUSATE SODIUM 8.6-50 MG PO TABS: 2.0000 | ORAL_TABLET | Freq: Two times a day (BID) | ORAL | Status: DC

## 2022-06-14 MED ORDER — OXYCODONE HCL 5 MG/5ML PO SOLN: 2.5000 mg | ORAL | Status: DC | PRN

## 2022-06-14 MED ORDER — HYDROCODONE-ACETAMINOPHEN 5-325 MG PO TABS: 1.0000 | ORAL_TABLET | ORAL | Status: DC | PRN

## 2022-06-14 MED ORDER — DEXTROSE 5 % IV SOLN: INTRAVENOUS | Status: DC

## 2022-06-14 MED ORDER — MILK AND MOLASSES ENEMA
1.0000 | Freq: Once | RECTAL | Status: AC
  Administered 2022-06-14: 240 mL via RECTAL
  Filled 2022-06-14: qty 240

## 2022-06-14 MED ORDER — ACETAMINOPHEN 325 MG PO TABS: 650.0000 mg | ORAL_TABLET | Freq: Four times a day (QID) | ORAL | Status: DC | PRN

## 2022-06-14 MED ORDER — ACETAMINOPHEN 650 MG RE SUPP
650.0000 mg | Freq: Four times a day (QID) | RECTAL | Status: DC | PRN
  Administered 2022-06-16: 650 mg via RECTAL
  Filled 2022-06-14: qty 1

## 2022-06-14 MED ORDER — BENZTROPINE MESYLATE 1 MG PO TABS
1.0000 mg | ORAL_TABLET | Freq: Every day | ORAL | Status: DC
  Filled 2022-06-14 (×2): qty 1

## 2022-06-14 MED ORDER — ORAL CARE MOUTH RINSE: 15.0000 mL | OROMUCOSAL | Status: DC | PRN

## 2022-06-14 MED ORDER — CHLORHEXIDINE GLUCONATE CLOTH 2 % EX PADS
6.0000 | MEDICATED_PAD | Freq: Every day | CUTANEOUS | Status: DC
  Administered 2022-06-14 – 2022-06-16 (×3): 6 via TOPICAL

## 2022-06-14 MED ORDER — MEDIHONEY WOUND/BURN DRESSING EX PSTE
1.0000 | PASTE | Freq: Every day | CUTANEOUS | Status: DC
  Administered 2022-06-14 – 2022-06-16 (×3): 1 via TOPICAL
  Filled 2022-06-14 (×2): qty 44

## 2022-06-14 MED ORDER — SODIUM CHLORIDE 0.9 % IV SOLN
1.0000 g | Freq: Two times a day (BID) | INTRAVENOUS | Status: DC
  Administered 2022-06-14 – 2022-06-16 (×6): 1 g via INTRAVENOUS
  Filled 2022-06-14 (×8): qty 20

## 2022-06-14 MED ORDER — DEXTROSE-NACL 5-0.45 % IV SOLN: INTRAVENOUS | Status: DC

## 2022-06-14 MED ORDER — INSULIN ASPART 100 UNIT/ML IJ SOLN: 0.0000 [IU] | INTRAMUSCULAR | Status: DC

## 2022-06-14 MED ORDER — POLYETHYLENE GLYCOL 3350 17 G PO PACK: 17.0000 g | PACK | Freq: Every day | ORAL | Status: DC | PRN

## 2022-06-14 MED ORDER — ORAL CARE MOUTH RINSE
15.0000 mL | OROMUCOSAL | Status: DC
  Administered 2022-06-14 – 2022-06-16 (×9): 15 mL via OROMUCOSAL

## 2022-06-14 MED ORDER — BISACODYL 10 MG RE SUPP: 10.0000 mg | Freq: Every day | RECTAL | Status: DC | PRN

## 2022-06-14 MED ORDER — HALOPERIDOL LACTATE 5 MG/ML IJ SOLN: 2.0000 mg | Freq: Four times a day (QID) | INTRAMUSCULAR | Status: DC | PRN

## 2022-06-14 NOTE — TOC Initial Note (Signed)
Transition of Care [facility]) - Initial/Assessment Note    Patient Details  Name: [name] MRN: [id number] Date of Birth: 1950-11-12  Transition of Care [facility]) CM/SW Contact:    Arlean Thies, Marta Lamas, LCSW Phone Number: 06/14/2022, 7:35 AM  Clinical Narrative:                  71 year old male presenting to emergency department for abnormal labs and Hypernatremia.  Patient resides at [facility], and will return for long-term care services, once medically stable and ready for discharge.  Patient with Chronic Paranoid Schizophrenia and Dementia with Behavioral Disturbance, is currently refusing to eat or drink.  Patient Goals and CMS Choice   N/A   Expected Discharge Plan and Services  Return to [facility] to receive long-term care services.  Prior Living Arrangements/Services   [place].    Activities of Daily Living  Patient requires assistance with bathing, dressing, meal preparation, medication administration and management, etc.  Permission Sought/Granted  N/A  Emotional Assessment  N/A - Patient has history of Chronic Paranoid Schizophrenia, as well as Dementia with Behavioral Disturbance, currently refusing oral intake.    Admission diagnosis:  Hypernatremia [E87.0] Patient Active Problem List   Diagnosis Date Noted   Paroxysmal atrial fibrillation ([place]) 06/14/2022   Constipation 06/14/2022   Elevated troponin 06/14/2022   Pressure injury of skin 06/14/2022   Hypernatremia 06/13/2022   Severely aggressive behavior    Acute UTI 11/27/2020   Chronic kidney disease, stage 3a ([place]) 11/27/2020   Thrombocytopenia ([place]) 11/27/2020   Acute cystitis without hematuria    Closed fracture of bone of right foot    Altered mental status 07/07/2020   Insomnia 06/21/2020   Anxiety 06/18/2020   Acute encephalopathy    Cognitive impairment 06/12/2020   AMS (altered  mental status) 06/05/2020   H/O noncompliance with medical treatment, presenting hazards to health    DVT (deep venous thrombosis) ([place]) 06/04/2020   Schizophrenia ([place])    Homeless    Dislocation of tarsometatarsal joint    History of hepatitis B 04/26/2020   Chronic hepatitis ([place])    Dementia with behavioral disturbance ([place])    Atrial fibrillation with RVR ([place]) 04/24/2020   Malnutrition of moderate degree 04/24/2020   History of drug abuse ([place]) 10/18/2017   Tobacco abuse    Diastolic heart failure ([place]) 02/05/2015   Healthcare maintenance 05/28/2014   Hyperlipidemia 03/10/2014   BRANCH RETINAL VEIN OCCLUSION 06/03/2010   PARANOID SCHIZOPHRENIA, CHRONIC 01/17/2009   Hypertension 01/17/2009   BPH (benign prostatic hyperplasia) 01/17/2009   History of hepatitis C 12/14/2008   DEPRESSION 12/14/2008   PCP:  Jacelyn Grip, MD Pharmacy:   [facility] [place], [place] [address] Phone: [phone] Fax: [phone]  Social Determinants of Health (SDOH) Interventions   CSW will collaborate with admissions coordinator at [facility] and [facility], to confirm patient's ability to return for long-term care services, once medically stable and ready for discharge.  CSW will update FL-2 Form, and arrange for non-emergency ambulance transport from [facility] to [facility] and [place].  Readmission Risk Interventions    11/28/2020   12:21 PM  Readmission Risk Prevention Plan  Transportation Screening Complete  Medication Review (RN Care Manager) Complete  HRI or Home Care Consult Complete  SW Recovery Care/Counseling Consult Complete  Palliative Care Screening Not  Applicable  Skilled Nursing Facility Complete    Nat Christen, BSW, MSW, LCSW  Licensed Clinical Social Worker  [facility] [place]. [address]  [place], [place] [address] Physical Address-[address], [place], [place] [address] Toll Free Main # [phone] Fax # [phone] Cell # [phone] [email or web address]

## 2022-06-14 NOTE — Consult Note (Signed)
WOC Nurse Consult Note: Reason for Consult:Stage 2 x2 and Stage 3 x 1 in the sacral area Wound type:Pressure plus moisture Pressure Injury POA: Yes Measurement: Sacral:  2cm x 0.5cm x 0.1cm, also secondary stage 3 PI: 2.5cm round x 0.2cm Wound UYZ:JQDU, dry, also secondary Stage 3 PI with thin fibrinous yellow tissue Drainage (amount, consistency, odor) Scant to small serous Periwound: intact, dry Dressing procedure/placement/frequency: Patient is in the ICU and is on a mattress replacement with low air loss feature. I am adding guidance for Nursing for turning and repositioning to minimize time in the supine position, also bilateral pressure redistribution heel boots (Prevalon) to decrease risk of PI in those areas. Topical care to the sacral pressure injuries is to be with MediHoney topped with dry gauze and secured with silicone foam and changed daily after cleansing.  [place] nursing team will not follow, but will remain available to this patient, the nursing and medical teams.  Please re-consult if needed.  Thank you for inviting [place] to participate in this patient's Plan of Care.  Maudie Flakes, MSN, RN, CNS, [place], Serita Grammes, [employer], [employer] phone:  [phone]

## 2022-06-14 NOTE — Assessment & Plan Note (Signed)
In the setting of UTI and dehydration will treat underlying infection unclear what the baseline is.  Try did speak with family wife has not seen patient for the past 2 months so unsure what his current baseline is but 2 months ago he was minimally verbal

## 2022-06-14 NOTE — Assessment & Plan Note (Signed)
Order bowel regimen Include enema Milk and molasses Repeat KUB in AM.

## 2022-06-14 NOTE — Assessment & Plan Note (Signed)
In the setting of dehydration and decreased po intake Will start D5 half normal fluids Follow serial BMET

## 2022-06-14 NOTE — Assessment & Plan Note (Signed)
Chronic-stable.

## 2022-06-14 NOTE — ED Notes (Signed)
Pt had moderate return from the enema

## 2022-06-14 NOTE — Assessment & Plan Note (Addendum)
Was treated with cefepime at the nursing facility.  Still has evidence of UTI obtain urine culture in the past was resistant to cefepime and ceftriaxone.  Patient is penicillin allergic will start on imipenem  Obtain CT to evaluate for perinephric abscess given failure of outpatient treatment

## 2022-06-14 NOTE — Consult Note (Signed)
Consultation Note Date: 06/14/2022   Patient Name: [name]  DOB: 03/31/1951  MRN: [id number]  Age / Sex: 71 y.o., male   PCP: Jacelyn Grip, MD Referring Physician: [place] Eric J, DO  Reason for Consultation: Establishing goals of care     Chief Complaint/History of Present Illness:   Patient is a 71 year old male with past medical history of schizophrenia, advanced dementia, CKD stage III, history of ESBL, history of paroxysmal A-fib, history of DVT, and HFpEF who was admitted on 06/13/2022 from [facility] SNF for abnormal lab work.  During admission, patient receiving work-up and support for severe sepsis secondary to urinary tract infection in setting of history of ESBL and urinary retention, per natremia secondary to severe dehydration, severe constipation, and AKI on CKD.  Palliative care consulted to assist with complex medical decision making.  Extensive review of EMR prior to seeing patient. Unfortunately no information in paper chart from facility to review.  Presented to bedside and discussed care updates with bedside RN.  Patient laying in bed, patient nonverbal.  Patient appears lethargic.  When examining patient, lower extremities are contracted.  Also shows signs of contractions in upper extremities bilaterally. Multiple places of pressure injuries are covered by bandages. RN reported patient has a stage 3 PI on his sacrum. Patient skin noted to be dry and cracking. Toenails look uncut. Mouth incredibly dry with caking present and poor dentition.  Introduced myself to patient and tried to interact. Patient is nonverbal. Patient minimally responded to touch with looking at me with his eyes though quickly closing them. Offered patient that he is safe here and we will make sure his is comfortable.   Later in day, called [facility] SNF to further obtain information on the patient. Though call was initially answered and transferred to RN station, no staff further answered the  phone and unable to leave a voicemail despite extensive wait time on the phone. Phone eventually disconnected without answer. Unable to leave voicemail.  Attempted to call patient's wife, Herson Prichard, with contact information listed in demographics [phone]). Number rang 3 times then stated could not be completed as dialed. Attempted this twice with the same result.   Discussed care with bedside RN and hospitalist. Hospitalist to involve TOC to attempt getting in touch with family.   Primary Diagnoses  Present on Admission:  Hypernatremia  History of hepatitis C  PARANOID SCHIZOPHRENIA, CHRONIC  Hypertension  BPH (benign prostatic hyperplasia)  Hyperlipidemia  Diastolic heart failure (HCC)  Malnutrition of moderate degree  Acute encephalopathy  Acute UTI  Chronic kidney disease, stage 3a (HCC)  Paroxysmal atrial fibrillation (HCC)  Constipation  Elevated troponin   Palliative Review of Systems: Unable to obtain from patient secondary to medical status.   Past Medical History:  Diagnosis Date   Acute encephalopathy 03/18/9561   Acute metabolic encephalopathy 08/20/863   AKI (acute kidney injury) ([place]) 05/12/2020   BPH (benign prostatic hyperplasia)    Closed displaced fracture of fifth metatarsal bone of right foot    Colon polyp 2010   Crush injury    Dementia (HCC)    Depression    GERD (gastroesophageal reflux disease)    Hepatitis C    "caught it when I had a blood transfusion"   High cholesterol    History of blood transfusion    "when I was young"   Hypertension    Paranoid schizophrenia ([place])    PARANOID SCHIZOPHRENIA, CHRONIC 01/17/2009   Prostate atrophy  Retinal vein occlusion    Social History   Socioeconomic History   Marital status: Married    Spouse name: Not on file   Number of children: Not on file   Years of education: Not on file   Highest education level: Not on file  Occupational History   Not on file  Tobacco Use   Smoking status:  Every Day    Packs/day: 0.50    Years: 48.00    Total pack years: 24.00    Types: Cigarettes   Smokeless tobacco: Never  Substance and Sexual Activity   Alcohol use: Yes   Drug use: No   Sexual activity: Yes  Other Topics Concern   Not on file  Social History Narrative   Not on file   Social Determinants of Health   Financial Resource Strain: Not on file  Food Insecurity: Not on file  Transportation Needs: Not on file  Physical Activity: Not on file  Stress: Not on file  Social Connections: Not on file   Family History  Problem Relation Age of Onset   Hypertension Mother    Diabetes Mother    Stroke Mother    Scheduled Meds:  benztropine  1 mg Oral QHS   Chlorhexidine Gluconate Cloth  6 each Topical Daily   thiamine (VITAMIN B1) injection  100 mg Intravenous Daily   Continuous Infusions:  dextrose     meropenem (MERREM) IV Stopped (06/14/22 0102)   PRN Meds:.acetaminophen **OR** acetaminophen, bisacodyl, HYDROcodone-acetaminophen, mouth rinse Allergies  Allergen Reactions   Lisinopril Cough   Penicillins Hives   Amoxicillin Rash   Ibuprofen Rash   CBC:    Component Value Date/Time   WBC 7.3 06/13/2022 2105   HGB 11.8 (L) 06/13/2022 2105   HGB 14.4 04/13/2018 1057   HCT 39.0 06/13/2022 2105   HCT 41.6 04/13/2018 1057   PLT 132 (L) 06/13/2022 2105   PLT 131 (L) 04/13/2018 1057   MCV 94.4 06/13/2022 2105   MCV 88 04/13/2018 1057   NEUTROABS 5.8 06/13/2022 2105   LYMPHSABS 1.2 06/13/2022 2105   MONOABS 0.3 06/13/2022 2105   EOSABS 0.0 06/13/2022 2105   BASOSABS 0.0 06/13/2022 2105   Comprehensive Metabolic Panel:    Component Value Date/Time   NA 161 (HH) 06/14/2022 0725   NA 134 05/02/2019 1711   K 3.7 06/14/2022 0725   CL >130 (HH) 06/14/2022 0725   CO2 25 06/14/2022 0725   BUN 42 (H) 06/14/2022 0725   BUN 19 05/02/2019 1711   CREATININE 1.37 (H) 06/14/2022 0725   CREATININE 1.49 (H) 08/27/2016 1412   GLUCOSE 135 (H) 06/14/2022 0725    CALCIUM 7.9 (L) 06/14/2022 0725   AST 53 (H) 06/13/2022 2105   ALT 49 (H) 06/13/2022 2105   ALKPHOS 87 06/13/2022 2105   BILITOT 0.7 06/13/2022 2105   BILITOT 0.3 05/02/2019 1711   PROT 7.1 06/13/2022 2105   PROT 6.7 05/02/2019 1711   ALBUMIN 2.6 (L) 06/13/2022 2105   ALBUMIN 4.6 05/02/2019 1711    Physical Exam: Vital Signs: BP 138/87 (BP Location: Left Arm)   Pulse 95   Temp (!) 96.3 F (35.7 C) (Oral) Comment: Warm blankets applied  Resp 17   Ht '5\' 9"'$  (1.753 m)   Wt 55.2 kg   SpO2 99%   BMI 17.97 kg/m  SpO2: SpO2: 99 % O2 Device: O2 Device: Room Air O2 Flow Rate:   Intake/output summary:  Intake/Output Summary (Last 24 hours) at 06/14/2022 1012 Last data filed  at 06/14/2022 0341 Gross per 24 hour  Intake --  Output 1100 ml  Net -1100 ml   LBM: Last BM Date :  (PTA) Baseline Weight: Weight: 61.2 kg Most recent weight: Weight: 55.2 kg  General: lethargic, minimally opens eyes, cachetic, frail, contracted limbs Eyes: no drainage noted HENT: dry mucous membranes with dried caking and poor dentition Cardiovascular: RRR Respiratory: no increased work of breathing noted, not in respiratory distress Abdomen: not distended Extremities: no edema in LE b/l Skin: lethargic, nonverbal  Palliative Performance Scale: 10%           Additional Data Reviewed: Recent Labs    06/13/22 2105 06/14/22 0200 06/14/22 0725  WBC 7.3  --   --   HGB 11.8*  --   --   PLT 132*  --   --   NA 162* 159* 161*  BUN 48* 44* 42*  CREATININE 1.71* 1.45* 1.37*    Imaging: DG Abd 1 View CLINICAL DATA:  Constipation  EXAM: ABDOMEN - 1 VIEW  COMPARISON:  June 13, 2022  FINDINGS: The study is limited due to positioning. Moderate fecal loading throughout the colon again identified. The fecal loading in the descending colon persists but is less prominent compared to yesterday's study. Lung bases are unremarkable. No other bony or soft tissue abnormalities are  identified.  IMPRESSION: Moderate fecal loading throughout the colon. The fecal loading in the descending colon is less prominent compared to yesterday's study.  Electronically Signed   By: Dorise Bullion III M.D.   On: 06/14/2022 08:46 CT RENAL STONE STUDY CLINICAL DATA:  Neurogenic bladder dysfunction.  EXAM: CT ABDOMEN AND PELVIS WITHOUT CONTRAST  TECHNIQUE: Multidetector CT imaging of the abdomen and pelvis was performed following the standard protocol without IV contrast.  RADIATION DOSE REDUCTION: This exam was performed according to the departmental dose-optimization program which includes automated exposure control, adjustment of the mA and/or kV according to patient size and/or use of iterative reconstruction technique.  COMPARISON:  10/30/2020, 08/20/2016.  FINDINGS: Lower chest: Mild atelectasis at the right lung base.  Hepatobiliary: A stable subcentimeter hypodensity is present in the posterior right lobe of the liver which is too small to further characterize. No biliary ductal dilatation. The gallbladder is not visualized on exam.  Pancreas: There is enlargement of the pancreatic head with surrounding fat stranding, possible pancreatitis. No pancreatic ductal dilatation.  Spleen: Normal in size without focal abnormality.  Adrenals/Urinary Tract: The adrenal glands are within normal limits. No renal calculus bilaterally. There is mild fullness of the right renal pelvis and calices with no definite evidence of obstructive uropathy. The bladder is distended and otherwise within normal limits.  Stomach/Bowel: Stomach is within normal limits. Appendix appears normal. No evidence of bowel wall thickening, distention, or inflammatory changes. No free air or pneumatosis. A moderate amount of retained stool is present in the colon.  Vascular/Lymphatic: Aortic atherosclerosis. No enlarged abdominal or pelvic lymph nodes.  Reproductive: The prostate gland  is prominent in size.  Other: No abdominopelvic ascites.  Musculoskeletal: No acute osseous abnormality.  IMPRESSION: 1. No renal calculus. There is mild fullness of the renal collecting system on the right with mild perinephric fat stranding bilaterally which may be infectious or inflammatory. 2. Enlargement of the pancreatic head with suggestion of mild fat stranding, possible pancreatitis. Correlation with amylase and lipase is recommended. 3. Aortic atherosclerosis.  Electronically Signed   By: Brett Fairy M.D.   On: 06/14/2022 01:24 DG Abd 1 View CLINICAL DATA:  Hypernatremia.  EXAM: ABDOMEN - 1 VIEW  COMPARISON:  None Available.  FINDINGS: The bowel gas pattern is normal. There is a large amount of stool throughout the colon. No radio-opaque calculi seen overlying the kidneys. There are vascular calcifications in the pelvis. No acute fractures are identified.  IMPRESSION: Large amount of stool throughout the colon. No evidence of bowel obstruction.  Electronically Signed   By: Ronney Asters M.D.   On: 06/13/2022 23:59    I personally reviewed recent imaging.   Palliative Care Assessment and Plan Summary of Established Goals of Care and Medical Treatment Preferences   Patient is a 71 year old male with past medical history of schizophrenia, advanced dementia, CKD stage III, history of ESBL, history of paroxysmal A-fib, history of DVT, and HFpEF who was admitted on 06/13/2022 from [facility] SNF for abnormal lab work.  During admission, patient receiving work-up and support for severe sepsis secondary to urinary tract infection in setting of history of ESBL and urinary retention, per natremia secondary to severe dehydration, severe constipation, and AKI on CKD.  Palliative care consulted to assist with complex medical decision making.  # Complex medical decision making/goals of care  -Patient unable to participate in complex medical decision making due to his  underlying medical status.  -Attempted to gather information regarding patient's care from SNF and wife as described above in HPI though to no avail unfortunately. Hospitalist to involve TOC to attempt in contacting family to assist in medical decision making. Unsure patient's wife listed in chart is even patient's medical decision maker at this time without confirmatory information from SNF.   -Based on patient's assessment at this time including: frailty, no PO intake, skin failure in setting of multiple wounds, and end stage dementia with nonverbal/non-mobile/incontinent status, patient is appropriate for hospice focused care at this time. Would recommend comfort focused care to family/decision maker.   -  Code Status: Full Code    -Based on patient's current status, would recommend DNR/DNI status to family/decision maker as patient is at the end of his life and interventions such as resuscitative efforts with chest compressions and intubation will cause patient harm and not bring further quality of life.  Prognosis: days to weeks  # Symptom management  -Constipation   -Agree with aggressive symptom management for constipation including enema and suppository use.   -Pain, patient likely having underlying pain in setting of contractures with multiple skin wounds   -Adjusted to oxycodone solution 2.5-'5mg'$  q4hrs prn.this can be absorbed buccally and so no need for patient to swallow a pill. May need to be further adjusted based on patient's symptom burden.   # Psycho-social/Spiritual Support:  - Support System: Unknown; wife listed as only contact in EMR  # Discharge Planning:  TBD  Thank you for allowing the palliative care team to participate in the care [name].  Chelsea Aus, DO Palliative Care Provider PMT # [phone]  If patient remains symptomatic despite maximum doses, please call PMT at [phone] between 0700 and 1900. Outside of these hours, please call attending, as PMT  does not have night coverage.  This provider spent a total of 81 minutes providing patient's care.  Includes review of EMR, discussing care with other staff members involved in patient's medical care, obtaining relevant history and information from patient and/or patient's family, and personal review of imaging and lab work. Greater than 50% of the time was spent counseling and coordinating care related to the above assessment and plan.

## 2022-06-14 NOTE — Assessment & Plan Note (Signed)
-  chronic avoid nephrotoxic medications such as NSAIDs, Vanco Zosyn combo,  avoid hypotension, continue to follow renal function  

## 2022-06-14 NOTE — TOC Progression Note (Addendum)
Transition of Care [facility]) - Progression Note    Patient Details  Name: [name] MRN: [id number] Date of Birth: [birth year]  Transition of Care [facility]) CM/SW Contact  Ross Ludwig, [place] Phone Number: 06/14/2022, 2:00 PM  Clinical Narrative:     CSW was informed that patient is a LTC resident at [facility].  Per attending physician he has not been able to reach patient's wife to discuss goals of care with patient.  CSW contacted [facility] Admissions worker [phone], and Nevin Bloodgood is covering this weekend.  CSW informed her that physician is trying to reach family, however has been unsuccessful.  Per Nevin Bloodgood, the social worker at [employer] will try to contact family and call CSW back.  If SNF social worker can not reach family will consider completing wellness check on patient's spouse.    Nevin Bloodgood said the DON is Carollee Sires, [phone] if the attending physician needs to speak to her regarding plan of care for patient.  CSW to continue to follow patient's progress throughout discharge planning.  2:06pm  CSW was informed by physician that he spoke to [facility] at [facility].  Physician reported that per Bridgett, family has not seen patient since June, SNF had a meeting last week and are pursuing DSS guardianship for patient.  CSW to continue to follow patient's progress throughout discharge planning.       Expected Discharge Plan and Services    Back to [facility] where she is LTC resident.  Possible hospice at facility.                                             Social Determinants of Health (SDOH) Interventions    Readmission Risk Interventions    11/28/2020   12:21 PM  Readmission Risk Prevention Plan  Transportation Screening Complete  Medication Review (RN Care Manager) Complete  HRI or Home Care Consult Complete  SW Recovery Care/Counseling Consult Complete  Palliative Care Screening Not Applicable  Skilled Nursing Facility Complete

## 2022-06-14 NOTE — Assessment & Plan Note (Signed)
Check hepatitis viral load

## 2022-06-14 NOTE — Assessment & Plan Note (Signed)
Patient currently denies any chest pain  Probably demand ischemia in the setting of dehydration   nfection.  - EKG without evidence of acute ischemia  - Admitted to telemetry   - continue to cycle cardiac enzymes   - obtain echogram in AM

## 2022-06-14 NOTE — Progress Notes (Signed)
PROGRESS NOTE    [name]  ZWC:[id number] DOB: Apr 14, 1951 DOA: 06/13/2022 PCP: Jacelyn Grip, MD    Brief Narrative:   [name] is a 71 y.o. male with past medical history significant for schizophrenia, advanced dementia, CKD stage IIIa, history of ESBL E. coli, history of paroxysmal atrial fibrillation, history of DVT, chronic diastolic congestive heart failure who presented to [facility] ED on 06/13/2022 from [facility] SNF with abnormal labs.  Nursing facility reported that his sodium was high but no printout of the lab report was given.  Given patient's advanced dementia, schizophrenia unable to participate in history; in which was obtained from chart review and ED provider report.  No family present at bedside; and spouse reportedly has not seen patient over the past 2 months.  In the ED, temperature 101.1 F, HR 118, RR 21, BP 113/92, SPO2 98% on room air.  WBC 7.3, hemoglobin 11.8, platelet count 132.  Sodium 162, potassium 3.8, chloride 130, CO2 27, glucose 119, BUN 48, creatinine 1.71.  AST 53, ALT 49, total bilirubin 0.7.  Lipase 61, ammonia level 21.  Lactic acid 1.5.  CK9 75.  High sensitive troponin 41>45.  Urinalysis with moderate leukocytes, positive nitrite, many bacteria, greater than 50 WBCs.  Chest x-ray with no active cardiopulmonary disease process.  CT renal stone study with no renal calculus, mild fullness of the renal collecting system on the right with mild perinephric fat stranding bilaterally we may be infectious versus inflammatory, enlargement of the pancreatic head with suggestion of mild fat stranding possible pancreatitis.  Abdominal x-ray with large amount of stool throughout the colon.  Assessment & Plan:   Severe sepsis secondary to urinary tract infection Hx ESBL UTI Patient presenting to the ED from SNF and notably febrile 101.1 F, tachycardic, tachypneic with endorgan damage of acute renal failure.  Urinalysis suggestive of urinary tract infection.  Previous  history of ESBL E. coli UTI.  Foley catheter placed in the ED with purulence noted in collection bag. -- Blood cultures x2: Pending -- Urine culture: Pending -- Meropenem 1 g IV every 12 hours  Hypernatremia secondary to severe dehydration Patient presenting with a sodium of 162, likely secondary to severe dehydration in the setting of poor oral intake, adult failure to thrive. -- Na [phone] -- Discontinue D5 half NS in favor of D5W at 119m/h -- BMP every 6 hours -- Goal sodium correction 8-10 meq in 24 hour period  Urinary retention Foley catheter placed in the ED. -- Continue monitor urinary output  Severe constipation Likely secondary to severe dehydration in the setting of advanced dementia/schizophrenia with poor mobility status.  Received milk of molasses enema in the ED. -- Senokot 2 tablets p.o. twice daily -- MiraLAX daily as needed -- Continue monitor BMs closely  Acute renal failure on CKD stage IIIa Etiology likely secondary to severe dehydration in the setting of adult failure to thrive, poor oral intake. --Cr 1.71>1.45>1.37 --Continue IV fluid hydration --Avoid nephrotoxins, renal dose all medications --BMP as above  Elevated troponin Troponin elevated 41 followed by 45, EKG with normal sinus rhythm without any concerning dynamic changes.  Etiology likely secondary to type II demand ischemia in the setting of severe sepsis, severe dehydration as above. -- Continue treatment as above with IV fluids, IV antibiotics -- Monitor on telemetry  Schizophrenia Advanced dementia -- Continue Cogentin 1 mg p.o. nightly -- SLP for swallow evaluation prior to starting diet -- Awaiting further pharmacy medication reconciliation  Chronic diastolic congestive  heart failure, compensated -- Awaiting medication reconciliation from SNF -- Strict I's and O's and daily weights  History of paroxysmal atrial fibrillation History of DVT Does not appear to be on chronic  anticoagulation outpatient.  EKG on admission normal sinus rhythm. -- Monitor on telemetry  Adult failure to thrive Patient presenting from SNF with severe dehydration, severe sepsis in the setting of advanced dementia/schizophrenia and appears to be bedbound.  Overall very poor prognosis.  Bouse is apparently not seen patients in roughly 2 months. --SLP for swallow evaluation --Palliative care consulted for assistance with goals of care medical decision making.  Sacral decubitus ulcer, POA Pressure Injury 06/13/22 Sacrum Posterior Stage 3 -  Full thickness tissue loss. Subcutaneous fat may be visible but bone, tendon or muscle are NOT exposed. Pt has a quarter size decubitus on the sacrum that is covered by a duoderm (Active)  06/13/22 2000  Location: Sacrum  Location Orientation: Posterior  Staging: Stage 3 -  Full thickness tissue loss. Subcutaneous fat may be visible but bone, tendon or muscle are NOT exposed.  Wound Description (Comments): Pt has a quarter size decubitus on the sacrum that is covered by a duoderm  Present on Admission: Yes  -- Wound care RN consulted, continue local wound care, offloading   DVT prophylaxis: SCDs Start: 06/14/22 0545    Code Status: Full Code Family Communication:   Disposition Plan:  Level of care: Stepdown Status is: Inpatient Remains inpatient appropriate because: IV antibiotics, IV fluids, awaiting palliative care consultation, overall very poor prognosis given his comorbidities and adult failure to thrive    Consultants:  Palliative care: Pending  Procedures:  None  Antimicrobials:  Meropenem 10/28>>   Subjective: Patient seen examined bedside, resting comfortably.  Remains in ED holding area.  No family present.  Nonverbal/noncommunicative.  Unable to obtain any ROS from patient.  Discussed with RN, no acute concerns at this time.  Objective: Vitals:   06/14/22 0600 06/14/22 0700 06/14/22 0900 06/14/22 1000  BP: 131/89 99/85  138/87 135/77  Pulse: (!) 112 94 95 92  Resp: (!) '24 18 17 19  '$ Temp: 99.8 F (37.7 C)  (!) 96.3 F (35.7 C) 97.9 F (36.6 C)  TempSrc:   Oral Oral  SpO2: 98% 98% 99% 100%  Weight:   55.2 kg   Height:   '5\' 9"'$  (1.753 m)     Intake/Output Summary (Last 24 hours) at 06/14/2022 1057 Last data filed at 06/14/2022 0341 Gross per 24 hour  Intake --  Output 1100 ml  Net -1100 ml   Filed Weights   06/13/22 2037 06/14/22 0900  Weight: 61.2 kg 55.2 kg    Examination:  Physical Exam: GEN: NAD, alert, confused; chronically ill/cachectic in appearance; appears older than stated age HEENT: NCAT, PERRL, EOMI, sclera clear, dry mucous membranes, poor dentition PULM: CTAB w/o wheezes/crackles, normal respiratory effort, on room air CV: Tachycardic, regular rhythm w/o M/G/R GI: abd soft, NTND, NABS, no R/G/M MSK: no peripheral edema, slightly contracted, muscle wasting/fat depletion noted Integumentary: Sacral wound noted and depicted as below     Data Reviewed: I have personally reviewed following labs and imaging studies  CBC: Recent Labs  Lab 06/13/22 2105 06/14/22 0927  WBC 7.3 7.2  NEUTROABS 5.8  --   HGB 11.8* 10.1*  HCT 39.0 33.4*  MCV 94.4 95.4  PLT 132* 939*   Basic Metabolic Panel: Recent Labs  Lab 06/13/22 2105 06/13/22 2350 06/14/22 0200 06/14/22 0725 06/14/22 0927  NA 162*  --  159* 161*  --   K 3.8  --  3.8 3.7  --   CL 130*  --  >130* >130*  --   CO2 27  --  24 25  --   GLUCOSE 119*  --  106* 135*  --   BUN 48*  --  44* 42*  --   CREATININE 1.71*  --  1.45* 1.37*  --   CALCIUM 8.7*  --  8.2* 7.9*  --   MG  --  2.6*  --   --  2.5*  PHOS  --  3.5  --   --  3.2   GFR: Estimated Creatinine Clearance: 39.2 mL/min (A) (by C-G formula based on SCr of 1.37 mg/dL (H)). Liver Function Tests: Recent Labs  Lab 06/13/22 2105  AST 53*  ALT 49*  ALKPHOS 87  BILITOT 0.7  PROT 7.1  ALBUMIN 2.6*   Recent Labs  Lab 06/14/22 0200  LIPASE 61*   Recent  Labs  Lab 06/13/22 2350  AMMONIA 21   Coagulation Profile: Recent Labs  Lab 06/13/22 2350  INR 1.2   Cardiac Enzymes: Recent Labs  Lab 06/13/22 2350 06/14/22 0511  CKTOTAL 975* 968*   BNP (last 3 results) No results for input(s): "PROBNP" in the last 8760 hours. HbA1C: No results for input(s): "HGBA1C" in the last 72 hours. CBG: Recent Labs  Lab 06/14/22 0154 06/14/22 0408 06/14/22 0725 06/14/22 1025  GLUCAP 89 99 100* 87   Lipid Profile: No results for input(s): "CHOL", "HDL", "LDLCALC", "TRIG", "CHOLHDL", "LDLDIRECT" in the last 72 hours. Thyroid Function Tests: Recent Labs    06/14/22 0511  TSH 2.310   Anemia Panel: No results for input(s): "VITAMINB12", "FOLATE", "FERRITIN", "TIBC", "IRON", "RETICCTPCT" in the last 72 hours. Sepsis Labs: Recent Labs  Lab 06/13/22 2350 06/14/22 0206  LATICACIDVEN 1.1 1.5    Recent Results (from the past 240 hour(s))  MRSA Next Gen by PCR, Nasal     Status: None   Collection Time: 06/14/22  9:15 AM   Specimen: Nasal Mucosa; Nasal Swab  Result Value Ref Range Status   MRSA by PCR Next Gen NOT DETECTED NOT DETECTED Final    Comment: (NOTE) The GeneXpert MRSA Assay (FDA approved for NASAL specimens only), is one component of a comprehensive MRSA colonization surveillance program. It is not intended to diagnose MRSA infection nor to guide or monitor treatment for MRSA infections. Test performance is not FDA approved in patients less than 61 years old. Performed at [facility], [place] [address], [place], [place] [address]          Radiology Studies: DG Abd 1 View  Result Date: 06/14/2022 CLINICAL DATA:  Constipation EXAM: ABDOMEN - 1 VIEW COMPARISON:  June 13, 2022 FINDINGS: The study is limited due to positioning. Moderate fecal loading throughout the colon again identified. The fecal loading in the descending colon persists but is less prominent compared to yesterday's study. Lung bases are  unremarkable. No other bony or soft tissue abnormalities are identified. IMPRESSION: Moderate fecal loading throughout the colon. The fecal loading in the descending colon is less prominent compared to yesterday's study. Electronically Signed   By: Dorise Bullion III M.D.   On: 06/14/2022 08:46   CT RENAL STONE STUDY  Result Date: 06/14/2022 CLINICAL DATA:  Neurogenic bladder dysfunction. EXAM: CT ABDOMEN AND PELVIS WITHOUT CONTRAST TECHNIQUE: Multidetector CT imaging of the abdomen and pelvis was performed following the standard protocol without IV contrast. RADIATION DOSE REDUCTION:  This exam was performed according to the departmental dose-optimization program which includes automated exposure control, adjustment of the mA and/or kV according to patient size and/or use of iterative reconstruction technique. COMPARISON:  10/30/2020, 08/20/2016. FINDINGS: Lower chest: Mild atelectasis at the right lung base. Hepatobiliary: A stable subcentimeter hypodensity is present in the posterior right lobe of the liver which is too small to further characterize. No biliary ductal dilatation. The gallbladder is not visualized on exam. Pancreas: There is enlargement of the pancreatic head with surrounding fat stranding, possible pancreatitis. No pancreatic ductal dilatation. Spleen: Normal in size without focal abnormality. Adrenals/Urinary Tract: The adrenal glands are within normal limits. No renal calculus bilaterally. There is mild fullness of the right renal pelvis and calices with no definite evidence of obstructive uropathy. The bladder is distended and otherwise within normal limits. Stomach/Bowel: Stomach is within normal limits. Appendix appears normal. No evidence of bowel wall thickening, distention, or inflammatory changes. No free air or pneumatosis. A moderate amount of retained stool is present in the colon. Vascular/Lymphatic: Aortic atherosclerosis. No enlarged abdominal or pelvic lymph nodes.  Reproductive: The prostate gland is prominent in size. Other: No abdominopelvic ascites. Musculoskeletal: No acute osseous abnormality. IMPRESSION: 1. No renal calculus. There is mild fullness of the renal collecting system on the right with mild perinephric fat stranding bilaterally which may be infectious or inflammatory. 2. Enlargement of the pancreatic head with suggestion of mild fat stranding, possible pancreatitis. Correlation with amylase and lipase is recommended. 3. Aortic atherosclerosis. Electronically Signed   By: Brett Fairy M.D.   On: 06/14/2022 01:24   DG Abd 1 View  Result Date: 06/13/2022 CLINICAL DATA:  Hypernatremia. EXAM: ABDOMEN - 1 VIEW COMPARISON:  None Available. FINDINGS: The bowel gas pattern is normal. There is a large amount of stool throughout the colon. No radio-opaque calculi seen overlying the kidneys. There are vascular calcifications in the pelvis. No acute fractures are identified. IMPRESSION: Large amount of stool throughout the colon. No evidence of bowel obstruction. Electronically Signed   By: Ronney Asters M.D.   On: 06/13/2022 23:59   DG CHEST PORT 1 VIEW  Result Date: 06/13/2022 CLINICAL DATA:  Tachycardia EXAM: PORTABLE CHEST 1 VIEW COMPARISON:  Chest x-ray 01/03/2021 FINDINGS: The heart size and mediastinal contours are within normal limits. Both lungs are clear. The visualized skeletal structures are unremarkable. IMPRESSION: No active disease. Electronically Signed   By: Ronney Asters M.D.   On: 06/13/2022 23:57        Scheduled Meds:  benztropine  1 mg Oral QHS   Chlorhexidine Gluconate Cloth  6 each Topical Daily   thiamine (VITAMIN B1) injection  100 mg Intravenous Daily   Continuous Infusions:  dextrose 100 mL/hr at 06/14/22 1031   meropenem (MERREM) IV Stopped (06/14/22 0102)     LOS: 1 day    Time spent: 52 minutes spent on chart review, discussion with nursing staff, consultants, updating family and interview/physical exam; more  than 50% of that time was spent in counseling and/or coordination of care.    Corey Chea J [place] DO [facility] Available via Epic secure chat 7am-7pm After these hours, please refer to coverage provider listed on [email or web address] 06/14/2022, 10:57 AM

## 2022-06-14 NOTE — Progress Notes (Signed)
Echocardiogram 2D Echocardiogram has been performed.  [name] 06/14/2022, 11:47 AM

## 2022-06-14 NOTE — Assessment & Plan Note (Signed)
Order nutritional consult

## 2022-06-14 NOTE — Assessment & Plan Note (Signed)
Currently not on statin

## 2022-06-14 NOTE — Assessment & Plan Note (Signed)
Restart Flomax when able to tolerate p.o.

## 2022-06-14 NOTE — Assessment & Plan Note (Signed)
Allow permissive hypertension 

## 2022-06-14 NOTE — Assessment & Plan Note (Signed)
Chronic stable avoid fluid overload

## 2022-06-14 NOTE — Evaluation (Signed)
Clinical/Bedside Swallow Evaluation Patient Details  Name: [name] MRN: [id number] Date of Birth: 09-09-1950  Today's Date: 06/14/2022 Time: SLP Start Time (ACUTE ONLY): 8563 SLP Stop Time (ACUTE ONLY): 1497 SLP Time Calculation (min) (ACUTE ONLY): 11 min  Past Medical History:  Past Medical History:  Diagnosis Date   Acute encephalopathy 0/09/6376   Acute metabolic encephalopathy 12/22/8500   AKI (acute kidney injury) ([place]) 05/12/2020   BPH (benign prostatic hyperplasia)    Closed displaced fracture of fifth metatarsal bone of right foot    Colon polyp 2010   Crush injury    Dementia ([facility])    Depression    GERD (gastroesophageal reflux disease)    Hepatitis C    "caught it when I had a blood transfusion"   High cholesterol    History of blood transfusion    "when I was young"   Hypertension    Paranoid schizophrenia ([place])    PARANOID SCHIZOPHRENIA, CHRONIC 01/17/2009   Prostate atrophy    Retinal vein occlusion    Past Surgical History:  Past Surgical History:  Procedure Laterality Date   CIRCUMCISION  1959   ORIF TOE FRACTURE Right 04/30/2020   Procedure: OPEN REDUCTION INTERNAL FIXATION (ORIF) RIGHT FOOT METATARSAL FRACTURES;  Surgeon: Newt Minion, MD;  Location: [place] [facility];  Service: Orthopedics;  Laterality: Right;   TONSILLECTOMY     HPI:  Pt is a 71 y.o. male who presented to the ED secondary to abnormal labs. Pt found to have severe sepsis secondary to UTI in setting of history of ESBL and urinary retention, hypernatremia secondary to severe dehydration, severe constipation, and AKI on CKD, encephalopathy, and per referring MD's note, pt not eating/drinking. Palliative care has been consulted. PMH: schizophrenia, dementia, chronic kidney disease stage IIIa, recent history of ESBL UTI , a.fib, DVT    Assessment / Plan / Recommendation  Clinical Impression  Pt was seen for bedside swallow evaluation. Pt did not communicate verbally and was therefore unable to provide  any history. Oral mechanism exam was limited due to pt's inability to follow commands; however, he presented with two mandibular teeth only and a dry oral mucosa was noted. Pt presented with symptoms of  oropharyngeal dysphagia including impaired bolus awareness, oral holding, and signs of aspiration with thin liquids via spoon. A pharyngeal delay is suspected considering time of bolus presentation versus that of hyolaryngeal movement. It is recommended that the pt's NPO status be maintained at this time. SLP will follow to assess improvement in swallow function and for diet initiation as clinically indicated. SLP Visit Diagnosis: Dysphagia, unspecified (R13.10)    Aspiration Risk  Moderate aspiration risk;Severe aspiration risk;Risk for inadequate nutrition/hydration    Diet Recommendation NPO;Alternative means - temporary   Medication Administration: Via alternative means    Other  Recommendations Oral Care Recommendations: Oral care QID    Recommendations for follow up therapy are one component of a multi-disciplinary discharge planning process, led by the attending physician.  Recommendations may be updated based on patient status, additional functional criteria and insurance authorization.  Follow up Recommendations  (TBD)      Assistance Recommended at Discharge Frequent or constant Supervision/Assistance  Functional Status Assessment Patient has had a recent decline in their functional status and demonstrates the ability to make significant improvements in function in a reasonable and predictable amount of time.  Frequency and Duration min 2x/week  2 weeks       Prognosis Prognosis for Safe Diet Advancement: Fair Barriers to  Reach Goals: Severity of deficits;Time post onset;Cognitive deficits      Swallow Study   General Date of Onset: 06/13/22 HPI: Pt is a 71 y.o. male who presented to the ED secondary to abnormal labs. Pt found to have severe sepsis secondary to UTI in  setting of history of ESBL and urinary retention, hypernatremia secondary to severe dehydration, severe constipation, and AKI on CKD, encephalopathy, and per referring MD's note, pt not eating/drinking. Palliative care has been consulted. PMH: schizophrenia, dementia, chronic kidney disease stage IIIa, recent history of ESBL UTI , a.fib, DVT Type of Study: Bedside Swallow Evaluation Previous Swallow Assessment: none Diet Prior to this Study: NPO Temperature Spikes Noted: No Respiratory Status: Room air History of Recent Intubation: No Behavior/Cognition: Lethargic/Drowsy;Requires cueing;Doesn't follow directions Oral Cavity Assessment: Dried secretions;Dry Oral Cavity - Dentition: Missing dentition (limited dentition) Self-Feeding Abilities: Total assist Patient Positioning: Upright in bed;Postural control adequate for testing Baseline Vocal Quality: Not observed Volitional Cough: Cognitively unable to elicit Volitional Swallow: Unable to elicit    Oral/Motor/Sensory Function Overall Oral Motor/Sensory Function:  (UTA)   Ice Chips Ice chips: Impaired Presentation: Spoon Oral Phase Impairments: Poor awareness of bolus Oral Phase Functional Implications: Oral holding   Thin Liquid Thin Liquid: Impaired Presentation: Spoon Oral Phase Impairments: Poor awareness of bolus Pharyngeal  Phase Impairments: Suspected delayed Swallow;Cough - Immediate    Nectar Thick Nectar Thick Liquid: Not tested   Honey Thick Honey Thick Liquid: Not tested   Puree Puree: Impaired Presentation: Spoon Oral Phase Impairments: Poor awareness of bolus Oral Phase Functional Implications: Oral holding   Solid     Solid: Not tested     Callahan Wild I. Hardin Negus, [place], [place] Office number [phone]  Horton Marshall 06/14/2022,3:01 PM

## 2022-06-14 NOTE — Assessment & Plan Note (Signed)
Not on anticoagulation continue to monitor currently in sinus rhythm

## 2022-06-15 DIAGNOSIS — A419 Sepsis, unspecified organism: Secondary | ICD-10-CM | POA: Diagnosis not present

## 2022-06-15 DIAGNOSIS — F03C0 Unspecified dementia, severe, without behavioral disturbance, psychotic disturbance, mood disturbance, and anxiety: Secondary | ICD-10-CM

## 2022-06-15 DIAGNOSIS — G9340 Encephalopathy, unspecified: Secondary | ICD-10-CM | POA: Diagnosis not present

## 2022-06-15 DIAGNOSIS — K5900 Constipation, unspecified: Secondary | ICD-10-CM | POA: Diagnosis not present

## 2022-06-15 DIAGNOSIS — Z515 Encounter for palliative care: Secondary | ICD-10-CM | POA: Diagnosis not present

## 2022-06-15 DIAGNOSIS — E870 Hyperosmolality and hypernatremia: Secondary | ICD-10-CM | POA: Diagnosis not present

## 2022-06-15 DIAGNOSIS — N179 Acute kidney failure, unspecified: Secondary | ICD-10-CM | POA: Diagnosis not present

## 2022-06-15 LAB — BASIC METABOLIC PANEL
Anion gap: 4 — ABNORMAL LOW (ref 5–15)
Anion gap: 4 — ABNORMAL LOW (ref 5–15)
BUN: 36 mg/dL — ABNORMAL HIGH (ref 8–23)
BUN: 35 mg/dL — ABNORMAL HIGH (ref 8–23)
CO2: 25 mmol/L (ref 22–32)
CO2: 25 mmol/L (ref 22–32)
Calcium: 8 mg/dL — ABNORMAL LOW (ref 8.9–10.3)
Calcium: 7.9 mg/dL — ABNORMAL LOW (ref 8.9–10.3)
Chloride: 126 mmol/L — ABNORMAL HIGH (ref 98–111)
Chloride: 123 mmol/L — ABNORMAL HIGH (ref 98–111)
Creatinine, Ser: 1.17 mg/dL (ref 0.61–1.24)
Creatinine, Ser: 1.11 mg/dL (ref 0.61–1.24)
GFR, Estimated: >60 mL/min (ref >60)
GFR, Estimated: >60 mL/min (ref >60)
Glucose, Bld: 128 mg/dL — ABNORMAL HIGH (ref 70–99)
Glucose, Bld: 120 mg/dL — ABNORMAL HIGH (ref 70–99)
Potassium: 3.7 mmol/L (ref 3.5–5.1)
Potassium: 3.2 mmol/L — ABNORMAL LOW (ref 3.5–5.1)
Sodium: 155 mmol/L — ABNORMAL HIGH (ref 135–145)
Sodium: 152 mmol/L — ABNORMAL HIGH (ref 135–145)

## 2022-06-15 LAB — GLUCOSE, CAPILLARY
Glucose-Capillary: 105 mg/dL — ABNORMAL HIGH (ref 70–99)
Glucose-Capillary: 108 mg/dL — ABNORMAL HIGH (ref 70–99)
Glucose-Capillary: 78 mg/dL (ref 70–99)
Glucose-Capillary: 92 mg/dL (ref 70–99)
Glucose-Capillary: 93 mg/dL (ref 70–99)
Glucose-Capillary: 94 mg/dL (ref 70–99)
Glucose-Capillary: 94 mg/dL (ref 70–99)

## 2022-06-15 LAB — OSMOLALITY: Osmolality: 355 mOsm/kg (ref 275–295)

## 2022-06-15 MED ORDER — SORBITOL 70 % SOLN
960.0000 mL | TOPICAL_OIL | Freq: Once | ORAL | Status: AC
  Administered 2022-06-15: 960 mL via RECTAL
  Filled 2022-06-15: qty 240

## 2022-06-15 MED ORDER — KCL IN DEXTROSE-NACL 40-5-0.9 MEQ/L-%-% IV SOLN
INTRAVENOUS | Status: DC
  Filled 2022-06-15 (×3): qty 1000

## 2022-06-15 NOTE — Care Plan (Signed)
Apparently patients wife called hospital and gave her number. I was able to talk to her.  Apparently, she moved from old place to be near to his nursing home so she can walk there.  She tells me that she was sick and unable to move around since last month so she did not see him.  Updated her clinical status and poor recovery and end of life.  She will come to the hospital tomorrow morning and will talk to her about sending him back to nursing home with hospice care.  She understands the severity of his illness .

## 2022-06-15 NOTE — Progress Notes (Signed)
  Progress Note   Date: 06/15/2022  Patient Name: [name]        MRN#: [id number]  Clarification of the diagnosis of AKI:   AKI is clinically supported by the following: creatinine 1.71 corrected to 1.11 with hydration

## 2022-06-15 NOTE — Progress Notes (Signed)
PROGRESS NOTE    [name]  XKP:[id number] DOB: 30-Oct-1950 DOA: 06/13/2022 PCP: Jacelyn Grip, MD    Brief Narrative:   [name] is a 71 y.o. male with past medical history significant for schizophrenia, advanced dementia, CKD stage IIIa, history of ESBL E. coli, history of paroxysmal atrial fibrillation, history of DVT, chronic diastolic congestive heart failure who presented to [facility] ED on 06/13/2022 from [facility] SN[facility] with abnormal labs.  Nursing facility reported that his sodium was high but no printout of the lab report was given.  Given patient's advanced dementia, schizophrenia unable to participate in history; in which was obtained from chart review and ED provider report.  No family present at bedside; and spouse reportedly has not seen patient over the past 2 months and nursing home was looking for guardianship.  In the ED, temperature 101.1 F, HR 118, RR 21, BP 113/92, SPO2 98% on room air.  WBC 7.3, hemoglobin 11.8, platelet count 132.  Sodium 162, potassium 3.8, chloride 130, CO2 27, glucose 119, BUN 48, creatinine 1.71.  AST 53, ALT 49, total bilirubin 0.7.  Lipase 61, ammonia level 21.  Lactic acid 1.5.  CK9 75.  High sensitive troponin 41>45.  Urinalysis with moderate leukocytes, positive nitrite, many bacteria, greater than 50 WBCs.  Chest x-ray with no active cardiopulmonary disease process.  CT renal stone study with no renal calculus, mild fullness of the renal collecting system on the right with mild perinephric fat stranding bilaterally we may be infectious versus inflammatory, enlargement of the pancreatic head with suggestion of mild fat stranding possible pancreatitis.  Abdominal x-ray with large amount of stool throughout the colon.  Assessment & Plan:   Severe sepsis secondary to urinary tract infection Hx ESBL UTI Patient presenting to the ED from SNF and notably febrile 101.1 F, tachycardic, tachypneic with endorgan damage of acute renal failure.  Urinalysis  suggestive of urinary tract infection.  Previous history of ESBL E. coli UTI.  Foley catheter placed in the ED with purulence noted in collection bag. -- Blood cultures x2: Pending -- Urine culture: Pending -- Meropenem 1 g IV every 12 hours  Hypernatremia secondary to severe dehydration, no access to free water intake. Patient presenting with a sodium of 162, likely secondary to severe dehydration in the setting of poor oral intake, adult failure to thrive. -- Na 162>159>161> 152. -- Continue dextrose normal saline with potassium chloride. -- BMP in a.m.   Urinary retention Foley catheter placed in the ED. -- Continue monitor urinary output  Severe constipation Likely secondary to severe dehydration in the setting of advanced dementia/schizophrenia with poor mobility status.  Received milk of molasses enema in the ED. -- Unable to take oral medications. -We will use additional enema today.  Acute renal failure on CKD stage IIIa Etiology likely secondary to severe dehydration in the setting of adult failure to thrive, poor oral intake. --Cr 1.71>1.45>1.37>1.1 --Continue IV fluid hydration --Avoid nephrotoxins, renal dose all medications --BMP as above  Schizophrenia Advanced dementia -- Continue Cogentin 1 mg p.o. nightly when he is able to take it. -- SLP for swallow evaluation , currently unsafe to eat.  History of paroxysmal atrial fibrillation History of DVT Does not appear to be on chronic anticoagulation outpatient.  EKG on admission normal sinus rhythm. -- Monitor on telemetry  Adult failure to thrive/social Patient presenting from SNF with severe dehydration, severe sepsis in the setting of advanced dementia/schizophrenia and appears to be bedbound.  Overall very poor  prognosis.  Spouse is apparently not seen patients in roughly 2 months. --Palliative care consulted for assistance with goals of care medical decision making. -Social worker activated to look for a  spouse, apparently nursing home was in the process of looking for guardianship. Patient is appropriate for hospice level of care.  Sacral decubitus ulcer, POA Pressure Injury 06/13/22 Sacrum Posterior Stage 3 -  Full thickness tissue loss. Subcutaneous fat may be visible but bone, tendon or muscle are NOT exposed. Pt has a quarter size decubitus on the sacrum that is covered by a duoderm (Active)  06/13/22 2000  Location: Sacrum  Location Orientation: Posterior  Staging: Stage 3 -  Full thickness tissue loss. Subcutaneous fat may be visible but bone, tendon or muscle are NOT exposed.  Wound Description (Comments): Pt has a quarter size decubitus on the sacrum that is covered by a duoderm  Present on Admission: Yes  -- Wound care following.  Goal of care: Unable to reach family.  Nursing home report indicates patient full code. Severe systemic disease, advanced dementia and now unable to eat.  Any attempt at CPR is going to be futile.  We will change to DNR with two-physician agreement. Continue all supportive care.  He is appropriate for hospice level of care.    DVT prophylaxis: SCDs Start: 06/14/22 0545    Code Status: Full Code, will change to DNR. Family Communication: H.  Disposition Plan:  Level of care: Telemetry Status is: Inpatient Remains inpatient appropriate because: IV antibiotics, IV fluids, awaiting palliative care consultation, overall very poor prognosis given his comorbidities and adult failure to thrive    Consultants:  Palliative care  Procedures:  None  Antimicrobials:  Meropenem 10/28>>   Subjective: Patient seen and examined.  He responds to loud voice and utters few incomprehensible words.  Unable to follow any commands.  No family available.  No other overnight events.  Unable to follow commands or swallow any food.  Objective: Vitals:   06/15/22 0400 06/15/22 0500 06/15/22 0600 06/15/22 0800  BP: 107/63 (!) 106/58 119/67   Pulse: 73 74 84    Resp: '16 16 13   '$ Temp:    (!) 97.3 F (36.3 C)  TempSrc:    Axillary  SpO2: 100% 100% 99%   Weight:      Height:        Intake/Output Summary (Last 24 hours) at 06/15/2022 1117 Last data filed at 06/15/2022 0700 Gross per 24 hour  Intake 2066.11 ml  Output 1150 ml  Net 916.11 ml    Filed Weights   06/13/22 2037 06/14/22 0900  Weight: 61.2 kg 55.2 kg    Examination:  General: Sick looking.  Frail debilitated.  Unable to communicate. Cardiovascular: S1 and S2 normal.  Regular rate rhythm. Respiratory: Bilateral clear.  Conducted upper airway sounds. Gastrointestinal: Soft and nontender. Ext: Contracted extremities.  Multiple pressure ulcers as described below. Neuro: Alert to stimulation, not oriented. Musculoskeletal: Contracted all 4 extremities. Catheters: Foley catheter with clear urine.      Data Reviewed: I have personally reviewed following labs and imaging studies  CBC: Recent Labs  Lab 06/13/22 2105 06/14/22 0927  WBC 7.3 7.2  NEUTROABS 5.8  --   HGB 11.8* 10.1*  HCT 39.0 33.4*  MCV 94.4 95.4  PLT 132* 120*    Basic Metabolic Panel: Recent Labs  Lab 06/13/22 2350 06/14/22 0200 06/14/22 0927 06/14/22 1508 06/14/22 1826 06/14/22 2003 06/15/22 0301 06/15/22 0901  NA  --    < >  --  150* 159* 158* 155* 152*  K  --    < >  --  3.4* 4.9 3.4* 3.7 3.2*  CL  --    < >  --  120* 130* 127* 126* 123*  CO2  --    < >  --  '25 24 26 25 25  '$ GLUCOSE  --    < >  --  499* 136* 141* 128* 120*  BUN  --    < >  --  38* 40* 40* 36* 35*  CREATININE  --    < >  --  1.21 1.23 1.16 1.17 1.11  CALCIUM  --    < >  --  7.5* 8.1* 8.3* 8.0* 7.9*  MG 2.6*  --  2.5*  --   --   --   --   --   PHOS 3.5  --  3.2  --   --   --   --   --    < > = values in this interval not displayed.    GFR: Estimated Creatinine Clearance: 48.3 mL/min (by C-G formula based on SCr of 1.11 mg/dL). Liver Function Tests: Recent Labs  Lab 06/13/22 2105  AST 53*  ALT 49*  ALKPHOS 87   BILITOT 0.7  PROT 7.1  ALBUMIN 2.6*    Recent Labs  Lab 06/14/22 0200  LIPASE 61*    Recent Labs  Lab 06/13/22 2350  AMMONIA 21    Coagulation Profile: Recent Labs  Lab 06/13/22 2350  INR 1.2    Cardiac Enzymes: Recent Labs  Lab 06/13/22 2350 06/14/22 0511  CKTOTAL 975* 968*    BNP (last 3 results) No results for input(s): "PROBNP" in the last 8760 hours. HbA1C: Recent Labs    06/14/22 0927  HGBA1C 5.9*   CBG: Recent Labs  Lab 06/14/22 1741 06/14/22 2001 06/14/22 2323 06/15/22 0324 06/15/22 0746  GLUCAP 123* 127* 115* 108* 94    Lipid Profile: No results for input(s): "CHOL", "HDL", "LDLCALC", "TRIG", "CHOLHDL", "LDLDIRECT" in the last 72 hours. Thyroid Function Tests: Recent Labs    06/14/22 0511  TSH 2.310    Anemia Panel: No results for input(s): "VITAMINB12", "FOLATE", "FERRITIN", "TIBC", "IRON", "RETICCTPCT" in the last 72 hours. Sepsis Labs: Recent Labs  Lab 06/13/22 2350 06/14/22 0206  LATICACIDVEN 1.1 1.5     Recent Results (from the past 240 hour(s))  Culture, blood (Routine X 2) w Reflex to ID Panel     Status: None (Preliminary result)   Collection Time: 06/14/22  1:50 AM   Specimen: Right Antecubital; Blood  Result Value Ref Range Status   Specimen Description   Final    RIGHT ANTECUBITAL BLOOD Performed at [facility], [place] [address], [place], [place] [address]    Special Requests   Final    BOTTLES DRAWN AEROBIC AND ANAEROBIC Blood Culture adequate volume Performed at [facility] [address], [place], [place] [address]    Culture   Final    NO GROWTH 1 DAY Performed at [facility], [place] [address] [place],  [address]    Report Status PENDING  Incomplete  Culture, blood (Routine X 2) w Reflex to ID Panel     Status: None (Preliminary result)   Collection Time: 06/14/22  4:05 AM   Specimen: BLOOD RIGHT FOREARM  Result Value Ref Range Status   Specimen Description    Final    BLOOD RIGHT FOREARM Performed at [facility], [place] [address], [place], [place] [address]    Special Requests   Final    BOTTLES DRAWN AEROBIC AND ANAEROBIC Blood Culture adequate volume Performed at [facility] [address] [place], [place] [address]    Culture   Final    NO GROWTH < 24 HOURS Performed at [facility] [address], [place], [place] [address]    Report Status PENDING  Incomplete  MRSA Next Gen by PCR, Nasal     Status: None   Collection Time: 06/14/22  9:15 AM   Specimen: Nasal Mucosa; Nasal Swab  Result Value Ref Range Status   MRSA by PCR Next Gen NOT DETECTED NOT DETECTED Final    Comment: (NOTE) The GeneXpert MRSA Assay (FDA approved for NASAL specimens only), is one component of a comprehensive MRSA colonization surveillance program. It is not intended to diagnose MRSA infection nor to guide or monitor treatment for MRSA infections. Test performance is not FDA approved in patients less than 10 years old. Performed at [facility], [place] [address], [place], [place] [address]          Radiology Studies: ECHOCARDIOGRAM COMPLETE  Result Date: 06/14/2022    ECHOCARDIOGRAM REPORT   Patient Name:   LARRON ARMOR Date of Exam: 06/14/2022 Medical Rec #:  [id number]     Height:       69.0 in Accession #:    [id number]    Weight:       135.0 lb Date of Birth:  02-01-1951      BSA:          1.748 m Patient Age:    47 years      BP:           99/85 mmHg Patient Gender: M             HR:           94 bpm. Exam Location:  Inpatient Procedure: 2D Echo, Cardiac Doppler and Color Doppler Indications:    Elevated Troponin  History:        Patient has prior history of Echocardiogram examinations, most                 recent 04/24/2020. Risk Factors:Hypertension and GERD.  Sonographer:    Bernadene Person RDCS Referring Phys: Geryl Rankins DOUTOVA  Sonographer Comments: Image acquisition challenging due  to uncooperative patient. IMPRESSIONS  1. Abnormal septal motion. Left ventricular ejection fraction, by estimation, is 55%. The left ventricle has normal function. The left ventricle has no regional wall motion abnormalities. Left ventricular diastolic parameters were normal.  2. Right ventricular systolic function is normal. The right ventricular size is normal.  3. The mitral valve is abnormal. No evidence of mitral valve regurgitation. No evidence of mitral stenosis.  4. The aortic valve is tricuspid. There is mild calcification of the aortic valve. There is mild thickening of the aortic valve. Aortic valve regurgitation is trivial. Aortic valve sclerosis is present, with no evidence of aortic valve stenosis.  5. The inferior vena cava is normal in size with greater than 50% respiratory variability, suggesting right atrial pressure of 3 mmHg. FINDINGS  Left Ventricle: Abnormal septal motion. Left ventricular ejection fraction, by estimation, is 55%. The left ventricle has normal function. The left ventricle has no regional wall motion abnormalities. The left ventricular internal cavity size was normal  in size. There is no left ventricular hypertrophy. Left ventricular diastolic parameters  were normal. Right Ventricle: The right ventricular size is normal. No increase in right ventricular wall thickness. Right ventricular systolic function is normal. Left Atrium: Left atrial size was normal in size. Right Atrium: Right atrial size was normal in size. Pericardium: Trivial pericardial effusion is present. The pericardial effusion is localized near the right ventricle. Mitral Valve: The mitral valve is abnormal. There is mild thickening of the mitral valve leaflet(s). There is mild calcification of the mitral valve leaflet(s). No evidence of mitral valve regurgitation. No evidence of mitral valve stenosis. Tricuspid Valve: The tricuspid valve is normal in structure. Tricuspid valve regurgitation is not  demonstrated. No evidence of tricuspid stenosis. Aortic Valve: The aortic valve is tricuspid. There is mild calcification of the aortic valve. There is mild thickening of the aortic valve. Aortic valve regurgitation is trivial. Aortic valve sclerosis is present, with no evidence of aortic valve stenosis. Pulmonic Valve: The pulmonic valve was normal in structure. Pulmonic valve regurgitation is not visualized. No evidence of pulmonic stenosis. Aorta: The aortic root is normal in size and structure. Venous: The inferior vena cava is normal in size with greater than 50% respiratory variability, suggesting right atrial pressure of 3 mmHg. IAS/Shunts: The interatrial septum was not well visualized.  LEFT VENTRICLE PLAX 2D LVIDd:         4.40 cm   Diastology LVIDs:         3.10 cm   LV e' medial:    8.16 cm/s LV PW:         0.80 cm   LV E/e' medial:  6.3 LV IVS:        1.00 cm   LV e' lateral:   7.38 cm/s LVOT diam:     2.10 cm   LV E/e' lateral: 7.0 LV SV:         46 LV SV Index:   26 LVOT Area:     3.46 cm  RIGHT VENTRICLE RV S prime:     18.20 cm/s TAPSE (M-mode): 1.6 cm LEFT ATRIUM             Index        RIGHT ATRIUM          Index LA diam:        2.70 cm 1.54 cm/m   RA Area:     6.71 cm LA Vol (A2C):   22.7 ml 12.98 ml/m  RA Volume:   10.60 ml 6.06 ml/m LA Vol (A4C):   20.8 ml 11.90 ml/m LA Biplane Vol: 21.9 ml 12.53 ml/m  AORTIC VALVE LVOT Vmax:   93.00 cm/s LVOT Vmean:  57.000 cm/s LVOT VTI:    0.133 m  AORTA Ao Root diam: 3.40 cm MITRAL VALVE MV Area (PHT): 3.12 cm    SHUNTS MV Decel Time: 243 msec    Systemic VTI:  0.13 m MV E velocity: 51.40 cm/s  Systemic Diam: 2.10 cm MV A velocity: 53.70 cm/s MV E/A ratio:  0.96 Jenkins Rouge MD Electronically signed by Jenkins Rouge MD Signature Date/Time: 06/14/2022/12:25:29 PM    Final    DG Abd 1 View  Result Date: 06/14/2022 CLINICAL DATA:  Constipation EXAM: ABDOMEN - 1 VIEW COMPARISON:  June 13, 2022 FINDINGS: The study is limited due to positioning.  Moderate fecal loading throughout the colon again identified. The fecal loading in the descending colon persists but is less prominent compared to yesterday's study. Lung bases are unremarkable. No other bony or soft tissue abnormalities are identified. IMPRESSION:  Moderate fecal loading throughout the colon. The fecal loading in the descending colon is less prominent compared to yesterday's study. Electronically Signed   By: Dorise Bullion III M.D.   On: 06/14/2022 08:46   CT RENAL STONE STUDY  Result Date: 06/14/2022 CLINICAL DATA:  Neurogenic bladder dysfunction. EXAM: CT ABDOMEN AND PELVIS WITHOUT CONTRAST TECHNIQUE: Multidetector CT imaging of the abdomen and pelvis was performed following the standard protocol without IV contrast. RADIATION DOSE REDUCTION: This exam was performed according to the departmental dose-optimization program which includes automated exposure control, adjustment of the mA and/or kV according to patient size and/or use of iterative reconstruction technique. COMPARISON:  10/30/2020, 08/20/2016. FINDINGS: Lower chest: Mild atelectasis at the right lung base. Hepatobiliary: A stable subcentimeter hypodensity is present in the posterior right lobe of the liver which is too small to further characterize. No biliary ductal dilatation. The gallbladder is not visualized on exam. Pancreas: There is enlargement of the pancreatic head with surrounding fat stranding, possible pancreatitis. No pancreatic ductal dilatation. Spleen: Normal in size without focal abnormality. Adrenals/Urinary Tract: The adrenal glands are within normal limits. No renal calculus bilaterally. There is mild fullness of the right renal pelvis and calices with no definite evidence of obstructive uropathy. The bladder is distended and otherwise within normal limits. Stomach/Bowel: Stomach is within normal limits. Appendix appears normal. No evidence of bowel wall thickening, distention, or inflammatory changes. No free  air or pneumatosis. A moderate amount of retained stool is present in the colon. Vascular/Lymphatic: Aortic atherosclerosis. No enlarged abdominal or pelvic lymph nodes. Reproductive: The prostate gland is prominent in size. Other: No abdominopelvic ascites. Musculoskeletal: No acute osseous abnormality. IMPRESSION: 1. No renal calculus. There is mild fullness of the renal collecting system on the right with mild perinephric fat stranding bilaterally which may be infectious or inflammatory. 2. Enlargement of the pancreatic head with suggestion of mild fat stranding, possible pancreatitis. Correlation with amylase and lipase is recommended. 3. Aortic atherosclerosis. Electronically Signed   By: Brett Fairy M.D.   On: 06/14/2022 01:24   DG Abd 1 View  Result Date: 06/13/2022 CLINICAL DATA:  Hypernatremia. EXAM: ABDOMEN - 1 VIEW COMPARISON:  None Available. FINDINGS: The bowel gas pattern is normal. There is a large amount of stool throughout the colon. No radio-opaque calculi seen overlying the kidneys. There are vascular calcifications in the pelvis. No acute fractures are identified. IMPRESSION: Large amount of stool throughout the colon. No evidence of bowel obstruction. Electronically Signed   By: Ronney Asters M.D.   On: 06/13/2022 23:59   DG CHEST PORT 1 VIEW  Result Date: 06/13/2022 CLINICAL DATA:  Tachycardia EXAM: PORTABLE CHEST 1 VIEW COMPARISON:  Chest x-ray 01/03/2021 FINDINGS: The heart size and mediastinal contours are within normal limits. Both lungs are clear. The visualized skeletal structures are unremarkable. IMPRESSION: No active disease. Electronically Signed   By: Ronney Asters M.D.   On: 06/13/2022 23:57        Scheduled Meds:  benztropine  1 mg Oral QHS   Chlorhexidine Gluconate Cloth  6 each Topical Daily   insulin aspart  0-6 Units Subcutaneous Q4H   leptospermum manuka honey  1 Application Topical Daily   mouth rinse  15 mL Mouth Rinse 4 times per day   senna-docusate   2 tablet Oral BID   thiamine (VITAMIN B1) injection  100 mg Intravenous Daily   Continuous Infusions:  dextrose 5 % and 0.9 % NaCl with KCl 40 mEq/L  meropenem (MERREM) IV Stopped (06/15/22 0004)     LOS: 2 days    Total time spent: 50 minutes   Eulon Allnutt,MD [facility] Available via Epic secure chat 7am-7pm After these hours, please refer to coverage provider listed on [email or web address] 06/15/2022, 11:17 AM

## 2022-06-15 NOTE — Progress Notes (Signed)
Speech Language Pathology Treatment: Dysphagia  Patient Details Name: [name] MRN: [id number] DOB: October 13, 1950 Today's Date: 06/15/2022 Time: 4098-1191 SLP Time Calculation (min) (ACUTE ONLY): 25 min  Assessment / Plan / Recommendation Clinical Impression  Patient seen by SLP for skilled treatment focused on dysphagia goals. Patient had eyes closed and appeared to be asleep when SLP entered room but he awakened easily to voice and maintained alertness throughout session. SLP performed oral care via Yankauer and toothette sponge suctioning and sticky secretions removed from oral cavity. SLP observed some sticky, dried secretions adhered to soft palate but unable to remove secondary to patient not tolerating/gagging. After oral care, SLP assessed patient's toleration with small ice chips, spoon sips water and small bites of gelatin. He exhibited delayed mastication, decreased bolus cohesion and pocketing of ice chip and gelatin in right side oral cavity. With thin liquids via spoon sips, he exhibited suspected swallow initiation delay as well as suspected decreased pharyngeal contraction and laryngeal elevation. SLP is recommending continue NPO status but allow for PRN spoon sips of water and ice chips with full supervision and after oral care. SLP will continue to follow for PO readiness trials.    HPI HPI: [name] is a 71 y.o. male who presented to the ED secondary to abnormal labs. [name] found to have severe sepsis secondary to UTI in setting of history of ESBL and urinary retention, hypernatremia secondary to severe dehydration, severe constipation, and AKI on CKD, encephalopathy, and per referring MD's note, [name] not eating/drinking. Palliative care has been consulted. PMH: schizophrenia, dementia, chronic kidney disease stage IIIa, recent history of ESBL UTI , a.fib, DVT      SLP Plan  Continue with current plan of care      Recommendations for follow up therapy are one component of a  multi-disciplinary discharge planning process, led by the attending physician.  Recommendations may be updated based on patient status, additional functional criteria and insurance authorization.    Recommendations  Diet recommendations: Other(comment) (PRN ice chips/spoon sips water after oral care) Liquids provided via: Cup Medication Administration: Via alternative means                Oral Care Recommendations: Oral care QID;Oral care prior to ice chip/H20 Follow Up Recommendations: Skilled nursing-short term rehab (<3 hours/day) Assistance recommended at discharge: Frequent or constant Supervision/Assistance SLP Visit Diagnosis: Dysphagia, unspecified (R13.10) Plan: Continue with current plan of care           Sonia Baller, MA, CCC-SLP Speech Therapy

## 2022-06-15 NOTE — Progress Notes (Addendum)
Initial Nutrition Assessment  DOCUMENTATION CODES:  Non-severe (moderate) malnutrition in context of chronic illness  INTERVENTION:  -Initiate '100mg'$  thiamine and '1mg'$  folic acid x 7 days, and daily MVI for refeeding risk -If consistent with POC, place NGT for nutrition -Recommend initiating Jevity 1.5 @ 54m/hr w/ 65mProsource TF20 q day.  -As tolerated, increase EN by 1046m 24hrs to goal rate of 33m91m x 24hrs w/ 60mL80msource TF20 q day to provide 1880kcal (34kcal/kg), 97g protein (1.76g/kg), and 912mL 79m water.  -Monitor labs q 12-24hrs and replete lytes prn -If po diet to resume, recommend Ensure Plus HP TID to provide 350kcal, and 20g protein/bottle  NUTRITION DIAGNOSIS:  Moderate Malnutrition related to chronic illness (dementia) as evidenced by moderate fat depletion, moderate muscle depletion.  GOAL:  Patient will meet greater than or equal to 90% of their needs  MONITOR:  Other (Comment) (initiation of nutrition by hospital day 5 if aggressive treatment desired)  REASON FOR ASSESSMENT:  Consult Assessment of nutrition requirement/status  ASSESSMENT:  Pt is a 71yo M46yoh PMH of paranoid schizophrenia, advanced dementia, CKD 3a, a fib, DVT, diastolic CHF, HLD, Hepatitis C, and GERD who presents with sepsis due to UTI, constipation and AKI due to dehydration. Pt nonverbal during admission.  Pt seen by SLP on 10/30 and recommended NPO with alternative means of nutrition. Visited pt at bedside to complete NFPE which shows moderate to severe fat loss and moderate to severe muscle wasting. No po intake history or recent weight history available. Pt meets ASPEN criteria for moderate protein calorie malnutrition r/t chronic illness based on NFPE.   Pt with stage 3 PI and 2 stage 2 PIs on sacrum.   POC goals being determined. Pt remains lethargic and altered. He did not wake during assessment. If aggressive treatment desired, recommend NGT placement for EN. Recommended  supplementing thiamine, folic acid and MVI to reduce effects of refeeding (risk of refeeding due to malnutrition). Recommend initiating Jevity 1.5 @ 20mL/h24m 60mL Pr71mrce TF20 q day. As tolerated, increase EN by 10mL q 285m to goal rate 33mL/hr x61mrs w/ 60mL Proso51m TF20 q day to provide 1880kcal (34kcal/kg), 97g protein (1.76g/kg), and 912mL free w17m. Please check electrolytes q 12-24hrs and replete lytes prn.  If po diet can resume, recommend ONS TID to increase protein and calorie intake suspected limited volume.  Medications reviewed and include: novolog, senna-docusate, thiamine, D5W '@125mL'$ /hr, prn dulcolax suppository, prn haldol, prn oxycodone, prn miralax/glycolax  Labs reviewed: Na:155, K:3.7, BG:94-128, BUN:36, Cr:1.17, GFR>60, A1c:5.9  Weight History: 06/14/22 55.2 kg  01/11/21 65 kg  11/28/20 64.8 kg  11/09/20 65.8 kg  10/28/20 64 kg  07/07/20 64.3 kg  06/15/20 64 kg  Per EMR weight history 15% unintended weight loss in the last 1.5 years  NUTRITION - FOCUSED PHYSICAL EXAM:  Flowsheet Row Most Recent Value  Orbital Region Moderate depletion  Upper Arm Region Severe depletion  Thoracic and Lumbar Region Moderate depletion  Buccal Region Moderate depletion  Temple Region Moderate depletion  Clavicle Bone Region Moderate depletion  Clavicle and Acromion Bone Region Severe depletion  Scapular Bone Region Unable to assess  Dorsal Hand Moderate depletion  Patellar Region Moderate depletion  Anterior Thigh Region Moderate depletion  Posterior Calf Region Moderate depletion  Hair Reviewed  Eyes Unable to assess  Mouth Unable to assess  Skin Reviewed  Nails Reviewed       Diet Order:   Diet Order  Diet NPO time specified Except for: Ice Chips, Other (See Comments)  Diet effective now                   EDUCATION NEEDS:   Not appropriate for education at this time  Skin:  Skin Assessment: Skin Integrity Issues: Skin Integrity Issues::  Stage II, Stage III Stage II: per WOCN 2 stage 2 PI on sacrum Stage III: on sacrum  Last BM:  PTA  Height:  Ht Readings from Last 1 Encounters:  06/14/22 '5\' 9"'$  (1.753 m)   Weight:  Wt Readings from Last 1 Encounters:  06/14/22 55.2 kg   Ideal Body Weight:  72.7 kg  BMI:  Body mass index is 17.97 kg/m.  Estimated Nutritional Needs:  Kcal:  1655-1935kcal (30-35kcal/kg for wound healing and weight gain) Protein:  80-110g (1.5-2.0g/kg for wound healing and underweight) Fluid:  1655-1960m/day  KCandise Bowens MS, RD, LDN, CNSC See AMiON for contact information

## 2022-06-15 NOTE — Progress Notes (Signed)
Daily Progress Note   Patient Name: [name]       Date: 06/15/2022 DOB: 1951-04-09  Age: 71 y.o. MRN#: [id number] Attending Physician: Barb Merino, MD Primary Care Physician: Jacelyn Grip, MD Admit Date: 06/13/2022 Length of Stay: 2 days  Reason for Consultation/Follow-up: Establishing goals of care  Subjective:   CC:.  Patient unable to participate in complex medical decision making due to his underlying medical status.  Following up regarding establishing goals of care.  Subjective:  Discussed care with bedside RN and hospitalist today.  Hospitalist was able to speak with nursing facility yesterday to discuss cares for patient moving forward.  As per report, spouse has not seen the patient in over 2 months and nursing home is looking for guardianship for patient.  No family at bedside during visit.  Patient remains lethargic.  Minimally responsive.  Non verbal. No nonverbal signs of pain present during visit.  Review of Systems  Objective:   Vital Signs:  BP 119/67   Pulse 84   Temp (!) 97.3 F (36.3 C) (Axillary)   Resp 13   Ht '5\' 9"'$  (1.753 m)   Wt 55.2 kg   SpO2 99%   BMI 17.97 kg/m   Physical Exam: General: lethargic, cachetic, frail, contracted limbs Eyes: no drainage noted HENT: dry mucous membranes with dried caking and poor dentition Cardiovascular: RRR Respiratory: no increased work of breathing noted, not in respiratory distress Abdomen: not distended Extremities: no edema in LE b/l Skin: lethargic, nonverbal   Assessment & Plan:   Assessment: Patient is a 71 year old male with past medical history of schizophrenia, advanced dementia, CKD stage III, history of ESBL, history of paroxysmal A-fib, history of DVT, and HFpEF who was admitted on 06/13/2022 from [facility] SNF for abnormal lab work.  During admission, patient receiving work-up and support for severe sepsis secondary to urinary tract infection in setting of history of ESBL and urinary  retention, per natremia secondary to severe dehydration, severe constipation, and AKI on CKD.  Palliative care consulted to assist with complex medical decision making.   Recommendations/Plan: # Complex medical decision making/goals of care:     -Patient unable to participate in complex medical decision making due to his underlying medical status.                -As per discussions, nursing home was seeking guardianship for patient as spouse had not seen patient in roughly 2 months.  Multiple people have been unable to reach patient's wife who is only contact listed in chart.                -  Code Status: DNR/DNI   -Patient is at the end of his life in the setting of severe systemic disease with end-stage dementia,  inability to eat/drink, incontinence, inability to communicate, multiple sites of skin failure, and bedbound status, any attempt at CPR/intubation would be ineffective and/or harmful to the patient in the setting that there is no reasonable likelihood that it would achieve meaningful medical benefit for the patient. Code status has been appropriately changed to DNR/DNI based on two-physician agreement.  Prognosis: days to weeks   -needs hospice care  # Symptom management:   -Constipation                               -Agree with aggressive symptom management for constipation including enema and suppository use as needed.                  -  Pain, patient likely having underlying pain in setting of contractures with multiple skin wounds                               -Continue oxycodone solution 2.5-'5mg'$  q4hrs prn. which can be absorbed buccally.   # Psycho-social/Spiritual Support:  - Support System: Unknown -Discussed care with chaplain   # Discharge Planning: Likely return to long term care facility; hopefully with hospice support for end of life care  Discussed with: Bedside RN, hospitalist  Thank you for allowing the palliative care team to participate in the care [name].  Corey Aus, DO Palliative Care Provider PMT # [phone]  If patient remains symptomatic despite maximum doses, please call PMT at [phone] between 0700 and 1900. Outside of these hours, please call attending, as PMT does not have night coverage.

## 2022-06-15 NOTE — Progress Notes (Signed)
OT Cancellation Note and Discharge  Patient Details Name: OAKLEE SUNGA MRN: [id number] DOB: July 03, 1951   Cancelled Treatment:    Reason Eval/Treat Not Completed: OT called and spoke with Mendel Corning and they state that Pt is total care at baseline. He does not complete ADL/IADL and is bed bound at baseline. Per LCSW note: family has not seen patient since June, SNF had a meeting last week and are pursuing DSS guardianship for patient. They are considering possible hospice at facility. No needs identified. OT will sign off at this time.   Merri Ray Tammy Wickliffe 06/15/2022, 9:18 AM  [place] Office: [phone]

## 2022-06-15 NOTE — NC FL2 (Signed)
[place] LEVEL OF CARE SCREENING TOOL     IDENTIFICATION  Patient Name: [name] Birthdate: 08-28-1950 Sex: male Admission Date (Current Location): 06/13/2022  [facility] and [place] Number:  Herbalist and Address:  [facility],  [place] [address], [place]      Provider Number: [id number]  Attending Physician Name and Address:  Barb Merino, MD  Relative Name and Phone Number:  Deontrey, Massi [phone]    Current Level of Care: Hospital Recommended Level of Care: Other (Comment), [place] (LTC) Prior Approval Number:    Date Approved/Denied:   PASRR Number: [id number]  Discharge Plan: Other (Comment) (LTC)    Current Diagnoses: Patient Active Problem List   Diagnosis Date Noted   Severe dementia ([place]) 06/15/2022   Palliative care encounter 06/15/2022   Paroxysmal atrial fibrillation ([place]) 06/14/2022   Constipation 06/14/2022   Elevated troponin 06/14/2022   Pressure injury of skin 06/14/2022   Goals of care, counseling/discussion    Counseling and coordination of care    Pain    Hypernatremia 06/13/2022   Severely aggressive behavior    Acute UTI 11/27/2020   Chronic kidney disease, stage 3a ([place]) 11/27/2020   Thrombocytopenia ([place]) 11/27/2020   Acute cystitis without hematuria    Closed fracture of bone of right foot    Altered mental status 07/07/2020   Insomnia 06/21/2020   Anxiety 06/18/2020   Acute encephalopathy    Cognitive impairment 06/12/2020   AMS (altered mental status) 06/05/2020   H/O noncompliance with medical treatment, presenting hazards to health    DVT (deep venous thrombosis) ([place]) 06/04/2020   Schizophrenia ([place])    Homeless    AKI (acute kidney injury) ([place]) 05/12/2020   Dislocation of tarsometatarsal joint    History of hepatitis B 04/26/2020   Chronic hepatitis ([place])    Dementia with behavioral disturbance ([place])    Atrial fibrillation with RVR ([place])  04/24/2020   Malnutrition of moderate degree 04/24/2020   History of drug abuse ([place]) 10/18/2017   Tobacco abuse    Diastolic heart failure ([place]) 02/05/2015   Healthcare maintenance 05/28/2014   Hyperlipidemia 03/10/2014   BRANCH RETINAL VEIN OCCLUSION 06/03/2010   PARANOID SCHIZOPHRENIA, CHRONIC 01/17/2009   Hypertension 01/17/2009   BPH (benign prostatic hyperplasia) 01/17/2009   History of hepatitis C 12/14/2008   DEPRESSION 12/14/2008    Orientation RESPIRATION BLADDER Height & Weight        Normal Incontinent, External catheter Weight: 121 lb 11.1 oz (55.2 kg) Height:  '5\' 9"'$  (175.3 cm)  BEHAVIORAL SYMPTOMS/MOOD NEUROLOGICAL BOWEL NUTRITION STATUS      Incontinent Diet (Regular)  AMBULATORY STATUS COMMUNICATION OF NEEDS Skin   Total Care Does not communicate PU Stage and Appropriate Care     PU Stage 3 Dressing: Daily                 Personal Care Assistance Level of Assistance  Bathing, Feeding, Dressing, Total care Bathing Assistance: Maximum assistance Feeding assistance: Maximum assistance Dressing Assistance: Maximum assistance Total Care Assistance: Maximum assistance   Functional Limitations Info  Sight, Hearing, Speech Sight Info: Impaired Hearing Info: Impaired Speech Info: Impaired    SPECIAL CARE FACTORS FREQUENCY  PT (By licensed PT), OT (By licensed OT)     PT Frequency: 5x/wk OT Frequency: 5x/wk            Contractures Contractures Info: Not present    Additional Factors Info  Code Status, Allergies Code  Status Info: DNR Allergies Info: Lisinopril, Penicillins, Amoxicillin, Ibuprofen           Current Medications (06/15/2022):  This is the current hospital active medication list Current Facility-Administered Medications  Medication Dose Route Frequency Provider Last Rate Last Admin   acetaminophen (TYLENOL) tablet 650 mg  650 mg Oral Q6H PRN Toy Baker, MD       Or   acetaminophen (TYLENOL) suppository 650 mg  650 mg  Rectal Q6H PRN Doutova, Anastassia, MD       benztropine (COGENTIN) tablet 1 mg  1 mg Oral QHS [place] Eric J, DO       bisacodyl (DULCOLAX) suppository 10 mg  10 mg Rectal Daily PRN Toy Baker, MD       Chlorhexidine Gluconate Cloth 2 % PADS 6 each  6 each Topical Daily [place] Donnamarie Poag, DO   6 each at 06/14/22 0900   dextrose 5 % and 0.9 % NaCl with KCl 40 mEq/L infusion   Intravenous Continuous Barb Merino, MD 75 mL/hr at 06/15/22 1318 New Bag at 06/15/22 1318   haloperidol lactate (HALDOL) injection 2 mg  2 mg Intravenous Q6H PRN [place] Eric J, DO       insulin aspart (novoLOG) injection 0-6 Units  0-6 Units Subcutaneous Q4H [place] Eric J, DO       leptospermum manuka honey (MEDIHONEY) paste 1 Application  1 Application Topical Daily [place] Donnamarie Poag, DO   1 Application at 16/10/96 1319   meropenem (MERREM) 1 g in sodium chloride 0.9 % 100 mL IVPB  1 g Intravenous Q12H Toy Baker, MD   Stopped at 06/15/22 0004   Oral care mouth rinse  15 mL Mouth Rinse 4 times per day [place] Eric J, DO   15 mL at 06/15/22 1319   Oral care mouth rinse  15 mL Mouth Rinse PRN [place] Eric J, DO       oxyCODONE (ROXICODONE) 5 MG/5ML solution 2.5-5 mg  2.5-5 mg Oral Q4H PRN Mims, Lauren W, DO       polyethylene glycol (MIRALAX / GLYCOLAX) packet 17 g  17 g Oral Daily PRN [place] Eric J, DO       senna-docusate (Senokot-S) tablet 2 tablet  2 tablet Oral BID [place] Eric J, DO       sorbitol, milk of mag, mineral oil, glycerin (SMOG) enema  960 mL Rectal Once Barb Merino, MD       thiamine (VITAMIN B1) injection 100 mg  100 mg Intravenous Daily Doutova, Anastassia, MD   100 mg at 06/15/22 1011     Discharge Medications: Please see discharge summary for a list of discharge medications.  Relevant Imaging Results:  Relevant Lab Results:   Additional Information SSN: [id number]  Vassie Moselle, LCSW

## 2022-06-15 NOTE — Progress Notes (Signed)
PT Cancellation Note  Patient Details Name: [name] MRN: [id number] DOB: 14-Apr-1951   Cancelled Treatment:    Reason Eval/Treat Not Completed: PT screened, no needs identified, will sign off (pt requires total care at baseline at his SNF, pt is at baseline, will sign off.)   Philomena Doheny PT 06/15/2022  [place]  Office [phone]

## 2022-06-15 NOTE — Progress Notes (Signed)
  Progress Note   Date: 06/15/2022  Patient Name: [name]        MRN#: [id number]  Clarification of diagnosis:  CKD stage 3a as progress note states

## 2022-06-15 NOTE — TOC Progression Note (Signed)
Transition of Care [facility]) - Progression Note    Patient Details  Name: [name] MRN: [id number] Date of Birth: 31-Mar-1951  Transition of Care [facility]) CM/SW [place], [place] Phone Number: 06/15/2022, 1:52 PM  Clinical Narrative:    Current plan is for pt to return to [place] with Hospice services. CSW spoke with Nevin Bloodgood at [facility] who confirmed pt is able to return at discharge and they will be able to accept him if discharged on 10/31. Nevin Bloodgood also confirms plan for hospice services and has requested a referral be sent to [facility]. [place]-2 has been completed and sent to [facility] for review.   A referral has been made to [place] at [facility]. DC summary and hospice orders will need to be faxed to: [phone].    Expected Discharge Plan: Assisted Living Barriers to Discharge: No Barriers Identified  Expected Discharge Plan and Services Expected Discharge Plan: Assisted Living In-house Referral: Hospice / Palliative Care Discharge Planning Services: CM Consult Post Acute Care Choice: Resumption of Svcs/PTA Provider, Hospice Living arrangements for the past 2 months: May                 DME Arranged: N/A DME Agency: NA                   Social Determinants of Health (SDOH) Interventions    Readmission Risk Interventions    06/15/2022    1:50 PM 11/28/2020   12:21 PM  Readmission Risk Prevention Plan  Transportation Screening Complete Complete  PCP or Specialist Appt within 5-7 Days Complete   Home Care Screening Complete   Medication Review (RN CM) Complete   Medication Review Press photographer)  Complete  HRI or Home Care Consult  Complete  SW Recovery Care/Counseling Consult  Complete  Palliative Care Screening  Not Applicable  Skilled Nursing Facility  Complete

## 2022-06-16 DIAGNOSIS — E870 Hyperosmolality and hypernatremia: Secondary | ICD-10-CM | POA: Diagnosis not present

## 2022-06-16 DIAGNOSIS — A419 Sepsis, unspecified organism: Secondary | ICD-10-CM | POA: Diagnosis not present

## 2022-06-16 LAB — BASIC METABOLIC PANEL
Anion gap: 4 — ABNORMAL LOW (ref 5–15)
BUN: 27 mg/dL — ABNORMAL HIGH (ref 8–23)
CO2: 25 mmol/L (ref 22–32)
Calcium: 8 mg/dL — ABNORMAL LOW (ref 8.9–10.3)
Chloride: 125 mmol/L — ABNORMAL HIGH (ref 98–111)
Creatinine, Ser: 1.01 mg/dL (ref 0.61–1.24)
GFR, Estimated: >60 mL/min (ref >60)
Glucose, Bld: 108 mg/dL — ABNORMAL HIGH (ref 70–99)
Potassium: 3.8 mmol/L (ref 3.5–5.1)
Sodium: 154 mmol/L — ABNORMAL HIGH (ref 135–145)

## 2022-06-16 LAB — URINE CULTURE: Culture: >=100000 — AB

## 2022-06-16 LAB — GLUCOSE, CAPILLARY
Glucose-Capillary: 106 mg/dL — ABNORMAL HIGH (ref 70–99)
Glucose-Capillary: 85 mg/dL (ref 70–99)
Glucose-Capillary: 86 mg/dL (ref 70–99)
Glucose-Capillary: 89 mg/dL (ref 70–99)
Glucose-Capillary: 93 mg/dL (ref 70–99)
Glucose-Capillary: 97 mg/dL (ref 70–99)
Glucose-Capillary: 99 mg/dL (ref 70–99)

## 2022-06-16 MED ORDER — OXYCODONE HCL 5 MG/5ML PO SOLN: 2.5000 mg | ORAL | 0 refills | Status: AC | PRN

## 2022-06-16 MED ORDER — HALOPERIDOL 1 MG PO TABS: 1.0000 mg | ORAL_TABLET | Freq: Three times a day (TID) | ORAL | 0 refills | Status: AC | PRN

## 2022-06-16 NOTE — Progress Notes (Signed)
Attempted to call report for a second time to [facility] at [phone] but no answer.

## 2022-06-16 NOTE — Discharge Summary (Signed)
Physician Discharge Summary  [name] OAC:[id number] DOB: Dec 22, 1950 DOA: 06/13/2022  PCP: Jacelyn Grip, MD  Admit date: 06/13/2022 Discharge date: 06/16/2022  Admitted From: Long-term care Disposition: Long-term care with hospice  Recommendations for Outpatient Follow-up:  As per hospice provider   Discharge Condition: Serious CODE STATUS: DNR/DNI, comfort care Diet recommendation: Comfort feeding, aspiration precautions  Discharge summary: [name] is a 71 y.o. male with past medical history significant for schizophrenia, advanced dementia, CKD stage IIIa, history of ESBL E. coli, history of paroxysmal atrial fibrillation, history of DVT, chronic diastolic congestive heart failure who presented to [facility] ED on 06/13/2022 from [facility] SN[facility] with abnormal labs.  Nursing facility reported that his sodium was high.    In the ED, temperature 101.1 F, HR 118, RR 21, BP 113/92, SPO2 98% on room air.  WBC 7.3, hemoglobin 11.8, platelet count 132.  Sodium 162, potassium 3.8, chloride 130, CO2 27, glucose 119, BUN 48, creatinine 1.71.  AST 53, ALT 49, total bilirubin 0.7.  Lipase 61, ammonia level 21.  Lactic acid 1.5.  CK9 75.  High sensitive troponin 41>45.  Urinalysis with moderate leukocytes, positive nitrite, many bacteria, greater than 50 WBCs.  Chest x-ray with no active cardiopulmonary disease process.  CT renal stone study with no renal calculus, mild fullness of the renal collecting system on the right with mild perinephric fat stranding bilaterally we may be infectious versus inflammatory, enlargement of the pancreatic head with suggestion of mild fat stranding possible pancreatitis.  Abdominal x-ray with large amount of stool throughout the colon.   Assessment & Plan of care:    Severe sepsis secondary to urinary tract infection, urine culture with ESBL. Patient presenting to the ED from SNF and notably febrile 101.1 F, tachycardic, tachypneic with endorgan damage of acute  renal failure. Foley catheter placed in the ED with purulence noted in collection bag. -- Blood cultures x2: Negative. -- Urine culture: ESBL. -- Received meropenem.  Day 3 today.  See goal of care discussion below.   Hypernatremia secondary to severe dehydration, no access to free water intake. Patient presenting with a sodium of 162, likely secondary to severe dehydration in the setting of poor oral intake, adult failure to thrive. -- Na 162>159>161> 152> 155.  No response to treatment. -- Patient without oral intake and at end-of-life.     Urinary retention Foley catheter placed in the ED. -- Continue monitor urinary output, discharge with Foley catheter for comfort.  Acute renal failure on CKD stage IIIa Etiology likely secondary to severe dehydration in the setting of adult failure to thrive, poor oral intake. --Cr 1.71>1.45>1.37>1.1   Schizophrenia Advanced dementia Failure to thrive Sacral decubitus ulcer Contractures Severe protein calorie malnutrition Able to connect with patient's family.  Including patient's wife and stepdaughter.   Patient is at end-of-life with no oral intake, severe debility and advanced dementia.  Contractures and bedbound status.  Hospice and comfort care was recommended.  Family agreed.   Transfer back to long-term nursing home with hospice in place to provide end-of-life care.     Discharge Diagnoses:  Principal Problem:   Hypernatremia Active Problems:   History of hepatitis C   PARANOID SCHIZOPHRENIA, CHRONIC   Hypertension   BPH (benign prostatic hyperplasia)   Hyperlipidemia   Diastolic heart failure (HCC)   Malnutrition of moderate degree   AKI (acute kidney injury) ([place])   Acute encephalopathy   Acute UTI   Chronic kidney disease, stage 3a ([place])  Paroxysmal atrial fibrillation (HCC)   Constipation   Elevated troponin   Pressure injury of skin   Goals of care, counseling/discussion   Counseling and coordination of care    Pain   Severe dementia [facility])   Palliative care encounter    Discharge Instructions  Discharge Instructions     Diet general   Complete by: As directed    Discharge wound care:   Complete by: As directed    Wound care to pressure injuries to sacral area: Cleanse with soap and water, rinse and dry. Apply Medihoney to open areas, top with dry gauze [id number], cover with silicone foam placed with "tip" oriented pointing away from the anus. Change daily and PRN soiling.   Increase activity slowly   Complete by: As directed       Allergies as of 06/16/2022       Reactions   Lisinopril Cough   Penicillins Hives   Amoxicillin Rash   Ibuprofen Rash        Medication List     STOP taking these medications    amLODipine 10 MG tablet Commonly known as: NORVASC   ceFEPIme 1 g injection Commonly known as: MAXIPIME   cetirizine 10 MG tablet Commonly known as: ZyrTEC Allergy   famotidine 20 MG tablet Commonly known as: PEPCID   folic acid 1 MG tablet Commonly known as: FOLVITE   multivitamin with minerals Tabs tablet   Resource 2.0 Liqd   tamsulosin 0.4 MG Caps capsule Commonly known as: FLOMAX   thiamine 100 MG tablet Commonly known as: VITAMIN B1       TAKE these medications    acetaminophen 325 MG tablet Commonly known as: TYLENOL Take 2 tablets (650 mg total) by mouth every 6 (six) hours as needed for mild pain, moderate pain or headache.   ascorbic acid 500 MG tablet Commonly known as: VITAMIN C Take 500 mg by mouth daily.   benztropine 1 MG tablet Commonly known as: COGENTIN Take 1 tablet (1 mg total) by mouth 2 (two) times daily. What changed:  when to take this additional instructions   Desitin 40 % Pste Generic drug: Zinc Oxide Apply 1 Application topically 2 (two) times daily.   haloperidol 1 MG tablet Commonly known as: HALDOL Take 1 tablet (1 mg total) by mouth every 8 (eight) hours as needed for up to 5 days for agitation.   oxyCODONE 5  MG/5ML solution Commonly known as: ROXICODONE Take 2.5-5 mLs (2.5-5 mg total) by mouth every 4 (four) hours as needed for up to 5 days for moderate pain or severe pain (dyspnea).   traZODone 50 MG tablet Commonly known as: DESYREL Take 1 tablet (50 mg total) by mouth at bedtime. What changed: Another medication with the same name was removed. Continue taking this medication, and follow the directions you see here.               Discharge Care Instructions  (From admission, onward)           Start     Ordered   06/16/22 0000  Discharge wound care:       Comments: Wound care to pressure injuries to sacral area: Cleanse with soap and water, rinse and dry. Apply Medihoney to open areas, top with dry gauze [id number], cover with silicone foam placed with "tip" oriented pointing away from the anus. Change daily and PRN soiling.   06/16/22 1354            Allergies  Allergen Reactions   Lisinopril Cough   Penicillins Hives   Amoxicillin Rash   Ibuprofen Rash    Consultations: Palliative care   Procedures/Studies: ECHOCARDIOGRAM COMPLETE  Result Date: 06/14/2022    ECHOCARDIOGRAM REPORT   Patient Name:   RAMZI BRATHWAITE Date of Exam: 06/14/2022 Medical Rec #:  [id number]     Height:       69.0 in Accession #:    [id number]    Weight:       135.0 lb Date of Birth:  01-26-1951      BSA:          1.748 m Patient Age:    89 years      BP:           99/85 mmHg Patient Gender: M             HR:           94 bpm. Exam Location:  Inpatient Procedure: 2D Echo, Cardiac Doppler and Color Doppler Indications:    Elevated Troponin  History:        Patient has prior history of Echocardiogram examinations, most                 recent 04/24/2020. Risk Factors:Hypertension and GERD.  Sonographer:    Bernadene Person RDCS Referring Phys: Geryl Rankins DOUTOVA  Sonographer Comments: Image acquisition challenging due to uncooperative patient. IMPRESSIONS  1. Abnormal septal motion. Left ventricular  ejection fraction, by estimation, is 55%. The left ventricle has normal function. The left ventricle has no regional wall motion abnormalities. Left ventricular diastolic parameters were normal.  2. Right ventricular systolic function is normal. The right ventricular size is normal.  3. The mitral valve is abnormal. No evidence of mitral valve regurgitation. No evidence of mitral stenosis.  4. The aortic valve is tricuspid. There is mild calcification of the aortic valve. There is mild thickening of the aortic valve. Aortic valve regurgitation is trivial. Aortic valve sclerosis is present, with no evidence of aortic valve stenosis.  5. The inferior vena cava is normal in size with greater than 50% respiratory variability, suggesting right atrial pressure of 3 mmHg. FINDINGS  Left Ventricle: Abnormal septal motion. Left ventricular ejection fraction, by estimation, is 55%. The left ventricle has normal function. The left ventricle has no regional wall motion abnormalities. The left ventricular internal cavity size was normal  in size. There is no left ventricular hypertrophy. Left ventricular diastolic parameters were normal. Right Ventricle: The right ventricular size is normal. No increase in right ventricular wall thickness. Right ventricular systolic function is normal. Left Atrium: Left atrial size was normal in size. Right Atrium: Right atrial size was normal in size. Pericardium: Trivial pericardial effusion is present. The pericardial effusion is localized near the right ventricle. Mitral Valve: The mitral valve is abnormal. There is mild thickening of the mitral valve leaflet(s). There is mild calcification of the mitral valve leaflet(s). No evidence of mitral valve regurgitation. No evidence of mitral valve stenosis. Tricuspid Valve: The tricuspid valve is normal in structure. Tricuspid valve regurgitation is not demonstrated. No evidence of tricuspid stenosis. Aortic Valve: The aortic valve is tricuspid.  There is mild calcification of the aortic valve. There is mild thickening of the aortic valve. Aortic valve regurgitation is trivial. Aortic valve sclerosis is present, with no evidence of aortic valve stenosis. Pulmonic Valve: The pulmonic valve was normal in structure. Pulmonic valve regurgitation is not visualized. No evidence of pulmonic stenosis. Aorta: The  aortic root is normal in size and structure. Venous: The inferior vena cava is normal in size with greater than 50% respiratory variability, suggesting right atrial pressure of 3 mmHg. IAS/Shunts: The interatrial septum was not well visualized.  LEFT VENTRICLE PLAX 2D LVIDd:         4.40 cm   Diastology LVIDs:         3.10 cm   LV e' medial:    8.16 cm/s LV PW:         0.80 cm   LV E/e' medial:  6.3 LV IVS:        1.00 cm   LV e' lateral:   7.38 cm/s LVOT diam:     2.10 cm   LV E/e' lateral: 7.0 LV SV:         46 LV SV Index:   26 LVOT Area:     3.46 cm  RIGHT VENTRICLE RV S prime:     18.20 cm/s TAPSE (M-mode): 1.6 cm LEFT ATRIUM             Index        RIGHT ATRIUM          Index LA diam:        2.70 cm 1.54 cm/m   RA Area:     6.71 cm LA Vol (A2C):   22.7 ml 12.98 ml/m  RA Volume:   10.60 ml 6.06 ml/m LA Vol (A4C):   20.8 ml 11.90 ml/m LA Biplane Vol: 21.9 ml 12.53 ml/m  AORTIC VALVE LVOT Vmax:   93.00 cm/s LVOT Vmean:  57.000 cm/s LVOT VTI:    0.133 m  AORTA Ao Root diam: 3.40 cm MITRAL VALVE MV Area (PHT): 3.12 cm    SHUNTS MV Decel Time: 243 msec    Systemic VTI:  0.13 m MV E velocity: 51.40 cm/s  Systemic Diam: 2.10 cm MV A velocity: 53.70 cm/s MV E/A ratio:  0.96 Jenkins Rouge MD Electronically signed by Jenkins Rouge MD Signature Date/Time: 06/14/2022/12:25:29 PM    Final    DG Abd 1 View  Result Date: 06/14/2022 CLINICAL DATA:  Constipation EXAM: ABDOMEN - 1 VIEW COMPARISON:  June 13, 2022 FINDINGS: The study is limited due to positioning. Moderate fecal loading throughout the colon again identified. The fecal loading in the  descending colon persists but is less prominent compared to yesterday's study. Lung bases are unremarkable. No other bony or soft tissue abnormalities are identified. IMPRESSION: Moderate fecal loading throughout the colon. The fecal loading in the descending colon is less prominent compared to yesterday's study. Electronically Signed   By: Dorise Bullion III M.D.   On: 06/14/2022 08:46   CT RENAL STONE STUDY  Result Date: 06/14/2022 CLINICAL DATA:  Neurogenic bladder dysfunction. EXAM: CT ABDOMEN AND PELVIS WITHOUT CONTRAST TECHNIQUE: Multidetector CT imaging of the abdomen and pelvis was performed following the standard protocol without IV contrast. RADIATION DOSE REDUCTION: This exam was performed according to the departmental dose-optimization program which includes automated exposure control, adjustment of the mA and/or kV according to patient size and/or use of iterative reconstruction technique. COMPARISON:  10/30/2020, 08/20/2016. FINDINGS: Lower chest: Mild atelectasis at the right lung base. Hepatobiliary: A stable subcentimeter hypodensity is present in the posterior right lobe of the liver which is too small to further characterize. No biliary ductal dilatation. The gallbladder is not visualized on exam. Pancreas: There is enlargement of the pancreatic head with surrounding fat stranding, possible pancreatitis. No pancreatic ductal dilatation. Spleen:  Normal in size without focal abnormality. Adrenals/Urinary Tract: The adrenal glands are within normal limits. No renal calculus bilaterally. There is mild fullness of the right renal pelvis and calices with no definite evidence of obstructive uropathy. The bladder is distended and otherwise within normal limits. Stomach/Bowel: Stomach is within normal limits. Appendix appears normal. No evidence of bowel wall thickening, distention, or inflammatory changes. No free air or pneumatosis. A moderate amount of retained stool is present in the colon.  Vascular/Lymphatic: Aortic atherosclerosis. No enlarged abdominal or pelvic lymph nodes. Reproductive: The prostate gland is prominent in size. Other: No abdominopelvic ascites. Musculoskeletal: No acute osseous abnormality. IMPRESSION: 1. No renal calculus. There is mild fullness of the renal collecting system on the right with mild perinephric fat stranding bilaterally which may be infectious or inflammatory. 2. Enlargement of the pancreatic head with suggestion of mild fat stranding, possible pancreatitis. Correlation with amylase and lipase is recommended. 3. Aortic atherosclerosis. Electronically Signed   By: Brett Fairy M.D.   On: 06/14/2022 01:24   DG Abd 1 View  Result Date: 06/13/2022 CLINICAL DATA:  Hypernatremia. EXAM: ABDOMEN - 1 VIEW COMPARISON:  None Available. FINDINGS: The bowel gas pattern is normal. There is a large amount of stool throughout the colon. No radio-opaque calculi seen overlying the kidneys. There are vascular calcifications in the pelvis. No acute fractures are identified. IMPRESSION: Large amount of stool throughout the colon. No evidence of bowel obstruction. Electronically Signed   By: Ronney Asters M.D.   On: 06/13/2022 23:59   DG CHEST PORT 1 VIEW  Result Date: 06/13/2022 CLINICAL DATA:  Tachycardia EXAM: PORTABLE CHEST 1 VIEW COMPARISON:  Chest x-ray 01/03/2021 FINDINGS: The heart size and mediastinal contours are within normal limits. Both lungs are clear. The visualized skeletal structures are unremarkable. IMPRESSION: No active disease. Electronically Signed   By: Ronney Asters M.D.   On: 06/13/2022 23:57   (Echo, Carotid, EGD, Colonoscopy, ERCP)    Subjective: Patient seen and examined.  Remains obtunded and unable to respond.  Blinks eyes and looks at his wife.  Unable to interact.   Discharge Exam: Vitals:   06/16/22 0857 06/16/22 1155  BP: 129/72 118/74  Pulse: 91 94  Resp: (!) 22 (!) 22  Temp: (!) 97.5 F (36.4 C) 99.4 F (37.4 C)  SpO2: 99%  99%   Vitals:   06/16/22 0407 06/16/22 0646 06/16/22 0857 06/16/22 1155  BP: 113/83 (!) 128/92 129/72 118/74  Pulse: (!) 108 91 91 94  Resp: 16 16 (!) 22 (!) 22  Temp: (!) 100.5 F (38.1 C) 98.3 F (36.8 C) (!) 97.5 F (36.4 C) 99.4 F (37.4 C)  TempSrc: Oral Oral Oral   SpO2: 96% 100% 99% 99%  Weight:      Height:        General: Pt is with open eyes.  Sick looking.  Unable to interact or follow commands.  Does answer some incomprehensible words. Cardiovascular: RRR, S1/S2 +, no rubs, no gallops Respiratory: CTA bilaterally, no wheezing, no rhonchi, conducted upper airway sounds. Abdominal: Soft, NT, ND, bowel sounds + Extremities: Contractures all extremities.  Multiple decubitus ulcers. Foley catheter with clear urine.    The results of significant diagnostics from this hospitalization (including imaging, microbiology, ancillary and laboratory) are listed below for reference.     Microbiology: Recent Results (from the past 240 hour(s))  Urine Culture     Status: Abnormal   Collection Time: 06/13/22 12:47 AM   Specimen: Urine, Clean Catch  Result Value Ref Range Status   Specimen Description   Final    URINE, CLEAN CATCH Performed at [facility], [place] [address], [place], [place] [address]    Special Requests   Final    NONE Performed at [facility], [place] [address], [place], [place] [address]    Culture (A)  Final    >=100,000 COLONIES/mL ESCHERICHIA COLI Confirmed Extended Spectrum Beta-Lactamase Producer (ESBL).  In bloodstream infections from ESBL organisms, carbapenems are preferred over piperacillin/tazobactam. They are shown to have a lower risk of mortality.    Report Status 06/16/2022 FINAL  Final   Organism ID, Bacteria ESCHERICHIA COLI (A)  Final      Susceptibility   Escherichia coli - MIC*    AMPICILLIN >=32 RESISTANT Resistant     CEFAZOLIN >=64 RESISTANT Resistant     CEFEPIME 16 RESISTANT Resistant      CEFTRIAXONE >=64 RESISTANT Resistant     CIPROFLOXACIN 0.5 INTERMEDIATE Intermediate     GENTAMICIN >=16 RESISTANT Resistant     IMIPENEM <=0.25 SENSITIVE Sensitive     NITROFURANTOIN 32 SENSITIVE Sensitive     TRIMETH/SULFA >=320 RESISTANT Resistant     AMPICILLIN/SULBACTAM 16 INTERMEDIATE Intermediate     PIP/TAZO <=4 SENSITIVE Sensitive     * >=100,000 COLONIES/mL ESCHERICHIA COLI  Culture, blood (Routine X 2) w Reflex to ID Panel     Status: None (Preliminary result)   Collection Time: 06/14/22  1:50 AM   Specimen: Right Antecubital; Blood  Result Value Ref Range Status   Specimen Description   Final    RIGHT ANTECUBITAL BLOOD Performed at [facility], [address], [place], [place] [address]    Special Requests   Final    BOTTLES DRAWN AEROBIC AND ANAEROBIC Blood Culture adequate volume Performed at [facility] [address], [place], [place] [address]    Culture   Final    NO GROWTH 2 DAYS Performed at [facility] [address] [place], [place] [address]    Report Status PENDING  Incomplete  Culture, blood (Routine X 2) w Reflex to ID Panel     Status: None (Preliminary result)   Collection Time: 06/14/22  4:05 AM   Specimen: BLOOD RIGHT FOREARM  Result Value Ref Range Status   Specimen Description   Final    BLOOD RIGHT FOREARM Performed at [facility] [address] [place], [place] [address]    Special Requests   Final    BOTTLES DRAWN AEROBIC AND ANAEROBIC Blood Culture adequate volume Performed at [facility] [address] [place], [place] [address]    Culture   Final    NO GROWTH 2 DAYS Performed at [facility] [address], [place], [place] [address]    Report Status PENDING  Incomplete  MRSA Next Gen by PCR, Nasal     Status: None   Collection Time: 06/14/22  9:15 AM   Specimen: Nasal Mucosa; Nasal Swab  Result Value Ref Range Status   MRSA by PCR Next Gen NOT  DETECTED NOT DETECTED Final    Comment: (NOTE) The GeneXpert MRSA Assay (FDA approved for NASAL specimens only), is one component of a comprehensive MRSA colonization surveillance program. It is not intended to diagnose MRSA infection nor to guide or monitor treatment for MRSA infections. Test performance is not FDA approved in patients less than 21 years old. Performed at [facility],  [place] [address] [place], [place] [address]      Labs: BNP (last 3 results) No results for input(s): "BNP" in the last 8760 hours. Basic Metabolic Panel: Recent Labs  Lab 06/13/22 2350 06/14/22 0200 06/14/22 0927 06/14/22 1508 06/14/22 1826 06/14/22 2003 06/15/22 0301 06/15/22 0901 06/16/22 0531  NA  --    < >  --    < > 159* 158* 155* 152* 154*  K  --    < >  --    < > 4.9 3.4* 3.7 3.2* 3.8  CL  --    < >  --    < > 130* 127* 126* 123* 125*  CO2  --    < >  --    < > '24 26 25 25 25  '$ GLUCOSE  --    < >  --    < > 136* 141* 128* 120* 108*  BUN  --    < >  --    < > 40* 40* 36* 35* 27*  CREATININE  --    < >  --    < > 1.23 1.16 1.17 1.11 1.01  CALCIUM  --    < >  --    < > 8.1* 8.3* 8.0* 7.9* 8.0*  MG 2.6*  --  2.5*  --   --   --   --   --   --   PHOS 3.5  --  3.2  --   --   --   --   --   --    < > = values in this interval not displayed.   Liver Function Tests: Recent Labs  Lab 06/13/22 2105  AST 53*  ALT 49*  ALKPHOS 87  BILITOT 0.7  PROT 7.1  ALBUMIN 2.6*   Recent Labs  Lab 06/14/22 0200  LIPASE 61*   Recent Labs  Lab 06/13/22 2350  AMMONIA 21   CBC: Recent Labs  Lab 06/13/22 2105 06/14/22 0927  WBC 7.3 7.2  NEUTROABS 5.8  --   HGB 11.8* 10.1*  HCT 39.0 33.4*  MCV 94.4 95.4  PLT 132* 120*   Cardiac Enzymes: Recent Labs  Lab 06/13/22 2350 06/14/22 0511  CKTOTAL 975* 968*   BNP: Invalid input(s): "POCBNP" CBG: Recent Labs  Lab 06/15/22 2335 06/16/22 0146 06/16/22 0403 06/16/22 0736 06/16/22 1147  GLUCAP 105* 106* 86 99 93    D-Dimer No results for input(s): "DDIMER" in the last 72 hours. Hgb A1c Recent Labs    06/14/22 0927  HGBA1C 5.9*   Lipid Profile No results for input(s): "CHOL", "HDL", "LDLCALC", "TRIG", "CHOLHDL", "LDLDIRECT" in the last 72 hours. Thyroid function studies Recent Labs    06/14/22 0511  TSH 2.310   Anemia work up No results for input(s): "VITAMINB12", "FOLATE", "FERRITIN", "TIBC", "IRON", "RETICCTPCT" in the last 72 hours. Urinalysis    Component Value Date/Time   COLORURINE YELLOW 06/13/2022 2117   APPEARANCEUR CLEAR 06/13/2022 2117   LABSPEC 1.025 06/13/2022 2117   PHURINE 6.0 06/13/2022 2117   GLUCOSEU NEGATIVE 06/13/2022 2117   HGBUR SMALL (A) 06/13/2022 2117   HGBUR negative 08/27/2010 0854   BILIRUBINUR NEGATIVE 06/13/2022 2117   BILIRUBINUR negative 08/02/2018 1555   BILIRUBINUR NEG 07/27/2014 [place] 06/13/2022 2117   PROTEINUR 30 (A) 06/13/2022 2117   UROBILINOGEN 0.2 08/02/2018 1555   UROBILINOGEN 0.2 08/27/2010 0854   NITRITE POSITIVE (A) 06/13/2022 2117   LEUKOCYTESUR MODERATE (A) 06/13/2022 2117  Sepsis Labs Recent Labs  Lab 06/13/22 2105 06/14/22 0927  WBC 7.3 7.2   Microbiology Recent Results (from the past 240 hour(s))  Urine Culture     Status: Abnormal   Collection Time: 06/13/22 12:47 AM   Specimen: Urine, Clean Catch  Result Value Ref Range Status   Specimen Description   Final    URINE, CLEAN CATCH Performed at [facility] [address] [place], [place] [address]    Special Requests   Final    NONE Performed at [facility], [place] [address] [place], [place] [address]    Culture (A)  Final    >=100,000 COLONIES/mL ESCHERICHIA COLI Confirmed Extended Spectrum Beta-Lactamase Producer (ESBL).  In bloodstream infections from ESBL organisms, carbapenems are preferred over piperacillin/tazobactam. They are shown to have a lower risk of mortality.    Report Status 06/16/2022  FINAL  Final   Organism ID, Bacteria ESCHERICHIA COLI (A)  Final      Susceptibility   Escherichia coli - MIC*    AMPICILLIN >=32 RESISTANT Resistant     CEFAZOLIN >=64 RESISTANT Resistant     CEFEPIME 16 RESISTANT Resistant     CEFTRIAXONE >=64 RESISTANT Resistant     CIPROFLOXACIN 0.5 INTERMEDIATE Intermediate     GENTAMICIN >=16 RESISTANT Resistant     IMIPENEM <=0.25 SENSITIVE Sensitive     NITROFURANTOIN 32 SENSITIVE Sensitive     TRIMETH/SULFA >=320 RESISTANT Resistant     AMPICILLIN/SULBACTAM 16 INTERMEDIATE Intermediate     PIP/TAZO <=4 SENSITIVE Sensitive     * >=100,000 COLONIES/mL ESCHERICHIA COLI  Culture, blood (Routine X 2) w Reflex to ID Panel     Status: None (Preliminary result)   Collection Time: 06/14/22  1:50 AM   Specimen: Right Antecubital; Blood  Result Value Ref Range Status   Specimen Description   Final    RIGHT ANTECUBITAL BLOOD Performed at [facility], [address] [place], [place] [address]    Special Requests   Final    BOTTLES DRAWN AEROBIC AND ANAEROBIC Blood Culture adequate volume Performed at [facility] [address], [place], [place] [address]    Culture   Final    NO GROWTH 2 DAYS Performed at [facility] [address] [place], [place] [address]    Report Status PENDING  Incomplete  Culture, blood (Routine X 2) w Reflex to ID Panel     Status: None (Preliminary result)   Collection Time: 06/14/22  4:05 AM   Specimen: BLOOD RIGHT FOREARM  Result Value Ref Range Status   Specimen Description   Final    BLOOD RIGHT FOREARM Performed at [facility] [address], [place], [place] [address]    Special Requests   Final    BOTTLES DRAWN AEROBIC AND ANAEROBIC Blood Culture adequate volume Performed at [facility] [address], [place], [place] [address]    Culture   Final    NO GROWTH 2 DAYS Performed at [facility] [address],  [place], [place] [address]    Report Status PENDING  Incomplete  MRSA Next Gen by PCR, Nasal     Status: None   Collection Time: 06/14/22  9:15 AM   Specimen: Nasal Mucosa; Nasal Swab  Result Value Ref Range Status   MRSA by PCR Next Gen NOT DETECTED NOT DETECTED Final    Comment: (NOTE) The GeneXpert MRSA Assay (FDA approved for NASAL specimens  only), is one component of a comprehensive MRSA colonization surveillance program. It is not intended to diagnose MRSA infection nor to guide or monitor treatment for MRSA infections. Test performance is not FDA approved in patients less than 29 years old. Performed at [facility], [facility] [address] [place], [place] [address]      Time coordinating discharge: 40 minutes  SIGNED:   Barb Merino, MD  [facility] 06/16/2022, 1:55 PM

## 2022-06-16 NOTE — Progress Notes (Signed)
   06/16/22 0407  Assess: MEWS Score  Temp (!) 100.5 F (38.1 C)  BP 113/83  MAP (mmHg) 90  Pulse Rate (!) 108  Resp 16  SpO2 96 %  Assess: MEWS Score  MEWS Temp 1  MEWS Systolic 0  MEWS Pulse 1  MEWS RR 0  MEWS LOC 1  MEWS Score 3  MEWS Score Color Yellow  Assess: if the MEWS score is Yellow or Red  Were vital signs taken at a resting state? Yes  Focused Assessment No change from prior assessment  Does the patient meet 2 or more of the SIRS criteria? Yes  Does the patient have a confirmed or suspected source of infection? Yes  Provider and Rapid Response Notified? Yes  MEWS guidelines implemented *See Row Information* Yes  Treat  MEWS Interventions Administered prn meds/treatments;Other (Comment) (tylenol suppository admin at 0331 ice packs applied)  Pain Scale Faces  Faces Pain Scale 0  Take Vital Signs  Increase Vital Sign Frequency  Yellow: Q 2hr X 2 then Q 4hr X 2, if remains yellow, continue Q 4hrs  Escalate  MEWS: Escalate Yellow: discuss with charge nurse/RN and consider discussing with provider and RRT  Notify: Charge Nurse/RN  Name of Charge Nurse/RN Notified Ariel, RN  Date Charge Nurse/RN Notified 06/16/22  Time Charge Nurse/RN Notified 0410  Notify: Provider  Provider Name/Title Olena Heckle, NP  Date Provider Notified 06/16/22  Time Provider Notified 0410  Method of Notification Page  Notification Reason Other (Comment) (mews yellow d/t HR and temp)  Provider response No new orders;Other (Comment) (instructed to apply ice packs and cool the room)  Date of Provider Response 06/16/22  Time of Provider Response 0410  Document  Patient Outcome Stabilized after interventions  Progress note created (see row info) Yes  Assess: SIRS CRITERIA  SIRS Temperature  0  SIRS Pulse 1  SIRS Respirations  0  SIRS WBC 1  SIRS Score Sum  2   Pt's mews yellow d/t elevated HR and temp. MD and charge RN notified. Tylenol suppository admin x1. Ice packs applied and room  temp cooled. Yellow mews protocol initiated. Plan of care ongoing.

## 2022-06-16 NOTE — Progress Notes (Signed)
Daily Progress Note   Patient Name: [name]       Date: 06/16/2022 DOB: [birth year]  Age: 71 y.o. MRN#: [id number] Attending Physician: Barb Merino, MD Primary Care Physician: Jacelyn Grip, MD Admit Date: 06/13/2022 Length of Stay: 3 days  Reason for Consultation/Follow-up: Establishing goals of care  Subjective:   CC:.  Patient unable to participate in complex medical decision making due to his underlying medical status.  Following up regarding establishing goals of care.  Subjective:  Resting in bed, remains lethargic, opens eyes but does not respond, nonverbal, not noted to be in distress or discomfort, wife at bedside, discussed with her about patient's current condition and our recommendations for hospice and she is in agreement.   Review of Systems  Objective:   Vital Signs:  BP 118/74 (BP Location: Right Arm)   Pulse 94   Temp 99.4 F (37.4 C)   Resp (!) 22   Ht '5\' 9"'$  (1.753 m)   Wt 55.2 kg   SpO2 99%   BMI 17.97 kg/m   Physical Exam: General: lethargic, cachetic, frail, contracted limbs Eyes: no drainage noted HENT: dry mucous membranes with dried caking and poor dentition Cardiovascular: RRR Respiratory: no increased work of breathing noted, not in respiratory distress Abdomen: not distended Extremities: no edema in LE b/l Skin: lethargic, nonverbal   Assessment & Plan:   Assessment: Patient is a 71 year old male with past medical history of schizophrenia, advanced dementia, CKD stage III, history of ESBL, history of paroxysmal A-fib, history of DVT, and HFpEF who was admitted on 06/13/2022 from [facility] SNF for abnormal lab work.  During admission, patient receiving work-up and support for severe sepsis secondary to urinary tract infection in setting of history of ESBL and urinary retention, per natremia secondary to severe dehydration, severe constipation, and AKI on CKD.  Palliative care consulted to assist with complex medical decision making.    Recommendations/Plan: # Complex medical decision making/goals of care:     -Patient unable to participate in complex medical decision making due to his underlying medical status.                -Patient's wife present at bedside, they have a 33 year old daughter together, patient's wife is aware of the serious irreversible nature of the patient's illnesses                 -  Code Status: DNR/DNI   -Patient is at the end of his life in the setting of severe systemic disease with end-stage dementia,  inability to eat/drink, incontinence, inability to communicate, multiple sites of skin failure, and bedbound status, any attempt at CPR/intubation would be ineffective and/or harmful to the patient in the setting that there is no reasonable likelihood that it would achieve meaningful medical benefit for the patient. Code status has been appropriately changed to DNR/DNI based on two-physician agreement.  Prognosis: days to weeks   -needs hospice care  # Symptom management:   -Constipation                               -Agree with aggressive symptom management for constipation including enema and suppository use as needed.                  -Pain, patient likely having underlying pain in setting of contractures with multiple skin wounds                               -  Continue oxycodone solution 2.5-'5mg'$  q4hrs prn. which can be absorbed buccally.   # Psycho-social/Spiritual Support:  - Support System: Unknown -Discussed care with chaplain   # Discharge Planning: Likely return to long term care facility; recommend with hospice support for end of life care  Discussed with: Wife at bedside hospitalist  Thank you for allowing the palliative care team to participate in the care [name]. Mod MDM.  Loistine Chance MD Palliative Care Provider PMT # [phone]  If patient remains symptomatic despite maximum doses, please call PMT at [phone] between 0700 and 1900. Outside of these hours, please  call attending, as PMT does not have night coverage.

## 2022-06-16 NOTE — Progress Notes (Signed)
Attempted to call report to [facility] at [phone] but no answer. Will attempt to try again later. Plan of care ongoing.

## 2022-06-16 NOTE — Progress Notes (Signed)
SLP Cancellation Note  Patient Details Name: [name] MRN: [id number] DOB: 04/27/1951   Cancelled treatment:       Reason Eval/Treat Not Completed: Other (comment) (pt now comfort care, will sign off) Kathleen Lime, MS [facility] SLP Acute Rehab Services Office [phone] Pager [phone]   Macario Golds 06/16/2022, 2:09 PM

## 2022-06-16 NOTE — Progress Notes (Signed)
Pt transported via [facility] to [facility]. All belongings with pt. Pt resting comfortably.

## 2022-06-16 NOTE — TOC Transition Note (Addendum)
Transition of Care [facility]) - CM/SW Discharge Note   Patient Details  Name: [name] MRN: [id number] Date of Birth: 09-15-1950  [facility] CM/SW Contact:  Vassie Moselle, LCSW Phone Number: 06/16/2022, 2:00 PM   Clinical Narrative:    Pt is to return to [facility] where he is a LTC resident. Pt will be transported to [facility] via [facility]. Pt will be returning to room 105. RN to call report to [phone] and request RN on [facility].  [facility] has been called for transportation at 2:20pm.  Pt will be returning under hospice care through [facility]. Referral and discharge summary has been faxed to [facility] at [phone].  CSW spoke with pt's spouse at bedside and confirmed plan for hospice services. CSW provided contact information for [facility] and encouraged her to call with any questions and to sign pt into hospice services. Pt's spouse provided contact information for their daughter Joannie [place], [phone]. CSW contacted their daughter and confirmed plan with her. Pt's daughter was able to verbalize understanding of discharge plan.     Final next level of care: Assisted Living Barriers to Discharge: No Barriers Identified   Patient Goals and CMS Choice Patient states their goals for this hospitalization and ongoing recovery are:: Unable to obtain   Choice offered to / list presented to : Spouse, Adult Children  Discharge Placement              Patient chooses bed at: [facility] Patient to be transferred to facility by: [facility] Name of family member notified: Spouse, Senay Sistrunk and daughter, Joannie [place] Patient and family notified of of transfer: 06/16/22  Discharge Plan and Services In-house Referral: Hospice / Palliative Care Discharge Planning Services: CM Consult Post Acute Care Choice: Resumption of Svcs/PTA Provider, Hospice          DME Arranged: N/A DME Agency: NA                  Social Determinants of Health ([place]) Interventions      Readmission Risk Interventions    06/16/2022    1:59 PM 06/15/2022    1:50 PM 11/28/2020   12:21 PM  Readmission Risk Prevention Plan  Transportation Screening Complete Complete Complete  PCP or Specialist Appt within 5-7 Days Complete Complete   Home Care Screening Complete Complete   Medication Review (RN CM) Complete Complete   Medication Review (RN Care Manager)   Complete  HRI or Home Care Consult   Complete  SW Recovery Care/Counseling Consult   Complete  Palliative Care Screening   Not Applicable  Skilled Nursing Facility   Complete

## 2022-06-19 LAB — HCV AB W REFLEX TO QUANT PCR: HCV Ab: REACTIVE — AB

## 2022-06-19 LAB — CULTURE, BLOOD (ROUTINE X 2)
Culture: NO GROWTH
Culture: NO GROWTH
Special Requests: ADEQUATE
Special Requests: ADEQUATE

## 2022-06-19 LAB — HCV RT-PCR, QUANT (NON-GRAPH): Hepatitis C Quantitation: NOT DETECTED IU/mL

## 2022-06-20 LAB — VITAMIN B1: Vitamin B1 (Thiamine): 130.7 nmol/L (ref 66.5–200.0)
